# Patient Record
Sex: Male | Born: 1970
Health system: Southern US, Community
[De-identification: ages and names within clinical notes are randomized; demographics above are authoritative.]

## PROBLEM LIST (undated history)

## (undated) DIAGNOSIS — E785 Hyperlipidemia, unspecified: Secondary | ICD-10-CM

## (undated) DIAGNOSIS — I10 Essential (primary) hypertension: Secondary | ICD-10-CM

## (undated) DIAGNOSIS — J40 Bronchitis, not specified as acute or chronic: Secondary | ICD-10-CM

## (undated) DIAGNOSIS — G4730 Sleep apnea, unspecified: Secondary | ICD-10-CM

## (undated) DIAGNOSIS — Z95810 Presence of automatic (implantable) cardiac defibrillator: Secondary | ICD-10-CM

## (undated) DIAGNOSIS — F329 Major depressive disorder, single episode, unspecified: Secondary | ICD-10-CM

## (undated) DIAGNOSIS — U071 COVID-19: Secondary | ICD-10-CM

## (undated) DIAGNOSIS — I509 Heart failure, unspecified: Secondary | ICD-10-CM

## (undated) DIAGNOSIS — H9193 Unspecified hearing loss, bilateral: Secondary | ICD-10-CM

## (undated) DIAGNOSIS — E119 Type 2 diabetes mellitus without complications: Secondary | ICD-10-CM

## (undated) DIAGNOSIS — N289 Disorder of kidney and ureter, unspecified: Secondary | ICD-10-CM

## (undated) DIAGNOSIS — N186 End stage renal disease: Secondary | ICD-10-CM

## (undated) DIAGNOSIS — K859 Acute pancreatitis without necrosis or infection, unspecified: Secondary | ICD-10-CM

## (undated) DIAGNOSIS — I428 Other cardiomyopathies: Secondary | ICD-10-CM

## (undated) DIAGNOSIS — F32A Depression, unspecified: Secondary | ICD-10-CM

## (undated) HISTORY — PX: CARDIAC CATHETERIZATION: SHX172

## (undated) HISTORY — PX: BACK SURGERY: SHX140

## (undated) HISTORY — DX: Other cardiomyopathies: I42.8

## (undated) HISTORY — DX: Sleep apnea, unspecified: G47.30

## (undated) HISTORY — PX: WRIST SURGERY: SHX841

## (undated) HISTORY — DX: Presence of automatic (implantable) cardiac defibrillator: Z95.810

## (undated) HISTORY — DX: Unspecified hearing loss, bilateral: H91.93

## (undated) NOTE — *Deleted (*Deleted)
***In Progress*** PCP: Cammie Sickle Cardiology: Dr. Aundra Dubin  HPI:  10 y.o. with deafness, HTN, DM, and chronic systolic CHF presents for followup of CHF.  Patient had no known cardiac problems until 10/16.  At that time, he developed exertional dyspnea and was admitted to a hospital in [place], [place] with acute systolic CHF.  Echo 10/16 showed EF 15-20%.  Cardiac cath showed no significant coronary disease.  He was diuresed and started on cardiac meds. Subsequently, he moved to [facility].  Now working in a [employer].  Repeat echo in 5/18 showed persistently low EF, 25-30%.  He had [place] placed.   He was admitted briefly with CHF exacerbation in 8/20.   Echo in 9/20 showed EF 30-35% with diffuse hypokinesis.   Ultrasound in 4/21 showed right leg DVT and CT showed a PE.  No obvious trigger (no long trip, no surgery, no prolonged immobility).  He was started on apixaban.   He quit taking his meds in 8/21 and was drinking heavily due to depression, later restarted meds.  In 10/21, he was admitted with DKA and atrial fibrillation/RVR (1st known episode).  He converted to NSR spontaneously.  [name] was stopped due to AKI.  He had melena/hematochezia and EGD showed gastritis/esophagitis.  Apixaban was stopped.  He went home but then was re-admitted later in 10/21 with bilateral PEs.   Echo in 10/21 showed EF 45%, mild global hypokinesis, moderate LVH, mildly decreased RV systolic function.   Recently presented to HF Clinic on 02/20/20 with APP Clinic. Was seen the previous week by Dr. Aundra Dubin on 02/13/20 and was found to be fluid overloaded. Weight was up 4 lbs and he was symptomatic w/ exertional dyspnea and orthopnea. [name] was restarted at 24-26 mg BID and furosemide was increased to 40 mg BID. He presented back to clinic for follow up. Sign language interpreter was used.  He reported some improvement in symptoms, able to walk on flat ground w/o dyspnea but still struggled  up inclines. LEE had resolved. Weight was down 7 lbs and His ReDs clip reading was WNL at 32%. BP was well controlled at 114/78. He was tolerating Entresto ok. He had occasional mild positional dizziness when he stands up but quickly resolves. No syncope/ near syncope.   Today he returns to HF clinic for pharmacist medication titration. At last visit with APP, Furosemide was decreased to 40 mg every AM and 20 mg every PM due to Scr bump on labs. Repeat labs were obtained 2 weeks later and Scr continued to rise, suggesting dehydration, so furosemide was decreased to 40 mg daily and Entresto was discontinued.     HF Medications: Carvedilol 25 mg BID Digoxin 0.125 mg daily Hydralazine 37.5 mg TID Isosorbide mononitrate 30 mg daily Furosemide 40 mg daily  Has the patient been experiencing any side effects to the medications prescribed?  Yes - has been dizzy and fatigued since restarting HF medications. Also complains of nausea.   Does the patient have any problems obtaining medications due to transportation or finances?   No - has [employer]  Understanding of regimen: fair Understanding of indications: fair Potential of compliance: fair Patient understands to avoid NSAIDs. Patient understands to avoid decongestants.    Pertinent Lab Values: . Serum creatinine 1.63, BUN 18, Potassium 4.5, Sodium 133  Vital Signs: . Weight: 217.4 lbs (last clinic weight: 229.8 lbs) . Blood pressure: 104/78  . Heart rate: 84   Assessment: 1. Chronic systolic CHF: Nonischemic cardiomyopathy, etiology uncertain.  Consider prior ETOH or HTN.  He has a [place].  cMRI in 1/18 with EF 27%, RV normal, no evidence of infiltrative disease, myocarditis, or MI.  Echo in 9/20 showed EF 30-35%.  Echo in 10/21 showed EF up to 45%.  - NYHA II, euvolemic on exam - Continue Entresto 24-26 mg bid. Will hold off on titration w/ soft BP and positional dizziness - Continue furosemide 40 mg daily.  -  Continue carvedilol 25 mg BID      - He is off spironolactone for now with prior hyperkalemia.  May consider rechallenge in future with use of Lokelma or Veltassa.  - Continue hydralazine 37.5 mg TID and Imdur 30 daily. - avoid SGLT2i for now given Hgb A1c >10 (11.0 on 02/01/20) and recent admission for DKA  2. Type II diabetes: management per PCP.  On insulin. Would benefit from SGLT2 inhibitor in the future if his Hgb A1c drops below 10. Would avoid for now given increased risk for GU infections and recent admission for DKA 3. HTN: controlled on current regimen.  - No changes made today  4. Deafness: Sign language interpreter present at visit.  5. CKD: Stage 3.  Last SCr 2.68.  6. PE/DVT: 4/21.  Unclear cause, no prolonged trip, prolonged immobility, or history of malignancy. No FH clotting. Recurrent PE in 10/21 while Eliquis on hold with bleeding from esophagitis/gastritis.  Do no think this was an Eliquis failure.  - Continue Eliquis, think he will need long-term with no clear trigger for VTE.  7. Hyperlipidemia: LDL very high when last checked, not clear that he had been taking statin.  Goal LDL < 70 with diabetes.  - continue atorvastatin 80 mg daily (paramedicine now helping w/ med compliance)  - repeat FLP in 6 weeks, if not at goal add Zetia +/- [id number]  8. Atrial fibrillation: First noted in setting of DKA in 10/21.  He is now on amiodarone and is in NSR today.  - Continue amiodarone  200 mg daily.  - Not ideal to continue amiodarone long-term, will likely stop in the future if no further AF and arrange for ablation if it recurs.  - Continue Eliquis.  9. ETOH abuse: Encouraged him to quit.  ETOH may play a role in both cardiomyopathy and atrial fibrillation.   10. Noncompliance: Significant problems with noncompliance around medication use.   - paramedicine now following, appreciate their assistance    Plan: 1) Medication changes: Based on clinical presentation, vital signs and  recent labs will ***  2) Follow-up: ***   Audry Riles, PharmD, BCPS, BCCP, CPP Heart Failure Clinic Pharmacist [phone]

---

## 2011-05-12 DIAGNOSIS — F419 Anxiety disorder, unspecified: Secondary | ICD-10-CM | POA: Insufficient documentation

## 2011-05-12 DIAGNOSIS — E785 Hyperlipidemia, unspecified: Secondary | ICD-10-CM | POA: Insufficient documentation

## 2011-05-12 DIAGNOSIS — E781 Pure hyperglyceridemia: Secondary | ICD-10-CM | POA: Insufficient documentation

## 2011-05-12 DIAGNOSIS — G4733 Obstructive sleep apnea (adult) (pediatric): Secondary | ICD-10-CM | POA: Insufficient documentation

## 2011-10-25 DIAGNOSIS — Z8619 Personal history of other infectious and parasitic diseases: Secondary | ICD-10-CM | POA: Insufficient documentation

## 2011-12-19 DIAGNOSIS — E559 Vitamin D deficiency, unspecified: Secondary | ICD-10-CM | POA: Insufficient documentation

## 2015-01-21 DIAGNOSIS — J40 Bronchitis, not specified as acute or chronic: Secondary | ICD-10-CM | POA: Insufficient documentation

## 2015-01-21 DIAGNOSIS — E785 Hyperlipidemia, unspecified: Secondary | ICD-10-CM | POA: Insufficient documentation

## 2015-12-13 ENCOUNTER — Telehealth: Payer: Self-pay | Admitting: Cardiology

## 2015-12-13 ENCOUNTER — Emergency Department (HOSPITAL_COMMUNITY)
Admission: EM | Admit: 2015-12-13 | Discharge: 2015-12-13 | Disposition: A | Discharge status: Home / Self Care | Payer: Medicaid - Out of State | Attending: Emergency Medicine | Admitting: Emergency Medicine

## 2015-12-13 ENCOUNTER — Emergency Department (HOSPITAL_COMMUNITY): Payer: Medicaid - Out of State

## 2015-12-13 ENCOUNTER — Encounter (HOSPITAL_COMMUNITY): Payer: Self-pay | Admitting: Emergency Medicine

## 2015-12-13 DIAGNOSIS — I509 Heart failure, unspecified: Secondary | ICD-10-CM | POA: Insufficient documentation

## 2015-12-13 DIAGNOSIS — M7989 Other specified soft tissue disorders: Secondary | ICD-10-CM | POA: Diagnosis present

## 2015-12-13 DIAGNOSIS — Z79899 Other long term (current) drug therapy: Secondary | ICD-10-CM | POA: Diagnosis not present

## 2015-12-13 DIAGNOSIS — Z7982 Long term (current) use of aspirin: Secondary | ICD-10-CM | POA: Insufficient documentation

## 2015-12-13 DIAGNOSIS — E1165 Type 2 diabetes mellitus with hyperglycemia: Secondary | ICD-10-CM | POA: Insufficient documentation

## 2015-12-13 DIAGNOSIS — R739 Hyperglycemia, unspecified: Secondary | ICD-10-CM

## 2015-12-13 DIAGNOSIS — R600 Localized edema: Secondary | ICD-10-CM | POA: Diagnosis not present

## 2015-12-13 DIAGNOSIS — I110 Hypertensive heart disease with heart failure: Secondary | ICD-10-CM | POA: Diagnosis not present

## 2015-12-13 HISTORY — DX: Type 2 diabetes mellitus without complications: E11.9

## 2015-12-13 HISTORY — DX: Essential (primary) hypertension: I10

## 2015-12-13 HISTORY — DX: Heart failure, unspecified: I50.9

## 2015-12-13 HISTORY — DX: Hyperlipidemia, unspecified: E78.5

## 2015-12-13 HISTORY — DX: Bronchitis, not specified as acute or chronic: J40

## 2015-12-13 LAB — CBC WITH DIFFERENTIAL/PLATELET
BASOS ABS: 0 10*3/uL (ref 0.0–0.1)
Basophils Relative: 0 %
Eosinophils Absolute: 0.1 10*3/uL (ref 0.0–0.7)
Eosinophils Relative: 2 %
HEMATOCRIT: 34.8 % — AB (ref 39.0–52.0)
HEMOGLOBIN: 11.3 g/dL — AB (ref 13.0–17.0)
LYMPHS PCT: 30 %
Lymphs Abs: 1.4 10*3/uL (ref 0.7–4.0)
MCH: 28.3 pg (ref 26.0–34.0)
MCHC: 32.5 g/dL (ref 30.0–36.0)
MCV: 87 fL (ref 78.0–100.0)
Monocytes Absolute: 0.4 10*3/uL (ref 0.1–1.0)
Monocytes Relative: 8 %
NEUTROS ABS: 2.8 10*3/uL (ref 1.7–7.7)
Neutrophils Relative %: 60 %
Platelets: 219 10*3/uL (ref 150–400)
RBC: 4 MIL/uL — ABNORMAL LOW (ref 4.22–5.81)
RDW: 14.2 % (ref 11.5–15.5)
WBC: 4.6 10*3/uL (ref 4.0–10.5)

## 2015-12-13 LAB — COMPREHENSIVE METABOLIC PANEL
ALK PHOS: 75 U/L (ref 38–126)
ALT: 56 U/L (ref 17–63)
AST: 40 U/L (ref 15–41)
Albumin: 3.7 g/dL (ref 3.5–5.0)
Anion gap: 9 (ref 5–15)
BILIRUBIN TOTAL: 0.6 mg/dL (ref 0.3–1.2)
BUN: 12 mg/dL (ref 6–20)
CALCIUM: 8.6 mg/dL — AB (ref 8.9–10.3)
CO2: 24 mmol/L (ref 22–32)
CREATININE: 0.92 mg/dL (ref 0.61–1.24)
Chloride: 108 mmol/L (ref 101–111)
GFR calc Af Amer: >60 mL/min (ref >60)
Glucose, Bld: 329 mg/dL — ABNORMAL HIGH (ref 65–99)
POTASSIUM: 4.5 mmol/L (ref 3.5–5.1)
Sodium: 141 mmol/L (ref 135–145)
TOTAL PROTEIN: 6.6 g/dL (ref 6.5–8.1)

## 2015-12-13 LAB — I-STAT TROPONIN, ED: TROPONIN I, POC: 0.03 ng/mL (ref 0.00–0.08)

## 2015-12-13 LAB — BRAIN NATRIURETIC PEPTIDE: B Natriuretic Peptide: 476.8 pg/mL — ABNORMAL HIGH (ref 0.0–100.0)

## 2015-12-13 MED ORDER — POTASSIUM CHLORIDE ER 10 MEQ PO TBCR: 10.0000 meq | EXTENDED_RELEASE_TABLET | Freq: Every day | ORAL | 1 refills | Status: DC

## 2015-12-13 MED ORDER — METFORMIN HCL 500 MG PO TABS: 500.0000 mg | ORAL_TABLET | Freq: Two times a day (BID) | ORAL | 1 refills | Status: DC

## 2015-12-13 MED ORDER — FUROSEMIDE 20 MG PO TABS: 20.0000 mg | ORAL_TABLET | Freq: Every day | ORAL | 1 refills | Status: DC

## 2015-12-13 MED ORDER — FUROSEMIDE 10 MG/ML IJ SOLN
60.0000 mg | Freq: Once | INTRAMUSCULAR | Status: AC
  Administered 2015-12-13: 60 mg via INTRAVENOUS
  Filled 2015-12-13: qty 8

## 2015-12-13 MED ORDER — LISINOPRIL 2.5 MG PO TABS: 2.5000 mg | ORAL_TABLET | Freq: Every day | ORAL | 1 refills | Status: DC

## 2015-12-13 MED ORDER — CARVEDILOL 3.125 MG PO TABS: 3.1250 mg | ORAL_TABLET | Freq: Two times a day (BID) | ORAL | 1 refills | Status: DC

## 2015-12-13 NOTE — ED Notes (Signed)
Pt was walked through hall with pulse oximeter on, read 100% sats whole time.

## 2015-12-13 NOTE — ED Provider Notes (Signed)
[facility] DEPT Provider Note   CSN: [id number] Arrival date & time: 12/13/15  1459     History   Chief Complaint Chief Complaint  Patient presents with  . Leg Swelling    HPI [facility] is a 45 y.o. male.  HPI [facility] is a 45 y.o. male with history of congestive heart failure diabetes, hypertension, presents to emergency department complaining of shortness of breath and leg swelling. Patient states that he recently moved to [place] from [place], approximately 3 months ago, since then he has been out of his medications. He does not have a primary care doctor in this area at this time. He states he hasn't noticed any swelling to his legs until this afternoon when he got back from church. He has noticed some shortness of breath and difficulty laying down flat for the last several weeks. He denies any chest pain. No fever or chills. No pain in his legs. No injuries. Has not tried any treatment prior to coming in.  Past Medical History:  Diagnosis Date  . Bronchitis   . CHF (congestive heart failure) ([place])   . Diabetes mellitus without complication ([place])   . Dyslipidemia   . Hypertension     There are no active problems to display for this patient.   No past surgical history on file.     Home Medications    Prior to Admission medications   Not on File    Family History No family history on file.  Social History Social History  Substance Use Topics  . Smoking status: Not on file  . Smokeless tobacco: Not on file  . Alcohol use Not on file     Allergies   Review of patient's allergies indicates no known allergies.   Review of Systems Review of Systems  Constitutional: Negative for chills and fever.  Respiratory: Positive for shortness of breath. Negative for cough and chest tightness.   Cardiovascular: Positive for leg swelling. Negative for chest pain and palpitations.  Gastrointestinal: Negative for abdominal distention, abdominal pain, diarrhea,  nausea and vomiting.  Genitourinary: Negative for dysuria, frequency, hematuria and urgency.  Musculoskeletal: Negative for arthralgias, myalgias, neck pain and neck stiffness.  Skin: Negative for rash.  Allergic/Immunologic: Negative for immunocompromised state.  Neurological: Negative for dizziness, weakness, light-headedness, numbness and headaches.  All other systems reviewed and are negative.    Physical Exam Updated Vital Signs BP 136/100 (BP Location: Left Arm)   Pulse 113   Temp 99.1 F (37.3 C) (Oral)   Resp 18   SpO2 96%   Physical Exam  Constitutional: He appears well-developed and well-nourished. No distress.  HENT:  Head: Normocephalic and atraumatic.  Eyes: Conjunctivae are normal.  Neck: Neck supple.  Cardiovascular: Normal rate, regular rhythm and normal heart sounds.   Pulmonary/Chest: Effort normal. No respiratory distress. He has no wheezes. He has no rales.  Abdominal: Bowel sounds are normal. There is no tenderness. There is no rebound.  Musculoskeletal:  2+ pitting edema up to the knees bilaterally. DP pulses intact. No erythema or warmth to the touch.   Neurological: He is alert.  Skin: Skin is warm and dry.  Nursing note and vitals reviewed.    ED Treatments / Results  Labs (all labs ordered are listed, but only abnormal results are displayed) Labs Reviewed  CBC WITH DIFFERENTIAL/PLATELET - Abnormal; Notable for the following:       Result Value   RBC 4.00 (*)    Hemoglobin 11.3 (*)  HCT 34.8 (*)    All other components within normal limits  COMPREHENSIVE METABOLIC PANEL - Abnormal; Notable for the following:    Glucose, Bld 329 (*)    Calcium 8.6 (*)    All other components within normal limits  BRAIN NATRIURETIC PEPTIDE - Abnormal; Notable for the following:    B Natriuretic Peptide 476.8 (*)    All other components within normal limits  I-STAT TROPOININ, ED    EKG  EKG Interpretation None       Radiology Dg Chest 2  View  Result Date: 12/13/2015 CLINICAL DATA:  Shortness of breath. Lower extremity swelling worsening today. Congestive heart failure. EXAM: CHEST  2 VIEW COMPARISON:  None. FINDINGS: Low lung volumes demonstrated. Mild cardiomegaly noted. No evidence of pulmonary infiltrate or edema. No evidence of pleural effusion. IMPRESSION: Low lung volumes and mild cardiomegaly.  No active lung disease. Electronically Signed   By: Earle Gell M.D.   On: 12/13/2015 16:49    Procedures Procedures (including critical care time)  Medications Ordered in ED Medications  furosemide (LASIX) injection 60 mg (60 mg Intravenous Given 12/13/15 1737)     Initial Impression / Assessment and Plan / ED Course  I have reviewed the triage vital signs and the nursing notes.  Pertinent labs & imaging results that were available during my care of the patient were reviewed by me and considered in my medical decision making (see chart for details).  Clinical Course  Comment By Time  Pt with most likely CHF exacerbation. Off of his meds for about 3 days. Will check labs, BNP, CXR. Will order lasix.  Jeannett Senior, PA-C 09/03 1525  Went to reassess patient, he is just now receiving his Lasix. Will reassess in several minutes. Jeannett Senior, PA-C 09/03 1746  Patient's blood work showed elevated BNP of 476, glucose 329, otherwise no significant abnormalities. He is urinating frequently after the Lasix. He is in no respiratory distress. He was ambulated in the hallway, oxygen remained 100% on room air. I will call cardiology and see if he can get into CHF clinic. We'll restart his medications. Jeannett Senior, PA-C 09/03 1847   Spoke with Dr. Radford Pax with cardiology, they will arrange follow up. I will refill pt's metforin, lasix, carvedilol, lisinopril. Have pt follow up with primary care doctor for hyperglycemia and htn. Follow up with cardiology regarding fluid overload. Return precautions discussed.   Sign language  interpreter used during this visit.   Final Clinical Impressions(s) / ED Diagnoses   Final diagnoses:  Bilateral lower extremity edema  Acute on chronic congestive heart failure, unspecified congestive heart failure type ([place])  Hyperglycemia    New Prescriptions Discharge Medication List as of 12/13/2015  7:45 PM    START taking these medications   Details  !! carvedilol (COREG) 3.125 MG tablet Take 1 tablet (3.125 mg total) by mouth 2 (two) times daily with a meal., Starting Sun 12/13/2015, Print    !! furosemide (LASIX) 20 MG tablet Take 1 tablet (20 mg total) by mouth daily., Starting Sun 12/13/2015, Print    !! lisinopril (PRINIVIL,ZESTRIL) 2.5 MG tablet Take 1 tablet (2.5 mg total) by mouth daily., Starting Sun 12/13/2015, Print    metFORMIN (GLUCOPHAGE) 500 MG tablet Take 1 tablet (500 mg total) by mouth 2 (two) times daily with a meal., Starting Sun 12/13/2015, Print    potassium chloride (K-DUR) 10 MEQ tablet Take 1 tablet (10 mEq total) by mouth daily., Starting Sun 12/13/2015, Print     !! -  Potential duplicate medications found. Please discuss with provider.       Jeannett Senior, PA-C 12/13/15 2024    Julianne Rice, MD 12/13/15 [phone]

## 2015-12-13 NOTE — Telephone Encounter (Signed)
Patient seen in ER with volume overload.  Patient has just moved from [place] to [facility] and has not gotten his meds filled in 3 months.  ER MD gave him IV diuretics and discharged on prior home meds for CHF and PO Lasix.  Patient instructed to call if weight and swelling do not decrease or if he gains more than 3 lbs in 1 day or 5 lbs in 1 week.  Please get patient into CHF clinic ASAP

## 2015-12-13 NOTE — Discharge Instructions (Signed)
Restart all your medications. Weigh yourself daily. If continue to gain weight, return to ER. You will be contacted on Tuesday by cardiology for follow uppointment for your heart failure. Please try to get a primary care doctor to manage your high blood sugar.

## 2015-12-13 NOTE — ED Triage Notes (Addendum)
Pt states that he has had leg swelling today and has not been taking his medication for his CHF. Pt also reports SOB. Alert and oriented. Pt is deaf.

## 2015-12-15 NOTE — Telephone Encounter (Signed)
Forwarded to CHF Clinic for scheduling.

## 2015-12-17 ENCOUNTER — Encounter (HOSPITAL_COMMUNITY): Payer: Self-pay

## 2015-12-17 ENCOUNTER — Ambulatory Visit (HOSPITAL_COMMUNITY)
Admission: RE | Admit: 2015-12-17 | Discharge: 2015-12-17 | Disposition: A | Discharge status: Home / Self Care | Payer: Medicaid - Out of State | Source: Ambulatory Visit | Attending: Cardiology | Admitting: Cardiology

## 2015-12-17 ENCOUNTER — Encounter (HOSPITAL_COMMUNITY): Payer: Self-pay | Admitting: *Deleted

## 2015-12-17 VITALS — BP 110/74 | HR 91 | Wt 217.0 lb

## 2015-12-17 DIAGNOSIS — I110 Hypertensive heart disease with heart failure: Secondary | ICD-10-CM | POA: Diagnosis not present

## 2015-12-17 DIAGNOSIS — E785 Hyperlipidemia, unspecified: Secondary | ICD-10-CM | POA: Insufficient documentation

## 2015-12-17 DIAGNOSIS — E119 Type 2 diabetes mellitus without complications: Secondary | ICD-10-CM | POA: Insufficient documentation

## 2015-12-17 DIAGNOSIS — Z7984 Long term (current) use of oral hypoglycemic drugs: Secondary | ICD-10-CM | POA: Insufficient documentation

## 2015-12-17 DIAGNOSIS — I5022 Chronic systolic (congestive) heart failure: Secondary | ICD-10-CM | POA: Diagnosis present

## 2015-12-17 DIAGNOSIS — H9190 Unspecified hearing loss, unspecified ear: Secondary | ICD-10-CM | POA: Insufficient documentation

## 2015-12-17 DIAGNOSIS — Z7982 Long term (current) use of aspirin: Secondary | ICD-10-CM | POA: Diagnosis not present

## 2015-12-17 MED ORDER — SPIRONOLACTONE 25 MG PO TABS: 12.5000 mg | ORAL_TABLET | Freq: Every day | ORAL | 3 refills | Status: DC

## 2015-12-17 MED ORDER — FUROSEMIDE 40 MG PO TABS: 40.0000 mg | ORAL_TABLET | Freq: Every day | ORAL | 3 refills | Status: DC

## 2015-12-17 NOTE — Progress Notes (Signed)
CSW met with patient in the clinic with sign interpreter as patient is deaf. Patient reports he recently moved from [place] with his family. Patient reports he has been receiving SSD and will become Medicare eligible in November 2017.  Patient currently has [place] and is hopeful to change to [facility] as he plans to remain here. Patient is working a part time job in [employer] at [employer]. Patient also requesting information on PCP options. CSW provided information on Medicaid, Social Services and referred patient to Internal Medicine Clinic for PCP. CSW also discussed Medicare and encouraged patient to contact Medicare and begin the process to sign up for benefits. Patient acknowledged understanding via interpreter. CSW provided card for future need and will be available as needed. Raquel Sarna, LCSW [phone]

## 2015-12-17 NOTE — Patient Instructions (Signed)
Increase Furosemide (Lasix) to 40 mg daily, we have sent you in a new prescription for 40 mg tablets  Start Spironolactone 12.5 mg (1/2 tab) daily  Labs in 1 week  Your physician has requested that you have a cardiac MRI. Cardiac MRI uses a computer to create images of your heart as its beating, producing both still and moving pictures of your heart and major blood vessels. For further information please visit [email or web address] Please follow the instruction sheet given to you today for more information.  Once this is approved by insurance we will contact you schedule  Your physician recommends that you schedule a follow-up appointment in: 2 weeks

## 2015-12-17 NOTE — Progress Notes (Signed)
PCP: None Cardiology: Dr. Aundra Dubin  45 yo with deafness, HTN, DM, and chronic systolic CHF presents to establish cardiology care.  Patient had no known cardiac problems until 10/16.  At that time, he developed exertional dyspnea and was admitted to a hospital in [place], [place] with acute systolic CHF.  Echo 10/16 showed EF 15-20%.  Cardiac cath showed no significant coronary disease.  He was diuresed and started on cardiac meds.   Subsequently, he moved to [facility].  Now working in a [employer].  He ran out of his meds and developed increased dyspnea.  Therefore, went to ER earlier this month.  Meds were refilled. Currently, he reports dyspnea walking 5 minutes on flat ground or walking stairs or a hill.  He wakes up from sleep short of breath but denies orthopnea.  No chest pain.  No lightheadedess or palpitations.    Of note, needs sign language interpreter.   ECG: NSR, LAE, poor RWP, narrow QRS  Labs (9/17): BNP 477, K 4.5, creatinine 0.92  PMH: 1. HTN 2. Diabetes 3. Hyperlipidemia 4. Deafness since age 34: Needs sign language interpreter.  5. Chronic systolic CHF: Nonischemic cardiomyopathy.  Diagnosis 10/16 in [place], [place] (admitted with acute systolic CHF).  - Echo (10/16): EF 15-20%, mild LVH, mild MR, normal RV size and systolic function.  - LHC (10/16): No significant CAD.  6. Depression.  SH: From [place] originally.  Lives in [place] until 2017, then moved to [place].  Nonsmoker.  Used to drink 3-4 ETOH beverages/day until 10/16, now stopped. Works in [employer].   FH: No known cardiac disease.   ROS: All systems reviewed and negative except as per HPI.   Current Outpatient Prescriptions  Medication Sig Dispense Refill  . aspirin 81 MG chewable tablet Chew 81 mg by mouth daily.    Marland Kitchen atorvastatin (LIPITOR) 80 MG tablet Take 80 mg by mouth daily.    . carvedilol (COREG) 3.125 MG tablet Take 3.125 mg by mouth 2 (two) times daily with a meal.    . citalopram (CELEXA) 40 MG  tablet Take 40 mg by mouth daily.    . furosemide (LASIX) 40 MG tablet Take 1 tablet (40 mg total) by mouth daily. 30 tablet 3  . lisinopril (PRINIVIL,ZESTRIL) 2.5 MG tablet Take 1 tablet (2.5 mg total) by mouth daily. 30 tablet 1  . metFORMIN (GLUCOPHAGE) 500 MG tablet Take 1 tablet (500 mg total) by mouth 2 (two) times daily with a meal. 60 tablet 1  . potassium chloride (K-DUR) 10 MEQ tablet Take 1 tablet (10 mEq total) by mouth daily. 30 tablet 1  . traZODone (DESYREL) 100 MG tablet Take 100-200 mg by mouth at bedtime as needed for sleep.    Marland Kitchen spironolactone (ALDACTONE) 25 MG tablet Take 0.5 tablets (12.5 mg total) by mouth daily. 15 tablet 3   No current facility-administered medications for this encounter.    BP 110/74   Pulse 91   Wt 217 lb (98.4 kg)   SpO2 93%  General: NAD Neck: JVP 9-10 cm, no thyromegaly or thyroid nodule.  Lungs: Clear to auscultation bilaterally with normal respiratory effort. CV: Nondisplaced PMI.  Heart regular S1/S2, no S3/S4, no murmur.  1+ edema to knees bilaterally.  No carotid bruit.  Normal pedal pulses.  Abdomen: Soft, nontender, no hepatosplenomegaly, no distention.  Skin: Intact without lesions or rashes.  Neurologic: Alert and oriented x 3.  Psych: Normal affect. Extremities: No clubbing or cyanosis.  HEENT: Normal.   Assessment/Plan: 1.  Chronic systolic CHF: Nonischemic cardiomyopathy, etiology uncertain.  Consider prior ETOH use versus viral myocarditis.  NYHA class III symptoms.  On exam, he is volume overloaded.  - I will arrange cardiac MRI to reassess EF and to look for infiltrative disease.   - Continue Coreg and lisinopril at current doses.  - Increase Lasix to 40 mg daily and add spironolactone 12.5 daily.  BMET in 1 week, followup in 2 wks.  - Will need titration of cardiac meds as tolerated by BP, then will need repeat echo and consideration for ICD if EF remains low.  - Will need CPX when volume status optimized.  - Need to check  HIV, SPEP.  2. Type II diabetes: I will have our social worker help him arrange a PCP.  3. HTN: BP is not elevated.  4. Deafness: He will need a sign language interpreter scheduled for his followup visits.   Loralie Champagne 12/18/2015

## 2015-12-25 ENCOUNTER — Ambulatory Visit (HOSPITAL_COMMUNITY)
Admission: RE | Admit: 2015-12-25 | Discharge: 2015-12-25 | Disposition: A | Discharge status: Home / Self Care | Payer: Medicaid - Out of State | Source: Ambulatory Visit | Attending: Cardiology | Admitting: Cardiology

## 2015-12-25 ENCOUNTER — Encounter (HOSPITAL_COMMUNITY): Payer: Self-pay | Admitting: Vascular Surgery

## 2015-12-25 ENCOUNTER — Telehealth (HOSPITAL_COMMUNITY): Payer: Self-pay | Admitting: Infectious Diseases

## 2015-12-25 DIAGNOSIS — I5022 Chronic systolic (congestive) heart failure: Secondary | ICD-10-CM | POA: Diagnosis not present

## 2015-12-25 LAB — BRAIN NATRIURETIC PEPTIDE: B Natriuretic Peptide: 1009.5 pg/mL — ABNORMAL HIGH (ref 0.0–100.0)

## 2015-12-25 LAB — BASIC METABOLIC PANEL
ANION GAP: 6 (ref 5–15)
BUN: 15 mg/dL (ref 6–20)
CALCIUM: 9 mg/dL (ref 8.9–10.3)
CO2: 26 mmol/L (ref 22–32)
Chloride: 103 mmol/L (ref 101–111)
Creatinine, Ser: 1.1 mg/dL (ref 0.61–1.24)
GLUCOSE: 301 mg/dL — AB (ref 65–99)
POTASSIUM: 4.2 mmol/L (ref 3.5–5.1)
Sodium: 135 mmol/L (ref 135–145)

## 2015-12-25 NOTE — Telephone Encounter (Signed)
Provided patient with instructions based on his labs. Verified he is taking his medications as prescribed (12.5 mg Arlyce Harman and 40mg  Lasix QD). He reports he is feeling 'ok but not great. Can do a little more walking than he has in the past but is dragging some.' He asked what the plan is for after the next 4 days - advised him that he has an appointment next week on 12/31/15 and as long as he does not feel worse this will help to decrease the stress on his heart from fluid and make him feel better. Dr. Aundra Dubin will reassess him next week and see how increasing his diuretics will help. Verbalized understanding of the instructions.

## 2015-12-31 ENCOUNTER — Encounter (HOSPITAL_COMMUNITY): Payer: Self-pay | Admitting: *Deleted

## 2015-12-31 ENCOUNTER — Ambulatory Visit (HOSPITAL_COMMUNITY)
Admission: RE | Admit: 2015-12-31 | Discharge: 2015-12-31 | Disposition: A | Discharge status: Home / Self Care | Payer: Medicaid - Out of State | Source: Ambulatory Visit | Attending: Cardiology | Admitting: Cardiology

## 2015-12-31 VITALS — BP 98/70 | HR 88 | Wt 199.5 lb

## 2015-12-31 DIAGNOSIS — H9190 Unspecified hearing loss, unspecified ear: Secondary | ICD-10-CM | POA: Insufficient documentation

## 2015-12-31 DIAGNOSIS — I5022 Chronic systolic (congestive) heart failure: Secondary | ICD-10-CM | POA: Insufficient documentation

## 2015-12-31 DIAGNOSIS — I110 Hypertensive heart disease with heart failure: Secondary | ICD-10-CM | POA: Insufficient documentation

## 2015-12-31 DIAGNOSIS — E119 Type 2 diabetes mellitus without complications: Secondary | ICD-10-CM | POA: Insufficient documentation

## 2015-12-31 DIAGNOSIS — Z7982 Long term (current) use of aspirin: Secondary | ICD-10-CM | POA: Insufficient documentation

## 2015-12-31 DIAGNOSIS — Z7984 Long term (current) use of oral hypoglycemic drugs: Secondary | ICD-10-CM | POA: Diagnosis not present

## 2015-12-31 DIAGNOSIS — E785 Hyperlipidemia, unspecified: Secondary | ICD-10-CM | POA: Insufficient documentation

## 2015-12-31 DIAGNOSIS — I429 Cardiomyopathy, unspecified: Secondary | ICD-10-CM | POA: Diagnosis not present

## 2015-12-31 LAB — BASIC METABOLIC PANEL
Anion gap: 10 (ref 5–15)
BUN: 17 mg/dL (ref 6–20)
CHLORIDE: 103 mmol/L (ref 101–111)
CO2: 24 mmol/L (ref 22–32)
CREATININE: 1.13 mg/dL (ref 0.61–1.24)
Calcium: 9.1 mg/dL (ref 8.9–10.3)
GFR calc Af Amer: >60 mL/min (ref >60)
GFR calc non Af Amer: >60 mL/min (ref >60)
Glucose, Bld: 246 mg/dL — ABNORMAL HIGH (ref 65–99)
POTASSIUM: 4.5 mmol/L (ref 3.5–5.1)
SODIUM: 137 mmol/L (ref 135–145)

## 2015-12-31 LAB — BRAIN NATRIURETIC PEPTIDE: B NATRIURETIC PEPTIDE 5: 1088.3 pg/mL — AB (ref 0.0–100.0)

## 2015-12-31 MED ORDER — SPIRONOLACTONE 25 MG PO TABS: 25.0000 mg | ORAL_TABLET | Freq: Every day | ORAL | 3 refills | Status: DC

## 2015-12-31 NOTE — Patient Instructions (Signed)
Increase Spironolactone to 25 mg (1 tab) daily  Labs today  Labs in 10 days  Your physician recommends that you schedule a follow-up appointment in: 3-4 weeks

## 2015-12-31 NOTE — Progress Notes (Signed)
PCP: None Cardiology: Dr. Aundra Dubin  45 yo with deafness, HTN, DM, and chronic systolic CHF presents to establish cardiology care.  Patient had no known cardiac problems until 10/16.  At that time, he developed exertional dyspnea and was admitted to a hospital in [place], [place] with acute systolic CHF.  Echo 10/16 showed EF 15-20%.  Cardiac cath showed no significant coronary disease.  He was diuresed and started on cardiac meds. Subsequently, he moved to [facility].  Now working in a [employer].    At last visit, he was volume overloaded and had run out of his meds.  I restarted his meds and increased diuretics.  He is doing much better.  Weight is down 18 lbs.  He can walk for 15 minutes without dyspnea.  He continues to work in [employer], no dyspnea doing his job.  No lightheadedness, orthopnea, PND, or chest pain. BP is soft today at 98/70.    Of note, needs sign language interpreter.   ECG: NSR, LAE, poor RWP, narrow QRS  Labs (9/17): BNP 477, K 4.5 => 4.2, creatinine 0.92 => 1.1, BNP 1009  PMH: 1. HTN 2. Diabetes 3. Hyperlipidemia 4. Deafness since age 75: Needs sign language interpreter.  5. Chronic systolic CHF: Nonischemic cardiomyopathy.  Diagnosis 10/16 in [place], [place] (admitted with acute systolic CHF).  - Echo (10/16): EF 15-20%, mild LVH, mild MR, normal RV size and systolic function.  - LHC (10/16): No significant CAD.  6. Depression.  SH: From [place] originally.  Lives in [place] until 2017, then moved to [place].  Nonsmoker.  Used to drink 3-4 ETOH beverages/day until 10/16, now stopped. Works in [employer].   FH: No known cardiac disease.   ROS: All systems reviewed and negative except as per HPI.   Current Outpatient Prescriptions  Medication Sig Dispense Refill  . aspirin 81 MG chewable tablet Chew 81 mg by mouth daily.    Marland Kitchen atorvastatin (LIPITOR) 80 MG tablet Take 80 mg by mouth daily.    . carvedilol (COREG) 3.125 MG tablet Take 3.125 mg by mouth 2 (two) times  daily with a meal.    . citalopram (CELEXA) 40 MG tablet Take 40 mg by mouth daily.    . furosemide (LASIX) 40 MG tablet Take 40 mg in AM and 20 mg in PM    . lisinopril (PRINIVIL,ZESTRIL) 2.5 MG tablet Take 1 tablet (2.5 mg total) by mouth daily. 30 tablet 1  . metFORMIN (GLUCOPHAGE) 500 MG tablet Take 1 tablet (500 mg total) by mouth 2 (two) times daily with a meal. 60 tablet 1  . potassium chloride (K-DUR) 10 MEQ tablet Take 1 tablet (10 mEq total) by mouth daily. 30 tablet 1  . spironolactone (ALDACTONE) 25 MG tablet Take 1 tablet (25 mg total) by mouth daily. 30 tablet 3  . traZODone (DESYREL) 100 MG tablet Take 100-200 mg by mouth at bedtime as needed for sleep.     No current facility-administered medications for this encounter.    BP 98/70   Pulse 88   Wt 199 lb 8 oz (90.5 kg)   SpO2 100%  General: NAD Neck: JVP 7 cm, no thyromegaly or thyroid nodule.  Lungs: Clear to auscultation bilaterally with normal respiratory effort. CV: Nondisplaced PMI.  Heart regular S1/S2, no S3/S4, no murmur.  No edema.  No carotid bruit.  Normal pedal pulses.  Abdomen: Soft, nontender, no hepatosplenomegaly, no distention.  Skin: Intact without lesions or rashes.  Neurologic: Alert and oriented x 3.  Psych: Normal affect. Extremities: No clubbing or cyanosis.  HEENT: Normal.   Assessment/Plan: 1. Chronic systolic CHF: Nonischemic cardiomyopathy, etiology uncertain.  Consider prior ETOH use versus viral myocarditis.  NYHA class II symptoms. Weight is down and volume looks much better on exam.  - Awaiting cardiac MRI to reassess EF and to look for infiltrative disease.   - Continue Coreg and lisinopril at current doses, no room to titrate.   - Continue Lasix 40 qam/20 qpm.  - Increase spironolactone to 25 mg daily.  BMET 10 days.  - Will need titration of cardiac meds as tolerated by BP, then will need repeat echo and consideration for ICD if EF remains low.  - CPX after next appointment.   - Need  to check HIV, SPEP, draw today.  2. Type II diabetes: Needs PCP, have enlisted Education officer, museum to help.   3. HTN: BP is not elevated.  4. Deafness: He will need a sign language interpreter scheduled for his followup visits.   Followup in 3-4 weeks.   Loralie Champagne 12/31/2015

## 2016-01-01 LAB — PROTEIN ELECTROPHORESIS, SERUM
A/G RATIO SPE: 1.4 (ref 0.7–1.7)
Albumin ELP: 3.7 g/dL (ref 2.9–4.4)
Alpha-1-Globulin: 0.1 g/dL (ref 0.0–0.4)
Alpha-2-Globulin: 0.6 g/dL (ref 0.4–1.0)
Beta Globulin: 1 g/dL (ref 0.7–1.3)
GLOBULIN, TOTAL: 2.7 g/dL (ref 2.2–3.9)
Gamma Globulin: 0.9 g/dL (ref 0.4–1.8)
Total Protein ELP: 6.4 g/dL (ref 6.0–8.5)

## 2016-01-01 LAB — HIV ANTIBODY (ROUTINE TESTING W REFLEX): HIV SCREEN 4TH GENERATION: NONREACTIVE

## 2016-01-06 ENCOUNTER — Telehealth (HOSPITAL_COMMUNITY): Payer: Self-pay | Admitting: *Deleted

## 2016-01-06 NOTE — Telephone Encounter (Signed)
CMRI not covered by out of state medicaid.

## 2016-01-13 ENCOUNTER — Encounter (HOSPITAL_COMMUNITY): Payer: Self-pay | Admitting: Emergency Medicine

## 2016-01-13 ENCOUNTER — Emergency Department (HOSPITAL_COMMUNITY)
Admission: EM | Admit: 2016-01-13 | Discharge: 2016-01-14 | Disposition: A | Discharge status: Home / Self Care | Payer: Medicaid - Out of State | Attending: Emergency Medicine | Admitting: Emergency Medicine

## 2016-01-13 DIAGNOSIS — R112 Nausea with vomiting, unspecified: Secondary | ICD-10-CM

## 2016-01-13 DIAGNOSIS — R197 Diarrhea, unspecified: Secondary | ICD-10-CM

## 2016-01-13 DIAGNOSIS — K529 Noninfective gastroenteritis and colitis, unspecified: Secondary | ICD-10-CM | POA: Insufficient documentation

## 2016-01-13 DIAGNOSIS — E119 Type 2 diabetes mellitus without complications: Secondary | ICD-10-CM | POA: Insufficient documentation

## 2016-01-13 DIAGNOSIS — Z7984 Long term (current) use of oral hypoglycemic drugs: Secondary | ICD-10-CM | POA: Insufficient documentation

## 2016-01-13 DIAGNOSIS — I509 Heart failure, unspecified: Secondary | ICD-10-CM | POA: Insufficient documentation

## 2016-01-13 DIAGNOSIS — Z7982 Long term (current) use of aspirin: Secondary | ICD-10-CM | POA: Insufficient documentation

## 2016-01-13 DIAGNOSIS — I110 Hypertensive heart disease with heart failure: Secondary | ICD-10-CM | POA: Insufficient documentation

## 2016-01-13 DIAGNOSIS — Z79899 Other long term (current) drug therapy: Secondary | ICD-10-CM | POA: Insufficient documentation

## 2016-01-13 HISTORY — DX: Acute pancreatitis without necrosis or infection, unspecified: K85.90

## 2016-01-13 LAB — COMPREHENSIVE METABOLIC PANEL
ALBUMIN: 4.3 g/dL (ref 3.5–5.0)
ALK PHOS: 52 U/L (ref 38–126)
ALT: 24 U/L (ref 17–63)
AST: 23 U/L (ref 15–41)
Anion gap: 8 (ref 5–15)
BILIRUBIN TOTAL: 1.4 mg/dL — AB (ref 0.3–1.2)
BUN: 13 mg/dL (ref 6–20)
CALCIUM: 9.3 mg/dL (ref 8.9–10.3)
CO2: 27 mmol/L (ref 22–32)
CREATININE: 0.88 mg/dL (ref 0.61–1.24)
Chloride: 101 mmol/L (ref 101–111)
GFR calc Af Amer: >60 mL/min (ref >60)
GLUCOSE: 206 mg/dL — AB (ref 65–99)
Potassium: 4.6 mmol/L (ref 3.5–5.1)
Sodium: 136 mmol/L (ref 135–145)
TOTAL PROTEIN: 7.6 g/dL (ref 6.5–8.1)

## 2016-01-13 LAB — LIPASE, BLOOD: Lipase: 22 U/L (ref 11–51)

## 2016-01-13 LAB — CBC
HCT: 36.6 % — ABNORMAL LOW (ref 39.0–52.0)
Hemoglobin: 12.1 g/dL — ABNORMAL LOW (ref 13.0–17.0)
MCH: 27.5 pg (ref 26.0–34.0)
MCHC: 33.1 g/dL (ref 30.0–36.0)
MCV: 83.2 fL (ref 78.0–100.0)
PLATELETS: 210 10*3/uL (ref 150–400)
RBC: 4.4 MIL/uL (ref 4.22–5.81)
RDW: 13.8 % (ref 11.5–15.5)
WBC: 6.8 10*3/uL (ref 4.0–10.5)

## 2016-01-13 LAB — CBG MONITORING, ED: Glucose-Capillary: 214 mg/dL — ABNORMAL HIGH (ref 65–99)

## 2016-01-13 NOTE — ED Triage Notes (Signed)
Pt interviewed by video interrupter  Pt is deaf  Pt is c/o generalized abd pain with nausea and vomiting that started yesterday  Pt states he is unable to hold down any food or water  Pt states his emesis is yellow in color  Pt states he has had pancreatitis in the past and had to be hospitalized for 2 weeks

## 2016-01-14 ENCOUNTER — Emergency Department (HOSPITAL_COMMUNITY): Payer: Medicaid - Out of State

## 2016-01-14 ENCOUNTER — Encounter (HOSPITAL_COMMUNITY): Payer: Self-pay

## 2016-01-14 LAB — URINE MICROSCOPIC-ADD ON: RBC / HPF: NONE SEEN RBC/hpf (ref 0–5)

## 2016-01-14 LAB — URINALYSIS, ROUTINE W REFLEX MICROSCOPIC
GLUCOSE, UA: NEGATIVE mg/dL
HGB URINE DIPSTICK: NEGATIVE
Ketones, ur: NEGATIVE mg/dL
Leukocytes, UA: NEGATIVE
Nitrite: NEGATIVE
Specific Gravity, Urine: 1.031 — ABNORMAL HIGH (ref 1.005–1.030)
pH: 5.5 (ref 5.0–8.0)

## 2016-01-14 MED ORDER — SODIUM CHLORIDE 0.9 % IV BOLUS (SEPSIS)
1000.0000 mL | Freq: Once | INTRAVENOUS | Status: AC
  Administered 2016-01-14: 1000 mL via INTRAVENOUS

## 2016-01-14 MED ORDER — METRONIDAZOLE 500 MG PO TABS: 500.0000 mg | ORAL_TABLET | Freq: Three times a day (TID) | ORAL | 0 refills | Status: DC

## 2016-01-14 MED ORDER — IOPAMIDOL (ISOVUE-300) INJECTION 61%
100.0000 mL | Freq: Once | INTRAVENOUS | Status: AC | PRN
  Administered 2016-01-14: 100 mL via INTRAVENOUS

## 2016-01-14 MED ORDER — CIPROFLOXACIN HCL 500 MG PO TABS: 500.0000 mg | ORAL_TABLET | Freq: Two times a day (BID) | ORAL | 0 refills | Status: DC

## 2016-01-14 MED ORDER — ONDANSETRON HCL 4 MG/2ML IJ SOLN
4.0000 mg | Freq: Once | INTRAMUSCULAR | Status: AC
  Administered 2016-01-14: 4 mg via INTRAVENOUS
  Filled 2016-01-14: qty 2

## 2016-01-14 MED ORDER — PROMETHAZINE HCL 25 MG PO TABS: 25.0000 mg | ORAL_TABLET | Freq: Four times a day (QID) | ORAL | 0 refills | Status: DC | PRN

## 2016-01-14 NOTE — ED Provider Notes (Signed)
[facility] DEPT Provider Note   CSN: [id number] Arrival date & time: 01/13/16  2212  By signing my name below, I, Troy Rush, attest that this documentation has been prepared under the direction and in the presence of Troy Greek, MD. Electronically Signed: Reola Rush, ED Scribe. 01/14/16. 12:17 AM.  History   Chief Complaint Chief Complaint  Patient presents with  . Abdominal Pain   The history is provided by the patient. The history is limited by a language barrier Chief Operating Officer Language). A language interpreter was used.   HPI Comments: Troy Rush is a 45 y.o. male with a PMHx of DM, CHF, and Pancreatitis, who presents to the Emergency Department complaining of diffuse abdominal pain onset ~1 day ago. Pt reports associated nausea and vomiting secondary to his abdominal pain. Pt has a hx of Pancreatitis in the past with prior admission, and he states that his pain today is similar. He additionally notes that his emesis is mostly yellow in color. Pt is unable to tolerate PO intake well because he notes that it will exacerbate his nausea and vomiting. No other associated symptoms or complaints.   Past Medical History:  Diagnosis Date  . Bronchitis   . CHF (congestive heart failure) ([place])   . Diabetes mellitus without complication ([place])   . Dyslipidemia   . Hypertension   . Pancreatitis    There are no active problems to display for this patient.  Past Surgical History:  Procedure Laterality Date  . BACK SURGERY    . CARDIAC CATHETERIZATION    . WRIST SURGERY      Home Medications    Prior to Admission medications   Medication Sig Start Date End Date Taking? Authorizing Provider  aspirin 81 MG chewable tablet Chew 81 mg by mouth daily.   Yes Historical Provider, MD  atorvastatin (LIPITOR) 80 MG tablet Take 80 mg by mouth daily.   Yes Historical Provider, MD  carvedilol (COREG) 3.125 MG tablet Take 3.125 mg by mouth 2 (two) times daily with  a meal.   Yes Historical Provider, MD  citalopram (CELEXA) 40 MG tablet Take 40 mg by mouth daily.   Yes Historical Provider, MD  furosemide (LASIX) 40 MG tablet Take 20-40 mg by mouth 2 (two) times daily. 40MG  in the Am and 20MG  in the PM   Yes Historical Provider, MD  lisinopril (PRINIVIL,ZESTRIL) 2.5 MG tablet Take 1 tablet (2.5 mg total) by mouth daily. 12/13/15  Yes Tatyana Kirichenko, PA-C  metFORMIN (GLUCOPHAGE) 500 MG tablet Take 1 tablet (500 mg total) by mouth 2 (two) times daily with a meal. 12/13/15  Yes Tatyana Kirichenko, PA-C  potassium chloride (K-DUR) 10 MEQ tablet Take 1 tablet (10 mEq total) by mouth daily. 12/13/15  Yes Tatyana Kirichenko, PA-C  spironolactone (ALDACTONE) 25 MG tablet Take 1 tablet (25 mg total) by mouth daily. 12/31/15 03/30/16 Yes Larey Dresser, MD  ciprofloxacin (CIPRO) 500 MG tablet Take 1 tablet (500 mg total) by mouth 2 (two) times daily. 01/14/16   Troy Greek, MD  metroNIDAZOLE (FLAGYL) 500 MG tablet Take 1 tablet (500 mg total) by mouth 3 (three) times daily. 01/14/16   Troy Greek, MD  promethazine (PHENERGAN) 25 MG tablet Take 1 tablet (25 mg total) by mouth every 6 (six) hours as needed for nausea or vomiting. 01/14/16   Troy Greek, MD   Family History Family History  Problem Relation Age of Onset  . Hypertension Mother    Social  History Social History  Substance Use Topics  . Smoking status: Never Smoker  . Smokeless tobacco: Never Used  . Alcohol use Yes     Comment: rare   Allergies   Review of patient's allergies indicates no known allergies.  Review of Systems Review of Systems  Constitutional: Negative for fever.  Gastrointestinal: Positive for abdominal pain, nausea and vomiting.  All other systems reviewed and are negative.  Physical Exam Updated Vital Signs BP 131/90 (BP Location: Right Arm)   Pulse 96   Temp 98.4 F (36.9 C) (Oral)   Resp 20   Ht 6' (1.829 m)   Wt 212 lb (96.2 kg)   SpO2 97%    BMI 28.75 kg/m   Physical Exam  Constitutional: He is oriented to person, place, and time. He appears well-developed and well-nourished. No distress.  HENT:  Head: Normocephalic and atraumatic.  Right Ear: Hearing normal.  Left Ear: Hearing normal.  Nose: Nose normal.  Mouth/Throat: Oropharynx is clear and moist and mucous membranes are normal.  Eyes: Conjunctivae and EOM are normal. Pupils are equal, round, and reactive to light.  Neck: Normal range of motion. Neck supple.  Cardiovascular: Regular rhythm, S1 normal and S2 normal.  Exam reveals no gallop and no friction rub.   No murmur heard. Pulmonary/Chest: Effort normal and breath sounds normal. No respiratory distress. He exhibits no tenderness.  Abdominal: Soft. Normal appearance and bowel sounds are normal. There is no hepatosplenomegaly. There is tenderness. There is no rebound, no guarding, no tenderness at McBurney's point and negative Murphy's sign. No hernia.  Mild diffuse abdominal tenderness.   Musculoskeletal: Normal range of motion.  Neurological: He is alert and oriented to person, place, and time. He has normal strength. No cranial nerve deficit or sensory deficit. Coordination normal. GCS eye subscore is 4. GCS verbal subscore is 5. GCS motor subscore is 6.  Skin: Skin is warm, dry and intact. No rash noted. No cyanosis.  Psychiatric: He has a normal mood and affect. His speech is normal and behavior is normal. Thought content normal.  Nursing note and vitals reviewed.  ED Treatments / Results  DIAGNOSTIC STUDIES: Oxygen Saturation is 100% on RA, normal by my interpretation.   COORDINATION OF CARE: 12:16 AM-Discussed next steps with pt. Pt verbalized understanding and is agreeable with the plan.   Labs (all labs ordered are listed, but only abnormal results are displayed) Labs Reviewed  COMPREHENSIVE METABOLIC PANEL - Abnormal; Notable for the following:       Result Value   Glucose, Bld 206 (*)    Total  Bilirubin 1.4 (*)    All other components within normal limits  CBC - Abnormal; Notable for the following:    Hemoglobin 12.1 (*)    HCT 36.6 (*)    All other components within normal limits  URINALYSIS, ROUTINE W REFLEX MICROSCOPIC (NOT AT [facility]) - Abnormal; Notable for the following:    Color, Urine AMBER (*)    Specific Gravity, Urine 1.031 (*)    Bilirubin Urine SMALL (*)    Protein, ur >300 (*)    All other components within normal limits  URINE MICROSCOPIC-ADD ON - Abnormal; Notable for the following:    Squamous Epithelial / LPF 0-5 (*)    Bacteria, UA RARE (*)    Casts HYALINE CASTS (*)    All other components within normal limits  CBG MONITORING, ED - Abnormal; Notable for the following:    Glucose-Capillary 214 (*)  All other components within normal limits  LIPASE, BLOOD   Radiology Ct Abdomen Pelvis W Contrast  Result Date: 01/14/2016 CLINICAL DATA:  Diffuse abdominal pain. Nausea and vomiting. History of pancreatitis. History of hypertension and diabetes. EXAM: CT ABDOMEN AND PELVIS WITH CONTRAST TECHNIQUE: Multidetector CT imaging of the abdomen and pelvis was performed using the standard protocol following bolus administration of intravenous contrast. CONTRAST:  159mL ISOVUE-300 IOPAMIDOL (ISOVUE-300) INJECTION 61% COMPARISON:  None. FINDINGS: Lower chest: Atelectasis in the lung bases.  Cardiac enlargement. Hepatobiliary: Mild diffuse fatty infiltration of the liver. No focal liver lesions. Gallbladder and bile ducts are unremarkable. Pancreas: Unremarkable. No pancreatic ductal dilatation or surrounding inflammatory changes. Spleen: Normal in size without focal abnormality. Adrenals/Urinary Tract: Adrenal glands are unremarkable. Kidneys are normal, without renal calculi, focal lesion, or hydronephrosis. Bladder is unremarkable. Stomach/Bowel: Stomach and small bowel are decompressed. Scattered stool throughout the colon without distention. Wall thickening and edema  involving the cecum and transverse colon with mild pericolonic infiltration. Appearance is consistent with colitis in may represent infectious or inflammatory etiologies. Appendix is normal. Vascular/Lymphatic: No significant vascular findings are present. No enlarged abdominal or pelvic lymph nodes. Reproductive: Prostate is unremarkable. Other: No abdominal wall hernia or abnormality. No abdominopelvic ascites. Musculoskeletal: No acute or significant osseous findings. IMPRESSION: Wall thickening and edema with pericolonic edema and fluid around the ascending colon and transverse colon consistent with colitis. Consider infectious or inflammatory etiologies or pseudomembranous colitis. No evidence of bowel obstruction. Fatty infiltration of the liver. Electronically Signed   By: Lucienne Capers M.D.   On: 01/14/2016 04:18    Procedures Procedures   Medications Ordered in ED Medications  sodium chloride 0.9 % bolus 1,000 mL (1,000 mLs Intravenous New Bag/Given 01/14/16 0037)  ondansetron (ZOFRAN) injection 4 mg (4 mg Intravenous Given 01/14/16 0041)  iopamidol (ISOVUE-300) 61 % injection 100 mL (100 mLs Intravenous Contrast Given 01/14/16 0333)    Initial Impression / Assessment and Plan / ED Course  I have reviewed the triage vital signs and the nursing notes.  Pertinent labs & imaging results that were available during my care of the patient were reviewed by me and considered in my medical decision making (see chart for details).  Clinical Course   Patient presents to the emergency department for evaluation of nausea, vomiting and diarrhea. Patient reports that symptoms began yesterday. He initially only reported nausea and vomiting, but further questioning reveals that he did have some diarrhea yesterday. He has not been on any recent antibiotics. Lab work was entirely normal. He does report a previous history of pancreatitis. Lipase was normal. CT scan performed to further evaluate. He does  have evidence of colitis. He does not have any C. difficile risk factors. Will treat as infectious colitis, follow-up with GI.  Final Clinical Impressions(s) / ED Diagnoses   Final diagnoses:  Nausea vomiting and diarrhea  Colitis    New Prescriptions New Prescriptions   CIPROFLOXACIN (CIPRO) 500 MG TABLET    Take 1 tablet (500 mg total) by mouth 2 (two) times daily.   METRONIDAZOLE (FLAGYL) 500 MG TABLET    Take 1 tablet (500 mg total) by mouth 3 (three) times daily.   PROMETHAZINE (PHENERGAN) 25 MG TABLET    Take 1 tablet (25 mg total) by mouth every 6 (six) hours as needed for nausea or vomiting.   I personally performed the services described in this documentation, which was scribed in my presence. The recorded information has been reviewed and is accurate.  Troy Greek, MD 01/14/16 [id number]

## 2016-01-14 NOTE — ED Notes (Signed)
Pt is deaf and required WAL[place].  Translation and complete. Called pt's employer [employer] ( ms. Thora Lance [phone]  Talked to carrie and she said ok with him coming back Monday )  Notified employer that pt will not return to work until Monday.  Pt has [facility] vascular app Friday oct 6 ( pt informed of this with interrupter) . Also oct 12   Pt told to call [facility] for appt   tomorrow    Called taxi for pt to go home to [place] dr Letta Kocher

## 2016-01-15 ENCOUNTER — Encounter (HOSPITAL_COMMUNITY): Payer: Self-pay | Admitting: Vascular Surgery

## 2016-01-15 ENCOUNTER — Ambulatory Visit (HOSPITAL_COMMUNITY)
Admission: RE | Admit: 2016-01-15 | Discharge: 2016-01-15 | Disposition: A | Discharge status: Home / Self Care | Payer: Self-pay | Source: Ambulatory Visit | Attending: Cardiology | Admitting: Cardiology

## 2016-01-15 DIAGNOSIS — I5022 Chronic systolic (congestive) heart failure: Secondary | ICD-10-CM | POA: Insufficient documentation

## 2016-01-15 LAB — BASIC METABOLIC PANEL
Anion gap: 13 (ref 5–15)
BUN: 11 mg/dL (ref 6–20)
CALCIUM: 9.6 mg/dL (ref 8.9–10.3)
CO2: 25 mmol/L (ref 22–32)
CREATININE: 1.21 mg/dL (ref 0.61–1.24)
Chloride: 97 mmol/L — ABNORMAL LOW (ref 101–111)
GFR calc Af Amer: >60 mL/min (ref >60)
GLUCOSE: 162 mg/dL — AB (ref 65–99)
Potassium: 4.5 mmol/L (ref 3.5–5.1)
Sodium: 135 mmol/L (ref 135–145)

## 2016-01-21 ENCOUNTER — Encounter (HOSPITAL_COMMUNITY): Payer: Self-pay

## 2016-01-21 ENCOUNTER — Ambulatory Visit (HOSPITAL_COMMUNITY)
Admission: RE | Admit: 2016-01-21 | Discharge: 2016-01-21 | Disposition: A | Discharge status: Home / Self Care | Payer: Medicaid - Out of State | Source: Ambulatory Visit | Attending: Cardiology | Admitting: Cardiology

## 2016-01-21 VITALS — BP 118/90 | HR 87 | Wt 202.0 lb

## 2016-01-21 DIAGNOSIS — E119 Type 2 diabetes mellitus without complications: Secondary | ICD-10-CM | POA: Diagnosis not present

## 2016-01-21 DIAGNOSIS — Z7984 Long term (current) use of oral hypoglycemic drugs: Secondary | ICD-10-CM | POA: Insufficient documentation

## 2016-01-21 DIAGNOSIS — Z7982 Long term (current) use of aspirin: Secondary | ICD-10-CM | POA: Diagnosis not present

## 2016-01-21 DIAGNOSIS — E785 Hyperlipidemia, unspecified: Secondary | ICD-10-CM | POA: Diagnosis not present

## 2016-01-21 DIAGNOSIS — F329 Major depressive disorder, single episode, unspecified: Secondary | ICD-10-CM | POA: Diagnosis not present

## 2016-01-21 DIAGNOSIS — H9193 Unspecified hearing loss, bilateral: Secondary | ICD-10-CM

## 2016-01-21 DIAGNOSIS — H9190 Unspecified hearing loss, unspecified ear: Secondary | ICD-10-CM | POA: Diagnosis not present

## 2016-01-21 DIAGNOSIS — I158 Other secondary hypertension: Secondary | ICD-10-CM

## 2016-01-21 DIAGNOSIS — I429 Cardiomyopathy, unspecified: Secondary | ICD-10-CM | POA: Insufficient documentation

## 2016-01-21 DIAGNOSIS — I5022 Chronic systolic (congestive) heart failure: Secondary | ICD-10-CM | POA: Diagnosis not present

## 2016-01-21 DIAGNOSIS — I428 Other cardiomyopathies: Secondary | ICD-10-CM

## 2016-01-21 DIAGNOSIS — I110 Hypertensive heart disease with heart failure: Secondary | ICD-10-CM | POA: Insufficient documentation

## 2016-01-21 MED ORDER — LOSARTAN POTASSIUM 25 MG PO TABS: 12.5000 mg | ORAL_TABLET | Freq: Every day | ORAL | 3 refills | Status: DC

## 2016-01-21 NOTE — Patient Instructions (Signed)
STOP Lisinopril.  START Losartan 12.5 mg (1/2 tablet) once daily.  Follow up 2 weeks with Amy Clegg NP-C.  Do the following things EVERYDAY: 1) Weigh yourself in the morning before breakfast. Write it down and keep it in a log. 2) Take your medicines as prescribed 3) Eat low salt foods-Limit salt (sodium) to 2000 mg per day.  4) Stay as active as you can everyday 5) Limit all fluids for the day to less than 2 liters

## 2016-01-21 NOTE — Progress Notes (Signed)
PCP: None Cardiology: Dr. Aundra Dubin  45 yo with deafness, HTN, DM, and chronic systolic CHF presents to establish cardiology care.  Patient had no known cardiac problems until 10/16.  At that time, he developed exertional dyspnea and was admitted to a hospital in [place], [place] with acute systolic CHF.  Echo 10/16 showed EF 15-20%.  Cardiac cath showed no significant coronary disease.  He was diuresed and started on cardiac meds. Subsequently, he moved to [place].  Now working in a [employer].    He returns for HF follow up.  Last visit spiro was increased. Over all feeling ok. Mild dyspnea with steps. Denies PND/Orthopnea. Sleeps on 2 pillows. Does not have a scale. Rides the bus to appointments. Works in a [employer]. Works 5 days a week about 6 hours a day. Trying to get insurance from work. Taking all medications.   Of note, needs sign language interpreter.   Labs (9/17): BNP 477, K 4.5 => 4.2, creatinine 0.92 => 1.1, BNP 1009 Labs (12/2015) HIV NR, SPEP negative Labs 01/15/16: K 4.5 Creatinine 1.21   PMH: 1. HTN 2. Diabetes 3. Hyperlipidemia 4. Deafness since age 48: Needs sign language interpreter.  5. Chronic systolic CHF: Nonischemic cardiomyopathy.  Diagnosis 10/16 in [place], [place] (admitted with acute systolic CHF).  - Echo (10/16): EF 15-20%, mild LVH, mild MR, normal RV size and systolic function.  - LHC (10/16): No significant CAD.  6. Depression.  SH: From [place] originally.  Lives in [place] until 2017, then moved to [place].  Nonsmoker.  Used to drink 3-4 ETOH beverages/day until 10/16, now stopped. Works in [employer].   FH: No known cardiac disease.   ROS: All systems reviewed and negative except as per HPI.   Current Outpatient Prescriptions  Medication Sig Dispense Refill  . aspirin 81 MG chewable tablet Chew 81 mg by mouth daily.    Marland Kitchen atorvastatin (LIPITOR) 80 MG tablet Take 80 mg by mouth daily.    . carvedilol (COREG) 3.125 MG tablet Take 3.125 mg by mouth 2 (two)  times daily with a meal.    . ciprofloxacin (CIPRO) 500 MG tablet Take 1 tablet (500 mg total) by mouth 2 (two) times daily. 20 tablet 0  . citalopram (CELEXA) 40 MG tablet Take 40 mg by mouth daily.    . furosemide (LASIX) 40 MG tablet Take 20-40 mg by mouth 2 (two) times daily. 40MG  in the Am and 20MG  in the PM    . lisinopril (PRINIVIL,ZESTRIL) 2.5 MG tablet Take 1 tablet (2.5 mg total) by mouth daily. 30 tablet 1  . metFORMIN (GLUCOPHAGE) 500 MG tablet Take 1 tablet (500 mg total) by mouth 2 (two) times daily with a meal. 60 tablet 1  . metroNIDAZOLE (FLAGYL) 500 MG tablet Take 1 tablet (500 mg total) by mouth 3 (three) times daily. 30 tablet 0  . potassium chloride (K-DUR) 10 MEQ tablet Take 1 tablet (10 mEq total) by mouth daily. 30 tablet 1  . spironolactone (ALDACTONE) 25 MG tablet Take 1 tablet (25 mg total) by mouth daily. 30 tablet 3  . promethazine (PHENERGAN) 25 MG tablet Take 1 tablet (25 mg total) by mouth every 6 (six) hours as needed for nausea or vomiting. (Patient not taking: Reported on 01/21/2016) 30 tablet 0   No current facility-administered medications for this encounter.    BP 118/90 (BP Location: Right Arm, Patient Position: Sitting, Cuff Size: Normal)   Pulse 87   Wt 202 lb (91.6 kg)   SpO2  95%   BMI 27.40 kg/m  General: NAD. Deaf interpreter present  Neck: JVP flat, no thyromegaly or thyroid nodule.  Lungs: Clear to auscultation bilaterally with normal respiratory effort. CV: Nondisplaced PMI.  Heart regular S1/S2, no S3/S4, no murmur.  No edema.  No carotid bruit.  Normal pedal pulses.  Abdomen: Soft, nontender, no hepatosplenomegaly, no distention.  Skin: Intact without lesions or rashes.  Neurologic: Alert and oriented x 3.  Psych: Normal affect. Extremities: No clubbing or cyanosis.  HEENT: Normal.   Assessment/Plan: 1. Chronic systolic CHF: Nonischemic cardiomyopathy, etiology uncertain.  Consider prior ETOH use versus viral myocarditis.  SPEP HIV  negative.  NYHA class II symptoms. Volume status stable. Continue lasix 40 mg/20mg .  Continue low dose carvedilol.  Stop lisinopril due to dry cough. Start losartan 12.5 mg daily. Anticipate starting entresto soon.  Hope to set up CPX and CMRI next visit. He is trying to get insurance.  - Today he was provided a scale and weight chart. He was instructed to weigh and record daily.   2. Type II diabetes: Needs PCP, have enlisted Education officer, museum to help.   3. HTN: Stable.   4. Deafness: He will need a sign language interpreter scheduled for his followup visits.   Follow up in 2 weeks. Anticipate switching to entresto at future visit. Needs CPX and CMRI once he has insurance.   Amy Clegg NP-C  01/21/2016

## 2016-01-21 NOTE — Progress Notes (Signed)
Advanced Heart Failure Medication Review by a Pharmacist  Does the patient  feel that his/her medications are working for him/her?  yes  Has the patient been experiencing any side effects to the medications prescribed?  no  Does the patient measure his/her own blood pressure or blood glucose at home?  yes   Does the patient have any problems obtaining medications due to transportation or finances?   No - no Rx insurance now but will get through his work soon (open enrollment now) and will get Medicare A/B on December 1st  Understanding of regimen: good Understanding of indications: good Potential of compliance: good Patient understands to avoid NSAIDs. Patient understands to avoid decongestants.  Issues to address at subsequent visits: None   Pharmacist comments:  Mr. Heft is a pleasant deaf AAM presenting without a medication list but with good understanding of his regimen including dosages. He reports great compliance with his regimen and did not have any specific medication-related questions or concerns for me at this time.   Ruta Hinds. Velva Harman, PharmD, BCPS, CPP Clinical Pharmacist Pager: [phone] Phone: [phone] 01/21/2016 11:05 AM      Time with patient: 10 minutes Preparation and documentation time: 2 minutes Total time: 12 minutes

## 2016-02-01 ENCOUNTER — Emergency Department (HOSPITAL_COMMUNITY)
Admission: EM | Admit: 2016-02-01 | Discharge: 2016-02-02 | Disposition: A | Discharge status: Home / Self Care | Payer: Medicaid - Out of State | Attending: Emergency Medicine | Admitting: Emergency Medicine

## 2016-02-01 ENCOUNTER — Encounter (HOSPITAL_COMMUNITY): Payer: Self-pay

## 2016-02-01 DIAGNOSIS — Z7982 Long term (current) use of aspirin: Secondary | ICD-10-CM | POA: Diagnosis not present

## 2016-02-01 DIAGNOSIS — Z7984 Long term (current) use of oral hypoglycemic drugs: Secondary | ICD-10-CM | POA: Diagnosis not present

## 2016-02-01 DIAGNOSIS — R1084 Generalized abdominal pain: Secondary | ICD-10-CM | POA: Diagnosis present

## 2016-02-01 DIAGNOSIS — K529 Noninfective gastroenteritis and colitis, unspecified: Secondary | ICD-10-CM | POA: Insufficient documentation

## 2016-02-01 DIAGNOSIS — I110 Hypertensive heart disease with heart failure: Secondary | ICD-10-CM | POA: Diagnosis not present

## 2016-02-01 DIAGNOSIS — E119 Type 2 diabetes mellitus without complications: Secondary | ICD-10-CM | POA: Insufficient documentation

## 2016-02-01 DIAGNOSIS — R195 Other fecal abnormalities: Secondary | ICD-10-CM | POA: Diagnosis not present

## 2016-02-01 DIAGNOSIS — I509 Heart failure, unspecified: Secondary | ICD-10-CM | POA: Diagnosis not present

## 2016-02-01 NOTE — ED Provider Notes (Signed)
[facility] DEPT Provider Note   CSN: [id number] Arrival date & time: 02/01/16  2225  By signing my name below, I, Gwenlyn Fudge, attest that this documentation has been prepared under the direction and in the presence of Shary Decamp, PA-C. Electronically Signed: Gwenlyn Fudge, ED Scribe. 02/01/16. 12:11 AM.  History   Chief Complaint No chief complaint on file.  The history is provided by the patient. A language interpreter was used.   HPI Comments: Troy Rush is a 45 y.o. male with PMHx of CHF, DM, HTN, Pancreatitis and Dyslipidemia who presents to the Emergency Department complaining of gradual onset, constant, 9/10, generalized abdominal pain onset 4 days PTA. Pt was seen in ED on 01/13/2016 with same symptoms and states he started to feel better after taking prescribed antibiotics. After completing the full course the symptoms returned and he states the pain has worsened. He reports associated nausea, vomiting, and fever. Pt has had more episodes of vomiting than diarrhea. No CP/SOB.    On 01/13/2016: Pt received CT, diagnosed with Colitis, referred to Gastroenterologist, and was given antibiotics. Pt states he was able to set up an appointment, but not able to schedule one in the near future. ASL interpreter was used.  Past Medical History:  Diagnosis Date  . Bronchitis   . CHF (congestive heart failure) ([place])   . Diabetes mellitus without complication ([place])   . Dyslipidemia   . Hypertension   . Pancreatitis     There are no active problems to display for this patient.   Past Surgical History:  Procedure Laterality Date  . BACK SURGERY    . CARDIAC CATHETERIZATION    . WRIST SURGERY       Home Medications    Prior to Admission medications   Medication Sig Start Date End Date Taking? Authorizing Provider  aspirin 81 MG chewable tablet Chew 81 mg by mouth daily.    Historical Provider, MD  atorvastatin (LIPITOR) 80 MG tablet Take 80 mg by mouth daily.    Historical  Provider, MD  carvedilol (COREG) 3.125 MG tablet Take 3.125 mg by mouth 2 (two) times daily with a meal.    Historical Provider, MD  ciprofloxacin (CIPRO) 500 MG tablet Take 1 tablet (500 mg total) by mouth 2 (two) times daily. 01/14/16   [name], MD  citalopram (CELEXA) 40 MG tablet Take 40 mg by mouth daily.    Historical Provider, MD  furosemide (LASIX) 40 MG tablet Take 20-40 mg by mouth 2 (two) times daily. 40MG  in the Am and 20MG  in the PM    Historical Provider, MD  losartan (COZAAR) 25 MG tablet Take 0.5 tablets (12.5 mg total) by mouth daily. 01/21/16   Amy D Ninfa Meeker, NP  metFORMIN (GLUCOPHAGE) 500 MG tablet Take 1 tablet (500 mg total) by mouth 2 (two) times daily with a meal. 12/13/15   Tatyana Kirichenko, PA-C  metroNIDAZOLE (FLAGYL) 500 MG tablet Take 1 tablet (500 mg total) by mouth 3 (three) times daily. 01/14/16   [name], MD  potassium chloride (K-DUR) 10 MEQ tablet Take 1 tablet (10 mEq total) by mouth daily. 12/13/15   Tatyana Kirichenko, PA-C  promethazine (PHENERGAN) 25 MG tablet Take 1 tablet (25 mg total) by mouth every 6 (six) hours as needed for nausea or vomiting. Patient not taking: Reported on 01/21/2016 01/14/16   [name], MD  spironolactone (ALDACTONE) 25 MG tablet Take 1 tablet (25 mg total) by mouth daily. 12/31/15 03/30/16  Elby Showers  Aundra Dubin, MD    Family History Family History  Problem Relation Age of Onset  . Hypertension Mother     Social History Social History  Substance Use Topics  . Smoking status: Never Smoker  . Smokeless tobacco: Never Used  . Alcohol use Yes     Comment: rare     Allergies   Lisinopril   Review of Systems Review of Systems A complete 10 system review of systems was obtained and all systems are negative except as noted in the HPI and PMH.    Physical Exam Updated Vital Signs BP (!) 146/104 (BP Location: Left Arm)   Pulse 104   Temp 98.2 F (36.8 C) (Oral)   Resp 18   SpO2 100%    Physical Exam  Constitutional: He is oriented to person, place, and time. Vital signs are normal. He appears well-developed and well-nourished. He is active. No distress.  HENT:  Head: Normocephalic and atraumatic.  Right Ear: Hearing normal.  Left Ear: Hearing normal.  Eyes: Conjunctivae and EOM are normal. Pupils are equal, round, and reactive to light.  Neck: Normal range of motion. Neck supple.  Cardiovascular: Normal rate, regular rhythm, normal heart sounds and intact distal pulses.   Pulmonary/Chest: Effort normal and breath sounds normal. No respiratory distress. He has no wheezes. He has no rales.  Abdominal: Soft. Normal appearance and bowel sounds are normal. There is no hepatosplenomegaly. There is generalized tenderness. There is no rigidity, no rebound, no guarding, no CVA tenderness, no tenderness at McBurney's point and negative Murphy's sign.  Generalized tenderness to palpation. Abdomen soft  Genitourinary:  Genitourinary Comments: Chaperone was present. Patient with no pain around the rectal area. There are no external fissures noted. No induration of the skin or swelling. No external hemorrhoids seen. Patient able to tolerate examination. I was able to feel the first 2-3cm of the rectum digitally without gross abnormality. There is no gross blood.  NO signs of perirectal abscess.  Neurological: He is alert and oriented to person, place, and time.  Skin: Skin is warm and dry.  Psychiatric: He has a normal mood and affect. His speech is normal and behavior is normal. Thought content normal.  Nursing note and vitals reviewed.  ED Treatments / Results  DIAGNOSTIC STUDIES: Oxygen Saturation is 100% on RA, normal by my interpretation.    COORDINATION OF CARE: 12:09 AM Discussed treatment plan with pt at bedside which includes lab work and Zofran and pt agreed to plan.  Labs (all labs ordered are listed, but only abnormal results are displayed) Labs Reviewed   COMPREHENSIVE METABOLIC PANEL - Abnormal; Notable for the following:       Result Value   Sodium 133 (*)    Potassium 5.2 (*)    Chloride 96 (*)    Glucose, Bld 375 (*)    All other components within normal limits  CBC - Abnormal; Notable for the following:    Hemoglobin 12.5 (*)    All other components within normal limits  POC OCCULT BLOOD, ED - Abnormal; Notable for the following:    Fecal Occult Bld POSITIVE (*)    All other components within normal limits  GASTROINTESTINAL PANEL BY PCR, STOOL (REPLACES STOOL CULTURE)  C DIFFICILE QUICK SCREEN W PCR REFLEX  LIPASE, BLOOD  URINALYSIS, ROUTINE W REFLEX MICROSCOPIC (NOT AT [facility])   EKG  EKG Interpretation None      Radiology No results found.  Procedures Procedures (including critical care time)  Medications Ordered  in ED Medications - No data to display   Initial Impression / Assessment and Plan / ED Course  I have reviewed the triage vital signs and the nursing notes.  Pertinent labs & imaging results that were available during my care of the patient were reviewed by me and considered in my medical decision making (see chart for details).  Clinical Course   Final Clinical Impressions(s) / ED Diagnoses   I have reviewed and evaluated the relevant laboratory valuesI have reviewed and evaluated the relevant imaging studies. I have reviewed the relevant previous healthcare records.I obtained HPI from historian. Patient discussed with supervising physician  ED Course:  Assessment: Patient is a 44yM presents with abdominal pain x 4 days. Recent visit on 01-13-16 for same. Dx Colitis and given ABX. Noted improvement with ABX, but returned after course finished. On exam, nontoxic, nonseptic appearing, in no apparent distress. Patient's pain and other symptoms adequately managed in emergency department.  Fluid bolus given.  Labs and vitals reviewed. CBC unremarkable. CMP unremarkable. Lipase negative. Noted black stool  recently with positive stool card in ED. No melanotic stool or gross blood on exam. Hgb 12.5. Patient does not meet the SIRS or Sepsis criteria.  On repeat exam patient does not have a surgical abdomen and there are no peritoneal signs.  No indication of appendicitis, bowel obstruction, bowel perforation, cholecystitis, diverticulitis. Discussed with supervising physician. Will obtained stool studies. Continue with scheduled GI appointment. Given Cipro/Flagyl in ED as well as Rx to take home. Likely reoccurrence of colitis. Counseled on need for Colonoscopy as well. Patient discharged home with symptomatic treatment and given strict instructions for follow-up with their primary care physician.  I have also discussed reasons to return immediately to the ER.  Patient expresses understanding and agrees with plan.  3:08 AM- Symptoms greatly improved in ED. Pt unable to provide stool sample in ED. I urged patient to try as we finish fluids and ABX.   5:05 AM- Unable to provide stool sample.     Disposition/Plan:  DC Home Additional Verbal discharge instructions given and discussed with patient.  Pt Instructed to f/u with PCP in the next week for evaluation and treatment of symptoms. Return precautions given Pt acknowledges and agrees with plan  Supervising Physician Merryl Hacker, MD  I personally performed the services described in this documentation, which was scribed in my presence. The recorded information has been reviewed and is accurate.   Final diagnoses:  Colitis  Occult blood positive stool    New Prescriptions New Prescriptions   No medications on file     Shary Decamp, PA-C 02/02/16 0505    Merryl Hacker, MD 02/03/16 [phone]

## 2016-02-01 NOTE — ED Triage Notes (Signed)
Pt has been throwing up since this past Friday and is very nausea and is having abdominal pain/ discomfort 9/10, Pt has also new incidence of  itching in both legs. Pt denies any pain or burning during urination. Pt denies blood in stool has noticed a little black color in stool, and has had diarrhea since Saturday.  Pt denies taking any medication for relief. Pt states that the vomits appears yellow in color. Pt appears anxious and diaphoretic.

## 2016-02-02 LAB — CBC
HEMATOCRIT: 39.5 % (ref 39.0–52.0)
Hemoglobin: 12.5 g/dL — ABNORMAL LOW (ref 13.0–17.0)
MCH: 27.2 pg (ref 26.0–34.0)
MCHC: 31.6 g/dL (ref 30.0–36.0)
MCV: 85.9 fL (ref 78.0–100.0)
PLATELETS: 276 10*3/uL (ref 150–400)
RBC: 4.6 MIL/uL (ref 4.22–5.81)
RDW: 14 % (ref 11.5–15.5)
WBC: 6 10*3/uL (ref 4.0–10.5)

## 2016-02-02 LAB — COMPREHENSIVE METABOLIC PANEL
ALBUMIN: 4.5 g/dL (ref 3.5–5.0)
ALT: 29 U/L (ref 17–63)
AST: 22 U/L (ref 15–41)
Alkaline Phosphatase: 43 U/L (ref 38–126)
Anion gap: 12 (ref 5–15)
BILIRUBIN TOTAL: 1.2 mg/dL (ref 0.3–1.2)
BUN: 20 mg/dL (ref 6–20)
CO2: 25 mmol/L (ref 22–32)
CREATININE: 1.1 mg/dL (ref 0.61–1.24)
Calcium: 9.4 mg/dL (ref 8.9–10.3)
Chloride: 96 mmol/L — ABNORMAL LOW (ref 101–111)
GFR calc Af Amer: >60 mL/min (ref >60)
GLUCOSE: 375 mg/dL — AB (ref 65–99)
POTASSIUM: 5.2 mmol/L — AB (ref 3.5–5.1)
Sodium: 133 mmol/L — ABNORMAL LOW (ref 135–145)
TOTAL PROTEIN: 7.6 g/dL (ref 6.5–8.1)

## 2016-02-02 LAB — LIPASE, BLOOD: Lipase: 30 U/L (ref 11–51)

## 2016-02-02 LAB — POC OCCULT BLOOD, ED: Fecal Occult Bld: POSITIVE — AB

## 2016-02-02 MED ORDER — SODIUM CHLORIDE 0.9 % IV BOLUS (SEPSIS)
1000.0000 mL | Freq: Once | INTRAVENOUS | Status: AC
  Administered 2016-02-02: 1000 mL via INTRAVENOUS

## 2016-02-02 MED ORDER — ONDANSETRON HCL 4 MG/2ML IJ SOLN
4.0000 mg | Freq: Once | INTRAMUSCULAR | Status: AC
  Administered 2016-02-02: 4 mg via INTRAVENOUS
  Filled 2016-02-02: qty 2

## 2016-02-02 MED ORDER — MORPHINE SULFATE (PF) 4 MG/ML IV SOLN
4.0000 mg | Freq: Once | INTRAVENOUS | Status: AC
  Administered 2016-02-02: 4 mg via INTRAVENOUS
  Filled 2016-02-02: qty 1

## 2016-02-02 MED ORDER — CIPROFLOXACIN IN D5W 400 MG/200ML IV SOLN
400.0000 mg | Freq: Once | INTRAVENOUS | Status: AC
  Administered 2016-02-02: 400 mg via INTRAVENOUS
  Filled 2016-02-02: qty 200

## 2016-02-02 MED ORDER — PROMETHAZINE HCL 25 MG PO TABS: 25.0000 mg | ORAL_TABLET | Freq: Four times a day (QID) | ORAL | 0 refills | Status: DC | PRN

## 2016-02-02 MED ORDER — METRONIDAZOLE 500 MG PO TABS: 500.0000 mg | ORAL_TABLET | Freq: Three times a day (TID) | ORAL | 0 refills | Status: DC

## 2016-02-02 MED ORDER — CIPROFLOXACIN HCL 500 MG PO TABS: 500.0000 mg | ORAL_TABLET | Freq: Two times a day (BID) | ORAL | 0 refills | Status: DC

## 2016-02-02 MED ORDER — METRONIDAZOLE 500 MG PO TABS
500.0000 mg | ORAL_TABLET | Freq: Once | ORAL | Status: AC
  Administered 2016-02-02: 500 mg via ORAL
  Filled 2016-02-02: qty 1

## 2016-02-02 NOTE — ED Notes (Signed)
Pt acknowledged that he cannot urinate.

## 2016-02-02 NOTE — ED Notes (Signed)
Pt unable to provide stool sample.

## 2016-02-02 NOTE — Discharge Instructions (Signed)
Please read and follow all provided instructions.  Your diagnoses today include:  1. Colitis   2. Occult blood positive stool     Tests performed today include: Vital signs. See below for your results today.   Medications prescribed:  Take as prescribed   Home care instructions:  Follow any educational materials contained in this packet.  Follow-up instructions: Please follow-up with your gastroenterologist for further evaluation of symptoms and treatment. You will also need a Colonoscopy  Return instructions:  Please return to the Emergency Department if you do not get better, if you get worse, or new symptoms OR  - Fever (temperature greater than 101.68F)  - Bleeding that does not stop with holding pressure to the area    -Severe pain (please note that you may be more sore the day after your accident)  - Chest Pain  - Difficulty breathing  - Severe nausea or vomiting  - Inability to tolerate food and liquids  - Passing out  - Skin becoming red around your wounds  - Change in mental status (confusion or lethargy)  - New numbness or weakness    Please return if you have any other emergent concerns.  Additional Information:  Your vital signs today were: BP (!) 146/104 (BP Location: Left Arm)    Pulse 104    Temp 98.2 F (36.8 C) (Oral)    Resp 18    SpO2 100%  If your blood pressure (BP) was elevated above 135/85 this visit, please have this repeated by your doctor within one month. ---------------

## 2016-02-04 ENCOUNTER — Encounter (HOSPITAL_COMMUNITY): Payer: Self-pay

## 2016-02-04 ENCOUNTER — Ambulatory Visit (HOSPITAL_COMMUNITY)
Admission: RE | Admit: 2016-02-04 | Discharge: 2016-02-04 | Disposition: A | Discharge status: Home / Self Care | Payer: Medicaid - Out of State | Source: Ambulatory Visit | Attending: Internal Medicine | Admitting: Internal Medicine

## 2016-02-04 VITALS — BP 124/86 | HR 95 | Wt 201.0 lb

## 2016-02-04 DIAGNOSIS — K529 Noninfective gastroenteritis and colitis, unspecified: Secondary | ICD-10-CM | POA: Diagnosis not present

## 2016-02-04 DIAGNOSIS — I5022 Chronic systolic (congestive) heart failure: Secondary | ICD-10-CM | POA: Insufficient documentation

## 2016-02-04 DIAGNOSIS — I110 Hypertensive heart disease with heart failure: Secondary | ICD-10-CM | POA: Diagnosis not present

## 2016-02-04 DIAGNOSIS — Z7982 Long term (current) use of aspirin: Secondary | ICD-10-CM | POA: Diagnosis not present

## 2016-02-04 DIAGNOSIS — Z7984 Long term (current) use of oral hypoglycemic drugs: Secondary | ICD-10-CM | POA: Insufficient documentation

## 2016-02-04 DIAGNOSIS — E785 Hyperlipidemia, unspecified: Secondary | ICD-10-CM | POA: Diagnosis not present

## 2016-02-04 DIAGNOSIS — H9193 Unspecified hearing loss, bilateral: Secondary | ICD-10-CM

## 2016-02-04 DIAGNOSIS — H9190 Unspecified hearing loss, unspecified ear: Secondary | ICD-10-CM | POA: Insufficient documentation

## 2016-02-04 DIAGNOSIS — E119 Type 2 diabetes mellitus without complications: Secondary | ICD-10-CM | POA: Diagnosis not present

## 2016-02-04 DIAGNOSIS — F329 Major depressive disorder, single episode, unspecified: Secondary | ICD-10-CM | POA: Diagnosis not present

## 2016-02-04 DIAGNOSIS — I428 Other cardiomyopathies: Secondary | ICD-10-CM

## 2016-02-04 MED ORDER — FUROSEMIDE 40 MG PO TABS: 40.0000 mg | ORAL_TABLET | Freq: Every day | ORAL | 6 refills | Status: DC

## 2016-02-04 NOTE — Patient Instructions (Signed)
HOLD Lasix for TWO DAYS. Resume Lasix after two day hold at a reduced dose of 40 mg ONCE daily.  STOP Potassium.  Follow up 2 weeks with Dr. Aundra Dubin.  Do the following things EVERYDAY: 1) Weigh yourself in the morning before breakfast. Write it down and keep it in a log. 2) Take your medicines as prescribed 3) Eat low salt foods-Limit salt (sodium) to 2000 mg per day.  4) Stay as active as you can everyday 5) Limit all fluids for the day to less than 2 liters

## 2016-02-04 NOTE — Progress Notes (Signed)
Letter for court completed

## 2016-02-04 NOTE — Progress Notes (Signed)
Return to work note completed

## 2016-02-04 NOTE — Progress Notes (Signed)
PCP: None Cardiology: Dr. Aundra Dubin  45 yo with deafness, HTN, DM, and chronic systolic CHF presents to establish cardiology care.  Patient had no known cardiac problems until 10/16.  At that time, he developed exertional dyspnea and was admitted to a hospital in [place], [place] with acute systolic CHF.  Echo 10/16 showed EF 15-20%.  Cardiac cath showed no significant coronary disease.  He was diuresed and started on cardiac meds. Subsequently, he moved to [facility].  Now working in a [employer].    He returns for HF follow up.  Last visit lisinopril was stopped due to cough and losartan was started. He was evaluated in the ED for abdominal pain and diagnosed with colitis. Started on cipro and flagyl. Complaining of ongoing N/V/D. Poor appetite. Weight at home trending down from 197 to 185 pounds. Complaining of weakness. SOB with steps. He will  get insurance October 30.  Rides the bus to appointments. Works in a [employer]. Works 5 days a week about 6 hours a day.  Taking all medications.   Of note, needs sign language interpreter.   Labs (9/17): BNP 477, K 4.5 => 4.2, creatinine 0.92 => 1.1, BNP 1009 Labs (12/2015) HIV NR, SPEP negative Labs 01/15/16: K 4.5 Creatinine 1.21  Labs 02/01/2016: K 5.2 Creatinine 1/10   PMH: 1. HTN 2. Diabetes 3. Hyperlipidemia 4. Deafness since age 60: Needs sign language interpreter.  5. Chronic systolic CHF: Nonischemic cardiomyopathy.  Diagnosis 10/16 in [place], [place] (admitted with acute systolic CHF).  - Echo (10/16): EF 15-20%, mild LVH, mild MR, normal RV size and systolic function.  - LHC (10/16): No significant CAD.  6. Depression.  SH: From [place] originally.  Lives in [place] until 2017, then moved to [place].  Nonsmoker.  Used to drink 3-4 ETOH beverages/day until 10/16, now stopped. Works in [employer].   FH: No known cardiac disease.   ROS: All systems reviewed and negative except as per HPI.   Current Outpatient Prescriptions  Medication Sig  Dispense Refill  . aspirin 81 MG chewable tablet Chew 81 mg by mouth daily.    Marland Kitchen atorvastatin (LIPITOR) 80 MG tablet Take 80 mg by mouth daily.    . carvedilol (COREG) 3.125 MG tablet Take 3.125 mg by mouth 2 (two) times daily with a meal.    . ciprofloxacin (CIPRO) 500 MG tablet Take 1 tablet (500 mg total) by mouth 2 (two) times daily. 20 tablet 0  . citalopram (CELEXA) 40 MG tablet Take 40 mg by mouth daily.    . furosemide (LASIX) 40 MG tablet Take 20-40 mg by mouth 2 (two) times daily. 40MG  in the Am and 20MG  in the PM    . losartan (COZAAR) 25 MG tablet Take 0.5 tablets (12.5 mg total) by mouth daily. 15 tablet 3  . metFORMIN (GLUCOPHAGE) 500 MG tablet Take 1 tablet (500 mg total) by mouth 2 (two) times daily with a meal. 60 tablet 1  . metroNIDAZOLE (FLAGYL) 500 MG tablet Take 1 tablet (500 mg total) by mouth 3 (three) times daily. 30 tablet 0  . potassium chloride (K-DUR) 10 MEQ tablet Take 1 tablet (10 mEq total) by mouth daily. 30 tablet 1  . promethazine (PHENERGAN) 25 MG tablet Take 1 tablet (25 mg total) by mouth every 6 (six) hours as needed for nausea or vomiting. 30 tablet 0  . spironolactone (ALDACTONE) 25 MG tablet Take 1 tablet (25 mg total) by mouth daily. 30 tablet 3   No current facility-administered medications  for this encounter.    BP 124/86 (BP Location: Left Arm, Patient Position: Sitting, Cuff Size: Normal)   Pulse 95   Wt 201 lb (91.2 kg)   SpO2 100%   BMI 27.26 kg/m    Sitting 128/82 Standing 126/80  General: NAD. Deaf interpreter present  Neck: JVP flat, no thyromegaly or thyroid nodule.  Lungs: Clear to auscultation bilaterally with normal respiratory effort. CV: Nondisplaced PMI.  Heart regular S1/S2, no S3/S4, no murmur.  No edema.  No carotid bruit.  Normal pedal pulses.  Abdomen: Soft, nontender, no hepatosplenomegaly, no distention.  Skin: Intact without lesions or rashes.  Neurologic: Alert and oriented x 3.  Psych: Normal affect. Extremities:  No clubbing or cyanosis.  HEENT: Normal.   Assessment/Plan: 1. Chronic systolic CHF: Nonischemic cardiomyopathy, etiology uncertain.  Consider prior ETOH use versus viral myocarditis.  SPEP HIV negative.  NYHA class II symptoms. He is not orthostatic. Volume status low in the setting of N/V/D. Hold lasix for 2 days then start lasix 40 mg daily. Stop potassium.  Continue low dose carvedilol.  Continue  losartan 12.5 mg daily. Hold off on entresto for now with N/V/D  Hope to set up CPX and CMRI next visit. He is trying to get insurance--> October 30.  -2. Type II diabetes: Needs PCP, have enlisted Education officer, museum to help.   3. HTN: Stable.   4. Deafness: Sign language interpreter at all appointments. . 5. Colitis- 2nd episode earlier this week. Currently on cipro and flagyl . He is scheduling follow up with GI today.   Follow up in 2 weeks. Anticipate switching to entresto at future visit. Needs CPX and CMRI once he has insurance. Provided with work note today.   Troy Melnyk NP-C  02/04/2016

## 2016-02-19 ENCOUNTER — Inpatient Hospital Stay (HOSPITAL_COMMUNITY): Admission: RE | Admit: 2016-02-19 | Payer: Medicaid - Out of State | Source: Ambulatory Visit

## 2016-02-19 ENCOUNTER — Telehealth (HOSPITAL_COMMUNITY): Payer: Self-pay | Admitting: Vascular Surgery

## 2016-02-19 NOTE — Telephone Encounter (Signed)
Returned pt call to reschedule appt 

## 2016-03-01 ENCOUNTER — Encounter (HOSPITAL_COMMUNITY): Payer: Self-pay | Admitting: *Deleted

## 2016-03-01 ENCOUNTER — Ambulatory Visit (HOSPITAL_COMMUNITY)
Admission: RE | Admit: 2016-03-01 | Discharge: 2016-03-01 | Disposition: A | Discharge status: Home / Self Care | Payer: 59 | Source: Ambulatory Visit | Attending: Cardiology | Admitting: Cardiology

## 2016-03-01 VITALS — BP 110/80 | HR 98 | Wt 194.5 lb

## 2016-03-01 DIAGNOSIS — I5022 Chronic systolic (congestive) heart failure: Secondary | ICD-10-CM | POA: Diagnosis not present

## 2016-03-01 DIAGNOSIS — I429 Cardiomyopathy, unspecified: Secondary | ICD-10-CM | POA: Insufficient documentation

## 2016-03-01 DIAGNOSIS — Z7984 Long term (current) use of oral hypoglycemic drugs: Secondary | ICD-10-CM | POA: Insufficient documentation

## 2016-03-01 DIAGNOSIS — R5383 Other fatigue: Secondary | ICD-10-CM | POA: Diagnosis not present

## 2016-03-01 DIAGNOSIS — R000 Tachycardia, unspecified: Secondary | ICD-10-CM | POA: Diagnosis not present

## 2016-03-01 DIAGNOSIS — F329 Major depressive disorder, single episode, unspecified: Secondary | ICD-10-CM | POA: Insufficient documentation

## 2016-03-01 DIAGNOSIS — Z7982 Long term (current) use of aspirin: Secondary | ICD-10-CM | POA: Insufficient documentation

## 2016-03-01 DIAGNOSIS — I110 Hypertensive heart disease with heart failure: Secondary | ICD-10-CM | POA: Insufficient documentation

## 2016-03-01 DIAGNOSIS — K529 Noninfective gastroenteritis and colitis, unspecified: Secondary | ICD-10-CM | POA: Diagnosis not present

## 2016-03-01 DIAGNOSIS — H9190 Unspecified hearing loss, unspecified ear: Secondary | ICD-10-CM | POA: Diagnosis not present

## 2016-03-01 DIAGNOSIS — E785 Hyperlipidemia, unspecified: Secondary | ICD-10-CM | POA: Insufficient documentation

## 2016-03-01 DIAGNOSIS — E119 Type 2 diabetes mellitus without complications: Secondary | ICD-10-CM | POA: Insufficient documentation

## 2016-03-01 LAB — BASIC METABOLIC PANEL
Anion gap: 11 (ref 5–15)
BUN: 17 mg/dL (ref 6–20)
CALCIUM: 9.6 mg/dL (ref 8.9–10.3)
CO2: 25 mmol/L (ref 22–32)
CREATININE: 1.01 mg/dL (ref 0.61–1.24)
Chloride: 93 mmol/L — ABNORMAL LOW (ref 101–111)
GFR calc Af Amer: >60 mL/min (ref >60)
GLUCOSE: 447 mg/dL — AB (ref 65–99)
Potassium: 5.4 mmol/L — ABNORMAL HIGH (ref 3.5–5.1)
Sodium: 129 mmol/L — ABNORMAL LOW (ref 135–145)

## 2016-03-01 LAB — BRAIN NATRIURETIC PEPTIDE: B Natriuretic Peptide: 523.1 pg/mL — ABNORMAL HIGH (ref 0.0–100.0)

## 2016-03-01 MED ORDER — DIGOXIN 125 MCG PO TABS: 0.1250 mg | ORAL_TABLET | Freq: Every day | ORAL | 3 refills | Status: DC

## 2016-03-01 MED ORDER — IVABRADINE HCL 5 MG PO TABS: 5.0000 mg | ORAL_TABLET | Freq: Two times a day (BID) | ORAL | 3 refills | Status: DC

## 2016-03-01 NOTE — Progress Notes (Signed)
Medication Samples have been provided to the patient.  Drug name: Corlanor       Strength: 5 mg        Qty: 1  LOT: [id number]  Exp.Date: 08/2016  Dosing instructions: 1 tab Twice daily   The patient has been instructed regarding the correct time, dose, and frequency of taking this medication, including desired effects and most common side effects.   Troy Rush 3:36 PM 03/01/2016

## 2016-03-01 NOTE — Patient Instructions (Signed)
Start Corlanor 5 mg Twice daily   Start Digoxin 0.125 mg daily  Labs today  Your physician has recommended that you have a cardiopulmonary stress test (CPX). CPX testing is a non-invasive measurement of heart and lung function. It replaces a traditional treadmill stress test. This type of test provides a tremendous amount of information that relates not only to your present condition but also for future outcomes. This test combines measurements of you ventilation, respiratory gas exchange in the lungs, electrocardiogram (EKG), blood pressure and physical response before, during, and following an exercise protocol.  Your physician has requested that you have a cardiac MRI. Cardiac MRI uses a computer to create images of your heart as its beating, producing both still and moving pictures of your heart and major blood vessels. For further information please visit [email or web address] Please follow the instruction sheet given to you today for more information.  Your physician recommends that you schedule a follow-up appointment in: 3 weeks

## 2016-03-02 NOTE — Progress Notes (Signed)
PCP: None Cardiology: Dr. Aundra Dubin  45 yo with deafness, HTN, DM, and chronic systolic CHF presents to establish cardiology care.  Patient had no known cardiac problems until 10/16.  At that time, he developed exertional dyspnea and was admitted to a hospital in [place], [place] with acute systolic CHF.  Echo 10/16 showed EF 15-20%.  Cardiac cath showed no significant coronary disease.  He was diuresed and started on cardiac meds. Subsequently, he moved to [facility].  Now working in a [employer].    He continues to have episodes where he feels very weak and fatigued.  He gets tired and short of breath walking up hills.  No orthopnea/PND.  No lightheadedness/dizzines.  Wears out easily.  Still able to function on his job in [employer].  Weight is down 7 lbs.     Of note, needs sign language interpreter.   ECG: NSR, LAE, poor RWP, narrow QRS  Labs (9/17): BNP 477, K 4.5 => 4.2, creatinine 0.92 => 1.1, BNP 1009, HIV negative, SPEP negative.  Labs (10/17): K 5.2, creatinine 1.10, hgb 12.5  PMH: 1. HTN 2. Diabetes 3. Hyperlipidemia 4. Deafness since age 1: Needs sign language interpreter.  5. Chronic systolic CHF: Nonischemic cardiomyopathy.  Diagnosis 10/16 in [place], [place] (admitted with acute systolic CHF).  SPEP and HIV negative.  - Echo (10/16): EF 15-20%, mild LVH, mild MR, normal RV size and systolic function.  - LHC (10/16): No significant CAD.  6. Depression. 7. Colitis episodes.   SH: From [place] originally.  Lives in [place] until 2017, then moved to [place].  Nonsmoker.  Used to drink 3-4 ETOH beverages/day until 10/16, now stopped. Works in [employer].   FH: No known cardiac disease.   ROS: All systems reviewed and negative except as per HPI.   Current Outpatient Prescriptions  Medication Sig Dispense Refill  . aspirin 81 MG chewable tablet Chew 81 mg by mouth daily.    Marland Kitchen atorvastatin (LIPITOR) 80 MG tablet Take 80 mg by mouth daily.    . carvedilol (COREG) 3.125 MG tablet  Take 3.125 mg by mouth 2 (two) times daily with a meal.    . citalopram (CELEXA) 40 MG tablet Take 40 mg by mouth daily.    . furosemide (LASIX) 40 MG tablet Take 1 tablet (40 mg total) by mouth daily. 30 tablet 6  . losartan (COZAAR) 25 MG tablet Take 0.5 tablets (12.5 mg total) by mouth daily. 15 tablet 3  . metFORMIN (GLUCOPHAGE) 500 MG tablet Take 1 tablet (500 mg total) by mouth 2 (two) times daily with a meal. 60 tablet 1  . promethazine (PHENERGAN) 25 MG tablet Take 1 tablet (25 mg total) by mouth every 6 (six) hours as needed for nausea or vomiting. 30 tablet 0  . spironolactone (ALDACTONE) 25 MG tablet Take 1 tablet (25 mg total) by mouth daily. 30 tablet 3  . digoxin (LANOXIN) 0.125 MG tablet Take 1 tablet (0.125 mg total) by mouth daily. 30 tablet 3  . ivabradine (CORLANOR) 5 MG TABS tablet Take 1 tablet (5 mg total) by mouth 2 (two) times daily with a meal. 60 tablet 3   No current facility-administered medications for this encounter.    BP 110/80 (BP Location: Right Arm, Patient Position: Sitting, Cuff Size: Normal)   Pulse 98   Wt 194 lb 8 oz (88.2 kg)   SpO2 99%   BMI 26.38 kg/m  General: NAD Neck: JVP 7 cm, no thyromegaly or thyroid nodule.  Lungs: Clear  to auscultation bilaterally with normal respiratory effort. CV: Nondisplaced PMI.  Heart mildly tachy regular S1/S2, no S3/S4, no murmur.  No edema.  No carotid bruit.  Normal pedal pulses.  Abdomen: Soft, nontender, no hepatosplenomegaly, no distention.  Skin: Intact without lesions or rashes.  Neurologic: Alert and oriented x 3.  Psych: Normal affect. Extremities: No clubbing or cyanosis.  HEENT: Normal.   Assessment/Plan: 1. Chronic systolic CHF: Nonischemic cardiomyopathy, etiology uncertain.  Consider prior ETOH use versus viral myocarditis.  NYHA class II-III symptoms. Weight is down and volume looks OK, but he is still fatigued/tired.  ?Component of low output.   - Awaiting cardiac MRI to reassess EF and to look  for infiltrative disease.   - Continue Coreg and losartan at current doses.    - Continue Lasix 40 mg daily, spironolactone 25 mg daily.  BMET/BNP today. - Given profound fatigue, I will not increase Coreg.  With mild tachycardia, I will start Corlanor 5 mg bid as this may help him symptomatically.  - Given concern for low output, will start digoxin 0.125 daily.  - I will arrange for CPX.  - He may need RHC, will see at next appointment how he is doing.  2. Type II diabetes: Needs PCP, have enlisted Education officer, [employer] to help.   3. HTN: BP is not elevated.  4. Deafness: He will need a sign language interpreter scheduled for his followup visits.   Followup in 3 weeks.   Loralie Champagne 03/02/2016

## 2016-03-09 ENCOUNTER — Telehealth (HOSPITAL_COMMUNITY): Payer: Self-pay | Admitting: *Deleted

## 2016-03-09 ENCOUNTER — Encounter (HOSPITAL_COMMUNITY): Payer: Self-pay | Admitting: *Deleted

## 2016-03-09 NOTE — Telephone Encounter (Signed)
Pre cert pending for CMRI.  Information put in Dr.McLeans folder to submit peer to peer. (see below)   Patient ID: [id number]  Time: Time: 03/09/2016 11:35 AM   Patient Name: Troy Rush, Troy Rush   Patient DOB: 31-Jan-1971   Physician Name: Tillie Rung [facility]  Physician Address: [address] [place], [place] [address]  Physician Tax XU:[id number]   CPT Code: [phone]  Based on the clinical information provided, this request has not demonstrated consistency with evidence-based clinical guidelines. Your Notification number request has been assigned Case Number [id number]. A Physician-to-Physician discussion is required to complete the Notification Process. The ordering physician must call [phone] and select option #3 to engage in a Physician-to-Physician discussion. Please ensure the physician has the case number provided above available when making this call. If a Physician-to-Physician discussion is not conducted within 3 business days, this notification number request will be deemed expired and you must reinitiate the Notification Process. To view the current status of this request, please visit [email or web address] and select Radiology Notification and Status under the Notifications menu or call [employer] at [phone].

## 2016-03-09 NOTE — Telephone Encounter (Signed)
Notes Recorded by Harvie Junior, CMA on 03/09/2016 at 2:19 PM EST Unable to reach patient letter mailed. ------  Notes Recorded by Harvie Junior, [facility] on 03/02/2016 at 4:00 PM EST Unable to reach patient. Will try to reach patient at another time. ------  Notes Recorded by Kerry Dory, CMA on 03/02/2016 at 11:29 AM EST Unable to reach patient. [phone] (H)  Will attempt to contact patient at another time ------  Notes Recorded by Larey Dresser, MD on 03/01/2016 at 5:54 PM EST 1. Blood glucose very high. Needs to be addressed by PCP.  2. K is high. Decrease spironolactone to 12.5 mg daily, low K diet, no K supplementation. 3. Decrease po fluid intake with low Na, (< 2 L/day).  4. Repeat BMET on Monday.    Ref Range & Units 8d ago (03/01/16) 108mo ago (02/01/16) 55mo ago (01/15/16)   Sodium 135 - 145 mmol/L 129   133   135    Potassium 3.5 - 5.1 mmol/L 5.4   5.2   4.5    Chloride 101 - 111 mmol/L 93   96   97     CO2 22 - 32 mmol/L 25  25  25     Glucose, Bld 65 - 99 mg/dL 447   375   162     BUN 6 - 20 mg/dL 17  20  11     Creatinine, Ser 0.61 - 1.24 mg/dL 1.01  1.10  1.21    Calcium 8.9 - 10.3 mg/dL 9.6  9.4  9.6    GFR calc non Af Amer >60 mL/min >60  >60  >60    GFR calc Af Amer >60 mL/min >60  >60CM  >60CM

## 2016-03-09 NOTE — Telephone Encounter (Signed)
-----   Message from Larey Dresser, MD sent at 03/01/2016  5:54 PM EST ----- 1. Blood glucose very high.  Needs to be addressed by PCP.  2. K is high.  Decrease spironolactone to 12.5 mg daily, low K diet, no K supplementation. 3. Decrease po fluid intake with low Na, (< 2 L/day).  4. Repeat BMET on Monday.

## 2016-03-17 ENCOUNTER — Telehealth (HOSPITAL_COMMUNITY): Payer: Self-pay | Admitting: Pharmacist

## 2016-03-17 NOTE — Telephone Encounter (Signed)
Corlanor PA approved by [facility] through 03/14/17.   Ruta Hinds. Velva Harman, PharmD, BCPS, CPP Clinical Pharmacist Pager: [phone] Phone: [phone] 03/17/2016 3:50 PM

## 2016-03-18 DIAGNOSIS — R933 Abnormal findings on diagnostic imaging of other parts of digestive tract: Secondary | ICD-10-CM | POA: Diagnosis not present

## 2016-03-21 ENCOUNTER — Encounter: Payer: Self-pay | Admitting: Family Medicine

## 2016-03-21 ENCOUNTER — Ambulatory Visit (INDEPENDENT_AMBULATORY_CARE_PROVIDER_SITE_OTHER): Payer: 59 | Admitting: Family Medicine

## 2016-03-21 VITALS — BP 136/89 | HR 79 | Temp 97.9°F | Resp 12 | Ht 69.0 in | Wt 200.0 lb

## 2016-03-21 DIAGNOSIS — F32A Depression, unspecified: Secondary | ICD-10-CM

## 2016-03-21 DIAGNOSIS — F329 Major depressive disorder, single episode, unspecified: Secondary | ICD-10-CM | POA: Diagnosis not present

## 2016-03-21 DIAGNOSIS — Z23 Encounter for immunization: Secondary | ICD-10-CM | POA: Diagnosis not present

## 2016-03-21 DIAGNOSIS — E118 Type 2 diabetes mellitus with unspecified complications: Secondary | ICD-10-CM | POA: Diagnosis not present

## 2016-03-21 DIAGNOSIS — E119 Type 2 diabetes mellitus without complications: Secondary | ICD-10-CM

## 2016-03-21 DIAGNOSIS — F330 Major depressive disorder, recurrent, mild: Secondary | ICD-10-CM

## 2016-03-21 DIAGNOSIS — F339 Major depressive disorder, recurrent, unspecified: Secondary | ICD-10-CM | POA: Insufficient documentation

## 2016-03-21 LAB — CBC WITH DIFFERENTIAL/PLATELET
BASOS ABS: 0 {cells}/uL (ref 0–200)
Basophils Relative: 0 %
Eosinophils Absolute: 40 cells/uL (ref 15–500)
Eosinophils Relative: 1 %
HEMATOCRIT: 41.6 % (ref 38.5–50.0)
HEMOGLOBIN: 12.5 g/dL — AB (ref 13.2–17.1)
LYMPHS ABS: 1640 {cells}/uL (ref 850–3900)
Lymphocytes Relative: 41 %
MCH: 26.7 pg — AB (ref 27.0–33.0)
MCHC: 30 g/dL — AB (ref 32.0–36.0)
MCV: 88.9 fL (ref 80.0–100.0)
MONO ABS: 320 {cells}/uL (ref 200–950)
MPV: 10.7 fL (ref 7.5–12.5)
Monocytes Relative: 8 %
NEUTROS ABS: 2000 {cells}/uL (ref 1500–7800)
NEUTROS PCT: 50 %
Platelets: 199 10*3/uL (ref 140–400)
RBC: 4.68 MIL/uL (ref 4.20–5.80)
RDW: 14.7 % (ref 11.0–15.0)
WBC: 4 10*3/uL (ref 3.8–10.8)

## 2016-03-21 MED ORDER — BLOOD GLUCOSE MONITOR KIT: PACK | 0 refills | Status: DC

## 2016-03-21 MED ORDER — CITALOPRAM HYDROBROMIDE 40 MG PO TABS: 40.0000 mg | ORAL_TABLET | Freq: Every day | ORAL | 1 refills | Status: DC

## 2016-03-21 MED ORDER — ATORVASTATIN CALCIUM 80 MG PO TABS: 80.0000 mg | ORAL_TABLET | Freq: Every day | ORAL | 1 refills | Status: DC

## 2016-03-21 NOTE — Progress Notes (Signed)
Troy Rush, is a 45 y.o. male  VQQ:[id number]  EPP:[id number]  DOB - 04/20/1970  CC:  Chief Complaint  Patient presents with  . new patient/get established    needs refill on 3 meds        HPI: Troy Rush is a 45 y.o. male here to establish care. He has recently moved here from [place]. He has a history of diabetes, hypertension and CHF. He does have a cardiologist here in town. He does need refills on citalopram for depression and atorvostatin for cholesterol. Cardiology is managing is other meds, except for metformin, which he does not need refilled at this time. He reports feeling generally well. He does have a history of pancreatitis and GI bleed and is followed by GI. He also needs a new glucometer and supplies.   Allergies  Allergen Reactions  . Lisinopril Cough   Past Medical History:  Diagnosis Date  . Bronchitis   . CHF (congestive heart failure) ([place])   . Diabetes mellitus without complication ([place])   . Dyslipidemia   . Hypertension   . Pancreatitis    Current Outpatient Prescriptions on File Prior to Visit  Medication Sig Dispense Refill  . aspirin 81 MG chewable tablet Chew 81 mg by mouth daily.    . carvedilol (COREG) 3.125 MG tablet Take 3.125 mg by mouth 2 (two) times daily with a meal.    . digoxin (LANOXIN) 0.125 MG tablet Take 1 tablet (0.125 mg total) by mouth daily. 30 tablet 3  . furosemide (LASIX) 40 MG tablet Take 1 tablet (40 mg total) by mouth daily. 30 tablet 6  . ivabradine (CORLANOR) 5 MG TABS tablet Take 1 tablet (5 mg total) by mouth 2 (two) times daily with a meal. 60 tablet 3  . losartan (COZAAR) 25 MG tablet Take 0.5 tablets (12.5 mg total) by mouth daily. 15 tablet 3  . metFORMIN (GLUCOPHAGE) 500 MG tablet Take 1 tablet (500 mg total) by mouth 2 (two) times daily with a meal. 60 tablet 1  . promethazine (PHENERGAN) 25 MG tablet Take 1 tablet (25 mg total) by mouth every 6 (six) hours as needed for nausea or vomiting. 30 tablet 0  .  spironolactone (ALDACTONE) 25 MG tablet Take 1 tablet (25 mg total) by mouth daily. 30 tablet 3   No current facility-administered medications on file prior to visit.    Family History  Problem Relation Age of Onset  . Hypertension Mother    Social History   Social History  . Marital status: Married    Spouse name: N/A  . Number of children: N/A  . Years of education: N/A   Occupational History  . Not on file.   Social History Main Topics  . Smoking status: Never Smoker  . Smokeless tobacco: Never Used  . Alcohol use Yes     Comment: rare  . Drug use: No  . Sexual activity: Not on file   Other Topics Concern  . Not on file   Social History Narrative  . No narrative on file    Review of Systems: Constitutional: + fatigue Skin: Negative HENT: Negative  + deafness Eyes: Negative + decreased visual acuity Neck: Negative Respiratory: Negative Cardiovascular: Negative Gastrointestinal: Negative Genitourinary: Negative  Musculoskeletal: Negative   Neurological: Negative for Hematological: Negative  Psychiatric/Behavioral: Negative    Objective:   Vitals:   03/21/16 1408  BP: 136/89  Pulse: 79  Resp: 12  Temp: 97.9 F (36.6 C)    Physical  Exam: Constitutional: Patient appears well-developed and well-nourished. No distress. HENT: Normocephalic, atraumatic, External right and left ear normal. Oropharynx is clear and moist.  Eyes: Conjunctivae and EOM are normal. PERRLA, no scleral icterus. Neck: Normal ROM. Neck supple. No lymphadenopathy, No thyromegaly. CVS: RRR, S1/S2 +, no murmurs, no gallops, no rubs Pulmonary: Effort and breath sounds normal, no stridor, rhonchi, wheezes, rales.  Abdominal: Soft. Normoactive BS,, no distension, tenderness, rebound or guarding.  Musculoskeletal: Normal range of motion. No edema and no tenderness.  Neuro: Alert.Normal muscle tone coordination. Non-focal Skin: Skin is warm and dry. No rash noted. Not diaphoretic. No  erythema. No pallor. Psychiatric: Normal mood and affect. Behavior, judgment, thought content normal.  Lab Results  Component Value Date   WBC 6.0 02/01/2016   HGB 12.5 (L) 02/01/2016   HCT 39.5 02/01/2016   MCV 85.9 02/01/2016   PLT 276 02/01/2016   Lab Results  Component Value Date   CREATININE 1.01 03/01/2016   BUN 17 03/01/2016   NA 129 (L) 03/01/2016   K 5.4 (H) 03/01/2016   CL 93 (L) 03/01/2016   CO2 25 03/01/2016    No results found for: HGBA1C Lipid Panel  No results found for: CHOL, TRIG, HDL, CHOLHDL, VLDL, LDLCALC      Assessment and plan:   1. Type 2 diabetes mellitus without complication, unspecified long term insulin use status (HCC)  - POCT glycosylated hemoglobin (Hb A1C) - CBC with Differential - COMPLETE METABOLIC PANEL WITH GFR - Lipid panel - Microalbumin, urine   No Follow-up on file.  The patient was given clear instructions to go to ER or return to medical center if symptoms don't improve, worsen or new problems develop. The patient verbalized understanding.    Micheline Chapman FNP  03/21/2016, 3:02 PM

## 2016-03-21 NOTE — Patient Instructions (Signed)
Continue current medication Work on diet and exercise

## 2016-03-22 ENCOUNTER — Ambulatory Visit (INDEPENDENT_AMBULATORY_CARE_PROVIDER_SITE_OTHER): Payer: 59

## 2016-03-22 ENCOUNTER — Ambulatory Visit (HOSPITAL_COMMUNITY)
Admission: RE | Admit: 2016-03-22 | Discharge: 2016-03-22 | Disposition: A | Discharge status: Home / Self Care | Payer: 59 | Source: Ambulatory Visit | Attending: Family Medicine | Admitting: Family Medicine

## 2016-03-22 ENCOUNTER — Other Ambulatory Visit: Payer: Self-pay | Admitting: Family Medicine

## 2016-03-22 ENCOUNTER — Telehealth: Payer: Self-pay

## 2016-03-22 ENCOUNTER — Telehealth: Payer: Self-pay | Admitting: Family Medicine

## 2016-03-22 ENCOUNTER — Ambulatory Visit (HOSPITAL_COMMUNITY): Payer: Medicare Other | Attending: Cardiology

## 2016-03-22 DIAGNOSIS — Z794 Long term (current) use of insulin: Secondary | ICD-10-CM | POA: Insufficient documentation

## 2016-03-22 DIAGNOSIS — E118 Type 2 diabetes mellitus with unspecified complications: Secondary | ICD-10-CM

## 2016-03-22 DIAGNOSIS — R739 Hyperglycemia, unspecified: Secondary | ICD-10-CM

## 2016-03-22 DIAGNOSIS — E1165 Type 2 diabetes mellitus with hyperglycemia: Secondary | ICD-10-CM | POA: Diagnosis not present

## 2016-03-22 LAB — MICROALBUMIN, URINE: Microalb, Ur: 4.5 mg/dL

## 2016-03-22 LAB — COMPLETE METABOLIC PANEL WITH GFR
ALBUMIN: 4.2 g/dL (ref 3.6–5.1)
ALK PHOS: 58 U/L (ref 40–115)
ALT: 17 U/L (ref 9–46)
AST: 14 U/L (ref 10–40)
BILIRUBIN TOTAL: 0.7 mg/dL (ref 0.2–1.2)
BUN: 20 mg/dL (ref 7–25)
CALCIUM: 9.3 mg/dL (ref 8.6–10.3)
CO2: 25 mmol/L (ref 20–31)
Chloride: 98 mmol/L (ref 98–110)
Creat: 1.03 mg/dL (ref 0.60–1.35)
GFR, Est African American: >89 mL/min (ref >=60)
GFR, Est Non African American: 86 mL/min (ref >=60)
GLUCOSE: 629 mg/dL — AB (ref 65–99)
POTASSIUM: 4.6 mmol/L (ref 3.5–5.3)
SODIUM: 133 mmol/L — AB (ref 135–146)
TOTAL PROTEIN: 7.4 g/dL (ref 6.1–8.1)

## 2016-03-22 LAB — LIPID PANEL
CHOL/HDL RATIO: 4.2 ratio (ref <5.0)
CHOLESTEROL: 233 mg/dL — AB (ref <200)
HDL: 55 mg/dL (ref >40)
LDL Cholesterol: 110 mg/dL — ABNORMAL HIGH (ref <100)
Triglycerides: 339 mg/dL — ABNORMAL HIGH (ref <150)
VLDL: 68 mg/dL — ABNORMAL HIGH (ref <30)

## 2016-03-22 LAB — GLUCOSE, CAPILLARY
GLUCOSE-CAPILLARY: 477 mg/dL — AB (ref 65–99)
GLUCOSE-CAPILLARY: 516 mg/dL — AB (ref 65–99)
GLUCOSE-CAPILLARY: 104 mg/dL — AB (ref 65–99)

## 2016-03-22 MED ORDER — SODIUM CHLORIDE 0.9 % IV BOLUS (SEPSIS)
500.0000 mL | Freq: Once | INTRAVENOUS | Status: AC
  Administered 2016-03-22: 500 mL via INTRAVENOUS

## 2016-03-22 MED ORDER — INSULIN LISPRO PROT & LISPRO (50-50 MIX) 100 UNIT/ML ~~LOC~~ SUSP
10.0000 [IU] | Freq: Once | SUBCUTANEOUS | Status: AC
  Administered 2016-03-22: 10 [IU] via SUBCUTANEOUS

## 2016-03-22 NOTE — Telephone Encounter (Signed)
Called to speak with patient regarding critical glucose level of 629 from yesterday. No answer, sign language interpreter left message for patient to call back as soon as possible. When patient calls back we need to get him to come into office for a glucose check today. Will try to call later.

## 2016-03-22 NOTE — Telephone Encounter (Signed)
[name], a 45 year old male was evaluated in the office on 03/21/2016. He currently has a critical hemoglobin of 629. Our staff made an attempt to contact patient to come to office or report to the emergency department without success.    Dorena Dew, FNP

## 2016-03-22 NOTE — Progress Notes (Addendum)
Diagnosis: Hyperglycemia  Provider: Lorel Monaco, NP  Procedure: Pt is deaf and the video interpretor was used.  Pt  received 500ccs ns via piv. Pt had cbg done at the end of the 500ccs and NP was notified of result.  Pt tolerated well.  Post procedure: IV was discontinued.  Pt alert, oriented and ambulatory at discharge. D/C instructions given to patient with verbal understanding.

## 2016-03-22 NOTE — Progress Notes (Signed)
[name] was transitioned to the day infusion center for a fluid bolus of normal saline due to hyperglycemia. Will follow up in clinic on 03/28/2016.    Dorena Dew, FNP

## 2016-03-23 ENCOUNTER — Other Ambulatory Visit (HOSPITAL_COMMUNITY): Payer: Self-pay | Admitting: *Deleted

## 2016-03-23 DIAGNOSIS — I5022 Chronic systolic (congestive) heart failure: Secondary | ICD-10-CM | POA: Diagnosis not present

## 2016-03-24 LAB — HEMOGLOBIN A1C

## 2016-03-28 ENCOUNTER — Ambulatory Visit (HOSPITAL_COMMUNITY)
Admission: RE | Admit: 2016-03-28 | Discharge: 2016-03-28 | Disposition: A | Discharge status: Home / Self Care | Payer: 59 | Source: Ambulatory Visit | Attending: Cardiology | Admitting: Cardiology

## 2016-03-28 ENCOUNTER — Encounter (HOSPITAL_COMMUNITY): Payer: 59

## 2016-03-28 ENCOUNTER — Encounter (HOSPITAL_COMMUNITY): Payer: Self-pay

## 2016-03-28 ENCOUNTER — Ambulatory Visit (INDEPENDENT_AMBULATORY_CARE_PROVIDER_SITE_OTHER): Payer: 59 | Admitting: Family Medicine

## 2016-03-28 VITALS — BP 126/78 | HR 94 | Wt 200.6 lb

## 2016-03-28 DIAGNOSIS — Z7982 Long term (current) use of aspirin: Secondary | ICD-10-CM | POA: Diagnosis not present

## 2016-03-28 DIAGNOSIS — E785 Hyperlipidemia, unspecified: Secondary | ICD-10-CM | POA: Insufficient documentation

## 2016-03-28 DIAGNOSIS — I5022 Chronic systolic (congestive) heart failure: Secondary | ICD-10-CM | POA: Diagnosis not present

## 2016-03-28 DIAGNOSIS — I110 Hypertensive heart disease with heart failure: Secondary | ICD-10-CM | POA: Diagnosis not present

## 2016-03-28 DIAGNOSIS — Z7984 Long term (current) use of oral hypoglycemic drugs: Secondary | ICD-10-CM | POA: Insufficient documentation

## 2016-03-28 DIAGNOSIS — E119 Type 2 diabetes mellitus without complications: Secondary | ICD-10-CM

## 2016-03-28 DIAGNOSIS — H9190 Unspecified hearing loss, unspecified ear: Secondary | ICD-10-CM | POA: Diagnosis not present

## 2016-03-28 DIAGNOSIS — I429 Cardiomyopathy, unspecified: Secondary | ICD-10-CM | POA: Diagnosis not present

## 2016-03-28 DIAGNOSIS — F329 Major depressive disorder, single episode, unspecified: Secondary | ICD-10-CM | POA: Diagnosis not present

## 2016-03-28 DIAGNOSIS — I428 Other cardiomyopathies: Secondary | ICD-10-CM

## 2016-03-28 DIAGNOSIS — I158 Other secondary hypertension: Secondary | ICD-10-CM

## 2016-03-28 DIAGNOSIS — E118 Type 2 diabetes mellitus with unspecified complications: Secondary | ICD-10-CM | POA: Diagnosis not present

## 2016-03-28 LAB — BASIC METABOLIC PANEL
ANION GAP: 13 (ref 5–15)
BUN: 10 mg/dL (ref 6–20)
CALCIUM: 9.7 mg/dL (ref 8.9–10.3)
CO2: 26 mmol/L (ref 22–32)
Chloride: 92 mmol/L — ABNORMAL LOW (ref 101–111)
Creatinine, Ser: 1.07 mg/dL (ref 0.61–1.24)
Glucose, Bld: 419 mg/dL — ABNORMAL HIGH (ref 65–99)
POTASSIUM: 5.3 mmol/L — AB (ref 3.5–5.1)
SODIUM: 131 mmol/L — AB (ref 135–145)

## 2016-03-28 MED ORDER — CARVEDILOL 3.125 MG PO TABS: 3.1250 mg | ORAL_TABLET | Freq: Two times a day (BID) | ORAL | 3 refills | Status: DC

## 2016-03-28 MED ORDER — METFORMIN HCL 1000 MG PO TABS: 1000.0000 mg | ORAL_TABLET | Freq: Two times a day (BID) | ORAL | 3 refills | Status: DC

## 2016-03-28 MED ORDER — SPIRONOLACTONE 25 MG PO TABS: 25.0000 mg | ORAL_TABLET | Freq: Every day | ORAL | 3 refills | Status: DC

## 2016-03-28 MED ORDER — DIGOXIN 125 MCG PO TABS: 0.1250 mg | ORAL_TABLET | Freq: Every day | ORAL | 3 refills | Status: DC

## 2016-03-28 MED ORDER — LOSARTAN POTASSIUM 25 MG PO TABS: 12.5000 mg | ORAL_TABLET | Freq: Every day | ORAL | 3 refills | Status: DC

## 2016-03-28 MED ORDER — IVABRADINE HCL 5 MG PO TABS
5.0000 mg | ORAL_TABLET | Freq: Two times a day (BID) | ORAL | 3 refills | Status: DC
Start: 2016-03-28 — End: 2016-06-27

## 2016-03-28 MED ORDER — SACUBITRIL-VALSARTAN 24-26 MG PO TABS: 1.0000 | ORAL_TABLET | Freq: Two times a day (BID) | ORAL | 3 refills | Status: DC

## 2016-03-28 MED ORDER — FUROSEMIDE 40 MG PO TABS: 40.0000 mg | ORAL_TABLET | Freq: Every day | ORAL | 6 refills | Status: DC

## 2016-03-28 MED ORDER — ATORVASTATIN CALCIUM 80 MG PO TABS: 80.0000 mg | ORAL_TABLET | Freq: Every day | ORAL | 3 refills | Status: DC

## 2016-03-28 NOTE — Progress Notes (Signed)
Advised patient per Sharon Seller, NP to increase Metformin 500mg  2 tablets BID and to return in 3 weeks for bloodwork. Via stratus interpreting [id number].

## 2016-03-28 NOTE — Progress Notes (Signed)
PCP: None Cardiology: Dr. Aundra Dubin  45 yo with deafness, HTN, DM, and chronic systolic CHF presents to establish cardiology care.  Patient had no known cardiac problems until 10/16.  At that time, he developed exertional dyspnea and was admitted to a hospital in [place], [place] with acute systolic CHF.  Echo 10/16 showed EF 15-20%.  Cardiac cath showed no significant coronary disease.  He was diuresed and started on cardiac meds. Subsequently, he moved to [facility].  Now working in a [employer].    Today he returns for HF follow up with interrpreter present. Last visit corlanor was added. Overall feeling ok. Does admit to cough at night. Denies PND. Mild dyspnea with steps. Taking all medications.   Of note, needs sign language interpreter.   ECG: NSR, LAE, poor RWP, narrow QRS  Labs (9/17): BNP 477, K 4.5 => 4.2, creatinine 0.92 => 1.1, BNP 1009, HIV negative, SPEP negative.  Labs (10/17): K 5.2, creatinine 1.10, hgb 12.5  PMH: 1. HTN 2. Diabetes 3. Hyperlipidemia 4. Deafness since age 56: Needs sign language interpreter.  5. Chronic systolic CHF: Nonischemic cardiomyopathy.  Diagnosis 10/16 in [place], [place] (admitted with acute systolic CHF).  SPEP and HIV negative.  - Echo (10/16): EF 15-20%, mild LVH, mild MR, normal RV size and systolic function.  - LHC (10/16): No significant CAD.  6. Depression. 7. Colitis episodes.   SH: From [place] originally.  Lives in [place] until 2017, then moved to [place].  Nonsmoker.  Used to drink 3-4 ETOH beverages/day until 10/16, now stopped. Works in [employer].   FH: No known cardiac disease.   ROS: All systems reviewed and negative except as per HPI.   Current Outpatient Prescriptions  Medication Sig Dispense Refill  . aspirin 81 MG chewable tablet Chew 81 mg by mouth daily.    Marland Kitchen atorvastatin (LIPITOR) 80 MG tablet Take 1 tablet (80 mg total) by mouth daily. 30 tablet 3  . blood glucose meter kit and supplies KIT Dispense based on patient and  insurance preference. Use up to four times daily as directed. (FOR ICD-9 250.00, 250.01). 1 each 0  . carvedilol (COREG) 3.125 MG tablet Take 1 tablet (3.125 mg total) by mouth 2 (two) times daily with a meal. 60 tablet 3  . citalopram (CELEXA) 40 MG tablet Take 1 tablet (40 mg total) by mouth daily. 90 tablet 1  . digoxin (LANOXIN) 0.125 MG tablet Take 1 tablet (0.125 mg total) by mouth daily. 30 tablet 3  . furosemide (LASIX) 40 MG tablet Take 1 tablet (40 mg total) by mouth daily. 30 tablet 6  . ivabradine (CORLANOR) 5 MG TABS tablet Take 1 tablet (5 mg total) by mouth 2 (two) times daily with a meal. 60 tablet 3  . losartan (COZAAR) 25 MG tablet Take 0.5 tablets (12.5 mg total) by mouth daily. 15 tablet 3  . metFORMIN (GLUCOPHAGE) 500 MG tablet Take 1 tablet (500 mg total) by mouth 2 (two) times daily with a meal. 60 tablet 1  . promethazine (PHENERGAN) 25 MG tablet Take 1 tablet (25 mg total) by mouth every 6 (six) hours as needed for nausea or vomiting. 30 tablet 0  . spironolactone (ALDACTONE) 25 MG tablet Take 1 tablet (25 mg total) by mouth daily. 30 tablet 3   No current facility-administered medications for this encounter.    BP 126/78   Pulse 94   Wt 200 lb 9.6 oz (91 kg)   SpO2 100%   BMI 29.62 kg/m  General: NAD Neck: JVP 7 cm, no thyromegaly or thyroid nodule.  Lungs: Clear to auscultation bilaterally with normal respiratory effort. CV: Nondisplaced PMI.  Heart mildly tachy regular S1/S2, no S3/S4, no murmur.  No edema.  No carotid bruit.  Normal pedal pulses.  Abdomen: Soft, nontender, no hepatosplenomegaly, no distention.  Skin: Intact without lesions or rashes.  Neurologic: Alert and oriented x 3.  Psych: Normal affect. Extremities: No clubbing or cyanosis.  HEENT: Normal.   Assessment/Plan: 1. Chronic systolic CHF: Nonischemic cardiomyopathy, etiology uncertain.  Consider prior ETOH use versus viral myocarditis.  NYHA class II-III symptoms.    - Awaiting cardiac MRI  to reassess EF and to look for infiltrative disease.  We are trying to set up.  - Volume status mildly elevated. Continue Coreg - Stop losartan. Start entresto 24-26 mg twice a day.     - Continue Lasix 40 mg daily, spironolactone 25 mg daily.   - Given concern for low output, will continue digoxin 0.125 daily.  - CPX- mild HF limitation .   - He may need RHC, will see at next appointment how he is doing.  2. Type II diabetes: Needs PCP, have enlisted Education officer, museum to help.   3. HTN: BP is not elevated.  4. Deafness: He will need a sign language interpreter scheduled for his followup visits.   Follow up in 2 weeks. BMET  Next visit.   Yolinda Duerr NP-C  03/28/2016

## 2016-03-28 NOTE — Patient Instructions (Signed)
STOP Losartan.  In 36 hours START Entresto 24/26mg  1 tablet twice daily.  Routine lab work today. Will notify you of abnormal results  Follow up in 2 weeks.

## 2016-03-29 ENCOUNTER — Telehealth: Payer: Self-pay | Admitting: *Deleted

## 2016-03-29 NOTE — Telephone Encounter (Signed)
Patient verified DOB Patient is aware of provider requesting the patient to check his blood sugar twice a day. Patient is advised to check once in the morning while fasting (no food or drink consumption) and another check 2 hours after consuming a meal. Patient instructed to log information for 1 month and to bring the log to the 04/29/15 appointment. Patient states he does have an Accu-chek meter in home.  Patient advised to call the office if any refills on supplies are needed. Patient expressed his understanding and had no further questions at this time.

## 2016-04-12 ENCOUNTER — Other Ambulatory Visit (HOSPITAL_COMMUNITY): Payer: Self-pay

## 2016-04-12 ENCOUNTER — Ambulatory Visit (HOSPITAL_COMMUNITY)
Admission: RE | Admit: 2016-04-12 | Discharge: 2016-04-12 | Disposition: A | Discharge status: Home / Self Care | Payer: BLUE CROSS/BLUE SHIELD | Source: Ambulatory Visit | Attending: Cardiology | Admitting: Cardiology

## 2016-04-12 VITALS — BP 130/82 | HR 71 | Wt 196.2 lb

## 2016-04-12 DIAGNOSIS — K529 Noninfective gastroenteritis and colitis, unspecified: Secondary | ICD-10-CM | POA: Insufficient documentation

## 2016-04-12 DIAGNOSIS — Z7984 Long term (current) use of oral hypoglycemic drugs: Secondary | ICD-10-CM | POA: Diagnosis not present

## 2016-04-12 DIAGNOSIS — Z7982 Long term (current) use of aspirin: Secondary | ICD-10-CM | POA: Insufficient documentation

## 2016-04-12 DIAGNOSIS — I5022 Chronic systolic (congestive) heart failure: Secondary | ICD-10-CM

## 2016-04-12 DIAGNOSIS — I428 Other cardiomyopathies: Secondary | ICD-10-CM

## 2016-04-12 DIAGNOSIS — I429 Cardiomyopathy, unspecified: Secondary | ICD-10-CM | POA: Diagnosis not present

## 2016-04-12 DIAGNOSIS — F329 Major depressive disorder, single episode, unspecified: Secondary | ICD-10-CM | POA: Insufficient documentation

## 2016-04-12 DIAGNOSIS — H9190 Unspecified hearing loss, unspecified ear: Secondary | ICD-10-CM | POA: Insufficient documentation

## 2016-04-12 DIAGNOSIS — I110 Hypertensive heart disease with heart failure: Secondary | ICD-10-CM | POA: Diagnosis not present

## 2016-04-12 DIAGNOSIS — E785 Hyperlipidemia, unspecified: Secondary | ICD-10-CM | POA: Diagnosis not present

## 2016-04-12 DIAGNOSIS — H9193 Unspecified hearing loss, bilateral: Secondary | ICD-10-CM

## 2016-04-12 DIAGNOSIS — E875 Hyperkalemia: Secondary | ICD-10-CM | POA: Insufficient documentation

## 2016-04-12 DIAGNOSIS — E119 Type 2 diabetes mellitus without complications: Secondary | ICD-10-CM | POA: Diagnosis not present

## 2016-04-12 DIAGNOSIS — I158 Other secondary hypertension: Secondary | ICD-10-CM

## 2016-04-12 LAB — BASIC METABOLIC PANEL
Anion gap: 7 (ref 5–15)
BUN: 17 mg/dL (ref 6–20)
CALCIUM: 9.6 mg/dL (ref 8.9–10.3)
CO2: 27 mmol/L (ref 22–32)
CREATININE: 1.1 mg/dL (ref 0.61–1.24)
Chloride: 99 mmol/L — ABNORMAL LOW (ref 101–111)
GFR calc Af Amer: >60 mL/min (ref >60)
GLUCOSE: 336 mg/dL — AB (ref 65–99)
Potassium: 5.6 mmol/L — ABNORMAL HIGH (ref 3.5–5.1)
SODIUM: 133 mmol/L — AB (ref 135–145)

## 2016-04-12 MED ORDER — CARVEDILOL 6.25 MG PO TABS: 6.2500 mg | ORAL_TABLET | Freq: Two times a day (BID) | ORAL | 3 refills | Status: DC

## 2016-04-12 NOTE — Patient Instructions (Addendum)
Routine lab work today. Will notify you of abnormal results, otherwise no news is good news!  INCREASE Carvedilol (Coreg) to 6.25 mg twice daily. Can "double up" on your current 3.125 mg tablets (Take 2 tabs twice daily). New Rx for 6.25 mg tablet has been sent to your pharmacy (Take 1 tab twice daily).  Will schedule cardiac MRI.  Follow up 4 weeks with Troy Clegg NP-C.  Do the following things EVERYDAY: 1) Weigh yourself in the morning before breakfast. Write it down and keep it in a log. 2) Take your medicines as prescribed 3) Eat low salt foods-Limit salt (sodium) to 2000 mg per day.  4) Stay as active as you can everyday 5) Limit all fluids for the day to less than 2 liters

## 2016-04-12 NOTE — Progress Notes (Signed)
PCP: None Cardiology: Dr. Aundra Dubin  46 yo with deafness, HTN, DM, and chronic systolic CHF presents to establish cardiology care.  Patient had no known cardiac problems until 10/16.  At that time, he developed exertional dyspnea and was admitted to a hospital in [place], [place] with acute systolic CHF.  Echo 10/16 showed EF 15-20%.  Cardiac cath showed no significant coronary disease.  He was diuresed and started on cardiac meds. Subsequently, he moved to [facility].  Now working in a [employer].    Today he returns for HF follow up with interrpreter present. Last visit potassium was elevated so he was started on veltassa. He just started 4 days ago. Denies SOB/PND/Orthopnea. Weight at home 185-187 pounds. Taking all medications.   Of note, needs sign language interpreter.   ECG: NSR, LAE, poor RWP, narrow QRS  Labs (9/17): BNP 477, K 4.5 => 4.2, creatinine 0.92 => 1.1, BNP 1009, HIV negative, SPEP negative.  Labs (10/17): K 5.2, creatinine 1.10, hgb 12.5 Labs (03/28/2016): K 5.3 Creatinine 1.07   PMH: 1. HTN 2. Diabetes 3. Hyperlipidemia 4. Deafness since age 45: Needs sign language interpreter.  5. Chronic systolic CHF: Nonischemic cardiomyopathy.  Diagnosis 10/16 in [place], [place] (admitted with acute systolic CHF).  SPEP and HIV negative.  - Echo (10/16): EF 15-20%, mild LVH, mild MR, normal RV size and systolic function.  - LHC (10/16): No significant CAD.  6. Depression. 7. Colitis episodes.   SH: From [place] originally.  Lives in [place] until 2017, then moved to [place].  Nonsmoker.  Used to drink 3-4 ETOH beverages/day until 10/16, now stopped. Works in [employer].   FH: No known cardiac disease.   ROS: All systems reviewed and negative except as per HPI.   Current Outpatient Prescriptions  Medication Sig Dispense Refill  . aspirin 81 MG chewable tablet Chew 81 mg by mouth daily.    Marland Kitchen atorvastatin (LIPITOR) 80 MG tablet Take 1 tablet (80 mg total) by mouth daily. 30 tablet 3  .  blood glucose meter kit and supplies KIT Dispense based on patient and insurance preference. Use up to four times daily as directed. (FOR ICD-9 250.00, 250.01). 1 each 0  . carvedilol (COREG) 3.125 MG tablet Take 1 tablet (3.125 mg total) by mouth 2 (two) times daily with a meal. 60 tablet 3  . citalopram (CELEXA) 40 MG tablet Take 1 tablet (40 mg total) by mouth daily. 90 tablet 1  . digoxin (LANOXIN) 0.125 MG tablet Take 1 tablet (0.125 mg total) by mouth daily. 30 tablet 3  . furosemide (LASIX) 40 MG tablet Take 1 tablet (40 mg total) by mouth daily. 30 tablet 6  . ivabradine (CORLANOR) 5 MG TABS tablet Take 1 tablet (5 mg total) by mouth 2 (two) times daily with a meal. 60 tablet 3  . metFORMIN (GLUCOPHAGE) 1000 MG tablet Take 1 tablet (1,000 mg total) by mouth 2 (two) times daily with a meal. 180 tablet 3  . Patiromer Sorbitex Calcium (VELTASSA) 16.8 g PACK Take by mouth.    . promethazine (PHENERGAN) 25 MG tablet Take 1 tablet (25 mg total) by mouth every 6 (six) hours as needed for nausea or vomiting. 30 tablet 0  . sacubitril-valsartan (ENTRESTO) 24-26 MG Take 1 tablet by mouth 2 (two) times daily. 60 tablet 3  . spironolactone (ALDACTONE) 25 MG tablet Take 1 tablet (25 mg total) by mouth daily. 30 tablet 3   No current facility-administered medications for this encounter.    BP  130/82 (BP Location: Left Arm, Patient Position: Sitting, Cuff Size: Normal)   Pulse 71   Wt 196 lb 3.2 oz (89 kg)   SpO2 99%   BMI 28.97 kg/m  General: NAD. interpreter present  Neck: JVP flat no thyromegaly or thyroid nodule.  Lungs: Clear to auscultation bilaterally with normal respiratory effort. CV: Nondisplaced PMI.  Heart regular S1/S2, no S3/S4, no murmur.  No edema.  No carotid bruit.  Normal pedal pulses.  Abdomen: Soft, nontender, no hepatosplenomegaly, no distention.  Skin: Intact without lesions or rashes.  Neurologic: Alert and oriented x 3.  Psych: Normal affect. Extremities: No clubbing or  cyanosis.  HEENT: Normal.   Assessment/Plan: 1. Chronic systolic CHF: Nonischemic cardiomyopathy, etiology uncertain.  Consider prior ETOH use versus viral myocarditis.  NYHA class II symptoms.    - Awaiting cardiac MRI to reassess EF and to look for infiltrative disease.  We are trying to set up today   - Volume status stable.   - Increase coreg to 6.25 mg twice a day.  - Continue entresto 24-26 mg twice a day.     - Continue Lasix 40 mg daily, spironolactone 25 mg daily.  Check BMET today - Given concern for low output, will continue digoxin 0.125 daily.  - CPX- mild HF limitation .   - He may need RHC, will see at next appointment how he is doing.  2. Type II diabetes: Needs PCP, have enlisted Education officer, museum to help.   3. HTN: BP is not elevated.  4. Deafness: He will need a sign language interpreter scheduled for his followup visits 5. Hyperkalemia- Started on veltassa 16.8. Repeat BMET next week. May need to increase.     CMRI  Set up.   Follow up in 4 weeks with Dr Aundra Dubin.    Jearlene Bridwell NP-C  04/12/2016

## 2016-04-18 ENCOUNTER — Ambulatory Visit (HOSPITAL_COMMUNITY)
Admission: RE | Admit: 2016-04-18 | Discharge: 2016-04-18 | Disposition: A | Discharge status: Home / Self Care | Payer: BLUE CROSS/BLUE SHIELD | Source: Ambulatory Visit | Attending: Family Medicine | Admitting: Family Medicine

## 2016-04-18 ENCOUNTER — Encounter: Payer: Self-pay | Admitting: Family Medicine

## 2016-04-18 ENCOUNTER — Ambulatory Visit (INDEPENDENT_AMBULATORY_CARE_PROVIDER_SITE_OTHER): Payer: BLUE CROSS/BLUE SHIELD | Admitting: Family Medicine

## 2016-04-18 ENCOUNTER — Other Ambulatory Visit: Payer: 59

## 2016-04-18 VITALS — BP 137/91 | HR 88 | Temp 98.3°F | Resp 18 | Ht 69.0 in | Wt 198.0 lb

## 2016-04-18 DIAGNOSIS — R739 Hyperglycemia, unspecified: Secondary | ICD-10-CM | POA: Insufficient documentation

## 2016-04-18 DIAGNOSIS — E118 Type 2 diabetes mellitus with unspecified complications: Secondary | ICD-10-CM

## 2016-04-18 DIAGNOSIS — I10 Essential (primary) hypertension: Secondary | ICD-10-CM | POA: Diagnosis not present

## 2016-04-18 DIAGNOSIS — H9193 Unspecified hearing loss, bilateral: Secondary | ICD-10-CM | POA: Insufficient documentation

## 2016-04-18 LAB — POCT GLYCOSYLATED HEMOGLOBIN (HGB A1C): Hemoglobin A1C: 14.8

## 2016-04-18 LAB — GLUCOSE, CAPILLARY
Glucose-Capillary: 360 mg/dL — ABNORMAL HIGH (ref 65–99)
Glucose-Capillary: 236 mg/dL — ABNORMAL HIGH (ref 65–99)

## 2016-04-18 MED ORDER — "INSULIN SYRINGE 29G X 1/2"" 0.5 ML MISC": 1.0000 | Freq: Three times a day (TID) | 2 refills | Status: DC

## 2016-04-18 MED ORDER — SITAGLIPTIN PHOSPHATE 50 MG PO TABS: 50.0000 mg | ORAL_TABLET | Freq: Every day | ORAL | 2 refills | Status: DC

## 2016-04-18 MED ORDER — INSULIN GLARGINE 100 UNIT/ML SOLOSTAR PEN: 20.0000 [IU] | PEN_INJECTOR | Freq: Every day | SUBCUTANEOUS | 11 refills | Status: DC

## 2016-04-18 MED ORDER — SODIUM CHLORIDE 0.9 % IV SOLN
INTRAVENOUS | Status: DC
  Administered 2016-04-18: 15:00:00 via INTRAVENOUS

## 2016-04-18 MED ORDER — SODIUM CHLORIDE 0.9 % IV BOLUS (SEPSIS): 500.0000 mL | Freq: Once | INTRAVENOUS | Status: AC

## 2016-04-18 NOTE — Discharge Instructions (Signed)
Pt received 500cc normal saline, blood drawn and cbg rechecked.

## 2016-04-18 NOTE — Progress Notes (Signed)
Subjective:    Patient ID: Troy Rush, male    DOB: March 26, 1971, 46 y.o.   MRN: [id number]  Diabetes  He presents for his follow-up diabetic visit. He has type 2 diabetes mellitus. No MedicAlert identification noted. His disease course has been worsening. Pertinent negatives for hypoglycemia include no confusion, dizziness, headaches, hunger, mood changes, nervousness/anxiousness, pallor, seizures, sleepiness or speech difficulty. Pertinent negatives for diabetes include no blurred vision, no chest pain, no fatigue, no foot paresthesias, no polydipsia, no polyphagia, no visual change, no weakness and no weight loss. Pertinent negatives for hypoglycemia complications include no hospitalization. Diabetic complications include peripheral neuropathy. Risk factors for coronary artery disease include diabetes mellitus and hypertension. Current diabetic treatment includes diet and oral agent (monotherapy). He is compliant with treatment most of the time. His weight is stable. He is following a high fat/cholesterol diet. When asked about meal planning, he reported none. He has not had a previous visit with a dietitian. He rarely participates in exercise. An ACE inhibitor/angiotensin II receptor blocker is not being taken. He does not see a podiatrist.Eye exam is not current.   Past Medical History:  Diagnosis Date  . Bronchitis   . CHF (congestive heart failure) ([place])   . Diabetes mellitus without complication ([place])   . Dyslipidemia   . Hypertension   . Pancreatitis    Social History   Social History  . Marital status: Married    Spouse name: N/A  . Number of children: N/A  . Years of education: N/A   Occupational History  . Not on file.   Social History Main Topics  . Smoking status: Never Smoker  . Smokeless tobacco: Never Used  . Alcohol use Yes     Comment: rare  . Drug use: No  . Sexual activity: Not on file   Other Topics Concern  . Not on file   Social History Narrative  . No  narrative on file   Immunization History  Administered Date(s) Administered  . Influenza,inj,Quad PF,36+ Mos 03/21/2016   Review of Systems  Constitutional: Negative.  Negative for fatigue and weight loss.  HENT: Negative.   Eyes: Negative.  Negative for blurred vision.  Respiratory: Negative.   Cardiovascular: Negative.  Negative for chest pain.  Gastrointestinal: Negative.   Endocrine: Negative for polydipsia and polyphagia.  Musculoskeletal: Negative.   Skin: Negative.  Negative for pallor.  Neurological: Positive for numbness. Negative for dizziness, seizures, speech difficulty, weakness and headaches.  Hematological: Negative.   Psychiatric/Behavioral: Negative.  Negative for confusion. The patient is not nervous/anxious.        Objective:   Physical Exam  Constitutional: He is oriented to person, place, and time. He appears well-developed and well-nourished.  HENT:  Head: Normocephalic and atraumatic.  Right Ear: External ear normal.  Left Ear: External ear normal.  Mouth/Throat: Oropharynx is clear and moist.  Eyes: Conjunctivae and EOM are normal. Pupils are equal, round, and reactive to light.  Neck: Normal range of motion. Neck supple.  Pulmonary/Chest: Effort normal and breath sounds normal.  Abdominal: Soft. Bowel sounds are normal.  Neurological: He is alert and oriented to person, place, and time. He has normal reflexes.  Skin: Skin is warm and dry.  Psychiatric: He has a normal mood and affect. His behavior is normal. Judgment and thought content normal.       BP (!) 137/91 (BP Location: Left Arm, Patient Position: Sitting, Cuff Size: Large)   Pulse 88   Temp 98.3 F (36.8  C) (Oral)   Resp 18   Ht 5\' 9"  (1.753 m)   Wt 198 lb (89.8 kg)   SpO2 100%   BMI 29.24 kg/m  Assessment & Plan:   1. Type 2 diabetes mellitus with complication, unspecified long term insulin use status ([place]) Counseled on diet and medication regimen at length. Hemoglobin a1C is  markedly elevated. He does not check blood sugars consistently and does not follow a carbohydrate modified diet. Added Januvia to medication regimen and Lantus 20 units HS. Patient is to follow up in office in 2 weeks. He was instructed to check blood sugars prior to meals and at bedtime. Reviewed BMP, GFR is > 60, will continue Metformin.  - HgB A1c - Insulin Glargine (LANTUS SOLOSTAR) 100 UNIT/ML Solostar Pen; Inject 20 Units into the skin daily at 10 pm.  Dispense: 15 mL; Refill: 11 - INSULIN SYRINGE .5CC/29G (B-D INS SYR ULTRAFINE .5CC/29G) 29G X 1/2" 0.5 ML MISC; 1 each by Does not apply route 3 (three) times daily before meals.  Dispense: 200 each; Refill: 2 - sitaGLIPtin (JANUVIA) 50 MG tablet; Take 1 tablet (50 mg total) by mouth daily.  Dispense: 30 tablet; Refill: 2 - Ambulatory referral to Ophthalmology - Ambulatory referral to diabetic education - insulin regular (NOVOLIN R,HUMULIN R) 250 units/2.49mL (100 units/mL) injection 10 Units; Inject 0.1 mLs (10 Units total) into the skin once.  2. Hyperglycemia Patient' blood sugar is 360. He will transition to the day infusion center for normal saline bolus and   3. Essential hypertension Blood pressure is at <140/90, which is at goal. Will defer to cardiology for further treatment and evaluation.    RTC: 2 weeks for uncontrolled DMII  Oneika Simonian M, FNP

## 2016-04-18 NOTE — Progress Notes (Signed)
Diagnosis: Hyperglycemia  Provider: Lorel Monaco, NP  Procedure: Pt is deaf and an interpretor was used.  Pt  received 500ccs ns via piv. Pt had cbg done at the end of the 500ccs and NP was notified of result. Pt also had blood drawn for bmp.  Pt tolerated well.  Post procedure: IV was discontinued.  Pt alert, oriented and ambulatory at discharge. D/C instructions given to patient with verbal understanding.

## 2016-04-18 NOTE — Patient Instructions (Addendum)
1. Will start Lantus 20 units before bed. 2. Check blood sugars prior to meals. Bring meter to follow up appointment 3. Will add Janumet to medication regimen 50 mg daily with breakfast 4. Will check kidney functioning in order to continue Metformin 5. Will transition to infusion clinic for fluid bolus 6. Received Novolin R 10 units 7. Return in 2 weeks for fasting blood sugar   Diabetes Mellitus and Exercise Exercising regularly is important for your overall health, especially when you have diabetes (diabetes mellitus). Exercising is not only about losing weight. It has many health benefits, such as increasing muscle strength and bone density and reducing body fat and stress. This leads to improved fitness, flexibility, and endurance, all of which result in better overall health. Exercise has additional benefits for people with diabetes, including:  Reducing appetite.  Helping to lower and control blood glucose.  Lowering blood pressure.  Helping to control amounts of fatty substances (lipids) in the blood, such as cholesterol and triglycerides.  Helping the body to respond better to insulin (improving insulin sensitivity).  Reducing how much insulin the body needs.  Decreasing the risk for heart disease by:  Lowering cholesterol and triglyceride levels.  Increasing the levels of good cholesterol.  Lowering blood glucose levels. What is my activity plan? Your health care provider or certified diabetes educator can help you make a plan for the type and frequency of exercise (activity plan) that works for you. Make sure that you:  Do at least 150 minutes of moderate-intensity or vigorous-intensity exercise each week. This could be brisk walking, biking, or water aerobics.  Do stretching and strength exercises, such as yoga or weightlifting, at least 2 times a week.  Spread out your activity over at least 3 days of the week.  Get some form of physical activity every day.  Do  not go more than 2 days in a row without some kind of physical activity.  Avoid being inactive for more than 90 minutes at a time. Take frequent breaks to walk or stretch.  Choose a type of exercise or activity that you enjoy, and set realistic goals.  Start slowly, and gradually increase the intensity of your exercise over time. What do I need to know about managing my diabetes?  Check your blood glucose before and after exercising.  If your blood glucose is higher than 240 mg/dL (13.3 mmol/L) before you exercise, check your urine for ketones. If you have ketones in your urine, do not exercise until your blood glucose returns to normal.  Know the symptoms of low blood glucose (hypoglycemia) and how to treat it. Your risk for hypoglycemia increases during and after exercise. Common symptoms of hypoglycemia can include:  Hunger.  Anxiety.  Sweating and feeling clammy.  Confusion.  Dizziness or feeling light-headed.  Increased heart rate or palpitations.  Blurry vision.  Tingling or numbness around the mouth, lips, or tongue.  Tremors or shakes.  Irritability.  Keep a rapid-acting carbohydrate snack available before, during, and after exercise to help prevent or treat hypoglycemia.  Avoid injecting insulin into areas of the body that are going to be exercised. For example, avoid injecting insulin into:  The arms, when playing tennis.  The legs, when jogging.  Keep records of your exercise habits. Doing this can help you and your health care provider adjust your diabetes management plan as needed. Write down:  Food that you eat before and after you exercise.  Blood glucose levels before and after you exercise.  The type and amount of exercise you have done.  When your insulin is expected to peak, if you use insulin. Avoid exercising at times when your insulin is peaking.  When you start a new exercise or activity, work with your health care provider to make sure the  activity is safe for you, and to adjust your insulin, medicines, or food intake as needed.  Drink plenty of water while you exercise to prevent dehydration or heat stroke. Drink enough fluid to keep your urine clear or pale yellow. This information is not intended to replace advice given to you by your health care provider. Make sure you discuss any questions you have with your health care provider. Document Released: 06/18/2003 Document Revised: 10/16/2015 Document Reviewed: 09/07/2015 Elsevier Interactive Patient Education  2017 [place]. Diabetes Mellitus and Food It is important for you to manage your blood sugar (glucose) level. Your blood glucose level can be greatly affected by what you eat. Eating healthier foods in the appropriate amounts throughout the day at about the same time each day will help you control your blood glucose level. It can also help slow or prevent worsening of your diabetes mellitus. Healthy eating may even help you improve the level of your blood pressure and reach or maintain a healthy weight. General recommendations for healthful eating and cooking habits include:  Eating meals and snacks regularly. Avoid going long periods of time without eating to lose weight.  Eating a diet that consists mainly of plant-based foods, such as fruits, vegetables, nuts, legumes, and whole grains.  Using low-heat cooking methods, such as baking, instead of high-heat cooking methods, such as deep frying. Work with your dietitian to make sure you understand how to use the Nutrition Facts information on food labels. How can food affect me? Carbohydrates  Carbohydrates affect your blood glucose level more than any other type of food. Your dietitian will help you determine how many carbohydrates to eat at each meal and teach you how to count carbohydrates. Counting carbohydrates is important to keep your blood glucose at a healthy level, especially if you are using insulin or taking  certain medicines for diabetes mellitus. Alcohol  Alcohol can cause sudden decreases in blood glucose (hypoglycemia), especially if you use insulin or take certain medicines for diabetes mellitus. Hypoglycemia can be a life-threatening condition. Symptoms of hypoglycemia (sleepiness, dizziness, and disorientation) are similar to symptoms of having too much alcohol. If your health care provider has given you approval to drink alcohol, do so in moderation and use the following guidelines:  Women should not have more than one drink per day, and men should not have more than two drinks per day. One drink is equal to:  12 oz of beer.  5 oz of wine.  1 oz of hard liquor.  Do not drink on an empty stomach.  Keep yourself hydrated. Have water, diet soda, or unsweetened iced tea.  Regular soda, juice, and other mixers might contain a lot of carbohydrates and should be counted. What foods are not recommended? As you make food choices, it is important to remember that all foods are not the same. Some foods have fewer nutrients per serving than other foods, even though they might have the same number of calories or carbohydrates. It is difficult to get your body what it needs when you eat foods with fewer nutrients. Examples of foods that you should avoid that are high in calories and carbohydrates but low in nutrients include:  Trans fats (  most processed foods list trans fats on the Nutrition Facts label).  Regular soda.  Juice.  Candy.  Sweets, such as cake, pie, doughnuts, and cookies.  Fried foods. What foods can I eat? Eat nutrient-rich foods, which will nourish your body and keep you healthy. The food you should eat also will depend on several factors, including:  The calories you need.  The medicines you take.  Your weight.  Your blood glucose level.  Your blood pressure level.  Your cholesterol level. You should eat a variety of foods, including:  Protein.  Lean cuts of  meat.  Proteins low in saturated fats, such as fish, egg whites, and beans. Avoid processed meats.  Fruits and vegetables.  Fruits and vegetables that may help control blood glucose levels, such as apples, mangoes, and yams.  Dairy products.  Choose fat-free or low-fat dairy products, such as milk, yogurt, and cheese.  Grains, bread, pasta, and rice.  Choose whole grain products, such as multigrain bread, whole oats, and brown rice. These foods may help control blood pressure.  Fats.  Foods containing healthful fats, such as nuts, avocado, olive oil, canola oil, and fish. Does everyone with diabetes mellitus have the same meal plan? Because every person with diabetes mellitus is different, there is not one meal plan that works for everyone. It is very important that you meet with a dietitian who will help you create a meal plan that is just right for you. This information is not intended to replace advice given to you by your health care provider. Make sure you discuss any questions you have with your health care provider. Document Released: 12/23/2004 Document Revised: 09/03/2015 Document Reviewed: 02/22/2013 Elsevier Interactive Patient Education  2017 [employer].

## 2016-04-19 LAB — POCT URINALYSIS DIP (DEVICE)
BILIRUBIN URINE: NEGATIVE
GLUCOSE, UA: 500 mg/dL — AB
Ketones, ur: NEGATIVE mg/dL
LEUKOCYTES UA: NEGATIVE
NITRITE: NEGATIVE
Protein, ur: 30 mg/dL — AB
Specific Gravity, Urine: 1.01 (ref 1.005–1.030)
UROBILINOGEN UA: 0.2 mg/dL (ref 0.0–1.0)
pH: 5 (ref 5.0–8.0)

## 2016-04-20 ENCOUNTER — Ambulatory Visit (HOSPITAL_COMMUNITY)
Admission: RE | Admit: 2016-04-20 | Discharge: 2016-04-20 | Disposition: A | Discharge status: Home / Self Care | Payer: BLUE CROSS/BLUE SHIELD | Source: Ambulatory Visit | Attending: Cardiology | Admitting: Cardiology

## 2016-04-20 DIAGNOSIS — I340 Nonrheumatic mitral (valve) insufficiency: Secondary | ICD-10-CM | POA: Diagnosis not present

## 2016-04-20 DIAGNOSIS — I429 Cardiomyopathy, unspecified: Secondary | ICD-10-CM | POA: Diagnosis not present

## 2016-04-20 DIAGNOSIS — I5022 Chronic systolic (congestive) heart failure: Secondary | ICD-10-CM | POA: Diagnosis not present

## 2016-04-20 MED ORDER — GADOBENATE DIMEGLUMINE 529 MG/ML IV SOLN
20.0000 mL | Freq: Once | INTRAVENOUS | Status: AC | PRN
  Administered 2016-04-20: 20 mL via INTRAVENOUS

## 2016-05-05 ENCOUNTER — Other Ambulatory Visit: Payer: Self-pay | Admitting: Family Medicine

## 2016-05-05 ENCOUNTER — Encounter: Payer: Self-pay | Admitting: Family Medicine

## 2016-05-05 ENCOUNTER — Ambulatory Visit (INDEPENDENT_AMBULATORY_CARE_PROVIDER_SITE_OTHER): Payer: BLUE CROSS/BLUE SHIELD | Admitting: Family Medicine

## 2016-05-05 VITALS — BP 132/81 | Temp 97.9°F | Resp 14 | Ht 69.0 in | Wt 206.0 lb

## 2016-05-05 DIAGNOSIS — E118 Type 2 diabetes mellitus with unspecified complications: Secondary | ICD-10-CM

## 2016-05-05 DIAGNOSIS — H9193 Unspecified hearing loss, bilateral: Secondary | ICD-10-CM | POA: Diagnosis not present

## 2016-05-05 LAB — GLUCOSE, CAPILLARY: Glucose-Capillary: 130 mg/dL — ABNORMAL HIGH (ref 65–99)

## 2016-05-05 NOTE — Patient Instructions (Addendum)
Diabetes Mellitus and Food It is important for you to manage your blood sugar (glucose) level. Your blood glucose level can be greatly affected by what you eat. Eating healthier foods in the appropriate amounts throughout the day at about the same time each day will help you control your blood glucose level. It can also help slow or prevent worsening of your diabetes mellitus. Healthy eating may even help you improve the level of your blood pressure and reach or maintain a healthy weight. General recommendations for healthful eating and cooking habits include:  Eating meals and snacks regularly. Avoid going long periods of time without eating to lose weight.  Eating a diet that consists mainly of plant-based foods, such as fruits, vegetables, nuts, legumes, and whole grains.  Using low-heat cooking methods, such as baking, instead of high-heat cooking methods, such as deep frying.  Work with your dietitian to make sure you understand how to use the Nutrition Facts information on food labels. How can food affect me? Carbohydrates Carbohydrates affect your blood glucose level more than any other type of food. Your dietitian will help you determine how many carbohydrates to eat at each meal and teach you how to count carbohydrates. Counting carbohydrates is important to keep your blood glucose at a healthy level, especially if you are using insulin or taking certain medicines for diabetes mellitus. Alcohol Alcohol can cause sudden decreases in blood glucose (hypoglycemia), especially if you use insulin or take certain medicines for diabetes mellitus. Hypoglycemia can be a life-threatening condition. Symptoms of hypoglycemia (sleepiness, dizziness, and disorientation) are similar to symptoms of having too much alcohol. If your health care provider has given you approval to drink alcohol, do so in moderation and use the following guidelines:  Women should not have more than one drink per day, and men  should not have more than two drinks per day. One drink is equal to: ? 12 oz of beer. ? 5 oz of wine. ? 1 oz of hard liquor.  Do not drink on an empty stomach.  Keep yourself hydrated. Have water, diet soda, or unsweetened iced tea.  Regular soda, juice, and other mixers might contain a lot of carbohydrates and should be counted.  What foods are not recommended? As you make food choices, it is important to remember that all foods are not the same. Some foods have fewer nutrients per serving than other foods, even though they might have the same number of calories or carbohydrates. It is difficult to get your body what it needs when you eat foods with fewer nutrients. Examples of foods that you should avoid that are high in calories and carbohydrates but low in nutrients include:  Trans fats (most processed foods list trans fats on the Nutrition Facts label).  Regular soda.  Juice.  Candy.  Sweets, such as cake, pie, doughnuts, and cookies.  Fried foods.  What foods can I eat? Eat nutrient-rich foods, which will nourish your body and keep you healthy. The food you should eat also will depend on several factors, including:  The calories you need.  The medicines you take.  Your weight.  Your blood glucose level.  Your blood pressure level.  Your cholesterol level.  You should eat a variety of foods, including:  Protein. ? Lean cuts of meat. ? Proteins low in saturated fats, such as fish, egg whites, and beans. Avoid processed meats.  Fruits and vegetables. ? Fruits and vegetables that may help control blood glucose levels, such as apples,   mangoes, and yams.  Dairy products. ? Choose fat-free or low-fat dairy products, such as milk, yogurt, and cheese.  Grains, bread, pasta, and rice. ? Choose whole grain products, such as multigrain bread, whole oats, and brown rice. These foods may help control blood pressure.  Fats. ? Foods containing healthful fats, such as  nuts, avocado, olive oil, canola oil, and fish.  Does everyone with diabetes mellitus have the same meal plan? Because every person with diabetes mellitus is different, there is not one meal plan that works for everyone. It is very important that you meet with a dietitian who will help you create a meal plan that is just right for you. This information is not intended to replace advice given to you by your health care provider. Make sure you discuss any questions you have with your health care provider. Document Released: 12/23/2004 Document Revised: 09/03/2015 Document Reviewed: 02/22/2013 Elsevier Interactive Patient Education  2017 [employer]  

## 2016-05-05 NOTE — Progress Notes (Signed)
Subjective:    Patient ID: Troy Rush, male    DOB: 04/27/1970, 46 y.o.   MRN: [id number]  Diabetes  He presents for his follow-up diabetic visit. He has type 2 diabetes mellitus. No MedicAlert identification noted. His disease course has been worsening. Pertinent negatives for hypoglycemia include no confusion, dizziness, headaches, hunger, mood changes, nervousness/anxiousness, pallor, seizures, sleepiness, speech difficulty, sweats or tremors. Pertinent negatives for diabetes include no blurred vision, no chest pain, no fatigue, no foot paresthesias, no foot ulcerations, no polydipsia, no polyphagia, no polyuria, no visual change, no weakness and no weight loss. Pertinent negatives for hypoglycemia complications include no hospitalization. Pertinent negatives for diabetic complications include no CVA or heart disease. Risk factors for coronary artery disease include diabetes mellitus and hypertension. Current diabetic treatment includes diet and oral agent (monotherapy). He is compliant with treatment most of the time. His weight is stable. He is following a high fat/cholesterol diet. When asked about meal planning, he reported none. He has not had a previous visit with a dietitian. He rarely participates in exercise. An ACE inhibitor/angiotensin II receptor blocker is not being taken. He does not see a podiatrist.Eye exam is not current.   Past Medical History:  Diagnosis Date  . Bronchitis   . CHF (congestive heart failure) ([place])   . Diabetes mellitus without complication ([place])   . Dyslipidemia   . Hypertension   . Pancreatitis    Social History   Social History  . Marital status: Legally Separated    Spouse name: N/A  . Number of children: N/A  . Years of education: N/A   Occupational History  . Not on file.   Social History Main Topics  . Smoking status: Never Smoker  . Smokeless tobacco: Never Used  . Alcohol use Yes     Comment: rare  . Drug use: No  . Sexual activity:  Not on file   Other Topics Concern  . Not on file   Social History Narrative  . No narrative on file   Immunization History  Administered Date(s) Administered  . Influenza,inj,Quad PF,36+ Mos 03/21/2016   Review of Systems  Constitutional: Negative.  Negative for fatigue and weight loss.  HENT: Negative.   Eyes: Negative.  Negative for blurred vision.  Respiratory: Negative.   Cardiovascular: Negative.  Negative for chest pain.  Gastrointestinal: Negative.   Endocrine: Negative for polydipsia, polyphagia and polyuria.  Musculoskeletal: Negative.   Skin: Negative.  Negative for pallor.  Neurological: Positive for numbness. Negative for dizziness, tremors, seizures, speech difficulty, weakness and headaches.  Hematological: Negative.   Psychiatric/Behavioral: Negative.  Negative for confusion. The patient is not nervous/anxious.        Objective:   Physical Exam  Constitutional: He is oriented to person, place, and time. He appears well-developed and well-nourished.  HENT:  Head: Normocephalic and atraumatic.  Right Ear: External ear normal.  Left Ear: External ear normal.  Mouth/Throat: Oropharynx is clear and moist.  Eyes: Conjunctivae and EOM are normal. Pupils are equal, round, and reactive to light.  Neck: Normal range of motion. Neck supple.  Pulmonary/Chest: Effort normal and breath sounds normal.  Abdominal: Soft. Bowel sounds are normal.  Neurological: He is alert and oriented to person, place, and time. He has normal reflexes.  Skin: Skin is warm and dry.  Psychiatric: He has a normal mood and affect. His behavior is normal. Judgment and thought content normal.      BP 132/81 (BP Location: Right Arm, Patient Position:  Sitting, Cuff Size: Large)   Temp 97.9 F (36.6 C) (Oral)   Resp 14   Ht 5\' 9"  (1.753 m)   Wt 206 lb (93.4 kg)   SpO2 100%   BMI 30.42 kg/m  Assessment & Plan:   1. Type 2 diabetes mellitus with complication, unspecified long term insulin  use status ([place]) Counseled on diet and medication regimen at length. Will check hemoglobin a1C in 2 months. Blood sugar is 130 post prandially. He has been checking blood sugar continuously. Added Januvia to medication regimen and Lantus 20 units HS.  Reviewed BMP, GFR is > 60, will continue Metformin.  Continue to check blood sugars prior to meals and at bed time. Bring glucometer to follow up appointment. Reviewed CBG.  -CBG  2. Hearing impaired person, bilateral Patient speaks sign language, was accompanied by interpreter  RTC: 1 month for uncontrolled DMII  Hollis,Lachina M, FNP     The patient was given clear instructions to go to ER or return to medical center if symptoms do not improve, worsen or new problems develop. The patient verbalized understanding. Will notify patient with laboratory results.

## 2016-05-09 ENCOUNTER — Encounter (HOSPITAL_COMMUNITY): Payer: Self-pay

## 2016-05-09 ENCOUNTER — Telehealth (HOSPITAL_COMMUNITY): Payer: Self-pay | Admitting: Pharmacist

## 2016-05-09 ENCOUNTER — Ambulatory Visit (HOSPITAL_COMMUNITY)
Admission: RE | Admit: 2016-05-09 | Discharge: 2016-05-09 | Disposition: A | Discharge status: Home / Self Care | Payer: BLUE CROSS/BLUE SHIELD | Source: Ambulatory Visit | Attending: Internal Medicine | Admitting: Internal Medicine

## 2016-05-09 VITALS — BP 170/102 | HR 87 | Wt 213.2 lb

## 2016-05-09 DIAGNOSIS — E875 Hyperkalemia: Secondary | ICD-10-CM | POA: Diagnosis not present

## 2016-05-09 DIAGNOSIS — I429 Cardiomyopathy, unspecified: Secondary | ICD-10-CM | POA: Insufficient documentation

## 2016-05-09 DIAGNOSIS — I110 Hypertensive heart disease with heart failure: Secondary | ICD-10-CM | POA: Insufficient documentation

## 2016-05-09 DIAGNOSIS — E119 Type 2 diabetes mellitus without complications: Secondary | ICD-10-CM | POA: Diagnosis not present

## 2016-05-09 DIAGNOSIS — F329 Major depressive disorder, single episode, unspecified: Secondary | ICD-10-CM | POA: Diagnosis not present

## 2016-05-09 DIAGNOSIS — Z794 Long term (current) use of insulin: Secondary | ICD-10-CM | POA: Diagnosis not present

## 2016-05-09 DIAGNOSIS — H9190 Unspecified hearing loss, unspecified ear: Secondary | ICD-10-CM | POA: Insufficient documentation

## 2016-05-09 DIAGNOSIS — H9193 Unspecified hearing loss, bilateral: Secondary | ICD-10-CM

## 2016-05-09 DIAGNOSIS — I428 Other cardiomyopathies: Secondary | ICD-10-CM | POA: Diagnosis not present

## 2016-05-09 DIAGNOSIS — E785 Hyperlipidemia, unspecified: Secondary | ICD-10-CM | POA: Diagnosis not present

## 2016-05-09 DIAGNOSIS — Z7982 Long term (current) use of aspirin: Secondary | ICD-10-CM | POA: Insufficient documentation

## 2016-05-09 DIAGNOSIS — I509 Heart failure, unspecified: Secondary | ICD-10-CM

## 2016-05-09 DIAGNOSIS — K529 Noninfective gastroenteritis and colitis, unspecified: Secondary | ICD-10-CM | POA: Insufficient documentation

## 2016-05-09 DIAGNOSIS — I5022 Chronic systolic (congestive) heart failure: Secondary | ICD-10-CM | POA: Insufficient documentation

## 2016-05-09 DIAGNOSIS — I10 Essential (primary) hypertension: Secondary | ICD-10-CM

## 2016-05-09 LAB — BASIC METABOLIC PANEL
Anion gap: 8 (ref 5–15)
BUN: 11 mg/dL (ref 6–20)
CALCIUM: 9.1 mg/dL (ref 8.9–10.3)
CO2: 27 mmol/L (ref 22–32)
CREATININE: 0.84 mg/dL (ref 0.61–1.24)
Chloride: 104 mmol/L (ref 101–111)
GFR calc Af Amer: >60 mL/min (ref >60)
GFR calc non Af Amer: >60 mL/min (ref >60)
Glucose, Bld: 169 mg/dL — ABNORMAL HIGH (ref 65–99)
Potassium: 4.7 mmol/L (ref 3.5–5.1)
SODIUM: 139 mmol/L (ref 135–145)

## 2016-05-09 MED ORDER — HYDRALAZINE HCL 25 MG PO TABS: 12.5000 mg | ORAL_TABLET | Freq: Three times a day (TID) | ORAL | 6 refills | Status: DC

## 2016-05-09 MED ORDER — ISOSORBIDE MONONITRATE ER 30 MG PO TB24: 30.0000 mg | ORAL_TABLET | Freq: Every day | ORAL | 6 refills | Status: DC

## 2016-05-09 NOTE — Progress Notes (Signed)
PCP: None Cardiology: Dr. Aundra Dubin  46 yo with deafness, HTN, DM, and chronic systolic CHF presents to establish cardiology care.  Patient had no known cardiac problems until 10/16.  At that time, he developed exertional dyspnea and was admitted to a hospital in [place], [place] with acute systolic CHF.  Echo 10/16 showed EF 15-20%.  Cardiac cath showed no significant coronary disease.  He was diuresed and started on cardiac meds. Subsequently, he moved to [facility].  Now working in a [employer].    Today he returns for HF follow up. Complaining of fatigue. Mild dyspnea with exertion. Denies PND/Orhtopnea. Appetite fair. No chest pain. Weight at home 190-198 pounds. Did not take meds this morning because of veltassa dose. Having a hard time working due to fatigue. Not drinking alcohol.   Of note, needs sign language interpreter.   ECG: NSR, LAE, poor RWP, narrow QRS  Labs (9/17): BNP 477, K 4.5 => 4.2, creatinine 0.92 => 1.1, BNP 1009, HIV negative, SPEP negative.  Labs (10/17): K 5.2, creatinine 1.10, hgb 12.5 Labs (03/28/2016): K 5.3 Creatinine 1.07   PMH: 1. HTN 2. Diabetes 3. Hyperlipidemia 4. Deafness since age 14: Needs sign language interpreter.  5. Chronic systolic CHF: Nonischemic cardiomyopathy.  Diagnosis 10/16 in [place], [place] (admitted with acute systolic CHF).  SPEP and HIV negative.  - Echo (10/16): EF 15-20%, mild LVH, mild MR, normal RV size and systolic function.  - LHC (10/16): No significant CAD.  6. Depression. 7. Colitis episodes.  8. 04/2016 CMRI- EF 27%. RV normal. No evidence of infiltrative disease, myocarditis, or MI.    SH: From [place] originally.  Lives in [place] until 2017, then moved to [place].  Nonsmoker.  Used to drink 3-4 ETOH beverages/day until 10/16, now stopped. Works in [employer].   FH: No known cardiac disease.   ROS: All systems reviewed and negative except as per HPI.   Current Outpatient Prescriptions  Medication Sig Dispense Refill  .  aspirin 81 MG chewable tablet Chew 81 mg by mouth daily.    Marland Kitchen atorvastatin (LIPITOR) 80 MG tablet Take 1 tablet (80 mg total) by mouth daily. 30 tablet 3  . blood glucose meter kit and supplies KIT Dispense based on patient and insurance preference. Use up to four times daily as directed. (FOR ICD-9 250.00, 250.01). 1 each 0  . carvedilol (COREG) 6.25 MG tablet Take 1 tablet (6.25 mg total) by mouth 2 (two) times daily with a meal. 60 tablet 3  . citalopram (CELEXA) 40 MG tablet Take 1 tablet (40 mg total) by mouth daily. 90 tablet 1  . digoxin (LANOXIN) 0.125 MG tablet Take 1 tablet (0.125 mg total) by mouth daily. 30 tablet 3  . furosemide (LASIX) 40 MG tablet Take 1 tablet (40 mg total) by mouth daily. 30 tablet 6  . Insulin Glargine (LANTUS SOLOSTAR) 100 UNIT/ML Solostar Pen Inject 20 Units into the skin daily at 10 pm. 15 mL 11  . INSULIN SYRINGE .5CC/29G (B-D INS SYR ULTRAFINE .5CC/29G) 29G X 1/2" 0.5 ML MISC 1 each by Does not apply route 3 (three) times daily before meals. 200 each 2  . ivabradine (CORLANOR) 5 MG TABS tablet Take 1 tablet (5 mg total) by mouth 2 (two) times daily with a meal. 60 tablet 3  . metFORMIN (GLUCOPHAGE) 1000 MG tablet Take 1 tablet (1,000 mg total) by mouth 2 (two) times daily with a meal. 180 tablet 3  . Patiromer Sorbitex Calcium (VELTASSA) 16.8 g PACK Take  by mouth.    . promethazine (PHENERGAN) 25 MG tablet Take 1 tablet (25 mg total) by mouth every 6 (six) hours as needed for nausea or vomiting. 30 tablet 0  . sacubitril-valsartan (ENTRESTO) 24-26 MG Take 1 tablet by mouth 2 (two) times daily. 60 tablet 3  . sitaGLIPtin (JANUVIA) 50 MG tablet Take 1 tablet (50 mg total) by mouth daily. 30 tablet 2  . spironolactone (ALDACTONE) 25 MG tablet Take 1 tablet (25 mg total) by mouth daily. 30 tablet 3   No current facility-administered medications for this encounter.    BP (!) 170/102 (BP Location: Right Arm, Patient Position: Sitting, Cuff Size: Normal)   Pulse  87   Wt 213 lb 3.2 oz (96.7 kg)   SpO2 100%   BMI 31.48 kg/m  General: Appears fatigued. Ambulated slowly in the clinic.  Neck: JVP ~10-11. No thyromegaly or thyroid nodule.  Lungs: CTAB. CV: Nondisplaced PMI.  Heart rregular S1/S2, No S3/S4.   No carotid bruit.  Normal pedal pulses.  Abdomen: soft, nontender, no hepatosplenomegaly, no distention.  Skin: Intact without lesions or rashes.  Neurologic: Alert and oriented x 3.  Psych: Normal affect. Extremities: No clubbing or cyanosis. Warm R and LLE 1+ edema HEENT: Normal.   Assessment/Plan: 1. Chronic systolic CHF: Nonischemic cardiomyopathy, etiology uncertain.  Consider prior ETOH or HTN.   CMRI- EF 27%. RV normal. No evidence of infiltrative disease, myocarditis, or MI. Today I discussed the CMRI, He understands that we will try to optimize HF meds then repeat ECHO. May need ICD if EF remains < 35%.  - NYHA IIIb. Volume status elevated. Increase lasix to 40 mg twice a day for 2 days then lasix back to lasix 40 mg daily.   - Continue coreg to 6.25 mg twice a day.  - Continue entresto 24-26 mg twice a day.     - Continue spironolactone 25 mg daily.   -Add hydralazine 12.5 mg three times a day and imdur 30 mg daily.  - Continue digoxin 0.125 daily.  - CPX- mild HF limitation .   - Check BMET today.  2. Type II diabetes: Needs PCP, have enlisted Education officer, [employer] to help.   3. HTN: BP elevated but he has not had BP meds. As above add hydralazine/imdur.   4. Deafness: He will need a sign language interpreter scheduled for his followup visits. Present today for the follow up.  5. Hyperkalemia- Continue veltassa 16.8. Repeat BMET today.      Today he was provided with letter for work and court appearance. Follow up in 2 weeks to reassess volume status.     Shalaina Guardiola NP-C  05/09/2016

## 2016-05-09 NOTE — Patient Instructions (Addendum)
INCREASE lasix to 40 mg TWICE daily for TWO days. Then, resume back to normal dose of 40 mg ONCE daily.  START Hydralazine 12.5 mg (1/2 tablet) three times daily (Once every 8 hours).  START Imdur 30 mg tablet once daily.  Routine lab work today. Will notify you of abnormal results, otherwise no news is good news!  Follow up 2 weeks with Amy Clegg NP-C.  Do the following things EVERYDAY: 1) Weigh yourself in the morning before breakfast. Write it down and keep it in a log. 2) Take your medicines as prescribed 3) Eat low salt foods-Limit salt (sodium) to 2000 mg per day.  4) Stay as active as you can everyday 5) Limit all fluids for the day to less than 2 liters

## 2016-05-09 NOTE — Telephone Encounter (Signed)
Entresto 24-26 mg BID PA approved by [employer] through 05/11/17.   Ruta Hinds. Velva Harman, PharmD, BCPS, CPP Clinical Pharmacist Pager: [phone] Phone: [phone] 05/09/2016 2:27 PM

## 2016-05-17 ENCOUNTER — Encounter: Payer: Self-pay | Admitting: Dietician

## 2016-05-17 ENCOUNTER — Encounter: Payer: BLUE CROSS/BLUE SHIELD | Attending: Family Medicine | Admitting: Dietician

## 2016-05-17 DIAGNOSIS — E119 Type 2 diabetes mellitus without complications: Secondary | ICD-10-CM | POA: Insufficient documentation

## 2016-05-17 DIAGNOSIS — E118 Type 2 diabetes mellitus with unspecified complications: Secondary | ICD-10-CM

## 2016-05-17 DIAGNOSIS — I110 Hypertensive heart disease with heart failure: Secondary | ICD-10-CM | POA: Insufficient documentation

## 2016-05-17 DIAGNOSIS — I509 Heart failure, unspecified: Secondary | ICD-10-CM | POA: Insufficient documentation

## 2016-05-17 DIAGNOSIS — Z713 Dietary counseling and surveillance: Secondary | ICD-10-CM | POA: Diagnosis not present

## 2016-05-17 NOTE — Patient Instructions (Addendum)
Avoid adding salt to your food. Avoid processed meats and pickles. Continue to avoid any beverage with sugar.  Begin to check your blood sugar 4 times daily as your doctor wishes. Keep an active lifestyle.  Aim for 30-60 minutes most days. Choose foods that are baked or grilled rather than fried most often. Eat more non starchy vegetables.

## 2016-05-17 NOTE — Progress Notes (Signed)
Diabetes Self-Management Education  Visit Type: First/Initial  Appt. Start Time: 1045 Appt. End Time: 3818  05/17/2016  [name], identified by name and date of birth, is a 46 y.o. male with a diagnosis of Diabetes: Type 2. Other hx includes CHF and HTN.  He typically does not like to check his blood sugar because he does not like to see the numbers.  His A1C has bee >14 for the past 3 months.  He states that at one time his A1C was 5% and he was having a lot of low blood sugar readings so the meal time insulin was discontinued.  He moved from [place] about 6 months ago and did not have insurance initially so was off his medications for a time.  He is currently taking 20 units of Lantus q HS, Metformin, and Januvia.  Patient lives with a roommate.  He does his own shopping and cooking but eats at work on his work days.  He is a Training and development officer at [employer].  He walks to the bus stop on work days and this is his main exercise.  ASSESSMENT  Weight 211 lb (95.7 kg).  He has reported that he has gained weight recently. Body mass index is 31.16 kg/m.      Diabetes Self-Management Education - 05/17/16 1107      Visit Information   Visit Type First/Initial     Initial Visit   Diabetes Type Type 2   Are you currently following a meal plan? No   Are you taking your medications as prescribed? Yes   Date Diagnosed 2008     Health Coping   How would you rate your overall health? Poor     Psychosocial Assessment   Patient Belief/Attitude about Diabetes Denial   Self-care barriers Other (comment)  deaf   Self-management support Doctor's office   Other persons present Patient;Interpreter  Interpretor from  [facility], [name]   Patient Concerns Glycemic Control;Monitoring  Interpretor   Special Needs Other (comment)  Interpretor   Preferred Learning Style Visual   Learning Readiness Ready   How often do you need to have someone help you when you read instructions, pamphlets, or other  written materials from your doctor or pharmacy? 3 - Sometimes   What is the last grade level you completed in school? 12th grade     Pre-Education Assessment   Patient understands the diabetes disease and treatment process. Needs Review   Patient understands incorporating nutritional management into lifestyle. Needs Review   Patient undertands incorporating physical activity into lifestyle. Needs Review   Patient understands using medications safely. Needs Review   Patient understands monitoring blood glucose, interpreting and using results Needs Review   Patient understands prevention, detection, and treatment of acute complications. Needs Review   Patient understands prevention, detection, and treatment of chronic complications. Needs Review   Patient understands how to develop strategies to address psychosocial issues. Needs Review   Patient understands how to develop strategies to promote health/change behavior. Needs Review     Complications   Last HgB A1C per patient/outside source 14.8 %   How often do you check your blood sugar? 3-4 times / week  maximum.  Patient does not like to check as he does not like the numbers.   Number of hypoglycemic episodes per month 0   Number of hyperglycemic episodes per week 0   Have you had a dilated eye exam in the past 12 months? Yes   Have you had  a dental exam in the past 12 months? No   Are you checking your feet? Yes   How many days per week are you checking your feet? 7     Dietary Intake   Breakfast skips   Snack (morning) none   Lunch hamburger or salad or something else from the [employer] cafeteria  1   Snack (afternoon) none   Dinner something from the [employer] caf[employer]ua- beans, fish   Snack (evening) none   Beverage(s) water, diet coke, unsweetened hot tea with splenda     Exercise   Exercise Type Light (walking / raking leaves)   How many days per week to you exercise? 5   How many minutes per day do you exercise? 45   Total minutes  per week of exercise 225     Patient Education   Previous Diabetes Education Yes (please comment)  10 years ago when diagnosed   Disease state  Definition of diabetes, type 1 and 2, and the diagnosis of diabetes;Explored patient's options for treatment of their diabetes   Nutrition management  Role of diet in the treatment of diabetes and the relationship between the three main macronutrients and blood glucose level;Food label reading, portion sizes and measuring food.;Meal options for control of blood glucose level and chronic complications.   Physical activity and exercise  Role of exercise on diabetes management, blood pressure control and cardiac health.;Helped patient identify appropriate exercises in relation to his/her diabetes, diabetes complications and other health issue.   Medications Reviewed patients medication for diabetes, action, purpose, timing of dose and side effects.   Monitoring Identified appropriate SMBG and/or A1C goals.;Purpose and frequency of SMBG.;Yearly dilated eye exam;Daily foot exams   Chronic complications Relationship between chronic complications and blood glucose control;Retinopathy and reason for yearly dilated eye exams   Psychosocial adjustment Worked with patient to identify barriers to care and solutions;Role of stress on diabetes;Identified and addressed patients feelings and concerns about diabetes   Personal strategies to promote health Lifestyle issues that need to be addressed for better diabetes care     Individualized Goals (developed by patient)   Nutrition General guidelines for healthy choices and portions discussed   Physical Activity Exercise 5-7 days per week;45 minutes per day   Monitoring  test my blood glucose as discussed   Reducing Risk Other (comment)  avoid added salt, reduce sodium intake   Health Coping discuss diabetes with (comment)  RD/MD     Post-Education Assessment   Patient understands the diabetes disease and treatment  process. Demonstrates understanding / competency   Patient understands incorporating nutritional management into lifestyle. Needs Review   Patient undertands incorporating physical activity into lifestyle. Demonstrates understanding / competency   Patient understands using medications safely. Demonstrates understanding / competency   Patient understands monitoring blood glucose, interpreting and using results Needs Review   Patient understands prevention, detection, and treatment of acute complications. Needs Review   Patient understands prevention, detection, and treatment of chronic complications. Needs Review   Patient understands how to develop strategies to address psychosocial issues. Needs Review   Patient understands how to develop strategies to promote health/change behavior. Needs Review     Outcomes   Expected Outcomes Demonstrated interest in learning. Expect positive outcomes   Future DMSE 3-4 months   Program Status Completed      Individualized Plan for Diabetes Self-Management Training:   Learning Objective:  Patient will have a greater understanding of diabetes self-management. Patient education plan is to attend  individual and/or group sessions per assessed needs and concerns.   Plan:   Patient Instructions  Avoid adding salt to your food. Avoid processed meats and pickles. Continue to avoid any beverage with sugar.  Begin to check your blood sugar 4 times daily as your doctor wishes. Keep an active lifestyle.  Aim for 30-60 minutes most days. Choose foods that are baked or grilled rather than fried most often. Eat more non starchy vegetables.    Expected Outcomes:  Demonstrated interest in learning. Expect positive outcomes  Education material provided: Living Well with Diabetes, Food label handouts, A1C conversion sheet, Meal plan card and My Plate  If problems or questions, patient to contact team via:  Phone and Email  Future DSME appointment: 3-4  months

## 2016-05-30 ENCOUNTER — Encounter (HOSPITAL_COMMUNITY): Payer: Self-pay

## 2016-05-30 ENCOUNTER — Ambulatory Visit (HOSPITAL_COMMUNITY)
Admission: RE | Admit: 2016-05-30 | Discharge: 2016-05-30 | Disposition: A | Discharge status: Home / Self Care | Payer: BLUE CROSS/BLUE SHIELD | Source: Ambulatory Visit | Attending: Cardiology | Admitting: Cardiology

## 2016-05-30 VITALS — BP 107/82 | HR 82 | Wt 207.0 lb

## 2016-05-30 DIAGNOSIS — E785 Hyperlipidemia, unspecified: Secondary | ICD-10-CM | POA: Diagnosis not present

## 2016-05-30 DIAGNOSIS — F329 Major depressive disorder, single episode, unspecified: Secondary | ICD-10-CM | POA: Insufficient documentation

## 2016-05-30 DIAGNOSIS — E875 Hyperkalemia: Secondary | ICD-10-CM | POA: Diagnosis not present

## 2016-05-30 DIAGNOSIS — H9193 Unspecified hearing loss, bilateral: Secondary | ICD-10-CM

## 2016-05-30 DIAGNOSIS — I5022 Chronic systolic (congestive) heart failure: Secondary | ICD-10-CM | POA: Insufficient documentation

## 2016-05-30 DIAGNOSIS — I110 Hypertensive heart disease with heart failure: Secondary | ICD-10-CM | POA: Diagnosis not present

## 2016-05-30 DIAGNOSIS — K529 Noninfective gastroenteritis and colitis, unspecified: Secondary | ICD-10-CM | POA: Insufficient documentation

## 2016-05-30 DIAGNOSIS — H9190 Unspecified hearing loss, unspecified ear: Secondary | ICD-10-CM | POA: Diagnosis not present

## 2016-05-30 DIAGNOSIS — I10 Essential (primary) hypertension: Secondary | ICD-10-CM | POA: Insufficient documentation

## 2016-05-30 DIAGNOSIS — Z7982 Long term (current) use of aspirin: Secondary | ICD-10-CM | POA: Insufficient documentation

## 2016-05-30 DIAGNOSIS — E118 Type 2 diabetes mellitus with unspecified complications: Secondary | ICD-10-CM

## 2016-05-30 DIAGNOSIS — E119 Type 2 diabetes mellitus without complications: Secondary | ICD-10-CM | POA: Diagnosis not present

## 2016-05-30 DIAGNOSIS — Z794 Long term (current) use of insulin: Secondary | ICD-10-CM | POA: Insufficient documentation

## 2016-05-30 DIAGNOSIS — I429 Cardiomyopathy, unspecified: Secondary | ICD-10-CM | POA: Diagnosis not present

## 2016-05-30 LAB — BASIC METABOLIC PANEL
Anion gap: 10 (ref 5–15)
BUN: 23 mg/dL — AB (ref 6–20)
CO2: 28 mmol/L (ref 22–32)
Calcium: 9.8 mg/dL (ref 8.9–10.3)
Chloride: 102 mmol/L (ref 101–111)
Creatinine, Ser: 1.34 mg/dL — ABNORMAL HIGH (ref 0.61–1.24)
Glucose, Bld: 67 mg/dL (ref 65–99)
POTASSIUM: 5 mmol/L (ref 3.5–5.1)
SODIUM: 140 mmol/L (ref 135–145)

## 2016-05-30 LAB — BRAIN NATRIURETIC PEPTIDE: B NATRIURETIC PEPTIDE 5: 46.8 pg/mL (ref 0.0–100.0)

## 2016-05-30 MED ORDER — HYDRALAZINE HCL 25 MG PO TABS: 25.0000 mg | ORAL_TABLET | Freq: Three times a day (TID) | ORAL | 3 refills | Status: DC

## 2016-05-30 NOTE — Patient Instructions (Signed)
Routine lab work today. Will notify you of abnormal results, otherwise no news is good news!  INCREASE Hydralazine to 25 mg (1 whole tablet) three times daily (once every 8 hours). New prescription for 90 day supply with 3 refills sent to your pharmacy.  Follow up 3-4 weeks with Heart Failure Pharmacist Ileene Patrick.  Follow up 3 months with echocardiogram and appointment with Dr. Aundra Dubin.  Do the following things EVERYDAY: 1) Weigh yourself in the morning before breakfast. Write it down and keep it in a log. 2) Take your medicines as prescribed 3) Eat low salt foods-Limit salt (sodium) to 2000 mg per day.  4) Stay as active as you can everyday 5) Limit all fluids for the day to less than 2 liters

## 2016-05-30 NOTE — Progress Notes (Signed)
Advanced Heart Failure Clinic Note   PCP: None Cardiology: Dr. Aundra Dubin  46 yo with deafness, HTN, DM, and chronic systolic CHF presents to establish cardiology care.  Patient had no known cardiac problems until 10/16.  At that time, he developed exertional dyspnea and was admitted to a hospital in [place], [place] with acute systolic CHF.  Echo 10/16 showed EF 15-20%.  Cardiac cath showed no significant coronary disease.  He was diuresed and started on cardiac meds. Subsequently, he moved to [facility].  Now working in a [employer].    He returns for regular follow up. Sign language interpreter present. At last visit started on hydralazine 12.5 mg TID and given 2 days of extra lasix. Weight down 6 lbs from that visit. Breathing has been OK. Can walk about 30 minutes to the bus stop daily without much SOB. No SOB with ADLs such as bathing, changing clothes, or getting around the house.  Does occasionally have very mild orthopnea with lying flat. Sleeps on 2 pillows.  Denies CP, lightheadedness or dizziness. Weight at home ~198 200 lbs.  Took all of his medication this am. Continues to work in the [employer] Denies ETOH use. Does not smoke. Still has fatigue, but having more good days.   Of note, needs sign language interpreter.   ECG: NSR, LAE, poor RWP, narrow QRS  Labs (9/17): BNP 477, K 4.5 => 4.2, creatinine 0.92 => 1.1, BNP 1009, HIV negative, SPEP negative.  Labs (10/17): K 5.2, creatinine 1.10, hgb 12.5 Labs (03/28/2016): K 5.3 Creatinine 1.07   PMH: 1. HTN 2. Diabetes 3. Hyperlipidemia 4. Deafness since age 66: Needs sign language interpreter.  5. Chronic systolic CHF: Nonischemic cardiomyopathy.  Diagnosis 10/16 in [place], [place] (admitted with acute systolic CHF).  SPEP and HIV negative.  - Echo (10/16): EF 15-20%, mild LVH, mild MR, normal RV size and systolic function.  - LHC (10/16): No significant CAD.  6. Depression. 7. Colitis episodes.  8. 04/2016 CMRI- EF 27%. RV normal.  No evidence of infiltrative disease, myocarditis, or MI.    SH: From [place] originally.  Lives in [place] until 2017, then moved to [place].  Nonsmoker.  Used to drink 3-4 ETOH beverages/day until 10/16, now stopped. Works in [employer].   FH: No known cardiac disease.   ROS: All systems reviewed and negative except as per HPI.   Current Outpatient Prescriptions  Medication Sig Dispense Refill  . aspirin 81 MG chewable tablet Chew 81 mg by mouth daily.    Marland Kitchen atorvastatin (LIPITOR) 80 MG tablet Take 1 tablet (80 mg total) by mouth daily. 30 tablet 3  . blood glucose meter kit and supplies KIT Dispense based on patient and insurance preference. Use up to four times daily as directed. (FOR ICD-9 250.00, 250.01). 1 each 0  . carvedilol (COREG) 6.25 MG tablet Take 1 tablet (6.25 mg total) by mouth 2 (two) times daily with a meal. 60 tablet 3  . citalopram (CELEXA) 40 MG tablet Take 1 tablet (40 mg total) by mouth daily. 90 tablet 1  . digoxin (LANOXIN) 0.125 MG tablet Take 1 tablet (0.125 mg total) by mouth daily. 30 tablet 3  . furosemide (LASIX) 40 MG tablet Take 1 tablet (40 mg total) by mouth daily. 30 tablet 6  . hydrALAZINE (APRESOLINE) 25 MG tablet Take 0.5 tablets (12.5 mg total) by mouth 3 (three) times daily. 45 tablet 6  . Insulin Glargine (LANTUS SOLOSTAR) 100 UNIT/ML Solostar Pen Inject 20 Units into the  skin daily at 10 pm. 15 mL 11  . INSULIN SYRINGE .5CC/29G (B-D INS SYR ULTRAFINE .5CC/29G) 29G X 1/2" 0.5 ML MISC 1 each by Does not apply route 3 (three) times daily before meals. 200 each 2  . isosorbide mononitrate (IMDUR) 30 MG 24 hr tablet Take 1 tablet (30 mg total) by mouth daily. 30 tablet 6  . ivabradine (CORLANOR) 5 MG TABS tablet Take 1 tablet (5 mg total) by mouth 2 (two) times daily with a meal. 60 tablet 3  . metFORMIN (GLUCOPHAGE) 1000 MG tablet Take 1 tablet (1,000 mg total) by mouth 2 (two) times daily with a meal. 180 tablet 3  . Patiromer Sorbitex Calcium (VELTASSA)  16.8 g PACK Take by mouth.    . promethazine (PHENERGAN) 25 MG tablet Take 1 tablet (25 mg total) by mouth every 6 (six) hours as needed for nausea or vomiting. 30 tablet 0  . sacubitril-valsartan (ENTRESTO) 24-26 MG Take 1 tablet by mouth 2 (two) times daily. 60 tablet 3  . sitaGLIPtin (JANUVIA) 50 MG tablet Take 1 tablet (50 mg total) by mouth daily. 30 tablet 2  . spironolactone (ALDACTONE) 25 MG tablet Take 1 tablet (25 mg total) by mouth daily. 30 tablet 3   No current facility-administered medications for this encounter.    BP 107/82 (BP Location: Left Arm, Patient Position: Sitting, Cuff Size: Normal)   Pulse 82   Wt 207 lb (93.9 kg)   SpO2 100%   BMI 30.57 kg/m    Wt Readings from Last 3 Encounters:  05/30/16 207 lb (93.9 kg)  05/17/16 211 lb (95.7 kg)  05/09/16 213 lb 3.2 oz (96.7 kg)    General: Well appearing. NAD.   Neck: JVP 7-8 cm. No thyromegaly or thyroid nodule.  Lungs: Clear, normal effort CV: Nondisplaced PMI.  Heart rregular S1/S2, No S3/S4.   No carotid bruit.  Normal pedal pulses.  Abdomen: soft, NT, ND, no HSM. No bruits or masses. +BS  Skin: Intact without lesions or rashes.  Neurologic: Alert and oriented x 3.  Psych: Normal affect. Extremities: No clubbing or cyanosis. Trace ankle edema at most.  HEENT: Normal.   Assessment/Plan: 1. Chronic systolic CHF: Nonischemic cardiomyopathy, etiology uncertain.  Consider prior ETOH or HTN.   CMRI- EF 27%. RV normal. No evidence of infiltrative disease, myocarditis, or MI.  -Previously discussed CMRI, He understands that we will try to optimize HF meds then repeat ECHO. May need ICD if EF remains < 35%.  - NYHA III.  - Volume status improved with extra lasix and watching intake.  - Continue lasix 40 mg daily with extra 40 mg as needed for weight gain. BMET today.  - Continue coreg 6.25 mg BID  - Continue entresto 24-26 mg BID     - Continue spironolactone 25 mg daily.   - Increase hydralazine to 25 mg TID.  -  Continue imdur 30 mg daily.  - Continue digoxin 0.125 daily.  - CPX- mild HF limitation .   2. Type II diabetes: Needs PCP, have enlisted Education officer, [employer] to help.   3. HTN:  - Improved with med adjustment.  4. Deafness:  - Sign language interpreter present at visit.  5. Hyperkalemia - Continue veltassa 16.8. BMET today.   Improved from previous visit with med adjustment and increased compliance.  Meds and labs as above. Follow up 3-4 weeks with Pharm D for med titration.   Follow up 2 months from then with Dr. Aundra Dubin with Echo.  If  EF remains <35%, will need ICD.  Pt aware.   Shirley Friar, PA-C  05/30/2016  Total time spent > 25 minutes. Over half that spent discussing the above.

## 2016-06-06 ENCOUNTER — Telehealth: Payer: Self-pay

## 2016-06-06 NOTE — Telephone Encounter (Signed)
Patient needs a note that states any restrictions he would have if hired for a Job. Please advise if this can be done. It needs to be faxed to his Vocational Rehab worker. "Beth" at fax number [phone].Thansk!

## 2016-06-07 ENCOUNTER — Encounter (HOSPITAL_COMMUNITY): Payer: Self-pay

## 2016-06-07 ENCOUNTER — Encounter: Payer: Self-pay | Admitting: Family Medicine

## 2016-06-07 NOTE — Progress Notes (Signed)
Medical record request received from patient's vocational rehab through [employer] employer (works in [employer]). All records sent with letter from [facility] NP-C stating okay to resume work without restrictions send to provided fax # [phone] Copy of request scanned into patient's electronic medical record.  Renee Pain, RN

## 2016-06-20 ENCOUNTER — Ambulatory Visit: Payer: 59 | Admitting: Family Medicine

## 2016-06-20 ENCOUNTER — Other Ambulatory Visit (INDEPENDENT_AMBULATORY_CARE_PROVIDER_SITE_OTHER): Payer: BLUE CROSS/BLUE SHIELD

## 2016-06-20 ENCOUNTER — Other Ambulatory Visit: Payer: Self-pay | Admitting: Family Medicine

## 2016-06-20 DIAGNOSIS — E118 Type 2 diabetes mellitus with unspecified complications: Secondary | ICD-10-CM

## 2016-06-20 DIAGNOSIS — I5022 Chronic systolic (congestive) heart failure: Secondary | ICD-10-CM

## 2016-06-20 LAB — COMPLETE METABOLIC PANEL WITH GFR
ALT: 16 U/L (ref 9–46)
AST: 16 U/L (ref 10–40)
Albumin: 4.1 g/dL (ref 3.6–5.1)
Alkaline Phosphatase: 53 U/L (ref 40–115)
BILIRUBIN TOTAL: 0.4 mg/dL (ref 0.2–1.2)
BUN: 16 mg/dL (ref 7–25)
CO2: 27 mmol/L (ref 20–31)
Calcium: 9 mg/dL (ref 8.6–10.3)
Chloride: 98 mmol/L (ref 98–110)
Creat: 0.97 mg/dL (ref 0.60–1.35)
GFR, Est African American: >89 mL/min (ref >=60)
GLUCOSE: 324 mg/dL — AB (ref 65–99)
POTASSIUM: 4.1 mmol/L (ref 3.5–5.3)
Sodium: 134 mmol/L — ABNORMAL LOW (ref 135–146)
TOTAL PROTEIN: 7.6 g/dL (ref 6.1–8.1)

## 2016-06-21 ENCOUNTER — Telehealth: Payer: Self-pay

## 2016-06-21 LAB — HEMOGLOBIN A1C
Hgb A1c MFr Bld: 7.4 % — ABNORMAL HIGH (ref <5.7)
Mean Plasma Glucose: 166 mg/dL

## 2016-06-21 NOTE — Telephone Encounter (Signed)
-----   Message from Dorena Dew, [place] sent at 06/21/2016  8:40 AM EDT ----- Regarding: lab results Please inform Troy Rush that hemoglobin a1C has improved to 7.4, will continue current medication regimen.   Thanks ----- Message ----- From: Interface, Lab In [facility] Sent: 06/20/2016   8:00 PM To: Dorena Dew, FNP

## 2016-06-21 NOTE — Telephone Encounter (Signed)
Called and spoke with patient through interpreter services. Advised of Hgba1c now at 7.4 and this is a improvement from his previous levels. Advised to continue current medications and keep next scheduled appointment. Patient agreed. Thanks!

## 2016-06-27 ENCOUNTER — Ambulatory Visit (HOSPITAL_COMMUNITY)
Admission: RE | Admit: 2016-06-27 | Discharge: 2016-06-27 | Disposition: A | Discharge status: Home / Self Care | Payer: BLUE CROSS/BLUE SHIELD | Source: Ambulatory Visit | Attending: Cardiology | Admitting: Cardiology

## 2016-06-27 VITALS — BP 124/90 | HR 94 | Wt 213.0 lb

## 2016-06-27 DIAGNOSIS — E875 Hyperkalemia: Secondary | ICD-10-CM | POA: Insufficient documentation

## 2016-06-27 DIAGNOSIS — I110 Hypertensive heart disease with heart failure: Secondary | ICD-10-CM | POA: Insufficient documentation

## 2016-06-27 DIAGNOSIS — H9190 Unspecified hearing loss, unspecified ear: Secondary | ICD-10-CM | POA: Insufficient documentation

## 2016-06-27 DIAGNOSIS — E119 Type 2 diabetes mellitus without complications: Secondary | ICD-10-CM | POA: Diagnosis not present

## 2016-06-27 DIAGNOSIS — I5020 Unspecified systolic (congestive) heart failure: Secondary | ICD-10-CM | POA: Insufficient documentation

## 2016-06-27 DIAGNOSIS — I5022 Chronic systolic (congestive) heart failure: Secondary | ICD-10-CM | POA: Insufficient documentation

## 2016-06-27 LAB — DIGOXIN LEVEL

## 2016-06-27 LAB — MAGNESIUM: Magnesium: 1.8 mg/dL (ref 1.7–2.4)

## 2016-06-27 MED ORDER — DIGOXIN 125 MCG PO TABS: 0.1250 mg | ORAL_TABLET | Freq: Every day | ORAL | 3 refills | Status: DC

## 2016-06-27 MED ORDER — CARVEDILOL 12.5 MG PO TABS: 12.5000 mg | ORAL_TABLET | Freq: Two times a day (BID) | ORAL | 3 refills | Status: DC

## 2016-06-27 MED ORDER — SACUBITRIL-VALSARTAN 24-26 MG PO TABS: 1.0000 | ORAL_TABLET | Freq: Two times a day (BID) | ORAL | 3 refills | Status: DC

## 2016-06-27 MED ORDER — ATORVASTATIN CALCIUM 80 MG PO TABS: 80.0000 mg | ORAL_TABLET | Freq: Every day | ORAL | 3 refills | Status: DC

## 2016-06-27 MED ORDER — SPIRONOLACTONE 25 MG PO TABS: 25.0000 mg | ORAL_TABLET | Freq: Every day | ORAL | 3 refills | Status: DC

## 2016-06-27 MED ORDER — FUROSEMIDE 40 MG PO TABS
40.0000 mg | ORAL_TABLET | Freq: Every day | ORAL | 5 refills | Status: DC
Start: 2016-06-27 — End: 2016-09-19

## 2016-06-27 MED ORDER — IVABRADINE HCL 5 MG PO TABS: 5.0000 mg | ORAL_TABLET | Freq: Two times a day (BID) | ORAL | 3 refills | Status: DC

## 2016-06-27 MED ORDER — ISOSORBIDE MONONITRATE ER 30 MG PO TB24: 30.0000 mg | ORAL_TABLET | Freq: Every day | ORAL | 3 refills | Status: DC

## 2016-06-27 NOTE — Patient Instructions (Addendum)
It was great to see you today!   Please take an EXTRA furosemide 40 mg tablet today and tomorrow then resume 40 mg ONCE DAILY.   Please INCREASE your carvedilol to 12.5 mg TWICE DAILY. You may take 2 of your carvedilol 6.25 mg tablets twice daily until you run out.   Labs today. We will call you with any abnormalities.   Follow up in 1 month with NP/PA after your trip.   Please keep your appointment with Dr. Aundra Dubin on 08/29/16.

## 2016-06-27 NOTE — Progress Notes (Signed)
HF MD: [facility]  HPI:  Troy Rush is a 46 yo AA male with deafness, HTN, DM, and chronic systolic CHF.  Patient had no known cardiac problems until 10/16.  At that time, he developed exertional dyspnea and was admitted to a hospital in [place], [place] with acute systolic CHF.  Echo 10/16 showed EF 15-20%.  Cardiac cath showed no significant coronary disease.  He was diuresed and started on cardiac meds. Subsequently, he moved to [facility].  Now working in a [employer].    He returns for pharmacist-led HF medication titration. Sign language interpreter present. At last HF clinic visit on 05/30/16, his hydralazine was increased to 25 mg TID. Weight up 6 lbs since last visit. He is more SOB and complaining of coughing at night. No fevers. Can walk about 30 minutes to the bus stop daily without much SOB. No SOB with ADLs such as bathing, changing clothes, or getting around the house. Took all of his medication this am but admits to missing at least 1 day of his medications/week. Continues to work in the [employer] Denies ETOH use. Does not smoke. He is going to [place] next Monday x 1 month.   . Shortness of breath/dyspnea on exertion? yes  . Orthopnea/PND? Yes - sleeps on 2 pillows (stable) . Edema? no . Lightheadedness/dizziness? no . Daily weights at home? No  . Blood pressure/heart rate monitoring at home? Yes - automatic wrist cuff but doesn't use often . Following low-sodium/fluid-restricted diet? Yes - no added salt   HF Medications: Carvedilol 6.25 mg PO BID Digoxin 0.125 mg PO daily Furosemide 40 mg PO daily  Hydralazine 25 mg PO TID Isosorbide mononitrate 30 mg PO daily Corlanor 5 mg PO BID Veltassa 16.8 gm PO daily  Entresto 24-26 mg PO BID Spironolactone 25 mg PO daily      Has the patient been experiencing any side effects to the medications prescribed?  no  Does the patient have any problems obtaining medications due to transportation or finances?   No - [employer] commercial    Understanding of regimen: fair Understanding of indications: fair Potential of compliance: fair Patient understands to avoid NSAIDs. Patient understands to avoid decongestants.    Pertinent Lab Values: . 06/27/16: Magnesium 1.8, Digoxin <0.2  . 06/20/16: Serum creatinine 0.97, BUN 16, Potassium 4.1, Sodium 134 . 05/30/16: BNP 46   Vital Signs: . Weight: 213 lb (dry weight: 207 lb) . Blood pressure: 124/90 mmHg  . Heart rate: 94 bpm        ReDS Vest - 06/27/16 1500      ReDS Vest   MR  No   Estimated volume prior to reading Med   Fitting Posture Standing   Height Marker Tall   Ruler Value 9   Center Strip Shifted   ReDS Value 35       Assessment: 1. Chronicsystolic CHF (EF 10% CMRI), due to NICM (etiology uncertain). NYHA class IIIsymptoms.  - Volume status elevated slightly based on weight gain and SOB, vest value borderline high  - Take additional 40 mg of furosemide x 2 days then continue 40 mg daily with additional 40 mg if weight gain > 3 lb in 1 day or > 5 lb in 1 week   - Increase carvedilol to 12.5 mg BID  - Continue digoxin 0.125 mg daily, hydralazine 25 mg TID, isosorbide mononitrate 30 mg daily, Corlanor 5 mg BID, Veltassa 16.8 gm daily, Entresto 24-26 mg BID and spironolactone 25 mg daily   -  Consider increase Entresto at next visit (did not increase today 2/2 to not being able to get BMET f/u since he is going on a 1 mo vacation next week)  - Basic disease state pathophysiology, medication indication, mechanism and side effects reviewed at length with patient and he verbalized understanding 2. Type II diabetes  - Now has PCP 3. HTN  - Improved with med adjustment.  5. Hyperkalemia  - Continue veltassa 16.8. BMET today 4. Deafness  - Sign language interpreter present at visit.   Plan: 1) Medication changes: Based on clinical presentation, vital signs and recent labs will double furosemide x 2 days and increase carvedilol to 12.5 mg BID 2) Labs:  Digoxin and Mag level today  3) Follow-up: NP/PA on 08/03/16    Layne Lebon K. Velva Harman, PharmD, BCPS, CPP Clinical Pharmacist Pager: [phone] Phone: [phone] 06/27/2016 1:05 PM

## 2016-06-29 ENCOUNTER — Ambulatory Visit (INDEPENDENT_AMBULATORY_CARE_PROVIDER_SITE_OTHER): Payer: BLUE CROSS/BLUE SHIELD | Admitting: Family Medicine

## 2016-06-29 ENCOUNTER — Encounter: Payer: Self-pay | Admitting: Family Medicine

## 2016-06-29 VITALS — BP 133/84 | HR 106 | Temp 98.3°F | Resp 14 | Ht 69.0 in | Wt 213.0 lb

## 2016-06-29 DIAGNOSIS — Z794 Long term (current) use of insulin: Secondary | ICD-10-CM | POA: Diagnosis not present

## 2016-06-29 DIAGNOSIS — Z23 Encounter for immunization: Secondary | ICD-10-CM | POA: Diagnosis not present

## 2016-06-29 DIAGNOSIS — E118 Type 2 diabetes mellitus with unspecified complications: Secondary | ICD-10-CM

## 2016-06-29 DIAGNOSIS — H9193 Unspecified hearing loss, bilateral: Secondary | ICD-10-CM

## 2016-06-29 LAB — POCT URINALYSIS DIP (DEVICE)
Bilirubin Urine: NEGATIVE
Glucose, UA: NEGATIVE mg/dL
Hgb urine dipstick: NEGATIVE
Ketones, ur: NEGATIVE mg/dL
Leukocytes, UA: NEGATIVE
Nitrite: NEGATIVE
Protein, ur: 100 mg/dL — AB
Specific Gravity, Urine: 1.015 (ref 1.005–1.030)
Urobilinogen, UA: 0.2 mg/dL (ref 0.0–1.0)
pH: 5 (ref 5.0–8.0)

## 2016-06-29 LAB — GLUCOSE, CAPILLARY: Glucose-Capillary: 142 mg/dL — ABNORMAL HIGH (ref 65–99)

## 2016-06-29 NOTE — Patient Instructions (Addendum)
Diabetes Mellitus and Exercise Exercising regularly is important for your overall health, especially when you have diabetes (diabetes mellitus). Exercising is not only about losing weight. It has many health benefits, such as increasing muscle strength and bone density and reducing body fat and stress. This leads to improved fitness, flexibility, and endurance, all of which result in better overall health. Exercise has additional benefits for people with diabetes, including:  Reducing appetite.  Helping to lower and control blood glucose.  Lowering blood pressure.  Helping to control amounts of fatty substances (lipids) in the blood, such as cholesterol and triglycerides.  Helping the body to respond better to insulin (improving insulin sensitivity).  Reducing how much insulin the body needs.  Decreasing the risk for heart disease by:  Lowering cholesterol and triglyceride levels.  Increasing the levels of good cholesterol.  Lowering blood glucose levels. What is my activity plan? Your health care provider or certified diabetes educator can help you make a plan for the type and frequency of exercise (activity plan) that works for you. Make sure that you:  Do at least 150 minutes of moderate-intensity or vigorous-intensity exercise each week. This could be brisk walking, biking, or water aerobics.  Do stretching and strength exercises, such as yoga or weightlifting, at least 2 times a week.  Spread out your activity over at least 3 days of the week.  Get some form of physical activity every day.  Do not go more than 2 days in a row without some kind of physical activity.  Avoid being inactive for more than 90 minutes at a time. Take frequent breaks to walk or stretch.  Choose a type of exercise or activity that you enjoy, and set realistic goals.  Start slowly, and gradually increase the intensity of your exercise over time. What do I need to know about managing my  diabetes?  Check your blood glucose before and after exercising.  If your blood glucose is higher than 240 mg/dL (13.3 mmol/L) before you exercise, check your urine for ketones. If you have ketones in your urine, do not exercise until your blood glucose returns to normal.  Know the symptoms of low blood glucose (hypoglycemia) and how to treat it. Your risk for hypoglycemia increases during and after exercise. Common symptoms of hypoglycemia can include:  Hunger.  Anxiety.  Sweating and feeling clammy.  Confusion.  Dizziness or feeling light-headed.  Increased heart rate or palpitations.  Blurry vision.  Tingling or numbness around the mouth, lips, or tongue.  Tremors or shakes.  Irritability.  Keep a rapid-acting carbohydrate snack available before, during, and after exercise to help prevent or treat hypoglycemia.  Avoid injecting insulin into areas of the body that are going to be exercised. For example, avoid injecting insulin into:  The arms, when playing tennis.  The legs, when jogging.  Keep records of your exercise habits. Doing this can help you and your health care provider adjust your diabetes management plan as needed. Write down:  Food that you eat before and after you exercise.  Blood glucose levels before and after you exercise.  The type and amount of exercise you have done.  When your insulin is expected to peak, if you use insulin. Avoid exercising at times when your insulin is peaking.  When you start a new exercise or activity, work with your health care provider to make sure the activity is safe for you, and to adjust your insulin, medicines, or food intake as needed.  Drink plenty   of water while you exercise to prevent dehydration or heat stroke. Drink enough fluid to keep your urine clear or pale yellow. This information is not intended to replace advice given to you by your health care provider. Make sure you discuss any questions you have with  your health care provider. Document Released: 06/18/2003 Document Revised: 10/16/2015 Document Reviewed: 09/07/2015 Elsevier Interactive Patient Education  2017 [place]. Diabetes Mellitus and Food It is important for you to manage your blood sugar (glucose) level. Your blood glucose level can be greatly affected by what you eat. Eating healthier foods in the appropriate amounts throughout the day at about the same time each day will help you control your blood glucose level. It can also help slow or prevent worsening of your diabetes mellitus. Healthy eating may even help you improve the level of your blood pressure and reach or maintain a healthy weight. General recommendations for healthful eating and cooking habits include:  Eating meals and snacks regularly. Avoid going long periods of time without eating to lose weight.  Eating a diet that consists mainly of plant-based foods, such as fruits, vegetables, nuts, legumes, and whole grains.  Using low-heat cooking methods, such as baking, instead of high-heat cooking methods, such as deep frying. Work with your dietitian to make sure you understand how to use the Nutrition Facts information on food labels. How can food affect me? Carbohydrates  Carbohydrates affect your blood glucose level more than any other type of food. Your dietitian will help you determine how many carbohydrates to eat at each meal and teach you how to count carbohydrates. Counting carbohydrates is important to keep your blood glucose at a healthy level, especially if you are using insulin or taking certain medicines for diabetes mellitus. Alcohol  Alcohol can cause sudden decreases in blood glucose (hypoglycemia), especially if you use insulin or take certain medicines for diabetes mellitus. Hypoglycemia can be a life-threatening condition. Symptoms of hypoglycemia (sleepiness, dizziness, and disorientation) are similar to symptoms of having too much alcohol. If your  health care provider has given you approval to drink alcohol, do so in moderation and use the following guidelines:  Women should not have more than one drink per day, and men should not have more than two drinks per day. One drink is equal to:  12 oz of beer.  5 oz of wine.  1 oz of hard liquor.  Do not drink on an empty stomach.  Keep yourself hydrated. Have water, diet soda, or unsweetened iced tea.  Regular soda, juice, and other mixers might contain a lot of carbohydrates and should be counted. What foods are not recommended? As you make food choices, it is important to remember that all foods are not the same. Some foods have fewer nutrients per serving than other foods, even though they might have the same number of calories or carbohydrates. It is difficult to get your body what it needs when you eat foods with fewer nutrients. Examples of foods that you should avoid that are high in calories and carbohydrates but low in nutrients include:  Trans fats (most processed foods list trans fats on the Nutrition Facts label).  Regular soda.  Juice.  Candy.  Sweets, such as cake, pie, doughnuts, and cookies.  Fried foods. What foods can I eat? Eat nutrient-rich foods, which will nourish your body and keep you healthy. The food you should eat also will depend on several factors, including:  The calories you need.  The medicines you  take.  Your weight.  Your blood glucose level.  Your blood pressure level.  Your cholesterol level. You should eat a variety of foods, including:  Protein.  Lean cuts of meat.  Proteins low in saturated fats, such as fish, egg whites, and beans. Avoid processed meats.  Fruits and vegetables.  Fruits and vegetables that may help control blood glucose levels, such as apples, mangoes, and yams.  Dairy products.  Choose fat-free or low-fat dairy products, such as milk, yogurt, and cheese.  Grains, bread, pasta, and rice.  Choose  whole grain products, such as multigrain bread, whole oats, and brown rice. These foods may help control blood pressure.  Fats.  Foods containing healthful fats, such as nuts, avocado, olive oil, canola oil, and fish. Does everyone with diabetes mellitus have the same meal plan? Because every person with diabetes mellitus is different, there is not one meal plan that works for everyone. It is very important that you meet with a dietitian who will help you create a meal plan that is just right for you. This information is not intended to replace advice given to you by your health care provider. Make sure you discuss any questions you have with your health care provider. Document Released: 12/23/2004 Document Revised: 09/03/2015 Document Reviewed: 02/22/2013 Elsevier Interactive Patient Education  2017 [place].  Diabetes Mellitus and Skin Care Diabetes (diabetes mellitus) can lead to health problems over time, including skin problems. People with diabetes have a higher risk for many types of skin complications. This is because having poorly controlled blood sugar (glucose) levels can:  Damage nerves and blood vessels. This can result in decreased feeling in your legs and feet, which means you may not notice minor skin injuries that could lead to serious problems.  Reduce blood flow (circulation), which makes wounds heal more slowly and increases your risk of infection.  Cause areas of skin to become thick or discolored. What are some common skin conditions that affect people with diabetes? Diabetes often causes dry skin. It can also cause the skin on the feet to get thinner, break more easily, and heal more slowly. There are certain skin conditions that commonly affect people who have diabetes, such as:  Bacterial skin infections, such as styes, boils, infected hair follicles, and infections of the skin around the nails.  Fungal skin infections. These are most common in areas where skin  rubs together, such as in the armpits or under the breasts.  Open sores, especially on the feet.  Tissue death (gangrene). This can happen on your feet if a serious infection does not heal properly. Gangrene can cause the need for a foot or leg to be surgically removed (amputated). Diabetes can also cause the skin to change. You may develop:  Dark, velvety markings on the skin that usually appear on the face, neck, armpits, inner thighs, and groin (acanthosis nigricans). This typically affects people of African-American and American-Indian descent.  Red, raised, scar-like tissue that may itch, feel painful, or develop into a wound (necrobiosis lipoidica).  Blisters on feet, toes, hands, or fingers.  Thickened, wax-like areas of skin that usually occur on the hands, forehead, or toes (digital sclerosis).  Brown or red ring-shaped or half-ring-shaped patches of skin on the ears or fingers (disseminated granuloma).  Pea-shaped yellow bumps that may be itchy and surrounded by a red ring (eruptive xanthomatosis). This usually affects the arms, feet, buttocks, and the top of the hands.  Round, discolored patches of tan skin that  do not hurt or itch (diabetic dermopathy). These may look like age spots. What do I need to know about itchy skin? It is common for people with diabetes to have itchy skin caused by dryness. Frequent high blood glucose levels can cause itchiness, and poor circulation and certain skin infections can make dry, itchy skin worse. If you have itchy skin that is red or covered in a rash, this could be a sign of an allergic reaction to a medicine. If you have a rash or if your skin is very itchy, contact your health care provider. You may need help to manage your diabetes better, or you may need treatment for an infection. How can I prevent skin breakdown? When you have diabetes and you get a badly infected ulcer or sore that does not heal, your skin can break down, especially if  you have poor circulation or are on bed rest. To prevent skin breakdown:  Keep your skin clean and dry. Wash your skin often. Do not use hot water.  Do not use any products that contain nicotine or tobacco, such as cigarettes and e-cigarettes. Smoking affects the body's ability to heal. If you need help quitting, ask your health care provider.  Check your skin every day for cuts, bruises, redness, blisters, or sores, especially on your feet. Tell your health care provider about any cuts, wounds, or sores you have, especially if they are healing slowly.  If you are on bed rest, try to change positions often. What else do I need to know about taking care of my skin?   To relieve dry skin and itching:  Limit baths and showers to 5-10 minutes.  Bathe with lukewarm water instead of hot water.  Use mild soap and gentle skin cleansers. Do not use soap that is perfumed or harsh or dries your skin.  Put on lotion as soon as you finish bathing.  Make sure that your health care provider performs a visual foot exam at every medical visit.  Schedule a foot exam with your health care provider once every year. This exam includes an inspection of the structure and skin of your feet.  If you get a skin injury, such as a cut, blister, or sore, check the area every day for signs of infection. Check for:  More redness, swelling, or pain.  More fluid or blood.  Warmth.  Pus or a bad smell. Contact a health care provider if:  You develop a cut or sore, especially on your feet.  You develop signs of infection after a skin injury.  Your blood glucose level is higher than 240 mg/dL (13.3 mmol/L) for 2 days in a row.  You have itchy skin that develops redness or a rash.  You have discolored areas of skin.  You have areas where your skin is changing, such as thickening or appearing shiny. This information is not intended to replace advice given to you by your health care provider. Make sure you  discuss any questions you have with your health care provider. Document Released: 09/08/2015 Document Revised: 10/16/2015 Document Reviewed: 09/08/2015 Elsevier Interactive Patient Education  2017 [employer].

## 2016-06-29 NOTE — Progress Notes (Signed)
Subjective:    Patient ID: Troy Rush, male    DOB: October 07, 1970, 46 y.o.   MRN: [id number] Troy Rush, a 46 year old hearing impaired male presents accompanied by an interpreter for a follow up of diabetes. Troy Rush had a hemoglobin a1C 9 days ago, which had decreased to 7.4. Previously, hemoglobin a1C was greater than 14.  Diabetes  He presents for his follow-up diabetic visit. He has type 2 diabetes mellitus. No MedicAlert identification noted. His disease course has been worsening. Pertinent negatives for hypoglycemia include no confusion, dizziness, headaches, hunger, mood changes, nervousness/anxiousness, pallor, seizures, sleepiness, speech difficulty, sweats or tremors. Pertinent negatives for diabetes include no blurred vision, no chest pain, no fatigue, no foot paresthesias, no foot ulcerations, no polydipsia, no polyphagia, no polyuria, no visual change, no weakness and no weight loss. Pertinent negatives for hypoglycemia complications include no hospitalization. Pertinent negatives for diabetic complications include no CVA or heart disease. Risk factors for coronary artery disease include diabetes mellitus and hypertension. Current diabetic treatment includes diet and oral agent (monotherapy). He is compliant with treatment most of the time. His weight is stable. He is following a high fat/cholesterol diet. When asked about meal planning, he reported none. He has not had a previous visit with a dietitian. He rarely participates in exercise. An ACE inhibitor/angiotensin II receptor blocker is not being taken. He does not see a podiatrist.Eye exam is not current.   Past Medical History:  Diagnosis Date  . Bronchitis   . CHF (congestive heart failure) ([place])   . Diabetes mellitus without complication ([place])   . Dyslipidemia   . Hypertension   . Pancreatitis    Social History   Social History  . Marital status: Legally Separated    Spouse name: N/A  . Number of children: N/A  . Years  of education: N/A   Occupational History  . Not on file.   Social History Main Topics  . Smoking status: Never Smoker  . Smokeless tobacco: Never Used  . Alcohol use Yes     Comment: rare  . Drug use: No  . Sexual activity: Not on file   Other Topics Concern  . Not on file   Social History Narrative  . No narrative on file   Immunization History  Administered Date(s) Administered  . Influenza,inj,Quad PF,36+ Mos 03/21/2016  . Pneumococcal Polysaccharide-23 06/29/2016  . Tdap 06/29/2016   Review of Systems  Constitutional: Negative.  Negative for fatigue and weight loss.  HENT: Negative.   Eyes: Negative.  Negative for blurred vision.  Respiratory: Negative.   Cardiovascular: Negative.  Negative for chest pain.  Gastrointestinal: Negative.   Endocrine: Negative for polydipsia, polyphagia and polyuria.  Musculoskeletal: Negative.   Skin: Negative.  Negative for pallor.  Neurological: Positive for numbness. Negative for dizziness, tremors, seizures, speech difficulty, weakness and headaches.  Hematological: Negative.   Psychiatric/Behavioral: Negative.  Negative for confusion. The patient is not nervous/anxious.        Objective:   Physical Exam  Constitutional: He is oriented to person, place, and time. He appears well-developed and well-nourished.  HENT:  Head: Normocephalic and atraumatic.  Right Ear: External ear normal.  Left Ear: External ear normal.  Mouth/Throat: Oropharynx is clear and moist.  Eyes: Conjunctivae and EOM are normal. Pupils are equal, round, and reactive to light.  Neck: Normal range of motion. Neck supple.  Pulmonary/Chest: Effort normal and breath sounds normal.  Abdominal: Soft. Bowel sounds are normal.  Neurological: He  is alert and oriented to person, place, and time. He has normal reflexes.  Skin: Skin is warm and dry.  Psychiatric: He has a normal mood and affect. His behavior is normal. Judgment and thought content normal.      BP  133/84 (BP Location: Left Arm, Patient Position: Sitting, Cuff Size: Normal)   Pulse (!) 106   Temp 98.3 F (36.8 C) (Oral)   Resp 14   Ht 5\' 9"  (1.753 m)   Wt 213 lb (96.6 kg)   SpO2 100%   BMI 31.45 kg/m  Assessment & Plan:   1. Type 2 diabetes mellitus with complication, unspecified long term insulin use status (HCC) Reviewed hemoglobin a1C, has decreased from greater than 14 to 7.4. Patient has been consistent with taking medications and diet. Counseled on diet and medication regimen at length. Will follow up in office in 3 months. Blood sugar is 149 post prandially. He has been checking blood sugar continuously..  Reviewed BMP, GFR is > 60, will continue Metformin.    Continue to check blood sugars prior to meals and at bed time. Bring glucometer to follow up appointment. Reviewed CBG.  - Glucose (CBG) Diabetic Foot Exam - Simple   Simple Foot Form Diabetic Foot exam was performed with the following findings:  Yes 06/29/2016 12:29 PM  Visual Inspection No deformities, no ulcerations, no other skin breakdown bilaterally:  Yes Sensation Testing Intact to touch and monofilament testing bilaterally:  Yes Pulse Check Posterior Tibialis and Dorsalis pulse intact bilaterally:  Yes Comments    2. Bilateral deafness Patient speaks sign language, was accompanied by interpreter  3. Need for Tdap vaccination  - Tdap vaccine greater than or equal to 7yo IM  4. Immunization due - Pneumococcal polysaccharide vaccine 23-valent greater than or equal to 2yo subcutaneous/IM   RTC: Follow up in clinic in 3 months for DMII   Troy Pounds  MSN, FNP-C [facility] [place], [place] [address] [phone]  The patient was given clear instructions to go to ER or return to medical center if symptoms do not improve, worsen or new problems develop. The patient verbalized understanding. Will notify patient with laboratory  results.

## 2016-07-05 ENCOUNTER — Telehealth (HOSPITAL_COMMUNITY): Payer: Self-pay

## 2016-07-05 NOTE — Telephone Encounter (Signed)
Corlanor PA approved by [facility] (ID: [id number]).   Belia Heman, PharmD PGY1 Pharmacy Resident [phone] (Pager) 07/05/2016 10:14 AM

## 2016-08-03 ENCOUNTER — Inpatient Hospital Stay (HOSPITAL_COMMUNITY): Admission: RE | Admit: 2016-08-03 | Payer: BLUE CROSS/BLUE SHIELD | Source: Ambulatory Visit

## 2016-08-15 ENCOUNTER — Ambulatory Visit: Payer: BLUE CROSS/BLUE SHIELD | Admitting: Dietician

## 2016-08-25 ENCOUNTER — Ambulatory Visit: Payer: Medicare Other | Admitting: Dietician

## 2016-08-29 ENCOUNTER — Ambulatory Visit (HOSPITAL_BASED_OUTPATIENT_CLINIC_OR_DEPARTMENT_OTHER)
Admission: RE | Admit: 2016-08-29 | Discharge: 2016-08-29 | Disposition: A | Discharge status: Home / Self Care | Payer: Medicare Other | Source: Ambulatory Visit | Attending: Cardiology | Admitting: Cardiology

## 2016-08-29 ENCOUNTER — Encounter (HOSPITAL_COMMUNITY): Payer: Self-pay

## 2016-08-29 ENCOUNTER — Ambulatory Visit (HOSPITAL_COMMUNITY)
Admission: RE | Admit: 2016-08-29 | Discharge: 2016-08-29 | Disposition: A | Discharge status: Home / Self Care | Payer: Medicare Other | Source: Ambulatory Visit | Attending: Family Medicine | Admitting: Family Medicine

## 2016-08-29 ENCOUNTER — Telehealth (HOSPITAL_COMMUNITY): Payer: Self-pay | Admitting: Vascular Surgery

## 2016-08-29 VITALS — BP 152/104 | HR 95 | Ht 69.0 in | Wt 211.8 lb

## 2016-08-29 DIAGNOSIS — H9190 Unspecified hearing loss, unspecified ear: Secondary | ICD-10-CM | POA: Insufficient documentation

## 2016-08-29 DIAGNOSIS — E119 Type 2 diabetes mellitus without complications: Secondary | ICD-10-CM | POA: Diagnosis not present

## 2016-08-29 DIAGNOSIS — E785 Hyperlipidemia, unspecified: Secondary | ICD-10-CM | POA: Insufficient documentation

## 2016-08-29 DIAGNOSIS — I110 Hypertensive heart disease with heart failure: Secondary | ICD-10-CM | POA: Insufficient documentation

## 2016-08-29 DIAGNOSIS — I5022 Chronic systolic (congestive) heart failure: Secondary | ICD-10-CM | POA: Insufficient documentation

## 2016-08-29 DIAGNOSIS — F329 Major depressive disorder, single episode, unspecified: Secondary | ICD-10-CM | POA: Insufficient documentation

## 2016-08-29 DIAGNOSIS — I340 Nonrheumatic mitral (valve) insufficiency: Secondary | ICD-10-CM | POA: Insufficient documentation

## 2016-08-29 DIAGNOSIS — E875 Hyperkalemia: Secondary | ICD-10-CM | POA: Diagnosis not present

## 2016-08-29 LAB — BASIC METABOLIC PANEL
Anion gap: 9 (ref 5–15)
BUN: 12 mg/dL (ref 6–20)
CALCIUM: 9.4 mg/dL (ref 8.9–10.3)
CO2: 27 mmol/L (ref 22–32)
CREATININE: 0.87 mg/dL (ref 0.61–1.24)
Chloride: 98 mmol/L — ABNORMAL LOW (ref 101–111)
GFR calc non Af Amer: >60 mL/min (ref >60)
Glucose, Bld: 243 mg/dL — ABNORMAL HIGH (ref 65–99)
Potassium: 4 mmol/L (ref 3.5–5.1)
Sodium: 134 mmol/L — ABNORMAL LOW (ref 135–145)

## 2016-08-29 LAB — DIGOXIN LEVEL: Digoxin Level: <0.2 ng/mL — ABNORMAL LOW (ref 0.8–2.0)

## 2016-08-29 MED ORDER — SACUBITRIL-VALSARTAN 49-51 MG PO TABS: 1.0000 | ORAL_TABLET | Freq: Two times a day (BID) | ORAL | 3 refills | Status: DC

## 2016-08-29 NOTE — Patient Instructions (Signed)
INCREASE Entresto to 49/51mg  twice daily.  Repeat labs in 10 days (BMET)   You have been referred to EP for ICD.   ------------------------------------------------   ------------------------------------------------  Follow up with Dr.McLean in 6 weeks

## 2016-08-29 NOTE — Telephone Encounter (Signed)
Sent Lorenda Hatchet message to call pt to schedule w/ EP

## 2016-08-30 NOTE — Progress Notes (Signed)
Advanced Heart Failure Clinic Note   PCP: Cammie Sickle Cardiology: Dr. Aundra Dubin  46 yo with deafness, HTN, DM, and chronic systolic CHF presents for cardiology followup.  Patient had no known cardiac problems until 10/16.  At that time, he developed exertional dyspnea and was admitted to a hospital in [place], [place] with acute systolic CHF.  Echo 10/16 showed EF 15-20%.  Cardiac cath showed no significant coronary disease.  He was diuresed and started on cardiac meds. Subsequently, he moved to [facility].  Now working in a [employer].    He returns for regular follow up. Sign language interpreter present. BP is high today.  He says he took all his meds but sounds a bit unsure about this.  No exertional dyspnea, able to do his job without problems.  No cough, no orthopnea/PN, no lightheadedness. Weight is up 4 lbs.    Echo was done today and reviewed, EF remains low in the 25-30% range.   Labs (9/17): BNP 477, K 4.5 => 4.2, creatinine 0.92 => 1.1, BNP 1009, HIV negative, SPEP negative.  Labs (10/17): K 5.2, creatinine 1.10, hgb 12.5 Labs (03/28/2016): K 5.3 Creatinine 1.07  Labs (3/18): digoxin < 0.2, K 4.1, creatinine 0.97  PMH: 1. HTN 2. Diabetes 3. Hyperlipidemia 4. Deafness since age 2: Needs sign language interpreter.  5. Chronic systolic CHF: Nonischemic cardiomyopathy.  Diagnosis 10/16 in [place], [place] (admitted with acute systolic CHF).  SPEP and HIV negative.  - Echo (10/16): EF 15-20%, mild LVH, mild MR, normal RV size and systolic function.  - LHC (10/16): No significant CAD.  - CPX (12/17): Mild functional limitation.  - 04/2016 CMRI: EF 27%. RV normal. No evidence of infiltrative disease, myocarditis, or MI.  - Echo (5/18): EF 25-30%, mild LV dilation, mild LVH, normal RV size with mildly decreased 6. Depression. 7. Colitis episodes.   SH: From [place] originally.  Lives in [place] until 2017, then moved to [place].  Nonsmoker.  Used to drink 3-4 ETOH beverages/day until  10/16, now stopped. Works in [employer].   FH: No known cardiac disease.   ROS: All systems reviewed and negative except as per HPI.   Current Outpatient Prescriptions  Medication Sig Dispense Refill  . aspirin 81 MG chewable tablet Chew 81 mg by mouth daily.    Marland Kitchen atorvastatin (LIPITOR) 80 MG tablet Take 1 tablet (80 mg total) by mouth daily. 90 tablet 3  . blood glucose meter kit and supplies KIT Dispense based on patient and insurance preference. Use up to four times daily as directed. (FOR ICD-9 250.00, 250.01). 1 each 0  . carvedilol (COREG) 12.5 MG tablet Take 1 tablet (12.5 mg total) by mouth 2 (two) times daily. 180 tablet 3  . citalopram (CELEXA) 40 MG tablet Take 1 tablet (40 mg total) by mouth daily. 90 tablet 1  . digoxin (LANOXIN) 0.125 MG tablet Take 1 tablet (0.125 mg total) by mouth daily. 90 tablet 3  . furosemide (LASIX) 40 MG tablet Take 1 tablet (40 mg total) by mouth daily. Take an additional 40 mg (1 tablet) for weight gain >3 lb in 1 day or >5 lb in 1 week. 180 tablet 5  . Insulin Glargine (LANTUS SOLOSTAR) 100 UNIT/ML Solostar Pen Inject 20 Units into the skin daily at 10 pm. 15 mL 11  . INSULIN SYRINGE .5CC/29G (B-D INS SYR ULTRAFINE .5CC/29G) 29G X 1/2" 0.5 ML MISC 1 each by Does not apply route 3 (three) times daily before meals. 200 each 2  .  isosorbide mononitrate (IMDUR) 30 MG 24 hr tablet Take 1 tablet (30 mg total) by mouth daily. 90 tablet 3  . ivabradine (CORLANOR) 5 MG TABS tablet Take 1 tablet (5 mg total) by mouth 2 (two) times daily with a meal. 180 tablet 3  . metFORMIN (GLUCOPHAGE) 1000 MG tablet Take 1 tablet (1,000 mg total) by mouth 2 (two) times daily with a meal. 180 tablet 3  . Patiromer Sorbitex Calcium (VELTASSA) 16.8 g PACK Take 16.8 g by mouth daily.     . promethazine (PHENERGAN) 25 MG tablet Take 1 tablet (25 mg total) by mouth every 6 (six) hours as needed for nausea or vomiting. 30 tablet 0  . sitaGLIPtin (JANUVIA) 50 MG tablet Take 1 tablet  (50 mg total) by mouth daily. 30 tablet 2  . spironolactone (ALDACTONE) 25 MG tablet Take 1 tablet (25 mg total) by mouth daily. 90 tablet 3  . hydrALAZINE (APRESOLINE) 25 MG tablet Take 1 tablet (25 mg total) by mouth 3 (three) times daily. 270 tablet 3  . sacubitril-valsartan (ENTRESTO) 49-51 MG Take 1 tablet by mouth 2 (two) times daily. 60 tablet 3   No current facility-administered medications for this encounter.    BP (!) 152/104   Pulse 95   Ht '5\' 9"'  (1.753 m)   Wt 211 lb 12 oz (96 kg)   SpO2 100%   BMI 31.27 kg/m    Wt Readings from Last 3 Encounters:  08/29/16 211 lb 12 oz (96 kg)  06/29/16 213 lb (96.6 kg)  06/27/16 213 lb (96.6 kg)    General: NAD  Neck: JVP 7 cm. No thyromegaly or thyroid nodule.  Lungs: Clear, normal effort CV: Nondisplaced PMI.  Heart regular S1/S2, No S3/S4.   No carotid bruit.  Normal pedal pulses.  Abdomen: soft, NT, ND, no HSM. No bruits or masses. +BS  Skin: Intact without lesions or rashes.  Neurologic: Alert and oriented x 3.  Psych: Normal affect. Extremities: No clubbing or cyanosis. No edema.  HEENT: Normal.   Assessment/Plan: 1. Chronic systolic CHF: Nonischemic cardiomyopathy, etiology uncertain.  Consider prior ETOH or HTN.   CMRI in 1/18 with EF 27%, RV normal, no evidence of infiltrative disease, myocarditis, or MI.  Echo was done today and reviewed, EF remains 25-30%.  He is not volume overloaded on exam, NYHA class II.  - Continue lasix 40 mg daily with extra 40 mg as needed for weight gain. BMET today.  - Continue coreg 12.5 mg BID - Increase Entresto to 49/51 bid, BMET in 10 days.     - Continue spironolactone 25 mg daily.   - Continue hydralazine/nitrates and Corlanor at current doses. - Continue digoxin 0.125 daily, check digoxin level today.  - Refer to EP for ICD.  Not CRT candidate with narrow QRS.  Though he has a nonischemic cardiomyopathy, he is young with persistently low EF and I think he could benefit from ICD.  Would  like Systems analyst.  2. Type II diabetes: No has PCP.  3. HTN: BP high, increasing Entresto as below.   4. Deafness: Sign language interpreter present at visit.  5. Hyperkalemia: Resolved.  Continue veltassa 16.8. BMET today.   Followup in 6 wks.  Will have pharmacist review meds with him.    Loralie Champagne, MD  08/30/2016

## 2016-09-01 ENCOUNTER — Encounter: Payer: Self-pay | Admitting: Internal Medicine

## 2016-09-02 ENCOUNTER — Institutional Professional Consult (permissible substitution): Payer: Medicare Other | Admitting: Internal Medicine

## 2016-09-07 ENCOUNTER — Encounter (HOSPITAL_COMMUNITY): Payer: Self-pay | Admitting: *Deleted

## 2016-09-08 ENCOUNTER — Ambulatory Visit (HOSPITAL_COMMUNITY)
Admission: RE | Admit: 2016-09-08 | Discharge: 2016-09-08 | Disposition: A | Discharge status: Home / Self Care | Payer: Medicare Other | Source: Ambulatory Visit | Attending: Internal Medicine | Admitting: Internal Medicine

## 2016-09-08 DIAGNOSIS — I5022 Chronic systolic (congestive) heart failure: Secondary | ICD-10-CM | POA: Diagnosis not present

## 2016-09-08 LAB — BASIC METABOLIC PANEL
ANION GAP: 8 (ref 5–15)
BUN: 36 mg/dL — AB (ref 6–20)
CALCIUM: 9.4 mg/dL (ref 8.9–10.3)
CO2: 26 mmol/L (ref 22–32)
CREATININE: 2.01 mg/dL — AB (ref 0.61–1.24)
Chloride: 98 mmol/L — ABNORMAL LOW (ref 101–111)
GFR calc Af Amer: 44 mL/min — ABNORMAL LOW (ref >60)
GFR, EST NON AFRICAN AMERICAN: 38 mL/min — AB (ref >60)
GLUCOSE: 163 mg/dL — AB (ref 65–99)
Potassium: 4.5 mmol/L (ref 3.5–5.1)
Sodium: 132 mmol/L — ABNORMAL LOW (ref 135–145)

## 2016-09-09 ENCOUNTER — Encounter: Payer: Medicare Other | Attending: Family Medicine | Admitting: Dietician

## 2016-09-09 DIAGNOSIS — Z794 Long term (current) use of insulin: Secondary | ICD-10-CM

## 2016-09-09 DIAGNOSIS — E118 Type 2 diabetes mellitus with unspecified complications: Secondary | ICD-10-CM | POA: Diagnosis not present

## 2016-09-09 DIAGNOSIS — Z6831 Body mass index (BMI) 31.0-31.9, adult: Secondary | ICD-10-CM | POA: Insufficient documentation

## 2016-09-09 DIAGNOSIS — Z713 Dietary counseling and surveillance: Secondary | ICD-10-CM | POA: Insufficient documentation

## 2016-09-09 NOTE — Progress Notes (Signed)
Diabetes Self-Management Education  Visit Type:  Follow-up  Appt. Start Time: 1430 Appt. End Time: 1500  09/09/2016  [name], identified by name and date of birth, is a 46 y.o. male with a diagnosis of Diabetes:   Type 2. Other hx includes CHF and HTN.  He typically does not like to check his blood sugar because he does not like to see the numbers.  His A1C has bee >14 for the past 3 months.  He states that at one time his A1C was 5% and he was having a lot of low blood sugar readings so the meal time insulin was discontinued.  He moved from [place] about 6 months ago and did not have insurance initially so was off his medications for a time.  He is currently taking 20 units of Lantus q HS, Metformin, and Januvia.  Patient lives with a roommate.  He does his own shopping and cooking but eats at work on his work days.  He is a Training and development officer at [employer].  He walks to the bus stop on work days and this is his main exercise.  Patient is here today with his interpretor Janetta Hora from [place].  His A1C has decreased significantly from 14.8% to 7.4% in March.  Since the last time I saw him he spent 1 month in [place] family.  He is currently not working until school resumes.  He states that he has enough money for food. He reports increased stress regarding child custody and his wife placed a restraining order.  He has not been checking his blood sugar due to losing it in [place].  Provided patient with a new meter.  Accu-check Guide 12/27/16 exp date, lot [id number].  ASSESSMENT  Weight 210 lb (95.3 kg). Body mass index is 31.01 kg/m.       Diabetes Self-Management Education - 09/09/16 1645      Psychosocial Assessment   Self-care barriers Other (comment)  deaf, requires sign language interpretor   Self-management support Doctor's office   Patient Concerns Glycemic Control   Special Needs Simplified materials   Preferred Learning Style No preference  indicated   Learning Readiness Ready     Pre-Education Assessment   Patient understands the diabetes disease and treatment process. Demonstrates understanding / competency   Patient understands incorporating nutritional management into lifestyle. Needs Review   Patient undertands incorporating physical activity into lifestyle. Needs Review   Patient understands using medications safely. Needs Review   Patient understands monitoring blood glucose, interpreting and using results Needs Review   Patient understands prevention, detection, and treatment of acute complications. Needs Review   Patient understands prevention, detection, and treatment of chronic complications. Needs Review   Patient understands how to develop strategies to address psychosocial issues. Needs Review   Patient understands how to develop strategies to promote health/change behavior. Needs Review     Complications   Last HgB A1C per patient/outside source 7.4 %  06/20/16 decreased from 14.8% 04/18/16   How often do you check your blood sugar? 0 times/day (not testing)     Dietary Intake   Breakfast sometimes sleeps and therefore skips OR cereal or oatmeal (regular)   Lunch skips if he ate breakfast OR fried food if he is working in [employer]) water, regular gingerale when blood sugar feels low, unsweetened hot tea, diet cok     Exercise   Exercise Type Light (walking / raking leaves)     Patient  Education   Previous Diabetes Education Yes (please comment)  05/2016   Nutrition management  Meal options for control of blood glucose level and chronic complications.;Information on hints to eating out and maintain blood glucose control.   Physical activity and exercise  Role of exercise on diabetes management, blood pressure control and cardiac health.   Monitoring Purpose and frequency of SMBG.   Acute complications Taught treatment of hypoglycemia - the 15 rule.   Psychosocial adjustment Worked with  patient to identify barriers to care and solutions;Role of stress on diabetes   Personal strategies to promote health Lifestyle issues that need to be addressed for better diabetes care     Individualized Goals (developed by patient)   Nutrition General guidelines for healthy choices and portions discussed   Physical Activity Exercise 5-7 days per week;30 minutes per day   Medications take my medication as prescribed   Monitoring  test my blood glucose as discussed   Problem Solving regularly scheduled low fat low sugar meals   Reducing Risk examine blood glucose patterns;do foot checks daily   Health Coping discuss diabetes with (comment)  MD/RD     Patient Self-Evaluation of Goals - Patient rates self as meeting previously set goals (% of time)   Nutrition 50 - 75 %   Physical Activity 25 - 50%   Medications >75%   Monitoring 25 - 50%     Post-Education Assessment   Patient understands the diabetes disease and treatment process. Demonstrates understanding / competency   Patient understands incorporating nutritional management into lifestyle. Needs Review   Patient undertands incorporating physical activity into lifestyle. Needs Review   Patient understands using medications safely. Demonstrates understanding / competency   Patient understands monitoring blood glucose, interpreting and using results Needs Review   Patient understands prevention, detection, and treatment of acute complications. Demonstrates understanding / competency   Patient understands prevention, detection, and treatment of chronic complications. Demonstrates understanding / competency   Patient understands how to develop strategies to address psychosocial issues. Needs Review   Patient understands how to develop strategies to promote health/change behavior. Needs Review     Outcomes   Program Status Not Completed     Subsequent Visit   Since your last visit have you continued or begun to take your medications  as prescribed? Yes   Since your last visit have you experienced any weight changes? Loss   Weight Loss (lbs) 3   Since your last visit, are you checking your blood glucose at least once a day? No  lost meter in [place]      Learning Objective:  Patient will have a greater understanding of diabetes self-management. Patient education plan is to attend individual and/or group sessions per assessed needs and concerns.   Plan:   Patient Instructions   Patient Instructions  Avoid adding salt to your food. Avoid processed meats and pickles. Continue to avoid any beverage with sugar.  Begin to check your blood sugar 4 times daily as your doctor wishes. Keep an active lifestyle.  Aim for 30-60 minutes most days. Choose foods that are baked or grilled rather than fried most often. Eat more non starchy vegetables.      Expected Outcomes:  Demonstrated interest in learning. Expect positive outcomes  Education material provided: A1C conversion sheet  If problems or questions, patient to contact team via:  Phone  Future DSME appointment: - 2 months

## 2016-09-09 NOTE — Patient Instructions (Signed)
Patient Instructions  Avoid adding salt to your food. Avoid processed meats and pickles. Continue to avoid any beverage with sugar.  Begin to check your blood sugar 4 times daily as your doctor wishes. Keep an active lifestyle.  Aim for 30-60 minutes most days. Choose foods that are baked or grilled rather than fried most often. Eat more non starchy vegetables.

## 2016-09-15 ENCOUNTER — Ambulatory Visit (INDEPENDENT_AMBULATORY_CARE_PROVIDER_SITE_OTHER): Payer: Medicare Other | Admitting: Internal Medicine

## 2016-09-15 ENCOUNTER — Encounter: Payer: Self-pay | Admitting: Internal Medicine

## 2016-09-15 VITALS — BP 162/110 | HR 97 | Ht 69.0 in | Wt 222.0 lb

## 2016-09-15 DIAGNOSIS — I428 Other cardiomyopathies: Secondary | ICD-10-CM | POA: Insufficient documentation

## 2016-09-15 DIAGNOSIS — Z01812 Encounter for preprocedural laboratory examination: Secondary | ICD-10-CM

## 2016-09-15 DIAGNOSIS — I5022 Chronic systolic (congestive) heart failure: Secondary | ICD-10-CM | POA: Diagnosis not present

## 2016-09-15 HISTORY — DX: Other cardiomyopathies: I42.8

## 2016-09-15 MED ORDER — HYDRALAZINE HCL 50 MG PO TABS: 50.0000 mg | ORAL_TABLET | Freq: Three times a day (TID) | ORAL | 2 refills | Status: DC

## 2016-09-15 NOTE — Progress Notes (Signed)
ELECTROPHYSIOLOGY CONSULT NOTE  Patient ID: Troy Rush, MRN: [id number], DOB/AGE: Jul 02, 1970 46 y.o. Admit date: (Not on file) Date of Consult: 09/15/2016  Primary Physician: Dorena Dew, [place] Primary Cardiologist: Camie Patience is being seen today for the evaluation of ICD implantation at the request of  Dr Zada Finders.   HPI Troy Rush is a 46 y.o. male  Referred for consideration of an ICD.  His a history of nonischemic cardiomyopathy. Echocardiogram 10/16 demonstrated EF 15-20%. Catheterization no significant coronary disease. DATE TEST    10/16 Echo   EF 15-20 %   10/16 Cath  no obstructive CAD    5/18 Echo EF 20-25%    He is marked limitations in activity with DOE < 1 flt of stairs   No PND or orthopnea.  + edema  No CP or palpitations or syncope  He is deaf but this is acquired  Date Cr K Mg  08/29/16  0.87 4.0     09/08/16  2.01 4.5     The notes were reviewed and an effort was made to contact the pt, but not returned   He has sleep disordered breathing with day time somnolence  Past Medical History:  Diagnosis Date  . Bronchitis   . CHF (congestive heart failure) ([place])   . Diabetes mellitus without complication ([place])   . Dyslipidemia   . Hypertension   . NICM (nonischemic cardiomyopathy) ([place]) 09/15/2016  . Pancreatitis       Surgical History:  Past Surgical History:  Procedure Laterality Date  . BACK SURGERY    . CARDIAC CATHETERIZATION    . WRIST SURGERY       Home Meds: Prior to Admission medications   Medication Sig Start Date End Date Taking? Authorizing Provider  aspirin 81 MG chewable tablet Chew 81 mg by mouth daily.   Yes [provider]  atorvastatin (LIPITOR) 80 MG tablet Take 1 tablet (80 mg total) by mouth daily. 06/27/16  Yes Larey Dresser, MD  blood glucose meter kit and supplies KIT Dispense based on patient and insurance preference. Use up to four times daily as directed. (FOR ICD-9 250.00, 250.01). 03/21/16  Yes  Micheline Chapman, NP  carvedilol (COREG) 12.5 MG tablet Take 1 tablet (12.5 mg total) by mouth 2 (two) times daily. 06/27/16 09/25/16 Yes Larey Dresser, MD  citalopram (CELEXA) 40 MG tablet Take 1 tablet (40 mg total) by mouth daily. 03/21/16  Yes Micheline Chapman, NP  digoxin (LANOXIN) 0.125 MG tablet Take 1 tablet (0.125 mg total) by mouth daily. 06/27/16  Yes Larey Dresser, MD  furosemide (LASIX) 40 MG tablet Take 1 tablet (40 mg total) by mouth daily. Take an additional 40 mg (1 tablet) for weight gain >3 lb in 1 day or >5 lb in 1 week. 06/27/16  Yes Larey Dresser, MD  Insulin Glargine (LANTUS SOLOSTAR) 100 UNIT/ML Solostar Pen Inject 20 Units into the skin daily at 10 pm. 04/18/16  Yes Dorena Dew, FNP  INSULIN SYRINGE .5CC/29G (B-D INS SYR ULTRAFINE .5CC/29G) 29G X 1/2" 0.5 ML MISC 1 each by Does not apply route 3 (three) times daily before meals. 04/18/16  Yes Dorena Dew, FNP  isosorbide mononitrate (IMDUR) 30 MG 24 hr tablet Take 1 tablet (30 mg total) by mouth daily. 06/27/16 09/25/16 Yes Larey Dresser, MD  ivabradine (CORLANOR) 5 MG TABS tablet Take 1 tablet (5 mg total) by mouth 2 (two) times daily  with a meal. 06/27/16  Yes Larey Dresser, MD  metFORMIN (GLUCOPHAGE) 1000 MG tablet Take 1 tablet (1,000 mg total) by mouth 2 (two) times daily with a meal. 03/28/16  Yes Micheline Chapman, NP  Patiromer Sorbitex Calcium (VELTASSA) 16.8 g PACK Take 16.8 g by mouth daily.    Yes [provider]  promethazine (PHENERGAN) 25 MG tablet Take 1 tablet (25 mg total) by mouth every 6 (six) hours as needed for nausea or vomiting. 02/02/16  Yes Shary Decamp, PA-C  sacubitril-valsartan (ENTRESTO) 49-51 MG Take 1 tablet by mouth 2 (two) times daily. 08/29/16  Yes Larey Dresser, MD  sitaGLIPtin (JANUVIA) 50 MG tablet Take 1 tablet (50 mg total) by mouth daily. 04/18/16  Yes Dorena Dew, FNP  spironolactone (ALDACTONE) 25 MG tablet Take 1 tablet (25 mg total) by mouth daily.  06/27/16 10/25/16 Yes Larey Dresser, MD  hydrALAZINE (APRESOLINE) 25 MG tablet Take 1 tablet (25 mg total) by mouth 3 (three) times daily. 05/30/16 08/28/16  Larey Dresser, MD    Allergies:  Allergies  Allergen Reactions  . Lisinopril Cough    Social History   Social History  . Marital status: Legally Separated    Spouse name: N/A  . Number of children: N/A  . Years of education: N/A   Occupational History  . Not on file.   Social History Main Topics  . Smoking status: Never Smoker  . Smokeless tobacco: Never Used  . Alcohol use Yes     Comment: rare  . Drug use: No  . Sexual activity: Not on file   Other Topics Concern  . Not on file   Social History Narrative  . No narrative on file     Family History  Problem Relation Age of Onset  . Hypertension Mother      ROS:  Please see the history of present illness.     All other systems reviewed and negative.    Physical Exam  Blood pressure (!) 162/110, pulse 97, height _0  (1.753 m), weight 222 lb (100.7 kg), SpO2 98 %. General: Well developed, well nourished male in no acute distress. Head: Normocephalic, atraumatic, sclera non-icteric, no xanthomas, nares are without discharge. EENT: normal  Lymph Nodes:  none Neck: Negative for carotid bruits. JVD not elevated. Back:without scoliosis kyphosis Lungs: Clear bilaterally to auscultation without wheezes, rales, or rhonchi. Breathing is unlabored. Heart: RRR with S1 S2. No murmur . No rubs, or gallops appreciated. Abdomen: Soft, non-tender, non-distended with normoactive bowel sounds. No hepatomegaly. No rebound/guarding. No obvious abdominal masses. Msk:  Strength and tone appear normal for age. Extremities: No clubbing or cyanosis. 1+  edema.  Distal pedal pulses are 2+ and equal bilaterally. Skin: Warm and Dry Neuro: Alert and oriented X 3. CN III-XII intact Grossly normal sensory and motor function . Psych:  Responds to questions appropriately with a normal  affect.      Labs: Cardiac Enzymes No results for input(s): CKTOTAL, CKMB, TROPONINI in the last 72 hours. CBC Lab Results  Component Value Date   WBC 4.0 03/21/2016   HGB 12.5 (L) 03/21/2016   HCT 41.6 03/21/2016   MCV 88.9 03/21/2016   PLT 199 03/21/2016   PROTIME: No results for input(s): LABPROT, INR in the last 72 hours. Chemistry No results for input(s): NA, K, CL, CO2, BUN, CREATININE, CALCIUM, PROT, BILITOT, ALKPHOS, ALT, AST, GLUCOSE in the last 168 hours.  Invalid input(s): LABALBU Lipids Lab Results  Component Value  Date   CHOL 233 (H) 03/21/2016   HDL 55 03/21/2016   LDLCALC 110 (H) 03/21/2016   TRIG 339 (H) 03/21/2016   BNP No results found for: PROBNP Thyroid Function Tests: No results for input(s): TSH, T4TOTAL, T3FREE, THYROIDAB in the last 72 hours.  Invalid input(s): FREET3 Miscellaneous No results found for: DDIMER  Radiology/Studies:  No results found.  EKG:  Sinus @ 84 20/09/41   Assessment and Plan:  Nonischemic cardiomyopathy  Congestive heart failure-chronic-systolic IIb-IIIa  Hypertension-poorly controlled  Sleep disordered breathing question sleep apnea  Deafness-noncongenital  Hyperlipidemia  Renal failure acute chronic    The patient has persistent left ventricular dysfunction and ongoing congestive heart failure. It is appropriate that we considered for ICD for primary prevention particularly in light of the [place] trial  we have reviewed risks and benefits and he is agreeable to proceeding.  He is well medicated on guideline directed therapy, however, his blood pressure remains high. We will increase his hydralazine from 25-every 8--50 every 12. We will also increase his Entresto from 47/53--97/103.  I am struck by the rapid change his creatinine earlier this month. We will need to repeat this prior to instituting the second change.  His poorly controlled blood pressure may be aggravated by what sounds like sleep apnea.  Is not clear but he may have had a sleep study years ago. It should be repeated.   Given his deafness, and the need to secure his arms for ICD implantation, I have told him that undertaking general anesthesia was able to avoid the inability to communicate             Virl Axe

## 2016-09-15 NOTE — Patient Instructions (Addendum)
Medication Instructions:  Your physician has recommended you make the following change in your medication:  INCREASE Hydralazine to 50 mg three times daily  Labwork: TODAY: BMET  Pre-procedure BMET / CBC labs on  10/05/16  at the [facility] office  Testing/Procedures: Your physician has recommended that you have a defibrillator inserted. An implantable cardioverter defibrillator (ICD) is a small device that is placed in your chest or, in rare cases, your abdomen. This device uses electrical pulses or shocks to help control life-threatening, irregular heartbeats that could lead the heart to suddenly stop beating (sudden cardiac arrest). Leads are attached to the ICD that goes into your heart. This is done in the hospital and usually requires an overnight stay. Please see the instruction sheet given to you today for more information.     Follow-Up: Follow-up for a wound check in the [facility] 10-14 days from date of procedure and with Dr. Caryl Comes 91 days from date of implant..   Any Other Special Instructions Will Be Listed Below ---  Please wash with the CHG Soap the night before and morning of procedure (follow instruction page "Preparing For Surgery").   Report [facility] on 10/14/16 at 10:30 AM  Nothing to eat or drink after midnight the night before procedure  Do not take any medication the morning prior to your procedrue  Plan 1 night stay    If you need a refill on your cardiac medications before your next appointment, please call your pharmacy.

## 2016-09-16 ENCOUNTER — Encounter (HOSPITAL_COMMUNITY): Payer: Self-pay | Admitting: *Deleted

## 2016-09-16 ENCOUNTER — Encounter: Payer: Self-pay | Admitting: *Deleted

## 2016-09-16 ENCOUNTER — Other Ambulatory Visit: Payer: Self-pay

## 2016-09-16 LAB — BASIC METABOLIC PANEL
BUN / CREAT RATIO: 17 (ref 9–20)
BUN: 17 mg/dL (ref 6–24)
CALCIUM: 9.7 mg/dL (ref 8.7–10.2)
CHLORIDE: 95 mmol/L — AB (ref 96–106)
CO2: 25 mmol/L (ref 18–29)
Creatinine, Ser: 0.99 mg/dL (ref 0.76–1.27)
GFR, EST AFRICAN AMERICAN: 104 mL/min/{1.73_m2} (ref >59)
GFR, EST NON AFRICAN AMERICAN: 90 mL/min/{1.73_m2} (ref >59)
Glucose: 372 mg/dL — ABNORMAL HIGH (ref 65–99)
Potassium: 4.8 mmol/L (ref 3.5–5.2)
Sodium: 135 mmol/L (ref 134–144)

## 2016-09-16 MED ORDER — SITAGLIPTIN PHOSPHATE 50 MG PO TABS: 50.0000 mg | ORAL_TABLET | Freq: Every day | ORAL | 2 refills | Status: DC

## 2016-09-16 NOTE — Telephone Encounter (Signed)
Refill for januvia sent into pharmacy. Thanks!  

## 2016-09-19 ENCOUNTER — Other Ambulatory Visit (HOSPITAL_COMMUNITY): Payer: Self-pay | Admitting: *Deleted

## 2016-09-19 MED ORDER — SACUBITRIL-VALSARTAN 24-26 MG PO TABS: 1.0000 | ORAL_TABLET | Freq: Two times a day (BID) | ORAL | 3 refills | Status: DC

## 2016-09-19 MED ORDER — FUROSEMIDE 20 MG PO TABS: 20.0000 mg | ORAL_TABLET | Freq: Every day | ORAL | 3 refills | Status: DC

## 2016-09-28 ENCOUNTER — Encounter: Payer: Self-pay | Admitting: Family Medicine

## 2016-09-30 ENCOUNTER — Ambulatory Visit: Payer: BLUE CROSS/BLUE SHIELD | Admitting: Family Medicine

## 2016-10-05 ENCOUNTER — Other Ambulatory Visit: Payer: Medicare Other | Admitting: *Deleted

## 2016-10-05 DIAGNOSIS — I5022 Chronic systolic (congestive) heart failure: Secondary | ICD-10-CM | POA: Diagnosis not present

## 2016-10-05 DIAGNOSIS — Z01812 Encounter for preprocedural laboratory examination: Secondary | ICD-10-CM

## 2016-10-05 DIAGNOSIS — I428 Other cardiomyopathies: Secondary | ICD-10-CM | POA: Diagnosis not present

## 2016-10-06 ENCOUNTER — Encounter: Payer: Self-pay | Admitting: Internal Medicine

## 2016-10-06 ENCOUNTER — Encounter (HOSPITAL_COMMUNITY): Payer: Self-pay | Admitting: Cardiology

## 2016-10-06 LAB — CBC
HEMOGLOBIN: 11.6 g/dL — AB (ref 13.0–17.7)
Hematocrit: 36.7 % — ABNORMAL LOW (ref 37.5–51.0)
MCH: 28.2 pg (ref 26.6–33.0)
MCHC: 31.6 g/dL (ref 31.5–35.7)
MCV: 89 fL (ref 79–97)
Platelets: 227 10*3/uL (ref 150–379)
RBC: 4.12 x10E6/uL — ABNORMAL LOW (ref 4.14–5.80)
RDW: 14.5 % (ref 12.3–15.4)
WBC: 3.6 10*3/uL (ref 3.4–10.8)

## 2016-10-06 LAB — BASIC METABOLIC PANEL
BUN/Creatinine Ratio: 14 (ref 9–20)
BUN: 16 mg/dL (ref 6–24)
CALCIUM: 9.7 mg/dL (ref 8.7–10.2)
CO2: 24 mmol/L (ref 20–29)
Chloride: 101 mmol/L (ref 96–106)
Creatinine, Ser: 1.15 mg/dL (ref 0.76–1.27)
GFR, EST AFRICAN AMERICAN: 87 mL/min/{1.73_m2} (ref >59)
GFR, EST NON AFRICAN AMERICAN: 75 mL/min/{1.73_m2} (ref >59)
Glucose: 100 mg/dL — ABNORMAL HIGH (ref 65–99)
Potassium: 4.1 mmol/L (ref 3.5–5.2)
Sodium: 140 mmol/L (ref 134–144)

## 2016-10-07 ENCOUNTER — Ambulatory Visit (INDEPENDENT_AMBULATORY_CARE_PROVIDER_SITE_OTHER): Payer: Medicare Other | Admitting: Family Medicine

## 2016-10-07 ENCOUNTER — Encounter: Payer: Self-pay | Admitting: Family Medicine

## 2016-10-07 VITALS — BP 152/92 | HR 88 | Temp 97.9°F | Resp 16 | Ht 69.0 in | Wt 224.0 lb

## 2016-10-07 DIAGNOSIS — E118 Type 2 diabetes mellitus with unspecified complications: Secondary | ICD-10-CM

## 2016-10-07 DIAGNOSIS — H9193 Unspecified hearing loss, bilateral: Secondary | ICD-10-CM

## 2016-10-07 DIAGNOSIS — Z794 Long term (current) use of insulin: Secondary | ICD-10-CM

## 2016-10-07 LAB — GLUCOSE, CAPILLARY: GLUCOSE-CAPILLARY: 87 mg/dL (ref 65–99)

## 2016-10-07 LAB — POCT GLYCOSYLATED HEMOGLOBIN (HGB A1C): Hemoglobin A1C: 9

## 2016-10-07 MED ORDER — GLUCOSE BLOOD VI STRP: ORAL_STRIP | 12 refills | Status: DC

## 2016-10-07 MED ORDER — ACCU-CHEK AVIVA PLUS W/DEVICE KIT: 1.0000 | PACK | Freq: Three times a day (TID) | 0 refills | Status: DC

## 2016-10-07 NOTE — Progress Notes (Signed)
Subjective:    Patient ID: Troy Rush, male    DOB: 1970-08-16, 46 y.o.   MRN: [id number] Troy Rush, a 46 year old hearing impaired male presents accompanied by an interpreter for a follow up of diabetes. Troy Rush just recently returned from visiting [place]. He says that he was gone for 1 month and did not take medications with him.  Hemoglobin a1C was 7.4 and has increased to 9.0.  Diabetes  He presents for his follow-up diabetic visit. He has type 2 diabetes mellitus. His disease course has been worsening. Pertinent negatives for hypoglycemia include no confusion, dizziness, headaches, hunger, mood changes, nervousness/anxiousness, pallor, seizures, sleepiness, speech difficulty, sweats or tremors. Associated symptoms include polydipsia. Pertinent negatives for diabetes include no blurred vision, no chest pain, no fatigue, no foot paresthesias, no foot ulcerations, no polyphagia, no polyuria, no visual change, no weakness and no weight loss. Pertinent negatives for hypoglycemia complications include no hospitalization. Pertinent negatives for diabetic complications include no CVA or heart disease. Risk factors for coronary artery disease include diabetes mellitus and hypertension. Current diabetic treatment includes diet and oral agent (monotherapy). He is compliant with treatment most of the time. His weight is stable. He is following a high fat/cholesterol diet. When asked about meal planning, he reported none. He has not had a previous visit with a dietitian. He rarely participates in exercise. An ACE inhibitor/angiotensin II receptor blocker is not being taken. He does not see a podiatrist.Eye exam is not current.   Past Medical History:  Diagnosis Date  . Bronchitis   . CHF (congestive heart failure) ([place])   . Diabetes mellitus without complication ([place])   . Dyslipidemia   . Hypertension   . NICM (nonischemic cardiomyopathy) ([place]) 09/15/2016  . Pancreatitis    Social History   Social  History  . Marital status: Legally Separated    Spouse name: N/A  . Number of children: N/A  . Years of education: N/A   Occupational History  . Not on file.   Social History Main Topics  . Smoking status: Never Smoker  . Smokeless tobacco: Never Used  . Alcohol use Yes     Comment: rare  . Drug use: No  . Sexual activity: Not on file   Other Topics Concern  . Not on file   Social History Narrative  . No narrative on file   Immunization History  Administered Date(s) Administered  . Influenza,inj,Quad PF,36+ Mos 03/21/2016  . Pneumococcal Polysaccharide-23 06/29/2016  . Tdap 06/29/2016   Review of Systems  Constitutional: Negative.  Negative for fatigue and weight loss.  HENT: Negative.   Eyes: Negative.  Negative for blurred vision.  Respiratory: Negative.   Cardiovascular: Negative.  Negative for chest pain.  Gastrointestinal: Negative.   Endocrine: Positive for polydipsia. Negative for polyphagia and polyuria.  Musculoskeletal: Negative.   Skin: Negative.  Negative for pallor.  Neurological: Positive for numbness. Negative for dizziness, tremors, seizures, speech difficulty, weakness and headaches.  Hematological: Negative.   Psychiatric/Behavioral: Negative.  Negative for confusion. The patient is not nervous/anxious.        Objective:   Physical Exam  Constitutional: He is oriented to person, place, and time. He appears well-developed and well-nourished.  HENT:  Head: Normocephalic and atraumatic.  Right Ear: External ear normal.  Left Ear: External ear normal.  Mouth/Throat: Oropharynx is clear and moist.  Eyes: Conjunctivae and EOM are normal. Pupils are equal, round, and reactive to light.  Neck: Normal range of motion.  Neck supple.  Pulmonary/Chest: Effort normal and breath sounds normal.  Abdominal: Soft. Bowel sounds are normal.  Neurological: He is alert and oriented to person, place, and time. He has normal reflexes.  Skin: Skin is warm and dry.   Psychiatric: He has a normal mood and affect. His behavior is normal. Judgment and thought content normal.      BP (!) 156/94 (BP Location: Left Arm, Patient Position: Sitting, Cuff Size: Large) Comment: manual  Pulse 88   Temp 97.9 F (36.6 C) (Oral)   Resp 16   Ht '5\' 9"'  (1.753 m)   Wt 224 lb (101.6 kg)   SpO2 100%   BMI 33.08 kg/m  Assessment & Plan:   1. Type 2 diabetes mellitus with complication, unspecified long term insulin use status (HCC) Reviewed hemoglobin a1C, has increased from 7.4 to 9.0. There have been some probable  compliance issues here. I have discussed with him the great importance of following the treatment plan exactly as directed in order to achieve a good medical outcome. Counseled on diet and medication regimen at length. Will follow up in office in 1 month.  Continue to check blood sugars prior to meals and at bed time. Bring glucometer to follow up appointment. Reviewed CBG.  - HgB A1c - Blood Glucose Monitoring Suppl (ACCU-CHEK AVIVA PLUS) w/Device KIT; 1 each by Does not apply route 4 (four) times daily -  before meals and at bedtime.  Dispense: 1 kit; Refill: 0 - glucose blood (ACCU-CHEK AVIVA PLUS) test strip; Check blood three times per day and at bedtime  Dispense: 100 each; Refill: 12  2. Deaf Utilized interpreter to assist with communication  RTC: 1 month for DMII   Aidden Markovic Al Decant  MSN, FNP-C [facility] [address] [place], [place] [address] [phone]

## 2016-10-07 NOTE — Patient Instructions (Addendum)
1. Uncontrolled diabetes You hemoglobin a1C has increased to 9.4 from 7.4 three months ago. We will continue Lantus 20 units at bedtime.  Januvia 50 mg with breakfast Metformin 1000 mg with breakfast and dinner  Check blood sugars 3 times per day and with meals  Recommend a lowfat, low carbohydrate diet divided over 5-6 small meals, increase water intake to 6-8 glasses, and 150 minutes per week of cardiovascular exercise.       Diabetes Mellitus and Food It is important for you to manage your blood sugar (glucose) level. Your blood glucose level can be greatly affected by what you eat. Eating healthier foods in the appropriate amounts throughout the day at about the same time each day will help you control your blood glucose level. It can also help slow or prevent worsening of your diabetes mellitus. Healthy eating may even help you improve the level of your blood pressure and reach or maintain a healthy weight. General recommendations for healthful eating and cooking habits include:  Eating meals and snacks regularly. Avoid going long periods of time without eating to lose weight.  Eating a diet that consists mainly of plant-based foods, such as fruits, vegetables, nuts, legumes, and whole grains.  Using low-heat cooking methods, such as baking, instead of high-heat cooking methods, such as deep frying.  Work with your dietitian to make sure you understand how to use the Nutrition Facts information on food labels. How can food affect me? Carbohydrates Carbohydrates affect your blood glucose level more than any other type of food. Your dietitian will help you determine how many carbohydrates to eat at each meal and teach you how to count carbohydrates. Counting carbohydrates is important to keep your blood glucose at a healthy level, especially if you are using insulin or taking certain medicines for diabetes mellitus. Alcohol Alcohol can cause sudden decreases in blood glucose  (hypoglycemia), especially if you use insulin or take certain medicines for diabetes mellitus. Hypoglycemia can be a life-threatening condition. Symptoms of hypoglycemia (sleepiness, dizziness, and disorientation) are similar to symptoms of having too much alcohol. If your health care provider has given you approval to drink alcohol, do so in moderation and use the following guidelines:  Women should not have more than one drink per day, and men should not have more than two drinks per day. One drink is equal to: ? 12 oz of beer. ? 5 oz of wine. ? 1 oz of hard liquor.  Do not drink on an empty stomach.  Keep yourself hydrated. Have water, diet soda, or unsweetened iced tea.  Regular soda, juice, and other mixers might contain a lot of carbohydrates and should be counted.  What foods are not recommended? As you make food choices, it is important to remember that all foods are not the same. Some foods have fewer nutrients per serving than other foods, even though they might have the same number of calories or carbohydrates. It is difficult to get your body what it needs when you eat foods with fewer nutrients. Examples of foods that you should avoid that are high in calories and carbohydrates but low in nutrients include:  Trans fats (most processed foods list trans fats on the Nutrition Facts label).  Regular soda.  Juice.  Candy.  Sweets, such as cake, pie, doughnuts, and cookies.  Fried foods.  What foods can I eat? Eat nutrient-rich foods, which will nourish your body and keep you healthy. The food you should eat also will depend on several  factors, including:  The calories you need.  The medicines you take.  Your weight.  Your blood glucose level.  Your blood pressure level.  Your cholesterol level.  You should eat a variety of foods, including:  Protein. ? Lean cuts of meat. ? Proteins low in saturated fats, such as fish, egg whites, and beans. Avoid processed  meats.  Fruits and vegetables. ? Fruits and vegetables that may help control blood glucose levels, such as apples, mangoes, and yams.  Dairy products. ? Choose fat-free or low-fat dairy products, such as milk, yogurt, and cheese.  Grains, bread, pasta, and rice. ? Choose whole grain products, such as multigrain bread, whole oats, and brown rice. These foods may help control blood pressure.  Fats. ? Foods containing healthful fats, such as nuts, avocado, olive oil, canola oil, and fish.  Does everyone with diabetes mellitus have the same meal plan? Because every person with diabetes mellitus is different, there is not one meal plan that works for everyone. It is very important that you meet with a dietitian who will help you create a meal plan that is just right for you. This information is not intended to replace advice given to you by your health care provider. Make sure you discuss any questions you have with your health care provider. Document Released: 12/23/2004 Document Revised: 09/03/2015 Document Reviewed: 02/22/2013 Elsevier Interactive Patient Education  2017 [employer].

## 2016-10-10 LAB — POCT URINALYSIS DIP (DEVICE)
Bilirubin Urine: NEGATIVE
Glucose, UA: NEGATIVE mg/dL
HGB URINE DIPSTICK: NEGATIVE
Ketones, ur: NEGATIVE mg/dL
Leukocytes, UA: NEGATIVE
NITRITE: NEGATIVE
PH: 6.5 (ref 5.0–8.0)
Protein, ur: 100 mg/dL — AB
Specific Gravity, Urine: 1.02 (ref 1.005–1.030)
Urobilinogen, UA: 0.2 mg/dL (ref 0.0–1.0)

## 2016-10-14 ENCOUNTER — Ambulatory Visit: Payer: Medicare Other | Admitting: Family Medicine

## 2016-10-14 ENCOUNTER — Encounter (HOSPITAL_COMMUNITY): Payer: Medicare Other | Admitting: Cardiology

## 2016-10-14 ENCOUNTER — Encounter (HOSPITAL_COMMUNITY): Payer: Self-pay | Admitting: Certified Registered Nurse Anesthetist

## 2016-10-14 ENCOUNTER — Ambulatory Visit (HOSPITAL_COMMUNITY): Payer: Medicare Other | Admitting: Certified Registered Nurse Anesthetist

## 2016-10-14 ENCOUNTER — Encounter (HOSPITAL_COMMUNITY)
Admission: RE | Disposition: A | Discharge status: Home / Self Care | Payer: Self-pay | Source: Ambulatory Visit | Attending: Internal Medicine

## 2016-10-14 ENCOUNTER — Ambulatory Visit (HOSPITAL_COMMUNITY)
Admission: RE | Admit: 2016-10-14 | Discharge: 2016-10-15 | Disposition: A | Discharge status: Home / Self Care | Payer: Medicare Other | Source: Ambulatory Visit | Attending: Internal Medicine | Admitting: Internal Medicine

## 2016-10-14 DIAGNOSIS — I429 Cardiomyopathy, unspecified: Secondary | ICD-10-CM | POA: Diagnosis not present

## 2016-10-14 DIAGNOSIS — Z006 Encounter for examination for normal comparison and control in clinical research program: Secondary | ICD-10-CM | POA: Insufficient documentation

## 2016-10-14 DIAGNOSIS — E785 Hyperlipidemia, unspecified: Secondary | ICD-10-CM | POA: Diagnosis not present

## 2016-10-14 DIAGNOSIS — I5022 Chronic systolic (congestive) heart failure: Secondary | ICD-10-CM | POA: Diagnosis not present

## 2016-10-14 DIAGNOSIS — Z959 Presence of cardiac and vascular implant and graft, unspecified: Secondary | ICD-10-CM

## 2016-10-14 DIAGNOSIS — N182 Chronic kidney disease, stage 2 (mild): Secondary | ICD-10-CM | POA: Insufficient documentation

## 2016-10-14 DIAGNOSIS — I255 Ischemic cardiomyopathy: Secondary | ICD-10-CM | POA: Diagnosis present

## 2016-10-14 DIAGNOSIS — H9190 Unspecified hearing loss, unspecified ear: Secondary | ICD-10-CM | POA: Diagnosis not present

## 2016-10-14 DIAGNOSIS — I130 Hypertensive heart and chronic kidney disease with heart failure and stage 1 through stage 4 chronic kidney disease, or unspecified chronic kidney disease: Secondary | ICD-10-CM | POA: Diagnosis not present

## 2016-10-14 DIAGNOSIS — I428 Other cardiomyopathies: Secondary | ICD-10-CM

## 2016-10-14 DIAGNOSIS — N179 Acute kidney failure, unspecified: Secondary | ICD-10-CM | POA: Diagnosis not present

## 2016-10-14 DIAGNOSIS — Z7982 Long term (current) use of aspirin: Secondary | ICD-10-CM | POA: Insufficient documentation

## 2016-10-14 DIAGNOSIS — Z794 Long term (current) use of insulin: Secondary | ICD-10-CM | POA: Diagnosis not present

## 2016-10-14 DIAGNOSIS — Z95810 Presence of automatic (implantable) cardiac defibrillator: Secondary | ICD-10-CM

## 2016-10-14 HISTORY — DX: Major depressive disorder, single episode, unspecified: F32.9

## 2016-10-14 HISTORY — DX: Presence of automatic (implantable) cardiac defibrillator: Z95.810

## 2016-10-14 HISTORY — PX: ICD IMPLANT: EP1208

## 2016-10-14 HISTORY — DX: Depression, unspecified: F32.A

## 2016-10-14 LAB — GLUCOSE, CAPILLARY
GLUCOSE-CAPILLARY: 110 mg/dL — AB (ref 65–99)
GLUCOSE-CAPILLARY: 102 mg/dL — AB (ref 65–99)
GLUCOSE-CAPILLARY: 159 mg/dL — AB (ref 65–99)
GLUCOSE-CAPILLARY: 179 mg/dL — AB (ref 65–99)

## 2016-10-14 LAB — SURGICAL PCR SCREEN
MRSA, PCR: NEGATIVE
STAPHYLOCOCCUS AUREUS: NEGATIVE

## 2016-10-14 SURGERY — ICD IMPLANT
Anesthesia: Monitor Anesthesia Care

## 2016-10-14 MED ORDER — LINAGLIPTIN 5 MG PO TABS
5.0000 mg | ORAL_TABLET | Freq: Every day | ORAL | Status: DC
  Administered 2016-10-15: 5 mg via ORAL
  Filled 2016-10-14 (×2): qty 1

## 2016-10-14 MED ORDER — ACCU-CHEK AVIVA PLUS W/DEVICE KIT: 1.0000 | PACK | Freq: Three times a day (TID) | Status: DC

## 2016-10-14 MED ORDER — SODIUM CHLORIDE 0.9 % IR SOLN
80.0000 mg | Status: AC
  Administered 2016-10-14: 80 mg

## 2016-10-14 MED ORDER — MUPIROCIN 2 % EX OINT
TOPICAL_OINTMENT | CUTANEOUS | Status: AC
  Administered 2016-10-14: 1 via NASAL
  Filled 2016-10-14: qty 22

## 2016-10-14 MED ORDER — FENTANYL CITRATE (PF) 100 MCG/2ML IJ SOLN: 25.0000 ug | INTRAMUSCULAR | Status: DC | PRN

## 2016-10-14 MED ORDER — ONDANSETRON HCL 4 MG/2ML IJ SOLN
INTRAMUSCULAR | Status: DC | PRN
Start: 2016-10-14 — End: 2016-10-14
  Administered 2016-10-14: 4 mg via INTRAVENOUS

## 2016-10-14 MED ORDER — SPIRONOLACTONE 25 MG PO TABS
25.0000 mg | ORAL_TABLET | Freq: Every day | ORAL | Status: DC
  Administered 2016-10-14 – 2016-10-15 (×2): 25 mg via ORAL
  Filled 2016-10-14 (×2): qty 1

## 2016-10-14 MED ORDER — "INSULIN SYRINGE 29G X 1/2"" 0.5 ML MISC": 1.0000 | Freq: Three times a day (TID) | Status: DC

## 2016-10-14 MED ORDER — ONDANSETRON HCL 4 MG/2ML IJ SOLN: 4.0000 mg | Freq: Four times a day (QID) | INTRAMUSCULAR | Status: DC | PRN

## 2016-10-14 MED ORDER — MIDAZOLAM HCL 5 MG/5ML IJ SOLN
INTRAMUSCULAR | Status: DC | PRN
  Administered 2016-10-14: 2 mg via INTRAVENOUS

## 2016-10-14 MED ORDER — YOU HAVE A PACEMAKER BOOK
Freq: Once | Status: AC
  Administered 2016-10-14: 21:00:00
  Filled 2016-10-14: qty 1

## 2016-10-14 MED ORDER — IOPAMIDOL (ISOVUE-370) INJECTION 76%
INTRAVENOUS | Status: AC
  Filled 2016-10-14: qty 50

## 2016-10-14 MED ORDER — HYDRALAZINE HCL 50 MG PO TABS
50.0000 mg | ORAL_TABLET | Freq: Three times a day (TID) | ORAL | Status: DC
  Administered 2016-10-14 – 2016-10-15 (×4): 50 mg via ORAL
  Filled 2016-10-14 (×6): qty 1

## 2016-10-14 MED ORDER — ATORVASTATIN CALCIUM 80 MG PO TABS
80.0000 mg | ORAL_TABLET | Freq: Every day | ORAL | Status: DC
  Administered 2016-10-14: 80 mg via ORAL
  Filled 2016-10-14: qty 1

## 2016-10-14 MED ORDER — SODIUM CHLORIDE 0.9 % IR SOLN
Status: AC
  Filled 2016-10-14: qty 2

## 2016-10-14 MED ORDER — CEFAZOLIN SODIUM-DEXTROSE 1-4 GM/50ML-% IV SOLN
1.0000 g | Freq: Four times a day (QID) | INTRAVENOUS | Status: AC
  Administered 2016-10-14 – 2016-10-15 (×3): 1 g via INTRAVENOUS
  Filled 2016-10-14 (×3): qty 50

## 2016-10-14 MED ORDER — CEFAZOLIN SODIUM-DEXTROSE 2-4 GM/100ML-% IV SOLN
INTRAVENOUS | Status: AC
  Filled 2016-10-14: qty 100

## 2016-10-14 MED ORDER — FENTANYL CITRATE (PF) 100 MCG/2ML IJ SOLN
INTRAMUSCULAR | Status: DC | PRN
  Administered 2016-10-14 (×4): 25 ug via INTRAVENOUS

## 2016-10-14 MED ORDER — MUPIROCIN 2 % EX OINT
TOPICAL_OINTMENT | Freq: Once | CUTANEOUS | Status: AC
  Administered 2016-10-14: 1 via NASAL

## 2016-10-14 MED ORDER — METFORMIN HCL 500 MG PO TABS
1000.0000 mg | ORAL_TABLET | Freq: Two times a day (BID) | ORAL | Status: DC
  Administered 2016-10-14 – 2016-10-15 (×2): 1000 mg via ORAL
  Filled 2016-10-14 (×2): qty 2

## 2016-10-14 MED ORDER — OXYCODONE HCL 5 MG PO TABS: 5.0000 mg | ORAL_TABLET | Freq: Once | ORAL | Status: DC | PRN

## 2016-10-14 MED ORDER — SACUBITRIL-VALSARTAN 24-26 MG PO TABS
1.0000 | ORAL_TABLET | Freq: Two times a day (BID) | ORAL | Status: DC
  Administered 2016-10-14 – 2016-10-15 (×2): 1 via ORAL
  Filled 2016-10-14 (×2): qty 1

## 2016-10-14 MED ORDER — ISOSORBIDE MONONITRATE ER 30 MG PO TB24
30.0000 mg | ORAL_TABLET | Freq: Every day | ORAL | Status: DC
  Administered 2016-10-14 – 2016-10-15 (×2): 30 mg via ORAL
  Filled 2016-10-14 (×2): qty 1

## 2016-10-14 MED ORDER — SODIUM CHLORIDE 0.9 % IV SOLN: INTRAVENOUS | Status: AC

## 2016-10-14 MED ORDER — ACETAMINOPHEN 325 MG PO TABS
325.0000 mg | ORAL_TABLET | ORAL | Status: DC | PRN
  Administered 2016-10-14: 650 mg via ORAL
  Filled 2016-10-14: qty 2

## 2016-10-14 MED ORDER — OXYCODONE HCL 5 MG/5ML PO SOLN: 5.0000 mg | Freq: Once | ORAL | Status: DC | PRN

## 2016-10-14 MED ORDER — CHLORHEXIDINE GLUCONATE 4 % EX LIQD: 60.0000 mL | Freq: Once | CUTANEOUS | Status: DC

## 2016-10-14 MED ORDER — FUROSEMIDE 20 MG PO TABS
20.0000 mg | ORAL_TABLET | Freq: Every day | ORAL | Status: DC
Start: 2016-10-15 — End: 2016-10-15
  Administered 2016-10-15: 08:00:00 20 mg via ORAL
  Filled 2016-10-14: qty 1

## 2016-10-14 MED ORDER — LIDOCAINE HCL (PF) 1 % IJ SOLN
INTRAMUSCULAR | Status: AC
  Filled 2016-10-14: qty 60

## 2016-10-14 MED ORDER — CEFAZOLIN SODIUM-DEXTROSE 2-4 GM/100ML-% IV SOLN
2.0000 g | INTRAVENOUS | Status: AC
  Administered 2016-10-14: 2 g via INTRAVENOUS

## 2016-10-14 MED ORDER — SODIUM CHLORIDE 0.9 % IV SOLN
INTRAVENOUS | Status: DC
  Administered 2016-10-14: 10:00:00 via INTRAVENOUS

## 2016-10-14 MED ORDER — CITALOPRAM HYDROBROMIDE 20 MG PO TABS
40.0000 mg | ORAL_TABLET | Freq: Every day | ORAL | Status: DC
Start: 2016-10-14 — End: 2016-10-14

## 2016-10-14 MED ORDER — GLYCOPYRROLATE 0.2 MG/ML IV SOSY
PREFILLED_SYRINGE | INTRAVENOUS | Status: DC | PRN
  Administered 2016-10-14 (×2): .2 mg via INTRAVENOUS

## 2016-10-14 MED ORDER — ONDANSETRON HCL 4 MG/2ML IJ SOLN
4.0000 mg | Freq: Four times a day (QID) | INTRAMUSCULAR | Status: DC | PRN
  Administered 2016-10-14: 23:00:00 4 mg via INTRAVENOUS
  Filled 2016-10-14 (×2): qty 2

## 2016-10-14 MED ORDER — IVABRADINE HCL 5 MG PO TABS
5.0000 mg | ORAL_TABLET | Freq: Two times a day (BID) | ORAL | Status: DC
  Administered 2016-10-14 – 2016-10-15 (×2): 5 mg via ORAL
  Filled 2016-10-14 (×2): qty 1

## 2016-10-14 MED ORDER — INSULIN GLARGINE 100 UNIT/ML ~~LOC~~ SOLN
20.0000 [IU] | Freq: Every day | SUBCUTANEOUS | Status: DC
  Administered 2016-10-14: 21:00:00 20 [IU] via SUBCUTANEOUS
  Filled 2016-10-14: qty 0.2

## 2016-10-14 MED ORDER — DIGOXIN 125 MCG PO TABS
0.1250 mg | ORAL_TABLET | Freq: Every day | ORAL | Status: DC
  Administered 2016-10-14 – 2016-10-15 (×2): 0.125 mg via ORAL
  Filled 2016-10-14 (×2): qty 1

## 2016-10-14 MED ORDER — PROPOFOL 500 MG/50ML IV EMUL
INTRAVENOUS | Status: DC | PRN
  Administered 2016-10-14: 100 ug/kg/min via INTRAVENOUS

## 2016-10-14 MED ORDER — CARVEDILOL 12.5 MG PO TABS
12.5000 mg | ORAL_TABLET | Freq: Two times a day (BID) | ORAL | Status: DC
  Administered 2016-10-14 – 2016-10-15 (×3): 12.5 mg via ORAL
  Filled 2016-10-14 (×5): qty 1

## 2016-10-14 MED ORDER — ASPIRIN 81 MG PO CHEW
81.0000 mg | CHEWABLE_TABLET | Freq: Every day | ORAL | Status: DC
  Administered 2016-10-14 – 2016-10-15 (×2): 81 mg via ORAL
  Filled 2016-10-14 (×2): qty 1

## 2016-10-14 MED ORDER — HEPARIN (PORCINE) IN NACL 2-0.9 UNIT/ML-% IJ SOLN
INTRAMUSCULAR | Status: AC | PRN
  Administered 2016-10-14: 500 mL

## 2016-10-14 MED ORDER — LIDOCAINE HCL (PF) 1 % IJ SOLN
INTRAMUSCULAR | Status: DC | PRN
  Administered 2016-10-14: 35 mL via INTRADERMAL

## 2016-10-14 MED ORDER — HYDROCODONE-ACETAMINOPHEN 5-325 MG PO TABS
1.0000 | ORAL_TABLET | ORAL | Status: DC | PRN
  Administered 2016-10-14 (×2): 2 via ORAL
  Filled 2016-10-14 (×2): qty 2

## 2016-10-14 SURGICAL SUPPLY — 7 items
CABLE SURGICAL S-101-97-12 (CABLE) ×2 IMPLANT
HEMOSTAT SURGICEL 2X4 FIBR (HEMOSTASIS) ×2 IMPLANT
ICD VIGILANT VR D232 (Pacemaker) ×2 IMPLANT
LEAD RELIANCE G DF4 0293 (Lead) ×2 IMPLANT
PAD DEFIB LIFELINK (PAD) ×2 IMPLANT
SHEATH CLASSIC 9.5F (SHEATH) ×2 IMPLANT
TRAY PACEMAKER INSERTION (PACKS) ×2 IMPLANT

## 2016-10-14 NOTE — Discharge Instructions (Signed)
° ° °  Supplemental Discharge Instructions for  °Pacemaker/Defibrillator Patients ° °Activity °No heavy lifting or vigorous activity with your left/right arm for 6 to 8 weeks.  Do not raise your left/right arm above your head for one week.  Gradually raise your affected arm as drawn below. ° °        ° °__          10/18/16                    10/19/16                    10/20/16                     10/21/16 ° °NO DRIVING for   1 week  ; you may begin driving on  10/21/16   . ° °WOUND CARE °- Keep the wound area clean and dry.  °- No bandage is needed on the site.  DO  NOT apply any creams, oils, or ointments to the wound area. °- If you notice any drainage or discharge from the wound, any swelling or bruising at the site, or you develop a fever > 101? F after you are discharged home, call the office at once. ° °Special Instructions °- You are still able to use cellular telephones; use the ear opposite the side where you have your pacemaker/defibrillator.  Avoid carrying your cellular phone near your device. °- When traveling through airports, show security personnel your identification card to avoid being screened in the metal detectors.  Ask the security personnel to use the hand wand. °- Avoid arc welding equipment, MRI testing (magnetic resonance imaging), TENS units (transcutaneous nerve stimulators).  Call the office for questions about other devices. °- Avoid electrical appliances that are in poor condition or are not properly grounded. °- Microwave ovens are safe to be near or to operate. ° °Additional information for defibrillator patients should your device go off: °- If your device goes off ONCE and you feel fine afterward, notify the device clinic nurses. °- If your device goes off ONCE and you do not feel well afterward, call 911. °- If your device goes off TWICE, call 911. °- If your device goes off THREE times in one day, call 911. ° °DO NOT DRIVE YOURSELF OR A FAMILY MEMBER °WITH A DEFIBRILLATOR TO THE  HOSPITAL--CALL 911. ° °

## 2016-10-14 NOTE — Discharge Summary (Signed)
ELECTROPHYSIOLOGY PROCEDURE DISCHARGE SUMMARY    Patient ID: Troy Rush,  MRN: [id number], DOB/AGE: 1970/08/15 46 y.o.  Admit date: 10/14/2016 Discharge date: 10/15/2016  Primary Care Physician: Dorena Dew, FNP Primary Cardiologist: Aundra Dubin Electrophysiologist: Caryl Comes  Primary Discharge Diagnosis:  NICM s/p ICD implant this admission  Secondary Discharge Diagnosis:  1.  Deafness 2.  HTN 3.  Diabetes 4.  Depression  Allergies  Allergen Reactions  . Lisinopril Cough     Procedures This Admission:  1.  Implantation of a BSX single chamber ICD on 10/14/16 by Dr Caryl Comes.  See op note for full details. There were no immediate post procedure complications. 2.  CXR on 10/15/16 demonstrated no pneumothorax status post device implantation.   Brief HPI: Troy Rush is a 46 y.o. male was referred to electrophysiology in the outpatient setting for consideration of ICD implantation.  Past medical history includes NICM, CHF, deafness.  The patient has persistent LV dysfunction despite guideline directed therapy.  Risks, benefits, and alternatives to ICD implantation were reviewed with the patient who wished to proceed.   Hospital Course:  The patient was admitted and underwent implantation of a BSX single chamber ICD with details as outlined above. He was monitored on telemetry overnight which demonstrated sinus rhythm.  Left chest was without hematoma or ecchymosis.  The device was interrogated and found to be functioning normally.  CXR was obtained and demonstrated no pneumothorax status post device implantation.  Wound care, arm mobility, and restrictions were reviewed with the patient.  The patient was examined and considered stable for discharge to home.   The patient's discharge medications include an ARB (Entresto) and beta blocker (Coreg).   Physical Exam: Vitals:   10/14/16 1349 10/14/16 1408 10/14/16 1909 10/15/16 0214  BP:  (!) 173/95 137/82 (!) 142/91  Pulse:  95 91 91    Resp:  _0 Temp: 97.7 F (36.5 C) 98.1 F (36.7 C) 98.2 F (36.8 C) (!) 97.4 F (36.3 C)  TempSrc:  Oral Oral Oral  SpO2:  100% 100% 100%  Weight:  231 lb 7.7 oz (105 kg)  237 lb 7 oz (107.7 kg)  Height:  _1  (1.753 m)      GEN- The patient is well appearing, alert and oriented x 3 today.   HEENT: normocephalic, atraumatic; sclera clear, conjunctiva pink; hearing intact; oropharynx clear; neck supple  Lungs- Clear to ausculation bilaterally, normal work of breathing.  No wheezes, rales, rhonchi. eft chest was without hematoma or ecchymosis. Heart- Regular rate and rhythm, no murmurs, rubs or gallops  GI- soft, non-tender, non-distended, bowel sounds present  Extremities- no clubbing, cyanosis, or edema  MS- no significant deformity or atrophy Skin- warm and dry, no rash or lesion, left chest without hematoma/ecchymosis Psych- euthymic mood, full affect Neuro- strength and sensation are intact   Labs:   Lab Results  Component Value Date   WBC 3.6 10/05/2016   HGB 11.6 (L) 10/05/2016   HCT 36.7 (L) 10/05/2016   MCV 89 10/05/2016   PLT 227 10/05/2016   No results for input(s): NA, K, CL, CO2, BUN, CREATININE, CALCIUM, PROT, BILITOT, ALKPHOS, ALT, AST, GLUCOSE in the last 168 hours.  Invalid input(s): LABALBU  Discharge Medications:  Allergies as of 10/15/2016      Reactions   Lisinopril Cough      Medication List    TAKE these medications   ACCU-CHEK AVIVA PLUS w/Device Kit 1 each by Does not apply route 4 (  four) times daily -  before meals and at bedtime.   aspirin 81 MG chewable tablet Chew 81 mg by mouth daily.   atorvastatin 80 MG tablet Commonly known as:  LIPITOR Take 1 tablet (80 mg total) by mouth daily.   blood glucose meter kit and supplies Kit Dispense based on patient and insurance preference. Use up to four times daily as directed. (FOR ICD-9 250.00, 250.01).   carvedilol 12.5 MG tablet Commonly known as:  COREG Take 1 tablet (12.5 mg  total) by mouth 2 (two) times daily.   citalopram 40 MG tablet Commonly known as:  CELEXA Take 1 tablet (40 mg total) by mouth daily.   digoxin 0.125 MG tablet Commonly known as:  LANOXIN Take 1 tablet (0.125 mg total) by mouth daily.   furosemide 20 MG tablet Commonly known as:  LASIX Take 1 tablet (20 mg total) by mouth daily.   glucose blood test strip Commonly known as:  ACCU-CHEK AVIVA PLUS Check blood three times per day and at bedtime   hydrALAZINE 50 MG tablet Commonly known as:  APRESOLINE Take 1 tablet (50 mg total) by mouth 3 (three) times daily.   Insulin Glargine 100 UNIT/ML Solostar Pen Commonly known as:  LANTUS SOLOSTAR Inject 20 Units into the skin daily at 10 pm. What changed:  how much to take   INSULIN SYRINGE .5CC/29G 29G X 1/2" 0.5 ML Misc Commonly known as:  B-D INS SYR ULTRAFINE .5CC/29G 1 each by Does not apply route 3 (three) times daily before meals.   isosorbide mononitrate 30 MG 24 hr tablet Commonly known as:  IMDUR Take 1 tablet (30 mg total) by mouth daily.   ivabradine 5 MG Tabs tablet Commonly known as:  CORLANOR Take 1 tablet (5 mg total) by mouth 2 (two) times daily with a meal.   metFORMIN 1000 MG tablet Commonly known as:  GLUCOPHAGE Take 1 tablet (1,000 mg total) by mouth 2 (two) times daily with a meal.   promethazine 25 MG tablet Commonly known as:  PHENERGAN Take 1 tablet (25 mg total) by mouth every 6 (six) hours as needed for nausea or vomiting.   sacubitril-valsartan 24-26 MG Commonly known as:  ENTRESTO Take 1 tablet by mouth 2 (two) times daily.   sitaGLIPtin 50 MG tablet Commonly known as:  JANUVIA Take 1 tablet (50 mg total) by mouth daily.   spironolactone 25 MG tablet Commonly known as:  ALDACTONE Take 1 tablet (25 mg total) by mouth daily.       Disposition:  Discharge Instructions    Call MD for:  persistant dizziness or light-headedness    Complete by:  As directed    Call MD for:  redness,  tenderness, or signs of infection (pain, swelling, redness, odor or green/yellow discharge around incision site)    Complete by:  As directed    Call MD for:  temperature >100.4    Complete by:  As directed    Diet - low sodium heart healthy    Complete by:  As directed    Increase activity slowly    Complete by:  As directed      Follow-up Information    [place] [facility] Follow up on 10/24/2016.   Specialty:  Cardiology Why:  at [facility] information: [address], [address] [place]       Deboraha Sprang, MD Follow up on 01/24/2017.   Specialty:  Cardiology Why:  at [facility] information: [place] [place] [address] [phone]           Duration of Discharge Encounter: Greater than 30 minutes including physician time.  Jarrett Soho, [facility] 10/15/2016 7:59 AM  I have seen and examined this patient with Leanor Kail.  Agree with above, note added to reflect my findings.  On exam, RRR, no murmurs, lungs clear. Had single chamber ICD implanted for nonischemic cardiomyopathy. CXR and interrogation without issues. Plan for discharge today with follow up in device clinic in 10 days.    Miamarie Moll M. Sugey Trevathan MD 10/15/2016 8:07 AM

## 2016-10-14 NOTE — Progress Notes (Signed)
Paged Dorene Ar- on call cardiology. Patient c/o pain at incision site and left shoulder. 7/10. Interpreter utilized.

## 2016-10-14 NOTE — Interval H&P Note (Signed)
ICD Criteria  Current LVEF:20%. Within 12 months prior to implant: Yes   Heart failure history: Yes, Class II  Cardiomyopathy history: Yes, Non-Ischemic Cardiomyopathy.  Atrial Fibrillation/Atrial Flutter: No.  Ventricular tachycardia history: No.  Cardiac arrest history: No.  History of syndromes with risk of sudden death: No.  Previous ICD: No.  Current ICD indication: Primary  PPM indication: No.   Class I or II Bradycardia indication present: No  Beta Blocker therapy for 3 or more months: Yes, prescribed.   Ace Inhibitor/ARB therapy for 3 or more months: Yes, prescribed.   History and Physical Interval Note:  10/14/2016 12:37 PM  Troy Rush  has presented today for surgery, with the diagnosis of chf cardiomyopathy  The various methods of treatment have been discussed with the patient and family. After consideration of risks, benefits and other options for treatment, the patient has consented to  Procedure(s): ICD Implant (N/A) as a surgical intervention .  The patient's history has been reviewed, patient examined, no change in status, stable for surgery.  I have reviewed the patient's chart and labs.  Questions were answered to the patient's satisfaction.     Troy Rush

## 2016-10-14 NOTE — Transfer of Care (Signed)
Immediate Anesthesia Transfer of Care Note  Patient: Troy Rush  Procedure(s) Performed: Procedure(s): ICD Implant (N/A)  Patient Location: PACU and Cath Lab  Anesthesia Type:MAC  Level of Consciousness: awake, drowsy and patient cooperative  Airway & Oxygen Therapy: Patient Spontanous Breathing and Patient connected to nasal cannula oxygen  Post-op Assessment: Report given to RN and Post -op Vital signs reviewed and stable  Post vital signs: Reviewed and stable  Last Vitals:  Vitals:   10/14/16 0804  BP: (!) 192/103  Pulse: 86  Resp: 18  Temp: (!) 36.3 C    Last Pain:  Vitals:   10/14/16 0804  TempSrc: Oral         Complications: No apparent anesthesia complications

## 2016-10-14 NOTE — Progress Notes (Signed)
Pt is deaf and does not have family or  anyone with him.  Offered pt the virtual interpreter however pt refuses and states he wants a live interpreter.  Pt is refusing care until he has interpreter here. Pts procedure time had been moved up and interpreter was unaware and states they can not be here until after 0900. Cath lab ie Brian,RTR made aware.

## 2016-10-14 NOTE — H&P (View-Only) (Signed)
ELECTROPHYSIOLOGY CONSULT NOTE  Patient ID: Troy Rush, MRN: [id number], DOB/AGE: Jul 02, 1970 46 y.o. Admit date: (Not on file) Date of Consult: 09/15/2016  Primary Physician: Dorena Dew, [place] Primary Cardiologist: Camie Patience is being seen today for the evaluation of ICD implantation at the request of  Dr Zada Finders.   HPI Troy Rush is a 46 y.o. male  Referred for consideration of an ICD.  His a history of nonischemic cardiomyopathy. Echocardiogram 10/16 demonstrated EF 15-20%. Catheterization no significant coronary disease. DATE TEST    10/16 Echo   EF 15-20 %   10/16 Cath  no obstructive CAD    5/18 Echo EF 20-25%    He is marked limitations in activity with DOE < 1 flt of stairs   No PND or orthopnea.  + edema  No CP or palpitations or syncope  He is deaf but this is acquired  Date Cr K Mg  08/29/16  0.87 4.0     09/08/16  2.01 4.5     The notes were reviewed and an effort was made to contact the pt, but not returned   He has sleep disordered breathing with day time somnolence  Past Medical History:  Diagnosis Date  . Bronchitis   . CHF (congestive heart failure) ([place])   . Diabetes mellitus without complication ([place])   . Dyslipidemia   . Hypertension   . NICM (nonischemic cardiomyopathy) ([place]) 09/15/2016  . Pancreatitis       Surgical History:  Past Surgical History:  Procedure Laterality Date  . BACK SURGERY    . CARDIAC CATHETERIZATION    . WRIST SURGERY       Home Meds: Prior to Admission medications   Medication Sig Start Date End Date Taking? Authorizing Provider  aspirin 81 MG chewable tablet Chew 81 mg by mouth daily.   Yes [provider]  atorvastatin (LIPITOR) 80 MG tablet Take 1 tablet (80 mg total) by mouth daily. 06/27/16  Yes Larey Dresser, MD  blood glucose meter kit and supplies KIT Dispense based on patient and insurance preference. Use up to four times daily as directed. (FOR ICD-9 250.00, 250.01). 03/21/16  Yes  Micheline Chapman, NP  carvedilol (COREG) 12.5 MG tablet Take 1 tablet (12.5 mg total) by mouth 2 (two) times daily. 06/27/16 09/25/16 Yes Larey Dresser, MD  citalopram (CELEXA) 40 MG tablet Take 1 tablet (40 mg total) by mouth daily. 03/21/16  Yes Micheline Chapman, NP  digoxin (LANOXIN) 0.125 MG tablet Take 1 tablet (0.125 mg total) by mouth daily. 06/27/16  Yes Larey Dresser, MD  furosemide (LASIX) 40 MG tablet Take 1 tablet (40 mg total) by mouth daily. Take an additional 40 mg (1 tablet) for weight gain >3 lb in 1 day or >5 lb in 1 week. 06/27/16  Yes Larey Dresser, MD  Insulin Glargine (LANTUS SOLOSTAR) 100 UNIT/ML Solostar Pen Inject 20 Units into the skin daily at 10 pm. 04/18/16  Yes Dorena Dew, FNP  INSULIN SYRINGE .5CC/29G (B-D INS SYR ULTRAFINE .5CC/29G) 29G X 1/2" 0.5 ML MISC 1 each by Does not apply route 3 (three) times daily before meals. 04/18/16  Yes Dorena Dew, FNP  isosorbide mononitrate (IMDUR) 30 MG 24 hr tablet Take 1 tablet (30 mg total) by mouth daily. 06/27/16 09/25/16 Yes Larey Dresser, MD  ivabradine (CORLANOR) 5 MG TABS tablet Take 1 tablet (5 mg total) by mouth 2 (two) times daily  with a meal. 06/27/16  Yes McLean, Dalton S, MD  metFORMIN (GLUCOPHAGE) 1000 MG tablet Take 1 tablet (1,000 mg total) by mouth 2 (two) times daily with a meal. 03/28/16  Yes Bernhardt, Linda C, NP  Patiromer Sorbitex Calcium (VELTASSA) 16.8 g PACK Take 16.8 g by mouth daily.    Yes [provider]  promethazine (PHENERGAN) 25 MG tablet Take 1 tablet (25 mg total) by mouth every 6 (six) hours as needed for nausea or vomiting. 02/02/16  Yes Mohr, Tyler, PA-C  sacubitril-valsartan (ENTRESTO) 49-51 MG Take 1 tablet by mouth 2 (two) times daily. 08/29/16  Yes McLean, Dalton S, MD  sitaGLIPtin (JANUVIA) 50 MG tablet Take 1 tablet (50 mg total) by mouth daily. 04/18/16  Yes Hollis, Lachina M, FNP  spironolactone (ALDACTONE) 25 MG tablet Take 1 tablet (25 mg total) by mouth daily.  06/27/16 10/25/16 Yes McLean, Dalton S, MD  hydrALAZINE (APRESOLINE) 25 MG tablet Take 1 tablet (25 mg total) by mouth 3 (three) times daily. 05/30/16 08/28/16  McLean, Dalton S, MD    Allergies:  Allergies  Allergen Reactions  . Lisinopril Cough    Social History   Social History  . Marital status: Legally Separated    Spouse name: N/A  . Number of children: N/A  . Years of education: N/A   Occupational History  . Not on file.   Social History Main Topics  . Smoking status: Never Smoker  . Smokeless tobacco: Never Used  . Alcohol use Yes     Comment: rare  . Drug use: No  . Sexual activity: Not on file   Other Topics Concern  . Not on file   Social History Narrative  . No narrative on file     Family History  Problem Relation Age of Onset  . Hypertension Mother      ROS:  Please see the history of present illness.     All other systems reviewed and negative.    Physical Exam  Blood pressure (!) 162/110, pulse 97, height 5' 9" (1.753 m), weight 222 lb (100.7 kg), SpO2 98 %. General: Well developed, well nourished male in no acute distress. Head: Normocephalic, atraumatic, sclera non-icteric, no xanthomas, nares are without discharge. EENT: normal  Lymph Nodes:  none Neck: Negative for carotid bruits. JVD not elevated. Back:without scoliosis kyphosis Lungs: Clear bilaterally to auscultation without wheezes, rales, or rhonchi. Breathing is unlabored. Heart: RRR with S1 S2. No murmur . No rubs, or gallops appreciated. Abdomen: Soft, non-tender, non-distended with normoactive bowel sounds. No hepatomegaly. No rebound/guarding. No obvious abdominal masses. Msk:  Strength and tone appear normal for age. Extremities: No clubbing or cyanosis. 1+  edema.  Distal pedal pulses are 2+ and equal bilaterally. Skin: Warm and Dry Neuro: Alert and oriented X 3. CN III-XII intact Grossly normal sensory and motor function . Psych:  Responds to questions appropriately with a normal  affect.      Labs: Cardiac Enzymes No results for input(s): CKTOTAL, CKMB, TROPONINI in the last 72 hours. CBC Lab Results  Component Value Date   WBC 4.0 03/21/2016   HGB 12.5 (L) 03/21/2016   HCT 41.6 03/21/2016   MCV 88.9 03/21/2016   PLT 199 03/21/2016   PROTIME: No results for input(s): LABPROT, INR in the last 72 hours. Chemistry No results for input(s): NA, K, CL, CO2, BUN, CREATININE, CALCIUM, PROT, BILITOT, ALKPHOS, ALT, AST, GLUCOSE in the last 168 hours.  Invalid input(s): LABALBU Lipids Lab Results  Component Value   Date   CHOL 233 (H) 03/21/2016   HDL 55 03/21/2016   LDLCALC 110 (H) 03/21/2016   TRIG 339 (H) 03/21/2016   BNP No results found for: PROBNP Thyroid Function Tests: No results for input(s): TSH, T4TOTAL, T3FREE, THYROIDAB in the last 72 hours.  Invalid input(s): FREET3 Miscellaneous No results found for: DDIMER  Radiology/Studies:  No results found.  EKG:  Sinus @ 84 20/09/41   Assessment and Plan:  Nonischemic cardiomyopathy  Congestive heart failure-chronic-systolic IIb-IIIa  Hypertension-poorly controlled  Sleep disordered breathing question sleep apnea  Deafness-noncongenital  Hyperlipidemia  Renal failure acute chronic    The patient has persistent left ventricular dysfunction and ongoing congestive heart failure. It is appropriate that we considered for ICD for primary prevention particularly in light of the [place] trial  we have reviewed risks and benefits and he is agreeable to proceeding.  He is well medicated on guideline directed therapy, however, his blood pressure remains high. We will increase his hydralazine from 25-every 8--50 every 12. We will also increase his Entresto from 47/53--97/103.  I am struck by the rapid change his creatinine earlier this month. We will need to repeat this prior to instituting the second change.  His poorly controlled blood pressure may be aggravated by what sounds like sleep apnea.  Is not clear but he may have had a sleep study years ago. It should be repeated.   Given his deafness, and the need to secure his arms for ICD implantation, I have told him that undertaking general anesthesia was able to avoid the inability to communicate             Virl Axe

## 2016-10-14 NOTE — Anesthesia Preprocedure Evaluation (Signed)
Anesthesia Evaluation  Patient identified by MRN, date of birth, ID band Patient awake    Reviewed: Allergy & Precautions, H&P , NPO status , Patient's Chart, lab work & pertinent test results  Airway Mallampati: II   Neck ROM: full    Dental   Pulmonary neg pulmonary ROS,    breath sounds clear to auscultation       Cardiovascular hypertension, +CHF   Rhythm:regular Rate:Normal  EF 25-30%.    Neuro/Psych PSYCHIATRIC DISORDERS Depression    GI/Hepatic   Endo/Other  diabetes, Type 2  Renal/GU      Musculoskeletal   Abdominal   Peds  Hematology   Anesthesia Other Findings   Reproductive/Obstetrics                             Anesthesia Physical Anesthesia Plan  ASA: III  Anesthesia Plan: MAC   Post-op Pain Management:    Induction: Intravenous  PONV Risk Score and Plan: 1 and Ondansetron and Dexamethasone  Airway Management Planned: Simple Face Mask  Additional Equipment:   Intra-op Plan:   Post-operative Plan:   Informed Consent: I have reviewed the patients History and Physical, chart, labs and discussed the procedure including the risks, benefits and alternatives for the proposed anesthesia with the patient or authorized representative who has indicated his/her understanding and acceptance.     Plan Discussed with: CRNA, Anesthesiologist and Surgeon  Anesthesia Plan Comments:         Anesthesia Quick Evaluation

## 2016-10-14 NOTE — Interval H&P Note (Signed)
History and Physical Interval Note:  10/14/2016 11:57 AM  Troy Rush  has presented today for surgery, with the diagnosis of chf cardiomyopathy  The various methods of treatment have been discussed with the patient and family. After consideration of risks, benefits and other options for treatment, the patient has consented to  Procedure(s): ICD Implant (N/A) as a surgical intervention .  The patient's history has been reviewed, patient examined, no change in status, stable for surgery.  I have reviewed the patient's chart and labs.  Questions were answered to the patient's satisfaction.     Virl Axe

## 2016-10-15 ENCOUNTER — Ambulatory Visit (HOSPITAL_COMMUNITY): Payer: Medicare Other

## 2016-10-15 DIAGNOSIS — Z7982 Long term (current) use of aspirin: Secondary | ICD-10-CM | POA: Diagnosis not present

## 2016-10-15 DIAGNOSIS — N182 Chronic kidney disease, stage 2 (mild): Secondary | ICD-10-CM | POA: Diagnosis not present

## 2016-10-15 DIAGNOSIS — I428 Other cardiomyopathies: Secondary | ICD-10-CM | POA: Diagnosis not present

## 2016-10-15 DIAGNOSIS — N179 Acute kidney failure, unspecified: Secondary | ICD-10-CM | POA: Diagnosis not present

## 2016-10-15 DIAGNOSIS — I5022 Chronic systolic (congestive) heart failure: Secondary | ICD-10-CM | POA: Diagnosis not present

## 2016-10-15 DIAGNOSIS — Z794 Long term (current) use of insulin: Secondary | ICD-10-CM | POA: Diagnosis not present

## 2016-10-15 DIAGNOSIS — H9190 Unspecified hearing loss, unspecified ear: Secondary | ICD-10-CM | POA: Diagnosis not present

## 2016-10-15 DIAGNOSIS — E785 Hyperlipidemia, unspecified: Secondary | ICD-10-CM | POA: Diagnosis not present

## 2016-10-15 DIAGNOSIS — I130 Hypertensive heart and chronic kidney disease with heart failure and stage 1 through stage 4 chronic kidney disease, or unspecified chronic kidney disease: Secondary | ICD-10-CM | POA: Diagnosis not present

## 2016-10-15 DIAGNOSIS — Z006 Encounter for examination for normal comparison and control in clinical research program: Secondary | ICD-10-CM | POA: Diagnosis not present

## 2016-10-15 LAB — GLUCOSE, CAPILLARY: Glucose-Capillary: 168 mg/dL — ABNORMAL HIGH (ref 65–99)

## 2016-10-15 MED ORDER — ONDANSETRON HCL 4 MG PO TABS
8.0000 mg | ORAL_TABLET | Freq: Four times a day (QID) | ORAL | Status: DC | PRN
  Administered 2016-10-15: 07:00:00 8 mg via ORAL
  Filled 2016-10-15: qty 2

## 2016-10-15 MED ORDER — CARVEDILOL 12.5 MG PO TABS: 12.5000 mg | ORAL_TABLET | Freq: Two times a day (BID) | ORAL | 3 refills | Status: DC

## 2016-10-15 MED ORDER — ISOSORBIDE MONONITRATE ER 30 MG PO TB24: 30.0000 mg | ORAL_TABLET | Freq: Every day | ORAL | 3 refills | Status: DC

## 2016-10-15 NOTE — Anesthesia Postprocedure Evaluation (Signed)
Anesthesia Post Note  Patient: Troy Rush  Procedure(Rush) Performed: Procedure(Rush) (LRB): ICD Implant (N/A)     Patient location during evaluation: PACU Anesthesia Type: MAC Level of consciousness: awake and alert Pain management: pain level controlled Vital Signs Assessment: post-procedure vital signs reviewed and stable Respiratory status: spontaneous breathing, nonlabored ventilation, respiratory function stable and patient connected to nasal cannula oxygen Cardiovascular status: stable and blood pressure returned to baseline Anesthetic complications: no    Last Vitals:  Vitals:   10/15/16 0214 10/15/16 0750  BP: (!) 142/91 (!) 167/99  Pulse: 91 69  Resp: 15 12  Temp: (!) 36.3 C 36.5 C    Last Pain:  Vitals:   10/15/16 0750  TempSrc: Oral  PainSc:    Pain Goal:                 Troy Rush

## 2016-10-15 NOTE — Progress Notes (Signed)
Went over all of discharge instructions with patient using the sign interpretor.  All questions answered.

## 2016-10-17 ENCOUNTER — Other Ambulatory Visit: Payer: Self-pay

## 2016-10-17 DIAGNOSIS — Z794 Long term (current) use of insulin: Principal | ICD-10-CM

## 2016-10-17 DIAGNOSIS — E118 Type 2 diabetes mellitus with unspecified complications: Secondary | ICD-10-CM

## 2016-10-17 MED ORDER — ACCU-CHEK SOFTCLIX LANCET DEV MISC: 0 refills | Status: DC

## 2016-10-17 MED ORDER — GLUCOSE BLOOD VI STRP: ORAL_STRIP | 12 refills | Status: DC

## 2016-10-17 MED ORDER — ACCU-CHEK AVIVA PLUS W/DEVICE KIT: 1.0000 | PACK | Freq: Three times a day (TID) | 0 refills | Status: DC

## 2016-10-17 NOTE — Telephone Encounter (Signed)
Received a refill request for meter, test strips, and lancets. This has been filled.

## 2016-10-24 ENCOUNTER — Encounter: Payer: Self-pay | Admitting: *Deleted

## 2016-10-24 ENCOUNTER — Ambulatory Visit (INDEPENDENT_AMBULATORY_CARE_PROVIDER_SITE_OTHER): Payer: Medicare Other | Admitting: *Deleted

## 2016-10-24 DIAGNOSIS — I5022 Chronic systolic (congestive) heart failure: Secondary | ICD-10-CM | POA: Diagnosis not present

## 2016-10-24 DIAGNOSIS — I428 Other cardiomyopathies: Secondary | ICD-10-CM

## 2016-10-24 LAB — CUP PACEART INCLINIC DEVICE CHECK
Brady Statistic RV Percent Paced: 0 %
HighPow Impedance: 52 Ohm
Implantable Lead Implant Date: 20180706
Implantable Lead Location: 753860
Implantable Lead Model: 293
Implantable Lead Serial Number: 432676
Lead Channel Impedance Value: 468 Ohm
Lead Channel Pacing Threshold Amplitude: 0.7 V
Lead Channel Pacing Threshold Pulse Width: 0.4 ms
Lead Channel Setting Sensing Sensitivity: 0.5 mV
MDC IDC MSMT LEADCHNL RV SENSING INTR AMPL: 23.2 mV
MDC IDC PG IMPLANT DT: 20180706
MDC IDC PG SERIAL: 236570
MDC IDC SESS DTM: 20180716040000
MDC IDC SET LEADCHNL RV PACING AMPLITUDE: 3.5 V
MDC IDC SET LEADCHNL RV PACING PULSEWIDTH: 0.4 ms

## 2016-10-24 NOTE — Progress Notes (Signed)
Wound check appointment. Dermabond removed. Wound without redness or edema. Medial incision edges unapproximated on left chest incision. 4 steri-strips applied, patient will return to device clinic next week for wound re-check. Normal device function. Thresholds, sensing, and impedances consistent with implant measurements. Device programmed at 3.5V for extra safety margin until 3 month visit. Histogram distribution appropriate for patient and level of activity. No ventricular arrhythmias noted. Sleep incline calibrated today for [employer]. Patient educated about wound care, arm mobility, lifting restrictions, shock plan. ROV with SK 01/24/17.

## 2016-11-01 ENCOUNTER — Ambulatory Visit: Payer: Medicare Other

## 2016-11-02 ENCOUNTER — Encounter: Payer: Self-pay | Admitting: Physician Assistant

## 2016-11-14 ENCOUNTER — Encounter (HOSPITAL_COMMUNITY): Payer: Medicare Other | Admitting: Cardiology

## 2016-11-19 ENCOUNTER — Encounter: Payer: Self-pay | Admitting: Internal Medicine

## 2016-11-19 ENCOUNTER — Encounter (HOSPITAL_COMMUNITY): Payer: Self-pay | Admitting: Cardiology

## 2016-11-22 ENCOUNTER — Telehealth: Payer: Self-pay | Admitting: Internal Medicine

## 2016-11-22 ENCOUNTER — Encounter (HOSPITAL_COMMUNITY): Payer: Self-pay | Admitting: *Deleted

## 2016-11-22 NOTE — Telephone Encounter (Signed)
New message   Pt calling heather back

## 2016-11-22 NOTE — Telephone Encounter (Signed)
No answer- will respond to patient through his MyChart from 11/19/16.

## 2016-12-01 ENCOUNTER — Encounter: Payer: Self-pay | Admitting: *Deleted

## 2016-12-01 ENCOUNTER — Ambulatory Visit (INDEPENDENT_AMBULATORY_CARE_PROVIDER_SITE_OTHER): Payer: Self-pay | Admitting: *Deleted

## 2016-12-01 DIAGNOSIS — I428 Other cardiomyopathies: Secondary | ICD-10-CM

## 2016-12-01 NOTE — Progress Notes (Signed)
Wound re-check appointment. Steri-strips removed. Wound without redness, edema, or drainage. Incision edges approximated, wound well healed. No fever or chills. Patient education done. ROV with SK 01/24/17.

## 2016-12-06 ENCOUNTER — Encounter: Payer: Self-pay | Admitting: Family Medicine

## 2016-12-07 ENCOUNTER — Ambulatory Visit: Payer: Medicare Other | Admitting: Family Medicine

## 2016-12-09 ENCOUNTER — Encounter (HOSPITAL_COMMUNITY): Payer: Self-pay | Admitting: Emergency Medicine

## 2016-12-09 ENCOUNTER — Emergency Department (HOSPITAL_COMMUNITY)
Admission: EM | Admit: 2016-12-09 | Discharge: 2016-12-09 | Disposition: A | Discharge status: Home / Self Care | Payer: Medicare Other | Attending: Emergency Medicine | Admitting: Emergency Medicine

## 2016-12-09 DIAGNOSIS — I5022 Chronic systolic (congestive) heart failure: Secondary | ICD-10-CM | POA: Insufficient documentation

## 2016-12-09 DIAGNOSIS — I110 Hypertensive heart disease with heart failure: Secondary | ICD-10-CM | POA: Diagnosis not present

## 2016-12-09 DIAGNOSIS — Z7982 Long term (current) use of aspirin: Secondary | ICD-10-CM | POA: Insufficient documentation

## 2016-12-09 DIAGNOSIS — Z79899 Other long term (current) drug therapy: Secondary | ICD-10-CM | POA: Insufficient documentation

## 2016-12-09 DIAGNOSIS — E119 Type 2 diabetes mellitus without complications: Secondary | ICD-10-CM | POA: Diagnosis not present

## 2016-12-09 DIAGNOSIS — R112 Nausea with vomiting, unspecified: Secondary | ICD-10-CM | POA: Insufficient documentation

## 2016-12-09 DIAGNOSIS — Z794 Long term (current) use of insulin: Secondary | ICD-10-CM | POA: Insufficient documentation

## 2016-12-09 DIAGNOSIS — I10 Essential (primary) hypertension: Secondary | ICD-10-CM

## 2016-12-09 LAB — COMPREHENSIVE METABOLIC PANEL
ALBUMIN: 4 g/dL (ref 3.5–5.0)
ALT: 34 U/L (ref 17–63)
ANION GAP: 8 (ref 5–15)
AST: 45 U/L — AB (ref 15–41)
Alkaline Phosphatase: 49 U/L (ref 38–126)
BUN: 9 mg/dL (ref 6–20)
CALCIUM: 8.9 mg/dL (ref 8.9–10.3)
CHLORIDE: 105 mmol/L (ref 101–111)
CO2: 28 mmol/L (ref 22–32)
Creatinine, Ser: 1.06 mg/dL (ref 0.61–1.24)
GFR calc Af Amer: >60 mL/min (ref >60)
Glucose, Bld: 236 mg/dL — ABNORMAL HIGH (ref 65–99)
POTASSIUM: 4.6 mmol/L (ref 3.5–5.1)
Sodium: 141 mmol/L (ref 135–145)
TOTAL PROTEIN: 8.1 g/dL (ref 6.5–8.1)
Total Bilirubin: 0.9 mg/dL (ref 0.3–1.2)

## 2016-12-09 LAB — CBC
HCT: 36.2 % — ABNORMAL LOW (ref 39.0–52.0)
Hemoglobin: 11.3 g/dL — ABNORMAL LOW (ref 13.0–17.0)
MCH: 27.2 pg (ref 26.0–34.0)
MCHC: 31.2 g/dL (ref 30.0–36.0)
MCV: 87.2 fL (ref 78.0–100.0)
Platelets: 219 10*3/uL (ref 150–400)
RBC: 4.15 MIL/uL — ABNORMAL LOW (ref 4.22–5.81)
RDW: 14.1 % (ref 11.5–15.5)
WBC: 4.2 10*3/uL (ref 4.0–10.5)

## 2016-12-09 LAB — CBG MONITORING, ED: GLUCOSE-CAPILLARY: 224 mg/dL — AB (ref 65–99)

## 2016-12-09 LAB — LIPASE, BLOOD: LIPASE: 36 U/L (ref 11–51)

## 2016-12-09 MED ORDER — HYDRALAZINE HCL 50 MG PO TABS
50.0000 mg | ORAL_TABLET | Freq: Once | ORAL | Status: AC
  Administered 2016-12-09: 50 mg via ORAL
  Filled 2016-12-09: qty 1

## 2016-12-09 MED ORDER — SACUBITRIL-VALSARTAN 24-26 MG PO TABS
1.0000 | ORAL_TABLET | Freq: Once | ORAL | Status: AC
  Administered 2016-12-09: 1 via ORAL
  Filled 2016-12-09: qty 1

## 2016-12-09 MED ORDER — SODIUM CHLORIDE 0.9 % IV BOLUS (SEPSIS)
1000.0000 mL | Freq: Once | INTRAVENOUS | Status: AC
  Administered 2016-12-09: 1000 mL via INTRAVENOUS

## 2016-12-09 MED ORDER — ONDANSETRON HCL 4 MG/2ML IJ SOLN
4.0000 mg | Freq: Once | INTRAMUSCULAR | Status: AC
  Administered 2016-12-09: 4 mg via INTRAVENOUS
  Filled 2016-12-09: qty 2

## 2016-12-09 NOTE — Discharge Instructions (Signed)
It was our pleasure to provide your ER care today - we hope that you feel better.  Rest. Drink plenty of fluids.  Your blood pressure is high tonight - take your medications as prescribed, limit salt intake, and follow up with primary care doctor in the next 3-4 days.  Return to ER if worse, new symptoms, persistent vomiting, new or severe abdominal pain, other concern.

## 2016-12-09 NOTE — ED Provider Notes (Addendum)
[facility] DEPT Provider Note   CSN: [id number] Arrival date & time: 12/09/16  1809     History   Chief Complaint Chief Complaint  Patient presents with  . Nausea  . Emesis    HPI Troy Rush is a 46 y.o. male.  Patient c/o nausea and vomiting today, several episodes, sl yellowish, no bloody or bilious emesis noted.  Patient indicates stomach upset, but no constant/focal abd pain. No diarrhea. Had normal bm. Denies known ill contacts or bad food ingestion, works in a school [employer].   No cp or sob. No cough or uri c/o. No severe headaches. No fever.    The history is provided by the patient. A language interpreter was used.  Abdominal Pain   Associated symptoms include vomiting. Pertinent negatives include fever and headaches.    Past Medical History:  Diagnosis Date  . AICD (automatic cardioverter/defibrillator) present 10/14/2016  . Bronchitis   . CHF (congestive heart failure) ([place])   . Depression   . Diabetes mellitus without complication ([place])   . Dyslipidemia   . Hypertension   . NICM (nonischemic cardiomyopathy) ([place]) 09/15/2016  . Pancreatitis     Patient Active Problem List   Diagnosis Date Noted  . Ischemic cardiomyopathy 10/14/2016  . NICM (nonischemic cardiomyopathy) ([place]) 09/15/2016  . Chronic systolic CHF (congestive heart failure) ([place]) 05/30/2016  . HTN (hypertension) 05/30/2016  . Deaf 05/30/2016  . Hyperkalemia 05/30/2016  . Diabetes ([place]) 03/21/2016  . Major depressive disorder, recurrent episode ([place]) 03/21/2016    Past Surgical History:  Procedure Laterality Date  . BACK SURGERY    . CARDIAC CATHETERIZATION    . ICD IMPLANT  10/14/2016  . ICD IMPLANT N/A 10/14/2016   Procedure: ICD Implant;  Surgeon: Deboraha Sprang, MD;  Location: [facility];  Service: Cardiovascular;  Laterality: N/A;  . WRIST SURGERY         Home Medications    Prior to Admission medications   Medication Sig Start Date End Date Taking? Authorizing  Provider  aspirin 81 MG chewable tablet Chew 81 mg by mouth daily.    [provider]  atorvastatin (LIPITOR) 80 MG tablet Take 1 tablet (80 mg total) by mouth daily. 06/27/16   Larey Dresser, MD  blood glucose meter kit and supplies KIT Dispense based on patient and insurance preference. Use up to four times daily as directed. (FOR ICD-9 250.00, 250.01). 03/21/16   Micheline Chapman, NP  Blood Glucose Monitoring Suppl (ACCU-CHEK AVIVA PLUS) w/Device KIT 1 each by Does not apply route 4 (four) times daily -  before meals and at bedtime. 10/17/16   Dorena Dew, FNP  carvedilol (COREG) 12.5 MG tablet Take 1 tablet (12.5 mg total) by mouth 2 (two) times daily. 10/15/16 10/10/17  Leanor Kail, PA  citalopram (CELEXA) 40 MG tablet Take 1 tablet (40 mg total) by mouth daily. 03/21/16   Micheline Chapman, NP  digoxin (LANOXIN) 0.125 MG tablet Take 1 tablet (0.125 mg total) by mouth daily. 06/27/16   Larey Dresser, MD  furosemide (LASIX) 20 MG tablet Take 1 tablet (20 mg total) by mouth daily. 09/19/16   Larey Dresser, MD  glucose blood (ACCU-CHEK AVIVA PLUS) test strip Check blood three times per day and at bedtime 10/17/16   Dorena Dew, FNP  hydrALAZINE (APRESOLINE) 50 MG tablet Take 1 tablet (50 mg total) by mouth 3 (three) times daily. 09/15/16 12/14/16  Deboraha Sprang, MD  Insulin Glargine (LANTUS  SOLOSTAR) 100 UNIT/ML Solostar Pen Inject 20 Units into the skin daily at 10 pm. Patient taking differently: Inject 30 Units into the skin daily at 10 pm.  04/18/16   Dorena Dew, FNP  INSULIN SYRINGE .5CC/29G (B-D INS SYR ULTRAFINE .5CC/29G) 29G X 1/2" 0.5 ML MISC 1 each by Does not apply route 3 (three) times daily before meals. 04/18/16   Dorena Dew, FNP  isosorbide mononitrate (IMDUR) 30 MG 24 hr tablet Take 1 tablet (30 mg total) by mouth daily. 10/15/16 10/10/17  Leanor Kail, PA  ivabradine (CORLANOR) 5 MG TABS tablet Take 1 tablet (5 mg total) by mouth 2 (two) times  daily with a meal. 06/27/16   Larey Dresser, MD  Lancet Devices [facility]) lancets Use as instructed 10/17/16   Dorena Dew, FNP  metFORMIN (GLUCOPHAGE) 1000 MG tablet Take 1 tablet (1,000 mg total) by mouth 2 (two) times daily with a meal. 03/28/16   Micheline Chapman, NP  promethazine (PHENERGAN) 25 MG tablet Take 1 tablet (25 mg total) by mouth every 6 (six) hours as needed for nausea or vomiting. 02/02/16   Shary Decamp, PA-C  sacubitril-valsartan (ENTRESTO) 24-26 MG Take 1 tablet by mouth 2 (two) times daily. 09/19/16   Larey Dresser, MD  sitaGLIPtin (JANUVIA) 50 MG tablet Take 1 tablet (50 mg total) by mouth daily. 09/16/16   Dorena Dew, FNP  spironolactone (ALDACTONE) 25 MG tablet Take 1 tablet (25 mg total) by mouth daily. 06/27/16 10/25/16  Larey Dresser, MD    Family History Family History  Problem Relation Age of Onset  . Hypertension Mother     Social History Social History  Substance Use Topics  . Smoking status: Never Smoker  . Smokeless tobacco: Never Used  . Alcohol use Yes     Comment: rare     Allergies   Lisinopril   Review of Systems Review of Systems  Constitutional: Negative for fever.  HENT: Negative for sore throat.   Eyes: Negative for redness.  Respiratory: Negative for shortness of breath.   Cardiovascular: Negative for chest pain.  Gastrointestinal: Positive for vomiting. Negative for abdominal pain.  Genitourinary: Negative for flank pain.  Musculoskeletal: Negative for back pain.  Skin: Negative for rash.  Neurological: Negative for headaches.  Hematological: Does not bruise/bleed easily.  Psychiatric/Behavioral: Negative for confusion.     Physical Exam Updated Vital Signs BP (!) 174/117 (BP Location: Left Arm)   Pulse 85   Temp 99.1 F (37.3 C) (Oral)   Resp 16   SpO2 100%   Physical Exam  Constitutional: He appears well-developed and well-nourished. No distress.  HENT:  Mouth/Throat: Oropharynx is clear  and moist.  Eyes: Pupils are equal, round, and reactive to light. Conjunctivae are normal.  Neck: Neck supple. No tracheal deviation present.  Cardiovascular: Normal rate, regular rhythm, normal heart sounds and intact distal pulses.  Exam reveals no gallop and no friction rub.   No murmur heard. Pulmonary/Chest: Effort normal and breath sounds normal. No accessory muscle usage. No respiratory distress.  Abdominal: Soft. Bowel sounds are normal. He exhibits no distension and no mass. There is no tenderness. There is no rebound and no guarding. No hernia.  Genitourinary:  Genitourinary Comments: No cva tenderness  Musculoskeletal: He exhibits no edema.  Neurological: He is alert.  Deaf, communicates via sign language interpreter. Moves bil ext purposefully. Steady gait.   Skin: Skin is warm and dry. He is not diaphoretic.  Psychiatric: He  has a normal mood and affect.  Nursing note and vitals reviewed.    ED Treatments / Results  Labs (all labs ordered are listed, but only abnormal results are displayed) Results for orders placed or performed during the hospital encounter of 12/09/16  Lipase, blood  Result Value Ref Range   Lipase 36 11 - 51 U/L  Comprehensive metabolic panel  Result Value Ref Range   Sodium 141 135 - 145 mmol/L   Potassium 4.6 3.5 - 5.1 mmol/L   Chloride 105 101 - 111 mmol/L   CO2 28 22 - 32 mmol/L   Glucose, Bld 236 (H) 65 - 99 mg/dL   BUN 9 6 - 20 mg/dL   Creatinine, Ser 1.06 0.61 - 1.24 mg/dL   Calcium 8.9 8.9 - 10.3 mg/dL   Total Protein 8.1 6.5 - 8.1 g/dL   Albumin 4.0 3.5 - 5.0 g/dL   AST 45 (H) 15 - 41 U/L   ALT 34 17 - 63 U/L   Alkaline Phosphatase 49 38 - 126 U/L   Total Bilirubin 0.9 0.3 - 1.2 mg/dL   GFR calc non Af Amer >60 >60 mL/min   GFR calc Af Amer >60 >60 mL/min   Anion gap 8 5 - 15  CBC  Result Value Ref Range   WBC 4.2 4.0 - 10.5 K/uL   RBC 4.15 (L) 4.22 - 5.81 MIL/uL   Hemoglobin 11.3 (L) 13.0 - 17.0 g/dL   HCT 36.2 (L) 39.0 -  52.0 %   MCV 87.2 78.0 - 100.0 fL   MCH 27.2 26.0 - 34.0 pg   MCHC 31.2 30.0 - 36.0 g/dL   RDW 14.1 11.5 - 15.5 %   Platelets 219 150 - 400 K/uL  CBG monitoring, ED  Result Value Ref Range   Glucose-Capillary 224 (H) 65 - 99 mg/dL    EKG  EKG Interpretation None       Radiology No results found.  Procedures Procedures (including critical care time)  Medications Ordered in ED Medications  sodium chloride 0.9 % bolus 1,000 mL (not administered)  ondansetron (ZOFRAN) injection 4 mg (not administered)     Initial Impression / Assessment and Plan / ED Course  I have reviewed the triage vital signs and the nursing notes.  Pertinent labs & imaging results that were available during my care of the patient were reviewed by me and considered in my medical decision making (see chart for details).  Iv ns bolus. Labs.  zofran iv.  Reviewed nursing notes and prior charts for additional history.   Trial po fluids.  abd soft nt.   w nv, pt unable to take htn med this evening. Dose of his home meds given as bp elevated.   Tolerates po.   abd soft nt.  Pt currently appears stable for d/c.     Final Clinical Impressions(s) / ED Diagnoses   Final diagnoses:  None    New Prescriptions New Prescriptions   No medications on file      Lajean Saver, MD 12/09/16 2141

## 2016-12-09 NOTE — ED Notes (Signed)
Pt was given ginger ale for po challenge.

## 2016-12-09 NOTE — ED Notes (Signed)
Pt stated he is unable to pee but will notify me when he is able to.

## 2016-12-09 NOTE — ED Triage Notes (Signed)
Pt c/o generalized abdominal pain, nausea, yellow emesis x 2 days. No hematemesis, cough, diarrhea.

## 2016-12-13 ENCOUNTER — Ambulatory Visit (INDEPENDENT_AMBULATORY_CARE_PROVIDER_SITE_OTHER): Payer: Medicare Other | Admitting: Family Medicine

## 2016-12-13 ENCOUNTER — Encounter: Payer: Self-pay | Admitting: Family Medicine

## 2016-12-13 VITALS — BP 164/92 | HR 90 | Temp 98.2°F | Resp 14 | Ht 69.0 in | Wt 235.0 lb

## 2016-12-13 DIAGNOSIS — E118 Type 2 diabetes mellitus with unspecified complications: Secondary | ICD-10-CM | POA: Diagnosis not present

## 2016-12-13 DIAGNOSIS — Z23 Encounter for immunization: Secondary | ICD-10-CM

## 2016-12-13 DIAGNOSIS — Z794 Long term (current) use of insulin: Secondary | ICD-10-CM | POA: Diagnosis not present

## 2016-12-13 DIAGNOSIS — I10 Essential (primary) hypertension: Secondary | ICD-10-CM

## 2016-12-13 DIAGNOSIS — F32A Depression, unspecified: Secondary | ICD-10-CM

## 2016-12-13 DIAGNOSIS — R600 Localized edema: Secondary | ICD-10-CM

## 2016-12-13 DIAGNOSIS — F329 Major depressive disorder, single episode, unspecified: Secondary | ICD-10-CM | POA: Diagnosis not present

## 2016-12-13 LAB — POCT URINALYSIS DIP (DEVICE)
Bilirubin Urine: NEGATIVE
GLUCOSE, UA: 500 mg/dL — AB
Hgb urine dipstick: NEGATIVE
Ketones, ur: NEGATIVE mg/dL
LEUKOCYTES UA: NEGATIVE
Nitrite: NEGATIVE
PROTEIN: 100 mg/dL — AB
SPECIFIC GRAVITY, URINE: 1.02 (ref 1.005–1.030)
UROBILINOGEN UA: 0.2 mg/dL (ref 0.0–1.0)
pH: 7.5 (ref 5.0–8.0)

## 2016-12-13 LAB — CBC
HCT: 38.2 % — ABNORMAL LOW (ref 38.5–50.0)
HEMOGLOBIN: 12.2 g/dL — AB (ref 13.2–17.1)
MCH: 27.9 pg (ref 27.0–33.0)
MCHC: 31.9 g/dL — ABNORMAL LOW (ref 32.0–36.0)
MCV: 87.4 fL (ref 80.0–100.0)
MPV: 10.5 fL (ref 7.5–12.5)
PLATELETS: 224 10*3/uL (ref 140–400)
RBC: 4.37 MIL/uL (ref 4.20–5.80)
RDW: 14.7 % (ref 11.0–15.0)
WBC: 4 10*3/uL (ref 3.8–10.8)

## 2016-12-13 LAB — GLUCOSE, CAPILLARY
GLUCOSE-CAPILLARY: 266 mg/dL — AB (ref 65–99)
GLUCOSE-CAPILLARY: 195 mg/dL — AB (ref 65–99)

## 2016-12-13 LAB — POCT GLYCOSYLATED HEMOGLOBIN (HGB A1C): Hemoglobin A1C: 9.1

## 2016-12-13 MED ORDER — NON FORMULARY
10.0000 [IU] | Freq: Once | Status: AC
  Administered 2016-12-13: 10 [IU] via SUBCUTANEOUS

## 2016-12-13 MED ORDER — ACCU-CHEK AVIVA PLUS W/DEVICE KIT: 1.0000 | PACK | Freq: Three times a day (TID) | 0 refills | Status: DC

## 2016-12-13 MED ORDER — CITALOPRAM HYDROBROMIDE 40 MG PO TABS: 40.0000 mg | ORAL_TABLET | Freq: Every day | ORAL | 1 refills | Status: DC

## 2016-12-13 MED ORDER — CLONIDINE HCL 0.1 MG PO TABS
0.2000 mg | ORAL_TABLET | Freq: Once | ORAL | Status: AC
  Administered 2016-12-13: 0.2 mg via ORAL

## 2016-12-13 NOTE — Progress Notes (Signed)
Patient ID: Troy Rush, male    DOB: 10-27-1970, 46 y.o.   MRN: [id number]  PCP: Dorena Dew, FNP  Chief Complaint  Patient presents with  . Diabetes  . Hypertension    Subjective:  HPI Troy Rush is a 46 y.o. male with uncontrolled insulin dependent diabetes, hypertension, and chronic heart failure, MDD presents for evaluation for 3 month follow-up on chronic disease management. Troy Rush is hearing impaired and is accompanied today by an interpreter.  Troy Rush reports inconsistent diabetes management. He is not  monitoring his blood sugar at home. Reports that he is under a great deal of stress as he is going through a divorce and custody dispute and therefore he attributes these factors to his poor adherence. Last hemoglobin A1c 9., 10/07/2016. Troy Rush requests another prescription for a glucometer as he reports a problem with insurance dispensing the glucometer prescribed to him previously. Troy Rush also suffers from heart failure and report recent lower extremitey swelling. He is noncompliant with low sodium diet. He is chronically prescribed furosemide for fluid retention and report adherence. He denies any known recent weight gain, chest pain, worsening of shortness of breath in comparison to baseline.  Social History   Social History  . Marital status: Legally Separated    Spouse name: N/A  . Number of children: N/A  . Years of education: N/A   Occupational History  . Not on file.   Social History Main Topics  . Smoking status: Never Smoker  . Smokeless tobacco: Never Used  . Alcohol use Yes     Comment: rare  . Drug use: No  . Sexual activity: Not on file   Other Topics Concern  . Not on file   Social History Narrative  . No narrative on file    Family History  Problem Relation Age of Onset  . Hypertension Mother    Review of Systems See HPI  Patient Active Problem List   Diagnosis Date Noted  . Ischemic cardiomyopathy 10/14/2016  . NICM (nonischemic cardiomyopathy)  ([facility]) 09/15/2016  . Chronic systolic CHF (congestive heart failure) ([facility]) 05/30/2016  . HTN (hypertension) 05/30/2016  . Deaf 05/30/2016  . Hyperkalemia 05/30/2016  . Diabetes ([facility]) 03/21/2016  . Major depressive disorder, recurrent episode ([facility]) 03/21/2016    Allergies  Allergen Reactions  . Lisinopril Cough    Prior to Admission medications   Medication Sig Start Date End Date Taking? Authorizing Provider  aspirin 81 MG chewable tablet Chew 81 mg by mouth daily.   Yes [provider]  atorvastatin (LIPITOR) 80 MG tablet Take 1 tablet (80 mg total) by mouth daily. 06/27/16  Yes Larey Dresser, MD  blood glucose meter kit and supplies KIT Dispense based on patient and insurance preference. Use up to four times daily as directed. (FOR ICD-9 250.00, 250.01). 03/21/16  Yes Micheline Chapman, NP  Blood Glucose Monitoring Suppl (ACCU-CHEK AVIVA PLUS) w/Device KIT 1 each by Does not apply route 4 (four) times daily -  before meals and at bedtime. 10/17/16  Yes Dorena Dew, FNP  carvedilol (COREG) 12.5 MG tablet Take 1 tablet (12.5 mg total) by mouth 2 (two) times daily. 10/15/16 10/10/17 Yes Bhagat, Bhavinkumar, PA  citalopram (CELEXA) 40 MG tablet Take 1 tablet (40 mg total) by mouth daily. 03/21/16  Yes Micheline Chapman, NP  digoxin (LANOXIN) 0.125 MG tablet Take 1 tablet (0.125 mg total) by mouth daily. 06/27/16  Yes Larey Dresser, MD  furosemide (LASIX) 20 MG tablet Take 1  tablet (20 mg total) by mouth daily. 09/19/16  Yes Larey Dresser, MD  glucose blood (ACCU-CHEK AVIVA PLUS) test strip Check blood three times per day and at bedtime 10/17/16  Yes Dorena Dew, FNP  hydrALAZINE (APRESOLINE) 50 MG tablet Take 1 tablet (50 mg total) by mouth 3 (three) times daily. 09/15/16 12/14/16 Yes Deboraha Sprang, MD  Insulin Glargine (LANTUS SOLOSTAR) 100 UNIT/ML Solostar Pen Inject 20 Units into the skin daily at 10 pm. Patient taking differently: Inject 30 Units into the skin daily at  10 pm.  04/18/16  Yes Dorena Dew, FNP  INSULIN SYRINGE .5CC/29G (B-D INS SYR ULTRAFINE .5CC/29G) 29G X 1/2" 0.5 ML MISC 1 each by Does not apply route 3 (three) times daily before meals. 04/18/16  Yes Dorena Dew, FNP  isosorbide mononitrate (IMDUR) 30 MG 24 hr tablet Take 1 tablet (30 mg total) by mouth daily. 10/15/16 10/10/17 Yes Bhagat, Bhavinkumar, PA  ivabradine (CORLANOR) 5 MG TABS tablet Take 1 tablet (5 mg total) by mouth 2 (two) times daily with a meal. 06/27/16  Yes Larey Dresser, MD  Lancet Devices [facility]) lancets Use as instructed 10/17/16  Yes Dorena Dew, FNP  metFORMIN (GLUCOPHAGE) 1000 MG tablet Take 1 tablet (1,000 mg total) by mouth 2 (two) times daily with a meal. 03/28/16  Yes Micheline Chapman, NP  sacubitril-valsartan (ENTRESTO) 24-26 MG Take 1 tablet by mouth 2 (two) times daily. 09/19/16  Yes Larey Dresser, MD  sitaGLIPtin (JANUVIA) 50 MG tablet Take 1 tablet (50 mg total) by mouth daily. 09/16/16  Yes Dorena Dew, FNP  promethazine (PHENERGAN) 25 MG tablet Take 1 tablet (25 mg total) by mouth every 6 (six) hours as needed for nausea or vomiting. Patient not taking: Reported on 12/13/2016 02/02/16   Shary Decamp, PA-C  spironolactone (ALDACTONE) 25 MG tablet Take 1 tablet (25 mg total) by mouth daily. 06/27/16 10/25/16  Larey Dresser, MD    Past Medical, Surgical Family and Social History reviewed and updated.    Objective:   Today's Vitals   12/13/16 1040 12/13/16 1103 12/13/16 1127  BP: (!) 180/97 (!) 177/88 (!) 174/98  Pulse: 90    Resp: 14    Temp: 98.2 F (36.8 C)    TempSrc: Oral    SpO2: 100%    Weight: 235 lb (106.6 kg)    Height: '5\' 9"'  (1.753 m)      Wt Readings from Last 3 Encounters:  12/13/16 235 lb (106.6 kg)  10/15/16 237 lb 7 oz (107.7 kg)  10/07/16 224 lb (101.6 kg)   Physical Exam  Constitutional: He is oriented to person, place, and time. He appears well-developed and well-nourished.  HENT:  Head:  Normocephalic and atraumatic.  Eyes: Pupils are equal, round, and reactive to light. Conjunctivae and EOM are normal.  Neck: Normal range of motion. Neck supple.  Cardiovascular: Normal rate, regular rhythm, normal heart sounds and intact distal pulses.   Pulmonary/Chest: Effort normal and breath sounds normal.  Musculoskeletal: Normal range of motion.  Neurological: He is alert and oriented to person, place, and time.  Skin: Skin is warm and dry.  Psychiatric: He has a normal mood and affect. His behavior is normal. Judgment and thought content normal.   Assessment & Plan:  1. Type 2 diabetes mellitus with complication, with long-term current use of insulin (HCC) - HgB A1c 9.1 unchanged from previously.  -Administered insulin (humalog) 10 Units; Inject 10 Units into the  skin once. - New glucometer prescription issued to patient today  -Increased lantus 30 units at bedtime, daily   2. Essential hypertension, accelerated and uncontrolled today. Uncertain if patient is consistently taking medication daily. - cloNIDine (CATAPRES) tablet 0.2 mg; Take 2 tablets (0.2 mg total) by mouth once administered once today  -take prescribed antihypertensive medications as prescribed. Do not skip doses  -keep follow-up with cardiology -improve efforts to adhere to a low sodium diet    3. Depression, unspecified depression type - Resume ecitalopram (CELEXA) 40 MG tablet; Take 1 tablet (40 mg total) by mouth daily.    4. Bilateral leg edema - Brain natriuretic peptide, if elevated increase lasix dose to diurese patient   5. Need for immunization against influenza - Flu Vaccine QUAD 36+ mos IM  RTC: 3 months Hypertension and Diabetes follow-up   Carroll Sage. Kenton Kingfisher, MSN, FNP-C [facility]  [address] C[address] [place], [place] [address] [phone]

## 2016-12-13 NOTE — Patient Instructions (Addendum)
Increase Lantus 30 units at bedtime. I have re-prescribed your Celexa for depression, take 1 tablet daily. Increase activity as tolerated by walking in efforts to loose weight. Discontinue sodium to reduce fluid retention.   Diabetes Mellitus and Food It is important for you to manage your blood sugar (glucose) level. Your blood glucose level can be greatly affected by what you eat. Eating healthier foods in the appropriate amounts throughout the day at about the same time each day will help you control your blood glucose level. It can also help slow or prevent worsening of your diabetes mellitus. Healthy eating may even help you improve the level of your blood pressure and reach or maintain a healthy weight. General recommendations for healthful eating and cooking habits include:  Eating meals and snacks regularly. Avoid going long periods of time without eating to lose weight.  Eating a diet that consists mainly of plant-based foods, such as fruits, vegetables, nuts, legumes, and whole grains.  Using low-heat cooking methods, such as baking, instead of high-heat cooking methods, such as deep frying.  Work with your dietitian to make sure you understand how to use the Nutrition Facts information on food labels. How can food affect me? Carbohydrates Carbohydrates affect your blood glucose level more than any other type of food. Your dietitian will help you determine how many carbohydrates to eat at each meal and teach you how to count carbohydrates. Counting carbohydrates is important to keep your blood glucose at a healthy level, especially if you are using insulin or taking certain medicines for diabetes mellitus. Alcohol Alcohol can cause sudden decreases in blood glucose (hypoglycemia), especially if you use insulin or take certain medicines for diabetes mellitus. Hypoglycemia can be a life-threatening condition. Symptoms of hypoglycemia (sleepiness, dizziness, and disorientation) are similar  to symptoms of having too much alcohol. If your health care provider has given you approval to drink alcohol, do so in moderation and use the following guidelines:  Women should not have more than one drink per day, and men should not have more than two drinks per day. One drink is equal to: ? 12 oz of beer. ? 5 oz of wine. ? 1 oz of hard liquor.  Do not drink on an empty stomach.  Keep yourself hydrated. Have water, diet soda, or unsweetened iced tea.  Regular soda, juice, and other mixers might contain a lot of carbohydrates and should be counted.  What foods are not recommended? As you make food choices, it is important to remember that all foods are not the same. Some foods have fewer nutrients per serving than other foods, even though they might have the same number of calories or carbohydrates. It is difficult to get your body what it needs when you eat foods with fewer nutrients. Examples of foods that you should avoid that are high in calories and carbohydrates but low in nutrients include:  Trans fats (most processed foods list trans fats on the Nutrition Facts label).  Regular soda.  Juice.  Candy.  Sweets, such as cake, pie, doughnuts, and cookies.  Fried foods.  What foods can I eat? Eat nutrient-rich foods, which will nourish your body and keep you healthy. The food you should eat also will depend on several factors, including:  The calories you need.  The medicines you take.  Your weight.  Your blood glucose level.  Your blood pressure level.  Your cholesterol level.  You should eat a variety of foods, including:  Protein. ? Lean cuts  of meat. ? Proteins low in saturated fats, such as fish, egg whites, and beans. Avoid processed meats.  Fruits and vegetables. ? Fruits and vegetables that may help control blood glucose levels, such as apples, mangoes, and yams.  Dairy products. ? Choose fat-free or low-fat dairy products, such as milk, yogurt, and  cheese.  Grains, bread, pasta, and rice. ? Choose whole grain products, such as multigrain bread, whole oats, and brown rice. These foods may help control blood pressure.  Fats. ? Foods containing healthful fats, such as nuts, avocado, olive oil, canola oil, and fish.  Does everyone with diabetes mellitus have the same meal plan? Because every person with diabetes mellitus is different, there is not one meal plan that works for everyone. It is very important that you meet with a dietitian who will help you create a meal plan that is just right for you. This information is not intended to replace advice given to you by your health care provider. Make sure you discuss any questions you have with your health care provider. Document Released: 12/23/2004 Document Revised: 09/03/2015 Document Reviewed: 02/22/2013 Elsevier Interactive Patient Education  2017 [employer].

## 2016-12-14 LAB — BASIC METABOLIC PANEL
BUN: 9 mg/dL (ref 7–25)
CHLORIDE: 99 mmol/L (ref 98–110)
CO2: 24 mmol/L (ref 20–32)
CREATININE: 0.99 mg/dL (ref 0.60–1.35)
Calcium: 9.3 mg/dL (ref 8.6–10.3)
Glucose, Bld: 201 mg/dL — ABNORMAL HIGH (ref 65–99)
Potassium: 4 mmol/L (ref 3.5–5.3)
SODIUM: 136 mmol/L (ref 135–146)

## 2016-12-14 LAB — BRAIN NATRIURETIC PEPTIDE: BRAIN NATRIURETIC PEPTIDE: 139.4 pg/mL — AB (ref <100)

## 2016-12-17 ENCOUNTER — Encounter: Payer: Self-pay | Admitting: Family Medicine

## 2016-12-18 MED ORDER — INSULIN GLARGINE 100 UNIT/ML SOLOSTAR PEN: 30.0000 [IU] | PEN_INJECTOR | Freq: Every day | SUBCUTANEOUS | 3 refills | Status: DC

## 2016-12-19 ENCOUNTER — Ambulatory Visit: Payer: Medicare Other | Admitting: Dietician

## 2016-12-19 ENCOUNTER — Encounter (HOSPITAL_COMMUNITY): Payer: Self-pay | Admitting: Cardiology

## 2016-12-19 ENCOUNTER — Ambulatory Visit (HOSPITAL_COMMUNITY)
Admission: RE | Admit: 2016-12-19 | Discharge: 2016-12-19 | Disposition: A | Discharge status: Home / Self Care | Payer: Medicare Other | Source: Ambulatory Visit | Attending: Cardiology | Admitting: Cardiology

## 2016-12-19 VITALS — BP 118/82 | HR 73 | Wt 230.0 lb

## 2016-12-19 DIAGNOSIS — I5022 Chronic systolic (congestive) heart failure: Secondary | ICD-10-CM | POA: Insufficient documentation

## 2016-12-19 DIAGNOSIS — Z7982 Long term (current) use of aspirin: Secondary | ICD-10-CM | POA: Diagnosis not present

## 2016-12-19 DIAGNOSIS — F329 Major depressive disorder, single episode, unspecified: Secondary | ICD-10-CM | POA: Diagnosis not present

## 2016-12-19 DIAGNOSIS — Z95810 Presence of automatic (implantable) cardiac defibrillator: Secondary | ICD-10-CM | POA: Diagnosis not present

## 2016-12-19 DIAGNOSIS — I10 Essential (primary) hypertension: Secondary | ICD-10-CM | POA: Diagnosis not present

## 2016-12-19 DIAGNOSIS — Z79899 Other long term (current) drug therapy: Secondary | ICD-10-CM | POA: Insufficient documentation

## 2016-12-19 DIAGNOSIS — H9190 Unspecified hearing loss, unspecified ear: Secondary | ICD-10-CM | POA: Diagnosis not present

## 2016-12-19 DIAGNOSIS — I110 Hypertensive heart disease with heart failure: Secondary | ICD-10-CM | POA: Diagnosis not present

## 2016-12-19 DIAGNOSIS — Z794 Long term (current) use of insulin: Secondary | ICD-10-CM | POA: Diagnosis not present

## 2016-12-19 DIAGNOSIS — E785 Hyperlipidemia, unspecified: Secondary | ICD-10-CM | POA: Diagnosis not present

## 2016-12-19 DIAGNOSIS — E119 Type 2 diabetes mellitus without complications: Secondary | ICD-10-CM | POA: Diagnosis not present

## 2016-12-19 LAB — BASIC METABOLIC PANEL
ANION GAP: 8 (ref 5–15)
BUN: 21 mg/dL — AB (ref 6–20)
CALCIUM: 8.9 mg/dL (ref 8.9–10.3)
CO2: 24 mmol/L (ref 22–32)
Chloride: 102 mmol/L (ref 101–111)
Creatinine, Ser: 1.4 mg/dL — ABNORMAL HIGH (ref 0.61–1.24)
GFR calc Af Amer: >60 mL/min (ref >60)
GFR, EST NON AFRICAN AMERICAN: 58 mL/min — AB (ref >60)
GLUCOSE: 130 mg/dL — AB (ref 65–99)
Potassium: 4.9 mmol/L (ref 3.5–5.1)
SODIUM: 134 mmol/L — AB (ref 135–145)

## 2016-12-19 LAB — DIGOXIN LEVEL

## 2016-12-19 MED ORDER — SACUBITRIL-VALSARTAN 49-51 MG PO TABS: 1.0000 | ORAL_TABLET | Freq: Two times a day (BID) | ORAL | 3 refills | Status: DC

## 2016-12-19 NOTE — Patient Instructions (Signed)
Increase Entresto to 49/51 mg Twice daily   Labs today  Labs in 10-14 days  We recommend you discuss with your Primary Care MD to change your Januvia to [place] physician recommends that you schedule a follow-up appointment in: 6 weeks

## 2016-12-20 NOTE — Progress Notes (Signed)
Advanced Heart Failure Clinic Note   PCP: Cammie Sickle Cardiology: Dr. Aundra Dubin  46 yo with deafness, HTN, DM, and chronic systolic CHF presents for cardiology followup.  Patient had no known cardiac problems until 10/16.  At that time, he developed exertional dyspnea and was admitted to a hospital in [place], [place] with acute systolic CHF.  Echo 10/16 showed EF 15-20%.  Cardiac cath showed no significant coronary disease.  He was diuresed and started on cardiac meds. Subsequently, he moved to [place]  Now working in a [employer].  Repeat echo in 5/18 showed persistently low EF, 25-30%.  He had [place] placed.   He returns for regular followup. Sign language interpreter present.  BP is controlled today.  He says he is taking all his meds except Veltassa. He is not sure why he is off this.  He is taking Lasix 20 mg daily currently.  His weight is up 19 lbs.  He says that he has been eating more and not exercising as much. He is short of breath walking up a hill or walking about 1 block.  No orthopnea/PND.  No chest pain.  No lightheadedness.   Labs (9/17): BNP 477, K 4.5 => 4.2, creatinine 0.92 => 1.1, BNP 1009, HIV negative, SPEP negative.  Labs (10/17): K 5.2, creatinine 1.10, hgb 12.5 Labs (03/28/2016): K 5.3 Creatinine 1.07  Labs (3/18): digoxin < 0.2, K 4.1, creatinine 0.97 Labs (9/18): hgb 12.2, K 4, creatinine 0.99, BNP 139  PMH: 1. HTN 2. Diabetes 3. Hyperlipidemia 4. Deafness since age 22: Needs sign language interpreter.  5. Chronic systolic CHF: Nonischemic cardiomyopathy.  Diagnosis 10/16 in [place], [place] (admitted with acute systolic CHF).  SPEP and HIV negative.  [place].  - Echo (10/16): EF 15-20%, mild LVH, mild MR, normal RV size and systolic function.  - LHC (10/16): No significant CAD.  - CPX (12/17): Mild functional limitation.  - 04/2016 CMRI: EF 27%. RV normal. No evidence of infiltrative disease, myocarditis, or MI.  - Echo (5/18): EF  25-30%, mild LV dilation, mild LVH, normal RV size with mildly decreased 6. Depression. 7. Colitis episodes.   SH: From [place] originally.  Lives in [place] until 2017, then moved to [place].  Nonsmoker.  Used to drink 3-4 ETOH beverages/day until 10/16, now stopped. Works in [employer].   FH: No known cardiac disease.   ROS: All systems reviewed and negative except as per HPI.   Current Outpatient Prescriptions  Medication Sig Dispense Refill  . aspirin 81 MG chewable tablet Chew 81 mg by mouth daily.    Marland Kitchen atorvastatin (LIPITOR) 80 MG tablet Take 1 tablet (80 mg total) by mouth daily. 90 tablet 3  . blood glucose meter kit and supplies KIT Dispense based on patient and insurance preference. Use up to four times daily as directed. (FOR ICD-9 250.00, 250.01). 1 each 0  . Blood Glucose Monitoring Suppl (ACCU-CHEK AVIVA PLUS) w/Device KIT 1 each by Does not apply route 4 (four) times daily -  before meals and at bedtime. E.11.8 1 kit 0  . carvedilol (COREG) 12.5 MG tablet Take 1 tablet (12.5 mg total) by mouth 2 (two) times daily. 180 tablet 3  . citalopram (CELEXA) 40 MG tablet Take 1 tablet (40 mg total) by mouth daily. 90 tablet 1  . digoxin (LANOXIN) 0.125 MG tablet Take 1 tablet (0.125 mg total) by mouth daily. 90 tablet 3  . furosemide (LASIX) 20 MG tablet Take 1 tablet (20 mg  total) by mouth daily. 30 tablet 3  . glucose blood (ACCU-CHEK AVIVA PLUS) test strip Check blood three times per day and at bedtime 100 each 12  . hydrALAZINE (APRESOLINE) 50 MG tablet Take 1 tablet (50 mg total) by mouth 3 (three) times daily. 270 tablet 2  . Insulin Glargine (LANTUS SOLOSTAR) 100 UNIT/ML Solostar Pen Inject 30 Units into the skin daily at 10 pm. 15 pen 3  . INSULIN SYRINGE .5CC/29G (B-D INS SYR ULTRAFINE .5CC/29G) 29G X 1/2" 0.5 ML MISC 1 each by Does not apply route 3 (three) times daily before meals. 200 each 2  . isosorbide mononitrate (IMDUR) 30 MG 24 hr tablet Take 1 tablet (30 mg total) by  mouth daily. 90 tablet 3  . ivabradine (CORLANOR) 5 MG TABS tablet Take 1 tablet (5 mg total) by mouth 2 (two) times daily with a meal. 180 tablet 3  . Lancet Devices (ACCU-CHEK SOFTCLIX) lancets Use as instructed 1 each 0  . metFORMIN (GLUCOPHAGE) 1000 MG tablet Take 1 tablet (1,000 mg total) by mouth 2 (two) times daily with a meal. 180 tablet 3  . sitaGLIPtin (JANUVIA) 50 MG tablet Take 1 tablet (50 mg total) by mouth daily. 30 tablet 2  . spironolactone (ALDACTONE) 25 MG tablet Take 1 tablet (25 mg total) by mouth daily. 90 tablet 3  . promethazine (PHENERGAN) 25 MG tablet Take 1 tablet (25 mg total) by mouth every 6 (six) hours as needed for nausea or vomiting. (Patient not taking: Reported on 12/19/2016) 30 tablet 0  . sacubitril-valsartan (ENTRESTO) 49-51 MG Take 1 tablet by mouth 2 (two) times daily. 60 tablet 3   No current facility-administered medications for this encounter.    BP 118/82   Pulse 73   Wt 230 lb (104.3 kg)   SpO2 98%   BMI 33.97 kg/m    Wt Readings from Last 3 Encounters:  12/19/16 230 lb (104.3 kg)  12/13/16 235 lb (106.6 kg)  10/15/16 237 lb 7 oz (107.7 kg)    General: NAD Neck: No JVD, no thyromegaly or thyroid nodule.  Lungs: Clear to auscultation bilaterally with normal respiratory effort. CV: Nondisplaced PMI.  Heart regular S1/S2, no S3/S4, no murmur.  No peripheral edema.  No carotid bruit.  Normal pedal pulses.  Abdomen: Soft, nontender, no hepatosplenomegaly, no distention.  Skin: Intact without lesions or rashes.  Neurologic: Alert and oriented x 3.  Psych: Normal affect. Extremities: No clubbing or cyanosis.  HEENT: Normal.   Assessment/Plan: 1. Chronic systolic CHF: Nonischemic cardiomyopathy, etiology uncertain. Consider prior ETOH or HTN.  He has a [place].  CMRI in 1/18 with EF 27%, RV normal, no evidence of infiltrative disease, myocarditis, or MI.  Echo was done in 5/18, EF remained 25-30%.  He is not volume overloaded on  exam despite weight gain (may be due to caloric intake). NYHA class II.  - Continue lasix 20 mg daily. BMET today. - Continue Coreg 12.5 mg BID - Increase Entresto to 49/51 bid, BMET in 10 days.      - Continue spironolactone 25 mg daily.   - Continue hydralazine/nitrates and Corlanor at current doses. - Continue digoxin 0.125 daily, check digoxin level today.  2. Type II diabetes: Would consider stopping sitagliptin given increased risk in patients with CHF.  Empaglifozin would likely be a better choice. .  3. HTN: BP appears controlled.  4. Deafness: Sign language interpreter present at visit.  5. Hyperkalemia: Resolved.  He is now off Veltassa.  Will assess K today.   Followup in 6 wks.   Loralie Champagne, MD  12/20/2016

## 2016-12-23 ENCOUNTER — Telehealth (HOSPITAL_COMMUNITY): Payer: Self-pay

## 2016-12-23 NOTE — Telephone Encounter (Signed)
Notes recorded by Shirley Muscat, RN on 12/23/2016 at 9:56 AM EDT Pt aware of results and states he is taking his Digoxin ------  Notes recorded by Harvie Junior, CMA on 12/20/2016 at 2:58 PM EDT Pt scheduled for bmet on 9/20. Unable to reach pt will try again tomorrow ------  Notes recorded by Larey Dresser, MD on 12/20/2016 at 7:54 AM EDT Make sure he is taking digoxin. Creatinine higher, make sure he gets repeat BMET in 7-10 days.

## 2016-12-26 ENCOUNTER — Ambulatory Visit: Payer: Medicare Other | Admitting: Dietician

## 2016-12-29 ENCOUNTER — Ambulatory Visit (HOSPITAL_COMMUNITY)
Admission: RE | Admit: 2016-12-29 | Discharge: 2016-12-29 | Disposition: A | Discharge status: Home / Self Care | Payer: Medicare Other | Source: Ambulatory Visit | Attending: Internal Medicine | Admitting: Internal Medicine

## 2016-12-29 DIAGNOSIS — I5022 Chronic systolic (congestive) heart failure: Secondary | ICD-10-CM | POA: Diagnosis not present

## 2016-12-29 LAB — BASIC METABOLIC PANEL
Anion gap: 8 (ref 5–15)
BUN: 22 mg/dL — ABNORMAL HIGH (ref 6–20)
CHLORIDE: 102 mmol/L (ref 101–111)
CO2: 24 mmol/L (ref 22–32)
Calcium: 9.3 mg/dL (ref 8.9–10.3)
Creatinine, Ser: 1.28 mg/dL — ABNORMAL HIGH (ref 0.61–1.24)
GFR calc Af Amer: >60 mL/min (ref >60)
GFR calc non Af Amer: >60 mL/min (ref >60)
GLUCOSE: 75 mg/dL (ref 65–99)
POTASSIUM: 4.6 mmol/L (ref 3.5–5.1)
Sodium: 134 mmol/L — ABNORMAL LOW (ref 135–145)

## 2017-01-09 ENCOUNTER — Encounter: Payer: Self-pay | Admitting: Dietician

## 2017-01-09 ENCOUNTER — Encounter: Payer: Medicare Other | Attending: Family Medicine | Admitting: Dietician

## 2017-01-09 DIAGNOSIS — Z713 Dietary counseling and surveillance: Secondary | ICD-10-CM | POA: Diagnosis not present

## 2017-01-09 DIAGNOSIS — E118 Type 2 diabetes mellitus with unspecified complications: Secondary | ICD-10-CM | POA: Diagnosis not present

## 2017-01-09 DIAGNOSIS — Z794 Long term (current) use of insulin: Secondary | ICD-10-CM

## 2017-01-09 NOTE — Patient Instructions (Addendum)
Send a message to your primary MD regarding your Januvia.  Continue to walk every day.  Even when school is out. Choose baked rather than fried most often. Eat more vegetables. Continue to drink water and other beverages without sugar.   Continue to avoid added salt and processed meat.

## 2017-01-09 NOTE — Progress Notes (Signed)
Diabetes Self-Management Education  Visit Type: Follow-up  Appt. Start Time: 1045 Appt. End Time: 2297  01/09/2017  [name], identified by name and date of birth, is a 46 y.o. male with a diagnosis of Diabetes:  Type 2 Diabetes.  Other hx includes HTN and CHF.  He states that he had a pacemaker placed in July.  His A1C has increased since his last visit with me 4 months ago.  He was inactive and gained 20 lbs.  Patient is deaf.  [name], interpretor from [facility] is here today along with an intern [name].  Medications include Lantus 30 units q HS and Metformin.  He was taking Januvia but just ran out.  He states that he was just told to stop this medication by his cardiologist secondary to a drug drug reaction.  Other medications include Spirolactone and Lasix.  Patient lives with a roommate.  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 He walks 40 minutes to and from the bus stop.  He was not walking when he was off work.  He does his own cooking at home and does not eat out except at the [place] at work.  His shifts are from 2 pm-9:30 pm or occasionally 11-5 and Saturday's 10am-9:30pm.  He reports increased stress this summer dealing with going to court (issues with his wife).    ASSESSMENT  Weight 230 lb (104.3 kg). Body mass index is 33.97 kg/m.      Diabetes Self-Management Education - 01/09/17 1106      Visit Information   Visit Type Follow-up     Complications   How often do you check your blood sugar? 3-4 times / week   Fasting Blood glucose range (mg/dL) 130-179;180-200;>200   Have you had a dilated eye exam in the past 12 months? Yes   Have you had a dental exam in the past 12 months? No   Are you checking your feet? Yes   How many days per week are you checking your feet? 7     Dietary Intake   Breakfast Oatmeal OR cornflakes and milk OR occasional eggs, and toast OR skips if rushed  9-10   Snack (morning) none   Lunch something  from the cafeteria (hamburger OR hot foods) "whatever I am in the mood for"  1   Snack (afternoon) none   Dinner something from [employer] (evening) none   Beverage(s) water, diet coke, unsweetened tea,      Exercise   Exercise Type Light (walking / raking leaves)   How many days per week to you exercise? 5   How many minutes per day do you exercise? 80   Total minutes per week of exercise 400     Patient Education   Previous Diabetes Education Yes (please comment)  3 months ago     Subsequent Visit   Since your last visit have you continued or begun to take your medications as prescribed? Yes   Since your last visit have you had your blood pressure checked? Yes   Is your most recent blood pressure lower, unchanged, or higher since your last visit? Higher   Since your last visit have you experienced any weight changes? Gain   Weight Gain (lbs) 20   Since your last visit, are you checking your blood glucose at least once a day? No      Individualized Plan for Diabetes Self-Management Training:  Learning Objective:  Patient will have a greater understanding of diabetes self-management. Patient education plan is to attend individual and/or group sessions per assessed needs and concerns.   Plan:   Patient Instructions  Send a message to your primary MD regarding your Januvia.  Continue to walk every day.  Even when school is out. Choose baked rather than fried most often. Eat more vegetables. Continue to drink water and other beverages without sugar.   Continue to avoid added salt and processed meat.   Expected Outcomes:   patient verbalized an interest in learning  Some continued changed expected.  Education material provided: Meal plan card and My Plate  If problems or questions, patient to contact team via:  My Chart  Future DSME appointment:   3 months

## 2017-01-18 ENCOUNTER — Emergency Department (HOSPITAL_COMMUNITY)
Admission: EM | Admit: 2017-01-18 | Discharge: 2017-01-18 | Disposition: A | Discharge status: Home / Self Care | Payer: No Typology Code available for payment source | Attending: Emergency Medicine | Admitting: Emergency Medicine

## 2017-01-18 ENCOUNTER — Encounter (HOSPITAL_COMMUNITY): Payer: Self-pay | Admitting: Emergency Medicine

## 2017-01-18 ENCOUNTER — Emergency Department (HOSPITAL_COMMUNITY): Payer: No Typology Code available for payment source

## 2017-01-18 DIAGNOSIS — Z7984 Long term (current) use of oral hypoglycemic drugs: Secondary | ICD-10-CM | POA: Diagnosis not present

## 2017-01-18 DIAGNOSIS — S61409A Unspecified open wound of unspecified hand, initial encounter: Secondary | ICD-10-CM | POA: Diagnosis not present

## 2017-01-18 DIAGNOSIS — I110 Hypertensive heart disease with heart failure: Secondary | ICD-10-CM | POA: Insufficient documentation

## 2017-01-18 DIAGNOSIS — Y9389 Activity, other specified: Secondary | ICD-10-CM | POA: Insufficient documentation

## 2017-01-18 DIAGNOSIS — S8992XA Unspecified injury of left lower leg, initial encounter: Secondary | ICD-10-CM | POA: Diagnosis not present

## 2017-01-18 DIAGNOSIS — Y999 Unspecified external cause status: Secondary | ICD-10-CM | POA: Diagnosis not present

## 2017-01-18 DIAGNOSIS — E119 Type 2 diabetes mellitus without complications: Secondary | ICD-10-CM | POA: Insufficient documentation

## 2017-01-18 DIAGNOSIS — T148XXA Other injury of unspecified body region, initial encounter: Secondary | ICD-10-CM | POA: Diagnosis not present

## 2017-01-18 DIAGNOSIS — M25521 Pain in right elbow: Secondary | ICD-10-CM | POA: Insufficient documentation

## 2017-01-18 DIAGNOSIS — I509 Heart failure, unspecified: Secondary | ICD-10-CM | POA: Diagnosis not present

## 2017-01-18 DIAGNOSIS — Z23 Encounter for immunization: Secondary | ICD-10-CM | POA: Diagnosis not present

## 2017-01-18 DIAGNOSIS — Y929 Unspecified place or not applicable: Secondary | ICD-10-CM | POA: Diagnosis not present

## 2017-01-18 DIAGNOSIS — Z79899 Other long term (current) drug therapy: Secondary | ICD-10-CM | POA: Diagnosis not present

## 2017-01-18 DIAGNOSIS — S80812A Abrasion, left lower leg, initial encounter: Secondary | ICD-10-CM | POA: Diagnosis not present

## 2017-01-18 DIAGNOSIS — M79662 Pain in left lower leg: Secondary | ICD-10-CM | POA: Diagnosis not present

## 2017-01-18 DIAGNOSIS — M25562 Pain in left knee: Secondary | ICD-10-CM | POA: Diagnosis present

## 2017-01-18 DIAGNOSIS — S50312A Abrasion of left elbow, initial encounter: Secondary | ICD-10-CM | POA: Diagnosis not present

## 2017-01-18 MED ORDER — TETANUS-DIPHTH-ACELL PERTUSSIS 5-2.5-18.5 LF-MCG/0.5 IM SUSP
0.5000 mL | Freq: Once | INTRAMUSCULAR | Status: AC
  Administered 2017-01-18: 0.5 mL via INTRAMUSCULAR
  Filled 2017-01-18: qty 0.5

## 2017-01-18 MED ORDER — IBUPROFEN 400 MG PO TABS: 400.0000 mg | ORAL_TABLET | Freq: Four times a day (QID) | ORAL | 0 refills | Status: DC | PRN

## 2017-01-18 MED ORDER — IBUPROFEN 400 MG PO TABS
400.0000 mg | ORAL_TABLET | Freq: Once | ORAL | Status: AC
  Administered 2017-01-18: 400 mg via ORAL
  Filled 2017-01-18: qty 1

## 2017-01-18 NOTE — ED Notes (Signed)
Pt stable, ambulatory, and reviewed d/s instructions with the help of an interpreter.
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
## 2017-01-24 ENCOUNTER — Encounter: Payer: Self-pay | Admitting: Internal Medicine

## 2017-01-24 ENCOUNTER — Ambulatory Visit (INDEPENDENT_AMBULATORY_CARE_PROVIDER_SITE_OTHER): Payer: Medicare Other | Admitting: Internal Medicine

## 2017-01-24 VITALS — BP 158/102 | HR 98 | Ht 71.0 in | Wt 232.0 lb

## 2017-01-24 DIAGNOSIS — I428 Other cardiomyopathies: Secondary | ICD-10-CM

## 2017-01-24 DIAGNOSIS — I5022 Chronic systolic (congestive) heart failure: Secondary | ICD-10-CM | POA: Diagnosis not present

## 2017-01-24 NOTE — Patient Instructions (Signed)
Medication Instructions:  Your physician recommends that you continue on your current medications as directed. Please refer to the Current Medication list given to you today.  -- If you need a refill on your cardiac medications before your next appointment, please call your pharmacy. --  Labwork: None ordered  Testing/Procedures: None ordered  Follow-Up: Your physician wants you to follow-up in: 9 months with Troy Standard, NP.  You will receive a reminder letter in the mail two months in advance. If you don't receive a letter, please call our office to schedule the follow-up appointment.  Remote monitoring is used to monitor your  ICD from home. This monitoring reduces the number of office visits required to check your device to one time per year. It allows [place] to keep an eye on the functioning of your device to ensure it is working properly. You are scheduled for a device check from home on 04/25/2017. You may send your transmission at any time that day. If you have a wireless device, the transmission will be sent automatically. After your physician reviews your transmission, you will receive a postcard with your next transmission date.    Thank you for choosing [facility]!!   Frederik Schmidt, RN [phone]  Any Other Special Instructions Will Be Listed Below (If Applicable).

## 2017-01-24 NOTE — Progress Notes (Signed)
Patient Care Team: Dorena Dew, FNP as PCP - General (Family Medicine) Conrad [place], NP as Nurse Practitioner (Cardiology) Larey Dresser, MD as Consulting Physician (Cardiology)   HPI  Troy Rush is a 46 y.o. male Deaf male with nonischemic cardiomyopathy poorly controlled hypertension who underwent ICD implantation for primary prevention   Denies chest pain or shortness of breath  Device healed without difficulty Records and Results Reviewed   Past Medical History:  Diagnosis Date  . AICD (automatic cardioverter/defibrillator) present 10/14/2016  . Bronchitis   . CHF (congestive heart failure) ([place])   . Depression   . Diabetes mellitus without complication ([place])   . Dyslipidemia   . Hypertension   . NICM (nonischemic cardiomyopathy) ([place]) 09/15/2016  . Pancreatitis     Past Surgical History:  Procedure Laterality Date  . BACK SURGERY    . CARDIAC CATHETERIZATION    . ICD IMPLANT  10/14/2016  . ICD IMPLANT N/A 10/14/2016   Procedure: ICD Implant;  Surgeon: Deboraha Sprang, MD;  Location: [facility];  Service: Cardiovascular;  Laterality: N/A;  . WRIST SURGERY      Current Outpatient Prescriptions  Medication Sig Dispense Refill  . aspirin 81 MG chewable tablet Chew 81 mg by mouth daily.    Marland Kitchen atorvastatin (LIPITOR) 80 MG tablet Take 1 tablet (80 mg total) by mouth daily. 90 tablet 3  . blood glucose meter kit and supplies KIT Dispense based on patient and insurance preference. Use up to four times daily as directed. (FOR ICD-9 250.00, 250.01). 1 each 0  . Blood Glucose Monitoring Suppl (ACCU-CHEK AVIVA PLUS) w/Device KIT 1 each by Does not apply route 4 (four) times daily -  before meals and at bedtime. E.11.8 1 kit 0  . carvedilol (COREG) 12.5 MG tablet Take 1 tablet (12.5 mg total) by mouth 2 (two) times daily. 180 tablet 3  . citalopram (CELEXA) 40 MG tablet Take 1 tablet (40 mg total) by mouth daily. 90 tablet 1  . digoxin (LANOXIN) 0.125 MG  tablet Take 1 tablet (0.125 mg total) by mouth daily. 90 tablet 3  . furosemide (LASIX) 20 MG tablet Take 1 tablet (20 mg total) by mouth daily. 30 tablet 3  . glucose blood (ACCU-CHEK AVIVA PLUS) test strip Check blood three times per day and at bedtime 100 each 12  . hydrALAZINE (APRESOLINE) 50 MG tablet Take 1 tablet (50 mg total) by mouth 3 (three) times daily. 270 tablet 2  . ibuprofen (ADVIL,MOTRIN) 400 MG tablet Take 1 tablet (400 mg total) by mouth every 6 (six) hours as needed. 30 tablet 0  . Insulin Glargine (LANTUS SOLOSTAR) 100 UNIT/ML Solostar Pen Inject 30 Units into the skin daily at 10 pm. 15 pen 3  . INSULIN SYRINGE .5CC/29G (B-D INS SYR ULTRAFINE .5CC/29G) 29G X 1/2" 0.5 ML MISC 1 each by Does not apply route 3 (three) times daily before meals. 200 each 2  . isosorbide mononitrate (IMDUR) 30 MG 24 hr tablet Take 1 tablet (30 mg total) by mouth daily. 90 tablet 3  . ivabradine (CORLANOR) 5 MG TABS tablet Take 1 tablet (5 mg total) by mouth 2 (two) times daily with a meal. 180 tablet 3  . Lancet Devices (ACCU-CHEK SOFTCLIX) lancets Use as instructed 1 each 0  . metFORMIN (GLUCOPHAGE) 1000 MG tablet Take 1 tablet (1,000 mg total) by mouth 2 (two) times daily with a meal. 180 tablet 3  . promethazine (PHENERGAN) 25  MG tablet Take 1 tablet (25 mg total) by mouth every 6 (six) hours as needed for nausea or vomiting. 30 tablet 0  . sacubitril-valsartan (ENTRESTO) 49-51 MG Take 1 tablet by mouth 2 (two) times daily. 60 tablet 3  . sitaGLIPtin (JANUVIA) 50 MG tablet Take 1 tablet (50 mg total) by mouth daily. 30 tablet 2  . spironolactone (ALDACTONE) 25 MG tablet Take 1 tablet (25 mg total) by mouth daily. 90 tablet 3   No current facility-administered medications for this visit.     Allergies  Allergen Reactions  . Lisinopril Cough      Review of Systems negative except from HPI and PMH  Physical Exam BP (!) 158/102   Pulse 98   Ht _0  (1.803 m)   Wt 232 lb (105.2 kg)    SpO2 99%   BMI 32.36 kg/m  Well developed and well nourished in no acute distress HENT normal E scleral and icterus clear Neck Supple JVP flat; carotids brisk and full Clear to ausculation Device pocket well healed; without hematoma or erythema.  There is no tethering \ *Regular rate and rhythm, no murmurs gallops or rub Soft with active bowel sounds No clubbing cyanosis Trace Edema Alert and oriented, grossly normal motor and sensory function Skin Warm and Dry    Assessment and  Plan  Nonischemic cardiomyopathy  Congestive heart failure-chronic-systolic IIb-IIIa  Hypertension-poorly controlled  Sleep disordered breathing question sleep apnea  Deafness-noncongenital  Hyperlipidemia  Renal failure acute chronic  BP still poorly controlled recommend getting home BP measurement s and following up with PCP  Euvolemic continue current meds     Current medicines are reviewed at length with the patient today .  The patient does not have concerns regarding meds

## 2017-01-25 LAB — CUP PACEART INCLINIC DEVICE CHECK
HighPow Impedance: 60 Ohm
Implantable Lead Location: 753860
Implantable Lead Model: 293
Implantable Pulse Generator Implant Date: 20180706
Lead Channel Pacing Threshold Pulse Width: 0.4 ms
Lead Channel Setting Pacing Pulse Width: 0.4 ms
Lead Channel Setting Sensing Sensitivity: 0.5 mV
MDC IDC LEAD IMPLANT DT: 20180706
MDC IDC LEAD SERIAL: 432676
MDC IDC MSMT LEADCHNL RV IMPEDANCE VALUE: 472 Ohm
MDC IDC MSMT LEADCHNL RV PACING THRESHOLD AMPLITUDE: 1.1 V
MDC IDC MSMT LEADCHNL RV SENSING INTR AMPL: 24.5 mV
MDC IDC PG SERIAL: 236570
MDC IDC SESS DTM: 20181016040000
MDC IDC SET LEADCHNL RV PACING AMPLITUDE: 2.5 V
MDC IDC STAT BRADY RV PERCENT PACED: 0 %

## 2017-01-27 ENCOUNTER — Encounter: Payer: Self-pay | Admitting: Family Medicine

## 2017-01-30 ENCOUNTER — Encounter (HOSPITAL_COMMUNITY): Payer: Medicare Other

## 2017-02-06 ENCOUNTER — Ambulatory Visit (HOSPITAL_COMMUNITY)
Admission: RE | Admit: 2017-02-06 | Discharge: 2017-02-06 | Disposition: A | Discharge status: Home / Self Care | Payer: Medicare Other | Source: Ambulatory Visit | Attending: Cardiology | Admitting: Cardiology

## 2017-02-06 VITALS — BP 122/88 | HR 98 | Wt 231.8 lb

## 2017-02-06 DIAGNOSIS — Z79899 Other long term (current) drug therapy: Secondary | ICD-10-CM | POA: Insufficient documentation

## 2017-02-06 DIAGNOSIS — E119 Type 2 diabetes mellitus without complications: Secondary | ICD-10-CM | POA: Diagnosis not present

## 2017-02-06 DIAGNOSIS — I5022 Chronic systolic (congestive) heart failure: Secondary | ICD-10-CM

## 2017-02-06 DIAGNOSIS — Z794 Long term (current) use of insulin: Secondary | ICD-10-CM | POA: Diagnosis not present

## 2017-02-06 DIAGNOSIS — H9190 Unspecified hearing loss, unspecified ear: Secondary | ICD-10-CM | POA: Insufficient documentation

## 2017-02-06 DIAGNOSIS — E785 Hyperlipidemia, unspecified: Secondary | ICD-10-CM | POA: Insufficient documentation

## 2017-02-06 DIAGNOSIS — Z7982 Long term (current) use of aspirin: Secondary | ICD-10-CM | POA: Diagnosis not present

## 2017-02-06 DIAGNOSIS — I10 Essential (primary) hypertension: Secondary | ICD-10-CM | POA: Diagnosis not present

## 2017-02-06 DIAGNOSIS — I428 Other cardiomyopathies: Secondary | ICD-10-CM

## 2017-02-06 DIAGNOSIS — F329 Major depressive disorder, single episode, unspecified: Secondary | ICD-10-CM | POA: Insufficient documentation

## 2017-02-06 DIAGNOSIS — I110 Hypertensive heart disease with heart failure: Secondary | ICD-10-CM | POA: Insufficient documentation

## 2017-02-06 DIAGNOSIS — K529 Noninfective gastroenteritis and colitis, unspecified: Secondary | ICD-10-CM | POA: Diagnosis not present

## 2017-02-06 DIAGNOSIS — E875 Hyperkalemia: Secondary | ICD-10-CM

## 2017-02-06 DIAGNOSIS — E118 Type 2 diabetes mellitus with unspecified complications: Secondary | ICD-10-CM

## 2017-02-06 LAB — BASIC METABOLIC PANEL
ANION GAP: 9 (ref 5–15)
BUN: 29 mg/dL — ABNORMAL HIGH (ref 6–20)
CHLORIDE: 99 mmol/L — AB (ref 101–111)
CO2: 25 mmol/L (ref 22–32)
Calcium: 9.4 mg/dL (ref 8.9–10.3)
Creatinine, Ser: 1.12 mg/dL (ref 0.61–1.24)
GFR calc Af Amer: >60 mL/min (ref >60)
GLUCOSE: 162 mg/dL — AB (ref 65–99)
POTASSIUM: 4.7 mmol/L (ref 3.5–5.1)
Sodium: 133 mmol/L — ABNORMAL LOW (ref 135–145)

## 2017-02-06 MED ORDER — CARVEDILOL 12.5 MG PO TABS: 18.7500 mg | ORAL_TABLET | Freq: Two times a day (BID) | ORAL | 3 refills | Status: DC
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
## 2017-02-06 NOTE — Patient Instructions (Signed)
INCREASE Coreg to 18.75 mg, one and one half tab twice a day  Labs today We will only contact you if something comes back abnormal or we need to make some changes. Otherwise no news is good news!  Your physician recommends that you schedule a follow-up appointment in: 3 months with Dr Aundra Dubin  Do the following things EVERYDAY: 1) Weigh yourself in the morning before breakfast. Write it down and keep it in a log. 2) Take your medicines as prescribed 3) Eat low salt foods-Limit salt (sodium) to 2000 mg per day.  4) Stay as active as you can everyday 5) Limit all fluids for the day to less than 2 liters

## 2017-02-13 ENCOUNTER — Ambulatory Visit: Payer: Medicare Other | Admitting: Family Medicine

## 2017-03-07 ENCOUNTER — Telehealth (HOSPITAL_COMMUNITY): Payer: Self-pay | Admitting: Vascular Surgery

## 2017-03-07 NOTE — Telephone Encounter (Signed)
Left pt message to make 3 month f/u appt w/ [facility] in Jan

## 2017-04-14 ENCOUNTER — Telehealth: Payer: Self-pay

## 2017-04-14 NOTE — Telephone Encounter (Signed)
[place],  Please see the message below regarding referral. I don't see that you've recently seen patient in office. Please review for Diabetes Education referral extension upon your return.  Carroll Sage. Kenton Kingfisher, MSN, FNP-C [facility]  [address] C[address] [place], [place] [address] [phone]

## 2017-04-14 NOTE — Telephone Encounter (Signed)
Nutrition and Diabetes needs a updated referral put in on patient so they can schedule follow up with patient as he referral expires on 04/18/2017.

## 2017-04-17 ENCOUNTER — Telehealth: Payer: Self-pay

## 2017-04-17 ENCOUNTER — Other Ambulatory Visit: Payer: Self-pay | Admitting: Family Medicine

## 2017-04-17 ENCOUNTER — Ambulatory Visit: Payer: Medicare Other | Admitting: Dietician

## 2017-04-17 NOTE — Telephone Encounter (Signed)
Called, no answer.

## 2017-04-18 NOTE — Telephone Encounter (Signed)
Troy Rush has tried to contact patient for appointment with no success. VM has been left for patient

## 2017-04-19 ENCOUNTER — Telehealth (HOSPITAL_COMMUNITY): Payer: Self-pay | Admitting: Vascular Surgery

## 2017-04-19 NOTE — Telephone Encounter (Signed)
Left pt message to make f/u appt w/ [facility] 

## 2017-04-25 ENCOUNTER — Telehealth: Payer: Self-pay | Admitting: Cardiology

## 2017-04-25 ENCOUNTER — Ambulatory Visit (INDEPENDENT_AMBULATORY_CARE_PROVIDER_SITE_OTHER): Payer: Medicare Other | Admitting: *Deleted

## 2017-04-25 DIAGNOSIS — I428 Other cardiomyopathies: Secondary | ICD-10-CM | POA: Diagnosis not present

## 2017-04-25 NOTE — Telephone Encounter (Signed)
LMOVM reminding pt to send remote transmission.   

## 2017-04-26 NOTE — Progress Notes (Signed)
Remote ICD transmission.   

## 2017-04-28 ENCOUNTER — Encounter: Payer: Self-pay | Admitting: Cardiology

## 2017-05-01 LAB — CUP PACEART REMOTE DEVICE CHECK
Date Time Interrogation Session: 20190121152731
Implantable Lead Implant Date: 20180706
Implantable Lead Model: 293
Implantable Lead Serial Number: 432676
MDC IDC LEAD LOCATION: 753860
MDC IDC PG IMPLANT DT: 20180706
MDC IDC PG SERIAL: 236570

## 2017-05-09 ENCOUNTER — Ambulatory Visit (INDEPENDENT_AMBULATORY_CARE_PROVIDER_SITE_OTHER): Payer: Medicare Other | Admitting: Family Medicine

## 2017-05-09 ENCOUNTER — Encounter: Payer: Self-pay | Admitting: Family Medicine

## 2017-05-09 VITALS — BP 134/86 | HR 80 | Temp 97.9°F | Ht 71.0 in | Wt 234.0 lb

## 2017-05-09 DIAGNOSIS — H547 Unspecified visual loss: Secondary | ICD-10-CM | POA: Diagnosis not present

## 2017-05-09 DIAGNOSIS — I10 Essential (primary) hypertension: Secondary | ICD-10-CM

## 2017-05-09 DIAGNOSIS — R350 Frequency of micturition: Secondary | ICD-10-CM | POA: Diagnosis not present

## 2017-05-09 DIAGNOSIS — Z794 Long term (current) use of insulin: Secondary | ICD-10-CM

## 2017-05-09 DIAGNOSIS — E118 Type 2 diabetes mellitus with unspecified complications: Secondary | ICD-10-CM | POA: Diagnosis not present

## 2017-05-09 LAB — POCT URINALYSIS DIP (DEVICE)
BILIRUBIN URINE: NEGATIVE
GLUCOSE, UA: 100 mg/dL — AB
HGB URINE DIPSTICK: NEGATIVE
KETONES UR: NEGATIVE mg/dL
LEUKOCYTES UA: NEGATIVE
Nitrite: NEGATIVE
Protein, ur: NEGATIVE mg/dL
SPECIFIC GRAVITY, URINE: 1.01 (ref 1.005–1.030)
Urobilinogen, UA: 0.2 mg/dL (ref 0.0–1.0)
pH: 5 (ref 5.0–8.0)

## 2017-05-09 LAB — POCT GLYCOSYLATED HEMOGLOBIN (HGB A1C): Hemoglobin A1C: 10.8

## 2017-05-09 MED ORDER — DAPAGLIFLOZIN PROPANEDIOL 5 MG PO TABS: 5.0000 mg | ORAL_TABLET | Freq: Every day | ORAL | 1 refills | Status: DC

## 2017-05-09 MED ORDER — SITAGLIPTIN PHOSPHATE 50 MG PO TABS: 50.0000 mg | ORAL_TABLET | Freq: Every day | ORAL | 2 refills | Status: DC

## 2017-05-09 MED ORDER — ACCU-CHEK AVIVA PLUS W/DEVICE KIT: 1.0000 | PACK | Freq: Three times a day (TID) | 0 refills | Status: DC

## 2017-05-09 MED ORDER — INSULIN GLARGINE 100 UNIT/ML SOLOSTAR PEN: 35.0000 [IU] | PEN_INJECTOR | Freq: Every day | SUBCUTANEOUS | 3 refills | Status: DC

## 2017-05-09 MED ORDER — GLUCOSE BLOOD VI STRP: ORAL_STRIP | 12 refills | Status: DC

## 2017-05-09 NOTE — Progress Notes (Signed)
Patient ID: Troy Rush, male    DOB: 06/08/1970, 47 y.o.   MRN: [id number]  PCP: Troy Dew, FNP  Chief Complaint  Patient presents with  . Follow-up    3 month diabetes and blood pressure    Subjective:  HPI Present today with Sign Language Interpreter: Troy Rush is a 47 y.o. male with uncontrolled type 2 diabetes, uncontrolled hypertension, and CHF presents for 3 month follow-up of chronic conditions. Troy Rush doesn't routinely monitor blood sugar at home. A prescription for a glucometer was provided during his last office visit.  was provided to  as provided during last office visit, however reports never obtaining a meter.  He lives a mainly sedentary lifestyle and does not engage in dietary restrictions.  Last A1C 9.1. He also suffers from hypertension and CHF.  He is followed by the heart failure clinic at [facility]. Admits to intermittent medication nonadherence. Reports associated worsening visual acuity and urinary frequency although denies symptoms of hypoglycemia, pins and needle pain,  polydipsia , SOB, chest pain, or weakness. Reports no routine weight monitoring at home. Current Body mass index is 32.64 kg/m. he requests today, to have his PSA level checked. He received a letter from his insurance recommending this health prevention screening. Social History   Socioeconomic History  . Marital status: Legally Separated    Spouse name: Not on file  . Number of children: Not on file  . Years of education: Not on file  . Highest education level: Not on file  Social Needs  . Financial resource strain: Not on file  . Food insecurity - worry: Not on file  . Food insecurity - inability: Not on file  . Transportation needs - medical: Not on file  . Transportation needs - non-medical: Not on file  Occupational History  . Not on file  Tobacco Use  . Smoking status: Never Smoker  . Smokeless tobacco: Never Used  Substance and Sexual Activity  . Alcohol use: Yes   Comment: rare  . Drug use: No  . Sexual activity: Not on file  Other Topics Concern  . Not on file  Social History Narrative  . Not on file   Family History  Problem Relation Age of Onset  . Hypertension Mother    Review of Systems  Constitutional: Negative.   Eyes: Positive for visual disturbance.  Respiratory: Negative.   Cardiovascular: Negative.   Endocrine: Negative for polydipsia, polyphagia and polyuria.  Musculoskeletal: Negative.   Skin: Negative.   Neurological: Negative.   Psychiatric/Behavioral: Negative.    Patient Active Problem List   Diagnosis Date Noted  . Ischemic cardiomyopathy 10/14/2016  . NICM (nonischemic cardiomyopathy) ([place]) 09/15/2016  . Chronic systolic CHF (congestive heart failure) ([place]) 05/30/2016  . HTN (hypertension) 05/30/2016  . Deaf 05/30/2016  . Hyperkalemia 05/30/2016  . Diabetes ([place]) 03/21/2016  . Major depressive disorder, recurrent episode ([place]) 03/21/2016    Allergies  Allergen Reactions  . Lisinopril Cough    Prior to Admission medications   Medication Sig Start Date End Date Taking? Authorizing Provider  aspirin 81 MG chewable tablet Chew 81 mg by mouth daily.   Yes [provider]  atorvastatin (LIPITOR) 80 MG tablet Take 1 tablet (80 mg total) by mouth daily. 06/27/16  Yes Larey Dresser, MD  blood glucose meter kit and supplies KIT Dispense based on patient and insurance preference. Use up to four times daily as directed. (FOR ICD-9 250.00, 250.01). 03/21/16  Yes Micheline Chapman, NP  Blood Glucose Monitoring Suppl (ACCU-CHEK AVIVA PLUS) w/Device KIT 1 each by Does not apply route 4 (four) times daily -  before meals and at bedtime. E.11.8 12/13/16  Yes Scot Jun, FNP  carvedilol (COREG) 12.5 MG tablet Take 1.5 tablets (18.75 mg total) by mouth 2 (two) times daily. 02/06/17 02/01/18 Yes Arbutus Leas, NP  citalopram (CELEXA) 40 MG tablet Take 1 tablet (40 mg total) by mouth daily. 12/13/16  Yes Scot Jun, FNP  digoxin (LANOXIN) 0.125 MG tablet Take 1 tablet (0.125 mg total) by mouth daily. 06/27/16  Yes Larey Dresser, MD  furosemide (LASIX) 20 MG tablet Take 1 tablet (20 mg total) by mouth daily. 09/19/16  Yes Larey Dresser, MD  glucose blood (ACCU-CHEK AVIVA PLUS) test strip Check blood three times per day and at bedtime 10/17/16  Yes Troy Dew, FNP  hydrALAZINE (APRESOLINE) 50 MG tablet Take 1 tablet (50 mg total) by mouth 3 (three) times daily. 09/15/16 12/19/17 Yes Deboraha Sprang, MD  ibuprofen (ADVIL,MOTRIN) 400 MG tablet Take 1 tablet (400 mg total) by mouth every 6 (six) hours as needed. 01/18/17  Yes Mesner, Corene Cornea, MD  Insulin Glargine (LANTUS SOLOSTAR) 100 UNIT/ML Solostar Pen Inject 30 Units into the skin daily at 10 pm. 12/18/16  Yes Scot Jun, FNP  INSULIN SYRINGE .5CC/29G (B-D INS SYR ULTRAFINE .5CC/29G) 29G X 1/2" 0.5 ML MISC 1 each by Does not apply route 3 (three) times daily before meals. 04/18/16  Yes Troy Dew, FNP  isosorbide mononitrate (IMDUR) 30 MG 24 hr tablet Take 1 tablet (30 mg total) by mouth daily. 10/15/16 10/10/17 Yes Bhagat, Bhavinkumar, PA  ivabradine (CORLANOR) 5 MG TABS tablet Take 1 tablet (5 mg total) by mouth 2 (two) times daily with a meal. 06/27/16  Yes Larey Dresser, MD  Lancet Devices [facility]) lancets Use as instructed 10/17/16  Yes Troy Dew, FNP  metFORMIN (GLUCOPHAGE) 1000 MG tablet Take 1 tablet (1,000 mg total) by mouth 2 (two) times daily with a meal. 03/28/16  Yes Micheline Chapman, NP  promethazine (PHENERGAN) 25 MG tablet Take 1 tablet (25 mg total) by mouth every 6 (six) hours as needed for nausea or vomiting. 02/02/16  Yes Shary Decamp, PA-C  sacubitril-valsartan (ENTRESTO) 49-51 MG Take 1 tablet by mouth 2 (two) times daily. 12/19/16  Yes Larey Dresser, MD  spironolactone (ALDACTONE) 25 MG tablet Take 1 tablet (25 mg total) by mouth daily. 06/27/16 12/19/17 Yes Larey Dresser, MD  sitaGLIPtin  (JANUVIA) 50 MG tablet Take 1 tablet (50 mg total) by mouth daily. 09/16/16   Troy Dew, FNP    Past Medical, Surgical Family and Social History reviewed and updated.    Objective:   Today's Vitals   05/09/17 1120  BP: 134/86  Pulse: 80  Temp: 97.9 F (36.6 C)  TempSrc: Oral  SpO2: 97%  Weight: 234 lb (106.1 kg)  Height: _0  (1.803 m)    Wt Readings from Last 3 Encounters:  05/09/17 234 lb (106.1 kg)  02/06/17 231 lb 12.8 oz (105.1 kg)  01/24/17 232 lb (105.2 kg)   Physical Exam  Constitutional: He is oriented to person, place, and time. He appears well-developed and well-nourished.  HENT:  Head: Normocephalic and atraumatic.  Patient is deaf  Eyes: Conjunctivae are normal. Pupils are equal, round, and reactive to light.  Neck: Normal range of motion. Neck supple.  Cardiovascular: Normal rate, regular rhythm, normal heart sounds  and intact distal pulses.  Pulmonary/Chest: Effort normal and breath sounds normal.  Abdominal: Soft. Bowel sounds are normal. He exhibits no distension.  Musculoskeletal: Normal range of motion. He exhibits no edema.  Lymphadenopathy:    He has no cervical adenopathy.  Neurological: He is alert and oriented to person, place, and time.  Skin: Skin is warm and dry.  Psychiatric: He has a normal mood and affect. His behavior is normal. Judgment and thought content normal.     Assessment & Plan:  1. Type 2 diabetes mellitus with complication, with long-term current use of insulin ([place]), uncontrolled today. A1C 10.8. Increasing Lantus from 20 units once daily to 35 units once daily at bedtime. Discontinue Januvia (due to heart failure side effects). Start Dapagliflozin  5 mg once daily (advised medication may require a PA). New glucometer prescription provided today.  Patient to begin to monitor and log blood sugars in efforts to improve glycemic control.  Encourage exercise as tolerated.Reduce intake of starchy bread type foods.  Increase  intake of vegetables and limit moderation of intake of fruits.  Discontinue sweetened sugary beverages.  Place with water.  Goal A1c at next follow-up is less than 8. 2. Essential hypertension, stable, controlled. BP is very stable today compared to prior readings. No changes in current medication regimen. We have discussed target BP range and blood pressure goal. I have advised patient to check BP regularly and to call us back or report to clinic if the numbers are consistently higher than 140/90. We discussed the importance of compliance with medical therapy and DASH diet recommended, consequences of uncontrolled hypertension discussed.   3. Urinary frequency, checking a PSA level, however frequency is more than likely secondary to uncontrolled DM.   4. Diminished vision, and is overdue for diabetic eye exam.  Placed on referral to [facility]. - Ambulatory referral to Ophthalmology    Meds ordered this encounter  Medications  . Blood Glucose Monitoring Suppl (ACCU-CHEK AVIVA PLUS) w/Device KIT    Sig: 1 each by Does not apply route 4 (four) times daily -  before meals and at bedtime. E.11.8    Dispense:  1 kit    Refill:  0    Order Specific Question:   Supervising Provider    Answer:   Tresa Garter [id number]  . DISCONTD: sitaGLIPtin (JANUVIA) 50 MG tablet    Sig: Take 1 tablet (50 mg total) by mouth daily.    Dispense:  30 tablet    Refill:  2    Order Specific Question:   Supervising Provider    Answer:   Tresa Garter [id number]  . glucose blood (ACCU-CHEK AVIVA PLUS) test strip    Sig: Check blood three times per day and at bedtime    Dispense:  200 each    Refill:  12    Order Specific Question:   Supervising Provider    Answer:   Tresa Garter [[id number]]  . Insulin Glargine (LANTUS SOLOSTAR) 100 UNIT/ML Solostar Pen    Sig: Inject 35 Units into the skin daily at 10 pm.    Dispense:  15 pen    Refill:  3    Placed script on file refill at request pt.  Dose change updated    Order Specific Question:   Supervising Provider    Answer:   Tresa Garter [id number]  . dapagliflozin propanediol (FARXIGA) 5 MG TABS tablet    Sig: Take 5 mg by mouth daily.  Dispense:  90 tablet    Refill:  1    Order Specific Question:   Supervising Provider    Answer:   Tresa Garter [id number]   Orders Placed This Encounter  Procedures  . PSA  . Comprehensive metabolic panel  . CBC with Differential  . Ambulatory referral to Ophthalmology  . POCT urinalysis dip (device)  . POCT glycosylated hemoglobin (Hb A1C)   RTC: 3 months for chronic conditions management    Carroll Sage. Kenton Kingfisher, MSN, FNP-C [facility]  [address] [place], [place] [address] [phone]

## 2017-05-09 NOTE — Patient Instructions (Addendum)
I have referred you to [facility] ophthalmology for evaluation of worsening vision.  They will contact you to schedule appointment. I have provided you with a prescription to obtain a glucometer.  I would like for you to check your blood sugar every morning prior to eating or drinking and keep a log of your fasting blood sugars.  You may check your blood sugar more often as needed. Discontinue Januvia.  I am placing an order [place] medication will require a prior authorization by insurance once approved we will notify you via phone.  I will see back in office in 3 months. You A1C has increases 10.8. I am increasing Lantus 35 units.    Diabetes Mellitus and Nutrition When you have diabetes (diabetes mellitus), it is very important to have healthy eating habits because your blood sugar (glucose) levels are greatly affected by what you eat and drink. Eating healthy foods in the appropriate amounts, at about the same times every day, can help you:  Control your blood glucose.  Lower your risk of heart disease.  Improve your blood pressure.  Reach or maintain a healthy weight.  Every person with diabetes is different, and each person has different needs for a meal plan. Your health care provider may recommend that you work with a diet and nutrition specialist (dietitian) to make a meal plan that is best for you. Your meal plan may vary depending on factors such as:  The calories you need.  The medicines you take.  Your weight.  Your blood glucose, blood pressure, and cholesterol levels.  Your activity level.  Other health conditions you have, such as heart or kidney disease.  How do carbohydrates affect me? Carbohydrates affect your blood glucose level more than any other type of food. Eating carbohydrates naturally increases the amount of glucose in your blood. Carbohydrate counting is a method for keeping track of how many carbohydrates you eat. Counting carbohydrates is important to keep  your blood glucose at a healthy level, especially if you use insulin or take certain oral diabetes medicines. It is important to know how many carbohydrates you can safely have in each meal. This is different for every person. Your dietitian can help you calculate how many carbohydrates you should have at each meal and for snack. Foods that contain carbohydrates include:  Bread, cereal, rice, pasta, and crackers.  Potatoes and corn.  Peas, beans, and lentils.  Milk and yogurt.  Fruit and juice.  Desserts, such as cakes, cookies, ice cream, and candy.  How does alcohol affect me? Alcohol can cause a sudden decrease in blood glucose (hypoglycemia), especially if you use insulin or take certain oral diabetes medicines. Hypoglycemia can be a life-threatening condition. Symptoms of hypoglycemia (sleepiness, dizziness, and confusion) are similar to symptoms of having too much alcohol. If your health care provider says that alcohol is safe for you, follow these guidelines:  Limit alcohol intake to no more than 1 drink per day for nonpregnant women and 2 drinks per day for men. One drink equals 12 oz of beer, 5 oz of wine, or 1 oz of hard liquor.  Do not drink on an empty stomach.  Keep yourself hydrated with water, diet soda, or unsweetened iced tea.  Keep in mind that regular soda, juice, and other mixers may contain a lot of sugar and must be counted as carbohydrates.  What are tips for following this plan? Reading food labels  Start by checking the serving size on the label. The amount  of calories, carbohydrates, fats, and other nutrients listed on the label are based on one serving of the food. Many foods contain more than one serving per package.  Check the total grams (g) of carbohydrates in one serving. You can calculate the number of servings of carbohydrates in one serving by dividing the total carbohydrates by 15. For example, if a food has 30 g of total carbohydrates, it would  be equal to 2 servings of carbohydrates.  Check the number of grams (g) of saturated and trans fats in one serving. Choose foods that have low or no amount of these fats.  Check the number of milligrams (mg) of sodium in one serving. Most people should limit total sodium intake to less than 2,300 mg per day.  Always check the nutrition information of foods labeled as "low-fat" or "nonfat". These foods may be higher in added sugar or refined carbohydrates and should be avoided.  Talk to your dietitian to identify your daily goals for nutrients listed on the label. Shopping  Avoid buying canned, premade, or processed foods. These foods tend to be high in fat, sodium, and added sugar.  Shop around the outside edge of the grocery store. This includes fresh fruits and vegetables, bulk grains, fresh meats, and fresh dairy. Cooking  Use low-heat cooking methods, such as baking, instead of high-heat cooking methods like deep frying.  Cook using healthy oils, such as olive, canola, or sunflower oil.  Avoid cooking with butter, cream, or high-fat meats. Meal planning  Eat meals and snacks regularly, preferably at the same times every day. Avoid going long periods of time without eating.  Eat foods high in fiber, such as fresh fruits, vegetables, beans, and whole grains. Talk to your dietitian about how many servings of carbohydrates you can eat at each meal.  Eat 4-6 ounces of lean protein each day, such as lean meat, chicken, fish, eggs, or tofu. 1 ounce is equal to 1 ounce of meat, chicken, or fish, 1 egg, or 1/4 cup of tofu.  Eat some foods each day that contain healthy fats, such as avocado, nuts, seeds, and fish. Lifestyle   Check your blood glucose regularly.  Exercise at least 30 minutes 5 or more days each week, or as told by your health care provider.  Take medicines as told by your health care provider.  Do not use any products that contain nicotine or tobacco, such as  cigarettes and e-cigarettes. If you need help quitting, ask your health care provider.  Work with a Social worker or diabetes educator to identify strategies to manage stress and any emotional and social challenges. What are some questions to ask my health care provider?  Do I need to meet with a diabetes educator?  Do I need to meet with a dietitian?  What number can I call if I have questions?  When are the best times to check my blood glucose? Where to find more information:  American Diabetes Association: diabetes.org/food-and-fitness/food  Academy of Nutrition and Dietetics: PokerClues.dk  [employer] of Diabetes and Digestive and Kidney Diseases (NIH): ContactWire.be Summary  A healthy meal plan will help you control your blood glucose and maintain a healthy lifestyle.  Working with a diet and nutrition specialist (dietitian) can help you make a meal plan that is best for you.  Keep in mind that carbohydrates and alcohol have immediate effects on your blood glucose levels. It is important to count carbohydrates and to use alcohol carefully. This information is not intended  to replace advice given to you by your health care provider. Make sure you discuss any questions you have with your health care provider. Document Released: 12/23/2004 Document Revised: 05/02/2016 Document Reviewed: 05/02/2016 Elsevier Interactive Patient Education  [employer].

## 2017-05-10 LAB — CBC WITH DIFFERENTIAL/PLATELET
BASOS: 0 %
Basophils Absolute: 0 10*3/uL (ref 0.0–0.2)
EOS (ABSOLUTE): 0.1 10*3/uL (ref 0.0–0.4)
EOS: 1 %
HEMATOCRIT: 36 % — AB (ref 37.5–51.0)
Hemoglobin: 11.2 g/dL — ABNORMAL LOW (ref 13.0–17.7)
IMMATURE GRANS (ABS): 0 10*3/uL (ref 0.0–0.1)
IMMATURE GRANULOCYTES: 1 %
LYMPHS: 46 %
Lymphocytes Absolute: 1.8 10*3/uL (ref 0.7–3.1)
MCH: 27.4 pg (ref 26.6–33.0)
MCHC: 31.1 g/dL — ABNORMAL LOW (ref 31.5–35.7)
MCV: 88 fL (ref 79–97)
MONOCYTES: 6 %
MONOS ABS: 0.2 10*3/uL (ref 0.1–0.9)
NEUTROS PCT: 46 %
Neutrophils Absolute: 1.8 10*3/uL (ref 1.4–7.0)
Platelets: 269 10*3/uL (ref 150–379)
RBC: 4.09 x10E6/uL — AB (ref 4.14–5.80)
RDW: 14.2 % (ref 12.3–15.4)
WBC: 3.9 10*3/uL (ref 3.4–10.8)

## 2017-05-10 LAB — PSA: Prostate Specific Ag, Serum: 0.3 ng/mL (ref 0.0–4.0)

## 2017-05-10 LAB — COMPREHENSIVE METABOLIC PANEL
ALK PHOS: 57 IU/L (ref 39–117)
ALT: 22 IU/L (ref 0–44)
AST: 22 IU/L (ref 0–40)
Albumin/Globulin Ratio: 1.3 (ref 1.2–2.2)
Albumin: 4.3 g/dL (ref 3.5–5.5)
BUN/Creatinine Ratio: 10 (ref 9–20)
BUN: 13 mg/dL (ref 6–24)
Bilirubin Total: 0.4 mg/dL (ref 0.0–1.2)
CALCIUM: 9.7 mg/dL (ref 8.7–10.2)
CO2: 24 mmol/L (ref 20–29)
CREATININE: 1.26 mg/dL (ref 0.76–1.27)
Chloride: 97 mmol/L (ref 96–106)
GFR calc Af Amer: 77 mL/min/{1.73_m2} (ref >59)
GFR, EST NON AFRICAN AMERICAN: 67 mL/min/{1.73_m2} (ref >59)
GLUCOSE: 230 mg/dL — AB (ref 65–99)
Globulin, Total: 3.4 g/dL (ref 1.5–4.5)
Potassium: 4.8 mmol/L (ref 3.5–5.2)
Sodium: 136 mmol/L (ref 134–144)
Total Protein: 7.7 g/dL (ref 6.0–8.5)

## 2017-05-11 ENCOUNTER — Encounter: Payer: Self-pay | Admitting: Family Medicine

## 2017-05-11 NOTE — Progress Notes (Signed)
Mail lab results letter.

## 2017-05-18 ENCOUNTER — Encounter: Payer: Self-pay | Admitting: Family Medicine

## 2017-05-18 ENCOUNTER — Encounter (HOSPITAL_COMMUNITY): Payer: Self-pay | Admitting: *Deleted

## 2017-05-18 ENCOUNTER — Telehealth: Payer: Self-pay | Admitting: Family Medicine

## 2017-05-18 NOTE — Telephone Encounter (Signed)
Please contact pharmacy,[facility] to ensure patient picked-up Troy Rush (diabetes medication). I do not see that we received a PA on the medication and I wasn't sure if medication would be covered.  Carroll Sage. Kenton Kingfisher, MSN, FNP-C [facility]  [address] [place], [place] [address] [phone]

## 2017-05-18 NOTE — Telephone Encounter (Signed)
Patient has not pick up script and it doesn't require a authorization.

## 2017-05-18 NOTE — Progress Notes (Signed)
Received record request from [place] on 05/17/17.  Requested records faxed today to [phone].  Original request will be scanned to patient's electronic medical record.

## 2017-05-22 ENCOUNTER — Telehealth: Payer: Self-pay

## 2017-05-23 DIAGNOSIS — H40013 Open angle with borderline findings, low risk, bilateral: Secondary | ICD-10-CM | POA: Diagnosis not present

## 2017-05-23 DIAGNOSIS — E119 Type 2 diabetes mellitus without complications: Secondary | ICD-10-CM | POA: Diagnosis not present

## 2017-05-23 DIAGNOSIS — H2513 Age-related nuclear cataract, bilateral: Secondary | ICD-10-CM | POA: Diagnosis not present

## 2017-05-23 DIAGNOSIS — H25013 Cortical age-related cataract, bilateral: Secondary | ICD-10-CM | POA: Diagnosis not present

## 2017-06-05 ENCOUNTER — Other Ambulatory Visit: Payer: Self-pay

## 2017-06-05 ENCOUNTER — Other Ambulatory Visit (HOSPITAL_COMMUNITY): Payer: Self-pay | Admitting: *Deleted

## 2017-06-05 DIAGNOSIS — E119 Type 2 diabetes mellitus without complications: Secondary | ICD-10-CM

## 2017-06-05 MED ORDER — ATORVASTATIN CALCIUM 80 MG PO TABS: 80.0000 mg | ORAL_TABLET | Freq: Every day | ORAL | 3 refills | Status: DC

## 2017-06-05 MED ORDER — FUROSEMIDE 20 MG PO TABS: 20.0000 mg | ORAL_TABLET | Freq: Every day | ORAL | 3 refills | Status: DC

## 2017-06-14 ENCOUNTER — Encounter: Payer: Self-pay | Admitting: Family Medicine

## 2017-06-19 ENCOUNTER — Encounter (HOSPITAL_COMMUNITY): Payer: Self-pay | Admitting: Cardiology

## 2017-06-19 NOTE — Telephone Encounter (Signed)
Will have Dr.McLean discuss with patient during tomorrows scheduled office visit.

## 2017-06-20 ENCOUNTER — Other Ambulatory Visit: Payer: Self-pay

## 2017-06-20 ENCOUNTER — Encounter (HOSPITAL_COMMUNITY): Payer: Self-pay | Admitting: Cardiology

## 2017-06-20 ENCOUNTER — Telehealth (HOSPITAL_COMMUNITY): Payer: Self-pay | Admitting: Pharmacist

## 2017-06-20 ENCOUNTER — Ambulatory Visit (HOSPITAL_COMMUNITY)
Admission: RE | Admit: 2017-06-20 | Discharge: 2017-06-20 | Disposition: A | Discharge status: Home / Self Care | Payer: Medicare Other | Source: Ambulatory Visit | Attending: Cardiology | Admitting: Cardiology

## 2017-06-20 VITALS — BP 152/84 | HR 77 | Wt 232.5 lb

## 2017-06-20 DIAGNOSIS — Z7982 Long term (current) use of aspirin: Secondary | ICD-10-CM | POA: Insufficient documentation

## 2017-06-20 DIAGNOSIS — H9190 Unspecified hearing loss, unspecified ear: Secondary | ICD-10-CM | POA: Diagnosis not present

## 2017-06-20 DIAGNOSIS — I5022 Chronic systolic (congestive) heart failure: Secondary | ICD-10-CM | POA: Diagnosis not present

## 2017-06-20 DIAGNOSIS — Z95810 Presence of automatic (implantable) cardiac defibrillator: Secondary | ICD-10-CM | POA: Diagnosis not present

## 2017-06-20 DIAGNOSIS — Z794 Long term (current) use of insulin: Secondary | ICD-10-CM | POA: Insufficient documentation

## 2017-06-20 DIAGNOSIS — I10 Essential (primary) hypertension: Secondary | ICD-10-CM | POA: Diagnosis not present

## 2017-06-20 DIAGNOSIS — E785 Hyperlipidemia, unspecified: Secondary | ICD-10-CM | POA: Diagnosis not present

## 2017-06-20 DIAGNOSIS — E119 Type 2 diabetes mellitus without complications: Secondary | ICD-10-CM | POA: Insufficient documentation

## 2017-06-20 DIAGNOSIS — I110 Hypertensive heart disease with heart failure: Secondary | ICD-10-CM | POA: Diagnosis not present

## 2017-06-20 DIAGNOSIS — Z79899 Other long term (current) drug therapy: Secondary | ICD-10-CM | POA: Diagnosis not present

## 2017-06-20 LAB — BASIC METABOLIC PANEL
ANION GAP: 13 (ref 5–15)
BUN: 17 mg/dL (ref 6–20)
CALCIUM: 9.6 mg/dL (ref 8.9–10.3)
CO2: 24 mmol/L (ref 22–32)
Chloride: 99 mmol/L — ABNORMAL LOW (ref 101–111)
Creatinine, Ser: 1.22 mg/dL (ref 0.61–1.24)
GFR calc Af Amer: >60 mL/min (ref >60)
GLUCOSE: 172 mg/dL — AB (ref 65–99)
Potassium: 5.1 mmol/L (ref 3.5–5.1)
SODIUM: 136 mmol/L (ref 135–145)

## 2017-06-20 LAB — DIGOXIN LEVEL

## 2017-06-20 MED ORDER — SACUBITRIL-VALSARTAN 97-103 MG PO TABS: 1.0000 | ORAL_TABLET | Freq: Two times a day (BID) | ORAL | 3 refills | Status: DC

## 2017-06-20 NOTE — Telephone Encounter (Signed)
Entresto 97-103 mg BID PA approved by [employer] Part D through 06/20/19.   Ruta Hinds. Velva Harman, PharmD, BCPS, CPP Clinical Pharmacist Phone: [phone] 06/20/2017 10:47 AM

## 2017-06-20 NOTE — Patient Instructions (Signed)
INCREASE Entresto to 97/103mg  twice daily.  Routine lab work today. Will notify you of abnormal results  Repeat labs (bmet)i n10 days.  Follow up and Echo with Dr.McLean in May.

## 2017-06-21 NOTE — Progress Notes (Signed)
Advanced Heart Failure Clinic Note   PCP: Cammie Sickle Cardiology: Dr. Aundra Dubin  47 y.o. with deafness, HTN, DM, and chronic systolic CHF presents for followup of CHF.  Patient had no known cardiac problems until 10/16.  At that time, he developed exertional dyspnea and was admitted to a hospital in [place], [place] with acute systolic CHF.  Echo 10/16 showed EF 15-20%.  Cardiac cath showed no significant coronary disease.  He was diuresed and started on cardiac meds. Subsequently, he moved to [facility].  Now working in a [employer].  Repeat echo in 5/18 showed persistently low EF, 25-30%.  He had [place] placed.   He returns for regular followup. Sign language interpreter present.  He is taking all his meds.  Says he is able to walk 2 miles without dyspnea. Continues to work in the [employer]. No orthopnea/PND. No chest pain.  Weight is stable. BP is elevated today.   [employer] Heartlogic HF index = 0.   Labs (9/17): BNP 477, K 4.5 => 4.2, creatinine 0.92 => 1.1, BNP 1009, HIV negative, SPEP negative.  Labs (10/17): K 5.2, creatinine 1.10, hgb 12.5 Labs (03/28/2016): K 5.3 Creatinine 1.07  Labs (3/18): digoxin < 0.2, K 4.1, creatinine 0.97 Labs (9/18): hgb 12.2, K 4, creatinine 0.99, BNP 139 Labs (1/19): hgb 11.2, K 4.8, creatinine 1.26  PMH: 1. HTN 2. Diabetes 3. Hyperlipidemia 4. Deafness since age 5: Needs sign language interpreter.  5. Chronic systolic CHF: Nonischemic cardiomyopathy.  Diagnosis 10/16 in [place], [place] (admitted with acute systolic CHF).  SPEP and HIV negative.  [place].  - Echo (10/16): EF 15-20%, mild LVH, mild MR, normal RV size and systolic function.  - LHC (10/16): No significant CAD.  - CPX (12/17): Mild functional limitation.  - 04/2016 CMRI: EF 27%. RV normal. No evidence of infiltrative disease, myocarditis, or MI.  - Echo (5/18): EF 25-30%, mild LV dilation, mild LVH, normal RV size with mildly decreased 6.  Depression. 7. Colitis episodes.   SH: From [place] originally.  Lives in [place] until 2017, then moved to [place].  Nonsmoker.  Used to drink 3-4 ETOH beverages/day until 10/16, now stopped. Works in [employer].   FH: No known cardiac disease.   ROS: All systems reviewed and negative except as per HPI.   Current Outpatient Medications  Medication Sig Dispense Refill  . aspirin 81 MG chewable tablet Chew 81 mg by mouth daily.    Marland Kitchen atorvastatin (LIPITOR) 80 MG tablet Take 1 tablet (80 mg total) by mouth daily. 90 tablet 3  . blood glucose meter kit and supplies KIT Dispense based on patient and insurance preference. Use up to four times daily as directed. (FOR ICD-9 250.00, 250.01). 1 each 0  . Blood Glucose Monitoring Suppl (ACCU-CHEK AVIVA PLUS) w/Device KIT 1 each by Does not apply route 4 (four) times daily -  before meals and at bedtime. E.11.8 1 kit 0  . carvedilol (COREG) 12.5 MG tablet Take 1.5 tablets (18.75 mg total) by mouth 2 (two) times daily. 240 tablet 3  . citalopram (CELEXA) 40 MG tablet Take 1 tablet (40 mg total) by mouth daily. 90 tablet 1  . dapagliflozin propanediol (FARXIGA) 5 MG TABS tablet Take 5 mg by mouth daily. 90 tablet 1  . digoxin (LANOXIN) 0.125 MG tablet Take 1 tablet (0.125 mg total) by mouth daily. 90 tablet 3  . furosemide (LASIX) 20 MG tablet Take 1 tablet (20 mg total) by mouth daily. [place]  tablet 3  . glucose blood (ACCU-CHEK AVIVA PLUS) test strip Check blood three times per day and at bedtime 200 each 12  . hydrALAZINE (APRESOLINE) 50 MG tablet Take 1 tablet (50 mg total) by mouth 3 (three) times daily. 270 tablet 2  . ibuprofen (ADVIL,MOTRIN) 400 MG tablet Take 1 tablet (400 mg total) by mouth every 6 (six) hours as needed. 30 tablet 0  . Insulin Glargine (LANTUS SOLOSTAR) 100 UNIT/ML Solostar Pen Inject 35 Units into the skin daily at 10 pm. 15 pen 3  . INSULIN SYRINGE .5CC/29G (B-D INS SYR ULTRAFINE .5CC/29G) 29G X 1/2" 0.5 ML MISC 1 each by Does not  apply route 3 (three) times daily before meals. 200 each 2  . isosorbide mononitrate (IMDUR) 30 MG 24 hr tablet Take 1 tablet (30 mg total) by mouth daily. 90 tablet 3  . ivabradine (CORLANOR) 5 MG TABS tablet Take 1 tablet (5 mg total) by mouth 2 (two) times daily with a meal. 180 tablet 3  . Lancet Devices (ACCU-CHEK SOFTCLIX) lancets Use as instructed 1 each 0  . metFORMIN (GLUCOPHAGE) 1000 MG tablet Take 1 tablet (1,000 mg total) by mouth 2 (two) times daily with a meal. 180 tablet 3  . promethazine (PHENERGAN) 25 MG tablet Take 1 tablet (25 mg total) by mouth every 6 (six) hours as needed for nausea or vomiting. 30 tablet 0  . spironolactone (ALDACTONE) 25 MG tablet Take 1 tablet (25 mg total) by mouth daily. 90 tablet 3  . sacubitril-valsartan (ENTRESTO) 97-103 MG Take 1 tablet by mouth 2 (two) times daily. 60 tablet 3   No current facility-administered medications for this encounter.    BP (!) 152/84   Pulse 77   Wt 232 lb 8 oz (105.5 kg)   SpO2 100%   BMI 32.43 kg/m    Wt Readings from Last 3 Encounters:  06/20/17 232 lb 8 oz (105.5 kg)  05/09/17 234 lb (106.1 kg)  02/06/17 231 lb 12.8 oz (105.1 kg)    General: NAD Neck: No JVD, no thyromegaly or thyroid nodule.  Lungs: Clear to auscultation bilaterally with normal respiratory effort. CV: Nondisplaced PMI.  Heart regular S1/S2, no S3/S4, no murmur.  Trace ankle edema.  No carotid bruit.  Normal pedal pulses.  Abdomen: Soft, nontender, no hepatosplenomegaly, no distention.  Skin: Intact without lesions or rashes.  Neurologic: Alert and oriented x 3.  Psych: Normal affect. Extremities: No clubbing or cyanosis.  HEENT: Normal.   Assessment/Plan: 1. Chronic systolic CHF: Nonischemic cardiomyopathy, etiology uncertain. Consider prior ETOH or HTN.  He has a [place].  CMRI in 1/18 with EF 27%, RV normal, no evidence of infiltrative disease, myocarditis, or MI.  Echo was done in 5/18, EF remained 25-30%.  He is not  volume overloaded on exam or by Heartlogic assessment. NYHA class II.  - Continue lasix 20 mg daily. BMET today. - Continue Coreg 12.5 mg BID - Increase Entresto to 97/103 bid, BMET in 10 days.      - Continue spironolactone 25 mg daily.   - Continue hydralazine/nitrates and Corlanor at current doses. - Continue digoxin 0.125 daily, check digoxin level today.  - I will get a repeat echo in 5/19.  2. Type II diabetes: He is on dapagliflozin.  3. HTN: BP elevated, increasing Entresto.   4. Deafness: Sign language interpreter present at visit.  5. Hyperkalemia: Resolved.  He is now off [place].  Will assess K today.   Followup in 5/19  with echo.   Loralie Champagne, MD  06/21/2017

## 2017-06-30 ENCOUNTER — Other Ambulatory Visit (HOSPITAL_COMMUNITY): Payer: Medicare Other

## 2017-06-30 NOTE — Telephone Encounter (Signed)
Message not needed. °

## 2017-07-12 ENCOUNTER — Other Ambulatory Visit (HOSPITAL_COMMUNITY): Payer: Self-pay | Admitting: *Deleted

## 2017-07-12 MED ORDER — DIGOXIN 125 MCG PO TABS: 0.1250 mg | ORAL_TABLET | Freq: Every day | ORAL | 3 refills | Status: DC

## 2017-07-18 ENCOUNTER — Other Ambulatory Visit: Payer: Self-pay | Admitting: Cardiology

## 2017-07-21 ENCOUNTER — Encounter: Payer: Self-pay | Admitting: Family Medicine

## 2017-07-21 ENCOUNTER — Other Ambulatory Visit: Payer: Self-pay

## 2017-07-21 MED ORDER — METFORMIN HCL 1000 MG PO TABS: 1000.0000 mg | ORAL_TABLET | Freq: Two times a day (BID) | ORAL | 0 refills | Status: DC

## 2017-07-21 NOTE — Telephone Encounter (Signed)
Metformin Refilled

## 2017-07-25 ENCOUNTER — Ambulatory Visit (INDEPENDENT_AMBULATORY_CARE_PROVIDER_SITE_OTHER): Payer: Medicare Other | Admitting: *Deleted

## 2017-07-25 DIAGNOSIS — I428 Other cardiomyopathies: Secondary | ICD-10-CM | POA: Diagnosis not present

## 2017-07-25 NOTE — Progress Notes (Signed)
Remote ICD transmission.   

## 2017-07-27 ENCOUNTER — Encounter: Payer: Self-pay | Admitting: Cardiology

## 2017-08-03 ENCOUNTER — Other Ambulatory Visit: Payer: Self-pay

## 2017-08-03 ENCOUNTER — Emergency Department (HOSPITAL_COMMUNITY)
Admission: EM | Admit: 2017-08-03 | Discharge: 2017-08-03 | Disposition: A | Discharge status: Home / Self Care | Payer: Medicare Other | Attending: Emergency Medicine | Admitting: Emergency Medicine

## 2017-08-03 ENCOUNTER — Encounter (HOSPITAL_COMMUNITY): Payer: Self-pay | Admitting: Emergency Medicine

## 2017-08-03 DIAGNOSIS — I110 Hypertensive heart disease with heart failure: Secondary | ICD-10-CM | POA: Insufficient documentation

## 2017-08-03 DIAGNOSIS — L0100 Impetigo, unspecified: Secondary | ICD-10-CM | POA: Diagnosis not present

## 2017-08-03 DIAGNOSIS — I5022 Chronic systolic (congestive) heart failure: Secondary | ICD-10-CM | POA: Insufficient documentation

## 2017-08-03 DIAGNOSIS — E119 Type 2 diabetes mellitus without complications: Secondary | ICD-10-CM | POA: Diagnosis not present

## 2017-08-03 DIAGNOSIS — L03211 Cellulitis of face: Secondary | ICD-10-CM | POA: Diagnosis not present

## 2017-08-03 DIAGNOSIS — M545 Low back pain, unspecified: Secondary | ICD-10-CM

## 2017-08-03 DIAGNOSIS — R600 Localized edema: Secondary | ICD-10-CM | POA: Diagnosis present

## 2017-08-03 DIAGNOSIS — Z794 Long term (current) use of insulin: Secondary | ICD-10-CM | POA: Insufficient documentation

## 2017-08-03 DIAGNOSIS — Z7982 Long term (current) use of aspirin: Secondary | ICD-10-CM | POA: Insufficient documentation

## 2017-08-03 MED ORDER — CEPHALEXIN 500 MG PO CAPS: 500.0000 mg | ORAL_CAPSULE | Freq: Four times a day (QID) | ORAL | 0 refills | Status: DC

## 2017-08-03 MED ORDER — METHOCARBAMOL 500 MG PO TABS: 500.0000 mg | ORAL_TABLET | Freq: Two times a day (BID) | ORAL | 0 refills | Status: DC

## 2017-08-03 MED ORDER — MUPIROCIN CALCIUM 2 % EX CREA: 1.0000 "application " | TOPICAL_CREAM | Freq: Two times a day (BID) | CUTANEOUS | 0 refills | Status: DC

## 2017-08-03 MED ORDER — IBUPROFEN 400 MG PO TABS: 400.0000 mg | ORAL_TABLET | Freq: Four times a day (QID) | ORAL | 0 refills | Status: DC | PRN
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
## 2017-08-03 NOTE — Discharge Instructions (Signed)
Medications: Mupirocin, Keflex, Robaxin, ibuprofen  Treatment: Apply mupirocin ointment as prescribed for 1 week.  Take Keflex until completed.  Take Robaxin twice daily as needed for muscle pain or spasms.  Do not drive or operate machinery while taking this medication.  Take ibuprofen every 4-6 hours as needed for your pain.   use ice and heat alternating 20 minutes on, 20 minutes off.  Attempt the exercises and stretches a few times daily as tolerated.  Follow-up: Please follow-up with your doctor as scheduled on Monday for recheck.  Please return to the emergency department if you develop any new or worsening symptoms.

## 2017-08-07 ENCOUNTER — Ambulatory Visit (INDEPENDENT_AMBULATORY_CARE_PROVIDER_SITE_OTHER): Payer: Medicare Other | Admitting: Family Medicine

## 2017-08-07 ENCOUNTER — Encounter: Payer: Self-pay | Admitting: Family Medicine

## 2017-08-07 VITALS — BP 114/63 | HR 77 | Temp 97.7°F | Resp 16 | Ht 71.0 in | Wt 234.0 lb

## 2017-08-07 DIAGNOSIS — H9193 Unspecified hearing loss, bilateral: Secondary | ICD-10-CM | POA: Diagnosis not present

## 2017-08-07 DIAGNOSIS — I10 Essential (primary) hypertension: Secondary | ICD-10-CM

## 2017-08-07 DIAGNOSIS — E785 Hyperlipidemia, unspecified: Secondary | ICD-10-CM | POA: Diagnosis not present

## 2017-08-07 DIAGNOSIS — Z23 Encounter for immunization: Secondary | ICD-10-CM | POA: Diagnosis not present

## 2017-08-07 DIAGNOSIS — E119 Type 2 diabetes mellitus without complications: Secondary | ICD-10-CM

## 2017-08-07 LAB — POCT URINALYSIS DIPSTICK
Glucose, UA: 500
LEUKOCYTES UA: NEGATIVE
NITRITE UA: NEGATIVE
PH UA: 5.5 (ref 5.0–8.0)
PROTEIN UA: 30
RBC UA: NEGATIVE
SPEC GRAV UA: 1.02 (ref 1.010–1.025)
UROBILINOGEN UA: 0.2 U/dL

## 2017-08-07 LAB — CUP PACEART REMOTE DEVICE CHECK
Battery Remaining Longevity: 180 mo
HighPow Impedance: 68 Ohm
Implantable Lead Implant Date: 20180706
Implantable Lead Location: 753860
Implantable Lead Serial Number: 432676
Implantable Pulse Generator Implant Date: 20180706
Lead Channel Impedance Value: 457 Ohm
Lead Channel Setting Sensing Sensitivity: 0.5 mV
MDC IDC MSMT LEADCHNL RV SENSING INTR AMPL: 20.7 mV
MDC IDC PG SERIAL: 236570
MDC IDC SESS DTM: 20190429162046
MDC IDC SET LEADCHNL RV PACING AMPLITUDE: 2.5 V
MDC IDC SET LEADCHNL RV PACING PULSEWIDTH: 0.4 ms
MDC IDC STAT BRADY RV PERCENT PACED: 0 %

## 2017-08-07 LAB — GLUCOSE, POCT (MANUAL RESULT ENTRY): POC GLUCOSE: 112 mg/dL — AB (ref 70–99)

## 2017-08-07 LAB — POCT GLYCOSYLATED HEMOGLOBIN (HGB A1C): Hemoglobin A1C: 9

## 2017-08-07 MED ORDER — METFORMIN HCL 1000 MG PO TABS: 1000.0000 mg | ORAL_TABLET | Freq: Two times a day (BID) | ORAL | 3 refills | Status: DC

## 2017-08-07 MED ORDER — DAPAGLIFLOZIN PROPANEDIOL 5 MG PO TABS: 5.0000 mg | ORAL_TABLET | Freq: Every day | ORAL | 1 refills | Status: DC

## 2017-08-07 NOTE — Progress Notes (Signed)
Subjective:    Patient ID: Troy Rush, male    DOB: [birth year], 47 y.o.   MRN: [id number] [name], a 47 year old hearing impaired male that  presents accompanied by an interpreter for a follow up of type 2 diabetes mellitus.  [name] is preparing for a visit to [place] in 2 weeks. He says that he will be there for 3 months. He is requesting 90 day prescriptions for all chronic medications. Patient's most recent hemoglobin a1C was 9.1. He says that he misses medications sometimes and it's typically due to work schedule. He also does not exercise routinely. .  Diabetes  He presents for his follow-up diabetic visit. He has type 2 diabetes mellitus. His disease course has been worsening. Pertinent negatives for hypoglycemia include no confusion, dizziness, headaches, hunger, mood changes, nervousness/anxiousness, pallor, seizures, sleepiness, speech difficulty, sweats or tremors. Pertinent negatives for diabetes include no blurred vision, no chest pain, no fatigue, no foot paresthesias, no foot ulcerations, no polydipsia, no polyphagia, no polyuria, no visual change, no weakness and no weight loss. Pertinent negatives for hypoglycemia complications include no hospitalization. Pertinent negatives for diabetic complications include no CVA or heart disease. Risk factors for coronary artery disease include diabetes mellitus and hypertension. Current diabetic treatment includes diet and oral agent (monotherapy). He is compliant with treatment most of the time. His weight is stable. He is following a high fat/cholesterol diet. When asked about meal planning, he reported none. He has not had a previous visit with a dietitian. He rarely participates in exercise. An ACE inhibitor/angiotensin II receptor blocker is not being taken. He does not see a podiatrist.Eye exam is not current.  Hypertension  Pertinent negatives include no anxiety, blurred vision, chest pain, headaches, malaise/fatigue, neck pain,  orthopnea, palpitations, peripheral edema, PND, shortness of breath or sweats. Past treatments include calcium channel blockers and central alpha agonists. Compliance problems include exercise and medication cost.  There is no history of CVA.   Past Medical History:  Diagnosis Date  . AICD (automatic cardioverter/defibrillator) present 10/14/2016  . Bronchitis   . CHF (congestive heart failure) ([place])   . Depression   . Diabetes mellitus without complication ([place])   . Dyslipidemia   . Hypertension   . NICM (nonischemic cardiomyopathy) ([place]) 09/15/2016  . Pancreatitis    Social History   Socioeconomic History  . Marital status: Legally Separated    Spouse name: Not on file  . Number of children: Not on file  . Years of education: Not on file  . Highest education level: Not on file  Occupational History  . Not on file  Social Needs  . Financial resource strain: Not on file  . Food insecurity:    Worry: Not on file    Inability: Not on file  . Transportation needs:    Medical: Not on file    Non-medical: Not on file  Tobacco Use  . Smoking status: Never Smoker  . Smokeless tobacco: Never Used  Substance and Sexual Activity  . Alcohol use: Yes    Comment: rare  . Drug use: No  . Sexual activity: Not on file  Lifestyle  . Physical activity:    Days per week: Not on file    Minutes per session: Not on file  . Stress: Not on file  Relationships  . Social connections:    Talks on phone: Not on file    Gets together: Not on file    Attends religious service: Not on  file    Active member of club or organization: Not on file    Attends meetings of clubs or organizations: Not on file    Relationship status: Not on file  . Intimate partner violence:    Fear of current or ex partner: Not on file    Emotionally abused: Not on file    Physically abused: Not on file    Forced sexual activity: Not on file  Other Topics Concern  . Not on file  Social History Narrative  . Not on  file   Immunization History  Administered Date(s) Administered  . Influenza, Seasonal, Injecte, Preservative Fre 05/14/2012, 04/15/2013  . Influenza,inj,Quad PF,6+ Mos 03/21/2016, 12/13/2016  . Influenza,inj,quad, With Preservative 01/24/2015  . Pneumococcal Polysaccharide-23 10/12/2011, 06/29/2016  . Tdap 09/11/2006, 06/29/2016, 01/18/2017   Review of Systems  Constitutional: Negative.  Negative for fatigue, malaise/fatigue and weight loss.  HENT: Negative.   Eyes: Negative.  Negative for blurred vision.  Respiratory: Negative.  Negative for shortness of breath.   Cardiovascular: Negative.  Negative for chest pain, palpitations, orthopnea and PND.  Gastrointestinal: Negative.   Endocrine: Negative for polydipsia, polyphagia and polyuria.  Musculoskeletal: Negative.  Negative for neck pain.  Skin: Negative.  Negative for pallor.  Neurological: Positive for numbness. Negative for dizziness, tremors, seizures, speech difficulty, weakness and headaches.  Hematological: Negative.   Psychiatric/Behavioral: Negative.  Negative for confusion. The patient is not nervous/anxious.        Objective:   Physical Exam  Constitutional: He is oriented to person, place, and time. He appears well-developed and well-nourished.  HENT:  Head: Normocephalic and atraumatic.  Right Ear: External ear normal.  Left Ear: External ear normal.  Mouth/Throat: Oropharynx is clear and moist.  Eyes: Pupils are equal, round, and reactive to light. Conjunctivae and EOM are normal.  Neck: Normal range of motion. Neck supple.  Pulmonary/Chest: Effort normal and breath sounds normal.  Abdominal: Soft. Bowel sounds are normal.  Neurological: He is alert and oriented to person, place, and time. He has normal reflexes.  Skin: Skin is warm and dry.  Psychiatric: He has a normal mood and affect. His behavior is normal. Judgment and thought content normal.      BP 114/63 (BP Location: Left Arm, Patient Position:  Sitting, Cuff Size: Large)   Pulse 77   Temp 97.7 F (36.5 C) (Oral)   Resp 16   Ht 5\' 11"  (1.803 m)   Wt 234 lb (106.1 kg)   SpO2 100%   BMI 32.64 kg/m  Assessment & Plan:  1. Type 2 diabetes mellitus without complication, without long-term current use of insulin (HCC) Hemoglobin A1c is 9.9 which is above goal.  Goal is less than seven.  Discussed the importance of taking medications consistently in order to achieve positive outcomes Recommend a carbohydrate modify diet divided over small meals throughout the day.  Also, increase water intake to 3-4 bottles per day.  Recommend increasing daily activity level - HgB A1c - Glucose (CBG) - dapagliflozin propanediol (FARXIGA) 5 MG TABS tablet; Take 5 mg by mouth daily.  Dispense: 90 tablet; Refill: 1 - metFORMIN (GLUCOPHAGE) 1000 MG tablet; Take 1 tablet (1,000 mg total) by mouth 2 (two) times daily with a meal.  Dispense: 180 tablet; Refill: 3 - Microalbumin/Creatinine Ratio, Urine  2. Essential hypertension Blood pressure is at goal on current medication regimen, no changes warranted on today.We have discussed target BP range and blood pressure goal. I have advised patient to check BP regularly  and to call us back or report to clinic if the numbers are consistently higher than 140/90. We discussed the importance of compliance with medical therapy and DASH diet recommended, consequences of uncontrolled hypertension discussed.  - continue current BP medications - HgB A1c - Glucose (CBG) - Urinalysis Dipstick - Basic Metabolic Panel - Microalbumin/Creatinine Ratio, Urine  3. Hyperlipidemia LDL goal <100 The 10-year ASCVD risk score Mikey Bussing DC Jr., et al., 2013) is: 12.5%   Values used to calculate the score:     Age: 89 years     Sex: Male     Is Non-Hispanic African American: Yes     Diabetic: Yes     Tobacco smoker: No     Systolic Blood Pressure: 391 mmHg     Is BP treated: Yes     HDL Cholesterol: 36 mg/dL     Total Cholesterol:  171 mg/dL  - Lipid Panel  4. Deaf, bilateral   Patient primarily uses sign language to communicate.     RTC: 3 months for chronic conditions   Donia Pounds  MSN, FNP-C [facility] [address] [place], [place] [address] [phone]

## 2017-08-07 NOTE — Patient Instructions (Addendum)
Your hemoglobin a1C is 9, No medication changes warranted on today.  Will continue Lantus at 35 units at bedtime.  Diabetes and Foot Care Diabetes may cause you to have problems because of poor blood supply (circulation) to your feet and legs. This may cause the skin on your feet to become thinner, break easier, and heal more slowly. Your skin may become dry, and the skin may peel and crack. You may also have nerve damage in your legs and feet causing decreased feeling in them. You may not notice minor injuries to your feet that could lead to infections or more serious problems. Taking care of your feet is one of the most important things you can do for yourself. Follow these instructions at home:  Wear shoes at all times, even in the house. Do not go barefoot. Bare feet are easily injured.  Check your feet daily for blisters, cuts, and redness. If you cannot see the bottom of your feet, use a mirror or ask someone for help.  Wash your feet with warm water (do not use hot water) and mild soap. Then pat your feet and the areas between your toes until they are completely dry. Do not soak your feet as this can dry your skin.  Apply a moisturizing lotion or petroleum jelly (that does not contain alcohol and is unscented) to the skin on your feet and to dry, brittle toenails. Do not apply lotion between your toes.  Trim your toenails straight across. Do not dig under them or around the cuticle. File the edges of your nails with an emery board or nail file.  Do not cut corns or calluses or try to remove them with medicine.  Wear clean socks or stockings every day. Make sure they are not too tight. Do not wear knee-high stockings since they may decrease blood flow to your legs.  Wear shoes that fit properly and have enough cushioning. To break in new shoes, wear them for just a few hours a day. This prevents you from injuring your feet. Always look in your shoes before you put them on to be sure there  are no objects inside.  Do not cross your legs. This may decrease the blood flow to your feet.  If you find a minor scrape, cut, or break in the skin on your feet, keep it and the skin around it clean and dry. These areas may be cleansed with mild soap and water. Do not cleanse the area with peroxide, alcohol, or iodine.  When you remove an adhesive bandage, be sure not to damage the skin around it.  If you have a wound, look at it several times a day to make sure it is healing.  Do not use heating pads or hot water bottles. They may burn your skin. If you have lost feeling in your feet or legs, you may not know it is happening until it is too late.  Make sure your health care provider performs a complete foot exam at least annually or more often if you have foot problems. Report any cuts, sores, or bruises to your health care provider immediately. Contact a health care provider if:  You have an injury that is not healing.  You have cuts or breaks in the skin.  You have an ingrown nail.  You notice redness on your legs or feet.  You feel burning or tingling in your legs or feet.  You have pain or cramps in your legs and feet.  Your legs or feet are numb.  Your feet always feel cold. Get help right away if:  There is increasing redness, swelling, or pain in or around a wound.  There is a red line that goes up your leg.  Pus is coming from a wound.  You develop a fever or as directed by your health care provider.  You notice a bad smell coming from an ulcer or wound. This information is not intended to replace advice given to you by your health care provider. Make sure you discuss any questions you have with your health care provider. Document Released: 03/25/2000 Document Revised: 09/03/2015 Document Reviewed: 09/04/2012 Elsevier Interactive Patient Education  2017 [employer].  Diabetes Mellitus and Exercise Exercising regularly is important for your overall health,  especially when you have diabetes (diabetes mellitus). Exercising is not only about losing weight. It has many health benefits, such as increasing muscle strength and bone density and reducing body fat and stress. This leads to improved fitness, flexibility, and endurance, all of which result in better overall health. Exercise has additional benefits for people with diabetes, including:  Reducing appetite.  Helping to lower and control blood glucose.  Lowering blood pressure.  Helping to control amounts of fatty substances (lipids) in the blood, such as cholesterol and triglycerides.  Helping the body to respond better to insulin (improving insulin sensitivity).  Reducing how much insulin the body needs.  Decreasing the risk for heart disease by: ? Lowering cholesterol and triglyceride levels. ? Increasing the levels of good cholesterol. ? Lowering blood glucose levels.  What is my activity plan? Your health care provider or certified diabetes educator can help you make a plan for the type and frequency of exercise (activity plan) that works for you. Make sure that you:  Do at least 150 minutes of moderate-intensity or vigorous-intensity exercise each week. This could be brisk walking, biking, or water aerobics. ? Do stretching and strength exercises, such as yoga or weightlifting, at least 2 times a week. ? Spread out your activity over at least 3 days of the week.  Get some form of physical activity every day. ? Do not go more than 2 days in a row without some kind of physical activity. ? Avoid being inactive for more than 90 minutes at a time. Take frequent breaks to walk or stretch.  Choose a type of exercise or activity that you enjoy, and set realistic goals.  Start slowly, and gradually increase the intensity of your exercise over time.  What do I need to know about managing my diabetes?  Check your blood glucose before and after exercising. ? If your blood glucose is  higher than 240 mg/dL (13.3 mmol/L) before you exercise, check your urine for ketones. If you have ketones in your urine, do not exercise until your blood glucose returns to normal.  Know the symptoms of low blood glucose (hypoglycemia) and how to treat it. Your risk for hypoglycemia increases during and after exercise. Common symptoms of hypoglycemia can include: ? Hunger. ? Anxiety. ? Sweating and feeling clammy. ? Confusion. ? Dizziness or feeling light-headed. ? Increased heart rate or palpitations. ? Blurry vision. ? Tingling or numbness around the mouth, lips, or tongue. ? Tremors or shakes. ? Irritability.  Keep a rapid-acting carbohydrate snack available before, during, and after exercise to help prevent or treat hypoglycemia.  Avoid injecting insulin into areas of the body that are going to be exercised. For example, avoid injecting insulin into: ? The  arms, when playing tennis. ? The legs, when jogging.  Keep records of your exercise habits. Doing this can help you and your health care provider adjust your diabetes management plan as needed. Write down: ? Food that you eat before and after you exercise. ? Blood glucose levels before and after you exercise. ? The type and amount of exercise you have done. ? When your insulin is expected to peak, if you use insulin. Avoid exercising at times when your insulin is peaking.  When you start a new exercise or activity, work with your health care provider to make sure the activity is safe for you, and to adjust your insulin, medicines, or food intake as needed.  Drink plenty of water while you exercise to prevent dehydration or heat stroke. Drink enough fluid to keep your urine clear or pale yellow. This information is not intended to replace advice given to you by your health care provider. Make sure you discuss any questions you have with your health care provider. Document Released: 06/18/2003 Document Revised: 10/16/2015 Document  Reviewed: 09/07/2015 Elsevier Interactive Patient Education  2018 [employer].

## 2017-08-08 ENCOUNTER — Telehealth: Payer: Self-pay

## 2017-08-08 LAB — BASIC METABOLIC PANEL
BUN / CREAT RATIO: 12 (ref 9–20)
BUN: 18 mg/dL (ref 6–24)
CHLORIDE: 96 mmol/L (ref 96–106)
CO2: 24 mmol/L (ref 20–29)
Calcium: 9.3 mg/dL (ref 8.7–10.2)
Creatinine, Ser: 1.56 mg/dL — ABNORMAL HIGH (ref 0.76–1.27)
GFR calc Af Amer: 60 mL/min/{1.73_m2} (ref >59)
GFR calc non Af Amer: 52 mL/min/{1.73_m2} — ABNORMAL LOW (ref >59)
GLUCOSE: 91 mg/dL (ref 65–99)
POTASSIUM: 4.5 mmol/L (ref 3.5–5.2)
Sodium: 134 mmol/L (ref 134–144)

## 2017-08-08 LAB — MICROALBUMIN / CREATININE URINE RATIO
CREATININE, UR: 231.5 mg/dL
Microalb/Creat Ratio: 29.8 mg/g creat (ref 0.0–30.0)
Microalbumin, Urine: 68.9 ug/mL

## 2017-08-08 LAB — LIPID PANEL
CHOL/HDL RATIO: 4.8 ratio (ref 0.0–5.0)
Cholesterol, Total: 171 mg/dL (ref 100–199)
HDL: 36 mg/dL — AB (ref >39)
LDL CALC: 94 mg/dL (ref 0–99)
Triglycerides: 206 mg/dL — ABNORMAL HIGH (ref 0–149)
VLDL CHOLESTEROL CAL: 41 mg/dL — AB (ref 5–40)

## 2017-08-08 NOTE — Telephone Encounter (Signed)
-----   Message from Dorena Dew,  sent at 08/08/2017  6:17 AM EDT ----- Regarding: lab results Please inform patient that creatine level is moderately increased, which is consistent with chronic kidney disease. It is important that he take anti diabetic and hypertension medications as prescribed in order to achieve positive outcomes.   Donia Pounds  MSN, FNP-C Patient [facility] [address] [place], [place] [address] [phone]

## 2017-08-08 NOTE — Telephone Encounter (Signed)
Called, no answer and no voicemail picked up, will try later. Thanks!

## 2017-08-09 NOTE — Telephone Encounter (Signed)
Called and left a message that creatine level is moderately increased. Advised that it is important that he take anti diabetic and htn medications as prescribed to achieve positive outcomes. Asked if any questions to call back to our office and left call back number. Thanks!

## 2017-08-15 ENCOUNTER — Ambulatory Visit (HOSPITAL_COMMUNITY): Admission: RE | Admit: 2017-08-15 | Payer: Medicare Other | Source: Ambulatory Visit

## 2017-08-15 ENCOUNTER — Encounter (HOSPITAL_COMMUNITY): Payer: Medicare Other | Admitting: Cardiology

## 2017-09-19 ENCOUNTER — Encounter (HOSPITAL_COMMUNITY): Payer: Medicare Other | Admitting: Cardiology

## 2017-10-24 ENCOUNTER — Telehealth: Payer: Self-pay

## 2017-10-24 ENCOUNTER — Encounter: Payer: Medicare Other | Admitting: *Deleted

## 2017-10-24 NOTE — Telephone Encounter (Signed)
LMOVM reminding pt to send remote transmission.   

## 2017-10-25 ENCOUNTER — Encounter: Payer: Self-pay | Admitting: Cardiology

## 2017-11-17 ENCOUNTER — Ambulatory Visit (INDEPENDENT_AMBULATORY_CARE_PROVIDER_SITE_OTHER): Payer: Medicare Other | Admitting: *Deleted

## 2017-11-17 DIAGNOSIS — I428 Other cardiomyopathies: Secondary | ICD-10-CM

## 2017-11-20 NOTE — Progress Notes (Signed)
Remote ICD transmission.   

## 2017-11-22 ENCOUNTER — Ambulatory Visit: Payer: Medicare Other | Admitting: Family Medicine

## 2017-12-06 ENCOUNTER — Ambulatory Visit (INDEPENDENT_AMBULATORY_CARE_PROVIDER_SITE_OTHER): Payer: Medicare Other | Admitting: Family Medicine

## 2017-12-06 ENCOUNTER — Encounter: Payer: Self-pay | Admitting: Family Medicine

## 2017-12-06 VITALS — BP 158/102 | HR 89 | Temp 98.9°F | Resp 16 | Ht 71.0 in | Wt 217.0 lb

## 2017-12-06 DIAGNOSIS — I10 Essential (primary) hypertension: Secondary | ICD-10-CM

## 2017-12-06 DIAGNOSIS — Z23 Encounter for immunization: Secondary | ICD-10-CM

## 2017-12-06 DIAGNOSIS — E118 Type 2 diabetes mellitus with unspecified complications: Secondary | ICD-10-CM | POA: Diagnosis not present

## 2017-12-06 DIAGNOSIS — E119 Type 2 diabetes mellitus without complications: Secondary | ICD-10-CM | POA: Diagnosis not present

## 2017-12-06 DIAGNOSIS — Z794 Long term (current) use of insulin: Secondary | ICD-10-CM

## 2017-12-06 LAB — POCT GLYCOSYLATED HEMOGLOBIN (HGB A1C): Hemoglobin A1C: 14.8 % — AB (ref 4.0–5.6)

## 2017-12-06 LAB — CUP PACEART REMOTE DEVICE CHECK
Implantable Pulse Generator Implant Date: 20180706
MDC IDC LEAD IMPLANT DT: 20180706
MDC IDC LEAD LOCATION: 753860
MDC IDC LEAD SERIAL: 432676
MDC IDC PG SERIAL: 236570
MDC IDC SESS DTM: 20190828213848

## 2017-12-06 LAB — POCT URINALYSIS DIPSTICK
Bilirubin, UA: NEGATIVE
Glucose, UA: POSITIVE — AB
Ketones, UA: NEGATIVE
Leukocytes, UA: NEGATIVE
Nitrite, UA: NEGATIVE
Protein, UA: POSITIVE — AB
Spec Grav, UA: >=1.03 — AB (ref 1.010–1.025)
Urobilinogen, UA: 0.2 E.U./dL
pH, UA: 5.5 (ref 5.0–8.0)

## 2017-12-06 LAB — POCT CBG (FASTING - GLUCOSE)-MANUAL ENTRY: Glucose Fasting, POC: 294 mg/dL — AB (ref 70–99)

## 2017-12-06 MED ORDER — ACCU-CHEK AVIVA PLUS W/DEVICE KIT: 1.0000 | PACK | Freq: Three times a day (TID) | 0 refills | Status: DC

## 2017-12-06 MED ORDER — ATORVASTATIN CALCIUM 80 MG PO TABS: 80.0000 mg | ORAL_TABLET | Freq: Every day | ORAL | 3 refills | Status: DC

## 2017-12-06 MED ORDER — DAPAGLIFLOZIN PROPANEDIOL 5 MG PO TABS: 5.0000 mg | ORAL_TABLET | Freq: Every day | ORAL | 1 refills | Status: DC

## 2017-12-06 MED ORDER — METFORMIN HCL 1000 MG PO TABS: 1000.0000 mg | ORAL_TABLET | Freq: Two times a day (BID) | ORAL | 3 refills | Status: DC

## 2017-12-06 MED ORDER — INSULIN GLARGINE 100 UNIT/ML SOLOSTAR PEN: 40.0000 [IU] | PEN_INJECTOR | Freq: Every day | SUBCUTANEOUS | 3 refills | Status: DC

## 2017-12-06 NOTE — Progress Notes (Signed)
Patient [facility]  Chronic Disease Follow Up Provider: Lanae Boast, FNP  SUBJECTIVE:  Patient presents for follow up for the following  chronic conditions.  has a past medical history of AICD (automatic cardioverter/defibrillator) present (10/14/2016), Bronchitis, CHF (congestive heart failure) ([place]), Depression, Diabetes mellitus without complication ([place]), Dyslipidemia, Hypertension, NICM (nonischemic cardiomyopathy) ([place]) (09/15/2016), and Pancreatitis. Patient states that he has been out of his blood pressure medication x 2-3 weeks. He also states that he has been inconsistent with taking insulin and DM2 medications whil he waas in [place] x 3 months.    Hypertension  Blood pressure is well controlled at home. Patient denies chest pain, dyspnea, lower extremity edema, palpitations and syncope. Medication compliance: No  Diabetes medication compliance: noncompliant much of the time, diabetic diet compliance: noncompliant much of the time, home glucose monitoring: is not performed  Hyperlipidemia Compliance with treatment has been poor.  Patient denies muscle pain associated with his medications.  his is not exercising and is not adherent to a low-salt, ADA, heart healthy or carbohydrate modified diet.   Review of Systems  Constitutional: Negative.   HENT: Negative.   Eyes: Negative.   Respiratory: Negative.   Cardiovascular: Negative.   Gastrointestinal: Negative.   Genitourinary: Negative.   Musculoskeletal: Negative.   Skin: Negative.   Neurological: Negative.   Psychiatric/Behavioral: Negative.     OBJECTIVE:  BP (!) 158/102 (BP Location: Left Arm, Patient Position: Sitting, Cuff Size: Large) Comment: manually  Pulse 89   Temp 98.9 F (37.2 C) (Oral)   Resp 16   Ht 5\' 11"  (1.803 m)   Wt 217 lb (98.4 kg)   SpO2 99%   BMI 30.27 kg/m   Physical Exam  Constitutional: He is oriented to person, place, and time and  well-developed, well-nourished, and in no distress. No distress.  HENT:  Head: Normocephalic and atraumatic.  Eyes: Pupils are equal, round, and reactive to light. Conjunctivae and EOM are normal.  Neck: Normal range of motion. Neck supple.  Cardiovascular: Normal rate, regular rhythm and intact distal pulses. Exam reveals no gallop and no friction rub.  No murmur heard. Pulmonary/Chest: Effort normal and breath sounds normal. No respiratory distress. He has no wheezes.  Abdominal: Soft. Bowel sounds are normal. There is no tenderness.  Musculoskeletal: Normal range of motion. He exhibits no edema or tenderness.  Lymphadenopathy:    He has no cervical adenopathy.  Neurological: He is alert and oriented to person, place, and time. Gait normal.  Skin: Skin is warm and dry.  Psychiatric: Mood, memory, affect and judgment normal.  Nursing note and vitals reviewed.    ASSESSMENT/PLAN:  1. Essential hypertension Followed by cardiology.  - Urinalysis Dipstick  2. Type 2 diabetes mellitus with complication, with long-term current use of insulin (HCC) Increase Lantus to 40 units QHS.  - HgB A1c- over 14.  - Glucose (CBG), Fasting Patient reports last eye exam 05/2017.   3. Flu vaccine need Administered.  - Flu Vaccine QUAD 6+ mos PF IM (Fluarix Quad PF)     Return to care as scheduled and prn. Patient verbalized understanding and agreed with plan of care.   Ms. Doug Sou. Nathaneil Canary, FNP-BC Patient [facility] [place], [place] [address] [phone]    The 2018 Physical Activity Guidelines recommend the equivalent of 150 minutes per week of moderate to vigorous aerobic activity each week, with muscle-strengthening activities on two days during the  week    PRESCRIPTION FOR EXERCISE  Frequency Four to five days per week  Intensity Moderate- During moderate intensity exercise, a person is too winded to sing but is not so winded they  cannot talk  Time 30 minutes or a total of 150 minutes per week.   Type Brisk walking PLUS One set each of: 0 Body weight squats  10 0 Plank hold for 30 seconds   Basic bodyweight exercise guide: Squat and plank  (A) Body weight squat: 0 The squat develops strength in the legs and torso. Stand with your feet about shoulder's width apart and slightly externally rotated. Maintain your weight on your heels or midfoot throughout the exercise. Keep your lower back flat; do not allow it to round or to extend excessively. Your knees should remain aligned with your ankles and legs throughout the motion (knees caving inward towards one another is a common problem). Descend until your hips come just below the level of your knees. If strength or mobility limitations prevent you from reaching this depth, begin with partial squats and gradually increase the depth over time. Once you can perform 15 squats with proper technique and full depth, make the exercise more challenging by holding a weight. (B) Standard plank: 0 The basic plank develops torso (core) strength. Maintain a straight torso, not allowing your lower back to sag or your hips to elevate, throughout the exercise. Gradually increase the time you can hold the position.

## 2017-12-06 NOTE — Patient Instructions (Signed)

## 2017-12-07 LAB — COMPREHENSIVE METABOLIC PANEL
ALT: 13 IU/L (ref 0–44)
AST: 13 IU/L (ref 0–40)
Albumin/Globulin Ratio: 1.1 — ABNORMAL LOW (ref 1.2–2.2)
Albumin: 4.2 g/dL (ref 3.5–5.5)
Alkaline Phosphatase: 71 IU/L (ref 39–117)
BUN/Creatinine Ratio: 13 (ref 9–20)
BUN: 13 mg/dL (ref 6–24)
Bilirubin Total: 0.2 mg/dL (ref 0.0–1.2)
CO2: 26 mmol/L (ref 20–29)
Calcium: 9.7 mg/dL (ref 8.7–10.2)
Chloride: 95 mmol/L — ABNORMAL LOW (ref 96–106)
Creatinine, Ser: 1.02 mg/dL (ref 0.76–1.27)
GFR calc Af Amer: 100 mL/min/{1.73_m2} (ref >59)
GFR calc non Af Amer: 87 mL/min/{1.73_m2} (ref >59)
Globulin, Total: 4 g/dL (ref 1.5–4.5)
Glucose: 280 mg/dL — ABNORMAL HIGH (ref 65–99)
Potassium: 4.2 mmol/L (ref 3.5–5.2)
Sodium: 136 mmol/L (ref 134–144)
Total Protein: 8.2 g/dL (ref 6.0–8.5)

## 2017-12-07 LAB — CBC WITH DIFFERENTIAL/PLATELET
Basophils Absolute: 0 10*3/uL (ref 0.0–0.2)
Basos: 1 %
EOS (ABSOLUTE): 0 10*3/uL (ref 0.0–0.4)
Eos: 1 %
Hematocrit: 41.3 % (ref 37.5–51.0)
Hemoglobin: 12.9 g/dL — ABNORMAL LOW (ref 13.0–17.7)
Immature Grans (Abs): 0 10*3/uL (ref 0.0–0.1)
Immature Granulocytes: 0 %
Lymphocytes Absolute: 2.1 10*3/uL (ref 0.7–3.1)
Lymphs: 56 %
MCH: 26.5 pg — ABNORMAL LOW (ref 26.6–33.0)
MCHC: 31.2 g/dL — ABNORMAL LOW (ref 31.5–35.7)
MCV: 85 fL (ref 79–97)
Monocytes Absolute: 0.3 10*3/uL (ref 0.1–0.9)
Monocytes: 8 %
Neutrophils Absolute: 1.3 10*3/uL — ABNORMAL LOW (ref 1.4–7.0)
Neutrophils: 34 %
Platelets: 183 10*3/uL (ref 150–450)
RBC: 4.86 x10E6/uL (ref 4.14–5.80)
RDW: 13.6 % (ref 12.3–15.4)
WBC: 3.8 10*3/uL (ref 3.4–10.8)

## 2017-12-07 LAB — MICROALBUMIN, URINE: Microalbumin, Urine: 1147.7 ug/mL

## 2017-12-26 ENCOUNTER — Other Ambulatory Visit: Payer: Self-pay

## 2017-12-26 MED ORDER — ACCU-CHEK SOFTCLIX LANCETS MISC: 12 refills | Status: DC

## 2018-01-19 ENCOUNTER — Telehealth: Payer: Self-pay | Admitting: *Deleted

## 2018-01-19 NOTE — Telephone Encounter (Signed)
LMTCB/sss  Abnormal [employer]. Called for sx's.

## 2018-01-22 NOTE — Telephone Encounter (Signed)
[name] returning call via sign language interpreter regarding [employer] alert on Friday 01/19/18. Patient denies ShOB and swelling. He does not check his weight daily, so I advised him to check his weight each morning after using the restroom/before breakfast and to call if he gains 5lbs within a week. He does take spironolactone and lasix- he reports that he has missed doses in the past but most often he takes them as prescribed. He verbalizes understanding and is appreciative.

## 2018-02-19 ENCOUNTER — Encounter: Payer: Medicare Other | Admitting: *Deleted

## 2018-02-19 ENCOUNTER — Telehealth: Payer: Self-pay | Admitting: Cardiology

## 2018-02-19 NOTE — Telephone Encounter (Signed)
LMOVM reminding pt to send remote transmission.   

## 2018-02-21 ENCOUNTER — Encounter: Payer: Self-pay | Admitting: Cardiology

## 2018-03-05 ENCOUNTER — Ambulatory Visit (INDEPENDENT_AMBULATORY_CARE_PROVIDER_SITE_OTHER): Payer: Medicare Other

## 2018-03-05 DIAGNOSIS — I5022 Chronic systolic (congestive) heart failure: Secondary | ICD-10-CM

## 2018-03-05 DIAGNOSIS — I428 Other cardiomyopathies: Secondary | ICD-10-CM

## 2018-03-05 NOTE — Progress Notes (Signed)
Remote ICD transmission.   

## 2018-03-07 ENCOUNTER — Ambulatory Visit: Payer: Medicare Other | Admitting: Family Medicine

## 2018-04-27 LAB — CUP PACEART REMOTE DEVICE CHECK
Date Time Interrogation Session: 20200117093413
Implantable Lead Location: 753860
MDC IDC LEAD IMPLANT DT: 20180706
MDC IDC LEAD SERIAL: 432676
MDC IDC PG IMPLANT DT: 20180706
Pulse Gen Serial Number: 236570

## 2018-06-04 ENCOUNTER — Ambulatory Visit (INDEPENDENT_AMBULATORY_CARE_PROVIDER_SITE_OTHER): Payer: Medicare Other | Admitting: *Deleted

## 2018-06-04 DIAGNOSIS — I428 Other cardiomyopathies: Secondary | ICD-10-CM

## 2018-06-04 DIAGNOSIS — I5022 Chronic systolic (congestive) heart failure: Secondary | ICD-10-CM

## 2018-06-05 LAB — CUP PACEART REMOTE DEVICE CHECK
Battery Remaining Longevity: 180 mo
Brady Statistic RV Percent Paced: 0 %
HIGH POWER IMPEDANCE MEASURED VALUE: 67 Ohm
Implantable Lead Implant Date: 20180706
Implantable Lead Location: 753860
Implantable Lead Serial Number: 432676
Implantable Pulse Generator Implant Date: 20180706
Lead Channel Setting Pacing Pulse Width: 0.4 ms
Lead Channel Setting Sensing Sensitivity: 0.5 mV
MDC IDC MSMT BATTERY REMAINING PERCENTAGE: 100 %
MDC IDC MSMT LEADCHNL RV IMPEDANCE VALUE: 505 Ohm
MDC IDC PG SERIAL: 236570
MDC IDC SESS DTM: 20200224054200
MDC IDC SET LEADCHNL RV PACING AMPLITUDE: 2.5 V

## 2018-06-12 NOTE — Progress Notes (Signed)
Remote ICD transmission.   

## 2018-06-21 ENCOUNTER — Other Ambulatory Visit (HOSPITAL_COMMUNITY): Payer: Self-pay

## 2018-06-21 ENCOUNTER — Other Ambulatory Visit (HOSPITAL_COMMUNITY): Payer: Self-pay | Admitting: Cardiology

## 2018-06-21 MED ORDER — FUROSEMIDE 20 MG PO TABS: 20.0000 mg | ORAL_TABLET | Freq: Every day | ORAL | 0 refills | Status: DC

## 2018-06-21 MED ORDER — CARVEDILOL 12.5 MG PO TABS: 18.7500 mg | ORAL_TABLET | Freq: Two times a day (BID) | ORAL | 0 refills | Status: DC

## 2018-06-21 MED ORDER — IVABRADINE HCL 5 MG PO TABS: 5.0000 mg | ORAL_TABLET | Freq: Two times a day (BID) | ORAL | 0 refills | Status: DC

## 2018-06-21 MED ORDER — SACUBITRIL-VALSARTAN 97-103 MG PO TABS: 1.0000 | ORAL_TABLET | Freq: Two times a day (BID) | ORAL | 1 refills | Status: DC

## 2018-06-21 MED ORDER — SPIRONOLACTONE 25 MG PO TABS: 25.0000 mg | ORAL_TABLET | Freq: Every day | ORAL | 0 refills | Status: DC

## 2018-06-21 MED ORDER — DIGOXIN 125 MCG PO TABS: 0.1250 mg | ORAL_TABLET | Freq: Every day | ORAL | 0 refills | Status: DC

## 2018-06-25 ENCOUNTER — Other Ambulatory Visit (HOSPITAL_COMMUNITY): Payer: Self-pay | Admitting: *Deleted

## 2018-07-13 ENCOUNTER — Other Ambulatory Visit: Payer: Self-pay

## 2018-07-13 ENCOUNTER — Encounter: Payer: Self-pay | Admitting: Family Medicine

## 2018-07-13 ENCOUNTER — Ambulatory Visit (INDEPENDENT_AMBULATORY_CARE_PROVIDER_SITE_OTHER): Payer: Medicare Other | Admitting: Family Medicine

## 2018-07-13 DIAGNOSIS — E118 Type 2 diabetes mellitus with unspecified complications: Secondary | ICD-10-CM

## 2018-07-13 DIAGNOSIS — I10 Essential (primary) hypertension: Secondary | ICD-10-CM | POA: Diagnosis not present

## 2018-07-13 DIAGNOSIS — Z794 Long term (current) use of insulin: Secondary | ICD-10-CM | POA: Diagnosis not present

## 2018-07-13 DIAGNOSIS — H9193 Unspecified hearing loss, bilateral: Secondary | ICD-10-CM

## 2018-07-13 DIAGNOSIS — I5022 Chronic systolic (congestive) heart failure: Secondary | ICD-10-CM | POA: Diagnosis not present

## 2018-07-13 MED ORDER — METFORMIN HCL 1000 MG PO TABS: 1000.0000 mg | ORAL_TABLET | Freq: Two times a day (BID) | ORAL | 3 refills | Status: DC

## 2018-07-13 MED ORDER — INSULIN GLARGINE 100 UNIT/ML SOLOSTAR PEN: 40.0000 [IU] | PEN_INJECTOR | Freq: Every day | SUBCUTANEOUS | 3 refills | Status: DC

## 2018-07-13 MED ORDER — ACCU-CHEK AVIVA PLUS W/DEVICE KIT: 1.0000 | PACK | Freq: Three times a day (TID) | 0 refills | Status: DC

## 2018-07-13 MED ORDER — ATORVASTATIN CALCIUM 80 MG PO TABS: 80.0000 mg | ORAL_TABLET | Freq: Every day | ORAL | 3 refills | Status: DC

## 2018-07-13 MED ORDER — CANAGLIFLOZIN 300 MG PO TABS: 300.0000 mg | ORAL_TABLET | Freq: Every day | ORAL | 3 refills | Status: DC

## 2018-07-13 NOTE — Progress Notes (Signed)
  Patient [facility]  Virtual Visit via Telephone Note  I connected with Troy Rush on 07/13/18 at 11:00 AM EDT by telephone and verified that I am speaking with the correct person using two identifiers.   I discussed the limitations, risks, security and privacy concerns of performing an evaluation and management service by telephone and the availability of in person appointments. I also discussed with the patient that there may be a patient responsible charge related to this service. The patient expressed understanding and agreed to proceed.   History of Present Illness: Patient returned from [place] in February and has been in quarantine since then. He states that he left his glucometer in there and has not been able to test his CBGs. He also has not had any medication since December 2019. He states that he has a few metformin tablets left. Neuropathy in fingers for the past month. He reports that he has been monitoring his BP's at home. He denies chest pain SOB dizziness or leg swelling. He states that he is feeling well other than having neuropathy.     Observations/Objective: Sign language interpreter services being utilized throughout this encounter. He reports feeling well.   Assessment and Plan: 1. Type 2 diabetes mellitus with complication, with long-term current use of insulin (HCC) Refilled medications. Patient to come in next week for labs.  - atorvastatin (LIPITOR) 80 MG tablet; Take 1 tablet (80 mg total) by mouth daily.  Dispense: 90 tablet; Refill: 3 - Blood Glucose Monitoring Suppl (ACCU-CHEK AVIVA PLUS) w/Device KIT; 1 each by Does not apply route 4 (four) times daily -  before meals and at bedtime. E.11.8  Dispense: 1 kit; Refill: 0 - canagliflozin (INVOKANA) 300 MG TABS tablet; Take 1 tablet (300 mg total) by mouth daily before breakfast.  Dispense: 90 tablet; Refill: 3 - Insulin Glargine (LANTUS SOLOSTAR) 100 UNIT/ML Solostar Pen;  Inject 40 Units into the skin daily at 10 pm.  Dispense: 15 pen; Refill: 3 - metFORMIN (GLUCOPHAGE) 1000 MG tablet; Take 1 tablet (1,000 mg total) by mouth 2 (two) times daily with a meal.  Dispense: 180 tablet; Refill: 3 - HgB A1c; Future - Comprehensive metabolic panel; Future - Lipid Panel; Future - CBC with Differential; Future  2. Essential hypertension Patient is followed by cardiology.   3. Bilateral deafness Interpreter services utilized throughout the encounter.    4. Chronic systolic CHF (congestive heart failure) ([place]) No medication changes warranted at the present time.  Patient is being followed by a specialist for this condition. Patient advised to continue with follow up appointments. PCP will continue to monitor progress.   Follow Up Instructions:    I discussed the assessment and treatment plan with the patient. The patient was provided an opportunity to ask questions and all were answered. The patient agreed with the plan and demonstrated an understanding of the instructions.   The patient was advised to call back or seek an in-person evaluation if the symptoms worsen or if the condition fails to improve as anticipated.  I provided 25 minutes of non-face-to-face time during this encounter.  Ms. Andr L. Nathaneil Canary, FNP-BC [facility] [address] [place], [place] [address] [phone]

## 2018-07-19 ENCOUNTER — Other Ambulatory Visit (INDEPENDENT_AMBULATORY_CARE_PROVIDER_SITE_OTHER): Payer: Medicare Other

## 2018-07-19 ENCOUNTER — Other Ambulatory Visit: Payer: Self-pay

## 2018-07-19 DIAGNOSIS — Z794 Long term (current) use of insulin: Secondary | ICD-10-CM | POA: Diagnosis not present

## 2018-07-19 DIAGNOSIS — E118 Type 2 diabetes mellitus with unspecified complications: Secondary | ICD-10-CM | POA: Diagnosis not present

## 2018-07-19 LAB — POCT GLYCOSYLATED HEMOGLOBIN (HGB A1C): Hemoglobin A1C: 14.5 % — AB (ref 4.0–5.6)

## 2018-07-20 LAB — COMPREHENSIVE METABOLIC PANEL
ALT: 19 IU/L (ref 0–44)
AST: 19 IU/L (ref 0–40)
Albumin/Globulin Ratio: 1.1 — ABNORMAL LOW (ref 1.2–2.2)
Albumin: 3.9 g/dL — ABNORMAL LOW (ref 4.0–5.0)
Alkaline Phosphatase: 64 IU/L (ref 39–117)
BUN/Creatinine Ratio: 26 — ABNORMAL HIGH (ref 9–20)
BUN: 41 mg/dL — ABNORMAL HIGH (ref 6–24)
Bilirubin Total: 1.8 mg/dL — ABNORMAL HIGH (ref 0.0–1.2)
CO2: 19 mmol/L — ABNORMAL LOW (ref 20–29)
Calcium: 9.3 mg/dL (ref 8.7–10.2)
Chloride: 90 mmol/L — ABNORMAL LOW (ref 96–106)
Creatinine, Ser: 1.58 mg/dL — ABNORMAL HIGH (ref 0.76–1.27)
GFR calc Af Amer: 59 mL/min/{1.73_m2} — ABNORMAL LOW (ref >59)
GFR calc non Af Amer: 51 mL/min/{1.73_m2} — ABNORMAL LOW (ref >59)
Globulin, Total: 3.4 g/dL (ref 1.5–4.5)
Glucose: 613 mg/dL (ref 65–99)
Potassium: 5.3 mmol/L — ABNORMAL HIGH (ref 3.5–5.2)
Sodium: 126 mmol/L — ABNORMAL LOW (ref 134–144)
Total Protein: 7.3 g/dL (ref 6.0–8.5)

## 2018-07-20 LAB — CBC WITH DIFFERENTIAL/PLATELET
Basophils Absolute: 0 10*3/uL (ref 0.0–0.2)
Basos: 1 %
EOS (ABSOLUTE): 0 10*3/uL (ref 0.0–0.4)
Eos: 1 %
Hematocrit: 39.3 % (ref 37.5–51.0)
Hemoglobin: 12.7 g/dL — ABNORMAL LOW (ref 13.0–17.7)
Immature Grans (Abs): 0 10*3/uL (ref 0.0–0.1)
Immature Granulocytes: 1 %
Lymphocytes Absolute: 1.4 10*3/uL (ref 0.7–3.1)
Lymphs: 33 %
MCH: 29.1 pg (ref 26.6–33.0)
MCHC: 32.3 g/dL (ref 31.5–35.7)
MCV: 90 fL (ref 79–97)
Monocytes Absolute: 0.5 10*3/uL (ref 0.1–0.9)
Monocytes: 11 %
Neutrophils Absolute: 2.4 10*3/uL (ref 1.4–7.0)
Neutrophils: 53 %
Platelets: 246 10*3/uL (ref 150–450)
RBC: 4.37 x10E6/uL (ref 4.14–5.80)
RDW: 12.1 % (ref 11.6–15.4)
WBC: 4.3 10*3/uL (ref 3.4–10.8)

## 2018-07-20 LAB — LIPID PANEL
Chol/HDL Ratio: 10.6 ratio — ABNORMAL HIGH (ref 0.0–5.0)
Cholesterol, Total: 222 mg/dL — ABNORMAL HIGH (ref 100–199)
HDL: 21 mg/dL — ABNORMAL LOW (ref >39)
Triglycerides: 1604 mg/dL (ref 0–149)

## 2018-07-20 NOTE — Progress Notes (Unsigned)
Received a call from [employer] that patient had critical glucose of 613  Patient has been without his medications for several months.

## 2018-08-17 ENCOUNTER — Encounter (HOSPITAL_COMMUNITY): Payer: Medicare Other | Admitting: Cardiology

## 2018-08-17 ENCOUNTER — Other Ambulatory Visit (HOSPITAL_COMMUNITY): Payer: Self-pay | Admitting: *Deleted

## 2018-08-17 MED ORDER — ISOSORBIDE MONONITRATE ER 30 MG PO TB24: 30.0000 mg | ORAL_TABLET | Freq: Every day | ORAL | 3 refills | Status: DC

## 2018-08-17 MED ORDER — FUROSEMIDE 20 MG PO TABS: 20.0000 mg | ORAL_TABLET | Freq: Every day | ORAL | 3 refills | Status: DC

## 2018-08-17 MED ORDER — DIGOXIN 125 MCG PO TABS: 0.1250 mg | ORAL_TABLET | Freq: Every day | ORAL | 3 refills | Status: DC

## 2018-08-17 MED ORDER — SPIRONOLACTONE 25 MG PO TABS: 25.0000 mg | ORAL_TABLET | Freq: Every day | ORAL | 3 refills | Status: DC

## 2018-08-17 MED ORDER — HYDRALAZINE HCL 50 MG PO TABS: 50.0000 mg | ORAL_TABLET | Freq: Three times a day (TID) | ORAL | 2 refills | Status: DC

## 2018-08-17 MED ORDER — CARVEDILOL 12.5 MG PO TABS: 18.7500 mg | ORAL_TABLET | Freq: Two times a day (BID) | ORAL | 3 refills | Status: DC

## 2018-08-17 MED ORDER — SACUBITRIL-VALSARTAN 97-103 MG PO TABS: 1.0000 | ORAL_TABLET | Freq: Two times a day (BID) | ORAL | 3 refills | Status: DC

## 2018-08-17 MED ORDER — IVABRADINE HCL 5 MG PO TABS: 5.0000 mg | ORAL_TABLET | Freq: Two times a day (BID) | ORAL | 0 refills | Status: DC

## 2018-08-27 ENCOUNTER — Ambulatory Visit (HOSPITAL_COMMUNITY)
Admission: RE | Admit: 2018-08-27 | Discharge: 2018-08-27 | Disposition: A | Discharge status: Home / Self Care | Payer: Medicare Other | Source: Ambulatory Visit | Attending: Cardiology | Admitting: Cardiology

## 2018-08-27 ENCOUNTER — Encounter (HOSPITAL_COMMUNITY): Payer: Self-pay

## 2018-08-27 ENCOUNTER — Other Ambulatory Visit: Payer: Self-pay

## 2018-08-27 DIAGNOSIS — I5022 Chronic systolic (congestive) heart failure: Secondary | ICD-10-CM | POA: Diagnosis not present

## 2018-08-27 DIAGNOSIS — E875 Hyperkalemia: Secondary | ICD-10-CM

## 2018-08-27 DIAGNOSIS — I428 Other cardiomyopathies: Secondary | ICD-10-CM

## 2018-08-27 DIAGNOSIS — I10 Essential (primary) hypertension: Secondary | ICD-10-CM

## 2018-08-27 DIAGNOSIS — E118 Type 2 diabetes mellitus with unspecified complications: Secondary | ICD-10-CM

## 2018-08-27 MED ORDER — DIGOXIN 125 MCG PO TABS: 0.1250 mg | ORAL_TABLET | Freq: Every day | ORAL | 3 refills | Status: DC

## 2018-08-27 MED ORDER — FUROSEMIDE 20 MG PO TABS: 20.0000 mg | ORAL_TABLET | Freq: Every day | ORAL | 3 refills | Status: DC

## 2018-08-27 MED ORDER — IVABRADINE HCL 5 MG PO TABS: 5.0000 mg | ORAL_TABLET | Freq: Two times a day (BID) | ORAL | 0 refills | Status: DC

## 2018-08-27 NOTE — Addendum Note (Signed)
Encounter addended by: Marlise Eves, RN on: 08/27/2018 3:42 PM  Actions taken: Clinical Note Signed

## 2018-08-27 NOTE — Patient Instructions (Addendum)
Please RESTART Furosemide, Ivabridine, and Digoxin.  Please come in the office to have blood work drawn. This is scheduled for May 26th at 10:45.  Please follow up with the Advanced Practice Provider in 2 weeks. This is scheduled for June 3rd at 2:00pm. This will be a telehealth visit like today.

## 2018-08-27 NOTE — Progress Notes (Addendum)
Heart Failure TeleHealth Note  Due to national recommendations of social distancing due to [place] 19, telehealth visit is felt to be most appropriate for this patient at this time.  I discussed the limitations, risks, security and privacy concerns of performing an evaluation and management service by telephone and the availability of in person appointments. I also discussed with the patient that there may be a patient responsible charge related to this service. The patient expressed understanding and agreed to proceed.   ID:  Troy Rush, DOB Jan 04, 1971, MRN [id number]  Location: Home  Provider location: [place] Type of Visit: Established patient   PCP:  Lanae Boast, [place]  Cardiologist:  No primary care provider on file. Primary HF: Dr Aundra Dubin  Chief Complaint: HF follow up   History of Present Illness: Troy Rush is a 48 y.o. male with a history of deafness, HTN, DM, and chronic systolic CHF s/p [employer] ICD.  Patient presents via audio conferencing for a telehealth visit today. Telephone interpretor used. Last seen in HF clinic 06/2017. At that time [name] was increased. Overall doing okay. He traveled to [place] and came back in February. He has been off medications since December/January and said he was unable to get an appointment. He called in May and refills were sent in, but he did not know that the pharmacy had filled them, so he has not picked up. He had some BLE edema last week, but has improved back to normal. He is SOB on exertion. +PND. +orthopnea, sleeping in recliner. Has some dizziness when he first wakes up and stands. Appetite and energy level okay. Weight 218-222 lbs. Back on diabetes medications as of April. Drinking lots of water. Limits salt intake. He does not have a way to check his BP.  HeartLogic: Score of 5, thoracic impedence low, but trending up, average HR 86, sleep incline 43 degree, active 0.4 hours/day.   Labs  07/19/18: creatinine 1.58, K 5.3, A1C 14.5, glucose 613  Pt denies symptoms of cough, fevers, chills, or new SOB worrisome for COVID 19.    - LHC (10/16): No significant CAD.  - CPX (12/17): Mild functional limitation.  - 04/2016 CMRI: EF 27%. RV normal. No evidence of infiltrative disease, myocarditis, or MI.  - Echo (5/18): EF 25-30%, mild LV dilation, mild LVH, normal RV size with mildly decreased  SH: From [place] originally.  Lives in [place] until 2017, then moved to [place].  Nonsmoker.  Used to drink 3-4 ETOH beverages/day until 10/16, now stopped. Works in [employer].   FH: No known cardiac disease.   Past Medical History:  Diagnosis Date  . AICD (automatic cardioverter/defibrillator) present 10/14/2016  . Bronchitis   . CHF (congestive heart failure) ([place])   . Depression   . Diabetes mellitus without complication ([place])   . Dyslipidemia   . Hypertension   . NICM (nonischemic cardiomyopathy) ([place]) 09/15/2016  . Pancreatitis    Past Surgical History:  Procedure Laterality Date  . BACK SURGERY    . CARDIAC CATHETERIZATION    . ICD IMPLANT  10/14/2016  . ICD IMPLANT N/A 10/14/2016   Procedure: ICD Implant;  Surgeon: Deboraha Sprang, MD;  Location: [facility];  Service: Cardiovascular;  Laterality: N/A;  . WRIST SURGERY       Current Outpatient Medications  Medication Sig Dispense Refill  . atorvastatin (LIPITOR) 80 MG tablet Take 1 tablet (80 mg total) by mouth daily. 90 tablet  3  . ACCU-CHEK SOFTCLIX LANCETS lancets Use as instructed 100 each 12  . aspirin 81 MG chewable tablet Chew 81 mg by mouth daily.    . blood glucose meter kit and supplies KIT Dispense based on patient and insurance preference. Use up to four times daily as directed. (FOR ICD-9 250.00, 250.01). 1 each 0  . Blood Glucose Monitoring Suppl (ACCU-CHEK AVIVA PLUS) w/Device KIT 1 each by Does not apply route 4 (four) times daily -  before meals and at bedtime. E.11.8 1 kit 0  . canagliflozin  (INVOKANA) 300 MG TABS tablet Take 1 tablet (300 mg total) by mouth daily before breakfast. 90 tablet 3  . carvedilol (COREG) 12.5 MG tablet Take 1.5 tablets (18.75 mg total) by mouth 2 (two) times daily. (Patient not taking: Reported on 08/27/2018) 240 tablet 3  . citalopram (CELEXA) 40 MG tablet Take 1 tablet (40 mg total) by mouth daily. 90 tablet 1  . digoxin (LANOXIN) 0.125 MG tablet Take 1 tablet (0.125 mg total) by mouth daily. 90 tablet 3  . furosemide (LASIX) 20 MG tablet Take 1 tablet (20 mg total) by mouth daily. (Patient not taking: Reported on 08/27/2018) 90 tablet 3  . glucose blood (ACCU-CHEK AVIVA PLUS) test strip Check blood three times per day and at bedtime 200 each 12  . hydrALAZINE (APRESOLINE) 50 MG tablet Take 1 tablet (50 mg total) by mouth 3 (three) times daily. (Patient not taking: Reported on 08/27/2018) 270 tablet 2  . Insulin Glargine (LANTUS SOLOSTAR) 100 UNIT/ML Solostar Pen Inject 40 Units into the skin daily at 10 pm. 15 pen 3  . isosorbide mononitrate (IMDUR) 30 MG 24 hr tablet Take 1 tablet (30 mg total) by mouth daily. (Patient not taking: Reported on 08/27/2018) 90 tablet 3  . ivabradine (CORLANOR) 5 MG TABS tablet Take 1 tablet (5 mg total) by mouth 2 (two) times daily with a meal. (Patient not taking: Reported on 08/27/2018) 180 tablet 0  . Lancet Devices (ACCU-CHEK SOFTCLIX) lancets Use as instructed 1 each 0  . metFORMIN (GLUCOPHAGE) 1000 MG tablet Take 1 tablet (1,000 mg total) by mouth 2 (two) times daily with a meal. 180 tablet 3  . promethazine (PHENERGAN) 25 MG tablet Take 1 tablet (25 mg total) by mouth every 6 (six) hours as needed for nausea or vomiting. 30 tablet 0  . sacubitril-valsartan (ENTRESTO) 97-103 MG Take 1 tablet by mouth 2 (two) times daily. (Patient not taking: Reported on 08/27/2018) 60 tablet 3  . spironolactone (ALDACTONE) 25 MG tablet Take 1 tablet (25 mg total) by mouth daily. (Patient not taking: Reported on 08/27/2018) 90 tablet 3   No  current facility-administered medications for this encounter.     Allergies:   Lisinopril   Social History:  The patient  reports that he has never smoked. He has never used smokeless tobacco. He reports current alcohol use. He reports that he does not use drugs.   Family History:  The patient's family history includes Hypertension in his mother.   ROS:  Please see the history of present illness.   All other systems are personally reviewed and negative.    Exam:  (Video/Tele Health Call; Exam is subjective and or/visual.) General:  Speaks in full sentences. No resp difficulty. Lungs: Normal respiratory effort with conversation.  Abdomen: No distension per patient report Extremities: Pt denies edema. Neuro: Alert & oriented x 3.   Recent Labs: 07/19/2018: ALT 19; BUN 41; Creatinine, Ser 1.58; Hemoglobin 12.7; Platelets 246;  Potassium 5.3; Sodium 126  Personally reviewed   Wt Readings from Last 3 Encounters:  12/06/17 98.4 kg (217 lb)  08/07/17 106.1 kg (234 lb)  08/03/17 99.8 kg (220 lb)      ASSESSMENT AND PLAN:  1. Chronic systolic CHF: Nonischemic cardiomyopathy, etiology uncertain. Consider prior ETOH or HTN.  He has a [place].  CMRI in 1/18 with EF 27%, RV normal, no evidence of infiltrative disease, myocarditis, or MI.  Echo was done in 5/18, EF remained 25-30%.  - Volume sounds elevated. Okay on HeartLogic. Worse NYHA class III in setting of being off medications x 6 months - Restart lasix 20 mg daily.  - Restart Corlanor 5 mg BID - Restart digoxin 0.125 daily - Hold off on restarting spironolactone and Entresto with last K 5.3 - I would like to know what his BP is given dizziness at home prior to resuming coreg and hydralazine/imdur. Will have him come in for BP check with labs this week.  - Repeat echo after he is back on medications.  - Discussed importance of taking all medications. He needs to call us if he needs refills much sooner. Provided HF clinic  phone number.  2. Type II diabetes: He is on dapagliflozin.  - A1C 14.5. Uncontrolled. He restarted diabetes medications last month.  3. HTN: Needs BP check. See above.  4. Deafness: Phone interpretor in use.  5. Hyperkalemia: K 5.3 on labs 07/19/18. Recheck.    COVID screen The patient does not have any symptoms that suggest any further testing/ screening at this time.  Social distancing reinforced today.  Patient Risk: After full review of this patients clinical status, I feel that they are at moderate risk for cardiac decompensation at this time.  Orders/Follow up: Check BMET and BP in clinic this week. If unable to come here, will arrange for [facility] RN to do a home visit. Restart lasix, corlanor, and digoxin as above. Follow up 1-2 weeks.   Today, I have spent 22 minutes with the patient with telehealth technology discussing heart failure.    Signed, Georgiana Shore, NP  08/27/2018 2:41 PM   [facility] [address] [facility] and [place] [address] [phone] (office) [phone] (fax)

## 2018-08-27 NOTE — Addendum Note (Signed)
Encounter addended by: Georgiana Shore, NP on: 08/27/2018 3:08 PM  Actions taken: Clinical Note Signed

## 2018-08-27 NOTE — Progress Notes (Signed)
Called and spoke with pt via telephone interpreter. Pt agreeable to up coming appointments. Denies further needs. Pt stated that he doesn't need an interpreter for the lab appointment.

## 2018-08-27 NOTE — Addendum Note (Signed)
Encounter addended by: Marlise Eves, RN on: 08/27/2018 3:40 PM  Actions taken: Diagnosis association updated, Pharmacy for encounter modified, Order list changed, Clinical Note Signed

## 2018-09-04 ENCOUNTER — Ambulatory Visit (INDEPENDENT_AMBULATORY_CARE_PROVIDER_SITE_OTHER): Payer: Medicare Other | Admitting: *Deleted

## 2018-09-04 ENCOUNTER — Ambulatory Visit (HOSPITAL_COMMUNITY)
Admission: RE | Admit: 2018-09-04 | Discharge: 2018-09-04 | Disposition: A | Discharge status: Home / Self Care | Payer: Medicare Other | Source: Ambulatory Visit | Attending: Cardiology | Admitting: Cardiology

## 2018-09-04 ENCOUNTER — Other Ambulatory Visit: Payer: Self-pay

## 2018-09-04 DIAGNOSIS — I428 Other cardiomyopathies: Secondary | ICD-10-CM

## 2018-09-04 DIAGNOSIS — I5022 Chronic systolic (congestive) heart failure: Secondary | ICD-10-CM

## 2018-09-04 LAB — BASIC METABOLIC PANEL
Anion gap: 10 (ref 5–15)
BUN: 13 mg/dL (ref 6–20)
CO2: 24 mmol/L (ref 22–32)
Calcium: 9.8 mg/dL (ref 8.9–10.3)
Chloride: 102 mmol/L (ref 98–111)
Creatinine, Ser: 1.02 mg/dL (ref 0.61–1.24)
GFR calc Af Amer: >60 mL/min (ref >60)
GFR calc non Af Amer: >60 mL/min (ref >60)
Glucose, Bld: 193 mg/dL — ABNORMAL HIGH (ref 70–99)
Potassium: 5.1 mmol/L (ref 3.5–5.1)
Sodium: 136 mmol/L (ref 135–145)

## 2018-09-04 NOTE — Progress Notes (Signed)
Patient seen in office for lab work and blood pressure check after restarting all of his medications ( see below ) on 08/27/2018.  Blood pressure today 152/92 patient took medications about 30 min ago.   Restart lasix 20 mg daily.  - Restart Corlanor 5 mg BID - Restart digoxin 0.125 daily - Hold off on restarting spironolactone and Entresto with last K 5.3 - I would like to know what his BP is given dizziness at home prior to resuming coreg and hydralazine/imdur. Will have him come in for BP check with labs this week  Routed to [place]

## 2018-09-04 NOTE — Addendum Note (Signed)
Encounter addended by: Harvie Junior, CMA on: 09/04/2018 10:21 AM  Actions taken: Vitals modified, Clinical Note Signed

## 2018-09-05 LAB — CUP PACEART REMOTE DEVICE CHECK
Date Time Interrogation Session: 20200527115646
Implantable Lead Implant Date: 20180706
Implantable Lead Location: 753860
Implantable Lead Model: 293
Implantable Lead Serial Number: 432676
Implantable Pulse Generator Implant Date: 20180706
Pulse Gen Serial Number: 236570

## 2018-09-12 ENCOUNTER — Ambulatory Visit (HOSPITAL_COMMUNITY)
Admission: RE | Admit: 2018-09-12 | Discharge: 2018-09-12 | Disposition: A | Discharge status: Home / Self Care | Payer: Medicare Other | Source: Ambulatory Visit | Attending: Adult Health | Admitting: Adult Health

## 2018-09-12 ENCOUNTER — Encounter (HOSPITAL_COMMUNITY): Payer: Self-pay | Admitting: *Deleted

## 2018-09-12 ENCOUNTER — Other Ambulatory Visit: Payer: Self-pay

## 2018-09-12 ENCOUNTER — Telehealth (HOSPITAL_COMMUNITY): Payer: Self-pay | Admitting: *Deleted

## 2018-09-12 NOTE — Telephone Encounter (Signed)
Amy could not reach pt for telehealth visit and ask that we schedule an in office visit with her in the next 2-4 weeks. Called pt could not reach him so I sent a mychart message. (please see below)  Harvie Junior, CMA sent to Mychart, Generic        Good afternoon,  We tried contacting you this afternoon on the interpreter line for a telehealth visit but we were unable to reach you. Amy would like for you to have an in office visit in the next 2-4 weeks. Please contact our office at [phone] and select the option for scheduling to set up your appointment.   Thank you,  Genelle Bal

## 2018-09-13 NOTE — Progress Notes (Signed)
Remote ICD transmission.   

## 2018-09-24 ENCOUNTER — Other Ambulatory Visit: Payer: Self-pay | Admitting: Cardiology

## 2018-10-08 ENCOUNTER — Other Ambulatory Visit: Payer: Self-pay

## 2018-10-08 ENCOUNTER — Ambulatory Visit (HOSPITAL_COMMUNITY)
Admission: RE | Admit: 2018-10-08 | Discharge: 2018-10-08 | Disposition: A | Discharge status: Home / Self Care | Payer: Medicare Other | Source: Ambulatory Visit | Attending: Cardiology | Admitting: Cardiology

## 2018-10-08 ENCOUNTER — Encounter (HOSPITAL_COMMUNITY): Payer: Self-pay

## 2018-10-08 VITALS — BP 162/104 | HR 97 | Wt 236.2 lb

## 2018-10-08 DIAGNOSIS — H9190 Unspecified hearing loss, unspecified ear: Secondary | ICD-10-CM | POA: Insufficient documentation

## 2018-10-08 DIAGNOSIS — I428 Other cardiomyopathies: Secondary | ICD-10-CM | POA: Insufficient documentation

## 2018-10-08 DIAGNOSIS — I110 Hypertensive heart disease with heart failure: Secondary | ICD-10-CM | POA: Insufficient documentation

## 2018-10-08 DIAGNOSIS — I5022 Chronic systolic (congestive) heart failure: Secondary | ICD-10-CM | POA: Insufficient documentation

## 2018-10-08 DIAGNOSIS — I10 Essential (primary) hypertension: Secondary | ICD-10-CM

## 2018-10-08 DIAGNOSIS — Z79899 Other long term (current) drug therapy: Secondary | ICD-10-CM | POA: Insufficient documentation

## 2018-10-08 DIAGNOSIS — Z794 Long term (current) use of insulin: Secondary | ICD-10-CM | POA: Diagnosis not present

## 2018-10-08 DIAGNOSIS — Z7982 Long term (current) use of aspirin: Secondary | ICD-10-CM | POA: Insufficient documentation

## 2018-10-08 DIAGNOSIS — E785 Hyperlipidemia, unspecified: Secondary | ICD-10-CM | POA: Diagnosis not present

## 2018-10-08 DIAGNOSIS — E119 Type 2 diabetes mellitus without complications: Secondary | ICD-10-CM | POA: Insufficient documentation

## 2018-10-08 DIAGNOSIS — Z95810 Presence of automatic (implantable) cardiac defibrillator: Secondary | ICD-10-CM | POA: Insufficient documentation

## 2018-10-08 DIAGNOSIS — E875 Hyperkalemia: Secondary | ICD-10-CM

## 2018-10-08 DIAGNOSIS — F329 Major depressive disorder, single episode, unspecified: Secondary | ICD-10-CM | POA: Insufficient documentation

## 2018-10-08 LAB — BASIC METABOLIC PANEL
Anion gap: 10 (ref 5–15)
BUN: 11 mg/dL (ref 6–20)
CO2: 24 mmol/L (ref 22–32)
Calcium: 9.5 mg/dL (ref 8.9–10.3)
Chloride: 103 mmol/L (ref 98–111)
Creatinine, Ser: 1 mg/dL (ref 0.61–1.24)
GFR calc Af Amer: >60 mL/min (ref >60)
GFR calc non Af Amer: >60 mL/min (ref >60)
Glucose, Bld: 96 mg/dL (ref 70–99)
Potassium: 4.8 mmol/L (ref 3.5–5.1)
Sodium: 137 mmol/L (ref 135–145)

## 2018-10-08 MED ORDER — ISOSORBIDE MONONITRATE ER 30 MG PO TB24: 30.0000 mg | ORAL_TABLET | Freq: Every day | ORAL | 3 refills | Status: DC

## 2018-10-08 MED ORDER — HYDRALAZINE HCL 25 MG PO TABS: 25.0000 mg | ORAL_TABLET | Freq: Three times a day (TID) | ORAL | 3 refills | Status: DC

## 2018-10-08 MED ORDER — FUROSEMIDE 20 MG PO TABS: 40.0000 mg | ORAL_TABLET | Freq: Every day | ORAL | 3 refills | Status: DC

## 2018-10-08 NOTE — Progress Notes (Signed)
Advanced Heart Failure Clinic Note   PCP: Cammie Sickle Cardiology: Dr. Aundra Dubin  48 y.o. with deafness, HTN, DM, and chronic systolic CHF presents for followup of CHF.  Patient had no known cardiac problems until 10/16.  At that time, he developed exertional dyspnea and was admitted to a hospital in [place], [place] with acute systolic CHF.  Echo 10/16 showed EF 15-20%.  Cardiac cath showed no significant coronary disease.  He was diuresed and started on cardiac meds. Subsequently, he moved to [facility].  Now working in a [employer].  Repeat echo in 5/18 showed persistently low EF, 25-30%.  He had Sims placed.   Today he returns for HF follow up. Overall feeling fair. Says he was out of his medications at least 6 months when he was out of the country. In May he was started back on lasix , digoxin, and ivabradine.Complaining of cough and SOB with exertion.  Denies PND/Orthopnea.Drinking lots of fluids. Appetite ok. No fever or chills. Taking all medications.  [employer] Heartlogic HF index = 15   Labs (9/17): BNP 477, K 4.5 => 4.2, creatinine 0.92 => 1.1, BNP 1009, HIV negative, SPEP negative.  Labs (10/17): K 5.2, creatinine 1.10, hgb 12.5 Labs (03/28/2016): K 5.3 Creatinine 1.07  Labs (3/18): digoxin < 0.2, K 4.1, creatinine 0.97 Labs (9/18): hgb 12.2, K 4, creatinine 0.99, BNP 139 Labs (1/19): hgb 11.2, K 4.8, creatinine 1.26 Alabs (09/04/18): K 5.1 Creatinine 1  PMH: 1. HTN 2. Diabetes 3. Hyperlipidemia 4. Deafness since age 10: Needs sign language interpreter.  5. Chronic systolic CHF: Nonischemic cardiomyopathy.  Diagnosis 10/16 in [place], [place] (admitted with acute systolic CHF).  SPEP and HIV negative.  [place].  - Echo (10/16): EF 15-20%, mild LVH, mild MR, normal RV size and systolic function.  - LHC (10/16): No significant CAD.  - CPX (12/17): Mild functional limitation.  - 04/2016 CMRI: EF 27%. RV normal. No evidence of infiltrative disease,  myocarditis, or MI.  - Echo (5/18): EF 25-30%, mild LV dilation, mild LVH, normal RV size with mildly decreased 6. Depression. 7. Colitis episodes.   SH: From [place] originally.  Lives in [place] until 2017, then moved to [place].  Nonsmoker.  Used to drink 3-4 ETOH beverages/day until 10/16, now stopped. Works in [employer].   FH: No known cardiac disease.   ROS: All systems reviewed and negative except as per HPI.   Current Outpatient Medications  Medication Sig Dispense Refill  . ACCU-CHEK SOFTCLIX LANCETS lancets Use as instructed 100 each 12  . aspirin 81 MG chewable tablet Chew 81 mg by mouth daily.    Marland Kitchen atorvastatin (LIPITOR) 80 MG tablet Take 1 tablet (80 mg total) by mouth daily. 90 tablet 3  . blood glucose meter kit and supplies KIT Dispense based on patient and insurance preference. Use up to four times daily as directed. (FOR ICD-9 250.00, 250.01). 1 each 0  . Blood Glucose Monitoring Suppl (ACCU-CHEK AVIVA PLUS) w/Device KIT 1 each by Does not apply route 4 (four) times daily -  before meals and at bedtime. E.11.8 1 kit 0  . canagliflozin (INVOKANA) 300 MG TABS tablet Take 1 tablet (300 mg total) by mouth daily before breakfast. 90 tablet 3  . citalopram (CELEXA) 40 MG tablet Take 1 tablet (40 mg total) by mouth daily. 90 tablet 1  . digoxin (LANOXIN) 0.125 MG tablet Take 1 tablet (0.125 mg total) by mouth daily. 90 tablet 3  . furosemide (LASIX)  20 MG tablet Take 2 tablets (40 mg total) by mouth daily. 90 tablet 3  . Insulin Glargine (LANTUS SOLOSTAR) 100 UNIT/ML Solostar Pen Inject 40 Units into the skin daily at 10 pm. 15 pen 3  . ivabradine (CORLANOR) 5 MG TABS tablet Take 1 tablet (5 mg total) by mouth 2 (two) times daily with a meal. 180 tablet 0  . Lancet Devices (ACCU-CHEK SOFTCLIX) lancets Use as instructed 1 each 0  . metFORMIN (GLUCOPHAGE) 1000 MG tablet Take 1 tablet (1,000 mg total) by mouth 2 (two) times daily with a meal. 180 tablet 3  . carvedilol (COREG) 12.5  MG tablet Take 1.5 tablets (18.75 mg total) by mouth 2 (two) times daily. (Patient not taking: Reported on 08/27/2018) 240 tablet 3  . glucose blood (ACCU-CHEK AVIVA PLUS) test strip Check blood three times per day and at bedtime (Patient not taking: Reported on 10/08/2018) 200 each 12  . hydrALAZINE (APRESOLINE) 25 MG tablet Take 1 tablet (25 mg total) by mouth 3 (three) times daily. 270 tablet 3  . isosorbide mononitrate (IMDUR) 30 MG 24 hr tablet Take 1 tablet (30 mg total) by mouth daily. 90 tablet 3  . promethazine (PHENERGAN) 25 MG tablet Take 1 tablet (25 mg total) by mouth every 6 (six) hours as needed for nausea or vomiting. (Patient not taking: Reported on 10/08/2018) 30 tablet 0   No current facility-administered medications for this encounter.    BP (!) 162/104   Pulse 97   Wt 107.1 kg (236 lb 3.2 oz)   SpO2 99%   BMI 32.94 kg/m    Wt Readings from Last 3 Encounters:  10/08/18 107.1 kg (236 lb 3.2 oz)  12/06/17 98.4 kg (217 lb)  08/07/17 106.1 kg (234 lb)  Deaf Interpreter present.  General:  Well appearing. No resp difficulty HEENT: normal Neck: supple. JVP 10-11. Carotids 2+ bilat; no bruits. No lymphadenopathy or thryomegaly appreciated. Cor: PMI nondisplaced. Regular rate & rhythm. No rubs, or murmurs. +S3 Lungs: clear Abdomen: soft, nontender, distended. No hepatosplenomegaly. No bruits or masses. Good bowel sounds. Extremities: no cyanosis, clubbing, rash, R and LLE trace edema Neuro: alert & orientedx3, cranial nerves grossly intact. moves all 4 extremities w/o difficulty. Affect pleasant  Assessment/Plan: 1. Chronic systolic CHF: Nonischemic cardiomyopathy, etiology uncertain. Consider prior ETOH or HTN.  He has a [place].  CMRI in 1/18 with EF 27%, RV normal, no evidence of infiltrative disease, myocarditis, or MI.  Echo was done in 5/18, EF remained 25-30%.   -NYHA IIIb. Volume status elevated. Increase lasix to 40 mg daily.  -Continue digoxin for now.   - Hold off on coreg.  - Hold off on spiro and entresto with recent hyperkalemia.  - Restart 25 mg hydralazine three times a day + imdur 30 mg daily.  -Continue  Corlanor at current doses. 2. Type II diabetes: Per PCP  3. HTN: Elevated. Adjusting medications at noted above.  4. Deafness: Sign language interpreter present at visit.  5. Hyperkalemia: Runs >5. Check BMET today.   Follow up in 3 weeks for additional medications titration. Hopefully we can start coreg.  Follow up in 3 months with Dr Aundra Dubin. Discussed the importance of compliance and low salt diet and to limit fluid intake to < 2 liters per day.   Darrick Grinder, NP  10/08/2018

## 2018-10-08 NOTE — Patient Instructions (Signed)
Lab work done today. We will notify you of any abnormal lab work. No news is good news!  INCREASE Furosemide 40mg  daily  START Hydralazine 25mg  ( 1 tab) three times daily.  RESTART Imdur 30mg  daily  Please follow up with the [facility] in 3 weeks and again in 3 months with an echocardiogram.  Your physician has requested that you have an echocardiogram. Echocardiography is a painless test that uses sound waves to create images of your heart. It provides your doctor with information about the size and shape of your heart and how well your heart's chambers and valves are working. This procedure takes approximately one hour. There are no restrictions for this procedure. This will be done at your 3 month appointment.  At the [facility], you and your health needs are our priority. As part of our continuing mission to provide you with exceptional heart care, we have created designated Provider Care Teams. These Care Teams include your primary Cardiologist (physician) and Advanced Practice Providers (APPs- Physician Assistants and Nurse Practitioners) who all work together to provide you with the care you need, when you need it.   You may see any of the following providers on your designated Care Team at your next follow up: Marland Kitchen Dr Glori Bickers . Dr Loralie Champagne . Darrick Grinder, NP

## 2018-10-11 ENCOUNTER — Other Ambulatory Visit: Payer: Self-pay | Admitting: Family Medicine

## 2018-10-18 DIAGNOSIS — S81811D Laceration without foreign body, right lower leg, subsequent encounter: Secondary | ICD-10-CM | POA: Diagnosis not present

## 2018-10-31 ENCOUNTER — Encounter (HOSPITAL_COMMUNITY): Payer: Medicare Other

## 2018-11-05 ENCOUNTER — Encounter (HOSPITAL_COMMUNITY): Payer: Self-pay

## 2018-11-09 ENCOUNTER — Other Ambulatory Visit: Payer: Self-pay

## 2018-11-09 ENCOUNTER — Encounter (HOSPITAL_COMMUNITY): Payer: Self-pay

## 2018-11-09 ENCOUNTER — Ambulatory Visit (HOSPITAL_COMMUNITY)
Admission: RE | Admit: 2018-11-09 | Discharge: 2018-11-09 | Disposition: A | Discharge status: Home / Self Care | Payer: Medicare Other | Source: Ambulatory Visit | Attending: Internal Medicine | Admitting: Internal Medicine

## 2018-11-09 VITALS — BP 144/88 | HR 88 | Wt 237.4 lb

## 2018-11-09 DIAGNOSIS — Z794 Long term (current) use of insulin: Secondary | ICD-10-CM | POA: Insufficient documentation

## 2018-11-09 DIAGNOSIS — E119 Type 2 diabetes mellitus without complications: Secondary | ICD-10-CM | POA: Insufficient documentation

## 2018-11-09 DIAGNOSIS — I10 Essential (primary) hypertension: Secondary | ICD-10-CM | POA: Diagnosis not present

## 2018-11-09 DIAGNOSIS — I110 Hypertensive heart disease with heart failure: Secondary | ICD-10-CM | POA: Insufficient documentation

## 2018-11-09 DIAGNOSIS — Z79899 Other long term (current) drug therapy: Secondary | ICD-10-CM | POA: Insufficient documentation

## 2018-11-09 DIAGNOSIS — E785 Hyperlipidemia, unspecified: Secondary | ICD-10-CM | POA: Diagnosis not present

## 2018-11-09 DIAGNOSIS — E875 Hyperkalemia: Secondary | ICD-10-CM | POA: Diagnosis not present

## 2018-11-09 DIAGNOSIS — F329 Major depressive disorder, single episode, unspecified: Secondary | ICD-10-CM | POA: Insufficient documentation

## 2018-11-09 DIAGNOSIS — Z7982 Long term (current) use of aspirin: Secondary | ICD-10-CM | POA: Diagnosis not present

## 2018-11-09 DIAGNOSIS — I428 Other cardiomyopathies: Secondary | ICD-10-CM | POA: Insufficient documentation

## 2018-11-09 DIAGNOSIS — I5022 Chronic systolic (congestive) heart failure: Secondary | ICD-10-CM | POA: Diagnosis not present

## 2018-11-09 DIAGNOSIS — H9190 Unspecified hearing loss, unspecified ear: Secondary | ICD-10-CM | POA: Diagnosis not present

## 2018-11-09 DIAGNOSIS — Z95810 Presence of automatic (implantable) cardiac defibrillator: Secondary | ICD-10-CM | POA: Diagnosis not present

## 2018-11-09 MED ORDER — FUROSEMIDE 80 MG PO TABS: 80.0000 mg | ORAL_TABLET | Freq: Every day | ORAL | 3 refills | Status: DC

## 2018-11-09 MED ORDER — HYDRALAZINE HCL 25 MG PO TABS: 37.5000 mg | ORAL_TABLET | Freq: Three times a day (TID) | ORAL | 3 refills | Status: DC

## 2018-11-09 NOTE — Patient Instructions (Signed)
INCREASE Furosemide 80g daily  INCREASE Hyrdalazine 37.5mg  (1.5 tab) three times daily  Please follow up with the [facility] next week.  At the [facility], you and your health needs are our priority. As part of our continuing mission to provide you with exceptional heart care, we have created designated Provider Care Teams. These Care Teams include your primary Cardiologist (physician) and Advanced Practice Providers (APPs- Physician Assistants and Nurse Practitioners) who all work together to provide you with the care you need, when you need it.   You may see any of the following providers on your designated Care Team at your next follow up: Marland Kitchen Dr Glori Bickers . Dr Loralie Champagne . Darrick Grinder, NP   Please be sure to bring in all your medications bottles to every appointment.

## 2018-11-09 NOTE — Progress Notes (Signed)
Advanced Heart Failure Clinic Note   PCP: Cammie Sickle Cardiology: Dr. Aundra Dubin  48 y.o. with deafness, HTN, DM, and chronic systolic CHF presents for followup of CHF.  Patient had no known cardiac problems until 10/16.  At that time, he developed exertional dyspnea and was admitted to a hospital in [place], [place] with acute systolic CHF.  Echo 10/16 showed EF 15-20%.  Cardiac cath showed no significant coronary disease.  He was diuresed and started on cardiac meds. Subsequently, he moved to [facility].  Now working in a [employer].  Repeat echo in 5/18 showed persistently low EF, 25-30%.  He had [place] placed.   Today he returns for HF follow up. Last visit he had been off HF medications for 6 months. Hydralazine was restarted at 25 mg three times a day. Overall feeling fair. + Orthopnea. SOB with exertion. Denies PND. Appetite ok. No fever or chills. Weight at home 230 pounds. Taking all medications. Plans to start working at [employer] next month.   Labs (9/17): BNP 477, K 4.5 => 4.2, creatinine 0.92 => 1.1, BNP 1009, HIV negative, SPEP negative.  Labs (10/17): K 5.2, creatinine 1.10, hgb 12.5 Labs (03/28/2016): K 5.3 Creatinine 1.07  Labs (3/18): digoxin < 0.2, K 4.1, creatinine 0.97 Labs (9/18): hgb 12.2, K 4, creatinine 0.99, BNP 139 Labs (1/19): hgb 11.2, K 4.8, creatinine 1.26 Alabs (09/04/18): K 5.1 Creatinine 1  PMH: 1. HTN 2. Diabetes 3. Hyperlipidemia 4. Deafness since age 39: Needs sign language interpreter.  5. Chronic systolic CHF: Nonischemic cardiomyopathy.  Diagnosis 10/16 in [place], [place] (admitted with acute systolic CHF).  SPEP and HIV negative.  [place].  - Echo (10/16): EF 15-20%, mild LVH, mild MR, normal RV size and systolic function.  - LHC (10/16): No significant CAD.  - CPX (12/17): Mild functional limitation.  - 04/2016 CMRI: EF 27%. RV normal. No evidence of infiltrative disease, myocarditis, or MI.  - Echo (5/18): EF 25-30%, mild  LV dilation, mild LVH, normal RV size with mildly decreased 6. Depression. 7. Colitis episodes.   SH: From [place] originally.  Lives in [place] until 2017, then moved to [place].  Nonsmoker.  Used to drink 3-4 ETOH beverages/day until 10/16, now stopped. Works in [employer].   FH: No known cardiac disease.   ROS: All systems reviewed and negative except as per HPI.   Current Outpatient Medications  Medication Sig Dispense Refill  . Accu-Chek Softclix Lancets lancets USE FOUR TIMES DAILY AS DIRECTED 400 each 3  . aspirin 81 MG chewable tablet Chew 81 mg by mouth daily.    Marland Kitchen atorvastatin (LIPITOR) 80 MG tablet Take 1 tablet (80 mg total) by mouth daily. 90 tablet 3  . blood glucose meter kit and supplies KIT Dispense based on patient and insurance preference. Use up to four times daily as directed. (FOR ICD-9 250.00, 250.01). 1 each 0  . Blood Glucose Monitoring Suppl (ACCU-CHEK AVIVA PLUS) w/Device KIT 1 each by Does not apply route 4 (four) times daily -  before meals and at bedtime. E.11.8 1 kit 0  . canagliflozin (INVOKANA) 300 MG TABS tablet Take 1 tablet (300 mg total) by mouth daily before breakfast. 90 tablet 3  . carvedilol (COREG) 12.5 MG tablet Take 1.5 tablets (18.75 mg total) by mouth 2 (two) times daily. 240 tablet 3  . citalopram (CELEXA) 40 MG tablet Take 1 tablet (40 mg total) by mouth daily. 90 tablet 1  . digoxin (LANOXIN) 0.125 MG  tablet Take 1 tablet (0.125 mg total) by mouth daily. 90 tablet 3  . furosemide (LASIX) 20 MG tablet Take 2 tablets (40 mg total) by mouth daily. 90 tablet 3  . glucose blood (ACCU-CHEK AVIVA PLUS) test strip Check blood three times per day and at bedtime 200 each 12  . hydrALAZINE (APRESOLINE) 25 MG tablet Take 1 tablet (25 mg total) by mouth 3 (three) times daily. 270 tablet 3  . Insulin Glargine (LANTUS SOLOSTAR) 100 UNIT/ML Solostar Pen Inject 40 Units into the skin daily at 10 pm. 15 pen 3  . isosorbide mononitrate (IMDUR) 30 MG 24 hr tablet  Take 1 tablet (30 mg total) by mouth daily. 90 tablet 3  . ivabradine (CORLANOR) 5 MG TABS tablet Take 1 tablet (5 mg total) by mouth 2 (two) times daily with a meal. 180 tablet 0  . Lancet Devices (ACCU-CHEK SOFTCLIX) lancets Use as instructed 1 each 0  . metFORMIN (GLUCOPHAGE) 1000 MG tablet Take 1 tablet (1,000 mg total) by mouth 2 (two) times daily with a meal. 180 tablet 3  . promethazine (PHENERGAN) 25 MG tablet Take 1 tablet (25 mg total) by mouth every 6 (six) hours as needed for nausea or vomiting. (Patient not taking: Reported on 10/08/2018) 30 tablet 0   No current facility-administered medications for this encounter.    BP (!) 144/88   Pulse 88   Wt 107.7 kg (237 lb 6.4 oz)   SpO2 99%   BMI 33.11 kg/m    Wt Readings from Last 3 Encounters:  11/09/18 107.7 kg (237 lb 6.4 oz)  10/08/18 107.1 kg (236 lb 3.2 oz)  12/06/17 98.4 kg (217 lb)  Deaf Interpreter present.   General:  Well appearing. No resp difficulty HEENT: normal Neck: supple. JVP 11-12 . Carotids 2+ bilat; no bruits. No lymphadenopathy or thryomegaly appreciated. Cor: PMI nondisplaced. Regular rate & rhythm. No rubs, gallops or murmurs. Lungs: clear Abdomen: soft, nontender, distended. No hepatosplenomegaly. No bruits or masses. Good bowel sounds. Extremities: no cyanosis, clubbing, rash, edema Neuro: alert & orientedx3, cranial nerves grossly intact. moves all 4 extremities w/o difficulty. Affect pleasant  Assessment/Plan: 1. Chronic systolic CHF: Nonischemic cardiomyopathy, etiology uncertain. Consider prior ETOH or HTN.  He has a [place].  CMRI in 1/18 with EF 27%, RV normal, no evidence of infiltrative disease, myocarditis, or MI.  Echo was done in 5/18, EF remained 25-30%.   - NYHA IIIb. Volume status elevated. Increase lasix to 80 mg daily. Check BMET next week. -Continue digoxin for now. Check dig level next week.  - Continue coreg 18.75 mg twice a day.   - Hold off on spiro and entresto  with recent hyperkalemia.  - Increase hydralazine to 37.5 mg three times a day - Continue imdur 30 mg daily.  -Continue  Corlanor at current doses. 2. Type II diabetes: Per PCP  3. HTN: See above.  4. Deafness: Sign language interpreter present at visit.  5. Hyperkalemia:  Follow up next week to reassess volume status and check BMET and dig level.  Greater than 50% of the (total minutes 25) visit spent in counseling/coordination of care regarding the above.      Darrick Grinder, NP  11/09/2018

## 2018-11-13 DIAGNOSIS — H2513 Age-related nuclear cataract, bilateral: Secondary | ICD-10-CM | POA: Diagnosis not present

## 2018-11-13 DIAGNOSIS — H25013 Cortical age-related cataract, bilateral: Secondary | ICD-10-CM | POA: Diagnosis not present

## 2018-11-13 DIAGNOSIS — E113293 Type 2 diabetes mellitus with mild nonproliferative diabetic retinopathy without macular edema, bilateral: Secondary | ICD-10-CM | POA: Diagnosis not present

## 2018-11-13 DIAGNOSIS — H40013 Open angle with borderline findings, low risk, bilateral: Secondary | ICD-10-CM | POA: Diagnosis not present

## 2018-11-14 ENCOUNTER — Other Ambulatory Visit: Payer: Self-pay

## 2018-11-14 ENCOUNTER — Observation Stay (HOSPITAL_COMMUNITY): Payer: Medicare Other

## 2018-11-14 ENCOUNTER — Emergency Department (HOSPITAL_COMMUNITY): Payer: Medicare Other

## 2018-11-14 ENCOUNTER — Encounter (HOSPITAL_COMMUNITY): Payer: Medicare Other

## 2018-11-14 ENCOUNTER — Observation Stay (HOSPITAL_COMMUNITY)
Admission: EM | Admit: 2018-11-14 | Discharge: 2018-11-15 | Disposition: A | Discharge status: Home / Self Care | Payer: Medicare Other | Attending: Internal Medicine | Admitting: Internal Medicine

## 2018-11-14 DIAGNOSIS — R109 Unspecified abdominal pain: Secondary | ICD-10-CM

## 2018-11-14 DIAGNOSIS — Z794 Long term (current) use of insulin: Secondary | ICD-10-CM | POA: Diagnosis not present

## 2018-11-14 DIAGNOSIS — I110 Hypertensive heart disease with heart failure: Secondary | ICD-10-CM | POA: Diagnosis not present

## 2018-11-14 DIAGNOSIS — H9190 Unspecified hearing loss, unspecified ear: Secondary | ICD-10-CM | POA: Diagnosis not present

## 2018-11-14 DIAGNOSIS — R9431 Abnormal electrocardiogram [ECG] [EKG]: Secondary | ICD-10-CM | POA: Insufficient documentation

## 2018-11-14 DIAGNOSIS — N62 Hypertrophy of breast: Secondary | ICD-10-CM | POA: Insufficient documentation

## 2018-11-14 DIAGNOSIS — R0602 Shortness of breath: Secondary | ICD-10-CM | POA: Diagnosis not present

## 2018-11-14 DIAGNOSIS — I509 Heart failure, unspecified: Secondary | ICD-10-CM | POA: Diagnosis not present

## 2018-11-14 DIAGNOSIS — Z95810 Presence of automatic (implantable) cardiac defibrillator: Secondary | ICD-10-CM | POA: Diagnosis not present

## 2018-11-14 DIAGNOSIS — E785 Hyperlipidemia, unspecified: Secondary | ICD-10-CM | POA: Diagnosis not present

## 2018-11-14 DIAGNOSIS — R740 Nonspecific elevation of levels of transaminase and lactic acid dehydrogenase [LDH]: Secondary | ICD-10-CM | POA: Diagnosis not present

## 2018-11-14 DIAGNOSIS — I5023 Acute on chronic systolic (congestive) heart failure: Secondary | ICD-10-CM | POA: Insufficient documentation

## 2018-11-14 DIAGNOSIS — I10 Essential (primary) hypertension: Secondary | ICD-10-CM | POA: Diagnosis present

## 2018-11-14 DIAGNOSIS — R112 Nausea with vomiting, unspecified: Secondary | ICD-10-CM | POA: Diagnosis not present

## 2018-11-14 DIAGNOSIS — I517 Cardiomegaly: Secondary | ICD-10-CM | POA: Diagnosis not present

## 2018-11-14 DIAGNOSIS — Z7982 Long term (current) use of aspirin: Secondary | ICD-10-CM | POA: Diagnosis not present

## 2018-11-14 DIAGNOSIS — R1013 Epigastric pain: Secondary | ICD-10-CM | POA: Insufficient documentation

## 2018-11-14 DIAGNOSIS — E1169 Type 2 diabetes mellitus with other specified complication: Secondary | ICD-10-CM

## 2018-11-14 DIAGNOSIS — Z20828 Contact with and (suspected) exposure to other viral communicable diseases: Secondary | ICD-10-CM | POA: Diagnosis not present

## 2018-11-14 DIAGNOSIS — E1165 Type 2 diabetes mellitus with hyperglycemia: Secondary | ICD-10-CM | POA: Diagnosis not present

## 2018-11-14 DIAGNOSIS — I428 Other cardiomyopathies: Secondary | ICD-10-CM | POA: Diagnosis not present

## 2018-11-14 DIAGNOSIS — R110 Nausea: Secondary | ICD-10-CM | POA: Diagnosis not present

## 2018-11-14 DIAGNOSIS — E119 Type 2 diabetes mellitus without complications: Secondary | ICD-10-CM | POA: Diagnosis not present

## 2018-11-14 DIAGNOSIS — F329 Major depressive disorder, single episode, unspecified: Secondary | ICD-10-CM | POA: Insufficient documentation

## 2018-11-14 DIAGNOSIS — E875 Hyperkalemia: Secondary | ICD-10-CM | POA: Diagnosis not present

## 2018-11-14 DIAGNOSIS — R071 Chest pain on breathing: Secondary | ICD-10-CM | POA: Diagnosis not present

## 2018-11-14 DIAGNOSIS — R1111 Vomiting without nausea: Secondary | ICD-10-CM | POA: Diagnosis not present

## 2018-11-14 DIAGNOSIS — Z8249 Family history of ischemic heart disease and other diseases of the circulatory system: Secondary | ICD-10-CM | POA: Diagnosis not present

## 2018-11-14 DIAGNOSIS — Z79899 Other long term (current) drug therapy: Secondary | ICD-10-CM | POA: Diagnosis not present

## 2018-11-14 LAB — COMPREHENSIVE METABOLIC PANEL
ALT: 46 U/L — ABNORMAL HIGH (ref 0–44)
AST: 60 U/L — ABNORMAL HIGH (ref 15–41)
Albumin: 3.6 g/dL (ref 3.5–5.0)
Alkaline Phosphatase: 54 U/L (ref 38–126)
Anion gap: 8 (ref 5–15)
BUN: 22 mg/dL — ABNORMAL HIGH (ref 6–20)
CO2: 24 mmol/L (ref 22–32)
Calcium: 8.7 mg/dL — ABNORMAL LOW (ref 8.9–10.3)
Chloride: 103 mmol/L (ref 98–111)
Creatinine, Ser: 1.3 mg/dL — ABNORMAL HIGH (ref 0.61–1.24)
GFR calc Af Amer: >60 mL/min (ref >60)
GFR calc non Af Amer: >60 mL/min (ref >60)
Glucose, Bld: 272 mg/dL — ABNORMAL HIGH (ref 70–99)
Potassium: 6 mmol/L — ABNORMAL HIGH (ref 3.5–5.1)
Sodium: 135 mmol/L (ref 135–145)
Total Bilirubin: 1.5 mg/dL — ABNORMAL HIGH (ref 0.3–1.2)
Total Protein: 7.2 g/dL (ref 6.5–8.1)

## 2018-11-14 LAB — CBC
HCT: 34.3 % — ABNORMAL LOW (ref 39.0–52.0)
Hemoglobin: 10.2 g/dL — ABNORMAL LOW (ref 13.0–17.0)
MCH: 26.8 pg (ref 26.0–34.0)
MCHC: 29.7 g/dL — ABNORMAL LOW (ref 30.0–36.0)
MCV: 90 fL (ref 80.0–100.0)
Platelets: 200 10*3/uL (ref 150–400)
RBC: 3.81 MIL/uL — ABNORMAL LOW (ref 4.22–5.81)
RDW: 13.5 % (ref 11.5–15.5)
WBC: 7.5 10*3/uL (ref 4.0–10.5)
nRBC: 1.2 % — ABNORMAL HIGH (ref 0.0–0.2)

## 2018-11-14 LAB — URINALYSIS, ROUTINE W REFLEX MICROSCOPIC
Bacteria, UA: NONE SEEN
Bilirubin Urine: NEGATIVE
Glucose, UA: 50 mg/dL — AB
Hgb urine dipstick: NEGATIVE
Ketones, ur: NEGATIVE mg/dL
Leukocytes,Ua: NEGATIVE
Nitrite: NEGATIVE
Protein, ur: >=300 mg/dL — AB
Specific Gravity, Urine: 1.014 (ref 1.005–1.030)
pH: 5 (ref 5.0–8.0)

## 2018-11-14 LAB — LIPASE, BLOOD: Lipase: 40 U/L (ref 11–51)

## 2018-11-14 LAB — CBG MONITORING, ED
Glucose-Capillary: 234 mg/dL — ABNORMAL HIGH (ref 70–99)
Glucose-Capillary: 162 mg/dL — ABNORMAL HIGH (ref 70–99)

## 2018-11-14 LAB — DIGOXIN LEVEL: Digoxin Level: <0.2 ng/mL — ABNORMAL LOW (ref 0.8–2.0)

## 2018-11-14 LAB — TROPONIN I (HIGH SENSITIVITY)
Troponin I (High Sensitivity): 12 ng/L (ref <18)
Troponin I (High Sensitivity): 14 ng/L (ref <18)

## 2018-11-14 LAB — CREATININE, SERUM
Creatinine, Ser: 1.46 mg/dL — ABNORMAL HIGH (ref 0.61–1.24)
GFR calc Af Amer: >60 mL/min (ref >60)
GFR calc non Af Amer: 56 mL/min — ABNORMAL LOW (ref >60)

## 2018-11-14 LAB — GLUCOSE, CAPILLARY: Glucose-Capillary: 119 mg/dL — ABNORMAL HIGH (ref 70–99)

## 2018-11-14 LAB — BRAIN NATRIURETIC PEPTIDE: B Natriuretic Peptide: 532.4 pg/mL — ABNORMAL HIGH (ref 0.0–100.0)

## 2018-11-14 LAB — SARS CORONAVIRUS 2 BY RT PCR (HOSPITAL ORDER, PERFORMED IN ~~LOC~~ HOSPITAL LAB): SARS Coronavirus 2: NEGATIVE

## 2018-11-14 LAB — POTASSIUM: Potassium: 4.2 mmol/L (ref 3.5–5.1)

## 2018-11-14 MED ORDER — CITALOPRAM HYDROBROMIDE 20 MG PO TABS
40.0000 mg | ORAL_TABLET | Freq: Every day | ORAL | Status: DC
  Administered 2018-11-15: 40 mg via ORAL
  Filled 2018-11-14: qty 2

## 2018-11-14 MED ORDER — ATORVASTATIN CALCIUM 80 MG PO TABS: 80.0000 mg | ORAL_TABLET | Freq: Every day | ORAL | Status: DC

## 2018-11-14 MED ORDER — ISOSORBIDE MONONITRATE ER 30 MG PO TB24
30.0000 mg | ORAL_TABLET | Freq: Every day | ORAL | Status: DC
  Administered 2018-11-15: 30 mg via ORAL
  Filled 2018-11-14: qty 1

## 2018-11-14 MED ORDER — FENTANYL CITRATE (PF) 100 MCG/2ML IJ SOLN
50.0000 ug | Freq: Once | INTRAMUSCULAR | Status: AC
  Administered 2018-11-14: 13:00:00 50 ug via INTRAVENOUS
  Filled 2018-11-14: qty 2

## 2018-11-14 MED ORDER — CARVEDILOL 6.25 MG PO TABS
18.7500 mg | ORAL_TABLET | Freq: Two times a day (BID) | ORAL | Status: DC
  Administered 2018-11-14 – 2018-11-15 (×2): 18.75 mg via ORAL
  Filled 2018-11-14 (×2): qty 1

## 2018-11-14 MED ORDER — PROCHLORPERAZINE EDISYLATE 10 MG/2ML IJ SOLN
10.0000 mg | Freq: Once | INTRAMUSCULAR | Status: AC
  Administered 2018-11-14: 13:00:00 10 mg via INTRAVENOUS
  Filled 2018-11-14: qty 2

## 2018-11-14 MED ORDER — HYDRALAZINE HCL 25 MG PO TABS
37.5000 mg | ORAL_TABLET | Freq: Three times a day (TID) | ORAL | Status: DC
  Administered 2018-11-14 – 2018-11-15 (×2): 37.5 mg via ORAL
  Filled 2018-11-14 (×2): qty 2

## 2018-11-14 MED ORDER — INSULIN GLARGINE 100 UNIT/ML SOLOSTAR PEN: 25.0000 [IU] | PEN_INJECTOR | Freq: Every day | SUBCUTANEOUS | Status: DC

## 2018-11-14 MED ORDER — CITALOPRAM HYDROBROMIDE 10 MG PO TABS
40.0000 mg | ORAL_TABLET | Freq: Once | ORAL | Status: AC
  Administered 2018-11-14: 20:00:00 40 mg via ORAL
  Filled 2018-11-14: qty 4

## 2018-11-14 MED ORDER — ATORVASTATIN CALCIUM 80 MG PO TABS
80.0000 mg | ORAL_TABLET | Freq: Once | ORAL | Status: AC
  Administered 2018-11-14: 20:00:00 80 mg via ORAL
  Filled 2018-11-14: qty 1

## 2018-11-14 MED ORDER — ISOSORBIDE MONONITRATE ER 30 MG PO TB24: 30.0000 mg | ORAL_TABLET | Freq: Every day | ORAL | Status: DC

## 2018-11-14 MED ORDER — ASPIRIN 81 MG PO CHEW
81.0000 mg | CHEWABLE_TABLET | Freq: Once | ORAL | Status: AC
  Administered 2018-11-14: 20:00:00 81 mg via ORAL
  Filled 2018-11-14: qty 1

## 2018-11-14 MED ORDER — FUROSEMIDE 10 MG/ML IJ SOLN
60.0000 mg | Freq: Two times a day (BID) | INTRAMUSCULAR | Status: DC
  Administered 2018-11-14 – 2018-11-15 (×2): 60 mg via INTRAVENOUS
  Filled 2018-11-14 (×2): qty 6

## 2018-11-14 MED ORDER — SODIUM ZIRCONIUM CYCLOSILICATE 10 G PO PACK
10.0000 g | PACK | Freq: Once | ORAL | Status: AC
  Administered 2018-11-14: 16:00:00 10 g via ORAL
  Filled 2018-11-14: qty 1

## 2018-11-14 MED ORDER — DIGOXIN 125 MCG PO TABS: 0.1250 mg | ORAL_TABLET | Freq: Every day | ORAL | Status: DC

## 2018-11-14 MED ORDER — INSULIN ASPART 100 UNIT/ML ~~LOC~~ SOLN: 0.0000 [IU] | Freq: Every day | SUBCUTANEOUS | Status: DC

## 2018-11-14 MED ORDER — CITALOPRAM HYDROBROMIDE 10 MG PO TABS: 40.0000 mg | ORAL_TABLET | Freq: Every day | ORAL | Status: DC

## 2018-11-14 MED ORDER — ISOSORBIDE MONONITRATE ER 30 MG PO TB24
30.0000 mg | ORAL_TABLET | Freq: Once | ORAL | Status: AC
  Administered 2018-11-14: 20:00:00 30 mg via ORAL
  Filled 2018-11-14: qty 1

## 2018-11-14 MED ORDER — SODIUM POLYSTYRENE SULFONATE 15 GM/60ML PO SUSP
30.0000 g | Freq: Once | ORAL | Status: AC
  Administered 2018-11-14: 30 g via ORAL
  Filled 2018-11-14: qty 120

## 2018-11-14 MED ORDER — DIGOXIN 125 MCG PO TABS
0.1250 mg | ORAL_TABLET | Freq: Every day | ORAL | Status: DC
  Administered 2018-11-15: 0.125 mg via ORAL
  Filled 2018-11-14: qty 1

## 2018-11-14 MED ORDER — FUROSEMIDE 10 MG/ML IJ SOLN
60.0000 mg | Freq: Once | INTRAMUSCULAR | Status: AC
  Administered 2018-11-14: 16:00:00 60 mg via INTRAVENOUS
  Filled 2018-11-14: qty 6

## 2018-11-14 MED ORDER — ASPIRIN 81 MG PO CHEW: 81.0000 mg | CHEWABLE_TABLET | Freq: Every day | ORAL | Status: DC

## 2018-11-14 MED ORDER — IVABRADINE HCL 5 MG PO TABS
5.0000 mg | ORAL_TABLET | Freq: Two times a day (BID) | ORAL | Status: DC
  Administered 2018-11-15: 5 mg via ORAL
  Filled 2018-11-14 (×2): qty 1

## 2018-11-14 MED ORDER — INSULIN ASPART 100 UNIT/ML ~~LOC~~ SOLN
0.0000 [IU] | Freq: Three times a day (TID) | SUBCUTANEOUS | Status: DC
  Administered 2018-11-14 – 2018-11-15 (×2): 5 [IU] via SUBCUTANEOUS

## 2018-11-14 MED ORDER — INSULIN GLARGINE 100 UNIT/ML ~~LOC~~ SOLN
25.0000 [IU] | Freq: Every day | SUBCUTANEOUS | Status: DC
  Administered 2018-11-14: 25 [IU] via SUBCUTANEOUS
  Filled 2018-11-14 (×2): qty 0.25

## 2018-11-14 MED ORDER — SODIUM CHLORIDE 0.9% FLUSH
3.0000 mL | Freq: Once | INTRAVENOUS | Status: AC
  Administered 2018-11-14: 14:00:00 3 mL via INTRAVENOUS

## 2018-11-14 MED ORDER — HEPARIN SODIUM (PORCINE) 5000 UNIT/ML IJ SOLN
5000.0000 [IU] | Freq: Three times a day (TID) | INTRAMUSCULAR | Status: DC
  Administered 2018-11-14 – 2018-11-15 (×2): 5000 [IU] via SUBCUTANEOUS
  Filled 2018-11-14 (×2): qty 1

## 2018-11-14 MED ORDER — ASPIRIN 81 MG PO CHEW
81.0000 mg | CHEWABLE_TABLET | Freq: Every day | ORAL | Status: DC
  Administered 2018-11-15: 81 mg via ORAL
  Filled 2018-11-14: qty 1

## 2018-11-14 MED ORDER — DIGOXIN 125 MCG PO TABS
0.1250 mg | ORAL_TABLET | Freq: Once | ORAL | Status: AC
  Administered 2018-11-14: 20:00:00 0.125 mg via ORAL
  Filled 2018-11-14: qty 1

## 2018-11-14 NOTE — ED Triage Notes (Signed)
Onset this morning abd pain, N/V.   EMS gave Zofran 4mg , nausea decreased.  CBG 256 BP 136/58 HR 84 Pt is deaf, can write or sign.

## 2018-11-14 NOTE — ED Notes (Signed)
Admit doctor at bedside

## 2018-11-14 NOTE — ED Provider Notes (Addendum)
[facility] EMERGENCY DEPARTMENT Provider Note   CSN: [id number] Arrival date & time: 11/14/18  1210    History   Chief Complaint Chief Complaint  Patient presents with  . Abdominal Pain  . Emesis  . Shortness of Breath    HPI Troy Rush is a 48 y.o. male.     The history is provided by the patient.  Abdominal Pain Pain location:  Generalized Pain quality: aching   Pain radiates to:  Does not radiate Pain severity:  Mild Onset quality:  Gradual Duration:  2 days Timing:  Constant Chronicity:  New Context: not eating   Relieved by:  Nothing Worsened by:  Nothing Associated symptoms: nausea, shortness of breath and vomiting   Associated symptoms: no chest pain, no chills, no constipation, no cough, no diarrhea, no dysuria, no fever, no hematuria and no sore throat   Risk factors comment:  Hx of DM, CHF, pancreatitis   Past Medical History:  Diagnosis Date  . AICD (automatic cardioverter/defibrillator) present 10/14/2016  . Bronchitis   . CHF (congestive heart failure) ([place])   . Depression   . Diabetes mellitus without complication ([place])   . Dyslipidemia   . Hypertension   . NICM (nonischemic cardiomyopathy) ([place]) 09/15/2016  . Pancreatitis     Patient Active Problem List   Diagnosis Date Noted  . Ischemic cardiomyopathy 10/14/2016  . NICM (nonischemic cardiomyopathy) ([place]) 09/15/2016  . Chronic systolic CHF (congestive heart failure) ([place]) 05/30/2016  . HTN (hypertension) 05/30/2016  . Deaf 05/30/2016  . Hyperkalemia 05/30/2016  . Diabetes ([place]) 03/21/2016  . Major depressive disorder, recurrent episode ([place]) 03/21/2016    Past Surgical History:  Procedure Laterality Date  . BACK SURGERY    . CARDIAC CATHETERIZATION    . ICD IMPLANT  10/14/2016  . ICD IMPLANT N/A 10/14/2016   Procedure: ICD Implant;  Surgeon: Deboraha Sprang, MD;  Location: [facility];  Service: Cardiovascular;  Laterality: N/A;  . WRIST SURGERY           Home Medications    Prior to Admission medications   Medication Sig Start Date End Date Taking? Authorizing Provider  Accu-Chek Softclix Lancets lancets USE FOUR TIMES DAILY AS DIRECTED 10/11/18   Lanae Boast, FNP  aspirin 81 MG chewable tablet Chew 81 mg by mouth daily.    [provider]  atorvastatin (LIPITOR) 80 MG tablet Take 1 tablet (80 mg total) by mouth daily. 07/13/18   Lanae Boast, FNP  blood glucose meter kit and supplies KIT Dispense based on patient and insurance preference. Use up to four times daily as directed. (FOR ICD-9 250.00, 250.01). 03/21/16   Micheline Chapman, NP  Blood Glucose Monitoring Suppl (ACCU-CHEK AVIVA PLUS) w/Device KIT 1 each by Does not apply route 4 (four) times daily -  before meals and at bedtime. E.11.8 07/13/18   Lanae Boast, FNP  canagliflozin [facility]) 300 MG TABS tablet Take 1 tablet (300 mg total) by mouth daily before breakfast. 07/13/18   Lanae Boast, FNP  carvedilol (COREG) 12.5 MG tablet Take 1.5 tablets (18.75 mg total) by mouth 2 (two) times daily. 08/17/18 08/12/19  Larey Dresser, MD  citalopram (CELEXA) 40 MG tablet Take 1 tablet (40 mg total) by mouth daily. 12/13/16   Scot Jun, FNP  digoxin (LANOXIN) 0.125 MG tablet Take 1 tablet (0.125 mg total) by mouth daily. 08/27/18   Georgiana Shore, NP  furosemide (LASIX) 80 MG tablet Take 1 tablet (80  mg total) by mouth daily. 11/09/18   Clegg, Amy D, NP  glucose blood (ACCU-CHEK AVIVA PLUS) test strip Check blood three times per day and at bedtime 05/09/17   Scot Jun, FNP  hydrALAZINE (APRESOLINE) 25 MG tablet Take 1.5 tablets (37.5 mg total) by mouth 3 (three) times daily. 11/09/18 02/07/19  Darrick Grinder D, NP  Insulin Glargine (LANTUS SOLOSTAR) 100 UNIT/ML Solostar Pen Inject 40 Units into the skin daily at 10 pm. 07/13/18   Lanae Boast, FNP  isosorbide mononitrate (IMDUR) 30 MG 24 hr tablet Take 1 tablet (30 mg total) by mouth daily. 10/08/18 10/03/19  Darrick Grinder D, NP   ivabradine (CORLANOR) 5 MG TABS tablet Take 1 tablet (5 mg total) by mouth 2 (two) times daily with a meal. 08/27/18   Georgiana Shore, NP  Lancet Devices [facility]) lancets Use as instructed 10/17/16   Dorena Dew, FNP  metFORMIN (GLUCOPHAGE) 1000 MG tablet Take 1 tablet (1,000 mg total) by mouth 2 (two) times daily with a meal. 07/13/18   Lanae Boast, FNP  promethazine (PHENERGAN) 25 MG tablet Take 1 tablet (25 mg total) by mouth every 6 (six) hours as needed for nausea or vomiting. Patient not taking: Reported on 10/08/2018 02/02/16   Shary Decamp, PA-C    Family History Family History  Problem Relation Age of Onset  . Hypertension Mother     Social History Social History   Tobacco Use  . Smoking status: Never Smoker  . Smokeless tobacco: Never Used  Substance Use Topics  . Alcohol use: Yes    Comment: rare  . Drug use: No     Allergies   Lisinopril   Review of Systems Review of Systems  Constitutional: Negative for chills and fever.  HENT: Negative for ear pain and sore throat.   Eyes: Negative for pain and visual disturbance.  Respiratory: Positive for shortness of breath. Negative for cough.   Cardiovascular: Positive for leg swelling. Negative for chest pain and palpitations.  Gastrointestinal: Positive for abdominal pain, nausea and vomiting. Negative for constipation and diarrhea.  Genitourinary: Negative for dysuria and hematuria.  Musculoskeletal: Negative for arthralgias and back pain.  Skin: Negative for color change and rash.  Neurological: Negative for seizures and syncope.  All other systems reviewed and are negative.    Physical Exam Updated Vital Signs  ED Triage Vitals [11/14/18 1211]  Enc Vitals Group     BP (!) 142/92     Pulse Rate 85     Resp 17     Temp (!) 97.3 F (36.3 C)     Temp Source Oral     SpO2 94 %     Weight      Height      Head Circumference      Peak Flow      Pain Score      Pain Loc      Pain Edu?       Excl. in [place]?     Physical Exam Vitals signs and nursing note reviewed.  Constitutional:      General: He is not in acute distress.    Appearance: He is well-developed. He is ill-appearing.  HENT:     Head: Normocephalic and atraumatic.  Eyes:     Extraocular Movements: Extraocular movements intact.     Conjunctiva/sclera: Conjunctivae normal.     Pupils: Pupils are equal, round, and reactive to light.  Neck:     Musculoskeletal: Neck supple.  Cardiovascular:     Rate and Rhythm: Normal rate and regular rhythm.     Heart sounds: Normal heart sounds. No murmur.  Pulmonary:     Effort: Pulmonary effort is normal. No respiratory distress.     Breath sounds: Rales present.  Abdominal:     General: There is no distension.     Palpations: Abdomen is soft.     Tenderness: There is abdominal tenderness in the epigastric area. There is no right CVA tenderness, left CVA tenderness, guarding or rebound. Negative signs include Murphy's sign and Rovsing's sign.  Skin:    General: Skin is warm and dry.     Capillary Refill: Capillary refill takes less than 2 seconds.     Comments: 1+ pitting edema b/l  Neurological:     General: No focal deficit present.     Mental Status: He is alert.      ED Treatments / Results  Labs (all labs ordered are listed, but only abnormal results are displayed) Labs Reviewed  COMPREHENSIVE METABOLIC PANEL - Abnormal; Notable for the following components:      Result Value   Potassium 6.0 (*)    Glucose, Bld 272 (*)    BUN 22 (*)    Creatinine, Ser 1.30 (*)    Calcium 8.7 (*)    AST 60 (*)    ALT 46 (*)    Total Bilirubin 1.5 (*)    All other components within normal limits  CBC - Abnormal; Notable for the following components:   RBC 3.81 (*)    Hemoglobin 10.2 (*)    HCT 34.3 (*)    MCHC 29.7 (*)    nRBC 1.2 (*)    All other components within normal limits  BRAIN NATRIURETIC PEPTIDE - Abnormal; Notable for the following components:   B  Natriuretic Peptide 532.4 (*)    All other components within normal limits  DIGOXIN LEVEL - Abnormal; Notable for the following components:   Digoxin Level <0.2 (*)    All other components within normal limits  SARS CORONAVIRUS 2 (HOSPITAL ORDER, [place] [facility])  LIPASE, BLOOD  URINALYSIS, ROUTINE W REFLEX MICROSCOPIC  TROPONIN I (HIGH SENSITIVITY)  TROPONIN I (HIGH SENSITIVITY)    EKG EKG would not pull over into MUSE.  EKG shows sinus rhythm.  No ischemic changes.  QTC mildly prolonged at 508.  Radiology Dg Chest Portable 1 View  Result Date: 11/14/2018 CLINICAL DATA:  Shortness of air chest pain EXAM: PORTABLE CHEST 1 VIEW COMPARISON:  October 15, 2016 FINDINGS: Single cardiac pacemaker in stable position. Enlarged cardiac silhouette.  Mediastinal contours appear intact. There is no evidence of focal airspace consolidation, pleural effusion or pneumothorax. Probable mild pulmonary vascular congestion. Osseous structures are without acute abnormality. Soft tissues are grossly normal. IMPRESSION: Markedly enlarged cardiac silhouette. This may represent cardiomyopathy or pericardial effusion. Probable mild pulmonary vascular congestion. Electronically Signed   By: Fidela Salisbury M.D.   On: 11/14/2018 14:09    Procedures .Critical Care Performed by: Lennice Sites, DO Authorized by: Lennice Sites, DO   Critical care provider statement:    Critical care time (minutes):  35   Critical care was necessary to treat or prevent imminent or life-threatening deterioration of the following conditions:  Metabolic crisis   Critical care was time spent personally by me on the following activities:  Blood draw for specimens, development of treatment plan with patient or surrogate, discussions with primary provider, obtaining history from patient or  surrogate, ordering and review of laboratory studies, ordering and performing treatments and interventions, ordering and review of  radiographic studies, pulse oximetry, re-evaluation of patient's condition and review of old charts   I assumed direction of critical care for this patient from another provider in my specialty: no     (including critical care time)  Medications Ordered in ED Medications  sodium zirconium cyclosilicate (LOKELMA) packet 10 g (has no administration in time range)  furosemide (LASIX) injection 60 mg (has no administration in time range)  sodium chloride flush (NS) 0.9 % injection 3 mL (3 mLs Intravenous Given 11/14/18 1337)  prochlorperazine (COMPAZINE) injection 10 mg (10 mg Intravenous Given 11/14/18 1324)  fentaNYL (SUBLIMAZE) injection 50 mcg (50 mcg Intravenous Given 11/14/18 1327)     Initial Impression / Assessment and Plan / ED Course  I have reviewed the triage vital signs and the nursing notes.  Pertinent labs & imaging results that were available during my care of the patient were reviewed by me and considered in my medical decision making (see chart for details).        Troy Rush is a 48 year old male with history of deafness, heart failure, diabetes who presents the ED with shortness of breath, abdominal pain, nausea, vomiting for the last 2 days.  Patient with some epigastric abdominal pain and shortness of breath.  Denies any chest pain.  States has been compliant with his medications.  Some minimal leg swelling on exam.  Some tenderness in the epigastric region on exam.  Overall breath sounds are clear but difficult to listen to due to habitus.  Maybe some rales in the bases.  Patient appears uncomfortable.  Will evaluate for volume overload, DKA, pancreatitis with lab work.  Denies any infectious symptoms.  Patient with pulmonary congestion on chest x-ray.  BNP is elevated.  Troponin however is normal.  Patient with potassium of 6 but will treat with Lasix and Lokelma is EKG is reassuring.  Patient overall appears volume overloaded.  Blood sugar is elevated.  Creatinine is up  trending.  Overall we will give IV Lasix for volume overload.  Believe he warrants admission for heart failure exacerbation now that he has some metabolic derangements.  Patient did follow-up with his heart failure clinic several days ago and they made adjustments to his medications but it appears that he is still not back at his baseline.  Lipase however is normal.  Doubt pancreatitis or other acute intra-abdominal process.  Will admit to medicine for further care.  This chart was dictated using voice recognition software.  Despite best efforts to proofread,  errors can occur which can change the documentation meaning.    Final Clinical Impressions(s) / ED Diagnoses   Final diagnoses:  Acute on chronic congestive heart failure, unspecified heart failure type [facility])  Hyperkalemia    ED Discharge Orders    None       Lennice Sites, DO 11/14/18 [place], [place], DO 11/20/18 2002

## 2018-11-14 NOTE — H&P (Addendum)
TRH H&P   Patient Demographics:    Troy Rush, is a 48 y.o. male  MRN: [id number]   DOB - [birth year]  Admit Date - 11/14/2018  Outpatient Primary MD for the patient is Lanae Boast, [place]  Referring MD/NP/PA: Dr Ronnald Nian  Outpatient Specialists: [facility]  Patient coming from: Home  Chief Complaint  Patient presents with  . Abdominal Pain  . Emesis  . Shortness of Breath      HPI:    Troy Rush  is a 48 y.o. male, with deafness, history of hypertension, diabetes mellitus, chronic systolic CHF, EF 25 to 54%, has AICD, patient follows with CHF clinic, apparently was not taking his meds for a while, was recently resumed on his meds by CHF clinic eluding digoxin, Coreg Dralzine, Imdur and Cholanor, on Entresto secondary to recurrent hyperkalemia. -Patient presents to ED secondary to complaints of abdominal pain, emesis and shortness of breath, reports abdominal pain for last 24 hours, mainly epigastric, reports some nausea, denies any coffee-ground emesis, no diarrhea or constipation, reports dyspnea over the last 2 days, mainly orthopnea, denies any chest pain, fever, chills, denies any headache. - in ED patient was found to have equally on imaging, with some vascular congestion, elevated BNP at 532, calcium of 6, and creatinine of 1.3.    Patient is deaf, tele-video interpreter/sign language was used   Review of systems:    In addition to the HPI above, No Fever-chills, No Headache, No changes with Vision or hearing, No problems swallowing food or Liquids, No Chest pain, does report some dyspnea Planes of abdominal pain, nausea and vomiting,  bowel movements are regular, No Blood in stool or Urine, No dysuria, No new skin rashes or bruises, No new joints pains-aches,  No new weakness, tingling, numbness in any extremity, No recent weight gain or loss, No polyuria,  polydypsia or polyphagia, No significant Mental Stressors.  A full 10 point Review of Systems was done, except as stated above, all other Review of Systems were negative.   With Past History of the following :    Past Medical History:  Diagnosis Date  . AICD (automatic cardioverter/defibrillator) present 10/14/2016  . Bronchitis   . CHF (congestive heart failure) ([place])   . Depression   . Diabetes mellitus without complication ([place])   . Dyslipidemia   . Hypertension   . NICM (nonischemic cardiomyopathy) ([place]) 09/15/2016  . Pancreatitis       Past Surgical History:  Procedure Laterality Date  . BACK SURGERY    . CARDIAC CATHETERIZATION    . ICD IMPLANT  10/14/2016  . ICD IMPLANT N/A 10/14/2016   Procedure: ICD Implant;  Surgeon: Deboraha Sprang, MD;  Location: [facility];  Service: Cardiovascular;  Laterality: N/A;  . WRIST SURGERY        Social History:     Social History   Tobacco  Use  . Smoking status: Never Smoker  . Smokeless tobacco: Never Used  Substance Use Topics  . Alcohol use: Yes    Comment: rare       Family History :     Family History  Problem Relation Age of Onset  . Hypertension Mother      Home Medications:   Prior to Admission medications   Medication Sig Start Date End Date Taking? Authorizing Provider  Accu-Chek Softclix Lancets lancets USE FOUR TIMES DAILY AS DIRECTED 10/11/18   Lanae Boast, FNP  aspirin 81 MG chewable tablet Chew 81 mg by mouth daily.    [provider]  atorvastatin (LIPITOR) 80 MG tablet Take 1 tablet (80 mg total) by mouth daily. 07/13/18   Lanae Boast, FNP  blood glucose meter kit and supplies KIT Dispense based on patient and insurance preference. Use up to four times daily as directed. (FOR ICD-9 250.00, 250.01). 03/21/16   Micheline Chapman, NP  Blood Glucose Monitoring Suppl (ACCU-CHEK AVIVA PLUS) w/Device KIT 1 each by Does not apply route 4 (four) times daily -  before meals and at bedtime.  E.11.8 07/13/18   Lanae Boast, FNP  canagliflozin [facility]) 300 MG TABS tablet Take 1 tablet (300 mg total) by mouth daily before breakfast. 07/13/18   Lanae Boast, FNP  carvedilol (COREG) 12.5 MG tablet Take 1.5 tablets (18.75 mg total) by mouth 2 (two) times daily. 08/17/18 08/12/19  Larey Dresser, MD  citalopram (CELEXA) 40 MG tablet Take 1 tablet (40 mg total) by mouth daily. 12/13/16   Scot Jun, FNP  digoxin (LANOXIN) 0.125 MG tablet Take 1 tablet (0.125 mg total) by mouth daily. 08/27/18   Georgiana Shore, NP  furosemide (LASIX) 80 MG tablet Take 1 tablet (80 mg total) by mouth daily. 11/09/18   Clegg, Amy D, NP  glucose blood (ACCU-CHEK AVIVA PLUS) test strip Check blood three times per day and at bedtime 05/09/17   Scot Jun, FNP  hydrALAZINE (APRESOLINE) 25 MG tablet Take 1.5 tablets (37.5 mg total) by mouth 3 (three) times daily. 11/09/18 02/07/19  Darrick Grinder D, NP  Insulin Glargine (LANTUS SOLOSTAR) 100 UNIT/ML Solostar Pen Inject 40 Units into the skin daily at 10 pm. 07/13/18   Lanae Boast, FNP  isosorbide mononitrate (IMDUR) 30 MG 24 hr tablet Take 1 tablet (30 mg total) by mouth daily. 10/08/18 10/03/19  Darrick Grinder D, NP  ivabradine (CORLANOR) 5 MG TABS tablet Take 1 tablet (5 mg total) by mouth 2 (two) times daily with a meal. 08/27/18   Georgiana Shore, NP  Lancet Devices [facility]) lancets Use as instructed 10/17/16   Dorena Dew, FNP  metFORMIN (GLUCOPHAGE) 1000 MG tablet Take 1 tablet (1,000 mg total) by mouth 2 (two) times daily with a meal. 07/13/18   Lanae Boast, FNP  promethazine (PHENERGAN) 25 MG tablet Take 1 tablet (25 mg total) by mouth every 6 (six) hours as needed for nausea or vomiting. Patient not taking: Reported on 10/08/2018 02/02/16   Shary Decamp, PA-C     Allergies:     Allergies  Allergen Reactions  . Lisinopril Cough     Physical Exam:   Vitals  Blood pressure (!) 143/98, pulse 95, temperature (!) 97.3 F (36.3 C),  temperature source Oral, resp. rate 16, SpO2 95 %.   1. General developed male, laying in bed, in no apparent distress  2. Normal affect and insight, Not Suicidal or Homicidal, Awake Alert, Oriented X  3.  3. No F.N deficits, ALL C.Nerves Intact, Strength 5/5 all 4 extremities, Sensation intact all 4 extremities, Plantars down going.  4. Ears and Eyes appear Normal, Conjunctivae clear, PERRLA. Moist Oral Mucosa.  5. Supple Neck, +JVD, No cervical lymphadenopathy appriciated, No Carotid Bruits.  6. Symmetrical Chest wall movement, Minister entry at the bases with some crackles  7. RRR, No Gallops, Rubs or Murmurs, No Parasternal Heave.  Has pitting edema  8. Positive Bowel Sounds, Abdomen Soft, No tenderness, No organomegaly appriciated,No rebound -guarding or rigidity.  9.  No Cyanosis, Normal Skin Turgor, No Skin Rash or Bruise.  10. Good muscle tone,  joints appear normal , no effusions, Normal ROM.  11. No Palpable Lymph Nodes in Neck or Axillae     Data Review:    CBC Recent Labs  Lab 11/14/18 1234  WBC 7.5  HGB 10.2*  HCT 34.3*  PLT 200  MCV 90.0  MCH 26.8  MCHC 29.7*  RDW 13.5   ------------------------------------------------------------------------------------------------------------------  Chemistries  Recent Labs  Lab 11/14/18 1234  NA 135  K 6.0*  CL 103  CO2 24  GLUCOSE 272*  BUN 22*  CREATININE 1.30*  CALCIUM 8.7*  AST 60*  ALT 46*  ALKPHOS 54  BILITOT 1.5*   ------------------------------------------------------------------------------------------------------------------ CrCl cannot be calculated (Unknown ideal weight.). ------------------------------------------------------------------------------------------------------------------ No results for input(s): TSH, T4TOTAL, T3FREE, THYROIDAB in the last 72 hours.  Invalid input(s): FREET3  Coagulation profile No results for input(s): INR, PROTIME in the last 168 hours.  ------------------------------------------------------------------------------------------------------------------- No results for input(s): DDIMER in the last 72 hours. -------------------------------------------------------------------------------------------------------------------  Cardiac Enzymes No results for input(s): CKMB, TROPONINI, MYOGLOBIN in the last 168 hours.  Invalid input(s): CK ------------------------------------------------------------------------------------------------------------------    Component Value Date/Time   BNP 532.4 (H) 11/14/2018 1336   BNP 139.4 (H) 12/13/2016 1202     ---------------------------------------------------------------------------------------------------------------  Urinalysis    Component Value Date/Time   COLORURINE AMBER (A) 01/13/2016 2340   APPEARANCEUR CLEAR 01/13/2016 2340   LABSPEC 1.010 05/09/2017 1130   PHURINE 5.0 05/09/2017 1130   GLUCOSEU 100 (A) 05/09/2017 1130   HGBUR NEGATIVE 05/09/2017 1130   BILIRUBINUR neg 12/06/2017 1352   KETONESUR NEGATIVE 05/09/2017 1130   PROTEINUR Positive (A) 12/06/2017 1352   PROTEINUR NEGATIVE 05/09/2017 1130   UROBILINOGEN 0.2 12/06/2017 1352   UROBILINOGEN 0.2 05/09/2017 1130   NITRITE neg 12/06/2017 1352   NITRITE NEGATIVE 05/09/2017 1130   LEUKOCYTESUR Negative 12/06/2017 1352    ----------------------------------------------------------------------------------------------------------------   Imaging Results:    Dg Chest Portable 1 View  Result Date: 11/14/2018 CLINICAL DATA:  Shortness of air chest pain EXAM: PORTABLE CHEST 1 VIEW COMPARISON:  October 15, 2016 FINDINGS: Single cardiac pacemaker in stable position. Enlarged cardiac silhouette.  Mediastinal contours appear intact. There is no evidence of focal airspace consolidation, pleural effusion or pneumothorax. Probable mild pulmonary vascular congestion. Osseous structures are without acute abnormality. Soft tissues are  grossly normal. IMPRESSION: Markedly enlarged cardiac silhouette. This may represent cardiomyopathy or pericardial effusion. Probable mild pulmonary vascular congestion. Electronically Signed   By: Fidela Salisbury M.D.   On: 11/14/2018 14:09    My personal review of EKG: Rhythm NSR, Rate  85 /min, QTc 508 , no Acute ST changes   Assessment & Plan:    Active Problems:   HTN (hypertension)   Hyperkalemia   NICM (nonischemic cardiomyopathy) (H[facility])   Abdominal pain  Nausea/vomiting/abdominal pain -No clear etiology could be identified, will obtain CT abdomen and pelvis without contrast for further evaluation. -Lipase  within normal limit  Acute on chronic systolic CHF/ nonischemic cardiomyopathy EF 25 to 30% -Patient appears to be with slight volume overload, will continue with IV Lasix 60 mg twice daily. -Continue with home medication including digoxin, Coreg, hydralazine, Imdur and Colranor. -No Aldactone or Entresto given in the past given hyperkalemia,  Hyperkalemia -Patient received Lokelma, will give Kayexalate, repeat potassium this evening, monitor on telemetry.  Transaminitis -Mildly elevated, most likely hepatic congestion from CHF, follow CT abdomen pelvis, will repeat level in a.m.Marland Kitchen  Prolonged QTC -508, mild, will correct hyperkalemia repeat EKG in a.m., avoid prolonging agents.  Diabetes mellitus type 2 -We will resume Lantus at a lower dose, will add insulin sliding scale during hospital stay  Hypertension -Continue with home medication  Deafness -Tele video sign language interpreter was used   DVT Prophylaxis Heparin -  Lovenox - SCDs  AM Labs Ordered, also please review Full Orders  Family Communication: Admission, patients condition and plan of care including tests being ordered have been discussed with the patient  who indicate understanding and agree with the plan and Code Status.  Code Status Full  Likely DC to Home  Condition GUARDED   Consults  called:  None  Admission status:  Observation  Time spent in minutes : 60 minutes   Phillips Climes M.D on 11/14/2018 at 3:57 PM  Between 7am to 7pm - Pager - [phone]. After 7pm go to [email or web address] - password [email or web address]  [facility] - Office  [phone]

## 2018-11-14 NOTE — ED Notes (Signed)
Report given to Raven, RN

## 2018-11-14 NOTE — ED Notes (Signed)
ED TO INPATIENT HANDOFF REPORT  ED Nurse Name and Phone #:  Vevelyn Royals Name/Age/Gender Troy [place] 48 y.o. male Room/Bed: 037C/037C  Code Status   Code Status: Full Code  Home/SNF/Other Home Patient oriented to: self, place, time and situation Is this baseline? Yes   Triage Complete: Triage complete  Chief Complaint abd pain  Triage Note Onset this morning abd pain, N/V.   EMS gave Zofran 4mg , nausea decreased.  CBG 256 BP 136/58 HR 84 Pt is deaf, can write or sign.      Allergies Allergies  Allergen Reactions  . Lisinopril Cough    Level of Care/Admitting Diagnosis ED Disposition    ED Disposition Condition [facility] Area: [place] [100100]  Level of Care: Telemetry Medical [104]  I expect the patient will be discharged within 24 hours: No (not a candidate for 5C-Observation unit)  Covid Evaluation: Asymptomatic Screening Protocol (No Symptoms)  Diagnosis: Abdominal pain [660630]  Admitting Physician: Manfred Shirts  Attending Physician: Waldron Labs, DAWOOD S [[id number]  PT Class (Do Not Modify): Observation [104]  PT Acc Code (Do Not Modify): Observation [10022]       B Medical/Surgery History Past Medical History:  Diagnosis Date  . AICD (automatic cardioverter/defibrillator) present 10/14/2016  . Bronchitis   . CHF (congestive heart failure) ([place])   . Depression   . Diabetes mellitus without complication ([place])   . Dyslipidemia   . Hypertension   . NICM (nonischemic cardiomyopathy) ([place]) 09/15/2016  . Pancreatitis    Past Surgical History:  Procedure Laterality Date  . BACK SURGERY    . CARDIAC CATHETERIZATION    . ICD IMPLANT  10/14/2016  . ICD IMPLANT N/A 10/14/2016   Procedure: ICD Implant;  Surgeon: Deboraha Sprang, MD;  Location: [facility];  Service: Cardiovascular;  Laterality: N/A;  . WRIST SURGERY       A IV Location/Drains/Wounds Patient Lines/Drains/Airways Status    Active Line/Drains/Airways    Name:   Placement date:   Placement time:   Site:   Days:   Peripheral IV 11/14/18 Right Antecubital   11/14/18    1234    Antecubital   less than 1   Incision (Closed) 10/14/16 Chest Left;Upper   10/14/16    1406     761          Intake/Output Last 24 hours No intake or output data in the 24 hours ending 11/14/18 1944  Labs/Imaging Results for orders placed or performed during the hospital encounter of 11/14/18 (from the past 48 hour(s))  Lipase, blood     Status: None   Collection Time: 11/14/18 12:34 PM  Result Value Ref Range   Lipase 40 11 - 51 U/L    Comment: Performed at [facility], [place] [address] [place], [place] [address]  Comprehensive metabolic panel     Status: Abnormal   Collection Time: 11/14/18 12:34 PM  Result Value Ref Range   Sodium 135 135 - 145 mmol/L   Potassium 6.0 (H) 3.5 - 5.1 mmol/L   Chloride 103 98 - 111 mmol/L   CO2 24 22 - 32 mmol/L   Glucose, Bld 272 (H) 70 - 99 mg/dL   BUN 22 (H) 6 - 20 mg/dL   Creatinine, Ser 1.30 (H) 0.61 - 1.24 mg/dL   Calcium 8.7 (L) 8.9 - 10.3 mg/dL   Total Protein 7.2 6.5 - 8.1 g/dL   Albumin 3.6  3.5 - 5.0 g/dL   AST 60 (H) 15 - 41 U/L   ALT 46 (H) 0 - 44 U/L   Alkaline Phosphatase 54 38 - 126 U/L   Total Bilirubin 1.5 (H) 0.3 - 1.2 mg/dL   GFR calc non Af Amer >60 >60 mL/min   GFR calc Af Amer >60 >60 mL/min   Anion gap 8 5 - 15    Comment: Performed at [facility] [address] [place], [place] [address]  CBC     Status: Abnormal   Collection Time: 11/14/18 12:34 PM  Result Value Ref Range   WBC 7.5 4.0 - 10.5 K/uL   RBC 3.81 (L) 4.22 - 5.81 MIL/uL   Hemoglobin 10.2 (L) 13.0 - 17.0 g/dL   HCT 34.3 (L) 39.0 - 52.0 %   MCV 90.0 80.0 - 100.0 fL   MCH 26.8 26.0 - 34.0 pg   MCHC 29.7 (L) 30.0 - 36.0 g/dL   RDW 13.5 11.5 - 15.5 %   Platelets 200 150 - 400 K/uL   nRBC 1.2 (H) 0.0 - 0.2 %    Comment: Performed at [facility], [employer] [address] [place],  [place] [address]  Brain natriuretic peptide     Status: Abnormal   Collection Time: 11/14/18  1:36 PM  Result Value Ref Range   B Natriuretic Peptide 532.4 (H) 0.0 - 100.0 pg/mL    Comment: Performed at [facility] [address] [place], [place] [address]  Troponin I (High Sensitivity)     Status: None   Collection Time: 11/14/18  1:36 PM  Result Value Ref Range   Troponin I (High Sensitivity) 12 <18 ng/L    Comment: (NOTE) Elevated high sensitivity troponin I (hsTnI) values and significant  changes across serial measurements may suggest ACS but many other  chronic and acute conditions are known to elevate hsTnI results.  Refer to the "Links" section for chest pain algorithms and additional  guidance. Performed at [facility], [place] [address] [place], [place] [address]   Digoxin level     Status: Abnormal   Collection Time: 11/14/18  1:36 PM  Result Value Ref Range   Digoxin Level <0.2 (L) 0.8 - 2.0 ng/mL    Comment: RESULTS CONFIRMED BY MANUAL DILUTION Performed at [facility], [place] [address] [place], [place] [address]   SARS Coronavirus 2 [facility] order, Performed in [facility] hospital lab) Nasopharyngeal Nasopharyngeal Swab     Status: None   Collection Time: 11/14/18  3:37 PM   Specimen: Nasopharyngeal Swab  Result Value Ref Range   SARS Coronavirus 2 NEGATIVE NEGATIVE    Comment: (NOTE) If result is NEGATIVE SARS-CoV-2 target nucleic acids are NOT DETECTED. The SARS-CoV-2 RNA is generally detectable in upper and lower  respiratory specimens during the acute phase of infection. The lowest  concentration of SARS-CoV-2 viral copies this assay can detect is 250  copies / mL. A negative result does not preclude SARS-CoV-2 infection  and should not be used as the sole basis for treatment or other  patient management decisions.  A negative result may occur with  improper specimen collection / handling, submission of specimen other  than nasopharyngeal swab,  presence of viral mutation(s) within the  areas targeted by this assay, and inadequate number of viral copies  (<250 copies / mL). A negative result must be combined with clinical  observations, patient history, and epidemiological information. If result is POSITIVE  SARS-CoV-2 target nucleic acids are DETECTED. The SARS-CoV-2 RNA is generally detectable in upper and lower  respiratory specimens dur ing the acute phase of infection.  Positive  results are indicative of active infection with SARS-CoV-2.  Clinical  correlation with patient history and other diagnostic information is  necessary to determine patient infection status.  Positive results do  not rule out bacterial infection or co-infection with other viruses. If result is PRESUMPTIVE POSTIVE SARS-CoV-2 nucleic acids MAY BE PRESENT.   A presumptive positive result was obtained on the submitted specimen  and confirmed on repeat testing.  While 2019 novel coronavirus  (SARS-CoV-2) nucleic acids may be present in the submitted sample  additional confirmatory testing may be necessary for epidemiological  and / or clinical management purposes  to differentiate between  SARS-CoV-2 and other Sarbecovirus currently known to infect humans.  If clinically indicated additional testing with an alternate test  methodology [phone]) is advised. The SARS-CoV-2 RNA is generally  detectable in upper and lower respiratory sp ecimens during the acute  phase of infection. The expected result is Negative. Fact Sheet for Patients:  StrictlyIdeas.no Fact Sheet for Healthcare Providers: BankingDealers.co.za This test is not yet approved or cleared by the [place] FDA and has been authorized for detection and/or diagnosis of SARS-CoV-2 by FDA under an Emergency Use Authorization (EUA).  This EUA will remain in effect (meaning this test can be used) for the duration of the COVID-19 declaration under  Section 564(b)(1) of the Act, 21 U.S.C. section 360bbb-3(b)(1), unless the authorization is terminated or revoked sooner. Performed at [facility], [place] [address] [place], [place] [address]   Troponin I (High Sensitivity)     Status: None   Collection Time: 11/14/18  3:46 PM  Result Value Ref Range   Troponin I (High Sensitivity) 14 <18 ng/L    Comment: (NOTE) Elevated high sensitivity troponin I (hsTnI) values and significant  changes across serial measurements may suggest ACS but many other  chronic and acute conditions are known to elevate hsTnI results.  Refer to the "Links" section for chest pain algorithms and additional  guidance. Performed at [facility], [place] [address], [place], [place] [address]   CBG monitoring, ED     Status: Abnormal   Collection Time: 11/14/18  4:55 PM  Result Value Ref Range   Glucose-Capillary 234 (H) 70 - 99 mg/dL  Urinalysis, Routine w reflex microscopic     Status: Abnormal   Collection Time: 11/14/18  5:38 PM  Result Value Ref Range   Color, Urine YELLOW YELLOW   APPearance CLEAR CLEAR   Specific Gravity, Urine 1.014 1.005 - 1.030   pH 5.0 5.0 - 8.0   Glucose, UA 50 (A) NEGATIVE mg/dL   Hgb urine dipstick NEGATIVE NEGATIVE   Bilirubin Urine NEGATIVE NEGATIVE   Ketones, ur NEGATIVE NEGATIVE mg/dL   Protein, ur >=300 (A) NEGATIVE mg/dL   Nitrite NEGATIVE NEGATIVE   Leukocytes,Ua NEGATIVE NEGATIVE   RBC / HPF 0-5 0 - 5 RBC/hpf   WBC, UA 0-5 0 - 5 WBC/hpf   Bacteria, UA NONE SEEN NONE SEEN   Hyaline Casts, UA PRESENT     Comment: Performed at [facility], [address] [place], [place] [address]  CBG monitoring, ED     Status: Abnormal   Collection Time: 11/14/18  7:07 PM  Result Value Ref Range   Glucose-Capillary 162 (H) 70 - 99 mg/dL   Ct Abdomen Pelvis Wo Contrast  Result Date: 11/14/2018 CLINICAL DATA:  Abdominal pain nausea and vomiting shortness of breath EXAM: CT CHEST, ABDOMEN AND PELVIS WITHOUT CONTRAST  TECHNIQUE: Multidetector CT imaging of the chest, abdomen and pelvis was performed following the standard protocol without IV contrast. COMPARISON:  Chest x-ray 11/14/2018 FINDINGS: CT CHEST FINDINGS Cardiovascular: Limited evaluation without intravenous contrast. Nonaneurysmal aorta. Left-sided cardiac pacing device with lead in the right ventricle. Cardiomegaly. No significant pericardial effusion. Mediastinum/Nodes: No enlarged mediastinal, hilar, or axillary lymph nodes. Thyroid gland, trachea, and esophagus demonstrate no significant findings. Lungs/Pleura: Lungs are clear. No pleural effusion or pneumothorax. Musculoskeletal: Small amount of gynecomastia. No acute or suspicious osseous abnormality CT ABDOMEN PELVIS FINDINGS Hepatobiliary: No focal hepatic abnormality. Possible subtle increased density in the gallbladder. No biliary dilatation Pancreas: Unremarkable. No pancreatic ductal dilatation or surrounding inflammatory changes. Spleen: Normal in size without focal abnormality. Adrenals/Urinary Tract: Adrenal glands are unremarkable. Kidneys are normal, without renal calculi, focal lesion, or hydronephrosis. Bladder is unremarkable. Stomach/Bowel: Stomach is within normal limits. Appendix appears normal. No evidence of bowel wall thickening, distention, or inflammatory changes. Small amount of radiopaque material within the small bowel and colon. Vascular/Lymphatic: Mild aortic atherosclerosis. No enlarged abdominal or pelvic lymph nodes. Reproductive: Prostate is unremarkable. Other: Negative for free air or free fluid. Small fat in the umbilical region. Musculoskeletal: No acute or significant osseous findings. IMPRESSION: 1. No CT evidence for acute intrathoracic, intra-abdominal or intrapelvic abnormality 2. Cardiomegaly 3. Possible gallbladder sludge or stones Electronically Signed   By: Donavan Foil M.D.   On: 11/14/2018 18:21   Ct Chest Wo Contrast  Result Date: 11/14/2018 CLINICAL DATA:   Abdominal pain nausea and vomiting shortness of breath EXAM: CT CHEST, ABDOMEN AND PELVIS WITHOUT CONTRAST TECHNIQUE: Multidetector CT imaging of the chest, abdomen and pelvis was performed following the standard protocol without IV contrast. COMPARISON:  Chest x-ray 11/14/2018 FINDINGS: CT CHEST FINDINGS Cardiovascular: Limited evaluation without intravenous contrast. Nonaneurysmal aorta. Left-sided cardiac pacing device with lead in the right ventricle. Cardiomegaly. No significant pericardial effusion. Mediastinum/Nodes: No enlarged mediastinal, hilar, or axillary lymph nodes. Thyroid gland, trachea, and esophagus demonstrate no significant findings. Lungs/Pleura: Lungs are clear. No pleural effusion or pneumothorax. Musculoskeletal: Small amount of gynecomastia. No acute or suspicious osseous abnormality CT ABDOMEN PELVIS FINDINGS Hepatobiliary: No focal hepatic abnormality. Possible subtle increased density in the gallbladder. No biliary dilatation Pancreas: Unremarkable. No pancreatic ductal dilatation or surrounding inflammatory changes. Spleen: Normal in size without focal abnormality. Adrenals/Urinary Tract: Adrenal glands are unremarkable. Kidneys are normal, without renal calculi, focal lesion, or hydronephrosis. Bladder is unremarkable. Stomach/Bowel: Stomach is within normal limits. Appendix appears normal. No evidence of bowel wall thickening, distention, or inflammatory changes. Small amount of radiopaque material within the small bowel and colon. Vascular/Lymphatic: Mild aortic atherosclerosis. No enlarged abdominal or pelvic lymph nodes. Reproductive: Prostate is unremarkable. Other: Negative for free air or free fluid. Small fat in the umbilical region. Musculoskeletal: No acute or significant osseous findings. IMPRESSION: 1. No CT evidence for acute intrathoracic, intra-abdominal or intrapelvic abnormality 2. Cardiomegaly 3. Possible gallbladder sludge or stones Electronically Signed   By: Donavan Foil M.D.   On: 11/14/2018 18:21   Dg Chest Portable 1 View  Result Date: 11/14/2018 CLINICAL DATA:  Shortness of air chest pain EXAM: PORTABLE CHEST 1 VIEW COMPARISON:  October 15, 2016 FINDINGS: Single cardiac pacemaker in stable position. Enlarged cardiac silhouette.  Mediastinal contours appear intact. There is no evidence of focal airspace consolidation, pleural effusion or pneumothorax. Probable mild  pulmonary vascular congestion. Osseous structures are without acute abnormality. Soft tissues are grossly normal. IMPRESSION: Markedly enlarged cardiac silhouette. This may represent cardiomyopathy or pericardial effusion. Probable mild pulmonary vascular congestion. Electronically Signed   By: Fidela Salisbury M.D.   On: 11/14/2018 14:09    Pending Labs Unresulted Labs (From admission, onward)    Start     Ordered   11/15/18 0500  HIV antibody (Routine Testing)  Tomorrow morning,   R     11/14/18 1835   11/15/18 0500  Comprehensive metabolic panel  Tomorrow morning,   R     11/14/18 1835   11/15/18 0500  CBC  Tomorrow morning,   R     11/14/18 1835   11/14/18 2030  Potassium  Once,   STAT     11/14/18 1644   11/14/18 1836  Creatinine, serum  (heparin)  Once,   STAT    Comments: Baseline for heparin therapy IF NOT ALREADY DRAWN.    11/14/18 1835          Vitals/Pain Today's Vitals   11/14/18 1730 11/14/18 1800 11/14/18 1830 11/14/18 1915  BP: (!) 158/103 (!) 159/104 (!) 147/95 (!) 143/89  Pulse:    97  Resp:      Temp:      TempSrc:      SpO2:        Isolation Precautions No active isolations  Medications Medications  carvedilol (COREG) tablet 18.75 mg (has no administration in time range)  hydrALAZINE (APRESOLINE) tablet 37.5 mg (has no administration in time range)  ivabradine (CORLANOR) tablet 5 mg (has no administration in time range)  Insulin Glargine (LANTUS) Solostar Pen 25 Units (has no administration in time range)  heparin injection 5,000 Units (has no  administration in time range)  furosemide (LASIX) injection 60 mg (has no administration in time range)  insulin aspart (novoLOG) injection 0-5 Units (0 Units Subcutaneous Hold 11/14/18 2200)  insulin aspart (novoLOG) injection 0-15 Units (5 Units Subcutaneous Given 11/14/18 0500)  aspirin chewable tablet 81 mg (has no administration in time range)  aspirin chewable tablet 81 mg (has no administration in time range)  atorvastatin (LIPITOR) tablet 80 mg (has no administration in time range)  atorvastatin (LIPITOR) tablet 80 mg (has no administration in time range)  citalopram (CELEXA) tablet 40 mg (has no administration in time range)  digoxin (LANOXIN) tablet 0.125 mg (has no administration in time range)  isosorbide mononitrate (IMDUR) 24 hr tablet 30 mg (has no administration in time range)  citalopram (CELEXA) tablet 40 mg (has no administration in time range)  isosorbide mononitrate (IMDUR) 24 hr tablet 30 mg (has no administration in time range)  digoxin (LANOXIN) tablet 0.125 mg (has no administration in time range)  sodium chloride flush (NS) 0.9 % injection 3 mL (3 mLs Intravenous Given 11/14/18 1337)  prochlorperazine (COMPAZINE) injection 10 mg (10 mg Intravenous Given 11/14/18 1324)  fentaNYL (SUBLIMAZE) injection 50 mcg (50 mcg Intravenous Given 11/14/18 1327)  sodium zirconium cyclosilicate (LOKELMA) packet 10 g (10 g Oral Given 11/14/18 1541)  furosemide (LASIX) injection 60 mg (60 mg Intravenous Given 11/14/18 1540)  sodium polystyrene (KAYEXALATE) 15 GM/60ML suspension 30 g (30 g Oral Given 11/14/18 1701)    Mobility walks Low fall risk   Focused Assessments Abdominal assessment  R Recommendations: See Admitting Provider Note  Report given to:   Additional Notes: Pt is deaf

## 2018-11-14 NOTE — ED Notes (Signed)
Spoke with patient using the iPad sign language.

## 2018-11-15 ENCOUNTER — Ambulatory Visit: Payer: Medicare Other | Admitting: Family Medicine

## 2018-11-15 DIAGNOSIS — Z20828 Contact with and (suspected) exposure to other viral communicable diseases: Secondary | ICD-10-CM | POA: Diagnosis not present

## 2018-11-15 DIAGNOSIS — E875 Hyperkalemia: Secondary | ICD-10-CM | POA: Diagnosis not present

## 2018-11-15 DIAGNOSIS — I10 Essential (primary) hypertension: Secondary | ICD-10-CM | POA: Diagnosis not present

## 2018-11-15 DIAGNOSIS — Z95810 Presence of automatic (implantable) cardiac defibrillator: Secondary | ICD-10-CM | POA: Diagnosis not present

## 2018-11-15 DIAGNOSIS — R1013 Epigastric pain: Secondary | ICD-10-CM | POA: Diagnosis not present

## 2018-11-15 DIAGNOSIS — R740 Nonspecific elevation of levels of transaminase and lactic acid dehydrogenase [LDH]: Secondary | ICD-10-CM | POA: Diagnosis not present

## 2018-11-15 DIAGNOSIS — I5023 Acute on chronic systolic (congestive) heart failure: Secondary | ICD-10-CM

## 2018-11-15 DIAGNOSIS — Z79899 Other long term (current) drug therapy: Secondary | ICD-10-CM | POA: Diagnosis not present

## 2018-11-15 DIAGNOSIS — H9190 Unspecified hearing loss, unspecified ear: Secondary | ICD-10-CM | POA: Diagnosis not present

## 2018-11-15 DIAGNOSIS — I428 Other cardiomyopathies: Secondary | ICD-10-CM

## 2018-11-15 DIAGNOSIS — I110 Hypertensive heart disease with heart failure: Secondary | ICD-10-CM | POA: Diagnosis not present

## 2018-11-15 DIAGNOSIS — R9431 Abnormal electrocardiogram [ECG] [EKG]: Secondary | ICD-10-CM | POA: Diagnosis not present

## 2018-11-15 DIAGNOSIS — E119 Type 2 diabetes mellitus without complications: Secondary | ICD-10-CM | POA: Diagnosis not present

## 2018-11-15 LAB — GLUCOSE, CAPILLARY
Glucose-Capillary: 85 mg/dL (ref 70–99)
Glucose-Capillary: 241 mg/dL — ABNORMAL HIGH (ref 70–99)

## 2018-11-15 LAB — COMPREHENSIVE METABOLIC PANEL
ALT: 36 U/L (ref 0–44)
AST: 33 U/L (ref 15–41)
Albumin: 3.4 g/dL — ABNORMAL LOW (ref 3.5–5.0)
Alkaline Phosphatase: 53 U/L (ref 38–126)
Anion gap: 16 — ABNORMAL HIGH (ref 5–15)
BUN: 26 mg/dL — ABNORMAL HIGH (ref 6–20)
CO2: 26 mmol/L (ref 22–32)
Calcium: 8.6 mg/dL — ABNORMAL LOW (ref 8.9–10.3)
Chloride: 100 mmol/L (ref 98–111)
Creatinine, Ser: 1.69 mg/dL — ABNORMAL HIGH (ref 0.61–1.24)
GFR calc Af Amer: 54 mL/min — ABNORMAL LOW (ref >60)
GFR calc non Af Amer: 47 mL/min — ABNORMAL LOW (ref >60)
Glucose, Bld: 87 mg/dL (ref 70–99)
Potassium: 3.7 mmol/L (ref 3.5–5.1)
Sodium: 142 mmol/L (ref 135–145)
Total Bilirubin: 0.9 mg/dL (ref 0.3–1.2)
Total Protein: 6.8 g/dL (ref 6.5–8.1)

## 2018-11-15 LAB — CBC
HCT: 32.2 % — ABNORMAL LOW (ref 39.0–52.0)
Hemoglobin: 9.7 g/dL — ABNORMAL LOW (ref 13.0–17.0)
MCH: 27.1 pg (ref 26.0–34.0)
MCHC: 30.1 g/dL (ref 30.0–36.0)
MCV: 89.9 fL (ref 80.0–100.0)
Platelets: 241 10*3/uL (ref 150–400)
RBC: 3.58 MIL/uL — ABNORMAL LOW (ref 4.22–5.81)
RDW: 14.1 % (ref 11.5–15.5)
WBC: 6.5 10*3/uL (ref 4.0–10.5)
nRBC: 0.3 % — ABNORMAL HIGH (ref 0.0–0.2)

## 2018-11-15 LAB — HIV ANTIBODY (ROUTINE TESTING W REFLEX): HIV Screen 4th Generation wRfx: NONREACTIVE

## 2018-11-15 MED ORDER — CITALOPRAM HYDROBROMIDE 40 MG PO TABS: 40.0000 mg | ORAL_TABLET | Freq: Every day | ORAL | 0 refills | Status: DC

## 2018-11-15 NOTE — Progress Notes (Signed)
Pt ate most of his lunch. Pt states that did not feel abdomen discomfort, no nausea or vomit after eating.  Used the [employer] sign language to communicate with pt.

## 2018-11-15 NOTE — Discharge Summary (Signed)
Physician Discharge Summary  Troy Rush IDP:[id number] DOB: May 28, 1970 DOA: 11/14/2018  PCP: Lanae Boast, FNP  Admit date: 11/14/2018 Discharge date: 11/15/2018  Recommendations for Outpatient Follow-up:  1. Follow up with PCP in 7-10 days  Follow-up Information    Lanae Boast, [place]. Schedule an appointment as soon as possible for a visit on 11/22/2018.   Specialty: Family Medicine Why: For Hospital Follow-up @ 2:30pm Contact information: [place] [address] [phone]            Discharge Diagnoses: Principal diagnosis is #1 1. Acute on chronic systolic heart failure and non-ischemic cardiomyopathy EF 25-35% 2. Hyperkalemia 3. Transaminitis 4. Prolonged QTc 5. DM II 6.  Volume overload 7. Hypertension 8. Deafness  Discharge Condition: Fair Disposition: Home  Diet recommendation: Heart healthy diet.  Filed Weights   11/14/18 2106 11/15/18 0655  Weight: 108.5 kg 107.9 kg    History of present illness:  Troy Rush  is a 48 y.o. male, with deafness, history of hypertension, diabetes mellitus, chronic systolic CHF, EF 25 to 09%, has AICD, patient follows with CHF clinic, apparently was not taking his meds for a while, was recently resumed on his meds by CHF clinic eluding digoxin, Coreg Dralzine, Imdur and Ch[place]or, on [place] secondary to recurrent hyperkalemia.  Patient presents to ED secondary to complaints of abdominal pain, emesis and shortness of breath, reports abdominal pain for last 24 hours, mainly epigastric, reports some nausea, denies any coffee-ground emesis, no diarrhea or constipation, reports dyspnea over the last 2 days, mainly orthopnea, denies any chest pain, fever, chills, denies any headache.  In ED patient was found to have equally on imaging, with some vascular congestion, elevated BNP at 532, calcium of 6, and creatinine of 1.3.  Patient is deaf, tele-video interpreter/sign language was used  Hospital Course:    The patient was admitted to a telemetry bed. He was diuresed. His LFT's returned to normal. He denied any abdominal pain, shortness of breath or cough. He will be discharged to home in fair condition.  Today's assessment: S: The patient is awake, alert, and oriented x 3. No acute distress. O: Vitals:  Vitals:   11/15/18 1025 11/15/18 1123  BP: (!) 141/103 (!) 135/91  Pulse: 92 83  Resp:  20  Temp:  98.4 F (36.9 C)  SpO2:  94%    Constitutional:   The patient is awake, alert, and oriented x 3. No acute distress. Respiratory:   CTA bilaterally, no w/r/r.   Respiratory effort normal. No retractions or accessory muscle use Cardiovascular:   RRR, no m/r/g  No LE extremity edema    Normal pedal pulses Abdomen:   Abdomen appears normal; no tenderness or masses  No hernias  No HSM Musculoskeletal:   No cyanosis, clubbing, or edema Skin:   No rashes, lesions, ulcers  palpation of skin: no induration or nodules Neurologic:   CN 2-12 intact  Sensation all 4 extremities intact Psychiatric:   judgement and insight appear normal  Mental status o Mood, affect appropriate o Orientation to person, place, time   Discharge Instructions  Discharge Instructions    (HEART FAILURE PATIENTS) Call MD:  Anytime you have any of the following symptoms: 1) 3 pound weight gain in 24 hours or 5 pounds in 1 week 2) shortness of breath, with or without a dry hacking cough 3) swelling in the hands, feet or stomach 4) if you have to sleep on extra pillows at night in order  to breathe.   Complete by: As directed    Call MD for:  difficulty breathing, headache or visual disturbances   Complete by: As directed    Diet - low sodium heart healthy   Complete by: As directed    Diet Carb Modified   Complete by: As directed    Discharge instructions   Complete by: As directed    Call doctor for weight gain of 3 lbs or more in one day or 5 lbs or more in one week.   Heart Failure  patients record your daily weight using the same scale at the same time of day   Complete by: As directed    Increase activity slowly   Complete by: As directed      Allergies as of 11/15/2018      Reactions   Lisinopril Cough      Medication List    TAKE these medications   Accu-Chek Aviva Plus w/Device Kit 1 each by Does not apply route 4 (four) times daily -  before meals and at bedtime. E.11.8   accu-chek softclix lancets Use as instructed   Accu-Chek Softclix Lancets lancets USE FOUR TIMES DAILY AS DIRECTED   aspirin 81 MG chewable tablet Chew 81 mg by mouth daily.   atorvastatin 80 MG tablet Commonly known as: LIPITOR Take 1 tablet (80 mg total) by mouth daily.   blood glucose meter kit and supplies Kit Dispense based on patient and insurance preference. Use up to four times daily as directed. (FOR ICD-9 250.00, 250.01).   canagliflozin 300 MG Tabs tablet Commonly known as: Invokana Take 1 tablet (300 mg total) by mouth daily before breakfast.   carvedilol 12.5 MG tablet Commonly known as: COREG Take 1.5 tablets (18.75 mg total) by mouth 2 (two) times daily.   citalopram 40 MG tablet Commonly known as: CELEXA Take 1 tablet (40 mg total) by mouth daily. Start taking on: November 16, 2018   digoxin 0.125 MG tablet Commonly known as: LANOXIN Take 1 tablet (0.125 mg total) by mouth daily.   furosemide 80 MG tablet Commonly known as: LASIX Take 1 tablet (80 mg total) by mouth daily.   glucose blood test strip Commonly known as: Accu-Chek Aviva Plus Check blood three times per day and at bedtime   hydrALAZINE 25 MG tablet Commonly known as: APRESOLINE Take 1.5 tablets (37.5 mg total) by mouth 3 (three) times daily.   Insulin Glargine 100 UNIT/ML Solostar Pen Commonly known as: Lantus SoloStar Inject 40 Units into the skin daily at 10 pm.   isosorbide mononitrate 30 MG 24 hr tablet Commonly known as: IMDUR Take 1 tablet (30 mg total) by mouth daily.     ivabradine 5 MG Tabs tablet Commonly known as: Corlanor Take 1 tablet (5 mg total) by mouth 2 (two) times daily with a meal.   metFORMIN 1000 MG tablet Commonly known as: GLUCOPHAGE Take 1 tablet (1,000 mg total) by mouth 2 (two) times daily with a meal.   promethazine 25 MG tablet Commonly known as: PHENERGAN Take 1 tablet (25 mg total) by mouth every 6 (six) hours as needed for nausea or vomiting.      Allergies  Allergen Reactions   Lisinopril Cough    The results of significant diagnostics from this hospitalization (including imaging, microbiology, ancillary and laboratory) are listed below for reference.    Significant Diagnostic Studies: Ct Abdomen Pelvis Wo Contrast  Result Date: 11/14/2018 CLINICAL DATA:  Abdominal pain nausea and vomiting shortness  of breath EXAM: CT CHEST, ABDOMEN AND PELVIS WITHOUT CONTRAST TECHNIQUE: Multidetector CT imaging of the chest, abdomen and pelvis was performed following the standard protocol without IV contrast. COMPARISON:  Chest x-ray 11/14/2018 FINDINGS: CT CHEST FINDINGS Cardiovascular: Limited evaluation without intravenous contrast. Nonaneurysmal aorta. Left-sided cardiac pacing device with lead in the right ventricle. Cardiomegaly. No significant pericardial effusion. Mediastinum/Nodes: No enlarged mediastinal, hilar, or axillary lymph nodes. Thyroid gland, trachea, and esophagus demonstrate no significant findings. Lungs/Pleura: Lungs are clear. No pleural effusion or pneumothorax. Musculoskeletal: Small amount of gynecomastia. No acute or suspicious osseous abnormality CT ABDOMEN PELVIS FINDINGS Hepatobiliary: No focal hepatic abnormality. Possible subtle increased density in the gallbladder. No biliary dilatation Pancreas: Unremarkable. No pancreatic ductal dilatation or surrounding inflammatory changes. Spleen: Normal in size without focal abnormality. Adrenals/Urinary Tract: Adrenal glands are unremarkable. Kidneys are normal, without  renal calculi, focal lesion, or hydronephrosis. Bladder is unremarkable. Stomach/Bowel: Stomach is within normal limits. Appendix appears normal. No evidence of bowel wall thickening, distention, or inflammatory changes. Small amount of radiopaque material within the small bowel and colon. Vascular/Lymphatic: Mild aortic atherosclerosis. No enlarged abdominal or pelvic lymph nodes. Reproductive: Prostate is unremarkable. Other: Negative for free air or free fluid. Small fat in the umbilical region. Musculoskeletal: No acute or significant osseous findings. IMPRESSION: 1. No CT evidence for acute intrathoracic, intra-abdominal or intrapelvic abnormality 2. Cardiomegaly 3. Possible gallbladder sludge or stones Electronically Signed   By: Donavan Foil M.D.   On: 11/14/2018 18:21   Ct Chest Wo Contrast  Result Date: 11/14/2018 CLINICAL DATA:  Abdominal pain nausea and vomiting shortness of breath EXAM: CT CHEST, ABDOMEN AND PELVIS WITHOUT CONTRAST TECHNIQUE: Multidetector CT imaging of the chest, abdomen and pelvis was performed following the standard protocol without IV contrast. COMPARISON:  Chest x-ray 11/14/2018 FINDINGS: CT CHEST FINDINGS Cardiovascular: Limited evaluation without intravenous contrast. Nonaneurysmal aorta. Left-sided cardiac pacing device with lead in the right ventricle. Cardiomegaly. No significant pericardial effusion. Mediastinum/Nodes: No enlarged mediastinal, hilar, or axillary lymph nodes. Thyroid gland, trachea, and esophagus demonstrate no significant findings. Lungs/Pleura: Lungs are clear. No pleural effusion or pneumothorax. Musculoskeletal: Small amount of gynecomastia. No acute or suspicious osseous abnormality CT ABDOMEN PELVIS FINDINGS Hepatobiliary: No focal hepatic abnormality. Possible subtle increased density in the gallbladder. No biliary dilatation Pancreas: Unremarkable. No pancreatic ductal dilatation or surrounding inflammatory changes. Spleen: Normal in size without  focal abnormality. Adrenals/Urinary Tract: Adrenal glands are unremarkable. Kidneys are normal, without renal calculi, focal lesion, or hydronephrosis. Bladder is unremarkable. Stomach/Bowel: Stomach is within normal limits. Appendix appears normal. No evidence of bowel wall thickening, distention, or inflammatory changes. Small amount of radiopaque material within the small bowel and colon. Vascular/Lymphatic: Mild aortic atherosclerosis. No enlarged abdominal or pelvic lymph nodes. Reproductive: Prostate is unremarkable. Other: Negative for free air or free fluid. Small fat in the umbilical region. Musculoskeletal: No acute or significant osseous findings. IMPRESSION: 1. No CT evidence for acute intrathoracic, intra-abdominal or intrapelvic abnormality 2. Cardiomegaly 3. Possible gallbladder sludge or stones Electronically Signed   By: Donavan Foil M.D.   On: 11/14/2018 18:21   Dg Chest Portable 1 View  Result Date: 11/14/2018 CLINICAL DATA:  Shortness of air chest pain EXAM: PORTABLE CHEST 1 VIEW COMPARISON:  October 15, 2016 FINDINGS: Single cardiac pacemaker in stable position. Enlarged cardiac silhouette.  Mediastinal contours appear intact. There is no evidence of focal airspace consolidation, pleural effusion or pneumothorax. Probable mild pulmonary vascular congestion. Osseous structures are without acute abnormality. Soft tissues are grossly  normal. IMPRESSION: Markedly enlarged cardiac silhouette. This may represent cardiomyopathy or pericardial effusion. Probable mild pulmonary vascular congestion. Electronically Signed   By: Fidela Salisbury M.D.   On: 11/14/2018 14:09    Microbiology: Recent Results (from the past 240 hour(s))  SARS Coronavirus 2 [facility] order, Performed in [facility] hospital lab) Nasopharyngeal Nasopharyngeal Swab     Status: None   Collection Time: 11/14/18  3:37 PM   Specimen: Nasopharyngeal Swab  Result Value Ref Range Status   SARS Coronavirus 2 NEGATIVE NEGATIVE  Final    Comment: (NOTE) If result is NEGATIVE SARS-CoV-2 target nucleic acids are NOT DETECTED. The SARS-CoV-2 RNA is generally detectable in upper and lower  respiratory specimens during the acute phase of infection. The lowest  concentration of SARS-CoV-2 viral copies this assay can detect is 250  copies / mL. A negative result does not preclude SARS-CoV-2 infection  and should not be used as the sole basis for treatment or other  patient management decisions.  A negative result may occur with  improper specimen collection / handling, submission of specimen other  than nasopharyngeal swab, presence of viral mutation(s) within the  areas targeted by this assay, and inadequate number of viral copies  (<250 copies / mL). A negative result must be combined with clinical  observations, patient history, and epidemiological information. If result is POSITIVE SARS-CoV-2 target nucleic acids are DETECTED. The SARS-CoV-2 RNA is generally detectable in upper and lower  respiratory specimens dur ing the acute phase of infection.  Positive  results are indicative of active infection with SARS-CoV-2.  Clinical  correlation with patient history and other diagnostic information is  necessary to determine patient infection status.  Positive results do  not rule out bacterial infection or co-infection with other viruses. If result is PRESUMPTIVE POSTIVE SARS-CoV-2 nucleic acids MAY BE PRESENT.   A presumptive positive result was obtained on the submitted specimen  and confirmed on repeat testing.  While 2019 novel coronavirus  (SARS-CoV-2) nucleic acids may be present in the submitted sample  additional confirmatory testing may be necessary for epidemiological  and / or clinical management purposes  to differentiate between  SARS-CoV-2 and other Sarbecovirus currently known to infect humans.  If clinically indicated additional testing with an alternate test  methodology [phone]) is advised. The  SARS-CoV-2 RNA is generally  detectable in upper and lower respiratory sp ecimens during the acute  phase of infection. The expected result is Negative. Fact Sheet for Patients:  StrictlyIdeas.no Fact Sheet for Healthcare Providers: BankingDealers.co.za This test is not yet approved or cleared by the [place] FDA and has been authorized for detection and/or diagnosis of SARS-CoV-2 by FDA under an Emergency Use Authorization (EUA).  This EUA will remain in effect (meaning this test can be used) for the duration of the COVID-19 declaration under Section 564(b)(1) of the Act, 21 U.S.C. section 360bbb-3(b)(1), unless the authorization is terminated or revoked sooner. Performed at [facility], [place] [address], [place], [place] [address]      Labs: Basic Metabolic Panel: Recent Labs  Lab 11/14/18 1234 11/14/18 1943 11/14/18 2155 11/15/18 0423  NA 135  --   --  142  K 6.0*  --  4.2 3.7  CL 103  --   --  100  CO2 24  --   --  26  GLUCOSE 272*  --   --  87  BUN 22*  --   --  26*  CREATININE 1.30* 1.46*  --  1.69*  CALCIUM 8.7*  --   --  8.6*   Liver Function Tests: Recent Labs  Lab 11/14/18 1234 11/15/18 0423  AST 60* 33  ALT 46* 36  ALKPHOS 54 53  BILITOT 1.5* 0.9  PROT 7.2 6.8  ALBUMIN 3.6 3.4*   Recent Labs  Lab 11/14/18 1234  LIPASE 40   No results for input(s): AMMONIA in the last 168 hours. CBC: Recent Labs  Lab 11/14/18 1234 11/15/18 0423  WBC 7.5 6.5  HGB 10.2* 9.7*  HCT 34.3* 32.2*  MCV 90.0 89.9  PLT 200 241   Cardiac Enzymes: No results for input(s): CKTOTAL, CKMB, CKMBINDEX, TROPONINI in the last 168 hours. BNP: BNP (last 3 results) Recent Labs    11/14/18 1336  BNP 532.4*    ProBNP (last 3 results) No results for input(s): PROBNP in the last 8760 hours.  CBG: Recent Labs  Lab 11/14/18 1655 11/14/18 1907 11/14/18 2154 11/15/18 0626 11/15/18 1311  GLUCAP 234* 162* 119* 85  241*    Active Problems:   HTN (hypertension)   Hyperkalemia   NICM (nonischemic cardiomyopathy) (HCC)   Abdominal pain   Time coordinating discharge: 38 minutes.  Signed:        Ivyana Locey, DO [facility]  11/15/2018, 6:01 PM

## 2018-11-15 NOTE — Progress Notes (Signed)
Discharge and medication education with teach back. Questions and concerns were answered. Peripheral iv removed, clean dry and intact.  Used the [employer] sign language "frog" in pt's room to communicate with pt.  Called taxi and have pt a taxi voucher. Pt's belongings with pt: clothes, shoes, cell phone and wallet.

## 2018-11-22 ENCOUNTER — Ambulatory Visit: Payer: Medicare Other | Admitting: Family Medicine

## 2018-12-05 ENCOUNTER — Encounter: Payer: Medicare Other | Admitting: *Deleted

## 2018-12-09 ENCOUNTER — Encounter: Payer: Self-pay | Admitting: Surgery

## 2018-12-19 ENCOUNTER — Encounter (HOSPITAL_COMMUNITY): Payer: Self-pay | Admitting: *Deleted

## 2018-12-19 ENCOUNTER — Encounter (HOSPITAL_COMMUNITY): Payer: Self-pay

## 2018-12-24 ENCOUNTER — Ambulatory Visit (INDEPENDENT_AMBULATORY_CARE_PROVIDER_SITE_OTHER): Payer: Medicare Other | Admitting: *Deleted

## 2018-12-24 DIAGNOSIS — I428 Other cardiomyopathies: Secondary | ICD-10-CM

## 2018-12-24 DIAGNOSIS — I5022 Chronic systolic (congestive) heart failure: Secondary | ICD-10-CM

## 2018-12-25 LAB — CUP PACEART REMOTE DEVICE CHECK
Date Time Interrogation Session: 20200915084208
Implantable Lead Implant Date: 20180706
Implantable Lead Location: 753860
Implantable Lead Model: 293
Implantable Lead Serial Number: 432676
Implantable Pulse Generator Implant Date: 20180706
Pulse Gen Serial Number: 236570

## 2019-01-04 ENCOUNTER — Other Ambulatory Visit: Payer: Self-pay

## 2019-01-04 ENCOUNTER — Encounter (HOSPITAL_COMMUNITY): Payer: Self-pay | Admitting: Cardiology

## 2019-01-04 ENCOUNTER — Ambulatory Visit (HOSPITAL_COMMUNITY)
Admission: RE | Admit: 2019-01-04 | Discharge: 2019-01-04 | Disposition: A | Discharge status: Home / Self Care | Payer: Medicare Other | Source: Ambulatory Visit | Attending: Adult Health | Admitting: Adult Health

## 2019-01-04 ENCOUNTER — Ambulatory Visit (HOSPITAL_BASED_OUTPATIENT_CLINIC_OR_DEPARTMENT_OTHER)
Admission: RE | Admit: 2019-01-04 | Discharge: 2019-01-04 | Disposition: A | Discharge status: Home / Self Care | Payer: Medicare Other | Source: Ambulatory Visit | Attending: Cardiology | Admitting: Cardiology

## 2019-01-04 VITALS — BP 152/80 | HR 84 | Wt 236.6 lb

## 2019-01-04 DIAGNOSIS — E1122 Type 2 diabetes mellitus with diabetic chronic kidney disease: Secondary | ICD-10-CM | POA: Diagnosis not present

## 2019-01-04 DIAGNOSIS — I5022 Chronic systolic (congestive) heart failure: Secondary | ICD-10-CM | POA: Diagnosis not present

## 2019-01-04 DIAGNOSIS — F329 Major depressive disorder, single episode, unspecified: Secondary | ICD-10-CM | POA: Insufficient documentation

## 2019-01-04 DIAGNOSIS — I77819 Aortic ectasia, unspecified site: Secondary | ICD-10-CM | POA: Diagnosis not present

## 2019-01-04 DIAGNOSIS — I340 Nonrheumatic mitral (valve) insufficiency: Secondary | ICD-10-CM | POA: Insufficient documentation

## 2019-01-04 DIAGNOSIS — Z95810 Presence of automatic (implantable) cardiac defibrillator: Secondary | ICD-10-CM | POA: Diagnosis not present

## 2019-01-04 DIAGNOSIS — I428 Other cardiomyopathies: Secondary | ICD-10-CM

## 2019-01-04 DIAGNOSIS — I130 Hypertensive heart and chronic kidney disease with heart failure and stage 1 through stage 4 chronic kidney disease, or unspecified chronic kidney disease: Secondary | ICD-10-CM | POA: Insufficient documentation

## 2019-01-04 DIAGNOSIS — N183 Chronic kidney disease, stage 3 (moderate): Secondary | ICD-10-CM | POA: Diagnosis not present

## 2019-01-04 DIAGNOSIS — E785 Hyperlipidemia, unspecified: Secondary | ICD-10-CM | POA: Insufficient documentation

## 2019-01-04 DIAGNOSIS — Z7982 Long term (current) use of aspirin: Secondary | ICD-10-CM | POA: Diagnosis not present

## 2019-01-04 DIAGNOSIS — K529 Noninfective gastroenteritis and colitis, unspecified: Secondary | ICD-10-CM | POA: Insufficient documentation

## 2019-01-04 DIAGNOSIS — H9190 Unspecified hearing loss, unspecified ear: Secondary | ICD-10-CM | POA: Insufficient documentation

## 2019-01-04 DIAGNOSIS — Z794 Long term (current) use of insulin: Secondary | ICD-10-CM | POA: Insufficient documentation

## 2019-01-04 LAB — BASIC METABOLIC PANEL
Anion gap: 7 (ref 5–15)
BUN: 12 mg/dL (ref 6–20)
CO2: 25 mmol/L (ref 22–32)
Calcium: 8.9 mg/dL (ref 8.9–10.3)
Chloride: 108 mmol/L (ref 98–111)
Creatinine, Ser: 0.95 mg/dL (ref 0.61–1.24)
GFR calc Af Amer: >60 mL/min (ref >60)
GFR calc non Af Amer: >60 mL/min (ref >60)
Glucose, Bld: 186 mg/dL — ABNORMAL HIGH (ref 70–99)
Potassium: 4.1 mmol/L (ref 3.5–5.1)
Sodium: 140 mmol/L (ref 135–145)

## 2019-01-04 LAB — DIGOXIN LEVEL: Digoxin Level: <0.2 ng/mL — ABNORMAL LOW (ref 0.8–2.0)

## 2019-01-04 MED ORDER — FUROSEMIDE 80 MG PO TABS: 80.0000 mg | ORAL_TABLET | Freq: Every day | ORAL | 3 refills | Status: DC

## 2019-01-04 MED ORDER — SACUBITRIL-VALSARTAN 24-26 MG PO TABS: 1.0000 | ORAL_TABLET | Freq: Two times a day (BID) | ORAL | 6 refills | Status: DC

## 2019-01-04 NOTE — Progress Notes (Signed)
  Echocardiogram 2D Echocardiogram has been performed.  Troy Rush M 01/04/2019, 1:40 PM

## 2019-01-04 NOTE — Patient Instructions (Addendum)
A refill for lasix have been sent to your pharmacy.  START Entresto 24/26mg  (1 tab) twice a day. Please let us know if you have any challenges with filling this prescription.   Labs today and repeat in 10 days  We will only contact you if something comes back abnormal or we need to make some changes. Otherwise no news is good news!  Your physician recommends that you schedule a follow-up appointment in: 3 weeks with Pharmacy and 6 weeks with Dr Aundra Dubin  At the [facility], you and your health needs are our priority. As part of our continuing mission to provide you with exceptional heart care, we have created designated Provider Care Teams. These Care Teams include your primary Cardiologist (physician) and Advanced Practice Providers (APPs- Physician Assistants and Nurse Practitioners) who all work together to provide you with the care you need, when you need it.   You may see any of the following providers on your designated Care Team at your next follow up: Marland Kitchen Dr Glori Bickers . Dr Loralie Champagne . Darrick Grinder, NP   Please be sure to bring in all your medications bottles to every appointment.

## 2019-01-06 NOTE — Progress Notes (Signed)
Advanced Heart Failure Clinic Note   PCP: Cammie Sickle Cardiology: Dr. Aundra Dubin  48 y.o. with deafness, HTN, DM, and chronic systolic CHF presents for followup of CHF.  Patient had no known cardiac problems until 10/16.  At that time, he developed exertional dyspnea and was admitted to a hospital in [place], [place] with acute systolic CHF.  Echo 10/16 showed EF 15-20%.  Cardiac cath showed no significant coronary disease.  He was diuresed and started on cardiac meds. Subsequently, he moved to [facility].  Now working in a [employer].  Repeat echo in 5/18 showed persistently low EF, 25-30%.  He had [place] placed.   Echo was done today and reviewed, EF 30-35% with diffuse hypokinesis.   He was admitted briefly with CHF exacerbation in 8/20.   Sign language interpreter present today.  He is taking all his meds.  Off spironolactone due to hyperkalemia.  He is now working in Water engineer at [employer].  No significant exertional dyspnea.  No chest pain.  Occasional orthopnea but not every night.    [employer] Heartlogic HF index = 0.   Labs (9/17): BNP 477, K 4.5 => 4.2, creatinine 0.92 => 1.1, BNP 1009, HIV negative, SPEP negative.  Labs (10/17): K 5.2, creatinine 1.10, hgb 12.5 Labs (03/28/2016): K 5.3 Creatinine 1.07  Labs (3/18): digoxin < 0.2, K 4.1, creatinine 0.97 Labs (9/18): hgb 12.2, K 4, creatinine 0.99, BNP 139 Labs (1/19): hgb 11.2, K 4.8, creatinine 1.26 Labs (8/20): K 3.7, creatinine 1.69  PMH: 1. HTN 2. Diabetes 3. Hyperlipidemia 4. Deafness since age 46: Needs sign language interpreter.  5. Chronic systolic CHF: Nonischemic cardiomyopathy.  Diagnosis 10/16 in [place], [place] (admitted with acute systolic CHF).  SPEP and HIV negative.  [place].  - Echo (10/16): EF 15-20%, mild LVH, mild MR, normal RV size and systolic function.  - LHC (10/16): No significant CAD.  - CPX (12/17): Mild functional limitation.  - 04/2016 CMRI: EF 27%.  RV normal. No evidence of infiltrative disease, myocarditis, or MI.  - Echo (5/18): EF 25-30%, mild LV dilation, mild LVH, normal RV size with mildly decreased - Echo (9/20): EF 30-35%, mild LVH, diffuse hypokinesis, normal RV size and systolic function.  6. Depression. 7. Colitis episodes.  8. CKD stage 3.   SH: From [place] originally.  Lives in [place] until 2017, then moved to [place].  Nonsmoker.  Used to drink 3-4 ETOH beverages/day until 10/16, now stopped. Works in Water engineer at [employer].    FH: No known cardiac disease.   ROS: All systems reviewed and negative except as per HPI.   Current Outpatient Medications  Medication Sig Dispense Refill  . aspirin 81 MG chewable tablet Chew 81 mg by mouth daily.    Marland Kitchen atorvastatin (LIPITOR) 80 MG tablet Take 1 tablet (80 mg total) by mouth daily. 90 tablet 3  . blood glucose meter kit and supplies KIT Dispense based on patient and insurance preference. Use up to four times daily as directed. (FOR ICD-9 250.00, 250.01). 1 each 0  . Blood Glucose Monitoring Suppl (ACCU-CHEK AVIVA PLUS) w/Device KIT 1 each by Does not apply route 4 (four) times daily -  before meals and at bedtime. E.11.8 1 kit 0  . canagliflozin (INVOKANA) 300 MG TABS tablet Take 1 tablet (300 mg total) by mouth daily before breakfast. 90 tablet 3  . carvedilol (COREG) 12.5 MG tablet Take 1.5 tablets (18.75 mg total) by mouth 2 (two) times daily.  240 tablet 3  . citalopram (CELEXA) 40 MG tablet Take 1 tablet (40 mg total) by mouth daily. 30 tablet 0  . digoxin (LANOXIN) 0.125 MG tablet Take 1 tablet (0.125 mg total) by mouth daily. 90 tablet 3  . furosemide (LASIX) 80 MG tablet Take 1 tablet (80 mg total) by mouth daily. 90 tablet 3  . hydrALAZINE (APRESOLINE) 25 MG tablet Take 1.5 tablets (37.5 mg total) by mouth 3 (three) times daily. 270 tablet 3  . isosorbide mononitrate (IMDUR) 30 MG 24 hr tablet Take 1 tablet (30 mg total) by mouth daily. 90 tablet 3  .  metFORMIN (GLUCOPHAGE) 1000 MG tablet Take 1 tablet (1,000 mg total) by mouth 2 (two) times daily with a meal. 180 tablet 3  . Accu-Chek Softclix Lancets lancets USE FOUR TIMES DAILY AS DIRECTED (Patient not taking: Reported on 01/04/2019) 400 each 3  . glucose blood (ACCU-CHEK AVIVA PLUS) test strip Check blood three times per day and at bedtime (Patient not taking: Reported on 01/04/2019) 200 each 12  . Insulin Glargine (LANTUS SOLOSTAR) 100 UNIT/ML Solostar Pen Inject 40 Units into the skin daily at 10 pm. (Patient not taking: Reported on 01/04/2019) 15 pen 3  . Lancet Devices (ACCU-CHEK SOFTCLIX) lancets Use as instructed (Patient not taking: Reported on 01/04/2019) 1 each 0  . promethazine (PHENERGAN) 25 MG tablet Take 1 tablet (25 mg total) by mouth every 6 (six) hours as needed for nausea or vomiting. (Patient not taking: Reported on 10/08/2018) 30 tablet 0  . sacubitril-valsartan (ENTRESTO) 24-26 MG Take 1 tablet by mouth 2 (two) times daily. 60 tablet 6   No current facility-administered medications for this encounter.    BP (!) 152/80   Pulse 84   Wt 107.3 kg (236 lb 9.6 oz)   SpO2 98%   BMI 34.94 kg/m    Wt Readings from Last 3 Encounters:  01/04/19 107.3 kg (236 lb 9.6 oz)  11/15/18 107.9 kg (237 lb 12.8 oz)  11/09/18 107.7 kg (237 lb 6.4 oz)    General: NAD Neck: JVP 8 cm, no thyromegaly or thyroid nodule.  Lungs: Clear to auscultation bilaterally with normal respiratory effort. CV: Nondisplaced PMI.  Heart regular S1/S2, no S3/S4, no murmur.  1+ ankle edema.  No carotid bruit.  Normal pedal pulses.  Abdomen: Soft, nontender, no hepatosplenomegaly, no distention.  Skin: Intact without lesions or rashes.  Neurologic: Alert and oriented x 3.  Psych: Normal affect. Extremities: No clubbing or cyanosis.  HEENT: Normal.   Assessment/Plan: 1. Chronic systolic CHF: Nonischemic cardiomyopathy, etiology uncertain. Consider prior ETOH or HTN.  He has a [place].  CMRI in  1/18 with EF 27%, RV normal, no evidence of infiltrative disease, myocarditis, or MI.  Echo was today and reviewed, EF 30-35%.  Mild volume overload on exam though Heartlogic score is not suggestive of volume overload. NYHA class II.  - Continue lasix 80 mg daily. BMET today. - I will start him on Entresto 24/26 bid, as I suspect he has mild volume overload, this should give him a bit of additional diuresis.  BMET 10 days.  - Continue Coreg 18.75 mg BID      - He is off spironolactone for now with prior hyperkalemia.  Will rechallenge in future with use of Lokelma or Veltasssa.    - Continue hydralazine 37.5 mg tid and Imdur 30 daily. - Continue digoxin 0.125 daily, check digoxin level today.  2. Type II diabetes: He is on canigliflozin.  3. HTN: BP elevated, adding Entresto.   4. Deafness: Sign language interpreter present at visit.  5. CKD: Stage 3.  Follow creatinine closely with addition of Entresto.   Followup with HF pharmacist in 3 wks for med titration and see me in 6 wks.   Loralie Champagne, MD  01/06/2019

## 2019-01-07 NOTE — Progress Notes (Signed)
Remote ICD transmission.   

## 2019-01-15 ENCOUNTER — Other Ambulatory Visit (HOSPITAL_COMMUNITY): Payer: Medicare Other

## 2019-01-16 ENCOUNTER — Other Ambulatory Visit: Payer: Self-pay

## 2019-01-16 ENCOUNTER — Emergency Department (HOSPITAL_COMMUNITY): Payer: Medicare Other

## 2019-01-16 ENCOUNTER — Emergency Department (HOSPITAL_COMMUNITY)
Admission: EM | Admit: 2019-01-16 | Discharge: 2019-01-16 | Disposition: A | Discharge status: Home / Self Care | Payer: Medicare Other | Attending: Emergency Medicine | Admitting: Emergency Medicine

## 2019-01-16 DIAGNOSIS — I509 Heart failure, unspecified: Secondary | ICD-10-CM

## 2019-01-16 DIAGNOSIS — Z20828 Contact with and (suspected) exposure to other viral communicable diseases: Secondary | ICD-10-CM | POA: Diagnosis not present

## 2019-01-16 DIAGNOSIS — Z7982 Long term (current) use of aspirin: Secondary | ICD-10-CM | POA: Insufficient documentation

## 2019-01-16 DIAGNOSIS — R0602 Shortness of breath: Secondary | ICD-10-CM | POA: Diagnosis not present

## 2019-01-16 DIAGNOSIS — Z794 Long term (current) use of insulin: Secondary | ICD-10-CM | POA: Diagnosis not present

## 2019-01-16 DIAGNOSIS — E119 Type 2 diabetes mellitus without complications: Secondary | ICD-10-CM | POA: Insufficient documentation

## 2019-01-16 DIAGNOSIS — I110 Hypertensive heart disease with heart failure: Secondary | ICD-10-CM | POA: Diagnosis not present

## 2019-01-16 DIAGNOSIS — Z95810 Presence of automatic (implantable) cardiac defibrillator: Secondary | ICD-10-CM | POA: Insufficient documentation

## 2019-01-16 DIAGNOSIS — Z79899 Other long term (current) drug therapy: Secondary | ICD-10-CM | POA: Insufficient documentation

## 2019-01-16 DIAGNOSIS — I5023 Acute on chronic systolic (congestive) heart failure: Secondary | ICD-10-CM | POA: Diagnosis not present

## 2019-01-16 LAB — CBC WITH DIFFERENTIAL/PLATELET
Abs Immature Granulocytes: 0.03 10*3/uL (ref 0.00–0.07)
Basophils Absolute: 0 10*3/uL (ref 0.0–0.1)
Basophils Relative: 0 %
Eosinophils Absolute: 0 10*3/uL (ref 0.0–0.5)
Eosinophils Relative: 1 %
HCT: 41.7 % (ref 39.0–52.0)
Hemoglobin: 12.9 g/dL — ABNORMAL LOW (ref 13.0–17.0)
Immature Granulocytes: 0 %
Lymphocytes Relative: 10 %
Lymphs Abs: 0.8 10*3/uL (ref 0.7–4.0)
MCH: 27.2 pg (ref 26.0–34.0)
MCHC: 30.9 g/dL (ref 30.0–36.0)
MCV: 87.8 fL (ref 80.0–100.0)
Monocytes Absolute: 0.6 10*3/uL (ref 0.1–1.0)
Monocytes Relative: 8 %
Neutro Abs: 6 10*3/uL (ref 1.7–7.7)
Neutrophils Relative %: 81 %
Platelets: 220 10*3/uL (ref 150–400)
RBC: 4.75 MIL/uL (ref 4.22–5.81)
RDW: 13.2 % (ref 11.5–15.5)
WBC: 7.5 10*3/uL (ref 4.0–10.5)
nRBC: 0 % (ref 0.0–0.2)

## 2019-01-16 LAB — BASIC METABOLIC PANEL
Anion gap: 8 (ref 5–15)
BUN: 32 mg/dL — ABNORMAL HIGH (ref 6–20)
CO2: 22 mmol/L (ref 22–32)
Calcium: 8.1 mg/dL — ABNORMAL LOW (ref 8.9–10.3)
Chloride: 102 mmol/L (ref 98–111)
Creatinine, Ser: 1.44 mg/dL — ABNORMAL HIGH (ref 0.61–1.24)
GFR calc Af Amer: >60 mL/min (ref >60)
GFR calc non Af Amer: 57 mL/min — ABNORMAL LOW (ref >60)
Glucose, Bld: 314 mg/dL — ABNORMAL HIGH (ref 70–99)
Potassium: 5.7 mmol/L — ABNORMAL HIGH (ref 3.5–5.1)
Sodium: 132 mmol/L — ABNORMAL LOW (ref 135–145)

## 2019-01-16 LAB — DIGOXIN LEVEL: Digoxin Level: <0.2 ng/mL — ABNORMAL LOW (ref 0.8–2.0)

## 2019-01-16 LAB — BRAIN NATRIURETIC PEPTIDE: B Natriuretic Peptide: 200 pg/mL — ABNORMAL HIGH (ref 0.0–100.0)

## 2019-01-16 MED ORDER — FUROSEMIDE 10 MG/ML IJ SOLN
80.0000 mg | Freq: Once | INTRAMUSCULAR | Status: AC
  Administered 2019-01-16: 14:00:00 80 mg via INTRAVENOUS
  Filled 2019-01-16: qty 8

## 2019-01-16 MED ORDER — FUROSEMIDE 80 MG PO TABS: 80.0000 mg | ORAL_TABLET | Freq: Every day | ORAL | 0 refills | Status: DC

## 2019-01-16 NOTE — Discharge Instructions (Addendum)
Please make sure you increase your Lasix.  Take 80 mg in the morning and take 40 mg at nighttime.  Do this until you see cardiology next week.  Make sure you follow-up with Dr. Aundra Dubin next week.

## 2019-01-16 NOTE — ED Notes (Signed)
Got patient on the monitor patient is resting with call bell in reach  ?

## 2019-01-16 NOTE — ED Provider Notes (Addendum)
[facility] EMERGENCY DEPARTMENT Provider Note   CSN: [id number] Arrival date & time: 01/16/19  0841     History   Chief Complaint Chief Complaint  Patient presents with  . Shortness of Breath    HPI Troy Rush is a 48 y.o. male.     Patient is a 48 year old male with past medical history of CHF with AICD in place, diabetes, depression, dyslipidemia, deafness, hypertension presenting to the emergency department for shortness of breath.  Patient reports that the shortness of breath began this morning.  Reports that he has dyspnea on exertion.  Also reports that he had some difficulty sleeping last night due to feeling short of breath while laying down.  Reports that he noticed that he had more swelling in his legs this morning with tightness in trying to put on his shoes.  He has had a little bit of a dry cough.  Reports somewhat of a runny nose and sneezing.  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  Denies any chest pain, fever, chills, nausea, vomiting, dysuria, syncope.  He reports that he is taking all of his medications as prescribed and took them all this morning.     Past Medical History:  Diagnosis Date  . AICD (automatic cardioverter/defibrillator) present 10/14/2016  . Bronchitis   . CHF (congestive heart failure) ([place])   . Depression   . Diabetes mellitus without complication ([place])   . Dyslipidemia   . Hypertension   . NICM (nonischemic cardiomyopathy) ([place]) 09/15/2016  . Pancreatitis     Patient Active Problem List   Diagnosis Date Noted  . Abdominal pain 11/14/2018  . Ischemic cardiomyopathy 10/14/2016  . NICM (nonischemic cardiomyopathy) ([place]) 09/15/2016  . Chronic systolic CHF (congestive heart failure) ([place]) 05/30/2016  . HTN (hypertension) 05/30/2016  . Deaf 05/30/2016  . Hyperkalemia 05/30/2016  . Diabetes ([place]) 03/21/2016  . Major depressive disorder, recurrent episode ([place]) 03/21/2016    Past Surgical  History:  Procedure Laterality Date  . BACK SURGERY    . CARDIAC CATHETERIZATION    . ICD IMPLANT  10/14/2016  . ICD IMPLANT N/A 10/14/2016   Procedure: ICD Implant;  Surgeon: Deboraha Sprang, MD;  Location: [facility];  Service: Cardiovascular;  Laterality: N/A;  . WRIST SURGERY          Home Medications    Prior to Admission medications   Medication Sig Start Date End Date Taking? Authorizing Provider  Accu-Chek Softclix Lancets lancets USE FOUR TIMES DAILY AS DIRECTED 10/11/18   Lanae Boast, FNP  aspirin 81 MG chewable tablet Chew 81 mg by mouth daily.    [provider]  atorvastatin (LIPITOR) 80 MG tablet Take 1 tablet (80 mg total) by mouth daily. 07/13/18   Lanae Boast, FNP  blood glucose meter kit and supplies KIT Dispense based on patient and insurance preference. Use up to four times daily as directed. (FOR ICD-9 250.00, 250.01). 03/21/16   Micheline Chapman, NP  Blood Glucose Monitoring Suppl (ACCU-CHEK AVIVA PLUS) w/Device KIT 1 each by Does not apply route 4 (four) times daily -  before meals and at bedtime. E.11.8 07/13/18   Lanae Boast, FNP  canagliflozin [facility]) 300 MG TABS tablet Take 1 tablet (300 mg total) by mouth daily before breakfast. 07/13/18   Lanae Boast, FNP  carvedilol (COREG) 12.5 MG tablet Take 1.5 tablets (18.75 mg total) by mouth 2 (two) times daily. 08/17/18 08/12/19  Larey Dresser, MD  citalopram (CELEXA) 40 MG tablet Take 1 tablet (40 mg total) by mouth daily. 11/16/18   Swayze, Ava, DO  digoxin (LANOXIN) 0.125 MG tablet Take 1 tablet (0.125 mg total) by mouth daily. 08/27/18   Georgiana Shore, NP  furosemide (LASIX) 80 MG tablet TAKE 1 TABLET(80 MG) BY MOUTH TWICE DAILY 01/28/19   Larey Dresser, MD  gabapentin (NEURONTIN) 300 MG capsule Take 1 capsule (300 mg total) by mouth 2 (two) times daily. 02/14/19   Montine Circle, PA-C  glucose blood (ACCU-CHEK AVIVA PLUS) test strip Check blood three times per day and at bedtime 05/09/17    Scot Jun, FNP  hydrALAZINE (APRESOLINE) 25 MG tablet Take 1.5 tablets (37.5 mg total) by mouth 3 (three) times daily. 11/09/18 02/07/19  Darrick Grinder D, NP  Insulin Glargine (LANTUS SOLOSTAR) 100 UNIT/ML Solostar Pen Inject 40 Units into the skin daily at 10 pm. 07/13/18   Lanae Boast, FNP  isosorbide mononitrate (IMDUR) 30 MG 24 hr tablet Take 1 tablet (30 mg total) by mouth daily. 10/08/18 10/03/19  Darrick Grinder D, NP  Lancet Devices [facility]) lancets Use as instructed 10/17/16   Dorena Dew, FNP  metFORMIN (GLUCOPHAGE) 1000 MG tablet Take 1 tablet (1,000 mg total) by mouth 2 (two) times daily with a meal. 07/13/18   Lanae Boast, FNP  promethazine (PHENERGAN) 25 MG tablet Take 1 tablet (25 mg total) by mouth every 6 (six) hours as needed for nausea or vomiting. Patient not taking: Reported on 10/08/2018 02/02/16   Shary Decamp, PA-C  sacubitril-valsartan (ENTRESTO) 49-51 MG Take 1 tablet by mouth 2 (two) times daily. 01/24/19   Larey Dresser, MD    Family History Family History  Problem Relation Age of Onset  . Hypertension Mother     Social History Social History   Tobacco Use  . Smoking status: Never Smoker  . Smokeless tobacco: Never Used  Substance Use Topics  . Alcohol use: Yes    Comment: rare  . Drug use: No     Allergies   Lisinopril   Review of Systems Review of Systems  Constitutional: Negative for chills and fever.  HENT: Negative for congestion and sore throat.   Respiratory: Positive for shortness of breath. Negative for apnea, cough, choking, chest tightness, wheezing and stridor.   Cardiovascular: Positive for leg swelling. Negative for chest pain and palpitations.  Gastrointestinal: Negative for abdominal pain, diarrhea, nausea and vomiting.  Genitourinary: Negative for dysuria.  Musculoskeletal: Negative for back pain.  Skin: Negative for rash.  Neurological: Negative for dizziness, syncope and light-headedness.     Physical Exam  Updated Vital Signs BP (!) 145/94 (BP Location: Right Arm)   Pulse 81   Temp 98.5 F (36.9 C) (Oral)   Resp 20   SpO2 99%   Physical Exam Vitals signs and nursing note reviewed.  Constitutional:      Appearance: Normal appearance.  HENT:     Head: Normocephalic.  Eyes:     Conjunctiva/sclera: Conjunctivae normal.     Pupils: Pupils are equal, round, and reactive to light.  Cardiovascular:     Rate and Rhythm: Normal rate.  Pulmonary:     Effort: Pulmonary effort is normal. No tachypnea or respiratory distress.     Breath sounds: Normal breath sounds. No decreased breath sounds, wheezing, rhonchi or rales.  Chest:     Chest wall: No mass.  Musculoskeletal:     Right lower leg: Edema present.     Left  lower leg: Edema (1+ pitting edema to the midshin.  Bilaterally) present.  Skin:    General: Skin is warm and dry.  Neurological:     General: No focal deficit present.     Mental Status: He is alert.  Psychiatric:        Mood and Affect: Mood normal.      ED Treatments / Results  Labs (all labs ordered are listed, but only abnormal results are displayed) Labs Reviewed  CBC WITH DIFFERENTIAL/PLATELET - Abnormal; Notable for the following components:      Result Value   Hemoglobin 12.9 (*)    All other components within normal limits  BASIC METABOLIC PANEL - Abnormal; Notable for the following components:   Sodium 132 (*)    Potassium 5.7 (*)    Glucose, Bld 314 (*)    BUN 32 (*)    Creatinine, Ser 1.44 (*)    Calcium 8.1 (*)    GFR calc non Af Amer 57 (*)    All other components within normal limits  BRAIN NATRIURETIC PEPTIDE - Abnormal; Notable for the following components:   B Natriuretic Peptide 200.0 (*)    All other components within normal limits  DIGOXIN LEVEL - Abnormal; Notable for the following components:   Digoxin Level <0.2 (*)    All other components within normal limits  NOVEL CORONAVIRUS, NAA (HOSP ORDER, SEND-OUT TO REF LAB; TAT 18-24 HRS)     EKG EKG Interpretation  Date/Time:  Wednesday January 16 2019 08:49:18 EDT Ventricular Rate:  79 PR Interval:  198 QRS Duration: 88 QT Interval:  428 QTC Calculation: 490 R Axis:   71 Text Interpretation:  Normal sinus rhythm Low voltage QRS Cannot rule out Anterior infarct , age undetermined Abnormal ECG Confirmed by Madalyn Rob [phone]) on 01/17/2019 9:25:30 AM   Radiology No results found.  Procedures Procedures (including critical care time)  Medications Ordered in ED Medications  furosemide (LASIX) injection 80 mg (80 mg Intravenous Given 01/16/19 1351)     Initial Impression / Assessment and Plan / ED Course  I have reviewed the triage vital signs and the nursing notes.  Pertinent labs & imaging results that were available during my care of the patient were reviewed by me and considered in my medical decision making (see chart for details).  Clinical Course as of Feb 27 1157  Wed Jan 16, 2019  1216 Patient with hx CHF, HTN presenting to the ER for SOB since this morning associated with orthopnea and dyspnea on exertion. No CP. Likely CHF exacerbation given history. Mild elevation in BNP to 200 and mild pulmonary vascular congestion on the chest xray. Labs significant for potassium of 5.7 but this sample was hemolyzed. No concerning EKG findings.    [KM]  1232 I spoke with Dr. Aundra Dubin regarding this patient. He advised to give patient 9m IV lasix now and add 412mof home PO lasix until they follow up next week.    [KM]    Clinical Course User Index [KM] McAlveria ApleyPA-C        Counseled pt on good return precautions and encouraged both PCP and ED follow-up as needed.  Prior to discharge, I also discussed incidental imaging findings with patient in detail and advised appropriate, recommended follow-up in detail.  Clinical Impression: 1. Acute on chronic congestive heart failure, unspecified [facility] failure [facility]    Disposition: Discharge   This  note was prepared with assistance of Dragon voice recognition  software. Occasional wrong-word or sound-a-like substitutions may have occurred due to the inherent limitations of voice recognition software.   Final Clinical Impressions(s) / ED Diagnoses   Final diagnoses:  Acute on chronic congestive heart failure, unspecified heart failure type [facility])    ED Discharge Orders         Ordered    furosemide (LASIX) 80 MG tablet  Daily,   Status:  Discontinued     01/16/19 1248        CRITICAL CARE Performed by: Alveria Apley   Total critical care time: 35  minutes  Critical care time was exclusive of separately billable procedures and treating other patients.  Critical care was necessary to treat or prevent imminent or life-threatening deterioration.  Critical care was time spent personally by me on the following activities: development of treatment plan with patient and/or surrogate as well as nursing, discussions with consultants, evaluation of patient's response to treatment, examination of patient, obtaining history from patient or surrogate, ordering and performing treatments and interventions, ordering and review of laboratory studies, ordering and review of radiographic studies, pulse oximetry and re-evaluation of patient's condition.    Alveria Apley, PA-C 01/16/19 1410    Lucrezia Starch, MD 01/17/19 0925    Alveria Apley, PA-C 02/28/19 1159    Lucrezia Starch, MD 02/28/19 [phone]

## 2019-01-16 NOTE — ED Triage Notes (Signed)
Patient complains of shortness of breath x 1-2 and reports cough and mild congestion. Patient is deaf and translator phone being retrieved by [employer] supervisor. Denies CP, denies fever

## 2019-01-17 LAB — NOVEL CORONAVIRUS, NAA (HOSP ORDER, SEND-OUT TO REF LAB; TAT 18-24 HRS): SARS-CoV-2, NAA: NOT DETECTED

## 2019-01-19 ENCOUNTER — Other Ambulatory Visit: Payer: Self-pay | Admitting: Cardiology

## 2019-01-23 ENCOUNTER — Encounter (HOSPITAL_COMMUNITY): Payer: Medicare Other

## 2019-01-24 ENCOUNTER — Other Ambulatory Visit: Payer: Self-pay

## 2019-01-24 ENCOUNTER — Ambulatory Visit (HOSPITAL_COMMUNITY)
Admission: RE | Admit: 2019-01-24 | Discharge: 2019-01-24 | Disposition: A | Discharge status: Home / Self Care | Payer: Medicare Other | Source: Ambulatory Visit | Attending: Internal Medicine | Admitting: Internal Medicine

## 2019-01-24 VITALS — BP 150/92 | HR 72 | Wt 231.4 lb

## 2019-01-24 DIAGNOSIS — I428 Other cardiomyopathies: Secondary | ICD-10-CM | POA: Diagnosis not present

## 2019-01-24 DIAGNOSIS — N183 Chronic kidney disease, stage 3 unspecified: Secondary | ICD-10-CM | POA: Diagnosis not present

## 2019-01-24 DIAGNOSIS — I5022 Chronic systolic (congestive) heart failure: Secondary | ICD-10-CM | POA: Insufficient documentation

## 2019-01-24 DIAGNOSIS — H9190 Unspecified hearing loss, unspecified ear: Secondary | ICD-10-CM | POA: Insufficient documentation

## 2019-01-24 DIAGNOSIS — Z79899 Other long term (current) drug therapy: Secondary | ICD-10-CM | POA: Diagnosis not present

## 2019-01-24 DIAGNOSIS — I110 Hypertensive heart disease with heart failure: Secondary | ICD-10-CM | POA: Insufficient documentation

## 2019-01-24 DIAGNOSIS — Z794 Long term (current) use of insulin: Secondary | ICD-10-CM | POA: Diagnosis not present

## 2019-01-24 DIAGNOSIS — I130 Hypertensive heart and chronic kidney disease with heart failure and stage 1 through stage 4 chronic kidney disease, or unspecified chronic kidney disease: Secondary | ICD-10-CM | POA: Insufficient documentation

## 2019-01-24 DIAGNOSIS — E1122 Type 2 diabetes mellitus with diabetic chronic kidney disease: Secondary | ICD-10-CM | POA: Diagnosis not present

## 2019-01-24 DIAGNOSIS — E119 Type 2 diabetes mellitus without complications: Secondary | ICD-10-CM | POA: Diagnosis not present

## 2019-01-24 MED ORDER — SACUBITRIL-VALSARTAN 49-51 MG PO TABS: 1.0000 | ORAL_TABLET | Freq: Two times a day (BID) | ORAL | 6 refills | Status: DC

## 2019-01-24 MED ORDER — FUROSEMIDE 80 MG PO TABS: 80.0000 mg | ORAL_TABLET | Freq: Two times a day (BID) | ORAL | 0 refills | Status: DC

## 2019-01-24 NOTE — Patient Instructions (Signed)
It was a pleasure seeing you today!  MEDICATIONS: -We are changing your medications today -Increase furosemide to 80 mg twice daily -Increase Entresto to 49/51 mg (1 tab) twice daily -Call if you have questions about your medications.  LABS: -We will call you if your labs need attention.  NEXT APPOINTMENT: Return to clinic in 3 weeks with Dr. Aundra Dubin.  In general, to take care of your heart failure: -Limit your fluid intake to 2 Liters (half-gallon) per day.   -Limit your salt intake to ideally 2-3 grams (2000-3000 mg) per day. -Weigh yourself daily and record, and bring that "weight diary" to your next appointment.  (Weight gain of 2-3 pounds in 1 day typically means fluid weight.) -The medications for your heart are to help your heart and help you live longer.   -Please contact us before stopping any of your heart medications.  Call the clinic at [phone] with questions or to reschedule future appointments.

## 2019-01-26 ENCOUNTER — Other Ambulatory Visit (HOSPITAL_COMMUNITY): Payer: Self-pay | Admitting: Cardiology

## 2019-01-27 NOTE — Progress Notes (Signed)
PCP: Cammie Sickle Cardiology: Dr. Aundra Dubin  HPI:  48 y.o. with deafness, HTN, DM, and chronic systolic CHF presents for followup of CHF.  Patient had no known cardiac problems until 01/2015.  At that time, he developed exertional dyspnea and was admitted to a hospital in [place], [place] with acute systolic CHF.  Echo 10/16 showed EF 15-20%.  Cardiac cath showed no significant coronary disease.  He was diuresed and started on cardiac meds. Subsequently, he moved to [facility].  Now working in a [employer].  Repeat echo in 5/18 showed persistently low EF, 25-30%.  He had [place] placed.   He was admitted briefly with CHF exacerbation in 8/20.   Echo was done 01/04/19 and reviewed, EF 30-35% with diffuse hypokinesis.   ED visit 01/16/2019 for volume overload.  Sign language interpreter present today.  He is taking all his meds.  Off spironolactone due to hyperkalemia.  He is now working in Water engineer at [employer].  No significant exertional dyspnea.  No chest pain.  Occasional orthopnea but not every night.  Recent presentation to HF Clinic on 01/04/2019. At that visit, Entresto 24/26 mg BID was initiated. He was slightly volume overloaded at that visit, with JVP 8 cm and 1+ ankle edema, although Heartlogic score was not suggestive of volume overload. He then presented to the ED on 01/16/19 complaining of SOB/DOE x1-2 days, cough, orthopnea and ankle swelling. He was diuresed with Furosemide 80 mg IV x1 and his furosemide 80 mg daily was increased to furosemide 80 mg every morning and 40 mg every evening until follow-up.   Today he presents to pharmacy clinic for follow up and medication titration. Sign language interpreter present today. He reports feeling well since his ED visit on 10/07. No dizziness, lightheadedness, chest pain or palpitations. His breathing is much better. He stays active, walking 1/2 hour to and from work every day and he stays on his feet for his job  (works at [employer] in [employer]). He does not weigh daily at home, but believes his weight has decreased since starting the increased dose of furosemide. He has trace edema in his ankles. He sleeps on 3 pillows, which is stable and does not report PND. Does think he sleeps much since furosemide increased. ReDS reading 41% today. His appetite is good, he avoids salty foods. We discussed limiting his fluid intake to 2 L/day.      . Shortness of breath/dyspnea on exertion? no  . Orthopnea/PND? Yes - 3 pillows, no PND . Edema? Trace pitting edema in ankles . Lightheadedness/dizziness? no . Daily weights at home? no . Blood pressure/heart rate monitoring at home? no . Following low-sodium/fluid-restricted diet? Avoids salty foods. Does not fluid restrict. We discussed that although water is "good for you" that limiting fluid intake is important to help improve fluid status.  HF Medications: Carvedilol 18.75 mg BID Entresto 24/26 mg BID Hydralazine 37.5 mg TID Isosorbide mononitrate 30 mg daily Canagliflozin 300 mg daily Digoxin 0.125 mg daily Furosemide 80 mg QAM, 40 mg QPM (increased from 80 mg daily on 01/16/19)  Has the patient been experiencing any side effects to the medications prescribed?  no  Does the patient have any problems obtaining medications due to transportation or finances?   No - Has Medicare   Understanding of regimen: good Understanding of indications: good Potential of compliance: excellent Patient understands to avoid NSAIDs. Patient understands to avoid decongestants.    Pertinent Lab Values: -01/24/19 Labs hemolyzed, results  unavailable -01/04/19: Serum creatinine 0.95, BUN 12, Potassium 4.1, Sodium 140, BNP 200.0 (01/16/19), Digoxin 0.2 ng/mL   Vital Signs: . Weight: 231.4 lbs (last clinic weight: 236.6 lbs) . Blood pressure: 150/92  . Heart rate: 72   Assessment: 1. Chronic systolic CHF: Nonischemic cardiomyopathy, etiology uncertain.  Consider prior ETOH or HTN.  He has a [place].  CMRI in 1/18 with EF 27%, RV normal, no evidence of infiltrative disease, myocarditis, or MI.  Echo 01/04/2019 showed EF 30-35%.   -Volume elevated on exam, ReDS 41%. However, volume status is improving on increased furosemide dose.  - NYHA class II.  - Vitals: BP elevated at 150/92, HR 72 - Labs drawn today were hemolyzed, results not available. - After discussion with Dr. Aundra Dubin, will increase furosemide to 80 mg twice daily given elevated ReDS reading.   - Continue Coreg 18.75 mg BID    - Increase Entresto to 49/51 mg BID.  BMET at next visit - He is off spironolactone for now with prior hyperkalemia. Notably many samples were hemolyzed so difficult to assess extent of hyperkalemia. Will rechallenge in future with use of Lokelma. Pharmacy benefits investigation: Lokelma $3.90, Veltassa is non-preferred.    - Continue hydralazine 37.5 mg tid and Imdur 30 daily. - Continue digoxin 0.125 daily. 01/16/2019 Digoxin level WNL at 0.2 ng/mL. -Continue canagliflozin 300 mg daily. Canagliflozin does not have as much robust data for use in heart failure as dapagliflozin and empagliflozin. However, he has concomitant T2DM so reasonable to continue with current therapy rather than change agents.  2. Type II diabetes:  -Continue metformin 1000 mg BID, canagliflozin 300 mg daily and Lantus 40 units daily.  3. HTN: BP elevated today -Increase Entresto and Continue carvedilol, hydralazine and Imdur as above.  4. Deafness: Sign language interpreter present at visit.  5. CKD: Stage 3.  Follow creatinine closely with addition of Entresto. Labs today were hemolyzed and unavailable for assessment.   Plan: 1) Medication changes: Based on clinical presentation, vital signs and recent labs will Increase furosemide to 80 mg twice daily and increase Entresto to 49/51 mg BID. 2) Labs: hemolyzed (results not reported) 3) Follow-up: HF Clinic visit with Dr.  Aundra Dubin on 02/12/2019  Audry Riles, PharmD, BCPS, CPP Heart Failure Clinic Pharmacist [phone]  Patient discussed with Dr. Lynelle Smoke.  Agree with assessment and plan.  Will follow up labs at next visit.   Bonnita Nasuti Pharm.D. CPP, BCPS Clinical Pharmacist [phone] 01/27/2019 3:05 PM

## 2019-01-29 ENCOUNTER — Telehealth (HOSPITAL_COMMUNITY): Payer: Self-pay | Admitting: Pharmacist

## 2019-01-29 NOTE — Telephone Encounter (Signed)
Spoke with Troy Rush about coming back to the clinic to repeat labs as the last labs were hemolyzed and unusable. He will come in on 10/20/20for a repeat BMET.   Audry Riles, PharmD, BCPS, CPP Heart Failure Clinic Pharmacist [phone]

## 2019-01-30 ENCOUNTER — Other Ambulatory Visit: Payer: Self-pay

## 2019-01-30 ENCOUNTER — Ambulatory Visit (HOSPITAL_COMMUNITY)
Admission: RE | Admit: 2019-01-30 | Discharge: 2019-01-30 | Disposition: A | Discharge status: Home / Self Care | Payer: Medicare Other | Source: Ambulatory Visit | Attending: Internal Medicine | Admitting: Internal Medicine

## 2019-01-30 DIAGNOSIS — I5022 Chronic systolic (congestive) heart failure: Secondary | ICD-10-CM | POA: Diagnosis not present

## 2019-01-30 LAB — BASIC METABOLIC PANEL
Anion gap: 11 (ref 5–15)
BUN: 30 mg/dL — ABNORMAL HIGH (ref 6–20)
CO2: 26 mmol/L (ref 22–32)
Calcium: 9.9 mg/dL (ref 8.9–10.3)
Chloride: 103 mmol/L (ref 98–111)
Creatinine, Ser: 1.56 mg/dL — ABNORMAL HIGH (ref 0.61–1.24)
GFR calc Af Amer: 60 mL/min — ABNORMAL LOW (ref >60)
GFR calc non Af Amer: 51 mL/min — ABNORMAL LOW (ref >60)
Glucose, Bld: 112 mg/dL — ABNORMAL HIGH (ref 70–99)
Potassium: 4.8 mmol/L (ref 3.5–5.1)
Sodium: 140 mmol/L (ref 135–145)

## 2019-02-12 ENCOUNTER — Encounter (HOSPITAL_COMMUNITY): Payer: Medicare Other | Admitting: Cardiology

## 2019-02-14 ENCOUNTER — Encounter (HOSPITAL_COMMUNITY): Payer: Self-pay

## 2019-02-14 ENCOUNTER — Emergency Department (HOSPITAL_COMMUNITY)
Admission: EM | Admit: 2019-02-14 | Discharge: 2019-02-14 | Disposition: A | Discharge status: Home / Self Care | Payer: Medicare Other | Attending: Emergency Medicine | Admitting: Emergency Medicine

## 2019-02-14 DIAGNOSIS — E1165 Type 2 diabetes mellitus with hyperglycemia: Secondary | ICD-10-CM | POA: Diagnosis not present

## 2019-02-14 DIAGNOSIS — I509 Heart failure, unspecified: Secondary | ICD-10-CM | POA: Diagnosis not present

## 2019-02-14 DIAGNOSIS — Z794 Long term (current) use of insulin: Secondary | ICD-10-CM | POA: Diagnosis not present

## 2019-02-14 DIAGNOSIS — Z7982 Long term (current) use of aspirin: Secondary | ICD-10-CM | POA: Diagnosis not present

## 2019-02-14 DIAGNOSIS — M79676 Pain in unspecified toe(s): Secondary | ICD-10-CM | POA: Diagnosis not present

## 2019-02-14 DIAGNOSIS — Z79899 Other long term (current) drug therapy: Secondary | ICD-10-CM | POA: Insufficient documentation

## 2019-02-14 DIAGNOSIS — R52 Pain, unspecified: Secondary | ICD-10-CM | POA: Diagnosis not present

## 2019-02-14 DIAGNOSIS — I110 Hypertensive heart disease with heart failure: Secondary | ICD-10-CM | POA: Insufficient documentation

## 2019-02-14 DIAGNOSIS — E0842 Diabetes mellitus due to underlying condition with diabetic polyneuropathy: Secondary | ICD-10-CM | POA: Diagnosis not present

## 2019-02-14 DIAGNOSIS — K869 Disease of pancreas, unspecified: Secondary | ICD-10-CM | POA: Diagnosis not present

## 2019-02-14 DIAGNOSIS — I10 Essential (primary) hypertension: Secondary | ICD-10-CM | POA: Diagnosis not present

## 2019-02-14 MED ORDER — GABAPENTIN 300 MG PO CAPS: 300.0000 mg | ORAL_CAPSULE | Freq: Two times a day (BID) | ORAL | 0 refills | Status: DC

## 2019-02-14 MED ORDER — ONDANSETRON 8 MG PO TBDP
8.0000 mg | ORAL_TABLET | Freq: Once | ORAL | Status: AC
  Administered 2019-02-14: 05:00:00 8 mg via ORAL
  Filled 2019-02-14: qty 1

## 2019-02-14 MED ORDER — MECLIZINE HCL 25 MG PO TABS
25.0000 mg | ORAL_TABLET | Freq: Once | ORAL | Status: AC
  Administered 2019-02-14: 25 mg via ORAL
  Filled 2019-02-14: qty 1

## 2019-02-14 NOTE — ED Notes (Signed)
Reviewed discharge instructions and prescription with pt. Pt verbalizes understanding.

## 2019-02-14 NOTE — ED Provider Notes (Signed)
[facility] DEPT Provider Note   CSN: [id number] Arrival date & time: 02/14/19  3888     History   Chief Complaint Chief Complaint  Patient presents with  . Toe Pain    HPI Troy Rush is a 48 y.o. male.     Patient presents to the emergency department with a chief complaint of bilateral toe numbness.  He states that this has been ongoing for the past day or so.  He has diabetes, but has been poorly controlled due to noncompliance.  He denies having taken anything for his symptoms.  Denies any fevers or chills.  The history is provided by the patient. History limited by: Patient is deaf, history obtained via writing. No language interpreter was used.    Past Medical History:  Diagnosis Date  . AICD (automatic cardioverter/defibrillator) present 10/14/2016  . Bronchitis   . CHF (congestive heart failure) ([place])   . Depression   . Diabetes mellitus without complication ([place])   . Dyslipidemia   . Hypertension   . NICM (nonischemic cardiomyopathy) ([place]) 09/15/2016  . Pancreatitis     Patient Active Problem List   Diagnosis Date Noted  . Abdominal pain 11/14/2018  . Ischemic cardiomyopathy 10/14/2016  . NICM (nonischemic cardiomyopathy) ([place]) 09/15/2016  . Chronic systolic CHF (congestive heart failure) ([place]) 05/30/2016  . HTN (hypertension) 05/30/2016  . Deaf 05/30/2016  . Hyperkalemia 05/30/2016  . Diabetes ([place]) 03/21/2016  . Major depressive disorder, recurrent episode ([place]) 03/21/2016    Past Surgical History:  Procedure Laterality Date  . BACK SURGERY    . CARDIAC CATHETERIZATION    . ICD IMPLANT  10/14/2016  . ICD IMPLANT N/A 10/14/2016   Procedure: ICD Implant;  Surgeon: Deboraha Sprang, MD;  Location: [facility];  Service: Cardiovascular;  Laterality: N/A;  . WRIST SURGERY          Home Medications    Prior to Admission medications   Medication Sig Start Date End Date Taking? Authorizing Provider  Accu-Chek  Softclix Lancets lancets USE FOUR TIMES DAILY AS DIRECTED 10/11/18   Lanae Boast, FNP  aspirin 81 MG chewable tablet Chew 81 mg by mouth daily.    [provider]  atorvastatin (LIPITOR) 80 MG tablet Take 1 tablet (80 mg total) by mouth daily. 07/13/18   Lanae Boast, FNP  blood glucose meter kit and supplies KIT Dispense based on patient and insurance preference. Use up to four times daily as directed. (FOR ICD-9 250.00, 250.01). 03/21/16   Micheline Chapman, NP  Blood Glucose Monitoring Suppl (ACCU-CHEK AVIVA PLUS) w/Device KIT 1 each by Does not apply route 4 (four) times daily -  before meals and at bedtime. E.11.8 07/13/18   Lanae Boast, FNP  canagliflozin [facility]) 300 MG TABS tablet Take 1 tablet (300 mg total) by mouth daily before breakfast. 07/13/18   Lanae Boast, FNP  carvedilol (COREG) 12.5 MG tablet Take 1.5 tablets (18.75 mg total) by mouth 2 (two) times daily. 08/17/18 08/12/19  Larey Dresser, MD  citalopram (CELEXA) 40 MG tablet Take 1 tablet (40 mg total) by mouth daily. 11/16/18   Swayze, Ava, DO  digoxin (LANOXIN) 0.125 MG tablet Take 1 tablet (0.125 mg total) by mouth daily. 08/27/18   Georgiana Shore, NP  furosemide (LASIX) 80 MG tablet TAKE 1 TABLET(80 MG) BY MOUTH TWICE DAILY 01/28/19   Larey Dresser, MD  gabapentin (NEURONTIN) 300 MG capsule Take 1 capsule (300 mg total) by mouth 2 (  two) times daily. 02/14/19   Montine Circle, PA-C  glucose blood (ACCU-CHEK AVIVA PLUS) test strip Check blood three times per day and at bedtime 05/09/17   Scot Jun, FNP  hydrALAZINE (APRESOLINE) 25 MG tablet Take 1.5 tablets (37.5 mg total) by mouth 3 (three) times daily. 11/09/18 02/07/19  Darrick Grinder D, NP  Insulin Glargine (LANTUS SOLOSTAR) 100 UNIT/ML Solostar Pen Inject 40 Units into the skin daily at 10 pm. 07/13/18   Lanae Boast, FNP  isosorbide mononitrate (IMDUR) 30 MG 24 hr tablet Take 1 tablet (30 mg total) by mouth daily. 10/08/18 10/03/19  Darrick Grinder D, NP  Lancet  Devices [facility]) lancets Use as instructed 10/17/16   Dorena Dew, FNP  metFORMIN (GLUCOPHAGE) 1000 MG tablet Take 1 tablet (1,000 mg total) by mouth 2 (two) times daily with a meal. 07/13/18   Lanae Boast, FNP  promethazine (PHENERGAN) 25 MG tablet Take 1 tablet (25 mg total) by mouth every 6 (six) hours as needed for nausea or vomiting. Patient not taking: Reported on 10/08/2018 02/02/16   Shary Decamp, PA-C  sacubitril-valsartan (ENTRESTO) 49-51 MG Take 1 tablet by mouth 2 (two) times daily. 01/24/19   Larey Dresser, MD    Family History Family History  Problem Relation Age of Onset  . Hypertension Mother     Social History Social History   Tobacco Use  . Smoking status: Never Smoker  . Smokeless tobacco: Never Used  Substance Use Topics  . Alcohol use: Yes    Comment: rare  . Drug use: No     Allergies   Lisinopril   Review of Systems Review of Systems  All other systems reviewed and are negative.    Physical Exam Updated Vital Signs BP (!) 173/113 (BP Location: Left Arm)   Pulse 89   Temp 98.1 F (36.7 C) (Oral)   Resp 16   SpO2 99%   Physical Exam Vitals signs and nursing note reviewed.  Constitutional:      General: He is not in acute distress.    Appearance: He is well-developed. He is not ill-appearing.  HENT:     Head: Normocephalic and atraumatic.  Eyes:     Conjunctiva/sclera: Conjunctivae normal.  Neck:     Musculoskeletal: Neck supple.  Cardiovascular:     Rate and Rhythm: Normal rate.     Comments: Intact distal pulses Pulmonary:     Effort: Pulmonary effort is normal. No respiratory distress.  Abdominal:     General: There is no distension.  Musculoskeletal:     Comments: Moves all extremities  Skin:    General: Skin is warm and dry.     Comments: No evidence of rash or infection  Neurological:     Mental Status: He is alert and oriented to person, place, and time.  Psychiatric:        Mood and Affect: Mood  normal.        Behavior: Behavior normal.      ED Treatments / Results  Labs (all labs ordered are listed, but only abnormal results are displayed) Labs Reviewed - No data to display  EKG None  Radiology No results found.  Procedures Procedures (including critical care time)  Medications Ordered in ED Medications - No data to display   Initial Impression / Assessment and Plan / ED Course  I have reviewed the triage vital signs and the nursing notes.  Pertinent labs & imaging results that were available during my care of  the patient were reviewed by me and considered in my medical decision making (see chart for details).        Patient with symptoms consistent with diabetic neuropathy.  Recommend tighter glucose control.  Will try short course of gabapentin.  DC to home with PCP follow-up.  Final Clinical Impressions(s) / ED Diagnoses   Final diagnoses:  Diabetic polyneuropathy associated with diabetes mellitus due to underlying condition [facility])    ED Discharge Orders         Ordered    gabapentin (NEURONTIN) 300 MG capsule  2 times daily     02/14/19 0421           Montine Circle, PA-C 02/14/19 0425    Palumbo, April, MD 02/14/19 [phone]

## 2019-02-14 NOTE — ED Triage Notes (Signed)
Pt BIB [facility] from. Pt c/o of bilateral large toe pain that began yesterday.

## 2019-02-14 NOTE — ED Notes (Signed)
Pt vomiting in room. ED PA notified.

## 2019-02-16 DIAGNOSIS — I509 Heart failure, unspecified: Secondary | ICD-10-CM | POA: Diagnosis not present

## 2019-02-16 DIAGNOSIS — I110 Hypertensive heart disease with heart failure: Secondary | ICD-10-CM | POA: Diagnosis not present

## 2019-02-16 DIAGNOSIS — Z20828 Contact with and (suspected) exposure to other viral communicable diseases: Secondary | ICD-10-CM | POA: Diagnosis not present

## 2019-03-19 ENCOUNTER — Encounter (HOSPITAL_COMMUNITY): Payer: Self-pay

## 2019-03-19 ENCOUNTER — Other Ambulatory Visit: Payer: Self-pay

## 2019-03-19 ENCOUNTER — Ambulatory Visit (HOSPITAL_COMMUNITY)
Admission: EM | Admit: 2019-03-19 | Discharge: 2019-03-19 | Disposition: A | Discharge status: Home / Self Care | Payer: Medicare Other | Attending: Family Medicine | Admitting: Family Medicine

## 2019-03-19 DIAGNOSIS — J029 Acute pharyngitis, unspecified: Secondary | ICD-10-CM | POA: Insufficient documentation

## 2019-03-19 DIAGNOSIS — Z20828 Contact with and (suspected) exposure to other viral communicable diseases: Secondary | ICD-10-CM | POA: Diagnosis not present

## 2019-03-19 DIAGNOSIS — I110 Hypertensive heart disease with heart failure: Secondary | ICD-10-CM | POA: Insufficient documentation

## 2019-03-19 DIAGNOSIS — I255 Ischemic cardiomyopathy: Secondary | ICD-10-CM | POA: Diagnosis not present

## 2019-03-19 DIAGNOSIS — Z7982 Long term (current) use of aspirin: Secondary | ICD-10-CM | POA: Insufficient documentation

## 2019-03-19 DIAGNOSIS — Z7901 Long term (current) use of anticoagulants: Secondary | ICD-10-CM | POA: Insufficient documentation

## 2019-03-19 DIAGNOSIS — Z95810 Presence of automatic (implantable) cardiac defibrillator: Secondary | ICD-10-CM | POA: Diagnosis not present

## 2019-03-19 DIAGNOSIS — E785 Hyperlipidemia, unspecified: Secondary | ICD-10-CM | POA: Insufficient documentation

## 2019-03-19 DIAGNOSIS — E1165 Type 2 diabetes mellitus with hyperglycemia: Secondary | ICD-10-CM | POA: Diagnosis not present

## 2019-03-19 DIAGNOSIS — I5022 Chronic systolic (congestive) heart failure: Secondary | ICD-10-CM | POA: Insufficient documentation

## 2019-03-19 DIAGNOSIS — R1110 Vomiting, unspecified: Secondary | ICD-10-CM | POA: Diagnosis not present

## 2019-03-19 DIAGNOSIS — Z79899 Other long term (current) drug therapy: Secondary | ICD-10-CM | POA: Diagnosis not present

## 2019-03-19 DIAGNOSIS — Z794 Long term (current) use of insulin: Secondary | ICD-10-CM | POA: Insufficient documentation

## 2019-03-19 DIAGNOSIS — Z8719 Personal history of other diseases of the digestive system: Secondary | ICD-10-CM | POA: Insufficient documentation

## 2019-03-19 LAB — POC SARS CORONAVIRUS 2 AG -  ED: SARS Coronavirus 2 Ag: NEGATIVE

## 2019-03-19 LAB — POC SARS CORONAVIRUS 2 AG: SARS Coronavirus 2 Ag: NEGATIVE

## 2019-03-19 MED ORDER — ONDANSETRON 4 MG PO TBDP
8.0000 mg | ORAL_TABLET | Freq: Once | ORAL | Status: AC
  Administered 2019-03-19: 8 mg via ORAL

## 2019-03-19 MED ORDER — ONDANSETRON 4 MG PO TBDP
ORAL_TABLET | ORAL | Status: AC
  Filled 2019-03-19: qty 2

## 2019-03-19 MED ORDER — ONDANSETRON 8 MG PO TBDP: 8.0000 mg | ORAL_TABLET | Freq: Three times a day (TID) | ORAL | 0 refills | Status: DC | PRN

## 2019-03-19 NOTE — ED Provider Notes (Signed)
[facility]    CSN: [id number] Arrival date & time: 03/19/19  1220      History   Chief Complaint Chief Complaint  Patient presents with  . Emesis    HPI Troy Rush is a 48 y.o. male.   HPI Started yesterday with nausea and vomiting No diarrhea or abdominal pain No body aches or headache He has sore throat and a little runny nose No known exposure COVID Works at hospital History of pancreatitis in the past Was in the hospital 2 weeks This does not feel anything like No known exposure to illness No foods that were suspicious Underlying poorly controlled DM, HTN, Heart disease, CHF Seen with ASL interpreter  Past Medical History:  Diagnosis Date  . AICD (automatic cardioverter/defibrillator) present 10/14/2016  . Bronchitis   . CHF (congestive heart failure) ([place])   . Depression   . Diabetes mellitus without complication ([place])   . Dyslipidemia   . Hypertension   . NICM (nonischemic cardiomyopathy) ([place]) 09/15/2016  . Pancreatitis     Patient Active Problem List   Diagnosis Date Noted  . Abdominal pain 11/14/2018  . Ischemic cardiomyopathy 10/14/2016  . NICM (nonischemic cardiomyopathy) ([place]) 09/15/2016  . Chronic systolic CHF (congestive heart failure) ([place]) 05/30/2016  . HTN (hypertension) 05/30/2016  . Deaf 05/30/2016  . Hyperkalemia 05/30/2016  . Diabetes ([place]) 03/21/2016  . Major depressive disorder, recurrent episode ([place]) 03/21/2016    Past Surgical History:  Procedure Laterality Date  . BACK SURGERY    . CARDIAC CATHETERIZATION    . ICD IMPLANT  10/14/2016  . ICD IMPLANT N/A 10/14/2016   Procedure: ICD Implant;  Surgeon: Deboraha Sprang, MD;  Location: [facility];  Service: Cardiovascular;  Laterality: N/A;  . WRIST SURGERY         Home Medications    Prior to Admission medications   Medication Sig Start Date End Date Taking? Authorizing Provider  Accu-Chek Softclix Lancets lancets USE FOUR TIMES DAILY AS DIRECTED  10/11/18   Lanae Boast, FNP  aspirin 81 MG chewable tablet Chew 81 mg by mouth daily.    [provider]  atorvastatin (LIPITOR) 80 MG tablet Take 1 tablet (80 mg total) by mouth daily. 07/13/18   Lanae Boast, FNP  blood glucose meter kit and supplies KIT Dispense based on patient and insurance preference. Use up to four times daily as directed. (FOR ICD-9 250.00, 250.01). 03/21/16   Micheline Chapman, NP  Blood Glucose Monitoring Suppl (ACCU-CHEK AVIVA PLUS) w/Device KIT 1 each by Does not apply route 4 (four) times daily -  before meals and at bedtime. E.11.8 07/13/18   Lanae Boast, FNP  canagliflozin [facility]) 300 MG TABS tablet Take 1 tablet (300 mg total) by mouth daily before breakfast. 07/13/18   Lanae Boast, FNP  carvedilol (COREG) 12.5 MG tablet Take 1.5 tablets (18.75 mg total) by mouth 2 (two) times daily. 08/17/18 08/12/19  Larey Dresser, MD  citalopram (CELEXA) 40 MG tablet Take 1 tablet (40 mg total) by mouth daily. 11/16/18   Swayze, Ava, DO  digoxin (LANOXIN) 0.125 MG tablet Take 1 tablet (0.125 mg total) by mouth daily. 08/27/18   Georgiana Shore, NP  furosemide (LASIX) 80 MG tablet TAKE 1 TABLET(80 MG) BY MOUTH TWICE DAILY 01/28/19   Larey Dresser, MD  gabapentin (NEURONTIN) 300 MG capsule Take 1 capsule (300 mg total) by mouth 2 (two) times daily. 02/14/19   Montine Circle, PA-C  glucose blood (ACCU-CHEK  AVIVA PLUS) test strip Check blood three times per day and at bedtime 05/09/17   Scot Jun, FNP  hydrALAZINE (APRESOLINE) 25 MG tablet Take 1.5 tablets (37.5 mg total) by mouth 3 (three) times daily. 11/09/18 02/07/19  Darrick Grinder D, NP  Insulin Glargine (LANTUS SOLOSTAR) 100 UNIT/ML Solostar Pen Inject 40 Units into the skin daily at 10 pm. 07/13/18   Lanae Boast, FNP  isosorbide mononitrate (IMDUR) 30 MG 24 hr tablet Take 1 tablet (30 mg total) by mouth daily. 10/08/18 10/03/19  Darrick Grinder D, NP  Lancet Devices [facility]) lancets Use as instructed  10/17/16   Dorena Dew, FNP  metFORMIN (GLUCOPHAGE) 1000 MG tablet Take 1 tablet (1,000 mg total) by mouth 2 (two) times daily with a meal. 07/13/18   Lanae Boast, FNP  ondansetron (ZOFRAN ODT) 8 MG disintegrating tablet Take 1 tablet (8 mg total) by mouth every 8 (eight) hours as needed for nausea or vomiting. 03/19/19   Raylene Everts, MD  promethazine (PHENERGAN) 25 MG tablet Take 1 tablet (25 mg total) by mouth every 6 (six) hours as needed for nausea or vomiting. Patient not taking: Reported on 10/08/2018 02/02/16   Shary Decamp, PA-C  sacubitril-valsartan (ENTRESTO) 49-51 MG Take 1 tablet by mouth 2 (two) times daily. 01/24/19   Larey Dresser, MD    Family History Family History  Problem Relation Age of Onset  . Hypertension Mother   . Healthy Father   . Stroke Father     Social History Social History   Tobacco Use  . Smoking status: Never Smoker  . Smokeless tobacco: Never Used  Substance Use Topics  . Alcohol use: Yes    Comment: rare  . Drug use: No     Allergies   Lisinopril   Review of Systems Review of Systems  Constitutional: Negative for chills and fever.  HENT: Positive for rhinorrhea and sore throat. Negative for ear pain.   Eyes: Negative for pain and visual disturbance.  Respiratory: Negative for cough and shortness of breath.   Cardiovascular: Negative for chest pain and palpitations.  Gastrointestinal: Positive for nausea and vomiting. Negative for abdominal pain.  Genitourinary: Negative for dysuria and hematuria.  Musculoskeletal: Negative for arthralgias, back pain and myalgias.  Skin: Negative for color change and rash.  Neurological: Negative for seizures, syncope and headaches.  All other systems reviewed and are negative.    Physical Exam Triage Vital Signs ED Triage Vitals  Enc Vitals Group     BP 03/19/19 1420 (!) 173/105     Pulse Rate 03/19/19 1420 92     Resp 03/19/19 1420 18     Temp 03/19/19 1420 99 F (37.2 C)      Temp src --      SpO2 03/19/19 1420 97 %     Weight --      Height --      Head Circumference --      Peak Flow --      Pain Score 03/19/19 1416 3     Pain Loc --      Pain Edu? --      Excl. in [place]? --    No data found.  Updated Vital Signs BP (!) 173/105   Pulse 92   Temp 99 F (37.2 C)   Resp 18   SpO2 97%        Physical Exam Constitutional:      General: He is not in acute distress.  Appearance: He is well-developed.     Comments: pleasant  HENT:     Head: Normocephalic and atraumatic.     Right Ear: Tympanic membrane, ear canal and external ear normal.     Left Ear: Tympanic membrane, ear canal and external ear normal.     Nose: Nose normal.     Mouth/Throat:     Mouth: Mucous membranes are moist.     Pharynx: Oropharynx is clear. No posterior oropharyngeal erythema.  Eyes:     Conjunctiva/sclera: Conjunctivae normal.     Pupils: Pupils are equal, round, and reactive to light.  Neck:     Musculoskeletal: Normal range of motion.  Cardiovascular:     Rate and Rhythm: Normal rate and regular rhythm.     Heart sounds: Normal heart sounds.  Pulmonary:     Effort: Pulmonary effort is normal. No respiratory distress.     Breath sounds: Normal breath sounds.  Abdominal:     General: Abdomen is flat. There is no distension.     Palpations: Abdomen is soft.     Tenderness: There is no abdominal tenderness.  Musculoskeletal: Normal range of motion.  Skin:    General: Skin is warm and dry.  Neurological:     Mental Status: He is alert.  Psychiatric:        Mood and Affect: Mood normal.        Behavior: Behavior normal.      UC Treatments / Results  Labs (all labs ordered are listed, but only abnormal results are displayed) Labs Reviewed  POC SARS CORONAVIRUS 2 AG -  ED  POC SARS CORONAVIRUS 2 AG   Rapid COVID negative Confirmation sent EKG   Radiology No results found.  Procedures Procedures (including critical care time)  Medications  Ordered in UC Medications  ondansetron (ZOFRAN-ODT) disintegrating tablet 8 mg (8 mg Oral Given 03/19/19 1428)  ondansetron (ZOFRAN-ODT) 4 MG disintegrating tablet (has no administration in time range)    Initial Impression / Assessment and Plan / UC Course  I have reviewed the triage vital signs and the nursing notes.  Pertinent labs & imaging results that were available during my care of the patient were reviewed by me and considered in my medical decision making (see chart for details).     Discussed with patient Health at Work notified Final Clinical Impressions(s) / UC Diagnoses   Final diagnoses:  Intractable vomiting, presence of nausea not specified, unspecified vomiting type     Discharge Instructions     Drink clear liquids/water Advance to bland diet as tolerated Quarantine at home until the coronavirus test is back Contact health at work    ED Prescriptions    Medication [place]. Provider   ondansetron (ZOFRAN ODT) 8 MG disintegrating tablet Take 1 tablet (8 mg total) by mouth every 8 (eight) hours as needed for nausea or vomiting. 20 tablet Raylene Everts, MD     PDMP not reviewed this encounter.   Raylene Everts, MD 03/19/19 [phone]

## 2019-03-19 NOTE — ED Triage Notes (Signed)
Pt presents with complaints of emesis and sore throat that started yesterday   Pt reports generalized abdominal pain and hot flashes throughout the day. The patient denies any fevers at home. Reports having to leave work early today for feeling bad.   Pt works in the hospital setting.

## 2019-03-19 NOTE — Discharge Instructions (Signed)
Drink clear liquids/water Advance to bland diet as tolerated Quarantine at home until the coronavirus test is back Contact health at work

## 2019-03-19 NOTE — ED Notes (Signed)
Pt needs asl Optometrist.

## 2019-03-21 LAB — NOVEL CORONAVIRUS, NAA (HOSP ORDER, SEND-OUT TO REF LAB; TAT 18-24 HRS): SARS-CoV-2, NAA: NOT DETECTED

## 2019-03-25 ENCOUNTER — Ambulatory Visit (INDEPENDENT_AMBULATORY_CARE_PROVIDER_SITE_OTHER): Payer: Medicare Other | Admitting: *Deleted

## 2019-03-25 DIAGNOSIS — I5022 Chronic systolic (congestive) heart failure: Secondary | ICD-10-CM

## 2019-03-25 LAB — CUP PACEART REMOTE DEVICE CHECK
Date Time Interrogation Session: 20201214151913
Implantable Lead Implant Date: 20180706
Implantable Lead Location: 753860
Implantable Lead Model: 293
Implantable Lead Serial Number: 432676
Implantable Pulse Generator Implant Date: 20180706
Pulse Gen Serial Number: 236570

## 2019-04-27 NOTE — Progress Notes (Signed)
ICD remote 

## 2019-06-24 ENCOUNTER — Ambulatory Visit (INDEPENDENT_AMBULATORY_CARE_PROVIDER_SITE_OTHER): Payer: Medicare Other | Admitting: *Deleted

## 2019-06-24 DIAGNOSIS — I5022 Chronic systolic (congestive) heart failure: Secondary | ICD-10-CM | POA: Diagnosis not present

## 2019-06-24 LAB — CUP PACEART REMOTE DEVICE CHECK
Battery Remaining Longevity: 168 mo
Battery Remaining Longevity: 168 mo
Battery Remaining Percentage: 100 %
Battery Remaining Percentage: 100 %
Brady Statistic RV Percent Paced: 0 %
Brady Statistic RV Percent Paced: 0 %
Date Time Interrogation Session: 20210315004100
Date Time Interrogation Session: 20210315004100
HighPow Impedance: 59 Ohm
HighPow Impedance: 59 Ohm
Implantable Lead Implant Date: 20180706
Implantable Lead Implant Date: 20180706
Implantable Lead Location: 753860
Implantable Lead Location: 753860
Implantable Lead Model: 293
Implantable Lead Model: 293
Implantable Lead Serial Number: 432676
Implantable Lead Serial Number: 432676
Implantable Pulse Generator Implant Date: 20180706
Implantable Pulse Generator Implant Date: 20180706
Lead Channel Impedance Value: 437 Ohm
Lead Channel Impedance Value: 437 Ohm
Lead Channel Setting Pacing Amplitude: 2.5 V
Lead Channel Setting Pacing Amplitude: 2.5 V
Lead Channel Setting Pacing Pulse Width: 0.4 ms
Lead Channel Setting Pacing Pulse Width: 0.4 ms
Lead Channel Setting Sensing Sensitivity: 0.5 mV
Lead Channel Setting Sensing Sensitivity: 0.5 mV
Pulse Gen Serial Number: 236570
Pulse Gen Serial Number: 236570

## 2019-06-24 NOTE — Progress Notes (Signed)
ICD Remote  

## 2019-07-12 ENCOUNTER — Emergency Department (HOSPITAL_COMMUNITY)
Admission: EM | Admit: 2019-07-12 | Discharge: 2019-07-12 | Disposition: A | Discharge status: Home / Self Care | Payer: Medicare Other | Attending: Emergency Medicine | Admitting: Emergency Medicine

## 2019-07-12 ENCOUNTER — Emergency Department (HOSPITAL_BASED_OUTPATIENT_CLINIC_OR_DEPARTMENT_OTHER)
Admit: 2019-07-12 | Discharge: 2019-07-12 | Disposition: A | Discharge status: Home / Self Care | Payer: Medicare Other | Attending: Emergency Medicine | Admitting: Emergency Medicine

## 2019-07-12 ENCOUNTER — Other Ambulatory Visit: Payer: Self-pay

## 2019-07-12 ENCOUNTER — Emergency Department (HOSPITAL_COMMUNITY): Payer: Medicare Other

## 2019-07-12 ENCOUNTER — Encounter (HOSPITAL_COMMUNITY): Payer: Self-pay | Admitting: Emergency Medicine

## 2019-07-12 DIAGNOSIS — I110 Hypertensive heart disease with heart failure: Secondary | ICD-10-CM | POA: Insufficient documentation

## 2019-07-12 DIAGNOSIS — I82409 Acute embolism and thrombosis of unspecified deep veins of unspecified lower extremity: Secondary | ICD-10-CM

## 2019-07-12 DIAGNOSIS — Z79899 Other long term (current) drug therapy: Secondary | ICD-10-CM | POA: Diagnosis not present

## 2019-07-12 DIAGNOSIS — N179 Acute kidney failure, unspecified: Secondary | ICD-10-CM | POA: Diagnosis not present

## 2019-07-12 DIAGNOSIS — I5022 Chronic systolic (congestive) heart failure: Secondary | ICD-10-CM | POA: Diagnosis not present

## 2019-07-12 DIAGNOSIS — Z95810 Presence of automatic (implantable) cardiac defibrillator: Secondary | ICD-10-CM | POA: Insufficient documentation

## 2019-07-12 DIAGNOSIS — I2699 Other pulmonary embolism without acute cor pulmonale: Secondary | ICD-10-CM | POA: Insufficient documentation

## 2019-07-12 DIAGNOSIS — I82461 Acute embolism and thrombosis of right calf muscular vein: Secondary | ICD-10-CM | POA: Insufficient documentation

## 2019-07-12 DIAGNOSIS — R600 Localized edema: Secondary | ICD-10-CM | POA: Diagnosis present

## 2019-07-12 DIAGNOSIS — Z794 Long term (current) use of insulin: Secondary | ICD-10-CM | POA: Insufficient documentation

## 2019-07-12 DIAGNOSIS — E119 Type 2 diabetes mellitus without complications: Secondary | ICD-10-CM | POA: Diagnosis not present

## 2019-07-12 DIAGNOSIS — M7989 Other specified soft tissue disorders: Secondary | ICD-10-CM

## 2019-07-12 DIAGNOSIS — R0602 Shortness of breath: Secondary | ICD-10-CM | POA: Insufficient documentation

## 2019-07-12 HISTORY — DX: Acute embolism and thrombosis of unspecified deep veins of unspecified lower extremity: I82.409

## 2019-07-12 LAB — CBC WITH DIFFERENTIAL/PLATELET
Abs Immature Granulocytes: 0.01 10*3/uL (ref 0.00–0.07)
Basophils Absolute: 0 10*3/uL (ref 0.0–0.1)
Basophils Relative: 0 %
Eosinophils Absolute: 0.1 10*3/uL (ref 0.0–0.5)
Eosinophils Relative: 2 %
HCT: 39.8 % (ref 39.0–52.0)
Hemoglobin: 12 g/dL — ABNORMAL LOW (ref 13.0–17.0)
Immature Granulocytes: 0 %
Lymphocytes Relative: 33 %
Lymphs Abs: 1.2 10*3/uL (ref 0.7–4.0)
MCH: 28.2 pg (ref 26.0–34.0)
MCHC: 30.2 g/dL (ref 30.0–36.0)
MCV: 93.6 fL (ref 80.0–100.0)
Monocytes Absolute: 0.4 10*3/uL (ref 0.1–1.0)
Monocytes Relative: 12 %
Neutro Abs: 1.9 10*3/uL (ref 1.7–7.7)
Neutrophils Relative %: 53 %
Platelets: 176 10*3/uL (ref 150–400)
RBC: 4.25 MIL/uL (ref 4.22–5.81)
RDW: 13.5 % (ref 11.5–15.5)
WBC: 3.5 10*3/uL — ABNORMAL LOW (ref 4.0–10.5)
nRBC: 0 % (ref 0.0–0.2)

## 2019-07-12 LAB — COMPREHENSIVE METABOLIC PANEL
ALT: 31 U/L (ref 0–44)
AST: 54 U/L — ABNORMAL HIGH (ref 15–41)
Albumin: 3.6 g/dL (ref 3.5–5.0)
Alkaline Phosphatase: 54 U/L (ref 38–126)
Anion gap: 12 (ref 5–15)
BUN: 23 mg/dL — ABNORMAL HIGH (ref 6–20)
CO2: 29 mmol/L (ref 22–32)
Calcium: 8.7 mg/dL — ABNORMAL LOW (ref 8.9–10.3)
Chloride: 100 mmol/L (ref 98–111)
Creatinine, Ser: 1.85 mg/dL — ABNORMAL HIGH (ref 0.61–1.24)
GFR calc Af Amer: 48 mL/min — ABNORMAL LOW (ref >60)
GFR calc non Af Amer: 42 mL/min — ABNORMAL LOW (ref >60)
Glucose, Bld: 146 mg/dL — ABNORMAL HIGH (ref 70–99)
Potassium: 5.1 mmol/L (ref 3.5–5.1)
Sodium: 141 mmol/L (ref 135–145)
Total Bilirubin: 1.1 mg/dL (ref 0.3–1.2)
Total Protein: 7.2 g/dL (ref 6.5–8.1)

## 2019-07-12 LAB — BRAIN NATRIURETIC PEPTIDE: B Natriuretic Peptide: 247.7 pg/mL — ABNORMAL HIGH (ref 0.0–100.0)

## 2019-07-12 MED ORDER — FUROSEMIDE 10 MG/ML IJ SOLN
60.0000 mg | Freq: Once | INTRAMUSCULAR | Status: AC
  Administered 2019-07-12: 14:00:00 60 mg via INTRAVENOUS
  Filled 2019-07-12: qty 6

## 2019-07-12 MED ORDER — APIXABAN (ELIQUIS) VTE STARTER PACK (10MG AND 5MG): ORAL_TABLET | ORAL | 0 refills | Status: DC

## 2019-07-12 MED ORDER — IOHEXOL 350 MG/ML SOLN
100.0000 mL | Freq: Once | INTRAVENOUS | Status: AC | PRN
  Administered 2019-07-12: 100 mL via INTRAVENOUS

## 2019-07-12 MED ORDER — APIXABAN 5 MG PO TABS
10.0000 mg | ORAL_TABLET | Freq: Once | ORAL | Status: AC
  Administered 2019-07-12: 10 mg via ORAL
  Filled 2019-07-12: qty 2

## 2019-07-12 NOTE — Progress Notes (Signed)
Right lower extremity venous duplex has been completed. Preliminary results can be found in CV Proc through chart review.  Results were given to Dr. Ralene Bathe.  07/12/19 4:53 PM Troy Rush RVT

## 2019-07-12 NOTE — Social Work (Signed)
EDCSW met with Pt at bedside. With the assistance of the green box interpreter CSW was able to ascertain that Pt does have a PCP here in [place]. Pt states that he will be following up with PCP after he is discharged.

## 2019-07-12 NOTE — ED Provider Notes (Signed)
Patient care assumed at 1630.  Pt here for evaluation of lower extremity edema.  He has mild CHF, diuresing after one dose of lasix in ED.  RLE vascular US pending to r/o DVT.    Vascular US is positive for acute DVT of right gastrocnemius.  Given patient's associated SOB will obtain CTA to r/o associated PE.    CTA demonstrates small sub segmental pulmonary embolism. Patient is low risk for complications from PE based off of PESI score. Discussed with patient findings of studies. Will start eloquence due to his renal insufficiency. Discussed with patient home care for DVT, PE as well as bleeding precautions. Importance of outpatient follow-up also discussed.   Quintella Reichert, MD 07/12/19 [phone]

## 2019-07-12 NOTE — ED Triage Notes (Signed)
Leg pain and foot pain and numbness x 2 days   Back of calf hurts  Pt is deaf does do sign language

## 2019-07-12 NOTE — Discharge Instructions (Signed)
Information on my medicine - ELIQUIS (apixaban)  Why was Eliquis prescribed for you? Eliquis was prescribed to treat blood clots that may have been found in the veins of your legs (deep vein thrombosis) or in your lungs (pulmonary embolism) and to reduce the risk of them occurring again.  What do You need to know about Eliquis ? The starting dose is 10 mg (two 5 mg tablets) taken TWICE daily for the FIRST SEVEN (7) DAYS, then on 07/20/2019  the dose is reduced to ONE 5 mg tablet taken TWICE daily.  Eliquis may be taken with or without food.   Try to take the dose about the same time in the morning and in the evening. If you have difficulty swallowing the tablet whole please discuss with your pharmacist how to take the medication safely.  Take Eliquis exactly as prescribed and DO NOT stop taking Eliquis without talking to the doctor who prescribed the medication.  Stopping may increase your risk of developing a new blood clot.  Refill your prescription before you run out.  After discharge, you should have regular check-up appointments with your healthcare provider that is prescribing your Eliquis.    What do you do if you miss a dose? If a dose of ELIQUIS is not taken at the scheduled time, take it as soon as possible on the same day and twice-daily administration should be resumed. The dose should not be doubled to make up for a missed dose.  Important Safety Information A possible side effect of Eliquis is bleeding. You should call your healthcare provider right away if you experience any of the following: ? Bleeding from an injury or your nose that does not stop. ? Unusual colored urine (red or dark brown) or unusual colored stools (red or black). ? Unusual bruising for unknown reasons. ? A serious fall or if you hit your head (even if there is no bleeding).  Some medicines may interact with Eliquis and might increase your risk of bleeding or clotting while on Eliquis. To help  avoid this, consult your healthcare provider or pharmacist prior to using any new prescription or non-prescription medications, including herbals, vitamins, non-steroidal anti-inflammatory drugs (NSAIDs) and supplements.  This website has more information on Eliquis (apixaban): http://www.eliquis.com/eliquis/home

## 2019-07-12 NOTE — ED Provider Notes (Addendum)
[facility] EMERGENCY DEPARTMENT Provider Note   CSN: [id number] Arrival date & time: 07/12/19  1106     History Chief Complaint  Patient presents with  . Leg Pain    Troy Rush is a 49 y.o. male.  Patient is a 49 year old male who has a history of CHF.  This is nonischemic and he had an echo in 2016 that showed an EF of 15 to 20%.  He presents with leg swelling and tightness.  He has a history of this in the past.  He says when he gets fluid overloaded his legs get tight and swollen which is similar to what is going on now.  He says is been going on for about 2 to 3 days.  He says both of them have been swollen.  He has a little bit of shortness of breath.  No associated chest pain.  He has been taking his diuretics without missing any doses.  No recent changes in medication.  No fevers.  History was obtained using sign language interpreter.        Past Medical History:  Diagnosis Date  . AICD (automatic cardioverter/defibrillator) present 10/14/2016  . Bronchitis   . CHF (congestive heart failure) ([place])   . Depression   . Diabetes mellitus without complication ([place])   . Dyslipidemia   . Hypertension   . NICM (nonischemic cardiomyopathy) ([place]) 09/15/2016  . Pancreatitis     Patient Active Problem List   Diagnosis Date Noted  . Abdominal pain 11/14/2018  . Ischemic cardiomyopathy 10/14/2016  . NICM (nonischemic cardiomyopathy) ([place]) 09/15/2016  . Chronic systolic CHF (congestive heart failure) ([place]) 05/30/2016  . HTN (hypertension) 05/30/2016  . Deaf 05/30/2016  . Hyperkalemia 05/30/2016  . Diabetes ([place]) 03/21/2016  . Major depressive disorder, recurrent episode ([place]) 03/21/2016    Past Surgical History:  Procedure Laterality Date  . BACK SURGERY    . CARDIAC CATHETERIZATION    . ICD IMPLANT  10/14/2016  . ICD IMPLANT N/A 10/14/2016   Procedure: ICD Implant;  Surgeon: Deboraha Sprang, MD;  Location: [facility];  Service: Cardiovascular;   Laterality: N/A;  . WRIST SURGERY         Family History  Problem Relation Age of Onset  . Hypertension Mother   . Healthy Father   . Stroke Father     Social History   Tobacco Use  . Smoking status: Never Smoker  . Smokeless tobacco: Never Used  Substance Use Topics  . Alcohol use: Yes    Comment: rare  . Drug use: No    Home Medications Prior to Admission medications   Medication Sig Start Date End Date Taking? Authorizing Provider  aspirin 81 MG chewable tablet Chew 81 mg by mouth daily.   Yes [provider]  atorvastatin (LIPITOR) 80 MG tablet Take 1 tablet (80 mg total) by mouth daily. 07/13/18  Yes Lanae Boast, FNP  canagliflozin [facility]) 300 MG TABS tablet Take 1 tablet (300 mg total) by mouth daily before breakfast. 07/13/18  Yes Lanae Boast, FNP  carvedilol (COREG) 12.5 MG tablet Take 1.5 tablets (18.75 mg total) by mouth 2 (two) times daily. 08/17/18 08/12/19 Yes Larey Dresser, MD  citalopram (CELEXA) 40 MG tablet Take 1 tablet (40 mg total) by mouth daily. 11/16/18  Yes Swayze, Ava, DO  furosemide (LASIX) 80 MG tablet TAKE 1 TABLET(80 MG) BY MOUTH TWICE DAILY Patient taking differently: Take 80 mg by mouth 2 (two) times daily.  01/28/19  Yes Larey Dresser, MD  gabapentin (NEURONTIN) 300 MG capsule Take 1 capsule (300 mg total) by mouth 2 (two) times daily. 02/14/19  Yes Montine Circle, PA-C  hydrALAZINE (APRESOLINE) 25 MG tablet Take 1.5 tablets (37.5 mg total) by mouth 3 (three) times daily. 11/09/18 07/12/19 Yes Clegg, Amy D, NP  Insulin Glargine (LANTUS SOLOSTAR) 100 UNIT/ML Solostar Pen Inject 40 Units into the skin daily at 10 pm. Patient taking differently: Inject 35 Units into the skin daily at 10 pm.  07/13/18  Yes Lanae Boast, FNP  isosorbide mononitrate (IMDUR) 30 MG 24 hr tablet Take 1 tablet (30 mg total) by mouth daily. 10/08/18 10/03/19 Yes Clegg, Amy D, NP  metFORMIN (GLUCOPHAGE) 1000 MG tablet Take 1 tablet (1,000 mg total) by mouth 2 (two)  times daily with a meal. 07/13/18  Yes Lanae Boast, FNP  sacubitril-valsartan (ENTRESTO) 24-26 MG Take 1 tablet by mouth 2 (two) times daily.   Yes [provider]  Accu-Chek Softclix Lancets lancets USE FOUR TIMES DAILY AS DIRECTED 10/11/18   Lanae Boast, FNP  blood glucose meter kit and supplies KIT Dispense based on patient and insurance preference. Use up to four times daily as directed. (FOR ICD-9 250.00, 250.01). 03/21/16   Micheline Chapman, NP  Blood Glucose Monitoring Suppl (ACCU-CHEK AVIVA PLUS) w/Device KIT 1 each by Does not apply route 4 (four) times daily -  before meals and at bedtime. E.11.8 07/13/18   Lanae Boast, FNP  digoxin (LANOXIN) 0.125 MG tablet Take 1 tablet (0.125 mg total) by mouth daily. Patient not taking: Reported on 07/12/2019 08/27/18   Georgiana Shore, NP  glucose blood (ACCU-CHEK AVIVA PLUS) test strip Check blood three times per day and at bedtime 05/09/17   Scot Jun, FNP  Lancet Devices [facility]) lancets Use as instructed 10/17/16   Dorena Dew, FNP  ondansetron (ZOFRAN ODT) 8 MG disintegrating tablet Take 1 tablet (8 mg total) by mouth every 8 (eight) hours as needed for nausea or vomiting. Patient not taking: Reported on 07/12/2019 03/19/19   Raylene Everts, MD  promethazine (PHENERGAN) 25 MG tablet Take 1 tablet (25 mg total) by mouth every 6 (six) hours as needed for nausea or vomiting. Patient not taking: Reported on 10/08/2018 02/02/16   Shary Decamp, PA-C  sacubitril-valsartan (ENTRESTO) 49-51 MG Take 1 tablet by mouth 2 (two) times daily. Patient not taking: Reported on 07/12/2019 01/24/19   Larey Dresser, MD    Allergies    Lisinopril  Review of Systems   Review of Systems  Constitutional: Negative for chills, diaphoresis, fatigue and fever.  HENT: Negative for congestion, rhinorrhea and sneezing.   Eyes: Negative.   Respiratory: Positive for shortness of breath. Negative for cough and chest tightness.    Cardiovascular: Positive for leg swelling. Negative for chest pain.  Gastrointestinal: Negative for abdominal pain, blood in stool, diarrhea, nausea and vomiting.  Genitourinary: Negative for difficulty urinating, flank pain, frequency and hematuria.  Musculoskeletal: Negative for arthralgias and back pain.  Skin: Negative for rash.  Neurological: Negative for dizziness, speech difficulty, weakness, numbness and headaches.    Physical Exam Updated Vital Signs BP (!) 183/111 (BP Location: Right Arm)   Pulse 81   Temp 98.3 F (36.8 C) (Oral)   Resp 18   SpO2 100%   Physical Exam Constitutional:      Appearance: He is well-developed.  HENT:     Head: Normocephalic and atraumatic.  Eyes:  Pupils: Pupils are equal, round, and reactive to light.  Cardiovascular:     Rate and Rhythm: Normal rate and regular rhythm.     Heart sounds: Normal heart sounds.  Pulmonary:     Effort: Pulmonary effort is normal. No respiratory distress.     Breath sounds: Normal breath sounds. No wheezing or rales.  Chest:     Chest wall: No tenderness.  Abdominal:     General: Bowel sounds are normal.     Palpations: Abdomen is soft.     Tenderness: There is no abdominal tenderness. There is no guarding or rebound.  Musculoskeletal:        General: Normal range of motion.     Cervical back: Normal range of motion and neck supple.     Comments: 2+ pitting edema to the bilateral lower extremities, pedal pulses are intact, there is no suggestions of cellulitis.  Lymphadenopathy:     Cervical: No cervical adenopathy.  Skin:    General: Skin is warm and dry.     Findings: No rash.  Neurological:     Mental Status: He is alert and oriented to person, place, and time.     ED Results / Procedures / Treatments   Labs (all labs ordered are listed, but only abnormal results are displayed) Labs Reviewed  COMPREHENSIVE METABOLIC PANEL - Abnormal; Notable for the following components:      Result  Value   Glucose, Bld 146 (*)    BUN 23 (*)    Creatinine, Ser 1.85 (*)    Calcium 8.7 (*)    AST 54 (*)    GFR calc non Af Amer 42 (*)    GFR calc Af Amer 48 (*)    All other components within normal limits  CBC WITH DIFFERENTIAL/PLATELET - Abnormal; Notable for the following components:   WBC 3.5 (*)    Hemoglobin 12.0 (*)    All other components within normal limits  BRAIN NATRIURETIC PEPTIDE - Abnormal; Notable for the following components:   B Natriuretic Peptide 247.7 (*)    All other components within normal limits    EKG None  Radiology DG Chest 2 View  Result Date: 07/12/2019 CLINICAL DATA:  Shortness of breath.  History of cardiac arrhythmia EXAM: CHEST - 2 VIEW COMPARISON:  January 16, 2019 FINDINGS: The lungs are clear. Heart is mildly enlarged, stable, with pacemaker lead tip attached to the right ventricle. No pneumothorax. No adenopathy. There is degenerative change in the thoracic spine. IMPRESSION: Stable cardiac prominence. Lungs clear. Pacemaker lead attached to right ventricle. Electronically Signed   By: Lowella Grip III M.D.   On: 07/12/2019 13:00    Procedures Procedures (including critical care time)  Medications Ordered in ED Medications  furosemide (LASIX) injection 60 mg (60 mg Intravenous Given 07/12/19 1330)    ED Course  I have reviewed the triage vital signs and the nursing notes.  Pertinent labs & imaging results that were available during my care of the patient were reviewed by me and considered in my medical decision making (see chart for details).    MDM Rules/Calculators/A&P                      Patient is a 49 year old who presents with leg swelling and some mild shortness of breath.  The symptoms seem consistent with a mild CHF exacerbation.  His BNP is mildly elevated.  His other labs are nonconcerning.  He has no evidence of pulmonary  edema on chest x-ray.  He was given Lasix in the ED and he feels better although his right leg pain  seems to be persistent and he says it feels tight and slightly different than his typical CHF exacerbation.  I will order a Doppler ultrasound to rule out DVT.  If this is normal, he can be discharged to follow-up with his cardiologist.  Dr. Ralene Bathe to take over care.  Final Clinical Impression(s) / ED Diagnoses Final diagnoses:  Leg edema    Rx / DC Orders ED Discharge Orders    None       Malvin Johns, MD 07/12/19 1532    Malvin Johns, MD 07/12/19 1627

## 2019-07-12 NOTE — ED Notes (Signed)
Using ASL interpreter pt expressed understanding of d/c instructions, medications, follow up care and s/s requiring return to ED. Pt had no additional questions at this time. Transported to exit via wheelchair.

## 2019-07-12 NOTE — ED Notes (Signed)
Pt returned from xray

## 2019-07-13 ENCOUNTER — Encounter: Payer: Self-pay | Admitting: Family Medicine

## 2019-07-24 ENCOUNTER — Telehealth: Payer: Self-pay | Admitting: Family Medicine

## 2019-07-24 NOTE — Telephone Encounter (Signed)
Pt was called message was left to call the office to reschedule appointment for 07/30/19

## 2019-07-30 ENCOUNTER — Ambulatory Visit: Payer: Medicare Other | Admitting: Family Medicine

## 2019-08-10 DIAGNOSIS — G4700 Insomnia, unspecified: Secondary | ICD-10-CM

## 2019-08-10 DIAGNOSIS — R519 Headache, unspecified: Secondary | ICD-10-CM

## 2019-08-10 DIAGNOSIS — E559 Vitamin D deficiency, unspecified: Secondary | ICD-10-CM

## 2019-08-10 HISTORY — DX: Vitamin D deficiency, unspecified: E55.9

## 2019-08-10 HISTORY — DX: Insomnia, unspecified: G47.00

## 2019-08-10 HISTORY — DX: Headache, unspecified: R51.9

## 2019-08-12 ENCOUNTER — Other Ambulatory Visit (HOSPITAL_COMMUNITY): Payer: Self-pay

## 2019-08-12 MED ORDER — FUROSEMIDE 80 MG PO TABS: ORAL_TABLET | ORAL | 0 refills | Status: DC

## 2019-08-13 ENCOUNTER — Other Ambulatory Visit: Payer: Self-pay

## 2019-08-13 ENCOUNTER — Encounter: Payer: Self-pay | Admitting: Family Medicine

## 2019-08-13 ENCOUNTER — Ambulatory Visit (INDEPENDENT_AMBULATORY_CARE_PROVIDER_SITE_OTHER): Payer: Medicare Other | Admitting: Family Medicine

## 2019-08-13 VITALS — BP 160/93 | HR 70 | Temp 98.3°F | Ht 69.0 in | Wt 231.0 lb

## 2019-08-13 DIAGNOSIS — Z09 Encounter for follow-up examination after completed treatment for conditions other than malignant neoplasm: Secondary | ICD-10-CM | POA: Diagnosis not present

## 2019-08-13 DIAGNOSIS — G4700 Insomnia, unspecified: Secondary | ICD-10-CM | POA: Diagnosis not present

## 2019-08-13 DIAGNOSIS — R7303 Prediabetes: Secondary | ICD-10-CM | POA: Diagnosis not present

## 2019-08-13 DIAGNOSIS — H9193 Unspecified hearing loss, bilateral: Secondary | ICD-10-CM | POA: Diagnosis not present

## 2019-08-13 DIAGNOSIS — E559 Vitamin D deficiency, unspecified: Secondary | ICD-10-CM | POA: Diagnosis not present

## 2019-08-13 DIAGNOSIS — E119 Type 2 diabetes mellitus without complications: Secondary | ICD-10-CM

## 2019-08-13 DIAGNOSIS — Z0000 Encounter for general adult medical examination without abnormal findings: Secondary | ICD-10-CM | POA: Diagnosis not present

## 2019-08-13 DIAGNOSIS — E538 Deficiency of other specified B group vitamins: Secondary | ICD-10-CM | POA: Diagnosis not present

## 2019-08-13 DIAGNOSIS — Z8639 Personal history of other endocrine, nutritional and metabolic disease: Secondary | ICD-10-CM

## 2019-08-13 DIAGNOSIS — I10 Essential (primary) hypertension: Secondary | ICD-10-CM | POA: Diagnosis not present

## 2019-08-13 DIAGNOSIS — E118 Type 2 diabetes mellitus with unspecified complications: Secondary | ICD-10-CM

## 2019-08-13 DIAGNOSIS — Z794 Long term (current) use of insulin: Secondary | ICD-10-CM

## 2019-08-13 LAB — POCT GLYCOSYLATED HEMOGLOBIN (HGB A1C): Hemoglobin A1C: 9 % — AB (ref 4.0–5.6)

## 2019-08-13 LAB — POCT URINALYSIS DIPSTICK
Bilirubin, UA: NEGATIVE
Glucose, UA: POSITIVE — AB
Ketones, UA: NEGATIVE
Leukocytes, UA: NEGATIVE
Nitrite, UA: NEGATIVE
Protein, UA: POSITIVE — AB
Spec Grav, UA: >=1.03 — AB (ref 1.010–1.025)
Urobilinogen, UA: 0.2 E.U./dL
pH, UA: 5.5 (ref 5.0–8.0)

## 2019-08-13 MED ORDER — TRAZODONE HCL 100 MG PO TABS: 100.0000 mg | ORAL_TABLET | Freq: Every day | ORAL | 3 refills | Status: DC

## 2019-08-13 NOTE — Progress Notes (Signed)
Patient [facility]   Re-Establish Care--Hospital Follow Up  Subjective:  Patient ID: Troy Rush, male    DOB: 1970-07-30  Age: 49 y.o. MRN: [id number]  CC:  Chief Complaint  Patient presents with  . New Patient (Initial Visit)    07/12/2019 Swelling in both legs    HPI MEREL SANTOLI is a 49 year old male who presents for Hospital Up and to Re-establish care.   Past Medical History:  Diagnosis Date  . AICD (automatic cardioverter/defibrillator) present 10/14/2016  . Bronchitis   . CHF (congestive heart failure) ([place])   . Depression   . Diabetes mellitus without complication ([place])   . DVT, lower extremity ([place]) 07/12/2019  . Dyslipidemia   . Hypertension   . NICM (nonischemic cardiomyopathy) ([place]) 09/15/2016  . Pancreatitis    Current Status: Since his last office visit, he is doing well with no complaints. He is deaf and is accompanied by Sign Sport and exercise psychologist. He denies visual changes, chest pain, cough, shortness of breath, heart palpitations, and falls. He has occasional headaches and dizziness with position changes. Denies severe headaches, confusion, seizures, double vision, and blurred vision, nausea and vomiting. He denies fatigue, frequent urination, blurred vision, excessive hunger, excessive thirst, weight gain, weight loss, and poor wound healing. He continues to check his feet regularly. He denies fevers, chills, recent infections, weight loss, and night sweats. Denies GI problems such as diarrhea, and constipation. He has no reports of blood in stools, dysuria and hematuria. No depression or anxiety reported today. He denies suicidal ideations, homicidal ideations, or auditory hallucinations. He is taking all medications as prescribed. He denies pain today.   Past Surgical History:  Procedure Laterality Date  . BACK SURGERY    . CARDIAC CATHETERIZATION    . ICD IMPLANT  10/14/2016  . ICD IMPLANT N/A 10/14/2016   Procedure:  ICD Implant;  Surgeon: Deboraha Sprang, MD;  Location: [facility];  Service: Cardiovascular;  Laterality: N/A;  . WRIST SURGERY      Family History  Problem Relation Age of Onset  . Hypertension Mother   . Healthy Father   . Stroke Father     Social History   Socioeconomic History  . Marital status: Legally Separated    Spouse name: Not on file  . Number of children: Not on file  . Years of education: Not on file  . Highest education level: Not on file  Occupational History  . Not on file  Tobacco Use  . Smoking status: Never Smoker  . Smokeless tobacco: Never Used  Substance and Sexual Activity  . Alcohol use: Yes    Comment: rare  . Drug use: No  . Sexual activity: Yes  Other Topics Concern  . Not on file  Social History Narrative  . Not on file   Social Determinants of Health   Financial Resource Strain:   . Difficulty of Paying Living Expenses:   Food Insecurity:   . Worried About Charity fundraiser in the Last Year:   . Arboriculturist in the Last Year:   Transportation Needs:   . Film/video editor (Medical):   Marland Kitchen Lack of Transportation (Non-Medical):   Physical Activity:   . Days of Exercise per Week:   . Minutes of Exercise per Session:   Stress:   . Feeling of Stress :   Social Connections:   . Frequency of Communication with Friends and Family:   .  Frequency of Social Gatherings with Friends and Family:   . Attends Religious Services:   . Active Member of Clubs or Organizations:   . Attends Archivist Meetings:   Marland Kitchen Marital Status:   Intimate Partner Violence:   . Fear of Current or Ex-Partner:   . Emotionally Abused:   Marland Kitchen Physically Abused:   . Sexually Abused:     Outpatient Medications Prior to Visit  Medication Sig Dispense Refill  . Apixaban Starter Pack (ELIQUIS STARTER PACK) 5 MG TBPK Take as directed on package: start with two-63m tablets twice daily for 7 days. On day 8, switch to one-561mtablet twice daily. 1  each 0  . aspirin 81 MG chewable tablet Chew 81 mg by mouth daily.    . Marland Kitchentorvastatin (LIPITOR) 80 MG tablet Take 1 tablet (80 mg total) by mouth daily. 90 tablet 3  . furosemide (LASIX) 80 MG tablet TAKE 1 TABLET(80 MG) BY MOUTH TWICE DAILY 180 tablet 0  . hydrALAZINE (APRESOLINE) 25 MG tablet Take 1.5 tablets (37.5 mg total) by mouth 3 (three) times daily. 270 tablet 3  . Insulin Glargine (LANTUS SOLOSTAR) 100 UNIT/ML Solostar Pen Inject 40 Units into the skin daily at 10 pm. (Patient taking differently: Inject 35 Units into the skin daily at 10 pm. ) 15 pen 3  . metFORMIN (GLUCOPHAGE) 1000 MG tablet Take 1 tablet (1,000 mg total) by mouth 2 (two) times daily with a meal. 180 tablet 3  . gabapentin (NEURONTIN) 300 MG capsule Take 1 capsule (300 mg total) by mouth 2 (two) times daily. 30 capsule 0  . canagliflozin (INVOKANA) 300 MG TABS tablet Take 1 tablet (300 mg total) by mouth daily before breakfast. (Patient not taking: Reported on 08/13/2019) 90 tablet 3  . carvedilol (COREG) 12.5 MG tablet Take 1.5 tablets (18.75 mg total) by mouth 2 (two) times daily. (Patient not taking: Reported on 08/13/2019) 240 tablet 3  . digoxin (LANOXIN) 0.125 MG tablet Take 1 tablet (0.125 mg total) by mouth daily. (Patient not taking: Reported on 07/12/2019) 90 tablet 3  . sacubitril-valsartan (ENTRESTO) 24-26 MG Take 1 tablet by mouth 2 (two) times daily.    . Accu-Chek Softclix Lancets lancets USE FOUR TIMES DAILY AS DIRECTED (Patient not taking: Reported on 08/13/2019) 400 each 3  . blood glucose meter kit and supplies KIT Dispense based on patient and insurance preference. Use up to four times daily as directed. (FOR ICD-9 250.00, 250.01). (Patient not taking: Reported on 08/13/2019) 1 each 0  . Blood Glucose Monitoring Suppl (ACCU-CHEK AVIVA PLUS) w/Device KIT 1 each by Does not apply route 4 (four) times daily -  before meals and at bedtime. E.11.8 (Patient not taking: Reported on 08/13/2019) 1 kit 0  . citalopram (CELEXA)  40 MG tablet Take 1 tablet (40 mg total) by mouth daily. (Patient not taking: Reported on 08/13/2019) 30 tablet 0  . glucose blood (ACCU-CHEK AVIVA PLUS) test strip Check blood three times per day and at bedtime (Patient not taking: Reported on 08/13/2019) 200 each 12  . isosorbide mononitrate (IMDUR) 30 MG 24 hr tablet Take 1 tablet (30 mg total) by mouth daily. (Patient not taking: Reported on 08/13/2019) 90 tablet 3  . Lancet Devices (ACCU-CHEK SOFTCLIX) lancets Use as instructed (Patient not taking: Reported on 08/13/2019) 1 each 0  . ondansetron (ZOFRAN ODT) 8 MG disintegrating tablet Take 1 tablet (8 mg total) by mouth every 8 (eight) hours as needed for nausea or vomiting. (Patient not taking: Reported  on 07/12/2019) 20 tablet 0  . promethazine (PHENERGAN) 25 MG tablet Take 1 tablet (25 mg total) by mouth every 6 (six) hours as needed for nausea or vomiting. (Patient not taking: Reported on 10/08/2018) 30 tablet 0  . sacubitril-valsartan (ENTRESTO) 49-51 MG Take 1 tablet by mouth 2 (two) times daily. (Patient not taking: Reported on 07/12/2019) 60 tablet 6   No facility-administered medications prior to visit.    Allergies  Allergen Reactions  . Lisinopril Cough    ROS Review of Systems  Constitutional: Positive for fatigue.  HENT: Positive for hearing loss (bilateral ).   Eyes: Negative.   Respiratory: Negative.   Cardiovascular: Negative.   Gastrointestinal: Negative.   Genitourinary: Negative.   Musculoskeletal: Negative.   Skin: Negative.   Allergic/Immunologic: Negative.   Neurological: Positive for dizziness (occasional ) and headaches (occasional ).  Hematological: Negative.   Psychiatric/Behavioral: Negative.       Objective:    Physical Exam  Constitutional: He is oriented to person, place, and time. He appears well-developed and well-nourished.  Eyes: Conjunctivae are normal.  Cardiovascular: Normal rate, regular rhythm, normal heart sounds and intact distal pulses.    Pulmonary/Chest: Effort normal and breath sounds normal.  Abdominal: Soft. Bowel sounds are normal.  Musculoskeletal:        General: Normal range of motion.     Cervical back: Normal range of motion and neck supple.  Neurological: He is alert and oriented to person, place, and time. He has normal reflexes.  Skin: Skin is warm and dry.  Psychiatric: He has a normal mood and affect. His behavior is normal. Judgment and thought content normal.  Nursing note and vitals reviewed.   BP (!) 160/93 Comment: no meds today  Pulse 70   Temp 98.3 F (36.8 C)   Ht 5' 9" (1.753 m)   Wt 231 lb (104.8 kg)   SpO2 100%   BMI 34.11 kg/m  Wt Readings from Last 3 Encounters:  08/13/19 231 lb (104.8 kg)  01/24/19 231 lb 6.4 oz (105 kg)  01/04/19 236 lb 9.6 oz (107.3 kg)     Health Maintenance Due  Topic Date Due  . COVID-19 Vaccine (1) Never done  . OPHTHALMOLOGY EXAM  05/30/2017  . FOOT EXAM  08/08/2018  . URINE MICROALBUMIN  12/07/2018  . COLONOSCOPY  Never done    There are no preventive care reminders to display for this patient.  Lab Results  Component Value Date   TSH 1.380 08/13/2019   Lab Results  Component Value Date   WBC 3.5 (L) 07/12/2019   HGB 12.0 (L) 07/12/2019   HCT 39.8 07/12/2019   MCV 93.6 07/12/2019   PLT 176 07/12/2019   Lab Results  Component Value Date   NA 141 07/12/2019   K 5.1 07/12/2019   CO2 29 07/12/2019   GLUCOSE 146 (H) 07/12/2019   BUN 23 (H) 07/12/2019   CREATININE 1.85 (H) 07/12/2019   BILITOT 1.1 07/12/2019   ALKPHOS 54 07/12/2019   AST 54 (H) 07/12/2019   ALT 31 07/12/2019   PROT 7.2 07/12/2019   ALBUMIN 3.6 07/12/2019   CALCIUM 8.7 (L) 07/12/2019   ANIONGAP 12 07/12/2019   Lab Results  Component Value Date   CHOL 327 (H) 08/13/2019   Lab Results  Component Value Date   HDL 44 08/13/2019   Lab Results  Component Value Date   LDLCALC 192 (H) 08/13/2019   Lab Results  Component Value Date   TRIG 442 (H)  08/13/2019   Lab  Results  Component Value Date   CHOLHDL 7.4 (H) 08/13/2019   Lab Results  Component Value Date   HGBA1C 9.0 (A) 08/13/2019      Assessment & Plan:   1. Bilateral deafness  2. Essential hypertension The current medical regimen is effective; blood pressure is stable today; continue present plan and medications as prescribed. He will continue to take medications as prescribed, to decrease high sodium intake, excessive alcohol intake, increase potassium intake, smoking cessation, and increase physical activity of at least 30 minutes of cardio activity daily. He will continue to follow Heart Healthy or DASH diet.  3. Type 2 diabetes mellitus with complication, with long-term current use of insulin ([place]) He will continue medication as prescribed, to decrease foods/beverages high in sugars and carbs and follow Heart Healthy or DASH diet. Increase physical activity to at least 30 minutes cardio exercise daily.  4. Hemoglobin A1C between 7% and 9% indicating borderline diabetic control Improved at 9.0 today. Monitor.   5. History of hyperglycemia - POCT glycosylated hemoglobin (Hb A1C)  6. Insomnia, unspecified type We will initiate Trazodone today.  - traZODone (DESYREL) 100 MG tablet; Take 1 tablet (100 mg total) by mouth at bedtime.  Dispense: 30 tablet; Refill: 3  7. Vitamin B12 deficiency - Vitamin B12  8. Vitamin D deficiency - Vitamin D, 25-hydroxy  9. Healthcare maintenance - POCT urinalysis dipstick - Lipid Panel - TSH  10. Follow up He will follow up in 3 months.   Meds ordered this encounter  Medications  . traZODone (DESYREL) 100 MG tablet    Sig: Take 1 tablet (100 mg total) by mouth at bedtime.    Dispense:  30 tablet    Refill:  3    Orders Placed This Encounter  Procedures  . Lipid Panel  . TSH  . Vitamin B12  . Vitamin D, 25-hydroxy  . POCT urinalysis dipstick  . POCT glycosylated hemoglobin (Hb A1C)    Referral Orders  No referral(s) requested  today    Kathe Becton,  MSN, FNP-BC [facility] [address] [place], [place] [address] [phone] fax  Problem List Items Addressed This Visit      Cardiovascular and Mediastinum   HTN (hypertension) (Chronic)     Nervous and Auditory   Deaf - Primary (Chronic)    Other Visit Diagnoses    Type 2 diabetes mellitus with complication, with long-term current use of insulin ([facility])       Hemoglobin A1C between 7% and 9% indicating borderline diabetic control       History of hyperglycemia       Relevant Orders   POCT glycosylated hemoglobin (Hb A1C) (Completed)   Insomnia, unspecified type       Relevant Medications   traZODone (DESYREL) 100 MG tablet   Vitamin B12 deficiency       Relevant Orders   Vitamin B12 (Completed)   Vitamin D deficiency       Relevant Orders   Vitamin D, 25-hydroxy (Completed)   Healthcare maintenance       Relevant Orders   POCT urinalysis dipstick (Completed)   Lipid Panel (Completed)   TSH (Completed)   Follow up          Meds ordered this encounter  Medications  . traZODone (DESYREL) 100 MG tablet    Sig: Take 1 tablet (100 mg total) by mouth at bedtime.  Dispense:  30 tablet    Refill:  3    Follow-up: Return in about 3 months (around 11/13/2019).    [name], FNP

## 2019-08-13 NOTE — Patient Instructions (Signed)
Insomnia Insomnia is a sleep disorder that makes it difficult to fall asleep or stay asleep. Insomnia can cause fatigue, low energy, difficulty concentrating, mood swings, and poor performance at work or school. There are three different ways to classify insomnia:  Difficulty falling asleep.  Difficulty staying asleep.  Waking up too early in the morning. Any type of insomnia can be long-term (chronic) or short-term (acute). Both are common. Short-term insomnia usually lasts for three months or less. Chronic insomnia occurs at least three times a week for longer than three months. What are the causes? Insomnia may be caused by another condition, situation, or substance, such as:  Anxiety.  Certain medicines.  Gastroesophageal reflux disease (GERD) or other gastrointestinal conditions.  Asthma or other breathing conditions.  Restless legs syndrome, sleep apnea, or other sleep disorders.  Chronic pain.  Menopause.  Stroke.  Abuse of alcohol, tobacco, or illegal drugs.  Mental health conditions, such as depression.  Caffeine.  Neurological disorders, such as Alzheimer's disease.  An overactive thyroid (hyperthyroidism). Sometimes, the cause of insomnia may not be known. What increases the risk? Risk factors for insomnia include:  Gender. Women are affected more often than men.  Age. Insomnia is more common as you get older.  Stress.  Lack of exercise.  Irregular work schedule or working night shifts.  Traveling between different time zones.  Certain medical and mental health conditions. What are the signs or symptoms? If you have insomnia, the main symptom is having trouble falling asleep or having trouble staying asleep. This may lead to other symptoms, such as:  Feeling fatigued or having low energy.  Feeling nervous about going to sleep.  Not feeling rested in the morning.  Having trouble concentrating.  Feeling irritable, anxious, or depressed. How  is this diagnosed? This condition may be diagnosed based on:  Your symptoms and medical history. Your health care provider may ask about: ? Your sleep habits. ? Any medical conditions you have. ? Your mental health.  A physical exam. How is this treated? Treatment for insomnia depends on the cause. Treatment may focus on treating an underlying condition that is causing insomnia. Treatment may also include:  Medicines to help you sleep.  Counseling or therapy.  Lifestyle adjustments to help you sleep better. Follow these instructions at home: Eating and drinking   Limit or avoid alcohol, caffeinated beverages, and cigarettes, especially close to bedtime. These can disrupt your sleep.  Do not eat a large meal or eat spicy foods right before bedtime. This can lead to digestive discomfort that can make it hard for you to sleep. Sleep habits   Keep a sleep diary to help you and your health care provider figure out what could be causing your insomnia. Write down: ? When you sleep. ? When you wake up during the night. ? How well you sleep. ? How rested you feel the next day. ? Any side effects of medicines you are taking. ? What you eat and drink.  Make your bedroom a dark, comfortable place where it is easy to fall asleep. ? Put up shades or blackout curtains to block light from outside. ? Use a white noise machine to block noise. ? Keep the temperature cool.  Limit screen use before bedtime. This includes: ? Watching TV. ? Using your smartphone, tablet, or computer.  Stick to a routine that includes going to bed and waking up at the same times every day and night. This can help you fall asleep faster. Consider   making a quiet activity, such as reading, part of your nighttime routine.  Try to avoid taking naps during the day so that you sleep better at night.  Get out of bed if you are still awake after 15 minutes of trying to sleep. Keep the lights down, but try reading or  doing a quiet activity. When you feel sleepy, go back to bed. General instructions  Take over-the-counter and prescription medicines only as told by your health care provider.  Exercise regularly, as told by your health care provider. Avoid exercise starting several hours before bedtime.  Use relaxation techniques to manage stress. Ask your health care provider to suggest some techniques that may work well for you. These may include: ? Breathing exercises. ? Routines to release muscle tension. ? Visualizing peaceful scenes.  Make sure that you drive carefully. Avoid driving if you feel very sleepy.  Keep all follow-up visits as told by your health care provider. This is important. Contact a health care provider if:  You are tired throughout the day.  You have trouble in your daily routine due to sleepiness.  You continue to have sleep problems, or your sleep problems get worse. Get help right away if:  You have serious thoughts about hurting yourself or someone else. If you ever feel like you may hurt yourself or others, or have thoughts about taking your own life, get help right away. You can go to your nearest emergency department or call:  Your local emergency services (911 in the [place]  A suicide crisis helpline, such as the [place] at (838) 736-4024. This is open 24 hours a day. Summary  Insomnia is a sleep disorder that makes it difficult to fall asleep or stay asleep.  Insomnia can be long-term (chronic) or short-term (acute).  Treatment for insomnia depends on the cause. Treatment may focus on treating an underlying condition that is causing insomnia.  Keep a sleep diary to help you and your health care provider figure out what could be causing your insomnia. This information is not intended to replace advice given to you by your health care provider. Make sure you discuss any questions you have with your health care provider. Document  Revised: 03/10/2017 Document Reviewed: 01/05/2017 Else[employer] Patient Education  [place]. Trazodone tablets What is this medicine? TRAZODONE (TRAZ oh done) is used to treat depression. This medicine may be used for other purposes; ask your health care provider or pharmacist if you have questions. COMMON BRAND NAME(S): Desyrel What should I tell my health care provider before I take this medicine? They need to know if you have any of these conditions:  attempted suicide or thinking about it  bipolar disorder  bleeding problems  glaucoma  heart disease, or previous heart attack  irregular heart beat  kidney or liver disease  low levels of sodium in the blood  an unusual or allergic reaction to trazodone, other medicines, foods, dyes or preservatives  pregnant or trying to get pregnant  breast-feeding How should I use this medicine? Take this medicine by mouth with a glass of water. Follow the directions on the prescription label. Take this medicine shortly after a meal or a light snack. Take your medicine at regular intervals. Do not take your medicine more often than directed. Do not stop taking this medicine suddenly except upon the advice of your doctor. Stopping this medicine too quickly may cause serious side effects or your condition may worsen. A special MedGuide will be given  to you by the pharmacist with each prescription and refill. Be sure to read this information carefully each time. Talk to your pediatrician regarding the use of this medicine in children. Special care may be needed. Overdosage: If you think you have taken too much of this medicine contact a poison control center or emergency room at once. NOTE: This medicine is only for you. Do not share this medicine with others. What if I miss a dose? If you miss a dose, take it as soon as you can. If it is almost time for your next dose, take only that dose. Do not take double or extra doses. What may  interact with this medicine? Do not take this medicine with any of the following medications:  certain medicines for fungal infections like fluconazole, itraconazole, ketoconazole, posaconazole, voriconazole  cisapride  dronedarone  linezolid  MAOIs like Carbex, Eldepryl, Marplan, Nardil, and Parnate  mesoridazine  methylene blue (injected into a vein)  pimozide  saquinavir  thioridazine This medicine may also interact with the following medications:  alcohol  antiviral medicines for HIV or AIDS  aspirin and aspirin-like medicines  barbiturates like phenobarbital  certain medicines for blood pressure, heart disease, irregular heart beat  certain medicines for depression, anxiety, or psychotic disturbances  certain medicines for migraine headache like almotriptan, eletriptan, frovatriptan, naratriptan, rizatriptan, sumatriptan, zolmitriptan  certain medicines for seizures like carbamazepine and phenytoin  certain medicines for sleep  certain medicines that treat or prevent blood clots like dalteparin, enoxaparin, warfarin  digoxin  fentanyl  lithium  NSAIDS, medicines for pain and inflammation, like ibuprofen or naproxen  other medicines that prolong the QT interval (cause an abnormal heart rhythm) like dofetilide  rasagiline  supplements like St. John's wort, kava kava, valerian  tramadol  tryptophan This list may not describe all possible interactions. Give your health care provider a list of all the medicines, herbs, non-prescription drugs, or dietary supplements you use. Also tell them if you smoke, drink alcohol, or use illegal drugs. Some items may interact with your medicine. What should I watch for while using this medicine? Tell your doctor if your symptoms do not get better or if they get worse. Visit your doctor or health care professional for regular checks on your progress. Because it may take several weeks to see the full effects of this  medicine, it is important to continue your treatment as prescribed by your doctor. Patients and their families should watch out for new or worsening thoughts of suicide or depression. Also watch out for sudden changes in feelings such as feeling anxious, agitated, panicky, irritable, hostile, aggressive, impulsive, severely restless, overly excited and hyperactive, or not being able to sleep. If this happens, especially at the beginning of treatment or after a change in dose, call your health care professional. Dennis Bast may get drowsy or dizzy. Do not drive, use machinery, or do anything that needs mental alertness until you know how this medicine affects you. Do not stand or sit up quickly, especially if you are an older patient. This reduces the risk of dizzy or fainting spells. Alcohol may interfere with the effect of this medicine. Avoid alcoholic drinks. This medicine may cause dry eyes and blurred vision. If you wear contact lenses you may feel some discomfort. Lubricating drops may help. See your eye doctor if the problem does not go away or is severe. Your mouth may get dry. Chewing sugarless gum, sucking hard candy and drinking plenty of water may help. Contact your doctor  if the problem does not go away or is severe. What side effects may I notice from receiving this medicine? Side effects that you should report to your doctor or health care professional as soon as possible:  allergic reactions like skin rash, itching or hives, swelling of the face, lips, or tongue  elevated mood, decreased need for sleep, racing thoughts, impulsive behavior  confusion  fast, irregular heartbeat  feeling faint or lightheaded, falls  feeling agitated, angry, or irritable  loss of balance or coordination  painful or prolonged erections  restlessness, pacing, inability to keep still  suicidal thoughts or other mood changes  tremors  trouble sleeping  seizures  unusual bleeding or bruising Side  effects that usually do not require medical attention (report to your doctor or health care professional if they continue or are bothersome):  change in sex drive or performance  change in appetite or weight  constipation  headache  muscle aches or pains  nausea This list may not describe all possible side effects. Call your doctor for medical advice about side effects. You may report side effects to FDA at 1-800-FDA-1088. Where should I keep my medicine? Keep out of the reach of children. Store at room temperature between 15 and 30 degrees C (59 to 86 degrees F). Protect from light. Keep container tightly closed. Throw away any unused medicine after the expiration date. NOTE: This sheet is a summary. It may not cover all possible information. If you have questions about this medicine, talk to your doctor, pharmacist, or health care provider.  2020 Elsevier/Gold Standard (2018-03-20 11:46:46)

## 2019-08-14 ENCOUNTER — Other Ambulatory Visit: Payer: Self-pay | Admitting: Family Medicine

## 2019-08-14 DIAGNOSIS — E118 Type 2 diabetes mellitus with unspecified complications: Secondary | ICD-10-CM

## 2019-08-14 LAB — TSH: TSH: 1.38 u[IU]/mL (ref 0.450–4.500)

## 2019-08-14 LAB — VITAMIN D 25 HYDROXY (VIT D DEFICIENCY, FRACTURES): Vit D, 25-Hydroxy: 5.8 ng/mL — ABNORMAL LOW (ref 30.0–100.0)

## 2019-08-14 LAB — LIPID PANEL
Chol/HDL Ratio: 7.4 ratio — ABNORMAL HIGH (ref 0.0–5.0)
Cholesterol, Total: 327 mg/dL — ABNORMAL HIGH (ref 100–199)
HDL: 44 mg/dL (ref >39)
LDL Chol Calc (NIH): 192 mg/dL — ABNORMAL HIGH (ref 0–99)
Triglycerides: 442 mg/dL — ABNORMAL HIGH (ref 0–149)
VLDL Cholesterol Cal: 91 mg/dL — ABNORMAL HIGH (ref 5–40)

## 2019-08-14 LAB — VITAMIN B12: Vitamin B-12: 458 pg/mL (ref 232–1245)

## 2019-08-15 ENCOUNTER — Encounter: Payer: Self-pay | Admitting: Family Medicine

## 2019-08-15 DIAGNOSIS — G4700 Insomnia, unspecified: Secondary | ICD-10-CM | POA: Insufficient documentation

## 2019-08-15 DIAGNOSIS — Z794 Long term (current) use of insulin: Secondary | ICD-10-CM | POA: Insufficient documentation

## 2019-08-15 DIAGNOSIS — E118 Type 2 diabetes mellitus with unspecified complications: Secondary | ICD-10-CM | POA: Insufficient documentation

## 2019-08-15 DIAGNOSIS — R7303 Prediabetes: Secondary | ICD-10-CM | POA: Insufficient documentation

## 2019-08-15 DIAGNOSIS — H9193 Unspecified hearing loss, bilateral: Secondary | ICD-10-CM | POA: Insufficient documentation

## 2019-08-15 DIAGNOSIS — E119 Type 2 diabetes mellitus without complications: Secondary | ICD-10-CM | POA: Insufficient documentation

## 2019-08-18 ENCOUNTER — Encounter (HOSPITAL_COMMUNITY): Payer: Self-pay | Admitting: Emergency Medicine

## 2019-08-18 ENCOUNTER — Emergency Department (HOSPITAL_COMMUNITY)
Admission: EM | Admit: 2019-08-18 | Discharge: 2019-08-19 | Disposition: A | Discharge status: Home / Self Care | Payer: Medicare Other | Attending: Emergency Medicine | Admitting: Emergency Medicine

## 2019-08-18 ENCOUNTER — Emergency Department (HOSPITAL_COMMUNITY): Payer: Medicare Other

## 2019-08-18 DIAGNOSIS — R072 Precordial pain: Secondary | ICD-10-CM | POA: Insufficient documentation

## 2019-08-18 DIAGNOSIS — Z794 Long term (current) use of insulin: Secondary | ICD-10-CM | POA: Diagnosis not present

## 2019-08-18 DIAGNOSIS — R05 Cough: Secondary | ICD-10-CM | POA: Insufficient documentation

## 2019-08-18 DIAGNOSIS — R739 Hyperglycemia, unspecified: Secondary | ICD-10-CM

## 2019-08-18 DIAGNOSIS — R42 Dizziness and giddiness: Secondary | ICD-10-CM | POA: Diagnosis not present

## 2019-08-18 DIAGNOSIS — Z95810 Presence of automatic (implantable) cardiac defibrillator: Secondary | ICD-10-CM | POA: Diagnosis not present

## 2019-08-18 DIAGNOSIS — R079 Chest pain, unspecified: Secondary | ICD-10-CM | POA: Diagnosis not present

## 2019-08-18 DIAGNOSIS — R1084 Generalized abdominal pain: Secondary | ICD-10-CM | POA: Diagnosis not present

## 2019-08-18 DIAGNOSIS — R112 Nausea with vomiting, unspecified: Secondary | ICD-10-CM | POA: Insufficient documentation

## 2019-08-18 DIAGNOSIS — Z79899 Other long term (current) drug therapy: Secondary | ICD-10-CM | POA: Diagnosis not present

## 2019-08-18 DIAGNOSIS — I110 Hypertensive heart disease with heart failure: Secondary | ICD-10-CM | POA: Diagnosis not present

## 2019-08-18 DIAGNOSIS — Z7982 Long term (current) use of aspirin: Secondary | ICD-10-CM | POA: Insufficient documentation

## 2019-08-18 DIAGNOSIS — R1110 Vomiting, unspecified: Secondary | ICD-10-CM | POA: Diagnosis not present

## 2019-08-18 DIAGNOSIS — E1165 Type 2 diabetes mellitus with hyperglycemia: Secondary | ICD-10-CM | POA: Diagnosis not present

## 2019-08-18 DIAGNOSIS — R0689 Other abnormalities of breathing: Secondary | ICD-10-CM | POA: Diagnosis not present

## 2019-08-18 DIAGNOSIS — Z9114 Patient's other noncompliance with medication regimen: Secondary | ICD-10-CM | POA: Diagnosis not present

## 2019-08-18 DIAGNOSIS — R0789 Other chest pain: Secondary | ICD-10-CM | POA: Diagnosis not present

## 2019-08-18 DIAGNOSIS — I5022 Chronic systolic (congestive) heart failure: Secondary | ICD-10-CM | POA: Insufficient documentation

## 2019-08-18 DIAGNOSIS — R519 Headache, unspecified: Secondary | ICD-10-CM | POA: Diagnosis not present

## 2019-08-18 LAB — CBC
HCT: 40.3 % (ref 39.0–52.0)
Hemoglobin: 12.8 g/dL — ABNORMAL LOW (ref 13.0–17.0)
MCH: 28.8 pg (ref 26.0–34.0)
MCHC: 31.8 g/dL (ref 30.0–36.0)
MCV: 90.8 fL (ref 80.0–100.0)
Platelets: 223 10*3/uL (ref 150–400)
RBC: 4.44 MIL/uL (ref 4.22–5.81)
RDW: 12.8 % (ref 11.5–15.5)
WBC: 3.5 10*3/uL — ABNORMAL LOW (ref 4.0–10.5)
nRBC: 0 % (ref 0.0–0.2)

## 2019-08-18 LAB — CBG MONITORING, ED: Glucose-Capillary: 318 mg/dL — ABNORMAL HIGH (ref 70–99)

## 2019-08-18 MED ORDER — FENTANYL CITRATE (PF) 100 MCG/2ML IJ SOLN
100.0000 ug | Freq: Once | INTRAMUSCULAR | Status: AC
  Administered 2019-08-19: 100 ug via INTRAVENOUS
  Filled 2019-08-18: qty 2

## 2019-08-18 MED ORDER — SODIUM CHLORIDE 0.9% FLUSH
3.0000 mL | Freq: Once | INTRAVENOUS | Status: AC
  Administered 2019-08-19: 3 mL via INTRAVENOUS

## 2019-08-18 MED ORDER — ONDANSETRON HCL 4 MG/2ML IJ SOLN
4.0000 mg | Freq: Once | INTRAMUSCULAR | Status: AC
  Administered 2019-08-19: 4 mg via INTRAVENOUS
  Filled 2019-08-18: qty 2

## 2019-08-18 NOTE — ED Triage Notes (Signed)
As per [facility] pt has been N/V, dizzy, chest pain. Pt BS 385.[id number] as well. Pt had not been taking his meds as per family/. Pt is hearing impaired

## 2019-08-18 NOTE — ED Provider Notes (Signed)
[facility] DEPT Provider Note   CSN: [id number] Arrival date & time: 08/18/19  2249     History Chief Complaint  Patient presents with  . Nausea    Troy Rush is a 49 y.o. male.  The history is provided by the patient. A language interpreter was used (ASL Margarita Grizzle [phone] ).  Emesis Severity:  Moderate Number of daily episodes:  4 Emesis appearance: yellow. Progression:  Worsening Chronicity:  New Associated symptoms: abdominal pain, cough and headaches   Associated symptoms: no diarrhea and no fever   Associated symptoms comment:  Chest pain Patient with history of nonischemic cardiomyopathy, depression, diabetes, DVT, ICD Symptoms started today Patient reports he woke up without up to 4 episodes of yellow emesis.  He also reports chest squeezing.  He also reports diffuse abdominal pain which is worsening.  No diarrhea.  No fevers.  He reports headache.  Patient was brought via EMS and is reported he was drinking alcohol.  Patient has not been taking his medications   Patient denies any recent ICD shocks Past Medical History:  Diagnosis Date  . AICD (automatic cardioverter/defibrillator) present 10/14/2016  . Bilateral deafness   . Bronchitis   . CHF (congestive heart failure) ([place])   . Depression   . Diabetes mellitus without complication ([place])   . DVT, lower extremity ([place]) 07/12/2019  . Dyslipidemia   . Hypertension   . Insomnia 08/2019  . NICM (nonischemic cardiomyopathy) ([place]) 09/15/2016  . Pancreatitis     Patient Active Problem List   Diagnosis Date Noted  . Bilateral deafness 08/15/2019  . Type 2 diabetes mellitus with complication, with long-term current use of insulin ([place]) 08/15/2019  . Hemoglobin A1C between 7% and 9% indicating borderline diabetic control 08/15/2019  . Insomnia 08/15/2019  . Abdominal pain 11/14/2018  . Ischemic cardiomyopathy 10/14/2016  . NICM (nonischemic cardiomyopathy) ([place]) 09/15/2016  . Chronic  systolic CHF (congestive heart failure) ([place]) 05/30/2016  . HTN (hypertension) 05/30/2016  . Deaf 05/30/2016  . Hyperkalemia 05/30/2016  . Diabetes ([place]) 03/21/2016  . Major depressive disorder, recurrent episode ([place]) 03/21/2016    Past Surgical History:  Procedure Laterality Date  . BACK SURGERY    . CARDIAC CATHETERIZATION    . ICD IMPLANT  10/14/2016  . ICD IMPLANT N/A 10/14/2016   Procedure: ICD Implant;  Surgeon: Deboraha Sprang, MD;  Location: [facility];  Service: Cardiovascular;  Laterality: N/A;  . WRIST SURGERY         Family History  Problem Relation Age of Onset  . Hypertension Mother   . Healthy Father   . Stroke Father     Social History   Tobacco Use  . Smoking status: Never Smoker  . Smokeless tobacco: Never Used  Substance Use Topics  . Alcohol use: Yes    Comment: rare  . Drug use: No    Home Medications Prior to Admission medications   Medication Sig Start Date End Date Taking? Authorizing Provider  Apixaban Starter Pack (ELIQUIS STARTER PACK) 5 MG TBPK Take as directed on package: start with two-5mg  tablets twice daily for 7 days. On day 8, switch to one-5mg  tablet twice daily. 07/12/19   Quintella Reichert, MD  aspirin 81 MG chewable tablet Chew 81 mg by mouth daily.    [provider]  atorvastatin (LIPITOR) 80 MG tablet Take 1 tablet (80 mg total) by mouth daily. 07/13/18   Lanae Boast, FNP  canagliflozin (INVOKANA) 300 MG TABS tablet  Take 1 tablet (300 mg total) by mouth daily before breakfast. Patient not taking: Reported on 08/13/2019 07/13/18   Lanae Boast, FNP  carvedilol (COREG) 12.5 MG tablet Take 1.5 tablets (18.75 mg total) by mouth 2 (two) times daily. Patient not taking: Reported on 08/13/2019 08/17/18 08/12/19  Larey Dresser, MD  digoxin (LANOXIN) 0.125 MG tablet Take 1 tablet (0.125 mg total) by mouth daily. Patient not taking: Reported on 07/12/2019 08/27/18   Georgiana Shore, NP  furosemide (LASIX) 80 MG tablet TAKE 1  TABLET(80 MG) BY MOUTH TWICE DAILY 08/12/19   Larey Dresser, MD  hydrALAZINE (APRESOLINE) 25 MG tablet Take 1.5 tablets (37.5 mg total) by mouth 3 (three) times daily. 11/09/18 08/13/19  Clegg, Amy D, NP  Insulin Glargine (LANTUS SOLOSTAR) 100 UNIT/ML Solostar Pen Inject 40 Units into the skin daily at 10 pm. Patient taking differently: Inject 35 Units into the skin daily at 10 pm.  07/13/18   Lanae Boast, FNP  metFORMIN (GLUCOPHAGE) 1000 MG tablet Take 1 tablet (1,000 mg total) by mouth 2 (two) times daily with a meal. 07/13/18   Lanae Boast, FNP  sacubitril-valsartan (ENTRESTO) 24-26 MG Take 1 tablet by mouth 2 (two) times daily.    [provider]  traZODone (DESYREL) 100 MG tablet Take 1 tablet (100 mg total) by mouth at bedtime. 08/13/19   [name], FNP    Allergies    Lisinopril  Review of Systems   Review of Systems  Constitutional: Negative for fever.  Respiratory: Positive for cough and shortness of breath.   Cardiovascular: Positive for chest pain.  Gastrointestinal: Positive for abdominal pain and vomiting. Negative for diarrhea.  Neurological: Positive for headaches.  All other systems reviewed and are negative.   Physical Exam Updated Vital Signs BP (!) 197/109 (BP Location: Left Arm)   Pulse 84   Temp 97.9 F (36.6 C) (Oral)   Resp (!) 22   SpO2 99%   Physical Exam CONSTITUTIONAL: Well developed/well nourished, no acute distress HEAD: Normocephalic/atraumatic EYES: EOMI/PERRL, no icterus ENMT: Mucous membranes moist NECK: supple no meningeal signs SPINE/BACK:entire spine nontender CV: S1/S2 noted, no murmurs/rubs/gallops noted LUNGS: Lungs are clear to auscultation bilaterally, no apparent distress ABDOMEN: soft, mild diffuse tenderness, no rebound or guarding, bowel sounds noted throughout abdomen GU:no cva tenderness NEURO: Pt is awake/alert/appropriate, moves all extremitiesx4.  No facial droop.  EXTREMITIES: pulses normal/equal, full  ROM SKIN: warm, color normal PSYCH: no abnormalities of mood noted, alert and oriented to situation  ED Results / Procedures / Treatments   Labs (all labs ordered are listed, but only abnormal results are displayed) Labs Reviewed  BASIC METABOLIC PANEL - Abnormal; Notable for the following components:      Result Value   Glucose, Bld 357 (*)    BUN 27 (*)    Creatinine, Ser 1.42 (*)    GFR calc non Af Amer 57 (*)    All other components within normal limits  CBC - Abnormal; Notable for the following components:   WBC 3.5 (*)    Hemoglobin 12.8 (*)    All other components within normal limits  DIGOXIN LEVEL - Abnormal; Notable for the following components:   Digoxin Level <0.2 (*)    All other components within normal limits  HEPATIC FUNCTION PANEL - Abnormal; Notable for the following components:   Bilirubin, Direct 0.3 (*)    All other components within normal limits  CBG MONITORING, ED - Abnormal; Notable for the following components:  Glucose-Capillary 318 (*)    All other components within normal limits  TROPONIN I (HIGH SENSITIVITY) - Abnormal; Notable for the following components:   Troponin I (High Sensitivity) 25 (*)    All other components within normal limits  TROPONIN I (HIGH SENSITIVITY) - Abnormal; Notable for the following components:   Troponin I (High Sensitivity) 23 (*)    All other components within normal limits  ETHANOL  LIPASE, BLOOD    EKG EKG Interpretation  Date/Time:  Sunday Aug 18 2019 23:04:33 EDT Ventricular Rate:  85 PR Interval:    QRS Duration: 96 QT Interval:  410 QTC Calculation: 488 R Axis:   93 Text Interpretation: Sinus rhythm Consider right atrial enlargement Borderline right axis deviation Consider anterior infarct No significant change since last tracing Confirmed by Ripley Fraise [id number]) on 08/18/2019 11:31:29 PM   Radiology CT ABDOMEN PELVIS W CONTRAST  Result Date: 08/19/2019 CLINICAL DATA:  Nausea and vomiting,  dizziness, chest pain, hyperglycemia EXAM: CT ABDOMEN AND PELVIS WITH CONTRAST TECHNIQUE: Multidetector CT imaging of the abdomen and pelvis was performed using the standard protocol following bolus administration of intravenous contrast. CONTRAST:  171mL OMNIPAQUE IOHEXOL 300 MG/ML  SOLN COMPARISON:  11/14/2018 FINDINGS: Lower chest: No acute pleural or parenchymal lung disease. Stable cardiomegaly. Hepatobiliary: No focal liver abnormality is seen. No gallstones, gallbladder wall thickening, or biliary dilatation. Pancreas: Unremarkable. No pancreatic ductal dilatation or surrounding inflammatory changes. Spleen: Normal in size without focal abnormality. Adrenals/Urinary Tract: Adrenal glands are unremarkable. Kidneys are normal, without renal calculi, focal lesion, or hydronephrosis. Bladder is unremarkable. Stomach/Bowel: No bowel obstruction or ileus. Normal appendix right lower quadrant. No bowel wall thickening or inflammatory change. Vascular/Lymphatic: Aortic atherosclerosis. No enlarged abdominal or pelvic lymph nodes. Reproductive: Prostate is unremarkable. Other: There is a small umbilical hernia unchanged. No free fluid or free gas. Musculoskeletal: No acute or destructive bony lesions. Reconstructed images demonstrate no additional findings. IMPRESSION: 1. No acute intra-abdominal or intrapelvic process. 2. Stable cardiomegaly. 3. Aortic Atherosclerosis (ICD10-I70.0). Electronically Signed   By: Randa Ngo M.D.   On: 08/19/2019 01:50   DG Chest Portable 1 View  Result Date: 08/18/2019 CLINICAL DATA:  Chest pain, nausea and vomiting, dizziness EXAM: PORTABLE CHEST 1 VIEW COMPARISON:  07/12/2019 FINDINGS: Single frontal view of the chest demonstrates single lead pacer unchanged. Cardiac silhouette is enlarged. Mild central vascular congestion is chronic. No airspace disease, effusion, or pneumothorax. IMPRESSION: 1. Chronic central vascular congestion.  No acute process. 2. Stable enlarged cardiac  silhouette. Electronically Signed   By: Randa Ngo M.D.   On: 08/18/2019 23:48    Procedures Procedures   Medications Ordered in ED Medications  sodium chloride flush (NS) 0.9 % injection 3 mL ( Intravenous Not Given 08/19/19 0217)  fentaNYL (SUBLIMAZE) injection 100 mcg (100 mcg Intravenous Given 08/19/19 0019)  ondansetron (ZOFRAN) injection 4 mg (4 mg Intravenous Given 08/19/19 0019)  iohexol (OMNIPAQUE) 300 MG/ML solution 100 mL (100 mLs Intravenous Contrast Given 08/19/19 0129)  HYDROcodone-acetaminophen (NORCO/VICODIN) 5-325 MG per tablet 1 tablet (1 tablet Oral Given 08/19/19 0330)    ED Course  I have reviewed the triage vital signs and the nursing notes.  Pertinent labs & imaging results that were available during my care of the patient were reviewed by me and considered in my medical decision making (see chart for details).    MDM Rules/Calculators/A&P  This patient presents to the ED for concern of vomiting/abdominal pain, this involves an extensive number of treatment options, and is a complaint that carries with it a high risk of complications and morbidity.  The differential diagnosis includes ACS, pancreatitis, cholecystitis  12:53 AM Patient reports his chest pain is resolved.  Reports diffuse abdominal pain.  Mild diffuse tenderness.  We will proceed with CT imaging.  Patient reports he did not take his medications today due to vomiting.  He has otherwise been compliant. Overall patient does appear to be improved  Lab Tests:   I Ordered, reviewed, and interpreted labs, which included cbc/troponin/bmp/lft/lipas  Medicines ordered:   I ordered medication fentanyl/zofran  For nausea and pain   Imaging Studies ordered:   I ordered imaging studies which included CXR/CT abd/pelvis  I independently visualized and interpreted imaging which showed no acute findings on CXR, CT abdomen pelvis-no acute findings  Additional history  obtained:   Additional history obtained from family  Previous records obtained and reviewed      Reevaluation:  After the interventions stated above, I reevaluated the patient and found patient is improving and sleeping.  3:35 AM Patient presented for nonbloody emesis at home as well as abdominal pain.  He also mention chest pain that resolved.  He continued to have abdominal pain while in the ER and a CT abdomen pelvis was ordered.  No acute findings were found.  I have low suspicion for acute coronary syndrome at this time.  Patient has a known history of nonischemic cardiomyopathy. On reassessment patient is sleeping and in no acute distress.  Patient was found to have hyperglycemia without anion gap.  This is likely due to him having vomiting and not taking his home medicines. Overall he appears improved. I utilized the ASL interpreter and answered all questions.  Patient does tell me he has had these episodes before of unclear etiology.  Though it was mentioned in the triage notes, he does not mention alcohol use.  Denies NSAID abuse He will be referred to gastroenterology Daughter is at bedside and he allowed her to be engaged in the conversation. Overall I feel he is appropriate for outpatient management Final Clinical Impression(s) / ED Diagnoses Final diagnoses:  Non-intractable vomiting with nausea, unspecified vomiting type  Precordial pain  Generalized abdominal pain  Hyperglycemia    Rx / DC Orders ED Discharge Orders    None       Ripley Fraise, MD 08/19/19 [phone]

## 2019-08-19 ENCOUNTER — Encounter: Payer: Self-pay | Admitting: Family Medicine

## 2019-08-19 ENCOUNTER — Emergency Department (HOSPITAL_COMMUNITY): Payer: Medicare Other

## 2019-08-19 ENCOUNTER — Encounter (HOSPITAL_COMMUNITY): Payer: Self-pay

## 2019-08-19 ENCOUNTER — Other Ambulatory Visit: Payer: Self-pay | Admitting: Family Medicine

## 2019-08-19 DIAGNOSIS — R112 Nausea with vomiting, unspecified: Secondary | ICD-10-CM | POA: Diagnosis not present

## 2019-08-19 DIAGNOSIS — E559 Vitamin D deficiency, unspecified: Secondary | ICD-10-CM

## 2019-08-19 DIAGNOSIS — R519 Headache, unspecified: Secondary | ICD-10-CM

## 2019-08-19 LAB — BASIC METABOLIC PANEL
Anion gap: 10 (ref 5–15)
BUN: 27 mg/dL — ABNORMAL HIGH (ref 6–20)
CO2: 27 mmol/L (ref 22–32)
Calcium: 9.4 mg/dL (ref 8.9–10.3)
Chloride: 104 mmol/L (ref 98–111)
Creatinine, Ser: 1.42 mg/dL — ABNORMAL HIGH (ref 0.61–1.24)
GFR calc Af Amer: >60 mL/min (ref >60)
GFR calc non Af Amer: 57 mL/min — ABNORMAL LOW (ref >60)
Glucose, Bld: 357 mg/dL — ABNORMAL HIGH (ref 70–99)
Potassium: 5 mmol/L (ref 3.5–5.1)
Sodium: 141 mmol/L (ref 135–145)

## 2019-08-19 LAB — HEPATIC FUNCTION PANEL
ALT: 26 U/L (ref 0–44)
AST: 34 U/L (ref 15–41)
Albumin: 3.7 g/dL (ref 3.5–5.0)
Alkaline Phosphatase: 50 U/L (ref 38–126)
Bilirubin, Direct: 0.3 mg/dL — ABNORMAL HIGH (ref 0.0–0.2)
Indirect Bilirubin: 0.5 mg/dL (ref 0.3–0.9)
Total Bilirubin: 0.8 mg/dL (ref 0.3–1.2)
Total Protein: 7.6 g/dL (ref 6.5–8.1)

## 2019-08-19 LAB — LIPASE, BLOOD: Lipase: 50 U/L (ref 11–51)

## 2019-08-19 LAB — TROPONIN I (HIGH SENSITIVITY)
Troponin I (High Sensitivity): 25 ng/L — ABNORMAL HIGH (ref <18)
Troponin I (High Sensitivity): 23 ng/L — ABNORMAL HIGH (ref <18)

## 2019-08-19 LAB — ETHANOL: Alcohol, Ethyl (B): <10 mg/dL (ref <10)

## 2019-08-19 LAB — DIGOXIN LEVEL: Digoxin Level: <0.2 ng/mL — ABNORMAL LOW (ref 0.8–2.0)

## 2019-08-19 MED ORDER — SODIUM CHLORIDE (PF) 0.9 % IJ SOLN
INTRAMUSCULAR | Status: AC
  Filled 2019-08-19: qty 50

## 2019-08-19 MED ORDER — IOHEXOL 300 MG/ML  SOLN
100.0000 mL | Freq: Once | INTRAMUSCULAR | Status: AC | PRN
  Administered 2019-08-19: 100 mL via INTRAVENOUS

## 2019-08-19 MED ORDER — VITAMIN D (ERGOCALCIFEROL) 1.25 MG (50000 UNIT) PO CAPS: 50000.0000 [IU] | ORAL_CAPSULE | ORAL | 6 refills | Status: DC

## 2019-08-19 MED ORDER — HYDROCODONE-ACETAMINOPHEN 5-325 MG PO TABS
1.0000 | ORAL_TABLET | Freq: Once | ORAL | Status: AC
  Administered 2019-08-19: 1 via ORAL
  Filled 2019-08-19: qty 1

## 2019-08-19 MED ORDER — TOPIRAMATE 50 MG PO TABS: ORAL_TABLET | ORAL | 2 refills | Status: DC

## 2019-08-19 MED ORDER — FENTANYL CITRATE (PF) 100 MCG/2ML IJ SOLN: 50.0000 ug | Freq: Once | INTRAMUSCULAR | Status: DC

## 2019-08-19 NOTE — ED Notes (Signed)
Pt ambulatory to the restroom without assistance.  

## 2019-08-19 NOTE — Discharge Instructions (Addendum)
Your blood sugar was very high, be sure to take your diabetic medications.  Abdominal (belly) pain can be caused by many things. any cases can be observed and treated at home after initial evaluation in the emergency department. Even though you are being discharged home, abdominal pain can be unpredictable. Therefore, you need a repeated exam if your pain does not resolve, returns, or worsens. Most patients with abdominal pain don't have to be admitted to the hospital or have surgery, but serious problems like appendicitis and gallbladder attacks can start out as nonspecific pain. Many abdominal conditions cannot be diagnosed in one visit, so follow-up evaluations are very important. SEEK IMMEDIATE MEDICAL ATTENTION IF: The pain does not go away or becomes severe, particularly over the next 8-12 hours.  A temperature above 100.95F develops.  Repeated vomiting occurs (multiple episodes).  The pain becomes localized to portions of the abdomen. The right side could possibly be appendicitis. In an adult, the left lower portion of the abdomen could be colitis or diverticulitis.  Blood is being passed in stools or vomit (bright red or black tarry stools).  Return also if you develop chest pain, difficulty breathing, dizziness or fainting, or become confused, poorly responsive, or inconsolable.

## 2019-09-06 ENCOUNTER — Other Ambulatory Visit: Payer: Self-pay

## 2019-09-06 ENCOUNTER — Ambulatory Visit (HOSPITAL_COMMUNITY)
Admission: RE | Admit: 2019-09-06 | Discharge: 2019-09-06 | Disposition: A | Discharge status: Home / Self Care | Payer: Medicare Other | Source: Ambulatory Visit | Attending: Cardiology | Admitting: Cardiology

## 2019-09-06 ENCOUNTER — Encounter (HOSPITAL_COMMUNITY): Payer: Self-pay | Admitting: Cardiology

## 2019-09-06 VITALS — BP 160/100 | HR 81 | Ht 69.0 in | Wt 229.8 lb

## 2019-09-06 DIAGNOSIS — I428 Other cardiomyopathies: Secondary | ICD-10-CM | POA: Diagnosis not present

## 2019-09-06 DIAGNOSIS — I5022 Chronic systolic (congestive) heart failure: Secondary | ICD-10-CM | POA: Diagnosis not present

## 2019-09-06 DIAGNOSIS — Z7901 Long term (current) use of anticoagulants: Secondary | ICD-10-CM | POA: Insufficient documentation

## 2019-09-06 DIAGNOSIS — Z79899 Other long term (current) drug therapy: Secondary | ICD-10-CM | POA: Insufficient documentation

## 2019-09-06 DIAGNOSIS — H9190 Unspecified hearing loss, unspecified ear: Secondary | ICD-10-CM | POA: Insufficient documentation

## 2019-09-06 DIAGNOSIS — Z86718 Personal history of other venous thrombosis and embolism: Secondary | ICD-10-CM | POA: Insufficient documentation

## 2019-09-06 DIAGNOSIS — F329 Major depressive disorder, single episode, unspecified: Secondary | ICD-10-CM | POA: Insufficient documentation

## 2019-09-06 DIAGNOSIS — E785 Hyperlipidemia, unspecified: Secondary | ICD-10-CM | POA: Insufficient documentation

## 2019-09-06 DIAGNOSIS — Z794 Long term (current) use of insulin: Secondary | ICD-10-CM | POA: Diagnosis not present

## 2019-09-06 DIAGNOSIS — E1122 Type 2 diabetes mellitus with diabetic chronic kidney disease: Secondary | ICD-10-CM | POA: Insufficient documentation

## 2019-09-06 DIAGNOSIS — Z95810 Presence of automatic (implantable) cardiac defibrillator: Secondary | ICD-10-CM | POA: Diagnosis not present

## 2019-09-06 DIAGNOSIS — N183 Chronic kidney disease, stage 3 unspecified: Secondary | ICD-10-CM | POA: Insufficient documentation

## 2019-09-06 DIAGNOSIS — I130 Hypertensive heart and chronic kidney disease with heart failure and stage 1 through stage 4 chronic kidney disease, or unspecified chronic kidney disease: Secondary | ICD-10-CM | POA: Diagnosis not present

## 2019-09-06 LAB — BASIC METABOLIC PANEL
Anion gap: 8 (ref 5–15)
BUN: 24 mg/dL — ABNORMAL HIGH (ref 6–20)
CO2: 24 mmol/L (ref 22–32)
Calcium: 8.9 mg/dL (ref 8.9–10.3)
Chloride: 102 mmol/L (ref 98–111)
Creatinine, Ser: 1.46 mg/dL — ABNORMAL HIGH (ref 0.61–1.24)
GFR calc Af Amer: >60 mL/min (ref >60)
GFR calc non Af Amer: 55 mL/min — ABNORMAL LOW (ref >60)
Glucose, Bld: 180 mg/dL — ABNORMAL HIGH (ref 70–99)
Potassium: 5.1 mmol/L (ref 3.5–5.1)
Sodium: 134 mmol/L — ABNORMAL LOW (ref 135–145)

## 2019-09-06 LAB — LDL CHOLESTEROL, DIRECT: Direct LDL: 111.1 mg/dL — ABNORMAL HIGH (ref 0–99)

## 2019-09-06 LAB — LIPID PANEL
Cholesterol: 315 mg/dL — ABNORMAL HIGH (ref 0–200)
HDL: 43 mg/dL (ref >40)
LDL Cholesterol: UNDETERMINED mg/dL (ref 0–99)
Total CHOL/HDL Ratio: 7.3 RATIO
Triglycerides: 542 mg/dL — ABNORMAL HIGH (ref <150)
VLDL: UNDETERMINED mg/dL (ref 0–40)

## 2019-09-06 LAB — DIGOXIN LEVEL: Digoxin Level: <0.2 ng/mL — ABNORMAL LOW (ref 0.8–2.0)

## 2019-09-06 MED ORDER — ISOSORBIDE MONONITRATE ER 30 MG PO TB24
30.0000 mg | ORAL_TABLET | Freq: Every day | ORAL | 3 refills | Status: DC
Start: 2019-09-06 — End: 2020-03-18

## 2019-09-06 MED ORDER — ENTRESTO 24-26 MG PO TABS: 1.0000 | ORAL_TABLET | Freq: Two times a day (BID) | ORAL | 5 refills | Status: DC

## 2019-09-06 MED ORDER — APIXABAN 5 MG PO TABS: 5.0000 mg | ORAL_TABLET | Freq: Two times a day (BID) | ORAL | 4 refills | Status: DC

## 2019-09-06 NOTE — Patient Instructions (Signed)
START Eliquis 5mg  (1 tab) twice a day  STOP Aspirin  START Imdur (Isosorbide) 30mg  (1 tab) daily  START Entresto 24/26mg  (1 tab) twice a day  Labs today and repeat in 10 days We will only contact you if something comes back abnormal or we need to make some changes. Otherwise no news is good news!  Your physician recommends that you schedule a follow-up appointment in: 3 weeks with the pharmacist and 5 weeks with the Nurse Practitioner  Please call office at [phone] option [phone] if you have any questions or concerns.   At the [facility], you and your health needs are our priority. As part of our continuing mission to provide you with exceptional heart care, we have created designated Provider Care Teams. These Care Teams include your primary Cardiologist (physician) and Advanced Practice Providers (APPs- Physician Assistants and Nurse Practitioners) who all work together to provide you with the care you need, when you need it.   You may see any of the following providers on your designated Care Team at your next follow up: Marland Kitchen Dr Glori Bickers . Dr Loralie Champagne . Darrick Grinder, NP . Lyda Jester, PA . Audry Riles, PharmD   Please be sure to bring in all your medications bottles to every appointment.

## 2019-09-08 NOTE — Progress Notes (Signed)
Advanced Heart Failure Clinic Note   PCP: Cammie Sickle Cardiology: Dr. Aundra Dubin  49 y.o. with deafness, HTN, DM, and chronic systolic CHF presents for followup of CHF.  Patient had no known cardiac problems until 10/16.  At that time, he developed exertional dyspnea and was admitted to a hospital in [place], [place] with acute systolic CHF.  Echo 10/16 showed EF 15-20%.  Cardiac cath showed no significant coronary disease.  He was diuresed and started on cardiac meds. Subsequently, he moved to [facility].  Now working in a [employer].  Repeat echo in 5/18 showed persistently low EF, 25-30%.  He had [place] placed.   He was admitted briefly with CHF exacerbation in 8/20.   Echo in 9/20 showed EF 30-35% with diffuse hypokinesis.   Ultrasound in 4/21 showed right leg DVT and CT showed a PE.  No obvious trigger (no long trip, no surgery, no prolonged immobility).  He was started on apixaban but only took the 2 week starter pack and has not had any since then.   Sign language interpreter present today.  He is out of [place].  I am not sure how well he is taking his other meds.  LDL was very high earlier this month but he says that he is taking his statin.  Digoxin level has been < 0.2 but he says that he has been taking digoxin.  No chest pain.  He is not short of breath walking on flat ground or up 1 flight of stairs.  No orthopnea/PND.    ECG (5/21, personally reviewed): NSR, right axis deviation  [employer] Heartlogic HF index = 0.   Labs (9/17): BNP 477, K 4.5 => 4.2, creatinine 0.92 => 1.1, BNP 1009, HIV negative, SPEP negative.  Labs (10/17): K 5.2, creatinine 1.10, hgb 12.5 Labs (03/28/2016): K 5.3 Creatinine 1.07  Labs (3/18): digoxin < 0.2, K 4.1, creatinine 0.97 Labs (9/18): hgb 12.2, K 4, creatinine 0.99, BNP 139 Labs (1/19): hgb 11.2, K 4.8, creatinine 1.26 Labs (8/20): K 3.7, creatinine 1.69 Labs (5/21): K 5, creatinine 1.4, LDL 192  PMH: 1. HTN 2.  Diabetes 3. Hyperlipidemia 4. Deafness since age 43: Needs sign language interpreter.  5. Chronic systolic CHF: Nonischemic cardiomyopathy.  Diagnosis 10/16 in [place], [place] (admitted with acute systolic CHF).  SPEP and HIV negative.  [place].  - Echo (10/16): EF 15-20%, mild LVH, mild MR, normal RV size and systolic function.  - LHC (10/16): No significant CAD.  - CPX (12/17): Mild functional limitation.  - 04/2016 CMRI: EF 27%. RV normal. No evidence of infiltrative disease, myocarditis, or MI.  - Echo (5/18): EF 25-30%, mild LV dilation, mild LVH, normal RV size with mildly decreased - Echo (9/20): EF 30-35%, mild LVH, diffuse hypokinesis, normal RV size and systolic function.  6. Depression. 7. Colitis episodes.  8. CKD stage 3.  9. DVT/PE in 4/21: No obvious trigger.   SH: From [place] originally.  Lives in [place] until 2017, then moved to [place].  Nonsmoker.  Used to drink 3-4 ETOH beverages/day until 10/16, now stopped. Works in Water engineer at [employer].    FH: No known cardiac disease.   ROS: All systems reviewed and negative except as per HPI.   Current Outpatient Medications  Medication Sig Dispense Refill  . atorvastatin (LIPITOR) 80 MG tablet Take 1 tablet (80 mg total) by mouth daily. 90 tablet 3  . canagliflozin (INVOKANA) 300 MG TABS tablet Take 1 tablet (300 mg total)  by mouth daily before breakfast. 90 tablet 3  . carvedilol (COREG) 12.5 MG tablet Take 1.5 tablets (18.75 mg total) by mouth 2 (two) times daily. 240 tablet 3  . digoxin (LANOXIN) 0.125 MG tablet Take 1 tablet (0.125 mg total) by mouth daily. 90 tablet 3  . furosemide (LASIX) 80 MG tablet TAKE 1 TABLET(80 MG) BY MOUTH TWICE DAILY 180 tablet 0  . hydrALAZINE (APRESOLINE) 25 MG tablet Take 1.5 tablets (37.5 mg total) by mouth 3 (three) times daily. 270 tablet 3  . Insulin Glargine (LANTUS SOLOSTAR) 100 UNIT/ML Solostar Pen Inject 40 Units into the skin daily at 10 pm. (Patient taking  differently: Inject 35 Units into the skin daily at 10 pm. ) 15 pen 3  . metFORMIN (GLUCOPHAGE) 1000 MG tablet Take 1 tablet (1,000 mg total) by mouth 2 (two) times daily with a meal. 180 tablet 3  . sacubitril-valsartan (ENTRESTO) 24-26 MG Take 1 tablet by mouth 2 (two) times daily. 60 tablet 5  . topiramate (TOPAMAX) 50 MG tablet Take 1 tablet, 2 times a day as needed for headache. 60 tablet 2  . traZODone (DESYREL) 100 MG tablet Take 1 tablet (100 mg total) by mouth at bedtime. 30 tablet 3  . Vitamin D, Ergocalciferol, (DRISDOL) 1.25 MG (50000 UNIT) CAPS capsule Take 1 capsule (50,000 Units total) by mouth every 7 (seven) days. 5 capsule 6  . apixaban (ELIQUIS) 5 MG TABS tablet Take 1 tablet (5 mg total) by mouth 2 (two) times daily. 60 tablet 4  . isosorbide mononitrate (IMDUR) 30 MG 24 hr tablet Take 1 tablet (30 mg total) by mouth daily. 90 tablet 3   No current facility-administered medications for this encounter.   BP (!) 160/100   Pulse 81   Ht 5\' 9"  (1.753 m)   Wt 104.2 kg (229 lb 12.8 oz)   SpO2 99%   BMI 33.94 kg/m    Wt Readings from Last 3 Encounters:  09/06/19 104.2 kg (229 lb 12.8 oz)  08/13/19 104.8 kg (231 lb)  01/24/19 105 kg (231 lb 6.4 oz)    General: NAD Neck: No JVD, no thyromegaly or thyroid nodule.  Lungs: Clear to auscultation bilaterally with normal respiratory effort. CV: Nondisplaced PMI.  Heart regular S1/S2, no S3/S4, no murmur.  No peripheral edema.  No carotid bruit.  Normal pedal pulses.  Abdomen: Soft, nontender, no hepatosplenomegaly, no distention.  Skin: Intact without lesions or rashes.  Neurologic: Alert and oriented x 3.  Psych: Normal affect. Extremities: No clubbing or cyanosis.  HEENT: Normal.   Assessment/Plan: 1. Chronic systolic CHF: Nonischemic cardiomyopathy, etiology uncertain. Consider prior ETOH or HTN.  He has a [place].  CMRI in 1/18 with EF 27%, RV normal, no evidence of infiltrative disease, myocarditis, or MI.   Echo in 9/20 showed EF 30-35%.  He is not volume overloaded by exam or Heartlogic.  NYHA class II.  I am concerned that he has not been compliant with his meds.  He is out of Entresto and Imdur, and I am not sure that he has been taking his statin or Crestor.  - Continue lasix 80 mg bid, BMET today. - Restart Entresto 24/26 bid, BMET 10 days. - Continue Coreg 18.75 mg BID      - He is off spironolactone for now with prior hyperkalemia.  Will rechallenge in future with use of Lokelma or Veltasssa.    - Continue hydralazine 37.5 mg tid and restart Imdur 30 daily. - Continue digoxin  0.125 daily, check digoxin level today (?taking). 2. Type II diabetes: He is on canigliflozin.  3. HTN: BP elevated, adding back Entresto.   4. Deafness: Sign language interpreter present at visit.  5. CKD: Stage 3.  Follow creatinine closely with addition of Entresto, BMET today and again in 10 days.  6. PE/DVT: 4/21.  Unclear cause, no prolonged trip, prolonged immobility, or history of malignancy. No FH clotting. He only took Eliquis for 2 weeks (stopped when he ran out of the starter pack).  - Restart apixaban, think he will need long-term with no clear trigger for VTE.  - He can stop ASA when apixaban is started.  7. Hyperlipidemia: LDL very high when last checked, not clear that he is taking statin.  Goal LDL < 70 with diabetes.  - Check lipids again today, if LDL is high will need to switch to Crestor versus add Repatha.   Followup with HF pharmacist in 3 wks for med titration and see NP/PA in 6 wks.  Ideally would have paramedicine follow him given concerns about compliance, but sign language need is limiting.   Loralie Champagne, MD  09/08/2019

## 2019-09-11 ENCOUNTER — Encounter (HOSPITAL_COMMUNITY): Payer: Self-pay

## 2019-09-11 ENCOUNTER — Telehealth (HOSPITAL_COMMUNITY): Payer: Self-pay

## 2019-09-11 DIAGNOSIS — E7800 Pure hypercholesterolemia, unspecified: Secondary | ICD-10-CM

## 2019-09-11 DIAGNOSIS — I5022 Chronic systolic (congestive) heart failure: Secondary | ICD-10-CM

## 2019-09-11 MED ORDER — ICOSAPENT ETHYL 1 G PO CAPS
2.0000 g | ORAL_CAPSULE | Freq: Two times a day (BID) | ORAL | 5 refills | Status: DC
Start: 2019-09-11 — End: 2020-07-06

## 2019-09-11 NOTE — Telephone Encounter (Signed)
As patient did not return call to office, message below sent to patient via mychart. Awaiting response  Based on recent lab results, your cholesterol panel is abnormal. Dr Aundra Dubin would like to start you on a new medication called Vascepa 2gm twice a day.  He also recommended you going to the lipid clinic where they will help to manage cholesterol better.  I will order this new medication and send referral for lipid clinic. They will call you to schedule an appointment. Please respond to confirm you received this message and understand instructions.

## 2019-09-11 NOTE — Telephone Encounter (Signed)
-----   Message from Larey Dresser, MD sent at 09/08/2019 11:21 PM EDT ----- LDL also too high.  Would refer to lipid clinic given markedly high TGs, also need to add Repatha to his statin with LDL > 70.

## 2019-09-11 NOTE — Telephone Encounter (Signed)
-----   Message from Larey Dresser, MD sent at 09/06/2019  2:14 PM EDT ----- Very high triglycerides.  Start Vascepa 2 g bid.  May need to get Lauren or Kathlee Nations to help with this.

## 2019-09-11 NOTE — Telephone Encounter (Signed)
-----   Message from Charlann Boxer, CPhT sent at 09/10/2019 10:31 AM EDT ----- Marykay Lex there, insurance wants brand name Vascepa, co-pay is $1.30 for 30 days Should have no issues with affordability.

## 2019-09-12 ENCOUNTER — Encounter (HOSPITAL_COMMUNITY): Payer: Self-pay

## 2019-09-12 NOTE — Telephone Encounter (Signed)
-----   Message from Larey Dresser, MD sent at 09/06/2019  2:14 PM EDT ----- Very high triglycerides.  Start Vascepa 2 g bid.  May need to get Lauren or Kathlee Nations to help with this.

## 2019-09-16 ENCOUNTER — Telehealth (HOSPITAL_COMMUNITY): Payer: Self-pay | Admitting: Vascular Surgery

## 2019-09-16 ENCOUNTER — Telehealth (HOSPITAL_COMMUNITY): Payer: Self-pay | Admitting: *Deleted

## 2019-09-16 ENCOUNTER — Ambulatory Visit (HOSPITAL_COMMUNITY)
Admission: RE | Admit: 2019-09-16 | Discharge: 2019-09-16 | Disposition: A | Discharge status: Home / Self Care | Payer: Medicare Other | Source: Ambulatory Visit | Attending: Internal Medicine | Admitting: Internal Medicine

## 2019-09-16 ENCOUNTER — Other Ambulatory Visit: Payer: Self-pay

## 2019-09-16 DIAGNOSIS — I5022 Chronic systolic (congestive) heart failure: Secondary | ICD-10-CM | POA: Diagnosis not present

## 2019-09-16 LAB — BASIC METABOLIC PANEL
Anion gap: 14 (ref 5–15)
BUN: 18 mg/dL (ref 6–20)
CO2: 27 mmol/L (ref 22–32)
Calcium: 9 mg/dL (ref 8.9–10.3)
Chloride: 92 mmol/L — ABNORMAL LOW (ref 98–111)
Creatinine, Ser: 1.63 mg/dL — ABNORMAL HIGH (ref 0.61–1.24)
GFR calc Af Amer: 56 mL/min — ABNORMAL LOW (ref >60)
GFR calc non Af Amer: 48 mL/min — ABNORMAL LOW (ref >60)
Glucose, Bld: 213 mg/dL — ABNORMAL HIGH (ref 70–99)
Potassium: 4.5 mmol/L (ref 3.5–5.1)
Sodium: 133 mmol/L — ABNORMAL LOW (ref 135–145)

## 2019-09-16 NOTE — Telephone Encounter (Signed)
Pt in for lab work today, I communicated with him via writing, he states he did not see the mychart message about lab results. Info printed out, new med info highlighted, pt verbalized understanding with thumbs up.

## 2019-09-16 NOTE — Telephone Encounter (Signed)
Left pt message to move 7/12 appt

## 2019-09-23 ENCOUNTER — Ambulatory Visit (INDEPENDENT_AMBULATORY_CARE_PROVIDER_SITE_OTHER): Payer: Medicare Other | Admitting: *Deleted

## 2019-09-23 DIAGNOSIS — I428 Other cardiomyopathies: Secondary | ICD-10-CM | POA: Diagnosis not present

## 2019-09-23 NOTE — Progress Notes (Unsigned)
Patient ID: Troy Rush                 DOB: 1970/10/03                    MRN: [id number]     HPI: WILLEM KLINGENSMITH is a 49 y.o. male patient referred to lipid clinic by ***. PMH is significant for   Current Medications:  Intolerances:  Risk Factors:  LDL goal:   Diet:   Exercise:   Family History:   Social History:   Labs:  Past Medical History:  Diagnosis Date  . AICD (automatic cardioverter/defibrillator) present 10/14/2016  . Bilateral deafness   . Bronchitis   . CHF (congestive heart failure) ([place])   . Depression   . Diabetes mellitus without complication ([place])   . DVT, lower extremity ([place]) 07/12/2019  . Dyslipidemia   . Headache 08/2019  . Hypertension   . Insomnia 08/2019  . NICM (nonischemic cardiomyopathy) ([place]) 09/15/2016  . Pancreatitis   . Vitamin D deficiency 08/2019    Current Outpatient Medications on File Prior to Visit  Medication Sig Dispense Refill  . apixaban (ELIQUIS) 5 MG TABS tablet Take 1 tablet (5 mg total) by mouth 2 (two) times daily. 60 tablet 4  . atorvastatin (LIPITOR) 80 MG tablet Take 1 tablet (80 mg total) by mouth daily. 90 tablet 3  . canagliflozin (INVOKANA) 300 MG TABS tablet Take 1 tablet (300 mg total) by mouth daily before breakfast. 90 tablet 3  . carvedilol (COREG) 12.5 MG tablet Take 1.5 tablets (18.75 mg total) by mouth 2 (two) times daily. 240 tablet 3  . digoxin (LANOXIN) 0.125 MG tablet Take 1 tablet (0.125 mg total) by mouth daily. 90 tablet 3  . furosemide (LASIX) 80 MG tablet TAKE 1 TABLET(80 MG) BY MOUTH TWICE DAILY 180 tablet 0  . hydrALAZINE (APRESOLINE) 25 MG tablet Take 1.5 tablets (37.5 mg total) by mouth 3 (three) times daily. 270 tablet 3  . icosapent Ethyl (VASCEPA) 1 g capsule Take 2 capsules (2 g total) by mouth 2 (two) times daily. 120 capsule 5  . Insulin Glargine (LANTUS SOLOSTAR) 100 UNIT/ML Solostar Pen Inject 40 Units into the skin daily at 10 pm. (Patient taking differently: Inject 35 Units into the  skin daily at 10 pm. ) 15 pen 3  . isosorbide mononitrate (IMDUR) 30 MG 24 hr tablet Take 1 tablet (30 mg total) by mouth daily. 90 tablet 3  . metFORMIN (GLUCOPHAGE) 1000 MG tablet Take 1 tablet (1,000 mg total) by mouth 2 (two) times daily with a meal. 180 tablet 3  . sacubitril-valsartan (ENTRESTO) 24-26 MG Take 1 tablet by mouth 2 (two) times daily. 60 tablet 5  . topiramate (TOPAMAX) 50 MG tablet Take 1 tablet, 2 times a day as needed for headache. 60 tablet 2  . traZODone (DESYREL) 100 MG tablet Take 1 tablet (100 mg total) by mouth at bedtime. 30 tablet 3  . Vitamin D, Ergocalciferol, (DRISDOL) 1.25 MG (50000 UNIT) CAPS capsule Take 1 capsule (50,000 Units total) by mouth every 7 (seven) days. 5 capsule 6   No current facility-administered medications on file prior to visit.    Allergies  Allergen Reactions  . Lisinopril Cough    Assessment/Plan:  1. Hyperlipidemia -

## 2019-09-24 ENCOUNTER — Ambulatory Visit: Payer: Medicare Other

## 2019-09-24 LAB — CUP PACEART REMOTE DEVICE CHECK
Battery Remaining Longevity: 162 mo
Battery Remaining Percentage: 100 %
Brady Statistic RV Percent Paced: 0 %
Date Time Interrogation Session: 20210614004100
HighPow Impedance: 61 Ohm
Implantable Lead Implant Date: 20180706
Implantable Lead Location: 753860
Implantable Lead Model: 293
Implantable Lead Serial Number: 432676
Implantable Pulse Generator Implant Date: 20180706
Lead Channel Impedance Value: 416 Ohm
Lead Channel Setting Pacing Amplitude: 2.5 V
Lead Channel Setting Pacing Pulse Width: 0.4 ms
Lead Channel Setting Sensing Sensitivity: 0.5 mV
Pulse Gen Serial Number: 236570

## 2019-09-25 NOTE — Progress Notes (Signed)
Remote ICD transmission.   

## 2019-09-25 NOTE — Progress Notes (Signed)
PCP: Cammie Sickle Cardiology: Dr. Aundra Dubin  HPI:  49 y.o. with deafness, HTN, DM, and chronic systolic CHF presents for followup of CHF.  Patient had no known cardiac problems until 10/16.  At that time, he developed exertional dyspnea and was admitted to a hospital in [place], [place] with acute systolic CHF.  Echo 10/16 showed EF 15-20%.  Cardiac cath showed no significant coronary disease.  He was diuresed and started on cardiac meds. Subsequently, he moved to [facility].  Now working in a [employer].  Repeat echo in 5/18 showed persistently low EF, 25-30%.  He had [place] placed.   He was admitted briefly with CHF exacerbation in 8/20.   Echo in 9/20 showed EF 30-35% with diffuse hypokinesis.   Ultrasound in 4/21 showed right leg DVT and CT showed a PE.  No obvious trigger (no long trip, no surgery, no prolonged immobility).  He was started on apixaban but only took the 2 week starter pack and had not had any since then.   Recently presented to HF Clinic for follow up with Dr. Aundra Dubin on 08/17/19. Sign language interpreter was present.  He was out of Entresto.  It was unclear how well he was taking his other meds.  LDL was very high earlier this month but he stated that he was taking his statin.  Digoxin level had been < 0.2 ng/mL but he stated that he had been taking digoxin.  No chest pain.  He was not short of breath walking on flat ground or up 1 flight of stairs.  No orthopnea/PND.     Today he returns to HF clinic for pharmacist medication titration. At last visit with MD, [name], isosorbide mononitrate (Imdur) and apixaban were restarted and Vascepa was initiated. Here with sign-language interpreter today. Overall he is feeling poorly today. Says he has felt bad since approximately 2-3 days after his HF medications were restarted. He says he is extremely dizzy and has been falling. Has also been nauseous which occurs about an hour after taking his medications and he has  vomited a few times. Says he feels weak and fatigued. No fever and has been fully vaccinated. No chest pains or palpitations. No SOB/DOE. He does not weigh himself daily at home. Weight is down 13 lbs since last clinic visit. No LEE, PND or orthopnea. Appetite is poor given recent nausea. He is very concerned about the amount of medications and wishes to take less.    HF Medications: Carvedilol 18.75 mg BID Entresto 24/26 mg BID Digoxin 0.125 mg daily Hydralazine 37.5 mg TID Isosorbide mononitrate 30 mg daily Canagliflozin 300 mg daily Furosemide 80 mg twice daily  Has the patient been experiencing any side effects to the medications prescribed?  Yes - has been dizzy and fatigued since restarting HF medications. Also complains of nausea.   Does the patient have any problems obtaining medications due to transportation or finances?   No - has [employer]  Understanding of regimen: fair Understanding of indications: fair Potential of compliance: fair Patient understands to avoid NSAIDs. Patient understands to avoid decongestants.    Pertinent Lab Values: . Serum creatinine 1.63, BUN 18, Potassium 4.5, Sodium 133  Vital Signs: . Weight: 217.4 lbs (last clinic weight: 229.8 lbs) . Blood pressure: 104/78  . Heart rate: 84   Assessment: 1. Chronic systolic CHF: Nonischemic cardiomyopathy, etiology uncertain. Consider prior ETOH or HTN.  He has a [place].  CMRI in 1/18 with EF 27%, RV normal, no  evidence of infiltrative disease, myocarditis, or MI.  Echo in 9/20 showed EF 30-35%.  He is not volume overloaded by exam or [facility].   - NYHA class II. Dry on exam.  - Vitals: BP 104/78, HR 84.  - Complains of dizziness/falls, fatigue and nausea that he attributes to recent resumption of medications. We discussed finding a balance of medications that will work to make his heart stronger but not make him feel poorly. Suspect any increases in medications will need to be done  slowly. Previous noncompliance likely has led to him being on doses that are too large.  - Hold furosemide tonight and tomorrow, then decrease furosemide to 80 mg every AM and 40 mg every PM. Changed furosemide tablets to 40 mg strength per patient request as he does not have a pill cutter.  - Continue carvedilol 18.75 mg BID      - Continue Entresto 24/26 mg BID. - He is off spironolactone for now with prior hyperkalemia.  Will rechallenge in future with use of Lokelma or Veltasssa.    - Stop hydralazine for now. Would rechallenge if BP improves after decrease in furosemide.  - Continue Imdur 30 daily. - Continue digoxin 0.125 mg daily 2. Type II diabetes: He is on canigliflozin.  3. HTN: BP elevated, adding back Entresto.   4. Deafness: Sign language interpreter present at visit.  5. CKD: Stage 3.  Scr 1.63 on last BMET 6. PE/DVT: 4/21.  Unclear cause, no prolonged trip, prolonged immobility, or history of malignancy. No FH clotting. He only took Eliquis for 2 weeks (stopped when he ran out of the starter pack).  - Continue apixaban, think he will need long-term with no clear trigger for VTE.  - Off ASA while on apixaban  7. Hyperlipidemia: -Lipid panel 09/06/19. TC 315, direct LDL 111.1, TG 542 -Goal LDL < 70 with diabetes.  - Continue atorvastatin 80 mg and Vascepa.  - Followed by Lipid Clinic   Plan: 1) Medication changes: Based on clinical presentation, vital signs and recent labs will hold furosemide tonight and tomorrow, then decrease furosemide to 80 mg every AM and 40 mg every PM. Stop hydralazine.  2) Follow-up: 4 weeks with [facility]   Audry Riles, PharmD, BCPS, BCCP, CPP Heart Failure Clinic Pharmacist [phone]

## 2019-09-30 ENCOUNTER — Other Ambulatory Visit (HOSPITAL_COMMUNITY): Payer: Self-pay | Admitting: *Deleted

## 2019-10-01 ENCOUNTER — Ambulatory Visit (INDEPENDENT_AMBULATORY_CARE_PROVIDER_SITE_OTHER): Payer: Medicare Other | Admitting: Pharmacist

## 2019-10-01 ENCOUNTER — Other Ambulatory Visit: Payer: Self-pay | Admitting: Family Medicine

## 2019-10-01 ENCOUNTER — Encounter: Payer: Self-pay | Admitting: Pharmacist

## 2019-10-01 ENCOUNTER — Other Ambulatory Visit: Payer: Self-pay

## 2019-10-01 VITALS — BP 124/82

## 2019-10-01 DIAGNOSIS — E7800 Pure hypercholesterolemia, unspecified: Secondary | ICD-10-CM

## 2019-10-01 DIAGNOSIS — E118 Type 2 diabetes mellitus with unspecified complications: Secondary | ICD-10-CM

## 2019-10-01 NOTE — Progress Notes (Signed)
Patient ID: Troy Rush                 DOB: 1970/05/24                    MRN: [id number]     HPI: Troy Rush is a 49 y.o. male patient referred to lipid clinic by Dr Aundra Dubin. Patient is deaf and presents with a sign language interpreter.  PMH is significant for CHF, DM, hx of DVT, and HLD.  Presented to cardiology and saw Dr. Aundra Dubin on 09/06/19.  Has follow up scheduled in HF clinic in 1 week.  Lipid panel run at previous visit.  Trigs > 542, LDL unable to calculate under Friedewald equation, total cholesterol 315. Direct LDL 111.1.  There were questions about medication compliance.  Vascepa was prescribed but patient did not pick up.  Has medicaid and medicare.    Reports he drinks 2-3 beers approximately a couple times a week.  Denies tobacco use.  Former [employer], now unemployed.  Reports his diet consists of beans, goat, and African food.  Makes most meals at home, but they are generally fried.  Rarely eats out.  Reports he does not eat much fruits or vegetables.  Current Medications: atorvastatin 80 mg Intolerances: lisinopril (cough) Risk Factors: DM (uncontrolled), HTN, CHF, diet, Triglycerides LDL goal: <55  Family History:  unknown  Past Medical History:  Diagnosis Date  . AICD (automatic cardioverter/defibrillator) present 10/14/2016  . Bilateral deafness   . Bronchitis   . CHF (congestive heart failure) ([place])   . Depression   . Diabetes mellitus without complication ([place])   . DVT, lower extremity ([place]) 07/12/2019  . Dyslipidemia   . Headache 08/2019  . Hypertension   . Insomnia 08/2019  . NICM (nonischemic cardiomyopathy) ([place]) 09/15/2016  . Pancreatitis   . Vitamin D deficiency 08/2019    Current Outpatient Medications on File Prior to Visit  Medication Sig Dispense Refill  . apixaban (ELIQUIS) 5 MG TABS tablet Take 1 tablet (5 mg total) by mouth 2 (two) times daily. 60 tablet 4  . atorvastatin (LIPITOR) 80 MG tablet Take 1 tablet (80 mg total) by mouth daily.  90 tablet 3  . canagliflozin (INVOKANA) 300 MG TABS tablet Take 1 tablet (300 mg total) by mouth daily before breakfast. 90 tablet 3  . carvedilol (COREG) 12.5 MG tablet Take 1.5 tablets (18.75 mg total) by mouth 2 (two) times daily. 240 tablet 3  . digoxin (LANOXIN) 0.125 MG tablet Take 1 tablet (0.125 mg total) by mouth daily. 90 tablet 3  . furosemide (LASIX) 80 MG tablet TAKE 1 TABLET(80 MG) BY MOUTH TWICE DAILY 180 tablet 0  . hydrALAZINE (APRESOLINE) 25 MG tablet Take 1.5 tablets (37.5 mg total) by mouth 3 (three) times daily. 270 tablet 3  . icosapent Ethyl (VASCEPA) 1 g capsule Take 2 capsules (2 g total) by mouth 2 (two) times daily. 120 capsule 5  . Insulin Glargine (LANTUS SOLOSTAR) 100 UNIT/ML Solostar Pen Inject 40 Units into the skin daily at 10 pm. (Patient taking differently: Inject 35 Units into the skin daily at 10 pm. ) 15 pen 3  . isosorbide mononitrate (IMDUR) 30 MG 24 hr tablet Take 1 tablet (30 mg total) by mouth daily. 90 tablet 3  . metFORMIN (GLUCOPHAGE) 1000 MG tablet Take 1 tablet (1,000 mg total) by mouth 2 (two) times daily with a meal. 180 tablet 3  . sacubitril-valsartan (ENTRESTO) 24-26 MG Take 1 tablet  by mouth 2 (two) times daily. 60 tablet 5  . topiramate (TOPAMAX) 50 MG tablet Take 1 tablet, 2 times a day as needed for headache. 60 tablet 2  . traZODone (DESYREL) 100 MG tablet Take 1 tablet (100 mg total) by mouth at bedtime. 30 tablet 3  . Vitamin D, Ergocalciferol, (DRISDOL) 1.25 MG (50000 UNIT) CAPS capsule Take 1 capsule (50,000 Units total) by mouth every 7 (seven) days. 5 capsule 6   No current facility-administered medications on file prior to visit.    Allergies  Allergen Reactions  . Lisinopril Cough    Assessment/Plan:  1. Hyperlipidemia - LDL > 55 which is above goal.  Patient at high risk for CV events and requires education regarding lifestyle, medication compliance, disease states, and complication prevention.  Patient reported compliance  with all medications, but could not firmly explain when he was taking them.  Also did not know he was on any medications that could affect blood pressure.    Explained to patient the pathophysiology behind cholesterol synthesis and possible complications of uncontrolled cholesterol and triglycerides.  Used models to show patient how plaque formed and how blood flow can be hindered.  Used models to show the difference between blood serum with very low triglycerides versus very high triglycerides.  Explained that complications of elevated cholesterol/triglycerides can lead to events such as MI and stroke.  Patient acknowledged understanding.  Counseled patient he should work on diet changes and increase consumption of fresh vegetables and fruits as well as whole grains and lean proteins.  Gave multiple diet handouts showing which foods are more likely to increase cholesterol levels and which are not.  Recommended patient decrease the amount of fried and processed foods he is eating.    Patient also limited by insurance coverage.  Needs >50% reduction in LDL levels and significant decrease in triglycerides.  Unclear if patient did not pick up Vascepa due to lack of coverage or not.  Recommended he start Vascepa at 2 grams BID and if medicaid will not cover, to start with OTC fish oil until we can process PA.  Next step would be PCSK9i but insurance coverage will be challenging.  Scheduled patient for lipid panel in 2 months and follow up one week later.  Continue atorvastatin 80 mg once daily.  Patient voiced understanding.  Karren Cobble, PharmD, BCACP, [place] [address], [place], [place] [address] Phone: [phone]; Fax: [phone] 10/01/2019 12:28 PM

## 2019-10-01 NOTE — Patient Instructions (Signed)
It was nice meeting you today.  Remember to start making diet changes.  Begin to eat more fresh fruits and vegetables, whole grains, and lean proteins such as fish and chicken.  Continue to take your atorvastatin 80 mg once daily.  Pick up your Vascepa from the pharmacy and take 2 capsules by mouth twice a day  Please call with any questions  Karren Cobble, PharmD, [place], [place] [address], [place], [place] [address] Phone: [phone]; Fax: [phone] 10/01/2019 11:08 AM

## 2019-10-07 ENCOUNTER — Ambulatory Visit (HOSPITAL_COMMUNITY)
Admission: RE | Admit: 2019-10-07 | Discharge: 2019-10-07 | Disposition: A | Discharge status: Home / Self Care | Payer: Medicare Other | Source: Ambulatory Visit | Attending: Internal Medicine | Admitting: Internal Medicine

## 2019-10-07 ENCOUNTER — Other Ambulatory Visit: Payer: Self-pay

## 2019-10-07 VITALS — BP 104/78 | HR 84 | Wt 217.4 lb

## 2019-10-07 DIAGNOSIS — Z7901 Long term (current) use of anticoagulants: Secondary | ICD-10-CM | POA: Diagnosis not present

## 2019-10-07 DIAGNOSIS — Z7984 Long term (current) use of oral hypoglycemic drugs: Secondary | ICD-10-CM | POA: Diagnosis not present

## 2019-10-07 DIAGNOSIS — I428 Other cardiomyopathies: Secondary | ICD-10-CM | POA: Diagnosis not present

## 2019-10-07 DIAGNOSIS — Z86718 Personal history of other venous thrombosis and embolism: Secondary | ICD-10-CM | POA: Insufficient documentation

## 2019-10-07 DIAGNOSIS — I130 Hypertensive heart and chronic kidney disease with heart failure and stage 1 through stage 4 chronic kidney disease, or unspecified chronic kidney disease: Secondary | ICD-10-CM | POA: Insufficient documentation

## 2019-10-07 DIAGNOSIS — Z79899 Other long term (current) drug therapy: Secondary | ICD-10-CM | POA: Diagnosis not present

## 2019-10-07 DIAGNOSIS — H9190 Unspecified hearing loss, unspecified ear: Secondary | ICD-10-CM | POA: Diagnosis not present

## 2019-10-07 DIAGNOSIS — Z86711 Personal history of pulmonary embolism: Secondary | ICD-10-CM | POA: Diagnosis not present

## 2019-10-07 DIAGNOSIS — N183 Chronic kidney disease, stage 3 unspecified: Secondary | ICD-10-CM | POA: Insufficient documentation

## 2019-10-07 DIAGNOSIS — I110 Hypertensive heart disease with heart failure: Secondary | ICD-10-CM | POA: Diagnosis present

## 2019-10-07 DIAGNOSIS — I5022 Chronic systolic (congestive) heart failure: Secondary | ICD-10-CM | POA: Insufficient documentation

## 2019-10-07 DIAGNOSIS — Z95810 Presence of automatic (implantable) cardiac defibrillator: Secondary | ICD-10-CM | POA: Insufficient documentation

## 2019-10-07 DIAGNOSIS — E1122 Type 2 diabetes mellitus with diabetic chronic kidney disease: Secondary | ICD-10-CM | POA: Diagnosis not present

## 2019-10-07 DIAGNOSIS — E785 Hyperlipidemia, unspecified: Secondary | ICD-10-CM | POA: Insufficient documentation

## 2019-10-07 MED ORDER — FUROSEMIDE 40 MG PO TABS: ORAL_TABLET | ORAL | 3 refills | Status: DC

## 2019-10-07 NOTE — Patient Instructions (Addendum)
It was a pleasure seeing you today!  MEDICATIONS: -We are changing your medications today -Hold furosemide tonight and tomorrow, then decrease furosemide to 80 mg (2 tabs) every morning and 40 mg (1 tab) every evening. -Stop taking hydralazine. -Call if you have questions about your medications.  LABS: -We will call you if your labs need attention.  NEXT APPOINTMENT: Return to clinic in 4 weeks with [facility].  In general, to take care of your heart failure:d -Limit your fluid intake to 2 Liters (half-gallon) per day.   -Limit your salt intake to ideally 2-3 grams (2000-3000 mg) per day. -Weigh yourself daily and record, and bring that "weight diary" to your next appointment.  (Weight gain of 2-3 pounds in 1 day typically means fluid weight.) -The medications for your heart are to help your heart and help you live longer.   -Please contact us before stopping any of your heart medications.  Call the clinic at [phone] with questions or to reschedule future appointments.

## 2019-10-21 ENCOUNTER — Encounter (HOSPITAL_COMMUNITY): Payer: Medicare Other

## 2019-10-31 ENCOUNTER — Other Ambulatory Visit: Payer: Self-pay | Admitting: Family Medicine

## 2019-10-31 DIAGNOSIS — R519 Headache, unspecified: Secondary | ICD-10-CM

## 2019-11-04 ENCOUNTER — Encounter (HOSPITAL_COMMUNITY): Payer: Medicare Other

## 2019-11-12 ENCOUNTER — Ambulatory Visit: Payer: Medicare Other | Admitting: Family Medicine

## 2019-12-03 ENCOUNTER — Other Ambulatory Visit: Payer: Medicare Other

## 2019-12-09 ENCOUNTER — Other Ambulatory Visit: Payer: Self-pay

## 2019-12-09 ENCOUNTER — Other Ambulatory Visit: Payer: Medicare Other | Admitting: *Deleted

## 2019-12-09 DIAGNOSIS — E7800 Pure hypercholesterolemia, unspecified: Secondary | ICD-10-CM | POA: Diagnosis not present

## 2019-12-09 LAB — LIPID PANEL
Chol/HDL Ratio: 15 ratio — ABNORMAL HIGH (ref 0.0–5.0)
Cholesterol, Total: 480 mg/dL — ABNORMAL HIGH (ref 100–199)
HDL: 32 mg/dL — ABNORMAL LOW (ref >39)
Triglycerides: 2601 mg/dL (ref 0–149)

## 2019-12-10 ENCOUNTER — Ambulatory Visit: Payer: Medicare Other

## 2019-12-11 ENCOUNTER — Telehealth: Payer: Self-pay | Admitting: Pharmacist

## 2019-12-11 DIAGNOSIS — E781 Pure hyperglyceridemia: Secondary | ICD-10-CM

## 2019-12-11 MED ORDER — FENOFIBRATE 54 MG PO TABS: 54.0000 mg | ORAL_TABLET | Freq: Every day | ORAL | 2 refills | Status: DC

## 2019-12-11 NOTE — Telephone Encounter (Signed)
Patient with very high triglyceride levels.  Sent Mychart message that we will start fenofibrate once daily

## 2019-12-12 ENCOUNTER — Ambulatory Visit: Payer: Medicare Other

## 2019-12-17 ENCOUNTER — Ambulatory Visit (INDEPENDENT_AMBULATORY_CARE_PROVIDER_SITE_OTHER): Payer: Medicare Other | Admitting: Pharmacist

## 2019-12-17 ENCOUNTER — Other Ambulatory Visit: Payer: Self-pay

## 2019-12-17 VITALS — BP 190/112 | Wt 228.2 lb

## 2019-12-17 DIAGNOSIS — E781 Pure hyperglyceridemia: Secondary | ICD-10-CM

## 2019-12-17 NOTE — Patient Instructions (Addendum)
It was good seeing you today!  Your blood pressure and cholesterol are very elevated.  Please restart all your medications  Continue to focus on eating fruits and vegetables and use healthy unsaturated fats  Please call with any questions   Karren Cobble, PharmD, BCACP, [place] [address] [place], [place] [address] Phone: [phone]; Fax: [phone] 12/17/2019 9:38 AM

## 2019-12-17 NOTE — Progress Notes (Signed)
Patient ID: KHY PITRE                 DOB: June 09, 1970                    MRN: [id number]    HPI:  Troy Rush is a 49 y.o. male patient referred to lipid clinic by Dr Aundra Dubin. Patient is deaf and presents with a sign language interpreter.  PMH is significant for CHF, DM, hx of DVT, and HLD. Also follows up in CHF clinic.  Patient presents today for 3 month follow up after having lipid panel completed.  Previously has been educated on cholesterol synthesis, causes of LDL increases and possible complications. However lipid levels still extremely elevated.  TC 480, Trigs 2601, HDL 32.  Upon questioning patient reports he has stopped taking his meds about 2 weeks ago due to depression.  Reports his wife left him and he has been drinking a lot of alcohol (multiple large glasses of liquor).  Has adjusted diet however.  Reports he is not using salt and is avoiding meats.  Has been eating more fruits and vegetables.  Due to elevated triglycerides, patient was prescribed fenofibrate in addition to Vascepa but he has not picked up from his pharmacy.  Current Medications: Atorvastatin 80, fenofibrate 54 mg, Vascepa 2 grams BID Intolerances: N/A Risk Factors: DM, CHF LDL goal: <55    Past Medical History:  Diagnosis Date  . AICD (automatic cardioverter/defibrillator) present 10/14/2016  . Bilateral deafness   . Bronchitis   . CHF (congestive heart failure) ([place])   . Depression   . Diabetes mellitus without complication ([place])   . DVT, lower extremity ([place]) 07/12/2019  . Dyslipidemia   . Headache 08/2019  . Hypertension   . Insomnia 08/2019  . NICM (nonischemic cardiomyopathy) ([place]) 09/15/2016  . Pancreatitis   . Vitamin D deficiency 08/2019    Current Outpatient Medications on File Prior to Visit  Medication Sig Dispense Refill  . apixaban (ELIQUIS) 5 MG TABS tablet Take 1 tablet (5 mg total) by mouth 2 (two) times daily. 60 tablet 4  . atorvastatin (LIPITOR) 80 MG tablet TAKE 1  TABLET(80 MG) BY MOUTH DAILY 90 tablet 3  . carvedilol (COREG) 12.5 MG tablet Take 1.5 tablets (18.75 mg total) by mouth 2 (two) times daily. 240 tablet 3  . citalopram (CELEXA) 40 MG tablet Take 40 mg by mouth daily.    . digoxin (LANOXIN) 0.125 MG tablet Take 1 tablet (0.125 mg total) by mouth daily. 90 tablet 3  . fenofibrate 54 MG tablet Take 1 tablet (54 mg total) by mouth daily. 30 tablet 2  . furosemide (LASIX) 40 MG tablet Take 80 mg (2 tablet) every morning and 40 mg (1 tablet) every evening 270 tablet 3  . icosapent Ethyl (VASCEPA) 1 g capsule Take 2 capsules (2 g total) by mouth 2 (two) times daily. 120 capsule 5  . Insulin Glargine (LANTUS SOLOSTAR) 100 UNIT/ML Solostar Pen Inject 40 Units into the skin daily at 10 pm. (Patient taking differently: Inject 35 Units into the skin daily at 10 pm. ) 15 pen 3  . INVOKANA 300 MG TABS tablet TAKE 1 TABLET(300 MG) BY MOUTH DAILY BEFORE BREAKFAST 90 tablet 3  . isosorbide mononitrate (IMDUR) 30 MG 24 hr tablet Take 1 tablet (30 mg total) by mouth daily. 90 tablet 3  . metFORMIN (GLUCOPHAGE) 1000 MG tablet TAKE 1 TABLET(1000 MG) BY MOUTH TWICE DAILY WITH A MEAL 180  tablet 3  . sacubitril-valsartan (ENTRESTO) 24-26 MG Take 1 tablet by mouth 2 (two) times daily. 60 tablet 5  . topiramate (TOPAMAX) 50 MG tablet TAKE 1 TABLET BY MOUTH TWICE DAILY AS NEEDED FOR HEADACHE 60 tablet 2  . traZODone (DESYREL) 100 MG tablet Take 1 tablet (100 mg total) by mouth at bedtime. 30 tablet 3  . Vitamin D, Ergocalciferol, (DRISDOL) 1.25 MG (50000 UNIT) CAPS capsule Take 1 capsule (50,000 Units total) by mouth every 7 (seven) days. 5 capsule 6   No current facility-administered medications on file prior to visit.    Allergies  Allergen Reactions  . Lisinopril Cough    Assessment/Plan:  1. Hyperlipidemia - Patient triglycerides extremely elevated likely due to medication non compliance and alcohol consumption.  Believes he has a history of pancreatitis from  [place].  Blood pressure also elevated today likely due to medication noncompliance.  Consulted with Dr Meda Coffee who did not think BP needed to be treated at this time in office and he should take his medications when he gets home.  Recommended patient immediately restart medications to reduce risks of complications.  Instructed patient to pick up fenofibrate from pharmacy and continue diet changes.  Recommended patient decrease his alcohol intake and reach out to PCP for pysch/therapy referral since patient expresses depression and is causing him to drink heavily and not take his medications.  Karren Cobble, PharmD, BCACP, [place] [address], [place], [place] [address] Phone: [phone]; Fax: [phone] 12/17/2019 10:05 AM

## 2019-12-23 ENCOUNTER — Ambulatory Visit (INDEPENDENT_AMBULATORY_CARE_PROVIDER_SITE_OTHER): Payer: Medicare Other | Admitting: *Deleted

## 2019-12-23 DIAGNOSIS — I428 Other cardiomyopathies: Secondary | ICD-10-CM

## 2019-12-25 LAB — CUP PACEART REMOTE DEVICE CHECK
Battery Remaining Longevity: 156 mo
Battery Remaining Percentage: 100 %
Brady Statistic RV Percent Paced: 0 %
Date Time Interrogation Session: 20210913004200
HighPow Impedance: 63 Ohm
Implantable Lead Implant Date: 20180706
Implantable Lead Location: 753860
Implantable Lead Model: 293
Implantable Lead Serial Number: 432676
Implantable Pulse Generator Implant Date: 20180706
Lead Channel Impedance Value: 423 Ohm
Lead Channel Setting Pacing Amplitude: 2.5 V
Lead Channel Setting Pacing Pulse Width: 0.4 ms
Lead Channel Setting Sensing Sensitivity: 0.5 mV
Pulse Gen Serial Number: 236570

## 2019-12-25 NOTE — Progress Notes (Signed)
Remote ICD transmission.   

## 2019-12-31 ENCOUNTER — Ambulatory Visit: Payer: Medicare Other | Admitting: Family Medicine

## 2020-01-07 ENCOUNTER — Ambulatory Visit: Payer: Medicare Other | Admitting: Family Medicine

## 2020-01-20 DIAGNOSIS — H40013 Open angle with borderline findings, low risk, bilateral: Secondary | ICD-10-CM | POA: Diagnosis not present

## 2020-01-20 DIAGNOSIS — H40053 Ocular hypertension, bilateral: Secondary | ICD-10-CM | POA: Diagnosis not present

## 2020-01-20 DIAGNOSIS — H524 Presbyopia: Secondary | ICD-10-CM | POA: Diagnosis not present

## 2020-01-20 DIAGNOSIS — E113293 Type 2 diabetes mellitus with mild nonproliferative diabetic retinopathy without macular edema, bilateral: Secondary | ICD-10-CM | POA: Diagnosis not present

## 2020-01-31 ENCOUNTER — Encounter (HOSPITAL_COMMUNITY): Payer: Self-pay

## 2020-01-31 ENCOUNTER — Other Ambulatory Visit: Payer: Self-pay

## 2020-01-31 ENCOUNTER — Inpatient Hospital Stay (HOSPITAL_COMMUNITY)
Admission: EM | Admit: 2020-01-31 | Discharge: 2020-02-04 | DRG: 308 | Disposition: A | Discharge status: Home / Self Care | Payer: Medicare Other | Attending: Internal Medicine | Admitting: Internal Medicine

## 2020-01-31 ENCOUNTER — Emergency Department (HOSPITAL_COMMUNITY): Payer: Medicare Other

## 2020-01-31 DIAGNOSIS — E559 Vitamin D deficiency, unspecified: Secondary | ICD-10-CM | POA: Diagnosis present

## 2020-01-31 DIAGNOSIS — Z823 Family history of stroke: Secondary | ICD-10-CM

## 2020-01-31 DIAGNOSIS — Z7901 Long term (current) use of anticoagulants: Secondary | ICD-10-CM

## 2020-01-31 DIAGNOSIS — K2971 Gastritis, unspecified, with bleeding: Secondary | ICD-10-CM | POA: Diagnosis not present

## 2020-01-31 DIAGNOSIS — I4729 Other ventricular tachycardia: Secondary | ICD-10-CM

## 2020-01-31 DIAGNOSIS — K5900 Constipation, unspecified: Secondary | ICD-10-CM | POA: Diagnosis present

## 2020-01-31 DIAGNOSIS — E1110 Type 2 diabetes mellitus with ketoacidosis without coma: Secondary | ICD-10-CM | POA: Diagnosis not present

## 2020-01-31 DIAGNOSIS — N189 Chronic kidney disease, unspecified: Secondary | ICD-10-CM

## 2020-01-31 DIAGNOSIS — D6859 Other primary thrombophilia: Secondary | ICD-10-CM | POA: Diagnosis present

## 2020-01-31 DIAGNOSIS — H9190 Unspecified hearing loss, unspecified ear: Secondary | ICD-10-CM

## 2020-01-31 DIAGNOSIS — G4700 Insomnia, unspecified: Secondary | ICD-10-CM | POA: Diagnosis present

## 2020-01-31 DIAGNOSIS — D500 Iron deficiency anemia secondary to blood loss (chronic): Secondary | ICD-10-CM | POA: Diagnosis present

## 2020-01-31 DIAGNOSIS — R52 Pain, unspecified: Secondary | ICD-10-CM | POA: Diagnosis not present

## 2020-01-31 DIAGNOSIS — E785 Hyperlipidemia, unspecified: Secondary | ICD-10-CM | POA: Diagnosis present

## 2020-01-31 DIAGNOSIS — N179 Acute kidney failure, unspecified: Secondary | ICD-10-CM

## 2020-01-31 DIAGNOSIS — I130 Hypertensive heart and chronic kidney disease with heart failure and stage 1 through stage 4 chronic kidney disease, or unspecified chronic kidney disease: Secondary | ICD-10-CM | POA: Diagnosis present

## 2020-01-31 DIAGNOSIS — I428 Other cardiomyopathies: Secondary | ICD-10-CM

## 2020-01-31 DIAGNOSIS — R58 Hemorrhage, not elsewhere classified: Secondary | ICD-10-CM | POA: Diagnosis not present

## 2020-01-31 DIAGNOSIS — Z8719 Personal history of other diseases of the digestive system: Secondary | ICD-10-CM

## 2020-01-31 DIAGNOSIS — I472 Ventricular tachycardia, unspecified: Secondary | ICD-10-CM

## 2020-01-31 DIAGNOSIS — Z950 Presence of cardiac pacemaker: Secondary | ICD-10-CM | POA: Diagnosis not present

## 2020-01-31 DIAGNOSIS — E669 Obesity, unspecified: Secondary | ICD-10-CM | POA: Diagnosis present

## 2020-01-31 DIAGNOSIS — Z95810 Presence of automatic (implantable) cardiac defibrillator: Secondary | ICD-10-CM

## 2020-01-31 DIAGNOSIS — F32A Depression, unspecified: Secondary | ICD-10-CM | POA: Diagnosis present

## 2020-01-31 DIAGNOSIS — I480 Paroxysmal atrial fibrillation: Principal | ICD-10-CM | POA: Diagnosis present

## 2020-01-31 DIAGNOSIS — E875 Hyperkalemia: Secondary | ICD-10-CM | POA: Diagnosis present

## 2020-01-31 DIAGNOSIS — I4891 Unspecified atrial fibrillation: Secondary | ICD-10-CM | POA: Diagnosis not present

## 2020-01-31 DIAGNOSIS — Z8249 Family history of ischemic heart disease and other diseases of the circulatory system: Secondary | ICD-10-CM

## 2020-01-31 DIAGNOSIS — R1110 Vomiting, unspecified: Secondary | ICD-10-CM | POA: Diagnosis not present

## 2020-01-31 DIAGNOSIS — Z6831 Body mass index (BMI) 31.0-31.9, adult: Secondary | ICD-10-CM

## 2020-01-31 DIAGNOSIS — K922 Gastrointestinal hemorrhage, unspecified: Secondary | ICD-10-CM | POA: Diagnosis present

## 2020-01-31 DIAGNOSIS — Z20822 Contact with and (suspected) exposure to covid-19: Secondary | ICD-10-CM | POA: Diagnosis not present

## 2020-01-31 DIAGNOSIS — I700 Atherosclerosis of aorta: Secondary | ICD-10-CM | POA: Diagnosis not present

## 2020-01-31 DIAGNOSIS — I878 Other specified disorders of veins: Secondary | ICD-10-CM | POA: Diagnosis not present

## 2020-01-31 DIAGNOSIS — D649 Anemia, unspecified: Secondary | ICD-10-CM | POA: Diagnosis present

## 2020-01-31 DIAGNOSIS — K2091 Esophagitis, unspecified with bleeding: Secondary | ICD-10-CM | POA: Diagnosis present

## 2020-01-31 DIAGNOSIS — H9193 Unspecified hearing loss, bilateral: Secondary | ICD-10-CM | POA: Diagnosis present

## 2020-01-31 DIAGNOSIS — E876 Hypokalemia: Secondary | ICD-10-CM | POA: Diagnosis present

## 2020-01-31 DIAGNOSIS — Z86711 Personal history of pulmonary embolism: Secondary | ICD-10-CM

## 2020-01-31 DIAGNOSIS — Z86718 Personal history of other venous thrombosis and embolism: Secondary | ICD-10-CM

## 2020-01-31 DIAGNOSIS — R17 Unspecified jaundice: Secondary | ICD-10-CM | POA: Diagnosis present

## 2020-01-31 DIAGNOSIS — E1165 Type 2 diabetes mellitus with hyperglycemia: Secondary | ICD-10-CM | POA: Diagnosis not present

## 2020-01-31 DIAGNOSIS — Z7984 Long term (current) use of oral hypoglycemic drugs: Secondary | ICD-10-CM

## 2020-01-31 DIAGNOSIS — I5042 Chronic combined systolic (congestive) and diastolic (congestive) heart failure: Secondary | ICD-10-CM

## 2020-01-31 DIAGNOSIS — E1122 Type 2 diabetes mellitus with diabetic chronic kidney disease: Secondary | ICD-10-CM | POA: Diagnosis present

## 2020-01-31 DIAGNOSIS — R109 Unspecified abdominal pain: Secondary | ICD-10-CM | POA: Diagnosis not present

## 2020-01-31 DIAGNOSIS — I248 Other forms of acute ischemic heart disease: Secondary | ICD-10-CM | POA: Diagnosis present

## 2020-01-31 DIAGNOSIS — I959 Hypotension, unspecified: Secondary | ICD-10-CM | POA: Diagnosis present

## 2020-01-31 DIAGNOSIS — R000 Tachycardia, unspecified: Secondary | ICD-10-CM | POA: Diagnosis not present

## 2020-01-31 DIAGNOSIS — N1832 Chronic kidney disease, stage 3b: Secondary | ICD-10-CM | POA: Diagnosis present

## 2020-01-31 DIAGNOSIS — R1084 Generalized abdominal pain: Secondary | ICD-10-CM | POA: Diagnosis not present

## 2020-01-31 DIAGNOSIS — Z79899 Other long term (current) drug therapy: Secondary | ICD-10-CM

## 2020-01-31 DIAGNOSIS — Z794 Long term (current) use of insulin: Secondary | ICD-10-CM

## 2020-01-31 DIAGNOSIS — E860 Dehydration: Secondary | ICD-10-CM | POA: Diagnosis present

## 2020-01-31 LAB — CBC WITH DIFFERENTIAL/PLATELET
Abs Immature Granulocytes: 0.02 10*3/uL (ref 0.00–0.07)
Basophils Absolute: 0 10*3/uL (ref 0.0–0.1)
Basophils Relative: 1 %
Eosinophils Absolute: 0 10*3/uL (ref 0.0–0.5)
Eosinophils Relative: 0 %
HCT: 41.8 % (ref 39.0–52.0)
Hemoglobin: 13.2 g/dL (ref 13.0–17.0)
Immature Granulocytes: 0 %
Lymphocytes Relative: 15 %
Lymphs Abs: 0.8 10*3/uL (ref 0.7–4.0)
MCH: 29.6 pg (ref 26.0–34.0)
MCHC: 31.6 g/dL (ref 30.0–36.0)
MCV: 93.7 fL (ref 80.0–100.0)
Monocytes Absolute: 0.3 10*3/uL (ref 0.1–1.0)
Monocytes Relative: 6 %
Neutro Abs: 3.8 10*3/uL (ref 1.7–7.7)
Neutrophils Relative %: 78 %
Platelets: 270 10*3/uL (ref 150–400)
RBC: 4.46 MIL/uL (ref 4.22–5.81)
RDW: 12.6 % (ref 11.5–15.5)
WBC: 4.9 10*3/uL (ref 4.0–10.5)
nRBC: 0 % (ref 0.0–0.2)

## 2020-01-31 LAB — COMPREHENSIVE METABOLIC PANEL
ALT: 24 U/L (ref 0–44)
AST: 39 U/L (ref 15–41)
Albumin: 3.5 g/dL (ref 3.5–5.0)
Alkaline Phosphatase: 59 U/L (ref 38–126)
Anion gap: 23 — ABNORMAL HIGH (ref 5–15)
BUN: 25 mg/dL — ABNORMAL HIGH (ref 6–20)
CO2: 21 mmol/L — ABNORMAL LOW (ref 22–32)
Calcium: 9 mg/dL (ref 8.9–10.3)
Chloride: 94 mmol/L — ABNORMAL LOW (ref 98–111)
Creatinine, Ser: 1.86 mg/dL — ABNORMAL HIGH (ref 0.61–1.24)
GFR, Estimated: 44 mL/min — ABNORMAL LOW (ref >60)
Glucose, Bld: 381 mg/dL — ABNORMAL HIGH (ref 70–99)
Potassium: 5.6 mmol/L — ABNORMAL HIGH (ref 3.5–5.1)
Sodium: 138 mmol/L (ref 135–145)
Total Bilirubin: 2.1 mg/dL — ABNORMAL HIGH (ref 0.3–1.2)
Total Protein: 7.5 g/dL (ref 6.5–8.1)

## 2020-01-31 LAB — TYPE AND SCREEN
ABO/RH(D): A POS
Antibody Screen: NEGATIVE

## 2020-01-31 LAB — LIPASE, BLOOD: Lipase: 73 U/L — ABNORMAL HIGH (ref 11–51)

## 2020-01-31 MED ORDER — PANTOPRAZOLE SODIUM 40 MG IV SOLR
40.0000 mg | Freq: Once | INTRAVENOUS | Status: AC
  Administered 2020-01-31: 40 mg via INTRAVENOUS
  Filled 2020-01-31: qty 40

## 2020-01-31 MED ORDER — SODIUM CHLORIDE 0.9 % IV SOLN: INTRAVENOUS | Status: DC

## 2020-01-31 MED ORDER — ONDANSETRON HCL 4 MG/2ML IJ SOLN
4.0000 mg | Freq: Once | INTRAMUSCULAR | Status: AC
  Administered 2020-01-31: 4 mg via INTRAVENOUS
  Filled 2020-01-31: qty 2

## 2020-01-31 NOTE — ED Provider Notes (Signed)
[facility] DEPT Provider Note   CSN: [id number] Arrival date & time: 01/31/20  2136     History Chief Complaint  Patient presents with  . Abdominal Pain    pt c/o abd pain and vomiting up blood x 1 day     Troy Rush is a 49 y.o. male.  Patient is a 49 year old male with a history of diabetes, hypertension, CHF, pancreatitis who presents with vomiting and abdominal pain.  He reports that he started having some vomiting today.  Its been ongoing all through the day.  The later episodes of vomiting have had small black specks which he was thinking might be blood.  He is also having abdominal pain that started after the vomiting.  Its across his mid and upper abdomen.  He has no diarrhea.  His last bowel movement was 2 days ago.  No fevers.  No urinary symptoms no past abdominal surgeries.  History was obtained through ASL video interpreter        Past Medical History:  Diagnosis Date  . AICD (automatic cardioverter/defibrillator) present 10/14/2016  . Bilateral deafness   . Bronchitis   . CHF (congestive heart failure) ([place])   . Depression   . Diabetes mellitus without complication ([place])   . DVT, lower extremity ([place]) 07/12/2019  . Dyslipidemia   . Headache 08/2019  . Hypertension   . Insomnia 08/2019  . NICM (nonischemic cardiomyopathy) ([place]) 09/15/2016  . Pancreatitis   . Vitamin D deficiency 08/2019    Patient Active Problem List   Diagnosis Date Noted  . Atrial fibrillation with RVR ([place]) 02/01/2020  . NSVT (nonsustained ventricular tachycardia) ([place]) 02/01/2020  . Acute GI bleeding 02/01/2020  . DKA (diabetic ketoacidosis) ([place]) 02/01/2020  . Bilateral deafness 08/15/2019  . Type 2 diabetes mellitus with complication, with long-term current use of insulin ([place]) 08/15/2019  . Hemoglobin A1C between 7% and 9% indicating borderline diabetic control 08/15/2019  . Insomnia 08/15/2019  . Abdominal pain 11/14/2018  . Ischemic  cardiomyopathy 10/14/2016  . NICM (nonischemic cardiomyopathy) ([place]) 09/15/2016  . Chronic systolic CHF (congestive heart failure) ([place]) 05/30/2016  . HTN (hypertension) 05/30/2016  . Deaf 05/30/2016  . Hyperkalemia 05/30/2016  . Diabetes ([place]) 03/21/2016  . Major depressive disorder, recurrent episode ([place]) 03/21/2016    Past Surgical History:  Procedure Laterality Date  . BACK SURGERY    . CARDIAC CATHETERIZATION    . ICD IMPLANT  10/14/2016  . ICD IMPLANT N/A 10/14/2016   Procedure: ICD Implant;  Surgeon: Deboraha Sprang, MD;  Location: [facility];  Service: Cardiovascular;  Laterality: N/A;  . WRIST SURGERY         Family History  Problem Relation Age of Onset  . Hypertension Mother   . Healthy Father   . Stroke Father     Social History   Tobacco Use  . Smoking status: Never Smoker  . Smokeless tobacco: Never Used  Vaping Use  . Vaping Use: Never used  Substance Use Topics  . Alcohol use: Yes    Alcohol/week: 1.0 - 2.0 standard drink    Types: 1 - 2 Glasses of wine per week    Comment: rare  . Drug use: No    Home Medications Prior to Admission medications   Medication Sig Start Date End Date Taking? Authorizing Provider  apixaban (ELIQUIS) 5 MG TABS tablet Take 1 tablet (5 mg total) by mouth 2 (two) times daily. 09/06/19  Yes Larey Dresser, MD  atorvastatin (LIPITOR) 80 MG tablet TAKE 1 TABLET(80 MG) BY MOUTH DAILY 10/01/19  Yes Dorena Dew, FNP  carvedilol (COREG) 12.5 MG tablet Take 1.5 tablets (18.75 mg total) by mouth 2 (two) times daily. 08/17/18  Yes Larey Dresser, MD  citalopram (CELEXA) 40 MG tablet Take 40 mg by mouth daily.   Yes [provider]  digoxin (LANOXIN) 0.125 MG tablet Take 1 tablet (0.125 mg total) by mouth daily. 08/27/18  Yes Georgiana Shore, NP  fenofibrate 54 MG tablet Take 1 tablet (54 mg total) by mouth daily. 12/11/19 03/10/20 Yes Larey Dresser, MD  furosemide (LASIX) 40 MG tablet Take 80 mg (2 tablet)  every morning and 40 mg (1 tablet) every evening 10/07/19  Yes Larey Dresser, MD  icosapent Ethyl (VASCEPA) 1 g capsule Take 2 capsules (2 g total) by mouth 2 (two) times daily. 09/11/19  Yes Larey Dresser, MD  Insulin Glargine (LANTUS SOLOSTAR) 100 UNIT/ML Solostar Pen Inject 40 Units into the skin daily at 10 pm. Patient taking differently: Inject 35 Units into the skin daily at 10 pm.  07/13/18  Yes Lanae Boast, FNP  INVOKANA 300 MG TABS tablet TAKE 1 TABLET(300 MG) BY MOUTH DAILY BEFORE BREAKFAST 10/02/19  Yes Vevelyn Francois, NP  isosorbide mononitrate (IMDUR) 30 MG 24 hr tablet Take 1 tablet (30 mg total) by mouth daily. 09/06/19 02/01/20 Yes Larey Dresser, MD  metFORMIN (GLUCOPHAGE) 1000 MG tablet TAKE 1 TABLET(1000 MG) BY MOUTH TWICE DAILY WITH A MEAL 10/01/19  Yes Dorena Dew, FNP  sacubitril-valsartan (ENTRESTO) 24-26 MG Take 1 tablet by mouth 2 (two) times daily. 09/06/19  Yes Larey Dresser, MD  topiramate (TOPAMAX) 50 MG tablet TAKE 1 TABLET BY MOUTH TWICE DAILY AS NEEDED FOR HEADACHE 10/31/19  Yes [name], FNP  traZODone (DESYREL) 100 MG tablet Take 1 tablet (100 mg total) by mouth at bedtime. 08/13/19  Yes [name], FNP  Vitamin D, Ergocalciferol, (DRISDOL) 1.25 MG (50000 UNIT) CAPS capsule Take 1 capsule (50,000 Units total) by mouth every 7 (seven) days. 08/19/19  Yes [name], FNP    Allergies    Lisinopril  Review of Systems   Review of Systems  Constitutional: Negative for chills, diaphoresis, fatigue and fever.  HENT: Negative for congestion, rhinorrhea and sneezing.   Eyes: Negative.   Respiratory: Negative for cough, chest tightness and shortness of breath.   Cardiovascular: Negative for chest pain and leg swelling.  Gastrointestinal: Positive for abdominal pain, nausea and vomiting. Negative for blood in stool and diarrhea.  Genitourinary: Negative for difficulty urinating, flank pain, frequency and hematuria.  Musculoskeletal:  Negative for arthralgias and back pain.  Skin: Negative for rash.  Neurological: Negative for dizziness, speech difficulty, weakness, numbness and headaches.    Physical Exam Updated Vital Signs BP 102/71 (BP Location: Left Arm)   Pulse 70   Temp 98.9 F (37.2 C) (Oral)   Resp 18   Ht 5\' 10"  (1.778 m)   Wt 99.8 kg   SpO2 98%   BMI 31.57 kg/m   Physical Exam Constitutional:      Appearance: He is well-developed.  HENT:     Head: Normocephalic and atraumatic.  Eyes:     Pupils: Pupils are equal, round, and reactive to light.  Cardiovascular:     Rate and Rhythm: Normal rate and regular rhythm.     Heart sounds: Normal heart sounds.  Pulmonary:     Effort: Pulmonary  effort is normal. No respiratory distress.     Breath sounds: Normal breath sounds. No wheezing or rales.  Chest:     Chest wall: No tenderness.  Abdominal:     General: Bowel sounds are normal.     Palpations: Abdomen is soft.     Tenderness: There is generalized abdominal tenderness. There is no guarding or rebound.  Musculoskeletal:        General: Normal range of motion.     Cervical back: Normal range of motion and neck supple.  Lymphadenopathy:     Cervical: No cervical adenopathy.  Skin:    General: Skin is warm and dry.     Findings: No rash.  Neurological:     Mental Status: He is alert and oriented to person, place, and time.     ED Results / Procedures / Treatments   Labs (all labs ordered are listed, but only abnormal results are displayed) Labs Reviewed  COMPREHENSIVE METABOLIC PANEL - Abnormal; Notable for the following components:      Result Value   Potassium 5.6 (*)    Chloride 94 (*)    CO2 21 (*)    Glucose, Bld 381 (*)    BUN 25 (*)    Creatinine, Ser 1.86 (*)    Total Bilirubin 2.1 (*)    GFR, Estimated 44 (*)    Anion gap 23 (*)    All other components within normal limits  LIPASE, BLOOD - Abnormal; Notable for the following components:   Lipase 73 (*)    All other  components within normal limits  DIGOXIN LEVEL - Abnormal; Notable for the following components:   Digoxin Level <0.2 (*)    All other components within normal limits  URINALYSIS, ROUTINE W REFLEX MICROSCOPIC - Abnormal; Notable for the following components:   Glucose, UA >=500 (*)    Hgb urine dipstick MODERATE (*)    Ketones, ur 20 (*)    Protein, ur >=300 (*)    All other components within normal limits  BLOOD GAS, VENOUS - Abnormal; Notable for the following components:   pCO2, Ven 38.4 (*)    pO2, Ven 51.2 (*)    Acid-base deficit 2.7 (*)    All other components within normal limits  CBG MONITORING, ED - Abnormal; Notable for the following components:   Glucose-Capillary 385 (*)    All other components within normal limits  CBG MONITORING, ED - Abnormal; Notable for the following components:   Glucose-Capillary 304 (*)    All other components within normal limits  CBG MONITORING, ED - Abnormal; Notable for the following components:   Glucose-Capillary 190 (*)    All other components within normal limits  CBG MONITORING, ED - Abnormal; Notable for the following components:   Glucose-Capillary 201 (*)    All other components within normal limits  RESPIRATORY PANEL BY RT PCR (FLU A&B, COVID)  CBC WITH DIFFERENTIAL/PLATELET  MAGNESIUM  HIV ANTIBODY (ROUTINE TESTING W REFLEX)  BASIC METABOLIC PANEL  BASIC METABOLIC PANEL  BASIC METABOLIC PANEL  BASIC METABOLIC PANEL  BASIC METABOLIC PANEL  BETA-HYDROXYBUTYRIC ACID  BETA-HYDROXYBUTYRIC ACID  BETA-HYDROXYBUTYRIC ACID  HEMOGLOBIN A1C  TSH  LACTIC ACID, PLASMA  LACTIC ACID, PLASMA  CBC  CBC  CBC  CBC  TYPE AND SCREEN  ABO/RH  TROPONIN I (HIGH SENSITIVITY)    EKG EKG Interpretation  Date/Time:  Saturday February 01 2020 00:01:18 EDT Ventricular Rate:  91 PR Interval:    QRS Duration: 85 QT Interval:  417 QTC Calculation: 514 R Axis:   22 Text Interpretation: Sinus rhythm Prolonged PR interval Probable left  atrial enlargement Probable anterior infarct, old Prolonged QT interval Confirmed by Malvin Johns [phone]) on 02/01/2020 1:15:30 AM   Radiology CT Abdomen Pelvis Wo Contrast  Result Date: 02/01/2020 CLINICAL DATA:  Abdominal pain and vomiting EXAM: CT ABDOMEN AND PELVIS WITHOUT CONTRAST TECHNIQUE: Multidetector CT imaging of the abdomen and pelvis was performed following the standard protocol without IV contrast. COMPARISON:  Aug 19, 2019 FINDINGS: Lower chest: There is mild cardiomegaly present. No hiatal hernia. The visualized portions of the lungs are clear. Hepatobiliary: Although limited due to the lack of intravenous contrast, normal in appearance without gross focal abnormality. No evidence of calcified gallstones or biliary ductal dilatation. Pancreas:  Unremarkable.  No surrounding inflammatory changes. Spleen: Normal in size. Although limited due to the lack of intravenous contrast, normal in appearance. Adrenals/Urinary Tract: Both adrenal glands appear normal. The kidneys and collecting system appear normal without evidence of urinary tract calculus or hydronephrosis. Bladder is unremarkable. Stomach/Bowel: The stomach, small bowel, and colon are normal in appearance. No inflammatory changes or obstructive findings. A moderate amount of colonic stool is present. Appendix is normal. Vascular/Lymphatic: There are no enlarged abdominal or pelvic lymph nodes. Scattered mild aortic atherosclerotic calcifications are seen without aneurysmal dilatation. Reproductive: The prostate is unremarkable. Other: No evidence of abdominal wall mass or hernia. Musculoskeletal: No acute or significant osseous findings. IMPRESSION: No acute intra-abdominal or pelvic pathology to explain the patient's symptoms. Moderate amount of colonic stool Aortic Atherosclerosis (ICD10-I70.0). Electronically Signed   By: Prudencio Pair M.D.   On: 02/01/2020 02:37   DG ABD ACUTE 2+V W 1V CHEST  Result Date: 01/31/2020 CLINICAL  DATA:  Abdominal pain and vomiting starting today. EXAM: DG ABDOMEN ACUTE WITH 1 VIEW CHEST COMPARISON:  Chest 08/18/2019.  CT abdomen and pelvis 08/19/2019 FINDINGS: Cardiac pacemaker. Shallow inspiration. Heart size and pulmonary vascularity are normal for technique. Lungs are clear. No pleural effusions. No pneumothorax. Scattered gas and stool throughout the colon. No small or large bowel distention. No free intra-abdominal air. No abnormal air-fluid levels. No radiopaque stones. Calcified phleboliths in the pelvis. Visualized bones and soft tissue contours are normal. IMPRESSION: No evidence of active pulmonary disease. Nonobstructive bowel gas pattern. Electronically Signed   By: Lucienne Capers M.D.   On: 01/31/2020 23:52    Procedures Procedures (including critical care time)  Medications Ordered in ED Medications  0.9 %  sodium chloride infusion ( Intravenous Stopped 01/31/20 2333)  amiodarone (NEXTERONE) 1.8 mg/mL load via infusion 150 mg (150 mg Intravenous Bolus from Bag 02/01/20 0312)    Followed by  amiodarone (NEXTERONE PREMIX) 360-4.14 MG/200ML-% (1.8 mg/mL) IV infusion (60 mg/hr Intravenous New Bag/Given 02/01/20 0328)    Followed by  amiodarone (NEXTERONE PREMIX) 360-4.14 MG/200ML-% (1.8 mg/mL) IV infusion (has no administration in time range)  insulin regular, human (MYXREDLIN) 100 units/ 100 mL infusion (has no administration in time range)  lactated ringers infusion (has no administration in time range)  dextrose 5 % in lactated ringers infusion (has no administration in time range)  dextrose 50 % solution 0-50 mL (has no administration in time range)  famotidine (PEPCID) IVPB 20 mg premix (has no administration in time range)  ondansetron (ZOFRAN) injection 4 mg (4 mg Intravenous Given 01/31/20 2354)  pantoprazole (PROTONIX) injection 40 mg (40 mg Intravenous Given 01/31/20 2356)  fentaNYL (SUBLIMAZE) injection 50 mcg (50 mcg Intravenous Given 02/01/20 0031)  lactated  ringers bolus 500 mL (0 mLs Intravenous Stopped 02/01/20 0300)  morphine 4 MG/ML injection 4 mg (4 mg Intravenous Given 02/01/20 0201)    ED Course  I have reviewed the triage vital signs and the nursing notes.  Pertinent labs & imaging results that were available during my care of the patient were reviewed by me and considered in my medical decision making (see chart for details).    MDM Rules/Calculators/A&P                          Patient is a 49 year old male who presents with abdominal pain associated with nausea and vomiting.  His pain is mostly periumbilical on and upper abdomen.  He has suggestions of DKA on his lab work with the anion gap, elevated blood sugar and slightly low CO2.  He has normal pH.  His creatinine is elevated and his potassium is mildly elevated.  He was given IV fluids.  He does not have fever or other suggestions of infection.  Given his abdominal pain, I did acute abdominal series to assess for obstruction.  There is no evidence of obstruction or other abnormality.  However his pain seemed to get worse while he was in the ED.  Performed a CT of the abdomen pelvis.  This showed no acute abnormality.  While he was being evaluated, he went into atrial fibrillation with RVR.  Vagal maneuvers were attempted but were unsuccessful.  He then started having some short runs of V. tach with 8-10 beats.  He had several episodes of this while he was being monitored.  He was started on amiodarone.  His blood pressure stayed stable.  He did not have indication for emergent cardioversion at this time.  I spoke with the cardiology fellow who recommends continuing amiodarone drip.  He did not recommend any further intervention unless the heart rate goes back up into the 170s.  Initially started in the 170s and following amiodarone improved to the 140s to 150s.  I spoke with Dr. Hal Hope with the hospitalist service to admit the patient for further treatment.  CRITICAL CARE Performed  by: Malvin Johns Total critical care time: 70 minutes Critical care time was exclusive of separately billable procedures and treating other patients. Critical care was necessary to treat or prevent imminent or life-threatening deterioration. Critical care was time spent personally by me on the following activities: development of treatment plan with patient and/or surrogate as well as nursing, discussions with consultants, evaluation of patient's response to treatment, examination of patient, obtaining history from patient or surrogate, ordering and performing treatments and interventions, ordering and review of laboratory studies, ordering and review of radiographic studies, pulse oximetry and re-evaluation of patient's condition.  Final Clinical Impression(s) / ED Diagnoses Final diagnoses:  Diabetic ketoacidosis without coma associated with type 2 diabetes mellitus ([place])  Atrial fibrillation with rapid ventricular response ([place])  Ventricular tachycardia [facility])    Rx / DC Orders ED Discharge Orders    None       Malvin Johns, MD 02/01/20 [phone]

## 2020-01-31 NOTE — ED Notes (Signed)
Pt c/o abd pain and vomiting that started today. Pt states pain all over. Pt is activily vomiting. Vomit is a yellowish colo

## 2020-02-01 ENCOUNTER — Emergency Department (HOSPITAL_COMMUNITY): Payer: Medicare Other

## 2020-02-01 ENCOUNTER — Inpatient Hospital Stay (HOSPITAL_COMMUNITY): Payer: Medicare Other

## 2020-02-01 DIAGNOSIS — K5900 Constipation, unspecified: Secondary | ICD-10-CM | POA: Diagnosis present

## 2020-02-01 DIAGNOSIS — K921 Melena: Secondary | ICD-10-CM | POA: Diagnosis not present

## 2020-02-01 DIAGNOSIS — K920 Hematemesis: Secondary | ICD-10-CM | POA: Diagnosis not present

## 2020-02-01 DIAGNOSIS — I4891 Unspecified atrial fibrillation: Secondary | ICD-10-CM

## 2020-02-01 DIAGNOSIS — R1013 Epigastric pain: Secondary | ICD-10-CM | POA: Diagnosis not present

## 2020-02-01 DIAGNOSIS — I5042 Chronic combined systolic (congestive) and diastolic (congestive) heart failure: Secondary | ICD-10-CM | POA: Diagnosis not present

## 2020-02-01 DIAGNOSIS — E876 Hypokalemia: Secondary | ICD-10-CM | POA: Diagnosis present

## 2020-02-01 DIAGNOSIS — I428 Other cardiomyopathies: Secondary | ICD-10-CM | POA: Diagnosis present

## 2020-02-01 DIAGNOSIS — D649 Anemia, unspecified: Secondary | ICD-10-CM | POA: Diagnosis present

## 2020-02-01 DIAGNOSIS — K209 Esophagitis, unspecified without bleeding: Secondary | ICD-10-CM | POA: Diagnosis not present

## 2020-02-01 DIAGNOSIS — R1110 Vomiting, unspecified: Secondary | ICD-10-CM | POA: Diagnosis not present

## 2020-02-01 DIAGNOSIS — R109 Unspecified abdominal pain: Secondary | ICD-10-CM | POA: Diagnosis not present

## 2020-02-01 DIAGNOSIS — K922 Gastrointestinal hemorrhage, unspecified: Secondary | ICD-10-CM

## 2020-02-01 DIAGNOSIS — R17 Unspecified jaundice: Secondary | ICD-10-CM | POA: Diagnosis not present

## 2020-02-01 DIAGNOSIS — E1310 Other specified diabetes mellitus with ketoacidosis without coma: Secondary | ICD-10-CM

## 2020-02-01 DIAGNOSIS — E875 Hyperkalemia: Secondary | ICD-10-CM | POA: Diagnosis present

## 2020-02-01 DIAGNOSIS — E1110 Type 2 diabetes mellitus with ketoacidosis without coma: Secondary | ICD-10-CM | POA: Diagnosis present

## 2020-02-01 DIAGNOSIS — K2970 Gastritis, unspecified, without bleeding: Secondary | ICD-10-CM | POA: Diagnosis not present

## 2020-02-01 DIAGNOSIS — N1831 Chronic kidney disease, stage 3a: Secondary | ICD-10-CM | POA: Diagnosis not present

## 2020-02-01 DIAGNOSIS — E1122 Type 2 diabetes mellitus with diabetic chronic kidney disease: Secondary | ICD-10-CM | POA: Diagnosis present

## 2020-02-01 DIAGNOSIS — I4729 Other ventricular tachycardia: Secondary | ICD-10-CM

## 2020-02-01 DIAGNOSIS — E669 Obesity, unspecified: Secondary | ICD-10-CM | POA: Diagnosis present

## 2020-02-01 DIAGNOSIS — I472 Ventricular tachycardia: Secondary | ICD-10-CM

## 2020-02-01 DIAGNOSIS — K2971 Gastritis, unspecified, with bleeding: Secondary | ICD-10-CM | POA: Diagnosis not present

## 2020-02-01 DIAGNOSIS — G4700 Insomnia, unspecified: Secondary | ICD-10-CM | POA: Diagnosis present

## 2020-02-01 DIAGNOSIS — I959 Hypotension, unspecified: Secondary | ICD-10-CM | POA: Diagnosis present

## 2020-02-01 DIAGNOSIS — H9193 Unspecified hearing loss, bilateral: Secondary | ICD-10-CM | POA: Diagnosis present

## 2020-02-01 DIAGNOSIS — N179 Acute kidney failure, unspecified: Secondary | ICD-10-CM | POA: Diagnosis not present

## 2020-02-01 DIAGNOSIS — K2091 Esophagitis, unspecified with bleeding: Secondary | ICD-10-CM | POA: Diagnosis not present

## 2020-02-01 DIAGNOSIS — I248 Other forms of acute ischemic heart disease: Secondary | ICD-10-CM | POA: Diagnosis not present

## 2020-02-01 DIAGNOSIS — N189 Chronic kidney disease, unspecified: Secondary | ICD-10-CM | POA: Diagnosis not present

## 2020-02-01 DIAGNOSIS — I480 Paroxysmal atrial fibrillation: Secondary | ICD-10-CM | POA: Diagnosis not present

## 2020-02-01 DIAGNOSIS — D6859 Other primary thrombophilia: Secondary | ICD-10-CM | POA: Diagnosis not present

## 2020-02-01 DIAGNOSIS — I700 Atherosclerosis of aorta: Secondary | ICD-10-CM | POA: Diagnosis not present

## 2020-02-01 DIAGNOSIS — Z20822 Contact with and (suspected) exposure to covid-19: Secondary | ICD-10-CM | POA: Diagnosis not present

## 2020-02-01 DIAGNOSIS — N1832 Chronic kidney disease, stage 3b: Secondary | ICD-10-CM | POA: Diagnosis present

## 2020-02-01 DIAGNOSIS — E860 Dehydration: Secondary | ICD-10-CM | POA: Diagnosis present

## 2020-02-01 DIAGNOSIS — I130 Hypertensive heart and chronic kidney disease with heart failure and stage 1 through stage 4 chronic kidney disease, or unspecified chronic kidney disease: Secondary | ICD-10-CM | POA: Diagnosis not present

## 2020-02-01 DIAGNOSIS — D62 Acute posthemorrhagic anemia: Secondary | ICD-10-CM | POA: Diagnosis not present

## 2020-02-01 LAB — CBC
HCT: 38.9 % — ABNORMAL LOW (ref 39.0–52.0)
HCT: 38 % — ABNORMAL LOW (ref 39.0–52.0)
HCT: 38.1 % — ABNORMAL LOW (ref 39.0–52.0)
HCT: 38.7 % — ABNORMAL LOW (ref 39.0–52.0)
Hemoglobin: 12.4 g/dL — ABNORMAL LOW (ref 13.0–17.0)
Hemoglobin: 11.9 g/dL — ABNORMAL LOW (ref 13.0–17.0)
Hemoglobin: 11.9 g/dL — ABNORMAL LOW (ref 13.0–17.0)
Hemoglobin: 12 g/dL — ABNORMAL LOW (ref 13.0–17.0)
MCH: 29.7 pg (ref 26.0–34.0)
MCH: 29.6 pg (ref 26.0–34.0)
MCH: 29.7 pg (ref 26.0–34.0)
MCH: 29.6 pg (ref 26.0–34.0)
MCHC: 31.9 g/dL (ref 30.0–36.0)
MCHC: 31.3 g/dL (ref 30.0–36.0)
MCHC: 31.2 g/dL (ref 30.0–36.0)
MCHC: 31 g/dL (ref 30.0–36.0)
MCV: 93.3 fL (ref 80.0–100.0)
MCV: 94.5 fL (ref 80.0–100.0)
MCV: 95 fL (ref 80.0–100.0)
MCV: 95.3 fL (ref 80.0–100.0)
Platelets: 283 10*3/uL (ref 150–400)
Platelets: 239 10*3/uL (ref 150–400)
Platelets: 256 10*3/uL (ref 150–400)
Platelets: 215 10*3/uL (ref 150–400)
RBC: 4.17 MIL/uL — ABNORMAL LOW (ref 4.22–5.81)
RBC: 4.02 MIL/uL — ABNORMAL LOW (ref 4.22–5.81)
RBC: 4.01 MIL/uL — ABNORMAL LOW (ref 4.22–5.81)
RBC: 4.06 MIL/uL — ABNORMAL LOW (ref 4.22–5.81)
RDW: 12.9 % (ref 11.5–15.5)
RDW: 12.9 % (ref 11.5–15.5)
RDW: 13.1 % (ref 11.5–15.5)
RDW: 12.8 % (ref 11.5–15.5)
WBC: 7 10*3/uL (ref 4.0–10.5)
WBC: 6.3 10*3/uL (ref 4.0–10.5)
WBC: 9 10*3/uL (ref 4.0–10.5)
WBC: 8.7 10*3/uL (ref 4.0–10.5)
nRBC: 0.3 % — ABNORMAL HIGH (ref 0.0–0.2)
nRBC: 0 % (ref 0.0–0.2)
nRBC: 0 % (ref 0.0–0.2)
nRBC: 0 % (ref 0.0–0.2)

## 2020-02-01 LAB — BASIC METABOLIC PANEL
Anion gap: 21 — ABNORMAL HIGH (ref 5–15)
Anion gap: 17 — ABNORMAL HIGH (ref 5–15)
Anion gap: 14 (ref 5–15)
Anion gap: 12 (ref 5–15)
Anion gap: 15 (ref 5–15)
Anion gap: 13 (ref 5–15)
BUN: 31 mg/dL — ABNORMAL HIGH (ref 6–20)
BUN: 31 mg/dL — ABNORMAL HIGH (ref 6–20)
BUN: 33 mg/dL — ABNORMAL HIGH (ref 6–20)
BUN: 37 mg/dL — ABNORMAL HIGH (ref 6–20)
BUN: 34 mg/dL — ABNORMAL HIGH (ref 6–20)
BUN: 36 mg/dL — ABNORMAL HIGH (ref 6–20)
CO2: 22 mmol/L (ref 22–32)
CO2: 23 mmol/L (ref 22–32)
CO2: 24 mmol/L (ref 22–32)
CO2: 28 mmol/L (ref 22–32)
CO2: 25 mmol/L (ref 22–32)
CO2: 26 mmol/L (ref 22–32)
Calcium: 9.2 mg/dL (ref 8.9–10.3)
Calcium: 8.9 mg/dL (ref 8.9–10.3)
Calcium: 8.6 mg/dL — ABNORMAL LOW (ref 8.9–10.3)
Calcium: 9 mg/dL (ref 8.9–10.3)
Calcium: 9 mg/dL (ref 8.9–10.3)
Calcium: 8.9 mg/dL (ref 8.9–10.3)
Chloride: 98 mmol/L (ref 98–111)
Chloride: 99 mmol/L (ref 98–111)
Chloride: 98 mmol/L (ref 98–111)
Chloride: 98 mmol/L (ref 98–111)
Chloride: 97 mmol/L — ABNORMAL LOW (ref 98–111)
Chloride: 96 mmol/L — ABNORMAL LOW (ref 98–111)
Creatinine, Ser: 2.31 mg/dL — ABNORMAL HIGH (ref 0.61–1.24)
Creatinine, Ser: 2.59 mg/dL — ABNORMAL HIGH (ref 0.61–1.24)
Creatinine, Ser: 3.15 mg/dL — ABNORMAL HIGH (ref 0.61–1.24)
Creatinine, Ser: 2.97 mg/dL — ABNORMAL HIGH (ref 0.61–1.24)
Creatinine, Ser: 2.84 mg/dL — ABNORMAL HIGH (ref 0.61–1.24)
Creatinine, Ser: 3.01 mg/dL — ABNORMAL HIGH (ref 0.61–1.24)
GFR, Estimated: 34 mL/min — ABNORMAL LOW (ref >60)
GFR, Estimated: 29 mL/min — ABNORMAL LOW (ref >60)
GFR, Estimated: 23 mL/min — ABNORMAL LOW (ref >60)
GFR, Estimated: 25 mL/min — ABNORMAL LOW (ref >60)
GFR, Estimated: 26 mL/min — ABNORMAL LOW (ref >60)
GFR, Estimated: 24 mL/min — ABNORMAL LOW (ref >60)
Glucose, Bld: 240 mg/dL — ABNORMAL HIGH (ref 70–99)
Glucose, Bld: 222 mg/dL — ABNORMAL HIGH (ref 70–99)
Glucose, Bld: 200 mg/dL — ABNORMAL HIGH (ref 70–99)
Glucose, Bld: 185 mg/dL — ABNORMAL HIGH (ref 70–99)
Glucose, Bld: 167 mg/dL — ABNORMAL HIGH (ref 70–99)
Glucose, Bld: 238 mg/dL — ABNORMAL HIGH (ref 70–99)
Potassium: 4 mmol/L (ref 3.5–5.1)
Potassium: 3.8 mmol/L (ref 3.5–5.1)
Potassium: 6.1 mmol/L — ABNORMAL HIGH (ref 3.5–5.1)
Potassium: 3.7 mmol/L (ref 3.5–5.1)
Potassium: 3.9 mmol/L (ref 3.5–5.1)
Potassium: 3.5 mmol/L (ref 3.5–5.1)
Sodium: 141 mmol/L (ref 135–145)
Sodium: 139 mmol/L (ref 135–145)
Sodium: 136 mmol/L (ref 135–145)
Sodium: 138 mmol/L (ref 135–145)
Sodium: 137 mmol/L (ref 135–145)
Sodium: 135 mmol/L (ref 135–145)

## 2020-02-01 LAB — CBG MONITORING, ED
Glucose-Capillary: 115 mg/dL — ABNORMAL HIGH (ref 70–99)
Glucose-Capillary: 132 mg/dL — ABNORMAL HIGH (ref 70–99)
Glucose-Capillary: 137 mg/dL — ABNORMAL HIGH (ref 70–99)
Glucose-Capillary: 138 mg/dL — ABNORMAL HIGH (ref 70–99)
Glucose-Capillary: 154 mg/dL — ABNORMAL HIGH (ref 70–99)
Glucose-Capillary: 161 mg/dL — ABNORMAL HIGH (ref 70–99)
Glucose-Capillary: 187 mg/dL — ABNORMAL HIGH (ref 70–99)
Glucose-Capillary: 190 mg/dL — ABNORMAL HIGH (ref 70–99)
Glucose-Capillary: 199 mg/dL — ABNORMAL HIGH (ref 70–99)
Glucose-Capillary: 201 mg/dL — ABNORMAL HIGH (ref 70–99)
Glucose-Capillary: 217 mg/dL — ABNORMAL HIGH (ref 70–99)
Glucose-Capillary: 222 mg/dL — ABNORMAL HIGH (ref 70–99)
Glucose-Capillary: 261 mg/dL — ABNORMAL HIGH (ref 70–99)
Glucose-Capillary: 280 mg/dL — ABNORMAL HIGH (ref 70–99)
Glucose-Capillary: 304 mg/dL — ABNORMAL HIGH (ref 70–99)
Glucose-Capillary: 385 mg/dL — ABNORMAL HIGH (ref 70–99)

## 2020-02-01 LAB — URINALYSIS, ROUTINE W REFLEX MICROSCOPIC
Bacteria, UA: NONE SEEN
Bilirubin Urine: NEGATIVE
Glucose, UA: >=500 mg/dL — AB
Ketones, ur: 20 mg/dL — AB
Leukocytes,Ua: NEGATIVE
Nitrite: NEGATIVE
Protein, ur: >=300 mg/dL — AB
Specific Gravity, Urine: 1.03 (ref 1.005–1.030)
pH: 5 (ref 5.0–8.0)

## 2020-02-01 LAB — RESPIRATORY PANEL BY RT PCR (FLU A&B, COVID)
Influenza A by PCR: NEGATIVE
Influenza B by PCR: NEGATIVE
SARS Coronavirus 2 by RT PCR: NEGATIVE

## 2020-02-01 LAB — BLOOD GAS, VENOUS
Acid-base deficit: 2.7 mmol/L — ABNORMAL HIGH (ref 0.0–2.0)
Bicarbonate: 21.7 mmol/L (ref 20.0–28.0)
O2 Saturation: 80.4 %
Patient temperature: 98.6
pCO2, Ven: 38.4 mmHg — ABNORMAL LOW (ref 44.0–60.0)
pH, Ven: 7.37 (ref 7.250–7.430)
pO2, Ven: 51.2 mmHg — ABNORMAL HIGH (ref 32.0–45.0)

## 2020-02-01 LAB — MAGNESIUM: Magnesium: 1.8 mg/dL (ref 1.7–2.4)

## 2020-02-01 LAB — BETA-HYDROXYBUTYRIC ACID
Beta-Hydroxybutyric Acid: 4.82 mmol/L — ABNORMAL HIGH (ref 0.05–0.27)
Beta-Hydroxybutyric Acid: 1.87 mmol/L — ABNORMAL HIGH (ref 0.05–0.27)
Beta-Hydroxybutyric Acid: 0.73 mmol/L — ABNORMAL HIGH (ref 0.05–0.27)

## 2020-02-01 LAB — ABO/RH: ABO/RH(D): A POS

## 2020-02-01 LAB — TROPONIN I (HIGH SENSITIVITY)
Troponin I (High Sensitivity): 79 ng/L — ABNORMAL HIGH (ref <18)
Troponin I (High Sensitivity): 393 ng/L (ref <18)

## 2020-02-01 LAB — MRSA PCR SCREENING: MRSA by PCR: NEGATIVE

## 2020-02-01 LAB — LACTIC ACID, PLASMA: Lactic Acid, Venous: 2.4 mmol/L (ref 0.5–1.9)

## 2020-02-01 LAB — GLUCOSE, CAPILLARY
Glucose-Capillary: 226 mg/dL — ABNORMAL HIGH (ref 70–99)
Glucose-Capillary: 190 mg/dL — ABNORMAL HIGH (ref 70–99)
Glucose-Capillary: 159 mg/dL — ABNORMAL HIGH (ref 70–99)

## 2020-02-01 LAB — TSH: TSH: 3.073 u[IU]/mL (ref 0.350–4.500)

## 2020-02-01 LAB — HIV ANTIBODY (ROUTINE TESTING W REFLEX): HIV Screen 4th Generation wRfx: NONREACTIVE

## 2020-02-01 LAB — DIGOXIN LEVEL: Digoxin Level: <0.2 ng/mL — ABNORMAL LOW (ref 1.0–2.0)

## 2020-02-01 MED ORDER — PROCHLORPERAZINE EDISYLATE 10 MG/2ML IJ SOLN
10.0000 mg | Freq: Four times a day (QID) | INTRAMUSCULAR | Status: DC | PRN
  Administered 2020-02-01 – 2020-02-02 (×3): 10 mg via INTRAVENOUS
  Filled 2020-02-01 (×3): qty 2

## 2020-02-01 MED ORDER — LACTATED RINGERS IV SOLN: INTRAVENOUS | Status: DC

## 2020-02-01 MED ORDER — HYDRALAZINE HCL 20 MG/ML IJ SOLN
10.0000 mg | Freq: Four times a day (QID) | INTRAMUSCULAR | Status: DC | PRN
  Administered 2020-02-01 – 2020-02-03 (×5): 10 mg via INTRAVENOUS
  Filled 2020-02-01 (×5): qty 1

## 2020-02-01 MED ORDER — ORAL CARE MOUTH RINSE
15.0000 mL | Freq: Two times a day (BID) | OROMUCOSAL | Status: DC
  Administered 2020-02-01 – 2020-02-04 (×6): 15 mL via OROMUCOSAL

## 2020-02-01 MED ORDER — AMIODARONE HCL IN DEXTROSE 360-4.14 MG/200ML-% IV SOLN
30.0000 mg/h | INTRAVENOUS | Status: DC
  Administered 2020-02-01 – 2020-02-03 (×5): 30 mg/h via INTRAVENOUS
  Filled 2020-02-01 (×5): qty 200

## 2020-02-01 MED ORDER — AMIODARONE LOAD VIA INFUSION
150.0000 mg | Freq: Once | INTRAVENOUS | Status: AC
  Administered 2020-02-01 (×2): 150 mg via INTRAVENOUS
  Filled 2020-02-01: qty 83.34

## 2020-02-01 MED ORDER — FAMOTIDINE IN NACL 20-0.9 MG/50ML-% IV SOLN
20.0000 mg | Freq: Two times a day (BID) | INTRAVENOUS | Status: DC
  Administered 2020-02-01 – 2020-02-04 (×7): 20 mg via INTRAVENOUS
  Filled 2020-02-01 (×7): qty 50

## 2020-02-01 MED ORDER — METOPROLOL TARTRATE 5 MG/5ML IV SOLN
2.5000 mg | Freq: Once | INTRAVENOUS | Status: AC
  Administered 2020-02-01: 2.5 mg via INTRAVENOUS
  Filled 2020-02-01: qty 5

## 2020-02-01 MED ORDER — CALCIUM GLUCONATE-NACL 1-0.675 GM/50ML-% IV SOLN
1.0000 g | Freq: Once | INTRAVENOUS | Status: AC
  Administered 2020-02-01: 1000 mg via INTRAVENOUS
  Filled 2020-02-01: qty 50

## 2020-02-01 MED ORDER — MORPHINE SULFATE (PF) 2 MG/ML IV SOLN
2.0000 mg | INTRAVENOUS | Status: DC | PRN
  Administered 2020-02-01 – 2020-02-02 (×3): 2 mg via INTRAVENOUS
  Filled 2020-02-01 (×4): qty 1

## 2020-02-01 MED ORDER — PANTOPRAZOLE SODIUM 40 MG IV SOLR
40.0000 mg | Freq: Two times a day (BID) | INTRAVENOUS | Status: DC
  Administered 2020-02-01 – 2020-02-03 (×5): 40 mg via INTRAVENOUS
  Filled 2020-02-01 (×5): qty 40

## 2020-02-01 MED ORDER — DEXTROSE 50 % IV SOLN: 0.0000 mL | INTRAVENOUS | Status: DC | PRN

## 2020-02-01 MED ORDER — LACTATED RINGERS IV BOLUS
500.0000 mL | INTRAVENOUS | Status: AC
  Administered 2020-02-01: 500 mL via INTRAVENOUS

## 2020-02-01 MED ORDER — MORPHINE SULFATE (PF) 4 MG/ML IV SOLN
4.0000 mg | Freq: Once | INTRAVENOUS | Status: AC
  Administered 2020-02-01: 4 mg via INTRAVENOUS
  Filled 2020-02-01: qty 1

## 2020-02-01 MED ORDER — CHLORHEXIDINE GLUCONATE CLOTH 2 % EX PADS
6.0000 | MEDICATED_PAD | Freq: Every day | CUTANEOUS | Status: DC
  Administered 2020-02-01 – 2020-02-03 (×2): 6 via TOPICAL

## 2020-02-01 MED ORDER — INSULIN REGULAR(HUMAN) IN NACL 100-0.9 UT/100ML-% IV SOLN
INTRAVENOUS | Status: DC
  Administered 2020-02-01: 13 [IU]/h via INTRAVENOUS
  Filled 2020-02-01: qty 100

## 2020-02-01 MED ORDER — LACTATED RINGERS IV BOLUS: 1000.0000 mL | INTRAVENOUS | Status: DC

## 2020-02-01 MED ORDER — SODIUM CHLORIDE 0.9 % IV BOLUS
500.0000 mL | Freq: Once | INTRAVENOUS | Status: AC
  Administered 2020-02-01: 500 mL via INTRAVENOUS

## 2020-02-01 MED ORDER — METOPROLOL TARTRATE 12.5 MG HALF TABLET
12.5000 mg | ORAL_TABLET | Freq: Two times a day (BID) | ORAL | Status: AC
  Administered 2020-02-01 – 2020-02-02 (×3): 12.5 mg via ORAL
  Filled 2020-02-01 (×3): qty 1

## 2020-02-01 MED ORDER — DEXTROSE IN LACTATED RINGERS 5 % IV SOLN: INTRAVENOUS | Status: DC

## 2020-02-01 MED ORDER — FENTANYL CITRATE (PF) 100 MCG/2ML IJ SOLN
50.0000 ug | Freq: Once | INTRAMUSCULAR | Status: AC
  Administered 2020-02-01: 50 ug via INTRAVENOUS
  Filled 2020-02-01: qty 2

## 2020-02-01 MED ORDER — INSULIN REGULAR(HUMAN) IN NACL 100-0.9 UT/100ML-% IV SOLN
INTRAVENOUS | Status: DC
  Administered 2020-02-01: 7 [IU]/h via INTRAVENOUS
  Administered 2020-02-01: 9.5 [IU]/h via INTRAVENOUS
  Filled 2020-02-01: qty 100

## 2020-02-01 MED ORDER — AMIODARONE HCL IN DEXTROSE 360-4.14 MG/200ML-% IV SOLN
60.0000 mg/h | INTRAVENOUS | Status: AC
  Administered 2020-02-01: 60 mg/h via INTRAVENOUS
  Filled 2020-02-01: qty 200

## 2020-02-01 MED ORDER — ONDANSETRON HCL 4 MG/2ML IJ SOLN
4.0000 mg | Freq: Four times a day (QID) | INTRAMUSCULAR | Status: DC | PRN
  Administered 2020-02-02: 4 mg via INTRAVENOUS
  Filled 2020-02-01: qty 2

## 2020-02-01 MED ORDER — DEXTROSE IN LACTATED RINGERS 5 % IV SOLN
INTRAVENOUS | Status: DC
  Filled 2020-02-01: qty 1000

## 2020-02-01 NOTE — ED Notes (Addendum)
Cardiologist paged to notify that patient converted to sinus rhythm at this time.

## 2020-02-01 NOTE — Plan of Care (Signed)
Will revisit patient in AM.   Making NPO after midnight for possible EGD tomorrow.

## 2020-02-01 NOTE — ED Notes (Signed)
Date and time results received: 02/01/20 9:16 AM   Test: Troponin  Critical Value: 393  Name of Provider Notified: Alfredia Ferguson, DO   Orders Received? Or Actions Taken?: Orders Received - See Orders for details

## 2020-02-01 NOTE — ED Notes (Signed)
Per MD hold off on transitioning patient off IV insulin

## 2020-02-01 NOTE — Progress Notes (Signed)
Care started prior to midnight in the emergency room and patient was admitted early this morning after midnight by Dr. Gean Birchwood and I am currently in agreement with this assessment and plan.  Additional changes to the plan of care been made accordingly.  Obese African-American male who is hearing impaired with a past medical history significant for not limited to nonischemic cardiomyopathy status post ICD placement, history of PE and DVT, history of diabetes mellitus type 2, deafness, history of pancreatitis, chronic combined systolic and diastolic CHF as well as other comorbidities who presented to the ED for chief complaint of nausea vomiting and hematemesis with epigastric discomfort last 24 hours.  He has had multiple episodes of frank hematemesis that has lasted for last few days and he is also noted some black stools.  The patient is on Eliquis for a history of PE and upon presentation to the ED he is found to be in atrial fibrillation with RVR with brief episodes of nonsustained ventricular tachycardia.  He was started on amiodarone drip given his history of heart failure as diltiazem and carvedilol cannot really be used.  He is found to be in DKA as well and had an elevated anion gap of 23.  CT of the abdomen pelvis was done and was unremarkable.  He was started on insulin drip for DKA protocol and amiodarone drip and Pepcid given that Protonix was not able to be used given the arrhythmia.  Currently he is being admitted and treated for the following but not limited to:  A. fib with RVR and nonsustained V. tach, new onset -Presently on amiodarone drip.  Will cycle cardiac markers and check thyroid function test.   -Patient is n.p.o. since patient is having intractable nausea vomiting.   -Cardiology has been notified.  -Not on anticoagulation and will hold his Eliquis since patient is having bleeding. -Patient's CHA2DS2-VASc score is elevated at least 3 -Cardiology consulted and recommending  IV amiodarone and holding carvedilol and digoxin for now and no diltiazem given his reduced EF next-cardiology will be adding low-dose oral metoprolol in the short-term 12.5 mg tartrate twice daily and recommending uptitrating as necessary and holding if blood pressure drops below 90 systolic -Cardiology recommending continuing to hold apixaban  Chronic systolic and diastolic CHF Nonischemic cardiomyopathy Elevated troponin -Cardiology recommending holding his heart failure medications at this time given his AKI and borderline blood pressures and recommending continue hydration given his ketosis and elevated creatinine -He does appear dry and cardiology recommending holding Lasix, Imdur and Entresto at this time -Elevated troponin is worsening and in the setting of demand ischemia from his A. fib with RVR; troponin has gone from 79 is now 393 -Patient is not complaining of any chest pain -Patient has low normal blood pressures  Acute GI bleed with hematemesis for which patient has been placed on Pepcid.   -Avoiding Protonix due to nonsustained V. tach.  Gastroenterologist has been notified through secure chat.  -Type and screen and follow CBCs -GI feels that his cardiac situation is most pressing matter at this time and would not pursue endoscopy today unless he has life-threatening bleeding; Dr. Paulita Fujita recommends holding anticoagulation, repleting his volume and ideally medically optimized and patient performing EGD once he is more stable next 1 to 2 days. -Gastroenterology recommending PPI and he did get IV pantoprazole 40 mg x 1 -Currently on IV famotidine 20 mg q. 12 but will discuss with cardiology about switching back to Protonix and they are ok with resuming  IV PPI  Abdominal pain with nausea vomiting and hematemesis -Gastroenterology consulted.  Lipase mildly elevated CT abdomen pelvis is unremarkable.   -Could be from peptic ulcer disease.  Follow lipase and LFTs.   -Presently  n.p.o. -Currently on IV famotidine and received IV PPI once -Continue supportive care and antiemetics at home have started IV Zofran and IV Compazine -Continue with with LR at 75 MLS per hour and then changed to D5 in LR once CBGs are less than 250 consistently  Diabetic ketoacidosis  -could be precipitated from nausea vomiting.  Presently on IV insulin drip  -follow metabolic panel closely. -CBGs ranging from 132-222 -Continue with insulin drip and fluids as above and changed from LR to D5 in LR once his blood sugars are less than 250 ne -CO2 is improved and last CO2 was 23, anion gap was 17, chloride level of 99; on admission his anion gap was 23 -Currently will not transition as patient is n.p.o. was having nausea or vomiting -Patient's beta hydroxybutyrate on admission was 4.82  Acute renal failure on chronic kidney disease stage IIIb with hyperkalemia probably precipitated from vomiting and dehydration.   -BUNs/creatinine is worsening and went from 25/1.86 and trended up to 31/2.31 and is now 31/2.59 -Hold his nephrotoxic medications and avoid contrast dyes and hypotension -Hyperkalemia has improved and potassium is now 3.8 -Renally adjust medications and continue to monitor renal function carefully and continue with supportive care and IV fluid hydration as above  History of PE  -holding of anticoagulation due to acute GI bleed.  History of deafness.   I used the interpreter [phone] test communicate and he states that he is not nauseous or vomiting now feels a little bit better but is very sleepy.  Normocytic Anemia -In the setting of his GI bleeding and vomiting hematemesis -Patient's hemoglobin/hematocrit went from 13.2/41.8 on admission is trended all the way down to 11.9 yesterday 0.07-likely also has partially dilutional drop given his fluid resuscitation  We will continue to monitor the patient's clinical response to intervention and follow-up on cardiology and  gastroenterology recommendations

## 2020-02-01 NOTE — ED Notes (Signed)
Pt brought back from CT.

## 2020-02-01 NOTE — ED Notes (Signed)
Report given to [employer]

## 2020-02-01 NOTE — ED Notes (Signed)
Pt is having runs of v tach MD made aware

## 2020-02-01 NOTE — ED Notes (Signed)
Pt. Is in bed resting, call light is  Within reach. Pt is improving

## 2020-02-01 NOTE — H&P (View-Only) (Signed)
[facility] Consultation Note  Referring Provider: [facility] Primary Care Physician:  [name], FNP  Reason for Consultation:  GI bleeding  HPI: Troy Rush is a 49 y.o. male with multiple medical problems as outlined below presenting with hematemesis.  On Eliquis.  Sign language interpreter used, but history limited as patient continues to fall asleep during my interview.  No further GI bleeding (no hematemesis) since in ED reportedly.  Denies prior episode of GI bleeding.   Past Medical History:  Diagnosis Date  . AICD (automatic cardioverter/defibrillator) present 10/14/2016  . Bilateral deafness   . Bronchitis   . CHF (congestive heart failure) ([place])   . Depression   . Diabetes mellitus without complication ([place])   . DVT, lower extremity ([place]) 07/12/2019  . Dyslipidemia   . Headache 08/2019  . Hypertension   . Insomnia 08/2019  . NICM (nonischemic cardiomyopathy) ([place]) 09/15/2016  . Pancreatitis   . Vitamin D deficiency 08/2019    Past Surgical History:  Procedure Laterality Date  . BACK SURGERY    . CARDIAC CATHETERIZATION    . ICD IMPLANT  10/14/2016  . ICD IMPLANT N/A 10/14/2016   Procedure: ICD Implant;  Surgeon: Deboraha Sprang, MD;  Location: [facility];  Service: Cardiovascular;  Laterality: N/A;  . WRIST SURGERY      Prior to Admission medications   Medication Sig Start Date End Date Taking? Authorizing Provider  apixaban (ELIQUIS) 5 MG TABS tablet Take 1 tablet (5 mg total) by mouth 2 (two) times daily. 09/06/19  Yes Larey Dresser, MD  atorvastatin (LIPITOR) 80 MG tablet TAKE 1 TABLET(80 MG) BY MOUTH DAILY 10/01/19  Yes Dorena Dew, FNP  carvedilol (COREG) 12.5 MG tablet Take 1.5 tablets (18.75 mg total) by mouth 2 (two) times daily. 08/17/18  Yes Larey Dresser, MD  citalopram (CELEXA) 40 MG tablet Take 40 mg by mouth daily.   Yes [provider]  digoxin (LANOXIN) 0.125 MG tablet Take 1 tablet (0.125 mg  total) by mouth daily. 08/27/18  Yes Georgiana Shore, NP  fenofibrate 54 MG tablet Take 1 tablet (54 mg total) by mouth daily. 12/11/19 03/10/20 Yes Larey Dresser, MD  furosemide (LASIX) 40 MG tablet Take 80 mg (2 tablet) every morning and 40 mg (1 tablet) every evening 10/07/19  Yes Larey Dresser, MD  icosapent Ethyl (VASCEPA) 1 g capsule Take 2 capsules (2 g total) by mouth 2 (two) times daily. 09/11/19  Yes Larey Dresser, MD  Insulin Glargine (LANTUS SOLOSTAR) 100 UNIT/ML Solostar Pen Inject 40 Units into the skin daily at 10 pm. Patient taking differently: Inject 35 Units into the skin daily at 10 pm.  07/13/18  Yes Lanae Boast, FNP  INVOKANA 300 MG TABS tablet TAKE 1 TABLET(300 MG) BY MOUTH DAILY BEFORE BREAKFAST 10/02/19  Yes Vevelyn Francois, NP  isosorbide mononitrate (IMDUR) 30 MG 24 hr tablet Take 1 tablet (30 mg total) by mouth daily. 09/06/19 02/01/20 Yes Larey Dresser, MD  metFORMIN (GLUCOPHAGE) 1000 MG tablet TAKE 1 TABLET(1000 MG) BY MOUTH TWICE DAILY WITH A MEAL 10/01/19  Yes Dorena Dew, FNP  sacubitril-valsartan (ENTRESTO) 24-26 MG Take 1 tablet by mouth 2 (two) times daily. 09/06/19  Yes Larey Dresser, MD  topiramate (TOPAMAX) 50 MG tablet TAKE 1 TABLET BY MOUTH TWICE DAILY AS NEEDED FOR HEADACHE 10/31/19  Yes [name], FNP  traZODone (DESYREL) 100 MG tablet Take 1 tablet (100 mg total)  by mouth at bedtime. 08/13/19  Yes [name], FNP  Vitamin D, Ergocalciferol, (DRISDOL) 1.25 MG (50000 UNIT) CAPS capsule Take 1 capsule (50,000 Units total) by mouth every 7 (seven) days. 08/19/19  Yes [name], FNP    Current Facility-Administered Medications  Medication Dose Route Frequency Provider Last Rate Last Admin  . 0.9 %  sodium chloride infusion   Intravenous Continuous Rise Patience, MD   Stopped at 01/31/20 2333  . amiodarone (NEXTERONE PREMIX) 360-4.14 MG/200ML-% (1.8 mg/mL) IV infusion  60 mg/hr Intravenous Continuous Rise Patience, MD  33.3 mL/hr at 02/01/20 0328 60 mg/hr at 02/01/20 0328   Followed by  . amiodarone (NEXTERONE PREMIX) 360-4.14 MG/200ML-% (1.8 mg/mL) IV infusion  30 mg/hr Intravenous Continuous Rise Patience, MD      . dextrose 5 % in lactated ringers infusion   Intravenous Continuous Rise Patience, MD 75 mL/hr at 02/01/20 0615 New Bag at 02/01/20 0615  . dextrose 50 % solution 0-50 mL  0-50 mL Intravenous PRN Rise Patience, MD      . famotidine (PEPCID) IVPB 20 mg premix  20 mg Intravenous Q12H Rise Patience, MD   Stopped at 02/01/20 [phone]  . insulin regular, human (MYXREDLIN) 100 units/ 100 mL infusion   Intravenous Continuous Rise Patience, MD 7 mL/hr at 02/01/20 0834 7 Units/hr at 02/01/20 0834  . lactated ringers infusion   Intravenous Continuous Rise Patience, MD   Held at 02/01/20 [phone]  . ondansetron (ZOFRAN) injection 4 mg  4 mg Intravenous Q6H PRN Sheikh, Georgina Quint Latif, DO      . prochlorperazine (COMPAZINE) injection 10 mg  10 mg Intravenous Q6H PRN Raiford Noble Latif, DO   10 mg at 02/01/20 0745   Current Outpatient Medications  Medication Sig Dispense Refill  . apixaban (ELIQUIS) 5 MG TABS tablet Take 1 tablet (5 mg total) by mouth 2 (two) times daily. 60 tablet 4  . atorvastatin (LIPITOR) 80 MG tablet TAKE 1 TABLET(80 MG) BY MOUTH DAILY 90 tablet 3  . carvedilol (COREG) 12.5 MG tablet Take 1.5 tablets (18.75 mg total) by mouth 2 (two) times daily. 240 tablet 3  . citalopram (CELEXA) 40 MG tablet Take 40 mg by mouth daily.    . digoxin (LANOXIN) 0.125 MG tablet Take 1 tablet (0.125 mg total) by mouth daily. 90 tablet 3  . fenofibrate 54 MG tablet Take 1 tablet (54 mg total) by mouth daily. 30 tablet 2  . furosemide (LASIX) 40 MG tablet Take 80 mg (2 tablet) every morning and 40 mg (1 tablet) every evening 270 tablet 3  . icosapent Ethyl (VASCEPA) 1 g capsule Take 2 capsules (2 g total) by mouth 2 (two) times daily. 120 capsule 5  . Insulin Glargine (LANTUS  SOLOSTAR) 100 UNIT/ML Solostar Pen Inject 40 Units into the skin daily at 10 pm. (Patient taking differently: Inject 35 Units into the skin daily at 10 pm. ) 15 pen 3  . INVOKANA 300 MG TABS tablet TAKE 1 TABLET(300 MG) BY MOUTH DAILY BEFORE BREAKFAST 90 tablet 3  . isosorbide mononitrate (IMDUR) 30 MG 24 hr tablet Take 1 tablet (30 mg total) by mouth daily. 90 tablet 3  . metFORMIN (GLUCOPHAGE) 1000 MG tablet TAKE 1 TABLET(1000 MG) BY MOUTH TWICE DAILY WITH A MEAL 180 tablet 3  . sacubitril-valsartan (ENTRESTO) 24-26 MG Take 1 tablet by mouth 2 (two) times daily. 60 tablet 5  . topiramate (TOPAMAX) 50 MG tablet TAKE 1  TABLET BY MOUTH TWICE DAILY AS NEEDED FOR HEADACHE 60 tablet 2  . traZODone (DESYREL) 100 MG tablet Take 1 tablet (100 mg total) by mouth at bedtime. 30 tablet 3  . Vitamin D, Ergocalciferol, (DRISDOL) 1.25 MG (50000 UNIT) CAPS capsule Take 1 capsule (50,000 Units total) by mouth every 7 (seven) days. 5 capsule 6    Allergies as of 01/31/2020 - Review Complete 01/31/2020  Allergen Reaction Noted  . Lisinopril Cough 01/21/2016    Family History  Problem Relation Age of Onset  . Hypertension Mother   . Healthy Father   . Stroke Father     Social History   Socioeconomic History  . Marital status: Legally Separated    Spouse name: Not on file  . Number of children: Not on file  . Years of education: Not on file  . Highest education level: Not on file  Occupational History  . Not on file  Tobacco Use  . Smoking status: Never Smoker  . Smokeless tobacco: Never Used  Vaping Use  . Vaping Use: Never used  Substance and Sexual Activity  . Alcohol use: Yes    Alcohol/week: 1.0 - 2.0 standard drink    Types: 1 - 2 Glasses of wine per week    Comment: rare  . Drug use: No  . Sexual activity: Yes  Other Topics Concern  . Not on file  Social History Narrative  . Not on file   Social Determinants of Health   Financial Resource Strain:   . Difficulty of Paying  Living Expenses: Not on file  Food Insecurity:   . Worried About Charity fundraiser in the Last Year: Not on file  . Ran Out of Food in the Last Year: Not on file  Transportation Needs:   . Lack of Transportation (Medical): Not on file  . Lack of Transportation (Non-Medical): Not on file  Physical Activity:   . Days of Exercise per Week: Not on file  . Minutes of Exercise per Session: Not on file  Stress:   . Feeling of Stress : Not on file  Social Connections:   . Frequency of Communication with Friends and Family: Not on file  . Frequency of Social Gatherings with Friends and Family: Not on file  . Attends Religious Services: Not on file  . Active Member of Clubs or Organizations: Not on file  . Attends Archivist Meetings: Not on file  . Marital Status: Not on file  Intimate Partner Violence:   . Fear of Current or Ex-Partner: Not on file  . Emotionally Abused: Not on file  . Physically Abused: Not on file  . Sexually Abused: Not on file    Review of Systems: As per HPI, all others negative  Physical Exam: Vital signs in last 24 hours: Temp:  [98.3 F (36.8 C)-98.9 F (37.2 C)] 98.9 F (37.2 C) (10/23 0701) Pulse Rate:  [41-165] 155 (10/23 0840) Resp:  [13-30] 15 (10/23 0840) BP: (83-217)/(63-173) 138/121 (10/23 0840) SpO2:  [92 %-100 %] 96 % (10/23 0840) Weight:  [99.8 kg] 99.8 kg (10/22 2158)   General:   Alert,  Somnolent, falls asleep quickly Head:  Normocephalic and atraumatic. Eyes:  Sclera clear, no icterus.   Some bilateral orbital edema Ears:  Normal auditory acuity. Nose:  No deformity, discharge,  or lesions. Mouth:  No deformity or lesions.  Oropharynx pink & moist. Neck:  Supple; no masses or thyromegaly. Heart:  Tachycardic, irregular Abdomen:  Soft, nontender  and nondistended. No masses, hepatosplenomegaly or hernias noted. Normal bowel sounds, without guarding, and without rebound.     Msk:  Symmetrical without gross deformities. Normal  posture. Pulses:  Normal pulses noted. Extremities:  Without clubbing or edema. Neurologic:  Alert, but quickly somnolent without stimulation Skin:  Intact without significant lesions or rashes. Cervical Nodes:  No significant cervical adenopathy Psych:  Alert and cooperative. Normal mood and affect.   Lab Results: Recent Labs    01/31/20 2217 02/01/20 0520 02/01/20 0800  WBC 4.9 7.0 6.3  HGB 13.2 12.4* 11.9*  HCT 41.8 38.9* 38.0*  PLT 270 283 239   BMET Recent Labs    01/31/20 2317 02/01/20 0520  NA 138 141  K 5.6* 4.0  CL 94* 98  CO2 21* 22  GLUCOSE 381* 240*  BUN 25* 31*  CREATININE 1.86* 2.31*  CALCIUM 9.0 9.2   LFT Recent Labs    01/31/20 2317  PROT 7.5  ALBUMIN 3.5  AST 39  ALT 24  ALKPHOS 59  BILITOT 2.1*   PT/INR No results for input(s): LABPROT, INR in the last 72 hours.  Studies/Results: CT Abdomen Pelvis Wo Contrast  Result Date: 02/01/2020 CLINICAL DATA:  Abdominal pain and vomiting EXAM: CT ABDOMEN AND PELVIS WITHOUT CONTRAST TECHNIQUE: Multidetector CT imaging of the abdomen and pelvis was performed following the standard protocol without IV contrast. COMPARISON:  Aug 19, 2019 FINDINGS: Lower chest: There is mild cardiomegaly present. No hiatal hernia. The visualized portions of the lungs are clear. Hepatobiliary: Although limited due to the lack of intravenous contrast, normal in appearance without gross focal abnormality. No evidence of calcified gallstones or biliary ductal dilatation. Pancreas:  Unremarkable.  No surrounding inflammatory changes. Spleen: Normal in size. Although limited due to the lack of intravenous contrast, normal in appearance. Adrenals/Urinary Tract: Both adrenal glands appear normal. The kidneys and collecting system appear normal without evidence of urinary tract calculus or hydronephrosis. Bladder is unremarkable. Stomach/Bowel: The stomach, small bowel, and colon are normal in appearance. No inflammatory changes or  obstructive findings. A moderate amount of colonic stool is present. Appendix is normal. Vascular/Lymphatic: There are no enlarged abdominal or pelvic lymph nodes. Scattered mild aortic atherosclerotic calcifications are seen without aneurysmal dilatation. Reproductive: The prostate is unremarkable. Other: No evidence of abdominal wall mass or hernia. Musculoskeletal: No acute or significant osseous findings. IMPRESSION: No acute intra-abdominal or pelvic pathology to explain the patient's symptoms. Moderate amount of colonic stool Aortic Atherosclerosis (ICD10-I70.0). Electronically Signed   By: Prudencio Pair M.D.   On: 02/01/2020 02:37   DG ABD ACUTE 2+V W 1V CHEST  Result Date: 01/31/2020 CLINICAL DATA:  Abdominal pain and vomiting starting today. EXAM: DG ABDOMEN ACUTE WITH 1 VIEW CHEST COMPARISON:  Chest 08/18/2019.  CT abdomen and pelvis 08/19/2019 FINDINGS: Cardiac pacemaker. Shallow inspiration. Heart size and pulmonary vascularity are normal for technique. Lungs are clear. No pleural effusions. No pneumothorax. Scattered gas and stool throughout the colon. No small or large bowel distention. No free intra-abdominal air. No abnormal air-fluid levels. No radiopaque stones. Calcified phleboliths in the pelvis. Visualized bones and soft tissue contours are normal. IMPRESSION: No evidence of active pulmonary disease. Nonobstructive bowel gas pattern. Electronically Signed   By: Lucienne Capers M.D.   On: 01/31/2020 23:52    Impression:  1.  Hematemesis.  None since admission. 2.  Afib with RVR, some non-sustained VT. 3.  Chronic anticoagulation, Eliquis, history PE. 4.  NICM s/p AICD. 5.  Hearing impaired.  Plan:  1.  Patient can provide little history for me, he is somnolent and keeps falling asleep. 2.  Cardiac situation is most pressing matter; cardiology team is in process of evaluating patient. 3.  I would not pursue endoscopy today unless he has life-threatening bleeding, given his  improvement of bleeding, current cardiac instability and recent intake of Eliquis.  And should patient require endoscopy today, he would have to be intubated and procedure done in ICU setting. 4.  Hold anticoagulation. 5.  Volume replete. 6.  PPI. 7.  Would ideally medically optimize patient and then perform EGD once more stable in the next 1-2 days. 8.  Case discussed with Dr. Harrell Gave (cardiology). 9.  [facility] will follow.   LOS: 0 days   Derriana Oser M  02/01/2020, 8:55 AM  Cell [phone] If no answer or after 5 PM call [phone]

## 2020-02-01 NOTE — ED Provider Notes (Signed)
8:15 AM-cardiac monitor alarming with heart rate 165 bpm.  EKG ordered, indicates A. fib with RVR, at 819 a.m.  Blood pressure 481 systolic, at 8:59 AM.  Dose of Lopressor ordered.  Per nursing patient's heart rate has been 150-160, since she arrived around 7 AM.  Currently on amiodarone drip.  Clinical Course as of Jan 31 854  Sat Feb 01, 2020  0834 At this time blood pressure 093 systolic, heart rate 112-162, irregular.  EKG indicates atrial fibrillation with RVR.  Patient on amiodarone drip, due to be lowered at 9 AM.  Will give a dose of Lopressor at this time, and I have asked nursing to contact his providers for additional management decisions.   [EW]  0841 Heart rate 125-130, after low-dose Lopressor.  Currently cardiology service in the room evaluating the patient.   [EW]    Clinical Course User Index [EW] Daleen Bo, MD     Daleen Bo, MD 02/01/20 [phone]

## 2020-02-01 NOTE — ED Notes (Signed)
MD at bedside due to pt. HR in the 160's

## 2020-02-01 NOTE — ED Notes (Signed)
Bladder scanner showed 108 ml urine

## 2020-02-01 NOTE — H&P (Signed)
History and Physical    Troy Rush PXT:[id number] DOB: 01/16/1971 DOA: 01/31/2020  PCP: [name], FNP  Patient coming from: Home.  Sign language interpreter used.  Chief Complaint: Abdominal pain nausea vomiting hematemesis.  HPI: Troy Rush is a 49 y.o. male with history of nonischemic cardiomyopathy status post ICD placement, history of PE/DVT, diabetes mellitus type 2, deafness previous history of pancreatitis presents to the ER with complaints of nausea vomiting with hematemesis with epigastric discomfort for the last 24 hours.  Patient states he has been a multiple episodes of frank hematemesis.  Last 2 days he has been having constipation prior to that he noticed some black stools.  Patient is on Eliquis for previous history of PE.  Denies using any NSAIDs.  After coming to the ER patient has developed some chest pressure.  ED Course: In the ER patient patient went to A. fib with RVR then he also went into brief episodes of nonsustained V. tach.  Cardiologist on-call was consulted patient was placed on amiodarone drip.  Labs are significant for blood glucose of 381 anion gap of 23 creatinine 1.8 bicarb of 21 potassium 5.6 lipase of 73 AST ALT was normal.  CT abdomen pelvis was unremarkable.  Patient was started on IV insulin for DKA protocol and amiodarone drip and Pepcid.  Review of Systems: As per HPI, rest all negative.   Past Medical History:  Diagnosis Date  . AICD (automatic cardioverter/defibrillator) present 10/14/2016  . Bilateral deafness   . Bronchitis   . CHF (congestive heart failure) ([place])   . Depression   . Diabetes mellitus without complication ([place])   . DVT, lower extremity ([place]) 07/12/2019  . Dyslipidemia   . Headache 08/2019  . Hypertension   . Insomnia 08/2019  . NICM (nonischemic cardiomyopathy) ([place]) 09/15/2016  . Pancreatitis   . Vitamin D deficiency 08/2019    Past Surgical History:  Procedure Laterality Date  . BACK SURGERY    .  CARDIAC CATHETERIZATION    . ICD IMPLANT  10/14/2016  . ICD IMPLANT N/A 10/14/2016   Procedure: ICD Implant;  Surgeon: Deboraha Sprang, MD;  Location: [facility];  Service: Cardiovascular;  Laterality: N/A;  . WRIST SURGERY       reports that he has never smoked. He has never used smokeless tobacco. He reports current alcohol use of about 1.0 - 2.0 standard drink of alcohol per week. He reports that he does not use drugs.  Allergies  Allergen Reactions  . Lisinopril Cough    Family History  Problem Relation Age of Onset  . Hypertension Mother   . Healthy Father   . Stroke Father     Prior to Admission medications   Medication Sig Start Date End Date Taking? Authorizing Provider  apixaban (ELIQUIS) 5 MG TABS tablet Take 1 tablet (5 mg total) by mouth 2 (two) times daily. 09/06/19  Yes Larey Dresser, MD  atorvastatin (LIPITOR) 80 MG tablet TAKE 1 TABLET(80 MG) BY MOUTH DAILY 10/01/19  Yes Dorena Dew, FNP  carvedilol (COREG) 12.5 MG tablet Take 1.5 tablets (18.75 mg total) by mouth 2 (two) times daily. 08/17/18  Yes Larey Dresser, MD  citalopram (CELEXA) 40 MG tablet Take 40 mg by mouth daily.   Yes [provider]  digoxin (LANOXIN) 0.125 MG tablet Take 1 tablet (0.125 mg total) by mouth daily. 08/27/18  Yes Georgiana Shore, NP  fenofibrate 54 MG tablet Take 1 tablet (54  mg total) by mouth daily. 12/11/19 03/10/20 Yes Larey Dresser, MD  furosemide (LASIX) 40 MG tablet Take 80 mg (2 tablet) every morning and 40 mg (1 tablet) every evening 10/07/19  Yes Larey Dresser, MD  icosapent Ethyl (VASCEPA) 1 g capsule Take 2 capsules (2 g total) by mouth 2 (two) times daily. 09/11/19  Yes Larey Dresser, MD  Insulin Glargine (LANTUS SOLOSTAR) 100 UNIT/ML Solostar Pen Inject 40 Units into the skin daily at 10 pm. Patient taking differently: Inject 35 Units into the skin daily at 10 pm.  07/13/18  Yes Lanae Boast, FNP  INVOKANA 300 MG TABS tablet TAKE 1 TABLET(300 MG) BY  MOUTH DAILY BEFORE BREAKFAST 10/02/19  Yes Vevelyn Francois, NP  isosorbide mononitrate (IMDUR) 30 MG 24 hr tablet Take 1 tablet (30 mg total) by mouth daily. 09/06/19 02/01/20 Yes Larey Dresser, MD  metFORMIN (GLUCOPHAGE) 1000 MG tablet TAKE 1 TABLET(1000 MG) BY MOUTH TWICE DAILY WITH A MEAL 10/01/19  Yes Dorena Dew, FNP  sacubitril-valsartan (ENTRESTO) 24-26 MG Take 1 tablet by mouth 2 (two) times daily. 09/06/19  Yes Larey Dresser, MD  topiramate (TOPAMAX) 50 MG tablet TAKE 1 TABLET BY MOUTH TWICE DAILY AS NEEDED FOR HEADACHE 10/31/19  Yes [name], FNP  traZODone (DESYREL) 100 MG tablet Take 1 tablet (100 mg total) by mouth at bedtime. 08/13/19  Yes [name], FNP  Vitamin D, Ergocalciferol, (DRISDOL) 1.25 MG (50000 UNIT) CAPS capsule Take 1 capsule (50,000 Units total) by mouth every 7 (seven) days. 08/19/19  Yes [name], FNP    Physical Exam: Constitutional: Moderately built and nourished. Vitals:   02/01/20 0345 02/01/20 0400 02/01/20 0415 02/01/20 0453  BP:  99/79  102/71  Pulse: 70 (!) 142 98 70  Resp: 19 20 17 18   Temp:    98.9 F (37.2 C)  TempSrc:    Oral  SpO2: 96% 93% 99% 98%  Weight:      Height:       Eyes: Anicteric no pallor. ENMT: No discharge from the ears eyes nose or mouth. Neck: No mass felt.  No neck rigidity. Respiratory: No rhonchi or crepitations. Cardiovascular: S1-S2 heard. Abdomen: Epigastric tenderness no guarding or rigidity. Musculoskeletal: No edema. Skin: No rash. Neurologic: Alert awake oriented to time place and person.  Moves all extremities. Psychiatric: Appears normal.  Normal affect.   Labs on Admission: I have personally reviewed following labs and imaging studies  CBC: Recent Labs  Lab 01/31/20 2217  WBC 4.9  NEUTROABS 3.8  HGB 13.2  HCT 41.8  MCV 93.7  PLT 660   Basic Metabolic Panel: Recent Labs  Lab 01/31/20 2317  NA 138  K 5.6*  CL 94*  CO2 21*  GLUCOSE 381*  BUN 25*  CREATININE  1.86*  CALCIUM 9.0  MG 1.8   GFR: Estimated Creatinine Clearance: 56.3 mL/min (A) (by C-G formula based on SCr of 1.86 mg/dL (H)). Liver Function Tests: Recent Labs  Lab 01/31/20 2317  AST 39  ALT 24  ALKPHOS 59  BILITOT 2.1*  PROT 7.5  ALBUMIN 3.5   Recent Labs  Lab 01/31/20 2317  LIPASE 73*   No results for input(s): AMMONIA in the last 168 hours. Coagulation Profile: No results for input(s): INR, PROTIME in the last 168 hours. Cardiac Enzymes: No results for input(s): CKTOTAL, CKMB, CKMBINDEX, TROPONINI in the last 168 hours. BNP (last 3 results) No results for input(s): PROBNP in the last 8760  hours. HbA1C: No results for input(s): HGBA1C in the last 72 hours. CBG: Recent Labs  Lab 02/01/20 0047 02/01/20 0206 02/01/20 0319 02/01/20 0452  GLUCAP 385* 304* 190* 201*   Lipid Profile: No results for input(s): CHOL, HDL, LDLCALC, TRIG, CHOLHDL, LDLDIRECT in the last 72 hours. Thyroid Function Tests: No results for input(s): TSH, T4TOTAL, FREET4, T3FREE, THYROIDAB in the last 72 hours. Anemia Panel: No results for input(s): VITAMINB12, FOLATE, FERRITIN, TIBC, IRON, RETICCTPCT in the last 72 hours. Urine analysis:    Component Value Date/Time   COLORURINE YELLOW 02/01/2020 [name] 02/01/2020 0137   LABSPEC 1.030 02/01/2020 0137   PHURINE 5.0 02/01/2020 0137   GLUCOSEU >=500 (A) 02/01/2020 0137   HGBUR MODERATE (A) 02/01/2020 0137   BILIRUBINUR NEGATIVE 02/01/2020 0137   BILIRUBINUR Negative 08/13/2019 1103   KETONESUR 20 (A) 02/01/2020 0137   PROTEINUR >=300 (A) 02/01/2020 0137   UROBILINOGEN 0.2 08/13/2019 1103   UROBILINOGEN 0.2 05/09/2017 1130   NITRITE NEGATIVE 02/01/2020 0137   LEUKOCYTESUR NEGATIVE 02/01/2020 0137   Sepsis Labs: @LABRCNTIP (procalcitonin:4,lacticidven:4) ) Recent Results (from the past 240 hour(s))  Respiratory Panel by RT PCR (Flu A&B, Covid) - Nasopharyngeal Swab     Status: None   Collection Time: 02/01/20   1:38 AM   Specimen: Nasopharyngeal Swab  Result Value Ref Range Status   SARS Coronavirus 2 by RT PCR NEGATIVE NEGATIVE Final    Comment: (NOTE) SARS-CoV-2 target nucleic acids are NOT DETECTED.  The SARS-CoV-2 RNA is generally detectable in upper respiratoy specimens during the acute phase of infection. The lowest concentration of SARS-CoV-2 viral copies this assay can detect is 131 copies/mL. A negative result does not preclude SARS-Cov-2 infection and should not be used as the sole basis for treatment or other patient management decisions. A negative result may occur with  improper specimen collection/handling, submission of specimen other than nasopharyngeal swab, presence of viral mutation(s) within the areas targeted by this assay, and inadequate number of viral copies (<131 copies/mL). A negative result must be combined with clinical observations, patient history, and epidemiological information. The expected result is Negative.  Fact Sheet for Patients:  PinkCheek.be  Fact Sheet for Healthcare Providers:  GravelBags.it  This test is no t yet approved or cleared by the [place] FDA and  has been authorized for detection and/or diagnosis of SARS-CoV-2 by FDA under an Emergency Use Authorization (EUA). This EUA will remain  in effect (meaning this test can be used) for the duration of the COVID-19 declaration under Section 564(b)(1) of the Act, 21 U.S.C. section 360bbb-3(b)(1), unless the authorization is terminated or revoked sooner.     Influenza A by PCR NEGATIVE NEGATIVE Final   Influenza B by PCR NEGATIVE NEGATIVE Final    Comment: (NOTE) The Xpert Xpress SARS-CoV-2/FLU/RSV assay is intended as an aid in  the diagnosis of influenza from Nasopharyngeal swab specimens and  should not be used as a sole basis for treatment. Nasal washings and  aspirates are unacceptable for Xpert Xpress SARS-CoV-2/FLU/RSV    testing.  Fact Sheet for Patients: PinkCheek.be  Fact Sheet for Healthcare Providers: GravelBags.it  This test is not yet approved or cleared by the [place] FDA and  has been authorized for detection and/or diagnosis of SARS-CoV-2 by  FDA under an Emergency Use Authorization (EUA). This EUA will remain  in effect (meaning this test can be used) for the duration of the  Covid-19 declaration under Section 564(b)(1) of the Act, 21  U.S.C.  section 360bbb-3(b)(1), unless the authorization is  terminated or revoked. Performed at [facility], [place] [address], [place],  [address]      Radiological Exams on Admission: CT Abdomen Pelvis Wo Contrast  Result Date: 02/01/2020 CLINICAL DATA:  Abdominal pain and vomiting EXAM: CT ABDOMEN AND PELVIS WITHOUT CONTRAST TECHNIQUE: Multidetector CT imaging of the abdomen and pelvis was performed following the standard protocol without IV contrast. COMPARISON:  Aug 19, 2019 FINDINGS: Lower chest: There is mild cardiomegaly present. No hiatal hernia. The visualized portions of the lungs are clear. Hepatobiliary: Although limited due to the lack of intravenous contrast, normal in appearance without gross focal abnormality. No evidence of calcified gallstones or biliary ductal dilatation. Pancreas:  Unremarkable.  No surrounding inflammatory changes. Spleen: Normal in size. Although limited due to the lack of intravenous contrast, normal in appearance. Adrenals/Urinary Tract: Both adrenal glands appear normal. The kidneys and collecting system appear normal without evidence of urinary tract calculus or hydronephrosis. Bladder is unremarkable. Stomach/Bowel: The stomach, small bowel, and colon are normal in appearance. No inflammatory changes or obstructive findings. A moderate amount of colonic stool is present. Appendix is normal. Vascular/Lymphatic: There are no enlarged  abdominal or pelvic lymph nodes. Scattered mild aortic atherosclerotic calcifications are seen without aneurysmal dilatation. Reproductive: The prostate is unremarkable. Other: No evidence of abdominal wall mass or hernia. Musculoskeletal: No acute or significant osseous findings. IMPRESSION: No acute intra-abdominal or pelvic pathology to explain the patient's symptoms. Moderate amount of colonic stool Aortic Atherosclerosis (ICD10-I70.0). Electronically Signed   By: Prudencio Pair M.D.   On: 02/01/2020 02:37   DG ABD ACUTE 2+V W 1V CHEST  Result Date: 01/31/2020 CLINICAL DATA:  Abdominal pain and vomiting starting today. EXAM: DG ABDOMEN ACUTE WITH 1 VIEW CHEST COMPARISON:  Chest 08/18/2019.  CT abdomen and pelvis 08/19/2019 FINDINGS: Cardiac pacemaker. Shallow inspiration. Heart size and pulmonary vascularity are normal for technique. Lungs are clear. No pleural effusions. No pneumothorax. Scattered gas and stool throughout the colon. No small or large bowel distention. No free intra-abdominal air. No abnormal air-fluid levels. No radiopaque stones. Calcified phleboliths in the pelvis. Visualized bones and soft tissue contours are normal. IMPRESSION: No evidence of active pulmonary disease. Nonobstructive bowel gas pattern. Electronically Signed   By: Lucienne Capers M.D.   On: 01/31/2020 23:52    EKG: Independently reviewed.  Sinus tachycardia.  Assessment/Plan Principal Problem:   Atrial fibrillation with RVR ([facility]) Active Problems:   Deaf   NICM (nonischemic cardiomyopathy) ([place])   NSVT (nonsustained ventricular tachycardia) ([facility])   Acute GI bleeding   DKA (diabetic ketoacidosis) ([place])    1. A. fib with RVR and nonsustained V. tach presently on amiodarone drip.  Will cycle cardiac markers and check thyroid function test.  Patient is n.p.o. since patient is having intractable nausea vomiting.  Cardiology has been notified.  Not on anticoagulation since patient is having bleeding. 2. Acute GI  bleed with hematemesis for which patient has been placed on Pepcid.  Avoiding Protonix due to nonsustained V. tach.  Gastroenterologist has been notified through secure chat.  Follow CBC.  Type and screen. 3. Abdominal pain consulted.  Lipase mildly elevated CT abdomen pelvis is unremarkable.  Could be from peptic ulcer disease.  Follow lipase and LFTs.  Presently n.p.o. 4. History of nonischemic cardiomyopathy presently receiving fluids holding Entresto and diuretics due to low normal blood pressure readings.  Patient is on amiodarone. 5. Diabetic ketoacidosis could be precipitated from nausea vomiting.  Presently on IV insulin drip follow metabolic panel closely. 6. Acute renal failure on chronic kidney disease stage III with hyperkalemia probably precipitated from vomiting and dehydration.  I think hyperkalemia will get corrected with IV insulin.  Follow metabolic panel closely. 7. History of PE holding of anticoagulation due to acute GI bleed. 8. History of deafness.  Since patient is acutely sick with A. fib with RVR acute GI bleed DKA acute renal failure will need close monitoring for any further worsening in inpatient status.   DVT prophylaxis: SCDs.  Avoiding anticoagulation due due to acute GI bleed. Code Status: Full code. Family Communication: Discussed with patient. Disposition Plan: Home. Consults called: Cardiology.  GI. Admission status: Inpatient.   Rise Patience MD [facility] Pager [phone].  If 7PM-7AM, please contact night-coverage [email or web address] Password [email or web address]  02/01/2020, 5:07 AM

## 2020-02-01 NOTE — ED Notes (Signed)
Patient placed on hospital bed.

## 2020-02-01 NOTE — ED Notes (Signed)
Patient restless in bed and pulled out both the left hand peripheral IV and the right wrist peripheral. Nurse currently placing two new ones.

## 2020-02-01 NOTE — Plan of Care (Signed)
  Problem: Education: Goal: Ability to describe self-care measures that may prevent or decrease complications (Diabetes Survival Skills Education) will improve Outcome: Progressing Goal: Individualized Educational Video(s) Outcome: Progressing   Problem: Cardiac: Goal: Ability to maintain an adequate cardiac output will improve Outcome: Progressing   Problem: Health Behavior/Discharge Planning: Goal: Ability to identify and utilize available resources and services will improve Outcome: Progressing Goal: Ability to manage health-related needs will improve Outcome: Progressing   Problem: Fluid Volume: Goal: Ability to achieve a balanced intake and output will improve Outcome: Progressing   Problem: Metabolic: Goal: Ability to maintain appropriate glucose levels will improve Outcome: Progressing   Problem: Nutritional: Goal: Maintenance of adequate nutrition will improve Outcome: Progressing Goal: Maintenance of adequate weight for body size and type will improve Outcome: Progressing   Problem: Urinary Elimination: Goal: Ability to achieve and maintain adequate renal perfusion and functioning will improve Outcome: Progressing   Problem: Education: Goal: Knowledge of General Education information will improve Description: Including pain rating scale, medication(s)/side effects and non-pharmacologic comfort measures Outcome: Progressing   Problem: Health Behavior/Discharge Planning: Goal: Ability to manage health-related needs will improve Outcome: Progressing   Problem: Clinical Measurements: Goal: Ability to maintain clinical measurements within normal limits will improve Outcome: Progressing Goal: Will remain free from infection Outcome: Progressing Goal: Diagnostic test results will improve Outcome: Progressing Goal: Respiratory complications will improve Outcome: Progressing Goal: Cardiovascular complication will be avoided Outcome: Progressing   Problem:  Activity: Goal: Risk for activity intolerance will decrease Outcome: Progressing   Problem: Nutrition: Goal: Adequate nutrition will be maintained Outcome: Progressing   Problem: Coping: Goal: Level of anxiety will decrease Outcome: Progressing   Problem: Elimination: Goal: Will not experience complications related to bowel motility Outcome: Progressing Goal: Will not experience complications related to urinary retention Outcome: Progressing   Problem: Pain Managment: Goal: General experience of comfort will improve Outcome: Progressing   Problem: Safety: Goal: Ability to remain free from injury will improve Outcome: Progressing   Problem: Skin Integrity: Goal: Risk for impaired skin integrity will decrease Outcome: Progressing   Problem: Education: Goal: Knowledge of disease or condition will improve Outcome: Progressing Goal: Understanding of medication regimen will improve Outcome: Progressing Goal: Individualized Educational Video(s) Outcome: Progressing   Problem: Cardiac: Goal: Ability to achieve and maintain adequate cardiopulmonary perfusion will improve Outcome: Progressing   Problem: Health Behavior/Discharge Planning: Goal: Ability to safely manage health-related needs after discharge will improve Outcome: Progressing

## 2020-02-01 NOTE — ED Notes (Addendum)
Ultrasound at bedside

## 2020-02-01 NOTE — Consult Note (Signed)
[facility] Consultation Note  Referring Provider: [facility] Primary Care Physician:  [name], FNP  Reason for Consultation:  GI bleeding  HPI: Troy Rush is a 49 y.o. male with multiple medical problems as outlined below presenting with hematemesis.  On Eliquis.  Sign language interpreter used, but history limited as patient continues to fall asleep during my interview.  No further GI bleeding (no hematemesis) since in ED reportedly.  Denies prior episode of GI bleeding.   Past Medical History:  Diagnosis Date  . AICD (automatic cardioverter/defibrillator) present 10/14/2016  . Bilateral deafness   . Bronchitis   . CHF (congestive heart failure) ([place])   . Depression   . Diabetes mellitus without complication ([place])   . DVT, lower extremity ([place]) 07/12/2019  . Dyslipidemia   . Headache 08/2019  . Hypertension   . Insomnia 08/2019  . NICM (nonischemic cardiomyopathy) ([place]) 09/15/2016  . Pancreatitis   . Vitamin D deficiency 08/2019    Past Surgical History:  Procedure Laterality Date  . BACK SURGERY    . CARDIAC CATHETERIZATION    . ICD IMPLANT  10/14/2016  . ICD IMPLANT N/A 10/14/2016   Procedure: ICD Implant;  Surgeon: Deboraha Sprang, MD;  Location: [facility];  Service: Cardiovascular;  Laterality: N/A;  . WRIST SURGERY      Prior to Admission medications   Medication Sig Start Date End Date Taking? Authorizing Provider  apixaban (ELIQUIS) 5 MG TABS tablet Take 1 tablet (5 mg total) by mouth 2 (two) times daily. 09/06/19  Yes Larey Dresser, MD  atorvastatin (LIPITOR) 80 MG tablet TAKE 1 TABLET(80 MG) BY MOUTH DAILY 10/01/19  Yes Dorena Dew, FNP  carvedilol (COREG) 12.5 MG tablet Take 1.5 tablets (18.75 mg total) by mouth 2 (two) times daily. 08/17/18  Yes Larey Dresser, MD  citalopram (CELEXA) 40 MG tablet Take 40 mg by mouth daily.   Yes [provider]  digoxin (LANOXIN) 0.125 MG tablet Take 1 tablet (0.125 mg  total) by mouth daily. 08/27/18  Yes Georgiana Shore, NP  fenofibrate 54 MG tablet Take 1 tablet (54 mg total) by mouth daily. 12/11/19 03/10/20 Yes Larey Dresser, MD  furosemide (LASIX) 40 MG tablet Take 80 mg (2 tablet) every morning and 40 mg (1 tablet) every evening 10/07/19  Yes Larey Dresser, MD  icosapent Ethyl (VASCEPA) 1 g capsule Take 2 capsules (2 g total) by mouth 2 (two) times daily. 09/11/19  Yes Larey Dresser, MD  Insulin Glargine (LANTUS SOLOSTAR) 100 UNIT/ML Solostar Pen Inject 40 Units into the skin daily at 10 pm. Patient taking differently: Inject 35 Units into the skin daily at 10 pm.  07/13/18  Yes Lanae Boast, FNP  INVOKANA 300 MG TABS tablet TAKE 1 TABLET(300 MG) BY MOUTH DAILY BEFORE BREAKFAST 10/02/19  Yes Vevelyn Francois, NP  isosorbide mononitrate (IMDUR) 30 MG 24 hr tablet Take 1 tablet (30 mg total) by mouth daily. 09/06/19 02/01/20 Yes Larey Dresser, MD  metFORMIN (GLUCOPHAGE) 1000 MG tablet TAKE 1 TABLET(1000 MG) BY MOUTH TWICE DAILY WITH A MEAL 10/01/19  Yes Dorena Dew, FNP  sacubitril-valsartan (ENTRESTO) 24-26 MG Take 1 tablet by mouth 2 (two) times daily. 09/06/19  Yes Larey Dresser, MD  topiramate (TOPAMAX) 50 MG tablet TAKE 1 TABLET BY MOUTH TWICE DAILY AS NEEDED FOR HEADACHE 10/31/19  Yes [name], FNP  traZODone (DESYREL) 100 MG tablet Take 1 tablet (100 mg total)  by mouth at bedtime. 08/13/19  Yes [name], FNP  Vitamin D, Ergocalciferol, (DRISDOL) 1.25 MG (50000 UNIT) CAPS capsule Take 1 capsule (50,000 Units total) by mouth every 7 (seven) days. 08/19/19  Yes [name], FNP    Current Facility-Administered Medications  Medication Dose Route Frequency Provider Last Rate Last Admin  . 0.9 %  sodium chloride infusion   Intravenous Continuous Rise Patience, MD   Stopped at 01/31/20 2333  . amiodarone (NEXTERONE PREMIX) 360-4.14 MG/200ML-% (1.8 mg/mL) IV infusion  60 mg/hr Intravenous Continuous Rise Patience, MD  33.3 mL/hr at 02/01/20 0328 60 mg/hr at 02/01/20 0328   Followed by  . amiodarone (NEXTERONE PREMIX) 360-4.14 MG/200ML-% (1.8 mg/mL) IV infusion  30 mg/hr Intravenous Continuous Rise Patience, MD      . dextrose 5 % in lactated ringers infusion   Intravenous Continuous Rise Patience, MD 75 mL/hr at 02/01/20 0615 New Bag at 02/01/20 0615  . dextrose 50 % solution 0-50 mL  0-50 mL Intravenous PRN Rise Patience, MD      . famotidine (PEPCID) IVPB 20 mg premix  20 mg Intravenous Q12H Rise Patience, MD   Stopped at 02/01/20 [phone]  . insulin regular, human (MYXREDLIN) 100 units/ 100 mL infusion   Intravenous Continuous Rise Patience, MD 7 mL/hr at 02/01/20 0834 7 Units/hr at 02/01/20 0834  . lactated ringers infusion   Intravenous Continuous Rise Patience, MD   Held at 02/01/20 [phone]  . ondansetron (ZOFRAN) injection 4 mg  4 mg Intravenous Q6H PRN Sheikh, Georgina Quint Latif, DO      . prochlorperazine (COMPAZINE) injection 10 mg  10 mg Intravenous Q6H PRN Raiford Noble Latif, DO   10 mg at 02/01/20 0745   Current Outpatient Medications  Medication Sig Dispense Refill  . apixaban (ELIQUIS) 5 MG TABS tablet Take 1 tablet (5 mg total) by mouth 2 (two) times daily. 60 tablet 4  . atorvastatin (LIPITOR) 80 MG tablet TAKE 1 TABLET(80 MG) BY MOUTH DAILY 90 tablet 3  . carvedilol (COREG) 12.5 MG tablet Take 1.5 tablets (18.75 mg total) by mouth 2 (two) times daily. 240 tablet 3  . citalopram (CELEXA) 40 MG tablet Take 40 mg by mouth daily.    . digoxin (LANOXIN) 0.125 MG tablet Take 1 tablet (0.125 mg total) by mouth daily. 90 tablet 3  . fenofibrate 54 MG tablet Take 1 tablet (54 mg total) by mouth daily. 30 tablet 2  . furosemide (LASIX) 40 MG tablet Take 80 mg (2 tablet) every morning and 40 mg (1 tablet) every evening 270 tablet 3  . icosapent Ethyl (VASCEPA) 1 g capsule Take 2 capsules (2 g total) by mouth 2 (two) times daily. 120 capsule 5  . Insulin Glargine (LANTUS  SOLOSTAR) 100 UNIT/ML Solostar Pen Inject 40 Units into the skin daily at 10 pm. (Patient taking differently: Inject 35 Units into the skin daily at 10 pm. ) 15 pen 3  . INVOKANA 300 MG TABS tablet TAKE 1 TABLET(300 MG) BY MOUTH DAILY BEFORE BREAKFAST 90 tablet 3  . isosorbide mononitrate (IMDUR) 30 MG 24 hr tablet Take 1 tablet (30 mg total) by mouth daily. 90 tablet 3  . metFORMIN (GLUCOPHAGE) 1000 MG tablet TAKE 1 TABLET(1000 MG) BY MOUTH TWICE DAILY WITH A MEAL 180 tablet 3  . sacubitril-valsartan (ENTRESTO) 24-26 MG Take 1 tablet by mouth 2 (two) times daily. 60 tablet 5  . topiramate (TOPAMAX) 50 MG tablet TAKE 1  TABLET BY MOUTH TWICE DAILY AS NEEDED FOR HEADACHE 60 tablet 2  . traZODone (DESYREL) 100 MG tablet Take 1 tablet (100 mg total) by mouth at bedtime. 30 tablet 3  . Vitamin D, Ergocalciferol, (DRISDOL) 1.25 MG (50000 UNIT) CAPS capsule Take 1 capsule (50,000 Units total) by mouth every 7 (seven) days. 5 capsule 6    Allergies as of 01/31/2020 - Review Complete 01/31/2020  Allergen Reaction Noted  . Lisinopril Cough 01/21/2016    Family History  Problem Relation Age of Onset  . Hypertension Mother   . Healthy Father   . Stroke Father     Social History   Socioeconomic History  . Marital status: Legally Separated    Spouse name: Not on file  . Number of children: Not on file  . Years of education: Not on file  . Highest education level: Not on file  Occupational History  . Not on file  Tobacco Use  . Smoking status: Never Smoker  . Smokeless tobacco: Never Used  Vaping Use  . Vaping Use: Never used  Substance and Sexual Activity  . Alcohol use: Yes    Alcohol/week: 1.0 - 2.0 standard drink    Types: 1 - 2 Glasses of wine per week    Comment: rare  . Drug use: No  . Sexual activity: Yes  Other Topics Concern  . Not on file  Social History Narrative  . Not on file   Social Determinants of Health   Financial Resource Strain:   . Difficulty of Paying  Living Expenses: Not on file  Food Insecurity:   . Worried About Charity fundraiser in the Last Year: Not on file  . Ran Out of Food in the Last Year: Not on file  Transportation Needs:   . Lack of Transportation (Medical): Not on file  . Lack of Transportation (Non-Medical): Not on file  Physical Activity:   . Days of Exercise per Week: Not on file  . Minutes of Exercise per Session: Not on file  Stress:   . Feeling of Stress : Not on file  Social Connections:   . Frequency of Communication with Friends and Family: Not on file  . Frequency of Social Gatherings with Friends and Family: Not on file  . Attends Religious Services: Not on file  . Active Member of Clubs or Organizations: Not on file  . Attends Archivist Meetings: Not on file  . Marital Status: Not on file  Intimate Partner Violence:   . Fear of Current or Ex-Partner: Not on file  . Emotionally Abused: Not on file  . Physically Abused: Not on file  . Sexually Abused: Not on file    Review of Systems: As per HPI, all others negative  Physical Exam: Vital signs in last 24 hours: Temp:  [98.3 F (36.8 C)-98.9 F (37.2 C)] 98.9 F (37.2 C) (10/23 0701) Pulse Rate:  [41-165] 155 (10/23 0840) Resp:  [13-30] 15 (10/23 0840) BP: (83-217)/(63-173) 138/121 (10/23 0840) SpO2:  [92 %-100 %] 96 % (10/23 0840) Weight:  [99.8 kg] 99.8 kg (10/22 2158)   General:   Alert,  Somnolent, falls asleep quickly Head:  Normocephalic and atraumatic. Eyes:  Sclera clear, no icterus.   Some bilateral orbital edema Ears:  Normal auditory acuity. Nose:  No deformity, discharge,  or lesions. Mouth:  No deformity or lesions.  Oropharynx pink & moist. Neck:  Supple; no masses or thyromegaly. Heart:  Tachycardic, irregular Abdomen:  Soft, nontender  and nondistended. No masses, hepatosplenomegaly or hernias noted. Normal bowel sounds, without guarding, and without rebound.     Msk:  Symmetrical without gross deformities. Normal  posture. Pulses:  Normal pulses noted. Extremities:  Without clubbing or edema. Neurologic:  Alert, but quickly somnolent without stimulation Skin:  Intact without significant lesions or rashes. Cervical Nodes:  No significant cervical adenopathy Psych:  Alert and cooperative. Normal mood and affect.   Lab Results: Recent Labs    01/31/20 2217 02/01/20 0520 02/01/20 0800  WBC 4.9 7.0 6.3  HGB 13.2 12.4* 11.9*  HCT 41.8 38.9* 38.0*  PLT 270 283 239   BMET Recent Labs    01/31/20 2317 02/01/20 0520  NA 138 141  K 5.6* 4.0  CL 94* 98  CO2 21* 22  GLUCOSE 381* 240*  BUN 25* 31*  CREATININE 1.86* 2.31*  CALCIUM 9.0 9.2   LFT Recent Labs    01/31/20 2317  PROT 7.5  ALBUMIN 3.5  AST 39  ALT 24  ALKPHOS 59  BILITOT 2.1*   PT/INR No results for input(s): LABPROT, INR in the last 72 hours.  Studies/Results: CT Abdomen Pelvis Wo Contrast  Result Date: 02/01/2020 CLINICAL DATA:  Abdominal pain and vomiting EXAM: CT ABDOMEN AND PELVIS WITHOUT CONTRAST TECHNIQUE: Multidetector CT imaging of the abdomen and pelvis was performed following the standard protocol without IV contrast. COMPARISON:  Aug 19, 2019 FINDINGS: Lower chest: There is mild cardiomegaly present. No hiatal hernia. The visualized portions of the lungs are clear. Hepatobiliary: Although limited due to the lack of intravenous contrast, normal in appearance without gross focal abnormality. No evidence of calcified gallstones or biliary ductal dilatation. Pancreas:  Unremarkable.  No surrounding inflammatory changes. Spleen: Normal in size. Although limited due to the lack of intravenous contrast, normal in appearance. Adrenals/Urinary Tract: Both adrenal glands appear normal. The kidneys and collecting system appear normal without evidence of urinary tract calculus or hydronephrosis. Bladder is unremarkable. Stomach/Bowel: The stomach, small bowel, and colon are normal in appearance. No inflammatory changes or  obstructive findings. A moderate amount of colonic stool is present. Appendix is normal. Vascular/Lymphatic: There are no enlarged abdominal or pelvic lymph nodes. Scattered mild aortic atherosclerotic calcifications are seen without aneurysmal dilatation. Reproductive: The prostate is unremarkable. Other: No evidence of abdominal wall mass or hernia. Musculoskeletal: No acute or significant osseous findings. IMPRESSION: No acute intra-abdominal or pelvic pathology to explain the patient's symptoms. Moderate amount of colonic stool Aortic Atherosclerosis (ICD10-I70.0). Electronically Signed   By: Prudencio Pair M.D.   On: 02/01/2020 02:37   DG ABD ACUTE 2+V W 1V CHEST  Result Date: 01/31/2020 CLINICAL DATA:  Abdominal pain and vomiting starting today. EXAM: DG ABDOMEN ACUTE WITH 1 VIEW CHEST COMPARISON:  Chest 08/18/2019.  CT abdomen and pelvis 08/19/2019 FINDINGS: Cardiac pacemaker. Shallow inspiration. Heart size and pulmonary vascularity are normal for technique. Lungs are clear. No pleural effusions. No pneumothorax. Scattered gas and stool throughout the colon. No small or large bowel distention. No free intra-abdominal air. No abnormal air-fluid levels. No radiopaque stones. Calcified phleboliths in the pelvis. Visualized bones and soft tissue contours are normal. IMPRESSION: No evidence of active pulmonary disease. Nonobstructive bowel gas pattern. Electronically Signed   By: Lucienne Capers M.D.   On: 01/31/2020 23:52    Impression:  1.  Hematemesis.  None since admission. 2.  Afib with RVR, some non-sustained VT. 3.  Chronic anticoagulation, Eliquis, history PE. 4.  NICM s/p AICD. 5.  Hearing impaired.  Plan:  1.  Patient can provide little history for me, he is somnolent and keeps falling asleep. 2.  Cardiac situation is most pressing matter; cardiology team is in process of evaluating patient. 3.  I would not pursue endoscopy today unless he has life-threatening bleeding, given his  improvement of bleeding, current cardiac instability and recent intake of Eliquis.  And should patient require endoscopy today, he would have to be intubated and procedure done in ICU setting. 4.  Hold anticoagulation. 5.  Volume replete. 6.  PPI. 7.  Would ideally medically optimize patient and then perform EGD once more stable in the next 1-2 days. 8.  Case discussed with Dr. Harrell Gave (cardiology). 9.  [facility] will follow.   LOS: 0 days   Rusty Villella M  02/01/2020, 8:55 AM  Cell [phone] If no answer or after 5 PM call [phone]

## 2020-02-01 NOTE — ED Notes (Signed)
Per Dr. Harrell Gave keep amiodarone drip infusion at this time.

## 2020-02-01 NOTE — ED Notes (Signed)
MD at bedside, instructed to stop the fluids and to stop insulin gtt

## 2020-02-01 NOTE — Consult Note (Signed)
Cardiology Consultation:   Patient ID: Troy Rush MRN: [id number]; DOB: Aug 17, 1970  Admit date: 01/31/2020 Date of Consult: 02/01/2020  Primary Care Provider: [name], [facility] Cardiologist: Dr. [facility] Electrophysiologist:  Dr. Caryl Comes   Patient Profile:   Troy Rush is a 49 y.o. male with a hx of deafness, hypertension, type II diabetes, chronic systolic and diastolic heart failure, nonischemic cardiomyopathy s/p boston scientific ICD who is being seen today for the evaluation of atrial fibrillation with rapid ventricular response at the request of Dr. Hal Hope and Dr. Tamera Punt.  History of Present Illness:   Mr. Brimage presented to the ER 01/31/20 with abdominal pain and emesis for one day. Labs were concerning for given elevated sugar, elevated lactate and anion gap, but pH normal. While in the ER, he went into atrial fibrillation with RVR. Cardiology consulted for further management. He was started on IV amiodarone in the interim.   My review of labs in ER: TSH nl, CBC within his normal range (slightly anemic), white count within normal range (though slightly above his recent). Lactate 2.4. hsTnI 79. BHB very elevated at 4.82. Cr elevated at 1.86 -> 2.31, BUN 25 -> 31, K 5.6 -> 4.0, glucose 381 -> 240, anion gap 23 -> 21. UA showed >500 glucose, moderate Hgb, 20 ketones, >300 protein. VBG with pH 7.37. Digoxin level <0.2. Lipase elevated slightly at 73  Imaging of abdomen did not show any acute processes. ECG at 0001 this AM showed NSR at 91 bpm. ECG at 8:19 AM showed atrial fibrillation with RVR and nonspecific ST changes at 170 bpm.  History attempted with ASL interpreter via video, Hinton Dyer, [phone]. Interview is difficult as patient kept falling asleep, could only minimally answer questions. We discussed his atrial fibrillation, and he asked what might have caused this. He notes that he has not taken his medication in about 2 days. No chest pain, no  shortness of breath. Otherwise drifts to sleep and does not answer questions.  Past Medical History:  Diagnosis Date  . AICD (automatic cardioverter/defibrillator) present 10/14/2016  . Bilateral deafness   . Bronchitis   . CHF (congestive heart failure) ([place])   . Depression   . Diabetes mellitus without complication ([place])   . DVT, lower extremity ([place]) 07/12/2019  . Dyslipidemia   . Headache 08/2019  . Hypertension   . Insomnia 08/2019  . NICM (nonischemic cardiomyopathy) ([place]) 09/15/2016  . Pancreatitis   . Vitamin D deficiency 08/2019    Past Surgical History:  Procedure Laterality Date  . BACK SURGERY    . CARDIAC CATHETERIZATION    . ICD IMPLANT  10/14/2016  . ICD IMPLANT N/A 10/14/2016   Procedure: ICD Implant;  Surgeon: Deboraha Sprang, MD;  Location: [facility];  Service: Cardiovascular;  Laterality: N/A;  . WRIST SURGERY       Home Medications:  Prior to Admission medications   Medication Sig Start Date End Date Taking? Authorizing Provider  apixaban (ELIQUIS) 5 MG TABS tablet Take 1 tablet (5 mg total) by mouth 2 (two) times daily. 09/06/19  Yes Larey Dresser, MD  atorvastatin (LIPITOR) 80 MG tablet TAKE 1 TABLET(80 MG) BY MOUTH DAILY 10/01/19  Yes Dorena Dew, FNP  carvedilol (COREG) 12.5 MG tablet Take 1.5 tablets (18.75 mg total) by mouth 2 (two) times daily. 08/17/18  Yes Larey Dresser, MD  citalopram (CELEXA) 40 MG tablet Take 40 mg by mouth daily.   Yes [provider]  digoxin (LANOXIN) 0.125 MG tablet Take 1 tablet (0.125 mg total) by mouth daily. 08/27/18  Yes Georgiana Shore, NP  fenofibrate 54 MG tablet Take 1 tablet (54 mg total) by mouth daily. 12/11/19 03/10/20 Yes Larey Dresser, MD  furosemide (LASIX) 40 MG tablet Take 80 mg (2 tablet) every morning and 40 mg (1 tablet) every evening 10/07/19  Yes Larey Dresser, MD  icosapent Ethyl (VASCEPA) 1 g capsule Take 2 capsules (2 g total) by mouth 2 (two) times daily. 09/11/19  Yes Larey Dresser, MD  Insulin Glargine (LANTUS SOLOSTAR) 100 UNIT/ML Solostar Pen Inject 40 Units into the skin daily at 10 pm. Patient taking differently: Inject 35 Units into the skin daily at 10 pm.  07/13/18  Yes Lanae Boast, FNP  INVOKANA 300 MG TABS tablet TAKE 1 TABLET(300 MG) BY MOUTH DAILY BEFORE BREAKFAST 10/02/19  Yes Vevelyn Francois, NP  isosorbide mononitrate (IMDUR) 30 MG 24 hr tablet Take 1 tablet (30 mg total) by mouth daily. 09/06/19 02/01/20 Yes Larey Dresser, MD  metFORMIN (GLUCOPHAGE) 1000 MG tablet TAKE 1 TABLET(1000 MG) BY MOUTH TWICE DAILY WITH A MEAL 10/01/19  Yes Dorena Dew, FNP  sacubitril-valsartan (ENTRESTO) 24-26 MG Take 1 tablet by mouth 2 (two) times daily. 09/06/19  Yes Larey Dresser, MD  topiramate (TOPAMAX) 50 MG tablet TAKE 1 TABLET BY MOUTH TWICE DAILY AS NEEDED FOR HEADACHE 10/31/19  Yes [name], FNP  traZODone (DESYREL) 100 MG tablet Take 1 tablet (100 mg total) by mouth at bedtime. 08/13/19  Yes [name], FNP  Vitamin D, Ergocalciferol, (DRISDOL) 1.25 MG (50000 UNIT) CAPS capsule Take 1 capsule (50,000 Units total) by mouth every 7 (seven) days. 08/19/19  Yes [name], FNP    Inpatient Medications: Scheduled Meds: . metoprolol tartrate  12.5 mg Oral BID   Continuous Infusions: . sodium chloride Stopped (01/31/20 2333)  . amiodarone 30 mg/hr (02/01/20 0901)  . dextrose 5% lactated ringers 75 mL/hr at 02/01/20 0615  . famotidine (PEPCID) IV Stopped (02/01/20 9450)  . insulin 7 Units/hr (02/01/20 0834)  . lactated ringers Stopped (02/01/20 0729)   PRN Meds: dextrose, ondansetron (ZOFRAN) IV, prochlorperazine  Allergies:    Allergies  Allergen Reactions  . Lisinopril Cough    Social History:   Social History   Socioeconomic History  . Marital status: Legally Separated    Spouse name: Not on file  . Number of children: Not on file  . Years of education: Not on file  . Highest education level: Not on file    Occupational History  . Not on file  Tobacco Use  . Smoking status: Never Smoker  . Smokeless tobacco: Never Used  Vaping Use  . Vaping Use: Never used  Substance and Sexual Activity  . Alcohol use: Yes    Alcohol/week: 1.0 - 2.0 standard drink    Types: 1 - 2 Glasses of wine per week    Comment: rare  . Drug use: No  . Sexual activity: Yes  Other Topics Concern  . Not on file  Social History Narrative  . Not on file   Social Determinants of Health   Financial Resource Strain:   . Difficulty of Paying Living Expenses: Not on file  Food Insecurity:   . Worried About Charity fundraiser in the Last Year: Not on file  . Ran Out of Food in the Last Year: Not on file  Transportation Needs:   .  Lack of Transportation (Medical): Not on file  . Lack of Transportation (Non-Medical): Not on file  Physical Activity:   . Days of Exercise per Week: Not on file  . Minutes of Exercise per Session: Not on file  Stress:   . Feeling of Stress : Not on file  Social Connections:   . Frequency of Communication with Friends and Family: Not on file  . Frequency of Social Gatherings with Friends and Family: Not on file  . Attends Religious Services: Not on file  . Active Member of Clubs or Organizations: Not on file  . Attends Archivist Meetings: Not on file  . Marital Status: Not on file  Intimate Partner Violence:   . Fear of Current or Ex-Partner: Not on file  . Emotionally Abused: Not on file  . Physically Abused: Not on file  . Sexually Abused: Not on file    Family History:    Family History  Problem Relation Age of Onset  . Hypertension Mother   . Healthy Father   . Stroke Father      ROS:  Please see the history of present illness.  Unable to otherwise assess review of systems as he keeps drifting back to sleep.  Physical Exam/Data:   Vitals:   02/01/20 0835 02/01/20 0840 02/01/20 0850 02/01/20 0900  BP: (!) 140/129 (!) 138/121 101/78 114/77  Pulse: (!)  160 (!) 155 80 (!) 59  Resp: 14 15 18 15   Temp:      TempSrc:      SpO2: 98% 96% 98% 99%  Weight:      Height:        Intake/Output Summary (Last 24 hours) at 02/01/2020 0913 Last data filed at 02/01/2020 0300 Gross per 24 hour  Intake 500.23 ml  Output --  Net 500.23 ml   Last 3 Weights 01/31/2020 12/17/2019 10/07/2019  Weight (lbs) 220 lb 228 lb 3.2 oz 217 lb 6.4 oz  Weight (kg) 99.791 kg 103.511 kg 98.612 kg     Body mass index is 31.57 kg/m.  General:  Lethargic, minimally interactive and only for brief periods, but in no acute distress HEENT: normal Neck: no JVD Vascular: Radial pulses 2+ bilaterally Cardiac:  Irregularly irregular and tachycardix S2 and S2, no murmur appreciated Lungs:  clear to auscultation bilaterally, no wheezing, rhonchi or rales, Takes shallow breaths unless instructed to breath deeply Abd: mildly tender to palpation, not firm. Ext: no edema Musculoskeletal:  Moves all 4 limbs independently Skin: warm and dry  Neuro:  Deaf, sign language interpreter used Psych:  lethargic  EKG:  The EKG was personally reviewed and demonstrates:  See above, initial ECG NSR at 91 and then afib RVR at 170 bpm. Telemetry:  Telemetry was personally reviewed and demonstrates:  Sinus until 2 AM, went into rapid afib with RVR. Peak rates in the 170s, has slowly trended down. Brief, intermittent NSVT  Relevant CV Studies: Echo 01/04/2019 1. Left ventricular ejection fraction, by visual estimation, is 30 to  35%. The left ventricle has moderately decreased function. Mildly  increased left ventricular size. There is mildly increased left  ventricular hypertrophy. Diffuse hypokinesis.  2. Left ventricular diastolic Doppler parameters are consistent with  pseudonormalization pattern of LV diastolic filling.  3. The mitral valve is normal in structure. Mild mitral valve  regurgitation. No evidence of mitral stenosis.  4. The tricuspid valve is normal in structure.  Tricuspid valve  regurgitation is trivial.  5. The aortic valve is tricuspid  Aortic valve regurgitation is trivial by  color flow Doppler. Structurally normal aortic valve, with no evidence of  sclerosis or stenosis.  6. There is mild dilatation of the ascending aorta measuring 40 mm.  7. Right atrial size was normal.  8. Left atrial size was mild-moderately dilated.  9. Global right ventricle has normal systolic function.The right  ventricular size is normal. No increase in right ventricular wall  thickness.  10. The inferior vena cava is normal in size with greater than 50%  respiratory variability, suggesting right atrial pressure of 3 mmHg.  11. The tricuspid regurgitant velocity is 3.61 m/s, and with an assumed  right atrial pressure of 3 mmHg, the estimated right ventricular systolic  pressure is moderately elevated at 55.1 mmHg.   Laboratory Data:  High Sensitivity Troponin:   Recent Labs  Lab 02/01/20 0520  TROPONINIHS 79*     Chemistry Recent Labs  Lab 01/31/20 2317 02/01/20 0520 02/01/20 0800  NA 138 141 139  K 5.6* 4.0 3.8  CL 94* 98 99  CO2 21* 22 23  GLUCOSE 381* 240* 222*  BUN 25* 31* 31*  CREATININE 1.86* 2.31* 2.59*  CALCIUM 9.0 9.2 8.9  GFRNONAA 44* 34* 29*  ANIONGAP 23* 21* 17*    Recent Labs  Lab 01/31/20 2317  PROT 7.5  ALBUMIN 3.5  AST 39  ALT 24  ALKPHOS 59  BILITOT 2.1*   Hematology Recent Labs  Lab 01/31/20 2217 02/01/20 0520 02/01/20 0800  WBC 4.9 7.0 6.3  RBC 4.46 4.17* 4.02*  HGB 13.2 12.4* 11.9*  HCT 41.8 38.9* 38.0*  MCV 93.7 93.3 94.5  MCH 29.6 29.7 29.6  MCHC 31.6 31.9 31.3  RDW 12.6 12.9 12.9  PLT 270 283 239   BNPNo results for input(s): BNP, PROBNP in the last 168 hours.  DDimer No results for input(s): DDIMER in the last 168 hours.   Radiology/Studies:  CT Abdomen Pelvis Wo Contrast  Result Date: 02/01/2020 CLINICAL DATA:  Abdominal pain and vomiting EXAM: CT ABDOMEN AND PELVIS WITHOUT CONTRAST  TECHNIQUE: Multidetector CT imaging of the abdomen and pelvis was performed following the standard protocol without IV contrast. COMPARISON:  Aug 19, 2019 FINDINGS: Lower chest: There is mild cardiomegaly present. No hiatal hernia. The visualized portions of the lungs are clear. Hepatobiliary: Although limited due to the lack of intravenous contrast, normal in appearance without gross focal abnormality. No evidence of calcified gallstones or biliary ductal dilatation. Pancreas:  Unremarkable.  No surrounding inflammatory changes. Spleen: Normal in size. Although limited due to the lack of intravenous contrast, normal in appearance. Adrenals/Urinary Tract: Both adrenal glands appear normal. The kidneys and collecting system appear normal without evidence of urinary tract calculus or hydronephrosis. Bladder is unremarkable. Stomach/Bowel: The stomach, small bowel, and colon are normal in appearance. No inflammatory changes or obstructive findings. A moderate amount of colonic stool is present. Appendix is normal. Vascular/Lymphatic: There are no enlarged abdominal or pelvic lymph nodes. Scattered mild aortic atherosclerotic calcifications are seen without aneurysmal dilatation. Reproductive: The prostate is unremarkable. Other: No evidence of abdominal wall mass or hernia. Musculoskeletal: No acute or significant osseous findings. IMPRESSION: No acute intra-abdominal or pelvic pathology to explain the patient's symptoms. Moderate amount of colonic stool Aortic Atherosclerosis (ICD10-I70.0). Electronically Signed   By: Prudencio Pair M.D.   On: 02/01/2020 02:37   DG ABD ACUTE 2+V W 1V CHEST  Result Date: 01/31/2020 CLINICAL DATA:  Abdominal pain and vomiting starting today. EXAM: DG ABDOMEN ACUTE WITH 1  VIEW CHEST COMPARISON:  Chest 08/18/2019.  CT abdomen and pelvis 08/19/2019 FINDINGS: Cardiac pacemaker. Shallow inspiration. Heart size and pulmonary vascularity are normal for technique. Lungs are clear. No pleural  effusions. No pneumothorax. Scattered gas and stool throughout the colon. No small or large bowel distention. No free intra-abdominal air. No abnormal air-fluid levels. No radiopaque stones. Calcified phleboliths in the pelvis. Visualized bones and soft tissue contours are normal. IMPRESSION: No evidence of active pulmonary disease. Nonobstructive bowel gas pattern. Electronically Signed   By: Lucienne Capers M.D.   On: 01/31/2020 23:52     Assessment and Plan:   Atrial fibrillation with RVR: -I cannot see that this has been documented previously. Likely in the setting of ketotic diabetic episode (without acidosis) -CHA2DS2-VASc Score = 3 [CHF History: 1, HTN History: 1, Diabetes History: 1, Stroke History: 0, Vascular Disease History: 0, Age Score: 0, Gender Score: 0].  Therefore, the patient's annual risk of stroke is 3.2 %.    -per Dr. Claris Gladden prior recommendations, he was prescribed apixaban indefinitely for prior unprovoked DVT/PE -he has not taken medications in 2 days. Given concern for possible GI bleed, would hold apixaban in the short term. Once clear, would restart apixaban 5 mg BID  -given reduced EF, we have limited options for atrial fibrillation rhythm control. With prior issues with medication adherence, he is not a good candidate for dofetilide. I would continue IV amiodarone, and once his sugars and labs have normalized, if rates controlled, can then continue loading orally -he is prescribed both digoxin 0.125 mg daily and carvedilol 18.75 mg BID. With his low digoxin level, unclear how frequently he has been taking these medications.  -I was concerned that he has had intermittent low blood pressures, but this appears to be spurious. He is maintaining systolic blood pressures >794 right now. No recent emesis per nursing. Given this, will add low dose oral metoprolol in the short term (12.5 mg tartrate BID) and we can uptitrate as able. IV metoprolol will help in the short term but is  not long lasting. Would hold metoprolol if pressures drop <80 systolic.  -hold carvedilol, digoxin for now -no diltiazem given reduced EF  Chronic systolic and diastolic heart failure Nonischemic cardiomyopathy Chronic kidney disease, previously stage 3a, with acute kidney injury this admission -would hold his heart failure medications at this time given AKI and borderline blood pressures -given his ketosis and elevated Cr, would continue hydration. His weight today is 99.8 kg, and at his prior visit with Dr. Aundra Dubin it was 104.2 kg -hold lasix, imdur, entresto at this time  Hyperlipidemia: -LFTs normal, but given acute illness and nausea/vomiting, would hold fenofibrate, vascepa, and atorvastatin  Type II diabetes: -unclear what triggered this episode -is on glargine insulin, invokana, metformin prior to admission -defer titration to primary team   He is at high risk for decompensation, given multiple acute issues, rapid heart rate, borderline blood pressures. He will need close monitoring. Complex medical decision making given multiple high risk conditions.  For questions or updates, please contact [place] Please consult [email or web address] for contact info under    Signed, Buford Dresser, MD  02/01/2020 9:13 AM

## 2020-02-01 NOTE — Progress Notes (Signed)
Inpatient Diabetes Program Recommendations  AACE/ADA: New Consensus Statement on Inpatient Glycemic Control (2015)  Target Ranges:  Prepandial:   less than 140 mg/dL      Peak postprandial:   less than 180 mg/dL (1-2 hours)      Critically ill patients:  140 - 180 mg/dL   Results for [name], [name] (MRN [id number]) as of 02/01/2020 15:18  Ref. Range 01/31/2020 23:17  Sodium Latest Ref Range: 135 - 145 mmol/L 138  Potassium Latest Ref Range: 3.5 - 5.1 mmol/L 5.6 (H)  Chloride Latest Ref Range: 98 - 111 mmol/L 94 (L)  CO2 Latest Ref Range: 22 - 32 mmol/L 21 (L)  Glucose Latest Ref Range: 70 - 99 mg/dL 381 (H)  BUN Latest Ref Range: 6 - 20 mg/dL 25 (H)  Creatinine Latest Ref Range: 0.61 - 1.24 mg/dL 1.86 (H)  Calcium Latest Ref Range: 8.9 - 10.3 mg/dL 9.0  Anion gap Latest Ref Range: 5 - 15  23 (H)   Results for [name], [name] (MRN [id number]) as of 02/01/2020 15:18  Ref. Range 02/01/2020 09:31 02/01/2020 10:33 02/01/2020 11:33 02/01/2020 12:31 02/01/2020 13:44  Glucose-Capillary Latest Ref Range: 70 - 99 mg/dL 199 (H) 132 (H) 115 (H) 137 (H) 187 (H)    Admit with: A. fib with RVR and nonsustained V. tach, new onset /Chronic systolic and diastolic CHF/ Acute GI bleed with hematemesis/ Diabetic ketoacidosis   History: DM, CHF  Home DM Meds: Lantus 30 units QHS          Invokana 300 mg Daily       Metformin 1000 mg BID  Current Orders: IV Insulin Drip     MD- Note 3pm BMET pending.  Note that CO2 has resolved but Anion Gap still slightly elevated.  This could be due to the fact that pt is taking an SGLT-2 inhibitor med at home.  We have seen some patients take more time for their acidosis to resolve when these SGLT-2 meds are on board from home.  Would leave on the IV Insulin Drip until Anion Gap 12 or less  When pt is ready to transition to SQ Insulin, please make sure pt receives at least 80% of his home dose of Lantus at least 1 hour prior to d/c of the IV Insulin Drip (Lantus 24  units Daily)  May need to stop the Invokana at time of d/c home and have pt follow up with his PCP prior to restarting the Invokana at home     --Will follow patient during hospitalization--  Wyn Quaker RN, MSN, CDE Diabetes Coordinator Inpatient Glycemic Control Team Team Pager: [phone] (8a-5p)

## 2020-02-02 ENCOUNTER — Encounter (HOSPITAL_COMMUNITY)
Admission: EM | Disposition: A | Discharge status: Home / Self Care | Payer: Self-pay | Source: Home / Self Care | Attending: Internal Medicine

## 2020-02-02 ENCOUNTER — Inpatient Hospital Stay (HOSPITAL_COMMUNITY): Payer: Medicare Other | Admitting: Certified Registered"

## 2020-02-02 DIAGNOSIS — N1831 Chronic kidney disease, stage 3a: Secondary | ICD-10-CM | POA: Diagnosis not present

## 2020-02-02 DIAGNOSIS — I248 Other forms of acute ischemic heart disease: Secondary | ICD-10-CM

## 2020-02-02 DIAGNOSIS — E1110 Type 2 diabetes mellitus with ketoacidosis without coma: Secondary | ICD-10-CM | POA: Diagnosis not present

## 2020-02-02 DIAGNOSIS — I480 Paroxysmal atrial fibrillation: Secondary | ICD-10-CM | POA: Diagnosis not present

## 2020-02-02 DIAGNOSIS — I428 Other cardiomyopathies: Secondary | ICD-10-CM | POA: Diagnosis not present

## 2020-02-02 DIAGNOSIS — N179 Acute kidney failure, unspecified: Secondary | ICD-10-CM | POA: Diagnosis not present

## 2020-02-02 HISTORY — PX: ESOPHAGOGASTRODUODENOSCOPY (EGD) WITH PROPOFOL: SHX5813

## 2020-02-02 LAB — CBC WITH DIFFERENTIAL/PLATELET
Abs Immature Granulocytes: 0.03 10*3/uL (ref 0.00–0.07)
Basophils Absolute: 0 10*3/uL (ref 0.0–0.1)
Basophils Relative: 0 %
Eosinophils Absolute: 0 10*3/uL (ref 0.0–0.5)
Eosinophils Relative: 0 %
HCT: 37.7 % — ABNORMAL LOW (ref 39.0–52.0)
Hemoglobin: 11.8 g/dL — ABNORMAL LOW (ref 13.0–17.0)
Immature Granulocytes: 0 %
Lymphocytes Relative: 16 %
Lymphs Abs: 1.2 10*3/uL (ref 0.7–4.0)
MCH: 29.8 pg (ref 26.0–34.0)
MCHC: 31.3 g/dL (ref 30.0–36.0)
MCV: 95.2 fL (ref 80.0–100.0)
Monocytes Absolute: 0.9 10*3/uL (ref 0.1–1.0)
Monocytes Relative: 11 %
Neutro Abs: 5.8 10*3/uL (ref 1.7–7.7)
Neutrophils Relative %: 73 %
Platelets: 190 10*3/uL (ref 150–400)
RBC: 3.96 MIL/uL — ABNORMAL LOW (ref 4.22–5.81)
RDW: 13.1 % (ref 11.5–15.5)
WBC: 8 10*3/uL (ref 4.0–10.5)
nRBC: 0 % (ref 0.0–0.2)

## 2020-02-02 LAB — TROPONIN I (HIGH SENSITIVITY): Troponin I (High Sensitivity): 459 ng/L (ref <18)

## 2020-02-02 LAB — GLUCOSE, CAPILLARY
Glucose-Capillary: 129 mg/dL — ABNORMAL HIGH (ref 70–99)
Glucose-Capillary: 135 mg/dL — ABNORMAL HIGH (ref 70–99)
Glucose-Capillary: 136 mg/dL — ABNORMAL HIGH (ref 70–99)
Glucose-Capillary: 143 mg/dL — ABNORMAL HIGH (ref 70–99)
Glucose-Capillary: 158 mg/dL — ABNORMAL HIGH (ref 70–99)
Glucose-Capillary: 164 mg/dL — ABNORMAL HIGH (ref 70–99)
Glucose-Capillary: 176 mg/dL — ABNORMAL HIGH (ref 70–99)
Glucose-Capillary: 257 mg/dL — ABNORMAL HIGH (ref 70–99)
Glucose-Capillary: 308 mg/dL — ABNORMAL HIGH (ref 70–99)
Glucose-Capillary: 353 mg/dL — ABNORMAL HIGH (ref 70–99)

## 2020-02-02 LAB — COMPREHENSIVE METABOLIC PANEL
ALT: 19 U/L (ref 0–44)
AST: 25 U/L (ref 15–41)
Albumin: 3.1 g/dL — ABNORMAL LOW (ref 3.5–5.0)
Alkaline Phosphatase: 46 U/L (ref 38–126)
Anion gap: 15 (ref 5–15)
BUN: 35 mg/dL — ABNORMAL HIGH (ref 6–20)
CO2: 23 mmol/L (ref 22–32)
Calcium: 8.6 mg/dL — ABNORMAL LOW (ref 8.9–10.3)
Chloride: 97 mmol/L — ABNORMAL LOW (ref 98–111)
Creatinine, Ser: 2.83 mg/dL — ABNORMAL HIGH (ref 0.61–1.24)
GFR, Estimated: 26 mL/min — ABNORMAL LOW (ref >60)
Glucose, Bld: 176 mg/dL — ABNORMAL HIGH (ref 70–99)
Potassium: 3.4 mmol/L — ABNORMAL LOW (ref 3.5–5.1)
Sodium: 135 mmol/L (ref 135–145)
Total Bilirubin: 1 mg/dL (ref 0.3–1.2)
Total Protein: 6.4 g/dL — ABNORMAL LOW (ref 6.5–8.1)

## 2020-02-02 LAB — OSMOLALITY, URINE: Osmolality, Ur: 489 mOsm/kg (ref 300–900)

## 2020-02-02 LAB — SODIUM, URINE, RANDOM: Sodium, Ur: 23 mmol/L

## 2020-02-02 LAB — MAGNESIUM: Magnesium: 1.9 mg/dL (ref 1.7–2.4)

## 2020-02-02 LAB — CREATININE, URINE, RANDOM: Creatinine, Urine: 274.91 mg/dL

## 2020-02-02 LAB — BETA-HYDROXYBUTYRIC ACID: Beta-Hydroxybutyric Acid: 0.73 mmol/L — ABNORMAL HIGH (ref 0.05–0.27)

## 2020-02-02 LAB — PHOSPHORUS: Phosphorus: 4 mg/dL (ref 2.5–4.6)

## 2020-02-02 SURGERY — CANCELLED PROCEDURE
Anesthesia: Monitor Anesthesia Care | Laterality: Left

## 2020-02-02 SURGERY — ESOPHAGOGASTRODUODENOSCOPY (EGD) WITH PROPOFOL
Anesthesia: Monitor Anesthesia Care

## 2020-02-02 MED ORDER — PROPOFOL 10 MG/ML IV BOLUS
INTRAVENOUS | Status: DC | PRN
  Administered 2020-02-02: 70 mg via INTRAVENOUS
  Administered 2020-02-02: 100 ug/kg/min via INTRAVENOUS

## 2020-02-02 MED ORDER — INSULIN GLARGINE 100 UNIT/ML ~~LOC~~ SOLN
24.0000 [IU] | SUBCUTANEOUS | Status: DC
  Administered 2020-02-02 – 2020-02-03 (×3): 24 [IU] via SUBCUTANEOUS
  Filled 2020-02-02 (×3): qty 0.24

## 2020-02-02 MED ORDER — INSULIN ASPART 100 UNIT/ML ~~LOC~~ SOLN
0.0000 [IU] | SUBCUTANEOUS | Status: DC
  Administered 2020-02-02: 3 [IU] via SUBCUTANEOUS
  Administered 2020-02-02: 2 [IU] via SUBCUTANEOUS
  Administered 2020-02-02: 8 [IU] via SUBCUTANEOUS
  Administered 2020-02-02: 11 [IU] via SUBCUTANEOUS
  Administered 2020-02-03: 3 [IU] via SUBCUTANEOUS
  Administered 2020-02-03: 11 [IU] via SUBCUTANEOUS
  Administered 2020-02-03: 3 [IU] via SUBCUTANEOUS
  Administered 2020-02-03: 2 [IU] via SUBCUTANEOUS
  Administered 2020-02-03: 15 [IU] via SUBCUTANEOUS
  Administered 2020-02-03 – 2020-02-04 (×3): 3 [IU] via SUBCUTANEOUS
  Administered 2020-02-04: 2 [IU] via SUBCUTANEOUS

## 2020-02-02 MED ORDER — PROMETHAZINE HCL 25 MG/ML IJ SOLN
12.5000 mg | Freq: Once | INTRAMUSCULAR | Status: AC
  Administered 2020-02-02: 12.5 mg via INTRAVENOUS
  Filled 2020-02-02: qty 1

## 2020-02-02 MED ORDER — PROPOFOL 500 MG/50ML IV EMUL
INTRAVENOUS | Status: AC
  Filled 2020-02-02: qty 50

## 2020-02-02 MED ORDER — CARVEDILOL 12.5 MG PO TABS
12.5000 mg | ORAL_TABLET | Freq: Two times a day (BID) | ORAL | Status: DC
  Administered 2020-02-02 – 2020-02-04 (×4): 12.5 mg via ORAL
  Filled 2020-02-02 (×4): qty 1

## 2020-02-02 MED ORDER — LACTATED RINGERS IV SOLN: INTRAVENOUS | Status: DC | PRN

## 2020-02-02 SURGICAL SUPPLY — 14 items

## 2020-02-02 SURGICAL SUPPLY — 15 items

## 2020-02-02 NOTE — Transfer of Care (Signed)
Immediate Anesthesia Transfer of Care Note  Patient: Troy Rush  Procedure(s) Performed: Procedure(s): ESOPHAGOGASTRODUODENOSCOPY (EGD) WITH PROPOFOL (N/A)  Patient Location: PACU and Endoscopy Unit  Anesthesia Type:MAC  Level of Consciousness: awake, alert  and oriented  Airway & Oxygen Therapy: Patient Spontanous Breathing and Patient connected to nasal cannula oxygen  Post-op Assessment: Report given to RN and Post -op Vital signs reviewed and stable  Post vital signs: Reviewed and stable  Last Vitals:  Vitals:   02/02/20 1025 02/02/20 1228  BP: (!) 151/92 (!) 164/97  Pulse: 80   Resp: 10 13  Temp:    SpO2: 93% 57%    Complications: No apparent anesthesia complications

## 2020-02-02 NOTE — Progress Notes (Signed)
Progress Note  Patient Name: Troy Rush Date of Encounter: 02/02/2020  [facility] Cardiologist: Dr. Aundra Dubin  Subjective   Sleeping comfortably this AM. Awakens easily. Communicated with writing/responding on paper today. Denies chest pain or shortness of breath. No new complaints. Per nursing, emesis only with bile overnight.  Inpatient Medications    Scheduled Meds: . Chlorhexidine Gluconate Cloth  6 each Topical Daily  . insulin aspart  0-15 Units Subcutaneous Q4H  . insulin glargine  24 Units Subcutaneous Q24H  . mouth rinse  15 mL Mouth Rinse BID  . metoprolol tartrate  12.5 mg Oral BID  . pantoprazole (PROTONIX) IV  40 mg Intravenous Q12H  . promethazine  12.5 mg Intravenous Once   Continuous Infusions: . sodium chloride Stopped (01/31/20 2331)  . amiodarone 30 mg/hr (02/02/20 0600)  . dextrose 5% lactated ringers 75 mL/hr at 02/02/20 0600  . famotidine (PEPCID) IV Stopped (02/01/20 2223)   PRN Meds: dextrose, hydrALAZINE, morphine injection, ondansetron (ZOFRAN) IV, prochlorperazine   Vital Signs    Vitals:   02/02/20 0600 02/02/20 0700 02/02/20 0800 02/02/20 0832  BP: (!) 143/84 (!) 169/88 122/90   Pulse: 88 87 91   Resp: 13 13 (!) 23   Temp:    97.9 F (36.6 C)  TempSrc:    Oral  SpO2: 96% 99% 98%   Weight:      Height:        Intake/Output Summary (Last 24 hours) at 02/02/2020 0916 Last data filed at 02/02/2020 0600 Gross per 24 hour  Intake 3427.98 ml  Output 350 ml  Net 3077.98 ml   Last 3 Weights 01/31/2020 12/17/2019 10/07/2019  Weight (lbs) 220 lb 228 lb 3.2 oz 217 lb 6.4 oz  Weight (kg) 99.791 kg 103.511 kg 98.612 kg      Telemetry    Converted to sinus rhythm at approximately 2 PM yesterday. Remains in sinus rhythm - Personally Reviewed  ECG    No new since yesterday - Personally Reviewed  Physical Exam   GEN: No acute distress.   Neck: No JVD Cardiac: RRR, no murmurs, rubs, or gallops.  Respiratory: Clear to auscultation  bilaterally. GI: Soft, nontender, non-distended  MS: No edema; No deformity. Neuro:  Nonfocal  Psych: Normal affect   Labs    High Sensitivity Troponin:   Recent Labs  Lab 02/01/20 0520 02/01/20 0705 02/02/20 0716  TROPONINIHS 79* 393* 459*      Chemistry Recent Labs  Lab 01/31/20 2317 02/01/20 0520 02/01/20 1705 02/01/20 2101 02/02/20 0239  NA 138   < > 137 135 135  K 5.6*   < > 3.9 3.5 3.4*  CL 94*   < > 97* 96* 97*  CO2 21*   < > 25 26 23   GLUCOSE 381*   < > 167* 238* 176*  BUN 25*   < > 34* 36* 35*  CREATININE 1.86*   < > 2.84* 3.01* 2.83*  CALCIUM 9.0   < > 9.0 8.9 8.6*  PROT 7.5  --   --   --  6.4*  ALBUMIN 3.5  --   --   --  3.1*  AST 39  --   --   --  25  ALT 24  --   --   --  19  ALKPHOS 59  --   --   --  46  BILITOT 2.1*  --   --   --  1.0  GFRNONAA 44*   < >  26* 24* 26*  ANIONGAP 23*   < > 15 13 15    < > = values in this interval not displayed.     Hematology Recent Labs  Lab 02/01/20 1305 02/01/20 1705 02/02/20 0239  WBC 9.0 8.7 8.0  RBC 4.01* 4.06* 3.96*  HGB 11.9* 12.0* 11.8*  HCT 38.1* 38.7* 37.7*  MCV 95.0 95.3 95.2  MCH 29.7 29.6 29.8  MCHC 31.2 31.0 31.3  RDW 13.1 12.8 13.1  PLT 256 215 190    BNPNo results for input(s): BNP, PROBNP in the last 168 hours.   DDimer No results for input(s): DDIMER in the last 168 hours.   Radiology    CT Abdomen Pelvis Wo Contrast  Result Date: 02/01/2020 CLINICAL DATA:  Abdominal pain and vomiting EXAM: CT ABDOMEN AND PELVIS WITHOUT CONTRAST TECHNIQUE: Multidetector CT imaging of the abdomen and pelvis was performed following the standard protocol without IV contrast. COMPARISON:  Aug 19, 2019 FINDINGS: Lower chest: There is mild cardiomegaly present. No hiatal hernia. The visualized portions of the lungs are clear. Hepatobiliary: Although limited due to the lack of intravenous contrast, normal in appearance without gross focal abnormality. No evidence of calcified gallstones or biliary ductal  dilatation. Pancreas:  Unremarkable.  No surrounding inflammatory changes. Spleen: Normal in size. Although limited due to the lack of intravenous contrast, normal in appearance. Adrenals/Urinary Tract: Both adrenal glands appear normal. The kidneys and collecting system appear normal without evidence of urinary tract calculus or hydronephrosis. Bladder is unremarkable. Stomach/Bowel: The stomach, small bowel, and colon are normal in appearance. No inflammatory changes or obstructive findings. A moderate amount of colonic stool is present. Appendix is normal. Vascular/Lymphatic: There are no enlarged abdominal or pelvic lymph nodes. Scattered mild aortic atherosclerotic calcifications are seen without aneurysmal dilatation. Reproductive: The prostate is unremarkable. Other: No evidence of abdominal wall mass or hernia. Musculoskeletal: No acute or significant osseous findings. IMPRESSION: No acute intra-abdominal or pelvic pathology to explain the patient's symptoms. Moderate amount of colonic stool Aortic Atherosclerosis (ICD10-I70.0). Electronically Signed   By: Prudencio Pair M.D.   On: 02/01/2020 02:37   US RENAL  Result Date: 02/01/2020 CLINICAL DATA:  48 year old male with acute on chronic kidney injury. EXAM: RENAL / URINARY TRACT ULTRASOUND COMPLETE COMPARISON:  Prior CTs FINDINGS: Right Kidney: Renal measurements: 11.6 x 5.3 x 6.0 cm = volume: 491 mL. Echogenicity within normal limits. No mass or hydronephrosis visualized. Left Kidney: Renal measurements: 12.4 x 8.5 x 8.8 cm = volume: 791 mL. Echogenicity within normal limits. No mass or hydronephrosis visualized. Bladder: Appears normal for degree of bladder distention. Other: None. IMPRESSION: Unremarkable renal ultrasound. Electronically Signed   By: Margarette Canada M.D.   On: 02/01/2020 16:30   DG ABD ACUTE 2+V W 1V CHEST  Result Date: 01/31/2020 CLINICAL DATA:  Abdominal pain and vomiting starting today. EXAM: DG ABDOMEN ACUTE WITH 1 VIEW CHEST  COMPARISON:  Chest 08/18/2019.  CT abdomen and pelvis 08/19/2019 FINDINGS: Cardiac pacemaker. Shallow inspiration. Heart size and pulmonary vascularity are normal for technique. Lungs are clear. No pleural effusions. No pneumothorax. Scattered gas and stool throughout the colon. No small or large bowel distention. No free intra-abdominal air. No abnormal air-fluid levels. No radiopaque stones. Calcified phleboliths in the pelvis. Visualized bones and soft tissue contours are normal. IMPRESSION: No evidence of active pulmonary disease. Nonobstructive bowel gas pattern. Electronically Signed   By: Lucienne Capers M.D.   On: 01/31/2020 23:52    Cardiac Studies   Echo 01/04/2019  1. Left ventricular ejection fraction, by visual estimation, is 30 to  35%. The left ventricle has moderately decreased function. Mildly  increased left ventricular size. There is mildly increased left  ventricular hypertrophy. Diffuse hypokinesis.  2. Left ventricular diastolic Doppler parameters are consistent with  pseudonormalization pattern of LV diastolic filling.  3. The mitral valve is normal in structure. Mild mitral valve  regurgitation. No evidence of mitral stenosis.  4. The tricuspid valve is normal in structure. Tricuspid valve  regurgitation is trivial.  5. The aortic valve is tricuspid Aortic valve regurgitation is trivial by  color flow Doppler. Structurally normal aortic valve, with no evidence of  sclerosis or stenosis.  6. There is mild dilatation of the ascending aorta measuring 40 mm.  7. Right atrial size was normal.  8. Left atrial size was mild-moderately dilated.  9. Global right ventricle has normal systolic function.The right  ventricular size is normal. No increase in right ventricular wall  thickness.  10. The inferior vena cava is normal in size with greater than 50%  respiratory variability, suggesting right atrial pressure of 3 mmHg.  11. The tricuspid regurgitant velocity is  3.61 m/s, and with an assumed  right atrial pressure of 3 mmHg, the estimated right ventricular systolic  pressure is moderately elevated at 55.1 mmHg.  Patient Profile     49 y.o. male with a hx of deafness, hypertension, type II diabetes, chronic systolic and diastolic heart failure, nonischemic cardiomyopathy s/p [employer] scientific ICD who is being seen for the evaluation of atrial fibrillation with rapid ventricular response at the request of Dr. Hal Hope and Dr. Tamera Punt.  Assessment & Plan    Atrial fibrillation with RVR: -I cannot see that this has been documented previously. Likely in the setting of ketotic diabetic episode (without acidosis) -CHA2DS2-VASc Score = 3 [CHF History: 1, HTN History: 1, Diabetes History: 1, Stroke History: 0, Vascular Disease History: 0, Age Score: 0, Gender Score: 0].  Therefore, the patient's annual risk of stroke is 3.2 %.    -per Dr. Claris Gladden prior recommendations, he was prescribed apixaban indefinitely for prior unprovoked DVT/PE. Would restart anticoagulation when cleared from GI standpoint. -planned EGD today. Anticoagulation recommendations will be clearer post procedure. Hgb has been stable, but with reported hematemesis need to be cautious -he spontaneously converted around 2 PM 10/23. Once tolerating oral meds, would continue amiodarone orally -he is prescribed both digoxin 0.125 mg daily and carvedilol 18.75 mg BID. With his low digoxin level, unclear how frequently he has been taking these medications.  -using low dose metoprolol currently. Will change to carvedilol this evening when he is able to eat. Will start at 12.5 mg BID, can uptitrate as needed. His blood pressures are much improved and have been high recently. -can use hydralazine/imdur as well if needed -hold digoxin for now given AKI -no diltiazem given reduced EF -TSH normal  Chronic systolic and diastolic heart failure Nonischemic cardiomyopathy Chronic kidney disease, previously  stage 3a, with acute kidney injury this admission -blood pressures improved with hydration -Cr peaked at 3.01, now downtrending to 2.83 -weight on admission 99.8 kg, and at his prior visit with Dr. Aundra Dubin it was 104.2 kg -no output charted in the ER, no weight yet today -would use strict I/O, daily standing weights -hold lasix, entresto at this time  Elevated troponin: -From 79 -> 393 -> 459 -in the setting of acute illness and atrial fibrillation with RVR, suspect demand ischemia -denies chest pain  Hyperlipidemia: -LFTs normal, but  given acute illness and nausea/vomiting, would hold fenofibrate, vascepa, and atorvastatin. Ideally restart tomorrow.  Type II diabetes: -unclear what triggered this episode -is on glargine insulin, invokana, metformin prior to admission -defer titration to primary team   Complex situation, with multiple comorbidities. Balancing renal function, possible GI bleed with diabetic ketosis and history of heart failure.  For questions or updates, please contact [place] Please consult [email or web address] for contact info under   Signed, Buford Dresser, MD  02/02/2020, 9:16 AM

## 2020-02-02 NOTE — Progress Notes (Addendum)
0100: [facility] notified that last 3 glucose checks were below 300. Transition orders were received. 24 units lantus given at 0100, insulin drip off at 0300, and 3 units SSI given for glucose of 154. Glucose checks now q4 hours.

## 2020-02-02 NOTE — Anesthesia Preprocedure Evaluation (Addendum)
Anesthesia Evaluation  Patient identified by MRN, date of birth, ID band Patient awake    Reviewed: Allergy & Precautions, H&P , NPO status , Patient's Chart, lab work & pertinent test results  Airway Mallampati: II  TM Distance: >3 FB Neck ROM: Full    Dental  (+) Dental Advisory Given, Teeth Intact   Pulmonary neg pulmonary ROS,    Pulmonary exam normal breath sounds clear to auscultation       Cardiovascular hypertension, +CHF  Normal cardiovascular exam+ Cardiac Defibrillator  Rhythm:Regular Rate:Normal  Echo 12/2018 1. Left ventricular ejection fraction, by visual estimation, is 30 to 35%. The left ventricle has moderately decreased function. Mildly increased left ventricular size. There is mildly increased left ventricular hypertrophy. Diffuse hypokinesis.  2. Left ventricular diastolic Doppler parameters are consistent with  pseudonormalization pattern of LV diastolic filling.  3. The mitral valve is normal in structure. Mild mitral valve  regurgitation. No evidence of mitral stenosis.  4. The tricuspid valve is normal in structure. Tricuspid valve regurgitation is trivial.  5. The aortic valve is tricuspid Aortic valve regurgitation is trivial by color flow Doppler. Structurally normal aortic valve, with no evidence of sclerosis or stenosis.  6. There is mild dilatation of the ascending aorta measuring 40 mm.  7. Right atrial size was normal.  8. Left atrial size was mild-moderately dilated.  9. Global right ventricle has normal systolic function.The right ventricular size is normal. No increase in right ventricular wall thickness.  10. The inferior vena cava is normal in size with greater than 50% respiratory variability, suggesting right atrial pressure of 3 mmHg.  11. The tricuspid regurgitant velocity is 3.61 m/s, and with an assumed right atrial pressure of 3 mmHg, the estimated right ventricular systolic pressure  is moderately elevated at 55.1 mmHg.   AICD interrogation 12/2019 13 year longevity, 0% paced   Neuro/Psych  Headaches, PSYCHIATRIC DISORDERS Depression    GI/Hepatic   Endo/Other  diabetes, Type 2  Renal/GU      Musculoskeletal   Abdominal   Peds  Hematology   Anesthesia Other Findings   Reproductive/Obstetrics                           Anesthesia Physical  Anesthesia Plan  ASA: IV  Anesthesia Plan: MAC   Post-op Pain Management:    Induction: Intravenous  PONV Risk Score and Plan: 1 and Ondansetron, Propofol infusion, Treatment may vary due to age or medical condition and Midazolam  Airway Management Planned: Simple Face Mask  Additional Equipment: None  Intra-op Plan:   Post-operative Plan:   Informed Consent: I have reviewed the patients History and Physical, chart, labs and discussed the procedure including the risks, benefits and alternatives for the proposed anesthesia with the patient or authorized representative who has indicated his/her understanding and acceptance.     Dental advisory given  Plan Discussed with: CRNA  Anesthesia Plan Comments:        Anesthesia Quick Evaluation

## 2020-02-02 NOTE — Op Note (Signed)
[facility] Patient Name: Troy Rush Procedure Date: 02/02/2020 MRN: [id number] Attending MD: Arta Silence , MD Date of Birth: [birth year] CSN: [id number] Age: 49 Admit Type: Inpatient Procedure:                Upper GI endoscopy Indications:              Epigastric abdominal pain, Hematemesis, Melena Providers:                Arta Silence, MD, Benetta Spar RN, RN, Laverda Sorenson, Technician, Eliberto Ivory, CRNA Referring MD:             PCCM Medicines:                Monitored Anesthesia Care Complications:            No immediate complications. Estimated Blood Loss:     Estimated blood loss: none. Procedure:                Pre-Anesthesia Assessment:                           - Prior to the procedure, a History and Physical                            was performed, and patient medications and                            allergies were reviewed. The patient's tolerance of                            previous anesthesia was also reviewed. The risks                            and benefits of the procedure and the sedation                            options and risks were discussed with the patient.                            All questions were answered, and informed consent                            was obtained. Prior Anticoagulants: The patient has                            taken Eliquis (apixaban), last dose was 2 days                            prior to procedure. ASA Grade Assessment: IV - A                            patient with severe systemic disease that is a  constant threat to life. After reviewing the risks                            and benefits, the patient was deemed in                            satisfactory condition to undergo the procedure.                           After obtaining informed consent, the endoscope was                            passed under direct vision. Throughout the                             procedure, the patient's blood pressure, pulse, and                            oxygen saturations were monitored continuously. The                            [id number]-H190 ([id number]) Olympus gastroscope was                            introduced through the mouth, and advanced to the                            second part of duodenum. The upper GI endoscopy was                            accomplished without difficulty. The patient                            tolerated the procedure well. Scope In: Scope Out: Findings:      LA Grade B (one or more mucosal breaks greater than 5 mm, not extending       between the tops of two mucosal folds) esophagitis was found.      The exam of the esophagus was otherwise normal.      Patchy moderate inflammation was found in the cardia, in the gastric       fundus and in the gastric body.      The exam of the stomach was otherwise normal.      The duodenal bulb, first portion of the duodenum and second portion of       the duodenum were normal.      No old or fresh blood was seen to the extent of our examination. Impression:               - LA Grade B esophagitis.                           - Gastritis.                           - Normal duodenal bulb, first portion of the  duodenum and second portion of the duodenum.                           - Bleeding likely from esophagitis and gastritis,                            but no endoscopic findings to suggest high risk for                            rebleeding. Moderate Sedation:      None Recommendation:           - Return patient to ICU for ongoing care.                           - Soft diet today.                           - Continue present medications.                           - IV PPI BID x 24 hours, then transition to                            pantoprazole 40 mg po bid x 6 weeks, then                            pantoprazole 40 mg po qd thereafter  indefinitely.                           - Hold Eliquis for another couple days, OK to                            resume Tuesday 10/26.                           - [facility] will sign-off; we can arrange outpatient                            follow-up with [place]; please call with any questions;                            thank you for the consultation. Procedure Code(s):        --- Professional ---                           [phone], Esophagogastroduodenoscopy, flexible,                            transoral; diagnostic, including collection of                            specimen(s) by brushing or washing, when performed                            (separate  procedure) Diagnosis Code(s):        --- Professional ---                           K20.90, Esophagitis, unspecified without bleeding                           K29.70, Gastritis, unspecified, without bleeding                           R10.13, Epigastric pain                           K92.0, Hematemesis                           K92.1, Melena (includes Hematochezia) CPT copyright 2019 American Medical Association. All rights reserved. The codes documented in this report are preliminary and upon coder review may  be revised to meet current compliance requirements. Arta Silence, MD 02/02/2020 1:28:45 PM This report has been signed electronically. Number of Addenda: 0

## 2020-02-02 NOTE — Interval H&P Note (Signed)
History and Physical Interval Note:  02/02/2020 12:41 PM  Troy Rush  has presented today for surgery, with the diagnosis of GI bleeding.  The various methods of treatment have been discussed with the patient and family. After consideration of risks, benefits and other options for treatment, the patient has consented to  Procedure(s): ESOPHAGOGASTRODUODENOSCOPY (EGD) WITH PROPOFOL (N/A) as a surgical intervention.  The patient's history has been reviewed, patient examined, no change in status, stable for surgery.  I have reviewed the patient's chart and labs.  Questions were answered to the patient's satisfaction.     Landry Dyke

## 2020-02-02 NOTE — Progress Notes (Signed)
CRITICAL VALUE ALERT  Critical Value:  Troponin 459  Date & Time Notied:  02/02/20 0830  Provider Notified: Dr. Harrell Gave  Orders Received/Actions taken: No further interventions at this time

## 2020-02-02 NOTE — Progress Notes (Signed)
PROGRESS NOTE    Troy Rush  VZC:[id number] DOB: 06-Apr-1971 DOA: 01/31/2020 PCP: [name], FNP   Brief Narrative:  The patient is a 49 year old Obese African-American male who is hearing impaired with a past medical history significant for not limited to nonischemic cardiomyopathy status post ICD placement, history of PE and DVT, history of diabetes mellitus type 2, deafness, history of pancreatitis, chronic combined systolic and diastolic CHF as well as other comorbidities who presented to the ED for chief complaint of nausea vomiting and hematemesis with epigastric discomfort last 24 hours.  He has had multiple episodes of frank hematemesis that has lasted for last few days and he is also noted some black stools.  The patient is on Eliquis for a history of PE and upon presentation to the ED he is found to be in atrial fibrillation with RVR with brief episodes of nonsustained ventricular tachycardia.  He was started on amiodarone drip given his history of heart failure as diltiazem and carvedilol cannot really be used.  He is found to be in DKA as well and had an elevated anion gap of 23.  CT of the abdomen pelvis was done and was unremarkable.  He was started on insulin drip for DKA protocol and amiodarone drip and Pepcid given that Protonix was not able to be used given the arrhythmia  **Interim History Patient's A. fib with RVR spontaneously converted to normal Sinus Rhythm started and remained on amiodarone drip.  DKA improved and he was transitioned off of insulin drip to long-acting and is now eating.  EGD done today and showed no evidence of active bleeding and bleeding likely from esophagitis and gastritis.  GI recommending holding Eliquis until 02/04/2020  Patient is improving but did have some nausea and slight vomiting this morning but no blood in it.   Assessment & Plan:   Principal Problem:   Atrial fibrillation with RVR (HCC) Active Problems:   Deaf   NICM  (nonischemic cardiomyopathy) (HCC)   NSVT (nonsustained ventricular tachycardia) (HCC)   Acute GI bleeding   DKA (diabetic ketoacidosis) (HCC)  A. fib with RVR and nonsustained V. tach, new onset, with conversion to normal sinus rhythm -Presently on amiodarone drip. Will cycle cardiac markers and check thyroid function test.  -Patient is n.p.o. since patient is having intractable nausea vomiting.  -Cardiology has been notified.  -Not on anticoagulation and will hold his Eliquis since patient is having bleeding. -Patient's CHA2DS2-VASc score is elevated at least 3 -Cardiology consulted and recommending IV amiodarone and holding carvedilol and digoxin for now and no diltiazem given his reduced EF next-cardiology will be adding low-dose oral metoprolol in the short-term 12.5 mg tartrate twice daily and recommending uptitrating as necessary and holding if blood pressure drops below 90 systolic -Cardiology recommending continuing to hold apixaban and GI recommending resuming it on Tuesday 1026 -Patient spontaneously converted yesterday and cardiology recommending continue IV amiodarone for now transitioning to oral meds once he is tolerating p.o. -Cardiology recommending to hold digoxin for his AKI and holding diltiazem given his reduced EF -Continuing metoprolol 12.5 mg twice daily and uptitrating as needed -Follow-up on further cardiology recommendations  Chronic systolic and diastolic CHF Nonischemic cardiomyopathy Elevated troponin -Cardiology recommending holding his heart failure medications at this time given his AKI and borderline blood pressures and recommending continue hydration given his ketosis and elevated creatinine -He does appear dry and cardiology recommending holding Lasix, Imdur and Entresto at this time -Elevated troponin is worsening and  in the setting of demand ischemia from his A. fib with RVR; troponin has gone from 79 is now 393 -> 459 -Patient is not complaining of  any chest pain -Patient's BP is now improving  -Continue strict I's and O's and daily weights but this has not been documented next- -cardiology recommending to continue to hold medicines as above  Acute GI bleed with hematemesis for which patient has been placed on Pepcid.  -Avoiding Protonix due to nonsustained V. tach. Gastroenterologist has been notified through secure chat.  -Type and screen and follow CBCs -GI feels that his cardiac situation is most pressing matter at this time and would not pursue endoscopy today unless he has life-threatening bleeding; Dr. Paulita Fujita recommends holding anticoagulation, repleting his volume and ideally medically optimized and patient performing EGD once he is more stable next 1 to 2 days. -Gastroenterology recommending PPI and he did get IV pantoprazole 40 mg x 1 -Currently on IV famotidine 20 mg q. 12 but will discuss with cardiology about switching back to Protonix and they are ok with resuming IV PPI  Abdominal pain with nausea vomiting and hematemesis, improving -Gastroenterology consulted. Lipase mildly elevated CT abdomen pelvis is unremarkable.  -Could be from peptic ulcer disease. Follow lipase and LFTs.  -Presently n.p.o. -Currently on IV famotidine and received IV PPI once; IV PPI every 12 was added and will continue for another 24 hours per GI recommendations -Continue supportive care and antiemetics at home have started IV Zofran and IV Compazine -IV fluids have now stopped -EGD done today and showed LA grade B esophagitis, gastritis as well as a normal duodenal bulb in the fourth portion of the duodenum and second portion of the duodenum.  Bleeding was likely from esophagitis and gastritis there is no endoscopic findings just high risk for rebleeding -GI recommending continue IV PPI twice daily for another 24 hours and then transitioning to pantoprazole 40 mg p.o. twice daily for 6 weeks and then pantoprazole 40 mill daily daily  thereafter indefinitely -GI recommending holding Eliquis in a few days and okay to resume on Tuesday 1026  Diabetic ketoacidosis  in the setting of SLGT-2 Inhibitor usage, improved and resolved -could be precipitated from nausea vomiting. Presently on IV insulin drip  and was transitioned last night to 24 units of Lantus -follow metabolic panel closely. -CBGs ranging from 129-190 -His D5 hours can be stopped -CO2 is improved and last CO2 was 23, anion gap was 15, chloride level of 97; on admission his anion gap was 23 -He is now on moderate NovoLog sliding scale every 4 hours -Patient's beta hydroxybutyrate on admission was 4.82 and trended down to 0.73 now -Patient is now on a soft carb modified diet  Acute renal failure on chronic kidney disease stage IIIb with hyperkalemia probably precipitated from vomiting and dehydration.  -BUNs/creatinine is worsening and went from 25/1.86 and trended up to 31/2.31 and is now 31/2.59 -> 36/3.01 -> 35/2.83 -Hold his nephrotoxic medications and avoid contrast dyes and hypotension -Hyperkalemia has improved and now is actually hypokalemic and is 3.4 -Renally adjust medications and continue to monitor renal function carefully and continue with supportive care and IVF Hydration is now going to stop that he is eating  -Continue monitor and trend and repeat CMP in a.m.  History of PE  -holding of anticoagulation due to acute GI bleed. GI recommending holding until 02/04/20  Acquired Thrombophilia -In the setting of his paroxysmal atrial fibrillation -Patient's CHA2DS2-VASc is 3 -Resume anticoagulation on 02/04/2020  per GI recommendation  History of deafness.   I used the interpreter [id number] yesterday to communicate but I used writing today and patient responded without the use of an interpreter  Normocytic Anemia -In the setting of his GI bleeding and vomiting hematemesis -Patient's hemoglobin/hematocrit went from 13.2/41.8 on admission is  trended all the way down to 11.9/30.1 and likely partially in the setting of dilutional drop -Patient's hemoglobin/hematocrit is now 11.8/37.7 and has no further active bleeding and no more hematemesis -IV fluids do not stop -Continue to monitor for further signs and symptoms of bleeding; currently no overt bleeding noted now -Repeat CBC in a.m.   DVT prophylaxis: SCDs; resume Eliquis on 02/04/2020 given GI recommendations Code Status: FULL CODE  Family Communication: No family present at bedside  Disposition Plan: Pending further clinical improvement and clearance by cardiology and gastroenterology; likely can be transferred out of the stepdown unit now that he is off of the insulin drip and because his heart rate is converted to normal sinus rhythm  Status is: Inpatient  Remains inpatient appropriate because:Unsafe d/c plan, IV treatments appropriate due to intensity of illness or inability to take PO and Inpatient level of care appropriate due to severity of illness   Dispo: The patient is from: Home              Anticipated d/c is to: Home              Anticipated d/c date is: 2 days              Patient currently is not medically stable to d/c.  Consultants:   Gastroenterology  Cardiology    Procedures:  EGD Findings:      LA Grade B (one or more mucosal breaks greater than 5 mm, not extending       between the tops of two mucosal folds) esophagitis was found.      The exam of the esophagus was otherwise normal.      Patchy moderate inflammation was found in the cardia, in the gastric       fundus and in the gastric body.      The exam of the stomach was otherwise normal.      The duodenal bulb, first portion of the duodenum and second portion of       the duodenum were normal.      No old or fresh blood was seen to the extent of our examination. Impression:               - LA Grade B esophagitis.                           - Gastritis.                           -  Normal duodenal bulb, first portion of the                            duodenum and second portion of the duodenum.                           - Bleeding likely from esophagitis and gastritis,  but no endoscopic findings to suggest high risk for                            rebleeding. Moderate Sedation:      None Recommendation:           - Return patient to ICU for ongoing care.                           - Soft diet today.                           - Continue present medications.                           - IV PPI BID x 24 hours, then transition to                            pantoprazole 40 mg po bid x 6 weeks, then                            pantoprazole 40 mg po qd thereafter indefinitely.                           - Hold Eliquis for another couple days, OK to                            resume Tuesday 10/26.                           - [facility] will sign-off; we can arrange outpatient                            follow-up with [place]; please call with any questions;                            thank you for the consultation.  Antimicrobials:  Anti-infectives (From admission, onward)   None       Subjective: Seen and examined at bedside this morning he was little bit sleepy but was nauseous or vomiting earlier but no blood in it.  Denies any abdominal pain.  States he feels all right.  Awaiting to get an EGD.  No other concerns or complaints at this time.  Heart rate is improved and he feels better than yesterday.  Objective: Vitals:   02/02/20 1320 02/02/20 1330 02/02/20 1400 02/02/20 1500  BP: 139/81 (!) 120/94 (!) 170/97 (!) 173/92  Pulse:  83 86 94  Resp: 17 13 (!) 8 16  Temp:      TempSrc:      SpO2: 99% 98% 100% 99%  Weight:      Height:        Intake/Output Summary (Last 24 hours) at 02/02/2020 1527 Last data filed at 02/02/2020 1500 Gross per 24 hour  Intake 4073.12 ml  Output 350 ml  Net 3723.12 ml   Filed Weights   01/31/20 2158  Weight:  99.8 kg   Examination: Physical Exam:  Constitutional: WN/WD obese African-American male who is deaf but  appears calm and sleepy Eyes: Lids and conjunctivae normal, sclerae anicteric  ENMT: External Ears, Nose appear normal. Grossly normal hearing. Mucous membranes are moist. Posterior pharynx clear of any exudate or lesions. Normal dentition.  Neck: Appears normal, supple, no cervical masses, normal ROM, no appreciable thyromegaly; no JVD Respiratory: Diminished to auscultation bilaterally, no wheezing, rales, rhonchi or crackles. Normal respiratory effort and patient is not tachypenic. No accessory muscle use.  Unlabored breathing Cardiovascular: RRR, no murmurs / rubs / gallops. S1 and S2 auscultated. No extremity edema. Abdomen: Soft, mildly tender, distended secondary to body habitus. Bowel sounds positive.  GU: Deferred. Musculoskeletal: No clubbing / cyanosis of digits/nails. No joint deformity upper and lower extremities.  Skin: No rashes, lesions, ulcers on limited skin evaluation. No induration; Warm and dry.  Neurologic: CN 2-12 grossly intact with no focal deficits. Romberg and cerebellar reflexes not assessed.  Psychiatric: Normal judgment and insight. Alert and oriented x 3. Normal mood and appropriate affect.   Data Reviewed: I have personally reviewed following labs and imaging studies  CBC: Recent Labs  Lab 01/31/20 2217 01/31/20 2217 02/01/20 0520 02/01/20 0800 02/01/20 1305 02/01/20 1705 02/02/20 0239  WBC 4.9   < > 7.0 6.3 9.0 8.7 8.0  NEUTROABS 3.8  --   --   --   --   --  5.8  HGB 13.2   < > 12.4* 11.9* 11.9* 12.0* 11.8*  HCT 41.8   < > 38.9* 38.0* 38.1* 38.7* 37.7*  MCV 93.7   < > 93.3 94.5 95.0 95.3 95.2  PLT 270   < > 283 239 256 215 190   < > = values in this interval not displayed.   Basic Metabolic Panel: Recent Labs  Lab 01/31/20 2317 02/01/20 0520 02/01/20 1305 02/01/20 1459 02/01/20 1705 02/01/20 2101 02/02/20 0239  NA 138   < > 136 138  137 135 135  K 5.6*   < > 6.1* 3.7 3.9 3.5 3.4*  CL 94*   < > 98 98 97* 96* 97*  CO2 21*   < > 24 28 25 26 23   GLUCOSE 381*   < > 200* 185* 167* 238* 176*  BUN 25*   < > 33* 37* 34* 36* 35*  CREATININE 1.86*   < > 3.15* 2.97* 2.84* 3.01* 2.83*  CALCIUM 9.0   < > 8.6* 9.0 9.0 8.9 8.6*  MG 1.8  --   --   --   --   --  1.9  PHOS  --   --   --   --   --   --  4.0   < > = values in this interval not displayed.   GFR: Estimated Creatinine Clearance: 37 mL/min (A) (by C-G formula based on SCr of 2.83 mg/dL (H)). Liver Function Tests: Recent Labs  Lab 01/31/20 2317 02/02/20 0239  AST 39 25  ALT 24 19  ALKPHOS 59 46  BILITOT 2.1* 1.0  PROT 7.5 6.4*  ALBUMIN 3.5 3.1*   Recent Labs  Lab 01/31/20 2317  LIPASE 73*   No results for input(s): AMMONIA in the last 168 hours. Coagulation Profile: No results for input(s): INR, PROTIME in the last 168 hours. Cardiac Enzymes: No results for input(s): CKTOTAL, CKMB, CKMBINDEX, TROPONINI in the last 168 hours. BNP (last 3 results) No results for input(s): PROBNP in the last 8760 hours. HbA1C: No results for input(s): HGBA1C in the last 72 hours. CBG: Recent Labs  Lab 02/02/20 0100 02/02/20 0201  02/02/20 0308 02/02/20 0755 02/02/20 1207  GLUCAP 136* 143* 158* 129* 176*   Lipid Profile: No results for input(s): CHOL, HDL, LDLCALC, TRIG, CHOLHDL, LDLDIRECT in the last 72 hours. Thyroid Function Tests: Recent Labs    02/01/20 0553  TSH 3.073   Anemia Panel: No results for input(s): VITAMINB12, FOLATE, FERRITIN, TIBC, IRON, RETICCTPCT in the last 72 hours. Sepsis Labs: Recent Labs  Lab 02/01/20 0520  LATICACIDVEN 2.4*    Recent Results (from the past 240 hour(s))  Respiratory Panel by RT PCR (Flu A&B, Covid) - Nasopharyngeal Swab     Status: None   Collection Time: 02/01/20  1:38 AM   Specimen: Nasopharyngeal Swab  Result Value Ref Range Status   SARS Coronavirus 2 by RT PCR NEGATIVE NEGATIVE Final    Comment:  (NOTE) SARS-CoV-2 target nucleic acids are NOT DETECTED.  The SARS-CoV-2 RNA is generally detectable in upper respiratoy specimens during the acute phase of infection. The lowest concentration of SARS-CoV-2 viral copies this assay can detect is 131 copies/mL. A negative result does not preclude SARS-Cov-2 infection and should not be used as the sole basis for treatment or other patient management decisions. A negative result may occur with  improper specimen collection/handling, submission of specimen other than nasopharyngeal swab, presence of viral mutation(s) within the areas targeted by this assay, and inadequate number of viral copies (<131 copies/mL). A negative result must be combined with clinical observations, patient history, and epidemiological information. The expected result is Negative.  Fact Sheet for Patients:  PinkCheek.be  Fact Sheet for Healthcare Providers:  GravelBags.it  This test is no t yet approved or cleared by the [place] FDA and  has been authorized for detection and/or diagnosis of SARS-CoV-2 by FDA under an Emergency Use Authorization (EUA). This EUA will remain  in effect (meaning this test can be used) for the duration of the COVID-19 declaration under Section 564(b)(1) of the Act, 21 U.S.C. section 360bbb-3(b)(1), unless the authorization is terminated or revoked sooner.     Influenza A by PCR NEGATIVE NEGATIVE Final   Influenza B by PCR NEGATIVE NEGATIVE Final    Comment: (NOTE) The Xpert Xpress SARS-CoV-2/FLU/RSV assay is intended as an aid in  the diagnosis of influenza from Nasopharyngeal swab specimens and  should not be used as a sole basis for treatment. Nasal washings and  aspirates are unacceptable for Xpert Xpress SARS-CoV-2/FLU/RSV  testing.  Fact Sheet for Patients: PinkCheek.be  Fact Sheet for Healthcare  Providers: GravelBags.it  This test is not yet approved or cleared by the [place] FDA and  has been authorized for detection and/or diagnosis of SARS-CoV-2 by  FDA under an Emergency Use Authorization (EUA). This EUA will remain  in effect (meaning this test can be used) for the duration of the  Covid-19 declaration under Section 564(b)(1) of the Act, 21  U.S.C. section 360bbb-3(b)(1), unless the authorization is  terminated or revoked. Performed at [facility], [place] [address] [place], [place] [address]   MRSA PCR Screening     Status: None   Collection Time: 02/01/20  8:20 PM   Specimen: Nasopharyngeal  Result Value Ref Range Status   MRSA by PCR NEGATIVE NEGATIVE Final    Comment:        The GeneXpert MRSA Assay (FDA approved for NASAL specimens only), is one component of a comprehensive MRSA colonization surveillance program. It is not intended to diagnose MRSA infection nor to guide or monitor treatment for MRSA infections. Performed  at [facility], [place] [address] [place], [place] [address]     RN Pressure Injury Documentation:     Estimated body mass index is 31.57 kg/m as calculated from the following:   Height as of this encounter: 5\' 10"  (1.778 m).   Weight as of this encounter: 99.8 kg.  Malnutrition Type:   Malnutrition Characteristics:    Nutrition Interventions:   Radiology Studies: CT Abdomen Pelvis Wo Contrast  Result Date: 02/01/2020 CLINICAL DATA:  Abdominal pain and vomiting EXAM: CT ABDOMEN AND PELVIS WITHOUT CONTRAST TECHNIQUE: Multidetector CT imaging of the abdomen and pelvis was performed following the standard protocol without IV contrast. COMPARISON:  Aug 19, 2019 FINDINGS: Lower chest: There is mild cardiomegaly present. No hiatal hernia. The visualized portions of the lungs are clear. Hepatobiliary: Although limited due to the lack of intravenous contrast, normal in  appearance without gross focal abnormality. No evidence of calcified gallstones or biliary ductal dilatation. Pancreas:  Unremarkable.  No surrounding inflammatory changes. Spleen: Normal in size. Although limited due to the lack of intravenous contrast, normal in appearance. Adrenals/Urinary Tract: Both adrenal glands appear normal. The kidneys and collecting system appear normal without evidence of urinary tract calculus or hydronephrosis. Bladder is unremarkable. Stomach/Bowel: The stomach, small bowel, and colon are normal in appearance. No inflammatory changes or obstructive findings. A moderate amount of colonic stool is present. Appendix is normal. Vascular/Lymphatic: There are no enlarged abdominal or pelvic lymph nodes. Scattered mild aortic atherosclerotic calcifications are seen without aneurysmal dilatation. Reproductive: The prostate is unremarkable. Other: No evidence of abdominal wall mass or hernia. Musculoskeletal: No acute or significant osseous findings. IMPRESSION: No acute intra-abdominal or pelvic pathology to explain the patient's symptoms. Moderate amount of colonic stool Aortic Atherosclerosis (ICD10-I70.0). Electronically Signed   By: Prudencio Pair M.D.   On: 02/01/2020 02:37   US RENAL  Result Date: 02/01/2020 CLINICAL DATA:  49 year old male with acute on chronic kidney injury. EXAM: RENAL / URINARY TRACT ULTRASOUND COMPLETE COMPARISON:  Prior CTs FINDINGS: Right Kidney: Renal measurements: 11.6 x 5.3 x 6.0 cm = volume: 491 mL. Echogenicity within normal limits. No mass or hydronephrosis visualized. Left Kidney: Renal measurements: 12.4 x 8.5 x 8.8 cm = volume: 791 mL. Echogenicity within normal limits. No mass or hydronephrosis visualized. Bladder: Appears normal for degree of bladder distention. Other: None. IMPRESSION: Unremarkable renal ultrasound. Electronically Signed   By: Margarette Canada M.D.   On: 02/01/2020 16:30   DG ABD ACUTE 2+V W 1V CHEST  Result Date:  01/31/2020 CLINICAL DATA:  Abdominal pain and vomiting starting today. EXAM: DG ABDOMEN ACUTE WITH 1 VIEW CHEST COMPARISON:  Chest 08/18/2019.  CT abdomen and pelvis 08/19/2019 FINDINGS: Cardiac pacemaker. Shallow inspiration. Heart size and pulmonary vascularity are normal for technique. Lungs are clear. No pleural effusions. No pneumothorax. Scattered gas and stool throughout the colon. No small or large bowel distention. No free intra-abdominal air. No abnormal air-fluid levels. No radiopaque stones. Calcified phleboliths in the pelvis. Visualized bones and soft tissue contours are normal. IMPRESSION: No evidence of active pulmonary disease. Nonobstructive bowel gas pattern. Electronically Signed   By: Lucienne Capers M.D.   On: 01/31/2020 23:52   Scheduled Meds: . carvedilol  12.5 mg Oral BID WC  . Chlorhexidine Gluconate Cloth  6 each Topical Daily  . insulin aspart  0-15 Units Subcutaneous Q4H  . insulin glargine  24 Units Subcutaneous Q24H  . mouth rinse  15 mL Mouth Rinse BID  . pantoprazole (PROTONIX)  IV  40 mg Intravenous Q12H   Continuous Infusions: . sodium chloride Stopped (01/31/20 2331)  . amiodarone 30 mg/hr (02/02/20 1027)  . famotidine (PEPCID) IV Stopped (02/02/20 1014)    LOS: 1 day   Kerney Elbe, DO [facility] PAGER is on AMION  If 7PM-7AM, please contact night-coverage [email or web address]

## 2020-02-03 ENCOUNTER — Encounter (HOSPITAL_COMMUNITY): Payer: Self-pay | Admitting: Gastroenterology

## 2020-02-03 DIAGNOSIS — I4891 Unspecified atrial fibrillation: Secondary | ICD-10-CM | POA: Diagnosis not present

## 2020-02-03 DIAGNOSIS — E1110 Type 2 diabetes mellitus with ketoacidosis without coma: Secondary | ICD-10-CM | POA: Diagnosis not present

## 2020-02-03 DIAGNOSIS — N179 Acute kidney failure, unspecified: Secondary | ICD-10-CM

## 2020-02-03 DIAGNOSIS — I5042 Chronic combined systolic (congestive) and diastolic (congestive) heart failure: Secondary | ICD-10-CM | POA: Diagnosis not present

## 2020-02-03 DIAGNOSIS — I428 Other cardiomyopathies: Secondary | ICD-10-CM | POA: Diagnosis not present

## 2020-02-03 DIAGNOSIS — K922 Gastrointestinal hemorrhage, unspecified: Secondary | ICD-10-CM | POA: Diagnosis not present

## 2020-02-03 DIAGNOSIS — H9193 Unspecified hearing loss, bilateral: Secondary | ICD-10-CM | POA: Diagnosis not present

## 2020-02-03 DIAGNOSIS — N189 Chronic kidney disease, unspecified: Secondary | ICD-10-CM

## 2020-02-03 LAB — PHOSPHORUS: Phosphorus: 5 mg/dL — ABNORMAL HIGH (ref 2.5–4.6)

## 2020-02-03 LAB — CBC WITH DIFFERENTIAL/PLATELET
Abs Immature Granulocytes: 0.02 10*3/uL (ref 0.00–0.07)
Basophils Absolute: 0 10*3/uL (ref 0.0–0.1)
Basophils Relative: 0 %
Eosinophils Absolute: 0.1 10*3/uL (ref 0.0–0.5)
Eosinophils Relative: 1 %
HCT: 37.2 % — ABNORMAL LOW (ref 39.0–52.0)
Hemoglobin: 11.4 g/dL — ABNORMAL LOW (ref 13.0–17.0)
Immature Granulocytes: 0 %
Lymphocytes Relative: 16 %
Lymphs Abs: 1.1 10*3/uL (ref 0.7–4.0)
MCH: 29.9 pg (ref 26.0–34.0)
MCHC: 30.6 g/dL (ref 30.0–36.0)
MCV: 97.6 fL (ref 80.0–100.0)
Monocytes Absolute: 0.8 10*3/uL (ref 0.1–1.0)
Monocytes Relative: 12 %
Neutro Abs: 4.8 10*3/uL (ref 1.7–7.7)
Neutrophils Relative %: 71 %
Platelets: 191 10*3/uL (ref 150–400)
RBC: 3.81 MIL/uL — ABNORMAL LOW (ref 4.22–5.81)
RDW: 13.2 % (ref 11.5–15.5)
WBC: 6.8 10*3/uL (ref 4.0–10.5)
nRBC: 0 % (ref 0.0–0.2)

## 2020-02-03 LAB — GLUCOSE, CAPILLARY
Glucose-Capillary: 121 mg/dL — ABNORMAL HIGH (ref 70–99)
Glucose-Capillary: 137 mg/dL — ABNORMAL HIGH (ref 70–99)
Glucose-Capillary: 174 mg/dL — ABNORMAL HIGH (ref 70–99)
Glucose-Capillary: 183 mg/dL — ABNORMAL HIGH (ref 70–99)
Glucose-Capillary: 323 mg/dL — ABNORMAL HIGH (ref 70–99)
Glucose-Capillary: 357 mg/dL — ABNORMAL HIGH (ref 70–99)

## 2020-02-03 LAB — COMPREHENSIVE METABOLIC PANEL
ALT: 17 U/L (ref 0–44)
AST: 27 U/L (ref 15–41)
Albumin: 3.2 g/dL — ABNORMAL LOW (ref 3.5–5.0)
Alkaline Phosphatase: 47 U/L (ref 38–126)
Anion gap: 13 (ref 5–15)
BUN: 36 mg/dL — ABNORMAL HIGH (ref 6–20)
CO2: 28 mmol/L (ref 22–32)
Calcium: 9.1 mg/dL (ref 8.9–10.3)
Chloride: 99 mmol/L (ref 98–111)
Creatinine, Ser: 2.85 mg/dL — ABNORMAL HIGH (ref 0.61–1.24)
GFR, Estimated: 26 mL/min — ABNORMAL LOW (ref >60)
Glucose, Bld: 111 mg/dL — ABNORMAL HIGH (ref 70–99)
Potassium: 3.7 mmol/L (ref 3.5–5.1)
Sodium: 140 mmol/L (ref 135–145)
Total Bilirubin: 1.2 mg/dL (ref 0.3–1.2)
Total Protein: 6.4 g/dL — ABNORMAL LOW (ref 6.5–8.1)

## 2020-02-03 LAB — BASIC METABOLIC PANEL
Anion gap: 11 (ref 5–15)
BUN: 41 mg/dL — ABNORMAL HIGH (ref 6–20)
CO2: 25 mmol/L (ref 22–32)
Calcium: 8.5 mg/dL — ABNORMAL LOW (ref 8.9–10.3)
Chloride: 96 mmol/L — ABNORMAL LOW (ref 98–111)
Creatinine, Ser: 2.65 mg/dL — ABNORMAL HIGH (ref 0.61–1.24)
GFR, Estimated: 28 mL/min — ABNORMAL LOW (ref >60)
Glucose, Bld: 363 mg/dL — ABNORMAL HIGH (ref 70–99)
Potassium: 4.2 mmol/L (ref 3.5–5.1)
Sodium: 132 mmol/L — ABNORMAL LOW (ref 135–145)

## 2020-02-03 LAB — HEMOGLOBIN A1C
Hgb A1c MFr Bld: 11 % — ABNORMAL HIGH (ref 4.8–5.6)
Mean Plasma Glucose: 269 mg/dL

## 2020-02-03 LAB — MAGNESIUM: Magnesium: 1.8 mg/dL (ref 1.7–2.4)

## 2020-02-03 MED ORDER — SODIUM CHLORIDE 0.9 % IV SOLN: INTRAVENOUS | Status: DC

## 2020-02-03 MED ORDER — ISOSORBIDE MONONITRATE ER 60 MG PO TB24
30.0000 mg | ORAL_TABLET | Freq: Every day | ORAL | Status: DC
  Administered 2020-02-03 – 2020-02-04 (×2): 30 mg via ORAL
  Filled 2020-02-03 (×2): qty 1

## 2020-02-03 MED ORDER — HYDRALAZINE HCL 25 MG PO TABS
25.0000 mg | ORAL_TABLET | Freq: Four times a day (QID) | ORAL | Status: DC
  Administered 2020-02-03 – 2020-02-04 (×4): 25 mg via ORAL
  Filled 2020-02-03 (×4): qty 1

## 2020-02-03 MED ORDER — AMIODARONE HCL 200 MG PO TABS
200.0000 mg | ORAL_TABLET | Freq: Two times a day (BID) | ORAL | Status: DC
  Administered 2020-02-03 – 2020-02-04 (×3): 200 mg via ORAL
  Filled 2020-02-03 (×3): qty 1

## 2020-02-03 MED ORDER — PANTOPRAZOLE SODIUM 40 MG PO TBEC
40.0000 mg | DELAYED_RELEASE_TABLET | Freq: Two times a day (BID) | ORAL | Status: DC
  Administered 2020-02-03 – 2020-02-04 (×2): 40 mg via ORAL
  Filled 2020-02-03 (×2): qty 1

## 2020-02-03 MED ORDER — POTASSIUM CHLORIDE CRYS ER 20 MEQ PO TBCR
40.0000 meq | EXTENDED_RELEASE_TABLET | Freq: Once | ORAL | Status: AC
  Administered 2020-02-03: 40 meq via ORAL
  Filled 2020-02-03: qty 2

## 2020-02-03 MED ORDER — MAGNESIUM SULFATE IN D5W 1-5 GM/100ML-% IV SOLN
1.0000 g | Freq: Once | INTRAVENOUS | Status: AC
  Administered 2020-02-03: 1 g via INTRAVENOUS
  Filled 2020-02-03: qty 100

## 2020-02-03 NOTE — Progress Notes (Signed)
Progress Note  Patient Name: Troy Rush Date of Encounter: 02/03/2020  [facility] Cardiologist: Dr. Aundra Dubin   Subjective   Communicated via writing pad. No chest pain or dyspnea.   Inpatient Medications    Scheduled Meds: . carvedilol  12.5 mg Oral BID WC  . Chlorhexidine Gluconate Cloth  6 each Topical Daily  . insulin aspart  0-15 Units Subcutaneous Q4H  . insulin glargine  24 Units Subcutaneous Q24H  . mouth rinse  15 mL Mouth Rinse BID  . pantoprazole (PROTONIX) IV  40 mg Intravenous Q12H   Continuous Infusions: . sodium chloride Stopped (01/31/20 2331)  . sodium chloride 65 mL/hr at 02/03/20 0907  . amiodarone 30 mg/hr (02/03/20 0700)  . famotidine (PEPCID) IV Stopped (02/02/20 2225)  . magnesium sulfate bolus IVPB     PRN Meds: dextrose, hydrALAZINE, morphine injection, prochlorperazine   Vital Signs    Vitals:   02/03/20 0500 02/03/20 0600 02/03/20 0745 02/03/20 0758  BP: (!) 157/85 (!) 157/101  (!) 176/91  Pulse: 80 73  77  Resp: 13 (!) 5  17  Temp: 98.1 F (36.7 C)  98.9 F (37.2 C)   TempSrc: Oral  Oral   SpO2: 97% 97%  99%  Weight:      Height:        Intake/Output Summary (Last 24 hours) at 02/03/2020 0935 Last data filed at 02/03/2020 0700 Gross per 24 hour  Intake 895.52 ml  Output --  Net 895.52 ml   Last 3 Weights 01/31/2020 12/17/2019 10/07/2019  Weight (lbs) 220 lb 228 lb 3.2 oz 217 lb 6.4 oz  Weight (kg) 99.791 kg 103.511 kg 98.612 kg      Telemetry    Sinus rhythm at controlled rate  - Personally Reviewed  ECG   N/A  Physical Exam   GEN: No acute distress.   Neck: No JVD Cardiac: RRR, no murmurs, rubs, or gallops.  Respiratory: Clear to auscultation bilaterally. GI: Soft, nontender, non-distended  MS: No edema; No deformity. Neuro:  Nonfocal  Psych: Normal affect   Labs    High Sensitivity Troponin:   Recent Labs  Lab 02/01/20 0520 02/01/20 0705 02/02/20 0716  TROPONINIHS 79* 393* 459*       Chemistry Recent Labs  Lab 01/31/20 2317 02/01/20 0520 02/01/20 2101 02/02/20 0239 02/03/20 0305  NA 138   < > 135 135 140  K 5.6*   < > 3.5 3.4* 3.7  CL 94*   < > 96* 97* 99  CO2 21*   < > 26 23 28   GLUCOSE 381*   < > 238* 176* 111*  BUN 25*   < > 36* 35* 36*  CREATININE 1.86*   < > 3.01* 2.83* 2.85*  CALCIUM 9.0   < > 8.9 8.6* 9.1  PROT 7.5  --   --  6.4* 6.4*  ALBUMIN 3.5  --   --  3.1* 3.2*  AST 39  --   --  25 27  ALT 24  --   --  19 17  ALKPHOS 59  --   --  46 47  BILITOT 2.1*  --   --  1.0 1.2  GFRNONAA 44*   < > 24* 26* 26*  ANIONGAP 23*   < > 13 15 13    < > = values in this interval not displayed.     Hematology Recent Labs  Lab 02/01/20 1705 02/02/20 0239 02/03/20 0305  WBC 8.7 8.0 6.8  RBC  4.06* 3.96* 3.81*  HGB 12.0* 11.8* 11.4*  HCT 38.7* 37.7* 37.2*  MCV 95.3 95.2 97.6  MCH 29.6 29.8 29.9  MCHC 31.0 31.3 30.6  RDW 12.8 13.1 13.2  PLT 215 190 191    Radiology    US RENAL  Result Date: 02/01/2020 CLINICAL DATA:  49 year old male with acute on chronic kidney injury. EXAM: RENAL / URINARY TRACT ULTRASOUND COMPLETE COMPARISON:  Prior CTs FINDINGS: Right Kidney: Renal measurements: 11.6 x 5.3 x 6.0 cm = volume: 491 mL. Echogenicity within normal limits. No mass or hydronephrosis visualized. Left Kidney: Renal measurements: 12.4 x 8.5 x 8.8 cm = volume: 791 mL. Echogenicity within normal limits. No mass or hydronephrosis visualized. Bladder: Appears normal for degree of bladder distention. Other: None. IMPRESSION: Unremarkable renal ultrasound. Electronically Signed   By: Margarette Canada M.D.   On: 02/01/2020 16:30    Cardiac Studies   Echo  01/04/2019 1. Left ventricular ejection fraction, by visual estimation, is 30 to  35%. The left ventricle has moderately decreased function. Mildly  increased left ventricular size. There is mildly increased left  ventricular hypertrophy. Diffuse hypokinesis.  2. Left ventricular diastolic Doppler parameters are  consistent with  pseudonormalization pattern of LV diastolic filling.  3. The mitral valve is normal in structure. Mild mitral valve  regurgitation. No evidence of mitral stenosis.  4. The tricuspid valve is normal in structure. Tricuspid valve  regurgitation is trivial.  5. The aortic valve is tricuspid Aortic valve regurgitation is trivial by  color flow Doppler. Structurally normal aortic valve, with no evidence of  sclerosis or stenosis.  6. There is mild dilatation of the ascending aorta measuring 40 mm.  7. Right atrial size was normal.  8. Left atrial size was mild-moderately dilated.  9. Global right ventricle has normal systolic function.The right  ventricular size is normal. No increase in right ventricular wall  thickness.  10. The inferior vena cava is normal in size with greater than 50%  respiratory variability, suggesting right atrial pressure of 3 mmHg.  11. The tricuspid regurgitant velocity is 3.61 m/s, and with an assumed  right atrial pressure of 3 mmHg, the estimated right ventricular systolic  pressure is moderately elevated at 55.1 mmHg.   Patient Profile     49 y.o. male  with a hx of deafness, hypertension, type II diabetes, chronic systolic and diastolic heart failure, nonischemic cardiomyopathy s/p [employer] scientific ICDwho is being seen for the evaluation ofatrial fibrillation with rapid ventricular response in setting of upper GI bleed and DKA.   Assessment & Plan    1.  New onset atrial fibrillation with rapid ventricular rate -No prior documented atrial fibrillation. Likely in the setting of ketotic diabetic episode (without acidosis) - Previously on Eliquis indefinitively for unprovoked DVT/PE per Dr. Hyman Hopes recommendation. -Patient spontaneously converted to sinus rhythm and now maintaining. -Currently holding anticoagulation secondary to GI issue.  Per EGD note, okay to restart anticoagulation 02/04/2020. -Digoxin held secondary to AKI (see  below). -Continue Coreg 12.5 mg twice daily -Change IV amiodarone to p.o.  2. Chronic diastolic CHF/NICM - Lasix, Imdur and Entresto held this admission  - Per note he was hypotensive initially, now BP running high - Net I  & O positive 4713 - No daily weight - Appears euvolemic - Most recent BP 176/91. Start Imdur and hydralazine.  - Continue to hold Entresto due to AKI - Lasix resumption per MD - Digoxin level was low this admission.  ? Compliant. Continue to  hold.   3. Acute on CKD III - Baseline Scr 1.4-1.6 - Scr peaked at 3.15 on 10/23>>>>>stabel today at 2.85 - Avoid nephrotoxic agent  4. DM - Per primary team - Consider resuming Invokana  5. Gi bleed/Anemia - Per primary  The patient may need medication assistant. Case manager to help.    For questions or updates, please contact [place] Please consult [email or web address] for contact info under        SignedLeanor Kail, PA  02/03/2020, 9:35 AM

## 2020-02-03 NOTE — TOC Initial Note (Signed)
Transition of Care [facility]) - Initial/Assessment Note    Patient Details  Name: Troy Rush MRN: [id number] Date of Birth: 12-08-1970  [facility]) CM/SW Contact:    Leeroy Cha, RN Phone Number: 02/03/2020, 9:50 AM  Clinical Narrative:                 presented today for surgery, with the diagnosis of GI bleeding.  The various methods of treatment have been discussed with the patient and family. After consideration of risks, benefits and other options for treatment, the patient has consented to  Procedure(s): ESOPHAGOGASTRODUODENOSCOPY (EGD) WITH PROPOFOL (N/A) as a surgical intervention.  Findings:      LA Grade B (one or more mucosal breaks greater than 5 mm, not extending       between the tops of two mucosal folds) esophagitis was found.      The exam of the esophagus was otherwise normal.      Patchy moderate inflammation was found in the cardia, in the gastric       fundus and in the gastric body.      The exam of the stomach was otherwise normal.      The duodenal bulb, first portion of the duodenum and second portion of       the duodenum were normal.      No old or fresh blood was seen to the extent of our examination. Impression:               - LA Grade B esophagitis.                           - Gastritis.                           - Normal duodenal bulb, first portion of the                            duodenum and second portion of the duodenum.                           - Bleeding likely from esophagitis and gastritis,                            but no endoscopic findings to suggest high risk for                            rebleeding. Following for progression and toc needs/lives alone but does have a daughter in the area. Expected Discharge Plan: Home/Self Care Barriers to Discharge: Barriers Unresolved (comment) (atrial fibrillation and workup)   Patient Goals and CMS Choice Patient states their goals for this hospitalization and ongoing recovery  are:: to return to my home CMS Medicare.gov Compare Post Acute Care list provided to:: Patient Choice offered to / list presented to : Patient  Expected Discharge Plan and Services Expected Discharge Plan: Home/Self Care   Discharge Planning Services: CM Consult   Living arrangements for the past 2 months: Apartment                                      Prior Living  Arrangements/Services Living arrangements for the past 2 months: Apartment Lives with:: Self Patient language and need for interpreter reviewed:: Yes Do you feel safe going back to the place where you live?: Yes      Need for Family Participation in Patient Care: Yes (Comment) Care giver support system in place?: Yes (comment)   Criminal Activity/Legal Involvement Pertinent to Current Situation/Hospitalization: No - Comment as needed  Activities of Daily Living Home Assistive Devices/Equipment: None ADL Screening (condition at time of admission) Patient's cognitive ability adequate to safely complete daily activities?: Yes Is the patient deaf or have difficulty hearing?: Yes Does the patient have difficulty seeing, even when wearing glasses/contacts?: No Does the patient have difficulty concentrating, remembering, or making decisions?: Yes Patient able to express need for assistance with ADLs?: Yes Does the patient have difficulty dressing or bathing?: No Independently performs ADLs?: Yes (appropriate for developmental age) Does the patient have difficulty walking or climbing stairs?: No Weakness of Legs: None Weakness of Arms/Hands: None  Permission Sought/Granted                  Emotional Assessment Appearance:: Appears stated age Attitude/Demeanor/Rapport: Engaged Affect (typically observed): Calm Orientation: : Oriented to Place, Oriented to Self, Oriented to  Time, Oriented to Situation Alcohol / Substance Use: Not Applicable Psych Involvement: No (comment)  Admission diagnosis:  Atrial  fibrillation ([place]) [I48.91] Ventricular tachycardia ([place]) [I47.2] Atrial fibrillation with rapid ventricular response ([place]) [I48.91] Atrial fibrillation with RVR ([place]) [I48.91] Diabetic ketoacidosis without coma associated with type 2 diabetes mellitus ([place]) [E11.10] Acute kidney injury superimposed on CKD ([place]) [N17.9, N18.9] Patient Active Problem List   Diagnosis Date Noted   Atrial fibrillation with RVR ([place]) 02/01/2020   NSVT (nonsustained ventricular tachycardia) ([place]) 02/01/2020   Acute GI bleeding 02/01/2020   DKA (diabetic ketoacidosis) ([place]) 02/01/2020   Bilateral deafness 08/15/2019   Type 2 diabetes mellitus with complication, with long-term current use of insulin ([place]) 08/15/2019   Hemoglobin A1C between 7% and 9% indicating borderline diabetic control 08/15/2019   Insomnia 08/15/2019   Abdominal pain 11/14/2018   Ischemic cardiomyopathy 10/14/2016   NICM (nonischemic cardiomyopathy) ([place]) 30/94/0768   Chronic systolic CHF (congestive heart failure) ([place]) 05/30/2016   HTN (hypertension) 05/30/2016   Deaf 05/30/2016   Hyperkalemia 05/30/2016   Diabetes ([place]) 03/21/2016   Major depressive disorder, recurrent episode ([place]) 03/21/2016   PCP:  [name], FNP Pharmacy:   [facility] [place], [place] - [address] AT [facility] [address] [place] [address] Phone: [phone] Fax: [phone]     Social Determinants of Health (SDOH) Interventions    Readmission Risk Interventions No flowsheet data found.

## 2020-02-03 NOTE — Progress Notes (Signed)
Informed [facility], Jeannette Corpus NPP about patient's IV infiltration which was running Amiodarone at the time. IV removed and patient states only mild discomfort.

## 2020-02-03 NOTE — Progress Notes (Addendum)
PROGRESS NOTE    Troy Rush  CHY:[id number] DOB: 03/27/1971 DOA: 01/31/2020 PCP: [name], FNP   Brief Narrative:  The patient is a 49 year old Obese African-American male who is hearing impaired with a past medical history significant for not limited to nonischemic cardiomyopathy status post ICD placement, history of PE and DVT, history of diabetes mellitus type 2, deafness, history of pancreatitis, chronic combined systolic and diastolic CHF as well as other comorbidities who presented to the ED for chief complaint of nausea vomiting and hematemesis with epigastric discomfort last 24 hours.  He has had multiple episodes of frank hematemesis that has lasted for last few days and he is also noted some black stools.  The patient is on Eliquis for a history of PE and upon presentation to the ED he is found to be in atrial fibrillation with RVR with brief episodes of nonsustained ventricular tachycardia.  He was started on amiodarone drip given his history of heart failure as diltiazem and carvedilol cannot really be used.  He is found to be in DKA as well and had an elevated anion gap of 23.  CT of the abdomen pelvis was done and was unremarkable.  He was started on insulin drip for DKA protocol and amiodarone drip and Pepcid given that Protonix was not able to be used given the arrhythmia  **Interim History Patient's A. fib with RVR spontaneously converted to normal Sinus Rhythm started and remained on amiodarone drip.  DKA improved and he was transitioned off of insulin drip to long-acting and is now eating.  EGD done today and showed no evidence of active bleeding and bleeding likely from esophagitis and gastritis.  GI recommending holding Eliquis until 02/04/2020  Patient is improving but did have some nausea and slight vomiting this morning but no blood in it.  02/03/20: Patient's heart rate remained stable and cardiology is going to plan on changing him to p.o. amiodarone today.  His  Coreg was initiated last night.  GI recommending continuing IV PPI twice daily for 24 hours and then changing to p.o. today.  Because his renal function still remains elevated I discussed the case with [name] of Nephrology who recommends starting gentle IV fluid hydration at 65 MLS per hour for now and obtain a bladder scan and repeating a BMET this afternoon.  Patient is stable to transfer from the stepdown unit to progressive care.  Anticipating discharging home in the next 24 to 48 hours  Assessment & Plan:   Principal Problem:   Atrial fibrillation with RVR ([place]) Active Problems:   Deaf   NICM (nonischemic cardiomyopathy) (HCC)   NSVT (nonsustained ventricular tachycardia) (HCC)   Acute GI bleeding   DKA (diabetic ketoacidosis) (HCC)  A. fib with RVR and nonsustained V. tach, new onset, with conversion to normal sinus rhythm -Presently on amiodarone drip and cardiology transitioning off of this to p.o. today. Will cycle cardiac markers and check thyroid function test and troponins were slightly elevated in the setting of demand ischemia TSH was normal.  -Patient was n.p.o. as he was having intractable nausea vomiting but this is improved -Cardiology has been notified and consulted  -Not on anticoagulation and will hold his Eliquis since patient is having bleeding. -Patient's CHA2DS2-VASc score is elevated at least 3 -Cardiology consulted and recommending IV amiodarone and were holding carvedilol and digoxin for now and no diltiazem given his reduced EF  -cardiology will be adding low-dose oral metoprolol in the short-term 12.5 mg  tartrate twice daily and recommending uptitrating as necessary and holding if blood pressure drops below 90 systolic but they switched back to carvedilol yesterday evening -Cardiology recommending continuing to hold apixaban and GI recommending resuming it on Tuesday 10/26 -Patient spontaneously converted to normal sinus rhythm day before yesterday and  cardiology recommending continue IV amiodarone and will be transitioning to oral medication today and will be on amiodarone 200 mg p.o. twice daily -Cardiology recommending to hold digoxin for his AKI and holding diltiazem given his reduced EF -Metoprolol 12.5 mg twice daily changed to carvedilol 12.5 mg p.o. twice daily by Cardiology -Cardiology feels that he may not been compliant with digoxin given that his digoxin level was low on admission commending continue to hold as above -Follow-up on further cardiology recommendations and appreciate their recommendations  Chronic systolic and diastolic CHF Nonischemic cardiomyopathy Elevated troponin -Cardiology recommending holding his heart failure medications at this time given his AKI and borderline blood pressures and recommending continue hydration given his ketosis and elevated creatinine -He does appear dry and cardiology recommending holding Lasix, Imdur and Entresto at this time -Elevated troponin is worsening and in the setting of demand ischemia from his A. fib with RVR; troponin has gone from 79 is now 393 -> 459 -Patient is not complaining of any chest pain -Patient's BP is now improving  -Continue strict I's and O's and daily weights but this has not been documented  -Patient appears euvolemic and cardiology recommends to control Entresto due to AKI -Cardiology will be resuming his Imdur and hydralazine today and will also continue to hold Lasix given that we will start gentle IV fluid hydration from his AKI  Acute GI bleed with hematemesis for which patient has been placed on Pepcid.  -Was avoiding Protonix due to nonsustained V. tach but was then placed on Protonix twice daily given cardiac clearance -Type and screen and follow CBCs -He is medically optimized and underwent EGD yesterday -Gastroenterology recommending PPI and he did get IV pantoprazole 40 mg x 1 and was changed to 40 every 12 -Currently on IV famotidine 20 mg q.  12 but will discuss with cardiology about switching back to Protonix and they are ok with resuming IV PPI and he received 40 every 12 and will continue it for a total 24 hours and should be changed to p.o. a day -We will need to be on twice daily PPI p.o. 40 mg for 6 weeks and then 40 mg daily indefinitely  Abdominal pain with nausea vomiting and hematemesis, improving -Gastroenterology consulted. Lipase mildly elevated CT abdomen pelvis is unremarkable.  -Could be from peptic ulcer disease. Follow lipase and LFTs.  -Presently n.p.o. -Currently on IV famotidine and received IV PPI once; IV PPI every 12 was added and will continue for another 24 hours per GI recommendations -Continue supportive care and antiemetics at home have started IV Zofran and IV Compazine -IV fluids have now stopped -EGD done today and showed LA grade B esophagitis, gastritis as well as a normal duodenal bulb in the fourth portion of the duodenum and second portion of the duodenum.  Bleeding was likely from esophagitis and gastritis there is no endoscopic findings just high risk for rebleeding -GI recommending continue IV PPI twice daily for another 24 hours and then transitioning to pantoprazole 40 mg p.o. twice daily for 6 weeks and then pantoprazole 40 mill daily daily thereafter indefinitely -GI recommending holding Eliquis in a few days and okay to resume on Tuesday 10/26  Diabetic  ketoacidosis  in the setting of SLGT-2 Inhibitor usage, improved and resolved -could be precipitated from nausea vomiting. Presently on IV insulin drip  and was transitioned the night before last to 24 units of Lantus -follow metabolic panel closely. -CBGs ranging from 121-353 -His D5 hours can be stopped and will resume NS at 65 mL/hr  -CO2 is improved and last CO2 was 28, anion gap was 13, chloride level of 99; on admission his anion gap was 23  -He is now on moderate NovoLog sliding scale every 4 hours -Patient's beta  hydroxybutyrate on admission was 4.82 and trended down to 0.73 now -Patient is now on a soft carb modified diet  Acute renal failure on chronic kidney disease stage IIIb with hyperkalemia probably precipitated from vomiting and dehydration.  Hyperphosphatemia  -BUNs/creatinine is worsening and went from 25/1.86 and trended up to 31/2.31 and is now 31/2.59 -> 36/3.01 -> 35/2.83 -> 36/2.85 -Hold his nephrotoxic medications and avoid contrast dyes and hypotension -Hyperkalemia has improved and now is actually hypokalemic and is 3.4 and after repletion is 3.7 -Renally adjust medications and continue to monitor renal function carefully and continue with supportive care  -Baseline is around 1.4-1.7 -Had stopped IV fluid hydration yesterday given that he was eating I spoke with nephrology given his lack of improvement in his renal function and Dr. Burnett Sheng recommends resuming gentle IV fluid at 65 MLS per hour and then checking a bladder scan and repeating a BMP later this afternoon -Continue monitor and trend and repeat CMP in a.m.  History of PE  -holding of anticoagulation due to acute GI bleed. GI recommending holding until 02/04/20  Acquired Thrombophilia -In the setting of his paroxysmal atrial fibrillation -Patient's CHA2DS2-VASc is 3 -Resume anticoagulation on 02/04/2020 per GI recommendation  History of deafness.   I used the interpreter [id number] #[id number] on the day of admission patient to communicate but I used writing today and patient responded using a writing pad without the use of an interpreter  Normocytic Anemia -In the setting of his GI bleeding and vomiting hematemesis -Patient's hemoglobin/hematocrit went from 13.2/41.8 on admission is trended all the way down to 11.9/30.1 and likely partially in the setting of dilutional drop -Patient's hemoglobin/hematocrit is now slightly lower at 11.4/37.2 and has no further active bleeding and no more hematemesis -IV fluids have stopped  yesterday but will resume gently today for his AKI -Continue to monitor for further signs and symptoms of bleeding; currently no overt bleeding noted now -Repeat CBC in a.m.  Obesity -Complicates overall prognosis and care -Estimated body mass index is 31.57 kg/m as calculated from the following:   Height as of this encounter: 5\' 10"  (1.778 m).   Weight as of this encounter: 99.8 kg. -Weight Loss and Dietary Counseling given    DVT prophylaxis: SCDs; resume Eliquis on 02/04/2020 given GI recommendations Code Status: FULL CODE  Family Communication: No family present at bedside  Disposition Plan: Pending further clinical improvement and clearance by cardiology and gastroenterology; he has been deemed stable to be transferred out of the stepdown unit and will go to progressive care; will need renal function to improve little bit more prior to discharge and will be continued on IV Protonix today until later this evening and then transition to p.o.  Status is: Inpatient  Remains inpatient appropriate because:Unsafe d/c plan, IV treatments appropriate due to intensity of illness or inability to take PO and Inpatient level of care appropriate due to severity of illness   Dispo:  The patient is from: Home              Anticipated d/c is to: Home              Anticipated d/c date is: 1 day              Patient currently is not medically stable to d/c.  Consultants:   Gastroenterology  Cardiology    Procedures:  EGD Findings:      LA Grade B (one or more mucosal breaks greater than 5 mm, not extending       between the tops of two mucosal folds) esophagitis was found.      The exam of the esophagus was otherwise normal.      Patchy moderate inflammation was found in the cardia, in the gastric       fundus and in the gastric body.      The exam of the stomach was otherwise normal.      The duodenal bulb, first portion of the duodenum and second portion of       the duodenum were  normal.      No old or fresh blood was seen to the extent of our examination. Impression:               - LA Grade B esophagitis.                           - Gastritis.                           - Normal duodenal bulb, first portion of the                            duodenum and second portion of the duodenum.                           - Bleeding likely from esophagitis and gastritis,                            but no endoscopic findings to suggest high risk for                            rebleeding. Moderate Sedation:      None Recommendation:           - Return patient to ICU for ongoing care.                           - Soft diet today.                           - Continue present medications.                           - IV PPI BID x 24 hours, then transition to                            pantoprazole 40 mg po bid x 6 weeks, then  pantoprazole 40 mg po qd thereafter indefinitely.                           - Hold Eliquis for another couple days, OK to                            resume Tuesday 10/26.                           - [facility] will sign-off; we can arrange outpatient                            follow-up with [place]; please call with any questions;                            thank you for the consultation.  Antimicrobials:  Anti-infectives (From admission, onward)   None       Subjective: Seen and examined at bedside this morning he is much improved and was not nauseous or vomiting.  Denies abdominal pain.  I communicated to him using a writing pad and he felt well.  Understands that he is stable to be transferred out of the stepdown unit.  He denies any chest pain, lightheadedness or dizziness.  Anticipating discharging home in the next 24 to 48 hours and he understands agrees with the plan of care.  Objective: Vitals:   02/03/20 0758 02/03/20 0900 02/03/20 1015 02/03/20 1125  BP: (!) 176/91  (!) 182/83 135/72  Pulse: 77 82 79 78  Resp: 17 15 19  19   Temp:      TempSrc:      SpO2: 99% 99% 99% 98%  Weight:      Height:        Intake/Output Summary (Last 24 hours) at 02/03/2020 1136 Last data filed at 02/03/2020 1038 Gross per 24 hour  Intake 1039.11 ml  Output --  Net 1039.11 ml   Filed Weights   01/31/20 2158  Weight: 99.8 kg   Examination: Physical Exam:  Constitutional: WN/WD obese deaf African-American male currently in no acute distress and appears calm and comfortable. Eyes: Lids and conjunctivae normal, sclerae anicteric  ENMT: External Ears, Nose appear normal. Grossly normal hearing. Neck: Appears normal, supple, no cervical masses, normal ROM, no appreciable thyromegaly; no JVD Respiratory: Diminished to auscultation bilaterally, no wheezing, rales, rhonchi or crackles. Normal respiratory effort and patient is not tachypenic. No accessory muscle use.  Unlabored breathing Cardiovascular: RRR, no murmurs / rubs / gallops. S1 and S2 auscultated.  Minimal lower extremity edema Abdomen: Soft, non-tender, distended secondary body habitus.  Bowel sounds positive.  GU: Deferred. Musculoskeletal: No clubbing / cyanosis of digits/nails. No joint deformity upper and lower extremities.   Skin: No rashes, lesions, ulcers on limited skin evaluation. No induration; Warm and dry.  Neurologic: CN 2-12 grossly intact with no focal deficits but he is deaf bilaterally. Romberg and sign cerebellar reflexes not assessed.  Psychiatric: Normal judgment and insight. Alert and oriented x 3. Normal mood and appropriate affect.   Data Reviewed: I have personally reviewed following labs and imaging studies  CBC: Recent Labs  Lab 01/31/20 2217 02/01/20 0520 02/01/20 0800 02/01/20 1305 02/01/20 1705 02/02/20 0239 02/03/20 0305  WBC 4.9   < > 6.3 9.0 8.7 8.0 6.8  NEUTROABS 3.8  --   --   --   --  5.8 4.8  HGB 13.2   < > 11.9* 11.9* 12.0* 11.8* 11.4*  HCT 41.8   < > 38.0* 38.1* 38.7* 37.7* 37.2*  MCV 93.7   < > 94.5 95.0 95.3 95.2  97.6  PLT 270   < > 239 256 215 190 191   < > = values in this interval not displayed.   Basic Metabolic Panel: Recent Labs  Lab 01/31/20 2317 02/01/20 0520 02/01/20 1459 02/01/20 1705 02/01/20 2101 02/02/20 0239 02/03/20 0305  NA 138   < > 138 137 135 135 140  K 5.6*   < > 3.7 3.9 3.5 3.4* 3.7  CL 94*   < > 98 97* 96* 97* 99  CO2 21*   < > 28 25 26 23 28   GLUCOSE 381*   < > 185* 167* 238* 176* 111*  BUN 25*   < > 37* 34* 36* 35* 36*  CREATININE 1.86*   < > 2.97* 2.84* 3.01* 2.83* 2.85*  CALCIUM 9.0   < > 9.0 9.0 8.9 8.6* 9.1  MG 1.8  --   --   --   --  1.9 1.8  PHOS  --   --   --   --   --  4.0 5.0*   < > = values in this interval not displayed.   GFR: Estimated Creatinine Clearance: 36.7 mL/min (A) (by C-G formula based on SCr of 2.85 mg/dL (H)). Liver Function Tests: Recent Labs  Lab 01/31/20 2317 02/02/20 0239 02/03/20 0305  AST 39 25 27  ALT 24 19 17   ALKPHOS 59 46 47  BILITOT 2.1* 1.0 1.2  PROT 7.5 6.4* 6.4*  ALBUMIN 3.5 3.1* 3.2*   Recent Labs  Lab 01/31/20 2317  LIPASE 73*   No results for input(s): AMMONIA in the last 168 hours. Coagulation Profile: No results for input(s): INR, PROTIME in the last 168 hours. Cardiac Enzymes: No results for input(s): CKTOTAL, CKMB, CKMBINDEX, TROPONINI in the last 168 hours. BNP (last 3 results) No results for input(s): PROBNP in the last 8760 hours. HbA1C: No results for input(s): HGBA1C in the last 72 hours. CBG: Recent Labs  Lab 02/02/20 1655 02/02/20 2121 02/02/20 2352 02/03/20 0521 02/03/20 0810  GLUCAP 308* 257* 164* 137* 121*   Lipid Profile: No results for input(s): CHOL, HDL, LDLCALC, TRIG, CHOLHDL, LDLDIRECT in the last 72 hours. Thyroid Function Tests: Recent Labs    02/01/20 0553  TSH 3.073   Anemia Panel: No results for input(s): VITAMINB12, FOLATE, FERRITIN, TIBC, IRON, RETICCTPCT in the last 72 hours. Sepsis Labs: Recent Labs  Lab 02/01/20 0520  LATICACIDVEN 2.4*    Recent  Results (from the past 240 hour(s))  Respiratory Panel by RT PCR (Flu A&B, Covid) - Nasopharyngeal Swab     Status: None   Collection Time: 02/01/20  1:38 AM   Specimen: Nasopharyngeal Swab  Result Value Ref Range Status   SARS Coronavirus 2 by RT PCR NEGATIVE NEGATIVE Final    Comment: (NOTE) SARS-CoV-2 target nucleic acids are NOT DETECTED.  The SARS-CoV-2 RNA is generally detectable in upper respiratoy specimens during the acute phase of infection. The lowest concentration of SARS-CoV-2 viral copies this assay can detect is 131 copies/mL. A negative result does not preclude SARS-Cov-2 infection and should not be used as the sole basis for treatment or other patient management decisions. A negative result may occur with  improper specimen collection/handling,  submission of specimen other than nasopharyngeal swab, presence of viral mutation(s) within the areas targeted by this assay, and inadequate number of viral copies (<131 copies/mL). A negative result must be combined with clinical observations, patient history, and epidemiological information. The expected result is Negative.  Fact Sheet for Patients:  PinkCheek.be  Fact Sheet for Healthcare Providers:  GravelBags.it  This test is no t yet approved or cleared by the [place] FDA and  has been authorized for detection and/or diagnosis of SARS-CoV-2 by FDA under an Emergency Use Authorization (EUA). This EUA will remain  in effect (meaning this test can be used) for the duration of the COVID-19 declaration under Section 564(b)(1) of the Act, 21 U.S.C. section 360bbb-3(b)(1), unless the authorization is terminated or revoked sooner.     Influenza A by PCR NEGATIVE NEGATIVE Final   Influenza B by PCR NEGATIVE NEGATIVE Final    Comment: (NOTE) The Xpert Xpress SARS-CoV-2/FLU/RSV assay is intended as an aid in  the diagnosis of influenza from Nasopharyngeal swab  specimens and  should not be used as a sole basis for treatment. Nasal washings and  aspirates are unacceptable for Xpert Xpress SARS-CoV-2/FLU/RSV  testing.  Fact Sheet for Patients: PinkCheek.be  Fact Sheet for Healthcare Providers: GravelBags.it  This test is not yet approved or cleared by the [place] FDA and  has been authorized for detection and/or diagnosis of SARS-CoV-2 by  FDA under an Emergency Use Authorization (EUA). This EUA will remain  in effect (meaning this test can be used) for the duration of the  Covid-19 declaration under Section 564(b)(1) of the Act, 21  U.S.C. section 360bbb-3(b)(1), unless the authorization is  terminated or revoked. Performed at [facility], [place] [address], [place], [place] [address]   MRSA PCR Screening     Status: None   Collection Time: 02/01/20  8:20 PM   Specimen: Nasopharyngeal  Result Value Ref Range Status   MRSA by PCR NEGATIVE NEGATIVE Final    Comment:        The GeneXpert MRSA Assay (FDA approved for NASAL specimens only), is one component of a comprehensive MRSA colonization surveillance program. It is not intended to diagnose MRSA infection nor to guide or monitor treatment for MRSA infections. Performed at [facility], [place] [address] [place], [place] [address]     RN Pressure Injury Documentation:     Estimated body mass index is 31.57 kg/m as calculated from the following:   Height as of this encounter: 5\' 10"  (1.778 m).   Weight as of this encounter: 99.8 kg.  Malnutrition Type:   Malnutrition Characteristics:    Nutrition Interventions:   Radiology Studies: US RENAL  Result Date: 02/01/2020 CLINICAL DATA:  49 year old male with acute on chronic kidney injury. EXAM: RENAL / URINARY TRACT ULTRASOUND COMPLETE COMPARISON:  Prior CTs FINDINGS: Right Kidney: Renal measurements: 11.6 x 5.3 x 6.0 cm =  volume: 491 mL. Echogenicity within normal limits. No mass or hydronephrosis visualized. Left Kidney: Renal measurements: 12.4 x 8.5 x 8.8 cm = volume: 791 mL. Echogenicity within normal limits. No mass or hydronephrosis visualized. Bladder: Appears normal for degree of bladder distention. Other: None. IMPRESSION: Unremarkable renal ultrasound. Electronically Signed   By: Margarette Canada M.D.   On: 02/01/2020 16:30   Scheduled Meds: . amiodarone  200 mg Oral BID  . carvedilol  12.5 mg Oral BID WC  . Chlorhexidine Gluconate Cloth  6 each Topical Daily  . hydrALAZINE  25  mg Oral Q6H  . insulin aspart  0-15 Units Subcutaneous Q4H  . insulin glargine  24 Units Subcutaneous Q24H  . isosorbide mononitrate  30 mg Oral Daily  . mouth rinse  15 mL Mouth Rinse BID  . pantoprazole (PROTONIX) IV  40 mg Intravenous Q12H   Continuous Infusions: . sodium chloride Stopped (01/31/20 2331)  . sodium chloride Stopped (02/03/20 1037)  . famotidine (PEPCID) IV Stopped (02/03/20 1017)  . magnesium sulfate bolus IVPB 100 mL/hr at 02/03/20 1038    LOS: 2 days   Kerney Elbe, DO [facility] PAGER is on AMION  If 7PM-7AM, please contact night-coverage [email or web address]

## 2020-02-04 DIAGNOSIS — I4891 Unspecified atrial fibrillation: Secondary | ICD-10-CM | POA: Diagnosis not present

## 2020-02-04 DIAGNOSIS — K922 Gastrointestinal hemorrhage, unspecified: Secondary | ICD-10-CM | POA: Diagnosis not present

## 2020-02-04 DIAGNOSIS — E1110 Type 2 diabetes mellitus with ketoacidosis without coma: Secondary | ICD-10-CM | POA: Diagnosis not present

## 2020-02-04 DIAGNOSIS — I5042 Chronic combined systolic (congestive) and diastolic (congestive) heart failure: Secondary | ICD-10-CM | POA: Diagnosis not present

## 2020-02-04 DIAGNOSIS — I428 Other cardiomyopathies: Secondary | ICD-10-CM | POA: Diagnosis not present

## 2020-02-04 DIAGNOSIS — N179 Acute kidney failure, unspecified: Secondary | ICD-10-CM | POA: Diagnosis not present

## 2020-02-04 LAB — COMPREHENSIVE METABOLIC PANEL
ALT: 16 U/L (ref 0–44)
AST: 25 U/L (ref 15–41)
Albumin: 3.1 g/dL — ABNORMAL LOW (ref 3.5–5.0)
Alkaline Phosphatase: 48 U/L (ref 38–126)
Anion gap: 10 (ref 5–15)
BUN: 32 mg/dL — ABNORMAL HIGH (ref 6–20)
CO2: 25 mmol/L (ref 22–32)
Calcium: 8.9 mg/dL (ref 8.9–10.3)
Chloride: 101 mmol/L (ref 98–111)
Creatinine, Ser: 2.24 mg/dL — ABNORMAL HIGH (ref 0.61–1.24)
GFR, Estimated: 35 mL/min — ABNORMAL LOW (ref >60)
Glucose, Bld: 150 mg/dL — ABNORMAL HIGH (ref 70–99)
Potassium: 3.9 mmol/L (ref 3.5–5.1)
Sodium: 136 mmol/L (ref 135–145)
Total Bilirubin: 1.3 mg/dL — ABNORMAL HIGH (ref 0.3–1.2)
Total Protein: 6.8 g/dL (ref 6.5–8.1)

## 2020-02-04 LAB — CBC WITH DIFFERENTIAL/PLATELET
Abs Immature Granulocytes: 0.02 10*3/uL (ref 0.00–0.07)
Basophils Absolute: 0 10*3/uL (ref 0.0–0.1)
Basophils Relative: 0 %
Eosinophils Absolute: 0.2 10*3/uL (ref 0.0–0.5)
Eosinophils Relative: 2 %
HCT: 33.4 % — ABNORMAL LOW (ref 39.0–52.0)
Hemoglobin: 10.6 g/dL — ABNORMAL LOW (ref 13.0–17.0)
Immature Granulocytes: 0 %
Lymphocytes Relative: 17 %
Lymphs Abs: 1.2 10*3/uL (ref 0.7–4.0)
MCH: 29.9 pg (ref 26.0–34.0)
MCHC: 31.7 g/dL (ref 30.0–36.0)
MCV: 94.1 fL (ref 80.0–100.0)
Monocytes Absolute: 1 10*3/uL (ref 0.1–1.0)
Monocytes Relative: 13 %
Neutro Abs: 5 10*3/uL (ref 1.7–7.7)
Neutrophils Relative %: 68 %
Platelets: 171 10*3/uL (ref 150–400)
RBC: 3.55 MIL/uL — ABNORMAL LOW (ref 4.22–5.81)
RDW: 12.8 % (ref 11.5–15.5)
WBC: 7.4 10*3/uL (ref 4.0–10.5)
nRBC: 0 % (ref 0.0–0.2)

## 2020-02-04 LAB — MAGNESIUM: Magnesium: 2 mg/dL (ref 1.7–2.4)

## 2020-02-04 LAB — GLUCOSE, CAPILLARY
Glucose-Capillary: 61 mg/dL — ABNORMAL LOW (ref 70–99)
Glucose-Capillary: 101 mg/dL — ABNORMAL HIGH (ref 70–99)
Glucose-Capillary: 151 mg/dL — ABNORMAL HIGH (ref 70–99)
Glucose-Capillary: 160 mg/dL — ABNORMAL HIGH (ref 70–99)

## 2020-02-04 LAB — PHOSPHORUS: Phosphorus: 3.9 mg/dL (ref 2.5–4.6)

## 2020-02-04 MED ORDER — CARVEDILOL 25 MG PO TABS: 25.0000 mg | ORAL_TABLET | Freq: Two times a day (BID) | ORAL | 0 refills | Status: DC

## 2020-02-04 MED ORDER — AMIODARONE HCL 200 MG PO TABS: 200.0000 mg | ORAL_TABLET | Freq: Two times a day (BID) | ORAL | 0 refills | Status: DC

## 2020-02-04 MED ORDER — CARVEDILOL 12.5 MG PO TABS: 25.0000 mg | ORAL_TABLET | Freq: Two times a day (BID) | ORAL | Status: DC

## 2020-02-04 MED ORDER — PANTOPRAZOLE SODIUM 40 MG PO TBEC
40.0000 mg | DELAYED_RELEASE_TABLET | Freq: Two times a day (BID) | ORAL | 0 refills | Status: DC
Start: 2020-02-04 — End: 2020-03-25

## 2020-02-04 MED ORDER — INSULIN GLARGINE 100 UNIT/ML ~~LOC~~ SOLN
24.0000 [IU] | SUBCUTANEOUS | 11 refills | Status: DC
Start: 2020-02-04 — End: 2020-10-08

## 2020-02-04 MED ORDER — ACETAMINOPHEN 325 MG PO TABS
650.0000 mg | ORAL_TABLET | Freq: Four times a day (QID) | ORAL | Status: DC | PRN
  Administered 2020-02-04: 650 mg via ORAL
  Filled 2020-02-04: qty 2

## 2020-02-04 MED ORDER — HYDRALAZINE HCL 25 MG PO TABS
37.5000 mg | ORAL_TABLET | Freq: Three times a day (TID) | ORAL | 0 refills | Status: DC
Start: 2020-02-04 — End: 2020-03-18

## 2020-02-04 MED ORDER — HYDRALAZINE HCL 25 MG PO TABS
37.5000 mg | ORAL_TABLET | Freq: Three times a day (TID) | ORAL | Status: DC
  Administered 2020-02-04: 37.5 mg via ORAL
  Filled 2020-02-04: qty 2

## 2020-02-04 MED ORDER — CARVEDILOL 12.5 MG PO TABS
12.5000 mg | ORAL_TABLET | Freq: Once | ORAL | Status: AC
  Administered 2020-02-04: 12.5 mg via ORAL
  Filled 2020-02-04: qty 1

## 2020-02-04 NOTE — Progress Notes (Signed)
After recheck - blood glucose level was 101 - 400 ml of orange juice

## 2020-02-04 NOTE — Progress Notes (Signed)
Progress Note  Patient Name: Troy Rush Date of Encounter: 02/04/2020  [facility] Cardiologist: Loralie Champagne, MD   Subjective   Feeling well. No chest pain, sob or palpitations. No blood drawn this morning.   Inpatient Medications    Scheduled Meds: . amiodarone  200 mg Oral BID  . carvedilol  12.5 mg Oral BID WC  . Chlorhexidine Gluconate Cloth  6 each Topical Daily  . hydrALAZINE  25 mg Oral Q6H  . insulin aspart  0-15 Units Subcutaneous Q4H  . insulin glargine  24 Units Subcutaneous Q24H  . isosorbide mononitrate  30 mg Oral Daily  . mouth rinse  15 mL Mouth Rinse BID  . pantoprazole  40 mg Oral BID   Continuous Infusions: . sodium chloride Stopped (01/31/20 2331)  . sodium chloride 65 mL/hr at 02/04/20 0348  . famotidine (PEPCID) IV Stopped (02/03/20 2235)   PRN Meds: dextrose, hydrALAZINE, morphine injection, prochlorperazine   Vital Signs    Vitals:   02/03/20 2349 02/04/20 0000 02/04/20 0400 02/04/20 0800  BP:  (!) 153/60    Pulse:  82    Resp:  20    Temp: 98.4 F (36.9 C)  98.3 F (36.8 C) 98.8 F (37.1 C)  TempSrc: Oral  Oral Oral  SpO2:  96%    Weight:      Height:        Intake/Output Summary (Last 24 hours) at 02/04/2020 0831 Last data filed at 02/04/2020 0700 Gross per 24 hour  Intake 2059.41 ml  Output 750 ml  Net 1309.41 ml   Last 3 Weights 01/31/2020 12/17/2019 10/07/2019  Weight (lbs) 220 lb 228 lb 3.2 oz 217 lb 6.4 oz  Weight (kg) 99.791 kg 103.511 kg 98.612 kg      Telemetry    Normal sinus rhythm  - Personally Reviewed  ECG    N/A  Physical Exam   GEN: No acute distress.   Neck: No JVD Cardiac: RRR, no murmurs, rubs, or gallops.  Respiratory: Clear to auscultation bilaterally. GI: Soft, nontender, non-distended  MS: No edema; No deformity. Neuro:  Nonfocal  Psych: Normal affect   Labs    High Sensitivity Troponin:   Recent Labs  Lab 02/01/20 0520 02/01/20 0705 02/02/20 0716  TROPONINIHS 79* 393* 459*        Chemistry Recent Labs  Lab 01/31/20 2317 02/01/20 0520 02/02/20 0239 02/03/20 0305 02/03/20 1639  NA 138   < > 135 140 132*  K 5.6*   < > 3.4* 3.7 4.2  CL 94*   < > 97* 99 96*  CO2 21*   < > 23 28 25   GLUCOSE 381*   < > 176* 111* 363*  BUN 25*   < > 35* 36* 41*  CREATININE 1.86*   < > 2.83* 2.85* 2.65*  CALCIUM 9.0   < > 8.6* 9.1 8.5*  PROT 7.5  --  6.4* 6.4*  --   ALBUMIN 3.5  --  3.1* 3.2*  --   AST 39  --  25 27  --   ALT 24  --  19 17  --   ALKPHOS 59  --  46 47  --   BILITOT 2.1*  --  1.0 1.2  --   GFRNONAA 44*   < > 26* 26* 28*  ANIONGAP 23*   < > 15 13 11    < > = values in this interval not displayed.     Hematology Recent Labs  Lab 02/01/20 1705 02/02/20 0239 02/03/20 0305  WBC 8.7 8.0 6.8  RBC 4.06* 3.96* 3.81*  HGB 12.0* 11.8* 11.4*  HCT 38.7* 37.7* 37.2*  MCV 95.3 95.2 97.6  MCH 29.6 29.8 29.9  MCHC 31.0 31.3 30.6  RDW 12.8 13.1 13.2  PLT 215 190 191   Radiology    No results found.  Cardiac Studies   None this admission   Patient Profile     49 y.o. male with a hx of deafness, hypertension, type II diabetes, chronic systolic and diastolic heart failure, nonischemic cardiomyopathy s/p [employer] scientific ICDwho is being seen for the evaluation ofatrial fibrillation with rapid ventricular response in setting of upper GI bleed and DKA.   Assessment & Plan    1.  New onset atrial fibrillation with rapid ventricular rate -No prior documented atrial fibrillation. Likely in the setting of ketotic diabetic episode (without acidosis) - Previously on Eliquis indefinitively for unprovoked DVT/PE per Dr. Hyman Hopes recommendation. -Patient spontaneously converted to sinus rhythm and now maintaining. -Currently holding anticoagulation secondary to GI issue.  Per EGD note, okay to restart anticoagulation 02/04/2020. Restart pending renal function and hemoglobin check today.  -Continue Coreg 12.5 mg twice daily -Changed IV amiodarone to p.o. 200mg   BID x 7 days then 200mg  daily. Given age will no recommended long term use. Will review length with MD.   2. Chronic diastolic CHF/NICM - Lasix, Imdur and Entresto held this admission due to AKI hypotension initially, now BP running high>>  Started Imdur and hydralazine yesterday. No BP check this morning. Adjust dose based on reading.  - Continue to hold Entresto due to AKI - Digoxin level was low this admission.  ? Compliant. Continue to hold.  - - Net I  & O positive 5.9L.  - Appears euvolemic - Likely PRN lasix at discharge.    3. Acute on CKD III - Baseline Scr 1.4-1.6 - Scr peaked at 3.15 on 10/23>>>>>Scr improved to 2.65 yesterday afternoon after gentle hydration. Appreciate nephrology recommendations. Pending BMP this morning.  - Avoid nephrotoxic agent  4. DM - Per primary team  5. Gi bleed/Anemia - Per primary   For questions or updates, please contact [place] Please consult [email or web address] for contact info under        SignedLeanor Kail, PA  02/04/2020, 8:31 AM

## 2020-02-04 NOTE — Progress Notes (Signed)
Inpatient Diabetes Program Recommendations  AACE/ADA: New Consensus Statement on Inpatient Glycemic Control (2015)  Target Ranges:  Prepandial:   less than 140 mg/dL      Peak postprandial:   less than 180 mg/dL (1-2 hours)      Critically ill patients:  140 - 180 mg/dL   Lab Results  Component Value Date   GLUCAP 151 (H) 02/04/2020   HGBA1C 11.0 (H) 02/01/2020    Review of Glycemic Control Results for Troy Rush, Troy Rush (MRN [id number]) as of 02/04/2020 10:02  Ref. Range 02/03/2020 19:37 02/03/2020 23:38 02/04/2020 03:17 02/04/2020 03:33 02/04/2020 07:37  Glucose-Capillary Latest Ref Range: 70 - 99 mg/dL 357 (H) Novolog 15 units 183 (H) Novolog 3 units 61 (L) 101 (H) 151 (H)   Diabetes history: DM2 Outpatient Diabetes medications: Lantus 30 units QHS                             Invokana 300 mg Daily                             Metformin 1000 mg BID Current orders for Inpatient glycemic control: Lantus 24 units + Novolog moderate correction q 4 hrs.  Inpatient Diabetes Program Recommendations:   -Decrease Novolog correction to tid + hs 0-5 units since patient is eating Secure chat sent to Dr. Alfredia Ferguson.  Thank you, Nani Gasser. Martin Belling, RN, MSN, CDE  Diabetes Coordinator Inpatient Glycemic Control Team Team Pager [phone] (8am-5pm) 02/04/2020 10:06 AM

## 2020-02-04 NOTE — Progress Notes (Signed)
Taking blood sugar - as ordered and patient results for the 4 am round was 61- gave orange juice orally - patient is asymptomatic as of this. Will recheck after PO intake

## 2020-02-04 NOTE — Discharge Summary (Signed)
Physician Discharge Summary  Troy Rush VEL:[id number] DOB: 1971-02-26 DOA: 01/31/2020  PCP: Troy Glatter, FNP  Admit date: 01/31/2020 Discharge date: 02/04/2020  Admitted From: Home Disposition: Home  Recommendations for Outpatient Follow-up:  1. Follow up with PCP in 1-2 weeks 2. Follow up with Cardiology Dr. Aundra Dubin and [facility]; Hold Entresto and Lasix until evaluated by Cardiology  3. Follow up with Gastroenterology within 2-4 weeks and Continue PPI BID x6 weeks and then Daily 4. Please obtain CMP/CBC, Mag, Phos in one week 5. Please follow up on the following pending results:  Home Health: No  Equipment/Devices: None  Discharge Condition: Stable  CODE STATUS: FULL CODE Diet recommendation: Heart Healthy Carb Modified Diet   Brief/Interim Summary: The patient is a 49 year old Obese African-American male who is hearing impaired with a past medical history significant for not limited to nonischemic cardiomyopathy status post ICD placement, history of PE and DVT, history of diabetes mellitus type 2, deafness, history of pancreatitis, chronic combined systolic and diastolic CHF as well as other comorbidities who presented to the ED for chief complaint of nausea vomiting and hematemesis with epigastric discomfort last 24 hours. He has had multiple episodes of frank hematemesis that has lasted for last few days and he is also noted some black stools.The patient is on Eliquis for a history of PE and upon presentation to the ED he is found to be in atrial fibrillation with RVR with brief episodes of nonsustained ventricular tachycardia. He was started on amiodarone drip given his history of heart failure as diltiazem and carvedilol cannot really be used. He is found to be in DKA as well and had an elevated anion gap of 23. CT of the abdomen pelvis was done and was unremarkable. He was started on insulin drip for DKA protocol and amiodarone drip and  Pepcid given that Protonix was not able to be used given the arrhythmia  **Interim History Patient's A. fib with RVR spontaneously converted to normal Sinus Rhythm started and remained on amiodarone drip.  DKA improved and he was transitioned off of insulin drip to long-acting and is now eating.  EGD done today and showed no evidence of active bleeding and bleeding likely from esophagitis and gastritis.  GI recommending holding Eliquis until 02/04/2020  Patient is improving but did have some nausea and slight vomiting this morning but no blood in it.  02/03/20: Patient's heart rate remained stable and cardiology is going to plan on changing him to p.o. amiodarone today.  His Coreg was initiated last night.  GI recommending continuing IV PPI twice daily for 24 hours and then changing to p.o. today.  Because his renal function still remains elevated I discussed the case with [name] of Nephrology who recommends starting gentle IV fluid hydration at 65 MLS per hour for now and obtain a bladder scan and repeating a BMET this afternoon.  Patient is stable to transfer from the stepdown unit to progressive care.  Anticipating discharging home in the next 24 to 48 hours  02/04/2020 his creatinine is improving and he felt well.  Denies any nausea or vomiting or abdominal pain.  Heart rate remained stable.  Cardiology made some further recommendations and recommended holding Entresto and Lasix at discharge.  He is stable to be discharged home at this time and follow-up with PCP, cardiology, gastroenterology and he understands agrees with the plan of care.  Is much improved since the time he came in  is out of DKA and blood sugars have been relatively been well controlled.   Discharge Diagnoses:  Principal Problem:   Atrial fibrillation with RVR ([place]) Active Problems:   Deaf   NICM (nonischemic cardiomyopathy) ([place])   NSVT (nonsustained ventricular tachycardia) ([facility])   Acute GI bleeding   DKA  (diabetic ketoacidosis) ([place])   Acute kidney injury superimposed on CKD ([place])   Chronic combined systolic and diastolic heart failure ([facility])  A. fib with RVR and nonsustained V. tach, new onset, with conversion to normal sinus rhythm -Presently on amiodarone drip and cardiology transitioning off of this to p.o. today. Will cycle cardiac markers and check thyroid function test and troponins were slightly elevated in the setting of demand ischemia TSH was normal. -Patient was n.p.o. as he was having intractable nausea vomiting but this is improved -Cardiology has been notified and consulted -Not on anticoagulationand will hold his Eliquissince patient is having bleeding. -Patient's CHA2DS2-VASc score is elevated at least 3 -Cardiology consulted and recommending IV amiodarone and were holding carvedilol and digoxin for now and no diltiazem given his reduced EF  -cardiology will be adding low-dose oral metoprolol in the short-term 12.5 mg tartrate twice daily and recommending uptitrating as necessary and holding if blood pressure drops below 90 systolic but they switched back to carvedilol yesterday evening -Cardiology recommending continuing to hold apixaban and GI recommending resuming it on Tuesday 10/26 -Patient spontaneously converted to normal sinus rhythm day before yesterday and cardiology recommending continue IV amiodarone and will be transitioning to oral medication today and will be on amiodarone 200 mg p.o. twice daily -Cardiology recommending to hold digoxin for his AKI and holding diltiazem given his reduced EF -Metoprolol 12.5 mg twice daily changed to carvedilol 12.5 mg p.o. twice daily by Cardiology and this was increased to 25 twice daily daily -Cardiology feels that he may not been compliant with digoxin given that his digoxin level was low on admission commending continue to hold as above -Follow-up on further cardiology recommendations and appreciate their  recommendations  Chronic systolic and diastolic CHF Nonischemic cardiomyopathy Elevated troponin -Cardiology recommending holding his heart failure medications at this time given his AKI and borderline blood pressures and recommending continue hydration given his ketosis and elevated creatinine -He does appear dry and cardiology recommending holding Lasix, Imdur and Entresto at this time -Elevated troponin is worsening and in the setting of demand ischemia from his A. fib with RVR;troponin has gone from 79 is now 393 -> 459 -Patient is not complaining of any chest pain -Patient's BP is now improving  -Continue strict I's and O's and daily weights but this has not been documented  -Patient appears euvolemic and cardiology recommends to control Entresto due to AKI -Cardiology will be resuming his Imdur and hydralazine today and will also continue to hold Lasix given that we will start gentle IV fluid hydration from his AKI -Cardiology has increased his carvedilol to 25 mg p.o. twice daily, hydralazine 37.5 3 times daily and recommending holding Entresto and Lasix but resumed his Imdur as well -Follow-up with cardiology Dr. Algernon Huxley within 1 to 2 weeks  Acute GI bleed with hematemesis for which patient has been placed on Pepcid. -Was avoiding Protonix due to nonsustained V. tach but was then placed on Protonix twice daily given cardiac clearance -Type and screen and follow CBCs -He is medically optimized and underwent EGD yesterday -Gastroenterology recommending PPI and he did get IV pantoprazole 40 mg x 1 and was changed to 40 every  12 -Currently on IV famotidine 20 mg q. 12 but will discuss with cardiology about switching back to Protonixand they are ok with resuming IV PPI and he received 40 every 12 and will continue it for a total 24 hours and should be changed to p.o. a day -We will need to be on twice daily PPI p.o. 40 mg for 6 weeks and then 40 mg daily indefinitely -Follow-up with  GI in outpatient setting  Abdominal painwith nausea vomiting and hematemesis, improved -Gastroenterologyconsulted. Lipase mildly elevated CT abdomen pelvis is unremarkable. -Could be from peptic ulcer disease. Follow lipase and LFTs. -Presently n.p.o. -Currently on IV famotidine and received IV PPI once; IV PPI every 12 was added and will continue for another 24 hours per GI recommendations -Continue supportive care and antiemetics at home have started IV Zofran and IV Compazine -IV fluids have now stopped -EGD done today and showed LA grade B esophagitis, gastritis as well as a normal duodenal bulb in the fourth portion of the duodenum and second portion of the duodenum.  Bleeding was likely from esophagitis and gastritis there is no endoscopic findings just high risk for rebleeding -GI recommending continue IV PPI twice daily for another 24 hours and then transitioning to pantoprazole 40 mg p.o. twice daily for 6 weeks and then pantoprazole 40 mill daily daily thereafter indefinitely and will need to follow-up with GI in outpatient setting -GI recommending holding Eliquis in a few days and okay to resume on Tuesday 10/26  Diabetic ketoacidosis in the setting of SLGT-2 Inhibitor usage, improved and resolved -could be precipitated from nausea vomiting. Presently on IV insulin drip and was transitioned the night before last to 24 units of Lantus and will continue home medications and hold his Metformin -follow metabolic panel closely. -CBGs have improved -His D5 hours can be stopped and will resume NS at 65 mL/hr  and will continue until discharged -CO2 is improved and last CO2 was 28, anion gap was 13, chloride level of 99; on admission his anion gap was 23  -CBGs ranged from 61-160 -He is now on moderate NovoLog sliding scale every 4 hours -Patient's beta hydroxybutyrate on admission was 4.82 and trended down to 0.73 now -Patient is now on a soft carb modified diet  Acute renal  failure on chronic kidney disease stage IIIbwith hyperkalemia probably precipitated from vomiting and dehydration.  Hyperphosphatemia  -BUNs/creatinine is worsening and went from 25/1.86 and trended up to 31/2.31 and is now 31/2.59 -> 36/3.01 -> 35/2.83 -> 36/2.85 -> 32/2.24 is improving -Hold his nephrotoxic medications and avoid contrast dyes and hypotension -Hyperkalemia has improved and now is actually hypokalemic and is 3.4 and after repletion is 3.7 -Renally adjust medications and continue to monitor renal function carefully and continue with supportive care  -Baseline is around 1.4-1.7 -Had stopped IV fluid hydration yesterday given that he was eating I spoke with nephrology given his lack of improvement in his renal function and [name] recommends resuming gentle IV fluid at 65 MLS per hour and then checking a bladder scan and repeating a BMP later this afternoon -Continue monitor and trend and repeat CMP in outpatient setting -I spoke with cardiology and the recommending holding Entresto and Lasix for now and having follow-up with CMP within a week.  He is stable to be discharged  Hyperbilirubinemia -Patient's T bili was elevated on admission 2.1 and trended down and normalized but slightly bumped and now 1.8-continue monitor trend in outpatient setting likely reactive  History of  PE -holding of anticoagulation due to acute GI bleed. GI recommending holding until 02/04/20 this will be done this evening  Acquired Thrombophilia -In the setting of his paroxysmal atrial fibrillation -Patient's CHA2DS2-VASc is 3 -Resume anticoagulation on 02/04/2020 per GI recommendation and this was done this evening  History of deafness. I used the interpreter [id number] #[id number] on the day of admission patient to communicate but I used writing today and patient responded using a writing pad without the use of an interpreter  NormocyticAnemia -In the setting of his GI bleeding and vomiting  hematemesis -Patient's hemoglobin/hematocrit went from 13.2/41.8 on admission is trended all the way down to 11.9/30.1 and likely partially in the setting of dilutional drop -Patient's hemoglobin/hematocrit is now slightly lower at 11.4/37.2 yesterday and today was 10.6/33.4 with no active bleeding or hematemesis anticoagulation is off. -IV fluids have stopped but resumed with AKI and now will discontinue at discharge. -Continue to monitor for further signs and symptoms of bleeding; currently no overt bleeding noted now -Repeat CBC in a.m.  Obesity -Complicates overall prognosis and care -Estimated body mass index is 31.57 kg/m as calculated from the following:   Height as of this encounter: 5\' 10"  (1.778 m).   Weight as of this encounter: 99.8 kg. -Weight Loss and Dietary Counseling given  -Follow-up with PCP in the outpatient setting  Discharge Instructions  Discharge Instructions    (HEART FAILURE PATIENTS) Call MD:  Anytime you have any of the following symptoms: 1) 3 pound weight gain in 24 hours or 5 pounds in 1 week 2) shortness of breath, with or without a dry hacking cough 3) swelling in the hands, feet or stomach 4) if you have to sleep on extra pillows at night in order to breathe.   Complete by: As directed    Call MD for:  difficulty breathing, headache or visual disturbances   Complete by: As directed    Call MD for:  extreme fatigue   Complete by: As directed    Call MD for:  hives   Complete by: As directed    Call MD for:  persistant dizziness or light-headedness   Complete by: As directed    Call MD for:  persistant nausea and vomiting   Complete by: As directed    Call MD for:  redness, tenderness, or signs of infection (pain, swelling, redness, odor or green/yellow discharge around incision site)   Complete by: As directed    Call MD for:  severe uncontrolled pain   Complete by: As directed    Diet - low sodium heart healthy   Complete by: As directed    Diet  Carb Modified   Complete by: As directed    Discharge instructions   Complete by: As directed    You were cared for by a hospitalist during your hospital stay. If you have any questions about your discharge medications or the care you received while you were in the hospital after you are discharged, you can call the unit and ask to speak with the hospitalist on call if the hospitalist that took care of you is not available. Once you are discharged, your primary care physician will handle any further medical issues. Please note that NO REFILLS for any discharge medications will be authorized once you are discharged, as it is imperative that you return to your primary care physician (or establish a relationship with a primary care physician if you do not have one) for your aftercare needs so that they  can reassess your need for medications and monitor your lab values.  Follow up with PCP, Cardiology, and Gastroenterology as an outpatient within 1-2 weeks. Take all medications as prescribed. If symptoms change or worsen please return to the ED for evaluation   Increase activity slowly   Complete by: As directed      Allergies as of 02/04/2020      Reactions   Lisinopril Cough      Medication List    STOP taking these medications   digoxin 0.125 MG tablet Commonly known as: LANOXIN   Entresto 24-26 MG Generic drug: sacubitril-valsartan   furosemide 40 MG tablet Commonly known as: LASIX   insulin glargine 100 UNIT/ML Solostar Pen Commonly known as: Lantus SoloStar Replaced by: insulin glargine 100 UNIT/ML injection   Invokana 300 MG Tabs tablet Generic drug: canagliflozin   metFORMIN 1000 MG tablet Commonly known as: GLUCOPHAGE     TAKE these medications   amiodarone 200 MG tablet Commonly known as: PACERONE Take 1 tablet (200 mg total) by mouth 2 (two) times daily for 7 days.   apixaban 5 MG Tabs tablet Commonly known as: Eliquis Take 1 tablet (5 mg total) by mouth 2 (two)  times daily.   atorvastatin 80 MG tablet Commonly known as: LIPITOR TAKE 1 TABLET(80 MG) BY MOUTH DAILY What changed: See the new instructions.   carvedilol 25 MG tablet Commonly known as: COREG Take 1 tablet (25 mg total) by mouth 2 (two) times daily with a meal. What changed:   medication strength  how much to take  when to take this   citalopram 40 MG tablet Commonly known as: CELEXA Take 40 mg by mouth daily as needed (depression).   fenofibrate 54 MG tablet Take 1 tablet (54 mg total) by mouth daily.   hydrALAZINE 25 MG tablet Commonly known as: APRESOLINE Take 1.5 tablets (37.5 mg total) by mouth every 8 (eight) hours.   icosapent Ethyl 1 g capsule Commonly known as: VASCEPA Take 2 capsules (2 g total) by mouth 2 (two) times daily.   insulin glargine 100 UNIT/ML injection Commonly known as: LANTUS Inject 0.24 mLs (24 Units total) into the skin daily. Replaces: insulin glargine 100 UNIT/ML Solostar Pen   isosorbide mononitrate 30 MG 24 hr tablet Commonly known as: IMDUR Take 1 tablet (30 mg total) by mouth daily.   latanoprost 0.005 % ophthalmic solution Commonly known as: XALATAN Place 1 drop into both eyes at bedtime.   pantoprazole 40 MG tablet Commonly known as: PROTONIX Take 1 tablet (40 mg total) by mouth 2 (two) times daily.   topiramate 50 MG tablet Commonly known as: TOPAMAX TAKE 1 TABLET BY MOUTH TWICE DAILY AS NEEDED FOR HEADACHE What changed:   how much to take  how to take this  when to take this  reasons to take this  additional instructions   traZODone 100 MG tablet Commonly known as: DESYREL Take 1 tablet (100 mg total) by mouth at bedtime.   Vitamin D (Ergocalciferol) 1.25 MG (50000 UNIT) Caps capsule Commonly known as: DRISDOL Take 1 capsule (50,000 Units total) by mouth every 7 (seven) days. What changed: when to take this       Allergies  Allergen Reactions  . Lisinopril Cough    Consultations:  Cardiology  Gastroenterology  Procedures/Studies: CT Abdomen Pelvis Wo Contrast  Result Date: 02/01/2020 CLINICAL DATA:  Abdominal pain and vomiting EXAM: CT ABDOMEN AND PELVIS WITHOUT CONTRAST TECHNIQUE: Multidetector CT imaging of the abdomen and pelvis  was performed following the standard protocol without IV contrast. COMPARISON:  Aug 19, 2019 FINDINGS: Lower chest: There is mild cardiomegaly present. No hiatal hernia. The visualized portions of the lungs are clear. Hepatobiliary: Although limited due to the lack of intravenous contrast, normal in appearance without gross focal abnormality. No evidence of calcified gallstones or biliary ductal dilatation. Pancreas:  Unremarkable.  No surrounding inflammatory changes. Spleen: Normal in size. Although limited due to the lack of intravenous contrast, normal in appearance. Adrenals/Urinary Tract: Both adrenal glands appear normal. The kidneys and collecting system appear normal without evidence of urinary tract calculus or hydronephrosis. Bladder is unremarkable. Stomach/Bowel: The stomach, small bowel, and colon are normal in appearance. No inflammatory changes or obstructive findings. A moderate amount of colonic stool is present. Appendix is normal. Vascular/Lymphatic: There are no enlarged abdominal or pelvic lymph nodes. Scattered mild aortic atherosclerotic calcifications are seen without aneurysmal dilatation. Reproductive: The prostate is unremarkable. Other: No evidence of abdominal wall mass or hernia. Musculoskeletal: No acute or significant osseous findings. IMPRESSION: No acute intra-abdominal or pelvic pathology to explain the patient's symptoms. Moderate amount of colonic stool Aortic Atherosclerosis (ICD10-I70.0). Electronically Signed   By: Prudencio Pair M.D.   On: 02/01/2020 02:37   US RENAL  Result Date: 02/01/2020 CLINICAL DATA:  49 year old male with acute on chronic kidney injury. EXAM: RENAL / URINARY TRACT  ULTRASOUND COMPLETE COMPARISON:  Prior CTs FINDINGS: Right Kidney: Renal measurements: 11.6 x 5.3 x 6.0 cm = volume: 491 mL. Echogenicity within normal limits. No mass or hydronephrosis visualized. Left Kidney: Renal measurements: 12.4 x 8.5 x 8.8 cm = volume: 791 mL. Echogenicity within normal limits. No mass or hydronephrosis visualized. Bladder: Appears normal for degree of bladder distention. Other: None. IMPRESSION: Unremarkable renal ultrasound. Electronically Signed   By: Margarette Canada M.D.   On: 02/01/2020 16:30   DG ABD ACUTE 2+V W 1V CHEST  Result Date: 01/31/2020 CLINICAL DATA:  Abdominal pain and vomiting starting today. EXAM: DG ABDOMEN ACUTE WITH 1 VIEW CHEST COMPARISON:  Chest 08/18/2019.  CT abdomen and pelvis 08/19/2019 FINDINGS: Cardiac pacemaker. Shallow inspiration. Heart size and pulmonary vascularity are normal for technique. Lungs are clear. No pleural effusions. No pneumothorax. Scattered gas and stool throughout the colon. No small or large bowel distention. No free intra-abdominal air. No abnormal air-fluid levels. No radiopaque stones. Calcified phleboliths in the pelvis. Visualized bones and soft tissue contours are normal. IMPRESSION: No evidence of active pulmonary disease. Nonobstructive bowel gas pattern. Electronically Signed   By: Lucienne Capers M.D.   On: 01/31/2020 23:52    EGD Findings: LA Grade B (one or more mucosal breaks greater than 5 mm, not extending  between the tops of two mucosal folds) esophagitis was found. The exam of the esophagus was otherwise normal. Patchy moderate inflammation was found in the cardia, in the gastric  fundus and in the gastric body. The exam of the stomach was otherwise normal. The duodenal bulb, first portion of the duodenum and second portion of  the duodenum were normal. No old or fresh blood was seen to the extent of our examination. Impression: - LA Grade B  esophagitis. - Gastritis. - Normal duodenal bulb, first portion of the  duodenum and second portion of the duodenum. - Bleeding likely from esophagitis and gastritis,  but no endoscopic findings to suggest high risk for  rebleeding. Moderate Sedation: None Recommendation: - Return patient to ICU for ongoing care. - Soft diet today. - Continue present medications. -  IV PPI BID x 24 hours, then transition to  pantoprazole 40 mg po bid x 6 weeks, then  pantoprazole 40 mg po qd thereafter indefinitely. - Hold Eliquis for another couple days, OK to  resume Tuesday 10/26. - [facility] will sign-off; we can arrange outpatient  follow-up with [place]; please call with any questions;  thank you for the consultation.  Subjective: Seen and examined at bedside and he communicated to me via writing he states that he is feeling good and had no complaints of abdominal pain.  No nausea or vomiting.  Was euvolemic and prior to discharge he had a slight headache which abated by Tylenol.  He felt well and was ready to go home and is no longer in DKA and heart rate was normal.  He will need to follow-up with PCP, cardiology, gastroenterology and he understands agrees with the plan of care and understands he needs to hold his Lasix as well as his Entresto and we have discontinued his Metformin given his renal function.  Discharge Exam: Vitals:   02/04/20 0845 02/04/20 1000  BP: (!) 181/80 (!) 157/101  Pulse: 80 74  Resp: 19 16  Temp:    SpO2: 100% 98%   Vitals:   02/04/20 0400 02/04/20  0800 02/04/20 0845 02/04/20 1000  BP:   (!) 181/80 (!) 157/101  Pulse:   80 74  Resp:   19 16  Temp: 98.3 F (36.8 C) 98.8 F (37.1 C)    TempSrc: Oral Oral    SpO2:   100% 98%  Weight:      Height:       General: Pt is alert, awake, not in acute distress; he is deaf and communicated via writing Cardiovascular: RRR, S1/S2 +, no rubs, no gallops Respiratory: Diminished bilaterally, no wheezing, no rhonchi; unlabored breathing and not wearing any supplemental oxygen via nasal cannula Abdominal: Soft, NT, distended secondary by habitus, bowel sounds + Extremities: Very minimal edema, no cyanosis  The results of significant diagnostics from this hospitalization (including imaging, microbiology, ancillary and laboratory) are listed below for reference.     Microbiology: Recent Results (from the past 240 hour(s))  Respiratory Panel by RT PCR (Flu A&B, Covid) - Nasopharyngeal Swab     Status: None   Collection Time: 02/01/20  1:38 AM   Specimen: Nasopharyngeal Swab  Result Value Ref Range Status   SARS Coronavirus 2 by RT PCR NEGATIVE NEGATIVE Final    Comment: (NOTE) SARS-CoV-2 target nucleic acids are NOT DETECTED.  The SARS-CoV-2 RNA is generally detectable in upper respiratoy specimens during the acute phase of infection. The lowest concentration of SARS-CoV-2 viral copies this assay can detect is 131 copies/mL. A negative result does not preclude SARS-Cov-2 infection and should not be used as the sole basis for treatment or other patient management decisions. A negative result may occur with  improper specimen collection/handling, submission of specimen other than nasopharyngeal swab, presence of viral mutation(s) within the areas targeted by this assay, and inadequate number of viral copies (<131 copies/mL). A negative result must be combined with clinical observations, patient history, and epidemiological information. The expected result is Negative.  Fact Sheet for  Patients:  PinkCheek.be  Fact Sheet for Healthcare Providers:  GravelBags.it  This test is no t yet approved or cleared by the [place] FDA and  has been authorized for detection and/or diagnosis of SARS-CoV-2 by FDA under an Emergency Use Authorization (EUA). This EUA will remain  in effect (meaning this test  can be used) for the duration of the COVID-19 declaration under Section 564(b)(1) of the Act, 21 U.S.C. section 360bbb-3(b)(1), unless the authorization is terminated or revoked sooner.     Influenza A by PCR NEGATIVE NEGATIVE Final   Influenza B by PCR NEGATIVE NEGATIVE Final    Comment: (NOTE) The Xpert Xpress SARS-CoV-2/FLU/RSV assay is intended as an aid in  the diagnosis of influenza from Nasopharyngeal swab specimens and  should not be used as a sole basis for treatment. Nasal washings and  aspirates are unacceptable for Xpert Xpress SARS-CoV-2/FLU/RSV  testing.  Fact Sheet for Patients: PinkCheek.be  Fact Sheet for Healthcare Providers: GravelBags.it  This test is not yet approved or cleared by the [place] FDA and  has been authorized for detection and/or diagnosis of SARS-CoV-2 by  FDA under an Emergency Use Authorization (EUA). This EUA will remain  in effect (meaning this test can be used) for the duration of the  Covid-19 declaration under Section 564(b)(1) of the Act, 21  U.S.C. section 360bbb-3(b)(1), unless the authorization is  terminated or revoked. Performed at [facility], [place] [address], [place], [place] [address]   MRSA PCR Screening     Status: None   Collection Time: 02/01/20  8:20 PM   Specimen: Nasopharyngeal  Result Value Ref Range Status   MRSA by PCR NEGATIVE NEGATIVE Final    Comment:        The GeneXpert MRSA Assay (FDA approved for NASAL specimens only), is one component of a comprehensive  MRSA colonization surveillance program. It is not intended to diagnose MRSA infection nor to guide or monitor treatment for MRSA infections. Performed at [facility], [place] [address], [place], [place] [address]      Labs: BNP (last 3 results) Recent Labs    07/12/19 1308  BNP 202.5*   Basic Metabolic Panel: Recent Labs  Lab 01/31/20 2317 02/01/20 0520 02/01/20 2101 02/02/20 0239 02/03/20 0305 02/03/20 1639 02/04/20 0822  NA 138   < > 135 135 140 132* 136  K 5.6*   < > 3.5 3.4* 3.7 4.2 3.9  CL 94*   < > 96* 97* 99 96* 101  CO2 21*   < > 26 23 28 25 25   GLUCOSE 381*   < > 238* 176* 111* 363* 150*  BUN 25*   < > 36* 35* 36* 41* 32*  CREATININE 1.86*   < > 3.01* 2.83* 2.85* 2.65* 2.24*  CALCIUM 9.0   < > 8.9 8.6* 9.1 8.5* 8.9  MG 1.8  --   --  1.9 1.8  --  2.0  PHOS  --   --   --  4.0 5.0*  --  3.9   < > = values in this interval not displayed.   Liver Function Tests: Recent Labs  Lab 01/31/20 2317 02/02/20 0239 02/03/20 0305 02/04/20 0822  AST 39 25 27 25   ALT 24 19 17 16   ALKPHOS 59 46 47 48  BILITOT 2.1* 1.0 1.2 1.3*  PROT 7.5 6.4* 6.4* 6.8  ALBUMIN 3.5 3.1* 3.2* 3.1*   Recent Labs  Lab 01/31/20 2317  LIPASE 73*   No results for input(s): AMMONIA in the last 168 hours. CBC: Recent Labs  Lab 01/31/20 2217 02/01/20 0520 02/01/20 1305 02/01/20 1705 02/02/20 0239 02/03/20 0305 02/04/20 0822  WBC 4.9   < > 9.0 8.7 8.0 6.8 7.4  NEUTROABS 3.8  --   --   --  5.8 4.8  5.0  HGB 13.2   < > 11.9* 12.0* 11.8* 11.4* 10.6*  HCT 41.8   < > 38.1* 38.7* 37.7* 37.2* 33.4*  MCV 93.7   < > 95.0 95.3 95.2 97.6 94.1  PLT 270   < > 256 215 190 191 171   < > = values in this interval not displayed.   Cardiac Enzymes: No results for input(s): CKTOTAL, CKMB, CKMBINDEX, TROPONINI in the last 168 hours. BNP: Invalid input(s): POCBNP CBG: Recent Labs  Lab 02/03/20 1937 02/03/20 2338 02/04/20 0317 02/04/20 0333 02/04/20 0737  GLUCAP 357* 183*  61* 101* 151*   D-Dimer No results for input(s): DDIMER in the last 72 hours. Hgb A1c No results for input(s): HGBA1C in the last 72 hours. Lipid Profile No results for input(s): CHOL, HDL, LDLCALC, TRIG, CHOLHDL, LDLDIRECT in the last 72 hours. Thyroid function studies No results for input(s): TSH, T4TOTAL, T3FREE, THYROIDAB in the last 72 hours.  Invalid input(s): FREET3 Anemia work up No results for input(s): VITAMINB12, FOLATE, FERRITIN, TIBC, IRON, RETICCTPCT in the last 72 hours. Urinalysis    Component Value Date/Time   COLORURINE YELLOW 02/01/2020 [place] 02/01/2020 0137   LABSPEC 1.030 02/01/2020 0137   PHURINE 5.0 02/01/2020 0137   GLUCOSEU >=500 (A) 02/01/2020 0137   HGBUR MODERATE (A) 02/01/2020 0137   BILIRUBINUR NEGATIVE 02/01/2020 0137   BILIRUBINUR Negative 08/13/2019 1103   KETONESUR 20 (A) 02/01/2020 0137   PROTEINUR >=300 (A) 02/01/2020 0137   UROBILINOGEN 0.2 08/13/2019 1103   UROBILINOGEN 0.2 05/09/2017 1130   NITRITE NEGATIVE 02/01/2020 0137   LEUKOCYTESUR NEGATIVE 02/01/2020 0137   Sepsis Labs Invalid input(s): PROCALCITONIN,  WBC,  LACTICIDVEN Microbiology Recent Results (from the past 240 hour(s))  Respiratory Panel by RT PCR (Flu A&B, Covid) - Nasopharyngeal Swab     Status: None   Collection Time: 02/01/20  1:38 AM   Specimen: Nasopharyngeal Swab  Result Value Ref Range Status   SARS Coronavirus 2 by RT PCR NEGATIVE NEGATIVE Final    Comment: (NOTE) SARS-CoV-2 target nucleic acids are NOT DETECTED.  The SARS-CoV-2 RNA is generally detectable in upper respiratoy specimens during the acute phase of infection. The lowest concentration of SARS-CoV-2 viral copies this assay can detect is 131 copies/mL. A negative result does not preclude SARS-Cov-2 infection and should not be used as the sole basis for treatment or other patient management decisions. A negative result may occur with  improper specimen collection/handling,  submission of specimen other than nasopharyngeal swab, presence of viral mutation(s) within the areas targeted by this assay, and inadequate number of viral copies (<131 copies/mL). A negative result must be combined with clinical observations, patient history, and epidemiological information. The expected result is Negative.  Fact Sheet for Patients:  PinkCheek.be  Fact Sheet for Healthcare Providers:  GravelBags.it  This test is no t yet approved or cleared by the [place] FDA and  has been authorized for detection and/or diagnosis of SARS-CoV-2 by FDA under an Emergency Use Authorization (EUA). This EUA will remain  in effect (meaning this test can be used) for the duration of the COVID-19 declaration under Section 564(b)(1) of the Act, 21 U.S.C. section 360bbb-3(b)(1), unless the authorization is terminated or revoked sooner.     Influenza A by PCR NEGATIVE NEGATIVE Final   Influenza B by PCR NEGATIVE NEGATIVE Final    Comment: (NOTE) The Xpert Xpress SARS-CoV-2/FLU/RSV assay is intended as an aid in  the diagnosis of influenza from Nasopharyngeal swab  specimens and  should not be used as a sole basis for treatment. Nasal washings and  aspirates are unacceptable for Xpert Xpress SARS-CoV-2/FLU/RSV  testing.  Fact Sheet for Patients: PinkCheek.be  Fact Sheet for Healthcare Providers: GravelBags.it  This test is not yet approved or cleared by the [place] FDA and  has been authorized for detection and/or diagnosis of SARS-CoV-2 by  FDA under an Emergency Use Authorization (EUA). This EUA will remain  in effect (meaning this test can be used) for the duration of the  Covid-19 declaration under Section 564(b)(1) of the Act, 21  U.S.C. section 360bbb-3(b)(1), unless the authorization is  terminated or revoked. Performed at [facility], [place] [address], [place], [place] [address]   MRSA PCR Screening     Status: None   Collection Time: 02/01/20  8:20 PM   Specimen: Nasopharyngeal  Result Value Ref Range Status   MRSA by PCR NEGATIVE NEGATIVE Final    Comment:        The GeneXpert MRSA Assay (FDA approved for NASAL specimens only), is one component of a comprehensive MRSA colonization surveillance program. It is not intended to diagnose MRSA infection nor to guide or monitor treatment for MRSA infections. Performed at [facility], [place] [address] [place], [place] [address]     Time coordinating discharge: Over 30 minutes  SIGNED:  Kerney Elbe, DO [facility] 02/04/2020, 12:01 PM Pager is on [place]  If 7PM-7AM, please contact night-coverage [email or web address]

## 2020-02-05 ENCOUNTER — Emergency Department (HOSPITAL_COMMUNITY): Payer: Medicare Other

## 2020-02-05 ENCOUNTER — Inpatient Hospital Stay (HOSPITAL_COMMUNITY)
Admission: EM | Admit: 2020-02-05 | Discharge: 2020-02-08 | DRG: 438 | Disposition: A | Discharge status: Home / Self Care | Payer: Medicare Other | Attending: Internal Medicine | Admitting: Internal Medicine

## 2020-02-05 ENCOUNTER — Encounter (HOSPITAL_COMMUNITY): Payer: Self-pay

## 2020-02-05 DIAGNOSIS — I428 Other cardiomyopathies: Secondary | ICD-10-CM | POA: Diagnosis present

## 2020-02-05 DIAGNOSIS — Z86718 Personal history of other venous thrombosis and embolism: Secondary | ICD-10-CM | POA: Diagnosis not present

## 2020-02-05 DIAGNOSIS — N1832 Chronic kidney disease, stage 3b: Secondary | ICD-10-CM | POA: Diagnosis present

## 2020-02-05 DIAGNOSIS — I2694 Multiple subsegmental pulmonary emboli without acute cor pulmonale: Principal | ICD-10-CM | POA: Diagnosis present

## 2020-02-05 DIAGNOSIS — Z8249 Family history of ischemic heart disease and other diseases of the circulatory system: Secondary | ICD-10-CM

## 2020-02-05 DIAGNOSIS — R079 Chest pain, unspecified: Secondary | ICD-10-CM | POA: Diagnosis not present

## 2020-02-05 DIAGNOSIS — F32A Depression, unspecified: Secondary | ICD-10-CM | POA: Diagnosis present

## 2020-02-05 DIAGNOSIS — I5042 Chronic combined systolic (congestive) and diastolic (congestive) heart failure: Secondary | ICD-10-CM | POA: Diagnosis present

## 2020-02-05 DIAGNOSIS — I2693 Single subsegmental pulmonary embolism without acute cor pulmonale: Secondary | ICD-10-CM | POA: Diagnosis not present

## 2020-02-05 DIAGNOSIS — E1110 Type 2 diabetes mellitus with ketoacidosis without coma: Secondary | ICD-10-CM | POA: Diagnosis present

## 2020-02-05 DIAGNOSIS — R140 Abdominal distension (gaseous): Secondary | ICD-10-CM | POA: Diagnosis not present

## 2020-02-05 DIAGNOSIS — I472 Ventricular tachycardia: Secondary | ICD-10-CM | POA: Diagnosis present

## 2020-02-05 DIAGNOSIS — Z95810 Presence of automatic (implantable) cardiac defibrillator: Secondary | ICD-10-CM | POA: Diagnosis not present

## 2020-02-05 DIAGNOSIS — I2699 Other pulmonary embolism without acute cor pulmonale: Secondary | ICD-10-CM | POA: Diagnosis not present

## 2020-02-05 DIAGNOSIS — G4700 Insomnia, unspecified: Secondary | ICD-10-CM | POA: Diagnosis present

## 2020-02-05 DIAGNOSIS — E785 Hyperlipidemia, unspecified: Secondary | ICD-10-CM | POA: Diagnosis present

## 2020-02-05 DIAGNOSIS — H9193 Unspecified hearing loss, bilateral: Secondary | ICD-10-CM | POA: Diagnosis present

## 2020-02-05 DIAGNOSIS — J9811 Atelectasis: Secondary | ICD-10-CM | POA: Diagnosis not present

## 2020-02-05 DIAGNOSIS — R109 Unspecified abdominal pain: Secondary | ICD-10-CM | POA: Diagnosis not present

## 2020-02-05 DIAGNOSIS — Z79899 Other long term (current) drug therapy: Secondary | ICD-10-CM

## 2020-02-05 DIAGNOSIS — I480 Paroxysmal atrial fibrillation: Secondary | ICD-10-CM | POA: Diagnosis present

## 2020-02-05 DIAGNOSIS — I10 Essential (primary) hypertension: Secondary | ICD-10-CM | POA: Diagnosis not present

## 2020-02-05 DIAGNOSIS — K861 Other chronic pancreatitis: Secondary | ICD-10-CM | POA: Diagnosis present

## 2020-02-05 DIAGNOSIS — Z888 Allergy status to other drugs, medicaments and biological substances status: Secondary | ICD-10-CM

## 2020-02-05 DIAGNOSIS — K5641 Fecal impaction: Secondary | ICD-10-CM | POA: Diagnosis not present

## 2020-02-05 DIAGNOSIS — K859 Acute pancreatitis without necrosis or infection, unspecified: Principal | ICD-10-CM | POA: Diagnosis present

## 2020-02-05 DIAGNOSIS — E1122 Type 2 diabetes mellitus with diabetic chronic kidney disease: Secondary | ICD-10-CM | POA: Diagnosis present

## 2020-02-05 DIAGNOSIS — I169 Hypertensive crisis, unspecified: Secondary | ICD-10-CM | POA: Diagnosis present

## 2020-02-05 DIAGNOSIS — E559 Vitamin D deficiency, unspecified: Secondary | ICD-10-CM | POA: Diagnosis present

## 2020-02-05 DIAGNOSIS — Z20822 Contact with and (suspected) exposure to covid-19: Secondary | ICD-10-CM | POA: Diagnosis present

## 2020-02-05 DIAGNOSIS — Z7901 Long term (current) use of anticoagulants: Secondary | ICD-10-CM | POA: Diagnosis not present

## 2020-02-05 DIAGNOSIS — I517 Cardiomegaly: Secondary | ICD-10-CM | POA: Diagnosis not present

## 2020-02-05 LAB — CBC WITH DIFFERENTIAL/PLATELET
Abs Immature Granulocytes: 0.05 10*3/uL (ref 0.00–0.07)
Basophils Absolute: 0 10*3/uL (ref 0.0–0.1)
Basophils Relative: 1 %
Eosinophils Absolute: 0.1 10*3/uL (ref 0.0–0.5)
Eosinophils Relative: 2 %
HCT: 30.6 % — ABNORMAL LOW (ref 39.0–52.0)
Hemoglobin: 9.5 g/dL — ABNORMAL LOW (ref 13.0–17.0)
Immature Granulocytes: 1 %
Lymphocytes Relative: 17 %
Lymphs Abs: 0.9 10*3/uL (ref 0.7–4.0)
MCH: 29.6 pg (ref 26.0–34.0)
MCHC: 31 g/dL (ref 30.0–36.0)
MCV: 95.3 fL (ref 80.0–100.0)
Monocytes Absolute: 0.7 10*3/uL (ref 0.1–1.0)
Monocytes Relative: 14 %
Neutro Abs: 3.4 10*3/uL (ref 1.7–7.7)
Neutrophils Relative %: 65 %
Platelets: 179 10*3/uL (ref 150–400)
RBC: 3.21 MIL/uL — ABNORMAL LOW (ref 4.22–5.81)
RDW: 12.3 % (ref 11.5–15.5)
WBC: 5.3 10*3/uL (ref 4.0–10.5)
nRBC: 0 % (ref 0.0–0.2)

## 2020-02-05 LAB — COMPREHENSIVE METABOLIC PANEL
ALT: 16 U/L (ref 0–44)
AST: 24 U/L (ref 15–41)
Albumin: 3.5 g/dL (ref 3.5–5.0)
Alkaline Phosphatase: 50 U/L (ref 38–126)
Anion gap: 15 (ref 5–15)
BUN: 24 mg/dL — ABNORMAL HIGH (ref 6–20)
CO2: 25 mmol/L (ref 22–32)
Calcium: 9.2 mg/dL (ref 8.9–10.3)
Chloride: 102 mmol/L (ref 98–111)
Creatinine, Ser: 1.97 mg/dL — ABNORMAL HIGH (ref 0.61–1.24)
GFR, Estimated: 41 mL/min — ABNORMAL LOW (ref >60)
Glucose, Bld: 298 mg/dL — ABNORMAL HIGH (ref 70–99)
Potassium: 4.7 mmol/L (ref 3.5–5.1)
Sodium: 142 mmol/L (ref 135–145)
Total Bilirubin: 1 mg/dL (ref 0.3–1.2)
Total Protein: 7.5 g/dL (ref 6.5–8.1)

## 2020-02-05 LAB — RESPIRATORY PANEL BY RT PCR (FLU A&B, COVID)
Influenza A by PCR: NEGATIVE
Influenza B by PCR: NEGATIVE
SARS Coronavirus 2 by RT PCR: NEGATIVE

## 2020-02-05 LAB — URINALYSIS, ROUTINE W REFLEX MICROSCOPIC
Bacteria, UA: NONE SEEN
Bilirubin Urine: NEGATIVE
Glucose, UA: >=500 mg/dL — AB
Ketones, ur: NEGATIVE mg/dL
Leukocytes,Ua: NEGATIVE
Nitrite: NEGATIVE
Protein, ur: 100 mg/dL — AB
Specific Gravity, Urine: 1.01 (ref 1.005–1.030)
pH: 7 (ref 5.0–8.0)

## 2020-02-05 LAB — LIPASE, BLOOD: Lipase: 279 U/L — ABNORMAL HIGH (ref 11–51)

## 2020-02-05 LAB — TROPONIN I (HIGH SENSITIVITY)
Troponin I (High Sensitivity): 65 ng/L — ABNORMAL HIGH (ref <18)
Troponin I (High Sensitivity): 66 ng/L — ABNORMAL HIGH (ref <18)

## 2020-02-05 LAB — CBG MONITORING, ED: Glucose-Capillary: 346 mg/dL — ABNORMAL HIGH (ref 70–99)

## 2020-02-05 LAB — POC OCCULT BLOOD, ED: Fecal Occult Bld: NEGATIVE

## 2020-02-05 MED ORDER — LACTATED RINGERS IV BOLUS
500.0000 mL | Freq: Once | INTRAVENOUS | Status: AC
  Administered 2020-02-05: 500 mL via INTRAVENOUS

## 2020-02-05 MED ORDER — PANTOPRAZOLE SODIUM 40 MG IV SOLR
40.0000 mg | Freq: Once | INTRAVENOUS | Status: AC
  Administered 2020-02-05: 40 mg via INTRAVENOUS
  Filled 2020-02-05: qty 40

## 2020-02-05 MED ORDER — MORPHINE SULFATE (PF) 4 MG/ML IV SOLN
4.0000 mg | Freq: Once | INTRAVENOUS | Status: AC
  Administered 2020-02-05: 4 mg via INTRAVENOUS
  Filled 2020-02-05: qty 1

## 2020-02-05 MED ORDER — IOHEXOL 350 MG/ML SOLN
75.0000 mL | Freq: Once | INTRAVENOUS | Status: AC | PRN
  Administered 2020-02-05: 75 mL via INTRAVENOUS

## 2020-02-05 MED ORDER — FENTANYL CITRATE (PF) 100 MCG/2ML IJ SOLN
50.0000 ug | Freq: Once | INTRAMUSCULAR | Status: AC
  Administered 2020-02-05: 50 ug via INTRAVENOUS
  Filled 2020-02-05: qty 2

## 2020-02-05 NOTE — ED Notes (Signed)
Unable to place IV. Charge RN unable to place IV. Notified IV team. Communicated to patient via wall e interpreter. Patient verbalized understanding via wall e interpreter.

## 2020-02-05 NOTE — ED Provider Notes (Signed)
22:30: Assumed care of patient from Nuala Alpha, PA-C at change of shift pending discussion with hospitalist service for admission.  Please see prior provider note for full H&P.  Briefly patient is a 49 yo male with a hx of NICM, CHF last EF 30-35%, DM, hypertension, dyslipidemia, pancreatitis, prior DVT, & depression w/ recent admission to the hospital 10/22-10/26 2021 who returned to the ED with complaints of abdominal pain and not feeling well.   Per chart review for additional history- recent admission:  Presented due to concern for GI bleed with reported hematemesis & melena at home, found to be in afib w/ RVR & brief episodes of non-sustained vtach- started on amiodarone, found to be in DKA- insulin drip, CT abdomen/pelvis unremarkable, Pepcid for GI bleed. Converted to NSR during admission, DKA resolved, EGD done that showed no evidence of active bleeding and bleeding likely from esophagitis/gastritis- GI recommended holding Eliquis until 02/04/20.   ED Course/Procedures    Results for orders placed or performed during the hospital encounter of 02/05/20  CBC with Differential  Result Value Ref Range   WBC 5.3 4.0 - 10.5 K/uL   RBC 3.21 (L) 4.22 - 5.81 MIL/uL   Hemoglobin 9.5 (L) 13.0 - 17.0 g/dL   HCT 30.6 (L) 39 - 52 %   MCV 95.3 80.0 - 100.0 fL   MCH 29.6 26.0 - 34.0 pg   MCHC 31.0 30.0 - 36.0 g/dL   RDW 12.3 11.5 - 15.5 %   Platelets 179 150 - 400 K/uL   nRBC 0.0 0.0 - 0.2 %   Neutrophils Relative % 65 %   Neutro Abs 3.4 1.7 - 7.7 K/uL   Lymphocytes Relative 17 %   Lymphs Abs 0.9 0.7 - 4.0 K/uL   Monocytes Relative 14 %   Monocytes Absolute 0.7 0.1 - 1.0 K/uL   Eosinophils Relative 2 %   Eosinophils Absolute 0.1 0.0 - 0.5 K/uL   Basophils Relative 1 %   Basophils Absolute 0.0 0.0 - 0.1 K/uL   Immature Granulocytes 1 %   Abs Immature Granulocytes 0.05 0.00 - 0.07 K/uL  Comprehensive metabolic panel  Result Value Ref Range   Sodium 142 135 - 145 mmol/L   Potassium  4.7 3.5 - 5.1 mmol/L   Chloride 102 98 - 111 mmol/L   CO2 25 22 - 32 mmol/L   Glucose, Bld 298 (H) 70 - 99 mg/dL   BUN 24 (H) 6 - 20 mg/dL   Creatinine, Ser 1.97 (H) 0.61 - 1.24 mg/dL   Calcium 9.2 8.9 - 10.3 mg/dL   Total Protein 7.5 6.5 - 8.1 g/dL   Albumin 3.5 3.5 - 5.0 g/dL   AST 24 15 - 41 U/L   ALT 16 0 - 44 U/L   Alkaline Phosphatase 50 38 - 126 U/L   Total Bilirubin 1.0 0.3 - 1.2 mg/dL   GFR, Estimated 41 (L) >60 mL/min   Anion gap 15 5 - 15  Lipase, blood  Result Value Ref Range   Lipase 279 (H) 11 - 51 U/L  Urinalysis, Routine w reflex microscopic Urine, Clean Catch  Result Value Ref Range   Color, Urine STRAW (A) YELLOW   APPearance CLEAR CLEAR   Specific Gravity, Urine 1.010 1.005 - 1.030   pH 7.0 5.0 - 8.0   Glucose, UA >=500 (A) NEGATIVE mg/dL   Hgb urine dipstick SMALL (A) NEGATIVE   Bilirubin Urine NEGATIVE NEGATIVE   Ketones, ur NEGATIVE NEGATIVE mg/dL   Protein, ur 100 (  A) NEGATIVE mg/dL   Nitrite NEGATIVE NEGATIVE   Leukocytes,Ua NEGATIVE NEGATIVE   RBC / HPF 0-5 0 - 5 RBC/hpf   WBC, UA 0-5 0 - 5 WBC/hpf   Bacteria, UA NONE SEEN NONE SEEN  CBG monitoring, ED  Result Value Ref Range   Glucose-Capillary 346 (H) 70 - 99 mg/dL  Troponin I (High Sensitivity)  Result Value Ref Range   Troponin I (High Sensitivity) 65 (H) <18 ng/L   CT Abdomen Pelvis Wo Contrast  Result Date: 02/01/2020 CLINICAL DATA:  Abdominal pain and vomiting EXAM: CT ABDOMEN AND PELVIS WITHOUT CONTRAST TECHNIQUE: Multidetector CT imaging of the abdomen and pelvis was performed following the standard protocol without IV contrast. COMPARISON:  Aug 19, 2019 FINDINGS: Lower chest: There is mild cardiomegaly present. No hiatal hernia. The visualized portions of the lungs are clear. Hepatobiliary: Although limited due to the lack of intravenous contrast, normal in appearance without gross focal abnormality. No evidence of calcified gallstones or biliary ductal dilatation. Pancreas:   Unremarkable.  No surrounding inflammatory changes. Spleen: Normal in size. Although limited due to the lack of intravenous contrast, normal in appearance. Adrenals/Urinary Tract: Both adrenal glands appear normal. The kidneys and collecting system appear normal without evidence of urinary tract calculus or hydronephrosis. Bladder is unremarkable. Stomach/Bowel: The stomach, small bowel, and colon are normal in appearance. No inflammatory changes or obstructive findings. A moderate amount of colonic stool is present. Appendix is normal. Vascular/Lymphatic: There are no enlarged abdominal or pelvic lymph nodes. Scattered mild aortic atherosclerotic calcifications are seen without aneurysmal dilatation. Reproductive: The prostate is unremarkable. Other: No evidence of abdominal wall mass or hernia. Musculoskeletal: No acute or significant osseous findings. IMPRESSION: No acute intra-abdominal or pelvic pathology to explain the patient's symptoms. Moderate amount of colonic stool Aortic Atherosclerosis (ICD10-I70.0). Electronically Signed   By: Prudencio Pair M.D.   On: 02/01/2020 02:37   CT Angio Chest PE W and/or Wo Contrast  Result Date: 02/05/2020 CLINICAL DATA:  Left leg pain and abdominal distension EXAM: CT ANGIOGRAPHY CHEST WITH CONTRAST TECHNIQUE: Multidetector CT imaging of the chest was performed using the standard protocol during bolus administration of intravenous contrast. Multiplanar CT image reconstructions and MIPs were obtained to evaluate the vascular anatomy. CONTRAST:  70mL OMNIPAQUE IOHEXOL 350 MG/ML SOLN COMPARISON:  None. FINDINGS: Cardiovascular: There is suboptimal opacification of the main pulmonary artery, however there is a small nonocclusive thrombus seen within the right main pulmonary artery with partially occlusive thrombus extending into the posterior right lower and anterior right upper lobe lobe subsegmental arterial branches. There is mild cardiomegaly. The no pericardial  effusion or thickening. No evidence right heart strain. There is normal three-vessel brachiocephalic anatomy without proximal stenosis. The thoracic aorta is normal in appearance. Mediastinum/Nodes: No hilar, mediastinal, or axillary adenopathy. Thyroid gland, trachea, and esophagus demonstrate no significant findings. Lungs/Pleura: The lungs are clear. No pleural effusion or pneumothorax. No airspace consolidation. Upper Abdomen: No acute abnormalities present in the visualized portions of the upper abdomen. Musculoskeletal: No chest wall abnormality. No acute or significant osseous findings. Review of the MIP images confirms the above findings. Abdomen/pelvis: Lower chest: The visualized heart size within normal limits. No pericardial fluid/thickening. No hiatal hernia. The visualized portions of the lungs are clear. Hepatobiliary: The liver is normal in density without focal abnormality.The main portal vein is patent. No evidence of calcified gallstones, gallbladder wall thickening or biliary dilatation. Pancreas: Unremarkable. No pancreatic ductal dilatation or surrounding inflammatory changes. Spleen: Normal in size without  focal abnormality. Adrenals/Urinary Tract: Both adrenal glands appear normal. The kidneys and collecting system appear normal without evidence of urinary tract calculus or hydronephrosis. Bladder is unremarkable. Stomach/Bowel: The stomach, small bowel, and colon are normal in appearance. No inflammatory changes, wall thickening, or obstructive findings. There is a moderate to large amount of colonic stool present.The appendix is normal. Vascular/Lymphatic: There are no enlarged mesenteric, retroperitoneal, or pelvic lymph nodes. No significant vascular findings are present. Reproductive: The prostate is unremarkable. Other: No evidence of abdominal wall mass or hernia. Musculoskeletal: No acute or significant osseous findings. IMPRESSION: 1. Suboptimal opacification of the main pulmonary  artery. However there is nonocclusive thrombus seen within the right main pulmonary artery and partially occlusive thrombus in the right upper lobe and posterior lower lobe subsegmental arteries. 2. Minimal amount of ground-glass atelectasis at both lung bases 3. Moderate amount of colonic stool without evidence of obstruction. Electronically Signed   By: Prudencio Pair M.D.   On: 02/05/2020 22:10   CT ABDOMEN PELVIS W CONTRAST  Result Date: 02/05/2020 CLINICAL DATA:  Left leg pain and abdominal distension EXAM: CT ANGIOGRAPHY CHEST WITH CONTRAST TECHNIQUE: Multidetector CT imaging of the chest was performed using the standard protocol during bolus administration of intravenous contrast. Multiplanar CT image reconstructions and MIPs were obtained to evaluate the vascular anatomy. CONTRAST:  66mL OMNIPAQUE IOHEXOL 350 MG/ML SOLN COMPARISON:  None. FINDINGS: Cardiovascular: There is suboptimal opacification of the main pulmonary artery, however there is a small nonocclusive thrombus seen within the right main pulmonary artery with partially occlusive thrombus extending into the posterior right lower and anterior right upper lobe lobe subsegmental arterial branches. There is mild cardiomegaly. The no pericardial effusion or thickening. No evidence right heart strain. There is normal three-vessel brachiocephalic anatomy without proximal stenosis. The thoracic aorta is normal in appearance. Mediastinum/Nodes: No hilar, mediastinal, or axillary adenopathy. Thyroid gland, trachea, and esophagus demonstrate no significant findings. Lungs/Pleura: The lungs are clear. No pleural effusion or pneumothorax. No airspace consolidation. Upper Abdomen: No acute abnormalities present in the visualized portions of the upper abdomen. Musculoskeletal: No chest wall abnormality. No acute or significant osseous findings. Review of the MIP images confirms the above findings. Abdomen/pelvis: Lower chest: The visualized heart size within  normal limits. No pericardial fluid/thickening. No hiatal hernia. The visualized portions of the lungs are clear. Hepatobiliary: The liver is normal in density without focal abnormality.The main portal vein is patent. No evidence of calcified gallstones, gallbladder wall thickening or biliary dilatation. Pancreas: Unremarkable. No pancreatic ductal dilatation or surrounding inflammatory changes. Spleen: Normal in size without focal abnormality. Adrenals/Urinary Tract: Both adrenal glands appear normal. The kidneys and collecting system appear normal without evidence of urinary tract calculus or hydronephrosis. Bladder is unremarkable. Stomach/Bowel: The stomach, small bowel, and colon are normal in appearance. No inflammatory changes, wall thickening, or obstructive findings. There is a moderate to large amount of colonic stool present.The appendix is normal. Vascular/Lymphatic: There are no enlarged mesenteric, retroperitoneal, or pelvic lymph nodes. No significant vascular findings are present. Reproductive: The prostate is unremarkable. Other: No evidence of abdominal wall mass or hernia. Musculoskeletal: No acute or significant osseous findings. IMPRESSION: 1. Suboptimal opacification of the main pulmonary artery. However there is nonocclusive thrombus seen within the right main pulmonary artery and partially occlusive thrombus in the right upper lobe and posterior lower lobe subsegmental arteries. 2. Minimal amount of ground-glass atelectasis at both lung bases 3. Moderate amount of colonic stool without evidence of obstruction. Electronically Signed  By: Prudencio Pair M.D.   On: 02/05/2020 22:10   US RENAL  Result Date: 02/01/2020 CLINICAL DATA:  49 year old male with acute on chronic kidney injury. EXAM: RENAL / URINARY TRACT ULTRASOUND COMPLETE COMPARISON:  Prior CTs FINDINGS: Right Kidney: Renal measurements: 11.6 x 5.3 x 6.0 cm = volume: 491 mL. Echogenicity within normal limits. No mass or  hydronephrosis visualized. Left Kidney: Renal measurements: 12.4 x 8.5 x 8.8 cm = volume: 791 mL. Echogenicity within normal limits. No mass or hydronephrosis visualized. Bladder: Appears normal for degree of bladder distention. Other: None. IMPRESSION: Unremarkable renal ultrasound. Electronically Signed   By: Margarette Canada M.D.   On: 02/01/2020 16:30   DG Abd Acute W/Chest  Result Date: 02/05/2020 CLINICAL DATA:  Chest pain, abdominal pain EXAM: DG ABDOMEN ACUTE WITH 1 VIEW CHEST COMPARISON:  01/31/2020 FINDINGS: No new consolidation or edema. No pleural effusion or pneumothorax. Stable cardiomediastinal contours with cardiomegaly. Left chest wall ICD is present. Bowel gas pattern is unremarkable. There is no free air. No radiopaque calculi identified. No acute osseous abnormality. IMPRESSION: No significant change since 01/31/2020.  No acute abnormality. Electronically Signed   By: Macy Mis M.D.   On: 02/05/2020 18:57   DG ABD ACUTE 2+V W 1V CHEST  Result Date: 01/31/2020 CLINICAL DATA:  Abdominal pain and vomiting starting today. EXAM: DG ABDOMEN ACUTE WITH 1 VIEW CHEST COMPARISON:  Chest 08/18/2019.  CT abdomen and pelvis 08/19/2019 FINDINGS: Cardiac pacemaker. Shallow inspiration. Heart size and pulmonary vascularity are normal for technique. Lungs are clear. No pleural effusions. No pneumothorax. Scattered gas and stool throughout the colon. No small or large bowel distention. No free intra-abdominal air. No abnormal air-fluid levels. No radiopaque stones. Calcified phleboliths in the pelvis. Visualized bones and soft tissue contours are normal. IMPRESSION: No evidence of active pulmonary disease. Nonobstructive bowel gas pattern. Electronically Signed   By: Lucienne Capers M.D.   On: 01/31/2020 23:52   EKG Interpretation  Date/Time:  Wednesday February 05 2020 20:42:25 EDT Ventricular Rate:  80 PR Interval:    QRS Duration: 97 QT Interval:  435 QTC Calculation: 502 R Axis:   20 Text  Interpretation: Sinus rhythm Left atrial enlargement Borderline T wave abnormalities Prolonged QT interval Confirmed by Madalyn Rob [phone]) on 02/05/2020 10:09:49 PM .Critical Care Performed by: Amaryllis Dyke, PA-C Authorized by: Amaryllis Dyke, PA-C    CRITICAL CARE Performed by: Kennith Maes   Total critical care time: 30 minutes  Pulmonary embolism with initiation of heparin.   Critical care time was exclusive of separately billable procedures and treating other patients.  Critical care was necessary to treat or prevent imminent or life-threatening deterioration.  Critical care was time spent personally by me on the following activities: development of treatment plan with patient and/or surrogate as well as nursing, discussions with consultants, evaluation of patient's response to treatment, examination of patient, obtaining history from patient or surrogate, ordering and performing treatments and interventions, ordering and review of laboratory studies, ordering and review of radiographic studies, pulse oximetry and re-evaluation of patient's condition.   MDM   During ED visit today found to be rather hypertensive- hx of similar, vitals otherwise reassuring.  Labs reviewed & interpreted by me:  CBC: Mild downtrending hgb. --> Fecal occult negative, no melena or gross blood on exam per prior provider.  CMP: Improved renal function.  Lipase: Significantly elevated compared to prior raising concern for pancreatitis.  Troponin: Elevated but improved from previous.  UA: Not consistent  w/ UTI.   EKG: Sinus, QTc prolongated some.   CT imaging reviewed & interpreted by me- agree with radiologist read: Suboptimal opacification of the main pulmonary artery. However there is nonocclusive thrombus seen within the right main pulmonary artery and partially occlusive thrombus in the right upper lobe and posterior lower lobe subsegmental arteries. Minimal amount of  ground-glass atelectasis at both lung bases Moderate amount of colonic stool without evidence of obstruction.  Patient with what appears to be new PE, also w/ elevated lipase concern for pancreatitis needing continued pain control. Will discuss w/ hospitalist service for admission & discuss anticoagulation for his PE, anticipate heparin, per chart review per GI okay to restart anticoagulation 02/04/20 S/p EGD.   23:25: CONSULT: Discussed with hospitalist Dr. Clearence Ped- accepts admission, we will start heparin for PE, appreciate consultation.       Leafy Kindle 02/05/20 [id number]    Lucrezia Starch, MD 02/06/20 [phone]

## 2020-02-05 NOTE — ED Notes (Signed)
Pt in CT.

## 2020-02-05 NOTE — Anesthesia Postprocedure Evaluation (Signed)
Anesthesia Post Note  Patient: Troy Rush  Procedure(s) Performed: ESOPHAGOGASTRODUODENOSCOPY (EGD) WITH PROPOFOL (N/A )     Patient location during evaluation: PACU Anesthesia Type: MAC Level of consciousness: awake and alert Pain management: pain level controlled Vital Signs Assessment: post-procedure vital signs reviewed and stable Respiratory status: spontaneous breathing Cardiovascular status: stable Anesthetic complications: no   No complications documented.  Last Vitals:  Vitals:   02/04/20 1000 02/04/20 1200  BP: (!) 157/101 (!) 184/100  Pulse: 74 70  Resp: 16 (!) 22  Temp:  37.1 C  SpO2: 98% 98%    Last Pain:  Vitals:   02/04/20 1200  TempSrc: Oral  PainSc:                  Nolon Nations

## 2020-02-05 NOTE — Progress Notes (Signed)
ANTICOAGULATION CONSULT NOTE - Initial Consult  Pharmacy Consult for Heparin Indication: pulmonary embolus  Allergies  Allergen Reactions   Lisinopril Cough    Patient Measurements: Height: 5\' 10"  (177.8 cm) Weight: 104.5 kg (230 lb 5 oz) IBW/kg (Calculated) : 73 HEPARIN DW (KG): 95.2   Vital Signs: Temp: 98.4 F (36.9 C) (10/27 1737) Temp Source: Oral (10/27 1737) BP: 211/104 (10/27 2300) Pulse Rate: 78 (10/27 2300)  Labs: Recent Labs    02/03/20 0305 02/03/20 0305 02/03/20 1639 02/04/20 0822 02/05/20 2035 02/05/20 2232  HGB 11.4*   < >  --  10.6* 9.5*  --   HCT 37.2*  --   --  33.4* 30.6*  --   PLT 191  --   --  171 179  --   CREATININE 2.85*   < > 2.65* 2.24* 1.97*  --   TROPONINIHS  --   --   --   --  65* 66*   < > = values in this interval not displayed.    Estimated Creatinine Clearance: 54.3 mL/min (A) (by C-G formula based on SCr of 1.97 mg/dL (H)).   Medical History: Past Medical History:  Diagnosis Date   AICD (automatic cardioverter/defibrillator) present 10/14/2016   Bilateral deafness    Bronchitis    CHF (congestive heart failure) ([place])    Depression    Diabetes mellitus without complication ([place])    DVT, lower extremity ([place]) 07/12/2019   Dyslipidemia    Headache 08/2019   Hypertension    Insomnia 08/2019   NICM (nonischemic cardiomyopathy) ([place]) 09/15/2016   Pancreatitis    Vitamin D deficiency 08/2019    Medications:  Eliquis 5mg  BID- last dose 01/29/20  Assessment: 49 yo M with hx VTE  and Afib who is chronically on Eliquis.  He was recently hospitalized 10/22-10/26 with GI Bleeding.  Eliquis was held due to bleeding with GI recommendations to resume on 10/26.  Patient has not yet resumed medication.   He returned to hospital today feeling worse with LLE & abdominal pain.  Chest CT + for new PE. Baseline labs- Hg 9.5- low and trending down.  Pltc WNL. No acute bleeding per PA exam notes.  Scr elevated but trending  down.    Goal of Therapy:  Heparin level 0.3-0.7 units/ml - **will target lower end of goal range due to recent bleeding** Monitor platelets by anticoagulation protocol: Yes   Plan:  Heparin 1500 units IV x1 then 1500 units/hr - conservative dosing in light of recent GI bleeding.   Check heparin level 6h after heparin infusion starts Daily heparin level & CBC while on heparin Baseline coags pending- anticipate will be normal as pt has not taken Eliquis in 1 week however will check given AKI.   Netta Cedars PharmD, BCPS 02/05/2020,11:42 PM

## 2020-02-05 NOTE — ED Provider Notes (Signed)
[facility] DEPT Provider Note   CSN: [id number] Arrival date & time: 02/05/20  1733     History Chief Complaint  Patient presents with  . Abdominal Pain  . Leg Pain   ASL video interpreter used throughout visit. HO Troy Rush is a 49 y.o. male history of AICD, CHF, diabetes, DVT, nonischemic cardiomyopathy, deafness, CKD, depression.  Patient presents today with primary concern of abdominal pain and distention began this morning, denies similar in the past.  Describes a sharp aching pain to his entire abdomen no clear aggravating or alleviating factors, occasionally will radiate up into his chest associated with full distended feeling and nausea.  Additionally describes bilateral leg pain and swelling which is mild aching constant nonradiating no aggravating or alleviating factors.  Patient reports that he was discharge from hospital yesterday has had difficulty obtaining his medications since yesterday.  He reports that he has not been on his Eliquis since hospitalization as they were concerned for a GI bleed.  Denies fever/chills, fall/injury, vomiting, diarrhea, cough, dysuria/hematuria or any additional concerns.  HPI     Past Medical History:  Diagnosis Date  . AICD (automatic cardioverter/defibrillator) present 10/14/2016  . Bilateral deafness   . Bronchitis   . CHF (congestive heart failure) ([place])   . Depression   . Diabetes mellitus without complication ([place])   . DVT, lower extremity ([place]) 07/12/2019  . Dyslipidemia   . Headache 08/2019  . Hypertension   . Insomnia 08/2019  . NICM (nonischemic cardiomyopathy) ([place]) 09/15/2016  . Pancreatitis   . Vitamin D deficiency 08/2019    Patient Active Problem List   Diagnosis Date Noted  . Acute kidney injury superimposed on CKD ([place])   . Chronic combined systolic and diastolic heart failure ([place])   . Atrial fibrillation with RVR ([place]) 02/01/2020  . NSVT (nonsustained ventricular  tachycardia) ([place]) 02/01/2020  . Acute GI bleeding 02/01/2020  . DKA (diabetic ketoacidosis) ([place]) 02/01/2020  . Bilateral deafness 08/15/2019  . Type 2 diabetes mellitus with complication, with long-term current use of insulin ([place]) 08/15/2019  . Hemoglobin A1C between 7% and 9% indicating borderline diabetic control 08/15/2019  . Insomnia 08/15/2019  . Abdominal pain 11/14/2018  . Ischemic cardiomyopathy 10/14/2016  . NICM (nonischemic cardiomyopathy) ([place]) 09/15/2016  . Chronic systolic CHF (congestive heart failure) ([place]) 05/30/2016  . HTN (hypertension) 05/30/2016  . Deaf 05/30/2016  . Hyperkalemia 05/30/2016  . Diabetes ([place]) 03/21/2016  . Major depressive disorder, recurrent episode ([place]) 03/21/2016    Past Surgical History:  Procedure Laterality Date  . BACK SURGERY    . CARDIAC CATHETERIZATION    . ESOPHAGOGASTRODUODENOSCOPY (EGD) WITH PROPOFOL N/A 02/02/2020   Procedure: ESOPHAGOGASTRODUODENOSCOPY (EGD) WITH PROPOFOL;  Surgeon: Arta Silence, MD;  Location: [facility];  Service: Endoscopy;  Laterality: N/A;  . ICD IMPLANT  10/14/2016  . ICD IMPLANT N/A 10/14/2016   Procedure: ICD Implant;  Surgeon: Deboraha Sprang, MD;  Location: [facility];  Service: Cardiovascular;  Laterality: N/A;  . WRIST SURGERY         Family History  Problem Relation Age of Onset  . Hypertension Mother   . Healthy Father   . Stroke Father     Social History   Tobacco Use  . Smoking status: Never Smoker  . Smokeless tobacco: Never Used  Vaping Use  . Vaping Use: Never used  Substance Use Topics  . Alcohol use: Yes    Alcohol/week: 1.0 - 2.0 standard drink  Types: 1 - 2 Glasses of wine per week    Comment: rare  . Drug use: No    Home Medications Prior to Admission medications   Medication Sig Start Date End Date Taking? Authorizing Provider  amiodarone (PACERONE) 200 MG tablet Take 1 tablet (200 mg total) by mouth 2 (two) times daily for 7 days. 02/04/20 02/11/20   Raiford Noble Latif, DO  apixaban (ELIQUIS) 5 MG TABS tablet Take 1 tablet (5 mg total) by mouth 2 (two) times daily. 09/06/19   Larey Dresser, MD  atorvastatin (LIPITOR) 80 MG tablet TAKE 1 TABLET(80 MG) BY MOUTH DAILY Patient taking differently: Take 80 mg by mouth at bedtime.  10/01/19   Dorena Dew, FNP  carvedilol (COREG) 25 MG tablet Take 1 tablet (25 mg total) by mouth 2 (two) times daily with a meal. 02/04/20   Sheikh, Omair Latif, DO  citalopram (CELEXA) 40 MG tablet Take 40 mg by mouth daily as needed (depression).     [provider]  fenofibrate 54 MG tablet Take 1 tablet (54 mg total) by mouth daily. 12/11/19 03/10/20  Larey Dresser, MD  hydrALAZINE (APRESOLINE) 25 MG tablet Take 1.5 tablets (37.5 mg total) by mouth every 8 (eight) hours. 02/04/20   Raiford Noble Latif, DO  icosapent Ethyl (VASCEPA) 1 g capsule Take 2 capsules (2 g total) by mouth 2 (two) times daily. 09/11/19   Larey Dresser, MD  insulin glargine (LANTUS) 100 UNIT/ML injection Inject 0.24 mLs (24 Units total) into the skin daily. 02/04/20   Raiford Noble Latif, DO  isosorbide mononitrate (IMDUR) 30 MG 24 hr tablet Take 1 tablet (30 mg total) by mouth daily. 09/06/19 02/01/20  Larey Dresser, MD  latanoprost (XALATAN) 0.005 % ophthalmic solution Place 1 drop into both eyes at bedtime. 01/20/20   [provider]  pantoprazole (PROTONIX) 40 MG tablet Take 1 tablet (40 mg total) by mouth 2 (two) times daily. 02/04/20   Sheikh, Omair Latif, DO  topiramate (TOPAMAX) 50 MG tablet TAKE 1 TABLET BY MOUTH TWICE DAILY AS NEEDED FOR HEADACHE Patient taking differently: Take 50 mg by mouth 2 (two) times daily as needed (headache).  10/31/19   [name], FNP  traZODone (DESYREL) 100 MG tablet Take 1 tablet (100 mg total) by mouth at bedtime. 08/13/19   [name], FNP  Vitamin D, Ergocalciferol, (DRISDOL) 1.25 MG (50000 UNIT) CAPS capsule Take 1 capsule (50,000 Units total) by mouth every 7  (seven) days. Patient taking differently: Take 50,000 Units by mouth every Monday.  08/19/19   [name], FNP    Allergies    Lisinopril  Review of Systems   Review of Systems Ten systems are reviewed and are negative for acute change except as noted in the HPI  Physical Exam Updated Vital Signs BP (!) 215/112 (BP Location: Left Arm)   Pulse 79   Temp 98.4 F (36.9 C) (Oral)   Resp 16   SpO2 99%   Physical Exam Constitutional:      General: He is not in acute distress.    Appearance: Normal appearance. He is well-developed. He is obese. He is not ill-appearing or diaphoretic.  HENT:     Head: Normocephalic and atraumatic.  Eyes:     General: Vision grossly intact. Gaze aligned appropriately.     Pupils: Pupils are equal, round, and reactive to light.  Neck:     Trachea: Trachea and phonation normal.  Pulmonary:  Effort: Pulmonary effort is normal. No respiratory distress.  Abdominal:     General: Abdomen is protuberant.     Palpations: Abdomen is soft.     Tenderness: There is generalized abdominal tenderness. There is no guarding or rebound.  Genitourinary:    Comments: Rectal examination chaperoned by Kiristin RN.  No external bleeding hemorrhoids.  Normal rectal tone.  No palpable internal hemorrhoids.  No gross blood on examination.  Light brown stool.  Video interpreter used to obtain consent for rectal exam. Musculoskeletal:        General: Normal range of motion.     Cervical back: Normal range of motion.  Skin:    General: Skin is warm and dry.  Neurological:     Mental Status: He is alert.     GCS: GCS eye subscore is 4. GCS verbal subscore is 5. GCS motor subscore is 6.     Comments: Speech is clear and goal oriented, follows commands Major Cranial nerves without deficit, no facial droop Moves extremities without ataxia, coordination intact  Psychiatric:        Behavior: Behavior normal.     ED Results / Procedures / Treatments   Labs (all  labs ordered are listed, but only abnormal results are displayed) Labs Reviewed  CBC WITH DIFFERENTIAL/PLATELET - Abnormal; Notable for the following components:      Result Value   RBC 3.21 (*)    Hemoglobin 9.5 (*)    HCT 30.6 (*)    All other components within normal limits  COMPREHENSIVE METABOLIC PANEL - Abnormal; Notable for the following components:   Glucose, Bld 298 (*)    BUN 24 (*)    Creatinine, Ser 1.97 (*)    GFR, Estimated 41 (*)    All other components within normal limits  LIPASE, BLOOD - Abnormal; Notable for the following components:   Lipase 279 (*)    All other components within normal limits  URINALYSIS, ROUTINE W REFLEX MICROSCOPIC - Abnormal; Notable for the following components:   Color, Urine STRAW (*)    Glucose, UA >=500 (*)    Hgb urine dipstick SMALL (*)    Protein, ur 100 (*)    All other components within normal limits  CBG MONITORING, ED - Abnormal; Notable for the following components:   Glucose-Capillary 346 (*)    All other components within normal limits  TROPONIN I (HIGH SENSITIVITY) - Abnormal; Notable for the following components:   Troponin I (High Sensitivity) 65 (*)    All other components within normal limits  RESPIRATORY PANEL BY RT PCR (FLU A&B, COVID)  POC OCCULT BLOOD, ED  TROPONIN I (HIGH SENSITIVITY)    EKG EKG Interpretation  Date/Time:  Wednesday February 05 2020 20:42:25 EDT Ventricular Rate:  80 PR Interval:    QRS Duration: 97 QT Interval:  435 QTC Calculation: 502 R Axis:   20 Text Interpretation: Sinus rhythm Left atrial enlargement Borderline T wave abnormalities Prolonged QT interval Confirmed by Madalyn Rob [phone]) on 02/05/2020 10:09:49 PM   Radiology CT Angio Chest PE W and/or Wo Contrast  Result Date: 02/05/2020 CLINICAL DATA:  Left leg pain and abdominal distension EXAM: CT ANGIOGRAPHY CHEST WITH CONTRAST TECHNIQUE: Multidetector CT imaging of the chest was performed using the standard protocol  during bolus administration of intravenous contrast. Multiplanar CT image reconstructions and MIPs were obtained to evaluate the vascular anatomy. CONTRAST:  78mL OMNIPAQUE IOHEXOL 350 MG/ML SOLN COMPARISON:  None. FINDINGS: Cardiovascular: There is suboptimal opacification of the  main pulmonary artery, however there is a small nonocclusive thrombus seen within the right main pulmonary artery with partially occlusive thrombus extending into the posterior right lower and anterior right upper lobe lobe subsegmental arterial branches. There is mild cardiomegaly. The no pericardial effusion or thickening. No evidence right heart strain. There is normal three-vessel brachiocephalic anatomy without proximal stenosis. The thoracic aorta is normal in appearance. Mediastinum/Nodes: No hilar, mediastinal, or axillary adenopathy. Thyroid gland, trachea, and esophagus demonstrate no significant findings. Lungs/Pleura: The lungs are clear. No pleural effusion or pneumothorax. No airspace consolidation. Upper Abdomen: No acute abnormalities present in the visualized portions of the upper abdomen. Musculoskeletal: No chest wall abnormality. No acute or significant osseous findings. Review of the MIP images confirms the above findings. Abdomen/pelvis: Lower chest: The visualized heart size within normal limits. No pericardial fluid/thickening. No hiatal hernia. The visualized portions of the lungs are clear. Hepatobiliary: The liver is normal in density without focal abnormality.The main portal vein is patent. No evidence of calcified gallstones, gallbladder wall thickening or biliary dilatation. Pancreas: Unremarkable. No pancreatic ductal dilatation or surrounding inflammatory changes. Spleen: Normal in size without focal abnormality. Adrenals/Urinary Tract: Both adrenal glands appear normal. The kidneys and collecting system appear normal without evidence of urinary tract calculus or hydronephrosis. Bladder is unremarkable.  Stomach/Bowel: The stomach, small bowel, and colon are normal in appearance. No inflammatory changes, wall thickening, or obstructive findings. There is a moderate to large amount of colonic stool present.The appendix is normal. Vascular/Lymphatic: There are no enlarged mesenteric, retroperitoneal, or pelvic lymph nodes. No significant vascular findings are present. Reproductive: The prostate is unremarkable. Other: No evidence of abdominal wall mass or hernia. Musculoskeletal: No acute or significant osseous findings. IMPRESSION: 1. Suboptimal opacification of the main pulmonary artery. However there is nonocclusive thrombus seen within the right main pulmonary artery and partially occlusive thrombus in the right upper lobe and posterior lower lobe subsegmental arteries. 2. Minimal amount of ground-glass atelectasis at both lung bases 3. Moderate amount of colonic stool without evidence of obstruction. Electronically Signed   By: Prudencio Pair M.D.   On: 02/05/2020 22:10   CT ABDOMEN PELVIS W CONTRAST  Result Date: 02/05/2020 CLINICAL DATA:  Left leg pain and abdominal distension EXAM: CT ANGIOGRAPHY CHEST WITH CONTRAST TECHNIQUE: Multidetector CT imaging of the chest was performed using the standard protocol during bolus administration of intravenous contrast. Multiplanar CT image reconstructions and MIPs were obtained to evaluate the vascular anatomy. CONTRAST:  32mL OMNIPAQUE IOHEXOL 350 MG/ML SOLN COMPARISON:  None. FINDINGS: Cardiovascular: There is suboptimal opacification of the main pulmonary artery, however there is a small nonocclusive thrombus seen within the right main pulmonary artery with partially occlusive thrombus extending into the posterior right lower and anterior right upper lobe lobe subsegmental arterial branches. There is mild cardiomegaly. The no pericardial effusion or thickening. No evidence right heart strain. There is normal three-vessel brachiocephalic anatomy without proximal  stenosis. The thoracic aorta is normal in appearance. Mediastinum/Nodes: No hilar, mediastinal, or axillary adenopathy. Thyroid gland, trachea, and esophagus demonstrate no significant findings. Lungs/Pleura: The lungs are clear. No pleural effusion or pneumothorax. No airspace consolidation. Upper Abdomen: No acute abnormalities present in the visualized portions of the upper abdomen. Musculoskeletal: No chest wall abnormality. No acute or significant osseous findings. Review of the MIP images confirms the above findings. Abdomen/pelvis: Lower chest: The visualized heart size within normal limits. No pericardial fluid/thickening. No hiatal hernia. The visualized portions of the lungs are clear. Hepatobiliary: The liver is  normal in density without focal abnormality.The main portal vein is patent. No evidence of calcified gallstones, gallbladder wall thickening or biliary dilatation. Pancreas: Unremarkable. No pancreatic ductal dilatation or surrounding inflammatory changes. Spleen: Normal in size without focal abnormality. Adrenals/Urinary Tract: Both adrenal glands appear normal. The kidneys and collecting system appear normal without evidence of urinary tract calculus or hydronephrosis. Bladder is unremarkable. Stomach/Bowel: The stomach, small bowel, and colon are normal in appearance. No inflammatory changes, wall thickening, or obstructive findings. There is a moderate to large amount of colonic stool present.The appendix is normal. Vascular/Lymphatic: There are no enlarged mesenteric, retroperitoneal, or pelvic lymph nodes. No significant vascular findings are present. Reproductive: The prostate is unremarkable. Other: No evidence of abdominal wall mass or hernia. Musculoskeletal: No acute or significant osseous findings. IMPRESSION: 1. Suboptimal opacification of the main pulmonary artery. However there is nonocclusive thrombus seen within the right main pulmonary artery and partially occlusive thrombus in  the right upper lobe and posterior lower lobe subsegmental arteries. 2. Minimal amount of ground-glass atelectasis at both lung bases 3. Moderate amount of colonic stool without evidence of obstruction. Electronically Signed   By: Prudencio Pair M.D.   On: 02/05/2020 22:10   DG Abd Acute W/Chest  Result Date: 02/05/2020 CLINICAL DATA:  Chest pain, abdominal pain EXAM: DG ABDOMEN ACUTE WITH 1 VIEW CHEST COMPARISON:  01/31/2020 FINDINGS: No new consolidation or edema. No pleural effusion or pneumothorax. Stable cardiomediastinal contours with cardiomegaly. Left chest wall ICD is present. Bowel gas pattern is unremarkable. There is no free air. No radiopaque calculi identified. No acute osseous abnormality. IMPRESSION: No significant change since 01/31/2020.  No acute abnormality. Electronically Signed   By: Macy Mis M.D.   On: 02/05/2020 18:57    Procedures Procedures (including critical care time)  Medications Ordered in ED Medications  morphine 4 MG/ML injection 4 mg (has no administration in time range)  fentaNYL (SUBLIMAZE) injection 50 mcg (50 mcg Intravenous Given 02/05/20 2046)  lactated ringers bolus 500 mL (0 mLs Intravenous Stopped 02/05/20 2223)  pantoprazole (PROTONIX) injection 40 mg (40 mg Intravenous Given 02/05/20 2048)  iohexol (OMNIPAQUE) 350 MG/ML injection 75 mL (75 mLs Intravenous Contrast Given 02/05/20 2152)    ED Course  I have reviewed the triage vital signs and the nursing notes.  Pertinent labs & imaging results that were available during my care of the patient were reviewed by me and considered in my medical decision making (see chart for details).    MDM Rules/Calculators/A&P                         Additional history obtained from: 1. Nursing notes from this visit. 2. Review of electronic medical records.  Reviewed discharge summary from yesterday.  Patient had been previously admitted for nausea vomiting hematemesis and black stools.  He had been found  to be in A. fib RVR with some episodes of nonsustained V. tach, was started on amiodarone drip.  He was also in DKA during previous ED visit.  CT abdomen pelvis was unremarkable.  He was treated with insulin drip Pepcid.  He had EGD done during that visit which showed esophagitis and gastritis without bleeding.  GI recommended holding Eliquis until 02/04/2020.  Of note patient reports he has not yet restarted his Eliquis. -------------- 50 year old male with history as above presented for abdominal pain, questionable distention, chest pain that began this morning.  He has not yet started any of his daily medication  since his discharge yesterday.  He has not been on Eliquis for the past few days.  Additionally he is complaining of bilateral leg pain and swelling.  On exam he is resting comfortably no acute distress, no fever, tachycardia, tachypnea or hypoxia on room air.  He is hypertensive but this appears baseline on review of historical data.  Suspect this some hypertension is due to his pain, will give pain medication, obtain abdominal labs in addition to troponin, chest x-ray, abdominal x-ray and CT scans.  Will obtain CT angio chest PE study given patient is not on Eliquis and is now having chest pain.  Additionally will obtain CT abdomen pelvis for evaluation of possible infection.  Given concern of previous seen gastritis will give Protonix and fluids. ------------------------ I ordered, reviewed and interpreted labs which include: CBG 346 Urinalysis without evidence of infection Hemoglobin with anemia at 9.5, no leukocytosis Initial high-sensitivity troponin 65, improved from 459 3 days ago Lipase elevated at 279, possible pancreatitis CMP shows improvement of creatinine to 1.97, no emergent lecture derangement, LFT elevations or gap  DG Abd/Chest:  IMPRESSION:  No significant change since 01/31/2020. No acute abnormality.   CT Angio Chest PE and ABD/Pelvis:  IMPRESSION:  1. Suboptimal  opacification of the main pulmonary artery. However  there is nonocclusive thrombus seen within the right main pulmonary  artery and partially occlusive thrombus in the right upper lobe and  posterior lower lobe subsegmental arteries.  2. Minimal amount of ground-glass atelectasis at both lung bases  3. Moderate amount of colonic stool without evidence of obstruction.   EKG: Sinus rhythm Left atrial enlargement Borderline T wave abnormalities Prolonged QT interval Confirmed by Madalyn Rob [phone]) on 02/05/2020 10:09:49 PM - Patient will need admission for pulmonary embolism as well as pancreatitis.  Patient remains stable at this time no tachycardia or hypoxia on room air.  Patient seen and evaluated by Dr. Roslynn Amble, will await hospitalist consultation prior to anticoagulation.  Rectal examination without gross blood, Hemoccult pending.  ---- Care handoff given to [facility], PA-C at shift change.  Plan of care is to consult with hospitalist for admission.  Note: Portions of this report may have been transcribed using voice recognition software. Every effort was made to ensure accuracy; however, inadvertent computerized transcription errors may still be present. Final Clinical Impression(s) / ED Diagnoses Final diagnoses:  Multiple subsegmental pulmonary emboli without acute cor pulmonale (HCC)  Acute pancreatitis, unspecified complication status, unspecified pancreatitis type    Rx / DC Orders ED Discharge Orders    None       Gari Crown 02/05/20 2239    Lucrezia Starch, MD 02/06/20 [phone]

## 2020-02-05 NOTE — ED Triage Notes (Signed)
Pt arrived via walk in, c/o left leg pain and abd distention. Was recently discharged for DKA and A fib rvr, states he felt better for a couple hours and then started to feel "sick" again.    All information obtained using translation services via [facility]

## 2020-02-06 ENCOUNTER — Other Ambulatory Visit: Payer: Self-pay

## 2020-02-06 ENCOUNTER — Inpatient Hospital Stay (HOSPITAL_COMMUNITY): Payer: Medicare Other

## 2020-02-06 DIAGNOSIS — I2693 Single subsegmental pulmonary embolism without acute cor pulmonale: Secondary | ICD-10-CM | POA: Diagnosis not present

## 2020-02-06 DIAGNOSIS — I2694 Multiple subsegmental pulmonary emboli without acute cor pulmonale: Secondary | ICD-10-CM

## 2020-02-06 DIAGNOSIS — I2699 Other pulmonary embolism without acute cor pulmonale: Secondary | ICD-10-CM | POA: Diagnosis not present

## 2020-02-06 DIAGNOSIS — K859 Acute pancreatitis without necrosis or infection, unspecified: Principal | ICD-10-CM

## 2020-02-06 LAB — COMPREHENSIVE METABOLIC PANEL
ALT: 15 U/L (ref 0–44)
AST: 18 U/L (ref 15–41)
Albumin: 3.1 g/dL — ABNORMAL LOW (ref 3.5–5.0)
Alkaline Phosphatase: 45 U/L (ref 38–126)
Anion gap: 10 (ref 5–15)
BUN: 19 mg/dL (ref 6–20)
CO2: 23 mmol/L (ref 22–32)
Calcium: 8.4 mg/dL — ABNORMAL LOW (ref 8.9–10.3)
Chloride: 104 mmol/L (ref 98–111)
Creatinine, Ser: 1.74 mg/dL — ABNORMAL HIGH (ref 0.61–1.24)
GFR, Estimated: 47 mL/min — ABNORMAL LOW (ref >60)
Glucose, Bld: 203 mg/dL — ABNORMAL HIGH (ref 70–99)
Potassium: 4.1 mmol/L (ref 3.5–5.1)
Sodium: 137 mmol/L (ref 135–145)
Total Bilirubin: 0.9 mg/dL (ref 0.3–1.2)
Total Protein: 6.4 g/dL — ABNORMAL LOW (ref 6.5–8.1)

## 2020-02-06 LAB — MAGNESIUM: Magnesium: 2.1 mg/dL (ref 1.7–2.4)

## 2020-02-06 LAB — CBC WITH DIFFERENTIAL/PLATELET
Abs Immature Granulocytes: 0.03 10*3/uL (ref 0.00–0.07)
Basophils Absolute: 0 10*3/uL (ref 0.0–0.1)
Basophils Relative: 1 %
Eosinophils Absolute: 0.1 10*3/uL (ref 0.0–0.5)
Eosinophils Relative: 2 %
HCT: 30.2 % — ABNORMAL LOW (ref 39.0–52.0)
Hemoglobin: 9.3 g/dL — ABNORMAL LOW (ref 13.0–17.0)
Immature Granulocytes: 1 %
Lymphocytes Relative: 20 %
Lymphs Abs: 1.1 10*3/uL (ref 0.7–4.0)
MCH: 29.5 pg (ref 26.0–34.0)
MCHC: 30.8 g/dL (ref 30.0–36.0)
MCV: 95.9 fL (ref 80.0–100.0)
Monocytes Absolute: 0.8 10*3/uL (ref 0.1–1.0)
Monocytes Relative: 15 %
Neutro Abs: 3.5 10*3/uL (ref 1.7–7.7)
Neutrophils Relative %: 61 %
Platelets: 160 10*3/uL (ref 150–400)
RBC: 3.15 MIL/uL — ABNORMAL LOW (ref 4.22–5.81)
RDW: 12.7 % (ref 11.5–15.5)
WBC: 5.6 10*3/uL (ref 4.0–10.5)
nRBC: 0 % (ref 0.0–0.2)

## 2020-02-06 LAB — ECHOCARDIOGRAM COMPLETE
Area-P 1/2: 2.91 cm2
Height: 70 in
S' Lateral: 4.1 cm
Weight: 3685 oz

## 2020-02-06 LAB — HEPARIN LEVEL (UNFRACTIONATED)
Heparin Unfractionated: <0.1 IU/mL — ABNORMAL LOW (ref 0.30–0.70)
Heparin Unfractionated: 0.46 IU/mL (ref 0.30–0.70)
Heparin Unfractionated: 0.46 IU/mL (ref 0.30–0.70)

## 2020-02-06 LAB — LIPID PANEL
Cholesterol: 274 mg/dL — ABNORMAL HIGH (ref 0–200)
HDL: 66 mg/dL (ref >40)
LDL Cholesterol: 180 mg/dL — ABNORMAL HIGH (ref 0–99)
Total CHOL/HDL Ratio: 4.2 RATIO
Triglycerides: 138 mg/dL (ref <150)
VLDL: 28 mg/dL (ref 0–40)

## 2020-02-06 LAB — PROTIME-INR
INR: 1 (ref 0.8–1.2)
Prothrombin Time: 12.8 seconds (ref 11.4–15.2)

## 2020-02-06 LAB — GLUCOSE, CAPILLARY
Glucose-Capillary: 186 mg/dL — ABNORMAL HIGH (ref 70–99)
Glucose-Capillary: 184 mg/dL — ABNORMAL HIGH (ref 70–99)
Glucose-Capillary: 137 mg/dL — ABNORMAL HIGH (ref 70–99)
Glucose-Capillary: 242 mg/dL — ABNORMAL HIGH (ref 70–99)
Glucose-Capillary: 165 mg/dL — ABNORMAL HIGH (ref 70–99)

## 2020-02-06 LAB — APTT: aPTT: 30 seconds (ref 24–36)

## 2020-02-06 MED ORDER — POLYETHYLENE GLYCOL 3350 17 G PO PACK
17.0000 g | PACK | Freq: Every day | ORAL | Status: DC
  Administered 2020-02-06: 17 g via ORAL
  Filled 2020-02-06 (×2): qty 1

## 2020-02-06 MED ORDER — ATORVASTATIN CALCIUM 40 MG PO TABS
80.0000 mg | ORAL_TABLET | Freq: Every day | ORAL | Status: DC
  Administered 2020-02-06 – 2020-02-07 (×2): 80 mg via ORAL
  Filled 2020-02-06 (×2): qty 2

## 2020-02-06 MED ORDER — MORPHINE SULFATE (PF) 4 MG/ML IV SOLN
4.0000 mg | INTRAVENOUS | Status: DC | PRN
  Administered 2020-02-06: 4 mg via INTRAVENOUS
  Filled 2020-02-06: qty 1

## 2020-02-06 MED ORDER — PANTOPRAZOLE SODIUM 40 MG PO TBEC
40.0000 mg | DELAYED_RELEASE_TABLET | Freq: Two times a day (BID) | ORAL | Status: DC
  Administered 2020-02-06 – 2020-02-08 (×5): 40 mg via ORAL
  Filled 2020-02-06 (×5): qty 1

## 2020-02-06 MED ORDER — SENNOSIDES-DOCUSATE SODIUM 8.6-50 MG PO TABS
2.0000 | ORAL_TABLET | Freq: Two times a day (BID) | ORAL | Status: DC
  Administered 2020-02-06 – 2020-02-08 (×4): 2 via ORAL
  Filled 2020-02-06 (×4): qty 2

## 2020-02-06 MED ORDER — CITALOPRAM HYDROBROMIDE 20 MG PO TABS: 40.0000 mg | ORAL_TABLET | Freq: Every day | ORAL | Status: DC | PRN

## 2020-02-06 MED ORDER — HEPARIN (PORCINE) 25000 UT/250ML-% IV SOLN
1500.0000 [IU]/h | INTRAVENOUS | Status: AC
  Administered 2020-02-06 (×2): 1500 [IU]/h via INTRAVENOUS
  Filled 2020-02-06 (×2): qty 250

## 2020-02-06 MED ORDER — HEPARIN BOLUS VIA INFUSION
1500.0000 [IU] | Freq: Once | INTRAVENOUS | Status: AC
  Administered 2020-02-06: 1500 [IU] via INTRAVENOUS
  Filled 2020-02-06: qty 1500

## 2020-02-06 MED ORDER — ACETAMINOPHEN 650 MG RE SUPP: 650.0000 mg | Freq: Four times a day (QID) | RECTAL | Status: DC | PRN

## 2020-02-06 MED ORDER — PROMETHAZINE HCL 25 MG PO TABS
12.5000 mg | ORAL_TABLET | Freq: Four times a day (QID) | ORAL | Status: DC | PRN
  Administered 2020-02-06: 12.5 mg via ORAL
  Filled 2020-02-06: qty 1

## 2020-02-06 MED ORDER — ACETAMINOPHEN 325 MG PO TABS: 650.0000 mg | ORAL_TABLET | Freq: Four times a day (QID) | ORAL | Status: DC | PRN

## 2020-02-06 MED ORDER — SODIUM CHLORIDE 0.9 % IV SOLN: INTRAVENOUS | Status: DC

## 2020-02-06 MED ORDER — INSULIN ASPART 100 UNIT/ML ~~LOC~~ SOLN
0.0000 [IU] | Freq: Every day | SUBCUTANEOUS | Status: DC
  Administered 2020-02-06 – 2020-02-07 (×2): 2 [IU] via SUBCUTANEOUS

## 2020-02-06 MED ORDER — HYDRALAZINE HCL 25 MG PO TABS
37.5000 mg | ORAL_TABLET | Freq: Three times a day (TID) | ORAL | Status: DC
  Administered 2020-02-06 – 2020-02-08 (×7): 37.5 mg via ORAL
  Filled 2020-02-06 (×7): qty 2

## 2020-02-06 MED ORDER — CARVEDILOL 25 MG PO TABS
25.0000 mg | ORAL_TABLET | Freq: Two times a day (BID) | ORAL | Status: DC
  Administered 2020-02-06 – 2020-02-08 (×5): 25 mg via ORAL
  Filled 2020-02-06 (×5): qty 1

## 2020-02-06 MED ORDER — TOPIRAMATE 25 MG PO TABS: 50.0000 mg | ORAL_TABLET | Freq: Two times a day (BID) | ORAL | Status: DC | PRN

## 2020-02-06 MED ORDER — FENOFIBRATE 54 MG PO TABS
54.0000 mg | ORAL_TABLET | Freq: Every day | ORAL | Status: DC
  Administered 2020-02-06 – 2020-02-07 (×2): 54 mg via ORAL
  Filled 2020-02-06 (×5): qty 1

## 2020-02-06 MED ORDER — INSULIN DETEMIR 100 UNIT/ML ~~LOC~~ SOLN
20.0000 [IU] | Freq: Every day | SUBCUTANEOUS | Status: DC
  Administered 2020-02-06 – 2020-02-07 (×2): 20 [IU] via SUBCUTANEOUS
  Filled 2020-02-06 (×2): qty 0.2

## 2020-02-06 MED ORDER — GLYCERIN (LAXATIVE) 2.1 G RE SUPP
1.0000 | Freq: Every day | RECTAL | Status: DC | PRN
  Filled 2020-02-06: qty 1

## 2020-02-06 MED ORDER — AMIODARONE HCL 200 MG PO TABS
200.0000 mg | ORAL_TABLET | Freq: Two times a day (BID) | ORAL | Status: DC
  Administered 2020-02-06 – 2020-02-08 (×5): 200 mg via ORAL
  Filled 2020-02-06 (×5): qty 1

## 2020-02-06 MED ORDER — HYDRALAZINE HCL 20 MG/ML IJ SOLN
10.0000 mg | Freq: Four times a day (QID) | INTRAMUSCULAR | Status: DC | PRN
  Administered 2020-02-06: 10 mg via INTRAVENOUS
  Filled 2020-02-06: qty 1

## 2020-02-06 MED ORDER — INSULIN ASPART 100 UNIT/ML ~~LOC~~ SOLN
0.0000 [IU] | Freq: Three times a day (TID) | SUBCUTANEOUS | Status: DC
  Administered 2020-02-06: 5 [IU] via SUBCUTANEOUS
  Administered 2020-02-06: 2 [IU] via SUBCUTANEOUS
  Administered 2020-02-06: 3 [IU] via SUBCUTANEOUS
  Administered 2020-02-07: 11 [IU] via SUBCUTANEOUS
  Administered 2020-02-08: 3 [IU] via SUBCUTANEOUS

## 2020-02-06 MED ORDER — TRAZODONE HCL 100 MG PO TABS
100.0000 mg | ORAL_TABLET | Freq: Every day | ORAL | Status: DC
  Administered 2020-02-06 – 2020-02-07 (×2): 100 mg via ORAL
  Filled 2020-02-06 (×2): qty 1

## 2020-02-06 MED ORDER — ICOSAPENT ETHYL 1 G PO CAPS
2.0000 g | ORAL_CAPSULE | Freq: Two times a day (BID) | ORAL | Status: DC
  Administered 2020-02-06 – 2020-02-07 (×4): 2 g via ORAL
  Filled 2020-02-06 (×7): qty 2

## 2020-02-06 NOTE — Progress Notes (Signed)
Patient seen and examined this morning, admitted overnight, H&P reviewed and agree with the assessment and plan.  49 year old male with deafness, vitamin D deficiency, recurrent pancreatitis, nonischemic cardiomyopathy with chronic systolic CHF, hypertension, hyperlipidemia, history of DVT/PEs, DM 2, AICD who came to the hospital on 10/27 with abdominal pain and distention, along with shortness of breath.  He was recently hospitalized and discharged on 10/26, hospital course was pertinent for black stools, hematemesis and at that time anticoagulation has been on hold, he was supposed to resume today following discharge, A. fib with RVR now on amiodarone and converted back to sinus rhythm.  Patient also reports currently that has not had a bowel movement since being discharged.  In the ED underwent a CT scan of the chest, abdomen and pelvis which showed PE as well as increased stool burden on abdominal CT.  His lipase was also found to be elevated however the pancreas was unremarkable on imaging.   Acute bilateral DVTs, acute PE-CT angiogram showed nonocclusive thrombus within the right main pulmonary artery and partially occlusive thrombus in the right upper lobe and posterior lower lobe subsegmental arteries.  DVT ultrasound on 10/28 shows bilateral DVTs.  Patient was on chronic Eliquis but he was off of that due to GI bleed, was supposed to resume.  Currently on heparin infusion, monitor closely, if there is no bleeding over the next 24 hours we will switch back to Eliquis  Acute on chronic pancreatitis-lipase is elevated however he does not have any significant CT findings of inflammatory changes within the pancreas.  Conservative management for now.  Allow clears today.  Hypertensive crisis-blood pressure in the 200s in the ED, still elevated this morning, resume home medications, continue hydralazine, carvedilol, will uptitrate as needed  Hyperglycemia in the setting of type 2 diabetes  mellitus-continue Lantus, sliding scale, last A1c was 11 about during prior hospital stay.  Chronic systolic and diastolic CHF, nonischemic cardiomyopathy, AICD in place-he has slight fluid overload with upper extremity swelling, discontinue IV fluids today.  He is on room air and does not have much lower extremity swelling.  A 2D echo is pending. -He was taken off Entresto and Lasix by cardiology during prior hospital stay  History of A. fib with RVR, NSVT-seen by cardiology during prior hospital stay, currently on amiodarone and remains in sinus.  Continue anticoagulation.  Recent acute GI bleed with hematemesis, during prior hospital stay-GI consulted, EGD showed LA grade B esophagitis, gastritis, normal duodenal bulb, with bleeding thought to be due to esophagitis and gastritis without endoscopic finding for high risk of rebleeding.  Chronic kidney disease stage IIIb -Baseline creatinine ranging up to 1.7 as an outpatient, currently at baseline at 1.7.  Monitor.  Abdominal pain-possibly combination of mild pancreatitis versus increased important.  Aggressive bowel regimen   Scheduled Meds: . amiodarone  200 mg Oral BID  . atorvastatin  80 mg Oral QHS  . carvedilol  25 mg Oral BID WC  . fenofibrate  54 mg Oral Daily  . hydrALAZINE  37.5 mg Oral Q8H  . icosapent Ethyl  2 g Oral BID  . insulin aspart  0-15 Units Subcutaneous TID WC  . insulin aspart  0-5 Units Subcutaneous QHS  . insulin detemir  20 Units Subcutaneous QHS  . pantoprazole  40 mg Oral BID  . polyethylene glycol  17 g Oral Daily  . senna-docusate  2 tablet Oral BID  . traZODone  100 mg Oral QHS   Continuous Infusions: . heparin 1,500  Units/hr (02/06/20 0600)   PRN Meds:.acetaminophen **OR** acetaminophen, citalopram, Glycerin (Adult), hydrALAZINE, morphine injection, promethazine, topiramate  Maddex Garlitz M. Cruzita Lederer, MD, PhD [facility]  Between 7 am - 7 pm you can contact me via [place] or [place].  I am not  available 7 pm - 7 am, please contact night coverage MD/APP via [place]

## 2020-02-06 NOTE — Plan of Care (Signed)
PT admitted with right sided P.E. x 2- oxygen prn - fluids - heparin at 15 to begin- condomn cath to reduce movement and medications for pain and nausea

## 2020-02-06 NOTE — Progress Notes (Signed)
Bilateral lower extremity venous duplex has been completed. Preliminary results can be found in CV Proc through chart review.  Results were given to the patient's nurse, Levada Dy.  02/06/20 9:30 AM Troy Rush RVT

## 2020-02-06 NOTE — TOC Initial Note (Signed)
Transition of Care [facility]) - Initial/Assessment Note    Patient Details  Name: Troy Rush MRN: [id number] Date of Birth: 08-25-1970  Transition of Care [facility]) CM/SW Contact:    Dessa Phi, RN Phone Number: 02/06/2020, 3:57 PM  Clinical Narrative:  Patient is deaf-used telephonic interpreter ASL service-patient's d/c plan home,has pharmacy,pcp,own transport,family support. No CM needs.                   Barriers to Discharge: Continued Medical Work up   Patient Goals and CMS Choice Patient states their goals for this hospitalization and ongoing recovery are:: go home CMS Medicare.gov Compare Post Acute Care list provided to:: Patient Choice offered to / list presented to : Patient  Expected Discharge Plan and Services     Discharge Planning Services: CM Consult Post Acute Care Choice: Resumption of Svcs/PTA Provider Living arrangements for the past 2 months: Apartment                                      Prior Living Arrangements/Services Living arrangements for the past 2 months: Apartment Lives with:: Roommate Patient language and need for interpreter reviewed:: Yes Do you feel safe going back to the place where you live?: Yes      Need for Family Participation in Patient Care: No (Comment) Care giver support system in place?: Yes (comment)   Criminal Activity/Legal Involvement Pertinent to Current Situation/Hospitalization: No - Comment as needed  Activities of Daily Living Home Assistive Devices/Equipment: None ADL Screening (condition at time of admission) Patient's cognitive ability adequate to safely complete daily activities?: Yes Is the patient deaf or have difficulty hearing?: Yes Does the patient have difficulty seeing, even when wearing glasses/contacts?: No Does the patient have difficulty concentrating, remembering, or making decisions?: No Patient able to express need for assistance with ADLs?: Yes Does the patient have difficulty dressing  or bathing?: No Independently performs ADLs?: Yes (appropriate for developmental age) Does the patient have difficulty walking or climbing stairs?: No Weakness of Legs: None Weakness of Arms/Hands: None  Permission Sought/Granted Permission sought to share information with : Case Manager Permission granted to share information with : Yes, Verbal Permission Granted  Share Information with NAME: Case manager           Emotional Assessment Appearance:: Appears stated age Attitude/Demeanor/Rapport: Gracious Affect (typically observed): Accepting Orientation: : Oriented to Self, Oriented to Place, Oriented to  Time, Oriented to Situation Alcohol / Substance Use: Not Applicable Psych Involvement: No (comment)  Admission diagnosis:  Pulmonary embolism (HCC) [I26.99] Acute pancreatitis, unspecified complication status, unspecified pancreatitis type [K85.90] Multiple subsegmental pulmonary emboli without acute cor pulmonale (HCC) [I26.94] Patient Active Problem List   Diagnosis Date Noted  . Pulmonary embolism ([place]) 02/05/2020  . Acute kidney injury superimposed on CKD ([place])   . Chronic combined systolic and diastolic heart failure ([place])   . Atrial fibrillation with RVR ([place]) 02/01/2020  . NSVT (nonsustained ventricular tachycardia) ([place]) 02/01/2020  . Acute GI bleeding 02/01/2020  . DKA (diabetic ketoacidosis) ([place]) 02/01/2020  . Bilateral deafness 08/15/2019  . Type 2 diabetes mellitus with complication, with long-term current use of insulin ([place]) 08/15/2019  . Hemoglobin A1C between 7% and 9% indicating borderline diabetic control 08/15/2019  . Insomnia 08/15/2019  . Abdominal pain 11/14/2018  . Ischemic cardiomyopathy 10/14/2016  . NICM (nonischemic cardiomyopathy) ([place]) 09/15/2016  . Chronic systolic CHF (congestive heart failure) ([place])  05/30/2016  . HTN (hypertension) 05/30/2016  . Deaf 05/30/2016  . Hyperkalemia 05/30/2016  . Diabetes ([place]) 03/21/2016  . Major depressive  disorder, recurrent episode ([place]) 03/21/2016   PCP:  [name], FNP Pharmacy:   [facility] [place], [address] [place] [address] Phone: [phone] Fax: [phone]     Social Determinants of Health (SDOH) Interventions    Readmission Risk Interventions Readmission Risk Prevention Plan 02/06/2020  PCP or Specialist Appt within 3-5 Days Complete  HRI or [place] Complete  Social Work Consult for [place] Planning/Counseling Complete  Palliative Care Screening Not Applicable  Medication Review Press photographer) Complete  Some recent data might be hidden

## 2020-02-06 NOTE — Progress Notes (Signed)
ED TO INPATIENT HANDOFF REPORT  ED Nurse Name and Phone #: Simeon Vera [phone]   Name/Age/Gender Troy Rush 49 y.o. male Room/Bed: WA07/WA07  Code Status   Code Status: Prior  Home/SNF/Other Home Patient oriented to: self, place, time and situation Is this baseline? Yes   Triage Complete: Triage complete  Chief Complaint Pulmonary embolism ([place]) [I26.99]  Triage Note Pt arrived via walk in, c/o left leg pain and abd distention. Was recently discharged for DKA and A fib rvr, states he felt better for a couple hours and then started to feel "sick" again.    All information obtained using translation services via [place]    Allergies Allergies  Allergen Reactions  . Lisinopril Cough    Level of Care/Admitting Diagnosis ED Disposition    ED Disposition Condition Comment   Admit  Hospital Area: [facility] [[id number]]  Level of Care: Telemetry [5]  Admit to tele based on following criteria: Other see comments  Comments: new PE  May [facility] patient to [facility] or [facility] if equivalent level of care is available:: Yes  Covid Evaluation: Confirmed COVID Negative  Diagnosis: Pulmonary embolism [facility]) [[id number]]  Admitting Physician: Rolla Plate [[id number]]  Attending Physician: Rolla Plate [[id number]]  Estimated length of stay: past midnight tomorrow  Certification:: I certify this patient will need inpatient services for at least 2 midnights       B Medical/Surgery History Past Medical History:  Diagnosis Date  . AICD (automatic cardioverter/defibrillator) present 10/14/2016  . Bilateral deafness   . Bronchitis   . CHF (congestive heart failure) ([place])   . Depression   . Diabetes mellitus without complication ([place])   . DVT, lower extremity ([place]) 07/12/2019  . Dyslipidemia   . Headache 08/2019  . Hypertension   . Insomnia 08/2019  . NICM (nonischemic cardiomyopathy) ([place]) 09/15/2016  . Pancreatitis   . Vitamin D deficiency  08/2019   Past Surgical History:  Procedure Laterality Date  . BACK SURGERY    . CARDIAC CATHETERIZATION    . ESOPHAGOGASTRODUODENOSCOPY (EGD) WITH PROPOFOL N/A 02/02/2020   Procedure: ESOPHAGOGASTRODUODENOSCOPY (EGD) WITH PROPOFOL;  Surgeon: Arta Silence, MD;  Location: [facility] ENDOSCOPY;  Service: Endoscopy;  Laterality: N/A;  . ICD IMPLANT  10/14/2016  . ICD IMPLANT N/A 10/14/2016   Procedure: ICD Implant;  Surgeon: Deboraha Sprang, MD;  Location: [place] CV LAB;  Service: Cardiovascular;  Laterality: N/A;  . WRIST SURGERY       A IV Location/Drains/Wounds Patient Lines/Drains/Airways Status    Active Line/Drains/Airways    Name Placement date Placement time Site Days   Peripheral IV 02/05/20 Right Hand 02/05/20  2030  Hand  1   Peripheral IV 02/05/20 Right Antecubital 02/05/20  2048  Antecubital  1   Incision (Closed) 10/14/16 Chest Left;Upper 10/14/16  1406   1210          Intake/Output Last 24 hours  Intake/Output Summary (Last 24 hours) at 02/06/2020 0239 Last data filed at 02/06/2020 0235 Gross per 24 hour  Intake 500 ml  Output 400 ml  Net 100 ml    Labs/Imaging Results for orders placed or performed during the hospital encounter of 02/05/20 (from the past 48 hour(s))  CBG monitoring, ED     Status: Abnormal   Collection Time: 02/05/20  6:04 PM  Result Value Ref Range   Glucose-Capillary 346 (H) 70 - 99 mg/dL    Comment: Glucose reference range applies only to samples taken after fasting for  at least 8 hours.  Urinalysis, Routine w reflex microscopic Urine, Clean Catch     Status: Abnormal   Collection Time: 02/05/20  7:28 PM  Result Value Ref Range   Color, Urine STRAW (A) YELLOW   APPearance CLEAR CLEAR   Specific Gravity, Urine 1.010 1.005 - 1.030   pH 7.0 5.0 - 8.0   Glucose, UA >=500 (A) NEGATIVE mg/dL   Hgb urine dipstick SMALL (A) NEGATIVE   Bilirubin Urine NEGATIVE NEGATIVE   Ketones, ur NEGATIVE NEGATIVE mg/dL   Protein, ur 100 (A) NEGATIVE  mg/dL   Nitrite NEGATIVE NEGATIVE   Leukocytes,Ua NEGATIVE NEGATIVE   RBC / HPF 0-5 0 - 5 RBC/hpf   WBC, UA 0-5 0 - 5 WBC/hpf   Bacteria, UA NONE SEEN NONE SEEN    Comment: Performed at [facility], [place] [address] [place], [place] [address]  CBC with Differential     Status: Abnormal   Collection Time: 02/05/20  8:35 PM  Result Value Ref Range   WBC 5.3 4.0 - 10.5 K/uL   RBC 3.21 (L) 4.22 - 5.81 MIL/uL   Hemoglobin 9.5 (L) 13.0 - 17.0 g/dL   HCT 30.6 (L) 39 - 52 %   MCV 95.3 80.0 - 100.0 fL   MCH 29.6 26.0 - 34.0 pg   MCHC 31.0 30.0 - 36.0 g/dL   RDW 12.3 11.5 - 15.5 %   Platelets 179 150 - 400 K/uL   nRBC 0.0 0.0 - 0.2 %   Neutrophils Relative % 65 %   Neutro Abs 3.4 1.7 - 7.7 K/uL   Lymphocytes Relative 17 %   Lymphs Abs 0.9 0.7 - 4.0 K/uL   Monocytes Relative 14 %   Monocytes Absolute 0.7 0.1 - 1.0 K/uL   Eosinophils Relative 2 %   Eosinophils Absolute 0.1 0.0 - 0.5 K/uL   Basophils Relative 1 %   Basophils Absolute 0.0 0.0 - 0.1 K/uL   Immature Granulocytes 1 %   Abs Immature Granulocytes 0.05 0.00 - 0.07 K/uL    Comment: Performed at [facility], [place] [address] [place], [place] [address]  Comprehensive metabolic panel     Status: Abnormal   Collection Time: 02/05/20  8:35 PM  Result Value Ref Range   Sodium 142 135 - 145 mmol/L   Potassium 4.7 3.5 - 5.1 mmol/L    Comment: DELTA CHECK NOTED NO VISIBLE HEMOLYSIS    Chloride 102 98 - 111 mmol/L   CO2 25 22 - 32 mmol/L   Glucose, Bld 298 (H) 70 - 99 mg/dL    Comment: Glucose reference range applies only to samples taken after fasting for at least 8 hours.   BUN 24 (H) 6 - 20 mg/dL   Creatinine, Ser 1.97 (H) 0.61 - 1.24 mg/dL   Calcium 9.2 8.9 - 10.3 mg/dL   Total Protein 7.5 6.5 - 8.1 g/dL   Albumin 3.5 3.5 - 5.0 g/dL   AST 24 15 - 41 U/L   ALT 16 0 - 44 U/L   Alkaline Phosphatase 50 38 - 126 U/L   Total Bilirubin 1.0 0.3 - 1.2 mg/dL   GFR, Estimated 41 (L) >60 mL/min     Comment: (NOTE) Calculated using the CKD-EPI Creatinine Equation (2021)    Anion gap 15 5 - 15    Comment: Performed at [facility], [place] [address], [place], [place] [address]  Lipase, blood     Status: Abnormal   Collection Time:  02/05/20  8:35 PM  Result Value Ref Range   Lipase 279 (H) 11 - 51 U/L    Comment: Performed at [facility], [place] [address], [place], [place] [address]  Troponin I (High Sensitivity)     Status: Abnormal   Collection Time: 02/05/20  8:35 PM  Result Value Ref Range   Troponin I (High Sensitivity) 65 (H) <18 ng/L    Comment: (NOTE) Elevated high sensitivity troponin I (hsTnI) values and significant  changes across serial measurements may suggest ACS but many other  chronic and acute conditions are known to elevate hsTnI results.  Refer to the "Links" section for chest pain algorithms and additional  guidance. Performed at [facility], [place] [address] [place], [place] [address]   Troponin I (High Sensitivity)     Status: Abnormal   Collection Time: 02/05/20 10:32 PM  Result Value Ref Range   Troponin I (High Sensitivity) 66 (H) <18 ng/L    Comment: (NOTE) Elevated high sensitivity troponin I (hsTnI) values and significant  changes across serial measurements may suggest ACS but many other  chronic and acute conditions are known to elevate hsTnI results.  Refer to the "Links" section for chest pain algorithms and additional  guidance. Performed at [facility], [place] [address], [place], [place] [address]   Respiratory Panel by RT PCR (Flu A&B, Covid) - Nasopharyngeal Swab     Status: None   Collection Time: 02/05/20 10:32 PM   Specimen: Nasopharyngeal Swab  Result Value Ref Range   SARS Coronavirus 2 by RT PCR NEGATIVE NEGATIVE    Comment: (NOTE) SARS-CoV-2 target nucleic acids are NOT DETECTED.  The SARS-CoV-2 RNA is generally detectable in upper respiratoy specimens  during the acute phase of infection. The lowest concentration of SARS-CoV-2 viral copies this assay can detect is 131 copies/mL. A negative result does not preclude SARS-Cov-2 infection and should not be used as the sole basis for treatment or other patient management decisions. A negative result may occur with  improper specimen collection/handling, submission of specimen other than nasopharyngeal swab, presence of viral mutation(s) within the areas targeted by this assay, and inadequate number of viral copies (<131 copies/mL). A negative result must be combined with clinical observations, patient history, and epidemiological information. The expected result is Negative.  Fact Sheet for Patients:  PinkCheek.be  Fact Sheet for Healthcare Providers:  GravelBags.it  This test is no t yet approved or cleared by the [place] FDA and  has been authorized for detection and/or diagnosis of SARS-CoV-2 by FDA under an Emergency Use Authorization (EUA). This EUA will remain  in effect (meaning this test can be used) for the duration of the COVID-19 declaration under Section 564(b)(1) of the Act, 21 U.S.C. section 360bbb-3(b)(1), unless the authorization is terminated or revoked sooner.     Influenza A by PCR NEGATIVE NEGATIVE   Influenza B by PCR NEGATIVE NEGATIVE    Comment: (NOTE) The Xpert Xpress SARS-CoV-2/FLU/RSV assay is intended as an aid in  the diagnosis of influenza from Nasopharyngeal swab specimens and  should not be used as a sole basis for treatment. Nasal washings and  aspirates are unacceptable for Xpert Xpress SARS-CoV-2/FLU/RSV  testing.  Fact Sheet for Patients: PinkCheek.be  Fact Sheet for Healthcare Providers: GravelBags.it  This test is not yet approved or cleared by the [place] FDA and  has been authorized for detection and/or diagnosis  of SARS-CoV-2 by  FDA under an Emergency Use Authorization (  EUA). This EUA will remain  in effect (meaning this test can be used) for the duration of the  Covid-19 declaration under Section 564(b)(1) of the Act, 21  U.S.C. section 360bbb-3(b)(1), unless the authorization is  terminated or revoked. Performed at [facility], [place] [address], [place], [place] [address]   POC occult blood, ED     Status: None   Collection Time: 02/05/20 10:57 PM  Result Value Ref Range   Fecal Occult Bld NEGATIVE NEGATIVE  Heparin level (unfractionated)     Status: Abnormal   Collection Time: 02/06/20 12:06 AM  Result Value Ref Range   Heparin Unfractionated <0.10 (L) 0.30 - 0.70 IU/mL    Comment: (NOTE) If heparin results are below expected values, and patient dosage has  been confirmed, suggest follow up testing of antithrombin III levels. Performed at [facility], [place] [address], [place], [place] [address]   APTT     Status: None   Collection Time: 02/06/20 12:06 AM  Result Value Ref Range   aPTT 30 24 - 36 seconds    Comment: Performed at [facility], [place] [address], [place], [place] [address]  Protime-INR     Status: None   Collection Time: 02/06/20 12:06 AM  Result Value Ref Range   Prothrombin Time 12.8 11.4 - 15.2 seconds   INR 1.0 0.8 - 1.2    Comment: (NOTE) INR goal varies based on device and disease states. Performed at [facility], [place] [address] [place], [place] [address]    CT Angio Chest PE W and/or Wo Contrast  Result Date: 02/05/2020 CLINICAL DATA:  Left leg pain and abdominal distension EXAM: CT ANGIOGRAPHY CHEST WITH CONTRAST TECHNIQUE: Multidetector CT imaging of the chest was performed using the standard protocol during bolus administration of intravenous contrast. Multiplanar CT image reconstructions and MIPs were obtained to evaluate the vascular anatomy. CONTRAST:  42mL OMNIPAQUE IOHEXOL 350  MG/ML SOLN COMPARISON:  None. FINDINGS: Cardiovascular: There is suboptimal opacification of the main pulmonary artery, however there is a small nonocclusive thrombus seen within the right main pulmonary artery with partially occlusive thrombus extending into the posterior right lower and anterior right upper lobe lobe subsegmental arterial branches. There is mild cardiomegaly. The no pericardial effusion or thickening. No evidence right heart strain. There is normal three-vessel brachiocephalic anatomy without proximal stenosis. The thoracic aorta is normal in appearance. Mediastinum/Nodes: No hilar, mediastinal, or axillary adenopathy. Thyroid gland, trachea, and esophagus demonstrate no significant findings. Lungs/Pleura: The lungs are clear. No pleural effusion or pneumothorax. No airspace consolidation. Upper Abdomen: No acute abnormalities present in the visualized portions of the upper abdomen. Musculoskeletal: No chest wall abnormality. No acute or significant osseous findings. Review of the MIP images confirms the above findings. Abdomen/pelvis: Lower chest: The visualized heart size within normal limits. No pericardial fluid/thickening. No hiatal hernia. The visualized portions of the lungs are clear. Hepatobiliary: The liver is normal in density without focal abnormality.The main portal vein is patent. No evidence of calcified gallstones, gallbladder wall thickening or biliary dilatation. Pancreas: Unremarkable. No pancreatic ductal dilatation or surrounding inflammatory changes. Spleen: Normal in size without focal abnormality. Adrenals/Urinary Tract: Both adrenal glands appear normal. The kidneys and collecting system appear normal without evidence of urinary tract calculus or hydronephrosis. Bladder is unremarkable. Stomach/Bowel: The stomach, small bowel, and colon are normal in appearance. No inflammatory changes, wall thickening, or obstructive findings. There is a moderate to large amount of  colonic stool  present.The appendix is normal. Vascular/Lymphatic: There are no enlarged mesenteric, retroperitoneal, or pelvic lymph nodes. No significant vascular findings are present. Reproductive: The prostate is unremarkable. Other: No evidence of abdominal wall mass or hernia. Musculoskeletal: No acute or significant osseous findings. IMPRESSION: 1. Suboptimal opacification of the main pulmonary artery. However there is nonocclusive thrombus seen within the right main pulmonary artery and partially occlusive thrombus in the right upper lobe and posterior lower lobe subsegmental arteries. 2. Minimal amount of ground-glass atelectasis at both lung bases 3. Moderate amount of colonic stool without evidence of obstruction. Electronically Signed   By: Prudencio Pair M.D.   On: 02/05/2020 22:10   CT ABDOMEN PELVIS W CONTRAST  Result Date: 02/05/2020 CLINICAL DATA:  Left leg pain and abdominal distension EXAM: CT ANGIOGRAPHY CHEST WITH CONTRAST TECHNIQUE: Multidetector CT imaging of the chest was performed using the standard protocol during bolus administration of intravenous contrast. Multiplanar CT image reconstructions and MIPs were obtained to evaluate the vascular anatomy. CONTRAST:  43mL OMNIPAQUE IOHEXOL 350 MG/ML SOLN COMPARISON:  None. FINDINGS: Cardiovascular: There is suboptimal opacification of the main pulmonary artery, however there is a small nonocclusive thrombus seen within the right main pulmonary artery with partially occlusive thrombus extending into the posterior right lower and anterior right upper lobe lobe subsegmental arterial branches. There is mild cardiomegaly. The no pericardial effusion or thickening. No evidence right heart strain. There is normal three-vessel brachiocephalic anatomy without proximal stenosis. The thoracic aorta is normal in appearance. Mediastinum/Nodes: No hilar, mediastinal, or axillary adenopathy. Thyroid gland, trachea, and esophagus demonstrate no significant  findings. Lungs/Pleura: The lungs are clear. No pleural effusion or pneumothorax. No airspace consolidation. Upper Abdomen: No acute abnormalities present in the visualized portions of the upper abdomen. Musculoskeletal: No chest wall abnormality. No acute or significant osseous findings. Review of the MIP images confirms the above findings. Abdomen/pelvis: Lower chest: The visualized heart size within normal limits. No pericardial fluid/thickening. No hiatal hernia. The visualized portions of the lungs are clear. Hepatobiliary: The liver is normal in density without focal abnormality.The main portal vein is patent. No evidence of calcified gallstones, gallbladder wall thickening or biliary dilatation. Pancreas: Unremarkable. No pancreatic ductal dilatation or surrounding inflammatory changes. Spleen: Normal in size without focal abnormality. Adrenals/Urinary Tract: Both adrenal glands appear normal. The kidneys and collecting system appear normal without evidence of urinary tract calculus or hydronephrosis. Bladder is unremarkable. Stomach/Bowel: The stomach, small bowel, and colon are normal in appearance. No inflammatory changes, wall thickening, or obstructive findings. There is a moderate to large amount of colonic stool present.The appendix is normal. Vascular/Lymphatic: There are no enlarged mesenteric, retroperitoneal, or pelvic lymph nodes. No significant vascular findings are present. Reproductive: The prostate is unremarkable. Other: No evidence of abdominal wall mass or hernia. Musculoskeletal: No acute or significant osseous findings. IMPRESSION: 1. Suboptimal opacification of the main pulmonary artery. However there is nonocclusive thrombus seen within the right main pulmonary artery and partially occlusive thrombus in the right upper lobe and posterior lower lobe subsegmental arteries. 2. Minimal amount of ground-glass atelectasis at both lung bases 3. Moderate amount of colonic stool without evidence  of obstruction. Electronically Signed   By: Prudencio Pair M.D.   On: 02/05/2020 22:10   DG Abd Acute W/Chest  Result Date: 02/05/2020 CLINICAL DATA:  Chest pain, abdominal pain EXAM: DG ABDOMEN ACUTE WITH 1 VIEW CHEST COMPARISON:  01/31/2020 FINDINGS: No new consolidation or edema. No pleural effusion or pneumothorax. Stable cardiomediastinal contours with cardiomegaly. Left chest  wall ICD is present. Bowel gas pattern is unremarkable. There is no free air. No radiopaque calculi identified. No acute osseous abnormality. IMPRESSION: No significant change since 01/31/2020.  No acute abnormality. Electronically Signed   By: Macy Mis M.D.   On: 02/05/2020 18:57    Pending Labs Unresulted Labs (From admission, onward)          Start     Ordered   02/06/20 0630  Heparin level (unfractionated)  Once-Timed,   STAT        02/06/20 0032   02/06/20 0500  CBC  Daily,   R      02/06/20 0004   Signed and Held  Comprehensive metabolic panel  Tomorrow morning,   R        Signed and Held   Signed and Held  Magnesium  Tomorrow morning,   R        Signed and Held   Signed and Held  CBC WITH DIFFERENTIAL  Tomorrow morning,   R        Signed and Held   Signed and Held  Lipid panel  Tomorrow morning,   R        Signed and Held          Vitals/Pain Today's Vitals   02/06/20 0100 02/06/20 0143 02/06/20 0200 02/06/20 0230  BP: (!) 207/101 (!) 201/100 (!) 199/99 (!) 194/100  Pulse: 75 76 76 83  Resp: 13 10 11 12   Temp:      TempSrc:      SpO2: 98% 100% 100% 91%  Weight:      Height:      PainSc:        Isolation Precautions No active isolations  Medications Medications  heparin bolus via infusion 1,500 Units (1,500 Units Intravenous Bolus from Bag 02/06/20 0019)    Followed by  heparin ADULT infusion 100 units/mL (25000 units/244mL sodium chloride 0.45%) (1,500 Units/hr Intravenous New Bag/Given 02/06/20 0019)  hydrALAZINE (APRESOLINE) injection 10 mg (10 mg Intravenous Given by Other  02/06/20 0124)  fentaNYL (SUBLIMAZE) injection 50 mcg (50 mcg Intravenous Given 02/05/20 2046)  lactated ringers bolus 500 mL (0 mLs Intravenous Stopped 02/05/20 2223)  pantoprazole (PROTONIX) injection 40 mg (40 mg Intravenous Given 02/05/20 2048)  iohexol (OMNIPAQUE) 350 MG/ML injection 75 mL (75 mLs Intravenous Contrast Given 02/05/20 2152)  morphine 4 MG/ML injection 4 mg (4 mg Intravenous Given 02/05/20 2259)    Mobility walks Low fall risk   Focused Assessments     Recommendations: See Admitting Provider Note  Report given to: Danae Chen [phone]  Additional Notes:

## 2020-02-06 NOTE — Progress Notes (Signed)
ANTICOAGULATION CONSULT NOTE - Follow Up Consult  Pharmacy Consult for Heparin Indication: pulmonary embolus  Allergies  Allergen Reactions  . Lisinopril Cough    Patient Measurements: Height: 5\' 10"  (177.8 cm) Weight: 104.5 kg (230 lb 5 oz) IBW/kg (Calculated) : 73 HEPARIN DW (KG): 95.2   Vital Signs: Temp: 98.6 F (37 C) (10/28 0325) Temp Source: Oral (10/28 0325) BP: 195/103 (10/28 0325) Pulse Rate: 85 (10/28 0325)  Labs: Recent Labs    02/04/20 2446 02/04/20 2863 02/05/20 2035 02/05/20 2232 02/06/20 0006 02/06/20 0451 02/06/20 0657  HGB 10.6*   < > 9.5*  --   --  9.3*  --   HCT 33.4*  --  30.6*  --   --  30.2*  --   PLT 171  --  179  --   --  160  --   APTT  --   --   --   --  30  --   --   LABPROT  --   --   --   --  12.8  --   --   INR  --   --   --   --  1.0  --   --   HEPARINUNFRC  --   --   --   --  <0.10*  --  0.46  CREATININE 2.24*  --  1.97*  --   --  1.74*  --   TROPONINIHS  --   --  65* 66*  --   --   --    < > = values in this interval not displayed.    Estimated Creatinine Clearance: 61.5 mL/min (A) (by C-G formula based on SCr of 1.74 mg/dL (H)).   Medications:  Eliquis 5mg  BID- last dose 01/29/20, Baseline heparin level confirms no eliquis on board.  Assessment: 49 yo M with hx VTE  and Afib who is chronically on Eliquis.  He was recently hospitalized 10/22-10/26 with GI Bleeding.  Eliquis was held due to bleeding with GI recommendations to resume on 10/26.  Patient has not yet resumed medication.  Baseline heparin level confirms no eliquis on board. He returned to hospital today feeling worse with LLE & abdominal pain.  Chest CT + for new PE.  02/06/2020   Heparin level therapeutic (0.46) on 1500 units/hr  CBC: Hgb low but stable overnight; Plts WNL  SCr: improved  No bleeding reported  Goal of Therapy:  Heparin level 0.3-0.7 units/ml - **will target lower end of goal range due to recent bleeding** Monitor platelets by  anticoagulation protocol: Yes   Plan:  Continue heparin infusion at 1500 units/hr Check heparin level in 8h to confirm therapeutic Daily heparin level & CBC while on heparin  Peggyann Juba, PharmD, BCPS Pharmacy: [phone] 02/06/2020,7:26 AM

## 2020-02-06 NOTE — Progress Notes (Signed)
  Echocardiogram 2D Echocardiogram has been performed.  Troy Rush 02/06/2020, 3:15 PM

## 2020-02-06 NOTE — H&P (Signed)
TRH H&P    Patient Demographics:    Troy Rush, is a 49 y.o. male  MRN: [id number]  DOB - [birth year]  Admit Date - 02/05/2020  Referring MD/NP/PA: Athena Masse  Outpatient Primary MD for the patient is [name], FNP  Patient coming from: Home  Chief complaint- abdominal pain and dyspnea    HPI:    Troy Rush  is a 49 y.o. male, with history of deafness, vitamin D deficiency, recurrent pancreatitis, nonischemic cardiomyopathy, CHF last echo showing ejection fraction 30 to 35%, hypertension, hyperlipidemia, DVT and PE, diabetes mellitus type 2, AICD placement, who presents to the emergency room with a chief complaint of abdominal pain and distention.  History was obtained through the use of a ACL interpreter on iPad.  The pain is in his lower abdomen and it is severe.  He describes it as a crampy pain that waxes and wanes.  He has not identified any provocative or relieving factors.  Reports the pain is most intense in his lower abdomen.  He has not had any nausea or vomiting, or diarrhea or constipation.  Patient reports that he has not had a bowel movement since he left the hospital on 02/04/2020.  Patient reports that this pain feels like pancreatitis that he has had in the past.  Patient drinks alcohol occasionally socially.  He does not know why he continues to have pancreatitis.  Patient also reports he has had shortness of breath.  Both the abdominal pain and the shortness of breath started this afternoon on 02/05/2020.  He reports that the shortness of breath is worse with exertion and better with rest.  He had some chest.  Associated with it earlier today.  The chest pain was squeezing, in the center of his chest.  There was no radiation.  The chest pain resolved spontaneously per patient but he thinks it may be laying in the bed helped.  Patient reports that he has not been coughing any more than normal.   When he does cough is productive of yellow sputum.  Of note patient recently was discharged from the hospital.  He was in the ICU after presenting for hematemesis and epigastric discomfort.  He had also noted some black stools at that time.  (Patient has not had any black stools since he left the hospital.)  Patient had been on Eliquis for history of PE and paroxysmal atrial fibrillation.  Patient was started on amiodarone drip for A. fib on his last admission and converted to sinus rhythm.   Patient was discharged on amiodarone. He was also in DKA that admission.  Cardiology saw the patient during his last admission and they recommended holding Entresto and Lasix at discharge.   In the ED Temperature 98.4, heart rate 76, respiratory rate 16, blood pressure 200/99, satting at 98% No leukocytosis with a white blood cell count of 5.3, hemoglobin 9.5 CHEM panel is mostly unremarkable with a BUN of 24 and a creatinine of 1.97 -creatinine at baseline Hyperglycemia with a blood glucose of 298 Lipase of 279 Troponins  stable 65 and 66, this is down from 3 days ago at 459 CT of chest abdomen pelvis shows partially occlusive thrombus, right upper lobe and posterior lower lobe subsegmental arteries, moderate colonic stool without evidence of obstruction EKG shows a heart rate of 80, sinus rhythm QTc 502, no acute changes Fentanyl, 500 LR bolus, morphine, Protonix given in the ED Heparin drip started    Review of systems:    In addition to the HPI above,  Review of Systems  Constitutional: Positive for malaise/fatigue. Negative for chills and fever.  HENT: Negative for congestion, sinus pain and sore throat.        Deaf   Eyes: Negative for blurred vision and double vision.  Respiratory: Positive for cough ( chronic cough), sputum production and shortness of breath. Negative for hemoptysis and wheezing.   Cardiovascular: Positive for chest pain. Negative for palpitations and leg swelling.    Gastrointestinal: Positive for abdominal pain. Negative for blood in stool, constipation, diarrhea, nausea and vomiting.  Genitourinary: Negative for dysuria, frequency and urgency.  Musculoskeletal: Negative for myalgias.  Skin: Negative for itching and rash.  Neurological: Negative for dizziness and weakness.  Psychiatric/Behavioral: Negative for substance abuse.     All other systems reviewed and are negative.    Past History of the following :    Past Medical History:  Diagnosis Date  . AICD (automatic cardioverter/defibrillator) present 10/14/2016  . Bilateral deafness   . Bronchitis   . CHF (congestive heart failure) ([place])   . Depression   . Diabetes mellitus without complication ([place])   . DVT, lower extremity ([place]) 07/12/2019  . Dyslipidemia   . Headache 08/2019  . Hypertension   . Insomnia 08/2019  . NICM (nonischemic cardiomyopathy) ([place]) 09/15/2016  . Pancreatitis   . Vitamin D deficiency 08/2019      Past Surgical History:  Procedure Laterality Date  . BACK SURGERY    . CARDIAC CATHETERIZATION    . ESOPHAGOGASTRODUODENOSCOPY (EGD) WITH PROPOFOL N/A 02/02/2020   Procedure: ESOPHAGOGASTRODUODENOSCOPY (EGD) WITH PROPOFOL;  Surgeon: Arta Silence, MD;  Location: [facility];  Service: Endoscopy;  Laterality: N/A;  . ICD IMPLANT  10/14/2016  . ICD IMPLANT N/A 10/14/2016   Procedure: ICD Implant;  Surgeon: Deboraha Sprang, MD;  Location: [facility];  Service: Cardiovascular;  Laterality: N/A;  . WRIST SURGERY        Social History:      Social History   Tobacco Use  . Smoking status: Never Smoker  . Smokeless tobacco: Never Used  Substance Use Topics  . Alcohol use: Yes    Alcohol/week: 1.0 - 2.0 standard drink    Types: 1 - 2 Glasses of wine per week    Comment: rare       Family History :     Family History  Problem Relation Age of Onset  . Hypertension Mother   . Healthy Father   . Stroke Father       Home Medications:    Prior to Admission medications   Medication Sig Start Date End Date Taking? Authorizing Provider  apixaban (ELIQUIS) 5 MG TABS tablet Take 1 tablet (5 mg total) by mouth 2 (two) times daily. 09/06/19  Yes Larey Dresser, MD  atorvastatin (LIPITOR) 80 MG tablet TAKE 1 TABLET(80 MG) BY MOUTH DAILY Patient taking differently: Take 80 mg by mouth at bedtime.  10/01/19  Yes Dorena Dew, FNP  citalopram (CELEXA) 40 MG tablet Take 40 mg by mouth daily  as needed (depression).    Yes [provider]  fenofibrate 54 MG tablet Take 1 tablet (54 mg total) by mouth daily. 12/11/19 03/10/20 Yes Larey Dresser, MD  icosapent Ethyl (VASCEPA) 1 g capsule Take 2 capsules (2 g total) by mouth 2 (two) times daily. 09/11/19  Yes Larey Dresser, MD  isosorbide mononitrate (IMDUR) 30 MG 24 hr tablet Take 1 tablet (30 mg total) by mouth daily. 09/06/19 02/05/20 Yes Larey Dresser, MD  latanoprost (XALATAN) 0.005 % ophthalmic solution Place 1 drop into both eyes at bedtime. 01/20/20  Yes [provider]  topiramate (TOPAMAX) 50 MG tablet TAKE 1 TABLET BY MOUTH TWICE DAILY AS NEEDED FOR HEADACHE Patient taking differently: Take 50 mg by mouth 2 (two) times daily as needed (headache).  10/31/19  Yes [name], FNP  traZODone (DESYREL) 100 MG tablet Take 1 tablet (100 mg total) by mouth at bedtime. 08/13/19  Yes [name], FNP  Vitamin D, Ergocalciferol, (DRISDOL) 1.25 MG (50000 UNIT) CAPS capsule Take 1 capsule (50,000 Units total) by mouth every 7 (seven) days. Patient taking differently: Take 50,000 Units by mouth every Monday.  08/19/19  Yes [name], FNP  amiodarone (PACERONE) 200 MG tablet Take 1 tablet (200 mg total) by mouth 2 (two) times daily for 7 days. 02/04/20 02/11/20  Raiford Noble Latif, DO  carvedilol (COREG) 25 MG tablet Take 1 tablet (25 mg total) by mouth 2 (two) times daily with a meal. 02/04/20   Sheikh, Georgina Quint Latif, DO  hydrALAZINE (APRESOLINE) 25 MG  tablet Take 1.5 tablets (37.5 mg total) by mouth every 8 (eight) hours. 02/04/20   Raiford Noble Latif, DO  insulin glargine (LANTUS) 100 UNIT/ML injection Inject 0.24 mLs (24 Units total) into the skin daily. 02/04/20   Sheikh, Omair Latif, DO  pantoprazole (PROTONIX) 40 MG tablet Take 1 tablet (40 mg total) by mouth 2 (two) times daily. 02/04/20   Kerney Elbe, DO     Allergies:     Allergies  Allergen Reactions  . Lisinopril Cough     Physical Exam:   Vitals  Blood pressure (!) 200/99, pulse 76, temperature 98.4 F (36.9 C), temperature source Oral, resp. rate 16, height 5\' 10"  (1.778 m), weight 104.5 kg, SpO2 98 %.  1.  General: Lying supine in bed in no acute distress  2. Psychiatric: Cooperative, mood and behavior normal for situation  3. Neurologic: Cranial nerves II through XII are grossly intact with no focal deficit on limited exam Moves all 4 extremities voluntarily  4. HEENMT:  Head is atraumatic, normocephalic, pupils reactive to light, neck is supple, mucous membranes are moist, trachea is midline  5. Respiratory : Lungs are clear to auscultation bilaterally with diminished breath sounds in the lower lung fields due to poor effort No wheezing, rhonchi, rales No cyanosis or clubbing  6. Cardiovascular : Heart rate is normal, rhythm is regular, no murmurs rubs or gallops  7. Gastrointestinal:  Abdomen is soft, tender in all 4 quadrants, but most tender midline lower abdomen, mildly distended, without masses palpable  8. Skin:  No acute lesions on limited skin exam  9.Musculoskeletal:  No acute deformity 2+ pitting edema    Data Review:    CBC Recent Labs  Lab 01/31/20 2217 02/01/20 0520 02/01/20 1705 02/02/20 0239 02/03/20 0305 02/04/20 0822 02/05/20 2035  WBC 4.9   < > 8.7 8.0 6.8 7.4 5.3  HGB 13.2   < > 12.0* 11.8* 11.4* 10.6*  9.5*  HCT 41.8   < > 38.7* 37.7* 37.2* 33.4* 30.6*  PLT 270   < > 215 190 191 171 179  MCV 93.7   < >  95.3 95.2 97.6 94.1 95.3  MCH 29.6   < > 29.6 29.8 29.9 29.9 29.6  MCHC 31.6   < > 31.0 31.3 30.6 31.7 31.0  RDW 12.6   < > 12.8 13.1 13.2 12.8 12.3  LYMPHSABS 0.8  --   --  1.2 1.1 1.2 0.9  MONOABS 0.3  --   --  0.9 0.8 1.0 0.7  EOSABS 0.0  --   --  0.0 0.1 0.2 0.1  BASOSABS 0.0  --   --  0.0 0.0 0.0 0.0   < > = values in this interval not displayed.   ------------------------------------------------------------------------------------------------------------------  Results for orders placed or performed during the hospital encounter of 02/05/20 (from the past 48 hour(s))  CBG monitoring, ED     Status: Abnormal   Collection Time: 02/05/20  6:04 PM  Result Value Ref Range   Glucose-Capillary 346 (H) 70 - 99 mg/dL    Comment: Glucose reference range applies only to samples taken after fasting for at least 8 hours.  Urinalysis, Routine w reflex microscopic Urine, Clean Catch     Status: Abnormal   Collection Time: 02/05/20  7:28 PM  Result Value Ref Range   Color, Urine STRAW (A) YELLOW   APPearance CLEAR CLEAR   Specific Gravity, Urine 1.010 1.005 - 1.030   pH 7.0 5.0 - 8.0   Glucose, UA >=500 (A) NEGATIVE mg/dL   Hgb urine dipstick SMALL (A) NEGATIVE   Bilirubin Urine NEGATIVE NEGATIVE   Ketones, ur NEGATIVE NEGATIVE mg/dL   Protein, ur 100 (A) NEGATIVE mg/dL   Nitrite NEGATIVE NEGATIVE   Leukocytes,Ua NEGATIVE NEGATIVE   RBC / HPF 0-5 0 - 5 RBC/hpf   WBC, UA 0-5 0 - 5 WBC/hpf   Bacteria, UA NONE SEEN NONE SEEN    Comment: Performed at [facility], [place] [address], [place], [place] [address]  CBC with Differential     Status: Abnormal   Collection Time: 02/05/20  8:35 PM  Result Value Ref Range   WBC 5.3 4.0 - 10.5 K/uL   RBC 3.21 (L) 4.22 - 5.81 MIL/uL   Hemoglobin 9.5 (L) 13.0 - 17.0 g/dL   HCT 30.6 (L) 39 - 52 %   MCV 95.3 80.0 - 100.0 fL   MCH 29.6 26.0 - 34.0 pg   MCHC 31.0 30.0 - 36.0 g/dL   RDW 12.3 11.5 - 15.5 %   Platelets 179 150 - 400 K/uL    nRBC 0.0 0.0 - 0.2 %   Neutrophils Relative % 65 %   Neutro Abs 3.4 1.7 - 7.7 K/uL   Lymphocytes Relative 17 %   Lymphs Abs 0.9 0.7 - 4.0 K/uL   Monocytes Relative 14 %   Monocytes Absolute 0.7 0.1 - 1.0 K/uL   Eosinophils Relative 2 %   Eosinophils Absolute 0.1 0.0 - 0.5 K/uL   Basophils Relative 1 %   Basophils Absolute 0.0 0.0 - 0.1 K/uL   Immature Granulocytes 1 %   Abs Immature Granulocytes 0.05 0.00 - 0.07 K/uL    Comment: Performed at [facility], [place] [address], [place], [place] [address]  Comprehensive metabolic panel     Status: Abnormal   Collection Time: 02/05/20  8:35 PM  Result Value Ref Range   Sodium 142 135 -  145 mmol/L   Potassium 4.7 3.5 - 5.1 mmol/L    Comment: DELTA CHECK NOTED NO VISIBLE HEMOLYSIS    Chloride 102 98 - 111 mmol/L   CO2 25 22 - 32 mmol/L   Glucose, Bld 298 (H) 70 - 99 mg/dL    Comment: Glucose reference range applies only to samples taken after fasting for at least 8 hours.   BUN 24 (H) 6 - 20 mg/dL   Creatinine, Ser 1.97 (H) 0.61 - 1.24 mg/dL   Calcium 9.2 8.9 - 10.3 mg/dL   Total Protein 7.5 6.5 - 8.1 g/dL   Albumin 3.5 3.5 - 5.0 g/dL   AST 24 15 - 41 U/L   ALT 16 0 - 44 U/L   Alkaline Phosphatase 50 38 - 126 U/L   Total Bilirubin 1.0 0.3 - 1.2 mg/dL   GFR, Estimated 41 (L) >60 mL/min    Comment: (NOTE) Calculated using the CKD-EPI Creatinine Equation (2021)    Anion gap 15 5 - 15    Comment: Performed at [facility], [place] [address], [place], [place] [address]  Lipase, blood     Status: Abnormal   Collection Time: 02/05/20  8:35 PM  Result Value Ref Range   Lipase 279 (H) 11 - 51 U/L    Comment: Performed at [facility], [place] [address], [place], [place] [address]  Troponin I (High Sensitivity)     Status: Abnormal   Collection Time: 02/05/20  8:35 PM  Result Value Ref Range   Troponin I (High Sensitivity) 65 (H) <18 ng/L    Comment: (NOTE) Elevated high sensitivity  troponin I (hsTnI) values and significant  changes across serial measurements may suggest ACS but many other  chronic and acute conditions are known to elevate hsTnI results.  Refer to the "Links" section for chest pain algorithms and additional  guidance. Performed at [facility], [place] [address] [place], [place] [address]   Troponin I (High Sensitivity)     Status: Abnormal   Collection Time: 02/05/20 10:32 PM  Result Value Ref Range   Troponin I (High Sensitivity) 66 (H) <18 ng/L    Comment: (NOTE) Elevated high sensitivity troponin I (hsTnI) values and significant  changes across serial measurements may suggest ACS but many other  chronic and acute conditions are known to elevate hsTnI results.  Refer to the "Links" section for chest pain algorithms and additional  guidance. Performed at [facility], [place] [address] [place], [place] [address]   Respiratory Panel by RT PCR (Flu A&B, Covid) - Nasopharyngeal Swab     Status: None   Collection Time: 02/05/20 10:32 PM   Specimen: Nasopharyngeal Swab  Result Value Ref Range   SARS Coronavirus 2 by RT PCR NEGATIVE NEGATIVE    Comment: (NOTE) SARS-CoV-2 target nucleic acids are NOT DETECTED.  The SARS-CoV-2 RNA is generally detectable in upper respiratoy specimens during the acute phase of infection. The lowest concentration of SARS-CoV-2 viral copies this assay can detect is 131 copies/mL. A negative result does not preclude SARS-Cov-2 infection and should not be used as the sole basis for treatment or other patient management decisions. A negative result may occur with  improper specimen collection/handling, submission of specimen other than nasopharyngeal swab, presence of viral mutation(s) within the areas targeted by this assay, and inadequate number of viral copies (<131 copies/mL). A negative result must be combined with clinical observations, patient history, and epidemiological  information. The expected result  is Negative.  Fact Sheet for Patients:  PinkCheek.be  Fact Sheet for Healthcare Providers:  GravelBags.it  This test is no t yet approved or cleared by the [place] FDA and  has been authorized for detection and/or diagnosis of SARS-CoV-2 by FDA under an Emergency Use Authorization (EUA). This EUA will remain  in effect (meaning this test can be used) for the duration of the COVID-19 declaration under Section 564(b)(1) of the Act, 21 U.S.C. section 360bbb-3(b)(1), unless the authorization is terminated or revoked sooner.     Influenza A by PCR NEGATIVE NEGATIVE   Influenza B by PCR NEGATIVE NEGATIVE    Comment: (NOTE) The Xpert Xpress SARS-CoV-2/FLU/RSV assay is intended as an aid in  the diagnosis of influenza from Nasopharyngeal swab specimens and  should not be used as a sole basis for treatment. Nasal washings and  aspirates are unacceptable for Xpert Xpress SARS-CoV-2/FLU/RSV  testing.  Fact Sheet for Patients: PinkCheek.be  Fact Sheet for Healthcare Providers: GravelBags.it  This test is not yet approved or cleared by the [place] FDA and  has been authorized for detection and/or diagnosis of SARS-CoV-2 by  FDA under an Emergency Use Authorization (EUA). This EUA will remain  in effect (meaning this test can be used) for the duration of the  Covid-19 declaration under Section 564(b)(1) of the Act, 21  U.S.C. section 360bbb-3(b)(1), unless the authorization is  terminated or revoked. Performed at [facility], [place] [address] [place], [place] [address]   POC occult blood, ED     Status: None   Collection Time: 02/05/20 10:57 PM  Result Value Ref Range   Fecal Occult Bld NEGATIVE NEGATIVE    Chemistries  Recent Labs  Lab 01/31/20 2317 02/01/20 0520 02/02/20 0239 02/03/20 0305  02/03/20 1639 02/04/20 0822 02/05/20 2035  NA 138   < > 135 140 132* 136 142  K 5.6*   < > 3.4* 3.7 4.2 3.9 4.7  CL 94*   < > 97* 99 96* 101 102  CO2 21*   < > 23 28 25 25 25   GLUCOSE 381*   < > 176* 111* 363* 150* 298*  BUN 25*   < > 35* 36* 41* 32* 24*  CREATININE 1.86*   < > 2.83* 2.85* 2.65* 2.24* 1.97*  CALCIUM 9.0   < > 8.6* 9.1 8.5* 8.9 9.2  MG 1.8  --  1.9 1.8  --  2.0  --   AST 39  --  25 27  --  25 24  ALT 24  --  19 17  --  16 16  ALKPHOS 59  --  46 47  --  48 50  BILITOT 2.1*  --  1.0 1.2  --  1.3* 1.0   < > = values in this interval not displayed.   ------------------------------------------------------------------------------------------------------------------  ------------------------------------------------------------------------------------------------------------------ GFR: Estimated Creatinine Clearance: 54.3 mL/min (A) (by C-G formula based on SCr of 1.97 mg/dL (H)). Liver Function Tests: Recent Labs  Lab 01/31/20 2317 02/02/20 0239 02/03/20 0305 02/04/20 0822 02/05/20 2035  AST 39 25 27 25 24   ALT 24 19 17 16 16   ALKPHOS 59 46 47 48 50  BILITOT 2.1* 1.0 1.2 1.3* 1.0  PROT 7.5 6.4* 6.4* 6.8 7.5  ALBUMIN 3.5 3.1* 3.2* 3.1* 3.5   Recent Labs  Lab 01/31/20 2317 02/05/20 2035  LIPASE 73* 279*   No results for input(s): AMMONIA in the last 168 hours. Coagulation Profile: No results for input(s): INR,  PROTIME in the last 168 hours. Cardiac Enzymes: No results for input(s): CKTOTAL, CKMB, CKMBINDEX, TROPONINI in the last 168 hours. BNP (last 3 results) No results for input(s): PROBNP in the last 8760 hours. HbA1C: No results for input(s): HGBA1C in the last 72 hours. CBG: Recent Labs  Lab 02/04/20 0317 02/04/20 0333 02/04/20 0737 02/04/20 1208 02/05/20 1804  GLUCAP 61* 101* 151* 160* 346*   Lipid Profile: No results for input(s): CHOL, HDL, LDLCALC, TRIG, CHOLHDL, LDLDIRECT in the last 72 hours. Thyroid Function Tests: No results for  input(s): TSH, T4TOTAL, FREET4, T3FREE, THYROIDAB in the last 72 hours. Anemia Panel: No results for input(s): VITAMINB12, FOLATE, FERRITIN, TIBC, IRON, RETICCTPCT in the last 72 hours.  --------------------------------------------------------------------------------------------------------------- Urine analysis:    Component Value Date/Time   COLORURINE STRAW (A) 02/05/2020 1928   APPEARANCEUR CLEAR 02/05/2020 1928   LABSPEC 1.010 02/05/2020 1928   PHURINE 7.0 02/05/2020 1928   GLUCOSEU >=500 (A) 02/05/2020 1928   HGBUR SMALL (A) 02/05/2020 1928   BILIRUBINUR NEGATIVE 02/05/2020 1928   BILIRUBINUR Negative 08/13/2019 1103   KETONESUR NEGATIVE 02/05/2020 1928   PROTEINUR 100 (A) 02/05/2020 1928   UROBILINOGEN 0.2 08/13/2019 1103   UROBILINOGEN 0.2 05/09/2017 1130   NITRITE NEGATIVE 02/05/2020 1928   LEUKOCYTESUR NEGATIVE 02/05/2020 1928      Imaging Results:    CT Angio Chest PE W and/or Wo Contrast  Result Date: 02/05/2020 CLINICAL DATA:  Left leg pain and abdominal distension EXAM: CT ANGIOGRAPHY CHEST WITH CONTRAST TECHNIQUE: Multidetector CT imaging of the chest was performed using the standard protocol during bolus administration of intravenous contrast. Multiplanar CT image reconstructions and MIPs were obtained to evaluate the vascular anatomy. CONTRAST:  68mL OMNIPAQUE IOHEXOL 350 MG/ML SOLN COMPARISON:  None. FINDINGS: Cardiovascular: There is suboptimal opacification of the main pulmonary artery, however there is a small nonocclusive thrombus seen within the right main pulmonary artery with partially occlusive thrombus extending into the posterior right lower and anterior right upper lobe lobe subsegmental arterial branches. There is mild cardiomegaly. The no pericardial effusion or thickening. No evidence right heart strain. There is normal three-vessel brachiocephalic anatomy without proximal stenosis. The thoracic aorta is normal in appearance. Mediastinum/Nodes: No hilar,  mediastinal, or axillary adenopathy. Thyroid gland, trachea, and esophagus demonstrate no significant findings. Lungs/Pleura: The lungs are clear. No pleural effusion or pneumothorax. No airspace consolidation. Upper Abdomen: No acute abnormalities present in the visualized portions of the upper abdomen. Musculoskeletal: No chest wall abnormality. No acute or significant osseous findings. Review of the MIP images confirms the above findings. Abdomen/pelvis: Lower chest: The visualized heart size within normal limits. No pericardial fluid/thickening. No hiatal hernia. The visualized portions of the lungs are clear. Hepatobiliary: The liver is normal in density without focal abnormality.The main portal vein is patent. No evidence of calcified gallstones, gallbladder wall thickening or biliary dilatation. Pancreas: Unremarkable. No pancreatic ductal dilatation or surrounding inflammatory changes. Spleen: Normal in size without focal abnormality. Adrenals/Urinary Tract: Both adrenal glands appear normal. The kidneys and collecting system appear normal without evidence of urinary tract calculus or hydronephrosis. Bladder is unremarkable. Stomach/Bowel: The stomach, small bowel, and colon are normal in appearance. No inflammatory changes, wall thickening, or obstructive findings. There is a moderate to large amount of colonic stool present.The appendix is normal. Vascular/Lymphatic: There are no enlarged mesenteric, retroperitoneal, or pelvic lymph nodes. No significant vascular findings are present. Reproductive: The prostate is unremarkable. Other: No evidence of abdominal wall mass or hernia. Musculoskeletal: No acute or significant  osseous findings. IMPRESSION: 1. Suboptimal opacification of the main pulmonary artery. However there is nonocclusive thrombus seen within the right main pulmonary artery and partially occlusive thrombus in the right upper lobe and posterior lower lobe subsegmental arteries. 2. Minimal  amount of ground-glass atelectasis at both lung bases 3. Moderate amount of colonic stool without evidence of obstruction. Electronically Signed   By: Prudencio Pair M.D.   On: 02/05/2020 22:10   CT ABDOMEN PELVIS W CONTRAST  Result Date: 02/05/2020 CLINICAL DATA:  Left leg pain and abdominal distension EXAM: CT ANGIOGRAPHY CHEST WITH CONTRAST TECHNIQUE: Multidetector CT imaging of the chest was performed using the standard protocol during bolus administration of intravenous contrast. Multiplanar CT image reconstructions and MIPs were obtained to evaluate the vascular anatomy. CONTRAST:  52mL OMNIPAQUE IOHEXOL 350 MG/ML SOLN COMPARISON:  None. FINDINGS: Cardiovascular: There is suboptimal opacification of the main pulmonary artery, however there is a small nonocclusive thrombus seen within the right main pulmonary artery with partially occlusive thrombus extending into the posterior right lower and anterior right upper lobe lobe subsegmental arterial branches. There is mild cardiomegaly. The no pericardial effusion or thickening. No evidence right heart strain. There is normal three-vessel brachiocephalic anatomy without proximal stenosis. The thoracic aorta is normal in appearance. Mediastinum/Nodes: No hilar, mediastinal, or axillary adenopathy. Thyroid gland, trachea, and esophagus demonstrate no significant findings. Lungs/Pleura: The lungs are clear. No pleural effusion or pneumothorax. No airspace consolidation. Upper Abdomen: No acute abnormalities present in the visualized portions of the upper abdomen. Musculoskeletal: No chest wall abnormality. No acute or significant osseous findings. Review of the MIP images confirms the above findings. Abdomen/pelvis: Lower chest: The visualized heart size within normal limits. No pericardial fluid/thickening. No hiatal hernia. The visualized portions of the lungs are clear. Hepatobiliary: The liver is normal in density without focal abnormality.The main portal vein  is patent. No evidence of calcified gallstones, gallbladder wall thickening or biliary dilatation. Pancreas: Unremarkable. No pancreatic ductal dilatation or surrounding inflammatory changes. Spleen: Normal in size without focal abnormality. Adrenals/Urinary Tract: Both adrenal glands appear normal. The kidneys and collecting system appear normal without evidence of urinary tract calculus or hydronephrosis. Bladder is unremarkable. Stomach/Bowel: The stomach, small bowel, and colon are normal in appearance. No inflammatory changes, wall thickening, or obstructive findings. There is a moderate to large amount of colonic stool present.The appendix is normal. Vascular/Lymphatic: There are no enlarged mesenteric, retroperitoneal, or pelvic lymph nodes. No significant vascular findings are present. Reproductive: The prostate is unremarkable. Other: No evidence of abdominal wall mass or hernia. Musculoskeletal: No acute or significant osseous findings. IMPRESSION: 1. Suboptimal opacification of the main pulmonary artery. However there is nonocclusive thrombus seen within the right main pulmonary artery and partially occlusive thrombus in the right upper lobe and posterior lower lobe subsegmental arteries. 2. Minimal amount of ground-glass atelectasis at both lung bases 3. Moderate amount of colonic stool without evidence of obstruction. Electronically Signed   By: Prudencio Pair M.D.   On: 02/05/2020 22:10   DG Abd Acute W/Chest  Result Date: 02/05/2020 CLINICAL DATA:  Chest pain, abdominal pain EXAM: DG ABDOMEN ACUTE WITH 1 VIEW CHEST COMPARISON:  01/31/2020 FINDINGS: No new consolidation or edema. No pleural effusion or pneumothorax. Stable cardiomediastinal contours with cardiomegaly. Left chest wall ICD is present. Bowel gas pattern is unremarkable. There is no free air. No radiopaque calculi identified. No acute osseous abnormality. IMPRESSION: No significant change since 01/31/2020.  No acute abnormality.  Electronically Signed   By: Malachi Carl  Patel M.D.   On: 02/05/2020 18:57    My personal review of EKG: Rhythm NSR, Rate 80 /min, QTc 502,no Acute ST changes   Assessment & Plan:    Active Problems:   Pulmonary embolism ([name])   1. Pulmonary embolism 1. CTA shows nonocclusive thrombus seen within the right main pulmonary artery and partially occlusive thrombus in the right upper lobe and posterior lower lobe subsegmental arteries 2. Patient with history of DVT and PE 3. Eliquis was held due to GI bleed, and was due to be resumed yesterday 4. Start heparin drip 5. Echo to evaluate for right heart strain 6. Venous ultrasound lower extremities to evaluate for clot burden 7. Monitor on telemetry 2. Pancreatitis recurrent 1. Abdominal pain and lipase 279 2. CT shows unremarkable pancreas 3. No gallstones or acute hepatobiliary abnormality on CT 4. Lipid panel in the a.m. 5. Drinks EtOH occasionally - but has not drank since discharge 6. N.p.o. except for sips with meds 7. Pain control with morphine 8. Continue to monitor 3. Hypertensive crisis 1. Blood pressures in the ED have been in the 200s over 100s 2. Resume home medications 3. Hydralazine IV as needed for high blood pressure 4. Patient is on a low-dose of hydralazine at home with room for potential titration if blood pressure does not improve with pain control 4. Hyperglycemia in the setting of diabetes mellitus type 2 1. 24 units of insulin at night at home 2. Continue 20 units of insulin at night here as patient will be n.p.o. 3. Sliding scale coverage 4. Last hemoglobin A1c was 11, 4 days ago 5. Counseled on diet and the importance of glycemic control 5. Prolonged QT 1. QTC is 502 2. Hold QT prolonging agents when possible 3. Monitor on telemetry 6.    DVT Prophylaxis-   Heparin- SCDs  AM Labs Ordered, also please review Full Orders  Family Communication: No family at bedside  Code Status:  Full  Admission status:  Inpatient :The appropriate admission status for this patient is INPATIENT. Inpatient status is judged to be reasonable and necessary in order to provide the required intensity of service to ensure the patient's safety. The patient's presenting symptoms, physical exam findings, and initial radiographic and laboratory data in the context of their chronic comorbidities is felt to place them at high risk for further clinical deterioration. Furthermore, it is not anticipated that the patient will be medically stable for discharge from the hospital within 2 midnights of admission. The following factors support the admission status of inpatient.     The patient's presenting symptoms include Abdominal pain The worrisome physical exam findings include Abdominal tenderness The initial radiographic and laboratory data are worrisome because of new PE, lipase 279 The chronic co-morbidities include DVT, pancreatitis, CHF, HLD, HTN, DMII       * I certify that at the point of admission it is my clinical judgment that the patient will require inpatient hospital care spanning beyond 2 midnights from the point of admission due to high intensity of service, high risk for further deterioration and high frequency of surveillance required.*  Time spent in minutes : [place]

## 2020-02-06 NOTE — Progress Notes (Signed)
ANTICOAGULATION CONSULT NOTE - Follow Up Consult  Pharmacy Consult for Heparin Indication: pulmonary embolus  Allergies  Allergen Reactions  . Lisinopril Cough    Patient Measurements: Height: 5\' 10"  (177.8 cm) Weight: 104.5 kg (230 lb 5 oz) IBW/kg (Calculated) : 73 HEPARIN DW (KG): 95.2   Vital Signs: Temp: 98.2 F (36.8 C) (10/28 1546) Temp Source: Oral (10/28 1546) BP: 173/104 (10/28 1546) Pulse Rate: 70 (10/28 1546)  Labs: Recent Labs    02/04/20 9470 02/04/20 9628 02/05/20 2035 02/05/20 2232 02/06/20 0006 02/06/20 0451 02/06/20 0657 02/06/20 1532  HGB 10.6*   < > 9.5*  --   --  9.3*  --   --   HCT 33.4*  --  30.6*  --   --  30.2*  --   --   PLT 171  --  179  --   --  160  --   --   APTT  --   --   --   --  30  --   --   --   LABPROT  --   --   --   --  12.8  --   --   --   INR  --   --   --   --  1.0  --   --   --   HEPARINUNFRC  --   --   --   --  <0.10*  --  0.46 0.46  CREATININE 2.24*  --  1.97*  --   --  1.74*  --   --   TROPONINIHS  --   --  65* 66*  --   --   --   --    < > = values in this interval not displayed.    Estimated Creatinine Clearance: 61.5 mL/min (A) (by C-G formula based on SCr of 1.74 mg/dL (H)).   Medications:  Eliquis 5mg  BID- last dose 01/29/20, Baseline heparin level confirms no eliquis on board.  Assessment: 49 yo M with hx VTE  and Afib who is chronically on Eliquis.  He was recently hospitalized 10/22-10/26 with GI Bleeding.  Eliquis was held due to bleeding with GI recommendations to resume on 10/26.  Patient has not yet resumed medication.  Baseline heparin level confirms no eliquis on board. He returned to hospital today feeling worse with LLE & abdominal pain.  Chest CT + for new PE.  02/06/2020   Heparin level therapeutic (0.46) on 1500 units/hr  CBC: Hgb low but stable overnight; Plts WNL  SCr: improved  No bleeding reported  2nd shift follow up:  Repeat Heparin level therapeutic (0.46) with heparin infusing  @ 1500 units/hr  No complications of therapy noted  Goal of Therapy:  Heparin level 0.3-0.7 units/ml - **will target lower end of goal range due to recent bleeding** Monitor platelets by anticoagulation protocol: Yes   Plan:  Continue heparin infusion at 1500 units/hr Daily heparin level & CBC while on heparin  Leone Haven, PharmD 02/06/2020,4:08 PM

## 2020-02-07 LAB — CBC
HCT: 29 % — ABNORMAL LOW (ref 39.0–52.0)
Hemoglobin: 9.2 g/dL — ABNORMAL LOW (ref 13.0–17.0)
MCH: 30 pg (ref 26.0–34.0)
MCHC: 31.7 g/dL (ref 30.0–36.0)
MCV: 94.5 fL (ref 80.0–100.0)
Platelets: 187 10*3/uL (ref 150–400)
RBC: 3.07 MIL/uL — ABNORMAL LOW (ref 4.22–5.81)
RDW: 12.7 % (ref 11.5–15.5)
WBC: 4.9 10*3/uL (ref 4.0–10.5)
nRBC: 0 % (ref 0.0–0.2)

## 2020-02-07 LAB — COMPREHENSIVE METABOLIC PANEL
ALT: 12 U/L (ref 0–44)
AST: 16 U/L (ref 15–41)
Albumin: 2.8 g/dL — ABNORMAL LOW (ref 3.5–5.0)
Alkaline Phosphatase: 38 U/L (ref 38–126)
Anion gap: 10 (ref 5–15)
BUN: 13 mg/dL (ref 6–20)
CO2: 24 mmol/L (ref 22–32)
Calcium: 8.6 mg/dL — ABNORMAL LOW (ref 8.9–10.3)
Chloride: 99 mmol/L (ref 98–111)
Creatinine, Ser: 1.73 mg/dL — ABNORMAL HIGH (ref 0.61–1.24)
GFR, Estimated: 47 mL/min — ABNORMAL LOW (ref >60)
Glucose, Bld: 71 mg/dL (ref 70–99)
Potassium: 3.8 mmol/L (ref 3.5–5.1)
Sodium: 133 mmol/L — ABNORMAL LOW (ref 135–145)
Total Bilirubin: 0.8 mg/dL (ref 0.3–1.2)
Total Protein: 6.1 g/dL — ABNORMAL LOW (ref 6.5–8.1)

## 2020-02-07 LAB — GLUCOSE, CAPILLARY
Glucose-Capillary: 76 mg/dL (ref 70–99)
Glucose-Capillary: 102 mg/dL — ABNORMAL HIGH (ref 70–99)
Glucose-Capillary: 91 mg/dL (ref 70–99)
Glucose-Capillary: 307 mg/dL — ABNORMAL HIGH (ref 70–99)
Glucose-Capillary: 316 mg/dL — ABNORMAL HIGH (ref 70–99)
Glucose-Capillary: 226 mg/dL — ABNORMAL HIGH (ref 70–99)

## 2020-02-07 LAB — HEPARIN LEVEL (UNFRACTIONATED): Heparin Unfractionated: 0.44 IU/mL (ref 0.30–0.70)

## 2020-02-07 MED ORDER — FUROSEMIDE 40 MG PO TABS
40.0000 mg | ORAL_TABLET | Freq: Every day | ORAL | Status: DC
  Administered 2020-02-07 – 2020-02-08 (×2): 40 mg via ORAL
  Filled 2020-02-07 (×2): qty 1

## 2020-02-07 MED ORDER — APIXABAN 5 MG PO TABS
10.0000 mg | ORAL_TABLET | Freq: Two times a day (BID) | ORAL | Status: DC
  Administered 2020-02-07 – 2020-02-08 (×3): 10 mg via ORAL
  Filled 2020-02-07 (×3): qty 2

## 2020-02-07 MED ORDER — APIXABAN 5 MG PO TABS: 5.0000 mg | ORAL_TABLET | Freq: Two times a day (BID) | ORAL | Status: DC

## 2020-02-07 NOTE — Progress Notes (Signed)
Pt did not receive CBG coverage of 307 do to eating before getting the CBG checked, MD notified face to face.

## 2020-02-07 NOTE — Plan of Care (Signed)
Pt is alert and oriented times 4. Pt ambulated in the hallway with issues. Pt heparin was discontinued and eliquis giving, today. No c/o pain or discomfort this shift. No s/s of sepsis this shift.  Problem: Education: Goal: Knowledge of General Education information will improve Description: Including pain rating scale, medication(s)/side effects and non-pharmacologic comfort measures Outcome: Progressing   Problem: Health Behavior/Discharge Planning: Goal: Ability to manage health-related needs will improve Outcome: Progressing   Problem: Clinical Measurements: Goal: Ability to maintain clinical measurements within normal limits will improve Outcome: Progressing Goal: Will remain free from infection Outcome: Progressing Goal: Diagnostic test results will improve Outcome: Progressing Goal: Respiratory complications will improve Outcome: Progressing Goal: Cardiovascular complication will be avoided Outcome: Progressing   Problem: Activity: Goal: Risk for activity intolerance will decrease Outcome: Progressing   Problem: Nutrition: Goal: Adequate nutrition will be maintained Outcome: Progressing   Problem: Coping: Goal: Level of anxiety will decrease Outcome: Progressing   Problem: Elimination: Goal: Will not experience complications related to bowel motility Outcome: Progressing Goal: Will not experience complications related to urinary retention Outcome: Progressing   Problem: Pain Managment: Goal: General experience of comfort will improve Outcome: Progressing   Problem: Safety: Goal: Ability to remain free from injury will improve Outcome: Progressing   Problem: Skin Integrity: Goal: Risk for impaired skin integrity will decrease Outcome: Progressing   Problem: Consults Goal: Venous Thromboembolism Patient Education Description: See Patient Education Module for education specifics. Outcome: Progressing Goal: Diagnosis - Venous Thromboembolism  (VTE) Description: Choose a selection Outcome: Progressing Goal: Pharmacy Consult for anticoagulation Outcome: Progressing Goal: Skin Care Protocol Initiated - if Braden Score 18 or less Description: If consults are not indicated, leave blank or document N/A Outcome: Progressing Goal: Nutrition Consult-if indicated Outcome: Progressing Goal: Diabetes Guidelines if Diabetic/Glucose > 140 Description: If diabetic or lab glucose is > 140 mg/dl - Initiate Diabetes/Hyperglycemia Guidelines & Document Interventions  Outcome: Progressing   Problem: Phase I Progression Outcomes Goal: Pain controlled with appropriate interventions Outcome: Progressing Goal: Dyspnea controlled at rest (PE) Outcome: Progressing Goal: Tolerating diet Outcome: Progressing Goal: Initial discharge plan identified Outcome: Progressing Goal: Voiding-avoid urinary catheter unless indicated Outcome: Progressing Goal: Hemodynamically stable Outcome: Progressing Goal: Other Phase I Outcomes/Goals Outcome: Progressing   Problem: Phase II Progression Outcomes Goal: Therapeutic drug levels for anticoagulation Outcome: Progressing Goal: 02 sats trending upward/stable (PE) Outcome: Progressing Goal: Discharge plan established Outcome: Progressing Goal: Tolerating diet Outcome: Progressing Goal: Other Phase II Outcomes/Goals Outcome: Progressing   Problem: Phase III Progression Outcomes Goal: 02 sats stabilized Outcome: Progressing Goal: Activity at appropriate level-compared to baseline Description: (UP IN CHAIR FOR HEMODIALYSIS) Outcome: Progressing Goal: Discharge plan remains appropriate-arrangements made Outcome: Progressing Goal: Other Phase III Outcomes/Goals Outcome: Progressing   Problem: Discharge Progression Outcomes Goal: Barriers To Progression Addressed/Resolved Outcome: Progressing Goal: Discharge plan in place and appropriate Outcome: Progressing Goal: Pain controlled with appropriate  interventions Outcome: Progressing Goal: Hemodynamically stable Outcome: Progressing Goal: Complications resolved/controlled Outcome: Progressing Goal: Tolerating diet Outcome: Progressing Goal: Activity appropriate for discharge plan Outcome: Progressing Goal: Other Discharge Outcomes/Goals Outcome: Progressing

## 2020-02-07 NOTE — Progress Notes (Addendum)
ANTICOAGULATION CONSULT NOTE - Follow Up Consult  Pharmacy Consult for Heparin Indication: pulmonary embolus  Allergies  Allergen Reactions   Lisinopril Cough    Patient Measurements: Height: 5\' 10"  (177.8 cm) Weight: 104.5 kg (230 lb 5 oz) IBW/kg (Calculated) : 73 HEPARIN DW (KG): 95.2   Vital Signs: Temp: 98.4 F (36.9 C) (10/29 0421) Temp Source: Oral (10/29 0421) BP: 171/88 (10/29 0641) Pulse Rate: 67 (10/29 0641)  Labs: Recent Labs    02/05/20 2035 02/05/20 2035 02/05/20 2232 02/06/20 0006 02/06/20 0006 02/06/20 0451 02/06/20 0657 02/06/20 1532 02/07/20 0412  HGB 9.5*   < >  --   --   --  9.3*  --   --  9.2*  HCT 30.6*  --   --   --   --  30.2*  --   --  29.0*  PLT 179  --   --   --   --  160  --   --  187  APTT  --   --   --  30  --   --   --   --   --   LABPROT  --   --   --  12.8  --   --   --   --   --   INR  --   --   --  1.0  --   --   --   --   --   HEPARINUNFRC  --   --   --  <0.10*   < >  --  0.46 0.46 0.44  CREATININE 1.97*  --   --   --   --  1.74*  --   --  1.73*  TROPONINIHS 65*  --  66*  --   --   --   --   --   --    < > = values in this interval not displayed.    Estimated Creatinine Clearance: 61.8 mL/min (A) (by C-G formula based on SCr of 1.73 mg/dL (H)).   Medications:  Eliquis 5mg  BID- last dose 01/29/20, Baseline heparin level confirms no eliquis on board.  Assessment: 49 yo M with hx VTE  and Afib who is chronically on Eliquis.  He was recently hospitalized 10/22-10/26 with GI Bleeding.  Eliquis was held due to bleeding with GI recommendations to resume on 10/26.  Patient has not yet resumed medication.  Baseline heparin level confirms no eliquis on board. He returned to hospital today feeling worse with LLE & abdominal pain.  Chest CT + for new PE.  Today, 02/07/2020   Heparin level, 0.44, remains therapeutic on 1500 units/hr  CBC: Hgb low but stable at 9.2; Plts WNL  SCr: improved  No bleeding or complications  reported  Goal of Therapy:  Heparin level 0.3-0.7 units/ml - **target lower end of goal range due to recent bleeding** Monitor platelets by anticoagulation protocol: Yes   Plan:  Continue heparin infusion at 1500 units/hr Daily heparin level & CBC while on heparin Follow up long-term anticoagulation plans.   Gretta Arab PharmD, BCPS Clinical Pharmacist [facility] main pharmacy [phone] 02/07/2020 7:31 AM    Addendum: MD requests transition from heparin to apixaban per pharmacy today.   Heparin is therapeutic, he is anemic but stable, no bleeding reported. Plan: Stop heparin Apixaban 10 mg PO BID x 7 days, then 5 mg PO BID Recommend close monitoring for recurrent GIB Pharmacy will follow up CBC peripherally.  Gretta Arab PharmD, BCPS Clinical  Pharmacist [facility] main pharmacy [phone] 02/07/2020 10:18 AM

## 2020-02-07 NOTE — Progress Notes (Signed)
PROGRESS NOTE  T[place] Rush GHW:[id number] DOB: [birth year] DOA: 02/05/2020 PCP: [name], FNP   LOS: 2 days   Brief Narrative / Interim history: 49 year old male with deafness, vitamin D deficiency, recurrent pancreatitis, nonischemic cardiomyopathy with chronic systolic CHF, hypertension, hyperlipidemia, history of DVT/PEs, DM 2, AICD who came to the hospital on 10/27 with abdominal pain and distention, along with shortness of breath.  He was recently hospitalized and discharged on 10/26, hospital course was pertinent for black stools, hematemesis and at that time anticoagulation has been on hold, he was supposed to resume today following discharge, A. fib with RVR now on amiodarone and converted back to sinus rhythm.  Patient also reports currently that has not had a bowel movement since being discharged.  In the ED underwent a CT scan of the chest, abdomen and pelvis which showed PE as well as increased stool burden on abdominal CT.  His lipase was also found to be elevated however the pancreas was unremarkable on imaging.  Subjective / 24h Interval events: Doing well this morning, less abdominal pain. Wants to eat more. Denies shortness of breath   Assessment & Plan: Principal Problem Acute bilateral DVTs, acute PE-CT angiogram showed nonocclusive thrombus within the right main pulmonary artery and partially occlusive thrombus in the right upper lobe and posterior lower lobe subsegmental arteries.  DVT ultrasound on 10/28 shows bilateral DVTs.  Patient was on chronic Eliquis but he was off of that due to GI bleed while hospitalized. He presented with recurrent thrombotic events, however I doubt this is Eliquis failure as he was off anticoagulation and hospitalized / immobile for a full week, therefore at high risk.  He was initially maintained on heparin and was positioned to Eliquis, seems to be tolerating well, hemoglobin stable.  If remains stable will go home tomorrow  Active  Problems Acute on chronic pancreatitis-lipase is elevated however he does not have any significant CT findings of inflammatory changes within the pancreas.  He is tolerating clear liquids, advance diet today and see if he tolerates  Hypertensive crisis-blood pressure in the 200s in the ED, gradually improving  Hyperglycemia in the setting of type 2 diabetes mellitus-continue Lantus, sliding scale, last A1c was 11 about during prior hospital stay.  Chronic systolic and diastolic CHF, nonischemic cardiomyopathy, AICD in place-he has slight fluid overload with upper extremity swelling, due to acute kidney injury during prior hospital stay he was taken off Entresto and Lasix, resume Lasix today and monitor renal function  History of A. fib with RVR, NSVT-seen by cardiology during prior hospital stay, currently on amiodarone and remains in sinus.  Continue anticoagulation.  Recent acute GI bleed with hematemesis, during prior hospital stay-GI consulted, EGD showed LA grade B esophagitis, gastritis, normal duodenal bulb, with bleeding thought to be due to esophagitis and gastritis without endoscopic finding for high risk of rebleeding.  Chronic kidney disease stage IIIb -Baseline creatinine ranging up to 1.7 as an outpatient, currently at baseline at 1.7.  Monitor.  Abdominal pain-possibly combination of mild pancreatitis versus increased important.  He had at least 4-5 bowel movement since yesterday with improvement in his abdominal pain  Scheduled Meds: . amiodarone  200 mg Oral BID  . apixaban  10 mg Oral BID   Followed by  . [START ON 02/14/2020] apixaban  5 mg Oral BID  . atorvastatin  80 mg Oral QHS  . carvedilol  25 mg Oral BID WC  . fenofibrate  54 mg Oral Daily  .  furosemide  40 mg Oral Daily  . hydrALAZINE  37.5 mg Oral Q8H  . icosapent Ethyl  2 g Oral BID  . insulin aspart  0-15 Units Subcutaneous TID WC  . insulin aspart  0-5 Units Subcutaneous QHS  . insulin detemir  20  Units Subcutaneous QHS  . pantoprazole  40 mg Oral BID  . polyethylene glycol  17 g Oral Daily  . senna-docusate  2 tablet Oral BID  . traZODone  100 mg Oral QHS   Continuous Infusions: PRN Meds:.acetaminophen **OR** acetaminophen, citalopram, Glycerin (Adult), hydrALAZINE, morphine injection, promethazine, topiramate  Diet Orders (From admission, onward)    Start     Ordered   02/07/20 0742  Diet regular Room service appropriate? Yes; Fluid consistency: Thin  Diet effective now       Question Answer Comment  Room service appropriate? Yes   Fluid consistency: Thin      02/07/20 0741          DVT prophylaxis:  apixaban (ELIQUIS) tablet 10 mg  apixaban (ELIQUIS) tablet 5 mg     Code Status: Full Code  Family Communication: no family at bedside   Status is: Inpatient  Remains inpatient appropriate because:Inpatient level of care appropriate due to severity of illness   Dispo: The patient is from: Home              Anticipated d/c is to: Home              Anticipated d/c date is: 1 day              Patient currently is not medically stable to d/c.  Consultants:  None   Procedures:  2D echo  Microbiology  None   Antimicrobials: None     Objective: Vitals:   02/06/20 2002 02/07/20 0421 02/07/20 0641 02/07/20 1352  BP: (!) 158/97 (!) 175/91 (!) 171/88 (!) 145/83  Pulse: 72 69 67 69  Resp: 18 20  17   Temp: 99 F (37.2 C) 98.4 F (36.9 C)  97.9 F (36.6 C)  TempSrc: Oral Oral  Oral  SpO2: 100% 98%  100%  Weight:      Height:        Intake/Output Summary (Last 24 hours) at 02/07/2020 1413 Last data filed at 02/07/2020 1013 Gross per 24 hour  Intake 1085.59 ml  Output --  Net 1085.59 ml   Filed Weights   02/05/20 2244  Weight: 104.5 kg    Examination:  Constitutional: NAD Eyes: no scleral icterus ENMT: Mucous membranes are moist.  Neck: normal, supple Respiratory: clear to auscultation bilaterally, no wheezing, no crackles. Normal  respiratory effort.  Cardiovascular: Regular rate and rhythm, no murmurs / rubs / gallops. LE and UE edema.  Abdomen: non distended, no tenderness. Bowel sounds positive.  Musculoskeletal: no clubbing / cyanosis.  Skin: no rashes Neurologic: CN 2-12 grossly intact. Strength 5/5 in all 4.  Psychiatric: Normal judgment and insight. Alert and oriented x 3. Normal mood.    Data Reviewed: I have independently reviewed following labs and imaging studies   CBC: Recent Labs  Lab 02/02/20 0239 02/02/20 0239 02/03/20 0305 02/04/20 0822 02/05/20 2035 02/06/20 0451 02/07/20 0412  WBC 8.0   < > 6.8 7.4 5.3 5.6 4.9  NEUTROABS 5.8  --  4.8 5.0 3.4 3.5  --   HGB 11.8*   < > 11.4* 10.6* 9.5* 9.3* 9.2*  HCT 37.7*   < > 37.2* 33.4* 30.6* 30.2* 29.0*  MCV 95.2   < >  97.6 94.1 95.3 95.9 94.5  PLT 190   < > 191 171 179 160 187   < > = values in this interval not displayed.   Basic Metabolic Panel: Recent Labs  Lab 01/31/20 2317 02/01/20 0520 02/02/20 0239 02/02/20 0239 02/03/20 0305 02/03/20 0305 02/03/20 1639 02/04/20 1610 02/05/20 2035 02/06/20 0451 02/07/20 0412  NA 138   < > 135   < > 140   < > 132* 136 142 137 133*  K 5.6*   < > 3.4*   < > 3.7   < > 4.2 3.9 4.7 4.1 3.8  CL 94*   < > 97*   < > 99   < > 96* 101 102 104 99  CO2 21*   < > 23   < > 28   < > 25 25 25 23 24   GLUCOSE 381*   < > 176*   < > 111*   < > 363* 150* 298* 203* 71  BUN 25*   < > 35*   < > 36*   < > 41* 32* 24* 19 13  CREATININE 1.86*   < > 2.83*   < > 2.85*   < > 2.65* 2.24* 1.97* 1.74* 1.73*  CALCIUM 9.0   < > 8.6*   < > 9.1   < > 8.5* 8.9 9.2 8.4* 8.6*  MG 1.8  --  1.9  --  1.8  --   --  2.0  --  2.1  --   PHOS  --   --  4.0  --  5.0*  --   --  3.9  --   --   --    < > = values in this interval not displayed.   Liver Function Tests: Recent Labs  Lab 02/03/20 0305 02/04/20 0822 02/05/20 2035 02/06/20 0451 02/07/20 0412  AST 27 25 24 18 16   ALT 17 16 16 15 12   ALKPHOS 47 48 50 45 38  BILITOT 1.2 1.3*  1.0 0.9 0.8  PROT 6.4* 6.8 7.5 6.4* 6.1*  ALBUMIN 3.2* 3.1* 3.5 3.1* 2.8*   Coagulation Profile: Recent Labs  Lab 02/06/20 0006  INR 1.0   HbA1C: No results for input(s): HGBA1C in the last 72 hours. CBG: Recent Labs  Lab 02/06/20 2241 02/07/20 0330 02/07/20 0548 02/07/20 0755 02/07/20 1349  GLUCAP 165* 76 102* 91 307*    Recent Results (from the past 240 hour(s))  Respiratory Panel by RT PCR (Flu A&B, Covid) - Nasopharyngeal Swab     Status: None   Collection Time: 02/01/20  1:38 AM   Specimen: Nasopharyngeal Swab  Result Value Ref Range Status   SARS Coronavirus 2 by RT PCR NEGATIVE NEGATIVE Final    Comment: (NOTE) SARS-CoV-2 target nucleic acids are NOT DETECTED.  The SARS-CoV-2 RNA is generally detectable in upper respiratoy specimens during the acute phase of infection. The lowest concentration of SARS-CoV-2 viral copies this assay can detect is 131 copies/mL. A negative result does not preclude SARS-Cov-2 infection and should not be used as the sole basis for treatment or other patient management decisions. A negative result may occur with  improper specimen collection/handling, submission of specimen other than nasopharyngeal swab, presence of viral mutation(s) within the areas targeted by this assay, and inadequate number of viral copies (<131 copies/mL). A negative result must be combined with clinical observations, patient history, and epidemiological information. The expected result is Negative.  Fact Sheet for Patients:  PinkCheek.be  Fact Sheet  for Healthcare Providers:  GravelBags.it  This test is no t yet approved or cleared by the [place] and  has been authorized for detection and/or diagnosis of SARS-CoV-2 by FDA under an Emergency Use Authorization (EUA). This EUA will remain  in effect (meaning this test can be used) for the duration of the COVID-19 declaration under Section  564(b)(1) of the Act, 21 U.S.C. section 360bbb-3(b)(1), unless the authorization is terminated or revoked sooner.     Influenza A by PCR NEGATIVE NEGATIVE Final   Influenza B by PCR NEGATIVE NEGATIVE Final    Comment: (NOTE) The Xpert Xpress SARS-CoV-2/FLU/RSV assay is intended as an aid in  the diagnosis of influenza from Nasopharyngeal swab specimens and  should not be used as a sole basis for treatment. Nasal washings and  aspirates are unacceptable for Xpert Xpress SARS-CoV-2/FLU/RSV  testing.  Fact Sheet for Patients: PinkCheek.be  Fact Sheet for Healthcare Providers: GravelBags.it  This test is not yet approved or cleared by the [place] FDA and  has been authorized for detection and/or diagnosis of SARS-CoV-2 by  FDA under an Emergency Use Authorization (EUA). This EUA will remain  in effect (meaning this test can be used) for the duration of the  Covid-19 declaration under Section 564(b)(1) of the Act, 21  U.S.C. section 360bbb-3(b)(1), unless the authorization is  terminated or revoked. Performed at [facility], [place] [address] [place], [place] [address]   MRSA PCR Screening     Status: None   Collection Time: 02/01/20  8:20 PM   Specimen: Nasopharyngeal  Result Value Ref Range Status   MRSA by PCR NEGATIVE NEGATIVE Final    Comment:        The GeneXpert MRSA Assay (FDA approved for NASAL specimens only), is one component of a comprehensive MRSA colonization surveillance program. It is not intended to diagnose MRSA infection nor to guide or monitor treatment for MRSA infections. Performed at [facility], [place] [address] [place], [place] [address]   Respiratory Panel by RT PCR (Flu A&B, Covid) - Nasopharyngeal Swab     Status: None   Collection Time: 02/05/20 10:32 PM   Specimen: Nasopharyngeal Swab  Result Value Ref Range Status   SARS Coronavirus 2 by RT  PCR NEGATIVE NEGATIVE Final    Comment: (NOTE) SARS-CoV-2 target nucleic acids are NOT DETECTED.  The SARS-CoV-2 RNA is generally detectable in upper respiratoy specimens during the acute phase of infection. The lowest concentration of SARS-CoV-2 viral copies this assay can detect is 131 copies/mL. A negative result does not preclude SARS-Cov-2 infection and should not be used as the sole basis for treatment or other patient management decisions. A negative result may occur with  improper specimen collection/handling, submission of specimen other than nasopharyngeal swab, presence of viral mutation(s) within the areas targeted by this assay, and inadequate number of viral copies (<131 copies/mL). A negative result must be combined with clinical observations, patient history, and epidemiological information. The expected result is Negative.  Fact Sheet for Patients:  PinkCheek.be  Fact Sheet for Healthcare Providers:  GravelBags.it  This test is no t yet approved or cleared by the [place] FDA and  has been authorized for detection and/or diagnosis of SARS-CoV-2 by FDA under an Emergency Use Authorization (EUA). This EUA will remain  in effect (meaning this test can be used) for the duration of the COVID-19 declaration under Section 564(b)(1) of the Act, 21 U.S.C. section 360bbb-3(b)(1), unless the authorization is  terminated or revoked sooner.     Influenza A by PCR NEGATIVE NEGATIVE Final   Influenza B by PCR NEGATIVE NEGATIVE Final    Comment: (NOTE) The Xpert Xpress SARS-CoV-2/FLU/RSV assay is intended as an aid in  the diagnosis of influenza from Nasopharyngeal swab specimens and  should not be used as a sole basis for treatment. Nasal washings and  aspirates are unacceptable for Xpert Xpress SARS-CoV-2/FLU/RSV  testing.  Fact Sheet for Patients: PinkCheek.be  Fact Sheet for  Healthcare Providers: GravelBags.it  This test is not yet approved or cleared by the [place] FDA and  has been authorized for detection and/or diagnosis of SARS-CoV-2 by  FDA under an Emergency Use Authorization (EUA). This EUA will remain  in effect (meaning this test can be used) for the duration of the  Covid-19 declaration under Section 564(b)(1) of the Act, 21  U.S.C. section 360bbb-3(b)(1), unless the authorization is  terminated or revoked. Performed at [facility], [place] [address], [place], [place] [address]      Radiology Studies: ECHOCARDIOGRAM COMPLETE  Result Date: 02/06/2020    ECHOCARDIOGRAM REPORT   Patient Name:   T[place] Rush Date of Exam: 02/06/2020 Medical Rec #:  [id number]    Height:       70.0 in Accession #:    [id number]   Weight:       230.3 lb Date of Birth:  19-May-1970   BSA:          2.216 m Patient Age:    43 years     BP:           179/91 mmHg Patient Gender: M            HR:           73 bpm. Exam Location:  Inpatient Procedure: 2D Echo Indications:    pulmonary embolus  History:        Patient has prior history of Echocardiogram examinations, most                 recent 01/04/2019. CHF; Risk Factors:Dyslipidemia and                 Hypertension. AICD. NICM.  Sonographer:    Jannett Celestine RDCS (AE) Referring Phys: [id number] ASIA B [place]  Sonographer Comments: see comments IMPRESSIONS  1. Left ventricular ejection fraction, by estimation, is 45%. The left ventricle has mildly decreased function. The left ventricle demonstrates global hypokinesis. There is moderate left ventricular hypertrophy. Left ventricular diastolic parameters are  consistent with Grade I diastolic dysfunction (impaired relaxation).  2. Right ventricular systolic function is mildly reduced. The right ventricular size is normal. Tricuspid regurgitation signal is inadequate for assessing PA pressure.  3. The mitral valve is normal in  structure. Trivial mitral valve regurgitation. No evidence of mitral stenosis.  4. The aortic valve is tricuspid. Aortic valve regurgitation is not visualized. No aortic stenosis is present. FINDINGS  Left Ventricle: Left ventricular ejection fraction, by estimation, is 45%. The left ventricle has mildly decreased function. The left ventricle demonstrates global hypokinesis. The left ventricular internal cavity size was normal in size. There is moderate left ventricular hypertrophy. Left ventricular diastolic parameters are consistent with Grade I diastolic dysfunction (impaired relaxation). Right Ventricle: The right ventricular size is normal. No increase in right ventricular wall thickness. Right ventricular systolic function is mildly reduced. Tricuspid regurgitation signal is inadequate for assessing PA pressure. Left Atrium: Left atrial size was normal in size.  Right Atrium: Right atrial size was normal in size. Pericardium: There is no evidence of pericardial effusion. Mitral Valve: The mitral valve is normal in structure. Trivial mitral valve regurgitation. No evidence of mitral valve stenosis. Tricuspid Valve: The tricuspid valve is normal in structure. Tricuspid valve regurgitation is not demonstrated. Aortic Valve: The aortic valve is tricuspid. Aortic valve regurgitation is not visualized. No aortic stenosis is present. Pulmonic Valve: The pulmonic valve was normal in structure. Pulmonic valve regurgitation is not visualized. Aorta: The aortic root is normal in size and structure. Venous: The inferior vena cava was not well visualized. IAS/Shunts: No atrial level shunt detected by color flow Doppler. Additional Comments: A pacer wire is visualized in the right ventricle.  LEFT VENTRICLE PLAX 2D LVIDd:         5.40 cm  Diastology LVIDs:         4.10 cm  LV e' medial:    7.07 cm/s LV PW:         1.20 cm  LV E/e' medial:  12.5 LV IVS:        1.10 cm  LV e' lateral:   6.53 cm/s LVOT diam:     2.40 cm  LV  E/e' lateral: 13.6 LV SV:         71 LV SV Index:   32 LVOT Area:     4.52 cm  LEFT ATRIUM         Index LA diam:    4.00 cm 1.80 cm/m  AORTIC VALVE LVOT Vmax:   79.60 cm/s LVOT Vmean:  56.500 cm/s LVOT VTI:    0.156 m  AORTA Ao Root diam: 3.40 cm MITRAL VALVE MV Area (PHT): 2.91 cm    SHUNTS MV Decel Time: 261 msec    Systemic VTI:  0.16 m MV E velocity: 88.70 cm/s  Systemic Diam: 2.40 cm MV A velocity: 93.40 cm/s MV E/A ratio:  0.95 Loralie Champagne MD Electronically signed by Loralie Champagne MD Signature Date/Time: 02/06/2020/5:53:08 PM    Final     Marzetta Board, MD, PhD [facility]  Between 7 am - 7 pm I am available, please contact me via Amion or Securechat  Between 7 pm - 7 am I am not available, please contact night coverage MD/APP via Amion

## 2020-02-07 NOTE — Progress Notes (Signed)
Patient asked for bed alarm to be turned off. Patient is a moderate fall risk and has been steady on when mobilizing around the room. Patient was getting frustrated with the bed alarm going off, so we turned he bed alarm off and made sure his call light is within reach.

## 2020-02-07 NOTE — Progress Notes (Signed)
RN used the interpreter (ipad, under the heading of 'CDI') to communicate with the patient. The patient seemed to enjoy communicating with the on-line interpreter.

## 2020-02-07 NOTE — Care Management Important Message (Signed)
Important Message  Patient Details IM Letter given to the Patient Name: [place] Rush MRN: [id number] Date of Birth: 03/23/1971   Medicare Important Message Given:  Yes     Kerin Salen 02/07/2020, 12:22 PM

## 2020-02-07 NOTE — Discharge Instructions (Signed)
Information on my medicine - ELIQUIS (apixaban) Why was Eliquis prescribed for you? Eliquis was prescribed to treat blood clots that may have been found in the veins of your legs (deep vein thrombosis) or in your lungs (pulmonary embolism) and to reduce the risk of them occurring again.  What do You need to know about Eliquis ? The starting dose is 10 mg (two 5 mg tablets) taken TWICE daily for the FIRST SEVEN (7) DAYS, then on  11/08/14/19  the dose is reduced to ONE 5 mg tablet taken TWICE daily.  Eliquis may be taken with or without food.   Try to take the dose about the same time in the morning and in the evening. If you have difficulty swallowing the tablet whole please discuss with your pharmacist how to take the medication safely.  Take Eliquis exactly as prescribed and DO NOT stop taking Eliquis without talking to the doctor who prescribed the medication.  Stopping may increase your risk of developing a new blood clot.  Refill your prescription before you run out.  After discharge, you should have regular check-up appointments with your healthcare provider that is prescribing your Eliquis.    What do you do if you miss a dose? If a dose of ELIQUIS is not taken at the scheduled time, take it as soon as possible on the same day and twice-daily administration should be resumed. The dose should not be doubled to make up for a missed dose.  Important Safety Information A possible side effect of Eliquis is bleeding. You should call your healthcare provider right away if you experience any of the following: ? Bleeding from an injury or your nose that does not stop. ? Unusual colored urine (red or dark brown) or unusual colored stools (red or black). ? Unusual bruising for unknown reasons. ? A serious fall or if you hit your head (even if there is no bleeding).  Some medicines may interact with Eliquis and might increase your risk of bleeding or clotting while on Eliquis. To help  avoid this, consult your healthcare provider or pharmacist prior to using any new prescription or non-prescription medications, including herbals, vitamins, non-steroidal anti-inflammatory drugs (NSAIDs) and supplements.  This website has more information on Eliquis (apixaban): http://www.eliquis.com/eliquis/home

## 2020-02-07 NOTE — Progress Notes (Signed)
Pt received an extra 2 units of Novolog insulin based on the result of CBG 242 on 10/28 at 1644 instead of 0 unit,  MD notified, Pt was given juice and cracker and rechecked BS 76.

## 2020-02-08 LAB — CBC
HCT: 27.1 % — ABNORMAL LOW (ref 39.0–52.0)
Hemoglobin: 8.5 g/dL — ABNORMAL LOW (ref 13.0–17.0)
MCH: 29.2 pg (ref 26.0–34.0)
MCHC: 31.4 g/dL (ref 30.0–36.0)
MCV: 93.1 fL (ref 80.0–100.0)
Platelets: 216 10*3/uL (ref 150–400)
RBC: 2.91 MIL/uL — ABNORMAL LOW (ref 4.22–5.81)
RDW: 12.2 % (ref 11.5–15.5)
WBC: 3.8 10*3/uL — ABNORMAL LOW (ref 4.0–10.5)
nRBC: 0 % (ref 0.0–0.2)

## 2020-02-08 LAB — BASIC METABOLIC PANEL
Anion gap: 10 (ref 5–15)
BUN: 17 mg/dL (ref 6–20)
CO2: 25 mmol/L (ref 22–32)
Calcium: 8.9 mg/dL (ref 8.9–10.3)
Chloride: 101 mmol/L (ref 98–111)
Creatinine, Ser: 1.77 mg/dL — ABNORMAL HIGH (ref 0.61–1.24)
GFR, Estimated: 46 mL/min — ABNORMAL LOW (ref >60)
Glucose, Bld: 204 mg/dL — ABNORMAL HIGH (ref 70–99)
Potassium: 4.1 mmol/L (ref 3.5–5.1)
Sodium: 136 mmol/L (ref 135–145)

## 2020-02-08 LAB — GLUCOSE, CAPILLARY: Glucose-Capillary: 156 mg/dL — ABNORMAL HIGH (ref 70–99)

## 2020-02-08 MED ORDER — FUROSEMIDE 40 MG PO TABS
40.0000 mg | ORAL_TABLET | Freq: Every day | ORAL | 1 refills | Status: DC
Start: 2020-02-08 — End: 2020-02-13

## 2020-02-08 MED ORDER — APIXABAN (ELIQUIS) VTE STARTER PACK (10MG AND 5MG): ORAL_TABLET | ORAL | 0 refills | Status: DC

## 2020-02-08 NOTE — Discharge Summary (Signed)
Physician Discharge Summary  Troy Rush EHM:[id number] DOB: 05/11/1970 DOA: 02/05/2020  PCP: [name], FNP  Admit date: 02/05/2020 Discharge date: 02/08/2020  Admitted From: home Disposition:  home  Recommendations for Outpatient Follow-up:  1. Follow up with Dr Aundra Dubin with cardiology as scheduled in 5 days 2. Please obtain BMP/CBC  Home Health: none Equipment/Devices: none  Discharge Condition: stable CODE STATUS: Full code Diet recommendation: low sodium  HPI: Per admitting MD, Troy Rush  is a 49 y.o. male, with history of deafness, vitamin D deficiency, recurrent pancreatitis, nonischemic cardiomyopathy, CHF last echo showing ejection fraction 30 to 35%, hypertension, hyperlipidemia, DVT and PE, diabetes mellitus type 2, AICD placement, who presents to the emergency room with a chief complaint of abdominal pain and distention.  History was obtained through the use of a ACL interpreter on iPad.  The pain is in his lower abdomen and it is severe.  He describes it as a crampy pain that waxes and wanes.  He has not identified any provocative or relieving factors.  Reports the pain is most intense in his lower abdomen.  He has not had any nausea or vomiting, or diarrhea or constipation.  Patient reports that he has not had a bowel movement since he left the hospital on 02/04/2020.  Patient reports that this pain feels like pancreatitis that he has had in the past.  Patient drinks alcohol occasionally socially.  He does not know why he continues to have pancreatitis. Patient also reports he has had shortness of breath.  Both the abdominal pain and the shortness of breath started this afternoon on 02/05/2020.  He reports that the shortness of breath is worse with exertion and better with rest.  He had some chest.  Associated with it earlier today.  The chest pain was squeezing, in the center of his chest.  There was no radiation.  The chest pain resolved spontaneously per patient  but he thinks it may be laying in the bed helped.  Patient reports that he has not been coughing any more than normal.  When he does cough is productive of yellow sputum. Of note patient recently was discharged from the hospital.  He was in the ICU after presenting for hematemesis and epigastric discomfort.  He had also noted some black stools at that time.  (Patient has not had any black stools since he left the hospital.)  Patient had been on Eliquis for history of PE and paroxysmal atrial fibrillation.  Patient was started on amiodarone drip for A. fib on his last admission and converted to sinus rhythm.   Patient was discharged on amiodarone. He was also in DKA that admission.  Cardiology saw the patient during his last admission and they recommended holding Entresto and Lasix at discharge.  Hospital Course / Discharge diagnoses: Principal Problem Acute bilateral DVTs, acute PE-CT angiogram showed nonocclusive thrombus within the right main pulmonary artery and partially occlusive thrombus in the right upper lobe and posterior lower lobe subsegmental arteries. DVT ultrasound on 10/28 shows bilateral DVTs. Patient was on chronic Eliquis but he was off of that due to GI bleed while hospitalized. He presented with recurrent thrombotic events, however I doubt this is Eliquis failure as he was off anticoagulation and hospitalized / immobile for a full week, therefore at high risk.  He was initially maintained on heparin and was transitioned to Eliquis, seems to be tolerating well.  No evidence of bleeding, no hematemesis, no blood in the stool.  Active  Problems Acute on chronic pancreatitis-lipase is elevated however he does not have any significant CT findings of inflammatory changes within the pancreas.   Tolerating a regular diet HTN-resume home medications DM2 with hyperglycemia-resume home medications Chronic systolic and diastolic CHF, nonischemic cardiomyopathy, AICD in place-he has slight fluid  overload with upper extremity swelling, due to acute kidney injury during prior hospital stay he was taken off Entresto and Lasix, Lasix resumed, fluid status improved, renal function is stable. Resume Lasix on discharge, he will see cardiology in 5 days, potentially can resume Entresto if renal function is stable next week and blood pressure allows. History of A. fib with RVR,NSVT-seen by cardiology during prior hospital stay, currently on amiodarone and remains in sinus. Continue anticoagulation. Recent acute GI bleed with hematemesis, during prior hospital stay-GI consulted, EGD showed LA grade B esophagitis, gastritis, normal duodenal bulb, with bleeding thought to be due to esophagitis and gastritis without endoscopic finding for high risk of rebleeding. Continue PPI. No bleeding currently Chronic kidney disease stage IIIb-Baseline creatinine ranging up to 1.7 as an outpatient, currently at baseline at 1.7. Monitor as an outpatient Abdominal pain-possibly combination of mild pancreatitis versus constipation. He had several bowel movements with aggressive bowel regimen, pain resolved, he is tolerating a regular diet.   Discharge Instructions   Allergies as of 02/08/2020      Reactions   Lisinopril Cough      Medication List    STOP taking these medications   apixaban 5 MG Tabs tablet Commonly known as: Eliquis Replaced by: Apixaban Starter Pack (10mg  and 5mg )     TAKE these medications   amiodarone 200 MG tablet Commonly known as: PACERONE Take 1 tablet (200 mg total) by mouth 2 (two) times daily for 7 days.   Apixaban Starter Pack (10mg  and 5mg ) Commonly known as: ELIQUIS STARTER PACK Take as directed on package: start with two-5mg  tablets twice daily for 7 days. On day 8, switch to one-5mg  tablet twice daily. Replaces: apixaban 5 MG Tabs tablet   atorvastatin 80 MG tablet Commonly known as: LIPITOR TAKE 1 TABLET(80 MG) BY MOUTH DAILY What changed: See the new  instructions.   carvedilol 25 MG tablet Commonly known as: COREG Take 1 tablet (25 mg total) by mouth 2 (two) times daily with a meal.   citalopram 40 MG tablet Commonly known as: CELEXA Take 40 mg by mouth daily as needed (depression).   fenofibrate 54 MG tablet Take 1 tablet (54 mg total) by mouth daily.   furosemide 40 MG tablet Commonly known as: LASIX Take 1 tablet (40 mg total) by mouth daily.   hydrALAZINE 25 MG tablet Commonly known as: APRESOLINE Take 1.5 tablets (37.5 mg total) by mouth every 8 (eight) hours.   icosapent Ethyl 1 g capsule Commonly known as: VASCEPA Take 2 capsules (2 g total) by mouth 2 (two) times daily.   insulin glargine 100 UNIT/ML injection Commonly known as: LANTUS Inject 0.24 mLs (24 Units total) into the skin daily.   isosorbide mononitrate 30 MG 24 hr tablet Commonly known as: IMDUR Take 1 tablet (30 mg total) by mouth daily.   latanoprost 0.005 % ophthalmic solution Commonly known as: XALATAN Place 1 drop into both eyes at bedtime.   pantoprazole 40 MG tablet Commonly known as: PROTONIX Take 1 tablet (40 mg total) by mouth 2 (two) times daily.   topiramate 50 MG tablet Commonly known as: TOPAMAX TAKE 1 TABLET BY MOUTH TWICE DAILY AS NEEDED FOR HEADACHE What changed:  how much to take  how to take this  when to take this  reasons to take this  additional instructions   traZODone 100 MG tablet Commonly known as: DESYREL Take 1 tablet (100 mg total) by mouth at bedtime.   Vitamin D (Ergocalciferol) 1.25 MG (50000 UNIT) Caps capsule Commonly known as: DRISDOL Take 1 capsule (50,000 Units total) by mouth every 7 (seven) days. What changed: when to take this       Follow-up Information    Primary care Physician. Schedule an appointment as soon as possible for a visit.        Larey Dresser, MD Follow up.   Specialty: Cardiology Why: as scheduled Contact information: [address]  [place] [address] [phone]               Consultations:  None   Procedures/Studies:  CT Abdomen Pelvis Wo Contrast  Result Date: 02/01/2020 CLINICAL DATA:  Abdominal pain and vomiting EXAM: CT ABDOMEN AND PELVIS WITHOUT CONTRAST TECHNIQUE: Multidetector CT imaging of the abdomen and pelvis was performed following the standard protocol without IV contrast. COMPARISON:  Aug 19, 2019 FINDINGS: Lower chest: There is mild cardiomegaly present. No hiatal hernia. The visualized portions of the lungs are clear. Hepatobiliary: Although limited due to the lack of intravenous contrast, normal in appearance without gross focal abnormality. No evidence of calcified gallstones or biliary ductal dilatation. Pancreas:  Unremarkable.  No surrounding inflammatory changes. Spleen: Normal in size. Although limited due to the lack of intravenous contrast, normal in appearance. Adrenals/Urinary Tract: Both adrenal glands appear normal. The kidneys and collecting system appear normal without evidence of urinary tract calculus or hydronephrosis. Bladder is unremarkable. Stomach/Bowel: The stomach, small bowel, and colon are normal in appearance. No inflammatory changes or obstructive findings. A moderate amount of colonic stool is present. Appendix is normal. Vascular/Lymphatic: There are no enlarged abdominal or pelvic lymph nodes. Scattered mild aortic atherosclerotic calcifications are seen without aneurysmal dilatation. Reproductive: The prostate is unremarkable. Other: No evidence of abdominal wall mass or hernia. Musculoskeletal: No acute or significant osseous findings. IMPRESSION: No acute intra-abdominal or pelvic pathology to explain the patient's symptoms. Moderate amount of colonic stool Aortic Atherosclerosis (ICD10-I70.0). Electronically Signed   By: Prudencio Pair M.D.   On: 02/01/2020 02:37   CT Angio Chest PE W and/or Wo Contrast  Result Date: 02/05/2020 CLINICAL DATA:  Left leg pain and  abdominal distension EXAM: CT ANGIOGRAPHY CHEST WITH CONTRAST TECHNIQUE: Multidetector CT imaging of the chest was performed using the standard protocol during bolus administration of intravenous contrast. Multiplanar CT image reconstructions and MIPs were obtained to evaluate the vascular anatomy. CONTRAST:  22mL OMNIPAQUE IOHEXOL 350 MG/ML SOLN COMPARISON:  None. FINDINGS: Cardiovascular: There is suboptimal opacification of the main pulmonary artery, however there is a small nonocclusive thrombus seen within the right main pulmonary artery with partially occlusive thrombus extending into the posterior right lower and anterior right upper lobe lobe subsegmental arterial branches. There is mild cardiomegaly. The no pericardial effusion or thickening. No evidence right heart strain. There is normal three-vessel brachiocephalic anatomy without proximal stenosis. The thoracic aorta is normal in appearance. Mediastinum/Nodes: No hilar, mediastinal, or axillary adenopathy. Thyroid gland, trachea, and esophagus demonstrate no significant findings. Lungs/Pleura: The lungs are clear. No pleural effusion or pneumothorax. No airspace consolidation. Upper Abdomen: No acute abnormalities present in the visualized portions of the upper abdomen. Musculoskeletal: No chest wall abnormality. No acute or significant osseous findings. Review of  the MIP images confirms the above findings. Abdomen/pelvis: Lower chest: The visualized heart size within normal limits. No pericardial fluid/thickening. No hiatal hernia. The visualized portions of the lungs are clear. Hepatobiliary: The liver is normal in density without focal abnormality.The main portal vein is patent. No evidence of calcified gallstones, gallbladder wall thickening or biliary dilatation. Pancreas: Unremarkable. No pancreatic ductal dilatation or surrounding inflammatory changes. Spleen: Normal in size without focal abnormality. Adrenals/Urinary Tract: Both adrenal glands  appear normal. The kidneys and collecting system appear normal without evidence of urinary tract calculus or hydronephrosis. Bladder is unremarkable. Stomach/Bowel: The stomach, small bowel, and colon are normal in appearance. No inflammatory changes, wall thickening, or obstructive findings. There is a moderate to large amount of colonic stool present.The appendix is normal. Vascular/Lymphatic: There are no enlarged mesenteric, retroperitoneal, or pelvic lymph nodes. No significant vascular findings are present. Reproductive: The prostate is unremarkable. Other: No evidence of abdominal wall mass or hernia. Musculoskeletal: No acute or significant osseous findings. IMPRESSION: 1. Suboptimal opacification of the main pulmonary artery. However there is nonocclusive thrombus seen within the right main pulmonary artery and partially occlusive thrombus in the right upper lobe and posterior lower lobe subsegmental arteries. 2. Minimal amount of ground-glass atelectasis at both lung bases 3. Moderate amount of colonic stool without evidence of obstruction. Electronically Signed   By: Prudencio Pair M.D.   On: 02/05/2020 22:10   CT ABDOMEN PELVIS W CONTRAST  Result Date: 02/05/2020 CLINICAL DATA:  Left leg pain and abdominal distension EXAM: CT ANGIOGRAPHY CHEST WITH CONTRAST TECHNIQUE: Multidetector CT imaging of the chest was performed using the standard protocol during bolus administration of intravenous contrast. Multiplanar CT image reconstructions and MIPs were obtained to evaluate the vascular anatomy. CONTRAST:  89mL OMNIPAQUE IOHEXOL 350 MG/ML SOLN COMPARISON:  None. FINDINGS: Cardiovascular: There is suboptimal opacification of the main pulmonary artery, however there is a small nonocclusive thrombus seen within the right main pulmonary artery with partially occlusive thrombus extending into the posterior right lower and anterior right upper lobe lobe subsegmental arterial branches. There is mild cardiomegaly.  The no pericardial effusion or thickening. No evidence right heart strain. There is normal three-vessel brachiocephalic anatomy without proximal stenosis. The thoracic aorta is normal in appearance. Mediastinum/Nodes: No hilar, mediastinal, or axillary adenopathy. Thyroid gland, trachea, and esophagus demonstrate no significant findings. Lungs/Pleura: The lungs are clear. No pleural effusion or pneumothorax. No airspace consolidation. Upper Abdomen: No acute abnormalities present in the visualized portions of the upper abdomen. Musculoskeletal: No chest wall abnormality. No acute or significant osseous findings. Review of the MIP images confirms the above findings. Abdomen/pelvis: Lower chest: The visualized heart size within normal limits. No pericardial fluid/thickening. No hiatal hernia. The visualized portions of the lungs are clear. Hepatobiliary: The liver is normal in density without focal abnormality.The main portal vein is patent. No evidence of calcified gallstones, gallbladder wall thickening or biliary dilatation. Pancreas: Unremarkable. No pancreatic ductal dilatation or surrounding inflammatory changes. Spleen: Normal in size without focal abnormality. Adrenals/Urinary Tract: Both adrenal glands appear normal. The kidneys and collecting system appear normal without evidence of urinary tract calculus or hydronephrosis. Bladder is unremarkable. Stomach/Bowel: The stomach, small bowel, and colon are normal in appearance. No inflammatory changes, wall thickening, or obstructive findings. There is a moderate to large amount of colonic stool present.The appendix is normal. Vascular/Lymphatic: There are no enlarged mesenteric, retroperitoneal, or pelvic lymph nodes. No significant vascular findings are present. Reproductive: The prostate is unremarkable. Other: No evidence of  abdominal wall mass or hernia. Musculoskeletal: No acute or significant osseous findings. IMPRESSION: 1. Suboptimal opacification of  the main pulmonary artery. However there is nonocclusive thrombus seen within the right main pulmonary artery and partially occlusive thrombus in the right upper lobe and posterior lower lobe subsegmental arteries. 2. Minimal amount of ground-glass atelectasis at both lung bases 3. Moderate amount of colonic stool without evidence of obstruction. Electronically Signed   By: Prudencio Pair M.D.   On: 02/05/2020 22:10   US RENAL  Result Date: 02/01/2020 CLINICAL DATA:  49 year old male with acute on chronic kidney injury. EXAM: RENAL / URINARY TRACT ULTRASOUND COMPLETE COMPARISON:  Prior CTs FINDINGS: Right Kidney: Renal measurements: 11.6 x 5.3 x 6.0 cm = volume: 491 mL. Echogenicity within normal limits. No mass or hydronephrosis visualized. Left Kidney: Renal measurements: 12.4 x 8.5 x 8.8 cm = volume: 791 mL. Echogenicity within normal limits. No mass or hydronephrosis visualized. Bladder: Appears normal for degree of bladder distention. Other: None. IMPRESSION: Unremarkable renal ultrasound. Electronically Signed   By: Margarette Canada M.D.   On: 02/01/2020 16:30   DG Abd Acute W/Chest  Result Date: 02/05/2020 CLINICAL DATA:  Chest pain, abdominal pain EXAM: DG ABDOMEN ACUTE WITH 1 VIEW CHEST COMPARISON:  01/31/2020 FINDINGS: No new consolidation or edema. No pleural effusion or pneumothorax. Stable cardiomediastinal contours with cardiomegaly. Left chest wall ICD is present. Bowel gas pattern is unremarkable. There is no free air. No radiopaque calculi identified. No acute osseous abnormality. IMPRESSION: No significant change since 01/31/2020.  No acute abnormality. Electronically Signed   By: Macy Mis M.D.   On: 02/05/2020 18:57   DG ABD ACUTE 2+V W 1V CHEST  Result Date: 01/31/2020 CLINICAL DATA:  Abdominal pain and vomiting starting today. EXAM: DG ABDOMEN ACUTE WITH 1 VIEW CHEST COMPARISON:  Chest 08/18/2019.  CT abdomen and pelvis 08/19/2019 FINDINGS: Cardiac pacemaker. Shallow inspiration.  Heart size and pulmonary vascularity are normal for technique. Lungs are clear. No pleural effusions. No pneumothorax. Scattered gas and stool throughout the colon. No small or large bowel distention. No free intra-abdominal air. No abnormal air-fluid levels. No radiopaque stones. Calcified phleboliths in the pelvis. Visualized bones and soft tissue contours are normal. IMPRESSION: No evidence of active pulmonary disease. Nonobstructive bowel gas pattern. Electronically Signed   By: Lucienne Capers M.D.   On: 01/31/2020 23:52   ECHOCARDIOGRAM COMPLETE  Result Date: 02/06/2020    ECHOCARDIOGRAM REPORT   Patient Name:   Troy Rush Date of Exam: 02/06/2020 Medical Rec #:  [id number]    Height:       70.0 in Accession #:    [id number]   Weight:       230.3 lb Date of Birth:  10-24-1970   BSA:          2.216 m Patient Age:    76 years     BP:           179/91 mmHg Patient Gender: M            HR:           73 bpm. Exam Location:  Inpatient Procedure: 2D Echo Indications:    pulmonary embolus  History:        Patient has prior history of Echocardiogram examinations, most                 recent 01/04/2019. CHF; Risk Factors:Dyslipidemia and  Hypertension. AICD. NICM.  Sonographer:    Jannett Celestine RDCS (AE) Referring Phys: [id number] [facility]  Sonographer Comments: see comments IMPRESSIONS  1. Left ventricular ejection fraction, by estimation, is 45%. The left ventricle has mildly decreased function. The left ventricle demonstrates global hypokinesis. There is moderate left ventricular hypertrophy. Left ventricular diastolic parameters are  consistent with Grade I diastolic dysfunction (impaired relaxation).  2. Right ventricular systolic function is mildly reduced. The right ventricular size is normal. Tricuspid regurgitation signal is inadequate for assessing PA pressure.  3. The mitral valve is normal in structure. Trivial mitral valve regurgitation. No evidence of mitral stenosis.  4. The  aortic valve is tricuspid. Aortic valve regurgitation is not visualized. No aortic stenosis is present. FINDINGS  Left Ventricle: Left ventricular ejection fraction, by estimation, is 45%. The left ventricle has mildly decreased function. The left ventricle demonstrates global hypokinesis. The left ventricular internal cavity size was normal in size. There is moderate left ventricular hypertrophy. Left ventricular diastolic parameters are consistent with Grade I diastolic dysfunction (impaired relaxation). Right Ventricle: The right ventricular size is normal. No increase in right ventricular wall thickness. Right ventricular systolic function is mildly reduced. Tricuspid regurgitation signal is inadequate for assessing PA pressure. Left Atrium: Left atrial size was normal in size. Right Atrium: Right atrial size was normal in size. Pericardium: There is no evidence of pericardial effusion. Mitral Valve: The mitral valve is normal in structure. Trivial mitral valve regurgitation. No evidence of mitral valve stenosis. Tricuspid Valve: The tricuspid valve is normal in structure. Tricuspid valve regurgitation is not demonstrated. Aortic Valve: The aortic valve is tricuspid. Aortic valve regurgitation is not visualized. No aortic stenosis is present. Pulmonic Valve: The pulmonic valve was normal in structure. Pulmonic valve regurgitation is not visualized. Aorta: The aortic root is normal in size and structure. Venous: The inferior vena cava was not well visualized. IAS/Shunts: No atrial level shunt detected by color flow Doppler. Additional Comments: A pacer wire is visualized in the right ventricle.  LEFT VENTRICLE PLAX 2D LVIDd:         5.40 cm  Diastology LVIDs:         4.10 cm  LV e' medial:    7.07 cm/s LV PW:         1.20 cm  LV E/e' medial:  12.5 LV IVS:        1.10 cm  LV e' lateral:   6.53 cm/s LVOT diam:     2.40 cm  LV E/e' lateral: 13.6 LV SV:         71 LV SV Index:   32 LVOT Area:     4.52 cm  LEFT  ATRIUM         Index LA diam:    4.00 cm 1.80 cm/m  AORTIC VALVE LVOT Vmax:   79.60 cm/s LVOT Vmean:  56.500 cm/s LVOT VTI:    0.156 m  AORTA Ao Root diam: 3.40 cm MITRAL VALVE MV Area (PHT): 2.91 cm    SHUNTS MV Decel Time: 261 msec    Systemic VTI:  0.16 m MV E velocity: 88.70 cm/s  Systemic Diam: 2.40 cm MV A velocity: 93.40 cm/s MV E/A ratio:  0.95 Loralie Champagne MD Electronically signed by Loralie Champagne MD Signature Date/Time: 02/06/2020/5:53:08 PM    Final    VAS [place] LOWER EXTREMITY VENOUS (DVT)  Result Date: 02/06/2020  Lower Venous DVT Study Indications: Pulmonary embolism.  Risk Factors: Confirmed PE. Anticoagulation: Heparin. Limitations: Poor  ultrasound/tissue interface. Comparison Study: No prior studies. Performing Technologist: Oliver Hum RVT  Examination Guidelines: A complete evaluation includes B-mode imaging, spectral Doppler, color Doppler, and power Doppler as needed of all accessible portions of each vessel. Bilateral testing is considered an integral part of a complete examination. Limited examinations for reoccurring indications may be performed as noted. The reflux portion of the exam is performed with the patient in reverse Trendelenburg.  +---------+---------------+---------+-----------+----------+--------------+ RIGHT    CompressibilityPhasicitySpontaneityPropertiesThrombus Aging +---------+---------------+---------+-----------+----------+--------------+ CFV      Full           Yes      Yes                                 +---------+---------------+---------+-----------+----------+--------------+ SFJ      Full                                                        +---------+---------------+---------+-----------+----------+--------------+ FV Prox  Full                                                        +---------+---------------+---------+-----------+----------+--------------+ FV Mid   Full                                                         +---------+---------------+---------+-----------+----------+--------------+ FV DistalFull                                                        +---------+---------------+---------+-----------+----------+--------------+ PFV      Full                                                        +---------+---------------+---------+-----------+----------+--------------+ POP      Full           Yes      Yes                                 +---------+---------------+---------+-----------+----------+--------------+ PTV      Partial                                      Acute          +---------+---------------+---------+-----------+----------+--------------+ PERO     None  Acute          +---------+---------------+---------+-----------+----------+--------------+   +---------+---------------+---------+-----------+----------+--------------+ LEFT     CompressibilityPhasicitySpontaneityPropertiesThrombus Aging +---------+---------------+---------+-----------+----------+--------------+ CFV      Full           Yes      Yes                                 +---------+---------------+---------+-----------+----------+--------------+ SFJ      Full                                                        +---------+---------------+---------+-----------+----------+--------------+ FV Prox  Full                                                        +---------+---------------+---------+-----------+----------+--------------+ FV Mid   Full                                                        +---------+---------------+---------+-----------+----------+--------------+ FV DistalFull                                                        +---------+---------------+---------+-----------+----------+--------------+ PFV      Full                                                         +---------+---------------+---------+-----------+----------+--------------+ POP      Full           Yes      Yes                                 +---------+---------------+---------+-----------+----------+--------------+ PTV      Partial                                      Acute          +---------+---------------+---------+-----------+----------+--------------+ PERO     Full                                                        +---------+---------------+---------+-----------+----------+--------------+     Summary: RIGHT: - Findings consistent with acute deep vein thrombosis involving the right posterior tibial veins, and right peroneal veins. - No cystic structure found in the popliteal fossa.  LEFT: - Findings consistent with acute deep  vein thrombosis involving the left posterior tibial veins. - No cystic structure found in the popliteal fossa.  *See table(s) above for measurements and observations. Electronically signed by Monica Martinez MD on 02/06/2020 at 3:02:24 PM.    Final       Subjective: - no chest pain, shortness of breath, no abdominal pain, nausea or vomiting.   Discharge Exam: BP (!) 175/86 (BP Location: Left Arm)   Pulse 68   Temp 98.7 F (37.1 C) (Oral)   Resp 16   Ht 5\' 10"  (1.778 m)   Wt 104.5 kg   SpO2 97%   BMI 33.05 kg/m   General: Pt is alert, awake, not in acute distress Cardiovascular: RRR, S1/S2 +, no rubs, no gallops Respiratory: CTA bilaterally, no wheezing, no rhonchi Abdominal: Soft, NT, ND, bowel sounds + Extremities: no edema, no cyanosis    The results of significant diagnostics from this hospitalization (including imaging, microbiology, ancillary and laboratory) are listed below for reference.     Microbiology: Recent Results (from the past 240 hour(s))  Respiratory Panel by RT PCR (Flu A&B, Covid) - Nasopharyngeal Swab     Status: None   Collection Time: 02/01/20  1:38 AM   Specimen: Nasopharyngeal Swab  Result Value  Ref Range Status   SARS Coronavirus 2 by RT PCR NEGATIVE NEGATIVE Final    Comment: (NOTE) SARS-CoV-2 target nucleic acids are NOT DETECTED.  The SARS-CoV-2 RNA is generally detectable in upper respiratoy specimens during the acute phase of infection. The lowest concentration of SARS-CoV-2 viral copies this assay can detect is 131 copies/mL. A negative result does not preclude SARS-Cov-2 infection and should not be used as the sole basis for treatment or other patient management decisions. A negative result may occur with  improper specimen collection/handling, submission of specimen other than nasopharyngeal swab, presence of viral mutation(s) within the areas targeted by this assay, and inadequate number of viral copies (<131 copies/mL). A negative result must be combined with clinical observations, patient history, and epidemiological information. The expected result is Negative.  Fact Sheet for Patients:  PinkCheek.be  Fact Sheet for Healthcare Providers:  GravelBags.it  This test is no t yet approved or cleared by the [place] FDA and  has been authorized for detection and/or diagnosis of SARS-CoV-2 by FDA under an Emergency Use Authorization (EUA). This EUA will remain  in effect (meaning this test can be used) for the duration of the COVID-19 declaration under Section 564(b)(1) of the Act, 21 U.S.C. section 360bbb-3(b)(1), unless the authorization is terminated or revoked sooner.     Influenza A by PCR NEGATIVE NEGATIVE Final   Influenza B by PCR NEGATIVE NEGATIVE Final    Comment: (NOTE) The Xpert Xpress SARS-CoV-2/FLU/RSV assay is intended as an aid in  the diagnosis of influenza from Nasopharyngeal swab specimens and  should not be used as a sole basis for treatment. Nasal washings and  aspirates are unacceptable for Xpert Xpress SARS-CoV-2/FLU/RSV  testing.  Fact Sheet for  Patients: PinkCheek.be  Fact Sheet for Healthcare Providers: GravelBags.it  This test is not yet approved or cleared by the [place] FDA and  has been authorized for detection and/or diagnosis of SARS-CoV-2 by  FDA under an Emergency Use Authorization (EUA). This EUA will remain  in effect (meaning this test can be used) for the duration of the  Covid-19 declaration under Section 564(b)(1) of the Act, 21  U.S.C. section 360bbb-3(b)(1), unless the authorization is  terminated or revoked. Performed at [facility], [place] [address] [place], [place] [address]   MRSA PCR Screening     Status: None   Collection Time: 02/01/20  8:20 PM   Specimen: Nasopharyngeal  Result Value Ref Range Status   MRSA by PCR NEGATIVE NEGATIVE Final    Comment:        The GeneXpert MRSA Assay (FDA approved for NASAL specimens only), is one component of a comprehensive MRSA colonization surveillance program. It is not intended to diagnose MRSA infection nor to guide or monitor treatment for MRSA infections. Performed at [facility], [place] [address] [place], [place] [address]   Respiratory Panel by RT PCR (Flu A&B, Covid) - Nasopharyngeal Swab     Status: None   Collection Time: 02/05/20 10:32 PM   Specimen: Nasopharyngeal Swab  Result Value Ref Range Status   SARS Coronavirus 2 by RT PCR NEGATIVE NEGATIVE Final    Comment: (NOTE) SARS-CoV-2 target nucleic acids are NOT DETECTED.  The SARS-CoV-2 RNA is generally detectable in upper respiratoy specimens during the acute phase of infection. The lowest concentration of SARS-CoV-2 viral copies this assay can detect is 131 copies/mL. A negative result does not preclude SARS-Cov-2 infection and should not be used as the sole basis for treatment or other patient management decisions. A negative result may occur with  improper specimen collection/handling,  submission of specimen other than nasopharyngeal swab, presence of viral mutation(s) within the areas targeted by this assay, and inadequate number of viral copies (<131 copies/mL). A negative result must be combined with clinical observations, patient history, and epidemiological information. The expected result is Negative.  Fact Sheet for Patients:  PinkCheek.be  Fact Sheet for Healthcare Providers:  GravelBags.it  This test is no t yet approved or cleared by the [place] FDA and  has been authorized for detection and/or diagnosis of SARS-CoV-2 by FDA under an Emergency Use Authorization (EUA). This EUA will remain  in effect (meaning this test can be used) for the duration of the COVID-19 declaration under Section 564(b)(1) of the Act, 21 U.S.C. section 360bbb-3(b)(1), unless the authorization is terminated or revoked sooner.     Influenza A by PCR NEGATIVE NEGATIVE Final   Influenza B by PCR NEGATIVE NEGATIVE Final    Comment: (NOTE) The Xpert Xpress SARS-CoV-2/FLU/RSV assay is intended as an aid in  the diagnosis of influenza from Nasopharyngeal swab specimens and  should not be used as a sole basis for treatment. Nasal washings and  aspirates are unacceptable for Xpert Xpress SARS-CoV-2/FLU/RSV  testing.  Fact Sheet for Patients: PinkCheek.be  Fact Sheet for Healthcare Providers: GravelBags.it  This test is not yet approved or cleared by the [place] FDA and  has been authorized for detection and/or diagnosis of SARS-CoV-2 by  FDA under an Emergency Use Authorization (EUA). This EUA will remain  in effect (meaning this test can be used) for the duration of the  Covid-19 declaration under Section 564(b)(1) of the Act, 21  U.S.C. section 360bbb-3(b)(1), unless the authorization is  terminated or revoked. Performed at [facility], [place] [address], [place],  [address]      Labs: Basic Metabolic Panel: Recent Labs  Lab 02/02/20 0239 02/02/20 0239 02/03/20 0305 02/03/20 1639 02/04/20 7628 02/05/20 2035 02/06/20 0451 02/07/20 0412 02/08/20 0519  NA 135   < > 140   < > 136 142 137 133* 136  K 3.4*   < > 3.7   < > 3.9 4.7 4.1 3.8  4.1  CL 97*   < > 99   < > 101 102 104 99 101  CO2 23   < > 28   < > 25 25 23 24 25   GLUCOSE 176*   < > 111*   < > 150* 298* 203* 71 204*  BUN 35*   < > 36*   < > 32* 24* 19 13 17   CREATININE 2.83*   < > 2.85*   < > 2.24* 1.97* 1.74* 1.73* 1.77*  CALCIUM 8.6*   < > 9.1   < > 8.9 9.2 8.4* 8.6* 8.9  MG 1.9  --  1.8  --  2.0  --  2.1  --   --   PHOS 4.0  --  5.0*  --  3.9  --   --   --   --    < > = values in this interval not displayed.   Liver Function Tests: Recent Labs  Lab 02/03/20 0305 02/04/20 0822 02/05/20 2035 02/06/20 0451 02/07/20 0412  AST 27 25 24 18 16   ALT 17 16 16 15 12   ALKPHOS 47 48 50 45 38  BILITOT 1.2 1.3* 1.0 0.9 0.8  PROT 6.4* 6.8 7.5 6.4* 6.1*  ALBUMIN 3.2* 3.1* 3.5 3.1* 2.8*   CBC: Recent Labs  Lab 02/02/20 0239 02/02/20 0239 02/03/20 0305 02/03/20 0305 02/04/20 0822 02/05/20 2035 02/06/20 0451 02/07/20 0412 02/08/20 0519  WBC 8.0   < > 6.8   < > 7.4 5.3 5.6 4.9 3.8*  NEUTROABS 5.8  --  4.8  --  5.0 3.4 3.5  --   --   HGB 11.8*   < > 11.4*   < > 10.6* 9.5* 9.3* 9.2* 8.5*  HCT 37.7*   < > 37.2*   < > 33.4* 30.6* 30.2* 29.0* 27.1*  MCV 95.2   < > 97.6   < > 94.1 95.3 95.9 94.5 93.1  PLT 190   < > 191   < > 171 179 160 187 216   < > = values in this interval not displayed.   CBG: Recent Labs  Lab 02/07/20 0755 02/07/20 1349 02/07/20 1611 02/07/20 2121 02/08/20 0751  GLUCAP 91 307* 316* 226* 156*   Hgb A1c No results for input(s): HGBA1C in the last 72 hours. Lipid Profile Recent Labs    02/06/20 0451  CHOL 274*  HDL 66  LDLCALC 180*  TRIG 138  CHOLHDL 4.2   Thyroid function studies No results for  input(s): TSH, T4TOTAL, T3FREE, THYROIDAB in the last 72 hours.  Invalid input(s): FREET3 Urinalysis    Component Value Date/Time   COLORURINE STRAW (A) 02/05/2020 1928   APPEARANCEUR CLEAR 02/05/2020 1928   LABSPEC 1.010 02/05/2020 1928   PHURINE 7.0 02/05/2020 1928   GLUCOSEU >=500 (A) 02/05/2020 1928   HGBUR SMALL (A) 02/05/2020 1928   BILIRUBINUR NEGATIVE 02/05/2020 1928   BILIRUBINUR Negative 08/13/2019 1103   KETONESUR NEGATIVE 02/05/2020 1928   PROTEINUR 100 (A) 02/05/2020 1928   UROBILINOGEN 0.2 08/13/2019 1103   UROBILINOGEN 0.2 05/09/2017 1130   NITRITE NEGATIVE 02/05/2020 1928   LEUKOCYTESUR NEGATIVE 02/05/2020 1928    FURTHER DISCHARGE INSTRUCTIONS:   Get Medicines reviewed and adjusted: Please take all your medications with you for your next visit with your Primary MD   Laboratory/radiological data: Please request your Primary MD to go over all hospital tests and procedure/radiological results at the follow up, please ask your Primary MD to get all Hospital records sent  to his/her office.   In some cases, they will be blood work, cultures and biopsy results pending at the time of your discharge. Please request that your primary care M.D. goes through all the records of your hospital data and follows up on these results.   Also Note the following: If you experience worsening of your admission symptoms, develop shortness of breath, life threatening emergency, suicidal or homicidal thoughts you must seek medical attention immediately by calling 911 or calling your MD immediately  if symptoms less severe.   You must read complete instructions/literature along with all the possible adverse reactions/side effects for all the Medicines you take and that have been prescribed to you. Take any new Medicines after you have completely understood and accpet all the possible adverse reactions/side effects.    Do not drive when taking Pain medications or sleeping medications  (Benzodaizepines)   Do not take more than prescribed Pain, Sleep and Anxiety Medications. It is not advisable to combine anxiety,sleep and pain medications without talking with your primary care practitioner   Special Instructions: If you have smoked or chewed Tobacco  in the last 2 yrs please stop smoking, stop any regular Alcohol  and or any Recreational drug use.   Wear Seat belts while driving.   Please note: You were cared for by a hospitalist during your hospital stay. Once you are discharged, your primary care physician will handle any further medical issues. Please note that NO REFILLS for any discharge medications will be authorized once you are discharged, as it is imperative that you return to your primary care physician (or establish a relationship with a primary care physician if you do not have one) for your post hospital discharge needs so that they can reassess your need for medications and monitor your lab values.  Time coordinating discharge: 35 minutes  SIGNED:  Marzetta Board, MD, PhD 02/08/2020, 8:19 AM

## 2020-02-08 NOTE — Plan of Care (Signed)
No s/s of sepsis, continue on room air and no c/o pain or discomfort. Problem: Education: Goal: Knowledge of General Education information will improve Description: Including pain rating scale, medication(s)/side effects and non-pharmacologic comfort measures Outcome: Progressing   Problem: Health Behavior/Discharge Planning: Goal: Ability to manage health-related needs will improve Outcome: Progressing   Problem: Clinical Measurements: Goal: Ability to maintain clinical measurements within normal limits will improve Outcome: Progressing Goal: Will remain free from infection Outcome: Progressing Goal: Diagnostic test results will improve Outcome: Progressing Goal: Respiratory complications will improve Outcome: Progressing Goal: Cardiovascular complication will be avoided Outcome: Progressing   Problem: Activity: Goal: Risk for activity intolerance will decrease Outcome: Progressing   Problem: Nutrition: Goal: Adequate nutrition will be maintained Outcome: Progressing   Problem: Coping: Goal: Level of anxiety will decrease Outcome: Progressing   Problem: Elimination: Goal: Will not experience complications related to bowel motility Outcome: Progressing Goal: Will not experience complications related to urinary retention Outcome: Progressing   Problem: Pain Managment: Goal: General experience of comfort will improve Outcome: Progressing   Problem: Safety: Goal: Ability to remain free from injury will improve Outcome: Progressing   Problem: Skin Integrity: Goal: Risk for impaired skin integrity will decrease Outcome: Progressing   Problem: Consults Goal: Venous Thromboembolism Patient Education Description: See Patient Education Module for education specifics. Outcome: Progressing Goal: Pharmacy Consult for anticoagulation Outcome: Progressing Goal: Skin Care Protocol Initiated - if Braden Score 18 or less Description: If consults are not indicated, leave  blank or document N/A Outcome: Progressing Goal: Nutrition Consult-if indicated Outcome: Progressing Goal: Diabetes Guidelines if Diabetic/Glucose > 140 Description: If diabetic or lab glucose is > 140 mg/dl - Initiate Diabetes/Hyperglycemia Guidelines & Document Interventions  Outcome: Progressing   Problem: Phase I Progression Outcomes Goal: Pain controlled with appropriate interventions Outcome: Progressing Goal: Dyspnea controlled at rest (PE) Outcome: Progressing Goal: Tolerating diet Outcome: Progressing Goal: Initial discharge plan identified Outcome: Progressing Goal: Voiding-avoid urinary catheter unless indicated Outcome: Progressing Goal: Hemodynamically stable Outcome: Progressing Goal: Other Phase I Outcomes/Goals Outcome: Progressing   Problem: Phase II Progression Outcomes Goal: Therapeutic drug levels for anticoagulation Outcome: Progressing Goal: 02 sats trending upward/stable (PE) Outcome: Progressing Goal: Discharge plan established Outcome: Progressing Goal: Tolerating diet Outcome: Progressing Goal: Other Phase II Outcomes/Goals Outcome: Progressing   Problem: Phase III Progression Outcomes Goal: 02 sats stabilized Outcome: Progressing Goal: Activity at appropriate level-compared to baseline Description: (UP IN CHAIR FOR HEMODIALYSIS) Outcome: Progressing Goal: Discharge plan remains appropriate-arrangements made Outcome: Progressing Goal: Other Phase III Outcomes/Goals Outcome: Progressing   Problem: Discharge Progression Outcomes Goal: Barriers To Progression Addressed/Resolved Outcome: Progressing Goal: Discharge plan in place and appropriate Outcome: Progressing Goal: Pain controlled with appropriate interventions Outcome: Progressing Goal: Hemodynamically stable Outcome: Progressing Goal: Complications resolved/controlled Outcome: Progressing Goal: Tolerating diet Outcome: Progressing Goal: Activity appropriate for discharge  plan Outcome: Progressing Goal: Other Discharge Outcomes/Goals Outcome: Progressing

## 2020-02-08 NOTE — Progress Notes (Signed)
Pt discharged at this time, PIV both removed, pt in stable condition.

## 2020-02-13 ENCOUNTER — Ambulatory Visit (HOSPITAL_COMMUNITY)
Admit: 2020-02-13 | Discharge: 2020-02-13 | Disposition: A | Discharge status: Home / Self Care | Payer: Medicare Other | Source: Ambulatory Visit | Attending: Cardiology | Admitting: Cardiology

## 2020-02-13 ENCOUNTER — Encounter (HOSPITAL_COMMUNITY): Payer: Self-pay | Admitting: Cardiology

## 2020-02-13 ENCOUNTER — Telehealth (HOSPITAL_COMMUNITY): Payer: Self-pay | Admitting: Licensed Clinical Social Worker

## 2020-02-13 ENCOUNTER — Other Ambulatory Visit: Payer: Self-pay

## 2020-02-13 VITALS — BP 152/66 | HR 82 | Wt 233.0 lb

## 2020-02-13 DIAGNOSIS — N183 Chronic kidney disease, stage 3 unspecified: Secondary | ICD-10-CM | POA: Diagnosis not present

## 2020-02-13 DIAGNOSIS — I480 Paroxysmal atrial fibrillation: Secondary | ICD-10-CM | POA: Insufficient documentation

## 2020-02-13 DIAGNOSIS — F1010 Alcohol abuse, uncomplicated: Secondary | ICD-10-CM | POA: Diagnosis not present

## 2020-02-13 DIAGNOSIS — E118 Type 2 diabetes mellitus with unspecified complications: Secondary | ICD-10-CM | POA: Diagnosis not present

## 2020-02-13 DIAGNOSIS — Z7901 Long term (current) use of anticoagulants: Secondary | ICD-10-CM | POA: Diagnosis not present

## 2020-02-13 DIAGNOSIS — Z9119 Patient's noncompliance with other medical treatment and regimen: Secondary | ICD-10-CM | POA: Insufficient documentation

## 2020-02-13 DIAGNOSIS — I130 Hypertensive heart and chronic kidney disease with heart failure and stage 1 through stage 4 chronic kidney disease, or unspecified chronic kidney disease: Secondary | ICD-10-CM | POA: Insufficient documentation

## 2020-02-13 DIAGNOSIS — H9190 Unspecified hearing loss, unspecified ear: Secondary | ICD-10-CM | POA: Diagnosis not present

## 2020-02-13 DIAGNOSIS — R0602 Shortness of breath: Secondary | ICD-10-CM | POA: Insufficient documentation

## 2020-02-13 DIAGNOSIS — I428 Other cardiomyopathies: Secondary | ICD-10-CM | POA: Insufficient documentation

## 2020-02-13 DIAGNOSIS — Z95810 Presence of automatic (implantable) cardiac defibrillator: Secondary | ICD-10-CM | POA: Insufficient documentation

## 2020-02-13 DIAGNOSIS — Z794 Long term (current) use of insulin: Secondary | ICD-10-CM | POA: Diagnosis not present

## 2020-02-13 DIAGNOSIS — Z9114 Patient's other noncompliance with medication regimen: Secondary | ICD-10-CM | POA: Diagnosis not present

## 2020-02-13 DIAGNOSIS — E785 Hyperlipidemia, unspecified: Secondary | ICD-10-CM | POA: Diagnosis not present

## 2020-02-13 DIAGNOSIS — I5022 Chronic systolic (congestive) heart failure: Secondary | ICD-10-CM | POA: Diagnosis not present

## 2020-02-13 DIAGNOSIS — Z79899 Other long term (current) drug therapy: Secondary | ICD-10-CM | POA: Diagnosis not present

## 2020-02-13 DIAGNOSIS — E1122 Type 2 diabetes mellitus with diabetic chronic kidney disease: Secondary | ICD-10-CM | POA: Diagnosis not present

## 2020-02-13 LAB — CBC
HCT: 32.2 % — ABNORMAL LOW (ref 39.0–52.0)
Hemoglobin: 9.6 g/dL — ABNORMAL LOW (ref 13.0–17.0)
MCH: 29.4 pg (ref 26.0–34.0)
MCHC: 29.8 g/dL — ABNORMAL LOW (ref 30.0–36.0)
MCV: 98.5 fL (ref 80.0–100.0)
Platelets: 350 10*3/uL (ref 150–400)
RBC: 3.27 MIL/uL — ABNORMAL LOW (ref 4.22–5.81)
RDW: 13 % (ref 11.5–15.5)
WBC: 5.9 10*3/uL (ref 4.0–10.5)
nRBC: 0.3 % — ABNORMAL HIGH (ref 0.0–0.2)

## 2020-02-13 LAB — COMPREHENSIVE METABOLIC PANEL
ALT: 22 U/L (ref 0–44)
AST: 30 U/L (ref 15–41)
Albumin: 3.6 g/dL (ref 3.5–5.0)
Alkaline Phosphatase: 47 U/L (ref 38–126)
Anion gap: 9 (ref 5–15)
BUN: 16 mg/dL (ref 6–20)
CO2: 24 mmol/L (ref 22–32)
Calcium: 9.2 mg/dL (ref 8.9–10.3)
Chloride: 103 mmol/L (ref 98–111)
Creatinine, Ser: 1.87 mg/dL — ABNORMAL HIGH (ref 0.61–1.24)
GFR, Estimated: 43 mL/min — ABNORMAL LOW (ref >60)
Glucose, Bld: 140 mg/dL — ABNORMAL HIGH (ref 70–99)
Potassium: 4 mmol/L (ref 3.5–5.1)
Sodium: 136 mmol/L (ref 135–145)
Total Bilirubin: 0.9 mg/dL (ref 0.3–1.2)
Total Protein: 7.7 g/dL (ref 6.5–8.1)

## 2020-02-13 LAB — TSH: TSH: 6.112 u[IU]/mL — ABNORMAL HIGH (ref 0.350–4.500)

## 2020-02-13 MED ORDER — FUROSEMIDE 40 MG PO TABS
40.0000 mg | ORAL_TABLET | Freq: Two times a day (BID) | ORAL | 11 refills | Status: DC
Start: 2020-02-13 — End: 2020-02-25

## 2020-02-13 MED ORDER — AMIODARONE HCL 200 MG PO TABS: 200.0000 mg | ORAL_TABLET | Freq: Every day | ORAL | 3 refills | Status: DC

## 2020-02-13 MED ORDER — ENTRESTO 24-26 MG PO TABS: 1.0000 | ORAL_TABLET | Freq: Two times a day (BID) | ORAL | 11 refills | Status: DC

## 2020-02-13 MED ORDER — ATORVASTATIN CALCIUM 80 MG PO TABS: 80.0000 mg | ORAL_TABLET | Freq: Every day | ORAL | 11 refills | Status: DC

## 2020-02-13 NOTE — Patient Instructions (Signed)
RESTART Atorvastatin 80 mg, one tab daily DECREASE Amiodarone to 200 mg, one tab daily RESTART Entresto 24/26 mg, one tab twice a day INCREASE Lasix 40 mg, one tab twice a day  Labs today We will only contact you if something comes back abnormal or we need to make some changes. Otherwise no news is good news!  Labs needed in one week  Your physician recommends that you schedule a follow-up appointment in: one week  in the [facility] Pract[facility] (PA/NP) Clinic    If you have any questions or concerns before your next appointment please send [place] a message through my chart or call our office at [phone].    TO LEAVE A MESSAGE FOR THE NURSE SELECT OPTION 2, PLEASE LEAVE A MESSAGE INCLUDING:  YOUR NAME  DATE OF BIRTH  CALL BACK NUMBER  REASON FOR CALL**this is important as we prioritize the call backs  YOU WILL RECEIVE A CALL BACK THE SAME DAY AS LONG AS YOU CALL BEFORE 4:00 PM

## 2020-02-13 NOTE — Telephone Encounter (Signed)
Pt seen in clinic today and expressed concerns with paying his electric bill- has been struggling to pay bills since he lost his job in June and his unemployment benefits stopped.  Pt forwarded CSW bill and CSW was able to assist with payment through patient care fund.  No further needs expressed at this time.  Jorge Ny, LCSW Clinical Social Worker [facility] Desk#: [phone] Cell#: [phone]

## 2020-02-13 NOTE — Progress Notes (Signed)
Paramedicine Initial Assessment:  Housing:  In what kind of housing do you live? House/apt/trailer/shelter? apt  Do you rent/pay a mortgage/own? rent  Do you live with anyone? Roommate who is never there  Are you currently worried about losing your housing? no  Within the past 12 months have you ever stayed outside, in a car, tent, a shelter, or temporarily with someone? no  Within the past 12 months have you been unable to get utilities when it was really needed? No  Has always had utilities but is struggling to pay for them following the loss of his job in June followed by the loss of his unemployment benefits.  Social:  What is your current marital status? unmarried  Do you have any children? daughter   Food:  Within the past 12 months were you ever worried that food would run out before you got money to buy more? no  Within the past 82months have you run out of food and didn't have money to buy more? No  Gets food stamps and this has been sufficient   Income:  What is your current source of income? Currently getting SSDI but states it is not very much and that he received more from his paycheck than he does from government benefits.  How hard is it for you to pay for the basics like food housing, medical care, and utilities? Somewhat hard- usually making partial payments to hold him over.  Do you have outstanding medical bills? no  Insurance:  Are you currently insured? Yes medicare and medicaid  Do you have prescription coverage? Through medicaid  Transportation:  Do you have transportation to your medical appointments? Yes- uses Medicaid transport  In the past 12 months has lack of transportation kept you from medical appts or from getting medications? no  In the past 12 months has lack of transportation kept you from meetings, work, or getting things you needed? no   Daily Health Needs: Do you have a working scale at home? yes  How do you manage your  medications at home? Puts them in a pillbox- feels as if he has a good grasp on his medications.  Do you ever take your medications differently than prescribed? no  Do you have issues affording your medications? no  If yes, has this ever prevented you from obtaining medications? n/a  Do you have any concerns with mobility at home? no  Do you use any assistive devices at home or have PCS at home? no  Do you have a PCP? Kathe Becton   Are there any additional barriers you see to getting the care you need? Not at this time  CSW will continue to follow through paramedicine program and assist as needed.  Jorge Ny, LCSW Clinical Social Worker [facility] Desk#: [phone] Cell#: [phone]

## 2020-02-14 ENCOUNTER — Telehealth (HOSPITAL_COMMUNITY): Payer: Self-pay

## 2020-02-14 DIAGNOSIS — Z79899 Other long term (current) drug therapy: Secondary | ICD-10-CM

## 2020-02-14 NOTE — Telephone Encounter (Signed)
Orders entered  Orders Placed This Encounter  Procedures  . T4, free    Standing Status:   Future    Standing Expiration Date:   02/13/2021    Order Specific Question:   Release to patient    Answer:   Immediate  . T3, free    Standing Status:   Future    Standing Expiration Date:   02/13/2021    Order Specific Question:   Release to patient    Answer:   Immediate

## 2020-02-14 NOTE — Telephone Encounter (Signed)
-----   Message from Larey Dresser, MD sent at 02/14/2020 10:44 AM EDT ----- Creatinine at baseline.  Send free T3 and free T4 with next labs.

## 2020-02-14 NOTE — Progress Notes (Signed)
Advanced Heart Failure Clinic Note   PCP: Cammie Sickle Cardiology: Dr. Aundra Dubin  49 y.o. with deafness, HTN, DM, and chronic systolic CHF presents for followup of CHF.  Patient had no known cardiac problems until 10/16.  At that time, he developed exertional dyspnea and was admitted to a hospital in [place], [place] with acute systolic CHF.  Echo 10/16 showed EF 15-20%.  Cardiac cath showed no significant coronary disease.  He was diuresed and started on cardiac meds. Subsequently, he moved to [facility].  Now working in a [employer].  Repeat echo in 5/18 showed persistently low EF, 25-30%.  He had [place] placed.   He was admitted briefly with CHF exacerbation in 8/20.   Echo in 9/20 showed EF 30-35% with diffuse hypokinesis.   Ultrasound in 4/21 showed right leg DVT and CT showed a PE.  No obvious trigger (no long trip, no surgery, no prolonged immobility).  He was started on apixaban.   He quit taking his meds in 8/21 and was drinking heavily due to depression, later restarted meds.  In 10/21, he was admitted with DKA and atrial fibrillation/RVR (1st known episode).  He converted to NSR spontaneously.  [name] was stopped due to AKI.  He had melena/hematochezia and EGD showed gastritis/esophagitis.  Apixaban was stopped.  He went home but then was re-admitted later in 10/21 with bilateral PEs.   Echo in 10/21 showed EF 45%, mild global hypokinesis, moderate LVH, mildly decreased RV systolic function.   Sign language interpreter used today.  Patient remains off Entresto and is using a lower dose of Lasix (40 mg daily) than in the past.  He is tired and short of breath after walking about 50 feet.  This is considerably worse than his baseline.  Weight is up about 4 lbs.  He has orthopnea.  No chest pain.  No lightheadedness.  BP is elevated today.   ECG (personally reviewed): NSR  Labs (9/17): BNP 477, K 4.5 => 4.2, creatinine 0.92 => 1.1, BNP 1009, HIV negative, SPEP negative.    Labs (10/17): K 5.2, creatinine 1.10, hgb 12.5 Labs (03/28/2016): K 5.3 Creatinine 1.07  Labs (3/18): digoxin < 0.2, K 4.1, creatinine 0.97 Labs (9/18): hgb 12.2, K 4, creatinine 0.99, BNP 139 Labs (1/19): hgb 11.2, K 4.8, creatinine 1.26 Labs (8/20): K 3.7, creatinine 1.69 Labs (5/21): K 5, creatinine 1.4, LDL 192 Labs (10/21): K 4.1, creatinine 1.77, LDL 180, TGs 138  PMH: 1. HTN 2. Diabetes 3. Hyperlipidemia 4. Deafness since age 21: Needs sign language interpreter.  5. Chronic systolic CHF: Nonischemic cardiomyopathy.  Diagnosis 10/16 in [place], [place] (admitted with acute systolic CHF).  SPEP and HIV negative.  [place].  - Echo (10/16): EF 15-20%, mild LVH, mild MR, normal RV size and systolic function.  - LHC (10/16): No significant CAD.  - CPX (12/17): Mild functional limitation.  - 04/2016 CMRI: EF 27%. RV normal. No evidence of infiltrative disease, myocarditis, or MI.  - Echo (5/18): EF 25-30%, mild LV dilation, mild LVH, normal RV size with mildly decreased - Echo (9/20): EF 30-35%, mild LVH, diffuse hypokinesis, normal RV size and systolic function.  - Echo (10/21): EF 45%, mild global hypokinesis, moderate LVH, mildly decreased RV systolic function.  6. Depression. 7. Colitis episodes.  8. CKD stage 3.  9. DVT/PE in 4/21: No obvious trigger.  - Recurrent PE in 10/21, had been off anticoagulation.  10. Atrial fibrillation: Paroxysmal.  11. Gastritis/esophagitis: 10/21.  12. ETOH abuse  SH: From [place] originally.  Lives in [place] until 2017, then moved to [place].  Nonsmoker.  Unemployed.  H/o ETOH abuse.    FH: No known cardiac disease.   ROS: All systems reviewed and negative except as per HPI.   Current Outpatient Medications  Medication Sig Dispense Refill  . amiodarone (PACERONE) 200 MG tablet Take 1 tablet (200 mg total) by mouth daily. 30 tablet 3  . APIXABAN (ELIQUIS) VTE STARTER PACK (10MG  AND 5MG ) Take as directed on package: start with  two-5mg  tablets twice daily for 7 days. On day 8, switch to one-5mg  tablet twice daily. 1 each 0  . atorvastatin (LIPITOR) 80 MG tablet Take 1 tablet (80 mg total) by mouth at bedtime. 30 tablet 11  . carvedilol (COREG) 25 MG tablet Take 1 tablet (25 mg total) by mouth 2 (two) times daily with a meal. 60 tablet 0  . citalopram (CELEXA) 40 MG tablet Take 40 mg by mouth daily as needed (depression).     . fenofibrate 54 MG tablet Take 1 tablet (54 mg total) by mouth daily. 30 tablet 2  . furosemide (LASIX) 40 MG tablet Take 1 tablet (40 mg total) by mouth 2 (two) times daily. 60 tablet 11  . hydrALAZINE (APRESOLINE) 25 MG tablet Take 1.5 tablets (37.5 mg total) by mouth every 8 (eight) hours. 90 tablet 0  . icosapent Ethyl (VASCEPA) 1 g capsule Take 2 capsules (2 g total) by mouth 2 (two) times daily. 120 capsule 5  . insulin glargine (LANTUS) 100 UNIT/ML injection Inject 0.24 mLs (24 Units total) into the skin daily. 10 mL 11  . latanoprost (XALATAN) 0.005 % ophthalmic solution Place 1 drop into both eyes at bedtime.    . pantoprazole (PROTONIX) 40 MG tablet Take 1 tablet (40 mg total) by mouth 2 (two) times daily. 60 tablet 0  . topiramate (TOPAMAX) 50 MG tablet TAKE 1 TABLET BY MOUTH TWICE DAILY AS NEEDED FOR HEADACHE (Patient taking differently: Take 50 mg by mouth 2 (two) times daily as needed (headache). ) 60 tablet 2  . traZODone (DESYREL) 100 MG tablet Take 1 tablet (100 mg total) by mouth at bedtime. 30 tablet 3  . Vitamin D, Ergocalciferol, (DRISDOL) 1.25 MG (50000 UNIT) CAPS capsule Take 1 capsule (50,000 Units total) by mouth every 7 (seven) days. (Patient taking differently: Take 50,000 Units by mouth every Monday. ) 5 capsule 6  . isosorbide mononitrate (IMDUR) 30 MG 24 hr tablet Take 1 tablet (30 mg total) by mouth daily. 90 tablet 3  . sacubitril-valsartan (ENTRESTO) 24-26 MG Take 1 tablet by mouth 2 (two) times daily. 60 tablet 11   No current facility-administered medications for  this encounter.   BP (!) 152/66   Pulse 82   Wt 105.7 kg (233 lb)   SpO2 98%   BMI 33.43 kg/m    Wt Readings from Last 3 Encounters:  02/13/20 105.7 kg (233 lb)  02/05/20 104.5 kg (230 lb 5 oz)  01/31/20 99.8 kg (220 lb)    General: NAD Neck: JVP 8-9 cm, no thyromegaly or thyroid nodule.  Lungs: Clear to auscultation bilaterally with normal respiratory effort. CV: Nondisplaced PMI.  Heart regular S1/S2, no S3/S4, no murmur.  1+ ankle edema.  No carotid bruit.  Normal pedal pulses.  Abdomen: Soft, nontender, no hepatosplenomegaly, no distention.  Skin: Intact without lesions or rashes.  Neurologic: Alert and oriented x 3.  Psych: Normal affect. Extremities: No clubbing or cyanosis.  HEENT: Normal.  Assessment/Plan: 1. Chronic systolic CHF: Nonischemic cardiomyopathy, etiology uncertain. Consider prior ETOH or HTN.  He has a [place].  CMRI in 1/18 with EF 27%, RV normal, no evidence of infiltrative disease, myocarditis, or MI.  Echo in 9/20 showed EF 30-35%.  Echo in 10/21 showed EF up to 45%. Despite higher EF on most recent echo, he is volume overloaded on exam with NYHA class III symptoms.  He is taking a lower dose of Lasix than in the past and is off Entresto.  - Creatinine has been lower recently, will have him restart Entresto 24/26 bid. BMET today and in 10 days.  - Increase Lasix to 40 mg bid.  - Continue Coreg 25 mg BID      - He is off spironolactone for now with prior hyperkalemia.  Will rechallenge in future with use of Lokelma or Veltasssa. - Continue hydralazine 37.5 mg tid and Imdur 30 daily. 2. Type II diabetes: Would benefit from SGLT2 inhibitor in the future.  3. HTN: BP elevated, adding back Entresto.   4. Deafness: Sign language interpreter present at visit.  5. CKD: Stage 3.  Follow creatinine closely with addition of Entresto, BMET today and again in 10 days.  6. PE/DVT: 4/21.  Unclear cause, no prolonged trip, prolonged immobility, or history  of malignancy. No FH clotting. Recurrent PE in 10/21 while Eliquis on hold with bleeding from esophagitis/gastritis.  Do no think this was an Eliquis failure.  - Continue apixaban, think he will need long-term with no clear trigger for VTE.  7. Hyperlipidemia: LDL very high when last checked, not clear that he had been taking statin.  Goal LDL < 70 with diabetes.  - Make sure he is taking atorvastatin 80 mg daily.  8. Atrial fibrillation: First noted in setting of DKA in 10/21.  He is now on amiodarone and is in NSR today.  - Decrease amiodarone to 200 mg daily.  Check LFTs, TSH today.  He will need a regular eye exam.  - Not ideal to continue amiodarone long-term, will likely stop in the future if no further AF and arrange for ablation if it recurs.  - Continue Eliquis.  9. ETOH abuse: Encouraged him to quit.  ETOH may play a role in both cardiomyopathy and atrial fibrillation.   10. Noncompliance: Significant problems with noncompliance around medication use.  Think depression/ETOH plays a role.  Will see if we can get paramedicine to follow him though deafness will be an issue.   Followup with APP in 1-2 weeks.    Loralie Champagne, MD  02/14/2020

## 2020-02-17 ENCOUNTER — Telehealth (HOSPITAL_COMMUNITY): Payer: Self-pay | Admitting: Licensed Clinical Social Worker

## 2020-02-17 ENCOUNTER — Other Ambulatory Visit (HOSPITAL_COMMUNITY): Payer: Self-pay

## 2020-02-17 NOTE — Telephone Encounter (Signed)
CSW received email from patient stating that he is having some swelling in his legs and wondering if he needs to go to the Emergency dept.  Pt is new to paramedicine list so CSW called assigned paramedic, [name], who will plan to go out this afternoon to assess swelling.  Will continue to follow and assist as needed  Jorge Ny, [facility] Desk#: [phone] Cell#: [phone]

## 2020-02-17 NOTE — Progress Notes (Signed)
Paramedicine Encounter    Patient ID: Troy Rush, male    DOB: 02-08-1971, 49 y.o.   MRN: [id number]   Patient Care Team: [name], FNP as PCP - General (Family Medicine) Larey Dresser, MD as PCP - Cardiology (Cardiology) Conrad [place], NP as Nurse Practitioner (Cardiology) Larey Dresser, MD as Consulting Physician (Cardiology)  Patient Active Problem List   Diagnosis Date Noted  . Pulmonary embolism ([place]) 02/05/2020  . Acute kidney injury superimposed on CKD ([place])   . Chronic combined systolic and diastolic heart failure ([place])   . Atrial fibrillation with RVR ([place]) 02/01/2020  . NSVT (nonsustained ventricular tachycardia) ([place]) 02/01/2020  . Acute GI bleeding 02/01/2020  . DKA (diabetic ketoacidosis) ([place]) 02/01/2020  . Bilateral deafness 08/15/2019  . Type 2 diabetes mellitus with complication, with long-term current use of insulin ([place]) 08/15/2019  . Hemoglobin A1C between 7% and 9% indicating borderline diabetic control 08/15/2019  . Insomnia 08/15/2019  . Abdominal pain 11/14/2018  . Ischemic cardiomyopathy 10/14/2016  . NICM (nonischemic cardiomyopathy) ([place]) 09/15/2016  . Chronic systolic CHF (congestive heart failure) ([place]) 05/30/2016  . HTN (hypertension) 05/30/2016  . Deaf 05/30/2016  . Hyperkalemia 05/30/2016  . Diabetes ([place]) 03/21/2016  . Major depressive disorder, recurrent episode ([place]) 03/21/2016    Current Outpatient Medications:  .  APIXABAN (ELIQUIS) VTE STARTER PACK (10MG  AND 5MG ), Take as directed on package: start with two-5mg  tablets twice daily for 7 days. On day 8, switch to one-5mg  tablet twice daily., Disp: 1 each, Rfl: 0 .  atorvastatin (LIPITOR) 80 MG tablet, Take 1 tablet (80 mg total) by mouth at bedtime., Disp: 30 tablet, Rfl: 11 .  carvedilol (COREG) 25 MG tablet, Take 1 tablet (25 mg total) by mouth 2 (two) times daily with a meal., Disp: 60 tablet, Rfl: 0 .  amiodarone (PACERONE) 200 MG tablet, Take 1 tablet (200 mg total) by  mouth daily., Disp: 30 tablet, Rfl: 3 .  citalopram (CELEXA) 40 MG tablet, Take 40 mg by mouth daily as needed (depression). , Disp: , Rfl:  .  fenofibrate 54 MG tablet, Take 1 tablet (54 mg total) by mouth daily., Disp: 30 tablet, Rfl: 2 .  furosemide (LASIX) 40 MG tablet, Take 1 tablet (40 mg total) by mouth 2 (two) times daily., Disp: 60 tablet, Rfl: 11 .  hydrALAZINE (APRESOLINE) 25 MG tablet, Take 1.5 tablets (37.5 mg total) by mouth every 8 (eight) hours., Disp: 90 tablet, Rfl: 0 .  icosapent Ethyl (VASCEPA) 1 g capsule, Take 2 capsules (2 g total) by mouth 2 (two) times daily., Disp: 120 capsule, Rfl: 5 .  insulin glargine (LANTUS) 100 UNIT/ML injection, Inject 0.24 mLs (24 Units total) into the skin daily., Disp: 10 mL, Rfl: 11 .  isosorbide mononitrate (IMDUR) 30 MG 24 hr tablet, Take 1 tablet (30 mg total) by mouth daily., Disp: 90 tablet, Rfl: 3 .  latanoprost (XALATAN) 0.005 % ophthalmic solution, Place 1 drop into both eyes at bedtime., Disp: , Rfl:  .  pantoprazole (PROTONIX) 40 MG tablet, Take 1 tablet (40 mg total) by mouth 2 (two) times daily., Disp: 60 tablet, Rfl: 0 .  sacubitril-valsartan (ENTRESTO) 24-26 MG, Take 1 tablet by mouth 2 (two) times daily., Disp: 60 tablet, Rfl: 11 .  topiramate (TOPAMAX) 50 MG tablet, TAKE 1 TABLET BY MOUTH TWICE DAILY AS NEEDED FOR HEADACHE (Patient taking differently: Take 50 mg by mouth 2 (two) times daily as needed (headache). ), Disp: 60 tablet, Rfl: 2 .  traZODone (DESYREL) 100 MG tablet, Take 1 tablet (100 mg total) by mouth at bedtime., Disp: 30 tablet, Rfl: 3 .  Vitamin D, Ergocalciferol, (DRISDOL) 1.25 MG (50000 UNIT) CAPS capsule, Take 1 capsule (50,000 Units total) by mouth every 7 (seven) days. (Patient taking differently: Take 50,000 Units by mouth every Monday. ), Disp: 5 capsule, Rfl: 6 Allergies  Allergen Reactions  . Lisinopril Cough      Social History   Socioeconomic History  . Marital status: Legally Separated    Spouse  name: Not on file  . Number of children: Not on file  . Years of education: Not on file  . Highest education level: Not on file  Occupational History  . Not on file  Tobacco Use  . Smoking status: Never Smoker  . Smokeless tobacco: Never Used  Vaping Use  . Vaping Use: Never used  Substance and Sexual Activity  . Alcohol use: Yes    Alcohol/week: 1.0 - 2.0 standard drink    Types: 1 - 2 Glasses of wine per week    Comment: rare  . Drug use: No  . Sexual activity: Yes  Other Topics Concern  . Not on file  Social History Narrative  . Not on file   Social Determinants of Health   Financial Resource Strain: Medium Risk  . Difficulty of Paying Living Expenses: Somewhat hard  Food Insecurity: No Food Insecurity  . Worried About Charity fundraiser in the Last Year: Never true  . Ran Out of Food in the Last Year: Never true  Transportation Needs: No Transportation Needs  . Lack of Transportation (Medical): No  . Lack of Transportation (Non-Medical): No  Physical Activity:   . Days of Exercise per Week: Not on file  . Minutes of Exercise per Session: Not on file  Stress:   . Feeling of Stress : Not on file  Social Connections:   . Frequency of Communication with Friends and Family: Not on file  . Frequency of Social Gatherings with Friends and Family: Not on file  . Attends Religious Services: Not on file  . Active Member of Clubs or Organizations: Not on file  . Attends Archivist Meetings: Not on file  . Marital Status: Not on file  Intimate Partner Violence:   . Fear of Current or Ex-Partner: Not on file  . Emotionally Abused: Not on file  . Physically Abused: Not on file  . Sexually Abused: Not on file    Physical Exam      Future Appointments  Date Time Provider [place]  02/18/2020  3:30 PM Oliver Barre, [place] [facility] None  02/20/2020 10:30 AM [facility] PA/NP [facility] None  02/24/2020  1:40 PM [name], FNP [facility] None  03/23/2020   8:35 AM [facility] DEVICE REMOTES [facility]  06/22/2020  8:35 AM [facility] DEVICE REMOTES [facility]  09/21/2020  8:35 AM [facility] DEVICE REMOTES [facility]  12/21/2020  8:35 AM [facility] DEVICE REMOTES [facility]  03/22/2021  8:35 AM [facility] DEVICE REMOTES [facility]  06/21/2021  8:35 AM [facility] DEVICE REMOTES [facility]  09/20/2021  8:35 AM [facility] DEVICE REMOTES [facility]  12/20/2021  8:35 AM [facility] DEVICE REMOTES [facility]    BP (!) 170/110   Pulse 88   Resp 20   Wt 231 lb (104.8 kg)   SpO2 98%   BMI 33.15 kg/m  CBG 99 Weight yesterday-? Last visit weight-233  First visit-pt is deaf-used word to type  out conversation.  He was asleep when I arrived.  He appears very drowsy.  He states he does drink alcohol some. Advised him to cut back.  His amio is out-the rx was written for 14 days and no refills.  I seen it was to be continued after his last clinic visit.   No c/p. He is dizzy. Med check and he is not taking meds accurately. Increased sob. Legs feel tight/swollen. He doesn't weigh daily. Not checking CBG.  Not sure when he filled his pill box up but it appears he has missed several doses since the meds are scattered.   Feels congested.  Out of amio-has through thursday--not taking eliquis BID.  Taking 1 hydralazine tabs BID.   Carvedilol was 12.5 BID.  So he is not taking many as listed.   Do not see the celexa.  His pill box is being filled as listed in epic per dr Aundra Dubin.  He has metformin bottle and invokana bottle but no longer is taking them.  We put XXX's on them so he knows not to take.   He took the mid day dose of meds during visit. I showed him the TID meds. I typed this out on computer so he read it and understood.  I called pharmacy about amio but I was on hold for 30 min total of both my phone calls to them and then they  hung up on me both times so I will just have to go there and see if I can p/u for him.    [name], [facility] Paramedic  02/17/20

## 2020-02-18 ENCOUNTER — Other Ambulatory Visit: Payer: Self-pay

## 2020-02-18 ENCOUNTER — Encounter (HOSPITAL_COMMUNITY): Payer: Self-pay | Admitting: Nurse Practitioner

## 2020-02-18 ENCOUNTER — Ambulatory Visit (HOSPITAL_COMMUNITY)
Admission: RE | Admit: 2020-02-18 | Discharge: 2020-02-18 | Disposition: A | Discharge status: Home / Self Care | Payer: Medicare Other | Source: Ambulatory Visit | Attending: Nurse Practitioner | Admitting: Nurse Practitioner

## 2020-02-18 VITALS — BP 155/99 | Ht 70.0 in | Wt 235.0 lb

## 2020-02-18 DIAGNOSIS — D6869 Other thrombophilia: Secondary | ICD-10-CM

## 2020-02-18 DIAGNOSIS — Z7901 Long term (current) use of anticoagulants: Secondary | ICD-10-CM | POA: Insufficient documentation

## 2020-02-18 DIAGNOSIS — I4891 Unspecified atrial fibrillation: Secondary | ICD-10-CM | POA: Diagnosis not present

## 2020-02-18 DIAGNOSIS — Z794 Long term (current) use of insulin: Secondary | ICD-10-CM | POA: Diagnosis not present

## 2020-02-18 DIAGNOSIS — Z79899 Other long term (current) drug therapy: Secondary | ICD-10-CM | POA: Diagnosis not present

## 2020-02-18 DIAGNOSIS — Z8249 Family history of ischemic heart disease and other diseases of the circulatory system: Secondary | ICD-10-CM | POA: Diagnosis not present

## 2020-02-18 DIAGNOSIS — Z888 Allergy status to other drugs, medicaments and biological substances status: Secondary | ICD-10-CM | POA: Insufficient documentation

## 2020-02-18 DIAGNOSIS — I5042 Chronic combined systolic (congestive) and diastolic (congestive) heart failure: Secondary | ICD-10-CM | POA: Insufficient documentation

## 2020-02-18 DIAGNOSIS — I110 Hypertensive heart disease with heart failure: Secondary | ICD-10-CM | POA: Diagnosis not present

## 2020-02-18 DIAGNOSIS — Z86711 Personal history of pulmonary embolism: Secondary | ICD-10-CM | POA: Diagnosis not present

## 2020-02-18 MED ORDER — AMIODARONE HCL 200 MG PO TABS
200.0000 mg | ORAL_TABLET | Freq: Every day | ORAL | 6 refills | Status: DC
Start: 2020-02-18 — End: 2020-03-18

## 2020-02-18 NOTE — Progress Notes (Addendum)
Primary Care Physician: [name], FNP Referring Physician: [facility] f/u [facility]- Dr. Precious Bard, NP    Troy Rush is a 49 y.o. male with a h/o deafness,vitamin D deficiency, recurrent pancreatitis, nonischemic cardiomyopathy,  CHF, last echo showing ejection fraction 30 to 35%, hypertension, hyperlipidemia, DVT and PE, diabetes mellitus type 2, AICD placement, who presented  to the emergency room 10/26, with a chief complaint of nausea,vomiting and hematemesis. He had been on eliquis for h/o PE and was found to have new onset afib with RVR. He was started on amiodarone drip from which he spontaneously converted, he was switched to po amiodarone. He was seen by Dr. Aundra Dubin 11/5 and was in SR at that time. Amiodarone was reduced to 200 mg daily.  EKG today shows SR He did place a call yesterday to  the SW stating that he was having some swelling of his LE's. Paramedic visit this am was arranged and he found he was taking his diuretic just once once a day and he was not taking his eliquis. He was not taking his entresto. He was only taking his carvedilol at 12.5  mg bid. She assisted him with filling his pill box correctly. He is now in the afib clinic to f/u amio  load with the help of  an interpretor. He is in SR and has not noted any afib. His biggest complaint is that of swollen LE's and with increase of his diuretic to BID , this should resolve the swelling.   Today, he denies symptoms of palpitations, chest pain, shortness of breath, orthopnea, PND,+ lower extremity edema,- dizziness, presyncope, syncope, or neurologic sequela. The patient is tolerating medications without difficulties and is otherwise without complaint today.   Past Medical History:  Diagnosis Date  . AICD (automatic cardioverter/defibrillator) present 10/14/2016  . Bilateral deafness   . Bronchitis   . CHF (congestive heart failure) ([place])   . Depression   . Diabetes mellitus without complication ([place])   . DVT,  lower extremity ([place]) 07/12/2019  . Dyslipidemia   . Headache 08/2019  . Hypertension   . Insomnia 08/2019  . NICM (nonischemic cardiomyopathy) ([place]) 09/15/2016  . Pancreatitis   . Vitamin D deficiency 08/2019   Past Surgical History:  Procedure Laterality Date  . BACK SURGERY    . CARDIAC CATHETERIZATION    . ESOPHAGOGASTRODUODENOSCOPY (EGD) WITH PROPOFOL N/A 02/02/2020   Procedure: ESOPHAGOGASTRODUODENOSCOPY (EGD) WITH PROPOFOL;  Surgeon: Arta Silence, MD;  Location: [facility];  Service: Endoscopy;  Laterality: N/A;  . ICD IMPLANT  10/14/2016  . ICD IMPLANT N/A 10/14/2016   Procedure: ICD Implant;  Surgeon: Deboraha Sprang, MD;  Location: [facility];  Service: Cardiovascular;  Laterality: N/A;  . WRIST SURGERY      Current Outpatient Medications  Medication Sig Dispense Refill  . amiodarone (PACERONE) 200 MG tablet Take 1 tablet (200 mg total) by mouth daily. 30 tablet 3  . APIXABAN (ELIQUIS) VTE STARTER PACK (10MG  AND 5MG ) Take as directed on package: start with two-5mg  tablets twice daily for 7 days. On day 8, switch to one-5mg  tablet twice daily. 1 each 0  . atorvastatin (LIPITOR) 80 MG tablet Take 1 tablet (80 mg total) by mouth at bedtime. 30 tablet 11  . carvedilol (COREG) 25 MG tablet Take 1 tablet (25 mg total) by mouth 2 (two) times daily with a meal. 60 tablet 0  . citalopram (CELEXA) 40 MG tablet Take 40 mg by mouth daily as needed (  depression).  (Patient not taking: Reported on 02/17/2020)    . fenofibrate 54 MG tablet Take 1 tablet (54 mg total) by mouth daily. 30 tablet 2  . furosemide (LASIX) 40 MG tablet Take 1 tablet (40 mg total) by mouth 2 (two) times daily. 60 tablet 11  . hydrALAZINE (APRESOLINE) 25 MG tablet Take 1.5 tablets (37.5 mg total) by mouth every 8 (eight) hours. 90 tablet 0  . icosapent Ethyl (VASCEPA) 1 g capsule Take 2 capsules (2 g total) by mouth 2 (two) times daily. 120 capsule 5  . insulin glargine (LANTUS) 100 UNIT/ML injection Inject  0.24 mLs (24 Units total) into the skin daily. 10 mL 11  . isosorbide mononitrate (IMDUR) 30 MG 24 hr tablet Take 1 tablet (30 mg total) by mouth daily. 90 tablet 3  . latanoprost (XALATAN) 0.005 % ophthalmic solution Place 1 drop into both eyes at bedtime.    . pantoprazole (PROTONIX) 40 MG tablet Take 1 tablet (40 mg total) by mouth 2 (two) times daily. 60 tablet 0  . sacubitril-valsartan (ENTRESTO) 24-26 MG Take 1 tablet by mouth 2 (two) times daily. 60 tablet 11  . topiramate (TOPAMAX) 50 MG tablet TAKE 1 TABLET BY MOUTH TWICE DAILY AS NEEDED FOR HEADACHE (Patient not taking: Reported on 02/17/2020) 60 tablet 2  . traZODone (DESYREL) 100 MG tablet Take 1 tablet (100 mg total) by mouth at bedtime. (Patient not taking: Reported on 02/17/2020) 30 tablet 3  . Vitamin D, Ergocalciferol, (DRISDOL) 1.25 MG (50000 UNIT) CAPS capsule Take 1 capsule (50,000 Units total) by mouth every 7 (seven) days. (Patient taking differently: Take 50,000 Units by mouth every Monday. ) 5 capsule 6   No current facility-administered medications for this encounter.    Allergies  Allergen Reactions  . Lisinopril Cough    Social History   Socioeconomic History  . Marital status: Legally Separated    Spouse name: Not on file  . Number of children: Not on file  . Years of education: Not on file  . Highest education level: Not on file  Occupational History  . Not on file  Tobacco Use  . Smoking status: Never Smoker  . Smokeless tobacco: Never Used  Vaping Use  . Vaping Use: Never used  Substance and Sexual Activity  . Alcohol use: Yes    Alcohol/week: 1.0 - 2.0 standard drink    Types: 1 - 2 Glasses of wine per week    Comment: rare  . Drug use: No  . Sexual activity: Yes  Other Topics Concern  . Not on file  Social History Narrative  . Not on file   Social Determinants of Health   Financial Resource Strain: Medium Risk  . Difficulty of Paying Living Expenses: Somewhat hard  Food Insecurity: No  Food Insecurity  . Worried About Charity fundraiser in the Last Year: Never true  . Ran Out of Food in the Last Year: Never true  Transportation Needs: No Transportation Needs  . Lack of Transportation (Medical): No  . Lack of Transportation (Non-Medical): No  Physical Activity:   . Days of Exercise per Week: Not on file  . Minutes of Exercise per Session: Not on file  Stress:   . Feeling of Stress : Not on file  Social Connections:   . Frequency of Communication with Friends and Family: Not on file  . Frequency of Social Gatherings with Friends and Family: Not on file  . Attends Religious Services: Not on file  .  Active Member of Clubs or Organizations: Not on file  . Attends Archivist Meetings: Not on file  . Marital Status: Not on file  Intimate Partner Violence:   . Fear of Current or Ex-Partner: Not on file  . Emotionally Abused: Not on file  . Physically Abused: Not on file  . Sexually Abused: Not on file    Family History  Problem Relation Age of Onset  . Hypertension Mother   . Healthy Father   . Stroke Father     ROS- All systems are reviewed and negative except as per the HPI above  Physical Exam: There were no vitals filed for this visit. Wt Readings from Last 3 Encounters:  02/17/20 104.8 kg  02/13/20 105.7 kg  02/05/20 104.5 kg    Labs: Lab Results  Component Value Date   NA 136 02/13/2020   K 4.0 02/13/2020   CL 103 02/13/2020   CO2 24 02/13/2020   GLUCOSE 140 (H) 02/13/2020   BUN 16 02/13/2020   CREATININE 1.87 (H) 02/13/2020   CALCIUM 9.2 02/13/2020   PHOS 3.9 02/04/2020   MG 2.1 02/06/2020   Lab Results  Component Value Date   INR 1.0 02/06/2020   Lab Results  Component Value Date   CHOL 274 (H) 02/06/2020   HDL 66 02/06/2020   LDLCALC 180 (H) 02/06/2020   TRIG 138 02/06/2020     GEN- The patient is well appearing, alert and oriented x 3 today.   Head- normocephalic, atraumatic Eyes-  Sclera clear, conjunctiva  pink Ears- hearing intact Oropharynx- clear Neck- supple, no JVP Lymph- no cervical lymphadenopathy Lungs- Clear to ausculation bilaterally, normal work of breathing Heart- Regular rate and rhythm, no murmurs, rubs or gallops, PMI not laterally displaced GI- soft, NT, ND, + BS Extremities- no clubbing, cyanosis, or edema MS- no significant deformity or atrophy Skin- no rash or lesion Psych- euthymic mood, full affect Neuro- strength and sensation are intact  EKG-NSR at 86 bpm, pr int 192 ms, qrs int 86 ms, qtc 485 ms     Assessment and Plan: 1. New onset afib with RVR  He was loaded on IV amiodarone and converted to SR and now is on 200 mg amiodarone daily, refills sent  in for pt  He is staying in SR He should be on carvedilol 25 mg bid , he has been only taken 12.5 mg bid but this has been corrected by paramedic  2. Chronic combined systolic and diastolic HF C/o LLE Had been taking lasix only one time a day  This was corrected by Paramedic  to take 2x daily He had not started entresto as well   3. H/o PE and CHA2DS2VASc score of at least 5 He has stopped his eliquis by mistake This is now back in his pill box as well   F/u in HF clinic 11/11 Afib clinic as needed   Butch Penny C. Sheniece Ruggles, [facility] [address] [place], [place] [address] [phone]

## 2020-02-20 ENCOUNTER — Ambulatory Visit (HOSPITAL_COMMUNITY)
Admission: RE | Admit: 2020-02-20 | Discharge: 2020-02-20 | Disposition: A | Discharge status: Home / Self Care | Payer: Medicare Other | Source: Ambulatory Visit | Attending: Cardiology | Admitting: Cardiology

## 2020-02-20 ENCOUNTER — Other Ambulatory Visit: Payer: Self-pay

## 2020-02-20 ENCOUNTER — Encounter (HOSPITAL_COMMUNITY): Payer: Self-pay

## 2020-02-20 ENCOUNTER — Other Ambulatory Visit (HOSPITAL_COMMUNITY): Payer: Self-pay

## 2020-02-20 VITALS — BP 114/78 | HR 74 | Wt 228.8 lb

## 2020-02-20 DIAGNOSIS — E785 Hyperlipidemia, unspecified: Secondary | ICD-10-CM | POA: Insufficient documentation

## 2020-02-20 DIAGNOSIS — E1122 Type 2 diabetes mellitus with diabetic chronic kidney disease: Secondary | ICD-10-CM | POA: Insufficient documentation

## 2020-02-20 DIAGNOSIS — Z7901 Long term (current) use of anticoagulants: Secondary | ICD-10-CM | POA: Insufficient documentation

## 2020-02-20 DIAGNOSIS — I480 Paroxysmal atrial fibrillation: Secondary | ICD-10-CM | POA: Diagnosis not present

## 2020-02-20 DIAGNOSIS — Z794 Long term (current) use of insulin: Secondary | ICD-10-CM | POA: Insufficient documentation

## 2020-02-20 DIAGNOSIS — F329 Major depressive disorder, single episode, unspecified: Secondary | ICD-10-CM | POA: Insufficient documentation

## 2020-02-20 DIAGNOSIS — Z95810 Presence of automatic (implantable) cardiac defibrillator: Secondary | ICD-10-CM | POA: Insufficient documentation

## 2020-02-20 DIAGNOSIS — Z86718 Personal history of other venous thrombosis and embolism: Secondary | ICD-10-CM | POA: Diagnosis not present

## 2020-02-20 DIAGNOSIS — I5022 Chronic systolic (congestive) heart failure: Secondary | ICD-10-CM | POA: Insufficient documentation

## 2020-02-20 DIAGNOSIS — I5042 Chronic combined systolic (congestive) and diastolic (congestive) heart failure: Secondary | ICD-10-CM

## 2020-02-20 DIAGNOSIS — Z9119 Patient's noncompliance with other medical treatment and regimen: Secondary | ICD-10-CM | POA: Insufficient documentation

## 2020-02-20 DIAGNOSIS — N183 Chronic kidney disease, stage 3 unspecified: Secondary | ICD-10-CM | POA: Diagnosis not present

## 2020-02-20 DIAGNOSIS — H9190 Unspecified hearing loss, unspecified ear: Secondary | ICD-10-CM | POA: Diagnosis not present

## 2020-02-20 DIAGNOSIS — Z9114 Patient's other noncompliance with medication regimen: Secondary | ICD-10-CM | POA: Diagnosis not present

## 2020-02-20 DIAGNOSIS — K2091 Esophagitis, unspecified with bleeding: Secondary | ICD-10-CM | POA: Diagnosis not present

## 2020-02-20 DIAGNOSIS — I130 Hypertensive heart and chronic kidney disease with heart failure and stage 1 through stage 4 chronic kidney disease, or unspecified chronic kidney disease: Secondary | ICD-10-CM | POA: Diagnosis not present

## 2020-02-20 DIAGNOSIS — Z8719 Personal history of other diseases of the digestive system: Secondary | ICD-10-CM | POA: Insufficient documentation

## 2020-02-20 DIAGNOSIS — Z79899 Other long term (current) drug therapy: Secondary | ICD-10-CM | POA: Insufficient documentation

## 2020-02-20 DIAGNOSIS — I428 Other cardiomyopathies: Secondary | ICD-10-CM | POA: Insufficient documentation

## 2020-02-20 LAB — BASIC METABOLIC PANEL
Anion gap: 11 (ref 5–15)
BUN: 30 mg/dL — ABNORMAL HIGH (ref 6–20)
CO2: 26 mmol/L (ref 22–32)
Calcium: 9.5 mg/dL (ref 8.9–10.3)
Chloride: 98 mmol/L (ref 98–111)
Creatinine, Ser: 2.12 mg/dL — ABNORMAL HIGH (ref 0.61–1.24)
GFR, Estimated: 37 mL/min — ABNORMAL LOW (ref >60)
Glucose, Bld: 192 mg/dL — ABNORMAL HIGH (ref 70–99)
Potassium: 4.3 mmol/L (ref 3.5–5.1)
Sodium: 135 mmol/L (ref 135–145)

## 2020-02-20 LAB — TSH: TSH: 6.463 u[IU]/mL — ABNORMAL HIGH (ref 0.350–4.500)

## 2020-02-20 LAB — T4, FREE: Free T4: 0.94 ng/dL (ref 0.61–1.12)

## 2020-02-20 MED ORDER — DAPAGLIFLOZIN PROPANEDIOL 10 MG PO TABS: 10.0000 mg | ORAL_TABLET | Freq: Every day | ORAL | 11 refills | Status: DC

## 2020-02-20 NOTE — Progress Notes (Signed)
ReDS Vest / Clip - 02/20/20 1053      ReDS Vest / Clip   Station Marker D    Ruler Value 35    ReDS Value Range Moderate volume overload    ReDS Actual Value 32    Anatomical Comments sitting

## 2020-02-20 NOTE — Progress Notes (Signed)
Advanced Heart Failure Clinic Note   PCP: Cammie Sickle Cardiology: Dr. Aundra Dubin  49 y.o. with deafness, HTN, DM, and chronic systolic CHF presents for followup of CHF.  Patient had no known cardiac problems until 10/16.  At that time, he developed exertional dyspnea and was admitted to a hospital in [place], [place] with acute systolic CHF.  Echo 10/16 showed EF 15-20%.  Cardiac cath showed no significant coronary disease.  He was diuresed and started on cardiac meds. Subsequently, he moved to [facility].  Now working in a [employer].  Repeat echo in 5/18 showed persistently low EF, 25-30%.  He had [place] placed.   He was admitted briefly with CHF exacerbation in 8/20.   Echo in 9/20 showed EF 30-35% with diffuse hypokinesis.   Ultrasound in 4/21 showed right leg DVT and CT showed a PE.  No obvious trigger (no long trip, no surgery, no prolonged immobility).  He was started on apixaban.   He quit taking his meds in 8/21 and was drinking heavily due to depression, later restarted meds.  In 10/21, he was admitted with DKA and atrial fibrillation/RVR (1st known episode).  He converted to NSR spontaneously.  [name] was stopped due to AKI.  He had melena/hematochezia and EGD showed gastritis/esophagitis.  Apixaban was stopped.  He went home but then was re-admitted later in 10/21 with bilateral PEs.   Echo in 10/21 showed EF 45%, mild global hypokinesis, moderate LVH, mildly decreased RV systolic function.   Seen last week by Dr. Aundra Dubin on 11/4 and was found to be fluid overloaded. Wt was up 4 lb and he was symptomatic w/ exertional dyspnea and orthopnea. Entresto was restarted at 24-26 mg bid and Lasix increased to 40 mg bid. He presents back to clinic today for f/u. Sign language interpreter used today.  He reports some improvement in symptoms, able to walk on flat ground w/o dyspnea but still struggles up inclines. LEE has resolved. Wt is down 7 lb and His ReDs clip reading is WNL  today at 32%. BP well controlled at 114/78. He is tolerating Entresto ok. He has occasional mild positional dizziness when he stands but quick resolves. No syncope/ near syncope.    ECG (personally reviewed): NSR 72 bpm, LVH   Labs (9/17): BNP 477, K 4.5 => 4.2, creatinine 0.92 => 1.1, BNP 1009, HIV negative, SPEP negative.  Labs (10/17): K 5.2, creatinine 1.10, hgb 12.5 Labs (03/28/2016): K 5.3 Creatinine 1.07  Labs (3/18): digoxin < 0.2, K 4.1, creatinine 0.97 Labs (9/18): hgb 12.2, K 4, creatinine 0.99, BNP 139 Labs (1/19): hgb 11.2, K 4.8, creatinine 1.26 Labs (8/20): K 3.7, creatinine 1.69 Labs (5/21): K 5, creatinine 1.4, LDL 192 Labs (10/21): K 4.1, creatinine 1.77, LDL 180, TGs 138  PMH: 1. HTN 2. Diabetes 3. Hyperlipidemia 4. Deafness since age 11: Needs sign language interpreter.  5. Chronic systolic CHF: Nonischemic cardiomyopathy.  Diagnosis 10/16 in [place], [place] (admitted with acute systolic CHF).  SPEP and HIV negative.  [place].  - Echo (10/16): EF 15-20%, mild LVH, mild MR, normal RV size and systolic function.  - LHC (10/16): No significant CAD.  - CPX (12/17): Mild functional limitation.  - 04/2016 CMRI: EF 27%. RV normal. No evidence of infiltrative disease, myocarditis, or MI.  - Echo (5/18): EF 25-30%, mild LV dilation, mild LVH, normal RV size with mildly decreased - Echo (9/20): EF 30-35%, mild LVH, diffuse hypokinesis, normal RV size and systolic function.  - Echo (  10/21): EF 45%, mild global hypokinesis, moderate LVH, mildly decreased RV systolic function.  6. Depression. 7. Colitis episodes.  8. CKD stage 3.  9. DVT/PE in 4/21: No obvious trigger.  - Recurrent PE in 10/21, had been off anticoagulation.  10. Atrial fibrillation: Paroxysmal.  11. Gastritis/esophagitis: 10/21.  12. ETOH abuse  SH: From [place] originally.  Lives in [place] until 2017, then moved to [place].  Nonsmoker.  Unemployed.  H/o ETOH abuse.    FH: No known cardiac  disease.   ROS: All systems reviewed and negative except as per HPI.   Current Outpatient Medications  Medication Sig Dispense Refill  . amiodarone (PACERONE) 200 MG tablet Take 1 tablet (200 mg total) by mouth daily. 30 tablet 6  . apixaban (ELIQUIS) 5 MG TABS tablet Take 5 mg by mouth 2 (two) times daily.    [place] atorvastatin (LIPITOR) 80 MG tablet Take 1 tablet (80 mg total) by mouth at bedtime. 30 tablet 11  . carvedilol (COREG) 25 MG tablet Take 1 tablet (25 mg total) by mouth 2 (two) times daily with a meal. 60 tablet 0  . citalopram (CELEXA) 40 MG tablet Take 40 mg by mouth daily as needed (depression).     . fenofibrate 54 MG tablet Take 1 tablet (54 mg total) by mouth daily. 30 tablet 2  . furosemide (LASIX) 40 MG tablet Take 1 tablet (40 mg total) by mouth 2 (two) times daily. 60 tablet 11  . hydrALAZINE (APRESOLINE) 25 MG tablet Take 1.5 tablets (37.5 mg total) by mouth every 8 (eight) hours. 90 tablet 0  . icosapent Ethyl (VASCEPA) 1 g capsule Take 2 capsules (2 g total) by mouth 2 (two) times daily. 120 capsule 5  . insulin glargine (LANTUS) 100 UNIT/ML injection Inject 0.24 mLs (24 Units total) into the skin daily. 10 mL 11  . isosorbide mononitrate (IMDUR) 30 MG 24 hr tablet Take 1 tablet (30 mg total) by mouth daily. 90 tablet 3  . latanoprost (XALATAN) 0.005 % ophthalmic solution Place 1 drop into both eyes at bedtime.    . pantoprazole (PROTONIX) 40 MG tablet Take 1 tablet (40 mg total) by mouth 2 (two) times daily. 60 tablet 0  . sacubitril-valsartan (ENTRESTO) 24-26 MG Take 1 tablet by mouth 2 (two) times daily. 60 tablet 11  . Vitamin D, Ergocalciferol, (DRISDOL) 1.25 MG (50000 UNIT) CAPS capsule Take 1 capsule (50,000 Units total) by mouth every 7 (seven) days. (Patient taking differently: Take 50,000 Units by mouth every Monday. ) 5 capsule 6   No current facility-administered medications for this encounter.   BP 114/78   Pulse 74   Wt 103.8 kg (228 lb 12.8 oz)   SpO2  98%   BMI 32.83 kg/m    Wt Readings from Last 3 Encounters:  02/20/20 103.8 kg (228 lb 12.8 oz)  02/18/20 106.6 kg (235 lb)  02/17/20 104.8 kg (231 lb)     PHYSICAL EXAM: ReDS Clip 32%  General:  Well appearing. No respiratory difficulty HEENT: deaf, otherwise normal Neck: supple. no JVD. Carotids 2+ bilat; no bruits. No lymphadenopathy or thyromegaly appreciated. Cor: PMI nondisplaced. Regular rate & rhythm. No rubs, gallops or murmurs. Lungs: clear Abdomen: soft, nontender, nondistended. No hepatosplenomegaly. No bruits or masses. Good bowel sounds. Extremities: no cyanosis, clubbing, rash, edema Neuro: alert & oriented x 3, cranial nerves grossly intact. moves all 4 extremities w/o difficulty. Affect pleasant.   Assessment/Plan: 1. Chronic systolic CHF: Nonischemic cardiomyopathy, etiology uncertain. Consider prior ETOH  or HTN.  He has a [place].  cMRI in 1/18 with EF 27%, RV normal, no evidence of infiltrative disease, myocarditis, or MI.  Echo in 9/20 showed EF 30-35%.  Echo in 10/21 showed EF up to 45%. Volume status improved after recent diuretic adjustments. Wt down 7 lb and ReDs clip normal at 32% - Continue Entresto 24-26 mg bid. Will hold off on titration w/ soft BP and positional dizziness - Continue Lasix to 40 mg bid.  - Continue Coreg 25 mg BID      - He is off spironolactone for now with prior hyperkalemia.  May consider rechallenge in future with use of Lokelma or Veltasssa.  - Continue hydralazine 37.5 mg tid and Imdur 30 daily. - avoid SGLT2i for now given Hgb A1c >10 (11.0 on 02/01/20) - Check BMP today  2. Type II diabetes: management per PCP.  On insulin. Would benefit from SGLT2 inhibitor in the future if his Hgb A1c drops below 10. Would avoid for now given increased risk for GU infections.   3. HTN: controlled on current regimen.  - No changes made today  - check BMP  4. Deafness: Sign language interpreter present at visit.  5. CKD: Stage 3.   Last SCr 1.8. Check BMP today  6. PE/DVT: 4/21.  Unclear cause, no prolonged trip, prolonged immobility, or history of malignancy. No FH clotting. Recurrent PE in 10/21 while Eliquis on hold with bleeding from esophagitis/gastritis.  Do no think this was an Eliquis failure.  - Continue apixaban, think he will need long-term with no clear trigger for VTE.  7. Hyperlipidemia: LDL very high when last checked, not clear that he had been taking statin.  Goal LDL < 70 with diabetes.  - continue atorvastatin 80 mg daily (paramedicine now helping w/ med compliance)  - repeat FLP in 6 weeks, if not at goal add Zetia +/- [id number]  8. Atrial fibrillation: First noted in setting of DKA in 10/21.  He is now on amiodarone and is in NSR today.  - Continue amiodarone  200 mg daily.  Recent TSH elevated, check F/u Free T3/T4 today. HFTs ok.  He will need a regular eye exam.  - Not ideal to continue amiodarone long-term, will likely stop in the future if no further AF and arrange for ablation if it recurs.  - Continue Eliquis.  9. ETOH abuse: Encouraged him to quit.  ETOH may play a role in both cardiomyopathy and atrial fibrillation.   10. Noncompliance: Significant problems with noncompliance around medication use.   - paramedicine now following, appreciate their assistance  F/u w/ pharmD for attempt at further med titration   Lyda Jester, PA-C  02/20/2020

## 2020-02-20 NOTE — Progress Notes (Signed)
Paramedicine Encounter   Patient ID: Troy Rush , male,   DOB: Nov 12, 1970,49 y.o.,  MRN: [id number]   Met patient in clinic today with provider.  B/p-114/78 p-74 sp02-98 Weight @ clinic-228 REDS vest-32%  Pt is here for f/u for increased swelling and sob.  He has missed 1 afternoon dose that has his lasix in there and has missed a couple of the nighttime doses of meds.  I placed in pill box on Monday the meds as listed in epic per clinic.  His weight is still up some but he feels better than he did yesterday.  Repeat labs today.  If labs are ok then will start farxiga Monday.  Repeat labs 10 days post [place], [place] Paramedic  02/20/20

## 2020-02-20 NOTE — Patient Instructions (Addendum)
START Farxiga 10 mg ,one tab daily  Labs today We will only contact you if something comes back abnormal or we need to make some changes. Otherwise no news is good news!  Labs needed in 10 days  Your physician recommends that you schedule a follow-up appointment in: 3-4 weeks with the pharmacy team  Your physician recommends that you schedule a follow-up appointment in: 8-10 weeks  in the Advanced Practitioners (PA/NP) Clinic    If you have any questions or concerns before your next appointment please send [place] a message through [employer] [facility] or call our office at [phone].    TO LEAVE A MESSAGE FOR THE NURSE SELECT OPTION 2, PLEASE LEAVE A MESSAGE INCLUDING: . YOUR NAME . DATE OF BIRTH . CALL BACK NUMBER . REASON FOR CALL**this is important as we prioritize the call backs  YOU WILL RECEIVE A CALL BACK THE SAME DAY AS LONG AS YOU CALL BEFORE 4:00 PM

## 2020-02-21 ENCOUNTER — Encounter (HOSPITAL_COMMUNITY): Payer: Self-pay

## 2020-02-21 ENCOUNTER — Other Ambulatory Visit (HOSPITAL_COMMUNITY): Payer: Self-pay

## 2020-02-21 LAB — T3, FREE: T3, Free: 3.3 pg/mL (ref 2.0–4.4)

## 2020-02-24 ENCOUNTER — Ambulatory Visit (INDEPENDENT_AMBULATORY_CARE_PROVIDER_SITE_OTHER): Payer: Medicare Other | Admitting: Family Medicine

## 2020-02-24 ENCOUNTER — Other Ambulatory Visit (HOSPITAL_COMMUNITY): Payer: Self-pay

## 2020-02-24 ENCOUNTER — Encounter: Payer: Self-pay | Admitting: Family Medicine

## 2020-02-24 ENCOUNTER — Other Ambulatory Visit: Payer: Self-pay

## 2020-02-24 VITALS — BP 116/66 | HR 71 | Temp 97.7°F | Resp 16 | Ht 69.0 in | Wt 233.4 lb

## 2020-02-24 DIAGNOSIS — I10 Essential (primary) hypertension: Secondary | ICD-10-CM

## 2020-02-24 DIAGNOSIS — Z9114 Patient's other noncompliance with medication regimen: Secondary | ICD-10-CM | POA: Diagnosis not present

## 2020-02-24 DIAGNOSIS — E118 Type 2 diabetes mellitus with unspecified complications: Secondary | ICD-10-CM | POA: Diagnosis not present

## 2020-02-24 DIAGNOSIS — H9193 Unspecified hearing loss, bilateral: Secondary | ICD-10-CM

## 2020-02-24 DIAGNOSIS — K859 Acute pancreatitis without necrosis or infection, unspecified: Secondary | ICD-10-CM

## 2020-02-24 DIAGNOSIS — Z91148 Patient's other noncompliance with medication regimen for other reason: Secondary | ICD-10-CM

## 2020-02-24 DIAGNOSIS — Z09 Encounter for follow-up examination after completed treatment for conditions other than malignant neoplasm: Secondary | ICD-10-CM | POA: Diagnosis not present

## 2020-02-24 DIAGNOSIS — Z0000 Encounter for general adult medical examination without abnormal findings: Secondary | ICD-10-CM

## 2020-02-24 DIAGNOSIS — I2694 Multiple subsegmental pulmonary emboli without acute cor pulmonale: Secondary | ICD-10-CM

## 2020-02-24 DIAGNOSIS — Z794 Long term (current) use of insulin: Secondary | ICD-10-CM

## 2020-02-24 LAB — POCT CBG (FASTING - GLUCOSE)-MANUAL ENTRY: Glucose Fasting, POC: 107 mg/dL — AB (ref 70–99)

## 2020-02-24 NOTE — Progress Notes (Signed)
Paramedicine Encounter    Patient ID: Troy Rush, male    DOB: 1970/06/13, 49 y.o.   MRN: [id number]   Patient Care Team: [name], FNP as PCP - General (Family Medicine) Larey Dresser, MD as PCP - Cardiology (Cardiology) Conrad Jacksboro, NP as Nurse Practitioner (Cardiology) Larey Dresser, MD as Consulting Physician (Cardiology)  Patient Active Problem List   Diagnosis Date Noted  . Pulmonary embolism ([place]) 02/05/2020  . Acute kidney injury superimposed on CKD ([place])   . Chronic combined systolic and diastolic heart failure ([place])   . Atrial fibrillation with RVR ([place]) 02/01/2020  . NSVT (nonsustained ventricular tachycardia) ([place]) 02/01/2020  . Acute GI bleeding 02/01/2020  . DKA (diabetic ketoacidosis) ([place]) 02/01/2020  . Bilateral deafness 08/15/2019  . Type 2 diabetes mellitus with complication, with long-term current use of insulin ([place]) 08/15/2019  . Hemoglobin A1C between 7% and 9% indicating borderline diabetic control 08/15/2019  . Insomnia 08/15/2019  . Abdominal pain 11/14/2018  . Ischemic cardiomyopathy 10/14/2016  . NICM (nonischemic cardiomyopathy) ([place]) 09/15/2016  . Chronic systolic CHF (congestive heart failure) ([place]) 05/30/2016  . HTN (hypertension) 05/30/2016  . Deaf 05/30/2016  . Hyperkalemia 05/30/2016  . Diabetes ([place]) 03/21/2016  . Major depressive disorder, recurrent episode ([place]) 03/21/2016    Current Outpatient Medications:  .  amiodarone (PACERONE) 200 MG tablet, Take 1 tablet (200 mg total) by mouth daily., Disp: 30 tablet, Rfl: 6 .  apixaban (ELIQUIS) 5 MG TABS tablet, Take 5 mg by mouth 2 (two) times daily., Disp: , Rfl:  .  atorvastatin (LIPITOR) 80 MG tablet, Take 1 tablet (80 mg total) by mouth at bedtime., Disp: 30 tablet, Rfl: 11 .  carvedilol (COREG) 25 MG tablet, Take 1 tablet (25 mg total) by mouth 2 (two) times daily with a meal., Disp: 60 tablet, Rfl: 0 .  citalopram (CELEXA) 40 MG tablet, Take 40 mg by mouth daily as needed  (depression). , Disp: , Rfl:  .  fenofibrate 54 MG tablet, Take 1 tablet (54 mg total) by mouth daily., Disp: 30 tablet, Rfl: 2 .  furosemide (LASIX) 40 MG tablet, Take 1 tablet (40 mg total) by mouth 2 (two) times daily., Disp: 60 tablet, Rfl: 11 .  hydrALAZINE (APRESOLINE) 25 MG tablet, Take 1.5 tablets (37.5 mg total) by mouth every 8 (eight) hours., Disp: 90 tablet, Rfl: 0 .  icosapent Ethyl (VASCEPA) 1 g capsule, Take 2 capsules (2 g total) by mouth 2 (two) times daily., Disp: 120 capsule, Rfl: 5 .  insulin glargine (LANTUS) 100 UNIT/ML injection, Inject 0.24 mLs (24 Units total) into the skin daily., Disp: 10 mL, Rfl: 11 .  isosorbide mononitrate (IMDUR) 30 MG 24 hr tablet, Take 1 tablet (30 mg total) by mouth daily., Disp: 90 tablet, Rfl: 3 .  latanoprost (XALATAN) 0.005 % ophthalmic solution, Place 1 drop into both eyes at bedtime., Disp: , Rfl:  .  pantoprazole (PROTONIX) 40 MG tablet, Take 1 tablet (40 mg total) by mouth 2 (two) times daily., Disp: 60 tablet, Rfl: 0 .  sacubitril-valsartan (ENTRESTO) 24-26 MG, Take 1 tablet by mouth 2 (two) times daily., Disp: 60 tablet, Rfl: 11 .  Vitamin D, Ergocalciferol, (DRISDOL) 1.25 MG (50000 UNIT) CAPS capsule, Take 1 capsule (50,000 Units total) by mouth every 7 (seven) days. (Patient taking differently: Take 50,000 Units by mouth every Monday. ), Disp: 5 capsule, Rfl: 6 Allergies  Allergen Reactions  . Lisinopril Cough      Social History  Socioeconomic History  . Marital status: Legally Separated    Spouse name: Not on file  . Number of children: Not on file  . Years of education: Not on file  . Highest education level: Not on file  Occupational History  . Not on file  Tobacco Use  . Smoking status: Never Smoker  . Smokeless tobacco: Never Used  Vaping Use  . Vaping Use: Never used  Substance and Sexual Activity  . Alcohol use: Yes    Alcohol/week: 1.0 - 2.0 standard drink    Types: 1 - 2 Glasses of wine per week    Comment:  rare  . Drug use: No  . Sexual activity: Yes  Other Topics Concern  . Not on file  Social History Narrative  . Not on file   Social Determinants of Health   Financial Resource Strain: Medium Risk  . Difficulty of Paying Living Expenses: Somewhat hard  Food Insecurity: No Food Insecurity  . Worried About Charity fundraiser in the Last Year: Never true  . Ran Out of Food in the Last Year: Never true  Transportation Needs: No Transportation Needs  . Lack of Transportation (Medical): No  . Lack of Transportation (Non-Medical): No  Physical Activity:   . Days of Exercise per Week: Not on file  . Minutes of Exercise per Session: Not on file  Stress:   . Feeling of Stress : Not on file  Social Connections:   . Frequency of Communication with Friends and Family: Not on file  . Frequency of Social Gatherings with Friends and Family: Not on file  . Attends Religious Services: Not on file  . Active Member of Clubs or Organizations: Not on file  . Attends Archivist Meetings: Not on file  . Marital Status: Not on file  Intimate Partner Violence:   . Fear of Current or Ex-Partner: Not on file  . Emotionally Abused: Not on file  . Physically Abused: Not on file  . Sexually Abused: Not on file    Physical Exam      Future Appointments  Date Time Provider [place]  02/26/2020 11:00 AM PCC-PROVIDER P[facility]-PCC None  03/02/2020 10:15 AM [facility] LAB [facility] None  03/11/2020 11:00 AM [name], FNP [facility]-SCC None  03/19/2020 10:00 AM [facility] PHARMACY [facility] None  03/23/2020  8:35 AM [facility] DEVICE REMOTES [facility]  04/16/2020  9:30 AM [facility] PA/NP [facility] None  06/22/2020  8:35 AM [facility] DEVICE REMOTES [facility]  09/21/2020  8:35 AM [facility] DEVICE REMOTES [facility]  12/21/2020  8:35 AM [facility] DEVICE REMOTES [facility]  03/22/2021  8:35 AM [facility] DEVICE REMOTES [facility]   06/21/2021  8:35 AM [facility] DEVICE REMOTES [facility]  09/20/2021  8:35 AM [facility] DEVICE REMOTES [facility]  12/20/2021  8:35 AM [facility] DEVICE REMOTES [facility]    There were no vitals taken for this visit.  Weight yesterday-? Last visit weight-233  Pt reports not feeling well today. He states he has felt nausea and vomited this morning. It just started today.  Poor appetite today.  He went to PCP earlier today.  His b/p much better today.  He is doing better with med compliance.  He missed 2 evening doses this week and that's it.  meds verified and pill box refilled.  He had just gotten home from doctor office when I arrived.     [name], [facility] Paramedic  02/24/20

## 2020-02-24 NOTE — Progress Notes (Signed)
Patient [facility] Follow Up  Subjective:  Patient ID: Troy Rush, male    DOB: April 04, 1971  Age: 49 y.o. MRN: [id number]  CC:  Chief Complaint  Patient presents with  . Follow-up    Pt states he was released from the hospital sometime in November. Pt states he did vomit this morning.    HPI Troy Rush is a 49 year old male who presents for Hospital Follow Up today.    Patient Active Problem List   Diagnosis Date Noted  . Pulmonary embolism ([place]) 02/05/2020  . Acute kidney injury superimposed on CKD ([place])   . Chronic combined systolic and diastolic heart failure ([place])   . Atrial fibrillation with RVR ([place]) 02/01/2020  . NSVT (nonsustained ventricular tachycardia) ([place]) 02/01/2020  . Acute GI bleeding 02/01/2020  . DKA (diabetic ketoacidosis) ([place]) 02/01/2020  . Bilateral deafness 08/15/2019  . Type 2 diabetes mellitus with complication, with long-term current use of insulin ([place]) 08/15/2019  . Hemoglobin A1C between 7% and 9% indicating borderline diabetic control 08/15/2019  . Insomnia 08/15/2019  . Abdominal pain 11/14/2018  . Ischemic cardiomyopathy 10/14/2016  . NICM (nonischemic cardiomyopathy) ([place]) 09/15/2016  . Chronic systolic CHF (congestive heart failure) ([place]) 05/30/2016  . HTN (hypertension) 05/30/2016  . Deaf 05/30/2016  . Hyperkalemia 05/30/2016  . Diabetes ([place]) 03/21/2016  . Major depressive disorder, recurrent episode ([place]) 03/21/2016    Current Status: Since he last office visit, he has had a Hospital Admission for Acute Pancreatitis and Multiple PEs from 02/05/2020-02/08/2020 and from 01/31/2020-02/04/2020 for Diabetic Ketoacidosis and A-fib. Today, he is not feeling well today with nausea and chills.  Denies sudden onset of dyspnea and coughing. Denies Productive frothy, pink-tinged sputum. Denies tachycardia, pallor, feelings of impending doom. Denies symptoms of DVT, history of A-fib, any  recent surgeries, long-bone fractures, and prolonged inactivity (sedentary, long distant traveling). He denies fevers, chills, fatigue, recent infections, weight loss, and night sweats. He has not had any headaches, visual changes, dizziness, and falls. No chest pain, heart palpitations, cough and shortness of breath reported. Denies GI problems such as vomiting, diarrhea, and constipation. He has no reports of blood in stools, dysuria and hematuria. No depression or anxiety reported today. He is taking all medications as prescribed. He denies pain today.   Past Medical History:  Diagnosis Date  . AICD (automatic cardioverter/defibrillator) present 10/14/2016  . Bilateral deafness   . Bronchitis   . CHF (congestive heart failure) ([place])   . Depression   . Diabetes mellitus without complication ([place])   . DVT, lower extremity ([place]) 07/12/2019  . Dyslipidemia   . Headache 08/2019  . Hypertension   . Insomnia 08/2019  . NICM (nonischemic cardiomyopathy) ([place]) 09/15/2016  . Pancreatitis   . Vitamin D deficiency 08/2019    Past Surgical History:  Procedure Laterality Date  . BACK SURGERY    . CARDIAC CATHETERIZATION    . ESOPHAGOGASTRODUODENOSCOPY (EGD) WITH PROPOFOL N/A 02/02/2020   Procedure: ESOPHAGOGASTRODUODENOSCOPY (EGD) WITH PROPOFOL;  Surgeon: Arta Silence, MD;  Location: [facility];  Service: Endoscopy;  Laterality: N/A;  . ICD IMPLANT  10/14/2016  . ICD IMPLANT N/A 10/14/2016   Procedure: ICD Implant;  Surgeon: Deboraha Sprang, MD;  Location: [facility];  Service: Cardiovascular;  Laterality: N/A;  . WRIST SURGERY      Family History  Problem Relation Age of Onset  . Hypertension Mother   . Healthy Father   .  Stroke Father     Social History   Socioeconomic History  . Marital status: Legally Separated    Spouse name: Not on file  . Number of children: Not on file  . Years of education: Not on file  . Highest education level: Not on file  Occupational History   . Not on file  Tobacco Use  . Smoking status: Never Smoker  . Smokeless tobacco: Never Used  Vaping Use  . Vaping Use: Never used  Substance and Sexual Activity  . Alcohol use: Yes    Alcohol/week: 1.0 - 2.0 standard drink    Types: 1 - 2 Glasses of wine per week    Comment: rare  . Drug use: No  . Sexual activity: Yes  Other Topics Concern  . Not on file  Social History Narrative  . Not on file   Social Determinants of Health   Financial Resource Strain: Medium Risk  . Difficulty of Paying Living Expenses: Somewhat hard  Food Insecurity: No Food Insecurity  . Worried About Charity fundraiser in the Last Year: Never true  . Ran Out of Food in the Last Year: Never true  Transportation Needs: No Transportation Needs  . Lack of Transportation (Medical): No  . Lack of Transportation (Non-Medical): No  Physical Activity:   . Days of Exercise per Week: Not on file  . Minutes of Exercise per Session: Not on file  Stress:   . Feeling of Stress : Not on file  Social Connections:   . Frequency of Communication with Friends and Family: Not on file  . Frequency of Social Gatherings with Friends and Family: Not on file  . Attends Religious Services: Not on file  . Active Member of Clubs or Organizations: Not on file  . Attends Archivist Meetings: Not on file  . Marital Status: Not on file  Intimate Partner Violence:   . Fear of Current or Ex-Partner: Not on file  . Emotionally Abused: Not on file  . Physically Abused: Not on file  . Sexually Abused: Not on file    Outpatient Medications Prior to Visit  Medication Sig Dispense Refill  . apixaban (ELIQUIS) 5 MG TABS tablet Take 5 mg by mouth 2 (two) times daily.    Marland Kitchen atorvastatin (LIPITOR) 80 MG tablet Take 1 tablet (80 mg total) by mouth at bedtime. 30 tablet 11  . carvedilol (COREG) 25 MG tablet Take 1 tablet (25 mg total) by mouth 2 (two) times daily with a meal. 60 tablet 0  . citalopram (CELEXA) 40 MG tablet  Take 40 mg by mouth daily as needed (depression).     . fenofibrate 54 MG tablet Take 1 tablet (54 mg total) by mouth daily. 30 tablet 2  . hydrALAZINE (APRESOLINE) 25 MG tablet Take 1.5 tablets (37.5 mg total) by mouth every 8 (eight) hours. 90 tablet 0  . icosapent Ethyl (VASCEPA) 1 g capsule Take 2 capsules (2 g total) by mouth 2 (two) times daily. 120 capsule 5  . insulin glargine (LANTUS) 100 UNIT/ML injection Inject 0.24 mLs (24 Units total) into the skin daily. 10 mL 11  . isosorbide mononitrate (IMDUR) 30 MG 24 hr tablet Take 1 tablet (30 mg total) by mouth daily. 90 tablet 3  . latanoprost (XALATAN) 0.005 % ophthalmic solution Place 1 drop into both eyes at bedtime.    . pantoprazole (PROTONIX) 40 MG tablet Take 1 tablet (40 mg total) by mouth 2 (two) times daily. 60 tablet  0  . sacubitril-valsartan (ENTRESTO) 24-26 MG Take 1 tablet by mouth 2 (two) times daily. 60 tablet 11  . Vitamin D, Ergocalciferol, (DRISDOL) 1.25 MG (50000 UNIT) CAPS capsule Take 1 capsule (50,000 Units total) by mouth every 7 (seven) days. (Patient taking differently: Take 50,000 Units by mouth every Monday. ) 5 capsule 6  . furosemide (LASIX) 40 MG tablet Take 1 tablet (40 mg total) by mouth 2 (two) times daily. 60 tablet 11  . amiodarone (PACERONE) 200 MG tablet Take 1 tablet (200 mg total) by mouth daily. 30 tablet 6   No facility-administered medications prior to visit.    Allergies  Allergen Reactions  . Lisinopril Cough    ROS Review of Systems  Constitutional: Negative.   HENT: Negative.   Eyes: Negative.   Respiratory: Positive for cough (occasional ) and shortness of breath (occasional ).   Cardiovascular: Negative.   Gastrointestinal: Positive for nausea (occasional ) and vomiting (occasional ).  Endocrine: Negative.   Genitourinary: Negative.   Musculoskeletal: Negative.   Skin: Negative.   Allergic/Immunologic: Negative.   Neurological: Negative.   Hematological: Negative.     Psychiatric/Behavioral: Negative.       Objective:    Physical Exam Vitals and nursing note reviewed.  Constitutional:      Appearance: Normal appearance.  HENT:     Head: Normocephalic and atraumatic.     Nose: Nose normal.     Mouth/Throat:     Mouth: Mucous membranes are moist.     Pharynx: Oropharynx is clear.  Cardiovascular:     Rate and Rhythm: Normal rate and regular rhythm.     Pulses: Normal pulses.     Heart sounds: Normal heart sounds.  Pulmonary:     Effort: Pulmonary effort is normal.     Breath sounds: Normal breath sounds.  Abdominal:     General: Bowel sounds are normal.     Palpations: Abdomen is soft.  Musculoskeletal:        General: Normal range of motion.     Cervical back: Normal range of motion and neck supple.  Skin:    General: Skin is warm and dry.  Neurological:     General: No focal deficit present.     Mental Status: He is alert and oriented to person, place, and time.  Psychiatric:        Mood and Affect: Mood normal.        Behavior: Behavior normal.        Thought Content: Thought content normal.        Judgment: Judgment normal.     BP 116/66 (BP Location: Left Arm, Patient Position: Sitting, Cuff Size: Large)   Pulse 71   Temp 97.7 F (36.5 C)   Resp 16   Ht 5\' 9"  (1.753 m)   Wt 233 lb 6.4 oz (105.9 kg)   SpO2 100%   BMI 34.47 kg/m  Wt Readings from Last 3 Encounters:  02/24/20 230 lb (104.3 kg)  02/24/20 233 lb 6.4 oz (105.9 kg)  02/20/20 228 lb 12.8 oz (103.8 kg)     Health Maintenance Due  Topic Date Due  . Hepatitis C Screening  Never done  . COVID-19 Vaccine (1) Never done  . OPHTHALMOLOGY EXAM  05/30/2017  . FOOT EXAM  08/08/2018  . COLONOSCOPY  Never done  . INFLUENZA VACCINE  11/10/2019    There are no preventive care reminders to display for this patient.  Lab Results  Component Value Date  TSH 6.463 (H) 02/20/2020   Lab Results  Component Value Date   WBC 5.9 02/13/2020   HGB 9.6 (L)  02/13/2020   HCT 32.2 (L) 02/13/2020   MCV 98.5 02/13/2020   PLT 350 02/13/2020   Lab Results  Component Value Date   NA 135 02/20/2020   K 4.3 02/20/2020   CO2 26 02/20/2020   GLUCOSE 192 (H) 02/20/2020   BUN 30 (H) 02/20/2020   CREATININE 2.12 (H) 02/20/2020   BILITOT 0.9 02/13/2020   ALKPHOS 47 02/13/2020   AST 30 02/13/2020   ALT 22 02/13/2020   PROT 7.7 02/13/2020   ALBUMIN 3.6 02/13/2020   CALCIUM 9.5 02/20/2020   ANIONGAP 11 02/20/2020   Lab Results  Component Value Date   CHOL 274 (H) 02/06/2020   Lab Results  Component Value Date   HDL 66 02/06/2020   Lab Results  Component Value Date   LDLCALC 180 (H) 02/06/2020   Lab Results  Component Value Date   TRIG 138 02/06/2020   Lab Results  Component Value Date   CHOLHDL 4.2 02/06/2020   Lab Results  Component Value Date   HGBA1C 11.0 (H) 02/01/2020      Assessment & Plan:   1. Hospital discharge follow-up  2. Acute pancreatitis, unspecified complication status, unspecified pancreatitis type Stable today. No signs or symptoms of recurrence noted or reported.  3. Multiple subsegmental pulmonary emboli without acute cor pulmonale (HCC) Stable. No symptoms of recurrence noted or reported.   4. Non compliance w medication regimen He will begin to take all medications as prescribed.   5. Type 2 diabetes mellitus with complication, with long-term current use of insulin ([place]) He will continue medication as prescribed, to decrease foods/beverages high in sugars and carbs and follow Heart Healthy or DASH diet. Increase physical activity to at least 30 minutes cardio exercise daily.  - Glucose (CBG), Fasting  6. Bilateral deafness Deaf interpreter used today.   7. Essential hypertension The current medical regimen is effective; he blood pressure is stable at 116/66 today; continue present plan and medications as prescribed. He will continue to take medications as prescribed, to decrease high sodium  intake, excessive alcohol intake, increase potassium intake, smoking cessation, and increase physical activity of at least 30 minutes of cardio activity daily. He will continue to follow Heart Healthy or DASH diet.  8. Healthcare maintenance  9. Follow up He will follow up in 2 weeks.    Orders Placed This Encounter  Procedures  . Glucose (CBG), Fasting    No orders of the defined types were placed in this encounter.   Referral Orders  No referral(s) requested today    Kathe Becton,  MSN, FNP-BC [facility] [address] [place], [place] [address] [phone] fax   Problem List Items Addressed This Visit      Cardiovascular and Mediastinum   Pulmonary embolism [facility])     Endocrine   Type 2 diabetes mellitus with complication, with long-term current use of insulin (HCC)   Relevant Orders   Glucose (CBG), Fasting (Completed)     Nervous and Auditory   Bilateral deafness    Other Visit Diagnoses    Hospital discharge follow-up    -  Primary   Acute pancreatitis, unspecified complication status, unspecified pancreatitis type       Non compliance w medication regimen       Essential hypertension  Healthcare maintenance       Follow up          No orders of the defined types were placed in this encounter.   Follow-up: Return in about 2 weeks (around 03/09/2020).    [name], FNP

## 2020-02-25 ENCOUNTER — Telehealth (HOSPITAL_COMMUNITY): Payer: Self-pay

## 2020-02-25 MED ORDER — FUROSEMIDE 40 MG PO TABS
ORAL_TABLET | ORAL | 11 refills | Status: DC
Start: 2020-02-25 — End: 2020-03-11

## 2020-02-25 NOTE — Telephone Encounter (Signed)
-----   Message from Consuelo Pandy, [place] sent at 02/21/2020 12:12 PM EST ----- Slight bump in SCr. Do NOT start Farxiga. Will hold off on this for now. Reduce PM dose of Lasix, change to 40 mg qam and 20 mg qpm. Repeat BMP in 1 week.

## 2020-02-25 NOTE — Telephone Encounter (Signed)
Malena Edman, RN  02/25/2020 2:48 PM EST Back to Top    Paramedicine aware. Med list updated, lab appt scheduled.   Malena Edman, RN  02/25/2020 1:41 PM EST     Left message with paramedicine to return call

## 2020-02-26 ENCOUNTER — Ambulatory Visit: Payer: Medicare Other

## 2020-02-28 ENCOUNTER — Other Ambulatory Visit (HOSPITAL_COMMUNITY): Payer: Self-pay | Admitting: *Deleted

## 2020-02-28 DIAGNOSIS — I5042 Chronic combined systolic (congestive) and diastolic (congestive) heart failure: Secondary | ICD-10-CM

## 2020-03-01 ENCOUNTER — Encounter: Payer: Self-pay | Admitting: Family Medicine

## 2020-03-02 ENCOUNTER — Other Ambulatory Visit: Payer: Self-pay

## 2020-03-02 ENCOUNTER — Ambulatory Visit (HOSPITAL_COMMUNITY)
Admission: RE | Admit: 2020-03-02 | Discharge: 2020-03-02 | Disposition: A | Discharge status: Home / Self Care | Payer: Medicare Other | Source: Ambulatory Visit | Attending: Internal Medicine | Admitting: Internal Medicine

## 2020-03-02 DIAGNOSIS — I5042 Chronic combined systolic (congestive) and diastolic (congestive) heart failure: Secondary | ICD-10-CM | POA: Diagnosis not present

## 2020-03-02 LAB — BASIC METABOLIC PANEL
Anion gap: 14 (ref 5–15)
BUN: 56 mg/dL — ABNORMAL HIGH (ref 6–20)
CO2: 28 mmol/L (ref 22–32)
Calcium: 10.2 mg/dL (ref 8.9–10.3)
Chloride: 99 mmol/L (ref 98–111)
Creatinine, Ser: 2.68 mg/dL — ABNORMAL HIGH (ref 0.61–1.24)
GFR, Estimated: 28 mL/min — ABNORMAL LOW (ref >60)
Glucose, Bld: 109 mg/dL — ABNORMAL HIGH (ref 70–99)
Potassium: 4.5 mmol/L (ref 3.5–5.1)
Sodium: 141 mmol/L (ref 135–145)

## 2020-03-03 ENCOUNTER — Other Ambulatory Visit (HOSPITAL_COMMUNITY): Payer: Self-pay

## 2020-03-03 NOTE — Progress Notes (Signed)
Paramedicine Encounter    Patient ID: Troy Rush, male    DOB: 1970/04/27, 49 y.o.   MRN: [id number]   Patient Care Team: [name], FNP as PCP - General (Family Medicine) Larey Dresser, MD as PCP - Cardiology (Cardiology) [place], NP as Nurse Practitioner (Cardiology) Larey Dresser, MD as Consulting Physician (Cardiology)  Patient Active Problem List   Diagnosis Date Noted  . Pulmonary embolism ([place]) 02/05/2020  . Acute kidney injury superimposed on CKD ([place])   . Chronic combined systolic and diastolic heart failure ([place])   . Atrial fibrillation with RVR ([place]) 02/01/2020  . NSVT (nonsustained ventricular tachycardia) ([place]) 02/01/2020  . Acute GI bleeding 02/01/2020  . DKA (diabetic ketoacidosis) ([place]) 02/01/2020  . Bilateral deafness 08/15/2019  . Type 2 diabetes mellitus with complication, with long-term current use of insulin ([place]) 08/15/2019  . Hemoglobin A1C between 7% and 9% indicating borderline diabetic control 08/15/2019  . Insomnia 08/15/2019  . Abdominal pain 11/14/2018  . Ischemic cardiomyopathy 10/14/2016  . NICM (nonischemic cardiomyopathy) ([place]) 09/15/2016  . Chronic systolic CHF (congestive heart failure) ([place]) 05/30/2016  . HTN (hypertension) 05/30/2016  . Deaf 05/30/2016  . Hyperkalemia 05/30/2016  . Diabetes ([place]) 03/21/2016  . Major depressive disorder, recurrent episode ([place]) 03/21/2016    Current Outpatient Medications:  .  amiodarone (PACERONE) 200 MG tablet, Take 1 tablet (200 mg total) by mouth daily., Disp: 30 tablet, Rfl: 6 .  apixaban (ELIQUIS) 5 MG TABS tablet, Take 5 mg by mouth 2 (two) times daily., Disp: , Rfl:  .  atorvastatin (LIPITOR) 80 MG tablet, Take 1 tablet (80 mg total) by mouth at bedtime., Disp: 30 tablet, Rfl: 11 .  carvedilol (COREG) 25 MG tablet, Take 1 tablet (25 mg total) by mouth 2 (two) times daily with a meal., Disp: 60 tablet, Rfl: 0 .  citalopram (CELEXA) 40 MG tablet, Take 40 mg by mouth daily as needed  (depression). , Disp: , Rfl:  .  fenofibrate 54 MG tablet, Take 1 tablet (54 mg total) by mouth daily., Disp: 30 tablet, Rfl: 2 .  furosemide (LASIX) 40 MG tablet, Take 1 tablet (40 mg total) by mouth in the morning AND 0.5 tablets (20 mg total) every evening. (Patient taking differently: 11/22--per lab results decrease to 40mg  daily), Disp: 60 tablet, Rfl: 11 .  hydrALAZINE (APRESOLINE) 25 MG tablet, Take 1.5 tablets (37.5 mg total) by mouth every 8 (eight) hours., Disp: 90 tablet, Rfl: 0 .  icosapent Ethyl (VASCEPA) 1 g capsule, Take 2 capsules (2 g total) by mouth 2 (two) times daily., Disp: 120 capsule, Rfl: 5 .  insulin glargine (LANTUS) 100 UNIT/ML injection, Inject 0.24 mLs (24 Units total) into the skin daily., Disp: 10 mL, Rfl: 11 .  isosorbide mononitrate (IMDUR) 30 MG 24 hr tablet, Take 1 tablet (30 mg total) by mouth daily., Disp: 90 tablet, Rfl: 3 .  latanoprost (XALATAN) 0.005 % ophthalmic solution, Place 1 drop into both eyes at bedtime., Disp: , Rfl:  .  pantoprazole (PROTONIX) 40 MG tablet, Take 1 tablet (40 mg total) by mouth 2 (two) times daily., Disp: 60 tablet, Rfl: 0 .  Vitamin D, Ergocalciferol, (DRISDOL) 1.25 MG (50000 UNIT) CAPS capsule, Take 1 capsule (50,000 Units total) by mouth every 7 (seven) days. (Patient taking differently: Take 50,000 Units by mouth every Monday. ), Disp: 5 capsule, Rfl: 6 .  sacubitril-valsartan (ENTRESTO) 24-26 MG, Take 1 tablet by mouth 2 (two) times daily. (Patient not taking: Reported  on 03/03/2020), Disp: 60 tablet, Rfl: 11 Allergies  Allergen Reactions  . Lisinopril Cough      Social History   Socioeconomic History  . Marital status: Legally Separated    Spouse name: Not on file  . Number of children: Not on file  . Years of education: Not on file  . Highest education level: Not on file  Occupational History  . Not on file  Tobacco Use  . Smoking status: Never Smoker  . Smokeless tobacco: Never Used  Vaping Use  . Vaping Use:  Never used  Substance and Sexual Activity  . Alcohol use: Yes    Alcohol/week: 1.0 - 2.0 standard drink    Types: 1 - 2 Glasses of wine per week    Comment: rare  . Drug use: No  . Sexual activity: Yes  Other Topics Concern  . Not on file  Social History Narrative  . Not on file   Social Determinants of Health   Financial Resource Strain: Medium Risk  . Difficulty of Paying Living Expenses: Somewhat hard  Food Insecurity: No Food Insecurity  . Worried About Charity fundraiser in the Last Year: Never true  . Ran Out of Food in the Last Year: Never true  Transportation Needs: No Transportation Needs  . Lack of Transportation (Medical): No  . Lack of Transportation (Non-Medical): No  Physical Activity:   . Days of Exercise per Week: Not on file  . Minutes of Exercise per Session: Not on file  Stress:   . Feeling of Stress : Not on file  Social Connections:   . Frequency of Communication with Friends and Family: Not on file  . Frequency of Social Gatherings with Friends and Family: Not on file  . Attends Religious Services: Not on file  . Active Member of Clubs or Organizations: Not on file  . Attends Archivist Meetings: Not on file  . Marital Status: Not on file  Intimate Partner Violence:   . Fear of Current or Ex-Partner: Not on file  . Emotionally Abused: Not on file  . Physically Abused: Not on file  . Sexually Abused: Not on file    Physical Exam      Future Appointments  Date Time Provider [place]  03/11/2020 11:00 AM [name], FNP [facility] None  03/17/2020 10:45 AM [place], PA-C [facility]  03/19/2020 10:00 AM [facility] PHARMACY [facility] None  03/23/2020  8:35 AM [facility] DEVICE REMOTES [facility]  04/16/2020  9:30 AM [facility] PA/NP [facility] None  06/22/2020  8:35 AM [facility] DEVICE REMOTES [facility]  09/21/2020  8:35 AM [facility] DEVICE REMOTES [facility]   12/21/2020  8:35 AM [facility] DEVICE REMOTES [facility]  03/22/2021  8:35 AM [facility] DEVICE REMOTES [facility]  06/21/2021  8:35 AM [facility] DEVICE REMOTES [facility]  09/20/2021  8:35 AM [facility] DEVICE REMOTES [facility]  12/20/2021  8:35 AM [facility] DEVICE REMOTES [facility]    BP (!) 160/82   Pulse 78   Resp 18   Wt 228 lb (103.4 kg)   SpO2 98%   BMI 33.67 kg/m   Weight yesterday-? Last visit weight-233  Pt reports he ok. Sometimes get dizzy.  No missed doses of his meds. He has not taken his AM dose yet due to awaiting lab result changes-- Stop entresto.  Hold lasix for 1 day and then resume to 40mg  daily and repeat labs in a week.  meds verified and pill box  refilled with med changes noted.  He is interested to changing pharmacy that delivers to him. Will give his info to [place].  His b/p is elevated but no meds this morning. He took them once I made those changes during our visit.  He needs vascepa filled. He has enough to get through Saturday.    [name], [facility] Paramedic  03/03/20

## 2020-03-11 ENCOUNTER — Encounter: Payer: Self-pay | Admitting: Family Medicine

## 2020-03-11 ENCOUNTER — Other Ambulatory Visit (HOSPITAL_COMMUNITY): Payer: Self-pay

## 2020-03-11 ENCOUNTER — Other Ambulatory Visit: Payer: Self-pay

## 2020-03-11 ENCOUNTER — Ambulatory Visit (INDEPENDENT_AMBULATORY_CARE_PROVIDER_SITE_OTHER): Payer: Medicare Other | Admitting: Family Medicine

## 2020-03-11 ENCOUNTER — Telehealth (HOSPITAL_COMMUNITY): Payer: Self-pay | Admitting: Cardiology

## 2020-03-11 ENCOUNTER — Telehealth (HOSPITAL_COMMUNITY): Payer: Self-pay | Admitting: Licensed Clinical Social Worker

## 2020-03-11 VITALS — BP 189/98 | HR 73 | Temp 98.7°F | Resp 17 | Ht 69.0 in | Wt 237.2 lb

## 2020-03-11 DIAGNOSIS — Z23 Encounter for immunization: Secondary | ICD-10-CM | POA: Diagnosis not present

## 2020-03-11 DIAGNOSIS — E118 Type 2 diabetes mellitus with unspecified complications: Secondary | ICD-10-CM | POA: Diagnosis not present

## 2020-03-11 DIAGNOSIS — R7303 Prediabetes: Secondary | ICD-10-CM

## 2020-03-11 DIAGNOSIS — H9193 Unspecified hearing loss, bilateral: Secondary | ICD-10-CM | POA: Diagnosis not present

## 2020-03-11 DIAGNOSIS — I10 Essential (primary) hypertension: Secondary | ICD-10-CM

## 2020-03-11 DIAGNOSIS — I160 Hypertensive urgency: Secondary | ICD-10-CM

## 2020-03-11 DIAGNOSIS — Z09 Encounter for follow-up examination after completed treatment for conditions other than malignant neoplasm: Secondary | ICD-10-CM

## 2020-03-11 DIAGNOSIS — Z0000 Encounter for general adult medical examination without abnormal findings: Secondary | ICD-10-CM

## 2020-03-11 DIAGNOSIS — Z794 Long term (current) use of insulin: Secondary | ICD-10-CM

## 2020-03-11 DIAGNOSIS — E119 Type 2 diabetes mellitus without complications: Secondary | ICD-10-CM

## 2020-03-11 LAB — POCT GLYCOSYLATED HEMOGLOBIN (HGB A1C)
HbA1c POC (<> result, manual entry): 7.8 % (ref 4.0–5.6)
HbA1c, POC (controlled diabetic range): 7.8 % — AB (ref 0.0–7.0)
HbA1c, POC (prediabetic range): 7.8 % — AB (ref 5.7–6.4)
Hemoglobin A1C: 7.9 % — AB (ref 4.0–5.6)

## 2020-03-11 MED ORDER — LANCETS MISC: 1.0000 | Freq: Three times a day (TID) | 3 refills | Status: DC | PRN

## 2020-03-11 MED ORDER — FUROSEMIDE 40 MG PO TABS
40.0000 mg | ORAL_TABLET | Freq: Every morning | ORAL | 11 refills | Status: DC
Start: 2020-03-11 — End: 2020-03-18

## 2020-03-11 NOTE — Progress Notes (Signed)
Patient [name]   Established Patient Office Visit  Subjective:  Patient ID: Troy Rush, male    DOB: 12/15/1970  Age: 49 y.o. MRN: [id number]  CC:  Chief Complaint  Patient presents with  . Follow-up    HPI Troy Rush is a 49 year old male who presents for Follow Up today.  Patient Active Problem List   Diagnosis Date Noted  . Pulmonary embolism ([place]) 02/05/2020  . Acute kidney injury superimposed on CKD ([place])   . Chronic combined systolic and diastolic heart failure ([place])   . Atrial fibrillation with RVR ([place]) 02/01/2020  . NSVT (nonsustained ventricular tachycardia) ([place]) 02/01/2020  . Acute GI bleeding 02/01/2020  . DKA (diabetic ketoacidosis) ([place]) 02/01/2020  . Bilateral deafness 08/15/2019  . Type 2 diabetes mellitus with complication, with long-term current use of insulin ([place]) 08/15/2019  . Hemoglobin A1C between 7% and 9% indicating borderline diabetic control 08/15/2019  . Insomnia 08/15/2019  . Abdominal pain 11/14/2018  . Ischemic cardiomyopathy 10/14/2016  . NICM (nonischemic cardiomyopathy) ([place]) 09/15/2016  . Chronic systolic CHF (congestive heart failure) ([place]) 05/30/2016  . HTN (hypertension) 05/30/2016  . Deaf 05/30/2016  . Hyperkalemia 05/30/2016  . Diabetes ([place]) 03/21/2016  . Major depressive disorder, recurrent episode ([place]) 03/21/2016   Current Status: Since his last office visit, he is doing well with no complaints. Blood pressure readings are elevated today. Patient states that he has not taken his hypertensive medications as of yet. He denies fatigue, frequent urination, blurred vision, excessive hunger, excessive thirst, weight gain, weight loss, and poor wound healing. He continues to check his feet regularly. Deaf interpreter here today. He denies fevers, chills, fatigue, recent infections, weight loss, and night sweats. He has not had any headaches, visual changes, dizziness, and falls. No chest  pain, heart palpitations, cough and shortness of breath reported. Denies GI problems such as nausea, vomiting, diarrhea, and constipation. He has no reports of blood in stools, dysuria and hematuria. No depression or anxiety reported today. He is taking all medications as prescribed. He denies pain today. He is accompanied by a Psychologist, counselling today.   Past Medical History:  Diagnosis Date  . AICD (automatic cardioverter/defibrillator) present 10/14/2016  . Bilateral deafness   . Bronchitis   . CHF (congestive heart failure) ([place])   . Depression   . Diabetes mellitus without complication ([place])   . DVT, lower extremity ([place]) 07/12/2019  . Dyslipidemia   . Headache 08/2019  . Hypertension   . Insomnia 08/2019  . NICM (nonischemic cardiomyopathy) ([place]) 09/15/2016  . Pancreatitis   . Vitamin D deficiency 08/2019    Past Surgical History:  Procedure Laterality Date  . BACK SURGERY    . CARDIAC CATHETERIZATION    . ESOPHAGOGASTRODUODENOSCOPY (EGD) WITH PROPOFOL N/A 02/02/2020   Procedure: ESOPHAGOGASTRODUODENOSCOPY (EGD) WITH PROPOFOL;  Surgeon: Arta Silence, MD;  Location: [facility];  Service: Endoscopy;  Laterality: N/A;  . ICD IMPLANT  10/14/2016  . ICD IMPLANT N/A 10/14/2016   Procedure: ICD Implant;  Surgeon: Deboraha Sprang, MD;  Location: [facility];  Service: Cardiovascular;  Laterality: N/A;  . WRIST SURGERY      Family History  Problem Relation Age of Onset  . Hypertension Mother   . Healthy Father   . Stroke Father     Social History   Socioeconomic History  . Marital status: Legally Separated    Spouse name: Not on file  . Number  of children: Not on file  . Years of education: Not on file  . Highest education level: Not on file  Occupational History  . Not on file  Tobacco Use  . Smoking status: Never Smoker  . Smokeless tobacco: Never Used  Vaping Use  . Vaping Use: Never used  Substance and Sexual Activity  . Alcohol use: Yes    Alcohol/week:  1.0 - 2.0 standard drink    Types: 1 - 2 Glasses of wine per week    Comment: rare  . Drug use: No  . Sexual activity: Yes  Other Topics Concern  . Not on file  Social History Narrative  . Not on file   Social Determinants of Health   Financial Resource Strain: Medium Risk  . Difficulty of Paying Living Expenses: Somewhat hard  Food Insecurity: No Food Insecurity  . Worried About Charity fundraiser in the Last Year: Never true  . Ran Out of Food in the Last Year: Never true  Transportation Needs: No Transportation Needs  . Lack of Transportation (Medical): No  . Lack of Transportation (Non-Medical): No  Physical Activity:   . Days of Exercise per Week: Not on file  . Minutes of Exercise per Session: Not on file  Stress:   . Feeling of Stress : Not on file  Social Connections:   . Frequency of Communication with Friends and Family: Not on file  . Frequency of Social Gatherings with Friends and Family: Not on file  . Attends Religious Services: Not on file  . Active Member of Clubs or Organizations: Not on file  . Attends Archivist Meetings: Not on file  . Marital Status: Not on file  Intimate Partner Violence:   . Fear of Current or Ex-Partner: Not on file  . Emotionally Abused: Not on file  . Physically Abused: Not on file  . Sexually Abused: Not on file    Outpatient Medications Prior to Visit  Medication Sig Dispense Refill  . amiodarone (PACERONE) 200 MG tablet Take 1 tablet (200 mg total) by mouth daily. 30 tablet 6  . apixaban (ELIQUIS) 5 MG TABS tablet Take 5 mg by mouth 2 (two) times daily.    Marland Kitchen atorvastatin (LIPITOR) 80 MG tablet Take 1 tablet (80 mg total) by mouth at bedtime. 30 tablet 11  . carvedilol (COREG) 25 MG tablet Take 1 tablet (25 mg total) by mouth 2 (two) times daily with a meal. 60 tablet 0  . citalopram (CELEXA) 40 MG tablet Take 40 mg by mouth daily as needed (depression).     . fenofibrate 54 MG tablet Take 1 tablet (54 mg total) by  mouth daily. 30 tablet 2  . furosemide (LASIX) 40 MG tablet Take 1 tablet (40 mg total) by mouth in the morning. 60 tablet 11  . hydrALAZINE (APRESOLINE) 25 MG tablet Take 1.5 tablets (37.5 mg total) by mouth every 8 (eight) hours. 90 tablet 0  . icosapent Ethyl (VASCEPA) 1 g capsule Take 2 capsules (2 g total) by mouth 2 (two) times daily. 120 capsule 5  . insulin glargine (LANTUS) 100 UNIT/ML injection Inject 0.24 mLs (24 Units total) into the skin daily. 10 mL 11  . isosorbide mononitrate (IMDUR) 30 MG 24 hr tablet Take 1 tablet (30 mg total) by mouth daily. 90 tablet 3  . latanoprost (XALATAN) 0.005 % ophthalmic solution Place 1 drop into both eyes at bedtime.    . pantoprazole (PROTONIX) 40 MG tablet Take 1 tablet (  40 mg total) by mouth 2 (two) times daily. 60 tablet 0  . Vitamin D, Ergocalciferol, (DRISDOL) 1.25 MG (50000 UNIT) CAPS capsule Take 1 capsule (50,000 Units total) by mouth every 7 (seven) days. (Patient taking differently: Take 50,000 Units by mouth every Monday. ) 5 capsule 6  . furosemide (LASIX) 40 MG tablet Take 1 tablet (40 mg total) by mouth in the morning AND 0.5 tablets (20 mg total) every evening. (Patient taking differently: 11/22--per lab results decrease to 40mg  daily) 60 tablet 11  . sacubitril-valsartan (ENTRESTO) 24-26 MG Take 1 tablet by mouth 2 (two) times daily. (Patient not taking: Reported on 03/11/2020) 60 tablet 11   No facility-administered medications prior to visit.    Allergies  Allergen Reactions  . Lisinopril Cough    ROS Review of Systems  Constitutional: Negative.   HENT: Negative.        Deaf  Eyes: Negative.   Respiratory: Negative.   Cardiovascular: Negative.   Gastrointestinal: Negative.   Endocrine: Negative.   Genitourinary: Negative.   Musculoskeletal: Negative.   Skin: Negative.   Allergic/Immunologic: Negative.   Neurological: Positive for dizziness (occasional ) and headaches (occasional ).  Hematological: Negative.     Psychiatric/Behavioral: Negative.       Objective:    Physical Exam Vitals and nursing note reviewed. Exam conducted with a chaperone present Market researcher).  Constitutional:      Appearance: Normal appearance.  HENT:     Head: Normocephalic and atraumatic.     Mouth/Throat:     Mouth: Mucous membranes are moist.     Pharynx: Oropharynx is clear.  Cardiovascular:     Rate and Rhythm: Normal rate and regular rhythm.     Pulses: Normal pulses.     Heart sounds: Normal heart sounds.  Pulmonary:     Effort: Pulmonary effort is normal.     Breath sounds: Normal breath sounds.  Abdominal:     General: Bowel sounds are normal.     Palpations: Abdomen is soft.  Musculoskeletal:        General: Normal range of motion.     Cervical back: Normal range of motion and neck supple.  Skin:    General: Skin is warm and dry.  Neurological:     General: No focal deficit present.     Mental Status: He is alert and oriented to person, place, and time.  Psychiatric:        Mood and Affect: Mood normal.        Behavior: Behavior normal.        Thought Content: Thought content normal.        Judgment: Judgment normal.    BP (!) 189/98 (BP Location: Right Arm, Patient Position: Sitting, Cuff Size: Large)   Pulse 73   Temp 98.7 F (37.1 C)   Resp 17   Ht 5\' 9"  (1.753 m)   Wt 237 lb 3.2 oz (107.6 kg)   SpO2 100%   BMI 35.03 kg/m  Wt Readings from Last 3 Encounters:  03/11/20 238 lb (108 kg)  03/11/20 237 lb 3.2 oz (107.6 kg)  03/03/20 228 lb (103.4 kg)     Health Maintenance Due  Topic Date Due  . Hepatitis C Screening  Never done  . COVID-19 Vaccine (1) Never done  . OPHTHALMOLOGY EXAM  05/30/2017  . FOOT EXAM  08/08/2018  . COLONOSCOPY  Never done    There are no preventive care reminders to display for this patient.  Lab  Results  Component Value Date   TSH 6.463 (H) 02/20/2020   Lab Results  Component Value Date   WBC 5.9 02/13/2020   HGB 9.6 (L) 02/13/2020    HCT 32.2 (L) 02/13/2020   MCV 98.5 02/13/2020   PLT 350 02/13/2020   Lab Results  Component Value Date   NA 141 03/02/2020   K 4.5 03/02/2020   CO2 28 03/02/2020   GLUCOSE 109 (H) 03/02/2020   BUN 56 (H) 03/02/2020   CREATININE 2.68 (H) 03/02/2020   BILITOT 0.9 02/13/2020   ALKPHOS 47 02/13/2020   AST 30 02/13/2020   ALT 22 02/13/2020   PROT 7.7 02/13/2020   ALBUMIN 3.6 02/13/2020   CALCIUM 10.2 03/02/2020   ANIONGAP 14 03/02/2020   Lab Results  Component Value Date   CHOL 274 (H) 02/06/2020   Lab Results  Component Value Date   HDL 66 02/06/2020   Lab Results  Component Value Date   LDLCALC 180 (H) 02/06/2020   Lab Results  Component Value Date   TRIG 138 02/06/2020   Lab Results  Component Value Date   CHOLHDL 4.2 02/06/2020   Lab Results  Component Value Date   HGBA1C 7.9 (A) 03/11/2020   HGBA1C 7.8 03/11/2020   HGBA1C 7.8 (A) 03/11/2020   HGBA1C 7.8 (A) 03/11/2020      Assessment & Plan:   1. Hypertensive urgency Blood pressures are elevated today. He will begin taking all hypertensive medications a prescribe. He denies severe headaches, confusion, seizures, double vision, and blurred vision, nausea and vomiting. He will report to ED if he experiences these symptoms. Patient verbalized understanding.    2. Essential hypertension He will continue to take medications as prescribed, to decrease high sodium intake, excessive alcohol intake, increase potassium intake, smoking cessation, and increase physical activity of at least 30 minutes of cardio activity daily. He will continue to follow Heart Healthy or DASH diet.  3. Type 2 diabetes mellitus with complication, with long-term current use of insulin ([place]) He will continue medication as prescribed, to decrease foods/beverages high in sugars and carbs and follow Heart Healthy or DASH diet. Increase physical activity to at least 30 minutes cardio exercise daily.  - POC HgB A1c - Lancets MISC; 1 each by  Does not apply route 3 (three) times daily as needed.  Dispense: 300 each; Refill: 3  4. Need for prophylactic vaccination and inoculation against influenza  5. Hemoglobin A1C between 7% and 9% indicating borderline diabetic control Improved. Hgb A1c decreased at 7.9 today, from 14.5 a year ago. Monitor.  - Lancets MISC; 1 each by Does not apply route 3 (three) times daily as needed.  Dispense: 300 each; Refill: 3  6. Bilateral deafness  7. Healthcare maintenance  8. Need for influenza vaccination - Flu Vaccine QUAD 6+ mos PF IM (Fluarix Quad PF)  9. Follow up He will follow up in 3 months.   Meds ordered this encounter  Medications  . Lancets MISC    Sig: 1 each by Does not apply route 3 (three) times daily as needed.    Dispense:  300 each    Refill:  3    **Patient Preference**    Orders Placed This Encounter  Procedures  . Flu Vaccine QUAD 6+ mos PF IM (Fluarix Quad PF)  . POC HgB A1c    Referral Orders  No referral(s) requested today    Kathe Becton, MSN, ANE, FNP-BC [facility] [place], [place] [address] [phone] fax   Problem List Items Addressed This Visit      Endocrine   Hemoglobin A1C between 7% and 9% indicating borderline diabetic control   Relevant Medications   Lancets MISC   Type 2 diabetes mellitus with complication, with long-term current use of insulin ([facility])   Relevant Medications   Lancets MISC   Other Relevant Orders   POC HgB A1c (Completed)     Nervous and Auditory   Bilateral deafness    Other Visit Diagnoses    Hypertensive urgency    -  Primary   Essential hypertension       Need for prophylactic vaccination and inoculation against influenza       Healthcare maintenance       Need for influenza vaccination       Relevant Orders   Flu Vaccine QUAD 6+ mos PF IM (Fluarix Quad PF) (Completed)   Follow up            Meds ordered this encounter  Medications  . Lancets MISC    Sig: 1 each by Does not apply route 3 (three) times daily as needed.    Dispense:  300 each    Refill:  3    **Patient Preference**    Follow-up: Return in about 3 months (around 06/09/2020).    [name], FNP

## 2020-03-11 NOTE — Telephone Encounter (Signed)
-----   Message from Consuelo Pandy, [place] sent at 03/02/2020  6:14 PM EST ----- SCr worse. Labs suggest dehydration. Stop Entresto. Hold Lasix x 1 day, then resume at lower dose, 40 mg once daily. Repeat BMP in 1 week.

## 2020-03-11 NOTE — Progress Notes (Signed)
Paramedicine Encounter    Patient ID: Troy Rush, male    DOB: Jun 07, 1970, 49 y.o.   MRN: [id number]   Patient Care Team: [name], FNP as PCP - General (Family Medicine) Larey Dresser, MD as PCP - Cardiology (Cardiology) Conrad Ironton, NP as Nurse Practitioner (Cardiology) Larey Dresser, MD as Consulting Physician (Cardiology)  Patient Active Problem List   Diagnosis Date Noted  . Pulmonary embolism ([place]) 02/05/2020  . Acute kidney injury superimposed on CKD ([place])   . Chronic combined systolic and diastolic heart failure ([place])   . Atrial fibrillation with RVR ([place]) 02/01/2020  . NSVT (nonsustained ventricular tachycardia) ([place]) 02/01/2020  . Acute GI bleeding 02/01/2020  . DKA (diabetic ketoacidosis) ([place]) 02/01/2020  . Bilateral deafness 08/15/2019  . Type 2 diabetes mellitus with complication, with long-term current use of insulin ([place]) 08/15/2019  . Hemoglobin A1C between 7% and 9% indicating borderline diabetic control 08/15/2019  . Insomnia 08/15/2019  . Abdominal pain 11/14/2018  . Ischemic cardiomyopathy 10/14/2016  . NICM (nonischemic cardiomyopathy) ([place]) 09/15/2016  . Chronic systolic CHF (congestive heart failure) ([place]) 05/30/2016  . HTN (hypertension) 05/30/2016  . Deaf 05/30/2016  . Hyperkalemia 05/30/2016  . Diabetes ([place]) 03/21/2016  . Major depressive disorder, recurrent episode ([place]) 03/21/2016    Current Outpatient Medications:  .  amiodarone (PACERONE) 200 MG tablet, Take 1 tablet (200 mg total) by mouth daily., Disp: 30 tablet, Rfl: 6 .  apixaban (ELIQUIS) 5 MG TABS tablet, Take 5 mg by mouth 2 (two) times daily., Disp: , Rfl:  .  atorvastatin (LIPITOR) 80 MG tablet, Take 1 tablet (80 mg total) by mouth at bedtime., Disp: 30 tablet, Rfl: 11 .  carvedilol (COREG) 25 MG tablet, Take 1 tablet (25 mg total) by mouth 2 (two) times daily with a meal., Disp: 60 tablet, Rfl: 0 .  furosemide (LASIX) 40 MG tablet, Take 1 tablet (40 mg total) by mouth  in the morning AND 0.5 tablets (20 mg total) every evening. (Patient taking differently: 11/22--per lab results decrease to 40mg  daily), Disp: 60 tablet, Rfl: 11 .  hydrALAZINE (APRESOLINE) 25 MG tablet, Take 1.5 tablets (37.5 mg total) by mouth every 8 (eight) hours., Disp: 90 tablet, Rfl: 0 .  icosapent Ethyl (VASCEPA) 1 g capsule, Take 2 capsules (2 g total) by mouth 2 (two) times daily., Disp: 120 capsule, Rfl: 5 .  insulin glargine (LANTUS) 100 UNIT/ML injection, Inject 0.24 mLs (24 Units total) into the skin daily., Disp: 10 mL, Rfl: 11 .  isosorbide mononitrate (IMDUR) 30 MG 24 hr tablet, Take 1 tablet (30 mg total) by mouth daily., Disp: 90 tablet, Rfl: 3 .  Lancets MISC, 1 each by Does not apply route 3 (three) times daily as needed., Disp: 300 each, Rfl: 3 .  latanoprost (XALATAN) 0.005 % ophthalmic solution, Place 1 drop into both eyes at bedtime., Disp: , Rfl:  .  pantoprazole (PROTONIX) 40 MG tablet, Take 1 tablet (40 mg total) by mouth 2 (two) times daily., Disp: 60 tablet, Rfl: 0 .  Vitamin D, Ergocalciferol, (DRISDOL) 1.25 MG (50000 UNIT) CAPS capsule, Take 1 capsule (50,000 Units total) by mouth every 7 (seven) days. (Patient taking differently: Take 50,000 Units by mouth every Monday. ), Disp: 5 capsule, Rfl: 6 .  citalopram (CELEXA) 40 MG tablet, Take 40 mg by mouth daily as needed (depression).  (Patient not taking: Reported on 03/11/2020), Disp: , Rfl:  .  fenofibrate 54 MG tablet, Take 1 tablet (54 mg  total) by mouth daily., Disp: 30 tablet, Rfl: 2 .  sacubitril-valsartan (ENTRESTO) 24-26 MG, Take 1 tablet by mouth 2 (two) times daily. (Patient not taking: Reported on 03/11/2020), Disp: 60 tablet, Rfl: 11 Allergies  Allergen Reactions  . Lisinopril Cough      Social History   Socioeconomic History  . Marital status: Legally Separated    Spouse name: Not on file  . Number of children: Not on file  . Years of education: Not on file  . Highest education level: Not on file   Occupational History  . Not on file  Tobacco Use  . Smoking status: Never Smoker  . Smokeless tobacco: Never Used  Vaping Use  . Vaping Use: Never used  Substance and Sexual Activity  . Alcohol use: Yes    Alcohol/week: 1.0 - 2.0 standard drink    Types: 1 - 2 Glasses of wine per week    Comment: rare  . Drug use: No  . Sexual activity: Yes  Other Topics Concern  . Not on file  Social History Narrative  . Not on file   Social Determinants of Health   Financial Resource Strain: Medium Risk  . Difficulty of Paying Living Expenses: Somewhat hard  Food Insecurity: No Food Insecurity  . Worried About Charity fundraiser in the Last Year: Never true  . Ran Out of Food in the Last Year: Never true  Transportation Needs: No Transportation Needs  . Lack of Transportation (Medical): No  . Lack of Transportation (Non-Medical): No  Physical Activity:   . Days of Exercise per Week: Not on file  . Minutes of Exercise per Session: Not on file  Stress:   . Feeling of Stress : Not on file  Social Connections:   . Frequency of Communication with Friends and Family: Not on file  . Frequency of Social Gatherings with Friends and Family: Not on file  . Attends Religious Services: Not on file  . Active Member of Clubs or Organizations: Not on file  . Attends Archivist Meetings: Not on file  . Marital Status: Not on file  Intimate Partner Violence:   . Fear of Current or Ex-Partner: Not on file  . Emotionally Abused: Not on file  . Physically Abused: Not on file  . Sexually Abused: Not on file    Physical Exam      Future Appointments  Date Time Provider [place]  03/17/2020 10:45 AM Baldwin [place], PA-C [facility]  03/19/2020 10:00 AM [facility] PHARMACY [facility] None  03/23/2020  8:35 AM [facility] DEVICE REMOTES [facility]  04/16/2020  9:30 AM [facility] PA/NP [facility] None  06/09/2020 10:20 AM [name], FNP [facility] None   06/22/2020  8:35 AM [facility] DEVICE REMOTES [facility]  09/21/2020  8:35 AM [facility] DEVICE REMOTES [facility]  12/21/2020  8:35 AM [facility] DEVICE REMOTES [facility]  03/22/2021  8:35 AM [facility] DEVICE REMOTES [facility]  06/21/2021  8:35 AM [facility] DEVICE REMOTES [facility]  09/20/2021  8:35 AM [facility] DEVICE REMOTES [facility]  12/20/2021  8:35 AM [facility] DEVICE REMOTES [facility]    BP (!) 178/108   Pulse 78   Resp 18   Wt 238 lb (108 kg)   SpO2 99%   BMI 35.15 kg/m   Weight yesterday-? Last visit weight-228  Pt reports he is doing well. He denies sob, was seen at PCP today. A1C improved. No med changes with her today. His b/p was elevated  today at PCP.  No missed doses of his meds.  Once these meds need refills we will move to [place]. He did get vascepa delivered to him, he liked that.  Weight is up from last time--he is wearing jeans/sweatshirt and in later in the day.  He has f/u labs sch for tomor.   meds verified and pill box refilled.  He denies sob, no dizziness, no h/a, no edema noted, he does not have distention to abd, doesn't feel like he is getting fluid on him.  His b/p is high but he is due for his mid dose of meds.   --needs carvedilol-make sure they are 25mg  tabs --needs pantoprazole   [name], [place] Paramedic  03/11/20

## 2020-03-11 NOTE — Telephone Encounter (Signed)
CSW received email from patient requesting help with electric bill which is due Dec 10th- no cut off notice at this time.  Pt has applied for help through DSS but was told it could take 45 days to process.  CSW discussed pt applying for [employer] program which just opened- pt agreeable and sent application to patient- he will complete and get back to CSW as soon as possible.  Will continue to follow and assist as needed  Jorge Ny, [facility] Desk#: [phone] Cell#: [phone]

## 2020-03-11 NOTE — Telephone Encounter (Signed)
Pt aware repeat labs 12/9

## 2020-03-12 ENCOUNTER — Other Ambulatory Visit (HOSPITAL_COMMUNITY): Payer: Medicare Other

## 2020-03-13 ENCOUNTER — Encounter: Payer: Self-pay | Admitting: Family Medicine

## 2020-03-15 NOTE — Progress Notes (Signed)
Cardiology Office Note Date:  03/17/2020  Patient ID:  Troy Rush, Troy Rush [birth year], MRN [id number] PCP:  [name], FNP  Cardiologist/AHF: Dr Aundra Dubin Electrophysiologist: Dr. Caryl Comes    Chief Complaint: over due device visit  History of Present Illness: Troy Rush is a 49 y.o. male with history of deafness since age 4, HTN, DM, HLD, NICM, chronic CHF (systolic), ICD, DVT, PE, AFib, depression, CKD (III), ETOH abuse.  She comes in today to be seen for Dr Caryl Comes, last seen by him 2018.  8/21 and was drinking heavily due to depression, later restarted meds.  In 10/21, he was admitted with DKA and atrial fibrillation/RVR (1st known episode).  He converted to NSR spontaneously.  Troy Rush was stopped due to AKI.  He had melena/hematochezia and EGD showed gastritis/esophagitis.  Apixaban was stopped.  He went home but then was re-admitted later in 10/21 with bilateral PEs  He was seen by Dr. Aundra Dubin on 11/4 and was found to be fluid overloaded. Wt was up 4 lb and he was symptomatic w/ exertional dyspnea and orthopnea. Entresto was restarted at 24-26 mg bid and Lasix increased to 40 mg bid.  He had HF clinic f/u 02/20/20 for f/u. He reported some improvement in symptoms, able to walk on flat ground w/o dyspnea but still struggles up inclines. LEE has resolved. Wt is down 7 lb and His ReDs clip reading is WNL today at 32%. BP well controlled at 114/78. He is tolerating Entresto ok. He has occasional mild positional dizziness when he stands but quick resolves. No syncope/ near syncope Discussed noncompliance as a problem, was being followed by paramedicine   TODAY Today's visit was facilitated by [place] signe language interpretor, Troy Rush  He continues to do well, reports his weight stable at home No CP, palpitations, denies SOB, minimal DOE No dizzy spells, near syncope or syncope. Paramedicine has been very good for him and appreciates their help a lot.  Device information BSCi single  chamber ICD implanted 10/14/2016 AAD hx Amiodarone started Oct 2021 for AFib  Past Medical History:  Diagnosis Date  . AICD (automatic cardioverter/defibrillator) present 10/14/2016  . Bilateral deafness   . Bronchitis   . CHF (congestive heart failure) ([place])   . Depression   . Diabetes mellitus without complication ([place])   . DVT, lower extremity ([place]) 07/12/2019  . Dyslipidemia   . Headache 08/2019  . Hypertension   . Insomnia 08/2019  . NICM (nonischemic cardiomyopathy) ([place]) 09/15/2016  . Pancreatitis   . Vitamin D deficiency 08/2019    Past Surgical History:  Procedure Laterality Date  . BACK SURGERY    . CARDIAC CATHETERIZATION    . ESOPHAGOGASTRODUODENOSCOPY (EGD) WITH PROPOFOL N/A 02/02/2020   Procedure: ESOPHAGOGASTRODUODENOSCOPY (EGD) WITH PROPOFOL;  Surgeon: Arta Silence, MD;  Location: [facility];  Service: Endoscopy;  Laterality: N/A;  . ICD IMPLANT  10/14/2016  . ICD IMPLANT N/A 10/14/2016   Procedure: ICD Implant;  Surgeon: Deboraha Sprang, MD;  Location: [facility];  Service: Cardiovascular;  Laterality: N/A;  . WRIST SURGERY      Current Outpatient Medications  Medication Sig Dispense Refill  . amiodarone (PACERONE) 200 MG tablet Take 1 tablet (200 mg total) by mouth daily. 30 tablet 6  . apixaban (ELIQUIS) 5 MG TABS tablet Take 5 mg by mouth 2 (two) times daily.    Marland Kitchen atorvastatin (LIPITOR) 80 MG tablet Take 1 tablet (80 mg total) by mouth at bedtime. 30 tablet 11  .  carvedilol (COREG) 25 MG tablet Take 1 tablet (25 mg total) by mouth 2 (two) times daily with a meal. 60 tablet 0  . citalopram (CELEXA) 40 MG tablet Take 40 mg by mouth daily as needed (depression).     . fenofibrate 54 MG tablet Take 1 tablet (54 mg total) by mouth daily. 30 tablet 2  . furosemide (LASIX) 40 MG tablet Take 1 tablet (40 mg total) by mouth in the morning. 60 tablet 11  . hydrALAZINE (APRESOLINE) 25 MG tablet Take 1.5 tablets (37.5 mg total) by mouth every 8 (eight) hours.  90 tablet 0  . icosapent Ethyl (VASCEPA) 1 g capsule Take 2 capsules (2 g total) by mouth 2 (two) times daily. 120 capsule 5  . insulin glargine (LANTUS) 100 UNIT/ML injection Inject 0.24 mLs (24 Units total) into the skin daily. 10 mL 11  . isosorbide mononitrate (IMDUR) 30 MG 24 hr tablet Take 1 tablet (30 mg total) by mouth daily. 90 tablet 3  . Lancets MISC 1 each by Does not apply route 3 (three) times daily as needed. 300 each 3  . latanoprost (XALATAN) 0.005 % ophthalmic solution Place 1 drop into both eyes at bedtime.    . pantoprazole (PROTONIX) 40 MG tablet Take 1 tablet (40 mg total) by mouth 2 (two) times daily. 60 tablet 0  . Vitamin D, Ergocalciferol, (DRISDOL) 1.25 MG (50000 UNIT) CAPS capsule Take 1 capsule (50,000 Units total) by mouth every 7 (seven) days. 5 capsule 6   No current facility-administered medications for this visit.    Allergies:   Lisinopril   Social History:  The patient  reports that he has never smoked. He has never used smokeless tobacco. He reports current alcohol use of about 1.0 - 2.0 standard drink of alcohol per week. He reports that he does not use drugs.   Family History:  The patient's family history includes Healthy in his father; Hypertension in his mother; Stroke in his father.  ROS:  Please see the history of present illness.    All other systems are reviewed and otherwise negative.   PHYSICAL EXAM:  VS:  BP 118/70   Pulse 74   Ht 5\' 9"  (1.753 m)   Wt 237 lb (107.5 kg)   SpO2 96%   BMI 35.00 kg/m  BMI: Body mass index is 35 kg/m. Well nourished, well developed, in no acute distress HEENT: normocephalic, atraumatic Neck: no JVD, carotid bruits or masses Cardiac:  RRR; no significant murmurs, no rubs, or gallops Lungs:  CTA b/l, no wheezing, rhonchi or rales Abd: soft, nontender MS: no deformity or atrophy Ext: trace if any edema Skin: warm and dry, no rash Neuro:  No gross deficits appreciated Psych: euthymic mood, full  affect  ICD site is stable, no tethering or discomfort   EKG:  Not done today   Device interrogation done today and reviewed by myself:  Battery and lead measurements are good Episode list notes 4 episodes labled NSVT 2 EGMs available for review same day Rhythm is irregular and most likely AF   Echo (10/16): EF 15-20%, mild LVH, mild MR, normal RV size and systolic function.  - LHC (10/16): No significant CAD.  - CPX (12/17): Mild functional limitation.  - 04/2016 CMRI: EF 27%. RV normal. No evidence of infiltrative disease, myocarditis, or MI.  - Echo (5/18): EF 25-30%, mild LV dilation, mild LVH, normal RV size with mildly decreased - Echo (9/20): EF 30-35%, mild LVH, diffuse hypokinesis, normal RV  size and systolic function.  - Echo (10/21): EF 45%, mild global hypokinesis, moderate LVH, mildly decreased RV systolic function.    Recent Labs: 07/12/2019: B Natriuretic Peptide 247.7 02/06/2020: Magnesium 2.1 02/13/2020: ALT 22; Hemoglobin 9.6; Platelets 350 02/20/2020: TSH 6.463 03/02/2020: BUN 56; Creatinine, Ser 2.68; Potassium 4.5; Sodium 141  09/06/2019: Direct LDL 111.1 02/06/2020: Cholesterol 274; HDL 66; LDL Cholesterol 180; Total CHOL/HDL Ratio 4.2; Triglycerides 138; VLDL 28   Estimated Creatinine Clearance: 39.4 mL/min (A) (by C-G formula based on SCr of 2.68 mg/dL (H)).   Wt Readings from Last 3 Encounters:  03/17/20 237 lb (107.5 kg)  03/11/20 238 lb (108 kg)  03/11/20 237 lb 3.2 oz (107.6 kg)     Other studies reviewed: Additional studies/records reviewed today include: summarized above  ASSESSMENT AND PLAN:  1. ICD     Intact function, no programming changes made  2. NICM     ?ETOH 3. Chronic CHF (systolic)     No symptoms or exam findings to suggest volume OL     Follows closely with HF team  Last labs noted rising Creat, his entresto stopped and lasix reduced, planned for f/u visit/labs 03/19/20 Will get his BMET today  4. HTN     No changes, looks  good  5. Paroxysmal AFib 6. H/o DVTs, recurrent PE     CHA2DS2Vasc is 5, on Eliquis, appropriately dosed     Denies any bleeding or signs of bleeding     On amiodarone     TSH has been a little high, will recheck today, LFTs looked OK   Disposition: F/u with remotes Q 34mo, and in clinic in 1 yea, sooner if needed  Current medicines are reviewed at length with the patient today.  The patient did not have any concerns regarding medicines.  Venetia Night, PA-C 03/17/2020 1:19 PM     [place] [address] [address] [id number] [phone] (office)  [phone] (fax)

## 2020-03-17 ENCOUNTER — Other Ambulatory Visit: Payer: Self-pay

## 2020-03-17 ENCOUNTER — Encounter: Payer: Self-pay | Admitting: Physician Assistant

## 2020-03-17 ENCOUNTER — Telehealth (HOSPITAL_COMMUNITY): Payer: Self-pay | Admitting: Licensed Clinical Social Worker

## 2020-03-17 ENCOUNTER — Ambulatory Visit (INDEPENDENT_AMBULATORY_CARE_PROVIDER_SITE_OTHER): Payer: Medicare Other | Admitting: Physician Assistant

## 2020-03-17 VITALS — BP 118/70 | HR 74 | Ht 69.0 in | Wt 237.0 lb

## 2020-03-17 DIAGNOSIS — Z95810 Presence of automatic (implantable) cardiac defibrillator: Secondary | ICD-10-CM | POA: Diagnosis not present

## 2020-03-17 DIAGNOSIS — I5022 Chronic systolic (congestive) heart failure: Secondary | ICD-10-CM | POA: Diagnosis not present

## 2020-03-17 DIAGNOSIS — I5042 Chronic combined systolic (congestive) and diastolic (congestive) heart failure: Secondary | ICD-10-CM | POA: Diagnosis not present

## 2020-03-17 DIAGNOSIS — I428 Other cardiomyopathies: Secondary | ICD-10-CM

## 2020-03-17 DIAGNOSIS — I480 Paroxysmal atrial fibrillation: Secondary | ICD-10-CM | POA: Diagnosis not present

## 2020-03-17 LAB — BASIC METABOLIC PANEL
BUN/Creatinine Ratio: 16 (ref 9–20)
BUN: 37 mg/dL — ABNORMAL HIGH (ref 6–24)
CO2: 26 mmol/L (ref 20–29)
Calcium: 9.5 mg/dL (ref 8.7–10.2)
Chloride: 97 mmol/L (ref 96–106)
Creatinine, Ser: 2.25 mg/dL — ABNORMAL HIGH (ref 0.76–1.27)
GFR calc Af Amer: 38 mL/min/{1.73_m2} — ABNORMAL LOW (ref >59)
GFR calc non Af Amer: 33 mL/min/{1.73_m2} — ABNORMAL LOW (ref >59)
Glucose: 104 mg/dL — ABNORMAL HIGH (ref 65–99)
Potassium: 5.1 mmol/L (ref 3.5–5.2)
Sodium: 137 mmol/L (ref 134–144)

## 2020-03-17 LAB — TSH: TSH: 9.12 u[IU]/mL — ABNORMAL HIGH (ref 0.450–4.500)

## 2020-03-17 NOTE — Telephone Encounter (Signed)
Pt brought in completed [place] program application for utility assistance.  CSW submitted through DSS online portal and received confirmation of submission- pt informed  Will continue to follow and assist as needed  Jorge Ny, [facility] Desk#: [phone] Cell#: [phone]

## 2020-03-17 NOTE — Patient Instructions (Addendum)
Medication Instructions:   Your physician recommends that you continue on your current medications as directed. Please refer to the Current Medication list given to you today.   Your physician recommends that you continue on your current medications as directed. Please refer to the Current Medication list given to you today.   *If you need a refill on your cardiac medications before your next appointment, please call your pharmacy*   Lab Work: TSH AND BMET  TODAY    If you have labs (blood work) drawn today and your tests are completely normal, you will receive your results only by: Marland Kitchen MyChart Message (if you have MyChart) OR . A paper copy in the mail If you have any lab test that is abnormal or we need to change your treatment, we will call you to review the results.   Testing/Procedures: NONE ORDERED  TODAY   Follow-Up: At [facility], you and your health needs are our priority.  As part of our continuing mission to provide you with exceptional heart care, we have created designated Provider Care Teams.  These Care Teams include your primary Cardiologist (physician) and Advanced Practice Providers (APPs -  Physician Assistants and Nurse Practitioners) who all work together to provide you with the care you need, when you need it.  We recommend signing up for the patient portal called "MyChart".  Sign up information is provided on this After Visit Summary.  MyChart is used to connect with patients for Virtual Visits (Telemedicine).  Patients are able to view lab/test results, encounter notes, upcoming appointments, etc.  Non-urgent messages can be sent to your provider as well.   To learn more about what you can do with MyChart, go to [email or web address].    Your next appointment:   1 year(s)  The format for your next appointment:   In Person  Provider:   You may see Dr. Caryl Comes  or one of the following Advanced Practice Providers on your designated Care Team:    Chanetta Marshall, NP  Tommye Standard, PA-C  Legrand Como "Oda [place]ts, [place]    Other Instructions

## 2020-03-18 ENCOUNTER — Other Ambulatory Visit (HOSPITAL_COMMUNITY): Payer: Self-pay

## 2020-03-18 ENCOUNTER — Other Ambulatory Visit (HOSPITAL_COMMUNITY): Payer: Self-pay | Admitting: *Deleted

## 2020-03-18 DIAGNOSIS — E118 Type 2 diabetes mellitus with unspecified complications: Secondary | ICD-10-CM

## 2020-03-18 MED ORDER — FUROSEMIDE 40 MG PO TABS
40.0000 mg | ORAL_TABLET | Freq: Every morning | ORAL | 11 refills | Status: DC
Start: 2020-03-18 — End: 2020-05-15

## 2020-03-18 MED ORDER — APIXABAN 5 MG PO TABS
5.0000 mg | ORAL_TABLET | Freq: Two times a day (BID) | ORAL | 3 refills | Status: DC
Start: 2020-03-18 — End: 2020-07-06

## 2020-03-18 MED ORDER — CARVEDILOL 25 MG PO TABS
25.0000 mg | ORAL_TABLET | Freq: Two times a day (BID) | ORAL | 0 refills | Status: DC
Start: 2020-03-18 — End: 2020-04-15

## 2020-03-18 MED ORDER — ATORVASTATIN CALCIUM 80 MG PO TABS: 80.0000 mg | ORAL_TABLET | Freq: Every day | ORAL | 11 refills | Status: DC

## 2020-03-18 MED ORDER — AMIODARONE HCL 200 MG PO TABS
200.0000 mg | ORAL_TABLET | Freq: Every day | ORAL | 6 refills | Status: DC
Start: 2020-03-18 — End: 2020-03-20

## 2020-03-18 MED ORDER — ISOSORBIDE MONONITRATE ER 30 MG PO TB24: 30.0000 mg | ORAL_TABLET | Freq: Every day | ORAL | 3 refills | Status: DC

## 2020-03-18 MED ORDER — HYDRALAZINE HCL 25 MG PO TABS
37.5000 mg | ORAL_TABLET | Freq: Three times a day (TID) | ORAL | 0 refills | Status: DC
Start: 2020-03-18 — End: 2020-04-08

## 2020-03-18 NOTE — Progress Notes (Signed)
Paramedicine Encounter    Patient ID: Troy Rush, male    DOB: 21-Aug-1970, 49 y.o.   MRN: [id number]   Patient Care Team: [name], FNP as PCP - General (Family Medicine) Larey Dresser, MD as PCP - Cardiology (Cardiology) Conrad [place], NP as Nurse Practitioner (Cardiology) Larey Dresser, MD as Consulting Physician (Cardiology)  Patient Active Problem List   Diagnosis Date Noted  . Pulmonary embolism ([place]) 02/05/2020  . Acute kidney injury superimposed on CKD ([place])   . Chronic combined systolic and diastolic heart failure ([place])   . Atrial fibrillation with RVR ([place]) 02/01/2020  . NSVT (nonsustained ventricular tachycardia) ([place]) 02/01/2020  . Acute GI bleeding 02/01/2020  . DKA (diabetic ketoacidosis) ([place]) 02/01/2020  . Bilateral deafness 08/15/2019  . Type 2 diabetes mellitus with complication, with long-term current use of insulin ([place]) 08/15/2019  . Hemoglobin A1C between 7% and 9% indicating borderline diabetic control 08/15/2019  . Insomnia 08/15/2019  . Abdominal pain 11/14/2018  . Ischemic cardiomyopathy 10/14/2016  . NICM (nonischemic cardiomyopathy) ([place]) 09/15/2016  . Chronic systolic CHF (congestive heart failure) ([place]) 05/30/2016  . HTN (hypertension) 05/30/2016  . Deaf 05/30/2016  . Hyperkalemia 05/30/2016  . Diabetes ([place]) 03/21/2016  . Major depressive disorder, recurrent episode ([place]) 03/21/2016    Current Outpatient Medications:  .  amiodarone (PACERONE) 200 MG tablet, Take 1 tablet (200 mg total) by mouth daily., Disp: 30 tablet, Rfl: 6 .  apixaban (ELIQUIS) 5 MG TABS tablet, Take 5 mg by mouth 2 (two) times daily., Disp: , Rfl:  .  atorvastatin (LIPITOR) 80 MG tablet, Take 1 tablet (80 mg total) by mouth at bedtime., Disp: 30 tablet, Rfl: 11 .  carvedilol (COREG) 25 MG tablet, Take 1 tablet (25 mg total) by mouth 2 (two) times daily with a meal., Disp: 60 tablet, Rfl: 0 .  furosemide (LASIX) 40 MG tablet, Take 1 tablet (40 mg total) by mouth  in the morning., Disp: 60 tablet, Rfl: 11 .  hydrALAZINE (APRESOLINE) 25 MG tablet, Take 1.5 tablets (37.5 mg total) by mouth every 8 (eight) hours., Disp: 90 tablet, Rfl: 0 .  icosapent Ethyl (VASCEPA) 1 g capsule, Take 2 capsules (2 g total) by mouth 2 (two) times daily., Disp: 120 capsule, Rfl: 5 .  insulin glargine (LANTUS) 100 UNIT/ML injection, Inject 0.24 mLs (24 Units total) into the skin daily., Disp: 10 mL, Rfl: 11 .  isosorbide mononitrate (IMDUR) 30 MG 24 hr tablet, Take 1 tablet (30 mg total) by mouth daily., Disp: 90 tablet, Rfl: 3 .  Lancets MISC, 1 each by Does not apply route 3 (three) times daily as needed., Disp: 300 each, Rfl: 3 .  latanoprost (XALATAN) 0.005 % ophthalmic solution, Place 1 drop into both eyes at bedtime., Disp: , Rfl:  .  pantoprazole (PROTONIX) 40 MG tablet, Take 1 tablet (40 mg total) by mouth 2 (two) times daily., Disp: 60 tablet, Rfl: 0 .  Vitamin D, Ergocalciferol, (DRISDOL) 1.25 MG (50000 UNIT) CAPS capsule, Take 1 capsule (50,000 Units total) by mouth every 7 (seven) days., Disp: 5 capsule, Rfl: 6 .  citalopram (CELEXA) 40 MG tablet, Take 40 mg by mouth daily as needed (depression).  (Patient not taking: Reported on 03/18/2020), Disp: , Rfl:  .  fenofibrate 54 MG tablet, Take 1 tablet (54 mg total) by mouth daily., Disp: 30 tablet, Rfl: 2 Allergies  Allergen Reactions  . Lisinopril Cough      Social History   Socioeconomic History  .  Marital status: Legally Separated    Spouse name: Not on file  . Number of children: Not on file  . Years of education: Not on file  . Highest education level: Not on file  Occupational History  . Not on file  Tobacco Use  . Smoking status: Never Smoker  . Smokeless tobacco: Never Used  Vaping Use  . Vaping Use: Never used  Substance and Sexual Activity  . Alcohol use: Yes    Alcohol/week: 1.0 - 2.0 standard drink    Types: 1 - 2 Glasses of wine per week    Comment: rare  . Drug use: No  . Sexual activity:  Yes  Other Topics Concern  . Not on file  Social History Narrative  . Not on file   Social Determinants of Health   Financial Resource Strain: Medium Risk  . Difficulty of Paying Living Expenses: Somewhat hard  Food Insecurity: No Food Insecurity  . Worried About Charity fundraiser in the Last Year: Never true  . Ran Out of Food in the Last Year: Never true  Transportation Needs: No Transportation Needs  . Lack of Transportation (Medical): No  . Lack of Transportation (Non-Medical): No  Physical Activity:   . Days of Exercise per Week: Not on file  . Minutes of Exercise per Session: Not on file  Stress:   . Feeling of Stress : Not on file  Social Connections:   . Frequency of Communication with Friends and Family: Not on file  . Frequency of Social Gatherings with Friends and Family: Not on file  . Attends Religious Services: Not on file  . Active Member of Clubs or Organizations: Not on file  . Attends Archivist Meetings: Not on file  . Marital Status: Not on file  Intimate Partner Violence:   . Fear of Current or Ex-Partner: Not on file  . Emotionally Abused: Not on file  . Physically Abused: Not on file  . Sexually Abused: Not on file    Physical Exam      Future Appointments  Date Time Provider [place]  03/19/2020 10:00 AM [facility] PHARMACY [facility] None  03/23/2020  8:35 AM [facility] DEVICE REMOTES [facility]  04/16/2020  9:30 AM [facility] PA/NP [facility] None  06/09/2020 10:20 AM [name], FNP [facility] None  06/22/2020  8:35 AM [facility] DEVICE REMOTES [facility]  09/21/2020  8:35 AM [facility] DEVICE REMOTES [facility]  12/21/2020  8:35 AM [facility] DEVICE REMOTES [facility]  03/22/2021  8:35 AM [facility] DEVICE REMOTES [facility]  06/21/2021  8:35 AM [facility] DEVICE REMOTES [facility]  09/20/2021  8:35 AM [facility] DEVICE REMOTES [facility]  12/20/2021  8:35 AM [facility] DEVICE REMOTES [facility]    BP 140/84   Pulse 78   Temp (!) 96.7 F (35.9 C)   Resp 20   Wt 240 lb (108.9 kg)   SpO2 97%   BMI 35.44 kg/m   Weight yesterday-237 @ PCP  Last visit weight-238  Pt reports he is doing ok.  He dropped off paperwork for the [employer] assistance.  Pt reports he is feeling ok-no short of breath. No dizziness. No c/p. Taking all meds.  His pantoprazole and carvedilol did not get [facility], so I called [facility] again to pull his profile over.  He needs carvedilol filled from Friday AM to rest of week. He only has it for Wednesday night and Thursday AM/PM.  He has  clinic appointment tomor at 57 for pharmacy visit.  He said he missed his pills this morning.  And he is out of the pantoprazole.   -clinic sent in meds to [place], [place] will p/u meds tomor needed and meet him at clinic and make those changes.   [name], [place] [facility] Paramedic  03/18/20

## 2020-03-19 ENCOUNTER — Inpatient Hospital Stay (HOSPITAL_COMMUNITY)
Admission: RE | Admit: 2020-03-19 | Discharge: 2020-03-19 | Disposition: A | Discharge status: Home / Self Care | Payer: Medicare Other | Source: Ambulatory Visit

## 2020-03-19 ENCOUNTER — Telehealth (HOSPITAL_COMMUNITY): Payer: Self-pay

## 2020-03-19 NOTE — Telephone Encounter (Signed)
Attempted to reach Troy Rush to follow up from missed appointment and home visit for medication reconciliation. No success in contacting patient. Phone calls and texts were sent. Katie reports she will follow up next week.

## 2020-03-20 ENCOUNTER — Telehealth (HOSPITAL_COMMUNITY): Payer: Self-pay | Admitting: Adult Health

## 2020-03-20 ENCOUNTER — Other Ambulatory Visit: Payer: Self-pay | Admitting: *Deleted

## 2020-03-20 DIAGNOSIS — Z79899 Other long term (current) drug therapy: Secondary | ICD-10-CM

## 2020-03-20 MED ORDER — AMIODARONE HCL 200 MG PO TABS
100.0000 mg | ORAL_TABLET | Freq: Every day | ORAL | 1 refills | Status: DC
Start: 2020-03-20 — End: 2020-05-11

## 2020-03-20 NOTE — Telephone Encounter (Signed)
Pt called to r/s appt, the next available was 12/30, he is wondering if he can get an earlier appt, please call [phone]. Thanks

## 2020-03-23 ENCOUNTER — Ambulatory Visit (INDEPENDENT_AMBULATORY_CARE_PROVIDER_SITE_OTHER): Payer: Medicare Other

## 2020-03-23 DIAGNOSIS — I255 Ischemic cardiomyopathy: Secondary | ICD-10-CM

## 2020-03-23 LAB — CUP PACEART REMOTE DEVICE CHECK
Battery Remaining Longevity: 150 mo
Battery Remaining Percentage: 100 %
Brady Statistic RV Percent Paced: 0 %
Date Time Interrogation Session: 20211213005300
HighPow Impedance: 62 Ohm
Implantable Lead Implant Date: 20180706
Implantable Lead Location: 753860
Implantable Lead Model: 293
Implantable Lead Serial Number: 432676
Implantable Pulse Generator Implant Date: 20180706
Lead Channel Impedance Value: 420 Ohm
Lead Channel Setting Pacing Amplitude: 2.5 V
Lead Channel Setting Pacing Pulse Width: 0.4 ms
Lead Channel Setting Sensing Sensitivity: 0.5 mV
Pulse Gen Serial Number: 236570

## 2020-03-24 ENCOUNTER — Other Ambulatory Visit (HOSPITAL_COMMUNITY): Payer: Self-pay

## 2020-03-24 NOTE — Progress Notes (Signed)
Came by for our appointment time this morning. No answer at door. Door is locked. I texted him several times with no response, called the TTY phone line and it went to a recording as well.  Unable to make contact today.  Will resch him if he responds back.   [name], EMT-Paramedic  03/24/20

## 2020-03-24 NOTE — Progress Notes (Signed)
Paramedicine Encounter    Patient ID: Troy Rush, male    DOB: 03-14-1971, 49 y.o.   MRN: [id number]   Patient Care Team: [name], FNP as PCP - General (Family Medicine) Larey Dresser, MD as PCP - Cardiology (Cardiology) Conrad Clinch, NP as Nurse Practitioner (Cardiology) Larey Dresser, MD as Consulting Physician (Cardiology)  Patient Active Problem List   Diagnosis Date Noted  . Pulmonary embolism ([place]) 02/05/2020  . Acute kidney injury superimposed on CKD ([place])   . Chronic combined systolic and diastolic heart failure ([place])   . Atrial fibrillation with RVR ([place]) 02/01/2020  . NSVT (nonsustained ventricular tachycardia) ([place]) 02/01/2020  . Acute GI bleeding 02/01/2020  . DKA (diabetic ketoacidosis) ([place]) 02/01/2020  . Bilateral deafness 08/15/2019  . Type 2 diabetes mellitus with complication, with long-term current use of insulin ([place]) 08/15/2019  . Hemoglobin A1C between 7% and 9% indicating borderline diabetic control 08/15/2019  . Insomnia 08/15/2019  . Abdominal pain 11/14/2018  . Ischemic cardiomyopathy 10/14/2016  . NICM (nonischemic cardiomyopathy) ([place]) 09/15/2016  . Chronic systolic CHF (congestive heart failure) ([place]) 05/30/2016  . HTN (hypertension) 05/30/2016  . Deaf 05/30/2016  . Hyperkalemia 05/30/2016  . Diabetes ([place]) 03/21/2016  . Major depressive disorder, recurrent episode ([place]) 03/21/2016    Current Outpatient Medications:  .  amiodarone (PACERONE) 200 MG tablet, Take 0.5 tablets (100 mg total) by mouth daily., Disp: 90 tablet, Rfl: 1 .  apixaban (ELIQUIS) 5 MG TABS tablet, Take 1 tablet (5 mg total) by mouth 2 (two) times daily., Disp: 60 tablet, Rfl: 3 .  atorvastatin (LIPITOR) 80 MG tablet, Take 1 tablet (80 mg total) by mouth at bedtime., Disp: 30 tablet, Rfl: 11 .  carvedilol (COREG) 25 MG tablet, Take 1 tablet (25 mg total) by mouth 2 (two) times daily with a meal., Disp: 60 tablet, Rfl: 0 .  furosemide (LASIX) 40 MG tablet, Take 1  tablet (40 mg total) by mouth in the morning., Disp: 60 tablet, Rfl: 11 .  hydrALAZINE (APRESOLINE) 25 MG tablet, Take 1.5 tablets (37.5 mg total) by mouth every 8 (eight) hours., Disp: 90 tablet, Rfl: 0 .  icosapent Ethyl (VASCEPA) 1 g capsule, Take 2 capsules (2 g total) by mouth 2 (two) times daily., Disp: 120 capsule, Rfl: 5 .  insulin glargine (LANTUS) 100 UNIT/ML injection, Inject 0.24 mLs (24 Units total) into the skin daily., Disp: 10 mL, Rfl: 11 .  isosorbide mononitrate (IMDUR) 30 MG 24 hr tablet, Take 1 tablet (30 mg total) by mouth daily., Disp: 90 tablet, Rfl: 3 .  Lancets MISC, 1 each by Does not apply route 3 (three) times daily as needed., Disp: 300 each, Rfl: 3 .  latanoprost (XALATAN) 0.005 % ophthalmic solution, Place 1 drop into both eyes at bedtime., Disp: , Rfl:  .  Vitamin D, Ergocalciferol, (DRISDOL) 1.25 MG (50000 UNIT) CAPS capsule, Take 1 capsule (50,000 Units total) by mouth every 7 (seven) days., Disp: 5 capsule, Rfl: 6 .  citalopram (CELEXA) 40 MG tablet, Take 40 mg by mouth daily as needed (depression).  (Patient not taking: No sig reported), Disp: , Rfl:  .  fenofibrate 54 MG tablet, Take 1 tablet (54 mg total) by mouth daily., Disp: 30 tablet, Rfl: 2 .  pantoprazole (PROTONIX) 40 MG tablet, Take 1 tablet (40 mg total) by mouth 2 (two) times daily. (Patient not taking: Reported on 03/24/2020), Disp: 60 tablet, Rfl: 0 Allergies  Allergen Reactions  . Lisinopril Cough  Social History   Socioeconomic History  . Marital status: Legally Separated    Spouse name: Not on file  . Number of children: Not on file  . Years of education: Not on file  . Highest education level: Not on file  Occupational History  . Not on file  Tobacco Use  . Smoking status: Never Smoker  . Smokeless tobacco: Never Used  Vaping Use  . Vaping Use: Never used  Substance and Sexual Activity  . Alcohol use: Yes    Alcohol/week: 1.0 - 2.0 standard drink    Types: 1 - 2 Glasses of  wine per week    Comment: rare  . Drug use: No  . Sexual activity: Yes  Other Topics Concern  . Not on file  Social History Narrative  . Not on file   Social Determinants of Health   Financial Resource Strain: Medium Risk  . Difficulty of Paying Living Expenses: Somewhat hard  Food Insecurity: No Food Insecurity  . Worried About Charity fundraiser in the Last Year: Never true  . Ran Out of Food in the Last Year: Never true  Transportation Needs: No Transportation Needs  . Lack of Transportation (Medical): No  . Lack of Transportation (Non-Medical): No  Physical Activity: Not on file  Stress: Not on file  Social Connections: Not on file  Intimate Partner Violence: Not on file    Physical Exam      Future Appointments  Date Time Provider [place]  04/09/2020 11:00 AM [facility] PHARMACY [facility] None  04/16/2020  9:30 AM [facility] PA/NP [facility] None  04/17/2020 10:45 AM [facility] LAB [facility]  06/09/2020 10:20 AM [name], FNP [facility] None  06/22/2020  8:35 AM [facility] DEVICE REMOTES [facility]  09/21/2020  8:35 AM [facility] DEVICE REMOTES [facility]  12/21/2020  8:35 AM [facility] DEVICE REMOTES [facility]  03/22/2021  8:35 AM [facility] DEVICE REMOTES [facility]  06/21/2021  8:35 AM [facility] DEVICE REMOTES [facility]  09/20/2021  8:35 AM [facility] DEVICE REMOTES [facility]  12/20/2021  8:35 AM [facility] DEVICE REMOTES [facility]    BP (!) 154/98   Pulse 82   Temp 98.2 F (36.8 C)   Resp 20   Wt 237 lb (107.5 kg)   SpO2 99%   BMI 35.00 kg/m   Weight yesterday-? Last visit weight-240  Pt texted me back from earlier-he said he was sleeping when I arrived this morning.  When I arrived this afternoon he was sleeping again. He reports that he stays up late watching tv and is tired during day.  He went to PCP office last week and due  to lab results on his TSH he is to decrease his amio to 100mg  daily.  He has been without his carvedilol for about a week now. My partner had picked it up from pharmacy to meet him at clinic last week but he did not show up. He could not meet me yesterday due to some errands he was out doing.  He is out of his pantoprazole.  meds verified and pill box refilled.  Sent message to PCP regarding the pantoprazole.  B/p elevated but he has been without his carvedilol due to him no showing to appointment.   Refills needed--eliquis   [name], [place] Paramedic  03/24/20

## 2020-03-25 ENCOUNTER — Other Ambulatory Visit: Payer: Self-pay | Admitting: Family Medicine

## 2020-03-25 MED ORDER — PANTOPRAZOLE SODIUM 40 MG PO TBEC
40.0000 mg | DELAYED_RELEASE_TABLET | Freq: Two times a day (BID) | ORAL | 3 refills | Status: DC
Start: 2020-03-25 — End: 2020-10-07

## 2020-03-27 NOTE — Progress Notes (Signed)
PCP: Cammie Sickle Cardiology: Dr. Aundra Dubin  HPI:  49 y.o. with deafness, HTN, DM, and chronic systolic CHF presents for followup of CHF.  Patient had no known cardiac problems until 10/16.  At that time, he developed exertional dyspnea and was admitted to a hospital in [place], [place] with acute systolic CHF.  Echo 10/16 showed EF 15-20%.  Cardiac cath showed no significant coronary disease.  He was diuresed and started on cardiac meds. Subsequently, he moved to [facility].  Now working in a [employer].  Repeat echo in 5/18 showed persistently low EF, 25-30%.  He had [place] placed.    He was admitted briefly with CHF exacerbation in 8/20.    Echo in 9/20 showed EF 30-35% with diffuse hypokinesis.    Ultrasound in 4/21 showed right leg DVT and CT showed a PE.  No obvious trigger (no long trip, no surgery, no prolonged immobility).  He was started on apixaban.     He quit taking his meds in 8/21 and was drinking heavily due to depression, later restarted meds.  In 10/21, he was admitted with DKA and atrial fibrillation/RVR (1st known episode).  He converted to NSR spontaneously.  [name] was stopped due to AKI.  He had melena/hematochezia and EGD showed gastritis/esophagitis.  Apixaban was stopped.  He went home but then was re-admitted later in 10/21 with bilateral PEs.    Echo in 10/21 showed EF 45%, mild global hypokinesis, moderate LVH, mildly decreased RV systolic function.    Recently presented to HF Clinic on 02/20/20 with [facility]. Was seen the previous week by Dr. Aundra Dubin on 02/13/20 and was found to be fluid overloaded. Weight was up 4 lbs and he was symptomatic w/ exertional dyspnea and orthopnea. [name] was restarted at 24-26 mg BID and furosemide was increased to 40 mg BID. He presented back to clinic for follow up. Sign language interpreter was used.  He reported some improvement in symptoms, able to walk on flat ground w/o dyspnea but still struggled up inclines. LEE  had resolved. Weight was down 7 lbs and His ReDs clip reading was WNL at 32%. BP was well controlled at 114/78. He was tolerating Entresto ok. He had occasional mild positional dizziness when he stands up but quickly resolves. No syncope/ near syncope.   Today he returns to HF clinic for pharmacist medication titration. At last visit with APP, Furosemide was decreased to 40 mg every AM and 20 mg every PM due to Scr bump on labs. Repeat labs were obtained 2 weeks later and Scr continued to rise, suggesting dehydration, so furosemide was decreased to 40 mg daily and Entresto was discontinued. Additionally, patient's hydralazine was increased to 50 mg TID for BP consistently 170/100s at home on 04/08/20.    Overall he is feeling well today. Sign language interpreter present. No dizziness, lightheadedness, chest pain or palpitations. Gets SOB going up inclines or stairs. Otherwise, does well walking on flat ground. Weight has increased slowly from last clinic visit, but was dry on last clinic check. Has been stable at home. Takes torsemide 40 mg daily and has not needed any extra. No LEE today. He states sometimes he will have some swelling in his ankles, but this resolves over the course of the day. No PND/orthopnea. BP in clinic today 172/102. Took all his morning medications. Taking all medications with the help of paramedicine.    HF Medications: Carvedilol 25 mg BID Hydralazine 50 mg TID Isosorbide mononitrate 30 mg daily Furosemide  40 mg daily  Has the patient been experiencing any side effects to the medications prescribed?  no  Does the patient have any problems obtaining medications due to transportation or finances?   No - has [employer]  Understanding of regimen: fair Understanding of indications: fair Potential of compliance: fair Patient understands to avoid NSAIDs. Patient understands to avoid decongestants.    Pertinent Lab Values (03/17/20): Marland Kitchen Serum creatinine 2.25, BUN 37,  Potassium 5.1, Sodium 137  Vital Signs: . Weight: 240 lbs . Blood pressure: 172/102 . Heart rate: 72   Assessment: 1. Chronic systolic CHF: Nonischemic cardiomyopathy, etiology uncertain. Consider prior ETOH or HTN.  He has a [place].  cMRI in 1/18 with EF 27%, RV normal, no evidence of infiltrative disease, myocarditis, or MI.  Echo in 9/20 showed EF 30-35%.  Echo in 10/21 showed EF up to 45%.  - NYHA II, euvolemic on exam - Continue furosemide 40 mg daily.  - Continue carvedilol 25 mg BID      - He is off spironolactone for now with prior hyperkalemia. Entresto held for recent Scr bump.  - Increase hydralazine to 75 mg TID and Imdur to 60 mg daily. - avoid SGLT2i for now given recent admission for DKA  2. Type II diabetes: management per PCP.  On insulin. Would benefit from SGLT2 inhibitor in the future.  Would avoid for now given increased risk for GU infections and recent admission for DKA - A1C 7.9% on 03/11/20 3. HTN: uncontrolled on current regimen.  - Increase hydralazine and imdur as above  4. Deafness: Sign language interpreter present at visit.  5. CKD: Stage 3.  Last SCr 2.25.  6. PE/DVT: 4/21.  Unclear cause, no prolonged trip, prolonged immobility, or history of malignancy. No FH clotting. Recurrent PE in 10/21 while Eliquis on hold with bleeding from esophagitis/gastritis.  Do no think this was an Eliquis failure.  - Continue Eliquis, think he will need long-term with no clear trigger for VTE.  7. Hyperlipidemia: LDL very high when last checked, not clear that he had been taking statin.  Goal LDL < 70 with diabetes.  - continue atorvastatin 80 mg daily (paramedicine now helping w/ med compliance)  - repeat FLP in 6 weeks, if not at goal add Zetia +/- [id number]  8. Atrial fibrillation: First noted in setting of DKA in 10/21.  He is now on amiodarone  - Continue amiodarone  200 mg daily.  - Not ideal to continue amiodarone long-term, will likely stop in the future  if no further AF and arrange for ablation if it recurs.  - Continue Eliquis.  9. ETOH abuse: Encouraged him to quit.  ETOH may play a role in both cardiomyopathy and atrial fibrillation.   10. Noncompliance: Significant problems with noncompliance around medication use.   - paramedicine now following, appreciate their assistance     Plan: 1) Medication changes: Based on clinical presentation, vital signs and recent labs will increase hydralazine to 75 mg TID and Imdur to 60 mg daily.   2) Follow-up: 1 week with [facility]   Audry Riles, PharmD, BCPS, BCCP, CPP Heart Failure Clinic Pharmacist [phone]

## 2020-03-31 ENCOUNTER — Telehealth (HOSPITAL_COMMUNITY): Payer: Self-pay | Admitting: Licensed Clinical Social Worker

## 2020-03-31 ENCOUNTER — Other Ambulatory Visit (HOSPITAL_COMMUNITY): Payer: Self-pay

## 2020-03-31 NOTE — Progress Notes (Signed)
Paramedicine Encounter    Patient ID: Troy Rush, male    DOB: [birth year], 49 y.o.   MRN: [id number]   Patient Care Team: [name], FNP as PCP - General (Family Medicine) Larey Dresser, MD as PCP - Cardiology (Cardiology) Conrad [place], NP as Nurse Practitioner (Cardiology) Larey Dresser, MD as Consulting Physician (Cardiology)  Patient Active Problem List   Diagnosis Date Noted  . Pulmonary embolism ([place]) 02/05/2020  . Acute kidney injury superimposed on CKD ([place])   . Chronic combined systolic and diastolic heart failure ([place])   . Atrial fibrillation with RVR ([place]) 02/01/2020  . NSVT (nonsustained ventricular tachycardia) ([place]) 02/01/2020  . Acute GI bleeding 02/01/2020  . DKA (diabetic ketoacidosis) ([place]) 02/01/2020  . Bilateral deafness 08/15/2019  . Type 2 diabetes mellitus with complication, with long-term current use of insulin ([place]) 08/15/2019  . Hemoglobin A1C between 7% and 9% indicating borderline diabetic control 08/15/2019  . Insomnia 08/15/2019  . Abdominal pain 11/14/2018  . Ischemic cardiomyopathy 10/14/2016  . NICM (nonischemic cardiomyopathy) ([place]) 09/15/2016  . Chronic systolic CHF (congestive heart failure) ([place]) 05/30/2016  . HTN (hypertension) 05/30/2016  . Deaf 05/30/2016  . Hyperkalemia 05/30/2016  . Diabetes ([place]) 03/21/2016  . Major depressive disorder, recurrent episode ([place]) 03/21/2016    Current Outpatient Medications:  .  amiodarone (PACERONE) 200 MG tablet, Take 0.5 tablets (100 mg total) by mouth daily., Disp: 90 tablet, Rfl: 1 .  apixaban (ELIQUIS) 5 MG TABS tablet, Take 1 tablet (5 mg total) by mouth 2 (two) times daily., Disp: 60 tablet, Rfl: 3 .  atorvastatin (LIPITOR) 80 MG tablet, Take 1 tablet (80 mg total) by mouth at bedtime., Disp: 30 tablet, Rfl: 11 .  carvedilol (COREG) 25 MG tablet, Take 1 tablet (25 mg total) by mouth 2 (two) times daily with a meal., Disp: 60 tablet, Rfl: 0 .  furosemide (LASIX) 40 MG tablet, Take 1  tablet (40 mg total) by mouth in the morning., Disp: 60 tablet, Rfl: 11 .  hydrALAZINE (APRESOLINE) 25 MG tablet, Take 1.5 tablets (37.5 mg total) by mouth every 8 (eight) hours., Disp: 90 tablet, Rfl: 0 .  icosapent Ethyl (VASCEPA) 1 g capsule, Take 2 capsules (2 g total) by mouth 2 (two) times daily., Disp: 120 capsule, Rfl: 5 .  insulin glargine (LANTUS) 100 UNIT/ML injection, Inject 0.24 mLs (24 Units total) into the skin daily., Disp: 10 mL, Rfl: 11 .  isosorbide mononitrate (IMDUR) 30 MG 24 hr tablet, Take 1 tablet (30 mg total) by mouth daily., Disp: 90 tablet, Rfl: 3 .  Lancets MISC, 1 each by Does not apply route 3 (three) times daily as needed., Disp: 300 each, Rfl: 3 .  latanoprost (XALATAN) 0.005 % ophthalmic solution, Place 1 drop into both eyes at bedtime., Disp: , Rfl:  .  pantoprazole (PROTONIX) 40 MG tablet, Take 1 tablet (40 mg total) by mouth 2 (two) times daily., Disp: 180 tablet, Rfl: 3 .  Vitamin D, Ergocalciferol, (DRISDOL) 1.25 MG (50000 UNIT) CAPS capsule, Take 1 capsule (50,000 Units total) by mouth every 7 (seven) days., Disp: 5 capsule, Rfl: 6 .  citalopram (CELEXA) 40 MG tablet, Take 40 mg by mouth daily as needed (depression).  (Patient not taking: No sig reported), Disp: , Rfl:  .  fenofibrate 54 MG tablet, Take 1 tablet (54 mg total) by mouth daily., Disp: 30 tablet, Rfl: 2 Allergies  Allergen Reactions  . Lisinopril Cough      Social History  Socioeconomic History  . Marital status: Legally Separated    Spouse name: Not on file  . Number of children: Not on file  . Years of education: Not on file  . Highest education level: Not on file  Occupational History  . Not on file  Tobacco Use  . Smoking status: Never Smoker  . Smokeless tobacco: Never Used  Vaping Use  . Vaping Use: Never used  Substance and Sexual Activity  . Alcohol use: Yes    Alcohol/week: 1.0 - 2.0 standard drink    Types: 1 - 2 Glasses of wine per week    Comment: rare  . Drug  use: No  . Sexual activity: Yes  Other Topics Concern  . Not on file  Social History Narrative  . Not on file   Social Determinants of Health   Financial Resource Strain: Medium Risk  . Difficulty of Paying Living Expenses: Somewhat hard  Food Insecurity: No Food Insecurity  . Worried About Charity fundraiser in the Last Year: Never true  . Ran Out of Food in the Last Year: Never true  Transportation Needs: No Transportation Needs  . Lack of Transportation (Medical): No  . Lack of Transportation (Non-Medical): No  Physical Activity: Not on file  Stress: Not on file  Social Connections: Not on file  Intimate Partner Violence: Not on file    Physical Exam      Future Appointments  Date Time Provider [place]  04/09/2020 11:00 AM [facility] PHARMACY [facility] None  04/16/2020  9:30 AM [facility] PA/NP [facility] None  04/17/2020 10:45 AM [facility]  05/15/2020  4:00 PM Deboraha Sprang, MD [facility]  06/09/2020 10:20 AM [name], FNP [facility] None  06/22/2020  8:35 AM [facility] DEVICE REMOTES [facility]  09/21/2020  8:35 AM [facility] DEVICE REMOTES [facility]  12/21/2020  8:35 AM [facility] DEVICE REMOTES [facility]  03/22/2021  8:35 AM [facility] DEVICE REMOTES [facility]  06/21/2021  8:35 AM [facility] DEVICE REMOTES [facility]  09/20/2021  8:35 AM [facility] DEVICE REMOTES [facility]  12/20/2021  8:35 AM [facility] DEVICE REMOTES [facility]    BP (!) 144/84   Pulse 70   Temp (!) 97.3 F (36.3 C)   Resp 18   Wt 234 lb (106.1 kg)   SpO2 96%   BMI 34.56 kg/m   Weight yesterday-? Last visit weight-237  Pt reports he is doing fine. No issues, concerns or complaints at this time.  He denies increased sob, no dizziness. No c/p. No edema noted. His weight is down a few lbs. Doing much better with taking meds.  I feel like  soon he can switch to bubble packs once all meds are coming [facility] to make easy transition.  meds verified and pill box refilled.  Refilled- vascepa and hydralazine   [name], [facility] Paramedic  03/31/20

## 2020-03-31 NOTE — Telephone Encounter (Signed)
CSW received email from pt informing that he has not been approved yet for [place] program and is worried about $28.32 [employer] bill which is overdue and he is being told that it will go to collection if it is not paid as some of that bill was from the previous month that he already owed.  CSW able to pay bill through [facility] but explained we would .  Pt very appreciative of assistance.  Has [employer] application and [employer] program application pending so hopefully will have assistance for future bills.  Will continue to follow and assist as needed  Jorge Ny, [facility] Desk#: [phone] Cell#: [phone]

## 2020-04-03 DIAGNOSIS — Z23 Encounter for immunization: Secondary | ICD-10-CM | POA: Diagnosis not present

## 2020-04-07 ENCOUNTER — Other Ambulatory Visit (HOSPITAL_COMMUNITY): Payer: Self-pay

## 2020-04-07 NOTE — Progress Notes (Signed)
Paramedicine Encounter    Patient ID: Troy Rush, male    DOB: September 28, 1970, 49 y.o.   MRN: [id number]   Patient Care Team: [name], FNP as PCP - General (Family Medicine) Larey Dresser, MD as PCP - Cardiology (Cardiology) Conrad , NP as Nurse Practitioner (Cardiology) Larey Dresser, MD as Consulting Physician (Cardiology)  Patient Active Problem List   Diagnosis Date Noted  . Pulmonary embolism ([place]) 02/05/2020  . Acute kidney injury superimposed on CKD ([place])   . Chronic combined systolic and diastolic heart failure ([place])   . Atrial fibrillation with RVR ([place]) 02/01/2020  . NSVT (nonsustained ventricular tachycardia) ([place]) 02/01/2020  . Acute GI bleeding 02/01/2020  . DKA (diabetic ketoacidosis) ([place]) 02/01/2020  . Bilateral deafness 08/15/2019  . Type 2 diabetes mellitus with complication, with long-term current use of insulin ([place]) 08/15/2019  . Hemoglobin A1C between 7% and 9% indicating borderline diabetic control 08/15/2019  . Insomnia 08/15/2019  . Abdominal pain 11/14/2018  . Ischemic cardiomyopathy 10/14/2016  . NICM (nonischemic cardiomyopathy) ([place]) 09/15/2016  . Chronic systolic CHF (congestive heart failure) ([place]) 05/30/2016  . HTN (hypertension) 05/30/2016  . Deaf 05/30/2016  . Hyperkalemia 05/30/2016  . Diabetes ([place]) 03/21/2016  . Major depressive disorder, recurrent episode ([place]) 03/21/2016    Current Outpatient Medications:  .  amiodarone (PACERONE) 200 MG tablet, Take 0.5 tablets (100 mg total) by mouth daily., Disp: 90 tablet, Rfl: 1 .  apixaban (ELIQUIS) 5 MG TABS tablet, Take 1 tablet (5 mg total) by mouth 2 (two) times daily., Disp: 60 tablet, Rfl: 3 .  atorvastatin (LIPITOR) 80 MG tablet, Take 1 tablet (80 mg total) by mouth at bedtime., Disp: 30 tablet, Rfl: 11 .  carvedilol (COREG) 25 MG tablet, Take 1 tablet (25 mg total) by mouth 2 (two) times daily with a meal., Disp: 60 tablet, Rfl: 0 .  citalopram (CELEXA) 40 MG tablet, Take  40 mg by mouth daily as needed (depression).  (Patient not taking: No sig reported), Disp: , Rfl:  .  fenofibrate 54 MG tablet, Take 1 tablet (54 mg total) by mouth daily., Disp: 30 tablet, Rfl: 2 .  furosemide (LASIX) 40 MG tablet, Take 1 tablet (40 mg total) by mouth in the morning., Disp: 60 tablet, Rfl: 11 .  hydrALAZINE (APRESOLINE) 25 MG tablet, Take 1.5 tablets (37.5 mg total) by mouth every 8 (eight) hours., Disp: 90 tablet, Rfl: 0 .  icosapent Ethyl (VASCEPA) 1 g capsule, Take 2 capsules (2 g total) by mouth 2 (two) times daily., Disp: 120 capsule, Rfl: 5 .  insulin glargine (LANTUS) 100 UNIT/ML injection, Inject 0.24 mLs (24 Units total) into the skin daily., Disp: 10 mL, Rfl: 11 .  isosorbide mononitrate (IMDUR) 30 MG 24 hr tablet, Take 1 tablet (30 mg total) by mouth daily., Disp: 90 tablet, Rfl: 3 .  Lancets MISC, 1 each by Does not apply route 3 (three) times daily as needed., Disp: 300 each, Rfl: 3 .  latanoprost (XALATAN) 0.005 % ophthalmic solution, Place 1 drop into both eyes at bedtime., Disp: , Rfl:  .  pantoprazole (PROTONIX) 40 MG tablet, Take 1 tablet (40 mg total) by mouth 2 (two) times daily., Disp: 180 tablet, Rfl: 3 .  Vitamin D, Ergocalciferol, (DRISDOL) 1.25 MG (50000 UNIT) CAPS capsule, Take 1 capsule (50,000 Units total) by mouth every 7 (seven) days., Disp: 5 capsule, Rfl: 6 Allergies  Allergen Reactions  . Lisinopril Cough      Social History  Socioeconomic History  . Marital status: Legally Separated    Spouse name: Not on file  . Number of children: Not on file  . Years of education: Not on file  . Highest education level: Not on file  Occupational History  . Not on file  Tobacco Use  . Smoking status: Never Smoker  . Smokeless tobacco: Never Used  Vaping Use  . Vaping Use: Never used  Substance and Sexual Activity  . Alcohol use: Yes    Alcohol/week: 1.0 - 2.0 standard drink    Types: 1 - 2 Glasses of wine per week    Comment: rare  . Drug  use: No  . Sexual activity: Yes  Other Topics Concern  . Not on file  Social History Narrative  . Not on file   Social Determinants of Health   Financial Resource Strain: Medium Risk  . Difficulty of Paying Living Expenses: Somewhat hard  Food Insecurity: No Food Insecurity  . Worried About Charity fundraiser in the Last Year: Never true  . Ran Out of Food in the Last Year: Never true  Transportation Needs: No Transportation Needs  . Lack of Transportation (Medical): No  . Lack of Transportation (Non-Medical): No  Physical Activity: Not on file  Stress: Not on file  Social Connections: Not on file  Intimate Partner Violence: Not on file    Physical Exam      Future Appointments  Date Time Provider [place]  04/09/2020 11:00 AM [facility] PHARMACY [facility] None  04/16/2020  9:30 AM [facility] PA/NP [facility] None  04/17/2020 10:45 AM [facility]  05/15/2020  4:00 PM Deboraha Sprang, MD [facility]  06/09/2020 10:20 AM [name], FNP [facility] None  06/22/2020  8:35 AM [facility] DEVICE REMOTES [facility]  09/21/2020  8:35 AM [facility] DEVICE REMOTES [facility]  12/21/2020  8:35 AM [facility] DEVICE REMOTES [facility]  03/22/2021  8:35 AM [facility] DEVICE REMOTES [facility]  06/21/2021  8:35 AM [facility] DEVICE REMOTES [facility]  09/20/2021  8:35 AM [facility] DEVICE REMOTES [facility]  12/20/2021  8:35 AM [facility] DEVICE REMOTES [facility]    BP (!) 172/104   Pulse 88   Temp 98.1 F (36.7 C)   Resp 20   Wt 236 lb (107 kg)   SpO2 97%   BMI 34.85 kg/m   Weight yesterday-? Last visit weight-234  Pt reports he is doing ok. He denies c/p, no dizziness, no increased sob. No missed doses of his meds. \meds verified and pill box refilled.  He has been checking his b/p at home and those readings have been  elevated--diastolic over 57-322 consistently and systolic as high as 025. He states he has taken meds today.  I checked home b/p machine against my manuel b/p and it matched up.  He does have a smidge of edema to his LE, He denies h/a or vision issues.  I wonder if he has sleep apnea--he is sleepy a lot during the day-have missed a couple appointment times due to him sleeping.  Spoke to jasmine in clinic and she spoke to [place] and med change was to increase hydralazine to 50mg  TID.  Also mentioned to see if he needs a sleep study done-she will mention that to see if he does need one or not.     [name], [facility] Paramedic  04/07/20

## 2020-04-07 NOTE — Progress Notes (Signed)
Remote ICD transmission.   

## 2020-04-08 ENCOUNTER — Telehealth (HOSPITAL_COMMUNITY): Payer: Self-pay | Admitting: *Deleted

## 2020-04-08 MED ORDER — HYDRALAZINE HCL 50 MG PO TABS
50.0000 mg | ORAL_TABLET | Freq: Three times a day (TID) | ORAL | 3 refills | Status: DC
Start: 2020-04-08 — End: 2020-04-09

## 2020-04-08 NOTE — Telephone Encounter (Signed)
Katie w/paramedicine called to report pts bp has been running 170s/100s. Per Janett Billow [place],FNP increase hydralazine to 50mg  tid and keep pharmacy appt. Katie aware.

## 2020-04-09 ENCOUNTER — Other Ambulatory Visit (HOSPITAL_COMMUNITY): Payer: Self-pay

## 2020-04-09 ENCOUNTER — Ambulatory Visit (HOSPITAL_COMMUNITY)
Admission: RE | Admit: 2020-04-09 | Discharge: 2020-04-09 | Disposition: A | Discharge status: Home / Self Care | Payer: Medicare Other | Source: Ambulatory Visit | Attending: Cardiology | Admitting: Cardiology

## 2020-04-09 ENCOUNTER — Other Ambulatory Visit: Payer: Self-pay

## 2020-04-09 VITALS — BP 172/102 | HR 72 | Wt 240.0 lb

## 2020-04-09 DIAGNOSIS — E1122 Type 2 diabetes mellitus with diabetic chronic kidney disease: Secondary | ICD-10-CM | POA: Insufficient documentation

## 2020-04-09 DIAGNOSIS — Z9114 Patient's other noncompliance with medication regimen: Secondary | ICD-10-CM | POA: Diagnosis not present

## 2020-04-09 DIAGNOSIS — I428 Other cardiomyopathies: Secondary | ICD-10-CM | POA: Diagnosis not present

## 2020-04-09 DIAGNOSIS — Z86711 Personal history of pulmonary embolism: Secondary | ICD-10-CM | POA: Diagnosis not present

## 2020-04-09 DIAGNOSIS — F1010 Alcohol abuse, uncomplicated: Secondary | ICD-10-CM | POA: Diagnosis not present

## 2020-04-09 DIAGNOSIS — I5022 Chronic systolic (congestive) heart failure: Secondary | ICD-10-CM | POA: Diagnosis present

## 2020-04-09 DIAGNOSIS — Z7901 Long term (current) use of anticoagulants: Secondary | ICD-10-CM | POA: Diagnosis not present

## 2020-04-09 DIAGNOSIS — Z86718 Personal history of other venous thrombosis and embolism: Secondary | ICD-10-CM | POA: Insufficient documentation

## 2020-04-09 DIAGNOSIS — Z95810 Presence of automatic (implantable) cardiac defibrillator: Secondary | ICD-10-CM | POA: Diagnosis not present

## 2020-04-09 DIAGNOSIS — E785 Hyperlipidemia, unspecified: Secondary | ICD-10-CM | POA: Diagnosis not present

## 2020-04-09 DIAGNOSIS — N183 Chronic kidney disease, stage 3 unspecified: Secondary | ICD-10-CM | POA: Diagnosis not present

## 2020-04-09 DIAGNOSIS — I4891 Unspecified atrial fibrillation: Secondary | ICD-10-CM | POA: Diagnosis not present

## 2020-04-09 DIAGNOSIS — I130 Hypertensive heart and chronic kidney disease with heart failure and stage 1 through stage 4 chronic kidney disease, or unspecified chronic kidney disease: Secondary | ICD-10-CM | POA: Diagnosis not present

## 2020-04-09 MED ORDER — HYDRALAZINE HCL 50 MG PO TABS
75.0000 mg | ORAL_TABLET | Freq: Three times a day (TID) | ORAL | 11 refills | Status: DC
Start: 2020-04-09 — End: 2020-04-16

## 2020-04-09 MED ORDER — ISOSORBIDE MONONITRATE ER 60 MG PO TB24
60.0000 mg | ORAL_TABLET | Freq: Every day | ORAL | 11 refills | Status: DC
Start: 2020-04-09 — End: 2020-10-07

## 2020-04-09 NOTE — Progress Notes (Signed)
Came out this afternoon for med rec post pharmacy clinic visit for the med changes.  The isosorbide was increased to 60mg  and hydralazine increased to 75mg  TID. Pharmacy will send out new rx next week.   Will see pt next week.  [name], EMT-Paramedic  04/09/20

## 2020-04-09 NOTE — Patient Instructions (Signed)
It was a pleasure seeing you today!  MEDICATIONS: -We are changing your medications today -Increase hydralazine to 75 mg (1.5 tablets) three times daily -Increase isosorbide mononitrate to 60 mg (1 tablet) daily -Call if you have questions about your medications.   NEXT APPOINTMENT: Return to clinic in 1 week with [facility].  In general, to take care of your heart failure: -Limit your fluid intake to 2 Liters (half-gallon) per day.   -Limit your salt intake to ideally 2-3 grams (2000-3000 mg) per day. -Weigh yourself daily and record, and bring that "weight diary" to your next appointment.  (Weight gain of 2-3 pounds in 1 day typically means fluid weight.) -The medications for your heart are to help your heart and help you live longer.   -Please contact us before stopping any of your heart medications.  Call the clinic at [phone] with questions or to reschedule future appointments.

## 2020-04-09 NOTE — Progress Notes (Signed)
Paramedicine Encounter   Patient ID: Troy Rush , male,   DOB: 1970-09-25,49 y.o.,  MRN: [id number]   Met patient in clinic today with provider.   Weight @ clinic-240 B/p-172/102 p-72  The other day his hydralazine was increased to 80m TID due to elevated b/p readings.  He has no complaints at this time.  No dizziness, no light headiness. No c/p, no palpitations.  Laying down at sleep he has sob.  Breathing during day is good unless its an incline, just at night time it bothers him.   -med changes today-hydralazine is going to be increased to 779mTID -isosorbide will be increased to 6026maily   I will go see him later today to make those changes.   KatMarylouise StacksMTWinnsboro [place]/30/2021

## 2020-04-14 ENCOUNTER — Other Ambulatory Visit (HOSPITAL_COMMUNITY): Payer: Self-pay

## 2020-04-14 NOTE — Progress Notes (Signed)
Paramedicine Encounter    Patient ID: Troy Rush, male    DOB: [birth year], 50 y.o.   MRN: [id number]   Patient Care Team: [name], FNP as PCP - General (Family Medicine) Larey Dresser, MD as PCP - Cardiology (Cardiology) Conrad , NP as Nurse Practitioner (Cardiology) Larey Dresser, MD as Consulting Physician (Cardiology)  Patient Active Problem List   Diagnosis Date Noted  . Pulmonary embolism ([place]) 02/05/2020  . Acute kidney injury superimposed on CKD ([place])   . Chronic combined systolic and diastolic heart failure ([place])   . Atrial fibrillation with RVR ([place]) 02/01/2020  . NSVT (nonsustained ventricular tachycardia) ([place]) 02/01/2020  . Acute GI bleeding 02/01/2020  . DKA (diabetic ketoacidosis) ([place]) 02/01/2020  . Bilateral deafness 08/15/2019  . Type 2 diabetes mellitus with complication, with long-term current use of insulin ([place]) 08/15/2019  . Hemoglobin A1C between 7% and 9% indicating borderline diabetic control 08/15/2019  . Insomnia 08/15/2019  . Abdominal pain 11/14/2018  . Ischemic cardiomyopathy 10/14/2016  . NICM (nonischemic cardiomyopathy) ([place]) 09/15/2016  . Chronic systolic CHF (congestive heart failure) ([place]) 05/30/2016  . HTN (hypertension) 05/30/2016  . Deaf 05/30/2016  . Hyperkalemia 05/30/2016  . Diabetes ([place]) 03/21/2016  . Major depressive disorder, recurrent episode ([place]) 03/21/2016    Current Outpatient Medications:  .  amiodarone (PACERONE) 200 MG tablet, Take 0.5 tablets (100 mg total) by mouth daily., Disp: 90 tablet, Rfl: 1 .  apixaban (ELIQUIS) 5 MG TABS tablet, Take 1 tablet (5 mg total) by mouth 2 (two) times daily., Disp: 60 tablet, Rfl: 3 .  atorvastatin (LIPITOR) 80 MG tablet, Take 1 tablet (80 mg total) by mouth at bedtime., Disp: 30 tablet, Rfl: 11 .  carvedilol (COREG) 25 MG tablet, Take 1 tablet (25 mg total) by mouth 2 (two) times daily with a meal., Disp: 60 tablet, Rfl: 0 .  fenofibrate 54 MG tablet, Take 1 tablet  (54 mg total) by mouth daily., Disp: 30 tablet, Rfl: 2 .  furosemide (LASIX) 40 MG tablet, Take 1 tablet (40 mg total) by mouth in the morning., Disp: 60 tablet, Rfl: 11 .  hydrALAZINE (APRESOLINE) 50 MG tablet, Take 1.5 tablets (75 mg total) by mouth 3 (three) times daily., Disp: 135 tablet, Rfl: 11 .  icosapent Ethyl (VASCEPA) 1 g capsule, Take 2 capsules (2 g total) by mouth 2 (two) times daily., Disp: 120 capsule, Rfl: 5 .  insulin glargine (LANTUS) 100 UNIT/ML injection, Inject 0.24 mLs (24 Units total) into the skin daily., Disp: 10 mL, Rfl: 11 .  isosorbide mononitrate (IMDUR) 60 MG 24 hr tablet, Take 1 tablet (60 mg total) by mouth daily., Disp: 30 tablet, Rfl: 11 .  Lancets MISC, 1 each by Does not apply route 3 (three) times daily as needed., Disp: 300 each, Rfl: 3 .  latanoprost (XALATAN) 0.005 % ophthalmic solution, Place 1 drop into both eyes at bedtime., Disp: , Rfl:  .  pantoprazole (PROTONIX) 40 MG tablet, Take 1 tablet (40 mg total) by mouth 2 (two) times daily., Disp: 180 tablet, Rfl: 3 .  Vitamin D, Ergocalciferol, (DRISDOL) 1.25 MG (50000 UNIT) CAPS capsule, Take 1 capsule (50,000 Units total) by mouth every 7 (seven) days., Disp: 5 capsule, Rfl: 6 Allergies  Allergen Reactions  . Lisinopril Cough      Social History   Socioeconomic History  . Marital status: Legally Separated    Spouse name: Not on file  . Number of children: Not on file  .  Years of education: Not on file  . Highest education level: Not on file  Occupational History  . Not on file  Tobacco Use  . Smoking status: Never Smoker  . Smokeless tobacco: Never Used  Vaping Use  . Vaping Use: Never used  Substance and Sexual Activity  . Alcohol use: Yes    Alcohol/week: 1.0 - 2.0 standard drink    Types: 1 - 2 Glasses of wine per week    Comment: rare  . Drug use: No  . Sexual activity: Yes  Other Topics Concern  . Not on file  Social History Narrative  . Not on file   Social Determinants of  Health   Financial Resource Strain: Medium Risk  . Difficulty of Paying Living Expenses: Somewhat hard  Food Insecurity: No Food Insecurity  . Worried About Charity fundraiser in the Last Year: Never true  . Ran Out of Food in the Last Year: Never true  Transportation Needs: No Transportation Needs  . Lack of Transportation (Medical): No  . Lack of Transportation (Non-Medical): No  Physical Activity: Not on file  Stress: Not on file  Social Connections: Not on file  Intimate Partner Violence: Not on file    Physical Exam Exam conducted with a chaperone present.         Future Appointments  Date Time Provider [place]  04/16/2020  9:30 AM [facility] PA/NP [facility] None  04/17/2020 10:45 AM [facility]  05/15/2020  4:00 PM Deboraha Sprang, MD [facility]  06/09/2020 10:20 AM [name], FNP [facility] None  06/22/2020  8:35 AM [facility] DEVICE REMOTES [facility]  09/21/2020  8:35 AM [facility] DEVICE REMOTES [facility]  12/21/2020  8:35 AM [facility] DEVICE REMOTES [facility]  03/22/2021  8:35 AM [facility] DEVICE REMOTES [facility]  06/21/2021  8:35 AM [facility] DEVICE REMOTES [facility]  09/20/2021  8:35 AM [facility] DEVICE REMOTES [facility]  12/20/2021  8:35 AM [facility] DEVICE REMOTES [facility]    BP 138/80   Pulse 76   Temp 98.4 F (36.9 C)   Resp 18   Wt 238 lb (108 kg)   SpO2 97%   BMI 35.15 kg/m   Weight yesterday-? Last visit weight-240 @ clinic  Pt reports he is doing well. He denies dizziness, no c/p, no sob, no edema noted. His b/p readings when he takes them are still elevated however today it was down to 138/80 so that is much better. My manual pressure matched his auto cuff one. No missed doses of his meds this week.  meds verified and pill box refilled.    meds needed--hydralazine 75mg  dose,  carvedilol, isosorbide 60mg  --has isosorbide for 2 days only  (wed/thurs) and vitamin d   Called pharmacy to order those meds. I will p/u and bring to his clinic visit on Thursday.   [name], [place] [facility] Paramedic  04/14/20

## 2020-04-15 ENCOUNTER — Other Ambulatory Visit (HOSPITAL_COMMUNITY): Payer: Self-pay | Admitting: *Deleted

## 2020-04-15 MED ORDER — CARVEDILOL 25 MG PO TABS
25.0000 mg | ORAL_TABLET | Freq: Two times a day (BID) | ORAL | 6 refills | Status: DC
Start: 2020-04-15 — End: 2020-09-11

## 2020-04-16 ENCOUNTER — Other Ambulatory Visit (HOSPITAL_COMMUNITY): Payer: Self-pay

## 2020-04-16 ENCOUNTER — Ambulatory Visit (HOSPITAL_COMMUNITY)
Admission: RE | Admit: 2020-04-16 | Discharge: 2020-04-16 | Disposition: A | Discharge status: Home / Self Care | Payer: Medicare Other | Source: Ambulatory Visit | Attending: Cardiology | Admitting: Cardiology

## 2020-04-16 ENCOUNTER — Encounter (HOSPITAL_COMMUNITY): Payer: Self-pay

## 2020-04-16 ENCOUNTER — Other Ambulatory Visit: Payer: Self-pay

## 2020-04-16 VITALS — BP 170/100 | HR 79 | Wt 240.4 lb

## 2020-04-16 DIAGNOSIS — Z79899 Other long term (current) drug therapy: Secondary | ICD-10-CM | POA: Diagnosis not present

## 2020-04-16 DIAGNOSIS — I5022 Chronic systolic (congestive) heart failure: Secondary | ICD-10-CM | POA: Insufficient documentation

## 2020-04-16 DIAGNOSIS — N183 Chronic kidney disease, stage 3 unspecified: Secondary | ICD-10-CM | POA: Insufficient documentation

## 2020-04-16 DIAGNOSIS — Z7901 Long term (current) use of anticoagulants: Secondary | ICD-10-CM | POA: Insufficient documentation

## 2020-04-16 DIAGNOSIS — I480 Paroxysmal atrial fibrillation: Secondary | ICD-10-CM | POA: Diagnosis not present

## 2020-04-16 DIAGNOSIS — H9193 Unspecified hearing loss, bilateral: Secondary | ICD-10-CM

## 2020-04-16 DIAGNOSIS — Z91199 Patient's noncompliance with other medical treatment and regimen due to unspecified reason: Secondary | ICD-10-CM

## 2020-04-16 DIAGNOSIS — D6869 Other thrombophilia: Secondary | ICD-10-CM

## 2020-04-16 DIAGNOSIS — Z9119 Patient's noncompliance with other medical treatment and regimen: Secondary | ICD-10-CM

## 2020-04-16 DIAGNOSIS — Z95810 Presence of automatic (implantable) cardiac defibrillator: Secondary | ICD-10-CM | POA: Insufficient documentation

## 2020-04-16 DIAGNOSIS — Z86718 Personal history of other venous thrombosis and embolism: Secondary | ICD-10-CM | POA: Insufficient documentation

## 2020-04-16 DIAGNOSIS — Z794 Long term (current) use of insulin: Secondary | ICD-10-CM | POA: Diagnosis not present

## 2020-04-16 DIAGNOSIS — E785 Hyperlipidemia, unspecified: Secondary | ICD-10-CM

## 2020-04-16 DIAGNOSIS — Z9114 Patient's other noncompliance with medication regimen: Secondary | ICD-10-CM | POA: Insufficient documentation

## 2020-04-16 DIAGNOSIS — I428 Other cardiomyopathies: Secondary | ICD-10-CM | POA: Insufficient documentation

## 2020-04-16 DIAGNOSIS — Z86711 Personal history of pulmonary embolism: Secondary | ICD-10-CM | POA: Diagnosis not present

## 2020-04-16 DIAGNOSIS — H9190 Unspecified hearing loss, unspecified ear: Secondary | ICD-10-CM | POA: Insufficient documentation

## 2020-04-16 DIAGNOSIS — N1832 Chronic kidney disease, stage 3b: Secondary | ICD-10-CM

## 2020-04-16 DIAGNOSIS — I5042 Chronic combined systolic (congestive) and diastolic (congestive) heart failure: Secondary | ICD-10-CM | POA: Diagnosis present

## 2020-04-16 DIAGNOSIS — I10 Essential (primary) hypertension: Secondary | ICD-10-CM

## 2020-04-16 DIAGNOSIS — E1122 Type 2 diabetes mellitus with diabetic chronic kidney disease: Secondary | ICD-10-CM | POA: Insufficient documentation

## 2020-04-16 DIAGNOSIS — R0683 Snoring: Secondary | ICD-10-CM

## 2020-04-16 DIAGNOSIS — F1010 Alcohol abuse, uncomplicated: Secondary | ICD-10-CM

## 2020-04-16 DIAGNOSIS — I130 Hypertensive heart and chronic kidney disease with heart failure and stage 1 through stage 4 chronic kidney disease, or unspecified chronic kidney disease: Secondary | ICD-10-CM | POA: Diagnosis not present

## 2020-04-16 DIAGNOSIS — E118 Type 2 diabetes mellitus with unspecified complications: Secondary | ICD-10-CM | POA: Diagnosis not present

## 2020-04-16 LAB — BASIC METABOLIC PANEL
Anion gap: 10 (ref 5–15)
BUN: 36 mg/dL — ABNORMAL HIGH (ref 6–20)
CO2: 23 mmol/L (ref 22–32)
Calcium: 9.3 mg/dL (ref 8.9–10.3)
Chloride: 107 mmol/L (ref 98–111)
Creatinine, Ser: 1.8 mg/dL — ABNORMAL HIGH (ref 0.61–1.24)
GFR, Estimated: 45 mL/min — ABNORMAL LOW (ref >60)
Glucose, Bld: 114 mg/dL — ABNORMAL HIGH (ref 70–99)
Potassium: 5 mmol/L (ref 3.5–5.1)
Sodium: 140 mmol/L (ref 135–145)

## 2020-04-16 LAB — TSH: TSH: 8.426 u[IU]/mL — ABNORMAL HIGH (ref 0.350–4.500)

## 2020-04-16 MED ORDER — DAPAGLIFLOZIN PROPANEDIOL 10 MG PO TABS: 10.0000 mg | ORAL_TABLET | Freq: Every day | ORAL | 11 refills | Status: DC

## 2020-04-16 MED ORDER — HYDRALAZINE HCL 100 MG PO TABS: 100.0000 mg | ORAL_TABLET | Freq: Three times a day (TID) | ORAL | 3 refills | Status: DC

## 2020-04-16 NOTE — Progress Notes (Signed)
Patient Name: Jacolby Risby        DOB: 1970-05-12      Height: 5'9"    Weight:240 lb  Office Name: [facility]          Referring Provider:  Today's Date:04/16/2020  Date:   STOP BANG RISK ASSESSMENT S (snore) Have you been told that you snore?     YES   T (tired) Are you often tired, fatigued, or sleepy during the day?   YES  O (obstruction) Do you stop breathing, choke, or gasp during sleep? YES   P (pressure) Do you have or are you being treated for high blood pressure? YES   B (BMI) Is your body index greater than 35 kg/m? YES   A (age) Are you 52 years old or older? YES   N (neck) Do you have a neck circumference greater than 16 inches?   NO   G (gender) Are you a male? YES   TOTAL STOP/BANG "YES" ANSWERS                                                                        For Office Use Only              Procedure Order Form    YES to 3+ Stop Bang questions OR two clinical symptoms - patient qualifies for WatchPAT (CPT 95800)     Submit: This Form + Patient Face Sheet + Clinical Note via CloudPAT or Fax: [phone]         Clinical Notes: Will consult Sleep Specialist and refer for management of therapy due to patient increased risk of Sleep Apnea. Ordering a sleep study due to the following two clinical symptoms:/ Loud snoring R06.83 / Unrefreshed by sleep G47.8 History of high blood pressure R03.0    I understand that I am proceeding with a home sleep apnea test as ordered by my treating physician. I understand that untreated sleep apnea is a serious cardiovascular risk factor and it is my responsibility to perform the test and seek management for sleep apnea. I will be contacted with the results and be managed for sleep apnea by a local sleep physician. I will be receiving equipment and further instructions from [facility]. I shall promptly ship back the equipment via the included mailing label. I understand my insurance will be billed for the  test and as the patient I am responsible for any insurance related out-of-pocket costs incurred. I have been provided with written instructions and can call for additional video or telephonic instruction, with 24-hour availability of qualified personnel to answer any questions: Patient Help Desk [phone].  Patient Signature ______________________________________________________   Date______________________ Patient Telemedicine Verbal Consent    u

## 2020-04-16 NOTE — Progress Notes (Signed)
Advanced Heart Failure Clinic Note   PCP: Kathe Becton, FNP HF Cardiology: Dr. Aundra Dubin  50 y.o. with deafness, HTN, DM, and chronic systolic CHF presents for followup of CHF.  Patient had no known cardiac problems until 10/16.  At that time, he developed exertional dyspnea and was admitted to a hospital in [place], [place] with acute systolic CHF.  Echo 10/16 showed EF 15-20%.  Cardiac cath showed no significant coronary disease.  He was diuresed and started on cardiac meds. Subsequently, he moved to [facility].  Now working in a [employer].  Repeat echo in 5/18 showed persistently low EF, 25-30%.  He had Leonard placed.   He was admitted briefly with CHF exacerbation in 8/20.   Echo in 9/20 showed EF 30-35% with diffuse hypokinesis.   Ultrasound in 4/21 showed right leg DVT and CT showed a PE.  No obvious trigger (no long trip, no surgery, no prolonged immobility).  He was started on apixaban.   He quit taking his meds in 8/21 and was drinking heavily due to depression, later restarted meds.  In 10/21, he was admitted with DKA and atrial fibrillation/RVR (1st known episode).  He converted to NSR spontaneously.  [name] was stopped due to AKI.  He had melena/hematochezia and EGD showed gastritis/esophagitis.  Apixaban was stopped.  He went home but then was re-admitted later in 10/21 with bilateral PEs.   Echo in 10/21 showed EF 45%, mild global hypokinesis, moderate LVH, mildly decreased RV systolic function.   OV on 11/4 and was found to be fluid overloaded. Entresto was restarted at 24-26 mg bid and Lasix increased to 40 mg bid.  Follow up on 02/20/20 fluid status better, no medication changes made due to soft BP.  Today he returns for HF follow up. Overall feeling fine. Gets SOB with walking upstairs. Denies increasing SOB, CP, dizziness, edema, or PND/Orthopnea. Sometimes needs to sleep on extra pillows. Appetite ok. No fever or chills. Weight at home ~238 pounds. Taking  all medications. Drinking ETOH intermittently, says he has not drank ETOH at all this past week. SBP at home 180-190s.   Device Interrogation: thoracic impedence near threshold, patient activity ~1.5-2 hours/day (personally reviewed).  ECG today: NSR 80 bpm, Qtc 493 (personally reviewed).  Labs (9/17): BNP 477, K 4.5 => 4.2, creatinine 0.92 => 1.1, BNP 1009, HIV negative, SPEP negative.  Labs (10/17): K 5.2, creatinine 1.10, hgb 12.5 Labs (03/28/2016): K 5.3 Creatinine 1.07  Labs (3/18): digoxin < 0.2, K 4.1, creatinine 0.97 Labs (9/18): hgb 12.2, K 4, creatinine 0.99, BNP 139 Labs (1/19): hgb 11.2, K 4.8, creatinine 1.26 Labs (8/20): K 3.7, creatinine 1.69 Labs (5/21): K 5, creatinine 1.4, LDL 192 Labs (10/21): K 4.1, creatinine 1.77, LDL 180, TGs 138 Labs (11/21): K 4.5, creatinine 2.68 Labs (12/21): K 5.1, creatinine 2.25  PMH: 1. HTN 2. Diabetes 3. Hyperlipidemia 4. Deafness since age 2: Needs sign language interpreter.  5. Chronic systolic CHF: Nonischemic cardiomyopathy.  Diagnosis 10/16 in [place], [place] (admitted with acute systolic CHF).  SPEP and HIV negative.  [place].  - Echo (10/16): EF 15-20%, mild LVH, mild MR, normal RV size and systolic function.  - LHC (10/16): No significant CAD.  - CPX (12/17): Mild functional limitation.  - 04/2016 CMRI: EF 27%. RV normal. No evidence of infiltrative disease, myocarditis, or MI.  - Echo (5/18): EF 25-30%, mild LV dilation, mild LVH, normal RV size with mildly decreased - Echo (9/20): EF 30-35%, mild LVH,  diffuse hypokinesis, normal RV size and systolic function.  - Echo (10/21): EF 45%, mild global hypokinesis, moderate LVH, mildly decreased RV systolic function.  6. Depression. 7. Colitis episodes.  8. CKD stage 3.  9. DVT/PE in 4/21: No obvious trigger.  - Recurrent PE in 10/21, had been off anticoagulation.  10. Atrial fibrillation: Paroxysmal.  11. Gastritis/esophagitis: 10/21.  12. ETOH abuse  SH: From  [place] originally.  Lives in [place] until 2017, then moved to [place].  Nonsmoker.  Unemployed.  H/o ETOH abuse.    FH: No known cardiac disease.   ROS: All systems reviewed and negative except as per HPI.   Current Outpatient Medications  Medication Sig Dispense Refill  . amiodarone (PACERONE) 200 MG tablet Take 0.5 tablets (100 mg total) by mouth daily. 90 tablet 1  . apixaban (ELIQUIS) 5 MG TABS tablet Take 1 tablet (5 mg total) by mouth 2 (two) times daily. 60 tablet 3  . atorvastatin (LIPITOR) 80 MG tablet Take 1 tablet (80 mg total) by mouth at bedtime. 30 tablet 11  . carvedilol (COREG) 25 MG tablet Take 1 tablet (25 mg total) by mouth 2 (two) times daily with a meal. 60 tablet 6  . dapagliflozin propanediol (FARXIGA) 10 MG TABS tablet Take 1 tablet (10 mg total) by mouth daily before breakfast. 30 tablet 11  . fenofibrate 54 MG tablet Take 1 tablet (54 mg total) by mouth daily. 30 tablet 2  . furosemide (LASIX) 40 MG tablet Take 1 tablet (40 mg total) by mouth in the morning. 60 tablet 11  . icosapent Ethyl (VASCEPA) 1 g capsule Take 2 capsules (2 g total) by mouth 2 (two) times daily. 120 capsule 5  . insulin glargine (LANTUS) 100 UNIT/ML injection Inject 0.24 mLs (24 Units total) into the skin daily. 10 mL 11  . isosorbide mononitrate (IMDUR) 60 MG 24 hr tablet Take 1 tablet (60 mg total) by mouth daily. 30 tablet 11  . Lancets MISC 1 each by Does not apply route 3 (three) times daily as needed. 300 each 3  . latanoprost (XALATAN) 0.005 % ophthalmic solution Place 1 drop into both eyes at bedtime.    . pantoprazole (PROTONIX) 40 MG tablet Take 1 tablet (40 mg total) by mouth 2 (two) times daily. 180 tablet 3  . Vitamin D, Ergocalciferol, (DRISDOL) 1.25 MG (50000 UNIT) CAPS capsule Take 1 capsule (50,000 Units total) by mouth every 7 (seven) days. 5 capsule 6  . hydrALAZINE (APRESOLINE) 100 MG tablet Take 1 tablet (100 mg total) by mouth 3 (three) times daily. 90 tablet 3   No  current facility-administered medications for this encounter.   BP (!) 170/100   Pulse 79   Wt 109 kg (240 lb 6.4 oz)   SpO2 98%   BMI 35.50 kg/m    Wt Readings from Last 3 Encounters:  04/16/20 109 kg (240 lb 6.4 oz)  04/14/20 108 kg (238 lb)  04/09/20 108.9 kg (240 lb)    PHYSICAL EXAM: ReDS Clip 35%  General:  Well appearing. No respiratory difficulty HEENT: deaf, otherwise normal Neck: supple. no JVD. Carotids 2+ bilat; no bruits. No lymphadenopathy or thyromegaly appreciated. Cor: PMI nondisplaced. Regular rate & rhythm. No rubs, gallops or murmurs. Lungs: clear Abdomen: obese, soft, nontender, nondistended. No hepatosplenomegaly. No bruits or masses. Good bowel sounds. Extremities: no cyanosis, clubbing, rash, trace bilateral lower extremity edema Neuro: alert & oriented x 3, cranial nerves grossly intact. moves all 4 extremities w/o difficulty. Affect pleasant.  Assessment/Plan: 1. Chronic systolic CHF: Nonischemic cardiomyopathy, etiology uncertain. Consider prior ETOH or HTN.  He has a [place].  cMRI in 1/18 with EF 27%, RV normal, no evidence of infiltrative disease, myocarditis, or MI.  Echo in 9/20 showed EF 30-35%.  Echo in 10/21 showed EF up to 45%. Volume status stable, although weight up 12 lbs since OV 11/21, and ReDs clip normal at 35%. - Increase hydralazine to 100 mg tid, continue Imdur 60 daily. - Start Farxiga 10 mg daily. Hgb a1c 7.9 12/21 - Continue Entresto 24-26 mg bid. Will hold off on titration with elevated Scr. - Continue Lasix to 40 mg daily.  - Continue Coreg 25 mg bid.      - He is off spironolactone for now with prior hyperkalemia.  May consider rechallenge in future with use of Lokelma or Veltasssa.  - Check BMET today; repeat in 7-10 days.  - We discussed recommendations for the management of chronic heart failure to control volume/symptoms and reduce risk of acute exacerbation that may necessitate hospitalization.  These measures  include continuation of current diuretics with close outpatient monitoring of volume status through daily weights.  Patient advised to check weight daily and to call our office if greater than 3 pound weight gain in 24 hours or greater than 5 pound weight gain in the course of 1 week. Patient also strongly encouraged to adhere to a low salt diet, reducing intake to less than 2 g daily.  2. Type II diabetes: management per PCP. Continue Lantus. - Start Wilder Glade.  3. HTN: has been elevated for past month, per patient and Katie w/ paramedicine.  - Increase hydralazine to 100 mg TID+ Imdur 60 mg daily. - Check BMET.  4. Deafness: Sign language interpreter present at visit.   5. CKD: Stage 3.  Last SCr 2.68>>2.25. Check BMET today.   6. PE/DVT: 4/21.  Unclear cause, no prolonged trip, prolonged immobility, or history of malignancy. No FH clotting. Recurrent PE in 10/21 while Eliquis on hold with bleeding from esophagitis/gastritis.  Do no think this was an Eliquis failure.  - Continue apixaban, think he will need long-term with no clear trigger for VTE. No bleeding issues.   7. Hyperlipidemia: LDL very high when last checked, not clear that he had been taking statin.  Goal LDL < 70 with diabetes.  - continue atorvastatin 80 mg daily (paramedicine now helping w/ med compliance)  - repeat FLP with next labs, if not at goal add Zetia +/- PCSK9i.  8. Atrial fibrillation: First noted in setting of DKA in 10/21.  He is now on amiodarone and is in NSR today.  - Continue amiodarone 100 mg daily (reduced 2/2 elevated TSH).  Recent TSH elevated,  T3 & T4 ok, repeat TSH today now that amio has been decreased. HFTs ok.  He will need a regular eye exam.  - Not ideal to continue amiodarone long-term, will likely stop in the future if no further AF and arrange for ablation if it recurs.  - Continue Eliquis.   9. ETOH abuse: Encouraged him to quit.  ETOH may play a role in both cardiomyopathy and atrial  fibrillation.    10. Snoring: Has had sleep study years ago in [place], patient thinks he was diagnosed with OSA. Paramedicine says he falls asleep during visits occasionally, and snores. - Schedule sleep study.  11. Noncompliance: Significant problems with noncompliance around medication use.   - Paramedicine now following, appreciate their assistance.  F/u in  APP clinic in 4-6 weeks.  [place], [place]  04/16/2020

## 2020-04-16 NOTE — Patient Instructions (Signed)
START Farxiga 10 mg, one tab daily INCREASE Hydralazine to 100mg , one tab three times per day  Labs today We will only contact you if something comes back abnormal or we need to make some changes. Otherwise no news is good news!  Labs needed in 7-10 days  Your physician has recommended that you have a sleep study. This test records several body functions during sleep, including: brain activity, eye movement, oxygen and carbon dioxide blood levels, heart rate and rhythm, breathing rate and rhythm, the flow of air through your mouth and nose, snoring, body muscle movements, and chest and belly movement.  Your physician recommends that you schedule a follow-up appointment in: 4-6 weeks  in the Advanced Practitioners (PA/NP) Clinic    If you have any questions or concerns before your next appointment please send [place] a message through [place] or call our office at [phone].    TO LEAVE A MESSAGE FOR THE NURSE SELECT OPTION 2, PLEASE LEAVE A MESSAGE INCLUDING: . YOUR NAME . DATE OF BIRTH . CALL BACK NUMBER . REASON FOR CALL**this is important as we prioritize the call backs  YOU WILL RECEIVE A CALL BACK THE SAME DAY AS LONG AS YOU CALL BEFORE 4:00 PM  Do the following things EVERYDAY: 1) Weigh yourself in the morning before breakfast. Write it down and keep it in a log. 2) Take your medicines as prescribed 3) Eat low salt foods-Limit salt (sodium) to 2000 mg per day.  4) Stay as active as you can everyday 5) Limit all fluids for the day to less than 2 liters

## 2020-04-16 NOTE — Progress Notes (Signed)
Paramedicine Encounter   Patient ID: Troy Rush , male,   DOB: [birth year],49 y.o.,  MRN: [id number]   Met patient in clinic today with provider.   Weight @ clinic-240 B/p-170/100 p-79 [id number]  Pt reports he feels so-so today. His b/p is up today again. Took meds around 7am.  Will see about a sleep study. He may have to do inpatient sleep study due to insurance.  He had one in [place] and they labeled him as sleep apneic but he has not had study in [place] today--will include labs for tomor.  Labs in 7-10 days  Will start farxiga today 4m  It was going to be started in November but his labs that day was elevated to prevent that from starting.   Hydralazine increased to 1058mTID.   Will go out there this evening to make those med changes and will see if pharmacy can get that med ready for him today and I will pick that up too and take this evening.   KaMarylouise StacksEMRush Center/09/2020

## 2020-04-17 ENCOUNTER — Other Ambulatory Visit: Payer: Medicare Other

## 2020-04-20 NOTE — Progress Notes (Signed)
Came out for med for med changes at clinic today and also place the meds we ran out of in pill box this week.    [name], EMT -Paramedic 04-16-2020

## 2020-04-21 ENCOUNTER — Other Ambulatory Visit (HOSPITAL_COMMUNITY): Payer: Self-pay

## 2020-04-21 NOTE — Progress Notes (Signed)
Went to pts home at scheduled time and no answer at door. No answer from previous texts to let him know I was on the way and no answer when I texted when I was waiting at door.  --he did finally text me back about 2 hrs later but I will have to follow up [place], EMT-Paramedic  04/21/20

## 2020-04-22 ENCOUNTER — Telehealth (HOSPITAL_COMMUNITY): Payer: Self-pay | Admitting: Licensed Clinical Social Worker

## 2020-04-22 NOTE — Telephone Encounter (Addendum)
CSW called [employer] to check on status of pt LIEAP application.  Informed by [facility] that they rejected pt application because it was submitted too early- apparently they only processed applications for patients 6 and up from December 1st-31st and then opened to the rest of the population starting in January.  CSW resent in application for review.  Pt also struggling to pay for his Internet bill again this month.  CSW attempting to find assistance options for this and has sent message to Keenan Bachelor who is a Proofreader with [employer] who reached out to pt to discuss options.  Since pt has already applied for [employer] program back in December she states this could also assist with Internet expenses so unable to assist further at this time.    Will continue to follow and assist as needed  Jorge Ny, [facility] Desk#: [phone] Cell#: [phone]

## 2020-04-23 ENCOUNTER — Other Ambulatory Visit (HOSPITAL_COMMUNITY): Payer: Self-pay

## 2020-04-23 NOTE — Progress Notes (Signed)
Paramedicine Encounter    Patient ID: Troy Rush, male    DOB: [birth year], 50 y.o.   MRN: [id number]   Patient Care Team: [name], FNP as PCP - General (Family Medicine) Larey Dresser, MD as PCP - Cardiology (Cardiology) Conrad Dupont, NP as Nurse Practitioner (Cardiology) Larey Dresser, MD as Consulting Physician (Cardiology)  Patient Active Problem List   Diagnosis Date Noted  . Pulmonary embolism ([place]) 02/05/2020  . Acute kidney injury superimposed on CKD ([place])   . Chronic combined systolic and diastolic heart failure ([place])   . Atrial fibrillation with RVR ([place]) 02/01/2020  . NSVT (nonsustained ventricular tachycardia) ([place]) 02/01/2020  . Acute GI bleeding 02/01/2020  . DKA (diabetic ketoacidosis) ([place]) 02/01/2020  . Bilateral deafness 08/15/2019  . Type 2 diabetes mellitus with complication, with long-term current use of insulin ([place]) 08/15/2019  . Hemoglobin A1C between 7% and 9% indicating borderline diabetic control 08/15/2019  . Insomnia 08/15/2019  . Abdominal pain 11/14/2018  . Ischemic cardiomyopathy 10/14/2016  . NICM (nonischemic cardiomyopathy) ([place]) 09/15/2016  . Chronic systolic CHF (congestive heart failure) ([place]) 05/30/2016  . HTN (hypertension) 05/30/2016  . Deaf 05/30/2016  . Hyperkalemia 05/30/2016  . Diabetes ([place]) 03/21/2016  . Major depressive disorder, recurrent episode ([place]) 03/21/2016    Current Outpatient Medications:  .  amiodarone (PACERONE) 200 MG tablet, Take 0.5 tablets (100 mg total) by mouth daily., Disp: 90 tablet, Rfl: 1 .  apixaban (ELIQUIS) 5 MG TABS tablet, Take 1 tablet (5 mg total) by mouth 2 (two) times daily., Disp: 60 tablet, Rfl: 3 .  atorvastatin (LIPITOR) 80 MG tablet, Take 1 tablet (80 mg total) by mouth at bedtime., Disp: 30 tablet, Rfl: 11 .  carvedilol (COREG) 25 MG tablet, Take 1 tablet (25 mg total) by mouth 2 (two) times daily with a meal., Disp: 60 tablet, Rfl: 6 .  dapagliflozin propanediol (FARXIGA) 10  MG TABS tablet, Take 1 tablet (10 mg total) by mouth daily before breakfast., Disp: 30 tablet, Rfl: 11 .  furosemide (LASIX) 40 MG tablet, Take 1 tablet (40 mg total) by mouth in the morning., Disp: 60 tablet, Rfl: 11 .  hydrALAZINE (APRESOLINE) 100 MG tablet, Take 1 tablet (100 mg total) by mouth 3 (three) times daily., Disp: 90 tablet, Rfl: 3 .  icosapent Ethyl (VASCEPA) 1 g capsule, Take 2 capsules (2 g total) by mouth 2 (two) times daily., Disp: 120 capsule, Rfl: 5 .  insulin glargine (LANTUS) 100 UNIT/ML injection, Inject 0.24 mLs (24 Units total) into the skin daily., Disp: 10 mL, Rfl: 11 .  isosorbide mononitrate (IMDUR) 60 MG 24 hr tablet, Take 1 tablet (60 mg total) by mouth daily., Disp: 30 tablet, Rfl: 11 .  Lancets MISC, 1 each by Does not apply route 3 (three) times daily as needed., Disp: 300 each, Rfl: 3 .  latanoprost (XALATAN) 0.005 % ophthalmic solution, Place 1 drop into both eyes at bedtime., Disp: , Rfl:  .  pantoprazole (PROTONIX) 40 MG tablet, Take 1 tablet (40 mg total) by mouth 2 (two) times daily., Disp: 180 tablet, Rfl: 3 .  Vitamin D, Ergocalciferol, (DRISDOL) 1.25 MG (50000 UNIT) CAPS capsule, Take 1 capsule (50,000 Units total) by mouth every 7 (seven) days., Disp: 5 capsule, Rfl: 6 .  fenofibrate 54 MG tablet, Take 1 tablet (54 mg total) by mouth daily., Disp: 30 tablet, Rfl: 2 Allergies  Allergen Reactions  . Lisinopril Cough      Social History   Socioeconomic  History  . Marital status: Legally Separated    Spouse name: Not on file  . Number of children: Not on file  . Years of education: Not on file  . Highest education level: Not on file  Occupational History  . Not on file  Tobacco Use  . Smoking status: Never Smoker  . Smokeless tobacco: Never Used  Vaping Use  . Vaping Use: Never used  Substance and Sexual Activity  . Alcohol use: Yes    Alcohol/week: 1.0 - 2.0 standard drink    Types: 1 - 2 Glasses of wine per week    Comment: rare  . Drug  use: No  . Sexual activity: Yes  Other Topics Concern  . Not on file  Social History Narrative  . Not on file   Social Determinants of Health   Financial Resource Strain: Medium Risk  . Difficulty of Paying Living Expenses: Somewhat hard  Food Insecurity: No Food Insecurity  . Worried About Charity fundraiser in the Last Year: Never true  . Ran Out of Food in the Last Year: Never true  Transportation Needs: No Transportation Needs  . Lack of Transportation (Medical): No  . Lack of Transportation (Non-Medical): No  Physical Activity: Not on file  Stress: Not on file  Social Connections: Not on file  Intimate Partner Violence: Not on file    Physical Exam      Future Appointments  Date Time Provider [place]  04/27/2020  1:45 PM [facility] LAB [facility] None  05/15/2020  4:00 PM Deboraha Sprang, MD [facility]  05/25/2020 10:00 AM [facility] PA/NP [facility] None  05/30/2020  8:00 PM Sueanne Margarita, MD [facility]  06/09/2020 10:20 AM [name], FNP [facility] None  06/22/2020  8:35 AM [facility] DEVICE REMOTES [facility]  09/21/2020  8:35 AM [facility] DEVICE REMOTES [facility]  12/21/2020  8:35 AM [facility] DEVICE REMOTES [facility]  03/22/2021  8:35 AM [facility] DEVICE REMOTES [facility]  06/21/2021  8:35 AM [facility] DEVICE REMOTES [facility]  09/20/2021  8:35 AM [facility] DEVICE REMOTES [facility]  12/20/2021  8:35 AM [facility] DEVICE REMOTES [facility]    BP (!) 180/100   Pulse 78   Temp (!) 97.5 F (36.4 C)   Resp 20   Wt 239 lb (108.4 kg)   SpO2 98%   BMI 35.29 kg/m   Weight yesterday-? Last visit weight-240   Pt reports dong ok, he states his b/p is high at times and then lower. Not sure if maybe he is eating something with salt to make it go up or maybe not resting prior to checking it-the time/date is not set on the bp machine and idk  how to do it-ive checked and couldn't figure it out-no manual available.   His b/p is high but he hadnt had his pills yet-he overslept the other day and didn't open door for me.  meds verified and pill box refilled.   He needs fenofibrate and vit d 2 refilled.   [name], [facility] Paramedic  04/23/20

## 2020-04-27 ENCOUNTER — Other Ambulatory Visit (HOSPITAL_COMMUNITY): Payer: Medicare Other

## 2020-04-28 DIAGNOSIS — H40023 Open angle with borderline findings, high risk, bilateral: Secondary | ICD-10-CM | POA: Diagnosis not present

## 2020-04-28 DIAGNOSIS — H40053 Ocular hypertension, bilateral: Secondary | ICD-10-CM | POA: Diagnosis not present

## 2020-04-28 DIAGNOSIS — E113293 Type 2 diabetes mellitus with mild nonproliferative diabetic retinopathy without macular edema, bilateral: Secondary | ICD-10-CM | POA: Diagnosis not present

## 2020-04-29 ENCOUNTER — Other Ambulatory Visit (HOSPITAL_COMMUNITY): Payer: Self-pay

## 2020-04-29 DIAGNOSIS — I5042 Chronic combined systolic (congestive) and diastolic (congestive) heart failure: Secondary | ICD-10-CM

## 2020-04-29 DIAGNOSIS — E781 Pure hyperglyceridemia: Secondary | ICD-10-CM

## 2020-04-29 MED ORDER — FENOFIBRATE 54 MG PO TABS
54.0000 mg | ORAL_TABLET | Freq: Every day | ORAL | 2 refills | Status: DC
Start: 2020-04-29 — End: 2020-07-06

## 2020-04-30 ENCOUNTER — Ambulatory Visit (HOSPITAL_COMMUNITY)
Admission: RE | Admit: 2020-04-30 | Discharge: 2020-04-30 | Disposition: A | Discharge status: Home / Self Care | Payer: Medicare Other | Source: Ambulatory Visit | Attending: Cardiology | Admitting: Cardiology

## 2020-04-30 ENCOUNTER — Other Ambulatory Visit (HOSPITAL_COMMUNITY): Payer: Self-pay

## 2020-04-30 ENCOUNTER — Encounter (HOSPITAL_COMMUNITY): Payer: Self-pay | Admitting: Cardiology

## 2020-04-30 ENCOUNTER — Other Ambulatory Visit: Payer: Self-pay

## 2020-04-30 DIAGNOSIS — I5042 Chronic combined systolic (congestive) and diastolic (congestive) heart failure: Secondary | ICD-10-CM | POA: Insufficient documentation

## 2020-04-30 DIAGNOSIS — E785 Hyperlipidemia, unspecified: Secondary | ICD-10-CM | POA: Diagnosis not present

## 2020-04-30 LAB — BASIC METABOLIC PANEL
Anion gap: 9 (ref 5–15)
BUN: 37 mg/dL — ABNORMAL HIGH (ref 6–20)
CO2: 26 mmol/L (ref 22–32)
Calcium: 9.2 mg/dL (ref 8.9–10.3)
Chloride: 103 mmol/L (ref 98–111)
Creatinine, Ser: 2.7 mg/dL — ABNORMAL HIGH (ref 0.61–1.24)
GFR, Estimated: 28 mL/min — ABNORMAL LOW (ref >60)
Glucose, Bld: 275 mg/dL — ABNORMAL HIGH (ref 70–99)
Potassium: 4.7 mmol/L (ref 3.5–5.1)
Sodium: 138 mmol/L (ref 135–145)

## 2020-04-30 LAB — LIPID PANEL
Cholesterol: 158 mg/dL (ref 0–200)
HDL: 38 mg/dL — ABNORMAL LOW (ref >40)
LDL Cholesterol: 76 mg/dL (ref 0–99)
Total CHOL/HDL Ratio: 4.2 RATIO
Triglycerides: 220 mg/dL — ABNORMAL HIGH (ref <150)
VLDL: 44 mg/dL — ABNORMAL HIGH (ref 0–40)

## 2020-04-30 NOTE — Progress Notes (Signed)
Paramedicine Encounter    Patient ID: Troy Rush, male    DOB: [birth year], 50 y.o.   MRN: [id number]   Patient Care Team: [name], FNP as PCP - General (Family Medicine) Larey Dresser, MD as PCP - Cardiology (Cardiology) Conrad [place], NP as Nurse Practitioner (Cardiology) Larey Dresser, MD as Consulting Physician (Cardiology)  Patient Active Problem List   Diagnosis Date Noted  . Pulmonary embolism ([place]) 02/05/2020  . Acute kidney injury superimposed on CKD ([place])   . Chronic combined systolic and diastolic heart failure ([place])   . Atrial fibrillation with RVR ([place]) 02/01/2020  . NSVT (nonsustained ventricular tachycardia) ([place]) 02/01/2020  . Acute GI bleeding 02/01/2020  . DKA (diabetic ketoacidosis) ([place]) 02/01/2020  . Bilateral deafness 08/15/2019  . Type 2 diabetes mellitus with complication, with long-term current use of insulin ([place]) 08/15/2019  . Hemoglobin A1C between 7% and 9% indicating borderline diabetic control 08/15/2019  . Insomnia 08/15/2019  . Abdominal pain 11/14/2018  . Ischemic cardiomyopathy 10/14/2016  . NICM (nonischemic cardiomyopathy) ([place]) 09/15/2016  . Chronic systolic CHF (congestive heart failure) ([place]) 05/30/2016  . HTN (hypertension) 05/30/2016  . Deaf 05/30/2016  . Hyperkalemia 05/30/2016  . Diabetes ([place]) 03/21/2016  . Major depressive disorder, recurrent episode ([place]) 03/21/2016    Current Outpatient Medications:  .  amiodarone (PACERONE) 200 MG tablet, Take 0.5 tablets (100 mg total) by mouth daily., Disp: 90 tablet, Rfl: 1 .  apixaban (ELIQUIS) 5 MG TABS tablet, Take 1 tablet (5 mg total) by mouth 2 (two) times daily., Disp: 60 tablet, Rfl: 3 .  atorvastatin (LIPITOR) 80 MG tablet, Take 1 tablet (80 mg total) by mouth at bedtime., Disp: 30 tablet, Rfl: 11 .  carvedilol (COREG) 25 MG tablet, Take 1 tablet (25 mg total) by mouth 2 (two) times daily with a meal., Disp: 60 tablet, Rfl: 6 .  dapagliflozin propanediol (FARXIGA) 10  MG TABS tablet, Take 1 tablet (10 mg total) by mouth daily before breakfast., Disp: 30 tablet, Rfl: 11 .  fenofibrate 54 MG tablet, Take 1 tablet (54 mg total) by mouth daily., Disp: 30 tablet, Rfl: 2 .  furosemide (LASIX) 40 MG tablet, Take 1 tablet (40 mg total) by mouth in the morning., Disp: 60 tablet, Rfl: 11 .  hydrALAZINE (APRESOLINE) 100 MG tablet, Take 1 tablet (100 mg total) by mouth 3 (three) times daily., Disp: 90 tablet, Rfl: 3 .  icosapent Ethyl (VASCEPA) 1 g capsule, Take 2 capsules (2 g total) by mouth 2 (two) times daily., Disp: 120 capsule, Rfl: 5 .  insulin glargine (LANTUS) 100 UNIT/ML injection, Inject 0.24 mLs (24 Units total) into the skin daily., Disp: 10 mL, Rfl: 11 .  isosorbide mononitrate (IMDUR) 60 MG 24 hr tablet, Take 1 tablet (60 mg total) by mouth daily., Disp: 30 tablet, Rfl: 11 .  Lancets MISC, 1 each by Does not apply route 3 (three) times daily as needed., Disp: 300 each, Rfl: 3 .  latanoprost (XALATAN) 0.005 % ophthalmic solution, Place 1 drop into both eyes at bedtime., Disp: , Rfl:  .  pantoprazole (PROTONIX) 40 MG tablet, Take 1 tablet (40 mg total) by mouth 2 (two) times daily., Disp: 180 tablet, Rfl: 3 .  Vitamin D, Ergocalciferol, (DRISDOL) 1.25 MG (50000 UNIT) CAPS capsule, Take 1 capsule (50,000 Units total) by mouth every 7 (seven) days., Disp: 5 capsule, Rfl: 6 Allergies  Allergen Reactions  . Lisinopril Cough      Social History   Socioeconomic  History  . Marital status: Legally Separated    Spouse name: Not on file  . Number of children: Not on file  . Years of education: Not on file  . Highest education level: Not on file  Occupational History  . Not on file  Tobacco Use  . Smoking status: Never Smoker  . Smokeless tobacco: Never Used  Vaping Use  . Vaping Use: Never used  Substance and Sexual Activity  . Alcohol use: Yes    Alcohol/week: 1.0 - 2.0 standard drink    Types: 1 - 2 Glasses of wine per week    Comment: rare  . Drug  use: No  . Sexual activity: Yes  Other Topics Concern  . Not on file  Social History Narrative  . Not on file   Social Determinants of Health   Financial Resource Strain: Medium Risk  . Difficulty of Paying Living Expenses: Somewhat hard  Food Insecurity: No Food Insecurity  . Worried About Charity fundraiser in the Last Year: Never true  . Ran Out of Food in the Last Year: Never true  Transportation Needs: No Transportation Needs  . Lack of Transportation (Medical): No  . Lack of Transportation (Non-Medical): No  Physical Activity: Not on file  Stress: Not on file  Social Connections: Not on file  Intimate Partner Violence: Not on file    Physical Exam      Future Appointments  Date Time Provider [place]  05/15/2020  4:00 PM Deboraha Sprang, MD [facility]  05/25/2020 10:00 AM [facility] PA/NP [facility] None  05/30/2020  8:00 PM Sueanne Margarita, MD [facility]  06/09/2020 10:20 AM [name], FNP [facility] None  06/22/2020  8:35 AM [facility] DEVICE REMOTES [facility]  09/21/2020  8:35 AM [facility] DEVICE REMOTES [facility]  12/21/2020  8:35 AM [facility] DEVICE REMOTES [facility]  03/22/2021  8:35 AM [facility] DEVICE REMOTES [facility]  06/21/2021  8:35 AM [facility] DEVICE REMOTES [facility]  09/20/2021  8:35 AM [facility] DEVICE REMOTES [facility]  12/20/2021  8:35 AM [facility] DEVICE REMOTES [facility]    BP (!) 170/100   Pulse 67   Temp 99.1 F (37.3 C)   Resp 18   Wt 240 lb (108.9 kg)   SpO2 98%   BMI 35.44 kg/m   Weight yesterday-? Last visit weight-239 @ clinic   Pt reports he is feeling good just tired this morning. He has nt taken meds yet this morning. We couldn't meet yesterday due to some stuff he had to do. He taking meds this morning during visit.  He denies dizziness, no sob, no c/p.  Pharmacy will have to deliver the  fenofibrate and he will have to place it in each of the morning slots in pill box. He is aware and capable of doing that.  Also ordered the vascepa for refill to be delivered as well.  Will see him next week.   [name], [facility] Paramedic  04/30/20

## 2020-05-05 ENCOUNTER — Other Ambulatory Visit: Payer: Self-pay

## 2020-05-05 ENCOUNTER — Telehealth (HOSPITAL_COMMUNITY): Payer: Self-pay

## 2020-05-05 ENCOUNTER — Emergency Department (HOSPITAL_COMMUNITY): Payer: Medicare Other

## 2020-05-05 ENCOUNTER — Emergency Department (HOSPITAL_COMMUNITY)
Admission: EM | Admit: 2020-05-05 | Discharge: 2020-05-06 | Disposition: A | Discharge status: Home / Self Care | Payer: Medicare Other | Attending: Emergency Medicine | Admitting: Emergency Medicine

## 2020-05-05 ENCOUNTER — Encounter (HOSPITAL_COMMUNITY): Payer: Self-pay | Admitting: Emergency Medicine

## 2020-05-05 DIAGNOSIS — E1169 Type 2 diabetes mellitus with other specified complication: Secondary | ICD-10-CM | POA: Diagnosis not present

## 2020-05-05 DIAGNOSIS — M25511 Pain in right shoulder: Secondary | ICD-10-CM | POA: Diagnosis not present

## 2020-05-05 DIAGNOSIS — Z794 Long term (current) use of insulin: Secondary | ICD-10-CM | POA: Insufficient documentation

## 2020-05-05 DIAGNOSIS — Z86711 Personal history of pulmonary embolism: Secondary | ICD-10-CM | POA: Diagnosis not present

## 2020-05-05 DIAGNOSIS — Z86718 Personal history of other venous thrombosis and embolism: Secondary | ICD-10-CM | POA: Diagnosis not present

## 2020-05-05 DIAGNOSIS — Z043 Encounter for examination and observation following other accident: Secondary | ICD-10-CM | POA: Diagnosis not present

## 2020-05-05 DIAGNOSIS — E1122 Type 2 diabetes mellitus with diabetic chronic kidney disease: Secondary | ICD-10-CM | POA: Insufficient documentation

## 2020-05-05 DIAGNOSIS — N189 Chronic kidney disease, unspecified: Secondary | ICD-10-CM | POA: Diagnosis not present

## 2020-05-05 DIAGNOSIS — E1110 Type 2 diabetes mellitus with ketoacidosis without coma: Secondary | ICD-10-CM | POA: Insufficient documentation

## 2020-05-05 DIAGNOSIS — Z95810 Presence of automatic (implantable) cardiac defibrillator: Secondary | ICD-10-CM | POA: Insufficient documentation

## 2020-05-05 DIAGNOSIS — I5042 Chronic combined systolic (congestive) and diastolic (congestive) heart failure: Secondary | ICD-10-CM | POA: Diagnosis not present

## 2020-05-05 DIAGNOSIS — R55 Syncope and collapse: Secondary | ICD-10-CM | POA: Diagnosis not present

## 2020-05-05 DIAGNOSIS — E785 Hyperlipidemia, unspecified: Secondary | ICD-10-CM | POA: Diagnosis not present

## 2020-05-05 DIAGNOSIS — Z79899 Other long term (current) drug therapy: Secondary | ICD-10-CM | POA: Diagnosis not present

## 2020-05-05 DIAGNOSIS — M25531 Pain in right wrist: Secondary | ICD-10-CM | POA: Insufficient documentation

## 2020-05-05 DIAGNOSIS — I130 Hypertensive heart and chronic kidney disease with heart failure and stage 1 through stage 4 chronic kidney disease, or unspecified chronic kidney disease: Secondary | ICD-10-CM | POA: Insufficient documentation

## 2020-05-05 DIAGNOSIS — R42 Dizziness and giddiness: Secondary | ICD-10-CM | POA: Diagnosis not present

## 2020-05-05 DIAGNOSIS — W19XXXA Unspecified fall, initial encounter: Secondary | ICD-10-CM | POA: Diagnosis not present

## 2020-05-05 DIAGNOSIS — Z7901 Long term (current) use of anticoagulants: Secondary | ICD-10-CM | POA: Insufficient documentation

## 2020-05-05 DIAGNOSIS — Z7984 Long term (current) use of oral hypoglycemic drugs: Secondary | ICD-10-CM | POA: Insufficient documentation

## 2020-05-05 DIAGNOSIS — I10 Essential (primary) hypertension: Secondary | ICD-10-CM | POA: Diagnosis not present

## 2020-05-05 DIAGNOSIS — R000 Tachycardia, unspecified: Secondary | ICD-10-CM | POA: Diagnosis not present

## 2020-05-05 LAB — URINALYSIS, ROUTINE W REFLEX MICROSCOPIC
Bacteria, UA: NONE SEEN
Bilirubin Urine: NEGATIVE
Glucose, UA: >=500 mg/dL — AB
Ketones, ur: 5 mg/dL — AB
Leukocytes,Ua: NEGATIVE
Nitrite: NEGATIVE
Protein, ur: >=300 mg/dL — AB
Specific Gravity, Urine: 1.02 (ref 1.005–1.030)
pH: 5 (ref 5.0–8.0)

## 2020-05-05 LAB — BASIC METABOLIC PANEL
Anion gap: 13 (ref 5–15)
BUN: 29 mg/dL — ABNORMAL HIGH (ref 6–20)
CO2: 21 mmol/L — ABNORMAL LOW (ref 22–32)
Calcium: 9 mg/dL (ref 8.9–10.3)
Chloride: 103 mmol/L (ref 98–111)
Creatinine, Ser: 2.15 mg/dL — ABNORMAL HIGH (ref 0.61–1.24)
GFR, Estimated: 36 mL/min — ABNORMAL LOW (ref >60)
Glucose, Bld: 80 mg/dL (ref 70–99)
Potassium: 4 mmol/L (ref 3.5–5.1)
Sodium: 137 mmol/L (ref 135–145)

## 2020-05-05 LAB — CBC
HCT: 39.5 % (ref 39.0–52.0)
Hemoglobin: 11.6 g/dL — ABNORMAL LOW (ref 13.0–17.0)
MCH: 27.9 pg (ref 26.0–34.0)
MCHC: 29.4 g/dL — ABNORMAL LOW (ref 30.0–36.0)
MCV: 95 fL (ref 80.0–100.0)
Platelets: 257 10*3/uL (ref 150–400)
RBC: 4.16 MIL/uL — ABNORMAL LOW (ref 4.22–5.81)
RDW: 12.5 % (ref 11.5–15.5)
WBC: 5.8 10*3/uL (ref 4.0–10.5)
nRBC: 0 % (ref 0.0–0.2)

## 2020-05-05 NOTE — ED Triage Notes (Signed)
Pt arrives via EMS- Pt has been having dizziness since yesterday and sweating. Pt is deaf and sent messages to [facility] (community health paramedic) that he was feeling this way. Pt has hx of CHF. Pt ending up falling 3 times. Pt had syncopal episode and near syncopal episodes. Pt complains of right shoulder wrist pain. Pt has full range of motion. No deformities. Neuro intact. Pt did not take home meds today due to not feeling well. BP 190/110. HR 70s sinus rhythm. CBG 132, 97% on room air. Dizziness worsens with standing. BP med has been changed several times over the past month. Denies any chest pain.

## 2020-05-06 DIAGNOSIS — R55 Syncope and collapse: Secondary | ICD-10-CM | POA: Diagnosis not present

## 2020-05-06 LAB — TROPONIN I (HIGH SENSITIVITY)
Troponin I (High Sensitivity): 28 ng/L — ABNORMAL HIGH (ref <18)
Troponin I (High Sensitivity): 31 ng/L — ABNORMAL HIGH (ref <18)

## 2020-05-06 MED ORDER — HYDRALAZINE HCL 50 MG PO TABS
100.0000 mg | ORAL_TABLET | Freq: Three times a day (TID) | ORAL | Status: DC
  Administered 2020-05-06: 100 mg via ORAL
  Filled 2020-05-06: qty 2

## 2020-05-06 MED ORDER — FUROSEMIDE 20 MG PO TABS
40.0000 mg | ORAL_TABLET | Freq: Every morning | ORAL | Status: DC
Start: 2020-05-06 — End: 2020-05-06
  Administered 2020-05-06: 40 mg via ORAL
  Filled 2020-05-06 (×2): qty 2

## 2020-05-06 MED ORDER — CARVEDILOL 3.125 MG PO TABS
25.0000 mg | ORAL_TABLET | Freq: Two times a day (BID) | ORAL | Status: DC
  Administered 2020-05-06: 25 mg via ORAL
  Filled 2020-05-06 (×2): qty 2

## 2020-05-06 MED ORDER — ISOSORBIDE MONONITRATE ER 30 MG PO TB24
60.0000 mg | ORAL_TABLET | Freq: Every day | ORAL | Status: DC
  Administered 2020-05-06: 60 mg via ORAL
  Filled 2020-05-06 (×2): qty 2

## 2020-05-06 NOTE — ED Notes (Signed)
Patient verbalizes understanding of discharge instructions. Opportunity for questioning and answers were provided. Armband removed by staff, pt discharged from ED.  

## 2020-05-06 NOTE — ED Notes (Signed)
Pt was provided with a sandwich and a diet gin

## 2020-05-06 NOTE — Progress Notes (Signed)
   Asked to help interpret pt device interrogation in setting of syncope.   No episodes to explain syncope; No episodes since last device interrogation. See below.   Legrand Como [address] [place], PA-C  05/06/2020 10:33 AM   .

## 2020-05-06 NOTE — Telephone Encounter (Signed)
Pt contacted me late in the evening to report he began to feel dizzy yesterday, had multiple falls today-one from bed and the other from toilet. He thinks he may have passed out, he isnt sure. He checked his CBG at home and it was 132 and his b/p was high at 188/100. He couldn't take his meds today due to the dizziness. He also reported blurry vision as well and that is new for him.  I asked him he needed me to get ambulance to him and he said yes. I got EMS unit out there to him and they did full assessment and took him to ER. Will f/u once he is d/c.   [name], EMT-Paramedic  05/06/20

## 2020-05-06 NOTE — ED Provider Notes (Signed)
[facility] EMERGENCY DEPARTMENT Provider Note   CSN: [id number] Arrival date & time: 05/05/20  1905     History Chief Complaint  Patient presents with  . Loss of Consciousness    GLYN BARRAZA is a 50 y.o. male.  50 year old male presents with dizziness that occurs when he stands up and has been associated with syncope and near syncope.  Patient did fall but did not strike his head.  Did note some right shoulder and right wrist pain.  States that he has been in his baseline state of health when this developed.  This happened 3 times.  Denies any associated chest pain or shortness of breath.  No recent changes to his medications.  Denies any history of volume loss.  No leg pain or discomfort.  Feels back to his baseline at this time.  No treatment use prior to arrival        Past Medical History:  Diagnosis Date  . AICD (automatic cardioverter/defibrillator) present 10/14/2016  . Bilateral deafness   . Bronchitis   . CHF (congestive heart failure) ([place])   . Depression   . Diabetes mellitus without complication ([place])   . DVT, lower extremity ([place]) 07/12/2019  . Dyslipidemia   . Headache 08/2019  . Hypertension   . Insomnia 08/2019  . NICM (nonischemic cardiomyopathy) ([place]) 09/15/2016  . Pancreatitis   . Vitamin D deficiency 08/2019    Patient Active Problem List   Diagnosis Date Noted  . Pulmonary embolism ([place]) 02/05/2020  . Acute kidney injury superimposed on CKD ([place])   . Chronic combined systolic and diastolic heart failure ([place])   . Atrial fibrillation with RVR ([place]) 02/01/2020  . NSVT (nonsustained ventricular tachycardia) ([place]) 02/01/2020  . Acute GI bleeding 02/01/2020  . DKA (diabetic ketoacidosis) ([place]) 02/01/2020  . Bilateral deafness 08/15/2019  . Type 2 diabetes mellitus with complication, with long-term current use of insulin ([place]) 08/15/2019  . Hemoglobin A1C between 7% and 9% indicating borderline diabetic control 08/15/2019  .  Insomnia 08/15/2019  . Abdominal pain 11/14/2018  . Ischemic cardiomyopathy 10/14/2016  . NICM (nonischemic cardiomyopathy) ([place]) 09/15/2016  . Chronic systolic CHF (congestive heart failure) ([place]) 05/30/2016  . HTN (hypertension) 05/30/2016  . Deaf 05/30/2016  . Hyperkalemia 05/30/2016  . Diabetes ([place]) 03/21/2016  . Major depressive disorder, recurrent episode ([place]) 03/21/2016    Past Surgical History:  Procedure Laterality Date  . BACK SURGERY    . CARDIAC CATHETERIZATION    . ESOPHAGOGASTRODUODENOSCOPY (EGD) WITH PROPOFOL N/A 02/02/2020   Procedure: ESOPHAGOGASTRODUODENOSCOPY (EGD) WITH PROPOFOL;  Surgeon: Arta Silence, MD;  Location: [facility];  Service: Endoscopy;  Laterality: N/A;  . ICD IMPLANT  10/14/2016  . ICD IMPLANT N/A 10/14/2016   Procedure: ICD Implant;  Surgeon: Deboraha Sprang, MD;  Location: [facility] CV LAB;  Service: Cardiovascular;  Laterality: N/A;  . WRIST SURGERY         Family History  Problem Relation Age of Onset  . Hypertension Mother   . Healthy Father   . Stroke Father     Social History   Tobacco Use  . Smoking status: Never Smoker  . Smokeless tobacco: Never Used  Vaping Use  . Vaping Use: Never used  Substance Use Topics  . Alcohol use: Yes    Alcohol/week: 1.0 - 2.0 standard drink    Types: 1 - 2 Glasses of wine per week    Comment: rare  . Drug use: No    Home Medications  Prior to Admission medications   Medication Sig Start Date End Date Taking? Authorizing Provider  amiodarone (PACERONE) 200 MG tablet Take 0.5 tablets (100 mg total) by mouth daily. 03/20/20   Baldwin Jamaica, PA-C  apixaban (ELIQUIS) 5 MG TABS tablet Take 1 tablet (5 mg total) by mouth 2 (two) times daily. 03/18/20   Larey Dresser, MD  atorvastatin (LIPITOR) 80 MG tablet Take 1 tablet (80 mg total) by mouth at bedtime. 03/18/20   Larey Dresser, MD  carvedilol (COREG) 25 MG tablet Take 1 tablet (25 mg total) by mouth 2 (two) times daily with a  meal. 04/15/20   Larey Dresser, MD  dapagliflozin propanediol (FARXIGA) 10 MG TABS tablet Take 1 tablet (10 mg total) by mouth daily before breakfast. 04/16/20   [place], Maricela Bo, FNP  fenofibrate 54 MG tablet Take 1 tablet (54 mg total) by mouth daily. 04/29/20 07/28/20  Rafael Bihari, FNP  furosemide (LASIX) 40 MG tablet Take 1 tablet (40 mg total) by mouth in the morning. 03/18/20   Larey Dresser, MD  hydrALAZINE (APRESOLINE) 100 MG tablet Take 1 tablet (100 mg total) by mouth 3 (three) times daily. 04/16/20   [place], Maricela Bo, FNP  icosapent Ethyl (VASCEPA) 1 g capsule Take 2 capsules (2 g total) by mouth 2 (two) times daily. 09/11/19   Larey Dresser, MD  insulin glargine (LANTUS) 100 UNIT/ML injection Inject 0.24 mLs (24 Units total) into the skin daily. 02/04/20   Raiford Noble Latif, DO  isosorbide mononitrate (IMDUR) 60 MG 24 hr tablet Take 1 tablet (60 mg total) by mouth daily. 04/09/20   Larey Dresser, MD  Lancets MISC 1 each by Does not apply route 3 (three) times daily as needed. 03/11/20   [name], FNP  latanoprost (XALATAN) 0.005 % ophthalmic solution Place 1 drop into both eyes at bedtime. 01/20/20   [provider]  pantoprazole (PROTONIX) 40 MG tablet Take 1 tablet (40 mg total) by mouth 2 (two) times daily. 03/25/20   [name], FNP  Vitamin D, Ergocalciferol, (DRISDOL) 1.25 MG (50000 UNIT) CAPS capsule Take 1 capsule (50,000 Units total) by mouth every 7 (seven) days. 08/19/19   [name], FNP    Allergies    Lisinopril  Review of Systems   Review of Systems  All other systems reviewed and are negative.   Physical Exam Updated Vital Signs BP (!) 213/102 (BP Location: Right Arm)   Pulse 68   Temp (!) 97.5 F (36.4 C) (Oral)   Resp 16   SpO2 100%   Physical Exam Vitals and nursing note reviewed.  Constitutional:      General: He is not in acute distress.    Appearance: Normal appearance. He is well-developed and  well-nourished. He is not toxic-appearing.  HENT:     Head: Normocephalic and atraumatic.  Eyes:     General: Lids are normal.     Extraocular Movements: EOM normal.     Conjunctiva/sclera: Conjunctivae normal.     Pupils: Pupils are equal, round, and reactive to light.  Neck:     Thyroid: No thyroid mass.     Trachea: No tracheal deviation.  Cardiovascular:     Rate and Rhythm: Normal rate and regular rhythm.     Heart sounds: Normal heart sounds. No murmur heard. No gallop.   Pulmonary:     Effort: Pulmonary effort is normal. No respiratory distress.     Breath sounds: Normal  breath sounds. No stridor. No decreased breath sounds, wheezing, rhonchi or rales.  Abdominal:     General: Bowel sounds are normal. There is no distension.     Palpations: Abdomen is soft.     Tenderness: There is no abdominal tenderness. There is no CVA tenderness or rebound.  Musculoskeletal:        General: No tenderness or edema. Normal range of motion.     Cervical back: Normal range of motion and neck supple.  Skin:    General: Skin is warm and dry.     Findings: No abrasion or rash.  Neurological:     Mental Status: He is alert and oriented to person, place, and time.     GCS: GCS eye subscore is 4. GCS verbal subscore is 5. GCS motor subscore is 6.     Cranial Nerves: No cranial nerve deficit.     Sensory: No sensory deficit.     Deep Tendon Reflexes: Strength normal.  Psychiatric:        Mood and Affect: Mood and affect normal.        Speech: Speech normal.        Behavior: Behavior normal.     ED Results / Procedures / Treatments   Labs (all labs ordered are listed, but only abnormal results are displayed) Labs Reviewed  BASIC METABOLIC PANEL - Abnormal; Notable for the following components:      Result Value   CO2 21 (*)    BUN 29 (*)    Creatinine, Ser 2.15 (*)    GFR, Estimated 36 (*)    All other components within normal limits  CBC - Abnormal; Notable for the following  components:   RBC 4.16 (*)    Hemoglobin 11.6 (*)    MCHC 29.4 (*)    All other components within normal limits  URINALYSIS, ROUTINE W REFLEX MICROSCOPIC - Abnormal; Notable for the following components:   APPearance HAZY (*)    Glucose, UA >=500 (*)    Hgb urine dipstick SMALL (*)    Ketones, ur 5 (*)    Protein, ur >=300 (*)    All other components within normal limits  CBG MONITORING, ED  TROPONIN I (HIGH SENSITIVITY)    EKG EKG Interpretation  Date/Time:  Tuesday May 05 2020 19:12:21 EST Ventricular Rate:  75 PR Interval:  190 QRS Duration: 96 QT Interval:  442 QTC Calculation: 493 R Axis:   -17 Text Interpretation: Normal sinus rhythm Possible Left atrial enlargement Anterior infarct , age undetermined Abnormal ECG No significant change since last tracing Confirmed by Lacretia Leigh ([id number]) on 05/06/2020 7:11:18 AM   Radiology DG Shoulder Right  Result Date: 05/05/2020 CLINICAL DATA:  Fall. EXAM: RIGHT SHOULDER - 2+ VIEW COMPARISON:  None. FINDINGS: Mild degenerative changes in the glenohumeral and acromioclavicular joints. No evidence of acute fracture or dislocation. No focal bone lesion or bone destruction. Bone cortex appears intact. IMPRESSION: Mild degenerative changes. No acute bony abnormalities. Electronically Signed   By: Lucienne Capers M.D.   On: 05/05/2020 19:32   DG Wrist Complete Right  Result Date: 05/05/2020 CLINICAL DATA:  Fall. EXAM: RIGHT WRIST - COMPLETE 3+ VIEW COMPARISON:  None. FINDINGS: There is no evidence of fracture or dislocation. There is no evidence of arthropathy or other focal bone abnormality. Soft tissues are unremarkable. IMPRESSION: Negative. Electronically Signed   By: Lucienne Capers M.D.   On: 05/05/2020 19:31   CT HEAD WO CONTRAST  Result Date: 05/05/2020 CLINICAL  DATA:  Dizziness, syncope EXAM: CT HEAD WITHOUT CONTRAST TECHNIQUE: Contiguous axial images were obtained from the base of the skull through the vertex without  intravenous contrast. COMPARISON:  None. FINDINGS: Brain: No acute intracranial abnormality. Specifically, no hemorrhage, hydrocephalus, mass lesion, acute infarction, or significant intracranial injury. Vascular: No hyperdense vessel or unexpected calcification. Skull: No acute calvarial abnormality. Sinuses/Orbits: Visualized paranasal sinuses and mastoids clear. Orbital soft tissues unremarkable. Other: None IMPRESSION: Normal study. Electronically Signed   By: Rolm Baptise M.D.   On: 05/05/2020 20:05    Procedures Procedures   Medications Ordered in ED Medications - No data to display  ED Course  I have reviewed the triage vital signs and the nursing notes.  Pertinent labs & imaging results that were available during my care of the patient were reviewed by me and considered in my medical decision making (see chart for details).    MDM Rules/Calculators/A&P                          Patient is delta troponin here negative.  Pacemaker was interrogated and discussed with cardiology and shows no acute events.  He is not orthostatic here.  Given his a.m. dose of blood pressure medication blood pressure has improved nicely.  Renal function is at baseline.  Head CT negative as well as remaining imaging.  Will discharge home Final Clinical Impression(s) / ED Diagnoses Final diagnoses:  Fall    Rx / DC Orders ED Discharge Orders    None       Lacretia Leigh, MD 05/06/20 1254

## 2020-05-07 ENCOUNTER — Other Ambulatory Visit (HOSPITAL_COMMUNITY): Payer: Medicare Other

## 2020-05-07 ENCOUNTER — Other Ambulatory Visit (HOSPITAL_COMMUNITY): Payer: Self-pay

## 2020-05-07 NOTE — Progress Notes (Signed)
Paramedicine Encounter    Patient ID: Troy Rush, male    DOB: 09-21-1970, 50 y.o.   MRN: [id number]   Patient Care Team: [name], FNP as PCP - General (Family Medicine) Larey Dresser, MD as PCP - Cardiology (Cardiology) Conrad Bonanza, NP as Nurse Practitioner (Cardiology) Larey Dresser, MD as Consulting Physician (Cardiology)  Patient Active Problem List   Diagnosis Date Noted  . Pulmonary embolism ([place]) 02/05/2020  . Acute kidney injury superimposed on CKD ([place])   . Chronic combined systolic and diastolic heart failure ([place])   . Atrial fibrillation with RVR ([place]) 02/01/2020  . NSVT (nonsustained ventricular tachycardia) ([place]) 02/01/2020  . Acute GI bleeding 02/01/2020  . DKA (diabetic ketoacidosis) ([place]) 02/01/2020  . Bilateral deafness 08/15/2019  . Type 2 diabetes mellitus with complication, with long-term current use of insulin ([place]) 08/15/2019  . Hemoglobin A1C between 7% and 9% indicating borderline diabetic control 08/15/2019  . Insomnia 08/15/2019  . Abdominal pain 11/14/2018  . Ischemic cardiomyopathy 10/14/2016  . NICM (nonischemic cardiomyopathy) ([place]) 09/15/2016  . Chronic systolic CHF (congestive heart failure) ([place]) 05/30/2016  . HTN (hypertension) 05/30/2016  . Deaf 05/30/2016  . Hyperkalemia 05/30/2016  . Diabetes ([place]) 03/21/2016  . Major depressive disorder, recurrent episode ([place]) 03/21/2016    Current Outpatient Medications:  .  amiodarone (PACERONE) 200 MG tablet, Take 0.5 tablets (100 mg total) by mouth daily., Disp: 90 tablet, Rfl: 1 .  apixaban (ELIQUIS) 5 MG TABS tablet, Take 1 tablet (5 mg total) by mouth 2 (two) times daily., Disp: 60 tablet, Rfl: 3 .  atorvastatin (LIPITOR) 80 MG tablet, Take 1 tablet (80 mg total) by mouth at bedtime., Disp: 30 tablet, Rfl: 11 .  carvedilol (COREG) 25 MG tablet, Take 1 tablet (25 mg total) by mouth 2 (two) times daily with a meal., Disp: 60 tablet, Rfl: 6 .  dapagliflozin propanediol (FARXIGA) 10  MG TABS tablet, Take 1 tablet (10 mg total) by mouth daily before breakfast., Disp: 30 tablet, Rfl: 11 .  fenofibrate 54 MG tablet, Take 1 tablet (54 mg total) by mouth daily., Disp: 30 tablet, Rfl: 2 .  furosemide (LASIX) 40 MG tablet, Take 1 tablet (40 mg total) by mouth in the morning., Disp: 60 tablet, Rfl: 11 .  hydrALAZINE (APRESOLINE) 100 MG tablet, Take 1 tablet (100 mg total) by mouth 3 (three) times daily., Disp: 90 tablet, Rfl: 3 .  icosapent Ethyl (VASCEPA) 1 g capsule, Take 2 capsules (2 g total) by mouth 2 (two) times daily., Disp: 120 capsule, Rfl: 5 .  insulin glargine (LANTUS) 100 UNIT/ML injection, Inject 0.24 mLs (24 Units total) into the skin daily., Disp: 10 mL, Rfl: 11 .  isosorbide mononitrate (IMDUR) 60 MG 24 hr tablet, Take 1 tablet (60 mg total) by mouth daily., Disp: 30 tablet, Rfl: 11 .  Lancets MISC, 1 each by Does not apply route 3 (three) times daily as needed., Disp: 300 each, Rfl: 3 .  latanoprost (XALATAN) 0.005 % ophthalmic solution, Place 1 drop into both eyes at bedtime., Disp: , Rfl:  .  pantoprazole (PROTONIX) 40 MG tablet, Take 1 tablet (40 mg total) by mouth 2 (two) times daily., Disp: 180 tablet, Rfl: 3 .  Vitamin D, Ergocalciferol, (DRISDOL) 1.25 MG (50000 UNIT) CAPS capsule, Take 1 capsule (50,000 Units total) by mouth every 7 (seven) days. (Patient not taking: Reported on 05/07/2020), Disp: 5 capsule, Rfl: 6 Allergies  Allergen Reactions  . Lisinopril Cough  Social History   Socioeconomic History  . Marital status: Legally Separated    Spouse name: Not on file  . Number of children: Not on file  . Years of education: Not on file  . Highest education level: Not on file  Occupational History  . Not on file  Tobacco Use  . Smoking status: Never Smoker  . Smokeless tobacco: Never Used  Vaping Use  . Vaping Use: Never used  Substance and Sexual Activity  . Alcohol use: Yes    Alcohol/week: 1.0 - 2.0 standard drink    Types: 1 - 2 Glasses  of wine per week    Comment: rare  . Drug use: No  . Sexual activity: Yes  Other Topics Concern  . Not on file  Social History Narrative  . Not on file   Social Determinants of Health   Financial Resource Strain: Medium Risk  . Difficulty of Paying Living Expenses: Somewhat hard  Food Insecurity: No Food Insecurity  . Worried About Charity fundraiser in the Last Year: Never true  . Ran Out of Food in the Last Year: Never true  Transportation Needs: No Transportation Needs  . Lack of Transportation (Medical): No  . Lack of Transportation (Non-Medical): No  Physical Activity: Not on file  Stress: Not on file  Social Connections: Not on file  Intimate Partner Violence: Not on file    Physical Exam      Future Appointments  Date Time Provider [place]  05/15/2020  4:00 PM Deboraha Sprang, MD [facility]  05/25/2020 10:00 AM [facility] PA/NP [facility] None  05/30/2020  8:00 PM Sueanne Margarita, MD [facility]  06/09/2020 10:20 AM [name], FNP [facility] None  06/22/2020  8:35 AM [facility] DEVICE REMOTES [facility]  09/21/2020  8:35 AM [facility] DEVICE REMOTES [facility]  12/21/2020  8:35 AM [facility] DEVICE REMOTES [facility]  03/22/2021  8:35 AM [facility] DEVICE REMOTES [facility]  06/21/2021  8:35 AM [facility] DEVICE REMOTES [facility]  09/20/2021  8:35 AM [facility] DEVICE REMOTES [facility]  12/20/2021  8:35 AM [facility] DEVICE REMOTES [facility]    BP (!) 190/110   Pulse 80   Resp 18   Wt 243 lb (110.2 kg)   SpO2 99%   BMI 35.88 kg/m   Weight yesterday-? Last visit weight-240  Pt had to go to ER the other day due to extreme dizziness causing him to pass out and fall multiple times.  He is talking to his brother in [place] when I arrived and appears to be doing better. He is laughing with conversation with his brother.  meds verified  and pill box refilled.  Next Wednesday-call in [place] and isosorbide  He also needs to get vit d filled, but I believe he has to be seen by his PCP first.  Last note I had sent her she said he is fine until their next appoint which is in march to recheck his levels.  He is back taking his meds, took them last night and today.  Will see if we can get him clinic sooner than 2/14.   [name], [facility] Paramedic  05/07/20

## 2020-05-08 NOTE — Progress Notes (Signed)
Advanced Heart Failure Clinic Note   PCP: Kathe Becton, FNP HF Cardiology: Dr. Aundra Dubin  50 y.o. with deafness, HTN, DM, and chronic systolic CHF presents for followup of CHF.  Patient had no known cardiac problems until 10/16.  At that time, he developed exertional dyspnea and was admitted to a hospital in [place] [employer], [place] with acute systolic CHF.  Echo 10/16 showed EF 15-20%.  Cardiac cath showed no significant coronary disease.  He was diuresed and started on cardiac meds. Subsequently, he moved to [place].  Now working in a [employer].  Repeat echo in 5/18 showed persistently low EF, 25-30%.  He had [place] placed.   He was admitted briefly with CHF exacerbation in 8/20.   Echo in 9/20 showed EF 30-35% with diffuse hypokinesis.   Ultrasound in 4/21 showed right leg DVT and CT showed a PE.  No obvious trigger (no long trip, no surgery, no prolonged immobility).  He was started on apixaban.   He quit taking his meds in 8/21 and was drinking heavily due to depression, later restarted meds.  In 10/21, he was admitted with DKA and atrial fibrillation/RVR (1st known episode).  He converted to NSR spontaneously.  [name] was stopped due to AKI.  He had melena/hematochezia and EGD showed gastritis/esophagitis.  Apixaban was stopped.  He went home but then was re-admitted later in 10/21 with bilateral PEs.   Echo in 10/21 showed EF 45%, mild global hypokinesis, moderate LVH, mildly decreased RV systolic function.   OV on 11/4 and was found to be fluid overloaded. Entresto was restarted at 24-26 mg bid and Lasix increased to 40 mg bid.  Follow up on 02/20/20 fluid status better, no medication changes made due to soft BP.  He returned on 1/22 for HF follow up. Gets SOB with walking upstairs. Wilder Glade was started and hydralazine increased to 100 mg tid this visit.  On 05/05/20 he had syncope with a fall and seen in the ED; he did not hit his head. He had not taken meds that day, BP  was 190/110, HR 70s. Troponin negative, CT head negative, he was not orthostatic in the ED, device interrogation showed no events, and BP reduced with administration of home medication. He was discharged home.  Today he returns for HF follow up. His SCr was elevated on last labs, Entresto stopped and held lasix for 2 days. Overall feeling ok, has some fatigue. Gets SOB with walking up inclines. Denies increasing SOB, CP, dizziness, edema, or PND/Orthopnea. Appetite ok. No fever or chills. Weight at home stable. Taking all medications. Has been eating french fries and hamburgers. 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  Device Interrogation: Heart Logic Score 0, patient activity ~1 hour/day (personally reviewed).  ECG today: not ordered today.  Labs (9/17): BNP 477, K 4.5 => 4.2, creatinine 0.92 => 1.1, BNP 1009, HIV negative, SPEP negative.  Labs (10/17): K 5.2, creatinine 1.10, hgb 12.5 Labs (03/28/2016): K 5.3 Creatinine 1.07  Labs (3/18): digoxin < 0.2, K 4.1, creatinine 0.97 Labs (9/18): hgb 12.2, K 4, creatinine 0.99, BNP 139 Labs (1/19): hgb 11.2, K 4.8, creatinine 1.26 Labs (8/20): K 3.7, creatinine 1.69 Labs (5/21): K 5, creatinine 1.4, LDL 192 Labs (10/21): K 4.1, creatinine 1.77, LDL 180, TGs 138 Labs (11/21): K 4.5, creatinine 2.68 Labs (12/21): K 5.1, creatinine 2.25 Labs (04/30/20): K 4.7, creatinine 2.7 Labs (05/05/20): K 4.0, creatinine 2.15  PMH: 1. HTN 2. Diabetes 3. Hyperlipidemia 4. Deafness since age 89:  Needs sign language interpreter.  5. Chronic systolic CHF: Nonischemic cardiomyopathy.  Diagnosis 10/16 in [place], [place] (admitted with acute systolic CHF).  SPEP and HIV negative.  [place].  - Echo (10/16): EF 15-20%, mild LVH, mild MR, normal RV size and systolic function.  - LHC (10/16): No significant CAD.  - CPX (12/17): Mild functional limitation.  - 04/2016 CMRI: EF 27%. RV normal. No evidence of infiltrative disease, myocarditis, or MI.  - Echo  (5/18): EF 25-30%, mild LV dilation, mild LVH, normal RV size with mildly decreased - Echo (9/20): EF 30-35%, mild LVH, diffuse hypokinesis, normal RV size and systolic function.  - Echo (10/21): EF 45%, mild global hypokinesis, moderate LVH, mildly decreased RV systolic function.  6. Depression. 7. Colitis episodes.  8. CKD stage 3.  9. DVT/PE in 4/21: No obvious trigger.  - Recurrent PE in 10/21, had been off anticoagulation.  10. Atrial fibrillation: Paroxysmal.  11. Gastritis/esophagitis: 10/21.  12. ETOH abuse  SH: From [place] originally.  Lives in [place] until 2017, then moved to [place].  Nonsmoker.  Unemployed.  H/o ETOH abuse.    FH: No known cardiac disease.   ROS: All systems reviewed and negative except as per HPI.   Current Outpatient Medications  Medication Sig Dispense Refill  . amiodarone (PACERONE) 200 MG tablet Take 0.5 tablets (100 mg total) by mouth daily. 30 tablet 0  . apixaban (ELIQUIS) 5 MG TABS tablet Take 1 tablet (5 mg total) by mouth 2 (two) times daily. 60 tablet 3  . atorvastatin (LIPITOR) 80 MG tablet Take 1 tablet (80 mg total) by mouth at bedtime. 30 tablet 11  . carvedilol (COREG) 25 MG tablet Take 1 tablet (25 mg total) by mouth 2 (two) times daily with a meal. 60 tablet 6  . dapagliflozin propanediol (FARXIGA) 10 MG TABS tablet Take 1 tablet (10 mg total) by mouth daily before breakfast. 30 tablet 11  . fenofibrate 54 MG tablet Take 1 tablet (54 mg total) by mouth daily. 30 tablet 2  . furosemide (LASIX) 40 MG tablet Take 1 tablet (40 mg total) by mouth in the morning. 60 tablet 11  . hydrALAZINE (APRESOLINE) 100 MG tablet Take 1 tablet (100 mg total) by mouth 3 (three) times daily. 90 tablet 3  . icosapent Ethyl (VASCEPA) 1 g capsule Take 2 capsules (2 g total) by mouth 2 (two) times daily. 120 capsule 5  . insulin glargine (LANTUS) 100 UNIT/ML injection Inject 0.24 mLs (24 Units total) into the skin daily. 10 mL 11  . isosorbide mononitrate (IMDUR)  60 MG 24 hr tablet Take 1 tablet (60 mg total) by mouth daily. 30 tablet 11  . Lancets MISC 1 each by Does not apply route 3 (three) times daily as needed. 300 each 3  . latanoprost (XALATAN) 0.005 % ophthalmic solution Place 1 drop into both eyes at bedtime.    . pantoprazole (PROTONIX) 40 MG tablet Take 1 tablet (40 mg total) by mouth 2 (two) times daily. 180 tablet 3  . Vitamin D, Ergocalciferol, (DRISDOL) 1.25 MG (50000 UNIT) CAPS capsule Take 1 capsule (50,000 Units total) by mouth every 7 (seven) days. (Patient not taking: Reported on 05/07/2020) 5 capsule 6   No current facility-administered medications for this encounter.   BP 116/62   Pulse 69   Wt 110.8 kg   SpO2 96%   BMI 36.06 kg/m    Wt Readings from Last 3 Encounters:  05/11/20 110.8 kg  05/07/20 110.2 kg  04/30/20 108.9 kg    PHYSICAL EXAM: ReDS Clip 46%  General:  NAD. No resp difficulty. HEENT: bilateral deafness Neck: Supple. JVP 7-8. Carotids 2+ bilat; no bruits. No lymphadenopathy or thryomegaly appreciated. Cor: PMI nondisplaced. Regular rate & rhythm. No rubs, gallops or murmurs. Lungs: Clear Abdomen: Obese, soft, nontender, nondistended. No hepatosplenomegaly. No bruits or masses. Good bowel sounds. Extremities: No cyanosis, clubbing, rash, edema Neuro: alert & oriented x 3, cranial nerves grossly intact. Moves all 4 extremities w/o difficulty. Affect pleasant.  Assessment/Plan: 1. Chronic systolic CHF: Nonischemic cardiomyopathy, etiology uncertain. Consider prior ETOH or HTN.  He has a [place].  cMRI in 1/18 with EF 27%, RV normal, no evidence of infiltrative disease, myocarditis, or MI.  Echo in 9/20 showed EF 30-35%.  Echo in 10/21 showed EF up to 45%. NYHA II,  functional class confounded by inactivity. Volume status slightly elevated, ReDs today 46% - Increase lasix to 40 mg bid x 3 days, then back to 40 mg daily. - Continue Farxiga 10 mg daily. Hgb a1c 7.9 12/21. - Continue Coreg 25 mg  bid.      - Continue hydralazine to 100 mg tid + Imdur 60 mg daily. - Off Entresto with increasing SCr (may re-challenge pending BMET).  - He is off spironolactone for now with prior hyperkalemia.  May consider rechallenge in future with use of Lokelma or Veltasssa.  - Check BMET today; repeat in 2 weeks. - We discussed recommendations for the management of chronic heart failure to control volume/symptoms and reduce risk of acute exacerbation that may necessitate hospitalization.  These measures include continuation of current diuretics with close outpatient monitoring of volume status through daily weights.  Patient advised to check weight daily and to call our office if greater than 3 pound weight gain in 24 hours or greater than 5 pound weight gain in the course of 1 week. Patient also strongly encouraged to adhere to a low salt diet, reducing intake to less than 2 g daily.  2. Type II diabetes: management per PCP. Continue Lantus. He does not check his blood sugars at home. - Continue Farxiga.  3. HTN: has been elevated for past month, per patient and Katie w/ paramedicine.  - Stable today. - Continue hydralazine to 100 mg tid + Imdur 60 mg daily. - Check BMET.  4. Deafness: Sign language interpreter present at visit.   5. CKD: Stage 3.  Last SCr 2.7>>2.15. Check BMET today.   6. PE/DVT: 4/21.  Unclear cause, no prolonged trip, prolonged immobility, or history of malignancy. No FH clotting. Recurrent PE in 10/21 while Eliquis on hold with bleeding from esophagitis/gastritis.  Do no think this was an Eliquis failure.  - Continue apixaban, think he will need long-term with no clear trigger for VTE. No bleeding issues.   7. Hyperlipidemia: LDL very high when last checked, not clear that he had been taking statin.  Goal LDL < 70 with diabetes.  - continue atorvastatin 80 mg daily (paramedicine now helping w/ med compliance)  - LDL 76, HDL 38, trigs 220 (1/22). Consider addition of Zetia/PCSK9  if unable to get to LDL goal.  8. Atrial fibrillation: First noted in setting of DKA in 10/21.  He is now on amiodarone and is in NSR today.  - Continue amiodarone 100 mg daily (reduced 2/2 elevated TSH).  Recent TSH elevated, T3 & T4 ok, repeat TSH 8.426. HFTs ok. He will need a regular eye exam.  - Not ideal to  continue amiodarone long-term, will likely stop in the future if no further AF and arrange for ablation if it recurs.  - Continue Eliquis.   9. ETOH abuse: Encouraged him to quit.  ETOH may play a role in both cardiomyopathy and atrial fibrillation.  Not drinking currently.  10. Snoring: Has had sleep study years ago in [place], patient thinks he was diagnosed with OSA. Paramedicine says he falls asleep during visits occasionally, and snores. - Sleep study pending.  11. Noncompliance: Significant problems with noncompliance around medication use.   - Paramedicine now following, appreciate their assistance.  F/u in APP clinic in 2 months, sooner if needed.  [place], [place]  05/11/2020

## 2020-05-11 ENCOUNTER — Encounter (HOSPITAL_COMMUNITY): Payer: Self-pay

## 2020-05-11 ENCOUNTER — Other Ambulatory Visit: Payer: Self-pay

## 2020-05-11 ENCOUNTER — Other Ambulatory Visit (HOSPITAL_COMMUNITY): Payer: Self-pay

## 2020-05-11 ENCOUNTER — Ambulatory Visit (HOSPITAL_COMMUNITY)
Admission: RE | Admit: 2020-05-11 | Discharge: 2020-05-11 | Disposition: A | Discharge status: Home / Self Care | Payer: Medicare Other | Source: Ambulatory Visit | Attending: Cardiology | Admitting: Cardiology

## 2020-05-11 ENCOUNTER — Other Ambulatory Visit (HOSPITAL_COMMUNITY): Payer: Self-pay | Admitting: Cardiology

## 2020-05-11 VITALS — BP 116/62 | HR 69 | Wt 244.2 lb

## 2020-05-11 DIAGNOSIS — E118 Type 2 diabetes mellitus with unspecified complications: Secondary | ICD-10-CM

## 2020-05-11 DIAGNOSIS — Z95810 Presence of automatic (implantable) cardiac defibrillator: Secondary | ICD-10-CM | POA: Insufficient documentation

## 2020-05-11 DIAGNOSIS — H9190 Unspecified hearing loss, unspecified ear: Secondary | ICD-10-CM | POA: Insufficient documentation

## 2020-05-11 DIAGNOSIS — H9193 Unspecified hearing loss, bilateral: Secondary | ICD-10-CM | POA: Diagnosis not present

## 2020-05-11 DIAGNOSIS — Z9119 Patient's noncompliance with other medical treatment and regimen: Secondary | ICD-10-CM

## 2020-05-11 DIAGNOSIS — I5022 Chronic systolic (congestive) heart failure: Secondary | ICD-10-CM | POA: Insufficient documentation

## 2020-05-11 DIAGNOSIS — Z560 Unemployment, unspecified: Secondary | ICD-10-CM | POA: Diagnosis not present

## 2020-05-11 DIAGNOSIS — I5042 Chronic combined systolic (congestive) and diastolic (congestive) heart failure: Secondary | ICD-10-CM

## 2020-05-11 DIAGNOSIS — I480 Paroxysmal atrial fibrillation: Secondary | ICD-10-CM

## 2020-05-11 DIAGNOSIS — Z8719 Personal history of other diseases of the digestive system: Secondary | ICD-10-CM | POA: Diagnosis not present

## 2020-05-11 DIAGNOSIS — I130 Hypertensive heart and chronic kidney disease with heart failure and stage 1 through stage 4 chronic kidney disease, or unspecified chronic kidney disease: Secondary | ICD-10-CM | POA: Insufficient documentation

## 2020-05-11 DIAGNOSIS — Z79899 Other long term (current) drug therapy: Secondary | ICD-10-CM | POA: Diagnosis not present

## 2020-05-11 DIAGNOSIS — E785 Hyperlipidemia, unspecified: Secondary | ICD-10-CM

## 2020-05-11 DIAGNOSIS — I428 Other cardiomyopathies: Secondary | ICD-10-CM | POA: Insufficient documentation

## 2020-05-11 DIAGNOSIS — F1011 Alcohol abuse, in remission: Secondary | ICD-10-CM

## 2020-05-11 DIAGNOSIS — Z7901 Long term (current) use of anticoagulants: Secondary | ICD-10-CM | POA: Insufficient documentation

## 2020-05-11 DIAGNOSIS — I10 Essential (primary) hypertension: Secondary | ICD-10-CM | POA: Diagnosis not present

## 2020-05-11 DIAGNOSIS — R0683 Snoring: Secondary | ICD-10-CM | POA: Diagnosis not present

## 2020-05-11 DIAGNOSIS — N183 Chronic kidney disease, stage 3 unspecified: Secondary | ICD-10-CM | POA: Insufficient documentation

## 2020-05-11 DIAGNOSIS — Z86718 Personal history of other venous thrombosis and embolism: Secondary | ICD-10-CM | POA: Diagnosis not present

## 2020-05-11 DIAGNOSIS — Z9114 Patient's other noncompliance with medication regimen: Secondary | ICD-10-CM | POA: Insufficient documentation

## 2020-05-11 DIAGNOSIS — Z794 Long term (current) use of insulin: Secondary | ICD-10-CM | POA: Diagnosis not present

## 2020-05-11 DIAGNOSIS — E1122 Type 2 diabetes mellitus with diabetic chronic kidney disease: Secondary | ICD-10-CM | POA: Insufficient documentation

## 2020-05-11 DIAGNOSIS — F1010 Alcohol abuse, uncomplicated: Secondary | ICD-10-CM

## 2020-05-11 DIAGNOSIS — Z91199 Patient's noncompliance with other medical treatment and regimen due to unspecified reason: Secondary | ICD-10-CM

## 2020-05-11 DIAGNOSIS — Z86711 Personal history of pulmonary embolism: Secondary | ICD-10-CM

## 2020-05-11 DIAGNOSIS — N1832 Chronic kidney disease, stage 3b: Secondary | ICD-10-CM

## 2020-05-11 LAB — BASIC METABOLIC PANEL
Anion gap: 10 (ref 5–15)
BUN: 31 mg/dL — ABNORMAL HIGH (ref 6–20)
CO2: 27 mmol/L (ref 22–32)
Calcium: 9.2 mg/dL (ref 8.9–10.3)
Chloride: 102 mmol/L (ref 98–111)
Creatinine, Ser: 2.43 mg/dL — ABNORMAL HIGH (ref 0.61–1.24)
GFR, Estimated: 31 mL/min — ABNORMAL LOW (ref >60)
Glucose, Bld: 139 mg/dL — ABNORMAL HIGH (ref 70–99)
Potassium: 4.2 mmol/L (ref 3.5–5.1)
Sodium: 139 mmol/L (ref 135–145)

## 2020-05-11 NOTE — Progress Notes (Signed)
ReDS Vest / Clip - 05/11/20 0900      ReDS Vest / Clip   Station Marker D    Ruler Value 34    ReDS Value Range High volume overload    ReDS Actual Value 46

## 2020-05-11 NOTE — Progress Notes (Signed)
Paramedicine Encounter   Patient ID: Troy Rush , male,   DOB: 1971/01/08,49 y.o.,  MRN: [id number]   Met patient in clinic today with provider.   Pt here today for f/u from ER visit last week with dizziness, his b/p still running elevated.  Other than the day he was feeling dizzy and he had fallen, he takes his meds daily.   B/p-116/62 p-69 sp02-96 Weight @ clinic-244 REDS clip-46%  His b/pmuch improved from last week. Lasix is being increased to BID for 3 days.  Then go back to daily.  Labs done today.  Will recheck in a few wks.   With his appointment for his sleep study he will call uber/taxi service.  I will see him again on Thursday for med check and reassess weights and b/p.   [name], [place] 05/11/2020

## 2020-05-11 NOTE — Patient Instructions (Addendum)
INCREASE Lasix '40mg'$  (1 tablet) twice a day for 3 days and then DECREASE it back to '40mg'$  (1 tablet) daily  Labs done today, your results will be available in MyChart, we will contact you for abnormal readings.  Your physician recommends that you schedule repeat labs in 2 weeks  Your physician recommends that you schedule a follow-up appointment in: 2 months with APP clinic  If you have any questions or concerns before your next appointment please send [place] a message through [place] or call our office at [phone].    TO LEAVE A MESSAGE FOR THE NURSE SELECT OPTION 2, PLEASE LEAVE A MESSAGE INCLUDING: . YOUR NAME . DATE OF BIRTH . CALL BACK NUMBER . REASON FOR CALL**this is important as we prioritize the call backs  YOU WILL RECEIVE A CALL BACK THE SAME DAY AS LONG AS YOU CALL BEFORE 4:00 PM

## 2020-05-13 ENCOUNTER — Other Ambulatory Visit (HOSPITAL_COMMUNITY): Payer: Self-pay

## 2020-05-13 MED ORDER — AMIODARONE HCL 200 MG PO TABS: 100.0000 mg | ORAL_TABLET | Freq: Every day | ORAL | 0 refills | Status: DC

## 2020-05-14 ENCOUNTER — Other Ambulatory Visit (HOSPITAL_COMMUNITY): Payer: Self-pay

## 2020-05-14 ENCOUNTER — Telehealth (HOSPITAL_COMMUNITY): Payer: Self-pay | Admitting: Licensed Clinical Social Worker

## 2020-05-14 DIAGNOSIS — Z95810 Presence of automatic (implantable) cardiac defibrillator: Secondary | ICD-10-CM | POA: Insufficient documentation

## 2020-05-14 NOTE — Telephone Encounter (Signed)
Pt needing transport to clinic appt tomorrow.  CSW set up through [employer].  Will continue to follow and assist as needed  Jorge Ny, [place] [facility] Desk#: [phone] Cell#: [phone]

## 2020-05-14 NOTE — Progress Notes (Signed)
error 

## 2020-05-14 NOTE — Progress Notes (Signed)
Paramedicine Encounter    Patient ID: Troy Rush, male    DOB: Apr 20, 1970, 50 y.o.   MRN: [id number]   Patient Care Team: [name], FNP as PCP - General (Family Medicine) Larey Dresser, MD as PCP - Cardiology (Cardiology) Conrad [place], NP as Nurse Practitioner (Cardiology) Larey Dresser, MD as Consulting Physician (Cardiology)  Patient Active Problem List   Diagnosis Date Noted  . ICD (implantable cardioverter-defibrillator) in place 05/14/2020  . Pulmonary embolism ([place]) 02/05/2020  . Acute kidney injury superimposed on CKD ([place])   . Chronic combined systolic and diastolic heart failure ([place])   . Atrial fibrillation with RVR ([place]) 02/01/2020  . NSVT (nonsustained ventricular tachycardia) ([place]) 02/01/2020  . Acute GI bleeding 02/01/2020  . DKA (diabetic ketoacidosis) ([place]) 02/01/2020  . Bilateral deafness 08/15/2019  . Type 2 diabetes mellitus with complication, with long-term current use of insulin ([place]) 08/15/2019  . Hemoglobin A1C between 7% and 9% indicating borderline diabetic control 08/15/2019  . Insomnia 08/15/2019  . Abdominal pain 11/14/2018  . Ischemic cardiomyopathy 10/14/2016  . NICM (nonischemic cardiomyopathy) ([place]) 09/15/2016  . Chronic systolic CHF (congestive heart failure) ([place]) 05/30/2016  . HTN (hypertension) 05/30/2016  . Deaf 05/30/2016  . Hyperkalemia 05/30/2016  . Diabetes ([place]) 03/21/2016  . Major depressive disorder, recurrent episode ([place]) 03/21/2016    Current Outpatient Medications:  .  amiodarone (PACERONE) 200 MG tablet, Take 0.5 tablets (100 mg total) by mouth daily., Disp: 30 tablet, Rfl: 0 .  apixaban (ELIQUIS) 5 MG TABS tablet, Take 1 tablet (5 mg total) by mouth 2 (two) times daily., Disp: 60 tablet, Rfl: 3 .  atorvastatin (LIPITOR) 80 MG tablet, Take 1 tablet (80 mg total) by mouth at bedtime., Disp: 30 tablet, Rfl: 11 .  carvedilol (COREG) 25 MG tablet, Take 1 tablet (25 mg total) by mouth 2 (two) times daily with a  meal., Disp: 60 tablet, Rfl: 6 .  dapagliflozin propanediol (FARXIGA) 10 MG TABS tablet, Take 1 tablet (10 mg total) by mouth daily before breakfast., Disp: 30 tablet, Rfl: 11 .  fenofibrate 54 MG tablet, Take 1 tablet (54 mg total) by mouth daily., Disp: 30 tablet, Rfl: 2 .  furosemide (LASIX) 40 MG tablet, Take 1 tablet (40 mg total) by mouth in the morning., Disp: 60 tablet, Rfl: 11 .  hydrALAZINE (APRESOLINE) 100 MG tablet, Take 1 tablet (100 mg total) by mouth 3 (three) times daily., Disp: 90 tablet, Rfl: 3 .  icosapent Ethyl (VASCEPA) 1 g capsule, Take 2 capsules (2 g total) by mouth 2 (two) times daily., Disp: 120 capsule, Rfl: 5 .  insulin glargine (LANTUS) 100 UNIT/ML injection, Inject 0.24 mLs (24 Units total) into the skin daily., Disp: 10 mL, Rfl: 11 .  isosorbide mononitrate (IMDUR) 60 MG 24 hr tablet, Take 1 tablet (60 mg total) by mouth daily., Disp: 30 tablet, Rfl: 11 .  Lancets MISC, 1 each by Does not apply route 3 (three) times daily as needed., Disp: 300 each, Rfl: 3 .  latanoprost (XALATAN) 0.005 % ophthalmic solution, Place 1 drop into both eyes at bedtime., Disp: , Rfl:  .  pantoprazole (PROTONIX) 40 MG tablet, Take 1 tablet (40 mg total) by mouth 2 (two) times daily., Disp: 180 tablet, Rfl: 3 Allergies  Allergen Reactions  . Lisinopril Cough      Social History   Socioeconomic History  . Marital status: Legally Separated    Spouse name: Not on file  . Number of children: Not  on file  . Years of education: Not on file  . Highest education level: Not on file  Occupational History  . Not on file  Tobacco Use  . Smoking status: Never Smoker  . Smokeless tobacco: Never Used  Vaping Use  . Vaping Use: Never used  Substance and Sexual Activity  . Alcohol use: Yes    Alcohol/week: 1.0 - 2.0 standard drink    Types: 1 - 2 Glasses of wine per week    Comment: rare  . Drug use: No  . Sexual activity: Yes  Other Topics Concern  . Not on file  Social History  Narrative  . Not on file   Social Determinants of Health   Financial Resource Strain: Medium Risk  . Difficulty of Paying Living Expenses: Somewhat hard  Food Insecurity: No Food Insecurity  . Worried About Charity fundraiser in the Last Year: Never true  . Ran Out of Food in the Last Year: Never true  Transportation Needs: No Transportation Needs  . Lack of Transportation (Medical): No  . Lack of Transportation (Non-Medical): No  Physical Activity: Not on file  Stress: Not on file  Social Connections: Not on file  Intimate Partner Violence: Not on file    Physical Exam      Future Appointments  Date Time Provider [place]  05/15/2020  4:00 PM Deboraha Sprang, MD [facility]  05/25/2020  1:45 PM [facility] LAB [facility] None  05/30/2020  8:00 PM Sueanne Margarita, MD [facility]  06/09/2020 10:20 AM [name], FNP [facility] None  06/22/2020  8:35 AM [facility] DEVICE REMOTES [facility]  07/06/2020 10:00 AM [facility] PA/NP [facility] None  09/21/2020  8:35 AM [facility] DEVICE REMOTES [facility]  12/21/2020  8:35 AM [facility] DEVICE REMOTES [facility]  03/22/2021  8:35 AM [facility] DEVICE REMOTES [facility]  06/21/2021  8:35 AM [facility] DEVICE REMOTES [facility]  09/20/2021  8:35 AM [facility] DEVICE REMOTES [facility]  12/20/2021  8:35 AM [facility] DEVICE REMOTES [facility]    BP (!) 172/100   Pulse 68   Resp 20   Wt 247 lb (112 kg)   BMI 36.48 kg/m   Weight yesterday-? Last visit weight-244 @ clinic   Pt reports no sob, no dizziness, he has not taken meds yet this morning. Last dose was last night. He should have had enough in pill box to take this morning, so not sure what happened there.  His weight is up 3lbs from Monday despite the increase of lasix for the couple days.  His abd is distended and tight, beginning to get fluid in his legs as  well.  Will need to come out here next tuesday to refills meds-he is short one day of farxiga and 2 days of isosorbide.  Called clinic and got him appoint for tomor. Will use cone txp for that.   -refills needed for carvedilol, isosorbide, farxiga. --ordered those today.  I sent message to Janett Billow regarding his weight increase. I will let him know once I hear back on what she wants to do.    [name], [facility] Paramedic  05/14/20

## 2020-05-15 ENCOUNTER — Ambulatory Visit (INDEPENDENT_AMBULATORY_CARE_PROVIDER_SITE_OTHER): Payer: Medicare Other | Admitting: Internal Medicine

## 2020-05-15 ENCOUNTER — Ambulatory Visit (HOSPITAL_COMMUNITY)
Admission: RE | Admit: 2020-05-15 | Discharge: 2020-05-15 | Disposition: A | Discharge status: Home / Self Care | Payer: Medicare Other | Source: Ambulatory Visit | Attending: Family Medicine | Admitting: Family Medicine

## 2020-05-15 ENCOUNTER — Other Ambulatory Visit: Payer: Self-pay

## 2020-05-15 ENCOUNTER — Encounter (HOSPITAL_COMMUNITY): Payer: Self-pay

## 2020-05-15 ENCOUNTER — Encounter: Payer: Self-pay | Admitting: Internal Medicine

## 2020-05-15 ENCOUNTER — Other Ambulatory Visit (HOSPITAL_COMMUNITY): Payer: Self-pay

## 2020-05-15 VITALS — BP 140/70 | HR 70 | Ht 69.0 in | Wt 241.0 lb

## 2020-05-15 VITALS — BP 134/80 | HR 76 | Wt 245.4 lb

## 2020-05-15 DIAGNOSIS — I5022 Chronic systolic (congestive) heart failure: Secondary | ICD-10-CM | POA: Diagnosis not present

## 2020-05-15 DIAGNOSIS — Z95810 Presence of automatic (implantable) cardiac defibrillator: Secondary | ICD-10-CM | POA: Insufficient documentation

## 2020-05-15 DIAGNOSIS — Z7984 Long term (current) use of oral hypoglycemic drugs: Secondary | ICD-10-CM | POA: Insufficient documentation

## 2020-05-15 DIAGNOSIS — F1011 Alcohol abuse, in remission: Secondary | ICD-10-CM | POA: Diagnosis not present

## 2020-05-15 DIAGNOSIS — Z9114 Patient's other noncompliance with medication regimen: Secondary | ICD-10-CM | POA: Diagnosis not present

## 2020-05-15 DIAGNOSIS — H918X3 Other specified hearing loss, bilateral: Secondary | ICD-10-CM | POA: Insufficient documentation

## 2020-05-15 DIAGNOSIS — Z794 Long term (current) use of insulin: Secondary | ICD-10-CM | POA: Diagnosis not present

## 2020-05-15 DIAGNOSIS — K922 Gastrointestinal hemorrhage, unspecified: Secondary | ICD-10-CM | POA: Insufficient documentation

## 2020-05-15 DIAGNOSIS — N183 Chronic kidney disease, stage 3 unspecified: Secondary | ICD-10-CM | POA: Insufficient documentation

## 2020-05-15 DIAGNOSIS — I428 Other cardiomyopathies: Secondary | ICD-10-CM

## 2020-05-15 DIAGNOSIS — Z79899 Other long term (current) drug therapy: Secondary | ICD-10-CM | POA: Diagnosis not present

## 2020-05-15 DIAGNOSIS — I130 Hypertensive heart and chronic kidney disease with heart failure and stage 1 through stage 4 chronic kidney disease, or unspecified chronic kidney disease: Secondary | ICD-10-CM | POA: Diagnosis not present

## 2020-05-15 DIAGNOSIS — Z86718 Personal history of other venous thrombosis and embolism: Secondary | ICD-10-CM | POA: Insufficient documentation

## 2020-05-15 DIAGNOSIS — I5042 Chronic combined systolic (congestive) and diastolic (congestive) heart failure: Secondary | ICD-10-CM

## 2020-05-15 DIAGNOSIS — E1122 Type 2 diabetes mellitus with diabetic chronic kidney disease: Secondary | ICD-10-CM | POA: Insufficient documentation

## 2020-05-15 DIAGNOSIS — R0683 Snoring: Secondary | ICD-10-CM | POA: Diagnosis not present

## 2020-05-15 DIAGNOSIS — I4891 Unspecified atrial fibrillation: Secondary | ICD-10-CM | POA: Insufficient documentation

## 2020-05-15 DIAGNOSIS — E785 Hyperlipidemia, unspecified: Secondary | ICD-10-CM | POA: Diagnosis not present

## 2020-05-15 LAB — CBC
HCT: 33.5 % — ABNORMAL LOW (ref 39.0–52.0)
Hemoglobin: 10.6 g/dL — ABNORMAL LOW (ref 13.0–17.0)
MCH: 28.4 pg (ref 26.0–34.0)
MCHC: 31.6 g/dL (ref 30.0–36.0)
MCV: 89.8 fL (ref 80.0–100.0)
Platelets: 251 10*3/uL (ref 150–400)
RBC: 3.73 MIL/uL — ABNORMAL LOW (ref 4.22–5.81)
RDW: 12.7 % (ref 11.5–15.5)
WBC: 4.4 10*3/uL (ref 4.0–10.5)
nRBC: 0 % (ref 0.0–0.2)

## 2020-05-15 LAB — COMPREHENSIVE METABOLIC PANEL
ALT: 25 U/L (ref 0–44)
AST: 31 U/L (ref 15–41)
Albumin: 3.8 g/dL (ref 3.5–5.0)
Alkaline Phosphatase: 41 U/L (ref 38–126)
Anion gap: 12 (ref 5–15)
BUN: 33 mg/dL — ABNORMAL HIGH (ref 6–20)
CO2: 26 mmol/L (ref 22–32)
Calcium: 9.4 mg/dL (ref 8.9–10.3)
Chloride: 99 mmol/L (ref 98–111)
Creatinine, Ser: 2.69 mg/dL — ABNORMAL HIGH (ref 0.61–1.24)
GFR, Estimated: 28 mL/min — ABNORMAL LOW (ref >60)
Glucose, Bld: 158 mg/dL — ABNORMAL HIGH (ref 70–99)
Potassium: 4.4 mmol/L (ref 3.5–5.1)
Sodium: 137 mmol/L (ref 135–145)
Total Bilirubin: 0.7 mg/dL (ref 0.3–1.2)
Total Protein: 7.5 g/dL (ref 6.5–8.1)

## 2020-05-15 LAB — BRAIN NATRIURETIC PEPTIDE: B Natriuretic Peptide: 93.5 pg/mL (ref 0.0–100.0)

## 2020-05-15 MED ORDER — FUROSEMIDE 40 MG PO TABS: ORAL_TABLET | ORAL | 11 refills | Status: DC

## 2020-05-15 MED ORDER — AMIODARONE HCL 200 MG PO TABS: 100.0000 mg | ORAL_TABLET | Freq: Every day | ORAL | 0 refills | Status: DC

## 2020-05-15 NOTE — Patient Instructions (Signed)
INCREASE Lasix to 40 mg in the AM and 20 mg in the PM  Labs today We will only contact you if something comes back abnormal or we need to make some changes. Otherwise no news is good news!  Your physician recommends that you schedule a follow-up appointment in: 3 weeks  in the Advanced Practitioners (PA/NP) [place] have been referred to [facility] Address: [address], [place], [place] Phone: [phone] -they will be in contact with appt details   The Fate consists of a small pressure-sensing device that is implanted directly into your pulmonary artery. Once implanted, the sensor measures and transmits your blood flow pressure and heart rate.     You are scheduled for a Cardiac Catheterization on Thursday, February 10 with Dr. Loralie Champagne.  1. Please arrive at the [facility] (Main Entrance A) at [facility]: [address] [place], [place] [address] at 7:00 AM (This time is two hours before your procedure to ensure your preparation). Free valet parking service is available.   Special note: Every effort is made to have your procedure done on time. Please understand that emergencies sometimes delay scheduled procedures.  2. Diet: Do not eat solid foods after midnight.  The patient may have clear liquids until 5am upon the day of the procedure.  3. Labs: Pre Procedure labs done 05/15/20  You will need a pre procedure COVID test    WHEN:  05/19/20 anytime between 9am-3pm WHERE: COVID Test Site [address] [place], [place] [address]  This is a drive thru testing site, you will remain in your car. Be sure to get in the line FOR PROCEDURES Once you have been swabbed you will need to remain home in quarantine until you return for your procedure.  4. Medication instructions in preparation for your procedure:   Contrast Allergy: No    Stop taking Eliquis (Apixiban) on Wednesday, February 9. and Thursday, February  10.  Stop taking, Lasix (Furosemide)  Thursday, February 10,  Take only 12 units of insulin the night before your procedure. Do not take any insulin on the day of the procedure.    On the morning of your procedure, take your Aspirin and any morning medicines NOT listed above.  You may use sips of water.  5. Plan for one night stay--bring personal belongings. 6. Bring a current list of your medications and current insurance cards. 7. You MUST have a responsible person to drive you home. 8. Someone MUST be with you the first 24 hours after you arrive home or your discharge will be delayed. 9. Please wear clothes that are easy to get on and off and wear slip-on shoes.  Thank you for allowing [place] to care for you!   -- [facility]

## 2020-05-15 NOTE — Patient Instructions (Addendum)
Medication Instructions:  Your physician recommends that you continue on your current medications as directed. Please refer to the Current Medication list given to you today.  *If you need a refill on your cardiac medications before your next appointment, please call your pharmacy*   Lab Work: None ordered.  If you have labs (blood work) drawn today and your tests are completely normal, you will receive your results only by: Marland Kitchen MyChart Message (if you have MyChart) OR . A paper copy in the mail If you have any lab test that is abnormal or we need to change your treatment, we will call you to review the results.   Testing/Procedures: None ordered.    Follow-Up: At [facility], you and your health needs are our priority.  As part of our continuing mission to provide you with exceptional heart care, we have created designated Provider Care Teams.  These Care Teams include your primary Cardiologist (physician) and Advanced Practice Providers (APPs -  Physician Assistants and Nurse Practitioners) who all work together to provide you with the care you need, when you need it.  We recommend signing up for the patient portal called "MyChart".  Sign up information is provided on this After Visit Summary.  MyChart is used to connect with patients for Virtual Visits (Telemedicine).  Patients are able to view lab/test results, encounter notes, upcoming appointments, etc.  Non-urgent messages can be sent to your provider as well.   To learn more about what you can do with MyChart, go to [email or web address].    Your next appointment:   12 month(s)  The format for your next appointment:   In Person  Provider:   Virl Axe, MD or Dr Olin Pia PA

## 2020-05-15 NOTE — Progress Notes (Signed)
Patient Care Team: [name], FNP as PCP - General (Family Medicine) Larey Dresser, MD as PCP - Cardiology (Cardiology) Conrad Paris, NP as Nurse Practitioner (Cardiology) Larey Dresser, MD as Consulting Physician (Cardiology)   HPI  Troy Rush is a 50 y.o. male Deaf male with nonischemic cardiomyopathy poorly controlled hypertension who underwent [employer]  ICD implantation for primary prevention also history of atrial fibrillation for which he has been on amiodarone a gradual down titration  History of PE on anticoagulation  Intercurrent syncope 1/22.  ICD interrogation demonstrated no arrhythmias but there was an orthostatic blood pressure drop 212--178.  Significantly short of breath with 3-5 minutes of walking.  Nocturnal dyspnea and orthopnea.  Peripheral edema Response to diuretics appears to be really sparse  Saw however DM this morning and BNP was normal creatinine was up more.    DATE TEST EF   10/16 LHC  No obstruct CAD  5/18 Echo   15-20 %   9/20 Echo   30-35 %   10/21 Echo  40-45%    Date Cr K Hgb  2/22 2.7<<2.15 4.0 10.6          Anemia with hemoglobins in the last 3 months ranging [id number]  Thromboembolic risk factors (, HTN-1, DM-1, CHF-1) for a CHADSVASc Score of >= 3  Past Medical History:  Diagnosis Date  . AICD (automatic cardioverter/defibrillator) present 10/14/2016  . Bilateral deafness   . Bronchitis   . CHF (congestive heart failure) ([place])   . Depression   . Diabetes mellitus without complication ([place])   . DVT, lower extremity ([place]) 07/12/2019  . Dyslipidemia   . Headache 08/2019  . Hypertension   . Insomnia 08/2019  . NICM (nonischemic cardiomyopathy) ([place]) 09/15/2016  . Pancreatitis   . Vitamin D deficiency 08/2019    Past Surgical History:  Procedure Laterality Date  . BACK SURGERY    . CARDIAC CATHETERIZATION    . ESOPHAGOGASTRODUODENOSCOPY (EGD) WITH PROPOFOL N/A 02/02/2020   Procedure:  ESOPHAGOGASTRODUODENOSCOPY (EGD) WITH PROPOFOL;  Surgeon: Arta Silence, MD;  Location: [facility] ENDOSCOPY;  Service: Endoscopy;  Laterality: N/A;  . ICD IMPLANT  10/14/2016  . ICD IMPLANT N/A 10/14/2016   Procedure: ICD Implant;  Surgeon: Deboraha Sprang, MD;  Location: [facility];  Service: Cardiovascular;  Laterality: N/A;  . WRIST SURGERY      Current Outpatient Medications  Medication Sig Dispense Refill  . amiodarone (PACERONE) 200 MG tablet Take 200 mg by mouth daily.    Marland Kitchen apixaban (ELIQUIS) 5 MG TABS tablet Take 1 tablet (5 mg total) by mouth 2 (two) times daily. 60 tablet 3  . atorvastatin (LIPITOR) 80 MG tablet Take 1 tablet (80 mg total) by mouth at bedtime. 30 tablet 11  . carvedilol (COREG) 25 MG tablet Take 1 tablet (25 mg total) by mouth 2 (two) times daily with a meal. 60 tablet 6  . dapagliflozin propanediol (FARXIGA) 10 MG TABS tablet Take 1 tablet (10 mg total) by mouth daily before breakfast. 30 tablet 11  . fenofibrate 54 MG tablet Take 1 tablet (54 mg total) by mouth daily. 30 tablet 2  . furosemide (LASIX) 40 MG tablet Take 1 tablet (40 mg total) by mouth in the morning AND 0.5 tablets (20 mg total) every evening. 90 tablet 11  . hydrALAZINE (APRESOLINE) 100 MG tablet Take 1 tablet (100 mg total) by mouth 3 (three) times daily. 90 tablet 3  .  icosapent Ethyl (VASCEPA) 1 g capsule Take 2 capsules (2 g total) by mouth 2 (two) times daily. 120 capsule 5  . insulin glargine (LANTUS) 100 UNIT/ML injection Inject 0.24 mLs (24 Units total) into the skin daily. 10 mL 11  . isosorbide mononitrate (IMDUR) 60 MG 24 hr tablet Take 1 tablet (60 mg total) by mouth daily. 30 tablet 11  . Lancets MISC 1 each by Does not apply route 3 (three) times daily as needed. 300 each 3  . latanoprost (XALATAN) 0.005 % ophthalmic solution Place 1 drop into both eyes at bedtime.    . pantoprazole (PROTONIX) 40 MG tablet Take 1 tablet (40 mg total) by mouth 2 (two) times daily. 180 tablet 3   No  current facility-administered medications for this visit.    Allergies  Allergen Reactions  . Lisinopril Cough      Review of Systems negative except from HPI and PMH  Physical Exam   BP 140/70   Pulse 70   Ht '5\' 9"'$  (1.753 m)   Wt 241 lb (109.3 kg)   SpO2 98%   BMI 35.59 kg/m  Well developed and well nourished in no acute distress HENT normal Neck supple with JVP-flat Clear Device pocket well healed; without hematoma or erythema.  There is no tethering  Regular rate and rhythm, no  * murmur Abd-soft with active BS No Clubbing cyanosis 1+ edema Skin-warm and dry A & Oriented  Grossly normal sensory and motor function  ECG dated today sinus rhythm at 73 Intervals 20/11/44 Poor R wave progression    Assessment and  Plan  Nonischemic cardiomyopathy  Congestive heart failure-chronic-systolic 3b  Hypertension-poorly controlled  Sleep disordered breathing question sleep apnea  Deafness-noncongenital  Renal failure chronic    Device function normal.  No interval atrial fibrillation.  Anemia but no clinically acute bleeding.  We will continue anticoagulation  Volume overloaded and quite dyspneic.   Was going to increase his diuretic; however, talked with Dr. Aundra Dubin.  Reviewed the labs from this morning with a normal BNP and a creatinine having gone from 2.15 a few weeks ago to 2.69 today.  He would like to continue the diuretics as they have been and anticipates a right heart catheterization on Thursday Communicating via the interpreter

## 2020-05-17 NOTE — H&P (View-Only) (Signed)
Advanced Heart Failure Clinic Note   PCP: Kathe Becton, FNP HF Cardiology: Dr. Aundra Dubin  50 y.o. with deafness, HTN, DM, and chronic systolic CHF presents for followup of CHF.  Patient had no known cardiac problems until 10/16.  At that time, he developed exertional dyspnea and was admitted to a hospital in [place], [place] with acute systolic CHF.  Echo 10/16 showed EF 15-20%.  Cardiac cath showed no significant coronary disease.  He was diuresed and started on cardiac meds. Subsequently, he moved to [facility].  Now working in a [employer].  Repeat echo in 5/18 showed persistently low EF, 25-30%.  He had [place] placed.   He was admitted briefly with CHF exacerbation in 8/20.   Echo in 9/20 showed EF 30-35% with diffuse hypokinesis.   Ultrasound in 4/21 showed right leg DVT and CT showed a PE.  No obvious trigger (no long trip, no surgery, no prolonged immobility).  He was started on apixaban.   He quit taking his meds in 8/21 and was drinking heavily due to depression, later restarted meds.  In 10/21, he was admitted with DKA and atrial fibrillation/RVR (1st known episode).  He converted to NSR spontaneously.  [name] was stopped due to AKI.  He had melena/hematochezia and EGD showed gastritis/esophagitis.  Apixaban was stopped.  He went home but then was re-admitted later in 10/21 with bilateral PEs.   Echo in 10/21 showed EF 45%, mild global hypokinesis, moderate LVH, mildly decreased RV systolic function.   OV on 02/13/20 and was found to be fluid overloaded. Entresto was restarted at 24-26 mg bid and Lasix increased to 40 mg bid.  Follow up on 02/20/20 fluid status better, no medication changes made due to soft BP.  He returned on 1/22 for HF follow up. Gets SOB with walking upstairs. Wilder Glade was started and hydralazine increased to 100 mg tid this visit.  [name] was stopped due to elevated creatinine.  Today he returns for HF follow up.  He is taking all his meds.   On 05/05/20, he had a near-syncopal event and went to the ER, no events on ICD interrogation.  ?Orthostatic.  No further dizziness/lightheadedness.  BP stable today.  Weight up 1 lb.  He is working at [employer] in [employer].  Able to do his job without dyspnea, but he gets short of breath after walking about 50 feet.  He is not sleeping much at night, gets sleepy during the day.  No orthopnea/PND.  No ETOH.   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  Labs (10/17): K 5.2, creatinine 1.10, hgb 12.5 Labs (03/28/2016): K 5.3 Creatinine 1.07  Labs (3/18): digoxin < 0.2, K 4.1, creatinine 0.97 Labs (9/18): hgb 12.2, K 4, creatinine 0.99, BNP 139 Labs (1/19): hgb 11.2, K 4.8, creatinine 1.26 Labs (8/20): K 3.7, creatinine 1.69 Labs (5/21): K 5, creatinine 1.4, LDL 192 Labs (10/21): K 4.1, creatinine 1.77, LDL 180, TGs 138 Labs (11/21): K 4.5, creatinine 2.68 Labs (12/21): K 5.1, creatinine 2.25 Labs (04/30/20): K 4.7, creatinine 2.7 Labs (05/05/20): K 4.0, creatinine 2.15 Labs (05/11/20): K 4.2, creatinine 2.43, LDL 76, HDL 38, TGs 220  PMH: 1. HTN 2. Diabetes 3. Hyperlipidemia 4. Deafness since age 56: Needs sign language interpreter.  5. Chronic systolic CHF: Nonischemic cardiomyopathy.  [place] 10[place] in [place] ([place] CHF).  SPEP and HIV negative.  [place].  - Echo (10/16): EF 15-20%, mild LVH, mild MR, normal RV size and systolic function.  - LHC (10/16): No significant CAD.  - CPX (12/17): Mild functional limitation.  - 04/2016 CMRI: EF 27%. RV normal. No evidence of infiltrative disease, myocarditis, or MI.  - Echo (5/18): EF 25-30%, mild LV dilation, mild LVH, normal RV size with mildly decreased - Echo (9/20): EF 30-35%, mild LVH, diffuse hypokinesis, normal RV size and systolic function.   - Echo (10/21): EF 45%, mild global hypokinesis, moderate LVH, mildly decreased RV systolic function.  6. Depression. 7. Colitis episodes.  8. CKD stage 3.  9. DVT/PE in 4/21: No obvious trigger.  - Recurrent PE in 10/21, had been off anticoagulation.  10. Atrial fibrillation: Paroxysmal.  11. Gastritis/esophagitis: 10/21.  12. ETOH abuse  SH: From [place] originally.  Lives in [place] until 2017, then moved to [place].  Nonsmoker.  Unemployed.  H/o ETOH abuse.    FH: No known cardiac disease.   ROS: All systems reviewed and negative except as per HPI.   Current Outpatient Medications  Medication Sig Dispense Refill  . apixaban (ELIQUIS) 5 MG TABS tablet Take 1 tablet (5 mg total) by mouth 2 (two) times daily. 60 tablet 3  . atorvastatin (LIPITOR) 80 MG tablet Take 1 tablet (80 mg total) by mouth at bedtime. 30 tablet 11  . carvedilol (COREG) 25 MG tablet Take 1 tablet (25 mg total) by mouth 2 (two) times daily with a meal. 60 tablet 6  . dapagliflozin propanediol (FARXIGA) 10 MG TABS tablet Take 1 tablet (10 mg total) by mouth daily before breakfast. 30 tablet 11  . fenofibrate 54 MG tablet Take 1 tablet (54 mg total) by mouth daily. 30 tablet 2  . hydrALAZINE (APRESOLINE) 100 MG tablet Take 1 tablet (100 mg total) by mouth 3 (three) times daily. 90 tablet 3  . icosapent Ethyl (VASCEPA) 1 g capsule Take 2 capsules (2 g total) by mouth 2 (two) times daily. 120 capsule 5  . insulin glargine (LANTUS) 100 UNIT/ML injection Inject 0.24 mLs (24 Units total) into the skin daily. 10 mL 11  . isosorbide mononitrate (IMDUR) 60 MG 24 hr tablet Take 1 tablet (60 mg total) by mouth daily. 30 tablet 11  . Lancets MISC 1 each by Does not apply route 3 (three) times daily as needed. 300 each 3  . latanoprost (XALATAN) 0.005 % ophthalmic solution Place 1 drop into both eyes at bedtime.    . pantoprazole (PROTONIX) 40 MG tablet Take 1 tablet (40 mg total) by mouth 2 (two) times daily. 180 tablet 3  .  amiodarone (PACERONE) 200 MG tablet Take 200 mg by mouth daily.    . furosemide (LASIX) 40 MG tablet Take 1 tablet (40 mg total) by mouth in the morning AND 0.5 tablets (20 mg total) every evening. 90 tablet 11   No current facility-administered medications for this encounter.   BP 134/80   Pulse 76   Wt 111.3 kg (245 lb 6.4 oz)   SpO2 95%   BMI 36.24 kg/m    Wt Readings from Last 3 Encounters:  05/15/20 111.3 kg (245 lb 6.4 oz)  05/15/20 109.3 kg (241 lb)  05/14/20 112 kg (247 lb)    PHYSICAL EXAM: General: NAD Neck: Thick, JVP 8 cm, no thyromegaly or thyroid nodule.  Lungs: Mild crackles at bases. CV: Nondisplaced PMI.  Heart regular  S1/S2, no S3/S4, no murmur.  1+ ankle edema.  No carotid bruit.  Normal pedal pulses.  Abdomen: Soft, nontender, no hepatosplenomegaly, no distention.  Skin: Intact without lesions or rashes.  Neurologic: Alert and oriented x 3.  Psych: Normal affect. Extremities: No clubbing or cyanosis.  HEENT: Normal.    Assessment/Plan: 1. Chronic systolic CHF: Nonischemic cardiomyopathy, etiology uncertain. Consider prior ETOH or HTN.  He has a [place].  cMRI in 1/18 with EF 27%, RV normal, no evidence of infiltrative disease, myocarditis, or MI.  Echo in 9/20 showed EF 30-35%.  Echo in 10/21 showed EF up to 45% with mild RV dysfunction. NYHA III.  Creatinine has been trending up, today it came back at 2.69.  Exam is difficult for volume though he reports significant dyspneic symptoms.  [employer] Heartlogic is 0, not suggesting significant volume overload and BNP is only 93.  REDS clip device is not working today.  - Keep Lasix at 40 mg daily for now, do not increase today.  - Continue Farxiga 10 mg daily.  - Continue Coreg 25 mg bid.      - Continue hydralazine to 100 mg tid + Imdur 60 mg daily. - Off Entresto and spironolactone with elevated creatinine.  - I think that he needs RHC to get a better handle on volume status (significant  symptoms, difficult exam, Heartlogic and BNP unremarkable).  Will arrange this for next week.  Discussed risks/benefits, patient agrees to procedure.  He will hold Eliquis day before and day of.  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  2. Type II diabetes: management per PCP. Continue Lantus. He does not check his blood sugars at home. - Continue Farxiga. 3. HTN: BP controlled today. 4. Deafness: Sign language interpreter present at visit.  5. CKD: Stage 3.  Creatinine 2.69 today.  - Continue Wilder Glade for now.  - Refer to nephrology.  6. PE/DVT: 4/21.  Unclear cause, no prolonged trip, prolonged immobility, or history of malignancy. No FH clotting. Recurrent PE in 10/21 while Eliquis on hold with bleeding from esophagitis/gastritis.  Do no think this was an Eliquis failure.  - Continue apixaban, think he will need long-term with no clear trigger for VTE. No bleeding issues.  7. Hyperlipidemia: Continue atorvastatin, good lipids 1/22.  8. Atrial fibrillation: First noted in setting of DKA in 10/21.  He is now on amiodarone and is in NSR today.  - Continue amiodarone 100 mg daily. Recent TSH mildly  elevated, T3 & T4 ok.  Check LFTs today. He will need a regular eye exam.  - Not ideal to continue amiodarone long-term, will likely stop in the future if no further AF and arrange for ablation if it recurs.  - Continue Eliquis.  9. ETOH abuse: ETOH may have played a role in both cardiomyopathy and atrial fibrillation.  Not drinking currently. 10. Snoring: Has had sleep study years ago in [place], patient thinks he was diagnosed with OSA. Paramedicine says he falls asleep during visits occasionally, and snores. - ordered.  11. Noncompliance: Significant problems with noncompliance around medication use.   - Paramedicine now following, appreciate their assistance.  Followup in 3 wks with NP/PA.   Loralie Champagne, MD  05/17/2020

## 2020-05-17 NOTE — Progress Notes (Signed)
Advanced Heart Failure Clinic Note   PCP: Kathe Becton, FNP HF Cardiology: Dr. Aundra Dubin  50 y.o. with deafness, HTN, DM, and chronic systolic CHF presents for followup of CHF.  Patient had no known cardiac problems until 10/16.  At that time, he developed exertional dyspnea and was admitted to a hospital in [place], [place] with acute systolic CHF.  Echo 10/16 showed EF 15-20%.  Cardiac cath showed no significant coronary disease.  He was diuresed and started on cardiac meds. Subsequently, he moved to [facility].  Now working in a [employer].  Repeat echo in 5/18 showed persistently low EF, 25-30%.  He had [place] placed.   He was admitted briefly with CHF exacerbation in 8/20.   Echo in 9/20 showed EF 30-35% with diffuse hypokinesis.   Ultrasound in 4/21 showed right leg DVT and CT showed a PE.  No obvious trigger (no long trip, no surgery, no prolonged immobility).  He was started on apixaban.   He quit taking his meds in 8/21 and was drinking heavily due to depression, later restarted meds.  In 10/21, he was admitted with DKA and atrial fibrillation/RVR (1st known episode).  He converted to NSR spontaneously.  [name] was stopped due to AKI.  He had melena/hematochezia and EGD showed gastritis/esophagitis.  Apixaban was stopped.  He went home but then was re-admitted later in 10/21 with bilateral PEs.   Echo in 10/21 showed EF 45%, mild global hypokinesis, moderate LVH, mildly decreased RV systolic function.   OV on 02/13/20 and was found to be fluid overloaded. Entresto was restarted at 24-26 mg bid and Lasix increased to 40 mg bid.  Follow up on 02/20/20 fluid status better, no medication changes made due to soft BP.  He returned on 1/22 for HF follow up. Gets SOB with walking upstairs. Wilder Glade was started and hydralazine increased to 100 mg tid this visit.  [name] was stopped due to elevated creatinine.  Today he returns for HF follow up.  He is taking all his meds.   On 05/05/20, he had a near-syncopal event and went to the ER, no events on ICD interrogation.  ?Orthostatic.  No further dizziness/lightheadedness.  BP stable today.  Weight up 1 lb.  He is working at [employer] in [employer].  Able to do his job without dyspnea, but he gets short of breath after walking about 50 feet.  He is not sleeping much at night, gets sleepy during the day.  No orthopnea/PND.  No ETOH.   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  Labs (10/17): K 5.2, creatinine 1.10, hgb 12.5 Labs (03/28/2016): K 5.3 Creatinine 1.07  Labs (3/18): digoxin < 0.2, K 4.1, creatinine 0.97 Labs (9/18): hgb 12.2, K 4, creatinine 0.99, BNP 139 Labs (1/19): hgb 11.2, K 4.8, creatinine 1.26 Labs (8/20): K 3.7, creatinine 1.69 Labs (5/21): K 5, creatinine 1.4, LDL 192 Labs (10/21): K 4.1, creatinine 1.77, LDL 180, TGs 138 Labs (11/21): K 4.5, creatinine 2.68 Labs (12/21): K 5.1, creatinine 2.25 Labs (04/30/20): K 4.7, creatinine 2.7 Labs (05/05/20): K 4.0, creatinine 2.15 Labs (05/11/20): K 4.2, creatinine 2.43, LDL 76, HDL 38, TGs 220  PMH: 1. HTN 2. Diabetes 3. Hyperlipidemia 4. Deafness since age 4: Needs sign language interpreter.  5. Chronic systolic CHF: Nonischemic cardiomyopathy.  [place]).  SPEP and HIV negative.  [place].  - Echo (10/16): EF 15-20%, mild LVH, mild MR, normal RV size and systolic function.  - LHC (10/16): No significant CAD.  - CPX (12/17): Mild functional limitation.  - 04/2016 CMRI: EF 27%. RV normal. No evidence of infiltrative disease, myocarditis, or MI.  - Echo (5/18): EF 25-30%, mild LV dilation, mild LVH, normal RV size with mildly decreased - Echo (9/20): EF 30-35%, mild LVH, diffuse hypokinesis, normal RV size and systolic function.   - Echo (10/21): EF 45%, mild global hypokinesis, moderate LVH, mildly decreased RV systolic function.  6. Depression. 7. Colitis episodes.  8. CKD stage 3.  9. DVT/PE in 4/21: No obvious trigger.  - Recurrent PE in 10/21, had been off anticoagulation.  10. Atrial fibrillation: Paroxysmal.  11. Gastritis/esophagitis: 10/21.  12. ETOH abuse  SH: From [place] originally.  Lives in [place] until 2017, then moved to [place].  Nonsmoker.  Unemployed.  H/o ETOH abuse.    FH: No known cardiac disease.   ROS: All systems reviewed and negative except as per HPI.   Current Outpatient Medications  Medication Sig Dispense Refill  . apixaban (ELIQUIS) 5 MG TABS tablet Take 1 tablet (5 mg total) by mouth 2 (two) times daily. 60 tablet 3  . atorvastatin (LIPITOR) 80 MG tablet Take 1 tablet (80 mg total) by mouth at bedtime. 30 tablet 11  . carvedilol (COREG) 25 MG tablet Take 1 tablet (25 mg total) by mouth 2 (two) times daily with a meal. 60 tablet 6  . dapagliflozin propanediol (FARXIGA) 10 MG TABS tablet Take 1 tablet (10 mg total) by mouth daily before breakfast. 30 tablet 11  . fenofibrate 54 MG tablet Take 1 tablet (54 mg total) by mouth daily. 30 tablet 2  . hydrALAZINE (APRESOLINE) 100 MG tablet Take 1 tablet (100 mg total) by mouth 3 (three) times daily. 90 tablet 3  . icosapent Ethyl (VASCEPA) 1 g capsule Take 2 capsules (2 g total) by mouth 2 (two) times daily. 120 capsule 5  . insulin glargine (LANTUS) 100 UNIT/ML injection Inject 0.24 mLs (24 Units total) into the skin daily. 10 mL 11  . isosorbide mononitrate (IMDUR) 60 MG 24 hr tablet Take 1 tablet (60 mg total) by mouth daily. 30 tablet 11  . Lancets MISC 1 each by Does not apply route 3 (three) times daily as needed. 300 each 3  . latanoprost (XALATAN) 0.005 % ophthalmic solution Place 1 drop into both eyes at bedtime.    . pantoprazole (PROTONIX) 40 MG tablet Take 1 tablet (40 mg total) by mouth 2 (two) times daily. 180 tablet 3  .  amiodarone (PACERONE) 200 MG tablet Take 200 mg by mouth daily.    . furosemide (LASIX) 40 MG tablet Take 1 tablet (40 mg total) by mouth in the morning AND 0.5 tablets (20 mg total) every evening. 90 tablet 11   No current facility-administered medications for this encounter.   BP 134/80   Pulse 76   Wt 111.3 kg (245 lb 6.4 oz)   SpO2 95%   BMI 36.24 kg/m    Wt Readings from Last 3 Encounters:  05/15/20 111.3 kg (245 lb 6.4 oz)  05/15/20 109.3 kg (241 lb)  05/14/20 112 kg (247 lb)    PHYSICAL EXAM: General: NAD Neck: Thick, JVP 8 cm, no thyromegaly or thyroid nodule.  Lungs: Mild crackles at bases. CV: Nondisplaced PMI.  Heart regular  S1/S2, no S3/S4, no murmur.  1+ ankle edema.  No carotid bruit.  Normal pedal pulses.  Abdomen: Soft, nontender, no hepatosplenomegaly, no distention.  Skin: Intact without lesions or rashes.  Neurologic: Alert and oriented x 3.  Psych: Normal affect. Extremities: No clubbing or cyanosis.  HEENT: Normal.    Assessment/Plan: 1. Chronic systolic CHF: Nonischemic cardiomyopathy, etiology uncertain. Consider prior ETOH or HTN.  He has a [place].  cMRI in 1/18 with EF 27%, RV normal, no evidence of infiltrative disease, myocarditis, or MI.  Echo in 9/20 showed EF 30-35%.  Echo in 10/21 showed EF up to 45% with mild RV dysfunction. NYHA III.  Creatinine has been trending up, today it came back at 2.69.  Exam is difficult for volume though he reports significant dyspneic symptoms.  [employer] Heartlogic is 0, not suggesting significant volume overload and BNP is only 93.  REDS clip device is not working today.  - Keep Lasix at 40 mg daily for now, do not increase today.  - Continue Farxiga 10 mg daily.  - Continue Coreg 25 mg bid.      - Continue hydralazine to 100 mg tid + Imdur 60 mg daily. - Off Entresto and spironolactone with elevated creatinine.  - I think that he needs RHC to get a better handle on volume status (significant  symptoms, difficult exam, Heartlogic and BNP unremarkable).  Will arrange this for next week.  Discussed risks/benefits, patient agrees to procedure.  He will hold Eliquis day before and day of.  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  2. Type II diabetes: management per PCP. Continue Lantus. He does not check his blood sugars at home. - Continue Farxiga. 3. HTN: BP controlled today. 4. Deafness: Sign language interpreter present at visit.  5. CKD: Stage 3.  Creatinine 2.69 today.  - Continue Wilder Glade for now.  - Refer to nephrology.  6. PE/DVT: 4/21.  Unclear cause, no prolonged trip, prolonged immobility, or history of malignancy. No FH clotting. Recurrent PE in 10/21 while Eliquis on hold with bleeding from esophagitis/gastritis.  Do no think this was an Eliquis failure.  - Continue apixaban, think he will need long-term with no clear trigger for VTE. No bleeding issues.  7. Hyperlipidemia: Continue atorvastatin, good lipids 1/22.  8. Atrial fibrillation: First noted in setting of DKA in 10/21.  He is now on amiodarone and is in NSR today.  - Continue amiodarone 100 mg daily. Recent TSH mildly  elevated, T3 & T4 ok.  Check LFTs today. He will need a regular eye exam.  - Not ideal to continue amiodarone long-term, will likely stop in the future if no further AF and arrange for ablation if it recurs.  - Continue Eliquis.  9. ETOH abuse: ETOH may have played a role in both cardiomyopathy and atrial fibrillation.  Not drinking currently. 10. Snoring: Has had sleep study years ago in [place], patient thinks he was diagnosed with OSA. Paramedicine says he falls asleep during visits occasionally, and snores. - ordered.  11. Noncompliance: Significant problems with noncompliance around medication use.   - Paramedicine now following, appreciate their assistance.  Followup in 3 wks with NP/PA.   Loralie Champagne, MD  05/17/2020

## 2020-05-18 ENCOUNTER — Telehealth (HOSPITAL_COMMUNITY): Payer: Self-pay | Admitting: Licensed Clinical Social Worker

## 2020-05-18 ENCOUNTER — Other Ambulatory Visit (HOSPITAL_COMMUNITY): Payer: Self-pay

## 2020-05-18 MED ORDER — AMIODARONE HCL 200 MG PO TABS: 200.0000 mg | ORAL_TABLET | Freq: Every day | ORAL | 2 refills | Status: DC

## 2020-05-18 NOTE — Telephone Encounter (Signed)
Paramedic informed CSW that pt has heart cath 2/10 and is confused about where and when his pre preprocedure COVID testing is taking place.  No COVID test on pt schedule so CSW messaged RN/CMA staff to inquire.  Appt was never scheduled- able to get on scheduled for tomorrow.  CSW scheduled transport for drive-thru test tomorrow  Will continue to follow and assist as needed  Jorge Ny, [facility] Desk#: [phone] Cell#: [phone]

## 2020-05-19 ENCOUNTER — Other Ambulatory Visit (HOSPITAL_COMMUNITY): Payer: Self-pay | Admitting: *Deleted

## 2020-05-19 ENCOUNTER — Other Ambulatory Visit (HOSPITAL_COMMUNITY)
Admission: RE | Admit: 2020-05-19 | Discharge: 2020-05-19 | Disposition: A | Discharge status: Home / Self Care | Payer: Medicare Other | Source: Ambulatory Visit | Attending: Cardiology | Admitting: Cardiology

## 2020-05-19 ENCOUNTER — Other Ambulatory Visit (HOSPITAL_COMMUNITY): Payer: Self-pay

## 2020-05-19 DIAGNOSIS — Z20822 Contact with and (suspected) exposure to covid-19: Secondary | ICD-10-CM | POA: Diagnosis not present

## 2020-05-19 DIAGNOSIS — Z01812 Encounter for preprocedural laboratory examination: Secondary | ICD-10-CM | POA: Diagnosis not present

## 2020-05-19 LAB — SARS CORONAVIRUS 2 (TAT 6-24 HRS): SARS Coronavirus 2: NEGATIVE

## 2020-05-19 MED ORDER — AMIODARONE HCL 200 MG PO TABS: 100.0000 mg | ORAL_TABLET | Freq: Every day | ORAL | 2 refills | Status: DC

## 2020-05-19 NOTE — Progress Notes (Signed)
Paramedicine Encounter    Patient ID: Troy Rush, male    DOB: 08-Mar-1971, 50 y.o.   MRN: [id number]   Patient Care Team: [name], FNP as PCP - General (Family Medicine) Larey Dresser, MD as PCP - Cardiology (Cardiology) Conrad [place], NP as Nurse Practitioner (Cardiology) Larey Dresser, MD as Consulting Physician (Cardiology)  Patient Active Problem List   Diagnosis Date Noted  . ICD (implantable cardioverter-defibrillator) in place 05/14/2020  . Pulmonary embolism ([place]) 02/05/2020  . Acute kidney injury superimposed on CKD ([place])   . Chronic combined systolic and diastolic heart failure ([place])   . Atrial fibrillation with RVR ([place]) 02/01/2020  . NSVT (nonsustained ventricular tachycardia) ([place]) 02/01/2020  . Acute GI bleeding 02/01/2020  . DKA (diabetic ketoacidosis) ([place]) 02/01/2020  . Bilateral deafness 08/15/2019  . Type 2 diabetes mellitus with complication, with long-term current use of insulin ([place]) 08/15/2019  . Hemoglobin A1C between 7% and 9% indicating borderline diabetic control 08/15/2019  . Insomnia 08/15/2019  . Abdominal pain 11/14/2018  . Ischemic cardiomyopathy 10/14/2016  . NICM (nonischemic cardiomyopathy) ([place]) 09/15/2016  . Chronic systolic CHF (congestive heart failure) ([place]) 05/30/2016  . HTN (hypertension) 05/30/2016  . Deaf 05/30/2016  . Hyperkalemia 05/30/2016  . Diabetes ([place]) 03/21/2016  . Major depressive disorder, recurrent episode ([place]) 03/21/2016    Current Outpatient Medications:  .  apixaban (ELIQUIS) 5 MG TABS tablet, Take 1 tablet (5 mg total) by mouth 2 (two) times daily., Disp: 60 tablet, Rfl: 3 .  atorvastatin (LIPITOR) 80 MG tablet, Take 1 tablet (80 mg total) by mouth at bedtime., Disp: 30 tablet, Rfl: 11 .  carvedilol (COREG) 25 MG tablet, Take 1 tablet (25 mg total) by mouth 2 (two) times daily with a meal., Disp: 60 tablet, Rfl: 6 .  dapagliflozin propanediol (FARXIGA) 10 MG TABS tablet, Take 1 tablet (10 mg total)  by mouth daily before breakfast., Disp: 30 tablet, Rfl: 11 .  fenofibrate 54 MG tablet, Take 1 tablet (54 mg total) by mouth daily., Disp: 30 tablet, Rfl: 2 .  furosemide (LASIX) 40 MG tablet, Take 1 tablet (40 mg total) by mouth in the morning AND 0.5 tablets (20 mg total) every evening., Disp: 90 tablet, Rfl: 11 .  hydrALAZINE (APRESOLINE) 100 MG tablet, Take 1 tablet (100 mg total) by mouth 3 (three) times daily., Disp: 90 tablet, Rfl: 3 .  icosapent Ethyl (VASCEPA) 1 g capsule, Take 2 capsules (2 g total) by mouth 2 (two) times daily., Disp: 120 capsule, Rfl: 5 .  insulin glargine (LANTUS) 100 UNIT/ML injection, Inject 0.24 mLs (24 Units total) into the skin daily., Disp: 10 mL, Rfl: 11 .  isosorbide mononitrate (IMDUR) 60 MG 24 hr tablet, Take 1 tablet (60 mg total) by mouth daily., Disp: 30 tablet, Rfl: 11 .  Lancets MISC, 1 each by Does not apply route 3 (three) times daily as needed., Disp: 300 each, Rfl: 3 .  latanoprost (XALATAN) 0.005 % ophthalmic solution, Place 1 drop into both eyes at bedtime., Disp: , Rfl:  .  pantoprazole (PROTONIX) 40 MG tablet, Take 1 tablet (40 mg total) by mouth 2 (two) times daily., Disp: 180 tablet, Rfl: 3 .  amiodarone (PACERONE) 200 MG tablet, Take 0.5 tablets (100 mg total) by mouth daily., Disp: 60 tablet, Rfl: 2 Allergies  Allergen Reactions  . Lisinopril Cough      Social History   Socioeconomic History  . Marital status: Legally Separated    Spouse name: Not  on file  . Number of children: Not on file  . Years of education: Not on file  . Highest education level: Not on file  Occupational History  . Not on file  Tobacco Use  . Smoking status: Never Smoker  . Smokeless tobacco: Never Used  Vaping Use  . Vaping Use: Never used  Substance and Sexual Activity  . Alcohol use: Yes    Alcohol/week: 1.0 - 2.0 standard drink    Types: 1 - 2 Glasses of wine per week    Comment: rare  . Drug use: No  . Sexual activity: Yes  Other Topics  Concern  . Not on file  Social History Narrative  . Not on file   Social Determinants of Health   Financial Resource Strain: Medium Risk  . Difficulty of Paying Living Expenses: Somewhat hard  Food Insecurity: No Food Insecurity  . Worried About Charity fundraiser in the Last Year: Never true  . Ran Out of Food in the Last Year: Never true  Transportation Needs: No Transportation Needs  . Lack of Transportation (Medical): No  . Lack of Transportation (Non-Medical): No  Physical Activity: Not on file  Stress: Not on file  Social Connections: Not on file  Intimate Partner Violence: Not on file    Physical Exam      Future Appointments  Date Time Provider [place]  05/19/2020 11:20 AM MC-SCREENING MC-SDSC None  05/30/2020  8:00 PM Sueanne Margarita, MD MSD-SLEEL MSD  06/05/2020  9:30 AM MC-HVSC PA/NP MC-HVSC None  06/09/2020 10:20 AM [name], FNP SCC-SCC None  06/22/2020  8:35 AM [facility] DEVICE REMOTES [facility]  07/06/2020 10:00 AM MC-HVSC PA/NP MC-HVSC None  09/21/2020  8:35 AM [facility] DEVICE REMOTES [facility]  12/21/2020  8:35 AM [facility] DEVICE REMOTES [facility]  03/22/2021  8:35 AM [facility] DEVICE REMOTES [facility]  06/21/2021  8:35 AM [facility] DEVICE REMOTES [facility]  09/20/2021  8:35 AM [facility] DEVICE REMOTES [facility]  12/20/2021  8:35 AM [facility] DEVICE REMOTES [facility]    BP (!) 164/92   Pulse 74   Resp 18   Wt 237 lb (107.5 kg)   SpO2 99%   BMI 35.00 kg/m   Weight yesterday-239 Last visit weight-247   Pt reports hes doing better, his weight is down this week. He is having heart cath done Thursday.  -pharmacy didn't bring out the farxiga-phamacy saying the insurance rejecting it due to it was filled on 1/31 somewhere-but the only bottle he has is from 1/6 from [facility].  meds verified and pill box refilled.  I  advised him to ensure he takes meds on the correct day of the week since it has some changes it in for his heart cath on Thursday. He understood.     [name], [facility] Paramedic  05/19/20

## 2020-05-20 NOTE — Progress Notes (Signed)
Called patient regarding cath instructions for tomorrow.  Sign interpreter through phone service helped relay the information.  Nothing to eat or drink after midnight,  He needs a responsible adult to drive him home as well as stay with him for 1st 24 hours.  No Eliquis today or tomorrow.  No Lasix in the morning.  Told him we will set up a sign interpreter for him tomorrow.

## 2020-05-21 ENCOUNTER — Encounter (HOSPITAL_COMMUNITY)
Admission: RE | Disposition: A | Discharge status: Home / Self Care | Payer: Self-pay | Source: Home / Self Care | Attending: Cardiology

## 2020-05-21 ENCOUNTER — Encounter (HOSPITAL_COMMUNITY): Payer: Self-pay | Admitting: Cardiology

## 2020-05-21 ENCOUNTER — Other Ambulatory Visit: Payer: Self-pay

## 2020-05-21 ENCOUNTER — Ambulatory Visit (HOSPITAL_COMMUNITY)
Admission: RE | Admit: 2020-05-21 | Discharge: 2020-05-21 | Disposition: A | Discharge status: Home / Self Care | Payer: Medicare Other | Attending: Cardiology | Admitting: Cardiology

## 2020-05-21 DIAGNOSIS — I130 Hypertensive heart and chronic kidney disease with heart failure and stage 1 through stage 4 chronic kidney disease, or unspecified chronic kidney disease: Secondary | ICD-10-CM | POA: Insufficient documentation

## 2020-05-21 DIAGNOSIS — Z9114 Patient's other noncompliance with medication regimen: Secondary | ICD-10-CM | POA: Insufficient documentation

## 2020-05-21 DIAGNOSIS — Z86718 Personal history of other venous thrombosis and embolism: Secondary | ICD-10-CM | POA: Insufficient documentation

## 2020-05-21 DIAGNOSIS — E1122 Type 2 diabetes mellitus with diabetic chronic kidney disease: Secondary | ICD-10-CM | POA: Diagnosis not present

## 2020-05-21 DIAGNOSIS — Z95810 Presence of automatic (implantable) cardiac defibrillator: Secondary | ICD-10-CM | POA: Diagnosis not present

## 2020-05-21 DIAGNOSIS — N183 Chronic kidney disease, stage 3 unspecified: Secondary | ICD-10-CM | POA: Diagnosis not present

## 2020-05-21 DIAGNOSIS — Z79899 Other long term (current) drug therapy: Secondary | ICD-10-CM | POA: Insufficient documentation

## 2020-05-21 DIAGNOSIS — R0683 Snoring: Secondary | ICD-10-CM | POA: Insufficient documentation

## 2020-05-21 DIAGNOSIS — Z7901 Long term (current) use of anticoagulants: Secondary | ICD-10-CM | POA: Insufficient documentation

## 2020-05-21 DIAGNOSIS — I428 Other cardiomyopathies: Secondary | ICD-10-CM | POA: Diagnosis not present

## 2020-05-21 DIAGNOSIS — Z8719 Personal history of other diseases of the digestive system: Secondary | ICD-10-CM | POA: Diagnosis not present

## 2020-05-21 DIAGNOSIS — Z560 Unemployment, unspecified: Secondary | ICD-10-CM | POA: Diagnosis not present

## 2020-05-21 DIAGNOSIS — H9190 Unspecified hearing loss, unspecified ear: Secondary | ICD-10-CM | POA: Insufficient documentation

## 2020-05-21 DIAGNOSIS — Z86711 Personal history of pulmonary embolism: Secondary | ICD-10-CM | POA: Diagnosis not present

## 2020-05-21 DIAGNOSIS — I509 Heart failure, unspecified: Secondary | ICD-10-CM | POA: Diagnosis not present

## 2020-05-21 DIAGNOSIS — I272 Pulmonary hypertension, unspecified: Secondary | ICD-10-CM | POA: Diagnosis not present

## 2020-05-21 DIAGNOSIS — I5022 Chronic systolic (congestive) heart failure: Secondary | ICD-10-CM | POA: Diagnosis not present

## 2020-05-21 DIAGNOSIS — I480 Paroxysmal atrial fibrillation: Secondary | ICD-10-CM | POA: Diagnosis not present

## 2020-05-21 DIAGNOSIS — F1011 Alcohol abuse, in remission: Secondary | ICD-10-CM | POA: Insufficient documentation

## 2020-05-21 DIAGNOSIS — E785 Hyperlipidemia, unspecified: Secondary | ICD-10-CM | POA: Diagnosis not present

## 2020-05-21 DIAGNOSIS — Z794 Long term (current) use of insulin: Secondary | ICD-10-CM | POA: Diagnosis not present

## 2020-05-21 HISTORY — PX: RIGHT HEART CATH: CATH118263

## 2020-05-21 LAB — POCT I-STAT EG7
Acid-Base Excess: 3 mmol/L — ABNORMAL HIGH (ref 0.0–2.0)
Acid-Base Excess: 3 mmol/L — ABNORMAL HIGH (ref 0.0–2.0)
Bicarbonate: 29.8 mmol/L — ABNORMAL HIGH (ref 20.0–28.0)
Bicarbonate: 30.2 mmol/L — ABNORMAL HIGH (ref 20.0–28.0)
Calcium, Ion: 1.25 mmol/L (ref 1.15–1.40)
Calcium, Ion: 1.27 mmol/L (ref 1.15–1.40)
HCT: 33 % — ABNORMAL LOW (ref 39.0–52.0)
HCT: 33 % — ABNORMAL LOW (ref 39.0–52.0)
Hemoglobin: 11.2 g/dL — ABNORMAL LOW (ref 13.0–17.0)
Hemoglobin: 11.2 g/dL — ABNORMAL LOW (ref 13.0–17.0)
O2 Saturation: 74 %
O2 Saturation: 75 %
Potassium: 4.3 mmol/L (ref 3.5–5.1)
Potassium: 4.4 mmol/L (ref 3.5–5.1)
Sodium: 143 mmol/L (ref 135–145)
Sodium: 143 mmol/L (ref 135–145)
TCO2: 32 mmol/L (ref 22–32)
TCO2: 32 mmol/L (ref 22–32)
pCO2, Ven: 57.6 mmHg (ref 44.0–60.0)
pCO2, Ven: 57.7 mmHg (ref 44.0–60.0)
pH, Ven: 7.321 (ref 7.250–7.430)
pH, Ven: 7.327 (ref 7.250–7.430)
pO2, Ven: 43 mmHg (ref 32.0–45.0)
pO2, Ven: 44 mmHg (ref 32.0–45.0)

## 2020-05-21 LAB — GLUCOSE, CAPILLARY
Glucose-Capillary: 328 mg/dL — ABNORMAL HIGH (ref 70–99)
Glucose-Capillary: 236 mg/dL — ABNORMAL HIGH (ref 70–99)

## 2020-05-21 SURGERY — RIGHT HEART CATH
Anesthesia: LOCAL

## 2020-05-21 MED ORDER — LIDOCAINE HCL (PF) 1 % IJ SOLN
INTRAMUSCULAR | Status: AC
  Filled 2020-05-21: qty 30

## 2020-05-21 MED ORDER — SODIUM CHLORIDE 0.9 % IV SOLN: 250.0000 mL | INTRAVENOUS | Status: DC | PRN

## 2020-05-21 MED ORDER — ASPIRIN 81 MG PO CHEW
81.0000 mg | CHEWABLE_TABLET | ORAL | Status: AC
  Administered 2020-05-21: 81 mg via ORAL
  Filled 2020-05-21: qty 1

## 2020-05-21 MED ORDER — LABETALOL HCL 5 MG/ML IV SOLN
INTRAVENOUS | Status: AC
  Filled 2020-05-21: qty 4

## 2020-05-21 MED ORDER — LIDOCAINE HCL (PF) 1 % IJ SOLN
INTRAMUSCULAR | Status: DC | PRN
  Administered 2020-05-21: 6 mL

## 2020-05-21 MED ORDER — HYDRALAZINE HCL 20 MG/ML IJ SOLN
INTRAMUSCULAR | Status: AC
  Filled 2020-05-21: qty 1

## 2020-05-21 MED ORDER — MIDAZOLAM HCL 2 MG/2ML IJ SOLN
INTRAMUSCULAR | Status: DC | PRN
  Administered 2020-05-21: 1 mg via INTRAVENOUS

## 2020-05-21 MED ORDER — SODIUM CHLORIDE 0.9 % IV SOLN: INTRAVENOUS | Status: DC

## 2020-05-21 MED ORDER — HYDRALAZINE HCL 20 MG/ML IJ SOLN
10.0000 mg | INTRAMUSCULAR | Status: DC | PRN
  Administered 2020-05-21 (×2): 10 mg via INTRAVENOUS

## 2020-05-21 MED ORDER — MIDAZOLAM HCL 2 MG/2ML IJ SOLN
INTRAMUSCULAR | Status: AC
  Filled 2020-05-21: qty 2

## 2020-05-21 MED ORDER — HEPARIN (PORCINE) IN NACL 1000-0.9 UT/500ML-% IV SOLN
INTRAVENOUS | Status: DC | PRN
  Administered 2020-05-21 (×2): 500 mL

## 2020-05-21 MED ORDER — FENTANYL CITRATE (PF) 100 MCG/2ML IJ SOLN
INTRAMUSCULAR | Status: DC | PRN
  Administered 2020-05-21: 25 ug via INTRAVENOUS

## 2020-05-21 MED ORDER — LABETALOL HCL 5 MG/ML IV SOLN
10.0000 mg | INTRAVENOUS | Status: DC | PRN
  Administered 2020-05-21: 10 mg via INTRAVENOUS

## 2020-05-21 MED ORDER — SODIUM CHLORIDE 0.9 % IV SOLN
250.0000 mL | INTRAVENOUS | Status: DC | PRN
Start: 2020-05-21 — End: 2020-05-21

## 2020-05-21 MED ORDER — HEPARIN (PORCINE) IN NACL 1000-0.9 UT/500ML-% IV SOLN
INTRAVENOUS | Status: AC
  Filled 2020-05-21: qty 500

## 2020-05-21 MED ORDER — SODIUM CHLORIDE 0.9% FLUSH: 3.0000 mL | Freq: Two times a day (BID) | INTRAVENOUS | Status: DC

## 2020-05-21 MED ORDER — SODIUM CHLORIDE 0.9% FLUSH: 3.0000 mL | INTRAVENOUS | Status: DC | PRN

## 2020-05-21 MED ORDER — FENTANYL CITRATE (PF) 100 MCG/2ML IJ SOLN
INTRAMUSCULAR | Status: AC
  Filled 2020-05-21: qty 2

## 2020-05-21 SURGICAL SUPPLY — 10 items
BAG SNAP BAND KOVER 36X36 (MISCELLANEOUS) ×2 IMPLANT
CATH SWAN GANZ 7F STRAIGHT (CATHETERS) ×2 IMPLANT
COVER DOME SNAP 22 D (MISCELLANEOUS) ×2 IMPLANT
KIT HEART LEFT (KITS) ×2 IMPLANT
PACK CARDIAC CATHETERIZATION (CUSTOM PROCEDURE TRAY) ×2 IMPLANT
SHEATH GLIDE SLENDER 4/5FR (SHEATH) ×2 IMPLANT
SHEATH PINNACLE 7F 10CM (SHEATH) ×2 IMPLANT
TRANSDUCER W/STOPCOCK (MISCELLANEOUS) ×2 IMPLANT
TUBING ART PRESS 72  MALE/FEM (TUBING) ×1
TUBING ART PRESS 72 MALE/FEM (TUBING) ×1 IMPLANT

## 2020-05-21 NOTE — Interval H&P Note (Signed)
History and Physical Interval Note:  05/21/2020 8:48 AM  Troy Rush  has presented today for surgery, with the diagnosis of congestive heart failure.  The various methods of treatment have been discussed with the patient and family. After consideration of risks, benefits and other options for treatment, the patient has consented to  Procedure(s): RIGHT HEART CATH (N/A) as a surgical intervention.  The patient's history has been reviewed, patient examined, no change in status, stable for surgery.  I have reviewed the patient's chart and labs.  Questions were answered to the patient's satisfaction.     [place] [employer]

## 2020-05-21 NOTE — Progress Notes (Signed)
Inpatient Diabetes Program Recommendations  AACE/ADA: New Consensus Statement on Inpatient Glycemic Control (2015)  Target Ranges:  Prepandial:   less than 140 mg/dL      Peak postprandial:   less than 180 mg/dL (1-2 hours)      Critically ill patients:  140 - 180 mg/dL   Lab Results  Component Value Date   GLUCAP 328 (H) 05/21/2020   HGBA1C 7.9 (A) 03/11/2020   HGBA1C 7.8 03/11/2020   HGBA1C 7.8 (A) 03/11/2020   HGBA1C 7.8 (A) 03/11/2020    Review of Glycemic Control  Diabetes history: DM 2 Outpatient Diabetes medications: Lantus 24 units Daily, Farxiga 10 mg Daily Current orders for Inpatient glycemic control:  None   Inpatient Diabetes Program Recommendations:    Glucose this am 328. Pt currently in the cath lab. If pt is admitted:  - Add Novolog 0-15 units tid + hs - Lantus 24 units  Thanks,  Tama Headings RN, MSN, BC-ADM Inpatient Diabetes Coordinator Team Pager [phone] (8a-5p)

## 2020-05-21 NOTE — Progress Notes (Signed)
Site area: right groin  Site Prior to Removal:  Level 0  Pressure Applied For 13 MINUTES    Minutes Beginning at 1030  Manual:   Yes.    Patient Status During Pull:  Stable  Post Pull Groin Site:  Level 0   [id number]}  Post Pull Instructions Given:  Yes.    Post Pull Pulses Present:  Yes.    Dressing Applied:  Yes.    Comments:  Bed rest started at 1045 X 2 hr.

## 2020-05-21 NOTE — Discharge Instructions (Signed)
Femoral Site Care This sheet gives you information about how to care for yourself after your procedure. Your health care provider may also give you more specific instructions. If you have problems or questions, contact your health care provider. What can I expect after the procedure? After the procedure, it is common to have:  Bruising that usually fades within 1-2 weeks.  Tenderness at the site.  Medications: Resume all previous home medications.  Wound care 1. May remove bandage after 24 hours. 2. Do not take baths, swim, or use a hot tub for 5 days. 3. You may shower 24-48 hours after the procedure. ? Gently wash the site with plain soap and water. ? Pat the area dry with a clean towel. ? Do not rub the site. This may cause bleeding. 4. Do not apply powder or lotion to the site. Keep the site clean and dry. 5. Check your femoral site every day for signs of infection. Check for: ? Redness, swelling, or pain. ? Fluid or blood. ? Warmth. ? Pus or a bad smell.  Activity 1. For the first 2-3 days after your procedure, or as long as directed: ? Avoid climbing stairs as much as possible. ? Do not squat. 2. Do not lift, push or pull anything that is heavier than 10 lb for 5 days. 3. Rest as directed. ? Avoid sitting for a long time without moving. Get up to take short walks every 1-2 hours. 4. Do not drive for 24 hours.  General instructions  Take over-the-counter and prescription medicines only as told by your health care provider.  Keep all follow-up visits as told by your health care provider. This is important.  DRINK PLENTY OF FLUIDS FOR THE NEXT 2-3 DAYS.  Contact a health care provider if you have:  A fever or chills.  You have redness, swelling, or pain around your insertion site.  Get help right away if:  If the catheter insertion area is bleeding seek help from your friend. He will hold pressure to stop the bleeding and if the bleeding does not stop, please call  911.  The catheter insertion area swells very fast.  You pass out.  You suddenly start to sweat or your skin gets clammy.  The area near or just beyond the catheter insertion site becomes pale, cool, tingly, or numb. These symptoms may represent a serious problem that is an emergency. Do not wait to see if the symptoms will go away. Get medical help right away. Call your local emergency services (911 in the [place] Do not drive yourself to the hospital.  Summary  After the procedure, it is common to have bruising that usually fades within 1-2 weeks.  Check your femoral site every day for signs of infection.  Do not lift, push or pull anything that is heavier than 10 lb for 5 days.  This information is not intended to replace advice given to you by your health care provider. Make sure you discuss any questions you have with your health care provider. Document Revised: 04/10/2017 Document Reviewed: 04/10/2017 Elsevier Patient Education  2020 [employer].

## 2020-05-25 ENCOUNTER — Other Ambulatory Visit (HOSPITAL_COMMUNITY): Payer: Medicare Other

## 2020-05-25 ENCOUNTER — Encounter (HOSPITAL_COMMUNITY): Payer: Medicare Other

## 2020-05-26 ENCOUNTER — Other Ambulatory Visit (HOSPITAL_COMMUNITY): Payer: Self-pay

## 2020-05-26 NOTE — Progress Notes (Signed)
Paramedicine Encounter    Patient ID: Troy Rush, male    DOB: 1970/04/20, 50 y.o.   MRN: [id number]   Patient Care Team: [name], FNP as PCP - General (Family Medicine) Larey Dresser, MD as PCP - Cardiology (Cardiology) Conrad [place], NP as Nurse Practitioner (Cardiology) Larey Dresser, MD as Consulting Physician (Cardiology)  Patient Active Problem List   Diagnosis Date Noted  . ICD (implantable cardioverter-defibrillator) in place 05/14/2020  . Pulmonary embolism ([place]) 02/05/2020  . Acute kidney injury superimposed on CKD ([place])   . Chronic combined systolic and diastolic heart failure ([place])   . Atrial fibrillation with RVR ([place]) 02/01/2020  . NSVT (nonsustained ventricular tachycardia) ([place]) 02/01/2020  . Acute GI bleeding 02/01/2020  . DKA (diabetic ketoacidosis) ([place]) 02/01/2020  . Bilateral deafness 08/15/2019  . Type 2 diabetes mellitus with complication, with long-term current use of insulin ([place]) 08/15/2019  . Hemoglobin A1C between 7% and 9% indicating borderline diabetic control 08/15/2019  . Insomnia 08/15/2019  . Abdominal pain 11/14/2018  . Ischemic cardiomyopathy 10/14/2016  . NICM (nonischemic cardiomyopathy) ([place]) 09/15/2016  . Chronic systolic CHF (congestive heart failure) ([place]) 05/30/2016  . HTN (hypertension) 05/30/2016  . Deaf 05/30/2016  . Hyperkalemia 05/30/2016  . Diabetes ([place]) 03/21/2016  . Major depressive disorder, recurrent episode ([place]) 03/21/2016    Current Outpatient Medications:  .  amiodarone (PACERONE) 200 MG tablet, Take 0.5 tablets (100 mg total) by mouth daily., Disp: 60 tablet, Rfl: 2 .  apixaban (ELIQUIS) 5 MG TABS tablet, Take 1 tablet (5 mg total) by mouth 2 (two) times daily., Disp: 60 tablet, Rfl: 3 .  atorvastatin (LIPITOR) 80 MG tablet, Take 1 tablet (80 mg total) by mouth at bedtime., Disp: 30 tablet, Rfl: 11 .  carvedilol (COREG) 25 MG tablet, Take 1 tablet (25 mg total) by mouth 2 (two) times daily with a  meal., Disp: 60 tablet, Rfl: 6 .  dapagliflozin propanediol (FARXIGA) 10 MG TABS tablet, Take 1 tablet (10 mg total) by mouth daily before breakfast., Disp: 30 tablet, Rfl: 11 .  fenofibrate 54 MG tablet, Take 1 tablet (54 mg total) by mouth daily., Disp: 30 tablet, Rfl: 2 .  furosemide (LASIX) 40 MG tablet, Take 1 tablet (40 mg total) by mouth in the morning AND 0.5 tablets (20 mg total) every evening. (Patient taking differently: Take '40mg'$  daily.), Disp: 90 tablet, Rfl: 11 .  hydrALAZINE (APRESOLINE) 100 MG tablet, Take 1 tablet (100 mg total) by mouth 3 (three) times daily., Disp: 90 tablet, Rfl: 3 .  icosapent Ethyl (VASCEPA) 1 g capsule, Take 2 capsules (2 g total) by mouth 2 (two) times daily., Disp: 120 capsule, Rfl: 5 .  insulin glargine (LANTUS) 100 UNIT/ML injection, Inject 0.24 mLs (24 Units total) into the skin daily., Disp: 10 mL, Rfl: 11 .  isosorbide mononitrate (IMDUR) 60 MG 24 hr tablet, Take 1 tablet (60 mg total) by mouth daily., Disp: 30 tablet, Rfl: 11 .  Lancets MISC, 1 each by Does not apply route 3 (three) times daily as needed., Disp: 300 each, Rfl: 3 .  latanoprost (XALATAN) 0.005 % ophthalmic solution, Place 1 drop into both eyes at bedtime., Disp: , Rfl:  .  pantoprazole (PROTONIX) 40 MG tablet, Take 1 tablet (40 mg total) by mouth 2 (two) times daily., Disp: 180 tablet, Rfl: 3 Allergies  Allergen Reactions  . Lisinopril Cough      Social History   Socioeconomic History  . Marital status: Legally Separated  Spouse name: Not on file  . Number of children: Not on file  . Years of education: Not on file  . Highest education level: Not on file  Occupational History  . Not on file  Tobacco Use  . Smoking status: Never Smoker  . Smokeless tobacco: Never Used  Vaping Use  . Vaping Use: Never used  Substance and Sexual Activity  . Alcohol use: Yes    Alcohol/week: 1.0 - 2.0 standard drink    Types: 1 - 2 Glasses of wine per week    Comment: rare  . Drug  use: No  . Sexual activity: Yes  Other Topics Concern  . Not on file  Social History Narrative  . Not on file   Social Determinants of Health   Financial Resource Strain: Medium Risk  . Difficulty of Paying Living Expenses: Somewhat hard  Food Insecurity: No Food Insecurity  . Worried About Charity fundraiser in the Last Year: Never true  . Ran Out of Food in the Last Year: Never true  Transportation Needs: No Transportation Needs  . Lack of Transportation (Medical): No  . Lack of Transportation (Non-Medical): No  Physical Activity: Not on file  Stress: Not on file  Social Connections: Not on file  Intimate Partner Violence: Not on file    Physical Exam      Future Appointments  Date Time Provider [place]  05/30/2020  8:00 PM Sueanne Margarita, MD MSD-SLEEL MSD  06/05/2020  9:30 AM MC-HVSC PA/NP MC-HVSC None  06/09/2020 10:20 AM [name], FNP SCC-SCC None  06/22/2020  8:35 AM CVD[facility] DEVICE REMOTES [facility]  07/06/2020 10:00 AM MC-HVSC PA/NP MC-HVSC None  09/21/2020  8:35 AM [facility] DEVICE REMOTES [facility]  12/21/2020  8:35 AM CVD-[facility] DEVICE REMOTES [facility]  03/22/2021  8:35 AM CVD-[facility] DEVICE REMOTES [facility]  06/21/2021  8:35 AM CVD-[facility] DEVICE REMOTES [facility]  09/20/2021  8:35 AM CVD[facility] DEVICE REMOTES [facility]  12/20/2021  8:35 AM CVD[facility] DEVICE REMOTES [facility]    BP (!) 158/90   Pulse 68   Resp 18   Wt 244 lb (110.7 kg)   SpO2 99%   BMI 35.01 kg/m   Weight yesterday-? Last visit weight-237  Pt reports doing ok, he denies dizziness, just a little sob, he had heart cath done last week, has sleep study this wknd and goes back to heart clinic next Friday. His weight is going back up.   I am not going to be here next Tuesday so I filled up 2 pill boxes for him.  meds verified- Refills needed--fenofibrate,  hydralazine, vascepa, isosorbide?    [name], [facility] Paramedic  05/26/20

## 2020-05-30 ENCOUNTER — Ambulatory Visit (HOSPITAL_BASED_OUTPATIENT_CLINIC_OR_DEPARTMENT_OTHER): Payer: Medicare Other | Attending: Family Medicine | Admitting: Cardiology

## 2020-05-30 ENCOUNTER — Other Ambulatory Visit: Payer: Self-pay

## 2020-05-30 DIAGNOSIS — G4734 Idiopathic sleep related nonobstructive alveolar hypoventilation: Secondary | ICD-10-CM | POA: Diagnosis not present

## 2020-05-30 DIAGNOSIS — R0902 Hypoxemia: Secondary | ICD-10-CM | POA: Insufficient documentation

## 2020-05-30 DIAGNOSIS — G4731 Primary central sleep apnea: Secondary | ICD-10-CM

## 2020-05-30 DIAGNOSIS — G4733 Obstructive sleep apnea (adult) (pediatric): Secondary | ICD-10-CM

## 2020-05-30 DIAGNOSIS — G4736 Sleep related hypoventilation in conditions classified elsewhere: Secondary | ICD-10-CM | POA: Insufficient documentation

## 2020-05-30 DIAGNOSIS — R0683 Snoring: Secondary | ICD-10-CM | POA: Diagnosis present

## 2020-05-30 DIAGNOSIS — I5042 Chronic combined systolic (congestive) and diastolic (congestive) heart failure: Secondary | ICD-10-CM

## 2020-06-02 NOTE — Procedures (Signed)
   Patient Name: Troy Rush, Troy Rush Date: 05/30/2020 Gender: Male D.O.B: 04-Aug-1970 Age (years): 39 Referring Provider: Allena Katz FNP Height (inches): 39 Interpreting Physician: Fransico Him MD, ABSM Weight (lbs): 240 RPSGT: Jorge Ny BMI: 34 MRN: [id number] Neck Size: 18.00  CLINICAL INFORMATION Sleep Study Type: NPSG  Indication for sleep study: Congestive Heart Failure, Daytime Fatigue, Depression, Diabetes, Fatigue, Hypertension, Insomnia, Nocturnal Gasping, Non-refreshing Sleep, Obesity, Snoring  Epworth Sleepiness Score: 9  SLEEP STUDY TECHNIQUE As per the AASM Manual for the Scoring of Sleep and Associated Events v2.3 (April 2016) with a hypopnea requiring 4% desaturations.  The channels recorded and monitored were frontal, central and occipital EEG, electrooculogram (EOG), submentalis EMG (chin), nasal and oral airflow, thoracic and abdominal wall motion, anterior tibialis EMG, snore microphone, electrocardiogram, and pulse oximetry.  MEDICATIONS Medications self-administered by patient taken the night of the study : N/A  SLEEP ARCHITECTURE The study was initiated at 9:54:47 PM and ended at 4:42:27 AM.  Sleep onset time was 7.9 minutes and the sleep efficiency was 46.5%. The total sleep time was 189.5 minutes.  Stage REM latency was N/A minutes.  The patient spent 25.33% of the night in stage N1 sleep, 74.67% in stage N2 sleep, 0.00% in stage N3 and 0% in REM.  Alpha intrusion was absent.  Supine sleep was 30.79%.  RESPIRATORY PARAMETERS The overall apnea/hypopnea index (AHI) was 125.7 per hour. There were 173 total apneas, including 78 obstructive, 87 central and 8 mixed apneas. There were 224 hypopneas and 2 RERAs.  The AHI during Stage REM sleep was N/A per hour.  AHI while supine was 127.5 per hour.  The mean oxygen saturation was 87.30%. The minimum SpO2 during sleep was 51.00%.  loud snoring was noted during this study.  CARDIAC DATA The  2 lead EKG demonstrated  NSR. The mean heart rate was 83.55 beats per minute.   LEG MOVEMENT DATA The total PLMS were 0 with a resulting PLMS index of 0.00. Associated arousal with leg movement index was 0.0 .  IMPRESSIONS - Severe obstructive sleep apnea occurred during this study (AHI = 125.7/h). - Moderate central sleep apnea occurred during this study (CAI = 27.5/h). - Severe oxygen desaturation was noted during this study (Min O2 = 51.00%). - The patient snored with loud snoring volume. - No cardiac abnormalities were noted during this study. - Clinically significant periodic limb movements did not occur during sleep. No significant associated arousals.  DIAGNOSIS - Obstructive Sleep Apnea (G47.33) - Central Sleep Apnea (G47.37) - Nocturnal Hypoxemia (G47.36)  RECOMMENDATIONS - CPAP titration to determine optimal pressure required to alleviate sleep disordered breathing. BiPAP or ASV titration may be required to eliminate central sleep apnea. - Avoid alcohol, sedatives and other CNS depressants that may worsen sleep apnea and disrupt normal sleep architecture. - Sleep hygiene should be reviewed to assess factors that may improve sleep quality. - Weight management and regular exercise should be initiated or continued if appropriate.  [Electronically signed] 06/02/2020 11:24 AM  Fransico Him MD, ABSM Diplomate, American Board of Sleep Medicine

## 2020-06-04 NOTE — Progress Notes (Signed)
Advanced Heart Failure Clinic Note   PCP: Kathe Becton, FNP HF Cardiology: Dr. Aundra Dubin  50 y.o. with deafness, HTN, DM, and chronic systolic CHF presents for followup of CHF.  Patient had no known cardiac problems until 10/16.  At that time, he developed exertional dyspnea and was admitted to a hospital in [place], [place] with acute systolic CHF.  Echo 10/16 showed EF 15-20%.  Cardiac cath showed no significant coronary disease.  He was diuresed and started on cardiac meds. Subsequently, he moved to [facility].  Now working in a [employer].  Repeat echo in 5/18 showed persistently low EF, 25-30%.  He had [place] placed.   He was admitted briefly with CHF exacerbation in 8/20.   Echo in 9/20 showed EF 30-35% with diffuse hypokinesis.   Ultrasound in 4/21 showed right leg DVT and CT showed a PE.  No obvious trigger (no long trip, no surgery, no prolonged immobility).  He was started on apixaban.   He quit taking his meds in 8/21 and was drinking heavily due to depression, later restarted meds.  In 10/21, he was admitted with DKA and atrial fibrillation/RVR (1st known episode).  He converted to NSR spontaneously.  [name] was stopped due to AKI.  He had melena/hematochezia and EGD showed gastritis/esophagitis.  Apixaban was stopped.  He went home but then was re-admitted later in 10/21 with bilateral PEs.   Echo in 10/21 showed EF 45%, mild global hypokinesis, moderate LVH, mildly decreased RV systolic function.   OV on 02/13/20 and was found to be fluid overloaded. Entresto was restarted at 24-26 mg bid and Lasix increased to 40 mg bid.  Follow up on 02/20/20 fluid status better, no medication changes made due to soft BP.  He returned on 1/22 for HF follow up. Gets SOB with walking upstairs. Wilder Glade was started and hydralazine increased to 100 mg tid this visit.  [name] was stopped due to elevated creatinine.   On 05/05/20, he had a near-syncopal event and went to the ER,  no events on ICD interrogation.  ?Orthostatic.  No further dizziness/lightheadedness. He is working at [employer] in [employer].  Able to do his job without dyspnea, but he gets short of breath after walking about 50 feet.  Sleepy during the day. Sleep study ordered.  Repeat RHC (2/22) showed ptimized filling pressures, mild pulmonary hypertension, preserved cardiac output. Suspect elevated PA pressure may be related to OSA.    Today he returns for HF follow up. Completed sleep study and has severe OSA & moderate central sleep apnea. Overall feeling fine. Breathing is at baseline. Denies increasing SOB, CP, dizziness, edema, or PND/Orthopnea. Appetite ok. No fever or chills. Weight at home ~239-241 pounds. Taking all medications. Followed by Paramedicine and awaiting follow up appt with Dr. Radford Pax for cpap titration.  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  Labs (9/17): BNP 477, K 4.5 => 4.2, creatinine 0.92 => 1.1, BNP 1009, HIV negative, SPEP negative.  Labs (10/17): K 5.2, creatinine 1.10, hgb 12.5 Labs (03/28/2016): K 5.3 Creatinine 1.07  Labs (3/18): digoxin < 0.2, K 4.1, creatinine 0.97 Labs (9/18): hgb 12.2, K 4, creatinine 0.99, BNP 139 Labs (1/19): hgb 11.2, K 4.8, creatinine 1.26 Labs (8/20): K 3.7, creatinine 1.69 Labs (5/21): K 5, creatinine 1.4, LDL 192 Labs (10/21): K 4.1, creatinine 1.77, LDL 180, TGs 138 Labs (11/21): K 4.5, creatinine 2.68 Labs (12/21): K 5.1, creatinine 2.25 Labs (  04/30/20): K 4.7, creatinine 2.7 Labs (05/05/20): K 4.0, creatinine 2.15 Labs (05/11/20): K 4.2, creatinine 2.43, LDL 76, HDL 38, TGs 220 Labs (2/22): K 4.4, creatinine 2.69  PMH: 1. HTN 2. Diabetes 3. Hyperlipidemia 4. Deafness since age 61: Needs sign language interpreter.  5. Chronic systolic CHF: Nonischemic cardiomyopathy.  Diagnosis 10/16 in [place], [place] (admitted with acute systolic CHF).  SPEP and  HIV negative.  [place].  - Echo (10/16): EF 15-20%, mild LVH, mild MR, normal RV size and systolic function.  - LHC (10/16): No significant CAD.  - CPX (12/17): Mild functional limitation.  - 04/2016 CMRI: EF 27%. RV normal. No evidence of infiltrative disease, myocarditis, or MI.  - Echo (5/18): EF 25-30%, mild LV dilation, mild LVH, normal RV size with mildly decreased - Echo (9/20): EF 30-35%, mild LVH, diffuse hypokinesis, normal RV size and systolic function.  - Echo (10/21): EF 45%, mild global hypokinesis, moderate LVH, mildly decreased RV systolic function.  - RHC (2/22): Optimized filling pressures, mild pulmonary hypertension, preserved cardiac output. Continue current diuretics.  Suspect elevated PA pressure may be related to OSA.   6. Depression. 7. Colitis episodes.  8. CKD stage 3.  9. DVT/PE in 4/21: No obvious trigger.  - Recurrent PE in 10/21, had been off anticoagulation.  10. Atrial fibrillation: Paroxysmal.  11. Gastritis/esophagitis: 10/21.  12. ETOH abuse  SH: From [place] originally.  Lives in [place] until 2017, then moved to [place].  Nonsmoker.  Unemployed.  H/o ETOH abuse.    FH: No known cardiac disease.   ROS: All systems reviewed and negative except as per HPI.   Current Outpatient Medications  Medication Sig Dispense Refill  . amiodarone (PACERONE) 200 MG tablet Take 0.5 tablets (100 mg total) by mouth daily. 60 tablet 2  . apixaban (ELIQUIS) 5 MG TABS tablet Take 1 tablet (5 mg total) by mouth 2 (two) times daily. 60 tablet 3  . atorvastatin (LIPITOR) 80 MG tablet Take 1 tablet (80 mg total) by mouth at bedtime. 30 tablet 11  . carvedilol (COREG) 25 MG tablet Take 1 tablet (25 mg total) by mouth 2 (two) times daily with a meal. 60 tablet 6  . dapagliflozin propanediol (FARXIGA) 10 MG TABS tablet Take 1 tablet (10 mg total) by mouth daily before breakfast. 30 tablet 11  . fenofibrate 54 MG tablet Take 1 tablet (54 mg total) by mouth daily. 30  tablet 2  . furosemide (LASIX) 40 MG tablet Take 40 mg by mouth in the morning.    . hydrALAZINE (APRESOLINE) 100 MG tablet Take 1 tablet (100 mg total) by mouth 3 (three) times daily. 90 tablet 3  . icosapent Ethyl (VASCEPA) 1 g capsule Take 2 capsules (2 g total) by mouth 2 (two) times daily. 120 capsule 5  . insulin glargine (LANTUS) 100 UNIT/ML injection Inject 0.24 mLs (24 Units total) into the skin daily. 10 mL 11  . isosorbide mononitrate (IMDUR) 60 MG 24 hr tablet Take 1 tablet (60 mg total) by mouth daily. 30 tablet 11  . Lancets MISC 1 each by Does not apply route 3 (three) times daily as needed. 300 each 3  . latanoprost (XALATAN) 0.005 % ophthalmic solution Place 1 drop into both eyes at bedtime.    . pantoprazole (PROTONIX) 40 MG tablet Take 1 tablet (40 mg total) by mouth 2 (two) times daily. 180 tablet 3   No current facility-administered medications for this encounter.   BP (!) 164/98  Pulse 68   Wt 109.6 kg   SpO2 99%   BMI 34.67 kg/m    Wt Readings from Last 3 Encounters:  06/05/20 109.6 kg  05/30/20 108.9 kg  05/26/20 110.7 kg    PHYSICAL EXAM: General:  NAD. No resp difficulty HEENT: deaf Neck: Supple. No JVD. Carotids 2+ bilat; no bruits. No lymphadenopathy or thryomegaly appreciated. Cor: PMI nondisplaced. Regular rate & rhythm. No rubs, gallops or murmurs. Lungs: Clear Abdomen: Obese, soft, nontender, nondistended. No hepatosplenomegaly. No bruits or masses. Good bowel sounds. Extremities: No cyanosis, clubbing, rash, edema Neuro: alert & oriented x 3, cranial nerves grossly intact. Moves all 4 extremities w/o difficulty. Affect pleasant.  Assessment/Plan: 1. Chronic systolic CHF: Nonischemic cardiomyopathy, etiology uncertain. Consider prior ETOH or HTN.  He has a [place].  cMRI in 1/18 with EF 27%, RV normal, no evidence of infiltrative disease, myocarditis, or MI. Echo in 9/20 showed EF 30-35%.  Echo in 10/21 showed EF up to 45% with mild RV  dysfunction. Creatinine has been trending up. NYHA II-early III, he is not volume overloaded today, weight & symptoms stable.  [employer] Heartlogic is 0. - Continue lasix 40 mg daily.  - Continue Farxiga 10 mg daily.  - Continue Coreg 25 mg bid.      - Continue hydralazine to 100 mg tid + Imdur 60 mg daily. - Off Entresto and spironolactone with elevated creatinine.  - Repeat RHC (2/22) with optimized filling pressures, preserved CO and moderate pulmonary hypertension-->likely due to untreated sleep apnea. 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  2. Type II diabetes: management per PCP. Continue Lantus. He does not check his blood sugars at home. - Continue Farxiga. 3. HTN: BP high in clinic, but SBP 120's at home. Continue current regimen. Followed by Paramedicine. 4. Deafness: Sign language interpreter present at visit.  5. CKD: Stage 3.  Recent creatinine 2.69. Check BMET today. - Continue Wilder Glade for now.  - Referred to nephrology.  6. PE/DVT: 4/21.  Unclear cause, no prolonged trip, prolonged immobility, or history of malignancy. No FH clotting. Recurrent PE in 10/21 while Eliquis on hold with bleeding from esophagitis/gastritis.  Do no think this was an Eliquis failure.  - Continue apixaban, think he will need long-term with no clear trigger for VTE. No bleeding issues.  7. Hyperlipidemia: Continue atorvastatin, good lipids 1/22.  8. Atrial fibrillation: First noted in setting of DKA in 10/21.  He is now on amiodarone and is regular today.  - Continue amiodarone 100 mg daily. Recent TSH mildly  elevated, T3 & T4 ok. LFTs (2/22) ok. He will need a regular eye exam.  - Not ideal to continue amiodarone long-term, will likely stop in the future if no further AF and arrange for ablation if it recurs.  - Continue Eliquis. No bleeding. 9. ETOH abuse: ETOH may have played a role in both cardiomyopathy and atrial fibrillation.  Not  drinking currently. 10. Severe OSA: recent sleep study (AHI = 125.7/h). Followed by Dr. Radford Pax. Awaiting CPAP titration. 11. Noncompliance:  Paramedicine following, appreciate their assistance.  Follow up in 4-6 weeks as scheduled.  [place], FNP-BC 06/05/2020

## 2020-06-05 ENCOUNTER — Other Ambulatory Visit: Payer: Self-pay

## 2020-06-05 ENCOUNTER — Encounter (HOSPITAL_COMMUNITY): Payer: Self-pay

## 2020-06-05 ENCOUNTER — Ambulatory Visit (HOSPITAL_COMMUNITY)
Admission: RE | Admit: 2020-06-05 | Discharge: 2020-06-05 | Disposition: A | Discharge status: Home / Self Care | Payer: Medicare Other | Source: Ambulatory Visit | Attending: Family Medicine | Admitting: Family Medicine

## 2020-06-05 ENCOUNTER — Encounter: Payer: Self-pay | Admitting: Family Medicine

## 2020-06-05 VITALS — BP 164/98 | HR 68 | Wt 241.6 lb

## 2020-06-05 DIAGNOSIS — E118 Type 2 diabetes mellitus with unspecified complications: Secondary | ICD-10-CM

## 2020-06-05 DIAGNOSIS — I5042 Chronic combined systolic (congestive) and diastolic (congestive) heart failure: Secondary | ICD-10-CM | POA: Insufficient documentation

## 2020-06-05 DIAGNOSIS — N1832 Chronic kidney disease, stage 3b: Secondary | ICD-10-CM

## 2020-06-05 DIAGNOSIS — H9193 Unspecified hearing loss, bilateral: Secondary | ICD-10-CM

## 2020-06-05 DIAGNOSIS — Z91199 Patient's noncompliance with other medical treatment and regimen due to unspecified reason: Secondary | ICD-10-CM

## 2020-06-05 DIAGNOSIS — I480 Paroxysmal atrial fibrillation: Secondary | ICD-10-CM

## 2020-06-05 DIAGNOSIS — F1010 Alcohol abuse, uncomplicated: Secondary | ICD-10-CM

## 2020-06-05 DIAGNOSIS — G4733 Obstructive sleep apnea (adult) (pediatric): Secondary | ICD-10-CM

## 2020-06-05 DIAGNOSIS — E785 Hyperlipidemia, unspecified: Secondary | ICD-10-CM

## 2020-06-05 DIAGNOSIS — Z794 Long term (current) use of insulin: Secondary | ICD-10-CM

## 2020-06-05 DIAGNOSIS — I10 Essential (primary) hypertension: Secondary | ICD-10-CM | POA: Diagnosis not present

## 2020-06-05 DIAGNOSIS — Z86718 Personal history of other venous thrombosis and embolism: Secondary | ICD-10-CM

## 2020-06-05 DIAGNOSIS — Z9119 Patient's noncompliance with other medical treatment and regimen: Secondary | ICD-10-CM

## 2020-06-05 LAB — BASIC METABOLIC PANEL
Anion gap: 12 (ref 5–15)
BUN: 43 mg/dL — ABNORMAL HIGH (ref 6–20)
CO2: 22 mmol/L (ref 22–32)
Calcium: 9.3 mg/dL (ref 8.9–10.3)
Chloride: 102 mmol/L (ref 98–111)
Creatinine, Ser: 2.55 mg/dL — ABNORMAL HIGH (ref 0.61–1.24)
GFR, Estimated: 30 mL/min — ABNORMAL LOW (ref >60)
Glucose, Bld: 74 mg/dL (ref 70–99)
Potassium: 4.4 mmol/L (ref 3.5–5.1)
Sodium: 136 mmol/L (ref 135–145)

## 2020-06-05 NOTE — Patient Instructions (Signed)
Labs done today, your results will be available in MyChart, we will contact you for abnormal readings.  Your follow up appointment is already scheduled  If you have any questions or concerns before your next appointment please send [place] a message through [place] or call our office at [phone].    TO LEAVE A MESSAGE FOR THE NURSE SELECT OPTION 2, PLEASE LEAVE A MESSAGE INCLUDING: . YOUR NAME . DATE OF BIRTH . CALL BACK NUMBER . REASON FOR CALL**this is important as we prioritize the call backs  YOU WILL RECEIVE A CALL BACK THE SAME DAY AS LONG AS YOU CALL BEFORE 4:00 PM

## 2020-06-09 ENCOUNTER — Other Ambulatory Visit: Payer: Self-pay

## 2020-06-09 ENCOUNTER — Ambulatory Visit (INDEPENDENT_AMBULATORY_CARE_PROVIDER_SITE_OTHER): Payer: Medicare Other | Admitting: Family Medicine

## 2020-06-09 ENCOUNTER — Encounter: Payer: Self-pay | Admitting: Family Medicine

## 2020-06-09 ENCOUNTER — Encounter (HOSPITAL_COMMUNITY): Payer: Self-pay

## 2020-06-09 ENCOUNTER — Telehealth: Payer: Self-pay

## 2020-06-09 VITALS — BP 116/65 | HR 67 | Ht 70.0 in | Wt 240.0 lb

## 2020-06-09 DIAGNOSIS — Z9889 Other specified postprocedural states: Secondary | ICD-10-CM

## 2020-06-09 DIAGNOSIS — Z95810 Presence of automatic (implantable) cardiac defibrillator: Secondary | ICD-10-CM

## 2020-06-09 DIAGNOSIS — I5022 Chronic systolic (congestive) heart failure: Secondary | ICD-10-CM | POA: Diagnosis not present

## 2020-06-09 DIAGNOSIS — Z09 Encounter for follow-up examination after completed treatment for conditions other than malignant neoplasm: Secondary | ICD-10-CM | POA: Diagnosis not present

## 2020-06-09 NOTE — Progress Notes (Signed)
Patient [facility] Follow Up  Subjective:  Patient ID: ETAI LISENBY, male    DOB: [birth year]  Age: 50 y.o. MRN: [id number]  CC:  Chief Complaint  Patient presents with  . Follow-up    HPI Troy Rush is a 50 year old male who presents for Hospital Follow Up today.   Patient Active Problem List   Diagnosis Date Noted  . ICD (implantable cardioverter-defibrillator) in place 05/14/2020  . Pulmonary embolism ([place]) 02/05/2020  . Acute kidney injury superimposed on CKD ([place])   . Chronic combined systolic and diastolic heart failure ([place])   . Atrial fibrillation with RVR ([place]) 02/01/2020  . NSVT (nonsustained ventricular tachycardia) ([place]) 02/01/2020  . Acute GI bleeding 02/01/2020  . DKA (diabetic ketoacidosis) ([place]) 02/01/2020  . Bilateral deafness 08/15/2019  . Type 2 diabetes mellitus with complication, with long-term current use of insulin ([place]) 08/15/2019  . Hemoglobin A1C between 7% and 9% indicating borderline diabetic control 08/15/2019  . Insomnia 08/15/2019  . Abdominal pain 11/14/2018  . Ischemic cardiomyopathy 10/14/2016  . NICM (nonischemic cardiomyopathy) ([place]) 09/15/2016  . Chronic systolic CHF (congestive heart failure) ([place]) 05/30/2016  . HTN (hypertension) 05/30/2016  . Deaf 05/30/2016  . Hyperkalemia 05/30/2016  . Diabetes ([place]) 03/21/2016  . Major depressive disorder, recurrent episode ([place]) 03/21/2016   Current Status: Since his last office visit, he has had a Hospital Admission for Right Heart Cath. He also has Today, he is doing well with no complaints. He is accompanied today by [facility]. He was recently diagnosed with Sleep Apnea. He plans to be traveling to [place] in May. He denies fatigue, frequent urination, blurred vision, excessive hunger, excessive thirst, weight gain, weight loss, and poor wound healing. He continues to check his feet regularly. He denies fevers, chills, fatigue, recent  infections, weight loss, and night sweats. He has not had any headaches, visual changes, dizziness, and falls. No chest pain, heart palpitations, cough and shortness of breath reported. Denies GI problems such as nausea, vomiting, diarrhea, and constipation. He has no reports of blood in stools, dysuria and hematuria. No depression or anxiety reported today. He is taking all medications as prescribed. He denies pain today.   Past Medical History:  Diagnosis Date  . AICD (automatic cardioverter/defibrillator) present 10/14/2016  . Bilateral deafness   . Bronchitis   . CHF (congestive heart failure) ([place])   . Depression   . Diabetes mellitus without complication ([place])   . DVT, lower extremity ([place]) 07/12/2019  . Dyslipidemia   . Headache 08/2019  . Hypertension   . Insomnia 08/2019  . NICM (nonischemic cardiomyopathy) ([place]) 09/15/2016  . Pancreatitis   . Vitamin D deficiency 08/2019    Past Surgical History:  Procedure Laterality Date  . BACK SURGERY    . CARDIAC CATHETERIZATION    . ESOPHAGOGASTRODUODENOSCOPY (EGD) WITH PROPOFOL N/A 02/02/2020   Procedure: ESOPHAGOGASTRODUODENOSCOPY (EGD) WITH PROPOFOL;  Surgeon: Arta Silence, MD;  Location: [facility];  Service: Endoscopy;  Laterality: N/A;  . ICD IMPLANT  10/14/2016  . ICD IMPLANT N/A 10/14/2016   Procedure: ICD Implant;  Surgeon: Deboraha Sprang, MD;  Location: [facility];  Service: Cardiovascular;  Laterality: N/A;  . RIGHT HEART CATH N/A 05/21/2020   Procedure: RIGHT HEART CATH;  Surgeon: Larey Dresser, MD;  Location: [facility];  Service: Cardiovascular;  Laterality: N/A;  . WRIST SURGERY      Family History  Problem Relation  Age of Onset  . Hypertension Mother   . Healthy Father   . Stroke Father     Social History   Socioeconomic History  . Marital status: Legally Separated    Spouse name: Not on file  . Number of children: Not on file  . Years of education: Not on file  . Highest education  level: Not on file  Occupational History  . Not on file  Tobacco Use  . Smoking status: Never Smoker  . Smokeless tobacco: Never Used  Vaping Use  . Vaping Use: Never used  Substance and Sexual Activity  . Alcohol use: Yes    Alcohol/week: 1.0 - 2.0 standard drink    Types: 1 - 2 Glasses of wine per week    Comment: rare  . Drug use: No  . Sexual activity: Yes  Other Topics Concern  . Not on file  Social History Narrative  . Not on file   Social Determinants of Health   Financial Resource Strain: Medium Risk  . Difficulty of Paying Living Expenses: Somewhat hard  Food Insecurity: No Food Insecurity  . Worried About Charity fundraiser in the Last Year: Never true  . Ran Out of Food in the Last Year: Never true  Transportation Needs: No Transportation Needs  . Lack of Transportation (Medical): No  . Lack of Transportation (Non-Medical): No  Physical Activity: Not on file  Stress: Not on file  Social Connections: Not on file  Intimate Partner Violence: Not on file    Outpatient Medications Prior to Visit  Medication Sig Dispense Refill  . amiodarone (PACERONE) 200 MG tablet Take 0.5 tablets (100 mg total) by mouth daily. 60 tablet 2  . apixaban (ELIQUIS) 5 MG TABS tablet Take 1 tablet (5 mg total) by mouth 2 (two) times daily. 60 tablet 3  . atorvastatin (LIPITOR) 80 MG tablet Take 1 tablet (80 mg total) by mouth at bedtime. 30 tablet 11  . carvedilol (COREG) 25 MG tablet Take 1 tablet (25 mg total) by mouth 2 (two) times daily with a meal. 60 tablet 6  . dapagliflozin propanediol (FARXIGA) 10 MG TABS tablet Take 1 tablet (10 mg total) by mouth daily before breakfast. 30 tablet 11  . fenofibrate 54 MG tablet Take 1 tablet (54 mg total) by mouth daily. 30 tablet 2  . furosemide (LASIX) 40 MG tablet Take 40 mg by mouth in the morning.    . hydrALAZINE (APRESOLINE) 100 MG tablet Take 1 tablet (100 mg total) by mouth 3 (three) times daily. 90 tablet 3  . icosapent Ethyl  (VASCEPA) 1 g capsule Take 2 capsules (2 g total) by mouth 2 (two) times daily. 120 capsule 5  . insulin glargine (LANTUS) 100 UNIT/ML injection Inject 0.24 mLs (24 Units total) into the skin daily. 10 mL 11  . isosorbide mononitrate (IMDUR) 60 MG 24 hr tablet Take 1 tablet (60 mg total) by mouth daily. 30 tablet 11  . Lancets MISC 1 each by Does not apply route 3 (three) times daily as needed. 300 each 3  . latanoprost (XALATAN) 0.005 % ophthalmic solution Place 1 drop into both eyes at bedtime.    . pantoprazole (PROTONIX) 40 MG tablet Take 1 tablet (40 mg total) by mouth 2 (two) times daily. 180 tablet 3   No facility-administered medications prior to visit.    Allergies  Allergen Reactions  . Lisinopril Cough    ROS Review of Systems  Eyes: Negative.   Respiratory: Positive  for shortness of breath (occasional ).   Cardiovascular: Negative.   Gastrointestinal: Positive for abdominal distention (obese).  Allergic/Immunologic: Negative.   Neurological: Positive for dizziness (occasional ) and headaches (occasional).  Psychiatric/Behavioral: Negative.       Objective:    Physical Exam Vitals reviewed.  Constitutional:      Appearance: Normal appearance.  Cardiovascular:     Rate and Rhythm: Normal rate and regular rhythm.     Pulses: Normal pulses.     Comments: defibrillator in place Pulmonary:     Effort: Pulmonary effort is normal.     Breath sounds: Normal breath sounds.  Abdominal:     General: Bowel sounds are normal.     Palpations: Abdomen is soft.  Neurological:     Mental Status: He is alert.  Psychiatric:        Mood and Affect: Mood normal.        Behavior: Behavior normal.        Thought Content: Thought content normal.        Judgment: Judgment normal.     BP 116/65   Pulse 67   Ht '5\' 10"'$  (1.778 m)   Wt 240 lb (108.9 kg)   SpO2 96%   BMI 34.44 kg/m  Wt Readings from Last 3 Encounters:  06/09/20 240 lb (108.9 kg)  06/05/20 241 lb 9.6 oz  (109.6 kg)  05/30/20 240 lb (108.9 kg)     Health Maintenance Due  Topic Date Due  . Hepatitis C Screening  Never done  . COLONOSCOPY (Pts 45-11yr Insurance coverage will need to be confirmed)  Never done  . OPHTHALMOLOGY EXAM  05/30/2017  . FOOT EXAM  08/08/2018    There are no preventive care reminders to display for this patient.  Lab Results  Component Value Date   TSH 8.426 (H) 04/16/2020   Lab Results  Component Value Date   WBC 4.4 05/15/2020   HGB 11.2 (L) 05/21/2020   HCT 33.0 (L) 05/21/2020   MCV 89.8 05/15/2020   PLT 251 05/15/2020   Lab Results  Component Value Date   NA 136 06/05/2020   K 4.4 06/05/2020   CO2 22 06/05/2020   GLUCOSE 74 06/05/2020   BUN 43 (H) 06/05/2020   CREATININE 2.55 (H) 06/05/2020   BILITOT 0.7 05/15/2020   ALKPHOS 41 05/15/2020   AST 31 05/15/2020   ALT 25 05/15/2020   PROT 7.5 05/15/2020   ALBUMIN 3.8 05/15/2020   CALCIUM 9.3 06/05/2020   ANIONGAP 12 06/05/2020   Lab Results  Component Value Date   CHOL 158 04/30/2020   Lab Results  Component Value Date   HDL 38 (L) 04/30/2020   Lab Results  Component Value Date   LDLCALC 76 04/30/2020   Lab Results  Component Value Date   TRIG 220 (H) 04/30/2020   Lab Results  Component Value Date   CHOLHDL 4.2 04/30/2020   Lab Results  Component Value Date   HGBA1C 7.9 (A) 03/11/2020   HGBA1C 7.8 03/11/2020   HGBA1C 7.8 (A) 03/11/2020   HGBA1C 7.8 (A) 03/11/2020      Assessment & Plan:   1. Hospital discharge follow-up  2. ICD (implantable cardioverter-defibrillator) in place Stable.  3. History of right heart catheterization 05/21/2020.   4. Chronic systolic CHF (congestive heart failure) ([place]  5. Follow up He will follow up in 12/2020.  No orders of the defined types were placed in this encounter.   No orders of the defined types  were placed in this encounter.   Referral Orders  No referral(s) requested today    Kathe Becton, MSN, ANE,  FNP-BC [facility] [address] [place], [place] [address] [phone]- fax   Problem List Items Addressed This Visit   None     No orders of the defined types were placed in this encounter.   Follow-up: No follow-ups on file.    [name], FNP

## 2020-06-09 NOTE — Telephone Encounter (Signed)
Pt called and said can you send him a letter saying he can return to work tomorrow (work excuse. On my chart is fine.

## 2020-06-10 ENCOUNTER — Encounter: Payer: Self-pay | Admitting: Family Medicine

## 2020-06-10 ENCOUNTER — Other Ambulatory Visit (HOSPITAL_COMMUNITY): Payer: Self-pay

## 2020-06-10 NOTE — Progress Notes (Signed)
Paramedicine Encounter    Patient ID: Troy Rush, male    DOB: 1971-01-06, 50 y.o.   MRN: [id number]   Patient Care Team: [name], FNP as PCP - General (Family Medicine) Larey Dresser, MD as PCP - Cardiology (Cardiology) Conrad Victoria, NP as Nurse Practitioner (Cardiology) Larey Dresser, MD as Consulting Physician (Cardiology)  Patient Active Problem List   Diagnosis Date Noted  . ICD (implantable cardioverter-defibrillator) in place 05/14/2020  . Pulmonary embolism ([place]) 02/05/2020  . Acute kidney injury superimposed on CKD ([place])   . Chronic combined systolic and diastolic heart failure ([place])   . Atrial fibrillation with RVR ([place]) 02/01/2020  . NSVT (nonsustained ventricular tachycardia) ([place]) 02/01/2020  . Acute GI bleeding 02/01/2020  . DKA (diabetic ketoacidosis) ([place]) 02/01/2020  . Bilateral deafness 08/15/2019  . Type 2 diabetes mellitus with complication, with long-term current use of insulin ([place]) 08/15/2019  . Hemoglobin A1C between 7% and 9% indicating borderline diabetic control 08/15/2019  . Insomnia 08/15/2019  . Abdominal pain 11/14/2018  . Ischemic cardiomyopathy 10/14/2016  . NICM (nonischemic cardiomyopathy) ([place]) 09/15/2016  . Chronic systolic CHF (congestive heart failure) ([place]) 05/30/2016  . HTN (hypertension) 05/30/2016  . Deaf 05/30/2016  . Hyperkalemia 05/30/2016  . Diabetes ([place]) 03/21/2016  . Major depressive disorder, recurrent episode ([place]) 03/21/2016    Current Outpatient Medications:  .  amiodarone (PACERONE) 200 MG tablet, Take 0.5 tablets (100 mg total) by mouth daily., Disp: 60 tablet, Rfl: 2 .  apixaban (ELIQUIS) 5 MG TABS tablet, Take 1 tablet (5 mg total) by mouth 2 (two) times daily., Disp: 60 tablet, Rfl: 3 .  atorvastatin (LIPITOR) 80 MG tablet, Take 1 tablet (80 mg total) by mouth at bedtime., Disp: 30 tablet, Rfl: 11 .  carvedilol (COREG) 25 MG tablet, Take 1 tablet (25 mg total) by mouth 2 (two) times daily with a  meal., Disp: 60 tablet, Rfl: 6 .  dapagliflozin propanediol (FARXIGA) 10 MG TABS tablet, Take 1 tablet (10 mg total) by mouth daily before breakfast., Disp: 30 tablet, Rfl: 11 .  fenofibrate 54 MG tablet, Take 1 tablet (54 mg total) by mouth daily., Disp: 30 tablet, Rfl: 2 .  furosemide (LASIX) 40 MG tablet, Take 40 mg by mouth in the morning., Disp: , Rfl:  .  hydrALAZINE (APRESOLINE) 100 MG tablet, Take 1 tablet (100 mg total) by mouth 3 (three) times daily., Disp: 90 tablet, Rfl: 3 .  icosapent Ethyl (VASCEPA) 1 g capsule, Take 2 capsules (2 g total) by mouth 2 (two) times daily., Disp: 120 capsule, Rfl: 5 .  insulin glargine (LANTUS) 100 UNIT/ML injection, Inject 0.24 mLs (24 Units total) into the skin daily., Disp: 10 mL, Rfl: 11 .  isosorbide mononitrate (IMDUR) 60 MG 24 hr tablet, Take 1 tablet (60 mg total) by mouth daily., Disp: 30 tablet, Rfl: 11 .  Lancets MISC, 1 each by Does not apply route 3 (three) times daily as needed., Disp: 300 each, Rfl: 3 .  latanoprost (XALATAN) 0.005 % ophthalmic solution, Place 1 drop into both eyes at bedtime., Disp: , Rfl:  .  pantoprazole (PROTONIX) 40 MG tablet, Take 1 tablet (40 mg total) by mouth 2 (two) times daily., Disp: 180 tablet, Rfl: 3 Allergies  Allergen Reactions  . Lisinopril Cough      Social History   Socioeconomic History  . Marital status: Legally Separated    Spouse name: Not on file  . Number of children: Not on file  .  Years of education: Not on file  . Highest education level: Not on file  Occupational History  . Not on file  Tobacco Use  . Smoking status: Never Smoker  . Smokeless tobacco: Never Used  Vaping Use  . Vaping Use: Never used  Substance and Sexual Activity  . Alcohol use: Yes    Alcohol/week: 1.0 - 2.0 standard drink    Types: 1 - 2 Glasses of wine per week    Comment: rare  . Drug use: No  . Sexual activity: Yes  Other Topics Concern  . Not on file  Social History Narrative  . Not on file    Social Determinants of Health   Financial Resource Strain: Medium Risk  . Difficulty of Paying Living Expenses: Somewhat hard  Food Insecurity: No Food Insecurity  . Worried About Charity fundraiser in the Last Year: Never true  . Ran Out of Food in the Last Year: Never true  Transportation Needs: No Transportation Needs  . Lack of Transportation (Medical): No  . Lack of Transportation (Non-Medical): No  Physical Activity: Not on file  Stress: Not on file  Social Connections: Not on file  Intimate Partner Violence: Not on file    Physical Exam      Future Appointments  Date Time Provider [place]  06/22/2020  8:35 AM [facility] DEVICE REMOTES [facility]  07/06/2020 10:00 AM [facility] PA/NP [facility] None  09/21/2020  8:35 AM [facility] DEVICE REMOTES [facility]  12/11/2020 10:00 AM [name], FNP [facility] None  12/21/2020  8:35 AM [facility] DEVICE REMOTES [facility]  03/22/2021  8:35 AM [facility] DEVICE REMOTES [facility]  06/21/2021  8:35 AM [facility] DEVICE REMOTES [facility]  09/20/2021  8:35 AM [facility] DEVICE REMOTES [facility]  12/20/2021  8:35 AM [facility] DEVICE REMOTES [facility]    BP 130/78   Pulse 76   Resp 18   Wt 240 lb (108.9 kg)   SpO2 98%   BMI 34.44 kg/m   Weight yesterday-240  Last visit weight-244  Pt reports he is doing well. He had sleep study done and is waiting on what he needs to do next-- He denies increased sob, no dizziness. No h/a. No edema noted. He looks good today.  No missed doses of his meds.  He had meds this morning.   -by next visit in 2 wks he needs: -carvedilol - farxiga,  -isosorbide--he has enough through pill box #2 through Wednesday so it needs to be placed in Thursday to Sunday.     [name], [facility] Par[facility]ic  06/10/20

## 2020-06-12 ENCOUNTER — Telehealth: Payer: Self-pay | Admitting: *Deleted

## 2020-06-12 DIAGNOSIS — G4733 Obstructive sleep apnea (adult) (pediatric): Secondary | ICD-10-CM

## 2020-06-12 NOTE — Telephone Encounter (Signed)
Informed patient of sleep study results and patient understanding was verbalized. Patient understands his sleep study showed they have severe sleep apnea and recommend CPAP titration. Please set up titration in the sleep lab ASAP as apnea is severe  Pt is aware and agreeable to his results.  titraion to be ordered

## 2020-06-12 NOTE — Telephone Encounter (Signed)
-----   Message from Sueanne Margarita, MD sent at 06/02/2020 11:26 AM EST ----- Please let patient know that they have severe sleep apnea and recommend CPAP titration. Please set up titration in the sleep lab ASAP as apnea is severe

## 2020-06-17 NOTE — Addendum Note (Signed)
Addended by: Freada Bergeron on: 06/17/2020 12:41 PM   Modules accepted: Orders

## 2020-06-22 ENCOUNTER — Ambulatory Visit (INDEPENDENT_AMBULATORY_CARE_PROVIDER_SITE_OTHER): Payer: Medicare Other

## 2020-06-22 DIAGNOSIS — I255 Ischemic cardiomyopathy: Secondary | ICD-10-CM

## 2020-06-23 ENCOUNTER — Other Ambulatory Visit: Payer: Self-pay

## 2020-06-23 ENCOUNTER — Emergency Department (HOSPITAL_COMMUNITY)
Admission: EM | Admit: 2020-06-23 | Discharge: 2020-06-23 | Disposition: A | Discharge status: Home / Self Care | Payer: Medicare Other | Attending: Emergency Medicine | Admitting: Emergency Medicine

## 2020-06-23 ENCOUNTER — Emergency Department (HOSPITAL_COMMUNITY): Payer: Medicare Other

## 2020-06-23 ENCOUNTER — Encounter (HOSPITAL_COMMUNITY): Payer: Self-pay | Admitting: Emergency Medicine

## 2020-06-23 DIAGNOSIS — R059 Cough, unspecified: Secondary | ICD-10-CM | POA: Insufficient documentation

## 2020-06-23 DIAGNOSIS — J811 Chronic pulmonary edema: Secondary | ICD-10-CM | POA: Diagnosis not present

## 2020-06-23 DIAGNOSIS — Z7901 Long term (current) use of anticoagulants: Secondary | ICD-10-CM | POA: Insufficient documentation

## 2020-06-23 DIAGNOSIS — R42 Dizziness and giddiness: Secondary | ICD-10-CM | POA: Diagnosis not present

## 2020-06-23 DIAGNOSIS — R1030 Lower abdominal pain, unspecified: Secondary | ICD-10-CM | POA: Diagnosis not present

## 2020-06-23 DIAGNOSIS — I110 Hypertensive heart disease with heart failure: Secondary | ICD-10-CM | POA: Insufficient documentation

## 2020-06-23 DIAGNOSIS — E119 Type 2 diabetes mellitus without complications: Secondary | ICD-10-CM | POA: Diagnosis not present

## 2020-06-23 DIAGNOSIS — Z95810 Presence of automatic (implantable) cardiac defibrillator: Secondary | ICD-10-CM | POA: Insufficient documentation

## 2020-06-23 DIAGNOSIS — Z79899 Other long term (current) drug therapy: Secondary | ICD-10-CM | POA: Insufficient documentation

## 2020-06-23 DIAGNOSIS — I5042 Chronic combined systolic (congestive) and diastolic (congestive) heart failure: Secondary | ICD-10-CM | POA: Insufficient documentation

## 2020-06-23 DIAGNOSIS — E1165 Type 2 diabetes mellitus with hyperglycemia: Secondary | ICD-10-CM | POA: Diagnosis not present

## 2020-06-23 DIAGNOSIS — N400 Enlarged prostate without lower urinary tract symptoms: Secondary | ICD-10-CM | POA: Diagnosis not present

## 2020-06-23 DIAGNOSIS — I4891 Unspecified atrial fibrillation: Secondary | ICD-10-CM | POA: Insufficient documentation

## 2020-06-23 DIAGNOSIS — R079 Chest pain, unspecified: Secondary | ICD-10-CM | POA: Diagnosis not present

## 2020-06-23 DIAGNOSIS — R0602 Shortness of breath: Secondary | ICD-10-CM | POA: Diagnosis not present

## 2020-06-23 DIAGNOSIS — Z86711 Personal history of pulmonary embolism: Secondary | ICD-10-CM | POA: Insufficient documentation

## 2020-06-23 DIAGNOSIS — Z794 Long term (current) use of insulin: Secondary | ICD-10-CM | POA: Diagnosis not present

## 2020-06-23 DIAGNOSIS — R0789 Other chest pain: Secondary | ICD-10-CM | POA: Diagnosis not present

## 2020-06-23 LAB — LIPASE, BLOOD: Lipase: 45 U/L (ref 11–51)

## 2020-06-23 LAB — CBG MONITORING, ED: Glucose-Capillary: 331 mg/dL — ABNORMAL HIGH (ref 70–99)

## 2020-06-23 LAB — CBC
HCT: 39.3 % (ref 39.0–52.0)
Hemoglobin: 12.5 g/dL — ABNORMAL LOW (ref 13.0–17.0)
MCH: 27.7 pg (ref 26.0–34.0)
MCHC: 31.8 g/dL (ref 30.0–36.0)
MCV: 86.9 fL (ref 80.0–100.0)
Platelets: 191 10*3/uL (ref 150–400)
RBC: 4.52 MIL/uL (ref 4.22–5.81)
RDW: 13 % (ref 11.5–15.5)
WBC: 3.1 10*3/uL — ABNORMAL LOW (ref 4.0–10.5)
nRBC: 0 % (ref 0.0–0.2)

## 2020-06-23 LAB — COMPREHENSIVE METABOLIC PANEL
ALT: 22 U/L (ref 0–44)
AST: 32 U/L (ref 15–41)
Albumin: 3.7 g/dL (ref 3.5–5.0)
Alkaline Phosphatase: 45 U/L (ref 38–126)
Anion gap: 10 (ref 5–15)
BUN: 39 mg/dL — ABNORMAL HIGH (ref 6–20)
CO2: 24 mmol/L (ref 22–32)
Calcium: 9.9 mg/dL (ref 8.9–10.3)
Chloride: 99 mmol/L (ref 98–111)
Creatinine, Ser: 2.8 mg/dL — ABNORMAL HIGH (ref 0.61–1.24)
GFR, Estimated: 26 mL/min — ABNORMAL LOW (ref >60)
Glucose, Bld: 354 mg/dL — ABNORMAL HIGH (ref 70–99)
Potassium: 5 mmol/L (ref 3.5–5.1)
Sodium: 133 mmol/L — ABNORMAL LOW (ref 135–145)
Total Bilirubin: 1.3 mg/dL — ABNORMAL HIGH (ref 0.3–1.2)
Total Protein: 7.2 g/dL (ref 6.5–8.1)

## 2020-06-23 LAB — TROPONIN I (HIGH SENSITIVITY)
Troponin I (High Sensitivity): 26 ng/L — ABNORMAL HIGH (ref <18)
Troponin I (High Sensitivity): 21 ng/L — ABNORMAL HIGH (ref <18)

## 2020-06-23 LAB — BRAIN NATRIURETIC PEPTIDE: B Natriuretic Peptide: 333.7 pg/mL — ABNORMAL HIGH (ref 0.0–100.0)

## 2020-06-23 MED ORDER — DICYCLOMINE HCL 10 MG/ML IM SOLN
20.0000 mg | Freq: Once | INTRAMUSCULAR | Status: AC
  Administered 2020-06-23: 20 mg via INTRAMUSCULAR
  Filled 2020-06-23: qty 2

## 2020-06-23 MED ORDER — ONDANSETRON HCL 4 MG/2ML IJ SOLN
4.0000 mg | Freq: Once | INTRAMUSCULAR | Status: AC
  Administered 2020-06-23: 4 mg via INTRAVENOUS
  Filled 2020-06-23: qty 2

## 2020-06-23 MED ORDER — DICYCLOMINE HCL 20 MG PO TABS: 20.0000 mg | ORAL_TABLET | Freq: Two times a day (BID) | ORAL | 0 refills | Status: DC

## 2020-06-23 MED ORDER — OMEPRAZOLE 20 MG PO CPDR: 20.0000 mg | DELAYED_RELEASE_CAPSULE | Freq: Every day | ORAL | 0 refills | Status: DC

## 2020-06-23 MED ORDER — SODIUM CHLORIDE 0.9 % IV BOLUS
1000.0000 mL | Freq: Once | INTRAVENOUS | Status: AC
  Administered 2020-06-23: 1000 mL via INTRAVENOUS

## 2020-06-23 MED ORDER — PANTOPRAZOLE SODIUM 40 MG IV SOLR
40.0000 mg | Freq: Once | INTRAVENOUS | Status: AC
  Administered 2020-06-23: 40 mg via INTRAVENOUS
  Filled 2020-06-23: qty 40

## 2020-06-23 MED ORDER — MORPHINE SULFATE (PF) 4 MG/ML IV SOLN
4.0000 mg | Freq: Once | INTRAVENOUS | Status: AC
  Administered 2020-06-23: 4 mg via INTRAVENOUS
  Filled 2020-06-23: qty 1

## 2020-06-23 NOTE — ED Provider Notes (Signed)
[facility] EMERGENCY DEPARTMENT Provider Note   CSN: [id number] Arrival date & time: 06/23/20  1348     History Chief Complaint  Patient presents with  . Abdominal Pain  . Chest Pain  . Dizziness  . Shortness of Breath    Troy Rush is a 50 y.o. male.  HPI   HPI was collected using a sign interpreter.  Patient with significant medical history of CAD, with a CID, CHF last EF of 45, A. fib, previous PEs currently on Eliquis hypertension, diabetes, presents with chief complaint of abdominal pain.  He endorses that this all started around 12 PM today, on his way to work he started to feel extremely lightheaded, had change in vision, with severe abdominal pain without nausea or vomiting.  He had slight chest pain without shortness of breath, states that the chest pain, lightheadedness and visual changes have all resolved.  He still has abdominal pain, states it is in his lower abdomen, describes as a cramping-like sensation, not associate with nausea, vomiting, diarrhea, constipation, no urinary symptoms.  He has no significant abdominal history, no history of stomach ulcers, denies any NSAID use or alcohol use, does not smoke, states he has had pancreatitis in the past, no history of diverticulitis, or gallbladder abnormalities, no history of appendicitis.  Patient denies  alleviating factors.  Patient denies headaches, fevers, chills, sore throat, chest pain, shortness of breath, worsening pedal edema.  Past Medical History:  Diagnosis Date  . AICD (automatic cardioverter/defibrillator) present 10/14/2016  . Bilateral deafness   . Bronchitis   . CHF (congestive heart failure) ([place])   . Depression   . Diabetes mellitus without complication ([place])   . DVT, lower extremity ([place]) 07/12/2019  . Dyslipidemia   . Headache 08/2019  . Hypertension   . ICD (implantable cardioverter-defibrillator) in place   . Insomnia 08/2019  . NICM (nonischemic cardiomyopathy) ([place])  09/15/2016  . Pancreatitis   . Sleep apnea   . Vitamin D deficiency 08/2019    Patient Active Problem List   Diagnosis Date Noted  . ICD (implantable cardioverter-defibrillator) in place 05/14/2020  . Pulmonary embolism ([place]) 02/05/2020  . Acute kidney injury superimposed on CKD ([place])   . Chronic combined systolic and diastolic heart failure ([place])   . Atrial fibrillation with RVR ([place]) 02/01/2020  . NSVT (nonsustained ventricular tachycardia) ([place]) 02/01/2020  . Acute GI bleeding 02/01/2020  . DKA (diabetic ketoacidosis) ([place]) 02/01/2020  . Bilateral deafness 08/15/2019  . Type 2 diabetes mellitus with complication, with long-term current use of insulin ([place]) 08/15/2019  . Hemoglobin A1C between 7% and 9% indicating borderline diabetic control 08/15/2019  . Insomnia 08/15/2019  . Abdominal pain 11/14/2018  . Ischemic cardiomyopathy 10/14/2016  . NICM (nonischemic cardiomyopathy) ([place]) 09/15/2016  . Chronic systolic CHF (congestive heart failure) ([place]) 05/30/2016  . HTN (hypertension) 05/30/2016  . Deaf 05/30/2016  . Hyperkalemia 05/30/2016  . Diabetes ([place]) 03/21/2016  . Major depressive disorder, recurrent episode ([place]) 03/21/2016    Past Surgical History:  Procedure Laterality Date  . BACK SURGERY    . CARDIAC CATHETERIZATION    . ESOPHAGOGASTRODUODENOSCOPY (EGD) WITH PROPOFOL N/A 02/02/2020   Procedure: ESOPHAGOGASTRODUODENOSCOPY (EGD) WITH PROPOFOL;  Surgeon: Arta Silence, MD;  Location: [facility];  Service: Endoscopy;  Laterality: N/A;  . ICD IMPLANT  10/14/2016  . ICD IMPLANT N/A 10/14/2016   Procedure: ICD Implant;  Surgeon: Deboraha Sprang, MD;  Location: [facility];  Service: Cardiovascular;  Laterality: N/A;  .  RIGHT HEART CATH N/A 05/21/2020   Procedure: RIGHT HEART CATH;  Surgeon: Larey Dresser, MD;  Location: [facility];  Service: Cardiovascular;  Laterality: N/A;  . WRIST SURGERY         Family History  Problem Relation Age of Onset   . Hypertension Mother   . Healthy Father   . Stroke Father     Social History   Tobacco Use  . Smoking status: Never Smoker  . Smokeless tobacco: Never Used  Vaping Use  . Vaping Use: Never used  Substance Use Topics  . Alcohol use: Yes    Alcohol/week: 1.0 - 2.0 standard drink    Types: 1 - 2 Glasses of wine per week    Comment: rare  . Drug use: No    Home Medications Prior to Admission medications   Medication Sig Start Date End Date Taking? Authorizing Provider  dicyclomine (BENTYL) 20 MG tablet Take 1 tablet (20 mg total) by mouth 2 (two) times daily. 06/23/20  Yes Marcello Fennel, PA-C  omeprazole (PRILOSEC) 20 MG capsule Take 1 capsule (20 mg total) by mouth daily. 06/23/20 07/23/20 Yes Marcello Fennel, PA-C  amiodarone (PACERONE) 200 MG tablet Take 0.5 tablets (100 mg total) by mouth daily. 05/19/20   Larey Dresser, MD  apixaban (ELIQUIS) 5 MG TABS tablet Take 1 tablet (5 mg total) by mouth 2 (two) times daily. 03/18/20   Larey Dresser, MD  atorvastatin (LIPITOR) 80 MG tablet Take 1 tablet (80 mg total) by mouth at bedtime. 03/18/20   Larey Dresser, MD  carvedilol (COREG) 25 MG tablet Take 1 tablet (25 mg total) by mouth 2 (two) times daily with a meal. 04/15/20   Larey Dresser, MD  dapagliflozin propanediol (FARXIGA) 10 MG TABS tablet Take 1 tablet (10 mg total) by mouth daily before breakfast. 04/16/20   [place], Maricela Bo, FNP  fenofibrate 54 MG tablet Take 1 tablet (54 mg total) by mouth daily. 04/29/20 07/28/20  Rafael Bihari, FNP  furosemide (LASIX) 40 MG tablet Take 40 mg by mouth in the morning.    [provider]  hydrALAZINE (APRESOLINE) 100 MG tablet Take 1 tablet (100 mg total) by mouth 3 (three) times daily. 04/16/20   [place], Maricela Bo, FNP  icosapent Ethyl (VASCEPA) 1 g capsule Take 2 capsules (2 g total) by mouth 2 (two) times daily. 09/11/19   Larey Dresser, MD  insulin glargine (LANTUS) 100 UNIT/ML injection Inject 0.24 mLs (24 Units  total) into the skin daily. 02/04/20   Raiford Noble Latif, DO  isosorbide mononitrate (IMDUR) 60 MG 24 hr tablet Take 1 tablet (60 mg total) by mouth daily. 04/09/20   Larey Dresser, MD  Lancets MISC 1 each by Does not apply route 3 (three) times daily as needed. 03/11/20   [name], FNP  latanoprost (XALATAN) 0.005 % ophthalmic solution Place 1 drop into both eyes at bedtime. 01/20/20   [provider]  pantoprazole (PROTONIX) 40 MG tablet Take 1 tablet (40 mg total) by mouth 2 (two) times daily. 03/25/20   [name], FNP    Allergies    Lisinopril  Review of Systems   Review of Systems  Constitutional: Negative for chills and fever.  HENT: Positive for congestion. Negative for tinnitus.   Respiratory: Positive for cough. Negative for shortness of breath.   Cardiovascular: Negative for chest pain.  Gastrointestinal: Positive for abdominal pain. Negative for diarrhea, nausea and vomiting.  Genitourinary: Negative for enuresis and flank pain.  Musculoskeletal: Negative for back pain.  Skin: Negative for rash.  Neurological: Positive for dizziness and light-headedness.  Hematological: Does not bruise/bleed easily.    Physical Exam Updated Vital Signs BP (!) 160/102   Pulse 78   Temp 97.7 F (36.5 C) (Oral)   Resp 13   Ht '5\' 10"'  (1.778 m)   Wt 108.9 kg   SpO2 99%   BMI 34.44 kg/m   Physical Exam Vitals and nursing note reviewed.  Constitutional:      General: He is not in acute distress.    Appearance: He is not ill-appearing.  HENT:     Head: Normocephalic and atraumatic.     Nose: No congestion.     Mouth/Throat:     Mouth: Mucous membranes are moist.     Pharynx: Oropharynx is clear.  Eyes:     Conjunctiva/sclera: Conjunctivae normal.  Cardiovascular:     Rate and Rhythm: Normal rate and regular rhythm.     Pulses: Normal pulses.     Heart sounds: No murmur heard. No friction rub. No gallop.   Pulmonary:     Effort: No respiratory  distress.     Breath sounds: No wheezing, rhonchi or rales.  Abdominal:     General: There is no distension.     Palpations: Abdomen is soft.     Tenderness: There is abdominal tenderness. There is no right CVA tenderness or left CVA tenderness.     Comments: Patient is visualized, nondistended, normoactive bowel sounds, dull to percussion.  He was tender to palpation in his left and right lower quadrants, negative Murphy sign, McBurney point, no rebound tenderness or peritoneal sign no CVA tenderness.  Musculoskeletal:     Right lower leg: No edema.     Left lower leg: No edema.  Skin:    General: Skin is warm and dry.  Neurological:     Mental Status: He is alert.  Psychiatric:        Mood and Affect: Mood normal.     ED Results / Procedures / Treatments   Labs (all labs ordered are listed, but only abnormal results are displayed) Labs Reviewed  CBC - Abnormal; Notable for the following components:      Result Value   WBC 3.1 (*)    Hemoglobin 12.5 (*)    All other components within normal limits  COMPREHENSIVE METABOLIC PANEL - Abnormal; Notable for the following components:   Sodium 133 (*)    Glucose, Bld 354 (*)    BUN 39 (*)    Creatinine, Ser 2.80 (*)    Total Bilirubin 1.3 (*)    GFR, Estimated 26 (*)    All other components within normal limits  CBG MONITORING, ED - Abnormal; Notable for the following components:   Glucose-Capillary 331 (*)    All other components within normal limits  TROPONIN I (HIGH SENSITIVITY) - Abnormal; Notable for the following components:   Troponin I (High Sensitivity) 26 (*)    All other components within normal limits  TROPONIN I (HIGH SENSITIVITY) - Abnormal; Notable for the following components:   Troponin I (High Sensitivity) 21 (*)    All other components within normal limits  LIPASE, BLOOD  URINALYSIS, ROUTINE W REFLEX MICROSCOPIC  BRAIN NATRIURETIC PEPTIDE    EKG EKG Interpretation  Date/Time:  Tuesday June 23 2020  13:54:31 EDT Ventricular Rate:  75 PR Interval:  188 QRS Duration: 92 QT Interval:  460  QTC Calculation: 513 R Axis:   48 Text Interpretation: Normal sinus rhythm Right atrial enlargement Anterior infarct , age undetermined Prolonged QT Abnormal ECG TWI new since previous Confirmed by Wandra Arthurs [phone]) on 06/23/2020 5:16:04 PM   Radiology CT ABDOMEN PELVIS WO CONTRAST  Result Date: 06/23/2020 CLINICAL DATA:  Abdominal pain. EXAM: CT ABDOMEN AND PELVIS WITHOUT CONTRAST TECHNIQUE: Multidetector CT imaging of the abdomen and pelvis was performed following the standard protocol without IV contrast. COMPARISON:  February 05, 2020 FINDINGS: Lower chest: There is mild cardiomegaly. Hepatobiliary: No focal liver abnormality is seen. No gallstones, gallbladder wall thickening, or biliary dilatation. Pancreas: Unremarkable. No pancreatic ductal dilatation or surrounding inflammatory changes. Spleen: Normal in size without focal abnormality. Adrenals/Urinary Tract: Adrenal glands are unremarkable. Kidneys are normal, without renal calculi, focal lesion, or hydronephrosis. Urinary bladder is unremarkable. Stomach/Bowel: Stomach is within normal limits. Appendix appears normal. A moderate to marked amount of stool is seen throughout the large bowel. No evidence of bowel wall thickening, distention, or inflammatory changes. Vascular/Lymphatic: No significant vascular findings are present. No enlarged abdominal or pelvic lymph nodes. Reproductive: The prostate gland is mildly enlarged. Other: No abdominal wall hernia or abnormality. No abdominopelvic ascites. Musculoskeletal: No acute or significant osseous findings. IMPRESSION: 1. No evidence of acute or active process within the abdomen or pelvis. Electronically Signed   By: Virgina Norfolk M.D.   On: 06/23/2020 19:48   DG Chest 2 View  Result Date: 06/23/2020 CLINICAL DATA:  Chest pain.  Shortness of breath. EXAM: CHEST - 2 VIEW COMPARISON:  08/18/2019  FINDINGS: Low volume film. Cardiopericardial silhouette is at upper limits of normal for size. There is pulmonary vascular congestion without overt pulmonary edema. Nodular density in the left parahilar lung may be a vessel on end but appears larger than expected for this position. No pulmonary nodule seen at this location on CT angio chest 02/05/2020. Single lead pacer/AICD noted. IMPRESSION: 1. Low volume film with pulmonary vascular congestion. 2. Left parahilar nodular density, potentially a vessel on end. CT chest without contrast recommended to further evaluate. Electronically Signed   By: Misty Stanley M.D.   On: 06/23/2020 14:38    Procedures Procedures   Medications Ordered in ED Medications  sodium chloride 0.9 % bolus 1,000 mL (0 mLs Intravenous Stopped 06/23/20 1725)  ondansetron (ZOFRAN) injection 4 mg (4 mg Intravenous Given 06/23/20 1839)  dicyclomine (BENTYL) injection 20 mg (20 mg Intramuscular Given 06/23/20 1653)  pantoprazole (PROTONIX) injection 40 mg (40 mg Intravenous Given 06/23/20 1839)  morphine 4 MG/ML injection 4 mg (4 mg Intravenous Given 06/23/20 1851)    ED Course  I have reviewed the triage vital signs and the nursing notes.  Pertinent labs & imaging results that were available during my care of the patient were reviewed by me and considered in my medical decision making (see chart for details).    MDM Rules/Calculators/A&P                         Initial impression-patient with abdominal pain.  He is alert, does not appear acute distress, vital signs reassuring.  Will obtain basic lab work, provide patient with fluids, antiemetics, Bentyl and reassess.  Work-up-CBC shows slight leukocytopenia of 3.1 appears to be at baseline, normocytic anemia appears to be baseline for patient.  CMP shows slight hyponatremia of 133, hyperglycemia of 354, BUN elevated 39, creatinine of 2.8 appears to be baseline for patient.  Elevated T bili  1.3.  Lipase 45.  First troponin is 26,  second opponent is 21 chest x-ray shows low volume film with pulmonary vascular congestion, left perihilar nodule density potentially vessel on end.  CT abdomen pelvis without contrast shows no evidence of acute or active process within the abdomen or pelvis.  Reassessment patient was reassessed after providing him with Benty, Zofran and Protonix and chills have lower abdominal pain.  States nausea is better vital signs remained stable.  Concern for possible diverticulitis, appendicitis will obtain CT abdomen without contrast for further evaluation.  Patient was reassessed after imaging as well as provide him with morphine for pain, states pain has improved significantly, has no other comments at this time.  Vital signs have remained stable, lab work imaging reassuring.  Patient comfortable discharge at this time.  Rule out- I have low suspicion for ACS as history is atypical, patient has no cardiac history, EKG was sinus rhythm without signs of ischemia, patient had a negative delta troponin.  Patient's troponin is noted to be elevated but this appears to be a baseline for patient, doubt ACS as there is no EKG changes. Low suspicion for PE as patient denies pleuritic chest pain, shortness of breath, patient denies leg pain, no pedal edema noted on exam, vital signs reassuring.  Low suspicion for AAA or aortic dissection as history is atypical, patient has low risk factors.  Low suspicion for gallbladder abnormality is no elevation in liver enzymes or alk phos.  Patient has nonspecific T bili elevation no right upper quadrant pain, no signs infection noted on CT abdomen pelvis.  Low suspicion for perforated abdominal ulcer as abdomen is nondistended, no peritoneal sign, CT imaging reassuring.   Low Suspicion for pancreatitis as there is no epigastric pain, lipase is within normal limits.  low suspicion for systemic infection as patient is nontoxic-appearing, vital signs reassuring, no obvious source infection  noted on exam.  Plan-I suspect patient suffering from gastritis will recommend PPIs, Bentyl, follow-up with PCP for further evaluation.  Vital signs have remained stable, no indication for hospital admission. Patient given at home care as well strict return precautions.  Patient verbalized that they understood agreed to said plan.   Final Clinical Impression(s) / ED Diagnoses Final diagnoses:  Lower abdominal pain    Rx / DC Orders ED Discharge Orders         Ordered    dicyclomine (BENTYL) 20 MG tablet  2 times daily        06/23/20 2016    omeprazole (PRILOSEC) 20 MG capsule  Daily        06/23/20 2016           Aron Baba 06/23/20 2102    Lacretia Leigh, MD 06/24/20 [phone]

## 2020-06-23 NOTE — ED Triage Notes (Signed)
Arrived via EMS Used ASL via iPad. onset today developed chest pain, shortness of breath, abdominal pain, and dizziness. Pain currently 6/10 achy. EMS administered Aspirin '324mg'$  prior to arrival.

## 2020-06-23 NOTE — Discharge Instructions (Addendum)
I suspect you are suffering from gastritis.  I have started you on 2 medications one is a acid pill please take as prescribed.  Also given you a medication called Bentyl this will help with muscle spasms please take as needed for stomach cramps.  Please continue to take your medications as prescribed especially your insulin has you have not taking it in the last 2 days.  If your symptoms not resolve in the next couple days advised to follow-up with your primary care provider for further evaluation.  Come back to the emergency department if you develop chest pain, shortness of breath, severe abdominal pain, uncontrolled nausea, vomiting, diarrhea.

## 2020-06-23 NOTE — ED Notes (Signed)
DC instructions reviewed with the pt.  PT verbalized understanding.  PT Dc.

## 2020-06-23 NOTE — ED Notes (Signed)
Patient transported to X-ray 

## 2020-06-23 NOTE — ED Notes (Signed)
Patient transported to CT 

## 2020-06-24 ENCOUNTER — Other Ambulatory Visit (HOSPITAL_COMMUNITY): Payer: Self-pay

## 2020-06-24 ENCOUNTER — Telehealth (HOSPITAL_COMMUNITY): Payer: Self-pay | Admitting: Cardiology

## 2020-06-24 LAB — CUP PACEART REMOTE DEVICE CHECK
Battery Remaining Longevity: 150 mo
Battery Remaining Percentage: 100 %
Brady Statistic RV Percent Paced: 0 %
Date Time Interrogation Session: 20220314004200
HighPow Impedance: 63 Ohm
Implantable Lead Implant Date: 20180706
Implantable Lead Location: 753860
Implantable Lead Model: 293
Implantable Lead Serial Number: 432676
Implantable Pulse Generator Implant Date: 20180706
Lead Channel Impedance Value: 441 Ohm
Lead Channel Setting Pacing Amplitude: 2.5 V
Lead Channel Setting Pacing Pulse Width: 0.4 ms
Lead Channel Setting Sensing Sensitivity: 0.5 mV
Pulse Gen Serial Number: 236570

## 2020-06-24 NOTE — Telephone Encounter (Addendum)
Patient is scheduled for CPAP Titration on 08-04-20. Patient understands his titration study will be done at [facility] sleep lab. Patient understands he will receive a letter in a week or so detailing appointment, date, time, and location. Patient understands to call if he does not receive the letter  in a timely manner. Left detailed message on voicemail and informed patient to call back to confirm or reschedule.

## 2020-06-24 NOTE — Telephone Encounter (Signed)
Pt aware via [name]

## 2020-06-24 NOTE — Telephone Encounter (Signed)
Hold all meds tonight except eliquis.   Hold lasix x 2 days then restart on 3/19    Please call.   Jilliann Subramanian NP-C  11:05 AM

## 2020-06-24 NOTE — Telephone Encounter (Signed)
Katie with para medicine called to report b/p low today Report pt always has dizziness  B/p sitting 90/60 standing SBP 60   Pt seen in ER f3/15 for abdominal pain Pt has already taken AM meds advised to HOLD PM meds until response from HF clinc

## 2020-06-24 NOTE — Progress Notes (Addendum)
Paramedicine Encounter    Patient ID: Troy Rush, male    DOB: Aug 07, 1970, 50 y.o.   MRN: [id number]   Patient Care Team: [name], FNP as PCP - General (Family Medicine) Larey Dresser, MD as PCP - Cardiology (Cardiology) Conrad [place], NP as Nurse Practitioner (Cardiology) Larey Dresser, MD as Consulting Physician (Cardiology)  Patient Active Problem List   Diagnosis Date Noted  . ICD (implantable cardioverter-defibrillator) in place 05/14/2020  . Pulmonary embolism ([place]) 02/05/2020  . Acute kidney injury superimposed on CKD ([place])   . Chronic combined systolic and diastolic heart failure ([place])   . Atrial fibrillation with RVR ([place]) 02/01/2020  . NSVT (nonsustained ventricular tachycardia) ([place]) 02/01/2020  . Acute GI bleeding 02/01/2020  . DKA (diabetic ketoacidosis) ([place]) 02/01/2020  . Bilateral deafness 08/15/2019  . Type 2 diabetes mellitus with complication, with long-term current use of insulin ([place]) 08/15/2019  . Hemoglobin A1C between 7% and 9% indicating borderline diabetic control 08/15/2019  . Insomnia 08/15/2019  . Abdominal pain 11/14/2018  . Ischemic cardiomyopathy 10/14/2016  . NICM (nonischemic cardiomyopathy) ([place]) 09/15/2016  . Chronic systolic CHF (congestive heart failure) ([place]) 05/30/2016  . HTN (hypertension) 05/30/2016  . Deaf 05/30/2016  . Hyperkalemia 05/30/2016  . Diabetes ([place]) 03/21/2016  . Major depressive disorder, recurrent episode ([place]) 03/21/2016    Current Outpatient Medications:  .  amiodarone (PACERONE) 200 MG tablet, Take 0.5 tablets (100 mg total) by mouth daily., Disp: 60 tablet, Rfl: 2 .  apixaban (ELIQUIS) 5 MG TABS tablet, Take 1 tablet (5 mg total) by mouth 2 (two) times daily., Disp: 60 tablet, Rfl: 3 .  atorvastatin (LIPITOR) 80 MG tablet, Take 1 tablet (80 mg total) by mouth at bedtime., Disp: 30 tablet, Rfl: 11 .  carvedilol (COREG) 25 MG tablet, Take 1 tablet (25 mg total) by mouth 2 (two) times daily with a  meal., Disp: 60 tablet, Rfl: 6 .  dapagliflozin propanediol (FARXIGA) 10 MG TABS tablet, Take 1 tablet (10 mg total) by mouth daily before breakfast., Disp: 30 tablet, Rfl: 11 .  dicyclomine (BENTYL) 20 MG tablet, Take 1 tablet (20 mg total) by mouth 2 (two) times daily., Disp: 20 tablet, Rfl: 0 .  fenofibrate 54 MG tablet, Take 1 tablet (54 mg total) by mouth daily., Disp: 30 tablet, Rfl: 2 .  furosemide (LASIX) 40 MG tablet, Take 40 mg by mouth in the morning., Disp: , Rfl:  .  hydrALAZINE (APRESOLINE) 100 MG tablet, Take 1 tablet (100 mg total) by mouth 3 (three) times daily., Disp: 90 tablet, Rfl: 3 .  icosapent Ethyl (VASCEPA) 1 g capsule, Take 2 capsules (2 g total) by mouth 2 (two) times daily., Disp: 120 capsule, Rfl: 5 .  insulin glargine (LANTUS) 100 UNIT/ML injection, Inject 0.24 mLs (24 Units total) into the skin daily., Disp: 10 mL, Rfl: 11 .  isosorbide mononitrate (IMDUR) 60 MG 24 hr tablet, Take 1 tablet (60 mg total) by mouth daily., Disp: 30 tablet, Rfl: 11 .  Lancets MISC, 1 each by Does not apply route 3 (three) times daily as needed., Disp: 300 each, Rfl: 3 .  latanoprost (XALATAN) 0.005 % ophthalmic solution, Place 1 drop into both eyes at bedtime., Disp: , Rfl:  .  pantoprazole (PROTONIX) 40 MG tablet, Take 1 tablet (40 mg total) by mouth 2 (two) times daily., Disp: 180 tablet, Rfl: 3 .  omeprazole (PRILOSEC) 20 MG capsule, Take 1 capsule (20 mg total) by mouth daily. (Patient not taking:  Reported on 06/24/2020), Disp: 30 capsule, Rfl: 0 Allergies  Allergen Reactions  . Lisinopril Cough      Social History   Socioeconomic History  . Marital status: Legally Separated    Spouse name: Not on file  . Number of children: Not on file  . Years of education: Not on file  . Highest education level: Not on file  Occupational History  . Not on file  Tobacco Use  . Smoking status: Never Smoker  . Smokeless tobacco: Never Used  Vaping Use  . Vaping Use: Never used   Substance and Sexual Activity  . Alcohol use: Yes    Alcohol/week: 1.0 - 2.0 standard drink    Types: 1 - 2 Glasses of wine per week    Comment: rare  . Drug use: No  . Sexual activity: Yes  Other Topics Concern  . Not on file  Social History Narrative  . Not on file   Social Determinants of Health   Financial Resource Strain: Medium Risk  . Difficulty of Paying Living Expenses: Somewhat hard  Food Insecurity: No Food Insecurity  . Worried About Charity fundraiser in the Last Year: Never true  . Ran Out of Food in the Last Year: Never true  Transportation Needs: No Transportation Needs  . Lack of Transportation (Medical): No  . Lack of Transportation (Non-Medical): No  Physical Activity: Not on file  Stress: Not on file  Social Connections: Not on file  Intimate Partner Violence: Not on file    Physical Exam      Future Appointments  Date Time Provider [place]  07/06/2020 10:00 AM [facility] PA/NP [facility] None  08/04/2020  8:00 PM Sueanne Margarita, MD [facility]  09/21/2020  8:35 AM [facility] DEVICE REMOTES [facility]  12/11/2020 10:00 AM [name], FNP [facility] None  12/21/2020  8:35 AM [facility] DEVICE REMOTES [facility]  03/22/2021  8:35 AM [facility] DEVICE REMOTES [facility]  06/21/2021  8:35 AM [facility] DEVICE REMOTES [facility]  09/20/2021  8:35 AM [facility] DEVICE REMOTES [facility]  12/20/2021  8:35 AM [facility] DEVICE REMOTES [facility]    BP 90/60   Pulse 70   Resp 18   Wt 237 lb (107.5 kg)   SpO2 95%   BMI 34.01 kg/m  B/p standing-60/systolic  Weight yesterday-? Last visit weight-240  Pt had to go to ER yesterday, he was at the bus stop and he began to feel dizzy, blurred vision and abd pain and nearly passed out so bystander called 911.  He was treated for abd pain, was d/c with bentyl. Pharmacy will send that out today.  He has 2nd sleep  study in April, need to see if jenna could call to see if anything is avail sooner. I would send message but not sure who to contact there.  He is still having some dizziness today-minimal. He is not going to work today.  He plans to go out of country for 2 months and will need cpap before he goes.  Will work something out for his meds.to get him through 2+ months. --maybe we could do bubble packs for those couple months.  He missed a few doses of his meds b/c he didn't feel well.  meds verified and pill box refilled.  B/p is sigificantly low with orthostatic changes-I double checked it against the home cuff to see if was close and it was nearly the same.  Contacted clinic and chantel will let provider  know for further instructions.  UFN he is holding the rest of his meds until we hear back.   --per amy is to hold all meds tonight except eliquis, hold lasix X 2 days and resume on 3/19 normal doses.   -will need amio, eliquis, fenofibrate, vascepa, pantoprazole   [name], [place] [facility]  06/24/20

## 2020-06-29 ENCOUNTER — Telehealth (HOSPITAL_COMMUNITY): Payer: Self-pay | Admitting: *Deleted

## 2020-06-29 NOTE — Telephone Encounter (Signed)
Pt will be leaving the country for two months and asked to get his cpap titration moved up. I contacted Gae Bon with Dr.Turner's office and she gave me the number to the sleep lab. I called [phone] No answer/left vm requesting they return my call or contact the patient directly to reschedule if a sooner appt was available.

## 2020-07-01 NOTE — Progress Notes (Signed)
Remote ICD transmission.   

## 2020-07-06 ENCOUNTER — Other Ambulatory Visit (HOSPITAL_COMMUNITY): Payer: Self-pay | Admitting: Cardiology

## 2020-07-06 ENCOUNTER — Encounter (HOSPITAL_COMMUNITY): Payer: Medicare Other

## 2020-07-06 ENCOUNTER — Other Ambulatory Visit (HOSPITAL_COMMUNITY): Payer: Self-pay | Admitting: Family Medicine

## 2020-07-06 DIAGNOSIS — E781 Pure hyperglyceridemia: Secondary | ICD-10-CM

## 2020-07-07 ENCOUNTER — Emergency Department (HOSPITAL_COMMUNITY): Payer: Medicare Other

## 2020-07-07 ENCOUNTER — Encounter (HOSPITAL_COMMUNITY): Payer: Self-pay

## 2020-07-07 ENCOUNTER — Inpatient Hospital Stay (HOSPITAL_COMMUNITY)
Admission: EM | Admit: 2020-07-07 | Discharge: 2020-07-09 | DRG: 291 | Disposition: A | Discharge status: Home Health | Payer: Medicare Other | Attending: Internal Medicine | Admitting: Internal Medicine

## 2020-07-07 ENCOUNTER — Other Ambulatory Visit: Payer: Self-pay

## 2020-07-07 DIAGNOSIS — N1831 Chronic kidney disease, stage 3a: Secondary | ICD-10-CM | POA: Diagnosis present

## 2020-07-07 DIAGNOSIS — I959 Hypotension, unspecified: Secondary | ICD-10-CM | POA: Diagnosis not present

## 2020-07-07 DIAGNOSIS — R1013 Epigastric pain: Secondary | ICD-10-CM | POA: Diagnosis not present

## 2020-07-07 DIAGNOSIS — I10 Essential (primary) hypertension: Secondary | ICD-10-CM | POA: Diagnosis not present

## 2020-07-07 DIAGNOSIS — Z888 Allergy status to other drugs, medicaments and biological substances status: Secondary | ICD-10-CM

## 2020-07-07 DIAGNOSIS — I2510 Atherosclerotic heart disease of native coronary artery without angina pectoris: Secondary | ICD-10-CM | POA: Diagnosis present

## 2020-07-07 DIAGNOSIS — K429 Umbilical hernia without obstruction or gangrene: Secondary | ICD-10-CM | POA: Diagnosis not present

## 2020-07-07 DIAGNOSIS — I428 Other cardiomyopathies: Secondary | ICD-10-CM | POA: Diagnosis present

## 2020-07-07 DIAGNOSIS — H9193 Unspecified hearing loss, bilateral: Secondary | ICD-10-CM | POA: Diagnosis present

## 2020-07-07 DIAGNOSIS — Z794 Long term (current) use of insulin: Secondary | ICD-10-CM | POA: Diagnosis not present

## 2020-07-07 DIAGNOSIS — E785 Hyperlipidemia, unspecified: Secondary | ICD-10-CM | POA: Diagnosis present

## 2020-07-07 DIAGNOSIS — N179 Acute kidney failure, unspecified: Secondary | ICD-10-CM | POA: Diagnosis present

## 2020-07-07 DIAGNOSIS — R1084 Generalized abdominal pain: Secondary | ICD-10-CM | POA: Diagnosis not present

## 2020-07-07 DIAGNOSIS — E118 Type 2 diabetes mellitus with unspecified complications: Secondary | ICD-10-CM | POA: Diagnosis not present

## 2020-07-07 DIAGNOSIS — N1832 Chronic kidney disease, stage 3b: Secondary | ICD-10-CM | POA: Diagnosis not present

## 2020-07-07 DIAGNOSIS — Z20822 Contact with and (suspected) exposure to covid-19: Secondary | ICD-10-CM | POA: Diagnosis present

## 2020-07-07 DIAGNOSIS — I509 Heart failure, unspecified: Secondary | ICD-10-CM | POA: Diagnosis not present

## 2020-07-07 DIAGNOSIS — R109 Unspecified abdominal pain: Secondary | ICD-10-CM | POA: Diagnosis not present

## 2020-07-07 DIAGNOSIS — E1165 Type 2 diabetes mellitus with hyperglycemia: Secondary | ICD-10-CM | POA: Diagnosis present

## 2020-07-07 DIAGNOSIS — N183 Chronic kidney disease, stage 3 unspecified: Secondary | ICD-10-CM

## 2020-07-07 DIAGNOSIS — Z95810 Presence of automatic (implantable) cardiac defibrillator: Secondary | ICD-10-CM

## 2020-07-07 DIAGNOSIS — I700 Atherosclerosis of aorta: Secondary | ICD-10-CM | POA: Diagnosis not present

## 2020-07-07 DIAGNOSIS — Z79899 Other long term (current) drug therapy: Secondary | ICD-10-CM | POA: Diagnosis not present

## 2020-07-07 DIAGNOSIS — Z7901 Long term (current) use of anticoagulants: Secondary | ICD-10-CM

## 2020-07-07 DIAGNOSIS — R079 Chest pain, unspecified: Secondary | ICD-10-CM | POA: Diagnosis not present

## 2020-07-07 DIAGNOSIS — I130 Hypertensive heart and chronic kidney disease with heart failure and stage 1 through stage 4 chronic kidney disease, or unspecified chronic kidney disease: Principal | ICD-10-CM | POA: Diagnosis present

## 2020-07-07 DIAGNOSIS — Z6833 Body mass index (BMI) 33.0-33.9, adult: Secondary | ICD-10-CM | POA: Diagnosis not present

## 2020-07-07 DIAGNOSIS — I5043 Acute on chronic combined systolic (congestive) and diastolic (congestive) heart failure: Secondary | ICD-10-CM | POA: Diagnosis present

## 2020-07-07 DIAGNOSIS — I480 Paroxysmal atrial fibrillation: Secondary | ICD-10-CM | POA: Diagnosis present

## 2020-07-07 DIAGNOSIS — E1122 Type 2 diabetes mellitus with diabetic chronic kidney disease: Secondary | ICD-10-CM | POA: Diagnosis present

## 2020-07-07 DIAGNOSIS — E669 Obesity, unspecified: Secondary | ICD-10-CM | POA: Diagnosis present

## 2020-07-07 DIAGNOSIS — E871 Hypo-osmolality and hyponatremia: Secondary | ICD-10-CM | POA: Diagnosis present

## 2020-07-07 DIAGNOSIS — H9190 Unspecified hearing loss, unspecified ear: Secondary | ICD-10-CM

## 2020-07-07 DIAGNOSIS — Z86718 Personal history of other venous thrombosis and embolism: Secondary | ICD-10-CM

## 2020-07-07 DIAGNOSIS — I2699 Other pulmonary embolism without acute cor pulmonale: Secondary | ICD-10-CM | POA: Diagnosis present

## 2020-07-07 DIAGNOSIS — E875 Hyperkalemia: Secondary | ICD-10-CM | POA: Diagnosis present

## 2020-07-07 DIAGNOSIS — R110 Nausea: Secondary | ICD-10-CM | POA: Diagnosis not present

## 2020-07-07 DIAGNOSIS — R42 Dizziness and giddiness: Secondary | ICD-10-CM | POA: Diagnosis not present

## 2020-07-07 DIAGNOSIS — Z86711 Personal history of pulmonary embolism: Secondary | ICD-10-CM | POA: Diagnosis not present

## 2020-07-07 DIAGNOSIS — I2694 Multiple subsegmental pulmonary emboli without acute cor pulmonale: Secondary | ICD-10-CM | POA: Diagnosis not present

## 2020-07-07 DIAGNOSIS — N184 Chronic kidney disease, stage 4 (severe): Secondary | ICD-10-CM | POA: Insufficient documentation

## 2020-07-07 LAB — COMPREHENSIVE METABOLIC PANEL
ALT: 22 U/L (ref 0–44)
ALT: 17 U/L (ref 0–44)
AST: 25 U/L (ref 15–41)
AST: 21 U/L (ref 15–41)
Albumin: 3.8 g/dL (ref 3.5–5.0)
Albumin: 3.8 g/dL (ref 3.5–5.0)
Alkaline Phosphatase: 40 U/L (ref 38–126)
Alkaline Phosphatase: 42 U/L (ref 38–126)
Anion gap: 11 (ref 5–15)
Anion gap: 8 (ref 5–15)
BUN: 42 mg/dL — ABNORMAL HIGH (ref 6–20)
BUN: 42 mg/dL — ABNORMAL HIGH (ref 6–20)
CO2: 23 mmol/L (ref 22–32)
CO2: 28 mmol/L (ref 22–32)
Calcium: 9.4 mg/dL (ref 8.9–10.3)
Calcium: 9.4 mg/dL (ref 8.9–10.3)
Chloride: 95 mmol/L — ABNORMAL LOW (ref 98–111)
Chloride: 94 mmol/L — ABNORMAL LOW (ref 98–111)
Creatinine, Ser: 2.83 mg/dL — ABNORMAL HIGH (ref 0.61–1.24)
Creatinine, Ser: 2.88 mg/dL — ABNORMAL HIGH (ref 0.61–1.24)
GFR, Estimated: 26 mL/min — ABNORMAL LOW (ref >60)
GFR, Estimated: 26 mL/min — ABNORMAL LOW (ref >60)
Glucose, Bld: 376 mg/dL — ABNORMAL HIGH (ref 70–99)
Glucose, Bld: 400 mg/dL — ABNORMAL HIGH (ref 70–99)
Potassium: 5.5 mmol/L — ABNORMAL HIGH (ref 3.5–5.1)
Potassium: 5.1 mmol/L (ref 3.5–5.1)
Sodium: 129 mmol/L — ABNORMAL LOW (ref 135–145)
Sodium: 130 mmol/L — ABNORMAL LOW (ref 135–145)
Total Bilirubin: 1.1 mg/dL (ref 0.3–1.2)
Total Bilirubin: 1.2 mg/dL (ref 0.3–1.2)
Total Protein: 7.6 g/dL (ref 6.5–8.1)
Total Protein: 7.8 g/dL (ref 6.5–8.1)

## 2020-07-07 LAB — URINALYSIS, ROUTINE W REFLEX MICROSCOPIC
Bilirubin Urine: NEGATIVE
Glucose, UA: >=500 mg/dL — AB
Hgb urine dipstick: NEGATIVE
Ketones, ur: 5 mg/dL — AB
Leukocytes,Ua: NEGATIVE
Nitrite: NEGATIVE
Protein, ur: 100 mg/dL — AB
Specific Gravity, Urine: 1.02 (ref 1.005–1.030)
pH: 5 (ref 5.0–8.0)

## 2020-07-07 LAB — TROPONIN I (HIGH SENSITIVITY)
Troponin I (High Sensitivity): 38 ng/L — ABNORMAL HIGH (ref <18)
Troponin I (High Sensitivity): 47 ng/L — ABNORMAL HIGH (ref <18)

## 2020-07-07 LAB — CBC WITH DIFFERENTIAL/PLATELET
Abs Immature Granulocytes: 0.01 10*3/uL (ref 0.00–0.07)
Basophils Absolute: 0 10*3/uL (ref 0.0–0.1)
Basophils Relative: 1 %
Eosinophils Absolute: 0.1 10*3/uL (ref 0.0–0.5)
Eosinophils Relative: 1 %
HCT: 40 % (ref 39.0–52.0)
Hemoglobin: 12.4 g/dL — ABNORMAL LOW (ref 13.0–17.0)
Immature Granulocytes: 0 %
Lymphocytes Relative: 25 %
Lymphs Abs: 1.2 10*3/uL (ref 0.7–4.0)
MCH: 27.6 pg (ref 26.0–34.0)
MCHC: 31 g/dL (ref 30.0–36.0)
MCV: 88.9 fL (ref 80.0–100.0)
Monocytes Absolute: 0.4 10*3/uL (ref 0.1–1.0)
Monocytes Relative: 9 %
Neutro Abs: 3 10*3/uL (ref 1.7–7.7)
Neutrophils Relative %: 64 %
Platelets: 250 10*3/uL (ref 150–400)
RBC: 4.5 MIL/uL (ref 4.22–5.81)
RDW: 13.2 % (ref 11.5–15.5)
WBC: 4.7 10*3/uL (ref 4.0–10.5)
nRBC: 0 % (ref 0.0–0.2)

## 2020-07-07 LAB — RESP PANEL BY RT-PCR (FLU A&B, COVID) ARPGX2
Influenza A by PCR: NEGATIVE
Influenza B by PCR: NEGATIVE
SARS Coronavirus 2 by RT PCR: NEGATIVE

## 2020-07-07 LAB — BRAIN NATRIURETIC PEPTIDE: B Natriuretic Peptide: 336.3 pg/mL — ABNORMAL HIGH (ref 0.0–100.0)

## 2020-07-07 LAB — LIPASE, BLOOD
Lipase: 52 U/L — ABNORMAL HIGH (ref 11–51)
Lipase: 58 U/L — ABNORMAL HIGH (ref 11–51)

## 2020-07-07 MED ORDER — INSULIN ASPART 100 UNIT/ML ~~LOC~~ SOLN
0.0000 [IU] | Freq: Three times a day (TID) | SUBCUTANEOUS | Status: DC
  Administered 2020-07-08: 3 [IU] via SUBCUTANEOUS
  Administered 2020-07-08 (×2): 5 [IU] via SUBCUTANEOUS
  Administered 2020-07-09: 3 [IU] via SUBCUTANEOUS

## 2020-07-07 MED ORDER — LATANOPROST 0.005 % OP SOLN
1.0000 [drp] | Freq: Every day | OPHTHALMIC | Status: DC
  Administered 2020-07-08 (×2): 1 [drp] via OPHTHALMIC
  Filled 2020-07-07 (×2): qty 2.5

## 2020-07-07 MED ORDER — ONDANSETRON 4 MG PO TBDP
4.0000 mg | ORAL_TABLET | Freq: Once | ORAL | Status: AC
  Administered 2020-07-07: 4 mg via ORAL
  Filled 2020-07-07: qty 1

## 2020-07-07 MED ORDER — HYDRALAZINE HCL 50 MG PO TABS
100.0000 mg | ORAL_TABLET | Freq: Three times a day (TID) | ORAL | Status: DC
  Administered 2020-07-08 – 2020-07-09 (×4): 100 mg via ORAL
  Filled 2020-07-07 (×4): qty 2

## 2020-07-07 MED ORDER — ONDANSETRON HCL 4 MG/2ML IJ SOLN
4.0000 mg | Freq: Once | INTRAMUSCULAR | Status: DC
  Filled 2020-07-07: qty 2

## 2020-07-07 MED ORDER — SODIUM CHLORIDE 0.9 % IV BOLUS
500.0000 mL | Freq: Once | INTRAVENOUS | Status: AC
  Administered 2020-07-07: 500 mL via INTRAVENOUS

## 2020-07-07 MED ORDER — FENOFIBRATE 54 MG PO TABS
54.0000 mg | ORAL_TABLET | Freq: Every day | ORAL | Status: DC
  Administered 2020-07-08 – 2020-07-09 (×2): 54 mg via ORAL
  Filled 2020-07-07 (×2): qty 1

## 2020-07-07 MED ORDER — APIXABAN 5 MG PO TABS
5.0000 mg | ORAL_TABLET | Freq: Two times a day (BID) | ORAL | Status: DC
  Administered 2020-07-08 – 2020-07-09 (×4): 5 mg via ORAL
  Filled 2020-07-07 (×4): qty 1

## 2020-07-07 MED ORDER — ACETAMINOPHEN 650 MG RE SUPP: 650.0000 mg | Freq: Four times a day (QID) | RECTAL | Status: DC | PRN

## 2020-07-07 MED ORDER — AMIODARONE HCL 100 MG PO TABS
100.0000 mg | ORAL_TABLET | Freq: Every day | ORAL | Status: DC
  Administered 2020-07-08 – 2020-07-09 (×2): 100 mg via ORAL
  Filled 2020-07-07 (×2): qty 1

## 2020-07-07 MED ORDER — ACETAMINOPHEN 325 MG PO TABS
650.0000 mg | ORAL_TABLET | Freq: Four times a day (QID) | ORAL | Status: DC | PRN
  Administered 2020-07-08: 650 mg via ORAL
  Filled 2020-07-07: qty 2

## 2020-07-07 MED ORDER — ISOSORBIDE MONONITRATE ER 60 MG PO TB24
60.0000 mg | ORAL_TABLET | Freq: Every day | ORAL | Status: DC
  Administered 2020-07-08 – 2020-07-09 (×2): 60 mg via ORAL
  Filled 2020-07-07 (×2): qty 1

## 2020-07-07 MED ORDER — ICOSAPENT ETHYL 1 G PO CAPS
2.0000 g | ORAL_CAPSULE | Freq: Two times a day (BID) | ORAL | Status: DC
  Administered 2020-07-08 – 2020-07-09 (×4): 2 g via ORAL
  Filled 2020-07-07 (×6): qty 2

## 2020-07-07 MED ORDER — DICYCLOMINE HCL 20 MG PO TABS
20.0000 mg | ORAL_TABLET | Freq: Two times a day (BID) | ORAL | Status: DC
  Administered 2020-07-08 – 2020-07-09 (×4): 20 mg via ORAL
  Filled 2020-07-07 (×5): qty 1

## 2020-07-07 MED ORDER — CARVEDILOL 25 MG PO TABS
25.0000 mg | ORAL_TABLET | Freq: Two times a day (BID) | ORAL | Status: DC
  Administered 2020-07-08 – 2020-07-09 (×3): 25 mg via ORAL
  Filled 2020-07-07 (×3): qty 1

## 2020-07-07 MED ORDER — DICYCLOMINE HCL 10 MG/ML IM SOLN
20.0000 mg | Freq: Once | INTRAMUSCULAR | Status: AC
  Administered 2020-07-07: 20 mg via INTRAMUSCULAR
  Filled 2020-07-07: qty 2

## 2020-07-07 MED ORDER — PANTOPRAZOLE SODIUM 40 MG PO TBEC
40.0000 mg | DELAYED_RELEASE_TABLET | Freq: Two times a day (BID) | ORAL | Status: DC
  Administered 2020-07-08 – 2020-07-09 (×4): 40 mg via ORAL
  Filled 2020-07-07 (×4): qty 1

## 2020-07-07 MED ORDER — PANTOPRAZOLE SODIUM 40 MG PO TBEC: 40.0000 mg | DELAYED_RELEASE_TABLET | Freq: Every day | ORAL | Status: DC

## 2020-07-07 MED ORDER — INSULIN GLARGINE 100 UNIT/ML ~~LOC~~ SOLN
24.0000 [IU] | Freq: Every day | SUBCUTANEOUS | Status: DC
  Administered 2020-07-07 – 2020-07-08 (×2): 24 [IU] via SUBCUTANEOUS
  Filled 2020-07-07 (×3): qty 0.24

## 2020-07-07 MED ORDER — FUROSEMIDE 10 MG/ML IJ SOLN
40.0000 mg | Freq: Two times a day (BID) | INTRAMUSCULAR | Status: DC
  Administered 2020-07-08: 40 mg via INTRAVENOUS
  Filled 2020-07-07: qty 4

## 2020-07-07 MED ORDER — ATORVASTATIN CALCIUM 80 MG PO TABS
80.0000 mg | ORAL_TABLET | Freq: Every day | ORAL | Status: DC
  Administered 2020-07-08 (×2): 80 mg via ORAL
  Filled 2020-07-07 (×2): qty 1

## 2020-07-07 NOTE — ED Notes (Signed)
Patient transported to X-ray 

## 2020-07-07 NOTE — H&P (Signed)
History and Physical    Troy Rush [id number] DOB: 17-Aug-1970 DOA: 07/07/2020  PCP: [name], FNP  Patient coming from: Home.  Chief Complaint: Shortness of breath nausea vomiting abdominal pain dizziness.  HPI: Troy Rush is a 50 y.o. male with history of chronic systolic heart failure last EF measured was 45% with grade 1 diastolic dysfunction in October 2021 with history of A. fib pulmonary embolism on Eliquis amiodarone, diabetes mellitus, chronic kidney disease stage III baseline creatinine around 2.6 previous history of pancreatitis presents to the ER with worsening shortness of breath with orthopnea and also having abdominal pain mostly in the epigastric area with 2-3 episodes of vomiting over the last 24 hours.  Denies any diarrhea has been except constipation.  Pain is mostly in the epigastric area.  Has been feeling some dizziness over the last 2 days.  Did not have any weakness of the extremities.  ED Course: In the ER chest x-ray was unremarkable labs were significant for BNP of 336 blood glucose of 376 eventually it was 400.  Creatinine is 2.8 potassium 5.5 sodium 129 EKG shows normal sinus rhythm.  Covid test was negative.  CT abdomen pelvis was unremarkable lipase is mildly elevated.  Patient was able to tolerate diet in the ER.  Was given Lasix and admitted for acute CHF.  Review of Systems: As per HPI, rest all negative.   Past Medical History:  Diagnosis Date  . AICD (automatic cardioverter/defibrillator) present 10/14/2016  . Bilateral deafness   . Bronchitis   . CHF (congestive heart failure) ([place])   . Depression   . Diabetes mellitus without complication ([place])   . DVT, lower extremity ([place]) 07/12/2019  . Dyslipidemia   . Headache 08/2019  . Hypertension   . ICD (implantable cardioverter-defibrillator) in place   . Insomnia 08/2019  . NICM (nonischemic cardiomyopathy) ([place]) 09/15/2016  . Pancreatitis   . Sleep apnea   . Vitamin D deficiency  08/2019    Past Surgical History:  Procedure Laterality Date  . BACK SURGERY    . CARDIAC CATHETERIZATION    . ESOPHAGOGASTRODUODENOSCOPY (EGD) WITH PROPOFOL N/A 02/02/2020   Procedure: ESOPHAGOGASTRODUODENOSCOPY (EGD) WITH PROPOFOL;  Surgeon: Arta Silence, MD;  Location: [facility];  Service: Endoscopy;  Laterality: N/A;  . ICD IMPLANT  10/14/2016  . ICD IMPLANT N/A 10/14/2016   Procedure: ICD Implant;  Surgeon: Deboraha Sprang, MD;  Location: [facility];  Service: Cardiovascular;  Laterality: N/A;  . RIGHT HEART CATH N/A 05/21/2020   Procedure: RIGHT HEART CATH;  Surgeon: Larey Dresser, MD;  Location: [facility];  Service: Cardiovascular;  Laterality: N/A;  . WRIST SURGERY       reports that he has never smoked. He has never used smokeless tobacco. He reports current alcohol use of about 1.0 - 2.0 standard drink of alcohol per week. He reports that he does not use drugs.  Allergies  Allergen Reactions  . Lisinopril Cough    Family History  Problem Relation Age of Onset  . Hypertension Mother   . Healthy Father   . Stroke Father     Prior to Admission medications   Medication Sig Start Date End Date Taking? Authorizing Provider  amiodarone (PACERONE) 200 MG tablet Take 0.5 tablets (100 mg total) by mouth daily. 05/19/20  Yes Larey Dresser, MD  atorvastatin (LIPITOR) 80 MG tablet Take 1 tablet (80 mg total) by mouth at bedtime. 03/18/20  Yes Larey Dresser,  MD  carvedilol (COREG) 25 MG tablet Take 1 tablet (25 mg total) by mouth 2 (two) times daily with a meal. 04/15/20  Yes Larey Dresser, MD  dapagliflozin propanediol (FARXIGA) 10 MG TABS tablet Take 1 tablet (10 mg total) by mouth daily before breakfast. 04/16/20  Yes [place], Maricela Bo, FNP  dicyclomine (BENTYL) 20 MG tablet Take 1 tablet (20 mg total) by mouth 2 (two) times daily. 06/23/20  Yes Marcello Fennel, PA-C  ELIQUIS 5 MG TABS tablet TAKE 1 TABLET (5 MG TOTAL) BY MOUTH 2 (TWO) TIMES DAILY.  07/06/20  Yes Larey Dresser, MD  fenofibrate 54 MG tablet TAKE 1 TABLET (54 MG TOTAL) BY MOUTH DAILY. 07/06/20 10/04/20 Yes Larey Dresser, MD  furosemide (LASIX) 40 MG tablet Take 40 mg by mouth in the morning.   Yes [provider]  hydrALAZINE (APRESOLINE) 100 MG tablet Take 1 tablet (100 mg total) by mouth 3 (three) times daily. 04/16/20  Yes [place], Maricela Bo, FNP  insulin glargine (LANTUS) 100 UNIT/ML injection Inject 0.24 mLs (24 Units total) into the skin daily. 02/04/20  Yes Sheikh, Omair Latif, DO  isosorbide mononitrate (IMDUR) 60 MG 24 hr tablet Take 1 tablet (60 mg total) by mouth daily. 04/09/20  Yes Larey Dresser, MD  Lancets MISC 1 each by Does not apply route 3 (three) times daily as needed. 03/11/20  Yes [name], FNP  latanoprost (XALATAN) 0.005 % ophthalmic solution Place 1 drop into both eyes at bedtime. 01/20/20  Yes [provider]  pantoprazole (PROTONIX) 40 MG tablet Take 1 tablet (40 mg total) by mouth 2 (two) times daily. 03/25/20  Yes [name], FNP  VASCEPA 1 g capsule TAKE TWO CAPSULES BY MOUTH TWICE A DAY 07/06/20  Yes Larey Dresser, MD  omeprazole (PRILOSEC) 20 MG capsule Take 1 capsule (20 mg total) by mouth daily. 06/23/20 07/23/20  Marcello Fennel, PA-C    Physical Exam: Constitutional: Moderately built and nourished. Vitals:   07/07/20 1754 07/07/20 1800 07/07/20 1930 07/07/20 2145  BP: 124/84 140/79 134/74 (!) 157/76  Pulse: 74 74 81 78  Resp: '19 17 15 16  '$ Temp:      TempSrc:      SpO2: 100% 100% 99% 100%  Weight:      Height:       Eyes: Anicteric no pallor. ENMT: No discharge from the ears eyes nose or mouth. Neck: No mass felt.  No neck rigidity.  No JVD appreciated. Respiratory: No rhonchi or crepitations. Cardiovascular: S1-S2 heard. Abdomen: Soft nontender bowel sounds present. Musculoskeletal: No edema. Skin: No rash. Neurologic: Alert awake oriented to time place and person.  Moves all  extremities. Psychiatric: Appears normal.  Normal affect.   Labs on Admission: I have personally reviewed following labs and imaging studies  CBC: Recent Labs  Lab 07/07/20 1434  WBC 4.7  NEUTROABS 3.0  HGB 12.4*  HCT 40.0  MCV 88.9  PLT [id number]   Basic Metabolic Panel: Recent Labs  Lab 07/07/20 1534 07/07/20 2000  NA 129* 130*  K 5.5* 5.1  CL 95* 94*  CO2 23 28  GLUCOSE 376* 400*  BUN 42* 42*  CREATININE 2.83* 2.88*  CALCIUM 9.4 9.4   GFR: Estimated Creatinine Clearance: 37.3 mL/min (A) (by C-G formula based on SCr of 2.88 mg/dL (H)). Liver Function Tests: Recent Labs  Lab 07/07/20 1534 07/07/20 2000  AST 25 21  ALT 22 17  ALKPHOS 40 42  BILITOT 1.1  1.2  PROT 7.6 7.8  ALBUMIN 3.8 3.8   Recent Labs  Lab 07/07/20 1534 07/07/20 2000  LIPASE 52* 58*   No results for input(s): AMMONIA in the last 168 hours. Coagulation Profile: No results for input(s): INR, PROTIME in the last 168 hours. Cardiac Enzymes: No results for input(s): CKTOTAL, CKMB, CKMBINDEX, TROPONINI in the last 168 hours. BNP (last 3 results) No results for input(s): PROBNP in the last 8760 hours. HbA1C: No results for input(s): HGBA1C in the last 72 hours. CBG: No results for input(s): GLUCAP in the last 168 hours. Lipid Profile: No results for input(s): CHOL, HDL, LDLCALC, TRIG, CHOLHDL, LDLDIRECT in the last 72 hours. Thyroid Function Tests: No results for input(s): TSH, T4TOTAL, FREET4, T3FREE, THYROIDAB in the last 72 hours. Anemia Panel: No results for input(s): VITAMINB12, FOLATE, FERRITIN, TIBC, IRON, RETICCTPCT in the last 72 hours. Urine analysis:    Component Value Date/Time   COLORURINE YELLOW 07/07/2020 1505   APPEARANCEUR HAZY (A) 07/07/2020 1505   LABSPEC 1.020 07/07/2020 1505   PHURINE 5.0 07/07/2020 1505   GLUCOSEU >=500 (A) 07/07/2020 1505   HGBUR NEGATIVE 07/07/2020 1505   BILIRUBINUR NEGATIVE 07/07/2020 1505   BILIRUBINUR Negative 08/13/2019 1103   KETONESUR 5  (A) 07/07/2020 1505   PROTEINUR 100 (A) 07/07/2020 1505   UROBILINOGEN 0.2 08/13/2019 1103   UROBILINOGEN 0.2 05/09/2017 1130   NITRITE NEGATIVE 07/07/2020 1505   LEUKOCYTESUR NEGATIVE 07/07/2020 1505   Sepsis Labs: '@LABRCNTIP'$ (procalcitonin:4,lacticidven:4) ) Recent Results (from the past 240 hour(s))  Resp Panel by RT-PCR (Flu A&B, Covid) Nasopharyngeal Swab     Status: None   Collection Time: 07/07/20  1:36 PM   Specimen: Nasopharyngeal Swab; Nasopharyngeal(NP) swabs in vial transport medium  Result Value Ref Range Status   SARS Coronavirus 2 by RT PCR NEGATIVE NEGATIVE Final    Comment: (NOTE) SARS-CoV-2 target nucleic acids are NOT DETECTED.  The SARS-CoV-2 RNA is generally detectable in upper respiratory specimens during the acute phase of infection. The lowest concentration of SARS-CoV-2 viral copies this assay can detect is 138 copies/mL. A negative result does not preclude SARS-Cov-2 infection and should not be used as the sole basis for treatment or other patient management decisions. A negative result may occur with  improper specimen collection/handling, submission of specimen other than nasopharyngeal swab, presence of viral mutation(s) within the areas targeted by this assay, and inadequate number of viral copies(<138 copies/mL). A negative result must be combined with clinical observations, patient history, and epidemiological information. The expected result is Negative.  Fact Sheet for Patients:  EntrepreneurPulse.com.au  Fact Sheet for Healthcare Providers:  IncredibleEmployment.be  This test is no t yet approved or cleared by the [place] FDA and  has been authorized for detection and/or diagnosis of SARS-CoV-2 by FDA under an Emergency Use Authorization (EUA). This EUA will remain  in effect (meaning this test can be used) for the duration of the COVID-19 declaration under Section 564(b)(1) of the Act,  21 U.S.C.section 360bbb-3(b)(1), unless the authorization is terminated  or revoked sooner.       Influenza A by PCR NEGATIVE NEGATIVE Final   Influenza B by PCR NEGATIVE NEGATIVE Final    Comment: (NOTE) The Xpert Xpress SARS-CoV-2/FLU/RSV plus assay is intended as an aid in the diagnosis of influenza from Nasopharyngeal swab specimens and should not be used as a sole basis for treatment. Nasal washings and aspirates are unacceptable for Xpert Xpress SARS-CoV-2/FLU/RSV testing.  Fact Sheet for Patients: EntrepreneurPulse.com.au  Fact Sheet for  Healthcare Providers: IncredibleEmployment.be  This test is not yet approved or cleared by the [place] and has been authorized for detection and/or diagnosis of SARS-CoV-2 by FDA under an Emergency Use Authorization (EUA). This EUA will remain in effect (meaning this test can be used) for the duration of the COVID-19 declaration under Section 564(b)(1) of the Act, 21 U.S.C. section 360bbb-3(b)(1), unless the authorization is terminated or revoked.  Performed at [facility], [place] [address] [place], [place] [address]      Radiological Exams on Admission: CT ABDOMEN PELVIS WO CONTRAST  Result Date: 07/07/2020 CLINICAL DATA:  Generalized abdominal pain, nausea, dizziness EXAM: CT ABDOMEN AND PELVIS WITHOUT CONTRAST TECHNIQUE: Multidetector CT imaging of the abdomen and pelvis was performed following the standard protocol without IV contrast. COMPARISON:  06/23/2020 FINDINGS: Lower chest: No acute pleural or parenchymal lung disease. Unenhanced CT was performed per clinician order. Lack of IV contrast limits sensitivity and specificity, especially for evaluation of abdominal/pelvic solid viscera. Hepatobiliary: No focal liver abnormality is seen. No gallstones, gallbladder wall thickening, or biliary dilatation. Pancreas: Unremarkable. No pancreatic ductal dilatation or surrounding  inflammatory changes. Spleen: Normal in size without focal abnormality. Adrenals/Urinary Tract: No urinary tract calculi or obstructive uropathy. The adrenals and bladder are unremarkable. Stomach/Bowel: No bowel obstruction or ileus. Normal appendix right lower quadrant. No bowel wall thickening or inflammatory change. Vascular/Lymphatic: Aortic atherosclerosis. No enlarged abdominal or pelvic lymph nodes. Reproductive: Prostate is unremarkable. Other: No free fluid or free gas. Stable small umbilical hernia containing a portion of small bowel. No bowel incarceration or obstruction. Musculoskeletal: No acute or destructive bony lesions. Reconstructed images demonstrate no additional findings. IMPRESSION: 1. No acute intra-abdominal or intrapelvic process. 2. Stable small umbilical hernia. 3.  Aortic Atherosclerosis (ICD10-I70.0). Electronically Signed   By: Randa Ngo M.D.   On: 07/07/2020 20:53   DG Chest 1 View  Result Date: 07/07/2020 CLINICAL DATA:  Chest pain. EXAM: CHEST  1 VIEW COMPARISON:  June 23, 2020. FINDINGS: Stable cardiomediastinal silhouette. Left-sided pacemaker is unchanged in position. No pneumothorax or pleural effusion is noted. No acute pulmonary disease is noted. Bony thorax is unremarkable. IMPRESSION: No active disease. Electronically Signed   By: Marijo Conception M.D.   On: 07/07/2020 14:24    EKG: Independently reviewed.  Normal sinus rhythm.  Assessment/Plan Principal Problem:   Acute CHF (congestive heart failure) (HCC) Active Problems:   NICM (nonischemic cardiomyopathy) (HCC)   Abdominal pain   Type 2 diabetes mellitus with complication, with long-term current use of insulin (HCC)   Pulmonary embolism (HCC)   CKD (chronic kidney disease), stage IV ([place])    1. Acute on chronic systolic and diastolic CHF last EF measured in October 2021 was 45% with grade 1 diastolic dysfunction presently on Lasix 40 mg IV every 12.  Closely follow intake output metabolic panel  daily weights.  Has had recent cardiac cath by Dr. Marigene Ehlers on May 21, 2020.  Showed optimize pressures.  History of ICD placement. 2. Abdominal pain with nausea vomiting with mildly elevated lipase CT abdomen pelvis was unremarkable.  Patient is able to tolerate diet.  Closely monitor. 3. Diabetes mellitus type 2 with hyperglycemia patient states he has missed his dose of Lantus history likely causing his hyperglycemia I have ordered his home dose of Lantus closely follow CBGs with sliding scale coverage. 4. Dizziness cause not clear appears nonfocal continue to monitor. 5. History of A. fib and PE DVT on apixaban.  On amiodarone  and beta-blockers. 6. Hypertension on carvedilol hydralazine Imdur. 7. Hyperlipidemia on statins. 8. Anemia follow CBC. 9. Chronic kidney disease stage III creatinine appears to be at better on baseline.  Follow metabolic panel closely.   DVT prophylaxis: Apixaban.  Note that patient has renal failure dose may need to be adjusted if renal failure worsens. Code Status: Full code. Family Communication: Discussed with patient. Disposition Plan: Home. Consults called: None. Admission status: Inpatient.   Rise Patience MD [facility] Pager [phone].  If 7PM-7AM, please contact night-coverage [email or web address] Password [facility]  07/07/2020, 11:25 PM

## 2020-07-07 NOTE — ED Notes (Signed)
Patient transported to CT 

## 2020-07-07 NOTE — ED Provider Notes (Signed)
[facility] EMERGENCY DEPARTMENT Provider Note   CSN: [id number] Arrival date & time: 07/07/20  1214     History Chief Complaint  Patient presents with  . Abdominal Pain  . Dizziness  . Nausea    KEANTE TROJANOWSKI is a 50 y.o. male with a past medical history significant for CAD, ACID in place, CHF with EF of 45%, A. fib, history of PE on chronic Eliquis, hypertension, diabetes who presents to the ED due to abdominal pain and dizziness. Patient admits to generalized abdominal pain associated with nausea. Patient was recently seen in the ED on 3/15 for same where a CT abdomen was performed which was negative for any acute abnormalities. Last BM 2 days ago which was smaller and harder than normal. No previous abdominal operations. Denies emesis prior to arrival; however during my initial evaluation patient actively vomiting. Denies urinary symptoms. He describes his pain as a cramping-like sensation.  Patient also admits to dizziness which is worse when going from sitting to standing position; however is all the time. Denies history of CVA. Denies unilateral weakness and changes to vision. He describes the dizziness as both the room spinning around him and feeling off-balance.   Patient also notes he is having intermittent chest pain and shortness of breath located in the substernal region which has resolved.  History obtained from patient and past medical records. Official sign language interpretor used during entire encounter.      Past Medical History:  Diagnosis Date  . AICD (automatic cardioverter/defibrillator) present 10/14/2016  . Bilateral deafness   . Bronchitis   . CHF (congestive heart failure) ([place])   . Depression   . Diabetes mellitus without complication ([place])   . DVT, lower extremity ([place]) 07/12/2019  . Dyslipidemia   . Headache 08/2019  . Hypertension   . ICD (implantable cardioverter-defibrillator) in place   . Insomnia 08/2019  . NICM  (nonischemic cardiomyopathy) ([place]) 09/15/2016  . Pancreatitis   . Sleep apnea   . Vitamin D deficiency 08/2019    Patient Active Problem List   Diagnosis Date Noted  . ICD (implantable cardioverter-defibrillator) in place 05/14/2020  . Pulmonary embolism ([place]) 02/05/2020  . Acute kidney injury superimposed on CKD ([place])   . Chronic combined systolic and diastolic heart failure ([place])   . Atrial fibrillation with RVR ([place]) 02/01/2020  . NSVT (nonsustained ventricular tachycardia) ([place]) 02/01/2020  . Acute GI bleeding 02/01/2020  . DKA (diabetic ketoacidosis) ([place]) 02/01/2020  . Bilateral deafness 08/15/2019  . Type 2 diabetes mellitus with complication, with long-term current use of insulin ([place]) 08/15/2019  . Hemoglobin A1C between 7% and 9% indicating borderline diabetic control 08/15/2019  . Insomnia 08/15/2019  . Abdominal pain 11/14/2018  . Ischemic cardiomyopathy 10/14/2016  . NICM (nonischemic cardiomyopathy) ([place]) 09/15/2016  . Chronic systolic CHF (congestive heart failure) ([place]) 05/30/2016  . HTN (hypertension) 05/30/2016  . Deaf 05/30/2016  . Hyperkalemia 05/30/2016  . Diabetes ([place]) 03/21/2016  . Major depressive disorder, recurrent episode ([place]) 03/21/2016    Past Surgical History:  Procedure Laterality Date  . BACK SURGERY    . CARDIAC CATHETERIZATION    . ESOPHAGOGASTRODUODENOSCOPY (EGD) WITH PROPOFOL N/A 02/02/2020   Procedure: ESOPHAGOGASTRODUODENOSCOPY (EGD) WITH PROPOFOL;  Surgeon: Arta Silence, MD;  Location: [facility];  Service: Endoscopy;  Laterality: N/A;  . ICD IMPLANT  10/14/2016  . ICD IMPLANT N/A 10/14/2016   Procedure: ICD Implant;  Surgeon: Deboraha Sprang, MD;  Location: [facility];  Service: Cardiovascular;  Laterality: N/A;  . RIGHT HEART CATH N/A 05/21/2020   Procedure: RIGHT HEART CATH;  Surgeon: Larey Dresser, MD;  Location: [facility];  Service: Cardiovascular;  Laterality: N/A;  . WRIST SURGERY         Family History   Problem Relation Age of Onset  . Hypertension Mother   . Healthy Father   . Stroke Father     Social History   Tobacco Use  . Smoking status: Never Smoker  . Smokeless tobacco: Never Used  Vaping Use  . Vaping Use: Never used  Substance Use Topics  . Alcohol use: Yes    Alcohol/week: 1.0 - 2.0 standard drink    Types: 1 - 2 Glasses of wine per week    Comment: rare  . Drug use: No    Home Medications Prior to Admission medications   Medication Sig Start Date End Date Taking? Authorizing Provider  amiodarone (PACERONE) 200 MG tablet Take 0.5 tablets (100 mg total) by mouth daily. 05/19/20   Larey Dresser, MD  atorvastatin (LIPITOR) 80 MG tablet Take 1 tablet (80 mg total) by mouth at bedtime. 03/18/20   Larey Dresser, MD  carvedilol (COREG) 25 MG tablet Take 1 tablet (25 mg total) by mouth 2 (two) times daily with a meal. 04/15/20   Larey Dresser, MD  dapagliflozin propanediol (FARXIGA) 10 MG TABS tablet Take 1 tablet (10 mg total) by mouth daily before breakfast. 04/16/20   [place], Maricela Bo, FNP  dicyclomine (BENTYL) 20 MG tablet Take 1 tablet (20 mg total) by mouth 2 (two) times daily. 06/23/20   Marcello Fennel, PA-C  ELIQUIS 5 MG TABS tablet TAKE 1 TABLET (5 MG TOTAL) BY MOUTH 2 (TWO) TIMES DAILY. 07/06/20   Larey Dresser, MD  fenofibrate 54 MG tablet TAKE 1 TABLET (54 MG TOTAL) BY MOUTH DAILY. 07/06/20 10/04/20  Larey Dresser, MD  furosemide (LASIX) 40 MG tablet Take 40 mg by mouth in the morning.    [provider]  hydrALAZINE (APRESOLINE) 100 MG tablet Take 1 tablet (100 mg total) by mouth 3 (three) times daily. 04/16/20   [place], Maricela Bo, FNP  insulin glargine (LANTUS) 100 UNIT/ML injection Inject 0.24 mLs (24 Units total) into the skin daily. 02/04/20   Raiford Noble Latif, DO  isosorbide mononitrate (IMDUR) 60 MG 24 hr tablet Take 1 tablet (60 mg total) by mouth daily. 04/09/20   Larey Dresser, MD  Lancets MISC 1 each by Does not apply route 3  (three) times daily as needed. 03/11/20   [name], FNP  latanoprost (XALATAN) 0.005 % ophthalmic solution Place 1 drop into both eyes at bedtime. 01/20/20   [provider]  omeprazole (PRILOSEC) 20 MG capsule Take 1 capsule (20 mg total) by mouth daily. Patient not taking: Reported on 06/24/2020 06/23/20 07/23/20  Marcello Fennel, PA-C  pantoprazole (PROTONIX) 40 MG tablet Take 1 tablet (40 mg total) by mouth 2 (two) times daily. 03/25/20   [name], FNP  VASCEPA 1 g capsule TAKE TWO CAPSULES BY MOUTH TWICE A DAY 07/06/20   Larey Dresser, MD    Allergies    Lisinopril  Review of Systems   Review of Systems  Constitutional: Negative for chills and fever.  Respiratory: Positive for shortness of breath.   Cardiovascular: Positive for chest pain. Negative for leg swelling.  Gastrointestinal: Positive for abdominal pain, nausea and vomiting. Negative for diarrhea.  Genitourinary: Negative for dysuria.  Neurological: Positive for dizziness. Negative for syncope and facial asymmetry.  All other systems reviewed and are negative.   Physical Exam Updated Vital Signs BP (!) 157/76   Pulse 78   Temp 97.8 F (36.6 C) (Oral)   Resp 16   Ht '5\' 10"'$  (1.778 m)   Wt 107.5 kg   SpO2 100%   BMI 34.01 kg/m   Physical Exam Vitals and nursing note reviewed.  Constitutional:      General: He is not in acute distress. HENT:     Head: Normocephalic.  Eyes:     Pupils: Pupils are equal, round, and reactive to light.  Cardiovascular:     Rate and Rhythm: Normal rate and regular rhythm.     Pulses: Normal pulses.     Heart sounds: Normal heart sounds. No murmur heard. No friction rub. No gallop.   Pulmonary:     Effort: Pulmonary effort is normal.     Breath sounds: Normal breath sounds.  Chest:     Comments: Reproducible anterior chest wall tenderness.  No crepitus or deformity. Abdominal:     General: Abdomen is flat. Bowel sounds are normal. There is no  distension.     Palpations: Abdomen is soft.     Tenderness: There is abdominal tenderness. There is guarding. There is no rebound.     Comments: Diffuse abdominal tenderness with voluntary guarding.  No rebound.  No focal tenderness.  Musculoskeletal:     Cervical back: Neck supple.     Comments: No lower extremity edema.  Skin:    General: Skin is warm and dry.  Neurological:     General: No focal deficit present.     Comments: Patient is mute and deaf able to follow commands CN III-XII intact Normal strength in upper and lower extremities bilaterally including dorsiflexion and plantar flexion, strong and equal grip strength Sensation grossly intact throughout Moves extremities without ataxia, coordination intact No pronator drift Ambulates without difficulty   Psychiatric:        Mood and Affect: Mood normal.        Behavior: Behavior normal.     ED Results / Procedures / Treatments   Labs (all labs ordered are listed, but only abnormal results are displayed) Labs Reviewed  CBC WITH DIFFERENTIAL/PLATELET - Abnormal; Notable for the following components:      Result Value   Hemoglobin 12.4 (*)    All other components within normal limits  URINALYSIS, ROUTINE W REFLEX MICROSCOPIC - Abnormal; Notable for the following components:   APPearance HAZY (*)    Glucose, UA >=500 (*)    Ketones, ur 5 (*)    Protein, ur 100 (*)    Bacteria, UA MANY (*)    All other components within normal limits  BRAIN NATRIURETIC PEPTIDE - Abnormal; Notable for the following components:   B Natriuretic Peptide 336.3 (*)    All other components within normal limits  COMPREHENSIVE METABOLIC PANEL - Abnormal; Notable for the following components:   Sodium 130 (*)    Chloride 94 (*)    Glucose, Bld 400 (*)    BUN 42 (*)    Creatinine, Ser 2.88 (*)    GFR, Estimated 26 (*)    All other components within normal limits  LIPASE, BLOOD - Abnormal; Notable for the following components:   Lipase 58  (*)    All other components within normal limits  COMPREHENSIVE METABOLIC PANEL - Abnormal; Notable for the following components:   Sodium  129 (*)    Potassium 5.5 (*)    Chloride 95 (*)    Glucose, Bld 376 (*)    BUN 42 (*)    Creatinine, Ser 2.83 (*)    GFR, Estimated 26 (*)    All other components within normal limits  LIPASE, BLOOD - Abnormal; Notable for the following components:   Lipase 52 (*)    All other components within normal limits  TROPONIN I (HIGH SENSITIVITY) - Abnormal; Notable for the following components:   Troponin I (High Sensitivity) 38 (*)    All other components within normal limits  TROPONIN I (HIGH SENSITIVITY) - Abnormal; Notable for the following components:   Troponin I (High Sensitivity) 47 (*)    All other components within normal limits  RESP PANEL BY RT-PCR (FLU A&B, COVID) [id number]    EKG EKG Interpretation  Date/Time:  Tuesday July 07 2020 15:28:57 EDT Ventricular Rate:  80 PR Interval:  182 QRS Duration: 98 QT Interval:  434 QTC Calculation: 500 R Axis:   21 Text Interpretation: Normal sinus rhythm Right atrial enlargement Moderate voltage criteria for LVH, may be normal variant ( R in aVL , Cornell product ) Possible Anterior infarct , age undetermined Prolonged QT Abnormal ECG NSR, QTC prolonged Confirmed by Lavenia Atlas [phone]) on 07/07/2020 3:34:17 PM   Radiology CT ABDOMEN PELVIS WO CONTRAST  Result Date: 07/07/2020 CLINICAL DATA:  Generalized abdominal pain, nausea, dizziness EXAM: CT ABDOMEN AND PELVIS WITHOUT CONTRAST TECHNIQUE: Multidetector CT imaging of the abdomen and pelvis was performed following the standard protocol without IV contrast. COMPARISON:  06/23/2020 FINDINGS: Lower chest: No acute pleural or parenchymal lung disease. Unenhanced CT was performed per clinician order. Lack of IV contrast limits sensitivity and specificity, especially for evaluation of abdominal/pelvic solid viscera. Hepatobiliary: No focal liver  abnormality is seen. No gallstones, gallbladder wall thickening, or biliary dilatation. Pancreas: Unremarkable. No pancreatic ductal dilatation or surrounding inflammatory changes. Spleen: Normal in size without focal abnormality. Adrenals/Urinary Tract: No urinary tract calculi or obstructive uropathy. The adrenals and bladder are unremarkable. Stomach/Bowel: No bowel obstruction or ileus. Normal appendix right lower quadrant. No bowel wall thickening or inflammatory change. Vascular/Lymphatic: Aortic atherosclerosis. No enlarged abdominal or pelvic lymph nodes. Reproductive: Prostate is unremarkable. Other: No free fluid or free gas. Stable small umbilical hernia containing a portion of small bowel. No bowel incarceration or obstruction. Musculoskeletal: No acute or destructive bony lesions. Reconstructed images demonstrate no additional findings. IMPRESSION: 1. No acute intra-abdominal or intrapelvic process. 2. Stable small umbilical hernia. 3.  Aortic Atherosclerosis (ICD10-I70.0). Electronically Signed   By: Randa Ngo M.D.   On: 07/07/2020 20:53   DG Chest 1 View  Result Date: 07/07/2020 CLINICAL DATA:  Chest pain. EXAM: CHEST  1 VIEW COMPARISON:  June 23, 2020. FINDINGS: Stable cardiomediastinal silhouette. Left-sided pacemaker is unchanged in position. No pneumothorax or pleural effusion is noted. No acute pulmonary disease is noted. Bony thorax is unremarkable. IMPRESSION: No active disease. Electronically Signed   By: Marijo Conception M.D.   On: 07/07/2020 14:24    Procedures Procedures   Medications Ordered in ED Medications  ondansetron (ZOFRAN) injection 4 mg (0 mg Intravenous Hold 07/07/20 1453)  dicyclomine (BENTYL) injection 20 mg (20 mg Intramuscular Given 07/07/20 1504)  ondansetron (ZOFRAN-ODT) disintegrating tablet 4 mg (4 mg Oral Given 07/07/20 1500)  sodium chloride 0.9 % bolus 500 mL (500 mLs Intravenous New Bag/Given 07/07/20 2016)    ED Course  I have reviewed the triage  vital signs and the nursing notes.  Pertinent labs & imaging results that were available during my care of the patient were reviewed by me and considered in my medical decision making (see chart for details).  Clinical Course as of 07/07/20 2200  Tue Jul 07, 2020  1634 Informed by RN that labs hemolyzed. Labs will be redrawn. [CA]  1837 Sodium(!): 129 [CA]  1837 Potassium(!): 5.5 [CA]  1837 Glucose(!): 376 [CA]  1942 Creatinine(!): 2.83 [CA]  1942 BUN(!): 42 [CA]  1942 Sodium(!): 129 [CA]    Clinical Course User Index [CA] Suzy Bouchard, PA-C   MDM Rules/Calculators/A&P                         50 year old male presents the ED due to generalized abdominal pain and dizziness.  Patient seen on 3/15 for the same with reassuring CT abdomen.  Patient states his pain and dizziness improved however, returned a few days ago. He also admits to chest pain and shortness of breath.  Vitals all within normal limits.  Patient in no acute distress and nonill appearing.  Physical exam significant for diffuse abdominal tenderness.  Normal neurological exam.  Patient actively vomiting at bedside during initial evaluation.  Reproducible anterior chest wall tenderness. Troponin, CXR, and EKG ordered to rule out cardiac etiology; however suspicion is lower given reproducible tenderness on exam. Routine labs to rule out infectious etiology and electrolyte derangements. IV zofran and bentyl given for symptomatic treatment. Lower suspicion for PE given compliance with Eliquis. Dissection considered, but thought to be less likely given presentation.  Unfortunately, patient was a hard stick and labs hemolyzed causing delay in results.  CMP significant for hyponatremia at 129, hyperkalemia at 5.5, hyperglycemia at 376.  No anion gap.  Doubt DKA.  Mild worsening in renal function with creatinine of 2.83 and BUN at 42.  Initial troponin elevated at 38.  Lipase elevated at 52. CXR personally reviewed which is negative  for any acute abnormalities. CT abdomen ordered to rule out intraabdominal etiology.   CT abdomen personally reviewed which is negative for any acute abnormalities. Given numerous electrolyte abnormalities, will consult hospitalist for admission. Troponin up trending; however, it appears patient has baseline elevated troponin possibly due to CHF.   Discussed case with Dr. Hal Hope with [facility] who agrees to admit patient for further evaluation. Final Clinical Impression(s) / ED Diagnoses Final diagnoses:  Chest pain    Rx / DC Orders ED Discharge Orders    None       Suzy Bouchard, PA-C 07/07/20 2200    Lorelle Gibbs, DO 07/09/20 1001

## 2020-07-07 NOTE — ED Triage Notes (Signed)
Pt bib ems w/ c/o general abdominal pain, nausea and dizziness since 6 am today. Pt is deaf. Pt seen recently for similar issue. V/s 104/58 hr 82 rr 18 98% RA CBG 471

## 2020-07-07 NOTE — ED Notes (Signed)
Attempted x 2 for IV site unable, ED provider aware

## 2020-07-08 DIAGNOSIS — I10 Essential (primary) hypertension: Secondary | ICD-10-CM

## 2020-07-08 DIAGNOSIS — I2694 Multiple subsegmental pulmonary emboli without acute cor pulmonale: Secondary | ICD-10-CM

## 2020-07-08 DIAGNOSIS — N1832 Chronic kidney disease, stage 3b: Secondary | ICD-10-CM

## 2020-07-08 DIAGNOSIS — E118 Type 2 diabetes mellitus with unspecified complications: Secondary | ICD-10-CM

## 2020-07-08 DIAGNOSIS — Z794 Long term (current) use of insulin: Secondary | ICD-10-CM

## 2020-07-08 DIAGNOSIS — I509 Heart failure, unspecified: Secondary | ICD-10-CM

## 2020-07-08 DIAGNOSIS — N183 Chronic kidney disease, stage 3 unspecified: Secondary | ICD-10-CM

## 2020-07-08 LAB — COMPREHENSIVE METABOLIC PANEL
ALT: 19 U/L (ref 0–44)
AST: 31 U/L (ref 15–41)
Albumin: 3.6 g/dL (ref 3.5–5.0)
Alkaline Phosphatase: 42 U/L (ref 38–126)
Anion gap: 10 (ref 5–15)
BUN: 45 mg/dL — ABNORMAL HIGH (ref 6–20)
CO2: 25 mmol/L (ref 22–32)
Calcium: 9.5 mg/dL (ref 8.9–10.3)
Chloride: 99 mmol/L (ref 98–111)
Creatinine, Ser: 2.64 mg/dL — ABNORMAL HIGH (ref 0.61–1.24)
GFR, Estimated: 28 mL/min — ABNORMAL LOW (ref >60)
Glucose, Bld: 226 mg/dL — ABNORMAL HIGH (ref 70–99)
Potassium: 4.7 mmol/L (ref 3.5–5.1)
Sodium: 134 mmol/L — ABNORMAL LOW (ref 135–145)
Total Bilirubin: 0.9 mg/dL (ref 0.3–1.2)
Total Protein: 7.3 g/dL (ref 6.5–8.1)

## 2020-07-08 LAB — GLUCOSE, CAPILLARY
Glucose-Capillary: 205 mg/dL — ABNORMAL HIGH (ref 70–99)
Glucose-Capillary: 252 mg/dL — ABNORMAL HIGH (ref 70–99)
Glucose-Capillary: 257 mg/dL — ABNORMAL HIGH (ref 70–99)
Glucose-Capillary: 288 mg/dL — ABNORMAL HIGH (ref 70–99)
Glucose-Capillary: 290 mg/dL — ABNORMAL HIGH (ref 70–99)

## 2020-07-08 LAB — CBC
HCT: 39.7 % (ref 39.0–52.0)
Hemoglobin: 13 g/dL (ref 13.0–17.0)
MCH: 28.3 pg (ref 26.0–34.0)
MCHC: 32.7 g/dL (ref 30.0–36.0)
MCV: 86.5 fL (ref 80.0–100.0)
Platelets: 222 10*3/uL (ref 150–400)
RBC: 4.59 MIL/uL (ref 4.22–5.81)
RDW: 13.6 % (ref 11.5–15.5)
WBC: 4.1 10*3/uL (ref 4.0–10.5)
nRBC: 0 % (ref 0.0–0.2)

## 2020-07-08 LAB — HEMOGLOBIN A1C
Hgb A1c MFr Bld: 8.8 % — ABNORMAL HIGH (ref 4.8–5.6)
Mean Plasma Glucose: 205.86 mg/dL

## 2020-07-08 LAB — MAGNESIUM: Magnesium: 2.5 mg/dL — ABNORMAL HIGH (ref 1.7–2.4)

## 2020-07-08 LAB — TSH: TSH: 8.683 u[IU]/mL — ABNORMAL HIGH (ref 0.350–4.500)

## 2020-07-08 MED ORDER — FUROSEMIDE 40 MG PO TABS
60.0000 mg | ORAL_TABLET | Freq: Every day | ORAL | Status: DC
  Administered 2020-07-09: 60 mg via ORAL
  Filled 2020-07-08: qty 1

## 2020-07-08 MED ORDER — POLYETHYLENE GLYCOL 3350 17 G PO PACK
17.0000 g | PACK | Freq: Every day | ORAL | Status: DC
  Administered 2020-07-08 – 2020-07-09 (×2): 17 g via ORAL
  Filled 2020-07-08 (×2): qty 1

## 2020-07-08 NOTE — Plan of Care (Signed)
  Problem: Clinical Measurements: Goal: Respiratory complications will improve Outcome: Progressing   Problem: Coping: Goal: Level of anxiety will decrease Outcome: Progressing   

## 2020-07-08 NOTE — Progress Notes (Signed)
PROGRESS NOTE    Troy Rush  [id number] DOB: 1971/03/08 DOA: 07/07/2020 PCP: Troy Glatter, FNP    Brief Narrative:  Troy Rush was admitted to the hospital with the working diagnosis of decompensated diastolic heart failure.   50 yo male with past medical history of heart failure, atrial fibrillation, pulmonary embolism, T2DM, CKD stage 3 and pancreatitis, who presented with worsening dyspnea and orthopnea. On his initial physical examination his blood pressure was 124/84, HR 74, RR 19 and oxygen saturation 99%, lung with no wheezing, heart S1-S2 present, abdomen soft and no lower extremity edema.   Sodium 129, potassium 5.5, chloride 95, bicarb 23, glucose 376, BUN 42, creatinine 2.83, BNP 306, white count 4.7, hemoglobin 12.4, hematocrit 40.0, platelets 250. SARS COVID-19 negative. Urinalysis specific gravity 1.020, higher protein,> 500 glucose, 0-5 white cells, 0-5 red cells.  Abdominal CT no acute changes. Chest film personally reviewed with cardiomegaly and bilateral hilar congestion.   EKG 80 bpm, normal axis, QTc 500, sinus rhythm, poor R wave progression, no ST segment T wave changes, positive LVH.  Assessment & Plan:   Principal Problem:   Acute CHF (congestive heart failure) (HCC) Active Problems:   HTN (hypertension)   Deaf   NICM (nonischemic cardiomyopathy) ([place])   Abdominal pain   Type 2 diabetes mellitus with complication, with long-term current use of insulin (H[facility])   Pulmonary embolism ([facility])   CKD (chronic kidney disease) stage 3, GFR 30-59 ml/min ([place])   1. Acute on chronic diastolic heart failure decompensation. Patient with improved dyspnea, but not yet back to baseline.  Urine out put documented is 500 cc, will transition to oral furosemide in am 60 mg daily.  Continue beta blockade with carvedilol, continue afterload reduction with hydralazine and isosorbide.   2. AKI on CKD stage 3a, hyponatremia and hyperkalemia. Renal function this am with  serum cr at 2,64 with K at 4,7 and serum bicarbonate at 25. Continue close follow up on renal function and electrolytes, transition to oral furosemide in am.   Avoid hypotension and nephrotoxic medications.   3. HTN. Continue blood pressure control with metoprolol and hydralazine.   4. T2DM uncontrolled hypeglycemia/ dyslipidemia. Glucose this am 226, will continue with insulin sliding scale for glucose cover and monitoring.  Basal insulin with 24 units of glargine.  Continue with statin therapy and fenofibrate.   5. Obesity class 1. BMI 33.28 will need outpatient follow up.   6. Pulmonary embolism. Continue anticoagulation with apixaban.   7. Paroxysmal atrial fibrillation. Continue rhythm control with amiodarone, anticoagulation with apixaban.   Patient continue to be at high risk for worsening heart failure   Status is: Inpatient  Remains inpatient appropriate because:IV treatments appropriate due to intensity of illness or inability to take PO   Dispo: The patient is from: Home              Anticipated d/c is to: Home              Patient currently is not medically stable to d/c.   Difficult to place patient No   DVT prophylaxis: Enoxaparin   Code Status:   full  Family Communication:  No family at the bedside      Subjective: Patient is feeling better, no yet back to baseline, no nausea or vomiting, no abdominal pain or chest pain.   Objective: Vitals:   07/08/20 0003 07/08/20 0300 07/08/20 0414 07/08/20 0721  BP: (!) 176/93  132/86 135/75  Pulse: 77  81 81  Resp: '18  16 20  '$ Temp: 98.5 F (36.9 C)  98.4 F (36.9 C) 98.2 F (36.8 C)  TempSrc: Oral  Oral Oral  SpO2: 100% 100% 98% 99%  Weight: 105.2 kg     Height: '5\' 10"'$  (1.778 m)       Intake/Output Summary (Last 24 hours) at 07/08/2020 1100 Last data filed at 07/08/2020 0900 Gross per 24 hour  Intake 740 ml  Output --  Net 740 ml   Filed Weights   07/07/20 1225 07/08/20 0003  Weight: 107.5 kg 105.2  kg    Examination:   General: Not in pain or dyspnea, deconditioned  Neurology: Awake and alert, non focal  E ENT: no pallor, no icterus, oral mucosa moist Cardiovascular: No JVD. S1-S2 present, rhythmic, no gallops, rubs, or murmurs. + bilateral lower extremity edema. Pulmonary: positive breath sounds bilaterally, adequate air movement, no wheezing, rhonchi or rales. Gastrointestinal. Abdomen soft and non tender Skin. No rashes Musculoskeletal: no joint deformities     Data Reviewed: I have personally reviewed following labs and imaging studies  CBC: Recent Labs  Lab 07/07/20 1434 07/08/20 0644  WBC 4.7 4.1  NEUTROABS 3.0  --   HGB 12.4* 13.0  HCT 40.0 39.7  MCV 88.9 86.5  PLT 250 [id number]   Basic Metabolic Panel: Recent Labs  Lab 07/07/20 1534 07/07/20 2000 07/08/20 0644  NA 129* 130* 134*  K 5.5* 5.1 4.7  CL 95* 94* 99  CO2 '23 28 25  '$ GLUCOSE 376* 400* 226*  BUN 42* 42* 45*  CREATININE 2.83* 2.88* 2.64*  CALCIUM 9.4 9.4 9.5  MG  --   --  2.5*   GFR: Estimated Creatinine Clearance: 40.2 mL/min (A) (by C-G formula based on SCr of 2.64 mg/dL (H)). Liver Function Tests: Recent Labs  Lab 07/07/20 1534 07/07/20 2000 07/08/20 0644  AST '25 21 31  '$ ALT '22 17 19  '$ ALKPHOS 40 42 42  BILITOT 1.1 1.2 0.9  PROT 7.6 7.8 7.3  ALBUMIN 3.8 3.8 3.6   Recent Labs  Lab 07/07/20 1534 07/07/20 2000  LIPASE 52* 58*   No results for input(s): AMMONIA in the last 168 hours. Coagulation Profile: No results for input(s): INR, PROTIME in the last 168 hours. Cardiac Enzymes: No results for input(s): CKTOTAL, CKMB, CKMBINDEX, TROPONINI in the last 168 hours. BNP (last 3 results) No results for input(s): PROBNP in the last 8760 hours. HbA1C: Recent Labs    07/08/20 0644  HGBA1C 8.8*   CBG: Recent Labs  Lab 07/08/20 0013 07/08/20 0627  GLUCAP 288* 205*   Lipid Profile: No results for input(s): CHOL, HDL, LDLCALC, TRIG, CHOLHDL, LDLDIRECT in the last 72  hours. Thyroid Function Tests: Recent Labs    07/08/20 0644  TSH 8.683*   Anemia Panel: No results for input(s): VITAMINB12, FOLATE, FERRITIN, TIBC, IRON, RETICCTPCT in the last 72 hours.    Radiology Studies: I have reviewed all of the imaging during this hospital visit personally     Scheduled Meds: . amiodarone  100 mg Oral Daily  . apixaban  5 mg Oral BID  . atorvastatin  80 mg Oral QHS  . carvedilol  25 mg Oral BID WC  . dicyclomine  20 mg Oral BID  . fenofibrate  54 mg Oral Daily  . furosemide  40 mg Intravenous BID  . hydrALAZINE  100 mg Oral Q8H  . icosapent Ethyl  2 g Oral BID  . insulin aspart  0-9 Units Subcutaneous TID  WC  . insulin glargine  24 Units Subcutaneous QHS  . isosorbide mononitrate  60 mg Oral Daily  . latanoprost  1 drop Both Eyes QHS  . ondansetron (ZOFRAN) IV  4 mg Intravenous Once  . pantoprazole  40 mg Oral BID  . polyethylene glycol  17 g Oral Daily   Continuous Infusions:   LOS: 1 day        Natlie Asfour Gerome Apley, MD

## 2020-07-08 NOTE — Progress Notes (Signed)
Physical Therapy Evaluation Patient Details Name: Troy Rush MRN: [id number] DOB: 05-Jan-1971 Today's Date: 07/08/2020   History of Present Illness  50 yo male with onset of chest pain and known low EF of 45% was admitted, found significant elevation of BS, hypotension and acute CHF.  PMHx:  NICM, pancreatitis, apnea, atherosclerosis, umbilical hernia, DM, AICD, CHF, bronchitis, DVT, HA, HTN  Clinical Impression  Pt was seen for mobility on side of bed with pt up in recliner, able to communicate with ASL translation on ipad.  Pt is demonstrating a very low standing BP, but is able touse LE mm's without an issue otherwise.  Follow along as pt is unsafe to stand alone, unable to fully decide how to handle his light headed feelings.  Pt is offering to walk but communicated to him that PT will wait.  Continue along with LE ROM and standing balance to work on issues impeding balance skills.    Follow Up Recommendations Home health PT;Supervision for mobility/OOB    Equipment Recommendations  None recommended by PT    Recommendations for Other Services       Precautions / Restrictions Precautions Precautions: Fall;Other (comment) (Deaf) Precaution Comments: monitor hypotension, ASL needed Restrictions Weight Bearing Restrictions: No Other Position/Activity Restrictions: ck BP      Mobility  Bed Mobility               General bed mobility comments: up in chair when PT arrived    Transfers Overall transfer level: Needs assistance Equipment used: 1 person hand held assist Transfers: Sit to/from Stand Sit to Stand: Min assist         General transfer comment: min assist to control for light headed feeling from orthostatic BP  Ambulation/Gait             General Gait Details: deferred due to BP  Stairs            Wheelchair Mobility    Modified Rankin (Stroke Patients Only)       Balance Overall balance assessment: Needs assistance Sitting-balance  support: Feet supported Sitting balance-Leahy Scale: Good Sitting balance - Comments: good BP sitting no light headed feelings     Standing balance-Leahy Scale: Fair Standing balance comment: less than fair once dizzy                             Pertinent Vitals/Pain Pain Assessment: No/denies pain    Home Living Family/patient expects to be discharged to:: Private residence Living Arrangements: Non-relatives/Friends Available Help at Discharge: Family;Friend(s);Available PRN/intermittently Type of Home: House Home Access: Stairs to enter Entrance Stairs-Rails: Right Entrance Stairs-Number of Steps: 2 Home Layout: One level Home Equipment: None      Prior Function Level of Independence: Independent         Comments: has been independent on all surfaces     Hand Dominance   Dominant Hand: Right    Extremity/Trunk Assessment   Upper Extremity Assessment Upper Extremity Assessment: Overall WFL for tasks assessed    Lower Extremity Assessment Lower Extremity Assessment: Overall WFL for tasks assessed    Cervical / Trunk Assessment Cervical / Trunk Assessment: Normal  Communication   Communication: Deaf  Cognition Arousal/Alertness: Awake/alert Behavior During Therapy: WFL for tasks assessed/performed Overall Cognitive Status: Within Functional Limits for tasks assessed  General Comments: pt is very proficient with written and signing communication      General Comments General comments (skin integrity, edema, etc.): pt had positive orthostatics, with sitting BP 128/53 and standing 75/51.    Exercises     Assessment/Plan    PT Assessment Patient needs continued PT services  PT Problem List Cardiopulmonary status limiting activity;Decreased range of motion;Decreased mobility;Decreased balance;Decreased knowledge of precautions       PT Treatment Interventions DME instruction;Gait training;Stair  training;Functional mobility training;Therapeutic activities;Therapeutic exercise;Balance training;Neuromuscular re-education;Patient/family education    PT Goals (Current goals can be found in the Care Plan section)  Acute Rehab PT Goals Patient Stated Goal: to get home PT Goal Formulation: With patient Time For Goal Achievement: 07/15/20 Potential to Achieve Goals: Good    Frequency Min 3X/week   Barriers to discharge Inaccessible home environment;Decreased caregiver support home alone with stairs to enter    Co-evaluation               AM-PAC PT "6 Clicks" Mobility  Outcome Measure Help needed turning from your back to your side while in a flat bed without using bedrails?: A Little Help needed moving from lying on your back to sitting on the side of a flat bed without using bedrails?: A Little Help needed moving to and from a bed to a chair (including a wheelchair)?: A Little Help needed standing up from a chair using your arms (e.g., wheelchair or bedside chair)?: A Little Help needed to walk in hospital room?: A Little Help needed climbing 3-5 steps with a railing? : Total 6 Click Score: 16    End of Session   Activity Tolerance: Patient limited by fatigue;Treatment limited secondary to medical complications (Comment) Patient left: in chair;with call bell/phone within reach;with chair alarm set Nurse Communication: Mobility status PT Visit Diagnosis: Unsteadiness on feet (R26.81);Difficulty in walking, not elsewhere classified (R26.2);Dizziness and giddiness (R42)    Time: [id number] PT Time Calculation (min) (ACUTE ONLY): 29 min   Charges:   PT Evaluation $PT Eval Moderate Complexity: 1 Mod PT Treatments $Gait Training: 8-22 mins       Ramond Dial 07/08/2020, 4:09 PM Mee Hives, PT MS Acute Rehab Dept. Number: [place] and [place]

## 2020-07-08 NOTE — TOC Progression Note (Signed)
Transition of Care [facility]) - Progression Note    Patient Details  Name: Troy Rush MRN: [id number] Date of Birth: December 19, 1970  Transition [facility]) CM/SW Contact  Zenon Mayo, RN Phone Number: 07/08/2020, 10:22 AM  Clinical Narrative:    NCM went to speak with patient at bedside, he is deaf, so wrote out on paper to offer choice for [facility], he states he would like [facility] for CHF management and does not have preference of agency.  NCM made referral to [place] with [facility]. Awaiting call back.        Expected Discharge Plan and Services                                                 Social Determinants of Health (SDOH) Interventions    Readmission Risk Interventions Readmission Risk Prevention Plan 02/06/2020  PCP or Specialist Appt within 3-5 Days Complete  HRI or [place] Complete  Social Work Consult for [place] Planning/Counseling Complete  Palliative Care Screening Not Applicable  Medication Review Press photographer) Complete  Some recent data might be hidden

## 2020-07-08 NOTE — Progress Notes (Signed)
Paged MD about patient BP 81/55 and recheck 87/55. Per MD, okay to hold Hydralazine for today.

## 2020-07-09 DIAGNOSIS — R1013 Epigastric pain: Secondary | ICD-10-CM

## 2020-07-09 DIAGNOSIS — H9193 Unspecified hearing loss, bilateral: Secondary | ICD-10-CM

## 2020-07-09 DIAGNOSIS — I428 Other cardiomyopathies: Secondary | ICD-10-CM

## 2020-07-09 LAB — BASIC METABOLIC PANEL
Anion gap: 8 (ref 5–15)
BUN: 48 mg/dL — ABNORMAL HIGH (ref 6–20)
CO2: 25 mmol/L (ref 22–32)
Calcium: 9.2 mg/dL (ref 8.9–10.3)
Chloride: 100 mmol/L (ref 98–111)
Creatinine, Ser: 2.94 mg/dL — ABNORMAL HIGH (ref 0.61–1.24)
GFR, Estimated: 25 mL/min — ABNORMAL LOW (ref >60)
Glucose, Bld: 289 mg/dL — ABNORMAL HIGH (ref 70–99)
Potassium: 4.3 mmol/L (ref 3.5–5.1)
Sodium: 133 mmol/L — ABNORMAL LOW (ref 135–145)

## 2020-07-09 LAB — GLUCOSE, CAPILLARY
Glucose-Capillary: 215 mg/dL — ABNORMAL HIGH (ref 70–99)
Glucose-Capillary: 168 mg/dL — ABNORMAL HIGH (ref 70–99)
Glucose-Capillary: 242 mg/dL — ABNORMAL HIGH (ref 70–99)

## 2020-07-09 MED ORDER — FUROSEMIDE 20 MG PO TABS: 60.0000 mg | ORAL_TABLET | Freq: Every day | ORAL | 0 refills | Status: DC

## 2020-07-09 NOTE — Evaluation (Signed)
Occupational Therapy Evaluation Patient Details Name: Troy Rush MRN: [id number] DOB: Oct 05, 1970 Today's Date: 07/09/2020    History of Present Illness 50 yo male with onset of chest pain and known low EF of 45% was admitted, found significant elevation of BS, hypotension and acute CHF.  PMHx:  NICM, pancreatitis, apnea, atherosclerosis, umbilical hernia, DM, AICD, CHF, bronchitis, DVT, HA, HTN   Clinical Impression   Patient admitted for the diagnosis above.  Primary deficit is DOE, but was able to dress himself and walk the halls, including completing a set of stairs.  He has been discharged and is preparing to return home after he eats lunch.  BP issues appear resolved, and he had no dizziness or other symptoms with mobility.  No post acute OT needs identified.  He will have supervision as needed at home.      Follow Up Recommendations  No OT follow up    Equipment Recommendations  None recommended by OT    Recommendations for Other Services       Precautions / Restrictions Precautions Precautions: Fall Restrictions Weight Bearing Restrictions: No Other Position/Activity Restrictions: 117/66      Mobility Bed Mobility Overal bed mobility: Independent                  Transfers Overall transfer level: Independent                    Balance Overall balance assessment: Mild deficits observed, not formally tested                                         ADL either performed or assessed with clinical judgement   ADL Overall ADL's : Independent;At baseline                                             Vision Patient Visual Report: No change from baseline       Perception     Praxis      Pertinent Vitals/Pain Pain Assessment: No/denies pain     Hand Dominance Right   Extremity/Trunk Assessment Upper Extremity Assessment Upper Extremity Assessment: Overall WFL for tasks assessed   Lower Extremity  Assessment Lower Extremity Assessment: Overall WFL for tasks assessed   Cervical / Trunk Assessment Cervical / Trunk Assessment: Normal   Communication Communication Communication: Deaf   Cognition Arousal/Alertness: Awake/alert Behavior During Therapy: WFL for tasks assessed/performed Overall Cognitive Status: Within Functional Limits for tasks assessed                                                Shoulder Instructions      Home Living Family/patient expects to be discharged to:: Private residence Living Arrangements: Non-relatives/Friends Available Help at Discharge: Family;Friend(s);Available PRN/intermittently Type of Home: House Home Access: Stairs to enter [employer] of Steps: 2 Entrance Stairs-Rails: Right Home Layout: One level         Bathroom Toilet: Standard     Home Equipment: None          Prior Functioning/Environment Level of Independence: Independent  OT Problem List: Decreased activity tolerance      OT Treatment/Interventions:      OT Goals(Current goals can be found in the care plan section) Acute Rehab OT Goals Patient Stated Goal: to get home OT Goal Formulation: With patient Time For Goal Achievement: 07/09/20 Potential to Achieve Goals: Good  OT Frequency:     Barriers to D/C:  none noted          Co-evaluation              AM-PAC OT "6 Clicks" Daily Activity     Outcome Measure Help from another person eating meals?: None Help from another person taking care of personal grooming?: None Help from another person toileting, which includes using toliet, bedpan, or urinal?: None Help from another person bathing (including washing, rinsing, drying)?: None Help from another person to put on and taking off regular upper body clothing?: None Help from another person to put on and taking off regular lower body clothing?: None 6 Click Score: 24   End of Session Nurse  Communication: Mobility status  Activity Tolerance: Patient tolerated treatment well Patient left: in chair;with call bell/phone within reach  OT Visit Diagnosis: Pain Pain - part of body:  (chest)                Time: [id number] OT Time Calculation (min): 14 min Charges:  OT General Charges $OT Visit: 1 Visit OT Evaluation $OT Eval Moderate Complexity: 1 Mod  07/09/2020  Rich, OTR/L  Acute Rehabilitation Services  Office:  [phone]   Metta Clines 07/09/2020, 12:17 PM

## 2020-07-09 NOTE — TOC Initial Note (Signed)
Transition of Care [facility]) - Initial/Assessment Note    Patient Details  Name: Troy Rush MRN: [id number] Date of Birth: 1971/01/16  [facility]) CM/SW Contact:    Zenon Mayo, RN Phone Number: 07/09/2020, 12:03 PM  Clinical Narrative:                 Patient is deaf but can read, NCM communicated with him by writing on pad. He lives with a roommate who is hardly ever there.  He gets his meds from [employer], they deliver them to him.  They will deliver the lasix medication to him today.  He will need transportation home. NCM will asist him with [facility] transport.  NCM offered choice to him for [facility] services he did not have a preference.  NCM made referral to [place] with [facility] for HHRN,HHPT,HHOT.  He states to let them know to communicate with him by texting his phone to let him know they are there at the house.  This information was given to [place].    Expected Discharge Plan: [place] Barriers to Discharge: No Barriers Identified   Patient Goals and CMS Choice Patient states their goals for this hospitalization and ongoing recovery are:: return home CMS Medicare.gov Compare Post Acute Care list provided to:: Patient Choice offered to / list presented to : Patient  Expected Discharge Plan and Services Expected Discharge Plan: [place]   Discharge Planning Services: CM Consult Post Acute Care Choice: [place] arrangements for the past 2 months: Single Family Home Expected Discharge Date: 07/09/20                 DME Agency: NA       HH Arranged: RN,PT [place] Agency:  Tax inspector) Date [place]: 07/08/20 Time [place]: 1202 Representative spoke with at [place]: [place]  Prior Living Arrangements/Services Living arrangements for the past 2 months: [place] with:: Roommate Patient language and need for interpreter reviewed:: Yes Do you feel safe going back to  the place where you live?: Yes      Need for Family Participation in Patient Care: No (Comment) Care giver support system in place?: No (comment)   Criminal Activity/Legal Involvement Pertinent to Current Situation/Hospitalization: No - Comment as needed  Activities of Daily Living      Permission Sought/Granted                  Emotional Assessment Appearance:: Appears stated age Attitude/Demeanor/Rapport: Engaged Affect (typically observed): Appropriate Orientation: : Oriented to  Time,Oriented to Place,Oriented to Self,Oriented to Situation Alcohol / Substance Use: Not Applicable Psych Involvement: No (comment)  Admission diagnosis:  Acute CHF (congestive heart failure) ([place]) [I50.9] Chest pain [R07.9] Patient Active Problem List   Diagnosis Date Noted  . CKD (chronic kidney disease) stage 3, GFR 30-59 ml/min (HCC) 07/08/2020  . Acute CHF (congestive heart failure) ([place]) 07/07/2020  . CKD (chronic kidney disease), stage IV ([place]) 07/07/2020  . ICD (implantable cardioverter-defibrillator) in place 05/14/2020  . Pulmonary embolism ([place]) 02/05/2020  . Acute kidney injury superimposed on CKD ([place])   . Chronic combined systolic and diastolic heart failure ([place])   . Atrial fibrillation with RVR ([place]) 02/01/2020  . NSVT (nonsustained ventricular tachycardia) ([place]) 02/01/2020  . Acute GI bleeding 02/01/2020  . DKA (diabetic ketoacidosis) ([place]) 02/01/2020  . Bilateral deafness 08/15/2019  . Type 2 diabetes mellitus with complication, with long-term current use of insulin (  [place]) 08/15/2019  . Hemoglobin A1C between 7% and 9% indicating borderline diabetic control 08/15/2019  . Insomnia 08/15/2019  . Abdominal pain 11/14/2018  . Ischemic cardiomyopathy 10/14/2016  . NICM (nonischemic cardiomyopathy) ([place]) 09/15/2016  . Chronic systolic CHF (congestive heart failure) ([place]) 05/30/2016  . HTN (hypertension) 05/30/2016  . Deaf 05/30/2016  . Hyperkalemia 05/30/2016  . Diabetes  ([place]) 03/21/2016  . Major depressive disorder, recurrent episode ([place]) 03/21/2016   PCP:  [name], FNP Pharmacy:   [place], [place] - [address] [place] [address] Phone: [phone] Fax: [phone]     Social Determinants of Health (SDOH) Interventions    Readmission Risk Interventions Readmission Risk Prevention Plan 07/09/2020 02/06/2020  Transportation Screening Complete -  PCP or Specialist Appt within 3-5 Days Complete Complete  HRI or [place] Complete Complete  Social Work Consult for [place] Planning/Counseling Complete Complete  Palliative Care Screening Complete Not Applicable  Medication Review Press photographer) Complete Complete  Some recent data might be hidden

## 2020-07-09 NOTE — Progress Notes (Signed)
Pt did not used the urinal to void, educated on the importance on using the urinal to measure urine output.

## 2020-07-09 NOTE — Discharge Summary (Signed)
Physician Discharge Summary  Troy Rush [id number] DOB: April 25, 1970 DOA: 07/07/2020  PCP: [name], FNP  Admit date: 07/07/2020 Discharge date: 07/09/2020  Admitted From: Home Disposition:   Home   Recommendations for Outpatient Follow-up and new medication changes:  1. Follow up with Kathe Becton NP in 7 days.  2. Follow up renal function in 7 days. 3. Furosemide increased to 60 mg daily.   Home Health: yes  Equipment/Devices: no   Discharge Condition: stable  CODE STATUS: full  Diet recommendation: heart healthy   Brief/Interim Summary: [name] was admitted to the hospital with the working diagnosis of acute decompensated chronic diastolic heart failure.   49 yo male with past medical history of heart failure, atrial fibrillation, pulmonary embolism, T2DM, CKD stage 3 and pancreatitis, who presented with worsening dyspnea and orthopnea. He also reported abdominal pain, associated with nausea. Prior ED visit on 03/15 with negative work up for abdominal pain with Ct of the abdomen.  On his initial physical examination his blood pressure was 124/84, HR 74, RR 19 and oxygen saturation 99%, lung with no wheezing, heart S1-S2 present, abdomen soft and no lower extremity edema.   Sodium 129, potassium 5.5, chloride 95, bicarb 23, glucose 376, BUN 42, creatinine 2.83, BNP 306, white count 4.7, hemoglobin 12.4, hematocrit 40.0, platelets 250. SARS COVID-19 negative. Urinalysis specific gravity 1.020, higher protein,> 500 glucose, 0-5 white cells, 0-5 red cells.  Abdominal CT no acute changes. Chest film personally reviewed with cardiomegaly and bilateral hilar congestion.   EKG 80 bpm, normal axis, QTc 500, sinus rhythm, poor R wave progression, no ST segment T wave changes, positive LVH.  Patient received IV furosemide with improvement in his volume status.   1.  Acute on chronic diastolic heart failure decompensation.  Patient was admitted to the telemetry ward,  he received intravenous furosemide with good toleration, negative fluid balance was achieved with significant improvement of his symptoms.  Continue heart failure management with carvedilol, afterload reduction with hydralazine and isosorbide. Furosemide will be increased to 60 mg daily.  2.  Acute kidney injury chronic kidney disease stage IIIa, hyponatremia, hyperkalemia.  Patient received intravenous furosemide with good toleration. At the time of his discharge sodium 133, potassium 4.3, chloride 100, bicarb 25, glucose 289, BUN 48, creatinine 2.94. Follow-up kidney function as an outpatient. Furosemide will be increased to 60 mg daily.  3.  Hypertension.  Continue blood pressure control with metoprolol, hydralazine and isosorbide.  4.  Type II diabetes mellitus, uncontrolled hyperglycemia.  Dyslipidemia.  Patient received insulin sliding scale for glucose coverage and monitoring. Basal insulin 24 units of glargine with good toleration. Continue statin and fenofibrate. At discharge resume dapagliflozin.   5.  Obesity class I.  Calculated BMI 33.2.  Outpatient follow-up.  6.  Pulmonary embolism.  Continue anticoagulation with apixaban.  7.  Paroxysmal atrial fibrillation.  He remains rate and rhythm controlled with amiodarone.  Anticoagulation with apixaban.  8.  History of prancreatitis.  No clinical signs of recurrence, he had no further abdominal pain during his hospitalization, no nausea or vomiting.  Discharge Diagnoses:  Principal Problem:   Acute CHF (congestive heart failure) (HCC) Active Problems:   HTN (hypertension)   Deaf   NICM (nonischemic cardiomyopathy) ([place])   Abdominal pain   Type 2 diabetes mellitus with complication, with long-term current use of insulin (HCC)   Pulmonary embolism (HCC)   CKD (chronic kidney disease) stage 3, GFR 30-59 ml/min [facility])    Discharge Instructions  Allergies as of 07/09/2020      Reactions   Lisinopril Cough       Medication List    TAKE these medications   amiodarone 200 MG tablet Commonly known as: PACERONE Take 0.5 tablets (100 mg total) by mouth daily.   atorvastatin 80 MG tablet Commonly known as: LIPITOR Take 1 tablet (80 mg total) by mouth at bedtime.   carvedilol 25 MG tablet Commonly known as: COREG Take 1 tablet (25 mg total) by mouth 2 (two) times daily with a meal.   dapagliflozin propanediol 10 MG Tabs tablet Commonly known as: Farxiga Take 1 tablet (10 mg total) by mouth daily before breakfast.   dicyclomine 20 MG tablet Commonly known as: BENTYL Take 1 tablet (20 mg total) by mouth 2 (two) times daily.   Eliquis 5 MG Tabs tablet Generic drug: apixaban TAKE 1 TABLET (5 MG TOTAL) BY MOUTH 2 (TWO) TIMES DAILY.   fenofibrate 54 MG tablet TAKE 1 TABLET (54 MG TOTAL) BY MOUTH DAILY.   furosemide 20 MG tablet Commonly known as: LASIX Take 3 tablets (60 mg total) by mouth daily. Start taking on: July 10, 2020 What changed:   medication strength  how much to take  when to take this   hydrALAZINE 100 MG tablet Commonly known as: APRESOLINE Take 1 tablet (100 mg total) by mouth 3 (three) times daily.   insulin glargine 100 UNIT/ML injection Commonly known as: LANTUS Inject 0.24 mLs (24 Units total) into the skin daily.   isosorbide mononitrate 60 MG 24 hr tablet Commonly known as: IMDUR Take 1 tablet (60 mg total) by mouth daily.   Lancets Misc 1 each by Does not apply route 3 (three) times daily as needed.   latanoprost 0.005 % ophthalmic solution Commonly known as: XALATAN Place 1 drop into both eyes at bedtime.   omeprazole 20 MG capsule Commonly known as: PRILOSEC Take 1 capsule (20 mg total) by mouth daily.   pantoprazole 40 MG tablet Commonly known as: PROTONIX Take 1 tablet (40 mg total) by mouth 2 (two) times daily.   Vascepa 1 g capsule Generic drug: icosapent Ethyl TAKE TWO CAPSULES BY MOUTH TWICE A DAY       Allergies  Allergen  Reactions  . Lisinopril Cough      Procedures/Studies: CT ABDOMEN PELVIS WO CONTRAST  Result Date: 07/07/2020 CLINICAL DATA:  Generalized abdominal pain, nausea, dizziness EXAM: CT ABDOMEN AND PELVIS WITHOUT CONTRAST TECHNIQUE: Multidetector CT imaging of the abdomen and pelvis was performed following the standard protocol without IV contrast. COMPARISON:  06/23/2020 FINDINGS: Lower chest: No acute pleural or parenchymal lung disease. Unenhanced CT was performed per clinician order. Lack of IV contrast limits sensitivity and specificity, especially for evaluation of abdominal/pelvic solid viscera. Hepatobiliary: No focal liver abnormality is seen. No gallstones, gallbladder wall thickening, or biliary dilatation. Pancreas: Unremarkable. No pancreatic ductal dilatation or surrounding inflammatory changes. Spleen: Normal in size without focal abnormality. Adrenals/Urinary Tract: No urinary tract calculi or obstructive uropathy. The adrenals and bladder are unremarkable. Stomach/Bowel: No bowel obstruction or ileus. Normal appendix right lower quadrant. No bowel wall thickening or inflammatory change. Vascular/Lymphatic: Aortic atherosclerosis. No enlarged abdominal or pelvic lymph nodes. Reproductive: Prostate is unremarkable. Other: No free fluid or free gas. Stable small umbilical hernia containing a portion of small bowel. No bowel incarceration or obstruction. Musculoskeletal: No acute or destructive bony lesions. Reconstructed images demonstrate no additional findings. IMPRESSION: 1. No acute intra-abdominal or intrapelvic process. 2. Stable small umbilical hernia.  3.  Aortic Atherosclerosis (ICD10-I70.0). Electronically Signed   By: Randa Ngo M.D.   On: 07/07/2020 20:53   CT ABDOMEN PELVIS WO CONTRAST  Result Date: 06/23/2020 CLINICAL DATA:  Abdominal pain. EXAM: CT ABDOMEN AND PELVIS WITHOUT CONTRAST TECHNIQUE: Multidetector CT imaging of the abdomen and pelvis was performed following the  standard protocol without IV contrast. COMPARISON:  February 05, 2020 FINDINGS: Lower chest: There is mild cardiomegaly. Hepatobiliary: No focal liver abnormality is seen. No gallstones, gallbladder wall thickening, or biliary dilatation. Pancreas: Unremarkable. No pancreatic ductal dilatation or surrounding inflammatory changes. Spleen: Normal in size without focal abnormality. Adrenals/Urinary Tract: Adrenal glands are unremarkable. Kidneys are normal, without renal calculi, focal lesion, or hydronephrosis. Urinary bladder is unremarkable. Stomach/Bowel: Stomach is within normal limits. Appendix appears normal. A moderate to marked amount of stool is seen throughout the large bowel. No evidence of bowel wall thickening, distention, or inflammatory changes. Vascular/Lymphatic: No significant vascular findings are present. No enlarged abdominal or pelvic lymph nodes. Reproductive: The prostate gland is mildly enlarged. Other: No abdominal wall hernia or abnormality. No abdominopelvic ascites. Musculoskeletal: No acute or significant osseous findings. IMPRESSION: 1. No evidence of acute or active process within the abdomen or pelvis. Electronically Signed   By: Virgina Norfolk M.D.   On: 06/23/2020 19:48   DG Chest 1 View  Result Date: 07/07/2020 CLINICAL DATA:  Chest pain. EXAM: CHEST  1 VIEW COMPARISON:  June 23, 2020. FINDINGS: Stable cardiomediastinal silhouette. Left-sided pacemaker is unchanged in position. No pneumothorax or pleural effusion is noted. No acute pulmonary disease is noted. Bony thorax is unremarkable. IMPRESSION: No active disease. Electronically Signed   By: Marijo Conception M.D.   On: 07/07/2020 14:24   DG Chest 2 View  Result Date: 06/23/2020 CLINICAL DATA:  Chest pain.  Shortness of breath. EXAM: CHEST - 2 VIEW COMPARISON:  08/18/2019 FINDINGS: Low volume film. Cardiopericardial silhouette is at upper limits of normal for size. There is pulmonary vascular congestion without overt  pulmonary edema. Nodular density in the left parahilar lung may be a vessel on end but appears larger than expected for this position. No pulmonary nodule seen at this location on CT angio chest 02/05/2020. Single lead pacer/AICD noted. IMPRESSION: 1. Low volume film with pulmonary vascular congestion. 2. Left parahilar nodular density, potentially a vessel on end. CT chest without contrast recommended to further evaluate. Electronically Signed   By: Misty Stanley M.D.   On: 06/23/2020 14:38   CUP PACEART REMOTE DEVICE CHECK  Result Date: 06/24/2020 Scheduled remote reviewed. Normal device function.  Next remote 91 days. HB     Subjective: Patient is feeling better, no nausea or vomiting, no chest pain, no abdominal pain, no dyspnea.   Discharge Exam: Vitals:   07/09/20 0545 07/09/20 0833  BP: (!) 158/87 130/66  Pulse: 71 75  Resp: 18 19  Temp: 97.7 F (36.5 C) 98.3 F (36.8 C)  SpO2: 98% 100%   Vitals:   07/09/20 0020 07/09/20 0204 07/09/20 0545 07/09/20 0833  BP: 133/72  (!) 158/87 130/66  Pulse: 72  71 75  Resp:   18 19  Temp: 97.8 F (36.6 C)  97.7 F (36.5 C) 98.3 F (36.8 C)  TempSrc: Oral  Oral Oral  SpO2: 100%  98% 100%  Weight:  105.5 kg    Height:        General: Not in pain or dyspnea.  Neurology: Awake and alert, non focal  E ENT: no pallor, no icterus,  oral mucosa moist Cardiovascular: No JVD. S1-S2 present, rhythmic, no gallops, rubs, or murmurs. No significant lower extremity edema. Pulmonary: positive breath sounds bilaterally, adequate air movement, no wheezing, rhonchi or rales. Gastrointestinal. Abdomen soft and non tender Skin. No rashes Musculoskeletal: no joint deformities   The results of significant diagnostics from this hospitalization (including imaging, microbiology, ancillary and laboratory) are listed below for reference.     Microbiology: Recent Results (from the past 240 hour(s))  Resp Panel by RT-PCR (Flu A&B, Covid) Nasopharyngeal  Swab     Status: None   Collection Time: 07/07/20  1:36 PM   Specimen: Nasopharyngeal Swab; Nasopharyngeal(NP) swabs in vial transport medium  Result Value Ref Range Status   SARS Coronavirus 2 by RT PCR NEGATIVE NEGATIVE Final    Comment: (NOTE) SARS-CoV-2 target nucleic acids are NOT DETECTED.  The SARS-CoV-2 RNA is generally detectable in upper respiratory specimens during the acute phase of infection. The lowest concentration of SARS-CoV-2 viral copies this assay can detect is 138 copies/mL. A negative result does not preclude SARS-Cov-2 infection and should not be used as the sole basis for treatment or other patient management decisions. A negative result may occur with  improper specimen collection/handling, submission of specimen other than nasopharyngeal swab, presence of viral mutation(s) within the areas targeted by this assay, and inadequate number of viral copies(<138 copies/mL). A negative result must be combined with clinical observations, patient history, and epidemiological information. The expected result is Negative.  Fact Sheet for Patients:  EntrepreneurPulse.com.au  Fact Sheet for Healthcare Providers:  IncredibleEmployment.be  This test is no t yet approved or cleared by the [place] FDA and  has been authorized for detection and/or diagnosis of SARS-CoV-2 by FDA under an Emergency Use Authorization (EUA). This EUA will remain  in effect (meaning this test can be used) for the duration of the COVID-19 declaration under Section 564(b)(1) of the Act, 21 U.S.C.section 360bbb-3(b)(1), unless the authorization is terminated  or revoked sooner.       Influenza A by PCR NEGATIVE NEGATIVE Final   Influenza B by PCR NEGATIVE NEGATIVE Final    Comment: (NOTE) The Xpert Xpress SARS-CoV-2/FLU/RSV plus assay is intended as an aid in the diagnosis of influenza from Nasopharyngeal swab specimens and should not be used as a sole  basis for treatment. Nasal washings and aspirates are unacceptable for Xpert Xpress SARS-CoV-2/FLU/RSV testing.  Fact Sheet for Patients: EntrepreneurPulse.com.au  Fact Sheet for Healthcare Providers: IncredibleEmployment.be  This test is not yet approved or cleared by the [place] FDA and has been authorized for detection and/or diagnosis of SARS-CoV-2 by FDA under an Emergency Use Authorization (EUA). This EUA will remain in effect (meaning this test can be used) for the duration of the COVID-19 declaration under Section 564(b)(1) of the Act, 21 U.S.C. section 360bbb-3(b)(1), unless the authorization is terminated or revoked.  Performed at [facility], [place] [address] [place], [place] [address]      Labs: BNP (last 3 results) Recent Labs    05/15/20 1133 06/23/20 1718 07/07/20 1335  BNP 93.5 333.7* [id number]*   Basic Metabolic Panel: Recent Labs  Lab 07/07/20 1534 07/07/20 2000 07/08/20 0644 07/09/20 0336  NA 129* 130* 134* 133*  K 5.5* 5.1 4.7 4.3  CL 95* 94* 99 100  CO2 '23 28 25 25  '$ GLUCOSE 376* 400* 226* 289*  BUN 42* 42* 45* 48*  CREATININE 2.83* 2.88* 2.64* 2.94*  CALCIUM 9.4 9.4 9.5 9.2  MG  --   --  2.5*  --    Liver Function Tests: Recent Labs  Lab 07/07/20 1534 07/07/20 2000 07/08/20 0644  AST '25 21 31  '$ ALT '22 17 19  '$ ALKPHOS 40 42 42  BILITOT 1.1 1.2 0.9  PROT 7.6 7.8 7.3  ALBUMIN 3.8 3.8 3.6   Recent Labs  Lab 07/07/20 1534 07/07/20 2000  LIPASE 52* 58*   No results for input(s): AMMONIA in the last 168 hours. CBC: Recent Labs  Lab 07/07/20 1434 07/08/20 0644  WBC 4.7 4.1  NEUTROABS 3.0  --   HGB 12.4* 13.0  HCT 40.0 39.7  MCV 88.9 86.5  PLT 250 222   Cardiac Enzymes: No results for input(s): CKTOTAL, CKMB, CKMBINDEX, TROPONINI in the last 168 hours. BNP: Invalid input(s): POCBNP CBG: Recent Labs  Lab 07/08/20 1115 07/08/20 1606 07/08/20 2107 07/09/20 0547 07/09/20 0834   GLUCAP 257* 290* 252* 215* 168*   D-Dimer No results for input(s): DDIMER in the last 72 hours. Hgb A1c Recent Labs    07/08/20 0644  HGBA1C 8.8*   Lipid Profile No results for input(s): CHOL, HDL, LDLCALC, TRIG, CHOLHDL, LDLDIRECT in the last 72 hours. Thyroid function studies Recent Labs    07/08/20 0644  TSH 8.683*   Anemia work up No results for input(s): VITAMINB12, FOLATE, FERRITIN, TIBC, IRON, RETICCTPCT in the last 72 hours. Urinalysis    Component Value Date/Time   COLORURINE YELLOW 07/07/2020 1505   APPEARANCEUR HAZY (A) 07/07/2020 1505   LABSPEC 1.020 07/07/2020 1505   PHURINE 5.0 07/07/2020 1505   GLUCOSEU >=500 (A) 07/07/2020 1505   HGBUR NEGATIVE 07/07/2020 1505   BILIRUBINUR NEGATIVE 07/07/2020 1505   BILIRUBINUR Negative 08/13/2019 1103   KETONESUR 5 (A) 07/07/2020 1505   PROTEINUR 100 (A) 07/07/2020 1505   UROBILINOGEN 0.2 08/13/2019 1103   UROBILINOGEN 0.2 05/09/2017 1130   NITRITE NEGATIVE 07/07/2020 1505   LEUKOCYTESUR NEGATIVE 07/07/2020 1505   Sepsis Labs Invalid input(s): PROCALCITONIN,  WBC,  LACTICIDVEN Microbiology Recent Results (from the past 240 hour(s))  Resp Panel by RT-PCR (Flu A&B, Covid) Nasopharyngeal Swab     Status: None   Collection Time: 07/07/20  1:36 PM   Specimen: Nasopharyngeal Swab; Nasopharyngeal(NP) swabs in vial transport medium  Result Value Ref Range Status   SARS Coronavirus 2 by RT PCR NEGATIVE NEGATIVE Final    Comment: (NOTE) SARS-CoV-2 target nucleic acids are NOT DETECTED.  The SARS-CoV-2 RNA is generally detectable in upper respiratory specimens during the acute phase of infection. The lowest concentration of SARS-CoV-2 viral copies this assay can detect is 138 copies/mL. A negative result does not preclude SARS-Cov-2 infection and should not be used as the sole basis for treatment or other patient management decisions. A negative result may occur with  improper specimen collection/handling, submission  of specimen other than nasopharyngeal swab, presence of viral mutation(s) within the areas targeted by this assay, and inadequate number of viral copies(<138 copies/mL). A negative result must be combined with clinical observations, patient history, and epidemiological information. The expected result is Negative.  Fact Sheet for Patients:  EntrepreneurPulse.com.au  Fact Sheet for Healthcare Providers:  IncredibleEmployment.be  This test is no t yet approved or cleared by the [place] FDA and  has been authorized for detection and/or diagnosis of SARS-CoV-2 by FDA under an Emergency Use Authorization (EUA). This EUA will remain  in effect (meaning this test can be used) for the duration of the COVID-19 declaration under Section 564(b)(1) of the Act, 21 U.S.C.section 360bbb-3(b)(1), unless the authorization  is terminated  or revoked sooner.       Influenza A by PCR NEGATIVE NEGATIVE Final   Influenza B by PCR NEGATIVE NEGATIVE Final    Comment: (NOTE) The Xpert Xpress SARS-CoV-2/FLU/RSV plus assay is intended as an aid in the diagnosis of influenza from Nasopharyngeal swab specimens and should not be used as a sole basis for treatment. Nasal washings and aspirates are unacceptable for Xpert Xpress SARS-CoV-2/FLU/RSV testing.  Fact Sheet for Patients: EntrepreneurPulse.com.au  Fact Sheet for Healthcare Providers: IncredibleEmployment.be  This test is not yet approved or cleared by the [place] FDA and has been authorized for detection and/or diagnosis of SARS-CoV-2 by FDA under an Emergency Use Authorization (EUA). This EUA will remain in effect (meaning this test can be used) for the duration of the COVID-19 declaration under Section 564(b)(1) of the Act, 21 U.S.C. section 360bbb-3(b)(1), unless the authorization is terminated or revoked.  Performed at [facility], [place] [address]  [place], [place] [address]      Time coordinating discharge: 45 minutes  SIGNED:   Tawni Millers, MD  [facility] 07/09/2020, 9:54 AM

## 2020-07-09 NOTE — Progress Notes (Signed)
Patient has all belongings, discharge instructions reviewed with patient with help from interrupter. IV removal well tolerated. Has note from work, awaiting Surveyor, mining

## 2020-07-10 ENCOUNTER — Other Ambulatory Visit (HOSPITAL_COMMUNITY): Payer: Self-pay

## 2020-07-10 NOTE — Progress Notes (Signed)
Paramedicine Encounter    Patient ID: Troy Rush, male    DOB: 08/01/1970, 50 y.o.   MRN: [id number]   Patient Care Team: [name], FNP as PCP - General (Family Medicine) Larey Dresser, MD as PCP - Cardiology (Cardiology) Conrad Peetz, NP as Nurse Practitioner (Cardiology) Larey Dresser, MD as Consulting Physician (Cardiology)  Patient Active Problem List   Diagnosis Date Noted  . CKD (chronic kidney disease) stage 3, GFR 30-59 ml/min (HCC) 07/08/2020  . Acute CHF (congestive heart failure) ([place]) 07/07/2020  . CKD (chronic kidney disease), stage IV ([place]) 07/07/2020  . ICD (implantable cardioverter-defibrillator) in place 05/14/2020  . Pulmonary embolism ([place]) 02/05/2020  . Acute kidney injury superimposed on CKD ([place])   . Chronic combined systolic and diastolic heart failure ([place])   . Atrial fibrillation with RVR ([place]) 02/01/2020  . NSVT (nonsustained ventricular tachycardia) ([place]) 02/01/2020  . Acute GI bleeding 02/01/2020  . DKA (diabetic ketoacidosis) ([place]) 02/01/2020  . Bilateral deafness 08/15/2019  . Type 2 diabetes mellitus with complication, with long-term current use of insulin ([place]) 08/15/2019  . Hemoglobin A1C between 7% and 9% indicating borderline diabetic control 08/15/2019  . Insomnia 08/15/2019  . Abdominal pain 11/14/2018  . Ischemic cardiomyopathy 10/14/2016  . NICM (nonischemic cardiomyopathy) ([place]) 09/15/2016  . Chronic systolic CHF (congestive heart failure) ([place]) 05/30/2016  . HTN (hypertension) 05/30/2016  . Deaf 05/30/2016  . Hyperkalemia 05/30/2016  . Diabetes ([place]) 03/21/2016  . Major depressive disorder, recurrent episode ([place]) 03/21/2016    Current Outpatient Medications:  .  amiodarone (PACERONE) 200 MG tablet, Take 0.5 tablets (100 mg total) by mouth daily., Disp: 60 tablet, Rfl: 2 .  atorvastatin (LIPITOR) 80 MG tablet, Take 1 tablet (80 mg total) by mouth at bedtime., Disp: 30 tablet, Rfl: 11 .  carvedilol (COREG) 25 MG  tablet, Take 1 tablet (25 mg total) by mouth 2 (two) times daily with a meal., Disp: 60 tablet, Rfl: 6 .  dapagliflozin propanediol (FARXIGA) 10 MG TABS tablet, Take 1 tablet (10 mg total) by mouth daily before breakfast., Disp: 30 tablet, Rfl: 11 .  dicyclomine (BENTYL) 20 MG tablet, Take 1 tablet (20 mg total) by mouth 2 (two) times daily., Disp: 20 tablet, Rfl: 0 .  ELIQUIS 5 MG TABS tablet, TAKE 1 TABLET (5 MG TOTAL) BY MOUTH 2 (TWO) TIMES DAILY., Disp: 180 tablet, Rfl: 2 .  fenofibrate 54 MG tablet, TAKE 1 TABLET (54 MG TOTAL) BY MOUTH DAILY., Disp: 90 tablet, Rfl: 0 .  furosemide (LASIX) 20 MG tablet, Take 3 tablets (60 mg total) by mouth daily., Disp: 90 tablet, Rfl: 0 .  hydrALAZINE (APRESOLINE) 100 MG tablet, Take 1 tablet (100 mg total) by mouth 3 (three) times daily., Disp: 90 tablet, Rfl: 3 .  insulin glargine (LANTUS) 100 UNIT/ML injection, Inject 0.24 mLs (24 Units total) into the skin daily., Disp: 10 mL, Rfl: 11 .  isosorbide mononitrate (IMDUR) 60 MG 24 hr tablet, Take 1 tablet (60 mg total) by mouth daily., Disp: 30 tablet, Rfl: 11 .  Lancets MISC, 1 each by Does not apply route 3 (three) times daily as needed., Disp: 300 each, Rfl: 3 .  latanoprost (XALATAN) 0.005 % ophthalmic solution, Place 1 drop into both eyes at bedtime., Disp: , Rfl:  .  pantoprazole (PROTONIX) 40 MG tablet, Take 1 tablet (40 mg total) by mouth 2 (two) times daily., Disp: 180 tablet, Rfl: 3 .  VASCEPA 1 g capsule, TAKE TWO CAPSULES BY MOUTH  TWICE A DAY, Disp: 120 capsule, Rfl: 5 .  omeprazole (PRILOSEC) 20 MG capsule, Take 1 capsule (20 mg total) by mouth daily. (Patient not taking: Reported on 07/10/2020), Disp: 30 capsule, Rfl: 0 Allergies  Allergen Reactions  . Lisinopril Cough      Social History   Socioeconomic History  . Marital status: Legally Separated    Spouse name: Not on file  . Number of children: Not on file  . Years of education: Not on file  . Highest education level: Not on file   Occupational History  . Not on file  Tobacco Use  . Smoking status: Never Smoker  . Smokeless tobacco: Never Used  Vaping Use  . Vaping Use: Never used  Substance and Sexual Activity  . Alcohol use: Yes    Alcohol/week: 1.0 - 2.0 standard drink    Types: 1 - 2 Glasses of wine per week    Comment: rare  . Drug use: No  . Sexual activity: Yes  Other Topics Concern  . Not on file  Social History Narrative  . Not on file   Social Determinants of Health   Financial Resource Strain: Medium Risk  . Difficulty of Paying Living Expenses: Somewhat hard  Food Insecurity: No Food Insecurity  . Worried About Charity fundraiser in the Last Year: Never true  . Ran Out of Food in the Last Year: Never true  Transportation Needs: No Transportation Needs  . Lack of Transportation (Medical): No  . Lack of Transportation (Non-Medical): No  Physical Activity: Not on file  Stress: Not on file  Social Connections: Not on file  Intimate Partner Violence: Not on file    Physical Exam      Future Appointments  Date Time Provider [place]  07/14/2020  8:20 AM [name], FNP [facility] None  08/04/2020  8:00 PM Sueanne Margarita, MD [facility]  08/11/2020 11:30 AM [facility] PA/NP [facility] None  09/21/2020  8:35 AM [facility] DEVICE REMOTES [facility]  12/11/2020 10:00 AM [name], FNP [facility] None  12/21/2020  8:35 AM [facility] DEVICE REMOTES [facility]  03/22/2021  8:35 AM [facility] DEVICE REMOTES [facility]  06/21/2021  8:35 AM [facility] DEVICE REMOTES [facility]  09/20/2021  8:35 AM [facility] DEVICE REMOTES [facility]  12/20/2021  8:35 AM [facility] DEVICE REMOTES [facility]    BP (!) 164/100   Pulse 82   Resp 18   Wt 234 lb (106.1 kg)   SpO2 98%   BMI 33.58 kg/m   Pt recently admitted for CHF, he was d/c yesterday,  came out today for med rec and post d/c f/u. His lasix was  increased to '60mg'$  daily from '40mg'$ .  Gave him folder to record weights daily and note to remind him to check CBG's daily. He does a smidge of pitting edema to his lower legs. He has not taken meds yet today, he will do that now.  He denies sob, dizziness or c/p. meds verified and pill box refilled. Will see him again next week.   [name], [facility] Paramedic  07/10/20

## 2020-07-14 ENCOUNTER — Ambulatory Visit (INDEPENDENT_AMBULATORY_CARE_PROVIDER_SITE_OTHER): Payer: Medicare Other | Admitting: Family Medicine

## 2020-07-14 ENCOUNTER — Other Ambulatory Visit: Payer: Self-pay

## 2020-07-14 ENCOUNTER — Encounter: Payer: Self-pay | Admitting: Family Medicine

## 2020-07-14 VITALS — BP 93/79 | HR 70 | Ht 70.0 in | Wt 223.0 lb

## 2020-07-14 DIAGNOSIS — G4700 Insomnia, unspecified: Secondary | ICD-10-CM | POA: Diagnosis not present

## 2020-07-14 DIAGNOSIS — R42 Dizziness and giddiness: Secondary | ICD-10-CM | POA: Diagnosis not present

## 2020-07-14 DIAGNOSIS — H9193 Unspecified hearing loss, bilateral: Secondary | ICD-10-CM

## 2020-07-14 DIAGNOSIS — I10 Essential (primary) hypertension: Secondary | ICD-10-CM | POA: Diagnosis not present

## 2020-07-14 DIAGNOSIS — I5022 Chronic systolic (congestive) heart failure: Secondary | ICD-10-CM | POA: Diagnosis not present

## 2020-07-14 DIAGNOSIS — I509 Heart failure, unspecified: Secondary | ICD-10-CM

## 2020-07-14 DIAGNOSIS — Z794 Long term (current) use of insulin: Secondary | ICD-10-CM

## 2020-07-14 DIAGNOSIS — R7303 Prediabetes: Secondary | ICD-10-CM

## 2020-07-14 DIAGNOSIS — Z789 Other specified health status: Secondary | ICD-10-CM

## 2020-07-14 DIAGNOSIS — Z09 Encounter for follow-up examination after completed treatment for conditions other than malignant neoplasm: Secondary | ICD-10-CM

## 2020-07-14 DIAGNOSIS — Z758 Other problems related to medical facilities and other health care: Secondary | ICD-10-CM

## 2020-07-14 DIAGNOSIS — E118 Type 2 diabetes mellitus with unspecified complications: Secondary | ICD-10-CM

## 2020-07-14 DIAGNOSIS — E119 Type 2 diabetes mellitus without complications: Secondary | ICD-10-CM

## 2020-07-14 MED ORDER — MECLIZINE HCL 25 MG PO TABS: 25.0000 mg | ORAL_TABLET | Freq: Three times a day (TID) | ORAL | 3 refills | Status: DC | PRN

## 2020-07-14 NOTE — Progress Notes (Signed)
Patient [facility] Follow Up  Subjective:  Patient ID: Troy Rush, male    DOB: September 16, 1970  Age: 50 y.o. MRN: [id number]  CC:  Chief Complaint  Patient presents with  . Hospitalization Follow-up  . Dizziness    HPI Troy Rush is a 50 year old male who presents for Hospital Follow Up today.   Patient Active Problem List   Diagnosis Date Noted  . CKD (chronic kidney disease) stage 3, GFR 30-59 ml/min (HCC) 07/08/2020  . Acute CHF (congestive heart failure) ([place]) 07/07/2020  . CKD (chronic kidney disease), stage IV ([place]) 07/07/2020  . ICD (implantable cardioverter-defibrillator) in place 05/14/2020  . Pulmonary embolism ([place]) 02/05/2020  . Acute kidney injury superimposed on CKD ([place])   . Chronic combined systolic and diastolic heart failure ([place])   . Atrial fibrillation with RVR ([place]) 02/01/2020  . NSVT (nonsustained ventricular tachycardia) ([place]) 02/01/2020  . Acute GI bleeding 02/01/2020  . DKA (diabetic ketoacidosis) ([place]) 02/01/2020  . Bilateral deafness 08/15/2019  . Type 2 diabetes mellitus with complication, with long-term current use of insulin ([place]) 08/15/2019  . Hemoglobin A1C between 7% and 9% indicating borderline diabetic control 08/15/2019  . Insomnia 08/15/2019  . Abdominal pain 11/14/2018  . Ischemic cardiomyopathy 10/14/2016  . NICM (nonischemic cardiomyopathy) ([place]) 09/15/2016  . Chronic systolic CHF (congestive heart failure) ([place]) 05/30/2016  . HTN (hypertension) 05/30/2016  . Deaf 05/30/2016  . Hyperkalemia 05/30/2016  . Diabetes ([place]) 03/21/2016  . Major depressive disorder, recurrent episode ([place]) 03/21/2016  . Bronchitis 01/21/2015  . Dyslipidemia 01/21/2015  . Vitamin D deficiency 12/19/2011  . Personal history of other infectious and parasitic diseases 10/25/2011  . Anxiety disorder 05/12/2011  . Hyperlipidemia 05/12/2011  . Hypertriglyceridemia 05/12/2011  . Obstructive sleep apnea  05/12/2011   Current Status: Since his last office visit, he has had a Hospital Admission for Acute CHF on 07/07/2020-07/09/2020. Today, he states that he continues to have dizziness and insomnia. He is currently receiving PT/OT home services.  He is accompanied by deaf interpreter today. He has not been monitoring blood glucose levels regularly lately. He denies fatigue, frequent urination, blurred vision, excessive hunger, excessive thirst, weight gain, weight loss, and poor wound healing. He continues to check his feet regularly.  He will continue to take medications as prescribed, to decrease high sodium intake, excessive alcohol intake, increase potassium intake, smoking cessation, and increase physical activity of at least 30 minutes of cardio activity daily. He will continue to follow Heart Healthy or DASH diet. He denies fevers, chills, recent infections, weight loss, and night sweats. Denies GI problems such as diarrhea, and constipation. He has no reports of blood in stools, dysuria and hematuria. No depression or anxiety reported today. He is taking all medications as prescribed. He denies pain today.   Past Medical History:  Diagnosis Date  . AICD (automatic cardioverter/defibrillator) present 10/14/2016  . Bilateral deafness   . Bronchitis   . CHF (congestive heart failure) ([place])   . Depression   . Diabetes mellitus without complication ([place])   . DVT, lower extremity ([place]) 07/12/2019  . Dyslipidemia   . Headache 08/2019  . Hypertension   . ICD (implantable cardioverter-defibrillator) in place   . Insomnia 08/2019  . NICM (nonischemic cardiomyopathy) ([place]) 09/15/2016  . Pancreatitis   . Sleep apnea   . Vitamin D deficiency 08/2019    Past Surgical History:  Procedure Laterality Date  . BACK SURGERY    .  CARDIAC CATHETERIZATION    . ESOPHAGOGASTRODUODENOSCOPY (EGD) WITH PROPOFOL N/A 02/02/2020   Procedure: ESOPHAGOGASTRODUODENOSCOPY (EGD) WITH PROPOFOL;  Surgeon: Arta Silence, MD;  Location: [facility];  Service: Endoscopy;  Laterality: N/A;  . ICD IMPLANT  10/14/2016  . ICD IMPLANT N/A 10/14/2016   Procedure: ICD Implant;  Surgeon: Deboraha Sprang, MD;  Location: [facility];  Service: Cardiovascular;  Laterality: N/A;  . RIGHT HEART CATH N/A 05/21/2020   Procedure: RIGHT HEART CATH;  Surgeon: Larey Dresser, MD;  Location: [facility];  Service: Cardiovascular;  Laterality: N/A;  . WRIST SURGERY      Family History  Problem Relation Age of Onset  . Hypertension Mother   . Healthy Father   . Stroke Father     Social History   Socioeconomic History  . Marital status: Legally Separated    Spouse name: Not on file  . Number of children: Not on file  . Years of education: Not on file  . Highest education level: Not on file  Occupational History  . Not on file  Tobacco Use  . Smoking status: Never Smoker  . Smokeless tobacco: Never Used  Vaping Use  . Vaping Use: Never used  Substance and Sexual Activity  . Alcohol use: Yes    Alcohol/week: 1.0 - 2.0 standard drink    Types: 1 - 2 Glasses of wine per week    Comment: rare  . Drug use: No  . Sexual activity: Yes  Other Topics Concern  . Not on file  Social History Narrative  . Not on file   Social Determinants of Health   Financial Resource Strain: Medium Risk  . Difficulty of Paying Living Expenses: Somewhat hard  Food Insecurity: No Food Insecurity  . Worried About Charity fundraiser in the Last Year: Never true  . Ran Out of Food in the Last Year: Never true  Transportation Needs: No Transportation Needs  . Lack of Transportation (Medical): No  . Lack of Transportation (Non-Medical): No  Physical Activity: Not on file  Stress: Not on file  Social Connections: Not on file  Intimate Partner Violence: Not on file    Outpatient Medications Prior to Visit  Medication Sig Dispense Refill  . amiodarone (PACERONE) 200 MG tablet Take 0.5 tablets (100 mg total) by  mouth daily. 60 tablet 2  . atorvastatin (LIPITOR) 80 MG tablet Take 1 tablet (80 mg total) by mouth at bedtime. 30 tablet 11  . carvedilol (COREG) 25 MG tablet Take 1 tablet (25 mg total) by mouth 2 (two) times daily with a meal. 60 tablet 6  . dapagliflozin propanediol (FARXIGA) 10 MG TABS tablet Take 1 tablet (10 mg total) by mouth daily before breakfast. 30 tablet 11  . dicyclomine (BENTYL) 20 MG tablet Take 1 tablet (20 mg total) by mouth 2 (two) times daily. 20 tablet 0  . ELIQUIS 5 MG TABS tablet TAKE 1 TABLET (5 MG TOTAL) BY MOUTH 2 (TWO) TIMES DAILY. 180 tablet 2  . fenofibrate 54 MG tablet TAKE 1 TABLET (54 MG TOTAL) BY MOUTH DAILY. 90 tablet 0  . furosemide (LASIX) 20 MG tablet Take 3 tablets (60 mg total) by mouth daily. 90 tablet 0  . hydrALAZINE (APRESOLINE) 100 MG tablet Take 1 tablet (100 mg total) by mouth 3 (three) times daily. 90 tablet 3  . insulin glargine (LANTUS) 100 UNIT/ML injection Inject 0.24 mLs (24 Units total) into the skin daily. 10 mL 11  .  isosorbide mononitrate (IMDUR) 60 MG 24 hr tablet Take 1 tablet (60 mg total) by mouth daily. 30 tablet 11  . Lancets MISC 1 each by Does not apply route 3 (three) times daily as needed. 300 each 3  . latanoprost (XALATAN) 0.005 % ophthalmic solution Place 1 drop into both eyes at bedtime.    Marland Kitchen omeprazole (PRILOSEC) 20 MG capsule Take 1 capsule (20 mg total) by mouth daily. 30 capsule 0  . pantoprazole (PROTONIX) 40 MG tablet Take 1 tablet (40 mg total) by mouth 2 (two) times daily. 180 tablet 3  . VASCEPA 1 g capsule TAKE TWO CAPSULES BY MOUTH TWICE A DAY 120 capsule 5   No facility-administered medications prior to visit.    Allergies  Allergen Reactions  . Lisinopril Cough    ROS Review of Systems  Constitutional: Negative.   HENT: Negative.   Eyes: Negative.   Respiratory: Positive for cough (occasional ) and shortness of breath (occasional ).   Cardiovascular: Negative.   Gastrointestinal: Positive for abdominal  distention (obese).  Endocrine: Negative.   Genitourinary: Negative.   Musculoskeletal: Positive for arthralgias (generalized ).  Skin: Negative.   Allergic/Immunologic: Negative.   Neurological: Positive for dizziness (occasional ) and headaches (occasional ).  Hematological: Negative.   Psychiatric/Behavioral: Negative.       Objective:    Physical Exam Vitals and nursing note reviewed.  Constitutional:      Appearance: Normal appearance.  HENT:     Head: Normocephalic and atraumatic.     Nose: Nose normal.     Mouth/Throat:     Mouth: Mucous membranes are moist.     Pharynx: Oropharynx is clear.  Cardiovascular:     Rate and Rhythm: Normal rate and regular rhythm.     Pulses: Normal pulses.     Heart sounds: Normal heart sounds.  Pulmonary:     Effort: Pulmonary effort is normal.     Breath sounds: Normal breath sounds.  Abdominal:     General: Bowel sounds are normal. There is distension.     Palpations: Abdomen is soft.  Musculoskeletal:        General: Normal range of motion.     Cervical back: Normal range of motion and neck supple.  Skin:    General: Skin is warm and dry.  Neurological:     General: No focal deficit present.     Mental Status: He is alert.  Psychiatric:        Mood and Affect: Mood normal.        Behavior: Behavior normal.        Thought Content: Thought content normal.        Judgment: Judgment normal.     BP 93/79   Pulse 70   Ht '5\' 10"'$  (1.778 m)   Wt 223 lb (101.2 kg)   SpO2 98%   BMI 32.00 kg/m  Wt Readings from Last 3 Encounters:  07/14/20 223 lb (101.2 kg)  07/10/20 234 lb (106.1 kg)  07/09/20 232 lb 9.6 oz (105.5 kg)     Health Maintenance Due  Topic Date Due  . Hepatitis C Screening  Never done  . COLONOSCOPY (Pts 45-69yr Insurance coverage will need to be confirmed)  Never done  . OPHTHALMOLOGY EXAM  05/30/2017  . FOOT EXAM  08/08/2018    There are no preventive care reminders to display for this  patient.  Lab Results  Component Value Date   TSH 8.683 (H) 07/08/2020   Lab  Results  Component Value Date   WBC 4.1 07/08/2020   HGB 13.0 07/08/2020   HCT 39.7 07/08/2020   MCV 86.5 07/08/2020   PLT 222 07/08/2020   Lab Results  Component Value Date   NA 133 (L) 07/09/2020   K 4.3 07/09/2020   CO2 25 07/09/2020   GLUCOSE 289 (H) 07/09/2020   BUN 48 (H) 07/09/2020   CREATININE 2.94 (H) 07/09/2020   BILITOT 0.9 07/08/2020   ALKPHOS 42 07/08/2020   AST 31 07/08/2020   ALT 19 07/08/2020   PROT 7.3 07/08/2020   ALBUMIN 3.6 07/08/2020   CALCIUM 9.2 07/09/2020   ANIONGAP 8 07/09/2020   Lab Results  Component Value Date   CHOL 158 04/30/2020   Lab Results  Component Value Date   HDL 38 (L) 04/30/2020   Lab Results  Component Value Date   LDLCALC 76 04/30/2020   Lab Results  Component Value Date   TRIG 220 (H) 04/30/2020   Lab Results  Component Value Date   CHOLHDL 4.2 04/30/2020   Lab Results  Component Value Date   HGBA1C 8.8 (H) 07/08/2020    Assessment & Plan:   1. Language barrier  2. Bilateral deafness  3. Hospital discharge follow-up  4. Acute congestive heart failure, unspecified heart failure type ([place]) Stable. No signs or symptoms of respiratory distress noted or reported today.   5. Chronic systolic CHF (congestive heart failure) ([place])  6. Type 2 diabetes mellitus with complication, with long-term current use of insulin ([place])  He will continue medication as prescribed, to decrease foods/beverages high in sugars and carbs and follow Heart Healthy or DASH diet. Increase physical activity to at least 30 minutes cardio exercise daily.   7. Hemoglobin A1C between 7% and 9% indicating borderline diabetic control Hgb A1c at 8.0. Monitor.  8. Essential hypertension The current medical regimen is effective; blood pressure is stable at 93/79 today; continue present plan and medications as prescribed. He will continue to take medications as  prescribed, to decrease high sodium intake, excessive alcohol intake, increase potassium intake, smoking cessation, and increase physical activity of at least 30 minutes of cardio activity daily. He will continue to follow Heart Healthy or DASH diet.  9. Dizziness Stable today.   10. Insomnia  11. Follow up He will follow up in 3 months.   No orders of the defined types were placed in this encounter. No orders of the defined types were placed in this encounter.   Referral Orders  No referral(s) requested today    Kathe Becton, MSN, ANE, FNP-BC [facility] [address] [place], [place] [address] [phone] fax  Problem List Items Addressed This Visit      Cardiovascular and Mediastinum   Acute CHF (congestive heart failure) ([place])   Chronic systolic CHF (congestive heart failure) (HCC) (Chronic)     Endocrine   Hemoglobin A1C between 7% and 9% indicating borderline diabetic control   Type 2 diabetes mellitus with complication, with long-term current use of insulin [facility])    Other Visit Diagnoses    Hospital discharge follow-up    -  Primary   Essential hypertension       Follow up          No orders of the defined types were placed in this encounter.   Follow-up: No follow-ups on file.    [name], FNP

## 2020-07-14 NOTE — Patient Instructions (Signed)
Meclizine tablets or capsules What is this medicine? MECLIZINE (MEK li zeen) is an antihistamine. It is used to prevent nausea, vomiting, or dizziness caused by motion sickness. It is also used to prevent and treat vertigo (extreme dizziness or a feeling that you or your surroundings are tilting or spinning around). This medicine may be used for other purposes; ask your health care provider or pharmacist if you have questions. COMMON BRAND NAME(S): Antivert, Dramamine Less Drowsy, Dramamine-N, Medivert, Meni-D What should I tell my health care provider before I take this medicine? They need to know if you have any of these conditions:  glaucoma  lung or breathing disease, like asthma  problems urinating  prostate disease  stomach or intestine problems  an unusual or allergic reaction to meclizine, other medicines, foods, dyes, or preservatives  pregnant or trying to get pregnant  breast-feeding How should I use this medicine? Take this medicine by mouth with a glass of water. Follow the directions on the prescription label. If you are using this medicine to prevent motion sickness, take the dose at least 1 hour before travel. If it upsets your stomach, take it with food or milk. Take your doses at regular intervals. Do not take your medicine more often than directed. Talk to your pediatrician regarding the use of this medicine in children. Special care may be needed. Overdosage: If you think you have taken too much of this medicine contact a poison control center or emergency room at once. NOTE: This medicine is only for you. Do not share this medicine with others. What if I miss a dose? If you miss a dose, take it as soon as you can. If it is almost time for your next dose, take only that dose. Do not take double or extra doses. What may interact with this medicine? Do not take this medicine with any of the following medications:  MAOIs like Carbex, Eldepryl, Marplan, Nardil, and  Parnate This medicine may also interact with the following medications:  alcohol  antihistamines for allergy, cough and cold  certain medicines for anxiety or sleep  certain medicines for depression, like amitriptyline, fluoxetine, sertraline  certain medicines for seizures like phenobarbital, primidone  general anesthetics like halothane, isoflurane, methoxyflurane, propofol  local anesthetics like lidocaine, pramoxine, tetracaine  medicines that relax muscles for surgery  narcotic medicines for pain  phenothiazines like chlorpromazine, mesoridazine, prochlorperazine, thioridazine This list may not describe all possible interactions. Give your health care provider a list of all the medicines, herbs, non-prescription drugs, or dietary supplements you use. Also tell them if you smoke, drink alcohol, or use illegal drugs. Some items may interact with your medicine. What should I watch for while using this medicine? Tell your doctor or healthcare professional if your symptoms do not start to get better or if they get worse. You may get drowsy or dizzy. Do not drive, use machinery, or do anything that needs mental alertness until you know how this medicine affects you. Do not stand or sit up quickly, especially if you are an older patient. This reduces the risk of dizzy or fainting spells. Alcohol may interfere with the effect of this medicine. Avoid alcoholic drinks. Your mouth may get dry. Chewing sugarless gum or sucking hard candy, and drinking plenty of water may help. Contact your doctor if the problem does not go away or is severe. This medicine may cause dry eyes and blurred vision. If you wear contact lenses you may feel some discomfort. Lubricating drops may  help. See your eye doctor if the problem does not go away or is severe. What side effects may I notice from receiving this medicine? Side effects that you should report to your doctor or health care professional as soon as  possible:  feeling faint or lightheaded, falls  fast, irregular heartbeat Side effects that usually do not require medical attention (report to your doctor or health care professional if they continue or are bothersome):  constipation  headache  trouble passing urine or change in the amount of urine  trouble sleeping  upset stomach This list may not describe all possible side effects. Call your doctor for medical advice about side effects. You may report side effects to FDA at 1-800-FDA-1088. Where should I keep my medicine? Keep out of the reach of children. Store at room temperature between 15 and 30 degrees C (59 and 86 degrees F). Keep container tightly closed. Throw away any unused medicine after the expiration date. NOTE: This sheet is a summary. It may not cover all possible information. If you have questions about this medicine, talk to your doctor, pharmacist, or health care provider.  2021 Elsevier/Gold Standard (2015-04-29 19:41:02) Dizziness Dizziness is a common problem. It makes you feel unsteady or light-headed. You may feel like you are about to pass out (faint). Dizziness can lead to getting hurt if you stumble or fall. Dizziness can be caused by many things, including:  Medicines.  Not having enough water in your body (dehydration).  Illness. Follow these instructions at home: Eating and drinking  Drink enough fluid to keep your pee (urine) clear or pale yellow. This helps to keep you from getting dehydrated. Try to drink more clear fluids, such as water.  Do not drink alcohol.  Limit how much caffeine you drink or eat, if your doctor tells you to do that.  Limit how much salt (sodium) you drink or eat, if your doctor tells you to do that.   Activity  Avoid making quick movements. ? When you stand up from sitting in a chair, steady yourself until you feel okay. ? In the morning, first sit up on the side of the bed. When you feel okay, stand slowly while  you hold onto something. Do this until you know that your balance is fine.  If you need to stand in one place for a long time, move your legs often. Tighten and relax the muscles in your legs while you are standing.  Do not drive or use heavy machinery if you feel dizzy.  Avoid bending down if you feel dizzy. Place items in your home so you can reach them easily without leaning over.   Lifestyle  Do not use any products that contain nicotine or tobacco, such as cigarettes and e-cigarettes. If you need help quitting, ask your doctor.  Try to lower your stress level. You can do this by using methods such as yoga or meditation. Talk with your doctor if you need help. General instructions  Watch your dizziness for any changes.  Take over-the-counter and prescription medicines only as told by your doctor. Talk with your doctor if you think that you are dizzy because of a medicine that you are taking.  Tell a friend or a family member that you are feeling dizzy. If he or she notices any changes in your behavior, have this person call your doctor.  Keep all follow-up visits as told by your doctor. This is important. Contact a doctor if:  Your dizziness does  not go away.  Your dizziness or light-headedness gets worse.  You feel sick to your stomach (nauseous).  You have trouble hearing.  You have new symptoms.  You are unsteady on your feet.  You feel like the room is spinning. Get help right away if:  You throw up (vomit) or have watery poop (diarrhea), and you cannot eat or drink anything.  You have trouble: ? Talking. ? Walking. ? Swallowing. ? Using your arms, hands, or legs.  You feel generally weak.  You are not thinking clearly, or you have trouble forming sentences. A friend or family member may notice this.  You have: ? Chest pain. ? Pain in your belly (abdomen). ? Shortness of breath. ? Sweating.  Your vision changes.  You are bleeding.  You have a very  bad headache.  You have neck pain or a stiff neck.  You have a fever. These symptoms may be an emergency. Do not wait to see if the symptoms will go away. Get medical help right away. Call your local emergency services (911 in the [place] Do not drive yourself to the hospital. Summary  Dizziness makes you feel unsteady or light-headed. You may feel like you are about to pass out (faint).  Drink enough fluid to keep your pee (urine) clear or pale yellow. Do not drink alcohol.  Avoid making quick movements if you feel dizzy.  Watch your dizziness for any changes. This information is not intended to replace advice given to you by your health care provider. Make sure you discuss any questions you have with your health care provider. Document Revised: 12/18/2019 Document Reviewed: 04/14/2016 Elsevier Patient Education  [place].

## 2020-07-15 ENCOUNTER — Other Ambulatory Visit (HOSPITAL_COMMUNITY): Payer: Self-pay

## 2020-07-15 NOTE — Progress Notes (Signed)
Paramedicine Encounter    Patient ID: Troy Rush, male    DOB: 05-22-1970, 50 y.o.   MRN: [id number]   Patient Care Team: [name], FNP as PCP - General (Family Medicine) Larey Dresser, MD as PCP - Cardiology (Cardiology) Conrad Pendleton, NP as Nurse Practitioner (Cardiology) Larey Dresser, MD as Consulting Physician (Cardiology) Jorge Ny, LCSW as Social Worker (Licensed Clinical Social Worker)  Patient Active Problem List   Diagnosis Date Noted  . CKD (chronic kidney disease) stage 3, GFR 30-59 ml/min (HCC) 07/08/2020  . Acute CHF (congestive heart failure) ([place]) 07/07/2020  . CKD (chronic kidney disease), stage IV ([place]) 07/07/2020  . ICD (implantable cardioverter-defibrillator) in place 05/14/2020  . Pulmonary embolism ([place]) 02/05/2020  . Acute kidney injury superimposed on CKD ([place])   . Chronic combined systolic and diastolic heart failure ([place])   . Atrial fibrillation with RVR ([place]) 02/01/2020  . NSVT (nonsustained ventricular tachycardia) ([place]) 02/01/2020  . Acute GI bleeding 02/01/2020  . DKA (diabetic ketoacidosis) ([place]) 02/01/2020  . Bilateral deafness 08/15/2019  . Type 2 diabetes mellitus with complication, with long-term current use of insulin ([place]) 08/15/2019  . Hemoglobin A1C between 7% and 9% indicating borderline diabetic control 08/15/2019  . Insomnia 08/15/2019  . Abdominal pain 11/14/2018  . Ischemic cardiomyopathy 10/14/2016  . NICM (nonischemic cardiomyopathy) ([place]) 09/15/2016  . Chronic systolic CHF (congestive heart failure) ([place]) 05/30/2016  . HTN (hypertension) 05/30/2016  . Deaf 05/30/2016  . Hyperkalemia 05/30/2016  . Diabetes ([place]) 03/21/2016  . Major depressive disorder, recurrent episode ([place]) 03/21/2016  . Bronchitis 01/21/2015  . Dyslipidemia 01/21/2015  . Vitamin D deficiency 12/19/2011  . Personal history of other infectious and parasitic diseases 10/25/2011  . Anxiety disorder 05/12/2011  . Hyperlipidemia 05/12/2011  .  Hypertriglyceridemia 05/12/2011  . Obstructive sleep apnea 05/12/2011    Current Outpatient Medications:  .  amiodarone (PACERONE) 200 MG tablet, Take 0.5 tablets (100 mg total) by mouth daily., Disp: 60 tablet, Rfl: 2 .  atorvastatin (LIPITOR) 80 MG tablet, Take 1 tablet (80 mg total) by mouth at bedtime., Disp: 30 tablet, Rfl: 11 .  carvedilol (COREG) 25 MG tablet, Take 1 tablet (25 mg total) by mouth 2 (two) times daily with a meal., Disp: 60 tablet, Rfl: 6 .  dapagliflozin propanediol (FARXIGA) 10 MG TABS tablet, Take 1 tablet (10 mg total) by mouth daily before breakfast., Disp: 30 tablet, Rfl: 11 .  dicyclomine (BENTYL) 20 MG tablet, Take 1 tablet (20 mg total) by mouth 2 (two) times daily., Disp: 20 tablet, Rfl: 0 .  ELIQUIS 5 MG TABS tablet, TAKE 1 TABLET (5 MG TOTAL) BY MOUTH 2 (TWO) TIMES DAILY., Disp: 180 tablet, Rfl: 2 .  fenofibrate 54 MG tablet, TAKE 1 TABLET (54 MG TOTAL) BY MOUTH DAILY., Disp: 90 tablet, Rfl: 0 .  furosemide (LASIX) 20 MG tablet, Take 3 tablets (60 mg total) by mouth daily., Disp: 90 tablet, Rfl: 0 .  hydrALAZINE (APRESOLINE) 100 MG tablet, Take 1 tablet (100 mg total) by mouth 3 (three) times daily., Disp: 90 tablet, Rfl: 3 .  insulin glargine (LANTUS) 100 UNIT/ML injection, Inject 0.24 mLs (24 Units total) into the skin daily., Disp: 10 mL, Rfl: 11 .  isosorbide mononitrate (IMDUR) 60 MG 24 hr tablet, Take 1 tablet (60 mg total) by mouth daily., Disp: 30 tablet, Rfl: 11 .  Lancets MISC, 1 each by Does not apply route 3 (three) times daily as needed., Disp: 300 each, Rfl: 3 .  latanoprost (XALATAN) 0.005 % ophthalmic solution, Place 1 drop into both eyes at bedtime., Disp: , Rfl:  .  meclizine (ANTIVERT) 25 MG tablet, Take 1 tablet (25 mg total) by mouth 3 (three) times daily as needed for dizziness., Disp: 90 tablet, Rfl: 3 .  pantoprazole (PROTONIX) 40 MG tablet, Take 1 tablet (40 mg total) by mouth 2 (two) times daily., Disp: 180 tablet, Rfl: 3 .  VASCEPA 1 g  capsule, TAKE TWO CAPSULES BY MOUTH TWICE A DAY, Disp: 120 capsule, Rfl: 5 .  omeprazole (PRILOSEC) 20 MG capsule, Take 1 capsule (20 mg total) by mouth daily. (Patient not taking: Reported on 07/15/2020), Disp: 30 capsule, Rfl: 0 Allergies  Allergen Reactions  . Lisinopril Cough      Social History   Socioeconomic History  . Marital status: Legally Separated    Spouse name: Not on file  . Number of children: Not on file  . Years of education: Not on file  . Highest education level: Not on file  Occupational History  . Not on file  Tobacco Use  . Smoking status: Never Smoker  . Smokeless tobacco: Never Used  Vaping Use  . Vaping Use: Never used  Substance and Sexual Activity  . Alcohol use: Yes    Alcohol/week: 1.0 - 2.0 standard drink    Types: 1 - 2 Glasses of wine per week    Comment: rare  . Drug use: No  . Sexual activity: Yes  Other Topics Concern  . Not on file  Social History Narrative  . Not on file   Social Determinants of Health   Financial Resource Strain: Medium Risk  . Difficulty of Paying Living Expenses: Somewhat hard  Food Insecurity: No Food Insecurity  . Worried About Charity fundraiser in the Last Year: Never true  . Ran Out of Food in the Last Year: Never true  Transportation Needs: No Transportation Needs  . Lack of Transportation (Medical): No  . Lack of Transportation (Non-Medical): No  Physical Activity: Not on file  Stress: Not on file  Social Connections: Not on file  Intimate Partner Violence: Not on file    Physical Exam      Future Appointments  Date Time Provider [place]  08/04/2020  8:00 PM Sueanne Margarita, MD [facility]  08/11/2020 11:30 AM [facility] PA/NP [facility] None  09/21/2020  8:35 AM [facility] DEVICE REMOTES [facility]  10/14/2020 10:40 AM Vevelyn Francois, NP [facility] None  10/15/2020  8:40 AM Vevelyn Francois, NP [facility] None  12/11/2020 10:00 AM [name], FNP [facility] None  12/21/2020  8:35  AM [facility] DEVICE REMOTES [facility]  03/22/2021  8:35 AM [facility] DEVICE REMOTES [facility]  06/21/2021  8:35 AM [facility] DEVICE REMOTES [facility]  09/20/2021  8:35 AM [facility] DEVICE REMOTES [facility]  12/20/2021  8:35 AM [facility] DEVICE REMOTES [facility]    BP 110/68   Pulse 72   Resp 18   Wt 233 lb (105.7 kg)   SpO2 98%   BMI 33.43 kg/m   Weight yesterday-? Last visit weight-234  Pt was seen at PCP this week and was prescribed meclizine-I p/u from pharmacy for him and will add to pill box.  He does have some dizziness, he denies c/p, no increased sob, no edema noted.  No missed doses of his meds. meds verified and pill box refilled.  He has a booklet from center home [facility] for RN. Looks like there was a  note to him that she had to get some orders signed to return for PT/OT.    [name], [facility] Paramedic  07/15/20

## 2020-07-20 ENCOUNTER — Encounter (HOSPITAL_COMMUNITY): Payer: Medicare Other

## 2020-07-21 ENCOUNTER — Encounter: Payer: Self-pay | Admitting: Family Medicine

## 2020-07-22 ENCOUNTER — Other Ambulatory Visit (HOSPITAL_COMMUNITY): Payer: Self-pay

## 2020-07-22 NOTE — Progress Notes (Signed)
Paramedicine Encounter    Patient ID: Troy Rush, male    DOB: 1970/08/13, 50 y.o.   MRN: [id number]   Patient Care Team: [name], FNP as PCP - General (Family Medicine) Larey Dresser, MD as PCP - Cardiology (Cardiology) Conrad Edna, NP as Nurse Practitioner (Cardiology) Larey Dresser, MD as Consulting Physician (Cardiology) Jorge Ny, LCSW as Social Worker (Licensed Clinical Social Worker)  Patient Active Problem List   Diagnosis Date Noted  . CKD (chronic kidney disease) stage 3, GFR 30-59 ml/min (HCC) 07/08/2020  . Acute CHF (congestive heart failure) ([place]) 07/07/2020  . CKD (chronic kidney disease), stage IV ([place]) 07/07/2020  . ICD (implantable cardioverter-defibrillator) in place 05/14/2020  . Pulmonary embolism ([place]) 02/05/2020  . Acute kidney injury superimposed on CKD ([place])   . Chronic combined systolic and diastolic heart failure ([place])   . Atrial fibrillation with RVR ([place]) 02/01/2020  . NSVT (nonsustained ventricular tachycardia) ([place]) 02/01/2020  . Acute GI bleeding 02/01/2020  . DKA (diabetic ketoacidosis) ([place]) 02/01/2020  . Bilateral deafness 08/15/2019  . Type 2 diabetes mellitus with complication, with long-term current use of insulin ([place]) 08/15/2019  . Hemoglobin A1C between 7% and 9% indicating borderline diabetic control 08/15/2019  . Insomnia 08/15/2019  . Abdominal pain 11/14/2018  . Ischemic cardiomyopathy 10/14/2016  . NICM (nonischemic cardiomyopathy) ([place]) 09/15/2016  . Chronic systolic CHF (congestive heart failure) ([place]) 05/30/2016  . HTN (hypertension) 05/30/2016  . Deaf 05/30/2016  . Hyperkalemia 05/30/2016  . Diabetes ([place]) 03/21/2016  . Major depressive disorder, recurrent episode ([place]) 03/21/2016  . Bronchitis 01/21/2015  . Dyslipidemia 01/21/2015  . Vitamin D deficiency 12/19/2011  . Personal history of other infectious and parasitic diseases 10/25/2011  . Anxiety disorder 05/12/2011  . Hyperlipidemia 05/12/2011  .  Hypertriglyceridemia 05/12/2011  . Obstructive sleep apnea 05/12/2011    Current Outpatient Medications:  .  amiodarone (PACERONE) 200 MG tablet, Take 0.5 tablets (100 mg total) by mouth daily., Disp: 60 tablet, Rfl: 2 .  atorvastatin (LIPITOR) 80 MG tablet, Take 1 tablet (80 mg total) by mouth at bedtime., Disp: 30 tablet, Rfl: 11 .  carvedilol (COREG) 25 MG tablet, Take 1 tablet (25 mg total) by mouth 2 (two) times daily with a meal., Disp: 60 tablet, Rfl: 6 .  dapagliflozin propanediol (FARXIGA) 10 MG TABS tablet, Take 1 tablet (10 mg total) by mouth daily before breakfast., Disp: 30 tablet, Rfl: 11 .  dicyclomine (BENTYL) 20 MG tablet, Take 1 tablet (20 mg total) by mouth 2 (two) times daily., Disp: 20 tablet, Rfl: 0 .  ELIQUIS 5 MG TABS tablet, TAKE 1 TABLET (5 MG TOTAL) BY MOUTH 2 (TWO) TIMES DAILY., Disp: 180 tablet, Rfl: 2 .  fenofibrate 54 MG tablet, TAKE 1 TABLET (54 MG TOTAL) BY MOUTH DAILY., Disp: 90 tablet, Rfl: 0 .  furosemide (LASIX) 20 MG tablet, Take 3 tablets (60 mg total) by mouth daily., Disp: 90 tablet, Rfl: 0 .  hydrALAZINE (APRESOLINE) 100 MG tablet, Take 1 tablet (100 mg total) by mouth 3 (three) times daily., Disp: 90 tablet, Rfl: 3 .  insulin glargine (LANTUS) 100 UNIT/ML injection, Inject 0.24 mLs (24 Units total) into the skin daily., Disp: 10 mL, Rfl: 11 .  isosorbide mononitrate (IMDUR) 60 MG 24 hr tablet, Take 1 tablet (60 mg total) by mouth daily., Disp: 30 tablet, Rfl: 11 .  Lancets MISC, 1 each by Does not apply route 3 (three) times daily as needed., Disp: 300 each, Rfl: 3 .  latanoprost (XALATAN) 0.005 % ophthalmic solution, Place 1 drop into both eyes at bedtime., Disp: , Rfl:  .  meclizine (ANTIVERT) 25 MG tablet, Take 1 tablet (25 mg total) by mouth 3 (three) times daily as needed for dizziness., Disp: 90 tablet, Rfl: 3 .  pantoprazole (PROTONIX) 40 MG tablet, Take 1 tablet (40 mg total) by mouth 2 (two) times daily., Disp: 180 tablet, Rfl: 3 .  VASCEPA 1 g  capsule, TAKE TWO CAPSULES BY MOUTH TWICE A DAY, Disp: 120 capsule, Rfl: 5 .  omeprazole (PRILOSEC) 20 MG capsule, Take 1 capsule (20 mg total) by mouth daily. (Patient not taking: No sig reported), Disp: 30 capsule, Rfl: 0 Allergies  Allergen Reactions  . Lisinopril Cough      Social History   Socioeconomic History  . Marital status: Legally Separated    Spouse name: Not on file  . Number of children: Not on file  . Years of education: Not on file  . Highest education level: Not on file  Occupational History  . Not on file  Tobacco Use  . Smoking status: Never Smoker  . Smokeless tobacco: Never Used  Vaping Use  . Vaping Use: Never used  Substance and Sexual Activity  . Alcohol use: Yes    Alcohol/week: 1.0 - 2.0 standard drink    Types: 1 - 2 Glasses of wine per week    Comment: rare  . Drug use: No  . Sexual activity: Yes  Other Topics Concern  . Not on file  Social History Narrative  . Not on file   Social Determinants of Health   Financial Resource Strain: Medium Risk  . Difficulty of Paying Living Expenses: Somewhat hard  Food Insecurity: No Food Insecurity  . Worried About Charity fundraiser in the Last Year: Never true  . Ran Out of Food in the Last Year: Never true  Transportation Needs: No Transportation Needs  . Lack of Transportation (Medical): No  . Lack of Transportation (Non-Medical): No  Physical Activity: Not on file  Stress: Not on file  Social Connections: Not on file  Intimate Partner Violence: Not on file    Physical Exam      Future Appointments  Date Time Provider [place]  08/04/2020  8:00 PM Sueanne Margarita, MD [facility]  08/11/2020 11:30 AM [facility] PA/NP [facility] None  09/21/2020  8:35 AM [facility] DEVICE REMOTES [facility]  10/14/2020 10:40 AM Vevelyn Francois, NP [facility] None  12/21/2020  8:35 AM [facility] DEVICE REMOTES [facility]  03/22/2021  8:35 AM [facility] DEVICE REMOTES  [facility]  06/21/2021  8:35 AM [facility] DEVICE REMOTES [facility]  09/20/2021  8:35 AM [facility] DEVICE REMOTES [facility]  12/20/2021  8:35 AM [facility] DEVICE REMOTES [facility]    BP (!) 150/78   Pulse 76   Resp 18   Wt 235 lb (106.6 kg)   SpO2 99%   BMI 33.72 kg/m   Weight yesterday-? Last visit weight-233  He reports doing ok, the dizziness slightly better since starting the meclizine, he has noticed increased urine output since lasix increased. Slight pitting edema to lower legs.  Had to make it a quicker visit as he was waiting on [employer] to come get him to take him to work.  --needs farxiga, atorvastatin, isosorbide, -called them in and will pick up for him.   [name], [facility] Paramedic  07/22/20

## 2020-07-29 ENCOUNTER — Other Ambulatory Visit (HOSPITAL_COMMUNITY): Payer: Self-pay

## 2020-07-29 NOTE — Progress Notes (Signed)
Paramedicine Encounter    Patient ID: Troy Rush, male    DOB: 08-12-1970, 50 y.o.   MRN: [id number]   Patient Care Team: [name], FNP as PCP - General (Family Medicine) Larey Dresser, MD as PCP - Cardiology (Cardiology) Conrad Red Lion, NP as Nurse Practitioner (Cardiology) Larey Dresser, MD as Consulting Physician (Cardiology) Jorge Ny, LCSW as Social Worker (Licensed Clinical Social Worker)  Patient Active Problem List   Diagnosis Date Noted  . CKD (chronic kidney disease) stage 3, GFR 30-59 ml/min (HCC) 07/08/2020  . Acute CHF (congestive heart failure) ([place]) 07/07/2020  . CKD (chronic kidney disease), stage IV ([place]) 07/07/2020  . ICD (implantable cardioverter-defibrillator) in place 05/14/2020  . Pulmonary embolism ([place]) 02/05/2020  . Acute kidney injury superimposed on CKD ([place])   . Chronic combined systolic and diastolic heart failure ([place])   . Atrial fibrillation with RVR ([place]) 02/01/2020  . NSVT (nonsustained ventricular tachycardia) ([place]) 02/01/2020  . Acute GI bleeding 02/01/2020  . DKA (diabetic ketoacidosis) ([place]) 02/01/2020  . Bilateral deafness 08/15/2019  . Type 2 diabetes mellitus with complication, with long-term current use of insulin ([place]) 08/15/2019  . Hemoglobin A1C between 7% and 9% indicating borderline diabetic control 08/15/2019  . Insomnia 08/15/2019  . Abdominal pain 11/14/2018  . Ischemic cardiomyopathy 10/14/2016  . NICM (nonischemic cardiomyopathy) ([place]) 09/15/2016  . Chronic systolic CHF (congestive heart failure) ([place]) 05/30/2016  . HTN (hypertension) 05/30/2016  . Deaf 05/30/2016  . Hyperkalemia 05/30/2016  . Diabetes ([place]) 03/21/2016  . Major depressive disorder, recurrent episode ([place]) 03/21/2016  . Bronchitis 01/21/2015  . Dyslipidemia 01/21/2015  . Vitamin D deficiency 12/19/2011  . Personal history of other infectious and parasitic diseases 10/25/2011  . Anxiety disorder 05/12/2011  . Hyperlipidemia 05/12/2011  .  Hypertriglyceridemia 05/12/2011  . Obstructive sleep apnea 05/12/2011    Current Outpatient Medications:  .  amiodarone (PACERONE) 200 MG tablet, Take 0.5 tablets (100 mg total) by mouth daily., Disp: 60 tablet, Rfl: 2 .  atorvastatin (LIPITOR) 80 MG tablet, Take 1 tablet (80 mg total) by mouth at bedtime., Disp: 30 tablet, Rfl: 11 .  carvedilol (COREG) 25 MG tablet, Take 1 tablet (25 mg total) by mouth 2 (two) times daily with a meal., Disp: 60 tablet, Rfl: 6 .  dapagliflozin propanediol (FARXIGA) 10 MG TABS tablet, Take 1 tablet (10 mg total) by mouth daily before breakfast., Disp: 30 tablet, Rfl: 11 .  dicyclomine (BENTYL) 20 MG tablet, Take 1 tablet (20 mg total) by mouth 2 (two) times daily., Disp: 20 tablet, Rfl: 0 .  ELIQUIS 5 MG TABS tablet, TAKE 1 TABLET (5 MG TOTAL) BY MOUTH 2 (TWO) TIMES DAILY., Disp: 180 tablet, Rfl: 2 .  fenofibrate 54 MG tablet, TAKE 1 TABLET (54 MG TOTAL) BY MOUTH DAILY., Disp: 90 tablet, Rfl: 0 .  furosemide (LASIX) 20 MG tablet, Take 3 tablets (60 mg total) by mouth daily., Disp: 90 tablet, Rfl: 0 .  hydrALAZINE (APRESOLINE) 100 MG tablet, Take 1 tablet (100 mg total) by mouth 3 (three) times daily., Disp: 90 tablet, Rfl: 3 .  insulin glargine (LANTUS) 100 UNIT/ML injection, Inject 0.24 mLs (24 Units total) into the skin daily., Disp: 10 mL, Rfl: 11 .  isosorbide mononitrate (IMDUR) 60 MG 24 hr tablet, Take 1 tablet (60 mg total) by mouth daily., Disp: 30 tablet, Rfl: 11 .  Lancets MISC, 1 each by Does not apply route 3 (three) times daily as needed., Disp: 300 each, Rfl: 3 .  latanoprost (XALATAN) 0.005 % ophthalmic solution, Place 1 drop into both eyes at bedtime., Disp: , Rfl:  .  meclizine (ANTIVERT) 25 MG tablet, Take 1 tablet (25 mg total) by mouth 3 (three) times daily as needed for dizziness., Disp: 90 tablet, Rfl: 3 .  pantoprazole (PROTONIX) 40 MG tablet, Take 1 tablet (40 mg total) by mouth 2 (two) times daily., Disp: 180 tablet, Rfl: 3 .  VASCEPA 1 g  capsule, TAKE TWO CAPSULES BY MOUTH TWICE A DAY, Disp: 120 capsule, Rfl: 5 .  omeprazole (PRILOSEC) 20 MG capsule, Take 1 capsule (20 mg total) by mouth daily. (Patient not taking: No sig reported), Disp: 30 capsule, Rfl: 0 Allergies  Allergen Reactions  . Lisinopril Cough      Social History   Socioeconomic History  . Marital status: Legally Separated    Spouse name: Not on file  . Number of children: Not on file  . Years of education: Not on file  . Highest education level: Not on file  Occupational History  . Not on file  Tobacco Use  . Smoking status: Never Smoker  . Smokeless tobacco: Never Used  Vaping Use  . Vaping Use: Never used  Substance and Sexual Activity  . Alcohol use: Yes    Alcohol/week: 1.0 - 2.0 standard drink    Types: 1 - 2 Glasses of wine per week    Comment: rare  . Drug use: No  . Sexual activity: Yes  Other Topics Concern  . Not on file  Social History Narrative  . Not on file   Social Determinants of Health   Financial Resource Strain: Medium Risk  . Difficulty of Paying Living Expenses: Somewhat hard  Food Insecurity: No Food Insecurity  . Worried About Charity fundraiser in the Last Year: Never true  . Ran Out of Food in the Last Year: Never true  Transportation Needs: No Transportation Needs  . Lack of Transportation (Medical): No  . Lack of Transportation (Non-Medical): No  Physical Activity: Not on file  Stress: Not on file  Social Connections: Not on file  Intimate Partner Violence: Not on file    Physical Exam      Future Appointments  Date Time Provider [place]  08/04/2020  8:00 PM Sueanne Margarita, MD [facility]  08/11/2020 11:30 AM [facility] PA/NP [facility] None  09/21/2020  8:35 AM [facility] DEVICE REMOTES [facility]  10/14/2020 10:40 AM Vevelyn Francois, NP [facility] None  12/21/2020  8:35 AM [facility] DEVICE REMOTES [facility]  03/22/2021  8:35 AM [facility] DEVICE REMOTES  [facility]  06/21/2021  8:35 AM [facility] DEVICE REMOTES [facility]  09/20/2021  8:35 AM [facility] DEVICE REMOTES [facility]  12/20/2021  8:35 AM [facility] DEVICE REMOTES [facility]    BP 134/68   Pulse 90   Resp 18   Wt 233 lb (105.7 kg)   SpO2 98%   BMI 33.43 kg/m   Weight yesterday-232 Last visit weight-235  Pt reports he is doing ok, his dizziness has improved with the meclizine, he denies increased sob, no c/p, no h/a.  -needs carvedilol for smaller pill box in the PM dosing. Will p/u that and go out tomor to place where needed.  No edema noted.  meds verified and 2 pill boxes refilled.  Will need fenofibrate, vascepa, and meclizine refilled by next visit.   [name], [facility] Paramedic  07/29/20

## 2020-07-30 ENCOUNTER — Other Ambulatory Visit (HOSPITAL_COMMUNITY): Payer: Self-pay

## 2020-08-04 ENCOUNTER — Encounter (HOSPITAL_BASED_OUTPATIENT_CLINIC_OR_DEPARTMENT_OTHER): Payer: Medicare Other | Admitting: Cardiology

## 2020-08-10 NOTE — H&P (View-Only) (Signed)
Advanced Heart Failure Clinic Note   PCP: Kathe Becton, FNP HF Cardiology: Dr. Aundra Dubin  Mr Chevez is a 50 year old with a history of with deafness, HTN, DM, and chronic systolic HF.   Patient had no known cardiac problems until 10/16.  At that time, he developed exertional dyspnea and was admitted to a hospital in [place], [place] with acute systolic CHF.  Echo 10/16 showed EF 15-20%.  Cardiac cath showed no significant coronary disease.  He was diuresed and started on cardiac meds. Subsequently, he moved to [facility]  Now working in a [employer].  Repeat echo in 5/18 showed persistently low EF, 25-30%.  He had [place] placed.   He was admitted briefly with CHF exacerbation in 8/20.    Echo in 9/20 showed EF 30-35% with diffuse hypokinesis.   Ultrasound in 4/21 showed right leg DVT and CT showed a PE.  No obvious trigger (no long trip, no surgery, no prolonged immobility).  He was started on apixaban.   He quit taking his meds in 8/21 and was drinking heavily due to depression, later restarted meds.  In 10/21, he was admitted with DKA and atrial fibrillation/RVR (1st known episode).  He converted to NSR spontaneously.  [name] was stopped due to AKI.  He had melena/hematochezia and EGD showed gastritis/esophagitis.  Apixaban was stopped.  He went home but then was re-admitted later in 10/21 with bilateral PEs.   Echo in 10/21 showed EF 45%, mild global hypokinesis, moderate LVH, mildly decreased RV systolic function.    On 05/05/20, he had a near-syncopal event and went to the ER, no events on ICD interrogation.  ?Orthostatic.  No further dizziness/lightheadedness. He is working at [employer] in [employer].  Able to do his job without dyspnea, but he gets short of breath after walking about 50 feet.  Sleepy during the day. Sleep study ordered.  Repeat RHC (2/22) showed ptimized filling pressures, mild pulmonary hypertension, preserved cardiac output. Suspect elevated PA pressure  may be related to OSA.    Admitted [id number]  With A/C systolic heart failure. Diuresed with IV lasix and transitioned to lasix 60 mg daily. Discharge weight 232 pounds.   Today he returns for HF follow up.Overall feeling fine. Having ongoing difficult sleeping. Naps during the day. Denies SOB/PND/Orthopnea. Appetite ok. No fever or chills. Weight at home 233 pounds. Taking all medications. Followed by HF Paramedicine. Working at [employer] in [employer].   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 Average heart rate 84  Labs (9/17): BNP 477, K 4.5 => 4.2, creatinine 0.92 => 1.1, BNP 1009, HIV negative, SPEP negative.  Labs (10/17): K 5.2, creatinine 1.10, hgb 12.5 Labs (03/28/2016): K 5.3 Creatinine 1.07  Labs (3/18): digoxin < 0.2, K 4.1, creatinine 0.97 Labs (9/18): hgb 12.2, K 4, creatinine 0.99, BNP 139 Labs (1/19): hgb 11.2, K 4.8, creatinine 1.26 Labs (8/20): K 3.7, creatinine 1.69 Labs (5/21): K 5, creatinine 1.4, LDL 192 Labs (10/21): K 4.1, creatinine 1.77, LDL 180, TGs 138 Labs (11/21): K 4.5, creatinine 2.68 Labs (12/21): K 5.1, creatinine 2.25 Labs (04/30/20): K 4.7, creatinine 2.7 Labs (05/05/20): K 4.0, creatinine 2.15 Labs (05/11/20): K 4.2, creatinine 2.43, LDL 76, HDL 38, TGs 220 Labs (2/22): K 4.4, creatinine 2.69  PMH: 1. HTN 2. Diabetes 3. Hyperlipidemia 4. Deafness since age 34: Needs sign language interpreter.  5. Chronic systolic CHF: Nonischemic cardiomyopathy.  Diagnosis 10/16 in [place], [place] (admitted with acute systolic CHF).  SPEP and  HIV negative.  [place].  - Echo (10/16): EF 15-20%, mild LVH, mild MR, normal RV size and systolic function.  - LHC (10/16): No significant CAD.  - CPX (12/17): Mild functional limitation.  - 04/2016 CMRI: EF 27%. RV normal. No evidence of infiltrative disease, myocarditis, or MI.  - Echo (5/18): EF 25-30%, mild LV dilation, mild LVH, normal RV size with mildly decreased - Echo (9/20): EF 30-35%, mild  LVH, diffuse hypokinesis, normal RV size and systolic function.  - Echo (10/21): EF 45%, mild global hypokinesis, moderate LVH, mildly decreased RV systolic function.  - RHC (2/22): Optimized filling pressures, mild pulmonary hypertension, preserved cardiac output. Continue current diuretics.  Suspect elevated PA pressure may be related to OSA.   6. Depression. 7. Colitis episodes.  8. CKD stage 3.  9. DVT/PE in 4/21: No obvious trigger.  - Recurrent PE in 10/21, had been off anticoagulation.  10. Atrial fibrillation: Paroxysmal.  11. Gastritis/esophagitis: 10/21.  12. ETOH abuse  SH: From [place] originally.  Lives in [place] until 2017, then moved to [place].  Nonsmoker.  Unemployed.  H/o ETOH abuse.    FH: No known cardiac disease.   ROS: All systems reviewed and negative except as per HPI.   Current Outpatient Medications  Medication Sig Dispense Refill  . amiodarone (PACERONE) 200 MG tablet Take 0.5 tablets (100 mg total) by mouth daily. 60 tablet 2  . atorvastatin (LIPITOR) 80 MG tablet Take 1 tablet (80 mg total) by mouth at bedtime. 30 tablet 11  . carvedilol (COREG) 25 MG tablet Take 1 tablet (25 mg total) by mouth 2 (two) times daily with a meal. 60 tablet 6  . dapagliflozin propanediol (FARXIGA) 10 MG TABS tablet Take 1 tablet (10 mg total) by mouth daily before breakfast. 30 tablet 11  . dicyclomine (BENTYL) 20 MG tablet Take 1 tablet (20 mg total) by mouth 2 (two) times daily. 20 tablet 0  . ELIQUIS 5 MG TABS tablet TAKE 1 TABLET (5 MG TOTAL) BY MOUTH 2 (TWO) TIMES DAILY. 180 tablet 2  . fenofibrate 54 MG tablet TAKE 1 TABLET (54 MG TOTAL) BY MOUTH DAILY. 90 tablet 0  . furosemide (LASIX) 20 MG tablet Take 3 tablets (60 mg total) by mouth daily. 90 tablet 0  . hydrALAZINE (APRESOLINE) 100 MG tablet Take 1 tablet (100 mg total) by mouth 3 (three) times daily. 90 tablet 3  . insulin glargine (LANTUS) 100 UNIT/ML injection Inject 0.24 mLs (24 Units total) into the skin daily. 10  mL 11  . isosorbide mononitrate (IMDUR) 60 MG 24 hr tablet Take 1 tablet (60 mg total) by mouth daily. 30 tablet 11  . Lancets MISC 1 each by Does not apply route 3 (three) times daily as needed. 300 each 3  . latanoprost (XALATAN) 0.005 % ophthalmic solution Place 1 drop into both eyes at bedtime.    . meclizine (ANTIVERT) 25 MG tablet Take 1 tablet (25 mg total) by mouth 3 (three) times daily as needed for dizziness. 90 tablet 3  . pantoprazole (PROTONIX) 40 MG tablet Take 1 tablet (40 mg total) by mouth 2 (two) times daily. 180 tablet 3  . VASCEPA 1 g capsule TAKE TWO CAPSULES BY MOUTH TWICE A DAY 120 capsule 5   No current facility-administered medications for this encounter.   BP 104/70   Pulse 88   Wt 103.5 kg (228 lb 3.2 oz)   SpO2 96%   BMI 32.74 kg/m    Wt Readings  from Last 3 Encounters:  08/11/20 103.5 kg (228 lb 3.2 oz)  07/29/20 105.7 kg (233 lb)  07/22/20 106.6 kg (235 lb)    PHYSICAL EXAM: General:   No resp difficulty HEENT: normal Neck: supple. no JVD. Carotids 2+ bilat; no bruits. No lymphadenopathy or thryomegaly appreciated. Cor: PMI nondisplaced. Regular rate & rhythm. No rubs, gallops or murmurs. Lungs: clear Abdomen: soft, nontender, nondistended. No hepatosplenomegaly. No bruits or masses. Good bowel sounds. Extremities: no cyanosis, clubbing, rash, edema Neuro: alert & orientedx3, cranial nerves grossly intact. moves all 4 extremities w/o difficulty. Affect pleasant  Assessment/Plan: 1. Chronic systolic CHF: Nonischemic cardiomyopathy, etiology uncertain. Consider prior ETOH or HTN.  He has a [place].  cMRI in 1/18 with EF 27%, RV normal, no evidence of infiltrative disease, myocarditis, or MI. Echo in 9/20 showed EF 30-35%.  Echo in 10/21 showed EF up to 45% with mild RV dysfunction.  Repeat RHC (2/22) with optimized filling pressures, preserved CO and moderate pulmonary hypertension-->likely due to untreated sleep apnea. 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 - Continue lasix 60 mg daily.  - Continue Farxiga 10 mg daily.  - Continue Coreg 25 mg bid.      - Continue hydralazine to 100 mg tid + Imdur 60 mg daily. - Off Entresto and spironolactone with elevated creatinine.  - Check BMET today.  2. Type II diabetes: management per PCP. Continue Lantus. He does not check his blood sugars at home. - Continue Farxiga 10 mg daily 3. HTN: Stable  4. Deafness: Sign language interpreter present at visit.  5. CKD: Stage 3.  - Check BMET  - Continue [place] for now.  - Referred to nephrology.  6. PE/DVT: 4/21.  Unclear cause, no prolonged trip, prolonged immobility, or history of malignancy. No FH clotting. Recurrent PE in 10/21 while Eliquis on hold with bleeding from esophagitis/gastritis.  Do no think this was an Eliquis failure.  - Continue apixaban, think he will need long-term with no clear trigger for VTE. No bleeding issues.  7. Hyperlipidemia: Continue atorvastatin, good lipids 1/22.  8. Atrial fibrillation: First noted in setting of DKA in 10/21.  Has been on amiodarone and is regular on exam today.  - Stop amiodarone. If has recurrence will refer to EP for ablation.  -Regular on exam.  - Continue Eliquis. No bleeding. 9. ETOH abuse: ETOH may have played a role in both cardiomyopathy and atrial fibrillation.  No longer drinking alcohol.  10. Severe OSA: recent sleep study (AHI = 125.7/h). Followed by Dr. Radford Pax. Plan for CPAP titration tonight.  11. Noncompliance:  Doing much better with meds. Continue HF Paramedicine .    Contacted the device clinic to follow monthly. Discussed med changes with HF Paramedic.   Darrick Grinder, NP-BC 08/11/2020

## 2020-08-10 NOTE — Progress Notes (Signed)
Came out for med rec to place carvedilol in pill box after pharmacy p/u.   [name], EMT-Paramedic  07/30/2020

## 2020-08-10 NOTE — Progress Notes (Signed)
Advanced Heart Failure Clinic Note   PCP: Kathe Becton, FNP HF Cardiology: Dr. Aundra Dubin  Troy Rush is a 50 year old with a history of with deafness, HTN, DM, and chronic systolic HF.   Patient had no known cardiac problems until 10/16.  At that time, he developed exertional dyspnea and was admitted to a hospital in [place], [place] with acute systolic CHF.  Echo 10/16 showed EF 15-20%.  Cardiac cath showed no significant coronary disease.  He was diuresed and started on cardiac meds. Subsequently, he moved to [facility].  Now working in a [employer].  Repeat echo in 5/18 showed persistently low EF, 25-30%.  He had [place] placed.   He was admitted briefly with CHF exacerbation in 8/20.    Echo in 9/20 showed EF 30-35% with diffuse hypokinesis.   Ultrasound in 4/21 showed right leg DVT and CT showed a PE.  No obvious trigger (no long trip, no surgery, no prolonged immobility).  He was started on apixaban.   He quit taking his meds in 8/21 and was drinking heavily due to depression, later restarted meds.  In 10/21, he was admitted with DKA and atrial fibrillation/RVR (1st known episode).  He converted to NSR spontaneously.  Troy Rush was stopped due to AKI.  He had melena/hematochezia and EGD showed gastritis/esophagitis.  Apixaban was stopped.  He went home but then was re-admitted later in 10/21 with bilateral PEs.   Echo in 10/21 showed EF 45%, mild global hypokinesis, moderate LVH, mildly decreased RV systolic function.    On 05/05/20, he had a near-syncopal event and went to the ER, no events on ICD interrogation.  ?Orthostatic.  No further dizziness/lightheadedness. He is working at [employer] in [employer].  Able to do his job without dyspnea, but he gets short of breath after walking about 50 feet.  Sleepy during the day. Sleep study ordered.  Repeat RHC (2/22) showed ptimized filling pressures, mild pulmonary hypertension, preserved cardiac output. Suspect elevated PA pressure  may be related to OSA.    Admitted [id number]  With A/C systolic heart failure. Diuresed with IV lasix and transitioned to lasix 60 mg daily. Discharge weight 232 pounds.   Today he returns for HF follow up.Overall feeling fine. Having ongoing difficult sleeping. Naps during the day. Denies SOB/PND/Orthopnea. Appetite ok. No fever or chills. Weight at home 233 pounds. Taking all medications. Followed by HF Paramedicine. Working at [employer] in [employer].   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 Average heart rate 84  Labs (9/17): BNP 477, K 4.5 => 4.2, creatinine 0.92 => 1.1, BNP 1009, HIV negative, SPEP negative.  Labs (10/17): K 5.2, creatinine 1.10, hgb 12.5 Labs (03/28/2016): K 5.3 Creatinine 1.07  Labs (3/18): digoxin < 0.2, K 4.1, creatinine 0.97 Labs (9/18): hgb 12.2, K 4, creatinine 0.99, BNP 139 Labs (1/19): hgb 11.2, K 4.8, creatinine 1.26 Labs (8/20): K 3.7, creatinine 1.69 Labs (5/21): K 5, creatinine 1.4, LDL 192 Labs (10/21): K 4.1, creatinine 1.77, LDL 180, TGs 138 Labs (11/21): K 4.5, creatinine 2.68 Labs (12/21): K 5.1, creatinine 2.25 Labs (04/30/20): K 4.7, creatinine 2.7 Labs (05/05/20): K 4.0, creatinine 2.15 Labs (05/11/20): K 4.2, creatinine 2.43, LDL 76, HDL 38, TGs 220 Labs (2/22): K 4.4, creatinine 2.69  PMH: 1. HTN 2. Diabetes 3. Hyperlipidemia 4. Deafness since age 73: Needs sign language interpreter.  5. Chronic systolic CHF: Nonischemic cardiomyopathy.  Diagnosis 10/16 in [place], [place] (admitted with acute systolic CHF).  SPEP and  HIV negative.  [place].  - Echo (10/16): EF 15-20%, mild LVH, mild Troy, normal RV size and systolic function.  - LHC (10/16): No significant CAD.  - CPX (12/17): Mild functional limitation.  - 04/2016 CMRI: EF 27%. RV normal. No evidence of infiltrative disease, myocarditis, or MI.  - Echo (5/18): EF 25-30%, mild LV dilation, mild LVH, normal RV size with mildly decreased - Echo (9/20): EF 30-35%, mild  LVH, diffuse hypokinesis, normal RV size and systolic function.  - Echo (10/21): EF 45%, mild global hypokinesis, moderate LVH, mildly decreased RV systolic function.  - RHC (2/22): Optimized filling pressures, mild pulmonary hypertension, preserved cardiac output. Continue current diuretics.  Suspect elevated PA pressure may be related to OSA.   6. Depression. 7. Colitis episodes.  8. CKD stage 3.  9. DVT/PE in 4/21: No obvious trigger.  - Recurrent PE in 10/21, had been off anticoagulation.  10. Atrial fibrillation: Paroxysmal.  11. Gastritis/esophagitis: 10/21.  12. ETOH abuse  SH: From [place] originally.  Lives in [place] until 2017, then moved to [place].  Nonsmoker.  Unemployed.  H/o ETOH abuse.    FH: No known cardiac disease.   ROS: All systems reviewed and negative except as per HPI.   Current Outpatient Medications  Medication Sig Dispense Refill  . amiodarone (PACERONE) 200 MG tablet Take 0.5 tablets (100 mg total) by mouth daily. 60 tablet 2  . atorvastatin (LIPITOR) 80 MG tablet Take 1 tablet (80 mg total) by mouth at bedtime. 30 tablet 11  . carvedilol (COREG) 25 MG tablet Take 1 tablet (25 mg total) by mouth 2 (two) times daily with a meal. 60 tablet 6  . dapagliflozin propanediol (FARXIGA) 10 MG TABS tablet Take 1 tablet (10 mg total) by mouth daily before breakfast. 30 tablet 11  . dicyclomine (BENTYL) 20 MG tablet Take 1 tablet (20 mg total) by mouth 2 (two) times daily. 20 tablet 0  . ELIQUIS 5 MG TABS tablet TAKE 1 TABLET (5 MG TOTAL) BY MOUTH 2 (TWO) TIMES DAILY. 180 tablet 2  . fenofibrate 54 MG tablet TAKE 1 TABLET (54 MG TOTAL) BY MOUTH DAILY. 90 tablet 0  . furosemide (LASIX) 20 MG tablet Take 3 tablets (60 mg total) by mouth daily. 90 tablet 0  . hydrALAZINE (APRESOLINE) 100 MG tablet Take 1 tablet (100 mg total) by mouth 3 (three) times daily. 90 tablet 3  . insulin glargine (LANTUS) 100 UNIT/ML injection Inject 0.24 mLs (24 Units total) into the skin daily. 10  mL 11  . isosorbide mononitrate (IMDUR) 60 MG 24 hr tablet Take 1 tablet (60 mg total) by mouth daily. 30 tablet 11  . Lancets MISC 1 each by Does not apply route 3 (three) times daily as needed. 300 each 3  . latanoprost (XALATAN) 0.005 % ophthalmic solution Place 1 drop into both eyes at bedtime.    . meclizine (ANTIVERT) 25 MG tablet Take 1 tablet (25 mg total) by mouth 3 (three) times daily as needed for dizziness. 90 tablet 3  . pantoprazole (PROTONIX) 40 MG tablet Take 1 tablet (40 mg total) by mouth 2 (two) times daily. 180 tablet 3  . VASCEPA 1 g capsule TAKE TWO CAPSULES BY MOUTH TWICE A DAY 120 capsule 5   No current facility-administered medications for this encounter.   BP 104/70   Pulse 88   Wt 103.5 kg (228 lb 3.2 oz)   SpO2 96%   BMI 32.74 kg/m    Wt Readings  from Last 3 Encounters:  08/11/20 103.5 kg (228 lb 3.2 oz)  07/29/20 105.7 kg (233 lb)  07/22/20 106.6 kg (235 lb)    PHYSICAL EXAM: General:   No resp difficulty HEENT: normal Neck: supple. no JVD. Carotids 2+ bilat; no bruits. No lymphadenopathy or thryomegaly appreciated. Cor: PMI nondisplaced. Regular rate & rhythm. No rubs, gallops or murmurs. Lungs: clear Abdomen: soft, nontender, nondistended. No hepatosplenomegaly. No bruits or masses. Good bowel sounds. Extremities: no cyanosis, clubbing, rash, edema Neuro: alert & orientedx3, cranial nerves grossly intact. moves all 4 extremities w/o difficulty. Affect pleasant  Assessment/Plan: 1. Chronic systolic CHF: Nonischemic cardiomyopathy, etiology uncertain. Consider prior ETOH or HTN.  He has a [place].  cMRI in 1/18 with EF 27%, RV normal, no evidence of infiltrative disease, myocarditis, or MI. Echo in 9/20 showed EF 30-35%.  Echo in 10/21 showed EF up to 45% with mild RV dysfunction.  Repeat RHC (2/22) with optimized filling pressures, preserved CO and moderate pulmonary hypertension-->likely due to untreated sleep apnea. 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 - Continue lasix 60 mg daily.  - Continue Farxiga 10 mg daily.  - Continue Coreg 25 mg bid.      - Continue hydralazine to 100 mg tid + Imdur 60 mg daily. - Off Entresto and spironolactone with elevated creatinine.  - Check BMET today.  2. Type II diabetes: management per PCP. Continue Lantus. He does not check his blood sugars at home. - Continue Farxiga 10 mg daily 3. HTN: Stable  4. Deafness: Sign language interpreter present at visit.  5. CKD: Stage 3.  - Check BMET  - Continue [place] for now.  - Referred to nephrology.  6. PE/DVT: 4/21.  Unclear cause, no prolonged trip, prolonged immobility, or history of malignancy. No FH clotting. Recurrent PE in 10/21 while Eliquis on hold with bleeding from esophagitis/gastritis.  Do no think this was an Eliquis failure.  - Continue apixaban, think he will need long-term with no clear trigger for VTE. No bleeding issues.  7. Hyperlipidemia: Continue atorvastatin, good lipids 1/22.  8. Atrial fibrillation: First noted in setting of DKA in 10/21.  Has been on amiodarone and is regular on exam today.  - Stop amiodarone. If has recurrence will refer to EP for ablation.  -Regular on exam.  - Continue Eliquis. No bleeding. 9. ETOH abuse: ETOH may have played a role in both cardiomyopathy and atrial fibrillation.  No longer drinking alcohol.  10. Severe OSA: recent sleep study (AHI = 125.7/h). Followed by Dr. Radford Pax. Plan for CPAP titration tonight.  11. Noncompliance:  Doing much better with meds. Continue HF Paramedicine .    Contacted the device clinic to follow monthly. Discussed med changes with HF Paramedic.   Darrick Grinder, NP-BC 08/11/2020

## 2020-08-11 ENCOUNTER — Encounter (HOSPITAL_COMMUNITY): Payer: Self-pay

## 2020-08-11 ENCOUNTER — Ambulatory Visit (HOSPITAL_COMMUNITY)
Admission: RE | Admit: 2020-08-11 | Discharge: 2020-08-11 | Disposition: A | Discharge status: Home / Self Care | Payer: Medicare Other | Source: Ambulatory Visit | Attending: Adult Health | Admitting: Adult Health

## 2020-08-11 ENCOUNTER — Other Ambulatory Visit (HOSPITAL_COMMUNITY): Payer: Self-pay

## 2020-08-11 VITALS — BP 104/70 | HR 88 | Wt 228.2 lb

## 2020-08-11 DIAGNOSIS — I480 Paroxysmal atrial fibrillation: Secondary | ICD-10-CM | POA: Diagnosis not present

## 2020-08-11 DIAGNOSIS — I428 Other cardiomyopathies: Secondary | ICD-10-CM | POA: Insufficient documentation

## 2020-08-11 DIAGNOSIS — Z7901 Long term (current) use of anticoagulants: Secondary | ICD-10-CM | POA: Diagnosis not present

## 2020-08-11 DIAGNOSIS — Z95810 Presence of automatic (implantable) cardiac defibrillator: Secondary | ICD-10-CM | POA: Diagnosis not present

## 2020-08-11 DIAGNOSIS — I130 Hypertensive heart and chronic kidney disease with heart failure and stage 1 through stage 4 chronic kidney disease, or unspecified chronic kidney disease: Secondary | ICD-10-CM | POA: Insufficient documentation

## 2020-08-11 DIAGNOSIS — Z8719 Personal history of other diseases of the digestive system: Secondary | ICD-10-CM | POA: Insufficient documentation

## 2020-08-11 DIAGNOSIS — E785 Hyperlipidemia, unspecified: Secondary | ICD-10-CM | POA: Insufficient documentation

## 2020-08-11 DIAGNOSIS — G4733 Obstructive sleep apnea (adult) (pediatric): Secondary | ICD-10-CM | POA: Diagnosis not present

## 2020-08-11 DIAGNOSIS — H9193 Unspecified hearing loss, bilateral: Secondary | ICD-10-CM

## 2020-08-11 DIAGNOSIS — Z9114 Patient's other noncompliance with medication regimen: Secondary | ICD-10-CM | POA: Insufficient documentation

## 2020-08-11 DIAGNOSIS — I10 Essential (primary) hypertension: Secondary | ICD-10-CM | POA: Diagnosis not present

## 2020-08-11 DIAGNOSIS — H9190 Unspecified hearing loss, unspecified ear: Secondary | ICD-10-CM | POA: Insufficient documentation

## 2020-08-11 DIAGNOSIS — I5042 Chronic combined systolic (congestive) and diastolic (congestive) heart failure: Secondary | ICD-10-CM | POA: Diagnosis not present

## 2020-08-11 DIAGNOSIS — Z79899 Other long term (current) drug therapy: Secondary | ICD-10-CM | POA: Insufficient documentation

## 2020-08-11 DIAGNOSIS — F1010 Alcohol abuse, uncomplicated: Secondary | ICD-10-CM | POA: Diagnosis not present

## 2020-08-11 DIAGNOSIS — Z86711 Personal history of pulmonary embolism: Secondary | ICD-10-CM | POA: Insufficient documentation

## 2020-08-11 DIAGNOSIS — E1122 Type 2 diabetes mellitus with diabetic chronic kidney disease: Secondary | ICD-10-CM | POA: Diagnosis not present

## 2020-08-11 DIAGNOSIS — N183 Chronic kidney disease, stage 3 unspecified: Secondary | ICD-10-CM | POA: Insufficient documentation

## 2020-08-11 DIAGNOSIS — N1832 Chronic kidney disease, stage 3b: Secondary | ICD-10-CM

## 2020-08-11 DIAGNOSIS — Z794 Long term (current) use of insulin: Secondary | ICD-10-CM | POA: Diagnosis not present

## 2020-08-11 LAB — BASIC METABOLIC PANEL
Anion gap: 9 (ref 5–15)
BUN: 31 mg/dL — ABNORMAL HIGH (ref 6–20)
CO2: 25 mmol/L (ref 22–32)
Calcium: 9.3 mg/dL (ref 8.9–10.3)
Chloride: 98 mmol/L (ref 98–111)
Creatinine, Ser: 2.97 mg/dL — ABNORMAL HIGH (ref 0.61–1.24)
GFR, Estimated: 25 mL/min — ABNORMAL LOW (ref >60)
Glucose, Bld: 231 mg/dL — ABNORMAL HIGH (ref 70–99)
Potassium: 4.4 mmol/L (ref 3.5–5.1)
Sodium: 132 mmol/L — ABNORMAL LOW (ref 135–145)

## 2020-08-11 NOTE — Patient Instructions (Addendum)
Labs today--we will call with any abnormals.  STOP taking your Amiodarone.  If you have any questions or concerns before your next appointment please send [place] a message through [place] or call our office at [phone].    TO LEAVE A MESSAGE FOR THE NURSE SELECT OPTION 2, PLEASE LEAVE A MESSAGE INCLUDING: . YOUR NAME . DATE OF BIRTH . CALL BACK NUMBER . REASON FOR CALL**this is important as we prioritize the call backs  YOU WILL RECEIVE A CALL BACK THE SAME DAY AS LONG AS YOU CALL BEFORE 4:00 PM

## 2020-08-11 NOTE — Progress Notes (Signed)
Paramedicine Encounter   Patient ID: Troy Rush , male,   DOB: Jun 02, 1970,49 y.o.,  MRN: [id number]   Met patient in clinic today with provider.  Weight @ clinic-228 B/p-104/70 sp02- p-88  Pt reports he feels ok.  He has sleep study tomor with CPAP.  Home bp cuff-130/75?  Dizziness more at nighttime.  He has not been checking his CBG's.  He does not have the needle pen to check his blood sugars.  Will see if we can get another one.  He was referred to [facility] but he has not heard from them. Will f/u with that. Also amy will f/u with his remote transmissions and it not being done.  F/u in 3 months.  --amio is being stopped.   [phone] needs to be updated to cone txp for future rides for texting--that was done.   [name], [place] 08/11/2020

## 2020-08-12 ENCOUNTER — Other Ambulatory Visit: Payer: Self-pay

## 2020-08-12 ENCOUNTER — Other Ambulatory Visit (HOSPITAL_COMMUNITY): Payer: Self-pay | Admitting: Family Medicine

## 2020-08-12 ENCOUNTER — Ambulatory Visit (HOSPITAL_BASED_OUTPATIENT_CLINIC_OR_DEPARTMENT_OTHER): Payer: Medicare Other | Attending: Cardiology | Admitting: Cardiology

## 2020-08-12 ENCOUNTER — Other Ambulatory Visit (HOSPITAL_COMMUNITY): Payer: Self-pay

## 2020-08-12 DIAGNOSIS — G4733 Obstructive sleep apnea (adult) (pediatric): Secondary | ICD-10-CM | POA: Diagnosis not present

## 2020-08-12 NOTE — Progress Notes (Addendum)
Came out for med rec-amio is being stopped.   Needs isosorbide for next visit.   The larger pill box is out of the meclizine-it is getting delivered today.  I advised pt he will need to place in pill box. He agreed to that.  Will see him in 2 wks.  Not planning on trip out of country-he has to get his passport renewed-maybe christmas time.   [name], EMT-Paramedic  08/12/2020

## 2020-08-15 NOTE — Procedures (Signed)
   Patient Name: [name], Troy Rush Date: 08/12/2020 Gender: Male D.O.B: 01/20/1971 Age (years): 53 Referring Provider: Allena Katz FNP Height (inches): 70 Interpreting Physician: Fransico Him MD, ABSM Weight (lbs): 240 RPSGT: Laren Everts BMI: 34 MRN: [id number] Neck Size: 18.00  CLINICAL INFORMATION The patient is referred for a CPAP titration to treat sleep apnea.  SLEEP STUDY TECHNIQUE As per the AASM Manual for the Scoring of Sleep and Associated Events v2.3 (April 2016) with a hypopnea requiring 4% desaturations.  The channels recorded and monitored were frontal, central and occipital EEG, electrooculogram (EOG), submentalis EMG (chin), nasal and oral airflow, thoracic and abdominal wall motion, anterior tibialis EMG, snore microphone, electrocardiogram, and pulse oximetry. Continuous positive airway pressure (CPAP) was initiated at the beginning of the study and titrated to treat sleep-disordered breathing.  MEDICATIONS Medications self-administered by patient taken the night of the study : N/A  TECHNICIAN COMMENTS Comments added by technician: NONE Comments added by scorer: N/A  RESPIRATORY PARAMETERS Optimal PAP Pressure (cm): 17 AHI at Optimal Pressure (/hr):1.5 Overall Minimal O2 (%):89.0  Supine % at Optimal Pressure (%):17 Minimal O2 at Optimal Pressure (%): 91.0   SLEEP ARCHITECTURE The study was initiated at 9:23:47 PM and ended at 4:58:39 AM.  Sleep onset time was 0.8 minutes and the sleep efficiency was 89.8%. The total sleep time was 408.5 minutes.  The patient spent 7.6% of the night in stage N1 sleep, 76.0% in stage N2 sleep, 0.0% in stage N3 and 16.4% in REM.Stage REM latency was 47.0 minutes  Wake after sleep onset was 45.6. Alpha intrusion was absent. Supine sleep was 82.50%.  CARDIAC DATA The 2 lead EKG demonstrated sinus rhythm. The mean heart rate was 69.9 beats per minute. Other EKG findings include: None.  LEG MOVEMENT DATA The total  Periodic Limb Movements of Sleep (PLMS) were 0. The PLMS index was 0.0. A PLMS index of <15 is considered normal in adults.  IMPRESSIONS - The optimal PAP pressure was 17 cm of water. - Central sleep apnea was not noted during this titration (CAI = 0.4/h). - Mild oxygen desaturations were observed during this titration (min O2 = 89.0%). - The patient snored with loud snoring volume during this titration study. - No cardiac abnormalities were observed during this study. - Clinically significant periodic limb movements were not noted during this study. Arousals associated with PLMs were rare.  DIAGNOSIS - Obstructive Sleep Apnea (G47.33)  RECOMMENDATIONS - Trial of CPAP therapy on 17cm H2O with a Medium size Fisher&Paykel Full Face Mask F&P Vitera (new) mask and heated humidification. - Avoid alcohol, sedatives and other CNS depressants that may worsen sleep apnea and disrupt normal sleep architecture. - Sleep hygiene should be reviewed to assess factors that may improve sleep quality. - Weight management and regular exercise should be initiated or continued. - Return to [facility] Center for re-evaluation after 6 weeks of therapy  [Electronically signed] 08/15/2020 06:46 PM  Fransico Him MD, ABSM Diplomate, American Board of Sleep Medicine

## 2020-08-17 ENCOUNTER — Telehealth: Payer: Self-pay | Admitting: *Deleted

## 2020-08-17 NOTE — Telephone Encounter (Signed)
The patient has been notified of the result and verbalized understanding.  All questions (if any) were answered.  Upon patient request DME selection is [facility] Patient understands he will be contacted by [facility] to set up his cpap. Patient understands to call if [facility] does not contact him with new setup in a timely manner. Patient understands they will be called once confirmation has been received from [facility] that they have received their new machine to schedule 10 week follow up appointment.   [facility] notified of new cpap order  Please add to airview Patient was grateful for the call and thanked me

## 2020-08-17 NOTE — Telephone Encounter (Signed)
-----   Message from Sueanne Margarita, MD sent at 08/15/2020  6:49 PM EDT ----- Please let patient know that they had a successful PAP titration and let DME know that orders are in EPIC.  Please set up 6 week OV with me.

## 2020-08-27 ENCOUNTER — Other Ambulatory Visit (HOSPITAL_COMMUNITY): Payer: Self-pay

## 2020-08-27 NOTE — Progress Notes (Signed)
Paramedicine Encounter    Patient ID: Troy Rush, male    DOB: [birth year], 50 y.o.   MRN: [id number]   Patient Care Team: [name], FNP (Inactive) as PCP - General (Family Medicine) Larey Dresser, MD as PCP - Cardiology (Cardiology) Conrad Oakesdale, NP as Nurse Practitioner (Cardiology) Larey Dresser, MD as Consulting Physician (Cardiology) Jorge Ny, LCSW as Social Worker (Licensed Clinical Social Worker)  Patient Active Problem List   Diagnosis Date Noted  . CKD (chronic kidney disease) stage 3, GFR 30-59 ml/min (HCC) 07/08/2020  . Acute CHF (congestive heart failure) ([place]) 07/07/2020  . CKD (chronic kidney disease), stage IV ([place]) 07/07/2020  . ICD (implantable cardioverter-defibrillator) in place 05/14/2020  . Pulmonary embolism ([place]) 02/05/2020  . Acute kidney injury superimposed on CKD ([place])   . Chronic combined systolic and diastolic heart failure ([place])   . Atrial fibrillation with RVR ([place]) 02/01/2020  . NSVT (nonsustained ventricular tachycardia) ([place]) 02/01/2020  . Acute GI bleeding 02/01/2020  . DKA (diabetic ketoacidosis) ([place]) 02/01/2020  . Bilateral deafness 08/15/2019  . Type 2 diabetes mellitus with complication, with long-term current use of insulin ([place]) 08/15/2019  . Hemoglobin A1C between 7% and 9% indicating borderline diabetic control 08/15/2019  . Insomnia 08/15/2019  . Abdominal pain 11/14/2018  . Ischemic cardiomyopathy 10/14/2016  . NICM (nonischemic cardiomyopathy) ([place]) 09/15/2016  . Chronic systolic CHF (congestive heart failure) ([place]) 05/30/2016  . HTN (hypertension) 05/30/2016  . Deaf 05/30/2016  . Hyperkalemia 05/30/2016  . Diabetes ([place]) 03/21/2016  . Major depressive disorder, recurrent episode ([place]) 03/21/2016  . Bronchitis 01/21/2015  . Dyslipidemia 01/21/2015  . Vitamin D deficiency 12/19/2011  . Personal history of other infectious and parasitic diseases 10/25/2011  . Anxiety disorder 05/12/2011  . Hyperlipidemia  05/12/2011  . Hypertriglyceridemia 05/12/2011  . Obstructive sleep apnea 05/12/2011    Current Outpatient Medications:  .  atorvastatin (LIPITOR) 80 MG tablet, Take 1 tablet (80 mg total) by mouth at bedtime., Disp: 30 tablet, Rfl: 11 .  carvedilol (COREG) 25 MG tablet, Take 1 tablet (25 mg total) by mouth 2 (two) times daily with a meal., Disp: 60 tablet, Rfl: 6 .  dapagliflozin propanediol (FARXIGA) 10 MG TABS tablet, Take 1 tablet (10 mg total) by mouth daily before breakfast., Disp: 30 tablet, Rfl: 11 .  ELIQUIS 5 MG TABS tablet, TAKE 1 TABLET (5 MG TOTAL) BY MOUTH 2 (TWO) TIMES DAILY., Disp: 180 tablet, Rfl: 2 .  fenofibrate 54 MG tablet, TAKE 1 TABLET (54 MG TOTAL) BY MOUTH DAILY., Disp: 90 tablet, Rfl: 0 .  hydrALAZINE (APRESOLINE) 100 MG tablet, TAKE 1 TABLET (100 MG TOTAL) BY MOUTH 3 (THREE) TIMES DAILY., Disp: 90 tablet, Rfl: 3 .  insulin glargine (LANTUS) 100 UNIT/ML injection, Inject 0.24 mLs (24 Units total) into the skin daily., Disp: 10 mL, Rfl: 11 .  isosorbide mononitrate (IMDUR) 60 MG 24 hr tablet, Take 1 tablet (60 mg total) by mouth daily., Disp: 30 tablet, Rfl: 11 .  Lancets MISC, 1 each by Does not apply route 3 (three) times daily as needed., Disp: 300 each, Rfl: 3 .  latanoprost (XALATAN) 0.005 % ophthalmic solution, Place 1 drop into both eyes at bedtime., Disp: , Rfl:  .  meclizine (ANTIVERT) 25 MG tablet, Take 1 tablet (25 mg total) by mouth 3 (three) times daily as needed for dizziness., Disp: 90 tablet, Rfl: 3 .  pantoprazole (PROTONIX) 40 MG tablet, Take 1 tablet (40 mg total) by mouth 2 (two)  times daily., Disp: 180 tablet, Rfl: 3 .  VASCEPA 1 g capsule, TAKE TWO CAPSULES BY MOUTH TWICE A DAY, Disp: 120 capsule, Rfl: 5 .  dicyclomine (BENTYL) 20 MG tablet, Take 1 tablet (20 mg total) by mouth 2 (two) times daily. (Patient not taking: Reported on 08/27/2020), Disp: 20 tablet, Rfl: 0 .  furosemide (LASIX) 20 MG tablet, Take 3 tablets (60 mg total) by mouth daily.,  Disp: 90 tablet, Rfl: 0 Allergies  Allergen Reactions  . Lisinopril Cough      Social History   Socioeconomic History  . Marital status: Legally Separated    Spouse name: Not on file  . Number of children: Not on file  . Years of education: Not on file  . Highest education level: Not on file  Occupational History  . Not on file  Tobacco Use  . Smoking status: Never Smoker  . Smokeless tobacco: Never Used  Vaping Use  . Vaping Use: Never used  Substance and Sexual Activity  . Alcohol use: Yes    Alcohol/week: 1.0 - 2.0 standard drink    Types: 1 - 2 Glasses of wine per week    Comment: rare  . Drug use: No  . Sexual activity: Yes  Other Topics Concern  . Not on file  Social History Narrative  . Not on file   Social Determinants of Health   Financial Resource Strain: Medium Risk  . Difficulty of Paying Living Expenses: Somewhat hard  Food Insecurity: No Food Insecurity  . Worried About Charity fundraiser in the Last Year: Never true  . Ran Out of Food in the Last Year: Never true  Transportation Needs: No Transportation Needs  . Lack of Transportation (Medical): No  . Lack of Transportation (Non-Medical): No  Physical Activity: Not on file  Stress: Not on file  Social Connections: Not on file  Intimate Partner Violence: Not on file    Physical Exam      Future Appointments  Date Time Provider [place]  09/21/2020  8:35 AM [facility] DEVICE REMOTES [facility]  10/14/2020 10:40 AM Vevelyn Francois, NP [facility] None  11/12/2020 11:20 AM Larey Dresser, MD [facility] None  12/21/2020  8:35 AM [facility] DEVICE REMOTES [facility]  03/22/2021  8:35 AM [facility] DEVICE REMOTES [facility]  06/21/2021  8:35 AM [facility] DEVICE REMOTES [facility]  09/20/2021  8:35 AM [facility] DEVICE REMOTES [facility]  12/20/2021  8:35 AM [facility] DEVICE REMOTES [facility]    BP  130/90   Pulse 80   Resp 18   Wt 230 lb (104.3 kg)   SpO2 97%   BMI 33.00 kg/m   Weight yesterday-? Last visit weight-228 @ clinic  Pt reports doing ok. He denies increased sob, no increased dizziness, no c/p.  No missed doses of his meds.  meds verified and 2 pill boxes refilled.  I p/u vascepa, hydralazine, meclizine, and fenofibrate for him. He does need the isosorbide filled as well. He has enough for 1 week. Ordered that.   -for next visit- he needs carvedilol, farxiga,     [name], [place] Paramedic  08/27/20

## 2020-08-28 ENCOUNTER — Telehealth (HOSPITAL_COMMUNITY): Payer: Self-pay | Admitting: Cardiology

## 2020-08-28 DIAGNOSIS — I5042 Chronic combined systolic (congestive) and diastolic (congestive) heart failure: Secondary | ICD-10-CM

## 2020-08-28 NOTE — Telephone Encounter (Signed)
Troy Rush with para medicine to assist with mems implant prep And review instructions below Letter mailed to patient    [place] [id number] [place] [address] Dept: [phone] Loc: [place]  08/28/2020  You are scheduled for a Cardiac Catheterization on Thursday, June 2 with Dr. Loralie Champagne.  1. Please arrive at the [facility] (Main Entrance A) at [facility]: [address] [place], [place] [address] at 8:30 AM (This time is two hours before your procedure to ensure your preparation). Free valet parking service is available.   Special note: Every effort is made to have your procedure done on time. Please understand that emergencies sometimes delay scheduled procedures.  2. Diet: Do not eat solid foods after midnight.  The patient may have clear liquids until 5am upon the day of the procedure.  3. Labs: You will need to have blood drawn on Tuesday, May 31 at 12:45 in the [facility]. You do not need to be fasting.  4. Medication instructions in preparation for your procedure:   Contrast Allergy: No    Stop taking Eliquis (Apixiban) on Tuesday, May 31.  Stop taking, Lasix (Furosemide)  Thursday, June 2,  Take only 12 units of insulin the night before your procedure. Do not take any insulin on the day of the procedure.    On the morning of your procedure, take your Aspirin and any morning medicines NOT listed above.  You may use sips of water.  5. Plan for one night stay--bring personal belongings. 6. Bring a current list of your medications and current insurance cards. 7. You MUST have a responsible person to drive you home. 8. Someone MUST be with you the first 24 hours after you arrive home or your discharge will be delayed. 9. Please wear clothes that are easy to get on and off and wear slip-on shoes.     [employer]  Implant  1. Plan to be at the hospital for approximately 6-8 hours. The amount of time you are monitored in recovery will depend on your care needs. 2.   DO NOT eat or drink anything after midnight except for essential medications.      What to bring  -Insurance card -A current list of medications -A partner in care, someone in your support network, to:  Participate in the education session and assist where needed with home care  Drive you home  During your stay  The right heart catheterization  will take approximately 1 hour  You will receive patient education and necessary resources for transmissions (special pillow and antennae) prior to leaving  Once you are at home  Lay on the pillow and send a transmission every morning Make sure to take your Plavix 75 mg daily for one month and Aspirin ___81__mg daily for life Our office will call you weekly for the first 3 weeks You will be seen for a follow up appointment on ____8/4/22_____ You will receive periodic phone calls either from our office or automated system You will receive a bill from our office once a month

## 2020-09-07 ENCOUNTER — Emergency Department (HOSPITAL_COMMUNITY)
Admission: EM | Admit: 2020-09-07 | Discharge: 2020-09-07 | Disposition: A | Discharge status: Home / Self Care | Payer: Medicare Other | Attending: Emergency Medicine | Admitting: Emergency Medicine

## 2020-09-07 ENCOUNTER — Telehealth (HOSPITAL_COMMUNITY): Payer: Self-pay

## 2020-09-07 ENCOUNTER — Other Ambulatory Visit: Payer: Self-pay

## 2020-09-07 ENCOUNTER — Other Ambulatory Visit (HOSPITAL_COMMUNITY): Payer: Self-pay

## 2020-09-07 ENCOUNTER — Encounter (HOSPITAL_COMMUNITY): Payer: Self-pay

## 2020-09-07 DIAGNOSIS — N184 Chronic kidney disease, stage 4 (severe): Secondary | ICD-10-CM | POA: Insufficient documentation

## 2020-09-07 DIAGNOSIS — R42 Dizziness and giddiness: Secondary | ICD-10-CM

## 2020-09-07 DIAGNOSIS — Z7901 Long term (current) use of anticoagulants: Secondary | ICD-10-CM | POA: Insufficient documentation

## 2020-09-07 DIAGNOSIS — Z79899 Other long term (current) drug therapy: Secondary | ICD-10-CM | POA: Insufficient documentation

## 2020-09-07 DIAGNOSIS — E1122 Type 2 diabetes mellitus with diabetic chronic kidney disease: Secondary | ICD-10-CM | POA: Insufficient documentation

## 2020-09-07 DIAGNOSIS — E1110 Type 2 diabetes mellitus with ketoacidosis without coma: Secondary | ICD-10-CM | POA: Insufficient documentation

## 2020-09-07 DIAGNOSIS — Z794 Long term (current) use of insulin: Secondary | ICD-10-CM | POA: Diagnosis not present

## 2020-09-07 DIAGNOSIS — I130 Hypertensive heart and chronic kidney disease with heart failure and stage 1 through stage 4 chronic kidney disease, or unspecified chronic kidney disease: Secondary | ICD-10-CM | POA: Insufficient documentation

## 2020-09-07 DIAGNOSIS — R55 Syncope and collapse: Secondary | ICD-10-CM | POA: Diagnosis not present

## 2020-09-07 DIAGNOSIS — I5042 Chronic combined systolic (congestive) and diastolic (congestive) heart failure: Secondary | ICD-10-CM | POA: Diagnosis not present

## 2020-09-07 DIAGNOSIS — I959 Hypotension, unspecified: Secondary | ICD-10-CM

## 2020-09-07 LAB — BASIC METABOLIC PANEL
Anion gap: 8 (ref 5–15)
BUN: 31 mg/dL — ABNORMAL HIGH (ref 6–20)
CO2: 27 mmol/L (ref 22–32)
Calcium: 9.2 mg/dL (ref 8.9–10.3)
Chloride: 101 mmol/L (ref 98–111)
Creatinine, Ser: 2.79 mg/dL — ABNORMAL HIGH (ref 0.61–1.24)
GFR, Estimated: 27 mL/min — ABNORMAL LOW (ref >60)
Glucose, Bld: 139 mg/dL — ABNORMAL HIGH (ref 70–99)
Potassium: 3.9 mmol/L (ref 3.5–5.1)
Sodium: 136 mmol/L (ref 135–145)

## 2020-09-07 LAB — CBC WITH DIFFERENTIAL/PLATELET
Abs Immature Granulocytes: 0.03 10*3/uL (ref 0.00–0.07)
Basophils Absolute: 0 10*3/uL (ref 0.0–0.1)
Basophils Relative: 1 %
Eosinophils Absolute: 0 10*3/uL (ref 0.0–0.5)
Eosinophils Relative: 1 %
HCT: 33.2 % — ABNORMAL LOW (ref 39.0–52.0)
Hemoglobin: 10.8 g/dL — ABNORMAL LOW (ref 13.0–17.0)
Immature Granulocytes: 1 %
Lymphocytes Relative: 24 %
Lymphs Abs: 1 10*3/uL (ref 0.7–4.0)
MCH: 29 pg (ref 26.0–34.0)
MCHC: 32.5 g/dL (ref 30.0–36.0)
MCV: 89.2 fL (ref 80.0–100.0)
Monocytes Absolute: 0.6 10*3/uL (ref 0.1–1.0)
Monocytes Relative: 13 %
Neutro Abs: 2.7 10*3/uL (ref 1.7–7.7)
Neutrophils Relative %: 60 %
Platelets: 215 10*3/uL (ref 150–400)
RBC: 3.72 MIL/uL — ABNORMAL LOW (ref 4.22–5.81)
RDW: 13.3 % (ref 11.5–15.5)
WBC: 4.4 10*3/uL (ref 4.0–10.5)
nRBC: 0 % (ref 0.0–0.2)

## 2020-09-07 LAB — LACTIC ACID, PLASMA: Lactic Acid, Venous: 1.7 mmol/L (ref 0.5–1.9)

## 2020-09-07 MED ORDER — SODIUM CHLORIDE 0.9 % IV BOLUS: 1000.0000 mL | Freq: Once | INTRAVENOUS | Status: DC

## 2020-09-07 NOTE — ED Notes (Signed)
Pt called his own [facility] as he was not willing to wait for [facility]. Pt provided with socks and was wheeled outside. Rebekah, interpreted.

## 2020-09-07 NOTE — Progress Notes (Signed)
Paramedicine Encounter    Patient ID: Troy Rush, male    DOB: 01-06-1971, 50 y.o.   MRN: [id number]   Patient Care Team: Larey Dresser, MD as PCP - Cardiology (Cardiology) Conrad [place], NP as Nurse Practitioner (Cardiology) Larey Dresser, MD as Consulting Physician (Cardiology) Jorge Ny, LCSW as Social Worker (Licensed Clinical Social Worker)  Patient Active Problem List   Diagnosis Date Noted  . CKD (chronic kidney disease) stage 3, GFR 30-59 ml/min (HCC) 07/08/2020  . Acute CHF (congestive heart failure) ([place]) 07/07/2020  . CKD (chronic kidney disease), stage IV ([place]) 07/07/2020  . ICD (implantable cardioverter-defibrillator) in place 05/14/2020  . Pulmonary embolism ([place]) 02/05/2020  . Acute kidney injury superimposed on CKD ([place])   . Chronic combined systolic and diastolic heart failure ([place])   . Atrial fibrillation with RVR ([place]) 02/01/2020  . NSVT (nonsustained ventricular tachycardia) ([place]) 02/01/2020  . Acute GI bleeding 02/01/2020  . DKA (diabetic ketoacidosis) ([place]) 02/01/2020  . Bilateral deafness 08/15/2019  . Type 2 diabetes mellitus with complication, with long-term current use of insulin ([place]) 08/15/2019  . Hemoglobin A1C between 7% and 9% indicating borderline diabetic control 08/15/2019  . Insomnia 08/15/2019  . Abdominal pain 11/14/2018  . Ischemic cardiomyopathy 10/14/2016  . NICM (nonischemic cardiomyopathy) ([place]) 09/15/2016  . Chronic systolic CHF (congestive heart failure) ([place]) 05/30/2016  . HTN (hypertension) 05/30/2016  . Deaf 05/30/2016  . Hyperkalemia 05/30/2016  . Diabetes ([place]) 03/21/2016  . Major depressive disorder, recurrent episode ([place]) 03/21/2016  . Bronchitis 01/21/2015  . Dyslipidemia 01/21/2015  . Vitamin D deficiency 12/19/2011  . Personal history of other infectious and parasitic diseases 10/25/2011  . Anxiety disorder 05/12/2011  . Hyperlipidemia 05/12/2011  . Hypertriglyceridemia 05/12/2011  . Obstructive sleep apnea  05/12/2011    Current Outpatient Medications:  .  atorvastatin (LIPITOR) 80 MG tablet, Take 1 tablet (80 mg total) by mouth at bedtime., Disp: 30 tablet, Rfl: 11 .  carvedilol (COREG) 25 MG tablet, Take 1 tablet (25 mg total) by mouth 2 (two) times daily with a meal., Disp: 60 tablet, Rfl: 6 .  dapagliflozin propanediol (FARXIGA) 10 MG TABS tablet, Take 1 tablet (10 mg total) by mouth daily before breakfast., Disp: 30 tablet, Rfl: 11 .  dicyclomine (BENTYL) 20 MG tablet, Take 1 tablet (20 mg total) by mouth 2 (two) times daily. (Patient not taking: Reported on 08/27/2020), Disp: 20 tablet, Rfl: 0 .  ELIQUIS 5 MG TABS tablet, TAKE 1 TABLET (5 MG TOTAL) BY MOUTH 2 (TWO) TIMES DAILY., Disp: 180 tablet, Rfl: 2 .  fenofibrate 54 MG tablet, TAKE 1 TABLET (54 MG TOTAL) BY MOUTH DAILY., Disp: 90 tablet, Rfl: 0 .  furosemide (LASIX) 20 MG tablet, Take 3 tablets (60 mg total) by mouth daily., Disp: 90 tablet, Rfl: 0 .  hydrALAZINE (APRESOLINE) 100 MG tablet, TAKE 1 TABLET (100 MG TOTAL) BY MOUTH 3 (THREE) TIMES DAILY., Disp: 90 tablet, Rfl: 3 .  insulin glargine (LANTUS) 100 UNIT/ML injection, Inject 0.24 mLs (24 Units total) into the skin daily., Disp: 10 mL, Rfl: 11 .  isosorbide mononitrate (IMDUR) 60 MG 24 hr tablet, Take 1 tablet (60 mg total) by mouth daily., Disp: 30 tablet, Rfl: 11 .  Lancets MISC, 1 each by Does not apply route 3 (three) times daily as needed., Disp: 300 each, Rfl: 3 .  latanoprost (XALATAN) 0.005 % ophthalmic solution, Place 1 drop into both eyes at bedtime., Disp: , Rfl:  .  meclizine (ANTIVERT) 25  MG tablet, Take 1 tablet (25 mg total) by mouth 3 (three) times daily as needed for dizziness., Disp: 90 tablet, Rfl: 3 .  pantoprazole (PROTONIX) 40 MG tablet, Take 1 tablet (40 mg total) by mouth 2 (two) times daily., Disp: 180 tablet, Rfl: 3 .  VASCEPA 1 g capsule, TAKE TWO CAPSULES BY MOUTH TWICE A DAY, Disp: 120 capsule, Rfl: 5 Allergies  Allergen Reactions  . Lisinopril Cough       Social History   Socioeconomic History  . Marital status: Legally Separated    Spouse name: Not on file  . Number of children: Not on file  . Years of education: Not on file  . Highest education level: Not on file  Occupational History  . Not on file  Tobacco Use  . Smoking status: Never Smoker  . Smokeless tobacco: Never Used  Vaping Use  . Vaping Use: Never used  Substance and Sexual Activity  . Alcohol use: Yes    Alcohol/week: 1.0 - 2.0 standard drink    Types: 1 - 2 Glasses of wine per week    Comment: rare  . Drug use: No  . Sexual activity: Yes  Other Topics Concern  . Not on file  Social History Narrative  . Not on file   Social Determinants of Health   Financial Resource Strain: Medium Risk  . Difficulty of Paying Living Expenses: Somewhat hard  Food Insecurity: No Food Insecurity  . Worried About Charity fundraiser in the Last Year: Never true  . Ran Out of Food in the Last Year: Never true  Transportation Needs: No Transportation Needs  . Lack of Transportation (Medical): No  . Lack of Transportation (Non-Medical): No  Physical Activity: Not on file  Stress: Not on file  Social Connections: Not on file  Intimate Partner Violence: Not on file    Physical Exam      Future Appointments  Date Time Provider [place]  09/08/2020 12:45 PM [facility] LAB [facility] None  09/21/2020  8:35 AM [facility] DEVICE REMOTES [facility]  10/14/2020 10:40 AM Vevelyn Francois, NP [place] None  11/12/2020 11:20 AM Larey Dresser, MD [facility] None  12/21/2020  8:35 AM [facility] DEVICE REMOTES [facility]  03/22/2021  8:35 AM [facility] DEVICE REMOTES [facility]  06/21/2021  8:35 AM [facility] DEVICE REMOTES [facility]  09/20/2021  8:35 AM [facility] DEVICE REMOTES [facility]  12/20/2021  8:35 AM [facility] DEVICE REMOTES [facility]    BP (!) 50/35   Pulse 80   Resp 18    Wt 227 lb (103 kg)   SpO2 98%   BMI 32.57 kg/m    CBG EMS-186 Weight yesterday-? Last visit weight-230  Pt reports he didn't sleep well last night, inquiring about his CPAP. Will f/u on that.  When he got up to weigh for me today he nearly fell due to being so dizzy and had to hold onto the chair and the wall, I had to get chair to him before he fell.  Rechecked his b/p a few times and got in the 50s each time. Checked it with his home machine as well and it was same.  I called for EMS unit to take him to hosp, (clinic was closed today for further guidance) He denies sob, no c/p, he denied n/v/d.  Ate small amount of food this morning.  Started IV to his rt hand to get some fluids started at a monitored rate and done 12 lead  obtained.  Care transferred to EMS unit.  Will f/u once he is d/c.   [name], [place] Paramedic  09/07/20

## 2020-09-07 NOTE — Telephone Encounter (Signed)
Pt reported he has not heard from anyone regarding CPAP, I tried calling [facility], no answer, I did LVM asking them to return my call.    [name], EMT-Paramedic 09/07/20

## 2020-09-07 NOTE — ED Triage Notes (Signed)
BIB EMS for hypotension. Pt was working with home health nurse and found that pt has unsteady gait. Initial BP was 60/40. IVF was given, BP improved to 100/60. Pt is deaf. 72heart rate NSR, 186 CBG and 97% on room air.

## 2020-09-07 NOTE — ED Provider Notes (Signed)
[facility] EMERGENCY DEPARTMENT Provider Note   CSN: [id number] Arrival date & time: 09/07/20  1246     History Chief Complaint  Patient presents with  . Hypotension    Troy Rush is a 50 y.o. male.  Patient presents with chief complaint of low blood pressure and dizziness.  Patient states that he had a home health care personnel come to his home today to check him out.  They typically come once every week or 2 weeks.  He said that during the evaluation they asked him to stand up and walk a certain way and when he did he got lightheaded and dizzy and had to hold onto things to prevent a fall.  The healthcare representative took his blood pressure and found it to be low and sent her to the ER for evaluation.  The patient himself states that he had some dizziness but denies any headache or chest pain or abdominal pain.  No reports of fevers or cough or vomiting or diarrhea.  He states that he is a history of diabetes and has intermittent numbness and tingling in his feet which are unchanged today.        Past Medical History:  Diagnosis Date  . AICD (automatic cardioverter/defibrillator) present 10/14/2016  . Bilateral deafness   . Bronchitis   . CHF (congestive heart failure) ([place])   . Depression   . Diabetes mellitus without complication ([place])   . DVT, lower extremity ([place]) 07/12/2019  . Dyslipidemia   . Headache 08/2019  . Hypertension   . ICD (implantable cardioverter-defibrillator) in place   . Insomnia 08/2019  . NICM (nonischemic cardiomyopathy) ([place]) 09/15/2016  . Pancreatitis   . Sleep apnea   . Vitamin D deficiency 08/2019    Patient Active Problem List   Diagnosis Date Noted  . CKD (chronic kidney disease) stage 3, GFR 30-59 ml/min (HCC) 07/08/2020  . Acute CHF (congestive heart failure) ([place]) 07/07/2020  . CKD (chronic kidney disease), stage IV ([place]) 07/07/2020  . ICD (implantable cardioverter-defibrillator) in place 05/14/2020  .  Pulmonary embolism ([place]) 02/05/2020  . Acute kidney injury superimposed on CKD ([place])   . Chronic combined systolic and diastolic heart failure ([place])   . Atrial fibrillation with RVR ([place]) 02/01/2020  . NSVT (nonsustained ventricular tachycardia) ([place]) 02/01/2020  . Acute GI bleeding 02/01/2020  . DKA (diabetic ketoacidosis) ([place]) 02/01/2020  . Bilateral deafness 08/15/2019  . Type 2 diabetes mellitus with complication, with long-term current use of insulin ([place]) 08/15/2019  . Hemoglobin A1C between 7% and 9% indicating borderline diabetic control 08/15/2019  . Insomnia 08/15/2019  . Abdominal pain 11/14/2018  . Ischemic cardiomyopathy 10/14/2016  . NICM (nonischemic cardiomyopathy) ([place]) 09/15/2016  . Chronic systolic CHF (congestive heart failure) ([place]) 05/30/2016  . HTN (hypertension) 05/30/2016  . Deaf 05/30/2016  . Hyperkalemia 05/30/2016  . Diabetes ([place]) 03/21/2016  . Major depressive disorder, recurrent episode ([place]) 03/21/2016  . Bronchitis 01/21/2015  . Dyslipidemia 01/21/2015  . Vitamin D deficiency 12/19/2011  . Personal history of other infectious and parasitic diseases 10/25/2011  . Anxiety disorder 05/12/2011  . Hyperlipidemia 05/12/2011  . Hypertriglyceridemia 05/12/2011  . Obstructive sleep apnea 05/12/2011    Past Surgical History:  Procedure Laterality Date  . BACK SURGERY    . CARDIAC CATHETERIZATION    . ESOPHAGOGASTRODUODENOSCOPY (EGD) WITH PROPOFOL N/A 02/02/2020   Procedure: ESOPHAGOGASTRODUODENOSCOPY (EGD) WITH PROPOFOL;  Surgeon: Arta Silence, MD;  Location: [facility] ENDOSCOPY;  Service: Endoscopy;  Laterality: N/A;  .  ICD IMPLANT  10/14/2016  . ICD IMPLANT N/A 10/14/2016   Procedure: ICD Implant;  Surgeon: Deboraha Sprang, MD;  Location: [facility] CV LAB;  Service: Cardiovascular;  Laterality: N/A;  . RIGHT HEART CATH N/A 05/21/2020   Procedure: RIGHT HEART CATH;  Surgeon: Larey Dresser, MD;  Location: [facility] CV LAB;  Service: Cardiovascular;   Laterality: N/A;  . WRIST SURGERY         Family History  Problem Relation Age of Onset  . Hypertension Mother   . Healthy Father   . Stroke Father     Social History   Tobacco Use  . Smoking status: Never Smoker  . Smokeless tobacco: Never Used  Vaping Use  . Vaping Use: Never used  Substance Use Topics  . Alcohol use: Yes    Alcohol/week: 1.0 - 2.0 standard drink    Types: 1 - 2 Glasses of wine per week    Comment: rare  . Drug use: No    Home Medications Prior to Admission medications   Medication Sig Start Date End Date Taking? Authorizing Provider  atorvastatin (LIPITOR) 80 MG tablet Take 1 tablet (80 mg total) by mouth at bedtime. 03/18/20   Larey Dresser, MD  carvedilol (COREG) 25 MG tablet Take 1 tablet (25 mg total) by mouth 2 (two) times daily with a meal. 04/15/20   Larey Dresser, MD  dapagliflozin propanediol (FARXIGA) 10 MG TABS tablet Take 1 tablet (10 mg total) by mouth daily before breakfast. 04/16/20   [place], Maricela Bo, FNP  dicyclomine (BENTYL) 20 MG tablet Take 1 tablet (20 mg total) by mouth 2 (two) times daily. Patient not taking: Reported on 08/27/2020 06/23/20   Marcello Fennel, PA-C  ELIQUIS 5 MG TABS tablet TAKE 1 TABLET (5 MG TOTAL) BY MOUTH 2 (TWO) TIMES DAILY. 07/06/20   Larey Dresser, MD  fenofibrate 54 MG tablet TAKE 1 TABLET (54 MG TOTAL) BY MOUTH DAILY. 07/06/20 10/04/20  Larey Dresser, MD  furosemide (LASIX) 20 MG tablet Take 3 tablets (60 mg total) by mouth daily. 07/10/20 08/09/20  Arrien, Jimmy Picket, MD  hydrALAZINE (APRESOLINE) 100 MG tablet TAKE 1 TABLET (100 MG TOTAL) BY MOUTH 3 (THREE) TIMES DAILY. 08/14/20   Bensimhon, Shaune Pascal, MD  insulin glargine (LANTUS) 100 UNIT/ML injection Inject 0.24 mLs (24 Units total) into the skin daily. 02/04/20   Raiford Noble Latif, DO  isosorbide mononitrate (IMDUR) 60 MG 24 hr tablet Take 1 tablet (60 mg total) by mouth daily. 04/09/20   Larey Dresser, MD  Lancets MISC 1 each by Does not  apply route 3 (three) times daily as needed. 03/11/20   [name], FNP  latanoprost (XALATAN) 0.005 % ophthalmic solution Place 1 drop into both eyes at bedtime. 01/20/20   [provider]  meclizine (ANTIVERT) 25 MG tablet Take 1 tablet (25 mg total) by mouth 3 (three) times daily as needed for dizziness. 07/14/20   [name], FNP  pantoprazole (PROTONIX) 40 MG tablet Take 1 tablet (40 mg total) by mouth 2 (two) times daily. 03/25/20   [name], FNP  VASCEPA 1 g capsule TAKE TWO CAPSULES BY MOUTH TWICE A DAY 07/06/20   Larey Dresser, MD    Allergies    Lisinopril  Review of Systems   Review of Systems  Constitutional: Negative for fever.  HENT: Negative for ear pain and sore throat.   Eyes: Negative for pain.  Respiratory: Negative for  cough.   Cardiovascular: Negative for chest pain.  Gastrointestinal: Negative for abdominal pain.  Genitourinary: Negative for flank pain.  Musculoskeletal: Negative for back pain.  Skin: Negative for color change and rash.  Neurological: Negative for syncope.  All other systems reviewed and are negative.   Physical Exam Updated Vital Signs BP 109/74   Pulse 78   Temp 98.6 F (37 C) (Oral)   Resp 17   SpO2 98%   Physical Exam Constitutional:      General: He is not in acute distress.    Appearance: He is well-developed.  HENT:     Head: Normocephalic.     Nose: Nose normal.  Eyes:     Extraocular Movements: Extraocular movements intact.  Cardiovascular:     Rate and Rhythm: Normal rate.  Pulmonary:     Effort: Pulmonary effort is normal.  Skin:    Coloration: Skin is not jaundiced.  Neurological:     General: No focal deficit present.     Mental Status: He is alert and oriented to person, place, and time. Mental status is at baseline.     Cranial Nerves: No cranial nerve deficit.     Motor: No weakness.     ED Results / Procedures / Treatments   Labs (all labs ordered are listed, but only  abnormal results are displayed) Labs Reviewed  BASIC METABOLIC PANEL - Abnormal; Notable for the following components:      Result Value   Glucose, Bld 139 (*)    BUN 31 (*)    Creatinine, Ser 2.79 (*)    GFR, Estimated 27 (*)    All other components within normal limits  CBC WITH DIFFERENTIAL/PLATELET - Abnormal; Notable for the following components:   RBC 3.72 (*)    Hemoglobin 10.8 (*)    HCT 33.2 (*)    All other components within normal limits  LACTIC ACID, PLASMA  CBC WITH DIFFERENTIAL/PLATELET    EKG EKG Interpretation  Date/Time:  Monday Sep 07 2020 13:06:03 EDT Ventricular Rate:  79 PR Interval:  193 QRS Duration: 94 QT Interval:  443 QTC Calculation: 508 R Axis:   29 Text Interpretation: Sinus rhythm Left ventricular hypertrophy Abnormal T, consider ischemia, lateral leads Prolonged QT interval Confirmed by Thamas Jaegers ([id number]) on 09/07/2020 1:55:57 PM   Radiology No results found.  Procedures Procedures   Medications Ordered in ED Medications - No data to display  ED Course  I have reviewed the triage vital signs and the nursing notes.  Pertinent labs & imaging results that were available during my care of the patient were reviewed by me and considered in my medical decision making (see chart for details).    MDM Rules/Calculators/A&P                          History obtained with [facility] and was translator.  Patient has no complaints of pain or discomfort at this time.  Labs are unremarkable white count chemistry normal lactic acid normal.  Patient is afebrile with normal vital signs here.  He did have 1 episode of transient hypotension which appears spontaneously resolved on repeat blood pressures.  He was observed in the hospital for several hours with no additional adverse events.  At bedside he is able to stand up and walk without assistance.  Recommending appropriate hydration at home and follow-up with his doctor on 2 or 3 days in an  outpatient basis.  Advising immediate  return for worsening symptoms fevers pain or any additional concerns.  Final Clinical Impression(s) / ED Diagnoses Final diagnoses:  Dizziness  Hypotensive episode    Rx / DC Orders ED Discharge Orders    None       Luna Fuse, MD 09/07/20 1547

## 2020-09-07 NOTE — Discharge Instructions (Addendum)
Call your primary care doctor or specialist as discussed in the next 2-3 days.   Return immediately back to the ER if:  Your symptoms worsen within the next 12-24 hours. You develop new symptoms such as new fevers, persistent vomiting, new pain, shortness of breath, or new weakness or numbness, or if you have any other concerns.  

## 2020-09-08 ENCOUNTER — Other Ambulatory Visit (HOSPITAL_COMMUNITY): Payer: Medicare Other

## 2020-09-08 ENCOUNTER — Other Ambulatory Visit (HOSPITAL_COMMUNITY): Payer: Self-pay

## 2020-09-08 ENCOUNTER — Other Ambulatory Visit (HOSPITAL_COMMUNITY): Payer: Self-pay | Admitting: Cardiology

## 2020-09-08 NOTE — Progress Notes (Signed)
Paramedicine Encounter    Patient ID: Troy Rush, male    DOB: 02-15-1971, 50 y.o.   MRN: [id number]   Patient Care Team: Pcp, No as PCP - General Larey Dresser, MD as PCP - Cardiology (Cardiology) Conrad [place], NP as Nurse Practitioner (Cardiology) Larey Dresser, MD as Consulting Physician (Cardiology) Jorge Ny, LCSW as Social Worker (Licensed Clinical Social Worker)  Patient Active Problem List   Diagnosis Date Noted  . CKD (chronic kidney disease) stage 3, GFR 30-59 ml/min (HCC) 07/08/2020  . Acute CHF (congestive heart failure) ([place]) 07/07/2020  . CKD (chronic kidney disease), stage IV ([place]) 07/07/2020  . ICD (implantable cardioverter-defibrillator) in place 05/14/2020  . Pulmonary embolism ([place]) 02/05/2020  . Acute kidney injury superimposed on CKD ([place])   . Chronic combined systolic and diastolic heart failure ([place])   . Atrial fibrillation with RVR ([place]) 02/01/2020  . NSVT (nonsustained ventricular tachycardia) ([place]) 02/01/2020  . Acute GI bleeding 02/01/2020  . DKA (diabetic ketoacidosis) ([place]) 02/01/2020  . Bilateral deafness 08/15/2019  . Type 2 diabetes mellitus with complication, with long-term current use of insulin ([place]) 08/15/2019  . Hemoglobin A1C between 7% and 9% indicating borderline diabetic control 08/15/2019  . Insomnia 08/15/2019  . Abdominal pain 11/14/2018  . Ischemic cardiomyopathy 10/14/2016  . NICM (nonischemic cardiomyopathy) ([place]) 09/15/2016  . Chronic systolic CHF (congestive heart failure) ([place]) 05/30/2016  . HTN (hypertension) 05/30/2016  . Deaf 05/30/2016  . Hyperkalemia 05/30/2016  . Diabetes ([place]) 03/21/2016  . Major depressive disorder, recurrent episode ([place]) 03/21/2016  . Bronchitis 01/21/2015  . Dyslipidemia 01/21/2015  . Vitamin D deficiency 12/19/2011  . Personal history of other infectious and parasitic diseases 10/25/2011  . Anxiety disorder 05/12/2011  . Hyperlipidemia 05/12/2011  . Hypertriglyceridemia 05/12/2011   . Obstructive sleep apnea 05/12/2011    Current Outpatient Medications:  .  atorvastatin (LIPITOR) 80 MG tablet, Take 1 tablet (80 mg total) by mouth at bedtime., Disp: 30 tablet, Rfl: 11 .  carvedilol (COREG) 25 MG tablet, Take 1 tablet (25 mg total) by mouth 2 (two) times daily with a meal., Disp: 60 tablet, Rfl: 6 .  dapagliflozin propanediol (FARXIGA) 10 MG TABS tablet, Take 1 tablet (10 mg total) by mouth daily before breakfast., Disp: 30 tablet, Rfl: 11 .  ELIQUIS 5 MG TABS tablet, TAKE 1 TABLET (5 MG TOTAL) BY MOUTH 2 (TWO) TIMES DAILY., Disp: 180 tablet, Rfl: 2 .  fenofibrate 54 MG tablet, TAKE 1 TABLET (54 MG TOTAL) BY MOUTH DAILY., Disp: 90 tablet, Rfl: 0 .  hydrALAZINE (APRESOLINE) 100 MG tablet, TAKE 1 TABLET (100 MG TOTAL) BY MOUTH 3 (THREE) TIMES DAILY., Disp: 90 tablet, Rfl: 3 .  insulin glargine (LANTUS) 100 UNIT/ML injection, Inject 0.24 mLs (24 Units total) into the skin daily., Disp: 10 mL, Rfl: 11 .  isosorbide mononitrate (IMDUR) 60 MG 24 hr tablet, Take 1 tablet (60 mg total) by mouth daily., Disp: 30 tablet, Rfl: 11 .  Lancets MISC, 1 each by Does not apply route 3 (three) times daily as needed., Disp: 300 each, Rfl: 3 .  latanoprost (XALATAN) 0.005 % ophthalmic solution, Place 1 drop into both eyes at bedtime., Disp: , Rfl:  .  meclizine (ANTIVERT) 25 MG tablet, Take 1 tablet (25 mg total) by mouth 3 (three) times daily as needed for dizziness., Disp: 90 tablet, Rfl: 3 .  pantoprazole (PROTONIX) 40 MG tablet, Take 1 tablet (40 mg total) by mouth 2 (two) times daily., Disp: 180 tablet,  Rfl: 3 .  VASCEPA 1 g capsule, TAKE TWO CAPSULES BY MOUTH TWICE A DAY, Disp: 120 capsule, Rfl: 5 .  dicyclomine (BENTYL) 20 MG tablet, Take 1 tablet (20 mg total) by mouth 2 (two) times daily. (Patient not taking: No sig reported), Disp: 20 tablet, Rfl: 0 .  furosemide (LASIX) 20 MG tablet, Take 3 tablets (60 mg total) by mouth daily., Disp: 90 tablet, Rfl: 0 Allergies  Allergen Reactions   . Lisinopril Cough      Social History   Socioeconomic History  . Marital status: Legally Separated    Spouse name: Not on file  . Number of children: Not on file  . Years of education: Not on file  . Highest education level: Not on file  Occupational History  . Not on file  Tobacco Use  . Smoking status: Never Smoker  . Smokeless tobacco: Never Used  Vaping Use  . Vaping Use: Never used  Substance and Sexual Activity  . Alcohol use: Yes    Alcohol/week: 1.0 - 2.0 standard drink    Types: 1 - 2 Glasses of wine per week    Comment: rare  . Drug use: No  . Sexual activity: Yes  Other Topics Concern  . Not on file  Social History Narrative  . Not on file   Social Determinants of Health   Financial Resource Strain: Medium Risk  . Difficulty of Paying Living Expenses: Somewhat hard  Food Insecurity: No Food Insecurity  . Worried About Charity fundraiser in the Last Year: Never true  . Ran Out of Food in the Last Year: Never true  Transportation Needs: No Transportation Needs  . Lack of Transportation (Medical): No  . Lack of Transportation (Non-Medical): No  Physical Activity: Not on file  Stress: Not on file  Social Connections: Not on file  Intimate Partner Violence: Not on file    Physical Exam      Future Appointments  Date Time Provider [place]  09/08/2020 12:45 PM [facility] LAB [facility] None  09/21/2020  8:35 AM [facility] DEVICE REMOTES [facility]  10/14/2020 10:40 AM Vevelyn Francois, NP [place] None  11/12/2020 11:20 AM Larey Dresser, MD [facility] None  12/21/2020  8:35 AM [facility] DEVICE REMOTES [facility]  03/22/2021  8:35 AM [facility] DEVICE REMOTES [facility]  06/21/2021  8:35 AM [facility] DEVICE REMOTES [facility]  09/20/2021  8:35 AM [facility] DEVICE REMOTES [facility]  12/20/2021  8:35 AM [facility] DEVICE REMOTES [facility]    BP 110/68   Pulse  82   Resp 18   SpO2 98%  B/p standing-98/60 Back today for recheck after pts drop in b/p yesterday and had to be seen at ER.  Pt is up and moving around well. He states he feels better. Not really sure what caused that drop in b/p.  meds verified. Pill box fixed as noted with the cardiomems prep for 2 days prior and the day of for his meds.  Relayed the 12 point orthostatic change to triage nurse.  -carvedilol, farxiga is needed.  I called it in last week but they did not bring it out. Pharmacy said they would bring it out today.   [name], [facility] Paramedic  09/08/20

## 2020-09-10 ENCOUNTER — Observation Stay (HOSPITAL_COMMUNITY)
Admission: RE | Admit: 2020-09-10 | Discharge: 2020-09-11 | Disposition: A | Discharge status: Home / Self Care | Payer: Medicare Other | Attending: Cardiology | Admitting: Cardiology

## 2020-09-10 ENCOUNTER — Encounter (HOSPITAL_COMMUNITY): Payer: Self-pay | Admitting: Cardiology

## 2020-09-10 ENCOUNTER — Other Ambulatory Visit: Payer: Self-pay

## 2020-09-10 ENCOUNTER — Encounter (HOSPITAL_COMMUNITY)
Admission: RE | Disposition: A | Discharge status: Home / Self Care | Payer: Self-pay | Source: Home / Self Care | Attending: Cardiology

## 2020-09-10 DIAGNOSIS — I5022 Chronic systolic (congestive) heart failure: Secondary | ICD-10-CM | POA: Insufficient documentation

## 2020-09-10 DIAGNOSIS — N1832 Chronic kidney disease, stage 3b: Secondary | ICD-10-CM | POA: Insufficient documentation

## 2020-09-10 DIAGNOSIS — Z794 Long term (current) use of insulin: Secondary | ICD-10-CM | POA: Insufficient documentation

## 2020-09-10 DIAGNOSIS — I509 Heart failure, unspecified: Secondary | ICD-10-CM

## 2020-09-10 DIAGNOSIS — F1010 Alcohol abuse, uncomplicated: Secondary | ICD-10-CM | POA: Diagnosis not present

## 2020-09-10 DIAGNOSIS — U071 COVID-19: Secondary | ICD-10-CM | POA: Insufficient documentation

## 2020-09-10 DIAGNOSIS — E785 Hyperlipidemia, unspecified: Secondary | ICD-10-CM | POA: Insufficient documentation

## 2020-09-10 DIAGNOSIS — I4891 Unspecified atrial fibrillation: Principal | ICD-10-CM | POA: Insufficient documentation

## 2020-09-10 DIAGNOSIS — Z79899 Other long term (current) drug therapy: Secondary | ICD-10-CM | POA: Insufficient documentation

## 2020-09-10 DIAGNOSIS — I130 Hypertensive heart and chronic kidney disease with heart failure and stage 1 through stage 4 chronic kidney disease, or unspecified chronic kidney disease: Secondary | ICD-10-CM | POA: Insufficient documentation

## 2020-09-10 DIAGNOSIS — R0600 Dyspnea, unspecified: Secondary | ICD-10-CM | POA: Diagnosis present

## 2020-09-10 DIAGNOSIS — E1122 Type 2 diabetes mellitus with diabetic chronic kidney disease: Secondary | ICD-10-CM | POA: Diagnosis not present

## 2020-09-10 DIAGNOSIS — Z7901 Long term (current) use of anticoagulants: Secondary | ICD-10-CM | POA: Diagnosis not present

## 2020-09-10 HISTORY — PX: PRESSURE SENSOR/CARDIOMEMS: CATH118258

## 2020-09-10 LAB — POCT I-STAT EG7
Acid-Base Excess: 3 mmol/L — ABNORMAL HIGH (ref 0.0–2.0)
Acid-Base Excess: 4 mmol/L — ABNORMAL HIGH (ref 0.0–2.0)
Bicarbonate: 30.6 mmol/L — ABNORMAL HIGH (ref 20.0–28.0)
Bicarbonate: 31.3 mmol/L — ABNORMAL HIGH (ref 20.0–28.0)
Calcium, Ion: 1.12 mmol/L — ABNORMAL LOW (ref 1.15–1.40)
Calcium, Ion: 1.18 mmol/L (ref 1.15–1.40)
HCT: 33 % — ABNORMAL LOW (ref 39.0–52.0)
HCT: 34 % — ABNORMAL LOW (ref 39.0–52.0)
Hemoglobin: 11.2 g/dL — ABNORMAL LOW (ref 13.0–17.0)
Hemoglobin: 11.6 g/dL — ABNORMAL LOW (ref 13.0–17.0)
O2 Saturation: 52 %
O2 Saturation: 54 %
Potassium: 4.2 mmol/L (ref 3.5–5.1)
Potassium: 4.2 mmol/L (ref 3.5–5.1)
Sodium: 143 mmol/L (ref 135–145)
Sodium: 144 mmol/L (ref 135–145)
TCO2: 32 mmol/L (ref 22–32)
TCO2: 33 mmol/L — ABNORMAL HIGH (ref 22–32)
pCO2, Ven: 60.3 mmHg — ABNORMAL HIGH (ref 44.0–60.0)
pCO2, Ven: 61.8 mmHg — ABNORMAL HIGH (ref 44.0–60.0)
pH, Ven: 7.313 (ref 7.250–7.430)
pH, Ven: 7.314 (ref 7.250–7.430)
pO2, Ven: 31 mmHg — CL (ref 32.0–45.0)
pO2, Ven: 32 mmHg (ref 32.0–45.0)

## 2020-09-10 LAB — COMPREHENSIVE METABOLIC PANEL
ALT: 27 U/L (ref 0–44)
AST: 37 U/L (ref 15–41)
Albumin: 3.2 g/dL — ABNORMAL LOW (ref 3.5–5.0)
Alkaline Phosphatase: 47 U/L (ref 38–126)
Anion gap: 8 (ref 5–15)
BUN: 34 mg/dL — ABNORMAL HIGH (ref 6–20)
CO2: 29 mmol/L (ref 22–32)
Calcium: 8.9 mg/dL (ref 8.9–10.3)
Chloride: 105 mmol/L (ref 98–111)
Creatinine, Ser: 3.11 mg/dL — ABNORMAL HIGH (ref 0.61–1.24)
GFR, Estimated: 23 mL/min — ABNORMAL LOW (ref >60)
Glucose, Bld: 180 mg/dL — ABNORMAL HIGH (ref 70–99)
Potassium: 4.4 mmol/L (ref 3.5–5.1)
Sodium: 142 mmol/L (ref 135–145)
Total Bilirubin: 1 mg/dL (ref 0.3–1.2)
Total Protein: 6.8 g/dL (ref 6.5–8.1)

## 2020-09-10 LAB — CBC WITH DIFFERENTIAL/PLATELET
Abs Immature Granulocytes: 0.02 10*3/uL (ref 0.00–0.07)
Basophils Absolute: 0 10*3/uL (ref 0.0–0.1)
Basophils Relative: 1 %
Eosinophils Absolute: 0.1 10*3/uL (ref 0.0–0.5)
Eosinophils Relative: 2 %
HCT: 35.5 % — ABNORMAL LOW (ref 39.0–52.0)
Hemoglobin: 11 g/dL — ABNORMAL LOW (ref 13.0–17.0)
Immature Granulocytes: 1 %
Lymphocytes Relative: 44 %
Lymphs Abs: 1.5 10*3/uL (ref 0.7–4.0)
MCH: 29 pg (ref 26.0–34.0)
MCHC: 31 g/dL (ref 30.0–36.0)
MCV: 93.7 fL (ref 80.0–100.0)
Monocytes Absolute: 0.5 10*3/uL (ref 0.1–1.0)
Monocytes Relative: 14 %
Neutro Abs: 1.3 10*3/uL — ABNORMAL LOW (ref 1.7–7.7)
Neutrophils Relative %: 38 %
Platelets: 222 10*3/uL (ref 150–400)
RBC: 3.79 MIL/uL — ABNORMAL LOW (ref 4.22–5.81)
RDW: 13.8 % (ref 11.5–15.5)
WBC: 3.4 10*3/uL — ABNORMAL LOW (ref 4.0–10.5)
nRBC: 0 % (ref 0.0–0.2)

## 2020-09-10 LAB — MAGNESIUM: Magnesium: 2.6 mg/dL — ABNORMAL HIGH (ref 1.7–2.4)

## 2020-09-10 LAB — TSH: TSH: 1.957 u[IU]/mL (ref 0.350–4.500)

## 2020-09-10 LAB — GLUCOSE, CAPILLARY
Glucose-Capillary: 348 mg/dL — ABNORMAL HIGH (ref 70–99)
Glucose-Capillary: 283 mg/dL — ABNORMAL HIGH (ref 70–99)
Glucose-Capillary: 223 mg/dL — ABNORMAL HIGH (ref 70–99)
Glucose-Capillary: 176 mg/dL — ABNORMAL HIGH (ref 70–99)
Glucose-Capillary: 196 mg/dL — ABNORMAL HIGH (ref 70–99)

## 2020-09-10 SURGERY — PRESSURE SENSOR/CARDIOMEMS
Anesthesia: LOCAL

## 2020-09-10 MED ORDER — HYDRALAZINE HCL 50 MG PO TABS
75.0000 mg | ORAL_TABLET | Freq: Three times a day (TID) | ORAL | Status: DC
  Administered 2020-09-10: 75 mg via ORAL
  Filled 2020-09-10: qty 1

## 2020-09-10 MED ORDER — SODIUM CHLORIDE 0.9% FLUSH: 3.0000 mL | Freq: Two times a day (BID) | INTRAVENOUS | Status: DC

## 2020-09-10 MED ORDER — INSULIN ASPART 100 UNIT/ML IJ SOLN
0.0000 [IU] | Freq: Three times a day (TID) | INTRAMUSCULAR | Status: DC
  Administered 2020-09-10: 3 [IU] via SUBCUTANEOUS

## 2020-09-10 MED ORDER — LIDOCAINE HCL (PF) 1 % IJ SOLN
INTRAMUSCULAR | Status: DC | PRN
  Administered 2020-09-10: 15 mL

## 2020-09-10 MED ORDER — SODIUM CHLORIDE 0.9 % IV SOLN: 250.0000 mL | INTRAVENOUS | Status: DC | PRN

## 2020-09-10 MED ORDER — PANTOPRAZOLE SODIUM 40 MG PO TBEC
40.0000 mg | DELAYED_RELEASE_TABLET | Freq: Two times a day (BID) | ORAL | Status: DC
  Administered 2020-09-10 – 2020-09-11 (×3): 40 mg via ORAL
  Filled 2020-09-10 (×3): qty 1

## 2020-09-10 MED ORDER — ISOSORBIDE MONONITRATE ER 60 MG PO TB24
60.0000 mg | ORAL_TABLET | Freq: Every day | ORAL | Status: DC
  Administered 2020-09-10: 60 mg via ORAL
  Filled 2020-09-10: qty 1

## 2020-09-10 MED ORDER — MIDAZOLAM HCL 2 MG/2ML IJ SOLN
INTRAMUSCULAR | Status: DC | PRN
  Administered 2020-09-10: 1 mg via INTRAVENOUS

## 2020-09-10 MED ORDER — ACETAMINOPHEN 325 MG PO TABS: 650.0000 mg | ORAL_TABLET | ORAL | Status: DC | PRN

## 2020-09-10 MED ORDER — DICYCLOMINE HCL 20 MG PO TABS
20.0000 mg | ORAL_TABLET | Freq: Two times a day (BID) | ORAL | Status: DC
  Administered 2020-09-10 – 2020-09-11 (×3): 20 mg via ORAL
  Filled 2020-09-10 (×4): qty 1

## 2020-09-10 MED ORDER — HEPARIN (PORCINE) IN NACL 1000-0.9 UT/500ML-% IV SOLN
INTRAVENOUS | Status: AC
  Filled 2020-09-10: qty 1000

## 2020-09-10 MED ORDER — SODIUM CHLORIDE 0.9% FLUSH: 3.0000 mL | INTRAVENOUS | Status: DC | PRN

## 2020-09-10 MED ORDER — SODIUM CHLORIDE 0.9% FLUSH
3.0000 mL | INTRAVENOUS | Status: DC | PRN
  Administered 2020-09-10: 3 mL via INTRAVENOUS

## 2020-09-10 MED ORDER — ISOSORBIDE MONONITRATE ER 30 MG PO TB24
30.0000 mg | ORAL_TABLET | Freq: Every day | ORAL | Status: DC
  Administered 2020-09-11: 30 mg via ORAL
  Filled 2020-09-10: qty 1

## 2020-09-10 MED ORDER — HYDRALAZINE HCL 20 MG/ML IJ SOLN: 10.0000 mg | INTRAMUSCULAR | Status: DC | PRN

## 2020-09-10 MED ORDER — FENTANYL CITRATE (PF) 100 MCG/2ML IJ SOLN
INTRAMUSCULAR | Status: AC
  Filled 2020-09-10: qty 2

## 2020-09-10 MED ORDER — AMIODARONE LOAD VIA INFUSION
150.0000 mg | Freq: Once | INTRAVENOUS | Status: DC
  Filled 2020-09-10: qty 83.34

## 2020-09-10 MED ORDER — SODIUM CHLORIDE 0.9 % IV SOLN: INTRAVENOUS | Status: DC

## 2020-09-10 MED ORDER — SODIUM CHLORIDE 0.9% FLUSH
3.0000 mL | Freq: Two times a day (BID) | INTRAVENOUS | Status: DC
  Administered 2020-09-10 – 2020-09-11 (×2): 3 mL via INTRAVENOUS

## 2020-09-10 MED ORDER — FUROSEMIDE 40 MG PO TABS
40.0000 mg | ORAL_TABLET | Freq: Every morning | ORAL | Status: DC
  Administered 2020-09-11: 40 mg via ORAL
  Filled 2020-09-10: qty 1

## 2020-09-10 MED ORDER — AMIODARONE HCL IN DEXTROSE 360-4.14 MG/200ML-% IV SOLN
INTRAVENOUS | Status: AC
  Filled 2020-09-10: qty 200

## 2020-09-10 MED ORDER — INSULIN ASPART 100 UNIT/ML IJ SOLN
10.0000 [IU] | Freq: Once | INTRAMUSCULAR | Status: AC
  Filled 2020-09-10: qty 0.1

## 2020-09-10 MED ORDER — ICOSAPENT ETHYL 1 G PO CAPS
2.0000 g | ORAL_CAPSULE | Freq: Two times a day (BID) | ORAL | Status: DC
  Administered 2020-09-10 – 2020-09-11 (×2): 2 g via ORAL
  Filled 2020-09-10 (×3): qty 2

## 2020-09-10 MED ORDER — LABETALOL HCL 5 MG/ML IV SOLN
INTRAVENOUS | Status: AC
  Filled 2020-09-10: qty 4

## 2020-09-10 MED ORDER — APIXABAN 5 MG PO TABS
5.0000 mg | ORAL_TABLET | Freq: Two times a day (BID) | ORAL | Status: DC
  Administered 2020-09-10 – 2020-09-11 (×2): 5 mg via ORAL
  Filled 2020-09-10 (×2): qty 1

## 2020-09-10 MED ORDER — AMIODARONE HCL IN DEXTROSE 360-4.14 MG/200ML-% IV SOLN
INTRAVENOUS | Status: AC | PRN
  Administered 2020-09-10: 60 mg/h via INTRAVENOUS

## 2020-09-10 MED ORDER — INSULIN ASPART 100 UNIT/ML IJ SOLN: 0.0000 [IU] | Freq: Every day | INTRAMUSCULAR | Status: DC

## 2020-09-10 MED ORDER — AMIODARONE HCL IN DEXTROSE 360-4.14 MG/200ML-% IV SOLN
60.0000 mg/h | INTRAVENOUS | Status: DC
  Filled 2020-09-10: qty 200

## 2020-09-10 MED ORDER — LATANOPROST 0.005 % OP SOLN
1.0000 [drp] | Freq: Every day | OPHTHALMIC | Status: DC
  Administered 2020-09-10: 1 [drp] via OPHTHALMIC
  Filled 2020-09-10: qty 2.5

## 2020-09-10 MED ORDER — DAPAGLIFLOZIN PROPANEDIOL 10 MG PO TABS
10.0000 mg | ORAL_TABLET | Freq: Every day | ORAL | Status: DC
  Administered 2020-09-11: 10 mg via ORAL
  Filled 2020-09-10: qty 1

## 2020-09-10 MED ORDER — INSULIN ASPART 100 UNIT/ML IJ SOLN
INTRAMUSCULAR | Status: AC
  Administered 2020-09-10: 10 [IU] via SUBCUTANEOUS
  Filled 2020-09-10: qty 1

## 2020-09-10 MED ORDER — FENOFIBRATE 54 MG PO TABS
54.0000 mg | ORAL_TABLET | Freq: Every day | ORAL | Status: DC
  Administered 2020-09-10 – 2020-09-11 (×2): 54 mg via ORAL
  Filled 2020-09-10 (×2): qty 1

## 2020-09-10 MED ORDER — ONDANSETRON HCL 4 MG/2ML IJ SOLN: 4.0000 mg | Freq: Four times a day (QID) | INTRAMUSCULAR | Status: DC | PRN

## 2020-09-10 MED ORDER — LABETALOL HCL 5 MG/ML IV SOLN
10.0000 mg | INTRAVENOUS | Status: DC | PRN
  Administered 2020-09-10: 10 mg via INTRAVENOUS

## 2020-09-10 MED ORDER — INSULIN ASPART 100 UNIT/ML IJ SOLN
0.0000 [IU] | Freq: Three times a day (TID) | INTRAMUSCULAR | Status: DC
  Administered 2020-09-11: 2 [IU] via SUBCUTANEOUS

## 2020-09-10 MED ORDER — INSULIN GLARGINE 100 UNIT/ML ~~LOC~~ SOLN
24.0000 [IU] | Freq: Every morning | SUBCUTANEOUS | Status: DC
  Filled 2020-09-10: qty 0.24

## 2020-09-10 MED ORDER — ONDANSETRON HCL 4 MG/2ML IJ SOLN
4.0000 mg | Freq: Four times a day (QID) | INTRAMUSCULAR | Status: DC | PRN
  Administered 2020-09-10: 4 mg via INTRAVENOUS
  Filled 2020-09-10: qty 2

## 2020-09-10 MED ORDER — FENTANYL CITRATE (PF) 100 MCG/2ML IJ SOLN
INTRAMUSCULAR | Status: DC | PRN
  Administered 2020-09-10: 25 ug via INTRAVENOUS

## 2020-09-10 MED ORDER — AMIODARONE HCL IN DEXTROSE 360-4.14 MG/200ML-% IV SOLN
30.0000 mg/h | INTRAVENOUS | Status: DC
  Administered 2020-09-10 – 2020-09-11 (×2): 30 mg/h via INTRAVENOUS
  Filled 2020-09-10: qty 200

## 2020-09-10 MED ORDER — LIDOCAINE HCL (PF) 1 % IJ SOLN
INTRAMUSCULAR | Status: AC
  Filled 2020-09-10: qty 30

## 2020-09-10 MED ORDER — SODIUM CHLORIDE 0.9 % IV BOLUS
250.0000 mL | Freq: Once | INTRAVENOUS | Status: AC
  Administered 2020-09-10: 250 mL via INTRAVENOUS

## 2020-09-10 MED ORDER — ATORVASTATIN CALCIUM 80 MG PO TABS
80.0000 mg | ORAL_TABLET | Freq: Every day | ORAL | Status: DC
  Administered 2020-09-10: 80 mg via ORAL
  Filled 2020-09-10: qty 1

## 2020-09-10 MED ORDER — CARVEDILOL 25 MG PO TABS: 25.0000 mg | ORAL_TABLET | Freq: Two times a day (BID) | ORAL | Status: DC

## 2020-09-10 MED ORDER — HYDRALAZINE HCL 100 MG PO TABS: 100.0000 mg | ORAL_TABLET | Freq: Three times a day (TID) | ORAL | Status: DC

## 2020-09-10 MED ORDER — INSULIN GLARGINE 100 UNIT/ML ~~LOC~~ SOLN
24.0000 [IU] | Freq: Every morning | SUBCUTANEOUS | Status: DC
  Administered 2020-09-11: 24 [IU] via SUBCUTANEOUS
  Filled 2020-09-10: qty 0.24

## 2020-09-10 MED ORDER — CARVEDILOL 12.5 MG PO TABS
12.5000 mg | ORAL_TABLET | Freq: Two times a day (BID) | ORAL | Status: DC
  Administered 2020-09-10: 12.5 mg via ORAL
  Filled 2020-09-10: qty 1

## 2020-09-10 MED ORDER — FUROSEMIDE 40 MG PO TABS: 60.0000 mg | ORAL_TABLET | Freq: Every morning | ORAL | Status: DC

## 2020-09-10 MED ORDER — HEPARIN (PORCINE) IN NACL 1000-0.9 UT/500ML-% IV SOLN
INTRAVENOUS | Status: DC | PRN
  Administered 2020-09-10 (×2): 500 mL

## 2020-09-10 MED ORDER — AMIODARONE LOAD VIA INFUSION
INTRAVENOUS | Status: DC | PRN
  Administered 2020-09-10: 150 mg via INTRAVENOUS

## 2020-09-10 MED ORDER — MIDAZOLAM HCL 2 MG/2ML IJ SOLN
INTRAMUSCULAR | Status: AC
  Filled 2020-09-10: qty 2

## 2020-09-10 SURGICAL SUPPLY — 9 items
CARDIOMEMS PA SENSOR W/DELIVER (Prosthesis & Implant Heart) ×2 IMPLANT
CATH SWAN GANZ 7F STRAIGHT (CATHETERS) ×2 IMPLANT
PACK CARDIAC CATHETERIZATION (CUSTOM PROCEDURE TRAY) ×2 IMPLANT
SENSOR CARDIOMEMS PA W/DELIVER (Prosthesis & Implant Heart) ×1 IMPLANT
SHEATH FAST CATH 12F 12CM (SHEATH) ×2 IMPLANT
SHEATH PINNACLE 7F 10CM (SHEATH) ×2 IMPLANT
SHEATH PINNACLE 9F 10CM (SHEATH) ×2 IMPLANT
WIRE EMERALD 3MM-J .025X260CM (WIRE) ×2 IMPLANT
WIRE NITREX .018X300 STIFF (WIRE) ×2 IMPLANT

## 2020-09-10 NOTE — Interval H&P Note (Signed)
History and Physical Interval Note:  09/10/2020 1:27 PM  Troy Rush  has presented today for surgery, with the diagnosis of heart failure.  The various methods of treatment have been discussed with the patient and family. After consideration of risks, benefits and other options for treatment, the patient has consented to  Procedure(s): PRESSURE SENSOR/CARDIOMEMS (N/A) as a surgical intervention.  The patient's history has been reviewed, patient examined, no change in status, stable for surgery.  I have reviewed the patient's chart and labs.  Questions were answered to the patient's satisfaction.     Ireoluwa Gorsline [employer]

## 2020-09-10 NOTE — Progress Notes (Signed)
Patient became hypotensive after getting his daily meds (at reduced doses).   He says that he takes all his meds at home but "sometimes gets lightheaded."   - Stop hydralazine and Coreg for now.  Start back on lower doses in future.  - Will give 250 cc bolus NS.  - Continue amiodarone.   Loralie Champagne 09/10/2020 6:07 PM

## 2020-09-10 NOTE — Progress Notes (Signed)
Site area: Right groin a 12 french venous sheath was removed  Site Prior to Removal:  Level 0  Pressure Applied For 20 MINUTES    Bedrest Beginning at 1515pm   Manual:   Yes.    Patient Status During Pull:  stable  Post Pull Groin Site:  Level 0  Post Pull Instructions Given:  Yes.    Post Pull Pulses Present:  Yes.    Dressing Applied:  Yes.    Comments:

## 2020-09-10 NOTE — H&P (Addendum)
Advanced Heart Failure Team History and Physical Note   PCP:  Pcp, No  PCP-Cardiology: Loralie Champagne, MD     Reason for Admission: Atrial fibrillation with RVR   HPI:    Troy Rush is a 50 year old with a history of with deafness, HTN, DM, and chronic systolic HF.   Patient had no known cardiac problems until 10/16.  At that time, he developed exertional dyspnea and was admitted to a hospital in [place], [place] with acute systolic CHF.  Echo 10/16 showed EF 15-20%.  Cardiac cath showed no significant coronary disease.  He was diuresed and started on cardiac meds. Subsequently, he moved to [facility].  Now working in a [employer].  Repeat echo in 5/18 showed persistently low EF, 25-30%.  He had [place] placed.   He was admitted briefly with CHF exacerbation in 8/20.    Echo in 9/20 showed EF 30-35% with diffuse hypokinesis.   Ultrasound in 4/21 showed right leg DVT and CT showed a PE.  No obvious trigger (no long trip, no surgery, no prolonged immobility).  He was started on apixaban.   He quit taking his meds in 8/21 and was drinking heavily due to depression, later restarted meds.  In 10/21, he was admitted with DKA and atrial fibrillation/RVR (1st known episode).  He converted to NSR spontaneously.  [name] was stopped due to AKI.  He had melena/hematochezia and EGD showed gastritis/esophagitis.  Apixaban was stopped.  He went home but then was re-admitted later in 10/21 with bilateral PEs.   Echo in 10/21 showed EF 45%, mild global hypokinesis, moderate LVH, mildly decreased RV systolic function.   On 05/05/20, he had a near-syncopal event and went to the ER, no events on ICD interrogation.  ?Orthostatic.  No further dizziness/lightheadedness. He is working at [employer] in [employer].  Able to do his job without dyspnea, but he gets short of breath after walking about 50 feet.  Sleepy during the day. Sleep study ordered.  Repeat RHC (2/22) showed optimized filling  pressures, mild pulmonary hypertension, preserved cardiac output. Suspect elevated PA pressure may be related to OSA.   Admitted [id number] with A/C systolic heart failure. Diuresed with IV lasix and transitioned to lasix 60 mg daily. Discharge weight 232 pounds.   He was set up for Cardiomems pressure sensor placement.  He reports that he has been feeling "fine," no dyspnea with normal activities.  However, he was in atrial fibrillation with RVR today with rate in 140s.  Last ECG in 5/30 showed NSR.  BP runs low at times at home and he gets lightheaded.   Cardiomems was placed successfully, RHC as below.  Patient admitted for management of afib/RVR.   RHC Procedural Findings: Hemodynamics (mmHg) RA mean 8 RV 30/9 PA 37/14, mean 25 PCWP mean 9 Oxygen saturations: PA 53% AO 100% Cardiac Output (Fick) 4.1  Cardiac Index (Fick) 1.93 Cardiac Output (Thermo) 5.75 Cardiac Index (Thermo) 2.7  PVR 2.8 WU  Review of Systems: All systems reviewed and negative except as per HPI.   Home Medications Prior to Admission medications   Medication Sig Start Date End Date Taking? Authorizing Provider  atorvastatin (LIPITOR) 80 MG tablet Take 1 tablet (80 mg total) by mouth at bedtime. 03/18/20  Yes Larey Dresser, MD  carvedilol (COREG) 25 MG tablet Take 1 tablet (25 mg total) by mouth 2 (two) times daily with a meal. 04/15/20  Yes Larey Dresser, MD  dapagliflozin propanediol [place])  10 MG TABS tablet Take 1 tablet (10 mg total) by mouth daily before breakfast. 04/16/20  Yes [place], Maricela Bo, FNP  dicyclomine (BENTYL) 20 MG tablet Take 1 tablet (20 mg total) by mouth 2 (two) times daily. 06/23/20  Yes Marcello Fennel, PA-C  ELIQUIS 5 MG TABS tablet TAKE 1 TABLET(5 MG) BY MOUTH TWICE DAILY 09/09/20  Yes Larey Dresser, MD  fenofibrate 54 MG tablet TAKE 1 TABLET (54 MG TOTAL) BY MOUTH DAILY. Patient taking differently: Take 54 mg by mouth in the morning. 07/06/20 10/04/20 Yes Larey Dresser, MD   furosemide (LASIX) 40 MG tablet Take 60 mg by mouth in the morning.   Yes [provider]  hydrALAZINE (APRESOLINE) 100 MG tablet TAKE 1 TABLET (100 MG TOTAL) BY MOUTH 3 (THREE) TIMES DAILY. 08/14/20  Yes Bensimhon, Shaune Pascal, MD  insulin glargine (LANTUS) 100 UNIT/ML injection Inject 0.24 mLs (24 Units total) into the skin daily. Patient taking differently: Inject 24 Units into the skin in the morning. 02/04/20  Yes Sheikh, Omair Latif, DO  isosorbide mononitrate (IMDUR) 60 MG 24 hr tablet Take 1 tablet (60 mg total) by mouth daily. Patient taking differently: Take 60 mg by mouth in the morning. 04/09/20  Yes Larey Dresser, MD  Lancets MISC 1 each by Does not apply route 3 (three) times daily as needed. 03/11/20  Yes [name], FNP  latanoprost (XALATAN) 0.005 % ophthalmic solution Place 1 drop into both eyes at bedtime. 01/20/20  Yes [provider]  meclizine (ANTIVERT) 25 MG tablet Take 1 tablet (25 mg total) by mouth 3 (three) times daily as needed for dizziness. Patient taking differently: Take 25 mg by mouth 3 (three) times daily. 07/14/20  Yes [name], FNP  pantoprazole (PROTONIX) 40 MG tablet Take 1 tablet (40 mg total) by mouth 2 (two) times daily. 03/25/20  Yes [name], FNP  VASCEPA 1 g capsule TAKE TWO CAPSULES BY MOUTH TWICE A DAY Patient taking differently: Take 2 g by mouth in the morning and at bedtime. 07/06/20  Yes Larey Dresser, MD  furosemide (LASIX) 20 MG tablet Take 3 tablets (60 mg total) by mouth daily. Patient not taking: Reported on 09/08/2020 07/10/20 08/09/20  Arrien, Jimmy Picket, MD    Past Medical History: 1. HTN 2. Diabetes 3. Hyperlipidemia 4. Deafness since age 25: Needs sign language interpreter.  5. Chronic systolic CHF: Nonischemic cardiomyopathy.  Diagnosis 10/16 in [place], [place] (admitted with acute systolic CHF).  SPEP and HIV negative.  [place].  - Echo (10/16): EF 15-20%, mild LVH, mild Troy,  normal RV size and systolic function.  - LHC (10/16): No significant CAD.  - CPX (12/17): Mild functional limitation.  - 04/2016 CMRI: EF 27%. RV normal. No evidence of infiltrative disease, myocarditis, or MI.  - Echo (5/18): EF 25-30%, mild LV dilation, mild LVH, normal RV size with mildly decreased - Echo (9/20): EF 30-35%, mild LVH, diffuse hypokinesis, normal RV size and systolic function.  - Echo (10/21): EF 45%, mild global hypokinesis, moderate LVH, mildly decreased RV systolic function.  - RHC (2/22): Optimized filling pressures, mild pulmonary hypertension, preserved cardiac output. Continue current diuretics. Suspect elevated PA pressure may be related to OSA.  6. Depression. 7. Colitis episodes.  8. CKD stage 3.  9. DVT/PE in 4/21: No obvious trigger.  - Recurrent PE in 10/21, had been off anticoagulation.  10. Atrial fibrillation: Paroxysmal.  11. Gastritis/esophagitis: 10/21.  12. ETOH abuse  Past Surgical History: Past Surgical History:  Procedure Laterality Date  . BACK SURGERY    . CARDIAC CATHETERIZATION    . ESOPHAGOGASTRODUODENOSCOPY (EGD) WITH PROPOFOL N/A 02/02/2020   Procedure: ESOPHAGOGASTRODUODENOSCOPY (EGD) WITH PROPOFOL;  Surgeon: Arta Silence, MD;  Location: [facility];  Service: Endoscopy;  Laterality: N/A;  . ICD IMPLANT  10/14/2016  . ICD IMPLANT N/A 10/14/2016   Procedure: ICD Implant;  Surgeon: Deboraha Sprang, MD;  Location: [place] CV LAB;  Service: Cardiovascular;  Laterality: N/A;  . RIGHT HEART CATH N/A 05/21/2020   Procedure: RIGHT HEART CATH;  Surgeon: Larey Dresser, MD;  Location: [place] CV LAB;  Service: Cardiovascular;  Laterality: N/A;  . WRIST SURGERY      Family History:  Family History  Problem Relation Age of Onset  . Hypertension Mother   . Healthy Father   . Stroke Father     Social History: Social History   Socioeconomic History  . Marital status: Legally Separated    Spouse name: Not on file  . Number of  children: Not on file  . Years of education: Not on file  . Highest education level: Not on file  Occupational History  . Not on file  Tobacco Use  . Smoking status: Never Smoker  . Smokeless tobacco: Never Used  Vaping Use  . Vaping Use: Never used  Substance and Sexual Activity  . Alcohol use: Yes    Alcohol/week: 1.0 - 2.0 standard drink    Types: 1 - 2 Glasses of wine per week    Comment: rare  . Drug use: No  . Sexual activity: Yes  Other Topics Concern  . Not on file  Social History Narrative  . Not on file   Social Determinants of Health   Financial Resource Strain: Medium Risk  . Difficulty of Paying Living Expenses: Somewhat hard  Food Insecurity: No Food Insecurity  . Worried About Charity fundraiser in the Last Year: Never true  . Ran Out of Food in the Last Year: Never true  Transportation Needs: No Transportation Needs  . Lack of Transportation (Medical): No  . Lack of Transportation (Non-Medical): No  Physical Activity: Not on file  Stress: Not on file  Social Connections: Not on file    Allergies:  Allergies  Allergen Reactions  . Lisinopril Cough    Objective:    Vital Signs:   Temp:  [98.1 F (36.7 C)] 98.1 F (36.7 C) (06/02 1025) Pulse Rate:  [111-223] 223 (06/02 1429) Resp:  [4-126] 4 (06/02 1429) BP: (86-131)/(57-95) 102/58 (06/02 1419) SpO2:  [0 %-100 %] 0 % (06/02 1429) Weight:  [95.3 kg] 95.3 kg (06/02 1025)   Filed Weights   09/10/20 1025  Weight: 95.3 kg     Physical Exam     General:  Well appearing. No respiratory difficulty HEENT: Normal Neck: Supple. JVP 8 cm. Carotids 2+ bilat; no bruits. No lymphadenopathy or thyromegaly appreciated. Cor: PMI nondisplaced. Regular rate & rhythm. No rubs, gallops or murmurs. Lungs: Clear Abdomen: Soft, nontender, nondistended. No hepatosplenomegaly. No bruits or masses. Good bowel sounds. Extremities: No cyanosis, clubbing, rash, edema Neuro: Alert & oriented x 3, cranial nerves  grossly intact. moves all 4 extremities w/o difficulty. Affect pleasant.   Telemetry   Atrial fibrillation rate 140s (personally reviewed).   EKG   Atrial fibrillation with RVR rate 145 (personally reviewed)  Labs     Basic Metabolic Panel: Recent Labs  Lab 09/07/20 1440  NA  136  K 3.9  CL 101  CO2 27  GLUCOSE 139*  BUN 31*  CREATININE 2.79*  CALCIUM 9.2    Liver Function Tests: No results for input(s): AST, ALT, ALKPHOS, BILITOT, PROT, ALBUMIN in the last 168 hours. No results for input(s): LIPASE, AMYLASE in the last 168 hours. No results for input(s): AMMONIA in the last 168 hours.  CBC: Recent Labs  Lab 09/07/20 1440  WBC 4.4  NEUTROABS 2.7  HGB 10.8*  HCT 33.2*  MCV 89.2  PLT 215    Cardiac Enzymes: No results for input(s): CKTOTAL, CKMB, CKMBINDEX, TROPONINI in the last 168 hours.  BNP: BNP (last 3 results) Recent Labs    05/15/20 1133 06/23/20 1718 07/07/20 1335  BNP 93.5 333.7* 336.3*    ProBNP (last 3 results) No results for input(s): PROBNP in the last 8760 hours.   CBG: Recent Labs  Lab 09/10/20 1030 09/10/20 1259  GLUCAP 348* 283*    Coagulation Studies: No results for input(s): LABPROT, INR in the last 72 hours.  Imaging: CARDIAC CATHETERIZATION  Result Date: 09/10/2020 Successful Cardiomems pressure sensor placement.      Assessment/Plan   1. Chronic systolic CHF: Nonischemic cardiomyopathy, etiology uncertain. Consider prior ETOH or HTN.  He has a [place].  cMRI in 1/18 with EF 27%, RV normal, no evidence of infiltrative disease, myocarditis, or MI. Echo in 9/20 showed EF 30-35%.  Echo in 10/21 showed EF up to 45% with mild RV dysfunction. Creatinine has been trending up generally. NYHA class II by report, Not significantly volume overloaded on exam or RHC.  BP runs low at times at home.  - Continue lasix 60 mg daily.  - Continue Farxiga 10 mg daily.  - Decrease Coreg to 12.5 mg bid with occasional low  BP.      - Continue Imdur 60 mg daily, decrease hydralazine to 75 mg tid with occasional low BP. - Off Entresto and spironolactone with elevated creatinine.  - Cardiomems placed.   - Will get echo.  2. Type II diabetes: management per PCP. Continue Lantus. He does not check his blood sugars at home. - SSI in hospital.  3. HTN: BP low at times with dizziness, decreasing Coreg and hydralazine as above, follow BP while in hospital.  4. Deafness: Needs sign language interpreter.  5. CKD: Stage 3.  Last creatinine 2.79, this is baseline.  - Check BMET today. 6. PE/DVT: 4/21.  Unclear cause, no prolonged trip, prolonged immobility, or history of malignancy. No FH clotting. Recurrent PE in 10/21 while Eliquis on hold with bleeding from esophagitis/gastritis.  Do no think this was an Eliquis failure.  - Continue apixaban.  7. Hyperlipidemia: Continue atorvastatin, good lipids 1/22.  8. Atrial fibrillation: First noted in setting of DKA in 10/21.  He was taken off amiodarone.  He went back into atrial fibrillation/RVR some time after 5/30, HR 140s today.  - I will start amiodarone gtt to control rate and ultimately help keep him in NSR.   - Restart Eliquis tonight.  - Will need TEE-guided DCCV (would have to be Monday) after restarting Eliquis (held for Cardiomems) if he does not convert to NSR on his own.  - Would aim for AF ablation down the road. 9. ETOH abuse: ETOH may have played a role in both cardiomyopathy and atrial fibrillation.  Not drinking currently. 10. Severe OSA: Needs CPAP.   Loralie Champagne, MD 09/10/2020, 3:02 PM  Advanced Heart Failure Team Pager [phone] (M-F; 7a -  5p)  Please contact [facility] for night-coverage after hours (4p -7a ) and weekends on [email or web address]

## 2020-09-11 ENCOUNTER — Other Ambulatory Visit (HOSPITAL_COMMUNITY): Payer: Self-pay

## 2020-09-11 ENCOUNTER — Other Ambulatory Visit (HOSPITAL_COMMUNITY): Payer: Medicare Other

## 2020-09-11 ENCOUNTER — Encounter (HOSPITAL_COMMUNITY): Payer: Self-pay | Admitting: Cardiology

## 2020-09-11 ENCOUNTER — Inpatient Hospital Stay (HOSPITAL_COMMUNITY): Payer: Medicare Other

## 2020-09-11 DIAGNOSIS — U071 COVID-19: Secondary | ICD-10-CM | POA: Diagnosis not present

## 2020-09-11 DIAGNOSIS — I4891 Unspecified atrial fibrillation: Secondary | ICD-10-CM | POA: Diagnosis not present

## 2020-09-11 DIAGNOSIS — I5022 Chronic systolic (congestive) heart failure: Secondary | ICD-10-CM | POA: Diagnosis not present

## 2020-09-11 DIAGNOSIS — I130 Hypertensive heart and chronic kidney disease with heart failure and stage 1 through stage 4 chronic kidney disease, or unspecified chronic kidney disease: Secondary | ICD-10-CM | POA: Diagnosis not present

## 2020-09-11 DIAGNOSIS — I509 Heart failure, unspecified: Secondary | ICD-10-CM | POA: Diagnosis not present

## 2020-09-11 DIAGNOSIS — J9811 Atelectasis: Secondary | ICD-10-CM | POA: Diagnosis not present

## 2020-09-11 LAB — CBC
HCT: 34.4 % — ABNORMAL LOW (ref 39.0–52.0)
Hemoglobin: 10.7 g/dL — ABNORMAL LOW (ref 13.0–17.0)
MCH: 29.1 pg (ref 26.0–34.0)
MCHC: 31.1 g/dL (ref 30.0–36.0)
MCV: 93.5 fL (ref 80.0–100.0)
Platelets: 234 10*3/uL (ref 150–400)
RBC: 3.68 MIL/uL — ABNORMAL LOW (ref 4.22–5.81)
RDW: 13.9 % (ref 11.5–15.5)
WBC: 6.4 10*3/uL (ref 4.0–10.5)
nRBC: 0 % (ref 0.0–0.2)

## 2020-09-11 LAB — GLUCOSE, CAPILLARY: Glucose-Capillary: 133 mg/dL — ABNORMAL HIGH (ref 70–99)

## 2020-09-11 LAB — BASIC METABOLIC PANEL
Anion gap: 10 (ref 5–15)
BUN: 35 mg/dL — ABNORMAL HIGH (ref 6–20)
CO2: 31 mmol/L (ref 22–32)
Calcium: 9.1 mg/dL (ref 8.9–10.3)
Chloride: 99 mmol/L (ref 98–111)
Creatinine, Ser: 3.15 mg/dL — ABNORMAL HIGH (ref 0.61–1.24)
GFR, Estimated: 23 mL/min — ABNORMAL LOW (ref >60)
Glucose, Bld: 181 mg/dL — ABNORMAL HIGH (ref 70–99)
Potassium: 5.2 mmol/L — ABNORMAL HIGH (ref 3.5–5.1)
Sodium: 140 mmol/L (ref 135–145)

## 2020-09-11 LAB — HEMOGLOBIN A1C
Hgb A1c MFr Bld: 10.3 % — ABNORMAL HIGH (ref 4.8–5.6)
Mean Plasma Glucose: 249 mg/dL

## 2020-09-11 LAB — SARS CORONAVIRUS 2 (TAT 6-24 HRS): SARS Coronavirus 2: POSITIVE — AB

## 2020-09-11 MED ORDER — AMIODARONE HCL 200 MG PO TABS
200.0000 mg | ORAL_TABLET | Freq: Two times a day (BID) | ORAL | 6 refills | Status: DC
  Filled 2020-09-11: qty 60, 30d supply, fill #0

## 2020-09-11 MED ORDER — HYDRALAZINE HCL 100 MG PO TABS: 50.0000 mg | ORAL_TABLET | Freq: Three times a day (TID) | ORAL | 3 refills | Status: DC

## 2020-09-11 MED ORDER — SODIUM ZIRCONIUM CYCLOSILICATE 5 G PO PACK
5.0000 g | PACK | Freq: Once | ORAL | Status: AC
  Administered 2020-09-11: 5 g via ORAL
  Filled 2020-09-11: qty 1

## 2020-09-11 MED ORDER — AMIODARONE HCL 200 MG PO TABS
200.0000 mg | ORAL_TABLET | Freq: Two times a day (BID) | ORAL | Status: DC
  Administered 2020-09-11: 200 mg via ORAL
  Filled 2020-09-11: qty 1

## 2020-09-11 MED ORDER — FUROSEMIDE 20 MG PO TABS: 40.0000 mg | ORAL_TABLET | Freq: Every day | ORAL | 0 refills | Status: DC

## 2020-09-11 MED ORDER — SODIUM ZIRCONIUM CYCLOSILICATE 10 G PO PACK: 10.0000 g | PACK | Freq: Once | ORAL | Status: DC

## 2020-09-11 MED ORDER — CARVEDILOL 12.5 MG PO TABS
12.5000 mg | ORAL_TABLET | Freq: Two times a day (BID) | ORAL | 11 refills | Status: DC
  Filled 2020-09-11: qty 60, 30d supply, fill #0

## 2020-09-11 NOTE — Progress Notes (Addendum)
Pt became hypotensive (75/62) when sitting on bedside commode trying to have a BM after bedrest complete. HR 118 and O2 sat = 100%. Pt denies dizziness at this time. BP 93/72 upon return to bed. Amiodarone drip had been resumed at reduced rate (16/55m/hr) at 7:15pm.

## 2020-09-11 NOTE — Discharge Instructions (Signed)
Hemoglobin A1C Test= Result was 10.3% Blood Glucose Monitoring, Adult Monitoring your blood sugar (glucose) is an important part of managing your diabetes. Blood glucose monitoring involves checking your blood glucose as often as directed and keeping a log or record of your results over time. Checking your blood glucose regularly and keeping a blood glucose log can:  Help you and your health care provider adjust your diabetes management plan as needed, including your medicines or insulin.  Help you understand how food, exercise, illnesses, and medicines affect your blood glucose.  Let you know what your blood glucose is at any time. You can quickly find out if you have low blood glucose (hypoglycemia) or high blood glucose (hyperglycemia). Your health care provider will set individualized treatment goals for you. Your goals will be based on your age, other medical conditions you have, and how you respond to diabetes treatment. Generally, the goal of treatment is to maintain the following blood glucose levels:  Before meals (preprandial): 80-130 mg/dL (4.4-7.2 mmol/L).  After meals (postprandial): below 180 mg/dL (10 mmol/L).  A1C level: less than 7%. Supplies needed:  Blood glucose meter.  Test strips for your meter. Each meter has its own strips. You must use the strips that came with your meter.  A needle to prick your finger (lancet). Do not use a lancet more than one time.  A device that holds the lancet (lancing device).  A journal or log book to write down your results. How to check your blood glucose Checking your blood glucose 1. Wash your hands for at least 20 seconds with soap and water. 2. Prick the side of your finger (not the tip) with the lancet. Do not use the same finger consecutively. 3. Gently rub the finger until a small drop of blood appears. 4. Follow instructions that come with your meter for inserting the test strip, applying blood to the strip, and using your  blood glucose meter. 5. Write down your result and any notes in your log.   Using alternative sites Some meters allow you to use areas of your body other than your finger (alternative sites) to test your blood. The most common alternative sites are the forearm, the thigh, and the palm of your hand. Alternative sites may not be as accurate as the fingers because blood flow is slower in those areas. This means that the result you get may be delayed, and it may be different from the result that you would get from your finger. Use the finger only, and do not use alternative sites, if:  You think you have hypoglycemia.  You sometimes do not know that your blood glucose is getting low (hypoglycemia unawareness). General tips and recommendations Blood glucose log  Every time you check your blood glucose, write down your result. Also write down any notes about things that may be affecting your blood glucose, such as your diet and exercise for the day. This information can help you and your health care provider: ? Look for patterns in your blood glucose over time. ? Adjust your diabetes management plan as needed.  Check if your meter allows you to download your records to a computer or if there is an app for the meter. Most glucose meters store a record of glucose readings in the meter.   If you have type 1 diabetes:  Check your blood glucose 4 or more times a day if you are on intensive insulin therapy with multiple daily injections (MDI) or if you are  using an insulin pump. Check your blood glucose: ? Before every meal and snack. ? Before bedtime.  Also check your blood glucose: ? If you have symptoms of hypoglycemia. ? After treating low blood glucose. ? Before doing activities that create a risk for injury, like driving or using machinery. ? Before and after exercise. ? Two hours after a meal. ? Occasionally between 2:00 a.m. and 3:00 a.m., as directed.  You may need to check your blood  glucose more often, 6-10 times per day, if: ? You have diabetes that is not well controlled. ? You are ill. ? You have a history of severe hypoglycemia. ? You have hypoglycemia unawareness. If you have type 2 diabetes:  Check your blood glucose 2 or more times a day if you take insulin or other diabetes medicines.  Check your blood glucose 4 or more times a day if you are on intensive insulin therapy. Occasionally, you may also need to check your glucose between 2:00 a.m. and 3:00 a.m., as directed.  Also check your blood glucose: ? Before and after exercise. ? Before doing activities that create a risk for injury, like driving or using machinery.  You may need to check your blood glucose more often if: ? Your medicine is being adjusted. ? Your diabetes is not well controlled. ? You are ill. General tips  Make sure you always have your supplies with you.  After you use a few boxes of test strips, adjust (calibrate) your blood glucose meter by following instructions that came with your meter.  If you have questions or need help, all blood glucose meters have a 24-hour hotline phone number available that you can call. Also contact your health care provider with questions or concerns you may have. Where to find more information  The American Diabetes Association: www.diabetes.org  The Association of Diabetes [employer] & Education Specialists: www.diabeteseducator.org Contact a health care provider if:  Your blood glucose is at or above 240 mg/dL (13.3 mmol/L) for 2 days in a row.  You have been sick or have had a fever for 2 days or longer, and you are not getting better.  You have any of the following problems for more than 6 hours: ? You cannot eat or drink. ? You have nausea or vomiting. ? You have diarrhea. Get help right away if:  Your blood glucose is lower than 54 mg/dL (3 mmol/L).  You become confused, or you have trouble thinking clearly.  You have difficulty  breathing.  You have moderate or large ketone levels in your urine. These symptoms may represent a serious problem that is an emergency. Do not wait to see if the symptoms will go away. Get medical help right away. Call your local emergency services (911 in the [place] Do not drive yourself to the hospital. Summary  Monitoring your blood glucose is an important part of managing your diabetes.  Blood glucose monitoring involves checking your blood glucose as often as directed and keeping a log or record of your results over time.  Your health care provider will set individualized treatment goals for you. Your goals will be based on your age, other medical conditions you have, and how you respond to diabetes treatment.  Every time you check your blood glucose, write down your result. Also, write down any notes about things that may be affecting your blood glucose, such as your diet and exercise for the day. This information is not intended to replace advice given to you by  your health care provider. Make sure you discuss any questions you have with your health care provider. Document Revised: 12/25/2019 Document Reviewed: 12/25/2019 Elsevier Patient Education  2021 [place].  Hyperglycemia Hyperglycemia occurs when the level of sugar (glucose) in the blood is too high. Glucose is a type of sugar that provides the body's main source of energy. Certain hormones (insulin and glucagon) control the level of glucose in the blood. Insulin lowers blood glucose, and glucagon increases blood glucose. Hyperglycemia can result from not having enough insulin in the bloodstream, or from the body not responding normally to insulin. Hyperglycemia occurs most often in people who have diabetes (diabetes mellitus), but it can happen in people who do not have diabetes. It can develop quickly, and it can be life-threatening if it causes you to become severely dehydrated (diabetic ketoacidosis or hyperglycemic  hyperosmolar state). Severe hyperglycemia is a medical emergency. For most people with diabetes, a blood glucose level above 240 mg/dL is considered hyperglycemia. What are the causes? If you have diabetes, hyperglycemia may be caused by:  Medicines that increase blood glucose or affect your diabetes control.  Getting less physical activity.  Eating more than planned.  Being sick or injured, having an infection, or having surgery.  Stress.  Not giving yourself enough insulin (if you are taking insulin). If you have undiagnosed diabetes, this may be the reason you have hyperglycemia. If you do not have diabetes, hyperglycemia may be caused by:  Certain medicines, including: ? Steroid medicines. ? Beta-blockers. ? Epinephrine. ? Thiazide diuretics.  Stress.  Having a serious illness, an infection, or surgery.  Diseases of the pancreas. What increases the risk? Hyperglycemia is more likely to develop in people who have risk factors for diabetes, such as:  Having a family member with diabetes.  Certain conditions in which the body's disease-fighting system (immune system) attacks itself (autoimmune disorders).  Being overweight or obese.  Having an inactive (sedentary) lifestyle.  Having been diagnosed with insulin resistance.  Having a history of prediabetes, gestational diabetes, or polycystic ovarian syndrome (PCOS). What are the signs or symptoms? Hyperglycemia may not cause any symptoms. If you do have symptoms, they may include:  Increased thirst.  Needing to urinate more often than usual.  Hunger.  Feeling very tired.  Blurry vision. Other symptoms may develop if hyperglycemia gets worse, such as:  Dry mouth.  Abdominal pain.  Loss of appetite.  Fruity-smelling breath.  Weakness.  Unexpected weight loss.  Tingling or numbness in the hands or feet.  Headache.  Cuts or bruises that are slow to heal. How is this diagnosed? Hyperglycemia is  diagnosed with a blood test to measure your blood glucose level. This blood test is usually done while you are having symptoms. Your health care provider may also do a physical exam and review your medical history. You may have more tests to determine the cause of your hyperglycemia, such as:  A fasting blood glucose (FBG) test. You will not be allowed to eat (you will fast) for at least 8 hours before a blood sample is taken.  An A1C blood test. This provides information about blood glucose control over the previous 2-3 months.  An oral glucose tolerance test (OGTT). This measures your blood glucose at two times: ? After fasting. This is your baseline blood glucose level. ? 2 hours after drinking a beverage that contains glucose. How is this treated? Treatment depends on the cause of your hyperglycemia. Treatment may include:  Taking medicine to  regulate your blood glucose levels. If you take insulin or other diabetes medicines, your medicine or dosage may be adjusted.  Lifestyle changes, such as exercising more, eating healthier foods, or losing weight.  Treating an illness or infection.  Checking your blood glucose more often.  Stopping or reducing steroid medicines. If your hyperglycemia becomes severe and it results in diabetic ketoacidosis or hyperglycemic hyperosmolar state, you must be hospitalized and given IV fluids and IV insulin. Follow these instructions at home: General instructions  Take over-the-counter and prescription medicines only as told by your health care provider.  Do not use any products that contain nicotine or tobacco. These products include cigarettes, chewing tobacco, and vaping devices, such as e-cigarettes. If you need help quitting, ask your health care provider.  If you drink alcohol: ? Limit how much you have to:  0-1 drink a day for women who are not pregnant.  0-2 drinks a day for men. ? Know how much alcohol is in a drink. In the [place] one  drink equals one 12 oz bottle of beer (355 mL), one 5 oz glass of wine (148 mL), or one 1 oz glass of hard liquor (44 mL).  Learn to manage stress. If you need help with this, ask your health care provider.  Do exercises as told by your health care provider.  Keep all follow-up visits. This is important. Eating and drinking  Maintain a healthy weight.  Stay hydrated, especially when you exercise, get sick, or spend time in hot temperatures.  Drink enough fluid to keep your urine pale yellow.   If you have diabetes:  Know the symptoms of hyperglycemia.  Follow your diabetes management plan as told by your health care provider. Make sure you: ? Take your insulin and medicines as told. ? Follow your exercise plan. ? Follow your meal plan. Eat on time, and do not skip meals. ? Check your blood glucose as often as told. Make sure to check your blood glucose before and after exercise. If you exercise longer or in a different way, check your blood glucose more often. ? Follow your sick day plan whenever you cannot eat or drink normally. Make this plan in advance with your health care provider.  Share your diabetes management plan with people in your workplace, school, and household.  Check your urine for ketones when you are ill and as told by your health care provider.  Carry a medical alert card or wear medical alert jewelry.   Where to find more information American Diabetes Association: www.diabetes.org Contact a health care provider if:  Your blood glucose is at or above 240 mg/dL (13.3 mmol/L) for 2 days in a row.  You have problems keeping your blood glucose in your target range.  You have frequent episodes of hyperglycemia.  You have signs of illness, such as nausea, vomiting, or fever. Get help right away if:  Your blood glucose monitor reads "high" even when you are taking insulin.  You have trouble breathing.  You have a change in how you think, feel, or act  (mental status).  You have nausea or vomiting that does not go away. These symptoms may represent a serious problem that is an emergency. Do not wait to see if the symptoms will go away. Get medical help right away. Call your local emergency services (911 in the [place] Do not drive yourself to the hospital. Summary  Hyperglycemia occurs when the level of sugar (glucose) in the blood is too  high.  Hyperglycemia can happen with or without diabetes, and severe hyperglycemia can be life-threatening.  Hyperglycemia is diagnosed with a blood test to measure your blood glucose level. This blood test is usually done while you are having symptoms. Your health care provider may also do a physical exam and review your medical history.  If you have diabetes, follow your diabetes management plan as told by your health care provider.  Contact your health care provider if you have problems keeping your blood glucose in your target range. This information is not intended to replace advice given to you by your health care provider. Make sure you discuss any questions you have with your health care provider. Document Revised: 01/10/2020 Document Reviewed: 01/10/2020 Elsevier Patient Education  2021 [place].  Why am I having this test? You may have the hemoglobin A1C test (A1C test) done to:  Evaluate your risk for developing diabetes (diabetes mellitus).  Diagnose diabetes.  Monitor long-term control of blood sugar (glucose) in people who have diabetes and help make treatment decisions. This test may be done with other blood glucose tests, such as fasting blood glucose and oral glucose tolerance tests. What is being tested? Hemoglobin is a type of protein in the blood that carries oxygen. Glucose attaches to hemoglobin to form glycated hemoglobin. This test checks the amount of glycated hemoglobin in your blood, which is a good indicator of the average amount of glucose in your blood during the past  2-3 months. What kind of sample is taken? A blood sample is required for this test. It is usually collected by inserting a needle into a blood vessel.   Tell a health care provider about:  All medicines you are taking, including vitamins, herbs, eye drops, creams, and over-the-counter medicines.  Any blood disorders you have.  Any surgeries you have had.  Any medical conditions you have.  Whether you are pregnant or may be pregnant. How are the results reported? Your results will be reported as a percentage that indicates how much of your hemoglobin has glucose attached to it (is glycated). Your health care provider will compare your results to normal ranges that were established after testing a large group of people (reference ranges). Reference ranges may vary among labs and hospitals. For this test, common reference ranges are:  Adult or child without diabetes: 4-5.6%.  Adult or child with diabetes and good blood glucose control: less than 7%. What do the results mean? If you have diabetes:  A result of less than 7% is considered normal, meaning that your blood glucose is well controlled.  A result higher than 7% means that your blood glucose is not well controlled, and your treatment plan may need to be adjusted. If you do not have diabetes:  A result within the reference range is considered normal, meaning that you are not at high risk for diabetes.  A result of 5.7-6.4% means that you have a high risk of developing diabetes, and you have prediabetes. Prediabetes is the condition of having a blood glucose level that is higher than it should be but not high enough for you to be diagnosed with diabetes. Having prediabetes puts you at risk for developing type 2 diabetes. You may have more tests, including a repeat A1C test.  Results of 6.5% or higher on two separate A1C tests mean that you have diabetes. You may have more tests to confirm the diagnosis. Abnormally low A1C values  may be caused by:  Pregnancy.  Severe  blood loss.  Receiving donated blood (transfusions).  Low red blood cell count (anemia).  Long-term kidney failure.  Some unusual forms (variants) of hemoglobin. Talk with your health care provider about what your results mean. Questions to ask your health care provider Ask your health care provider, or the department that is doing the test:  When will my results be ready?  How will I get my results?  What are my treatment options?  What other tests do I need?  What are my next steps? Summary  The A1C test may be done to evaluate your risk for developing diabetes, to diagnose diabetes, and to monitor long-term control of blood sugar (glucose) in people who have diabetes and help make treatment decisions.  Hemoglobin is a type of protein in the blood that carries oxygen. Glucose attaches to hemoglobin to form glycated hemoglobin. This test checks the amount of glycated hemoglobin in your blood, which is a good indicator of the average amount of glucose in your blood during the past 2-3 months.  Talk with your health care provider about what your results mean. This information is not intended to replace advice given to you by your health care provider. Make sure you discuss any questions you have with your health care provider. Document Revised: 12/25/2019 Document Reviewed: 12/25/2019 Elsevier Patient Education  2021 [employer].

## 2020-09-11 NOTE — Progress Notes (Addendum)
Advanced Heart Failure Rounding Note  PCP-Cardiologist: Loralie Champagne, MD   Subjective:    Admitted after cardiomems with A fib RV. Started on amio drip. Converted to SR ~ 0100.   SARS2 +   Feels ok.   Objective:   Weight Range: 104.6 kg Body mass index is 33.09 kg/m.   Vital Signs:   Temp:  [98.1 F (36.7 C)-98.3 F (36.8 C)] 98.3 F (36.8 C) (06/03 0323) Pulse Rate:  [36-223] 79 (06/03 0323) Resp:  [4-126] 18 (06/03 0809) BP: (71-181)/(44-147) 163/93 (06/03 0809) SpO2:  [0 %-100 %] 97 % (06/03 0809) Weight:  [95.3 kg-104.6 kg] 104.6 kg (06/03 0145) Last BM Date: 09/09/20  Weight change: Filed Weights   09/10/20 1025 09/11/20 0145  Weight: 95.3 kg 104.6 kg    Intake/Output:   Intake/Output Summary (Last 24 hours) at 09/11/2020 0843 Last data filed at 09/11/2020 [id number] Gross per 24 hour  Intake 1287.15 ml  Output 775 ml  Net 512.15 ml      Physical Exam    General:  Well appearing. No resp difficulty HEENT: Normal Neck: Supple. JVP . Carotids 2+ bilat; no bruits. No lymphadenopathy or thyromegaly appreciated. Cor: PMI nondisplaced. Regular rate & rhythm. No rubs, gallops or murmurs. Lungs: Clear Abdomen: Soft, nontender, nondistended. No hepatosplenomegaly. No bruits or masses. Good bowel sounds. Extremities: No cyanosis, clubbing, rash, edema Neuro: Alert & orientedx3, cranial nerves grossly intact. moves all 4 extremities w/o difficulty. Affect pleasant   Telemetry  In A fib until ~ 0100. Now in SR 80s   EKG   n/a  Labs    CBC Recent Labs    09/10/20 1615 09/11/20 0338  WBC 3.4* 6.4  NEUTROABS 1.3*  --   HGB 11.0* 10.7*  HCT 35.5* 34.4*  MCV 93.7 93.5  PLT 222 [id number]   Basic Metabolic Panel Recent Labs    09/10/20 1615 09/11/20 0338  NA 142 140  K 4.4 5.2*  CL 105 99  CO2 29 31  GLUCOSE 180* 181*  BUN 34* 35*  CREATININE 3.11* 3.15*  CALCIUM 8.9 9.1  MG 2.6*  --    Liver Function Tests Recent Labs    09/10/20 1615  AST  37  ALT 27  ALKPHOS 47  BILITOT 1.0  PROT 6.8  ALBUMIN 3.2*   No results for input(s): LIPASE, AMYLASE in the last 72 hours. Cardiac Enzymes No results for input(s): CKTOTAL, CKMB, CKMBINDEX, TROPONINI in the last 72 hours.  BNP: BNP (last 3 results) Recent Labs    05/15/20 1133 06/23/20 1718 07/07/20 1335  BNP 93.5 333.7* 336.3*    ProBNP (last 3 results) No results for input(s): PROBNP in the last 8760 hours.   D-Dimer No results for input(s): DDIMER in the last 72 hours. Hemoglobin A1C No results for input(s): HGBA1C in the last 72 hours. Fasting Lipid Panel No results for input(s): CHOL, HDL, LDLCALC, TRIG, CHOLHDL, LDLDIRECT in the last 72 hours. Thyroid Function Tests Recent Labs    09/10/20 1615  TSH 1.957    Other results:   Imaging    DG Chest 2 View  Result Date: 09/11/2020 CLINICAL DATA:  CHF EXAM: CHEST - 2 VIEW COMPARISON:  07/07/2020 FINDINGS: AICD unchanged in position. Cardiac enlargement without heart failure. No edema or effusion. Slight atelectasis in the lung bases. IMPRESSION: Mild bibasilar atelectasis.  Negative for heart failure Electronically Signed   By: Franchot Gallo M.D.   On: 09/11/2020 08:10   CARDIAC CATHETERIZATION  Result  Date: 09/10/2020 Successful Cardiomems pressure sensor placement.      Medications:     Scheduled Medications: . amiodarone  150 mg Intravenous Once  . apixaban  5 mg Oral BID  . atorvastatin  80 mg Oral QHS  . dapagliflozin propanediol  10 mg Oral QAC breakfast  . dicyclomine  20 mg Oral BID  . fenofibrate  54 mg Oral Daily  . furosemide  40 mg Oral q AM  . icosapent Ethyl  2 g Oral BID  . insulin aspart  0-15 Units Subcutaneous TID WC  . insulin aspart  0-5 Units Subcutaneous QHS  . insulin glargine  24 Units Subcutaneous q AM  . isosorbide mononitrate  30 mg Oral Daily  . latanoprost  1 drop Both Eyes QHS  . pantoprazole  40 mg Oral BID  . sodium chloride flush  3 mL Intravenous Q12H      Infusions: . sodium chloride    . amiodarone 30 mg/hr (09/11/20 0817)     PRN Medications:  sodium chloride, acetaminophen, ondansetron (ZOFRAN) IV, sodium chloride flush    Assessment/Plan  1. Chronic systolic CHF: Nonischemic cardiomyopathy, etiology uncertain. Consider prior ETOH or HTN. He has a [place]. cMRI in 1/18 with EF 27%, RV normal, no evidence of infiltrative disease, myocarditis, or MI. Echo in 9/20 showed EF 30-35%. Echo in 10/21 showed EF up to 45% with mild RV dysfunction. Creatinine has been trending up generally. NYHA class II by report, Not significantly volume overloaded on exam or RHC.  BP runs low at times at home.  -Continue lasix 40 mg daily. - Continue Farxiga 10 mg daily.  - Restart coreg 12.5 mg twice a day - Restart hydralazine 50 mg three times a day  - Continue Imdur 60 mg daily. - Off Entresto and spironolactone with elevated creatinine.  - Cardiomems placed.   - Can hold off on ECHO.  2. Type II diabetes: management per PCP. Continue Lantus. He does not check his blood sugars at home. - SSI in hospital.  3. HTN:  Was low on admit. - Restart coreg and hydralazine.  4. Deafness: Needs sign language interpreter.  5. CKD: Stage 3.Last prior creatinine 2.79, this is baseline. - Creatinine today 3.15  6. PE/DVT: 4/21. Unclear cause, no prolonged trip, prolonged immobility, or history of malignancy. No FH clotting. Recurrent PE in 10/21 while Eliquis on hold with bleeding from esophagitis/gastritis. Do no think this was an Eliquis failure.  - Continue Eliquis.  7. Hyperlipidemia: Continue atorvastatin, good lipids 1/22.  8. Atrial fibrillation: First noted in setting of DKA in 10/21. He was taken off amiodarone.  He went back into atrial fibrillation/RVR some time after 5/30, HR 140s.  - On amio drip. Now in NSR. Stop  amio drip and start amio 200 mg twice a day.  - On eliquis 5 mg twice a day.  9. ETOH abuse: ETOH may  have played a role in both cardiomyopathy and atrial fibrillation. Not drinking currently. 10.Severe OSA: Needs CPAP.  11. SARS 2+  Not a candidate for any treatment given eliquis use.   Length of Stay: [place], NP  09/11/2020, 8:43 AM  Advanced Heart Failure Team Pager [phone] (M-F; 7a - 5p)  Please contact [facility] for night-coverage after hours (5p -7a ) and weekends on [email or web address]  Patient seen with NP, agree with the above note.   Admitted after Cardiomems placement due to atrial fibrillation with RVR.  He was started on  amiodarone gtt and is back in NSR today.    He was found to have COVID-19 on routine post-admission testing.  No dyspnea, cough, fever.   Hypotensive initially after getting home meds yesterday, meds held and now hypertensive.    General: NAD Neck: No JVD, no thyromegaly or thyroid nodule.  Lungs: Clear to auscultation bilaterally with normal respiratory effort. CV: Nondisplaced PMI.  Heart regular S1/S2, no S3/S4, no murmur.  No peripheral edema.   Abdomen: Soft, nontender, no hepatosplenomegaly, no distention.  Skin: Intact without lesions or rashes.  Neurologic: Alert and oriented x 3.  Psych: Normal affect. Extremities: No clubbing or cyanosis.  HEENT: Normal.   Patient is back in NSR. He will need to restart amiodarone.  Will use 200 mg bid x 2 wks then 200 mg daily.  Continue Eliquis.   I am going to start him back on Coreg at 12.5 mg bid, hydralazine 50 mg tid, and Imdur 60 mg daily (lower doses with occasional hypotensive episodes).  Can decrease Lasix to 40 mg daily.   He seems to be asymptomatic from COVID-19 standpoint.  Cannot get Paxlovid with Eliquis.    Plan discharge today.   Loralie Champagne 09/11/2020 10:17 AM

## 2020-09-11 NOTE — Progress Notes (Signed)
Patient provided with discharge education and materials, expressed understanding. IV access and Telemetry monitor removed, no issues noted. Patient discharged from unit with all belongings.

## 2020-09-11 NOTE — Progress Notes (Signed)
Pt converted to NSR. EKG performed.

## 2020-09-11 NOTE — Care Management Obs Status (Signed)
[place] NOTIFICATION   Patient Details  Name: Troy Rush MRN: [id number] Date of Birth: 01-25-1971   Medicare Observation Status Notification Given:  Yes    [place], [place] 09/11/2020, 12:13 PM

## 2020-09-11 NOTE — Discharge Summary (Signed)
Advanced Heart Failure Team  Discharge Summary   Patient ID: TAEDEN FISTLER MRN: [id number], DOB/AGE: [birth year] 52 y.o. Admit date: 09/10/2020 D/C date:     09/11/2020   Primary Discharge Diagnoses:  A fib RVR  Chronic Systolic Heart Failure  HTN  Deafness CKD Stage IIIb COVID +  H/O PE/DVT Hyperlipidemia ETOH Abuse OSA    Hospital Course:  Mr Vicencio is a 50 year old with a history ofwith deafness, HTN, DM, DVT/PE, [employer] ICD, and chronic systolicHF dating back to 2016.   Presented for scheduled cardiomems and was noted to be in A fib RVR. RHC with succesful cardiomems implant. Stable hemodynamics on cath. Admitted from cath labs with A fib RVR and started on amio drip. Chemically converted to NSR was transitioned to amio 200 mg twice a day.   HF meds adjusted due to hypotension. On admit he was COVID + and fortunately asymptomatic. Not a candidate for additional treatment for COVID because he needed uninterrupted anticoagulation.   Will follow closely in the HF clinic. He was educated on the Cardiomems process for home. At his follow up we will check EKG and if in NSR will taper amio.    Continue HF Paramedicine.   1. Chronic systolic CHF: Nonischemic cardiomyopathy, etiology uncertain. Consider prior ETOH or HTN. He has a [place]. cMRI in 1/18 with EF 27%, RV normal, no evidence of infiltrative disease, myocarditis, or MI. Echo in 9/20 showed EF 30-35%. Echo in 10/21 showed EF up to 45% with mild RV dysfunction. Creatinine has been trending upgenerally.NYHA class II by report, Not significantly volume overloaded on exam or RHC. BP runs low at times at home.  -Continue lasix40 mg daily. - Continue Farxiga 10 mg daily.  -Restart coreg 12.5 mg twice a day (lower dose)  - Restart hydralazine 50 mg three times a day ( lower dose)  - Continue Imdur 60 mg daily. - Off Entresto and spironolactone with elevated creatinine. - Cardiomems placed. - Can  hold off on ECHO.  2. Type II diabetes: management per PCP. Continue Lantus. He does not check his blood sugars at home. - SSI in hospital. 3. HTN: Was low on admit. - Restart coreg and hydralazine at reduced rate.  4. Deafness:Needs sign language interpreter. 5. CKD: Stage 3.Last prior creatinine 2.79, this is baseline. -Creatinine today 3.15  - He will need referral to Nephrology  6. PE/DVT: 4/21. Unclear cause, no prolonged trip, prolonged immobility, or history of malignancy. No FH clotting. Recurrent PE in 10/21 while Eliquis on hold with bleeding from esophagitis/gastritis. Do no think this was an Eliquis failure.  - Continue Eliquis.  7. Hyperlipidemia: Continue atorvastatin, good lipids 1/22.  8. Atrial fibrillation: First noted in setting of DKA in 10/21.He was taken off amiodarone. He went back into atrial fibrillation/RVR some time after 5/30, HR 140s.  - Place on amio drip and chemically converted to NSR.  - D/c on amio 200 mg twice a day and will taper as an outpatient. - On eliquis 5 mg twice a day.  9. ETOH abuse: ETOH may have played a role in both cardiomyopathy and atrial fibrillation. Not drinking currently. 10.Severe [id number] CPAP. 11. SARS 2+  Not a candidate for any treatment given eliquis use.   Discharge Vitals: Blood pressure (!) 163/93, pulse 79, temperature 98.3 F (36.8 C), temperature source Oral, resp. rate 18, height '5\' 10"'$  (1.778 m), weight 104.6 kg, SpO2 97 %.  Labs: Lab Results  Component Value Date  WBC 6.4 09/11/2020   HGB 10.7 (L) 09/11/2020   HCT 34.4 (L) 09/11/2020   MCV 93.5 09/11/2020   PLT 234 09/11/2020    Recent Labs  Lab 09/10/20 1615 09/11/20 0338  NA 142 140  K 4.4 5.2*  CL 105 99  CO2 29 31  BUN 34* 35*  CREATININE 3.11* 3.15*  CALCIUM 8.9 9.1  PROT 6.8  --   BILITOT 1.0  --   ALKPHOS 47  --   ALT 27  --   AST 37  --   GLUCOSE 180* 181*   Lab Results  Component Value Date   CHOL 158 04/30/2020    HDL 38 (L) 04/30/2020   LDLCALC 76 04/30/2020   TRIG 220 (H) 04/30/2020   BNP (last 3 results) Recent Labs    05/15/20 1133 06/23/20 1718 07/07/20 1335  BNP 93.5 333.7* 336.3*    ProBNP (last 3 results) No results for input(s): PROBNP in the last 8760 hours.   Diagnostic Studies/Procedures   DG Chest 2 View  Result Date: 09/11/2020 CLINICAL DATA:  CHF EXAM: CHEST - 2 VIEW COMPARISON:  07/07/2020 FINDINGS: AICD unchanged in position. Cardiac enlargement without heart failure. No edema or effusion. Slight atelectasis in the lung bases. IMPRESSION: Mild bibasilar atelectasis.  Negative for heart failure Electronically Signed   By: Franchot Gallo M.D.   On: 09/11/2020 08:10   CARDIAC CATHETERIZATION  Result Date: 09/10/2020 Successful Cardiomems pressure sensor placement.  RA mean 8 RV 30/9 PA 37/14, mean 25 PCWP mean 9 Oxygen saturations: PA 53% AO 100% Cardiac Output (Fick) 4.1  Cardiac Index (Fick) 1.93 Cardiac Output (Thermo) 5.75 Cardiac Index (Thermo) 2.7  PVR 2.8 WU  Discharge Medications   Allergies as of 09/11/2020      Reactions   Lisinopril Cough      Medication List    TAKE these medications   amiodarone 200 MG tablet Commonly known as: PACERONE Take 1 tablet (200 mg total) by mouth 2 (two) times daily.   atorvastatin 80 MG tablet Commonly known as: LIPITOR Take 1 tablet (80 mg total) by mouth at bedtime.   carvedilol 12.5 MG tablet Commonly known as: Coreg Take 1 tablet (12.5 mg total) by mouth 2 (two) times daily. What changed:   medication strength  how much to take  when to take this   dapagliflozin propanediol 10 MG Tabs tablet Commonly known as: Farxiga Take 1 tablet (10 mg total) by mouth daily before breakfast.   dicyclomine 20 MG tablet Commonly known as: BENTYL Take 1 tablet (20 mg total) by mouth 2 (two) times daily.   Eliquis 5 MG Tabs tablet Generic drug: apixaban TAKE 1 TABLET(5 MG) BY MOUTH TWICE DAILY   fenofibrate 54  MG tablet TAKE 1 TABLET (54 MG TOTAL) BY MOUTH DAILY. What changed: when to take this   furosemide 20 MG tablet Commonly known as: LASIX Take 2 tablets (40 mg total) by mouth daily. What changed:   how much to take  Another medication with the same name was removed. Continue taking this medication, and follow the directions you see here.   hydrALAZINE 100 MG tablet Commonly known as: APRESOLINE Take 0.5 tablets (50 mg total) by mouth 3 (three) times daily. What changed: how much to take   insulin glargine 100 UNIT/ML injection Commonly known as: LANTUS Inject 0.24 mLs (24 Units total) into the skin daily. What changed: when to take this   isosorbide mononitrate 60 MG 24 hr tablet Commonly known  as: IMDUR Take 1 tablet (60 mg total) by mouth daily. What changed: when to take this   Lancets Misc 1 each by Does not apply route 3 (three) times daily as needed.   latanoprost 0.005 % ophthalmic solution Commonly known as: XALATAN Place 1 drop into both eyes at bedtime.   meclizine 25 MG tablet Commonly known as: ANTIVERT Take 1 tablet (25 mg total) by mouth 3 (three) times daily as needed for dizziness. What changed: when to take this   pantoprazole 40 MG tablet Commonly known as: PROTONIX Take 1 tablet (40 mg total) by mouth 2 (two) times daily.   Vascepa 1 g capsule Generic drug: icosapent Ethyl TAKE TWO CAPSULES BY MOUTH TWICE A DAY What changed:   how much to take  when to take this       Disposition   The patient will be discharged in stable condition to home. Discharge Instructions    Diet - low sodium heart healthy   Complete by: As directed    Increase activity slowly   Complete by: As directed       Follow-up Information    [facility].   Specialty: Cardiology Contact information: [address] [id number] [place] [phone]                 Duration of Discharge Encounter: Greater than 35 minutes   Signed, Darrick Grinder NP-C  09/11/2020, 9:15 AM

## 2020-09-11 NOTE — Care Management CC44 (Signed)
Condition Code 44 Documentation Completed  Patient Details  Name: Troy Rush MRN: [id number] Date of Birth: [birth year]   Condition Code 44 given:  Yes Patient signature on Condition Code 44 notice:  Yes Documentation of 2 MD's agreement:  Yes Code 44 added to claim:  Yes    Jamilya Sarrazin, [place] 09/11/2020, 12:10 PM

## 2020-09-11 NOTE — Progress Notes (Signed)
Pt's HR up to 140 while pt ambulating to bathroom. Pt had refused bedside commode.

## 2020-09-16 ENCOUNTER — Other Ambulatory Visit (HOSPITAL_COMMUNITY): Payer: Self-pay

## 2020-09-16 NOTE — Progress Notes (Signed)
Paramedicine Encounter    Patient ID: Troy Rush, male    DOB: [birth year], 50 y.o.   MRN: [id number]   Patient Care Team: Pcp, No as PCP - General Larey Dresser, MD as PCP - Cardiology (Cardiology) Conrad , NP as Nurse Practitioner (Cardiology) Larey Dresser, MD as Consulting Physician (Cardiology) Jorge Ny, LCSW as Social Worker (Licensed Clinical Social Worker)  Patient Active Problem List   Diagnosis Date Noted  . CKD (chronic kidney disease) stage 3, GFR 30-59 ml/min (HCC) 07/08/2020  . Acute CHF (congestive heart failure) ([place]) 07/07/2020  . CKD (chronic kidney disease), stage IV ([place]) 07/07/2020  . ICD (implantable cardioverter-defibrillator) in place 05/14/2020  . Pulmonary embolism ([place]) 02/05/2020  . Acute kidney injury superimposed on CKD ([place])   . Chronic combined systolic and diastolic heart failure ([place])   . Atrial fibrillation with RVR ([place]) 02/01/2020  . NSVT (nonsustained ventricular tachycardia) ([place]) 02/01/2020  . Acute GI bleeding 02/01/2020  . DKA (diabetic ketoacidosis) ([place]) 02/01/2020  . Bilateral deafness 08/15/2019  . Type 2 diabetes mellitus with complication, with long-term current use of insulin ([place]) 08/15/2019  . Hemoglobin A1C between 7% and 9% indicating borderline diabetic control 08/15/2019  . Insomnia 08/15/2019  . Abdominal pain 11/14/2018  . Ischemic cardiomyopathy 10/14/2016  . NICM (nonischemic cardiomyopathy) ([place]) 09/15/2016  . Chronic systolic CHF (congestive heart failure) ([place]) 05/30/2016  . HTN (hypertension) 05/30/2016  . Deaf 05/30/2016  . Hyperkalemia 05/30/2016  . Diabetes ([place]) 03/21/2016  . Major depressive disorder, recurrent episode ([place]) 03/21/2016  . Bronchitis 01/21/2015  . Dyslipidemia 01/21/2015  . Vitamin D deficiency 12/19/2011  . Personal history of other infectious and parasitic diseases 10/25/2011  . Anxiety disorder 05/12/2011  . Hyperlipidemia 05/12/2011  . Hypertriglyceridemia 05/12/2011   . Obstructive sleep apnea 05/12/2011    Current Outpatient Medications:  .  amiodarone (PACERONE) 200 MG tablet, Take 1 tablet (200 mg total) by mouth 2 (two) times daily., Disp: 60 tablet, Rfl: 6 .  atorvastatin (LIPITOR) 80 MG tablet, Take 1 tablet (80 mg total) by mouth at bedtime., Disp: 30 tablet, Rfl: 11 .  carvedilol (COREG) 12.5 MG tablet, Take 1 tablet (12.5 mg total) by mouth 2 (two) times daily., Disp: 60 tablet, Rfl: 11 .  dapagliflozin propanediol (FARXIGA) 10 MG TABS tablet, Take 1 tablet (10 mg total) by mouth daily before breakfast., Disp: 30 tablet, Rfl: 11 .  ELIQUIS 5 MG TABS tablet, TAKE 1 TABLET(5 MG) BY MOUTH TWICE DAILY, Disp: 60 tablet, Rfl: 11 .  fenofibrate 54 MG tablet, TAKE 1 TABLET (54 MG TOTAL) BY MOUTH DAILY. (Patient taking differently: Take 54 mg by mouth in the morning.), Disp: 90 tablet, Rfl: 0 .  furosemide (LASIX) 20 MG tablet, Take 2 tablets (40 mg total) by mouth daily., Disp: 60 tablet, Rfl: 0 .  hydrALAZINE (APRESOLINE) 100 MG tablet, Take 0.5 tablets (50 mg total) by mouth 3 (three) times daily., Disp: 90 tablet, Rfl: 3 .  insulin glargine (LANTUS) 100 UNIT/ML injection, Inject 0.24 mLs (24 Units total) into the skin daily. (Patient taking differently: Inject 24 Units into the skin in the morning.), Disp: 10 mL, Rfl: 11 .  isosorbide mononitrate (IMDUR) 60 MG 24 hr tablet, Take 1 tablet (60 mg total) by mouth daily. (Patient taking differently: Take 60 mg by mouth in the morning.), Disp: 30 tablet, Rfl: 11 .  Lancets MISC, 1 each by Does not apply route 3 (three) times daily as needed., Disp: 300 each,  Rfl: 3 .  latanoprost (XALATAN) 0.005 % ophthalmic solution, Place 1 drop into both eyes at bedtime., Disp: , Rfl:  .  meclizine (ANTIVERT) 25 MG tablet, Take 1 tablet (25 mg total) by mouth 3 (three) times daily as needed for dizziness. (Patient taking differently: Take 25 mg by mouth 3 (three) times daily.), Disp: 90 tablet, Rfl: 3 .  pantoprazole  (PROTONIX) 40 MG tablet, Take 1 tablet (40 mg total) by mouth 2 (two) times daily., Disp: 180 tablet, Rfl: 3 .  VASCEPA 1 g capsule, TAKE TWO CAPSULES BY MOUTH TWICE A DAY (Patient taking differently: Take 2 g by mouth in the morning and at bedtime.), Disp: 120 capsule, Rfl: 5 .  dicyclomine (BENTYL) 20 MG tablet, Take 1 tablet (20 mg total) by mouth 2 (two) times daily. (Patient not taking: Reported on 09/16/2020), Disp: 20 tablet, Rfl: 0 Allergies  Allergen Reactions  . Lisinopril Cough      Social History   Socioeconomic History  . Marital status: Legally Separated    Spouse name: Not on file  . Number of children: Not on file  . Years of education: Not on file  . Highest education level: Not on file  Occupational History  . Not on file  Tobacco Use  . Smoking status: Never Smoker  . Smokeless tobacco: Never Used  Vaping Use  . Vaping Use: Never used  Substance and Sexual Activity  . Alcohol use: Yes    Alcohol/week: 1.0 - 2.0 standard drink    Types: 1 - 2 Glasses of wine per week    Comment: rare  . Drug use: No  . Sexual activity: Yes  Other Topics Concern  . Not on file  Social History Narrative  . Not on file   Social Determinants of Health   Financial Resource Strain: Medium Risk  . Difficulty of Paying Living Expenses: Somewhat hard  Food Insecurity: No Food Insecurity  . Worried About Charity fundraiser in the Last Year: Never true  . Ran Out of Food in the Last Year: Never true  Transportation Needs: No Transportation Needs  . Lack of Transportation (Medical): No  . Lack of Transportation (Non-Medical): No  Physical Activity: Not on file  Stress: Not on file  Social Connections: Not on file  Intimate Partner Violence: Not on file    Physical Exam      Future Appointments  Date Time Provider [place]  09/21/2020  8:35 AM [facility] DEVICE REMOTES [facility]  10/02/2020  8:30 AM [facility] PA/NP [facility] None  10/14/2020 10:40 AM  Vevelyn Francois, NP [facility] None  11/12/2020 11:20 AM Larey Dresser, MD [facility] None  12/21/2020  8:35 AM [facility] DEVICE REMOTES [facility]  03/22/2021  8:35 AM [facility] DEVICE REMOTES [facility]  06/21/2021  8:35 AM [facility] DEVICE REMOTES [facility]  09/20/2021  8:35 AM [facility] DEVICE REMOTES [facility]  12/20/2021  8:35 AM [facility] DEVICE REMOTES [facility]    BP (!) 158/92   Pulse 76   Resp 18   Wt 231 lb (104.8 kg)   SpO2 98%   BMI 33.15 kg/m   Weight yesterday-? Last visit weight-?  Pt reports feeling ok, he is just tired as always. Still have not heard about his CPAP. [facility] in [facility] about the [facility] referral and I called them and left a VM but they have not called me back. He had cardiomems placed last week, tested COVID+ but  no symptoms, still none. He has not been using the cardiomems yet, she said since he has been home he has been going through a lot of stress, but I encouraged him to review the instructions and use it daily.  Pt denies dizziness. No c/p.  I have been trying to come out all week to get those med changes done for him but he has been tied up running errands.  meds verified and 2 wks of pill boxes were filled.   -refills for next week-- Vascepa, atorvastatin, isosorbide, meclizine  [name], [place] [facility]  09/16/20

## 2020-09-21 ENCOUNTER — Ambulatory Visit (INDEPENDENT_AMBULATORY_CARE_PROVIDER_SITE_OTHER): Payer: Medicare Other

## 2020-09-21 ENCOUNTER — Telehealth (HOSPITAL_COMMUNITY): Payer: Self-pay

## 2020-09-21 DIAGNOSIS — I255 Ischemic cardiomyopathy: Secondary | ICD-10-CM | POA: Diagnosis not present

## 2020-09-21 NOTE — Telephone Encounter (Signed)
I heard back from Troy Rush, CMA regarding his CPAP. She left a message for him to contact [facility] regarding his equipment, to contact them on 6/20-the lady there is out of town until then. I sent him the phone number again, will remind him to call next week if he hadnt already.   [name], EMT-Paramedic 09/21/20

## 2020-09-22 LAB — CUP PACEART REMOTE DEVICE CHECK
Battery Remaining Longevity: 150 mo
Battery Remaining Percentage: 100 %
Brady Statistic RV Percent Paced: 0 %
Date Time Interrogation Session: 20220613004200
HighPow Impedance: 61 Ohm
Implantable Lead Implant Date: 20180706
Implantable Lead Location: 753860
Implantable Lead Model: 293
Implantable Lead Serial Number: 432676
Implantable Pulse Generator Implant Date: 20180706
Lead Channel Impedance Value: 417 Ohm
Lead Channel Setting Pacing Amplitude: 2.5 V
Lead Channel Setting Pacing Pulse Width: 0.4 ms
Lead Channel Setting Sensing Sensitivity: 0.5 mV
Pulse Gen Serial Number: 236570

## 2020-09-23 ENCOUNTER — Encounter (HOSPITAL_COMMUNITY): Payer: Self-pay | Admitting: Adult Health

## 2020-09-23 NOTE — Progress Notes (Signed)
Team Based Care Meeting  HF MD- NA  HF NP - [place] NP-C   [place]   Medications concerns? Taking meds out of pill box for now but eventually will need bubble pack.   Transportation issues ? Uses [facility]  Education needs? Yes needs reinforcement on [employer].   SDOH - Deaf . Lives alone. Able to get groceries. Works in Estate manager/land agent at [employer].   Continue HF Paramedicine.   Rajean Desantiago Np_C  4:06 PM

## 2020-09-24 ENCOUNTER — Telehealth (HOSPITAL_COMMUNITY): Payer: Self-pay

## 2020-09-24 NOTE — Telephone Encounter (Signed)
Contacted pharmacy to refill-  Vascepa, atorvastatin, isosorbide, and meclizine   [name], EMT-Paramedic  09/24/20

## 2020-09-28 ENCOUNTER — Telehealth (HOSPITAL_COMMUNITY): Payer: Self-pay

## 2020-09-28 NOTE — Telephone Encounter (Signed)
I received a text on Saturday from patient that said "I try to kill my self" I only work mon-thur 8-6 and not allowed to take phone home so I just seen this message this morning.  There were no ER visits noted this past week.  I texted him back and asked for more details and I tried calling using the TTY but no answer.  He finally did text me back stating that he was in the bed, I replied that he needs to call me ASAP to discuss this suicide attempt. No reply yet, will add another note when he does.   [name], EMT-Paramedic  09/28/20

## 2020-09-28 NOTE — Telephone Encounter (Signed)
Pt did call me back on the TTY line so I was able to talk with him .  He said there are so many things going on and he got overwhelmed and is now severely depressed. Not feeling well. He has n/v.   He said that is owes SSDI for overpayment for last 5 years and now owes them money???  His rent is up to date for this month but next month is in question.  He has a shut off notice for his electric bill, work is slow due to it being in the summer and no school in, he works for [employer].  -he had to quickly get off phone due to need to vomit.  He has not SI right now. He just felt really depressed.  I urged him to go to [facility] to seek further care for the n/v and also for his depression.  He is going to send me pic of his utility bill and I will see what resources we have to assist with that.  He said he would call back, by 430pm he has not so I messaged him to call me back again but have not heard back from that either.   [name], EMT-Paramedic 09/28/20

## 2020-09-29 ENCOUNTER — Emergency Department (HOSPITAL_COMMUNITY)
Admission: EM | Admit: 2020-09-29 | Discharge: 2020-09-29 | Disposition: A | Discharge status: Home / Self Care | Payer: Medicare Other | Source: Home / Self Care | Attending: Emergency Medicine | Admitting: Emergency Medicine

## 2020-09-29 ENCOUNTER — Other Ambulatory Visit (HOSPITAL_COMMUNITY): Payer: Self-pay

## 2020-09-29 ENCOUNTER — Emergency Department (HOSPITAL_COMMUNITY): Payer: Medicare Other

## 2020-09-29 DIAGNOSIS — F32A Depression, unspecified: Secondary | ICD-10-CM | POA: Diagnosis not present

## 2020-09-29 DIAGNOSIS — N184 Chronic kidney disease, stage 4 (severe): Secondary | ICD-10-CM | POA: Insufficient documentation

## 2020-09-29 DIAGNOSIS — R079 Chest pain, unspecified: Secondary | ICD-10-CM | POA: Diagnosis not present

## 2020-09-29 DIAGNOSIS — U071 COVID-19: Secondary | ICD-10-CM | POA: Insufficient documentation

## 2020-09-29 DIAGNOSIS — I5022 Chronic systolic (congestive) heart failure: Secondary | ICD-10-CM | POA: Diagnosis not present

## 2020-09-29 DIAGNOSIS — E1122 Type 2 diabetes mellitus with diabetic chronic kidney disease: Secondary | ICD-10-CM | POA: Insufficient documentation

## 2020-09-29 DIAGNOSIS — I6522 Occlusion and stenosis of left carotid artery: Secondary | ICD-10-CM | POA: Diagnosis not present

## 2020-09-29 DIAGNOSIS — F329 Major depressive disorder, single episode, unspecified: Secondary | ICD-10-CM | POA: Insufficient documentation

## 2020-09-29 DIAGNOSIS — I130 Hypertensive heart and chronic kidney disease with heart failure and stage 1 through stage 4 chronic kidney disease, or unspecified chronic kidney disease: Secondary | ICD-10-CM | POA: Insufficient documentation

## 2020-09-29 DIAGNOSIS — I428 Other cardiomyopathies: Secondary | ICD-10-CM | POA: Diagnosis not present

## 2020-09-29 DIAGNOSIS — R45851 Suicidal ideations: Secondary | ICD-10-CM | POA: Diagnosis not present

## 2020-09-29 DIAGNOSIS — I5042 Chronic combined systolic (congestive) and diastolic (congestive) heart failure: Secondary | ICD-10-CM | POA: Insufficient documentation

## 2020-09-29 DIAGNOSIS — E1165 Type 2 diabetes mellitus with hyperglycemia: Secondary | ICD-10-CM | POA: Diagnosis not present

## 2020-09-29 DIAGNOSIS — R1114 Bilious vomiting: Secondary | ICD-10-CM

## 2020-09-29 DIAGNOSIS — Z7901 Long term (current) use of anticoagulants: Secondary | ICD-10-CM | POA: Insufficient documentation

## 2020-09-29 DIAGNOSIS — I639 Cerebral infarction, unspecified: Secondary | ICD-10-CM | POA: Diagnosis not present

## 2020-09-29 DIAGNOSIS — Z794 Long term (current) use of insulin: Secondary | ICD-10-CM | POA: Insufficient documentation

## 2020-09-29 DIAGNOSIS — Z79899 Other long term (current) drug therapy: Secondary | ICD-10-CM | POA: Insufficient documentation

## 2020-09-29 DIAGNOSIS — R1115 Cyclical vomiting syndrome unrelated to migraine: Secondary | ICD-10-CM | POA: Diagnosis not present

## 2020-09-29 DIAGNOSIS — I6381 Other cerebral infarction due to occlusion or stenosis of small artery: Secondary | ICD-10-CM | POA: Diagnosis not present

## 2020-09-29 DIAGNOSIS — R0602 Shortness of breath: Secondary | ICD-10-CM | POA: Diagnosis not present

## 2020-09-29 DIAGNOSIS — E871 Hypo-osmolality and hyponatremia: Secondary | ICD-10-CM | POA: Diagnosis not present

## 2020-09-29 DIAGNOSIS — R200 Anesthesia of skin: Secondary | ICD-10-CM | POA: Diagnosis not present

## 2020-09-29 DIAGNOSIS — R0789 Other chest pain: Secondary | ICD-10-CM | POA: Diagnosis not present

## 2020-09-29 DIAGNOSIS — I10 Essential (primary) hypertension: Secondary | ICD-10-CM | POA: Diagnosis not present

## 2020-09-29 DIAGNOSIS — R110 Nausea: Secondary | ICD-10-CM | POA: Diagnosis not present

## 2020-09-29 DIAGNOSIS — Z8616 Personal history of COVID-19: Secondary | ICD-10-CM | POA: Diagnosis not present

## 2020-09-29 LAB — BASIC METABOLIC PANEL
Anion gap: 13 (ref 5–15)
BUN: 31 mg/dL — ABNORMAL HIGH (ref 6–20)
CO2: 27 mmol/L (ref 22–32)
Calcium: 9.3 mg/dL (ref 8.9–10.3)
Chloride: 92 mmol/L — ABNORMAL LOW (ref 98–111)
Creatinine, Ser: 2.08 mg/dL — ABNORMAL HIGH (ref 0.61–1.24)
GFR, Estimated: 38 mL/min — ABNORMAL LOW (ref >60)
Glucose, Bld: 328 mg/dL — ABNORMAL HIGH (ref 70–99)
Potassium: 5.4 mmol/L — ABNORMAL HIGH (ref 3.5–5.1)
Sodium: 132 mmol/L — ABNORMAL LOW (ref 135–145)

## 2020-09-29 LAB — BRAIN NATRIURETIC PEPTIDE: B Natriuretic Peptide: 335 pg/mL — ABNORMAL HIGH (ref 0.0–100.0)

## 2020-09-29 MED ORDER — FAMOTIDINE 20 MG PO TABS: 20.0000 mg | ORAL_TABLET | Freq: Two times a day (BID) | ORAL | 0 refills | Status: DC

## 2020-09-29 MED ORDER — SUCRALFATE 1 G PO TABS: 1.0000 g | ORAL_TABLET | Freq: Three times a day (TID) | ORAL | 0 refills | Status: DC

## 2020-09-29 MED ORDER — SERTRALINE HCL 50 MG PO TABS: 50.0000 mg | ORAL_TABLET | Freq: Every day | ORAL | 0 refills | Status: DC

## 2020-09-29 MED ORDER — ONDANSETRON 4 MG PO TBDP: 4.0000 mg | ORAL_TABLET | Freq: Three times a day (TID) | ORAL | 0 refills | Status: DC | PRN

## 2020-09-29 MED ORDER — SERTRALINE HCL 50 MG PO TABS
25.0000 mg | ORAL_TABLET | Freq: Once | ORAL | Status: AC
  Administered 2020-09-29: 25 mg via ORAL
  Filled 2020-09-29: qty 1

## 2020-09-29 NOTE — Discharge Instructions (Addendum)
As discussed, your evaluation today has been largely reassuring.  But, it is important that you monitor your condition carefully, and do not hesitate to return to the ED if you develop new, or concerning changes in your condition.  Otherwise, please follow-up with your physician for appropriate ongoing care.  You are starting a new medication for depression.  It is very important that you discuss this with your physician.

## 2020-09-29 NOTE — ED Provider Notes (Signed)
[facility] DEPT Provider Note   CSN: [id number] Arrival date & time: 09/29/20  1047     History Chief Complaint  Patient presents with   Suicidal   Abdominal Pain   Hematemesis    Troy Rush is a 50 y.o. male.  HPI Patient presents with concern of recent episode of vomiting, with ongoing sore throat, but no chest pain, no abdominal pain, no headache.  Patient also had an episode of suicidal ideation yesterday.  Patient has multiple medical issues including CHF, depression, notes ongoing life stressors.  States that over the past couple days he has had some suicidal ideation, but none currently, nor does he have homicidal ideation or any thoughts of hurting himself. However, with ongoing stress he has felt this way recently.  With this consideration as well as his ongoing nausea and vomiting he spoke with his nurse today and was referred here for evaluation. He notes that prior to this he was doing generally well. History is obtained by the patient with assistance of a sign language interpreter.     Past Medical History:  Diagnosis Date   AICD (automatic cardioverter/defibrillator) present 10/14/2016   Bilateral deafness    Bronchitis    CHF (congestive heart failure) ([place])    Depression    Diabetes mellitus without complication ([place])    DVT, lower extremity ([place]) 07/12/2019   Dyslipidemia    Headache 08/2019   Hypertension    ICD (implantable cardioverter-defibrillator) in place    Insomnia 08/2019   NICM (nonischemic cardiomyopathy) ([place]) 09/15/2016   Pancreatitis    Sleep apnea    Vitamin D deficiency 08/2019    Patient Active Problem List   Diagnosis Date Noted   CKD (chronic kidney disease) stage 3, GFR 30-59 ml/min ([place]) 07/08/2020   Acute CHF (congestive heart failure) ([place]) 07/07/2020   CKD (chronic kidney disease), stage IV ([place]) 07/07/2020   ICD (implantable cardioverter-defibrillator) in place 05/14/2020   Pulmonary embolism  ([place]) 02/05/2020   Acute kidney injury superimposed on CKD ([place])    Chronic combined systolic and diastolic heart failure (H[facility])    Atrial fibrillation with RVR ([place]) 02/01/2020   NSVT (nonsustained ventricular tachycardia) ([place]) 02/01/2020   Acute GI bleeding 02/01/2020   DKA (diabetic ketoacidosis) ([place]) 02/01/2020   Bilateral deafness 08/15/2019   Type 2 diabetes mellitus with complication, with long-term current use of insulin ([place]) 08/15/2019   Hemoglobin A1C between 7% and 9% indicating borderline diabetic control 08/15/2019   Insomnia 08/15/2019   Abdominal pain 11/14/2018   Ischemic cardiomyopathy 10/14/2016   NICM (nonischemic cardiomyopathy) ([place]) [id number]   Chronic systolic CHF (congestive heart failure) ([place]) 05/30/2016   HTN (hypertension) 05/30/2016   Deaf 05/30/2016   Hyperkalemia 05/30/2016   Diabetes ([place]) 03/21/2016   Major depressive disorder, recurrent episode ([place]) 03/21/2016   Bronchitis 01/21/2015   Dyslipidemia 01/21/2015   Vitamin D deficiency 12/19/2011   Personal history of other infectious and parasitic diseases 10/25/2011   Anxiety disorder 05/12/2011   Hyperlipidemia 05/12/2011   Hypertriglyceridemia 05/12/2011   Obstructive sleep apnea 05/12/2011    Past Surgical History:  Procedure Laterality Date   BACK SURGERY     CARDIAC CATHETERIZATION     ESOPHAGOGASTRODUODENOSCOPY (EGD) WITH PROPOFOL N/A 02/02/2020   Procedure: ESOPHAGOGASTRODUODENOSCOPY (EGD) WITH PROPOFOL;  Surgeon: Arta Silence, MD;  Location: [facility];  Service: Endoscopy;  Laterality: N/A;   ICD IMPLANT  10/14/2016   ICD IMPLANT N/A 10/14/2016   Procedure: ICD Implant;  Surgeon: Caryl Comes,  Revonda Standard, MD;  Location: [facility];  Service: Cardiovascular;  Laterality: N/A;   PRESSURE SENSOR/CARDIOMEMS N/A 09/10/2020   Procedure: PRESSURE SENSOR/CARDIOMEMS;  Surgeon: Larey Dresser, MD;  Location: [facility] CV LAB;  Service: Cardiovascular;  Laterality: N/A;   RIGHT HEART CATH  N/A 05/21/2020   Procedure: RIGHT HEART CATH;  Surgeon: Larey Dresser, MD;  Location: [facility] CV LAB;  Service: Cardiovascular;  Laterality: N/A;   WRIST SURGERY         Family History  Problem Relation Age of Onset   Hypertension Mother    Healthy Father    Stroke Father     Social History   Tobacco Use   Smoking status: Never   Smokeless tobacco: Never  Vaping Use   Vaping Use: Never used  Substance Use Topics   Alcohol use: Yes    Alcohol/week: 1.0 - 2.0 standard drink    Types: 1 - 2 Glasses of wine per week    Comment: rare   Drug use: No    Home Medications Prior to Admission medications   Medication Sig Start Date End Date Taking? Authorizing Provider  amiodarone (PACERONE) 200 MG tablet Take 1 tablet (200 mg total) by mouth 2 (two) times daily. 09/11/20   Clegg, Amy D, NP  atorvastatin (LIPITOR) 80 MG tablet Take 1 tablet (80 mg total) by mouth at bedtime. 03/18/20   Larey Dresser, MD  carvedilol (COREG) 12.5 MG tablet Take 1 tablet (12.5 mg total) by mouth 2 (two) times daily. 09/11/20 09/11/21  Darrick Grinder D, NP  dapagliflozin propanediol (FARXIGA) 10 MG TABS tablet Take 1 tablet (10 mg total) by mouth daily before breakfast. 04/16/20   [place], Maricela Bo, FNP  dicyclomine (BENTYL) 20 MG tablet Take 1 tablet (20 mg total) by mouth 2 (two) times daily. Patient not taking: Reported on 09/16/2020 06/23/20   Marcello Fennel, PA-C  ELIQUIS 5 MG TABS tablet TAKE 1 TABLET(5 MG) BY MOUTH TWICE DAILY 09/09/20   Larey Dresser, MD  fenofibrate 54 MG tablet TAKE 1 TABLET (54 MG TOTAL) BY MOUTH DAILY. Patient taking differently: Take 54 mg by mouth in the morning. 07/06/20 10/04/20  Larey Dresser, MD  furosemide (LASIX) 20 MG tablet Take 2 tablets (40 mg total) by mouth daily. 09/11/20 10/11/20  Clegg, Amy D, NP  hydrALAZINE (APRESOLINE) 100 MG tablet Take 0.5 tablets (50 mg total) by mouth 3 (three) times daily. 09/11/20   Clegg, Amy D, NP  insulin glargine (LANTUS) 100 UNIT/ML  injection Inject 0.24 mLs (24 Units total) into the skin daily. Patient taking differently: Inject 24 Units into the skin in the morning. 02/04/20   Raiford Noble Latif, DO  isosorbide mononitrate (IMDUR) 60 MG 24 hr tablet Take 1 tablet (60 mg total) by mouth daily. Patient taking differently: Take 60 mg by mouth in the morning. 04/09/20   Larey Dresser, MD  Lancets MISC 1 each by Does not apply route 3 (three) times daily as needed. 03/11/20   [name], FNP  latanoprost (XALATAN) 0.005 % ophthalmic solution Place 1 drop into both eyes at bedtime. 01/20/20   [provider]  meclizine (ANTIVERT) 25 MG tablet Take 1 tablet (25 mg total) by mouth 3 (three) times daily as needed for dizziness. Patient taking differently: Take 25 mg by mouth 3 (three) times daily. 07/14/20   [name], FNP  pantoprazole (PROTONIX) 40 MG tablet Take 1 tablet (40 mg total) by  mouth 2 (two) times daily. 03/25/20   [name], FNP  VASCEPA 1 g capsule TAKE TWO CAPSULES BY MOUTH TWICE A DAY Patient taking differently: Take 2 g by mouth in the morning and at bedtime. 07/06/20   Larey Dresser, MD    Allergies    Lisinopril  Review of Systems   Review of Systems  Constitutional:        Per HPI, otherwise negative  HENT:         Per HPI, otherwise negative  Respiratory:         Per HPI, otherwise negative  Cardiovascular:        Per HPI, otherwise negative  Gastrointestinal:  Positive for vomiting.  Endocrine:       Negative aside from HPI  Genitourinary:        Neg aside from HPI   Musculoskeletal:        Per HPI, otherwise negative  Skin: Negative.   Neurological:  Negative for syncope.       Deaf   Physical Exam Updated Vital Signs BP (!) 139/102 (BP Location: Right Arm)   Pulse 94   Temp 98.9 F (37.2 C) (Oral)   Resp 20   Ht '5\' 10"'$  (1.778 m)   Wt 105.2 kg   SpO2 100%   BMI 33.29 kg/m   Physical Exam Vitals and nursing note reviewed.  Constitutional:       General: He is not in acute distress.    Appearance: He is well-developed. He is not ill-appearing, toxic-appearing or diaphoretic.  HENT:     Head: Normocephalic and atraumatic.  Eyes:     Conjunctiva/sclera: Conjunctivae normal.  Cardiovascular:     Rate and Rhythm: Normal rate and regular rhythm.  Pulmonary:     Effort: Pulmonary effort is normal. No respiratory distress.     Breath sounds: No stridor.  Abdominal:     General: There is no distension.     Tenderness: There is no abdominal tenderness.  Skin:    General: Skin is warm and dry.  Neurological:     Mental Status: He is alert and oriented to person, place, and time.  Psychiatric:        Mood and Affect: Affect normal.        Thought Content: Thought content does not include homicidal or suicidal ideation. Thought content does not include homicidal or suicidal plan.    ED Results / Procedures / Treatments   Labs (all labs ordered are listed, but only abnormal results are displayed) Labs Reviewed  BASIC METABOLIC PANEL - Abnormal; Notable for the following components:      Result Value   Sodium 132 (*)    Potassium 5.4 (*)    Chloride 92 (*)    Glucose, Bld 328 (*)    BUN 31 (*)    Creatinine, Ser 2.08 (*)    GFR, Estimated 38 (*)    All other components within normal limits  BRAIN NATRIURETIC PEPTIDE - Abnormal; Notable for the following components:   B Natriuretic Peptide 335.0 (*)    All other components within normal limits    EKG EKG Interpretation  Date/Time:  Tuesday September 29 2020 12:16:21 EDT Ventricular Rate:  83 PR Interval:  192 QRS Duration: 86 QT Interval:  428 QTC Calculation: 502 R Axis:   28 Text Interpretation: Normal sinus rhythm Possible Left atrial enlargement Nonspecific T wave abnormality Prolonged QT Abnormal ECG Confirmed by Carmin Muskrat [id number]) on 09/29/2020 12:32:50  PM  Radiology DG Chest 2 View  Result Date: 09/29/2020 CLINICAL DATA:  Shortness of breath. EXAM: CHEST - 2  VIEW COMPARISON:  09/11/2020 FINDINGS: The right ventricular pacer wire is stable. The CardioMEMS pulmonary artery pressure device is again noted. The heart is borderline enlarged but unchanged. The mediastinal and hilar contours are within normal limits and unchanged. No acute pulmonary findings. No infiltrates or effusions. No pneumothorax. The bony thorax is intact. IMPRESSION: No acute cardiopulmonary findings. Electronically Signed   By: Marijo Sanes M.D.   On: 09/29/2020 11:56    Procedures Procedures   Medications Ordered in ED Medications - No data to display  ED Course  I have reviewed the triage vital signs and the nursing notes.  Pertinent labs & imaging results that were available during my care of the patient were reviewed by me and considered in my medical decision making (see chart for details).  Patient notes that he was COVID-positive 2 weeks ago, this is likely contributing to his episode of nausea, vomiting.  Here he remains normotensive, no hypoxia, no tachycardia.  Chart review notable for discharge summary from 2 weeks ago following admission to her cardiology colleagues. Patient at that point had an episode of A. fib, RVR.  No evidence for that currently.  Patient also acknowledged to EMT earlier in the day that he had not been taking his heart failure medication regularly, possibly secondary to depression, COVID.  MDM Rules/Calculators/A&P Patient with multiple medical issues including CHF, implanted cardiac defibrillator presents largely with concern of depression, suicidal intent over the past few days, but with additional thought to recent episode of nausea, vomiting. Patient is awake, alert, sitting upright, in no distress, on his telephone for large parts of his ED monitoring course.  His labs here are notable for improved renal function, does have mild hyperkalemia, but no EKG evidence for substantial effects.  BMP similar to multiple prior studies. X-ray  reassuring. Patient had no ongoing vomiting here, noted that he improved substantially since yesterday, is comfortable with following up with outpatient care team.  He did request initiation of antidepressants, and given his history of previously using these medications, this is a reasonable accommodation.   Final Clinical Impression(s) / ED Diagnoses Final diagnoses:  COVID  Bilious vomiting with nausea  Depression, unspecified depression type     Carmin Muskrat, MD 09/29/20 1539

## 2020-09-29 NOTE — Progress Notes (Signed)
I reached out to f/u with pt regarding his mental health, he does report the vomiting has stopped but the suicidal thoughts have came back off and on with the depression. So I advised him I would have to send him out to hosp. I arrived along with EMS unit and his b/p was 166/118, he has not taken 2 wks of his meds b/c of his depression.  With that b/p he is unable to go to [facility] so it was decided he go to [facility] EMS txp him to hosp. He did not have a plan to harm himself. But obligation to get him seen is still there.   Will f/u once he is d/c.   [name], EMT-Paramedic 09/29/20

## 2020-09-29 NOTE — ED Triage Notes (Signed)
Per EMS- Patient reports that he has been depressed x 2 weeks and is feeling suicidal with no plan. Patient is deaf. Patient c/o abdominal pain and vomiting blood. Patient is currently taking blood thinners. 12 lead-NSR. Patient has a history of CHF.

## 2020-09-30 ENCOUNTER — Other Ambulatory Visit: Payer: Self-pay

## 2020-09-30 ENCOUNTER — Inpatient Hospital Stay (HOSPITAL_COMMUNITY)
Admission: EM | Admit: 2020-09-30 | Discharge: 2020-10-03 | DRG: 065 | Disposition: A | Discharge status: Rehabilitation Facility | Payer: Medicare Other | Attending: Internal Medicine | Admitting: Internal Medicine

## 2020-09-30 ENCOUNTER — Encounter (HOSPITAL_COMMUNITY): Payer: Self-pay | Admitting: Emergency Medicine

## 2020-09-30 ENCOUNTER — Inpatient Hospital Stay (HOSPITAL_COMMUNITY): Payer: Medicare Other

## 2020-09-30 ENCOUNTER — Emergency Department (HOSPITAL_COMMUNITY): Payer: Medicare Other

## 2020-09-30 DIAGNOSIS — E669 Obesity, unspecified: Secondary | ICD-10-CM | POA: Diagnosis present

## 2020-09-30 DIAGNOSIS — Z8249 Family history of ischemic heart disease and other diseases of the circulatory system: Secondary | ICD-10-CM | POA: Diagnosis not present

## 2020-09-30 DIAGNOSIS — E871 Hypo-osmolality and hyponatremia: Secondary | ICD-10-CM | POA: Diagnosis present

## 2020-09-30 DIAGNOSIS — K5900 Constipation, unspecified: Secondary | ICD-10-CM | POA: Diagnosis present

## 2020-09-30 DIAGNOSIS — E785 Hyperlipidemia, unspecified: Secondary | ICD-10-CM | POA: Diagnosis present

## 2020-09-30 DIAGNOSIS — I428 Other cardiomyopathies: Secondary | ICD-10-CM | POA: Diagnosis present

## 2020-09-30 DIAGNOSIS — D631 Anemia in chronic kidney disease: Secondary | ICD-10-CM | POA: Diagnosis present

## 2020-09-30 DIAGNOSIS — F32A Depression, unspecified: Secondary | ICD-10-CM | POA: Diagnosis present

## 2020-09-30 DIAGNOSIS — Z8616 Personal history of COVID-19: Secondary | ICD-10-CM

## 2020-09-30 DIAGNOSIS — N184 Chronic kidney disease, stage 4 (severe): Secondary | ICD-10-CM | POA: Diagnosis present

## 2020-09-30 DIAGNOSIS — Z794 Long term (current) use of insulin: Secondary | ICD-10-CM

## 2020-09-30 DIAGNOSIS — G8191 Hemiplegia, unspecified affecting right dominant side: Secondary | ICD-10-CM | POA: Diagnosis present

## 2020-09-30 DIAGNOSIS — Z86711 Personal history of pulmonary embolism: Secondary | ICD-10-CM | POA: Diagnosis not present

## 2020-09-30 DIAGNOSIS — Z6832 Body mass index (BMI) 32.0-32.9, adult: Secondary | ICD-10-CM | POA: Diagnosis not present

## 2020-09-30 DIAGNOSIS — E1140 Type 2 diabetes mellitus with diabetic neuropathy, unspecified: Secondary | ICD-10-CM | POA: Diagnosis not present

## 2020-09-30 DIAGNOSIS — R531 Weakness: Secondary | ICD-10-CM | POA: Diagnosis not present

## 2020-09-30 DIAGNOSIS — Z86718 Personal history of other venous thrombosis and embolism: Secondary | ICD-10-CM | POA: Diagnosis not present

## 2020-09-30 DIAGNOSIS — Z95810 Presence of automatic (implantable) cardiac defibrillator: Secondary | ICD-10-CM

## 2020-09-30 DIAGNOSIS — E119 Type 2 diabetes mellitus without complications: Secondary | ICD-10-CM

## 2020-09-30 DIAGNOSIS — Z6833 Body mass index (BMI) 33.0-33.9, adult: Secondary | ICD-10-CM | POA: Diagnosis not present

## 2020-09-30 DIAGNOSIS — I6389 Other cerebral infarction: Secondary | ICD-10-CM

## 2020-09-30 DIAGNOSIS — I6330 Cerebral infarction due to thrombosis of unspecified cerebral artery: Secondary | ICD-10-CM | POA: Diagnosis not present

## 2020-09-30 DIAGNOSIS — F339 Major depressive disorder, recurrent, unspecified: Secondary | ICD-10-CM | POA: Diagnosis present

## 2020-09-30 DIAGNOSIS — E118 Type 2 diabetes mellitus with unspecified complications: Secondary | ICD-10-CM

## 2020-09-30 DIAGNOSIS — Z8679 Personal history of other diseases of the circulatory system: Secondary | ICD-10-CM

## 2020-09-30 DIAGNOSIS — G4733 Obstructive sleep apnea (adult) (pediatric): Secondary | ICD-10-CM | POA: Diagnosis present

## 2020-09-30 DIAGNOSIS — I4891 Unspecified atrial fibrillation: Secondary | ICD-10-CM | POA: Diagnosis present

## 2020-09-30 DIAGNOSIS — Z7984 Long term (current) use of oral hypoglycemic drugs: Secondary | ICD-10-CM | POA: Diagnosis not present

## 2020-09-30 DIAGNOSIS — I5022 Chronic systolic (congestive) heart failure: Secondary | ICD-10-CM | POA: Diagnosis not present

## 2020-09-30 DIAGNOSIS — R42 Dizziness and giddiness: Secondary | ICD-10-CM | POA: Diagnosis not present

## 2020-09-30 DIAGNOSIS — Z823 Family history of stroke: Secondary | ICD-10-CM

## 2020-09-30 DIAGNOSIS — K219 Gastro-esophageal reflux disease without esophagitis: Secondary | ICD-10-CM | POA: Diagnosis present

## 2020-09-30 DIAGNOSIS — Z79899 Other long term (current) drug therapy: Secondary | ICD-10-CM | POA: Diagnosis not present

## 2020-09-30 DIAGNOSIS — E1165 Type 2 diabetes mellitus with hyperglycemia: Secondary | ICD-10-CM | POA: Diagnosis present

## 2020-09-30 DIAGNOSIS — R29703 NIHSS score 3: Secondary | ICD-10-CM | POA: Diagnosis present

## 2020-09-30 DIAGNOSIS — I482 Chronic atrial fibrillation, unspecified: Secondary | ICD-10-CM

## 2020-09-30 DIAGNOSIS — R29810 Facial weakness: Secondary | ICD-10-CM | POA: Diagnosis not present

## 2020-09-30 DIAGNOSIS — I6381 Other cerebral infarction due to occlusion or stenosis of small artery: Secondary | ICD-10-CM | POA: Diagnosis not present

## 2020-09-30 DIAGNOSIS — R29818 Other symptoms and signs involving the nervous system: Secondary | ICD-10-CM | POA: Diagnosis not present

## 2020-09-30 DIAGNOSIS — R45851 Suicidal ideations: Secondary | ICD-10-CM | POA: Diagnosis present

## 2020-09-30 DIAGNOSIS — I130 Hypertensive heart and chronic kidney disease with heart failure and stage 1 through stage 4 chronic kidney disease, or unspecified chronic kidney disease: Secondary | ICD-10-CM | POA: Diagnosis present

## 2020-09-30 DIAGNOSIS — E1122 Type 2 diabetes mellitus with diabetic chronic kidney disease: Secondary | ICD-10-CM | POA: Diagnosis not present

## 2020-09-30 DIAGNOSIS — Z7901 Long term (current) use of anticoagulants: Secondary | ICD-10-CM | POA: Diagnosis not present

## 2020-09-30 DIAGNOSIS — E781 Pure hyperglyceridemia: Secondary | ICD-10-CM

## 2020-09-30 DIAGNOSIS — R200 Anesthesia of skin: Secondary | ICD-10-CM | POA: Diagnosis not present

## 2020-09-30 DIAGNOSIS — I6522 Occlusion and stenosis of left carotid artery: Secondary | ICD-10-CM | POA: Diagnosis not present

## 2020-09-30 DIAGNOSIS — U071 COVID-19: Secondary | ICD-10-CM | POA: Diagnosis not present

## 2020-09-30 DIAGNOSIS — I959 Hypotension, unspecified: Secondary | ICD-10-CM | POA: Diagnosis not present

## 2020-09-30 DIAGNOSIS — H913 Deaf nonspeaking, not elsewhere classified: Secondary | ICD-10-CM | POA: Diagnosis present

## 2020-09-30 DIAGNOSIS — F330 Major depressive disorder, recurrent, mild: Secondary | ICD-10-CM | POA: Diagnosis not present

## 2020-09-30 DIAGNOSIS — I10 Essential (primary) hypertension: Secondary | ICD-10-CM | POA: Diagnosis present

## 2020-09-30 DIAGNOSIS — I69398 Other sequelae of cerebral infarction: Secondary | ICD-10-CM | POA: Diagnosis not present

## 2020-09-30 DIAGNOSIS — I639 Cerebral infarction, unspecified: Secondary | ICD-10-CM | POA: Diagnosis not present

## 2020-09-30 DIAGNOSIS — R2689 Other abnormalities of gait and mobility: Secondary | ICD-10-CM | POA: Diagnosis present

## 2020-09-30 DIAGNOSIS — H9193 Unspecified hearing loss, bilateral: Secondary | ICD-10-CM | POA: Diagnosis present

## 2020-09-30 DIAGNOSIS — I2510 Atherosclerotic heart disease of native coronary artery without angina pectoris: Secondary | ICD-10-CM | POA: Diagnosis present

## 2020-09-30 LAB — I-STAT CHEM 8, ED
BUN: 31 mg/dL — ABNORMAL HIGH (ref 6–20)
Calcium, Ion: 1.12 mmol/L — ABNORMAL LOW (ref 1.15–1.40)
Chloride: 94 mmol/L — ABNORMAL LOW (ref 98–111)
Creatinine, Ser: 2.6 mg/dL — ABNORMAL HIGH (ref 0.61–1.24)
Glucose, Bld: 445 mg/dL — ABNORMAL HIGH (ref 70–99)
HCT: 36 % — ABNORMAL LOW (ref 39.0–52.0)
Hemoglobin: 12.2 g/dL — ABNORMAL LOW (ref 13.0–17.0)
Potassium: 4 mmol/L (ref 3.5–5.1)
Sodium: 130 mmol/L — ABNORMAL LOW (ref 135–145)
TCO2: 25 mmol/L (ref 22–32)

## 2020-09-30 LAB — DIFFERENTIAL
Abs Immature Granulocytes: 0.02 10*3/uL (ref 0.00–0.07)
Basophils Absolute: 0 10*3/uL (ref 0.0–0.1)
Basophils Relative: 1 %
Eosinophils Absolute: 0.1 10*3/uL (ref 0.0–0.5)
Eosinophils Relative: 2 %
Immature Granulocytes: 0 %
Lymphocytes Relative: 13 %
Lymphs Abs: 0.7 10*3/uL (ref 0.7–4.0)
Monocytes Absolute: 0.5 10*3/uL (ref 0.1–1.0)
Monocytes Relative: 9 %
Neutro Abs: 4.1 10*3/uL (ref 1.7–7.7)
Neutrophils Relative %: 75 %

## 2020-09-30 LAB — COMPREHENSIVE METABOLIC PANEL
ALT: 42 U/L (ref 0–44)
AST: 44 U/L — ABNORMAL HIGH (ref 15–41)
Albumin: 3.2 g/dL — ABNORMAL LOW (ref 3.5–5.0)
Alkaline Phosphatase: 48 U/L (ref 38–126)
Anion gap: 13 (ref 5–15)
BUN: 32 mg/dL — ABNORMAL HIGH (ref 6–20)
CO2: 23 mmol/L (ref 22–32)
Calcium: 9.1 mg/dL (ref 8.9–10.3)
Chloride: 93 mmol/L — ABNORMAL LOW (ref 98–111)
Creatinine, Ser: 2.59 mg/dL — ABNORMAL HIGH (ref 0.61–1.24)
GFR, Estimated: 29 mL/min — ABNORMAL LOW (ref >60)
Glucose, Bld: 434 mg/dL — ABNORMAL HIGH (ref 70–99)
Potassium: 4 mmol/L (ref 3.5–5.1)
Sodium: 129 mmol/L — ABNORMAL LOW (ref 135–145)
Total Bilirubin: 1.4 mg/dL — ABNORMAL HIGH (ref 0.3–1.2)
Total Protein: 6.7 g/dL (ref 6.5–8.1)

## 2020-09-30 LAB — IRON AND TIBC
Iron: 24 ug/dL — ABNORMAL LOW (ref 45–182)
Saturation Ratios: 9 % — ABNORMAL LOW (ref 17.9–39.5)
TIBC: 276 ug/dL (ref 250–450)
UIBC: 252 ug/dL

## 2020-09-30 LAB — CBC
HCT: 35.5 % — ABNORMAL LOW (ref 39.0–52.0)
Hemoglobin: 11.1 g/dL — ABNORMAL LOW (ref 13.0–17.0)
MCH: 28.9 pg (ref 26.0–34.0)
MCHC: 31.3 g/dL (ref 30.0–36.0)
MCV: 92.4 fL (ref 80.0–100.0)
Platelets: 131 10*3/uL — ABNORMAL LOW (ref 150–400)
RBC: 3.84 MIL/uL — ABNORMAL LOW (ref 4.22–5.81)
RDW: 13.4 % (ref 11.5–15.5)
WBC: 5.4 10*3/uL (ref 4.0–10.5)
nRBC: 0 % (ref 0.0–0.2)

## 2020-09-30 LAB — ECHOCARDIOGRAM LIMITED
Height: 70 in
Weight: 3710.78 oz

## 2020-09-30 LAB — PROTIME-INR
INR: 1 (ref 0.8–1.2)
Prothrombin Time: 13.6 seconds (ref 11.4–15.2)

## 2020-09-30 LAB — HEMOGLOBIN A1C
Hgb A1c MFr Bld: 9.7 % — ABNORMAL HIGH (ref 4.8–5.6)
Mean Plasma Glucose: 231.69 mg/dL

## 2020-09-30 LAB — RESP PANEL BY RT-PCR (FLU A&B, COVID) ARPGX2
Influenza A by PCR: NEGATIVE
Influenza B by PCR: NEGATIVE
SARS Coronavirus 2 by RT PCR: POSITIVE — AB

## 2020-09-30 LAB — APTT: aPTT: 26 seconds (ref 24–36)

## 2020-09-30 LAB — CBG MONITORING, ED: Glucose-Capillary: 334 mg/dL — ABNORMAL HIGH (ref 70–99)

## 2020-09-30 MED ORDER — ACETAMINOPHEN 160 MG/5ML PO SOLN: 650.0000 mg | ORAL | Status: DC | PRN

## 2020-09-30 MED ORDER — FENOFIBRATE 54 MG PO TABS
54.0000 mg | ORAL_TABLET | Freq: Every morning | ORAL | Status: DC
  Administered 2020-10-01 – 2020-10-03 (×3): 54 mg via ORAL
  Filled 2020-09-30 (×3): qty 1

## 2020-09-30 MED ORDER — HYDRALAZINE HCL 50 MG PO TABS
50.0000 mg | ORAL_TABLET | Freq: Three times a day (TID) | ORAL | Status: DC
  Administered 2020-10-01: 50 mg via ORAL
  Filled 2020-09-30: qty 1

## 2020-09-30 MED ORDER — ACETAMINOPHEN 650 MG RE SUPP
650.0000 mg | RECTAL | Status: DC | PRN
Start: 2020-09-30 — End: 2020-10-03

## 2020-09-30 MED ORDER — STROKE: EARLY STAGES OF RECOVERY BOOK
Freq: Once | Status: AC
  Filled 2020-09-30: qty 1

## 2020-09-30 MED ORDER — ATORVASTATIN CALCIUM 80 MG PO TABS
80.0000 mg | ORAL_TABLET | Freq: Every day | ORAL | Status: DC
  Administered 2020-09-30 – 2020-10-02 (×3): 80 mg via ORAL
  Filled 2020-09-30 (×3): qty 1

## 2020-09-30 MED ORDER — AMIODARONE HCL 200 MG PO TABS
200.0000 mg | ORAL_TABLET | Freq: Two times a day (BID) | ORAL | Status: DC
  Administered 2020-10-01 – 2020-10-03 (×5): 200 mg via ORAL
  Filled 2020-09-30 (×5): qty 1

## 2020-09-30 MED ORDER — CARVEDILOL 12.5 MG PO TABS
12.5000 mg | ORAL_TABLET | Freq: Two times a day (BID) | ORAL | Status: DC
  Administered 2020-09-30 – 2020-10-03 (×6): 12.5 mg via ORAL
  Filled 2020-09-30 (×6): qty 1

## 2020-09-30 MED ORDER — ICOSAPENT ETHYL 1 G PO CAPS
2.0000 g | ORAL_CAPSULE | Freq: Two times a day (BID) | ORAL | Status: DC
  Administered 2020-10-01 – 2020-10-03 (×5): 2 g via ORAL
  Filled 2020-09-30 (×6): qty 2

## 2020-09-30 MED ORDER — IOHEXOL 350 MG/ML SOLN
100.0000 mL | Freq: Once | INTRAVENOUS | Status: AC | PRN
  Administered 2020-09-30: 100 mL via INTRAVENOUS

## 2020-09-30 MED ORDER — ACETAMINOPHEN 325 MG PO TABS
650.0000 mg | ORAL_TABLET | ORAL | Status: DC | PRN
  Administered 2020-09-30: 650 mg via ORAL
  Filled 2020-09-30: qty 2

## 2020-09-30 MED ORDER — FAMOTIDINE 20 MG PO TABS
20.0000 mg | ORAL_TABLET | Freq: Two times a day (BID) | ORAL | Status: DC
  Administered 2020-09-30 – 2020-10-03 (×6): 20 mg via ORAL
  Filled 2020-09-30 (×6): qty 1

## 2020-09-30 MED ORDER — INSULIN GLARGINE 100 UNIT/ML ~~LOC~~ SOLN
24.0000 [IU] | Freq: Every morning | SUBCUTANEOUS | Status: DC
  Administered 2020-10-01 – 2020-10-03 (×3): 24 [IU] via SUBCUTANEOUS
  Filled 2020-09-30 (×5): qty 0.24

## 2020-09-30 MED ORDER — SENNOSIDES-DOCUSATE SODIUM 8.6-50 MG PO TABS: 1.0000 | ORAL_TABLET | Freq: Every evening | ORAL | Status: DC | PRN

## 2020-09-30 MED ORDER — SODIUM CHLORIDE 0.9% FLUSH: 3.0000 mL | Freq: Once | INTRAVENOUS | Status: DC

## 2020-09-30 MED ORDER — PANTOPRAZOLE SODIUM 40 MG PO TBEC
40.0000 mg | DELAYED_RELEASE_TABLET | Freq: Two times a day (BID) | ORAL | Status: DC
  Administered 2020-09-30 – 2020-10-03 (×6): 40 mg via ORAL
  Filled 2020-09-30 (×6): qty 1

## 2020-09-30 MED ORDER — APIXABAN 5 MG PO TABS
5.0000 mg | ORAL_TABLET | Freq: Two times a day (BID) | ORAL | Status: DC
  Administered 2020-09-30 – 2020-10-03 (×6): 5 mg via ORAL
  Filled 2020-09-30 (×6): qty 1

## 2020-09-30 MED ORDER — INSULIN ASPART 100 UNIT/ML IJ SOLN
0.0000 [IU] | Freq: Three times a day (TID) | INTRAMUSCULAR | Status: DC
  Administered 2020-09-30: 7 [IU] via SUBCUTANEOUS
  Administered 2020-10-01: 3 [IU] via SUBCUTANEOUS
  Administered 2020-10-01: 9 [IU] via SUBCUTANEOUS
  Administered 2020-10-02: 2 [IU] via SUBCUTANEOUS
  Administered 2020-10-02: 7 [IU] via SUBCUTANEOUS
  Administered 2020-10-02: 3 [IU] via SUBCUTANEOUS
  Administered 2020-10-03: 2 [IU] via SUBCUTANEOUS
  Administered 2020-10-03: 3 [IU] via SUBCUTANEOUS

## 2020-09-30 MED ORDER — SERTRALINE HCL 50 MG PO TABS
50.0000 mg | ORAL_TABLET | Freq: Every day | ORAL | Status: DC
  Administered 2020-10-01 – 2020-10-03 (×3): 50 mg via ORAL
  Filled 2020-09-30 (×3): qty 1

## 2020-09-30 MED ORDER — MECLIZINE HCL 12.5 MG PO TABS: 25.0000 mg | ORAL_TABLET | Freq: Three times a day (TID) | ORAL | Status: DC | PRN

## 2020-09-30 MED ORDER — DICYCLOMINE HCL 20 MG PO TABS
20.0000 mg | ORAL_TABLET | ORAL | Status: DC | PRN
  Administered 2020-10-01: 20 mg via ORAL
  Filled 2020-09-30 (×2): qty 1

## 2020-09-30 MED ORDER — FUROSEMIDE 40 MG PO TABS
40.0000 mg | ORAL_TABLET | Freq: Every day | ORAL | Status: DC
  Administered 2020-10-01: 40 mg via ORAL
  Filled 2020-09-30: qty 1

## 2020-09-30 MED ORDER — LATANOPROST 0.005 % OP SOLN
1.0000 [drp] | Freq: Every day | OPHTHALMIC | Status: DC
  Administered 2020-10-01 – 2020-10-02 (×2): 1 [drp] via OPHTHALMIC
  Filled 2020-09-30: qty 2.5

## 2020-09-30 MED ORDER — ISOSORBIDE MONONITRATE ER 60 MG PO TB24
60.0000 mg | ORAL_TABLET | Freq: Every morning | ORAL | Status: DC
  Administered 2020-10-01: 60 mg via ORAL
  Filled 2020-09-30: qty 1

## 2020-09-30 NOTE — ED Notes (Signed)
Report attempted on 3W, pt no approved yet to the unit per CN

## 2020-09-30 NOTE — ED Triage Notes (Signed)
Pt arrive by EMS from home as a code stroke for c/o numbness, weakness and dizziness that started today at 10:45 am. Pt on CT already when assigned to me and Rapid respond nurse with pt. CBG 433 pta, initial BP 90/60, 150 NS given to the pt pta bp up 112/58, HR 80.

## 2020-09-30 NOTE — ED Notes (Signed)
Sign language interpreter used for assessment by this RN, ED provider and neurologist staff. Interpreter service [place]. [phone].

## 2020-09-30 NOTE — Consult Note (Signed)
Neurology Consultation  Reason for Consult: Right weakness and sensory deficits Referring Physician: Dr. Tomi Bamberger  CC: Right weakness and sensory deficits  History is obtained from: Patient via [place] video interpreter, EMS  HPI: Troy Rush is a 50 y.o. deaf male with a medical history significant for congestive heart failure, DVT, pulmonary embolism on Eliquis, chronic kidney disease stave IV, type 2 diabetes mellitus with hyperglycemia, hypertension, hyperlipidemia, nonischemic cardiomyopathy with ICD in place, obstructive sleep apnea, atrial fibrillation with rapid ventricular response, depression, COVID + on 09/11/2020 and was seen and discharged from [facility] yesterday for vomiting, sore throat, and recent suicidal ideation. He presented to [facility] ED 09/30/2020 via EMS for evaluation of right-sided sensory deficits and right-sided weakness. He states that he woke up feeling well and in his normal state of health when he had a sudden onset of dizziness and right-sided deficits at 10:45 this morning causing him to have to crawl along the floor to get his phone and contact EMS. He states that he feels "heavy" with decreased sensation to the right face, arm, and leg and states that he felt that his right leg was weak and could not support him.   LKW: 10:45 tpa given?: no, patient on full AC- Eliquis.  IR Thrombectomy? No, presentation is not consistent with LVO Modified Rankin Scale: 0-Completely asymptomatic and back to baseline post- stroke  ROS: A complete ROS was performed and is negative except as noted in the HPI.   Past Medical History:  Diagnosis Date   AICD (automatic cardioverter/defibrillator) present 10/14/2016   Bilateral deafness    Bronchitis    CHF (congestive heart failure) ([place])    Depression    Diabetes mellitus without complication ([place])    DVT, lower extremity ([place]) 07/12/2019   Dyslipidemia    Headache 08/2019   Hypertension    ICD (implantable  cardioverter-defibrillator) in place    Insomnia 08/2019   NICM (nonischemic cardiomyopathy) ([place]) 09/15/2016   Pancreatitis    Sleep apnea    Vitamin D deficiency 08/2019   Past Surgical History:  Procedure Laterality Date   BACK SURGERY     CARDIAC CATHETERIZATION     ESOPHAGOGASTRODUODENOSCOPY (EGD) WITH PROPOFOL N/A 02/02/2020   Procedure: ESOPHAGOGASTRODUODENOSCOPY (EGD) WITH PROPOFOL;  Surgeon: Arta Silence, MD;  Location: [facility];  Service: Endoscopy;  Laterality: N/A;   ICD IMPLANT  10/14/2016   ICD IMPLANT N/A 10/14/2016   Procedure: ICD Implant;  Surgeon: Deboraha Sprang, MD;  Location: [place] [facility];  Service: Cardiovascular;  Laterality: N/A;   PRESSURE SENSOR/CARDIOMEMS N/A 09/10/2020   Procedure: PRESSURE SENSOR/CARDIOMEMS;  Surgeon: Larey Dresser, MD;  Location: [place] [facility];  Service: Cardiovascular;  Laterality: N/A;   RIGHT HEART CATH N/A 05/21/2020   Procedure: RIGHT HEART CATH;  Surgeon: Larey Dresser, MD;  Location: [place] [facility];  Service: Cardiovascular;  Laterality: N/A;   WRIST SURGERY     Family History  Problem Relation Age of Onset   Hypertension Mother    Healthy Father    Stroke Father    Social History:   reports that he has never smoked. He has never used smokeless tobacco. He reports current alcohol use of about 1.0 - 2.0 standard drink of alcohol per week. He reports that he does not use drugs.  Medications  Current Facility-Administered Medications:    sodium chloride flush (NS) 0.9 % injection 3 mL, 3 mL, Intravenous, Once, Dorie Rank, MD  Current Outpatient Medications:  amiodarone (PACERONE) 200 MG tablet, Take 1 tablet (200 mg total) by mouth 2 (two) times daily., Disp: 60 tablet, Rfl: 6   atorvastatin (LIPITOR) 80 MG tablet, Take 1 tablet (80 mg total) by mouth at bedtime., Disp: 30 tablet, Rfl: 11   carvedilol (COREG) 12.5 MG tablet, Take 1 tablet (12.5 mg total) by mouth 2 (two) times daily., Disp: 60 tablet,  Rfl: 11   dapagliflozin propanediol (FARXIGA) 10 MG TABS tablet, Take 1 tablet (10 mg total) by mouth daily before breakfast., Disp: 30 tablet, Rfl: 11   dicyclomine (BENTYL) 20 MG tablet, Take 1 tablet (20 mg total) by mouth 2 (two) times daily. (Patient not taking: Reported on 09/16/2020), Disp: 20 tablet, Rfl: 0   ELIQUIS 5 MG TABS tablet, TAKE 1 TABLET(5 MG) BY MOUTH TWICE DAILY, Disp: 60 tablet, Rfl: 11   famotidine (PEPCID) 20 MG tablet, Take 1 tablet (20 mg total) by mouth 2 (two) times daily., Disp: 30 tablet, Rfl: 0   fenofibrate 54 MG tablet, TAKE 1 TABLET (54 MG TOTAL) BY MOUTH DAILY. (Patient taking differently: Take 54 mg by mouth in the morning.), Disp: 90 tablet, Rfl: 0   furosemide (LASIX) 20 MG tablet, Take 2 tablets (40 mg total) by mouth daily., Disp: 60 tablet, Rfl: 0   hydrALAZINE (APRESOLINE) 100 MG tablet, Take 0.5 tablets (50 mg total) by mouth 3 (three) times daily., Disp: 90 tablet, Rfl: 3   insulin glargine (LANTUS) 100 UNIT/ML injection, Inject 0.24 mLs (24 Units total) into the skin daily. (Patient taking differently: Inject 24 Units into the skin in the morning.), Disp: 10 mL, Rfl: 11   isosorbide mononitrate (IMDUR) 60 MG 24 hr tablet, Take 1 tablet (60 mg total) by mouth daily. (Patient taking differently: Take 60 mg by mouth in the morning.), Disp: 30 tablet, Rfl: 11   Lancets MISC, 1 each by Does not apply route 3 (three) times daily as needed., Disp: 300 each, Rfl: 3   latanoprost (XALATAN) 0.005 % ophthalmic solution, Place 1 drop into both eyes at bedtime., Disp: , Rfl:    meclizine (ANTIVERT) 25 MG tablet, Take 1 tablet (25 mg total) by mouth 3 (three) times daily as needed for dizziness. (Patient taking differently: Take 25 mg by mouth 3 (three) times daily.), Disp: 90 tablet, Rfl: 3   ondansetron (ZOFRAN ODT) 4 MG disintegrating tablet, Take 1 tablet (4 mg total) by mouth every 8 (eight) hours as needed for nausea or vomiting., Disp: 20 tablet, Rfl: 0    pantoprazole (PROTONIX) 40 MG tablet, Take 1 tablet (40 mg total) by mouth 2 (two) times daily., Disp: 180 tablet, Rfl: 3   sertraline (ZOLOFT) 50 MG tablet, Take 1 tablet (50 mg total) by mouth daily., Disp: 30 tablet, Rfl: 0   sucralfate (CARAFATE) 1 g tablet, Take 1 tablet (1 g total) by mouth 4 (four) times daily -  with meals and at bedtime., Disp: 21 tablet, Rfl: 0   VASCEPA 1 g capsule, TAKE TWO CAPSULES BY MOUTH TWICE A DAY (Patient taking differently: Take 2 g by mouth in the morning and at bedtime.), Disp: 120 capsule, Rfl: 5  Exam: Current vital signs: BP (!) 142/74 (BP Location: Right Arm)   Pulse 86   Temp 98.4 F (36.9 C) (Oral)   Resp 17   Ht '5\' 10"'$  (1.778 m)   Wt 105.2 kg   SpO2 100%   BMI 33.28 kg/m  Vital signs in last 24 hours: Temp:  [98.4 F (36.9 C)]  98.4 F (36.9 C) (06/22 1313) Pulse Rate:  [86-97] 86 (06/22 1313) Resp:  [16-20] 17 (06/22 1313) BP: (139-184)/(74-121) 142/74 (06/22 1313) SpO2:  [100 %] 100 % (06/22 1313) Weight:  [105.2 kg] 105.2 kg (06/22 1301)  GENERAL: Awake, alert, sitting up in EMS stretcher, in no acute distress Psych: Calm and cooperative with examination Head: Normocephalic and atraumatic EENT: Normal conjunctivae, no OP obstruction, dry mucous membranes LUNGS: Normal respiratory effort. Non-labored breathing CV: Regular rate on tele ABDOMEN: Soft, non-tender, non-distended Ext: warm, well perfused, no obvious deformity  NEURO:  Mental Status: Awake, alert, and oriented x 3.  Patient is deaf at baseline and uses sign language to communicate. Able to sign fluently with ASL interpreter. He is able to provide a clear and coherent history of present illness.  Cranial Nerves:  II: PERRL 3 mm/brisk. Visual fields full.  III, IV, VI: EOMI without ptosis V: Sensation to light touch is intact to bilateral face with decreased sensation felt on the right face.  VII: Face is symmetric resting and smiling. VIII: Patient is deaf  bilaterally at baseline IX, X: Patient does not phonate  XI: Normal sternocleidomastoid and trapezius muscle strength XII: Tongue protrudes midline without fasciculations.   Motor: Right upper extremity 4-/5 biceps and triceps, 4/5 grip strength with minimal vertical drift on assessment. Left upper extremity with 5/5 strength throughout and without vertical drift on assessment. Left lower extremity 5/5 strength without vertical drift. Right lower extremity has 4/5 strength and is able to overcome gravity but there is drift on assessment.  Tone is normal. Bulk is normal.  Sensation: Decreased sensation to light touch on right upper and lower extremities compared to the left.  Coordination: No ataxic movements noted DTRs: 2+ and symmetric biceps and brachioradialis.  Gait: deferred  NIHSS: 1a Level of Conscious.: 0 1b LOC Questions: 0 1c LOC Commands: 0 2 Best Gaze: 0 3 Visual: 0 4 Facial Palsy: 0 5a Motor Arm - left: 1 5b Motor Arm - Right: 0 6a Motor Leg - Left: 1 6b Motor Leg - Right: 0 7 Limb Ataxia: 0 8 Sensory: 1 9 Best Language: 0 10 Dysarthria: N/A due to deaf at baseline- communicates via ASL 11 Extinct. and Inatten.: 0 TOTAL: 3  Labs I have reviewed labs in epic and the results pertinent to this consultation are: CBC    Component Value Date/Time   WBC 5.4 09/30/2020 1237   RBC 3.84 (L) 09/30/2020 1237   HGB 11.1 (L) 09/30/2020 1237   HGB 12.7 (L) 07/19/2018 1100   HCT 35.5 (L) 09/30/2020 1237   HCT 39.3 07/19/2018 1100   PLT 131 (L) 09/30/2020 1237   PLT 246 07/19/2018 1100   MCV 92.4 09/30/2020 1237   MCV 90 07/19/2018 1100   MCH 28.9 09/30/2020 1237   MCHC 31.3 09/30/2020 1237   RDW 13.4 09/30/2020 1237   RDW 12.1 07/19/2018 1100   LYMPHSABS 0.7 09/30/2020 1237   LYMPHSABS 1.4 07/19/2018 1100   MONOABS 0.5 09/30/2020 1237   EOSABS 0.1 09/30/2020 1237   EOSABS 0.0 07/19/2018 1100   BASOSABS 0.0 09/30/2020 1237   BASOSABS 0.0 07/19/2018 1100   CMP      Component Value Date/Time   NA 129 (L) 09/30/2020 1237   NA 137 03/17/2020 1135   K 4.0 09/30/2020 1237   CL 93 (L) 09/30/2020 1237   CO2 23 09/30/2020 1237   GLUCOSE 434 (H) 09/30/2020 1237   BUN 32 (H) 09/30/2020 1237   BUN  37 (H) 03/17/2020 1135   CREATININE 2.59 (H) 09/30/2020 1237   CREATININE 0.99 12/13/2016 1202   CALCIUM 9.1 09/30/2020 1237   PROT 6.7 09/30/2020 1237   PROT 7.3 07/19/2018 1100   ALBUMIN 3.2 (L) 09/30/2020 1237   ALBUMIN 3.9 (L) 07/19/2018 1100   AST 44 (H) 09/30/2020 1237   ALT 42 09/30/2020 1237   ALKPHOS 48 09/30/2020 1237   BILITOT 1.4 (H) 09/30/2020 1237   BILITOT 1.8 (H) 07/19/2018 1100   GFRNONAA 29 (L) 09/30/2020 1237   GFRNONAA >89 06/20/2016 1057   GFRAA 38 (L) 03/17/2020 1135   GFRAA >89 06/20/2016 1057    Lipid Panel     Component Value Date/Time   CHOL 158 04/30/2020 1437   CHOL 480 (H) 12/09/2019 0830   TRIG 220 (H) 04/30/2020 1437   HDL 38 (L) 04/30/2020 1437   HDL 32 (L) 12/09/2019 0830   CHOLHDL 4.2 04/30/2020 1437   VLDL 44 (H) 04/30/2020 1437   LDLCALC 76 04/30/2020 1437   LDLCALC Comment (A) 12/09/2019 0830   LDLDIRECT 111.1 (H) 09/06/2019 1243   Lab Results  Component Value Date   HGBA1C 10.3 (H) 09/10/2020   Imaging I have reviewed the images obtained:  CT-scan of the brain, CT angio head and neck 09/30/2020: There is no acute intracranial hemorrhage or evidence of acute infarction. ASPECT score is 10. No large vessel occlusion. Moderate narrowing of the distal left cavernous and paraclinoid ICA presumably secondary to circumferential noncalcified plaque.  MRI examination of the brain pending  Assessment: 50 y.o. male with PMHx of HTN, HLD, DM2, OSA, DVT/PE, AF with RVR, CKD IV, CHF, depression, and nonischemic cardiomyopathy with ICD in place who presented as a Code Stroke for evaluation of R weakness and decreased sensation of the R hemibody.  - Stroke risk factors include HTN, HLD, CKD IV, DM2, remote PE and DVT,  and AF with RVR as noted from ER visit to [facility] yesterday 09/29/2020. - CT head without intracranial abnormality or LVO and with an ASPECT of 10.  - Examination reveals patient is oriented with persistent right upper and lower extremity weakness and right face, arm, and leg numbness.  - Presentation most consistent with acute ischemic stroke. MRI brain pending for further evaluation. Patient not a tPA candidate due to full anticoagulation on Eliquis. IR not activated for thrombectomy due to no LVO suspected from patient presentation or identified on CT angiography. Will complete stroke work up.  - Unable to obtain MRI due to ICD. Will repeat CTH at 24 hours.  Recommendations: - Admit for further stroke work up - HgbA1c, fasting lipid panel - Unable to obtain MRI due to ICD, will repeat CTH at 24 hours - Frequent neuro checks - Echocardiogram - Prophylactic therapy: Continue home AC - Permissive hypertension for 24 hours; treat systolic blood pressure > 220 - Risk factor modification - Telemetry monitoring - PT consult, OT consult, Speech consult - Management of hyperglycemia per EDP / primary team   Anibal Henderson, AGAC-NP [facility] Pager: [phone]  NEUROHOSPITALIST ADDENDUM Performed a face to face diagnostic evaluation.   I have reviewed the contents of history and physical exam as documented by PA/ARNP/Resident and agree with above documentation.  I have discussed and formulated the above plan as documented. Edits to the note have been made as needed.  Impression/Key exam findings/Plan: 22mwith several poorly controlled stroke risk factors on Eliquis who presents with acute R sided weakness and numbness. CT Head negative, CTA  with no LVO. Likely a lacunar stroke. Unable to get MRI 2/2 ICD incompatibility.   Recommend stroke workup as above and better control of Diabetes, HTN, CHF, HLD. Continue Eliquis. No antiplatelet.  Donnetta Simpers, MD [facility] [phone]   If 7pm to 7am, please call on call as listed on AMION.

## 2020-09-30 NOTE — ED Notes (Signed)
Report attempted 

## 2020-09-30 NOTE — ED Notes (Signed)
Report attempted multiple times to different units and CN continues to said pt is no assigned to any nurse.

## 2020-09-30 NOTE — ED Notes (Signed)
Pt arrive to ED and take to CT with RR nurse prior to this RN assuming care of it. Unable to complete initial stroke assessment and documentation. Pt arrive to his room and sign language interpreter used to communicate with the pt. Pt unable to complete language assessment.

## 2020-09-30 NOTE — ED Provider Notes (Signed)
[facility] EMERGENCY DEPARTMENT Provider Note   CSN: [id number] Arrival date & time: 09/30/20  1227     History Chief Complaint  Patient presents with   Code Stroke    Troy Rush is a 50 y.o. male.  HPI Patient is a 50 year old male with a history of hyperlipidemia, DVT, diabetes mellitus, CHF, deafness, atrial fibrillation, who presents to the emergency department via EMS as a code stroke.  Patient states around 10 AM he started experiencing an episode of lightheadedness with right-sided numbness/weakness.  Unsure if facial droop but told staff that the right side of his face "felt funny".  He states that when this occurred he had to lower himself to the floor and began crawling before calling EMS.  Code stroke was initiated in the field.  Neurology evaluated the patient and states that CT scan of the head is reassuring but recommends admission for stroke work-up.  Patient denies any chest pain or shortness of breath at this time.  He is anticoagulated on Eliquis and states that he was experiencing some nausea and vomiting the past few days and missed about 3 days of his Eliquis, which she restarted this morning.    Past Medical History:  Diagnosis Date   AICD (automatic cardioverter/defibrillator) present 10/14/2016   Bilateral deafness    Bronchitis    CHF (congestive heart failure) ([place])    Depression    Diabetes mellitus without complication ([place])    DVT, lower extremity ([place]) 07/12/2019   Dyslipidemia    Headache 08/2019   Hypertension    ICD (implantable cardioverter-defibrillator) in place    Insomnia 08/2019   NICM (nonischemic cardiomyopathy) ([place]) 09/15/2016   Pancreatitis    Sleep apnea    Vitamin D deficiency 08/2019    Patient Active Problem List   Diagnosis Date Noted   CKD (chronic kidney disease) stage 3, GFR 30-59 ml/min ([place]) 07/08/2020   Acute CHF (congestive heart failure) ([place]) 07/07/2020   CKD (chronic kidney disease), stage IV  ([place]) 07/07/2020   ICD (implantable cardioverter-defibrillator) in place 05/14/2020   Pulmonary embolism ([place]) 02/05/2020   Acute kidney injury superimposed on CKD ([place])    Chronic combined systolic and diastolic heart failure (HCC)    Atrial fibrillation with RVR ([place]) 02/01/2020   NSVT (nonsustained ventricular tachycardia) ([place]) 02/01/2020   Acute GI bleeding 02/01/2020   DKA (diabetic ketoacidosis) ([place]) 02/01/2020   Bilateral deafness 08/15/2019   Type 2 diabetes mellitus with complication, with long-term current use of insulin ([place]) 08/15/2019   Hemoglobin A1C between 7% and 9% indicating borderline diabetic control 08/15/2019   Insomnia 08/15/2019   Abdominal pain 11/14/2018   Ischemic cardiomyopathy 10/14/2016   NICM (nonischemic cardiomyopathy) ([place]) [id number]   Chronic systolic CHF (congestive heart failure) ([place]) 05/30/2016   HTN (hypertension) 05/30/2016   Deaf 05/30/2016   Hyperkalemia 05/30/2016   Diabetes ([place]) 03/21/2016   Major depressive disorder, recurrent episode ([place]) 03/21/2016   Bronchitis 01/21/2015   Dyslipidemia 01/21/2015   Vitamin D deficiency 12/19/2011   Personal history of other infectious and parasitic diseases 10/25/2011   Anxiety disorder 05/12/2011   Hyperlipidemia 05/12/2011   Hypertriglyceridemia 05/12/2011   Obstructive sleep apnea 05/12/2011    Past Surgical History:  Procedure Laterality Date   BACK SURGERY     CARDIAC CATHETERIZATION     ESOPHAGOGASTRODUODENOSCOPY (EGD) WITH PROPOFOL N/A 02/02/2020   Procedure: ESOPHAGOGASTRODUODENOSCOPY (EGD) WITH PROPOFOL;  Surgeon: Arta Silence, MD;  Location: [facility];  Service: Endoscopy;  Laterality: N/A;  ICD IMPLANT  10/14/2016   ICD IMPLANT N/A 10/14/2016   Procedure: ICD Implant;  Surgeon: Deboraha Sprang, MD;  Location: [place] [facility];  Service: Cardiovascular;  Laterality: N/A;   PRESSURE SENSOR/CARDIOMEMS N/A 09/10/2020   Procedure: PRESSURE SENSOR/CARDIOMEMS;  Surgeon: Larey Dresser, MD;  Location: [place] [facility];  Service: Cardiovascular;  Laterality: N/A;   RIGHT HEART CATH N/A 05/21/2020   Procedure: RIGHT HEART CATH;  Surgeon: Larey Dresser, MD;  Location: [place] [facility];  Service: Cardiovascular;  Laterality: N/A;   WRIST SURGERY         Family History  Problem Relation Age of Onset   Hypertension Mother    Healthy Father    Stroke Father     Social History   Tobacco Use   Smoking status: Never   Smokeless tobacco: Never  Vaping Use   Vaping Use: Never used  Substance Use Topics   Alcohol use: Yes    Alcohol/week: 1.0 - 2.0 standard drink    Types: 1 - 2 Glasses of wine per week    Comment: rare   Drug use: No    Home Medications Prior to Admission medications   Medication Sig Start Date End Date Taking? Authorizing Provider  amiodarone (PACERONE) 200 MG tablet Take 1 tablet (200 mg total) by mouth 2 (two) times daily. 09/11/20   Clegg, Amy D, NP  atorvastatin (LIPITOR) 80 MG tablet Take 1 tablet (80 mg total) by mouth at bedtime. 03/18/20   Larey Dresser, MD  carvedilol (COREG) 12.5 MG tablet Take 1 tablet (12.5 mg total) by mouth 2 (two) times daily. 09/11/20 09/11/21  Darrick Grinder D, NP  dapagliflozin propanediol (FARXIGA) 10 MG TABS tablet Take 1 tablet (10 mg total) by mouth daily before breakfast. 04/16/20   [place], Maricela Bo, FNP  dicyclomine (BENTYL) 20 MG tablet Take 1 tablet (20 mg total) by mouth 2 (two) times daily. Patient not taking: Reported on 09/16/2020 06/23/20   Marcello Fennel, PA-C  ELIQUIS 5 MG TABS tablet TAKE 1 TABLET(5 MG) BY MOUTH TWICE DAILY 09/09/20   Larey Dresser, MD  famotidine (PEPCID) 20 MG tablet Take 1 tablet (20 mg total) by mouth 2 (two) times daily. 09/29/20   Carmin Muskrat, MD  fenofibrate 54 MG tablet TAKE 1 TABLET (54 MG TOTAL) BY MOUTH DAILY. Patient taking differently: Take 54 mg by mouth in the morning. 07/06/20 10/04/20  Larey Dresser, MD  furosemide (LASIX) 20 MG tablet Take 2 tablets  (40 mg total) by mouth daily. 09/11/20 10/11/20  Clegg, Amy D, NP  hydrALAZINE (APRESOLINE) 100 MG tablet Take 0.5 tablets (50 mg total) by mouth 3 (three) times daily. 09/11/20   Clegg, Amy D, NP  insulin glargine (LANTUS) 100 UNIT/ML injection Inject 0.24 mLs (24 Units total) into the skin daily. Patient taking differently: Inject 24 Units into the skin in the morning. 02/04/20   Raiford Noble Latif, DO  isosorbide mononitrate (IMDUR) 60 MG 24 hr tablet Take 1 tablet (60 mg total) by mouth daily. Patient taking differently: Take 60 mg by mouth in the morning. 04/09/20   Larey Dresser, MD  Lancets MISC 1 each by Does not apply route 3 (three) times daily as needed. 03/11/20   [name], FNP  latanoprost (XALATAN) 0.005 % ophthalmic solution Place 1 drop into both eyes at bedtime. 01/20/20   [provider]  meclizine (ANTIVERT) 25 MG tablet Take 1 tablet (25 mg total)  by mouth 3 (three) times daily as needed for dizziness. Patient taking differently: Take 25 mg by mouth 3 (three) times daily. 07/14/20   [name], FNP  ondansetron (ZOFRAN ODT) 4 MG disintegrating tablet Take 1 tablet (4 mg total) by mouth every 8 (eight) hours as needed for nausea or vomiting. 09/29/20   Carmin Muskrat, MD  pantoprazole (PROTONIX) 40 MG tablet Take 1 tablet (40 mg total) by mouth 2 (two) times daily. 03/25/20   [name], FNP  sertraline (ZOLOFT) 50 MG tablet Take 1 tablet (50 mg total) by mouth daily. 09/29/20   Carmin Muskrat, MD  sucralfate (CARAFATE) 1 g tablet Take 1 tablet (1 g total) by mouth 4 (four) times daily -  with meals and at bedtime. 09/29/20   Carmin Muskrat, MD  VASCEPA 1 g capsule TAKE TWO CAPSULES BY MOUTH TWICE A DAY Patient taking differently: Take 2 g by mouth in the morning and at bedtime. 07/06/20   Larey Dresser, MD    Allergies    Lisinopril  Review of Systems   Review of Systems  All other systems reviewed and are negative. Ten systems reviewed and  are negative for acute change, except as noted in the HPI.   Physical Exam Updated Vital Signs BP (!) 142/74 (BP Location: Right Arm)   Pulse 86   Temp 98.4 F (36.9 C) (Oral)   Resp 17   Ht '5\' 10"'$  (1.778 m)   Wt 105.2 kg   SpO2 100%   BMI 33.28 kg/m   Physical Exam Vitals and nursing note reviewed.  Constitutional:      General: He is not in acute distress.    Appearance: Normal appearance. He is not ill-appearing, toxic-appearing or diaphoretic.  HENT:     Head: Normocephalic and atraumatic.     Right Ear: External ear normal.     Left Ear: External ear normal.     Nose: Nose normal.     Mouth/Throat:     Mouth: Mucous membranes are moist.     Pharynx: Oropharynx is clear. No oropharyngeal exudate or posterior oropharyngeal erythema.  Eyes:     General: No scleral icterus.       Right eye: No discharge.        Left eye: No discharge.     Extraocular Movements: Extraocular movements intact.     Conjunctiva/sclera: Conjunctivae normal.  Cardiovascular:     Rate and Rhythm: Normal rate and regular rhythm.     Pulses: Normal pulses.     Heart sounds: Normal heart sounds. No murmur heard.   No friction rub. No gallop.  Pulmonary:     Effort: Pulmonary effort is normal. No respiratory distress.     Breath sounds: Normal breath sounds. No stridor. No wheezing, rhonchi or rales.  Abdominal:     General: Abdomen is flat.     Palpations: Abdomen is soft.     Tenderness: There is no abdominal tenderness.  Musculoskeletal:        General: Normal range of motion.     Cervical back: Normal range of motion and neck supple. No tenderness.  Skin:    General: Skin is warm and dry.  Neurological:     General: No focal deficit present.     Mental Status: He is alert and oriented to person, place, and time.     Comments: Patient is oriented to person, place, and time. Patient phonates in clear, complete, and coherent sentences.  Mild arm  drift on the right.  Strength is 5/5 in the  left arm and leg and is 4/5 in the right arm and leg.  Subjective decrease in sensation in the right arm and leg.   Psychiatric:        Mood and Affect: Mood normal.        Behavior: Behavior normal.    ED Results / Procedures / Treatments   Labs (all labs ordered are listed, but only abnormal results are displayed) Labs Reviewed  CBC - Abnormal; Notable for the following components:      Result Value   RBC 3.84 (*)    Hemoglobin 11.1 (*)    HCT 35.5 (*)    Platelets 131 (*)    All other components within normal limits  COMPREHENSIVE METABOLIC PANEL - Abnormal; Notable for the following components:   Sodium 129 (*)    Chloride 93 (*)    Glucose, Bld 434 (*)    BUN 32 (*)    Creatinine, Ser 2.59 (*)    Albumin 3.2 (*)    AST 44 (*)    Total Bilirubin 1.4 (*)    GFR, Estimated 29 (*)    All other components within normal limits  I-STAT CHEM 8, ED - Abnormal; Notable for the following components:   Sodium 130 (*)    Chloride 94 (*)    BUN 31 (*)    Creatinine, Ser 2.60 (*)    Glucose, Bld 445 (*)    Calcium, Ion 1.12 (*)    Hemoglobin 12.2 (*)    HCT 36.0 (*)    All other components within normal limits  RESP PANEL BY RT-PCR (FLU A&B, COVID) ARPGX2  PROTIME-INR  APTT  DIFFERENTIAL  CBG MONITORING, ED   EKG EKG Interpretation  Date/Time:  Wednesday September 30 2020 13:10:22 EDT Ventricular Rate:  87 PR Interval:  195 QRS Duration: 91 QT Interval:  418 QTC Calculation: 503 R Axis:   18 Text Interpretation: Sinus rhythm Consider right atrial enlargement Abnormal T, consider ischemia, lateral leads , new since last tracing Prolonged QT interval Confirmed by Dorie Rank [phone]) on 09/30/2020 1:13:23 PM  Radiology DG Chest 2 View  Result Date: 09/29/2020 CLINICAL DATA:  Shortness of breath. EXAM: CHEST - 2 VIEW COMPARISON:  09/11/2020 FINDINGS: The right ventricular pacer wire is stable. The CardioMEMS pulmonary artery pressure device is again noted. The heart is  borderline enlarged but unchanged. The mediastinal and hilar contours are within normal limits and unchanged. No acute pulmonary findings. No infiltrates or effusions. No pneumothorax. The bony thorax is intact. IMPRESSION: No acute cardiopulmonary findings. Electronically Signed   By: Marijo Sanes M.D.   On: 09/29/2020 11:56   CT HEAD CODE STROKE WO CONTRAST  Result Date: 09/30/2020 CLINICAL DATA:  Code stroke EXAM: CT HEAD WITHOUT CONTRAST CT ANGIOGRAPHY OF THE HEAD AND NECK TECHNIQUE: Contiguous axial images were obtained from the base of the skull through the vertex without intravenous contrast. Multidetector CT imaging of the head and neck was performed using the standard protocol during bolus administration of intravenous contrast. Multiplanar CT image reconstructions and MIPs were obtained to evaluate the vascular anatomy. Carotid stenosis measurements (when applicable) are obtained utilizing NASCET criteria, using the distal internal carotid diameter as the denominator. CONTRAST:  138m OMNIPAQUE IOHEXOL 350 MG/ML SOLN COMPARISON:  05/05/2020 CT head FINDINGS: CT HEAD Brain: There is no acute intracranial hemorrhage, mass effect, or edema. Gray-white differentiation is preserved. There is no extra-axial fluid collection. Ventricles and  sulci are stable in size and configuration. Vascular: No hyperdense vessel. Skull: Calvarium is unremarkable. Sinuses/Orbits: No acute finding. Other: None. Review of the MIP images confirms the above findings ASPECTS [facility] Stroke Program Early CT Score) - Ganglionic level infarction (caudate, lentiform nuclei, internal capsule, insula, M1-M3 cortex): 7 - Supraganglionic infarction (M4-M6 cortex): 3 Total score (0-10 with 10 being normal): 10 CTA NECK Aortic arch: Great vessel origins are patent. Right carotid system: Patent. No stenosis or evidence of dissection. Left carotid system: Patent.  No stenosis or evidence of dissection. Vertebral arteries: Patent and nearly  codominant. No stenosis or evidence of dissection. Skeleton: No significant osseous abnormality. Other neck: Unremarkable. Upper chest: Included upper lobes are clear.  Left chest wall ICD. Review of the MIP images confirms the above findings CTA HEAD Anterior circulation: Intracranial internal carotid arteries are patent. There is moderate narrowing of the left distal cavernous and paraclinoid ICA presumably secondary to circumferential noncalcified plaque. Anterior cerebral arteries are patent. Anterior communicating artery is present. Middle cerebral arteries are patent. Posterior circulation: Intracranial vertebral arteries are patent. Basilar artery is patent. Major cerebellar artery origins are patent. Posterior cerebral arteries are patent. Venous sinuses: Patent as allowed by contrast bolus timing. Review of the MIP images confirms the above findings IMPRESSION: There is no acute intracranial hemorrhage or evidence of acute infarction. ASPECT score is 10. No large vessel occlusion. Moderate narrowing of the distal left cavernous and paraclinoid ICA presumably secondary to circumferential noncalcified plaque. Initial results were communicated to Dr. Lorrin Goodell at 12:58 pm on 09/30/2020 by text page via the [facility] messaging system. Electronically Signed   By: Macy Mis M.D.   On: 09/30/2020 13:16   CT ANGIO HEAD NECK W WO CM (CODE STROKE)  Result Date: 09/30/2020 CLINICAL DATA:  Code stroke EXAM: CT HEAD WITHOUT CONTRAST CT ANGIOGRAPHY OF THE HEAD AND NECK TECHNIQUE: Contiguous axial images were obtained from the base of the skull through the vertex without intravenous contrast. Multidetector CT imaging of the head and neck was performed using the standard protocol during bolus administration of intravenous contrast. Multiplanar CT image reconstructions and MIPs were obtained to evaluate the vascular anatomy. Carotid stenosis measurements (when applicable) are obtained utilizing NASCET criteria, using  the distal internal carotid diameter as the denominator. CONTRAST:  180m OMNIPAQUE IOHEXOL 350 MG/ML SOLN COMPARISON:  05/05/2020 CT head FINDINGS: CT HEAD Brain: There is no acute intracranial hemorrhage, mass effect, or edema. Gray-white differentiation is preserved. There is no extra-axial fluid collection. Ventricles and sulci are stable in size and configuration. Vascular: No hyperdense vessel. Skull: Calvarium is unremarkable. Sinuses/Orbits: No acute finding. Other: None. Review of the MIP images confirms the above findings [facility]S [facility] - Ganglionic level infarction (caudate, lentiform nuclei, internal capsule, insula, M1-M3 cortex): 7 - Supraganglionic infarction (M4-M6 cortex): 3 Total score (0-10 with 10 being normal): 10 CTA NECK Aortic arch: Great vessel origins are patent. Right carotid system: Patent. No stenosis or evidence of dissection. Left carotid system: Patent.  No stenosis or evidence of dissection. Vertebral arteries: Patent and nearly codominant. No stenosis or evidence of dissection. Skeleton: No significant osseous abnormality. Other neck: Unremarkable. Upper chest: Included upper lobes are clear.  Left chest wall ICD. Review of the MIP images confirms the above findings CTA HEAD Anterior circulation: Intracranial internal carotid arteries are patent. There is moderate narrowing of the left distal cavernous and paraclinoid ICA presumably secondary to circumferential noncalcified plaque. Anterior cerebral arteries are patent. Anterior  communicating artery is present. Middle cerebral arteries are patent. Posterior circulation: Intracranial vertebral arteries are patent. Basilar artery is patent. Major cerebellar artery origins are patent. Posterior cerebral arteries are patent. Venous sinuses: Patent as allowed by contrast bolus timing. Review of the MIP images confirms the above findings IMPRESSION: There is no acute intracranial hemorrhage or evidence of  acute infarction. ASPECT score is 10. No large vessel occlusion. Moderate narrowing of the distal left cavernous and paraclinoid ICA presumably secondary to circumferential noncalcified plaque. Initial results were communicated to Dr. Lorrin Goodell at 12:58 pm on 09/30/2020 by text page via the [facility] messaging system. Electronically Signed   By: Macy Mis M.D.   On: 09/30/2020 13:16    Procedures Procedures   Medications Ordered in ED Medications  sodium chloride flush (NS) 0.9 % injection 3 mL (3 mLs Intravenous Not Given 09/30/20 1310)  iohexol (OMNIPAQUE) 350 MG/ML injection 100 mL (100 mLs Intravenous Contrast Given 09/30/20 1300)   ED Course  I have reviewed the triage vital signs and the nursing notes.  Pertinent labs & imaging results that were available during my care of the patient were reviewed by me and considered in my medical decision making (see chart for details).    MDM Rules/Calculators/A&P                          Pt is a 50 y.o. male who presents to the ED via EMS as a code stroke.   Labs: CBC with a hemoglobin of 11.1, platelets of 131, hematocrit of 35.5. CMP with a sodium of 129, chloride of 93, glucose of 434, BUN of 32, creatinine 2.59, albumin of 3.2, AST of 44, total bilirubin of 1.4, GFR of 29. PT/INR within normal limits. APTT within normal limits. Respiratory panel is pending.  Imaging: CT scan of the head with findings showing no acute intracranial hemorrhage or evidence of acute infarction. ASPECT score is 10. No large vessel occlusion. Moderate narrowing of the distal left cavernous and paraclinoid ICA presumably secondary to circumferential noncalcified plaque. CTA of the head and neck showing no acute intracranial hemorrhage or evidence of acute infarction. ASPECT score is 10. No large vessel occlusion. Moderate narrowing of the distal left cavernous and paraclinoid ICA presumably secondary to circumferential noncalcified plaque.   I, Rayna Sexton,  PA-C, personally reviewed and evaluated these images and lab results as part of my medical decision-making.  Code stroke initiated in the field by EMS.  CT and CTA of the head and neck with findings as noted above.  Patient was evaluated by neurology at bedside.  They recommend admission for further stroke work-up at this time.  This was discussed with the patient and he is amenable.  Note: Portions of this report may have been transcribed using voice recognition software. Every effort was made to ensure accuracy; however, inadvertent computerized transcription errors may be present.   Final Clinical Impression(s) / ED Diagnoses Final diagnoses:  Weakness  Numbness   Rx / DC Orders ED Discharge Orders     None        Rayna Sexton, PA-C 09/30/20 1323    Dorie Rank, MD 10/01/20 [phone]

## 2020-09-30 NOTE — Code Documentation (Signed)
Arrived to scene at this time. Patient was back in ED 17 being evaluated by Neurology NP Lahoma Crocker. Patient was able to fully answer all questions and follow commands.   Story per NP, pt was seen at [facility] yesterday for N/V. Pt reports it subsiding and having no other issues since. He woke up this morning at his normal and at 1045 noticed a sudden onset of right sided weakness. He texted a friend and they were able to call EMS.  EMS activated a Code Stroke. NIHSS 3 - Right Arm and Leg Drift with sensory decreased. Pt is not a tPA candidate due to being on Eliquis with potential recent GI bleeding, as well.   Care Plan: q2 mNIHSS/VS, Admit with Stroke Work-UP.

## 2020-09-30 NOTE — Progress Notes (Signed)
Echocardiogram 2D Echocardiogram has been performed.  [name] Roald Lukacs 09/30/2020, 3:44 PM

## 2020-09-30 NOTE — H&P (Addendum)
History and Physical    Troy Rush [id number] DOB: 1970-09-25 DOA: 09/30/2020  PCP: Merryl Hacker, No Consultants:  cardiology: Dr. Aundra Dubin, Dr. Caryl Comes Patient coming from:  Home - lives alone. He is deaf and ASL video interpreter used for history.   Chief Complaint: right sided weakness and numbness.   HPI: Troy Rush is a 50 y.o. male with medical history significant of hyperlipidemia, systolic CHF, HTN, atrial fibrillation, nonischemic cardiomyopathy with ICD, DM type2, deafness, hx of DVT/PE on eliquis, depression, CKD stage 4, OSA who presented to ER with concerns for a stroke.   He states he woke up and felt normal, but around 10:00Am he had dizziness and felt numb on the right side of his body. He wasn't able to walk due to the dizziness. He felt like his right leg felt frozen and he couldn't use it. He called his nurse and told him to call 911. He called EMS and stroke code was initiated. He states the right side of his body still feels weak and numb, but it has improved some. He can moved his right hand, but it's still hard to use his hand. He denies any drooping face. Has some tingling in his lips and his right side of his face almost has some pain like sensation.  He denies any headache, vision changes. He is not dizzy, but had an episode of dizziness earlier today. No chest pain, shortness of breath, palpitations. He has missed 3 days of his medication due to vomiting. He started taking medication today. No hx of stroke in the past. He is right handed.  He denies any fever/chills, coughing, leg swelling.   Recently seen at [facility] for SI and covid + and was started on zoloft; however, he has not started this. He denies any SI/HI/AH/VH. Denies any cough, fever, sneezing. Tested positive for covid at beginning of June.   ED Course: code stroke initiated in field. Vitals in ER: bp: 142/74, HR: 86, afebrile, RR: 17 and oxygen: 100% on room air. CT head and CTA done with no acute findings.  Neurology evaluated and following ? Lacunar stroke. Asked to admit for stroke rule out and chronic medical management.    Review of Systems: As per HPI; otherwise review of systems reviewed and negative.   Ambulatory Status:  Ambulates without assistance   Past Medical History:  Diagnosis Date   AICD (automatic cardioverter/defibrillator) present 10/14/2016   Bilateral deafness    Bronchitis    CHF (congestive heart failure) ([place])    Depression    Diabetes mellitus without complication ([place])    DVT, lower extremity ([place]) 07/12/2019   Dyslipidemia    Headache 08/2019   Hypertension    ICD (implantable cardioverter-defibrillator) in place    Insomnia 08/2019   NICM (nonischemic cardiomyopathy) ([place]) 09/15/2016   Pancreatitis    Sleep apnea    Vitamin D deficiency 08/2019    Past Surgical History:  Procedure Laterality Date   BACK SURGERY     CARDIAC CATHETERIZATION     ESOPHAGOGASTRODUODENOSCOPY (EGD) WITH PROPOFOL N/A 02/02/2020   Procedure: ESOPHAGOGASTRODUODENOSCOPY (EGD) WITH PROPOFOL;  Surgeon: Arta Silence, MD;  Location: [facility];  Service: Endoscopy;  Laterality: N/A;   ICD IMPLANT  10/14/2016   ICD IMPLANT N/A 10/14/2016   Procedure: ICD Implant;  Surgeon: Deboraha Sprang, MD;  Location: [place] [facility];  Service: Cardiovascular;  Laterality: N/A;   PRESSURE SENSOR/CARDIOMEMS N/A 09/10/2020   Procedure: PRESSURE SENSOR/CARDIOMEMS;  Surgeon: Larey Dresser,  MD;  Location: [place] [facility];  Service: Cardiovascular;  Laterality: N/A;   RIGHT HEART CATH N/A 05/21/2020   Procedure: RIGHT HEART CATH;  Surgeon: Larey Dresser, MD;  Location: [place] [facility];  Service: Cardiovascular;  Laterality: N/A;   WRIST SURGERY      Social History   Socioeconomic History   Marital status: Legally Separated    Spouse name: Not on file   Number of children: Not on file   Years of education: Not on file   Highest education level: Not on file  Occupational History    Not on file  Tobacco Use   Smoking status: Never   Smokeless tobacco: Never  Vaping Use   Vaping Use: Never used  Substance and Sexual Activity   Alcohol use: Yes    Alcohol/week: 1.0 - 2.0 standard drink    Types: 1 - 2 Glasses of wine per week    Comment: rare   Drug use: No   Sexual activity: Yes  Other Topics Concern   Not on file  Social History Narrative   Not on file   Social Determinants of Health   Financial Resource Strain: Medium Risk   Difficulty of Paying Living Expenses: Somewhat hard  Food Insecurity: No Food Insecurity   Worried About Charity fundraiser in the Last Year: Never true   Ran Out of Food in the Last Year: Never true  Transportation Needs: No Transportation Needs   Lack of Transportation (Medical): No   Lack of Transportation (Non-Medical): No  Physical Activity: Not on file  Stress: Not on file  Social Connections: Not on file  Intimate Partner Violence: Not on file    Allergies  Allergen Reactions   Lisinopril Cough    Family History  Problem Relation Age of Onset   Hypertension Mother    Healthy Father    Stroke Father     Prior to Admission medications   Medication Sig Start Date End Date Taking? Authorizing Provider  amiodarone (PACERONE) 200 MG tablet Take 1 tablet (200 mg total) by mouth 2 (two) times daily. 09/11/20   Clegg, Amy D, NP  atorvastatin (LIPITOR) 80 MG tablet Take 1 tablet (80 mg total) by mouth at bedtime. 03/18/20   Larey Dresser, MD  carvedilol (COREG) 12.5 MG tablet Take 1 tablet (12.5 mg total) by mouth 2 (two) times daily. 09/11/20 09/11/21  Darrick Grinder D, NP  dapagliflozin propanediol (FARXIGA) 10 MG TABS tablet Take 1 tablet (10 mg total) by mouth daily before breakfast. 04/16/20   [place], Maricela Bo, FNP  dicyclomine (BENTYL) 20 MG tablet Take 1 tablet (20 mg total) by mouth 2 (two) times daily. Patient not taking: Reported on 09/16/2020 06/23/20   Marcello Fennel, PA-C  ELIQUIS 5 MG TABS tablet TAKE 1 TABLET(5  MG) BY MOUTH TWICE DAILY 09/09/20   Larey Dresser, MD  famotidine (PEPCID) 20 MG tablet Take 1 tablet (20 mg total) by mouth 2 (two) times daily. 09/29/20   Carmin Muskrat, MD  fenofibrate 54 MG tablet TAKE 1 TABLET (54 MG TOTAL) BY MOUTH DAILY. Patient taking differently: Take 54 mg by mouth in the morning. 07/06/20 10/04/20  Larey Dresser, MD  furosemide (LASIX) 20 MG tablet Take 2 tablets (40 mg total) by mouth daily. 09/11/20 10/11/20  Clegg, Amy D, NP  hydrALAZINE (APRESOLINE) 100 MG tablet Take 0.5 tablets (50 mg total) by mouth 3 (three) times daily. 09/11/20   Conrad [place], NP  insulin glargine (LANTUS) 100 UNIT/ML injection Inject 0.24 mLs (24 Units total) into the skin daily. Patient taking differently: Inject 24 Units into the skin in the morning. 02/04/20   Raiford Noble Latif, DO  isosorbide mononitrate (IMDUR) 60 MG 24 hr tablet Take 1 tablet (60 mg total) by mouth daily. Patient taking differently: Take 60 mg by mouth in the morning. 04/09/20   Larey Dresser, MD  Lancets MISC 1 each by Does not apply route 3 (three) times daily as needed. 03/11/20   [name], FNP  latanoprost (XALATAN) 0.005 % ophthalmic solution Place 1 drop into both eyes at bedtime. 01/20/20   [provider]  meclizine (ANTIVERT) 25 MG tablet Take 1 tablet (25 mg total) by mouth 3 (three) times daily as needed for dizziness. Patient taking differently: Take 25 mg by mouth 3 (three) times daily. 07/14/20   [name], FNP  ondansetron (ZOFRAN ODT) 4 MG disintegrating tablet Take 1 tablet (4 mg total) by mouth every 8 (eight) hours as needed for nausea or vomiting. 09/29/20   Carmin Muskrat, MD  pantoprazole (PROTONIX) 40 MG tablet Take 1 tablet (40 mg total) by mouth 2 (two) times daily. 03/25/20   [name], FNP  sertraline (ZOLOFT) 50 MG tablet Take 1 tablet (50 mg total) by mouth daily. 09/29/20   Carmin Muskrat, MD  sucralfate (CARAFATE) 1 g tablet Take 1 tablet (1 g total) by  mouth 4 (four) times daily -  with meals and at bedtime. 09/29/20   Carmin Muskrat, MD  VASCEPA 1 g capsule TAKE TWO CAPSULES BY MOUTH TWICE A DAY Patient taking differently: Take 2 g by mouth in the morning and at bedtime. 07/06/20   Larey Dresser, MD    Physical Exam: Vitals:   09/30/20 1313 09/30/20 1430 09/30/20 1445 09/30/20 1446  BP: (!) 142/74 (!) 168/99 (!) 145/88   Pulse: 86 81 81   Resp: '17 19 15   '$ Temp: 98.4 F (36.9 C)   98.4 F (36.9 C)  TempSrc: Oral   Oral  SpO2: 100% 100% 100%   Weight:      Height:         General:  Appears calm and comfortable and is in NAD Eyes:  PERRL, EOMI, normal lids, iris ENT:  deaf.  Normal  lips & tongue, mmm; appropriate dentition Neck:  no LAD, masses or thyromegaly; no carotid bruits Cardiovascular:  RRR, no m/r/g. No LE edema.  Respiratory:   CTA bilaterally with no wheezes/rales/rhonchi.  Normal respiratory effort. Abdomen:  soft, NT, ND, NABS Back:   normal alignment, no CVAT Skin:  no rash or induration seen on limited exam Musculoskeletal:  grossly normal tone BUE/BLE, good ROM, no bony abnormality Lower extremity:  No LE edema.  Limited foot exam with no ulcerations.  2+ distal pulses. Strength: decreased on right upper and lower extremity. 4/5 on upper, 3-4/5 on right lower. 5/5 strength on left upper and lower extremity. Hand grip decreased on right hand. Sensation minimally affected on right. He can feel it, just "feels different." Psychiatric:  grossly normal mood and affect, AOx3 Neurologic:  CN 2-12 grossly intact, moves all extremities in coordinated fashion, sensation intact    Radiological Exams on Admission: Independently reviewed - see discussion in A/P where applicable  DG Chest 2 View  Result Date: 09/29/2020 CLINICAL DATA:  Shortness of breath. EXAM: CHEST - 2 VIEW COMPARISON:  09/11/2020 FINDINGS: The right ventricular pacer wire is stable. The CardioMEMS  pulmonary artery pressure device is again noted.  The heart is borderline enlarged but unchanged. The mediastinal and hilar contours are within normal limits and unchanged. No acute pulmonary findings. No infiltrates or effusions. No pneumothorax. The bony thorax is intact. IMPRESSION: No acute cardiopulmonary findings. Electronically Signed   By: Marijo Sanes M.D.   On: 09/29/2020 11:56   CT HEAD CODE STROKE WO CONTRAST  Result Date: 09/30/2020 CLINICAL DATA:  Code stroke EXAM: CT HEAD WITHOUT CONTRAST CT ANGIOGRAPHY OF THE HEAD AND NECK TECHNIQUE: Contiguous axial images were obtained from the base of the skull through the vertex without intravenous contrast. Multidetector CT imaging of the head and neck was performed using the standard protocol during bolus administration of intravenous contrast. Multiplanar CT image reconstructions and MIPs were obtained to evaluate the vascular anatomy. Carotid stenosis measurements (when applicable) are obtained utilizing NASCET criteria, using the distal internal carotid diameter as the denominator. CONTRAST:  133m OMNIPAQUE IOHEXOL 350 MG/ML SOLN COMPARISON:  05/05/2020 CT head FINDINGS: CT HEAD Brain: There is no acute intracranial hemorrhage, mass effect, or edema. Gray-white differentiation is preserved. There is no extra-axial fluid collection. Ventricles and sulci are stable in size and configuration. Vascular: No hyperdense vessel. Skull: Calvarium is unremarkable. Sinuses/Orbits: No acute finding. Other: None. Review of the MIP images confirms the above findings ASPECTS ([facility] Early CT Score) - Ganglionic level infarction (caudate, lentiform nuclei, internal capsule, insula, M1-M3 cortex): 7 - Supraganglionic infarction (M4-M6 cortex): 3 Total score (0-10 with 10 being normal): 10 CTA NECK Aortic arch: Great vessel origins are patent. Right carotid system: Patent. No stenosis or evidence of dissection. Left carotid system: Patent.  No stenosis or evidence of dissection. Vertebral arteries:  Patent and nearly codominant. No stenosis or evidence of dissection. Skeleton: No significant osseous abnormality. Other neck: Unremarkable. Upper chest: Included upper lobes are clear.  Left chest wall ICD. Review of the MIP images confirms the above findings CTA HEAD Anterior circulation: Intracranial internal carotid arteries are patent. There is moderate narrowing of the left distal cavernous and paraclinoid ICA presumably secondary to circumferential noncalcified plaque. Anterior cerebral arteries are patent. Anterior communicating artery is present. Middle cerebral arteries are patent. Posterior circulation: Intracranial vertebral arteries are patent. Basilar artery is patent. Major cerebellar artery origins are patent. Posterior cerebral arteries are patent. Venous sinuses: Patent as allowed by contrast bolus timing. Review of the MIP images confirms the above findings IMPRESSION: There is no acute intracranial hemorrhage or evidence of acute infarction. ASPECT score is 10. No large vessel occlusion. Moderate narrowing of the distal left cavernous and paraclinoid ICA presumably secondary to circumferential noncalcified plaque. Initial results were communicated to Dr. KLorrin Goodellat 12:58 pm on 09/30/2020 by text page via the [facility]. Electronically Signed   By: PMacy MisM.D.   On: 09/30/2020 13:16   CT ANGIO HEAD NECK W WO CM (CODE STROKE)  Result Date: 09/30/2020 CLINICAL DATA:  Code stroke EXAM: CT HEAD WITHOUT CONTRAST CT ANGIOGRAPHY OF THE HEAD AND NECK TECHNIQUE: Contiguous axial images were obtained from the base of the skull through the vertex without intravenous contrast. Multidetector CT imaging of the head and neck was performed using the standard protocol during bolus administration of intravenous contrast. Multiplanar CT image reconstructions and MIPs were obtained to evaluate the vascular anatomy. Carotid stenosis measurements (when applicable) are obtained utilizing NASCET  criteria, using the distal internal carotid diameter as the denominator. CONTRAST:  1038mOMNIPAQUE IOHEXOL 350 MG/ML SOLN COMPARISON:  05/05/2020 CT head FINDINGS: CT HEAD Brain: There is no acute intracranial hemorrhage, mass effect, or edema. Gray-white differentiation is preserved. There is no extra-axial fluid collection. Ventricles and sulci are stable in size and configuration. Vascular: No hyperdense vessel. Skull: Calvarium is unremarkable. Sinuses/Orbits: No acute finding. Other: None. Review of the MIP images confirms the above findings ASPECTS [facility] Stroke Program Early CT Score) - Ganglionic level infarction (caudate, lentiform nuclei, internal capsule, insula, M1-M3 cortex): 7 - Supraganglionic infarction (M4-M6 cortex): 3 Total score (0-10 with 10 being normal): 10 CTA NECK Aortic arch: Great vessel origins are patent. Right carotid system: Patent. No stenosis or evidence of dissection. Left carotid system: Patent.  No stenosis or evidence of dissection. Vertebral arteries: Patent and nearly codominant. No stenosis or evidence of dissection. Skeleton: No significant osseous abnormality. Other neck: Unremarkable. Upper chest: Included upper lobes are clear.  Left chest wall ICD. Review of the MIP images confirms the above findings CTA HEAD Anterior circulation: Intracranial internal carotid arteries are patent. There is moderate narrowing of the left distal cavernous and paraclinoid ICA presumably secondary to circumferential noncalcified plaque. Anterior cerebral arteries are patent. Anterior communicating artery is present. Middle cerebral arteries are patent. Posterior circulation: Intracranial vertebral arteries are patent. Basilar artery is patent. Major cerebellar artery origins are patent. Posterior cerebral arteries are patent. Venous sinuses: Patent as allowed by contrast bolus timing. Review of the MIP images confirms the above findings IMPRESSION: There is no acute intracranial hemorrhage  or evidence of acute infarction. ASPECT score is 10. No large vessel occlusion. Moderate narrowing of the distal left cavernous and paraclinoid ICA presumably secondary to circumferential noncalcified plaque. Initial results were communicated to Dr. Lorrin Goodell at 12:58 pm on 09/30/2020 by text page via the [facility] messaging system. Electronically Signed   By: Macy Mis M.D.   On: 09/30/2020 13:16    EKG: Independently reviewed.  NSR with rate 87; nonspecific ST changes with no evidence of acute ischemia. Prolonged QT   Labs on Admission: I have personally reviewed the available labs and imaging studies at the time of the admission.  Pertinent labs:  Sodium: 129 Glucose: 434 BUN: 32 and creatinine: 2.59  (baseline: 2.08-3.11) AST: 44, t.bili: 1.4 Hgb: 11.1 (10.8-12.2) Platelets: 131 Bnp: 335  (baseline 333)  CT head: no acute infarction/hemorrhage CTA head/neck: No LVO   Assessment/Plan Principal Problem:   Unilateral weakness -rule out stroke/TIA. Multiple risks.  Neurology consulted in ER and is following. ? Lacunar stroke.  -echo pending. Can not order MRI due to ICD so have ordered repeat CTH at 24 hours.  -continue eliquis, no antiplatelet.  -OT/PT ordered.  -neuro checks/tele  -maximize management/control of other co morbidities that increase risk of stroke.   Active Problems:   HTN (hypertension) -permissive HTN over next 24 hours. Has been reasonably well controlled. Hx of hypotension in past and medication has been titrated down on last hospitalization.  -resume home hydralazine, imdur tomorrow.     Chronic systolic CHF (congestive heart failure) (HCC) -no signs of clinical volume overload -echo pending  -BNP at baseline -strict I/O and daily weights.  -continue home lasix, coreg. -no entresto/spironolactone due to CKD.  -continue farxiga on d/c.     NICM (nonischemic cardiomyopathy) ([place]) -ICD. Continue tele    Type 2 diabetes mellitus with complication, with  long-term current use of insulin (HCC) -recent a1c of 10.3. poor control.  -continue home lantus and SSI/accuchecks ordered  -discussed increases risk of stroke with poor glycemic control.  Will order diabetic education for him as well. Encourage physical exercise/weight loss.  -resume farxiga on d/c     History of atrial fibrillation -NSR. Continue tele and his amiodarone/eliquis.     CKD (chronic kidney disease), stage IV (HCC) -creatinine has trended down from 3.15--->2.59 today.  -was supposed to see nephrology outpatient, has not done.  -needs better control of diabetes.  -continue to monitor     Hyperlipidemia Continue statin, vascepa and fenofibrate.  Lipid panel pending, but 04/2020 ldl of 76    Obstructive sleep apnea -cpap at night    History of DVT (deep vein thrombosis) -unprovoked in 07/2019. Had recurrent PE in 10-/21, but had been off eliquis.  -continue eliquis. Consider outpatient heme referral for hypercoag w/u.      Major depressive disorder, recurrent episode ([place]) -seen yesterday for suicide ideation, but does not endorse any si/hi/ah/vh. Has not started zoloft, so will start this inpatient today after clears swallow test.   Hyponatremia -corrected with glucose.   Anemia Around baseline  Likely ACD from CKD Will check iron studies Encourage colon cancer screening outpatient if not done  Positive covid 19 -was positive for covid 6/3 during last hospitalization. Should not have been tested. 20 days out of covid and was asymptomatic at that time.  -if stroke w/u negative. ? If covid could be contributing to symptoms.   GERD -continue pepcid and protonix.   Prolonged qt -new on ekg. ? If due to amiodarone since new drug on higher dose.   -avoid qt prolonging drugs. On tele. Repeat ekg in AM -on low dose zoloft and will need to monitor this.    Body mass index is 33.28 kg/m.    Level of care: Telemetry Cardiac DVT prophylaxis: eliquis  Code  Status:  Full - confirmed with patient Family Communication: None present Disposition Plan:  The patient is from: home  Anticipated d/c is to: home after stroke w/u has been complete and to optimize medical management of other co morbidities.  Consults called: neurology in ER   Admission status:  inpatient    Orma Flaming MD [facility]   How to contact the [facility] Attending or Consulting provider [place] or covering provider during after hours [place], for this patient?  Check the care team in [facility] and look for a) attending/consulting [facility] provider listed and b) the [facility] team listed Log into [email or web address] and use [place]'s universal password to access. If you do not have the password, please contact the hospital operator. Locate the [facility] provider you are looking for under [facility] and page to a number that you can be directly reached. If you still have difficulty reaching the provider, please page the [facility] (Director on Call) for the Hospitalists listed on amion for assistance.   09/30/2020, 4:20 PM

## 2020-10-01 ENCOUNTER — Inpatient Hospital Stay (HOSPITAL_COMMUNITY): Payer: Medicare Other

## 2020-10-01 DIAGNOSIS — E785 Hyperlipidemia, unspecified: Secondary | ICD-10-CM

## 2020-10-01 DIAGNOSIS — I482 Chronic atrial fibrillation, unspecified: Secondary | ICD-10-CM

## 2020-10-01 DIAGNOSIS — E1140 Type 2 diabetes mellitus with diabetic neuropathy, unspecified: Secondary | ICD-10-CM

## 2020-10-01 DIAGNOSIS — I5022 Chronic systolic (congestive) heart failure: Secondary | ICD-10-CM

## 2020-10-01 DIAGNOSIS — N184 Chronic kidney disease, stage 4 (severe): Secondary | ICD-10-CM

## 2020-10-01 DIAGNOSIS — I6330 Cerebral infarction due to thrombosis of unspecified cerebral artery: Secondary | ICD-10-CM | POA: Insufficient documentation

## 2020-10-01 LAB — COMPREHENSIVE METABOLIC PANEL
ALT: 34 U/L (ref 0–44)
AST: 28 U/L (ref 15–41)
Albumin: 3.1 g/dL — ABNORMAL LOW (ref 3.5–5.0)
Alkaline Phosphatase: 49 U/L (ref 38–126)
Anion gap: 15 (ref 5–15)
BUN: 32 mg/dL — ABNORMAL HIGH (ref 6–20)
CO2: 25 mmol/L (ref 22–32)
Calcium: 9.4 mg/dL (ref 8.9–10.3)
Chloride: 92 mmol/L — ABNORMAL LOW (ref 98–111)
Creatinine, Ser: 2.56 mg/dL — ABNORMAL HIGH (ref 0.61–1.24)
GFR, Estimated: 29 mL/min — ABNORMAL LOW (ref >60)
Glucose, Bld: 285 mg/dL — ABNORMAL HIGH (ref 70–99)
Potassium: 3.9 mmol/L (ref 3.5–5.1)
Sodium: 132 mmol/L — ABNORMAL LOW (ref 135–145)
Total Bilirubin: 1.3 mg/dL — ABNORMAL HIGH (ref 0.3–1.2)
Total Protein: 6.6 g/dL (ref 6.5–8.1)

## 2020-10-01 LAB — LDL CHOLESTEROL, DIRECT: Direct LDL: 189.9 mg/dL — ABNORMAL HIGH (ref 0–99)

## 2020-10-01 LAB — CBC WITH DIFFERENTIAL/PLATELET
Abs Immature Granulocytes: 0.02 10*3/uL (ref 0.00–0.07)
Basophils Absolute: 0 10*3/uL (ref 0.0–0.1)
Basophils Relative: 0 %
Eosinophils Absolute: 0.1 10*3/uL (ref 0.0–0.5)
Eosinophils Relative: 2 %
HCT: 35.2 % — ABNORMAL LOW (ref 39.0–52.0)
Hemoglobin: 11.2 g/dL — ABNORMAL LOW (ref 13.0–17.0)
Immature Granulocytes: 0 %
Lymphocytes Relative: 15 %
Lymphs Abs: 0.8 10*3/uL (ref 0.7–4.0)
MCH: 29.1 pg (ref 26.0–34.0)
MCHC: 31.8 g/dL (ref 30.0–36.0)
MCV: 91.4 fL (ref 80.0–100.0)
Monocytes Absolute: 0.6 10*3/uL (ref 0.1–1.0)
Monocytes Relative: 12 %
Neutro Abs: 3.7 10*3/uL (ref 1.7–7.7)
Neutrophils Relative %: 71 %
Platelets: 115 10*3/uL — ABNORMAL LOW (ref 150–400)
RBC: 3.85 MIL/uL — ABNORMAL LOW (ref 4.22–5.81)
RDW: 13.5 % (ref 11.5–15.5)
WBC: 5.3 10*3/uL (ref 4.0–10.5)
nRBC: 0 % (ref 0.0–0.2)

## 2020-10-01 LAB — GLUCOSE, CAPILLARY
Glucose-Capillary: 237 mg/dL — ABNORMAL HIGH (ref 70–99)
Glucose-Capillary: 241 mg/dL — ABNORMAL HIGH (ref 70–99)
Glucose-Capillary: 378 mg/dL — ABNORMAL HIGH (ref 70–99)
Glucose-Capillary: 190 mg/dL — ABNORMAL HIGH (ref 70–99)

## 2020-10-01 LAB — LIPID PANEL
Cholesterol: 355 mg/dL — ABNORMAL HIGH (ref 0–200)
HDL: 46 mg/dL (ref >40)
LDL Cholesterol: UNDETERMINED mg/dL (ref 0–99)
Total CHOL/HDL Ratio: 7.7 RATIO
Triglycerides: 471 mg/dL — ABNORMAL HIGH (ref <150)
VLDL: UNDETERMINED mg/dL (ref 0–40)

## 2020-10-01 MED ORDER — ONDANSETRON HCL 4 MG/2ML IJ SOLN
4.0000 mg | Freq: Once | INTRAMUSCULAR | Status: AC
  Administered 2020-10-01: 4 mg via INTRAVENOUS
  Filled 2020-10-01: qty 2

## 2020-10-01 MED ORDER — SODIUM CHLORIDE 0.9 % IV SOLN: INTRAVENOUS | Status: DC

## 2020-10-01 MED ORDER — HYDROMORPHONE HCL 1 MG/ML IJ SOLN
1.0000 mg | Freq: Once | INTRAMUSCULAR | Status: AC
  Administered 2020-10-01: 1 mg via INTRAVENOUS
  Filled 2020-10-01: qty 1

## 2020-10-01 MED ORDER — ISOSORBIDE MONONITRATE ER 60 MG PO TB24
60.0000 mg | ORAL_TABLET | Freq: Every morning | ORAL | Status: DC
  Administered 2020-10-02 – 2020-10-03 (×2): 60 mg via ORAL
  Filled 2020-10-01 (×2): qty 1

## 2020-10-01 MED ORDER — FENTANYL CITRATE (PF) 100 MCG/2ML IJ SOLN
37.5000 ug | Freq: Once | INTRAMUSCULAR | Status: AC
  Administered 2020-10-01: 37.5 ug via INTRAVENOUS
  Filled 2020-10-01: qty 2

## 2020-10-01 MED ORDER — FUROSEMIDE 40 MG PO TABS
40.0000 mg | ORAL_TABLET | Freq: Every day | ORAL | Status: DC
  Administered 2020-10-02 – 2020-10-03 (×2): 40 mg via ORAL
  Filled 2020-10-01 (×2): qty 1

## 2020-10-01 MED ORDER — HYDRALAZINE HCL 50 MG PO TABS
50.0000 mg | ORAL_TABLET | Freq: Three times a day (TID) | ORAL | Status: DC
  Administered 2020-10-01 – 2020-10-03 (×6): 50 mg via ORAL
  Filled 2020-10-01 (×6): qty 1

## 2020-10-01 NOTE — Progress Notes (Signed)
[facility] night shift.  The nursing staff reports that the patient is having 9 out of 10 abdominal pain despite using dicyclomine 20 mg earlier.  Fentanyl 37.5 mcg IVP x1 dose ordered.  Tennis Must, MD.

## 2020-10-01 NOTE — Progress Notes (Signed)
Inpatient Diabetes Program Recommendations  AACE/ADA: New Consensus Statement on Inpatient Glycemic Control (2015)  Target Ranges:  Prepandial:   less than 140 mg/dL      Peak postprandial:   less than 180 mg/dL (1-2 hours)      Critically ill patients:  140 - 180 mg/dL   Lab Results  Component Value Date   GLUCAP 241 (H) 10/01/2020   HGBA1C 9.7 (H) 09/30/2020    Review of Glycemic Control  Diabetes history: DM2 Outpatient Diabetes medications: Lantus 24 units QAM, Farxiga 10 mg QAM Current orders for Inpatient glycemic control: Lantus 24 units QAM, Novolog 0-9 units TID with meals  9.7% First Lantus dose this am.  Post-prandials elevated. May benefit from addition of meal coverage insulin  Inpatient Diabetes Program Recommendations:    Add Novolog 3 units TID with meals if pt eats > 50% meal.  Spoke with pt using the sign-language interpreter on [place]. Pt states he doesn't always take his Lantus. Said the copay of $20 was expensive. Checks blood sugars almost every day. Has not been to MD in awhile. States his doctor has left and he needs to call and get another one. Discussed healthy diet and importance of portion control and reducing high sugar beverages. Discussed HgbA1C of 9.7% and importance of getting his diabetes under control to reduce risks of long and short-term complications. Discussed weight loss of just a few pounds will help with blood sugar control. Did not appear to be very motivated in taking care of himself.   Continue to follow.   Thank you. Lorenda Peck, RD, LDN, CDE Inpatient Diabetes Coordinator [phone]

## 2020-10-01 NOTE — Evaluation (Addendum)
Occupational Therapy Treatment Patient Details Name: Troy Rush MRN: [id number] DOB: 03/05/1971 Today's Date: 10/01/2020    History of present illness 50 y/o male presented to ED on 6/22 as code stroke for c/o R sided numbness, weakness, and dizziness. Recently seen at [facility] ED on 6/21 for N/V and suicidal ideation. CT head (-) for acute abnormalities. Tested COVID+ on 09/11/20. PMH: HLD, CHF, HTN, Afib, type 2 DM, deaf, depression, CKD stage 4, OSA   OT comments  PTA, pt was living alone and was independent. Pt currently requiring Min A +2 for LB ADLs and functional mobility due to decreased balance, coordination, sensation, and activity tolerance. During short distance mobility, pt reporting dizziness and fatigue (closing his eyes and less engaged in conversation). BP soft; notified RN. Despite dizziness and fatigue, pt very motivated to participate. Pt will require further acute OT to optimize safety and independence with ADLs. Due to pt's age, PLOF, change in functional status, recommend dc to CIR for intensive OT to reach Mod I level prior to return home.   Sitting after walking 79/57 (65) Standing 69/49 (56) Sitting 92/59 (71) In recliner after a few min 90/58 (69).    Follow Up Recommendations  CIR    Equipment Recommendations  3 in 1 bedside commode    Recommendations for Other Services PT consult    Precautions / Restrictions Precautions Precautions: Fall;Other (comment) Precaution Comments: watch BP       Mobility Bed Mobility               General bed mobility comments: In the recliner upon arrival    Transfers Overall transfer level: Needs assistance Equipment used: 1 person hand held assist Transfers: Sit to/from Stand Sit to Stand: Min assist;+2 physical assistance         General transfer comment: Min A for power up and gaining balance in standing    Balance Overall balance assessment: Needs assistance Sitting-balance support: No upper extremity  supported;Feet supported Sitting balance-Leahy Scale: Fair     Standing balance support: Bilateral upper extremity supported;During functional activity;Single extremity supported Standing balance-Leahy Scale: Poor                             ADL either performed or assessed with clinical judgement   ADL Overall ADL's : Needs assistance/impaired Eating/Feeding: Set up;Sitting   Grooming: Set up;Sitting   Upper Body Bathing: Minimal assistance;Sitting   Lower Body Bathing: Minimal assistance;+2 for physical assistance;Sit to/from stand   Upper Body Dressing : Minimal assistance;Sitting   Lower Body Dressing: Minimal assistance;Sit to/from stand;+2 for physical assistance   Toilet Transfer: Minimal assistance;+2 for physical assistance;Ambulation;RW (simulated to straight back chair) Toilet Transfer Details (indicate cue type and reason): Min A for power up and gaining balance         Functional mobility during ADLs: Minimal assistance;+2 for physical assistance;Rolling walker General ADL Comments: Pt presenting with decreased strength, coorindation, and sensation at R side. Pt also with dizziness during activity and BP soft.     Vision       Perception     Praxis      Cognition Arousal/Alertness: Awake/alert;Lethargic Behavior During Therapy: WFL for tasks assessed/performed Overall Cognitive Status: Within Functional Limits for tasks assessed                                 General Comments:  Pt going in and out of lethargy. Possibly due to BP and hypotension        Exercises     Shoulder Instructions       General Comments Sitting after walking 79/57 (65); Standing 69/49 (56); Sitting 92/59 (71); In recliner after a few min(BLEs elevated) 90/58 (69). Pt reporting dizziness and becoming less engaged during activity.    Pertinent Vitals/ Pain       Pain Assessment: No/denies pain  Home Living Family/patient expects to be discharged  to:: Private residence Living Arrangements: Alone Available Help at Discharge: Family;Friend(s);Available PRN/intermittently Type of Home: Apartment Home Access: Stairs to enter Entrance Stairs-Number of Steps: 2 Entrance Stairs-Rails: Right;Left;Can reach both Home Layout: One level         Biochemist, clinical: Standard     Home Equipment: None          Prior Functioning/Environment Level of Independence: Independent            Frequency  Min 2X/week        Progress Toward Goals  OT Goals(current goals can now be found in the care plan section)     Acute Rehab OT Goals Patient Stated Goal: Get better OT Goal Formulation: With patient Time For Goal Achievement: 10/15/20 Potential to Achieve Goals: Good  Plan      Co-evaluation                 AM-PAC OT "6 Clicks" Daily Activity     Outcome Measure   Help from another person eating meals?: None Help from another person taking care of personal grooming?: A Little Help from another person toileting, which includes using toliet, bedpan, or urinal?: A Little Help from another person bathing (including washing, rinsing, drying)?: A Little Help from another person to put on and taking off regular upper body clothing?: A Little Help from another person to put on and taking off regular lower body clothing?: A Little 6 Click Score: 19    End of Session Equipment Utilized During Treatment: Rolling walker;Gait belt  OT Visit Diagnosis: Unsteadiness on feet (R26.81);Other abnormalities of gait and mobility (R26.89);Muscle weakness (generalized) (M62.81)   Activity Tolerance Other (comment) (Limited by dizziness)   Patient Left in bed;with call bell/phone within reach   Nurse Communication Mobility status;Other (comment) (soft BP and dizziness)        Time: [id number] OT Time Calculation (min): 25 min  Charges: OT General Charges $OT Visit: 1 Visit OT Evaluation $OT Eval Moderate Complexity: [place], OTR/L Acute Rehab Pager: [phone] Office: [place] 10/01/2020, 5:00 PM

## 2020-10-01 NOTE — Progress Notes (Signed)
RT Note Unable to start CPAP due to patient being Covid + in a non- negative pressure room.  RT will continue to monitor

## 2020-10-01 NOTE — TOC Initial Note (Signed)
Transition of Care [facility]) - Initial/Assessment Note    Patient Details  Name: Troy Rush MRN: [id number] Date of Birth: March 16, 1971  Transition of Care [facility]) CM/SW Contact:    Pollie Friar, RN Phone Number: 10/01/2020, 3:58 PM  Clinical Narrative:      Using interpreter services:             Patient lives home alone. He states he has no one close to assist or check in on him. He does state that Katie from the HF clinic program checks on him once a week and refills his medications.  He has no DME at home.  He uses either Medicaid transportation or the HF clinic helps with transport for appointments.  Awaiting PT/OT evals.  TOC following.  Expected Discharge Plan: Home/Self Care Barriers to Discharge: Continued Medical Work up   Patient Goals and CMS Choice        Expected Discharge Plan and Services Expected Discharge Plan: Home/Self Care   Discharge Planning Services: CM Consult   Living arrangements for the past 2 months: Apartment                                      Prior Living Arrangements/Services Living arrangements for the past 2 months: Apartment Lives with:: Self Patient language and need for interpreter reviewed:: Yes (sign language interpreter used) Do you feel safe going back to the place where you live?: Yes        Care giver support system in place?: No (comment)   Criminal Activity/Legal Involvement Pertinent to Current Situation/Hospitalization: No - Comment as needed  Activities of Daily Living Home Assistive Devices/Equipment: Walker (specify type) ADL Screening (condition at time of admission) Patient's cognitive ability adequate to safely complete daily activities?: Yes Is the patient deaf or have difficulty hearing?: Yes Does the patient have difficulty seeing, even when wearing glasses/contacts?: No Does the patient have difficulty concentrating, remembering, or making decisions?: No Patient able to express need for assistance  with ADLs?: Yes Does the patient have difficulty dressing or bathing?: No Independently performs ADLs?: Yes (appropriate for developmental age) Does the patient have difficulty walking or climbing stairs?: Yes Weakness of Legs: Right Weakness of Arms/Hands: Right  Permission Sought/Granted                  Emotional Assessment Appearance:: Appears stated age Attitude/Demeanor/Rapport: Engaged Affect (typically observed): Accepting Orientation: : Oriented to Self, Oriented to Place, Oriented to  Time, Oriented to Situation   Psych Involvement: No (comment)  Admission diagnosis:  Numbness [R20.0] Weakness [R53.1] Unilateral weakness [R53.1] Patient Active Problem List   Diagnosis Date Noted   Cerebral thrombosis with cerebral infarction 10/01/2020   Unilateral weakness 09/30/2020   History of atrial fibrillation 09/30/2020   History of DVT (deep vein thrombosis) 09/30/2020   CKD (chronic kidney disease) stage 3, GFR 30-59 ml/min (HCC) 07/08/2020   Acute CHF (congestive heart failure) ([place]) 07/07/2020   CKD (chronic kidney disease), stage IV ([place]) 07/07/2020   ICD (implantable cardioverter-defibrillator) in place 05/14/2020   Pulmonary embolism ([place]) 02/05/2020   Acute kidney injury superimposed on CKD ([place])    Chronic combined systolic and diastolic heart failure ([place])    Atrial fibrillation with RVR ([place]) 02/01/2020   NSVT (nonsustained ventricular tachycardia) ([place]) 02/01/2020   Acute GI bleeding 02/01/2020   DKA (diabetic ketoacidosis) ([place]) 02/01/2020   Bilateral deafness 08/15/2019  Type 2 diabetes mellitus with complication, with long-term current use of insulin ([place]) 08/15/2019   Hemoglobin A1C between 7% and 9% indicating borderline diabetic control 08/15/2019   Insomnia 08/15/2019   Abdominal pain 11/14/2018   Ischemic cardiomyopathy 10/14/2016   NICM (nonischemic cardiomyopathy) ([place]) [id number]   Chronic systolic CHF (congestive heart failure) ([place])  05/30/2016   HTN (hypertension) 05/30/2016   Deaf 05/30/2016   Hyperkalemia 05/30/2016   Major depressive disorder, recurrent episode ([place]) 03/21/2016   Bronchitis 01/21/2015   Dyslipidemia 01/21/2015   Vitamin D deficiency 12/19/2011   Personal history of other infectious and parasitic diseases 10/25/2011   Anxiety disorder 05/12/2011   Hyperlipidemia 05/12/2011   Hypertriglyceridemia 05/12/2011   Obstructive sleep apnea 05/12/2011   PCP:  Pcp, No Pharmacy:   [place], [place] - [address] [place] [address] Phone: [phone] Fax: [phone]     Social Determinants of Health (SDOH) Interventions    Readmission Risk Interventions Readmission Risk Prevention Plan 07/09/2020 02/06/2020  Transportation Screening Complete -  PCP or Specialist Appt within 3-5 Days Complete Complete  HRI or Home Care Consult Complete Complete  Social Work Consult for [place] Planning/Counseling Complete Complete  Palliative Care Screening Complete Not Applicable  Medication Review Press photographer) Complete Complete  Some recent data might be hidden

## 2020-10-01 NOTE — Evaluation (Signed)
Physical Therapy Evaluation Patient Details Name: Troy Rush MRN: [id number] DOB: 03/12/1971 Today's Date: 10/01/2020   History of Present Illness  50 y/o male presented to ED on 6/22 as code stroke for c/o R sided numbness, weakness, and dizziness. Recently seen at [facility] ED on 6/21 for N/V and suicidal ideation. CT head (-) for acute abnormalities. Tested COVID+ on 09/11/20. PMH: HLD, CHF, HTN, Afib, type 2 DM, deaf, depression, CKD stage 4, OSA  Clinical Impression  PTA, patient lives alone and reports independence with mobility. Patient presents with R sided weakness, impaired R side sensation, impaired balance, decreased activity tolerance, and impaired coordination. Patient required minA+2 for short distance mobility with RW but reported dizziness and fatigue, less engaged with eyes closed, see below for vitals. Patient will benefit from skilled PT services during acute stay to address listed deficits. Recommend CIR for intensive therapies to assist with return to PLOF following functional decline.   Orthostatic BPs  Sitting 79/57 HR 65  Standing 69/49 HR 82  Sitting  92/59 HR 78  Standing after 5 min 90/58 HR 78       Follow Up Recommendations CIR    Equipment Recommendations  Rolling Morgen Ritacco with 5" wheels    Recommendations for Other Services       Precautions / Restrictions Precautions Precautions: Fall;Other (comment) Precaution Comments: watch BP Restrictions Weight Bearing Restrictions: No      Mobility  Bed Mobility               General bed mobility comments: In the recliner upon arrival    Transfers Overall transfer level: Needs assistance Equipment used: 1 person hand held assist Transfers: Sit to/from Stand Sit to Stand: Min assist;+2 physical assistance         General transfer comment: Min A for power up and gaining balance in standing  Ambulation/Gait Ambulation/Gait assistance: Min assist;+2 physical assistance;+2 safety/equipment Gait  Distance (Feet): 10 Feet Assistive device: Rolling Infinity Jeffords (2 wheeled) Gait Pattern/deviations: Step-to pattern;Decreased stride length;Decreased stance time - right;Decreased weight shift to right;Decreased step length - right;Trunk flexed;Wide base of support Gait velocity: decreased   General Gait Details: MinA+2 for balance and safety. Patient reporting dizziness with short ambulation distance, requiring seated rest break. Performed orthostatic vitals with + result.  Stairs            Wheelchair Mobility    Modified Rankin (Stroke Patients Only)       Balance Overall balance assessment: Needs assistance Sitting-balance support: No upper extremity supported;Feet supported Sitting balance-Leahy Scale: Fair     Standing balance support: Bilateral upper extremity supported;During functional activity;Single extremity supported Standing balance-Leahy Scale: Poor                               Pertinent Vitals/Pain Pain Assessment: No/denies pain    Home Living Family/patient expects to be discharged to:: Private residence Living Arrangements: Alone Available Help at Discharge: Family;Friend(s);Available PRN/intermittently Type of Home: Apartment Home Access: Stairs to enter Entrance Stairs-Rails: Right;Left;Can reach both Entrance Stairs-Number of Steps: 2 Home Layout: One level Home Equipment: None      Prior Function Level of Independence: Independent               Hand Dominance   Dominant Hand: Right    Extremity/Trunk Assessment   Upper Extremity Assessment Upper Extremity Assessment: Defer to OT evaluation RUE Deficits / Details: Reports his face, arm, and leg still  feel numb. Decreased strength compared to L. RUE Coordination: decreased gross motor    Lower Extremity Assessment Lower Extremity Assessment: RLE deficits/detail RLE Deficits / Details: strength WFL but decreased compared to L. Reports numbness in R LE RLE Coordination:  decreased gross motor    Cervical / Trunk Assessment Cervical / Trunk Assessment: Other exceptions (Reports numbness at R side of face)  Communication   Communication: Deaf;Prefers language other than English (ASL - OT intepretering)  Cognition Arousal/Alertness: Awake/alert;Lethargic Behavior During Therapy: WFL for tasks assessed/performed Overall Cognitive Status: Within Functional Limits for tasks assessed                                 General Comments: Pt going in and out of lethargy. Possibly due to BP and hypotension      General Comments General comments (skin integrity, edema, etc.): Sitting after walking 79/57 (65); Standing 69/49 (56); Sitting 92/59 (71); In recliner after a few min(BLEs elevated) 90/58 (69). Pt reporting dizziness and becoming less engaged during activity.    Exercises     Assessment/Plan    PT Assessment Patient needs continued PT services  PT Problem List Decreased strength;Decreased activity tolerance;Decreased balance;Decreased mobility;Decreased coordination;Impaired sensation       PT Treatment Interventions DME instruction;Gait training;Functional mobility training;Stair training;Therapeutic activities;Therapeutic exercise;Balance training;Patient/family education    PT Goals (Current goals can be found in the Care Plan section)  Acute Rehab PT Goals Patient Stated Goal: Get better PT Goal Formulation: With patient Time For Goal Achievement: 10/15/20 Potential to Achieve Goals: Good    Frequency Min 4X/week   Barriers to discharge        Co-evaluation               AM-PAC PT "6 Clicks" Mobility  Outcome Measure Help needed turning from your back to your side while in a flat bed without using bedrails?: A Little Help needed moving from lying on your back to sitting on the side of a flat bed without using bedrails?: A Little Help needed moving to and from a bed to a chair (including a wheelchair)?: A Little Help  needed standing up from a chair using your arms (e.g., wheelchair or bedside chair)?: A Little Help needed to walk in hospital room?: Total Help needed climbing 3-5 steps with a railing? : Total 6 Click Score: 14    End of Session Equipment Utilized During Treatment: Gait belt Activity Tolerance: Patient tolerated treatment well Patient left: in chair;with call bell/phone within reach Nurse Communication: Mobility status PT Visit Diagnosis: Unsteadiness on feet (R26.81);Muscle weakness (generalized) (M62.81);Other abnormalities of gait and mobility (R26.89);Other symptoms and signs involving the nervous system (R29.898)    Time: 1326-1350 PT Time Calculation (min) (ACUTE ONLY): 24 min   Charges:   PT Evaluation $PT Eval Moderate Complexity: 1 Mod PT Treatments $Therapeutic Activity: 8-22 mins        Shelma Eiben A. Gilford Rile PT, DPT Acute Rehabilitation Services Pager [phone] Office [phone]   Linna Hoff 10/01/2020, 5:22 PM

## 2020-10-01 NOTE — Progress Notes (Signed)
RT NOTE:  Unable to start CPAP due to patient being Covid + in a non- negative pressure room.

## 2020-10-01 NOTE — Progress Notes (Addendum)
STROKE TEAM PROGRESS NOTE   INTERVAL HISTORY Patient presented with right-sided weakness and numbness and symptoms have  slightly improved.  CT scan on admission showed no acute abnormality and CT angiogram shows no large vessel occlusion.  There is moderate narrowing of the distal left cavernous paraclinoid ICA but is not flow-limiting.  Repeat CT scan of the head is pending. Echocardiogram shows diminished ejection fraction of 35 to 40% with moderate decreased LV function and global hypokinesis.  No definite clot is noted.  Total cholesterol is elevated at 355 and triglycerides at 471 and LDL cholesterol could not be calculated.  Hemoglobin A1c is elevated at 9.7. Patient is deaf amd sign language intertpreeter was not present for this visit and patient followed pantomime commands well Vitals:   09/30/20 2138 10/01/20 0246 10/01/20 0700 10/01/20 1212  BP: (!) 155/83 (!) 176/105 131/73 125/79  Pulse: 87 86 79 79  Resp: '18 18 16 18  '$ Temp: 98.9 F (37.2 C) 98.6 F (37 C) 98.6 F (37 C) 98.3 F (36.8 C)  TempSrc: Oral Oral Oral Oral  SpO2: 100% 99% 99% 100%  Weight:      Height:       CBC:  Recent Labs  Lab 09/30/20 1237 10/01/20 0235  WBC 5.4 5.3  NEUTROABS 4.1 3.7  HGB 11.1* 11.2*  HCT 35.5* 35.2*  MCV 92.4 91.4  PLT 131* [id number]   Basic Metabolic Panel:  Recent Labs  Lab 09/30/20 1237 10/01/20 0235  NA 129* 132*  K 4.0 3.9  CL 93* 92*  CO2 23 25  GLUCOSE 434* 285*  BUN 32* 32*  CREATININE 2.59* 2.56*  CALCIUM 9.1 9.4   Lipid Panel:  Recent Labs  Lab 10/01/20 0235  CHOL 355*  TRIG 471*  HDL 46  CHOLHDL 7.7  VLDL UNABLE TO CALCULATE IF TRIGLYCERIDE OVER 400 mg/dL  LDLCALC UNABLE TO CALCULATE IF TRIGLYCERIDE OVER 400 mg/dL   HgbA1c:  Recent Labs  Lab 09/30/20 2204  HGBA1C 9.7*   Urine Drug Screen: No results for input(s): LABOPIA, COCAINSCRNUR, LABBENZ, AMPHETMU, THCU, LABBARB in the last 168 hours.  Alcohol Level No results for input(s): ETH in the last  168 hours.  IMAGING past 24 hours CT HEAD WO CONTRAST  Result Date: 10/01/2020 CLINICAL DATA:  Neuro deficit.  Acute stroke suspected.  Follow-up. EXAM: CT HEAD WITHOUT CONTRAST TECHNIQUE: Contiguous axial images were obtained from the base of the skull through the vertex without intravenous contrast. COMPARISON:  09/30/2020 FINDINGS: Brain: No evidence of acute infarction, hemorrhage, hydrocephalus, extra-axial collection or mass lesion/mass effect. Vascular: No hyperdense vessel or unexpected calcification. Skull: Normal. Negative for fracture or focal lesion. Sinuses/Orbits: No acute finding. Other: None. IMPRESSION: No acute intracranial abnormality. Electronically Signed   By: Nolon Nations M.D.   On: 10/01/2020 13:56    PHYSICAL EXAM General: Appears well-developed middle-age African-American male not in distress..  . Afebrile. Head is nontraumatic. Neck is supple without bruit.    Cardiac exam no murmur or gallop. Lungs are clear to auscultation. Distal pulses are well felt.  Neurological Exam : limited as no sign language interpreter present and patient is deaf.  Awake alert umable to speak. Mute and deaf.  Extraocular movements are full range without nystagmus.  Blinks to threat bilaterally.  Face is symmetric without weakness.  Tongue is midline.  Motor system exam shows no  drift with mild weakness of right grip and intrinsic hand muscles and orbits left over right upper extremity.  Trace weakness of  right hip flexors and ankle dorsiflexors.  Subjective diminished touch pinprick sensation in the right face, upper and lower extremities.  Coordination intact.  Gait not tested. ASSESSMENT/PLAN [name] is a 50 y.o. male with stroke risk factors of HTN, HLD, DM2, OSA, DVT/PE, AF with RVR, CHF, and nonischemic cardiomyopathy with ICD in place who presented as a Code Stroke for evaluation of Rt weakness and decreased sensation of the Rt hemibody. Patient not a tPA candidate due to full  anticoagulation on Eliquis. IR not activated for thrombectomy due to no LVO suspected from patient presentation or identified on CT angiography.   Suspect left subcortical infarct due to small vessel disease and patient with atrial fibrillation and cardiomyopathy on Eliquis code Stroke CT head No acute abnormality. Small vessel disease. ASPECTS 10.    CTA head & neck No LVO. Moderate narrowing of distal Lt cavernous and paraclinoid ICA. MRI  unable d/t ICD placement 2D Echo 40% EF, LV mod decreased function, LV global hypokinesis LDL UNABLE TO CALCULATE IF TRIGLYCERIDE OVER 400 mg/dL HgbA1c 9.7 VTE prophylaxis - On Eliquis    Diet   Diet heart healthy/carb modified Room service appropriate? Yes; Fluid consistency: Thin   Eliquis (apixaban) daily prior to admission, now on Eliquis (apixaban) daily.  Therapy recommendations:  pending Disposition:  pending  Hypertension Home meds:  Coreg, Apresoline, Imdur Stable Permissive hypertension (OK if < 220/120) but gradually normalize in 5-7 days Long-term BP goal normotensive  Mixed Hyperlipidemia/TG Home meds:  Lipitor '80mg'$ , fenofibrate '54mg'$ , Vascepa  LDL UNABLE TO CALCULATE IF TRIGLYCERIDE OVER 400 mg/dL, goal < 70 High intensity statin  Continue statin at discharge ?Compliance on home meds  Diabetes type II Uncontrolled Home meds:  Farxiga, Lantus HgbA1c 9.7, goal < 7.0 CBGs Recent Labs    10/01/20 0518 10/01/20 0838 10/01/20 1543  GLUCAP 237* 241* 378*    SSI  Other Stroke Risk Factors  Obesity, Body mass index is 33.28 kg/m., BMI >/= 30 associated with increased stroke risk, recommend weight loss, diet and exercise as appropriate  Coronary artery disease Obstructive sleep apnea, on CPAP at home Congestive heart failure  Other Active Problems CKD4 Heart failure- on Amiodarone, Coreg, Lasix, Imdur Afib- on Eliquis Depression- continue home [facility] day # 1  Desiree Metzger-Cihelka, ARNP-C, ANVP-BC Pager:  [phone]   I have personally obtained history,examined this patient, reviewed notes, independently viewed imaging studies, participated in medical decision making and plan of care.ROS completed by me personally and pertinent positives fully documented  I have made any additions or clarifications directly to the above note. Agree with note above.  Patient presented with right-sided weakness and numbness likely due to small subcortical infarct not visualized on CT scan.  Recommend repeat CT scan.  Continue Eliquis for stroke prevention for atrial fibrillation as there is no definite data that switching to Pradaxa or Xarelto is aggressive..  Continue aggressive risk factor modification with strict control of hypertension blood pressure goal below 130/90, diabetes with hemoglobin A1c goal below 6.5% and lipids with LDL cholesterol goal below 70 mg percent.  Patient counseled to eat a healthy diet to be active and lose weight..  Greater than 50% time during this 35-minute visit was spent in counseling and coordination of care about his stroke and discussion about stroke prevention and treatment and answering questions.  Antony Contras, MD Medical Director [facility] Pager: [phone] 10/01/2020 5:52 PM  To contact Stroke Continuity provider, please refer to [email or web address] After  hours, contact General Neurology

## 2020-10-01 NOTE — Progress Notes (Signed)
PROGRESS NOTE    Troy Rush  [id number] DOB: 1971-01-21 DOA: 09/30/2020 PCP: Merryl Hacker, No   Brief Narrative:  Troy Rush is a 50 y.o. male with medical history significant of hyperlipidemia, systolic CHF, HTN, atrial fibrillation, nonischemic cardiomyopathy with ICD, DM type2, deafness, hx of DVT/PE on eliquis, depression, CKD stage 4, OSA who presented to ER with concerns for a stroke.   He states he woke up and felt normal, but around 10:00Am he had dizziness and felt numb on the right side of his body. He wasn't able to walk due to the dizziness. He felt like his right leg felt frozen and he couldn't use it. He called his nurse and told him to call 911. He called EMS and stroke code was initiated. He states the right side of his body still feels weak and numb, but it has improved some. He can moved his right hand, but it's still hard to use his hand. He denies any drooping face. Has some tingling in his lips and his right side of his face almost has some pain like sensation.  He denies any headache, vision changes. He is not dizzy, but had an episode of dizziness earlier today. No chest pain, shortness of breath, palpitations. He has missed 3 days of his medication due to vomiting. He started taking medication today. No hx of stroke in the past. He is right handed.  He denies any fever/chills, coughing, leg swelling.    Recently seen at [facility] yesterday for SI and covid + and was started on zoloft; however, he has not started this. He denies any SI/HI/AH/VH. Denies any cough, fever, sneezing. Tested positive for covid at beginning of June.   Assessment & Plan:   Principal Problem:   Unilateral weakness Active Problems:   Major depressive disorder, recurrent episode (HCC)   Chronic systolic CHF (congestive heart failure) (HCC)   HTN (hypertension)   NICM (nonischemic cardiomyopathy) (HCC)   Type 2 diabetes mellitus with complication, with long-term current use of insulin (HCC)   CKD  (chronic kidney disease), stage IV (HCC)   Hyperlipidemia   Obstructive sleep apnea   History of atrial fibrillation   History of DVT (deep vein thrombosis)   Cerebral thrombosis with cerebral infarction    Unilateral weakness -rule out stroke/TIA. Multiple risks.  Neurology consulted in ER and is following. ? Lacunar stroke. -f/up neuro recs -echo pending. Can not order MRI due to ICD  -Repeat CTH is negative for acute findings. -continue eliquis, no antiplatelet. -OT/PT ordered. -neuro checks/tele  -maximize management/control of other co morbidities that increase risk of stroke.    Active Problems:   HTN (hypertension) -permissive HTN over next 24 hours. Has been reasonably well controlled. Hx of hypotension in past and medication has been titrated down on last hospitalization. -mild hypotension today -add parameters on bp meds     Chronic systolic CHF (congestive heart failure) (HCC) -no signs of clinical volume overload -appears over diuresed, will gently hydrate at 75 ml -parameters on bp meds and hold lasix in am if sbp <110 -echo pending -BNP at baseline -strict I/O and daily weights. -continue home lasix-with hold as above coreg. -no entresto/spironolactone due to CKD. -continue farxiga on d/c.      NICM (nonischemic cardiomyopathy) ([place]) -ICD. Continue tele     Type 2 diabetes mellitus with complication, with long-term current use of insulin (HCC) -recent a1c of 10.3. poor control. -continue home lantus and SSI/accuchecks ordered -discussed increases risk of stroke  with poor glycemic control. Will order diabetic education for him as well. Encourage physical exercise/weight loss. -resume farxiga on d/c      History of atrial fibrillation -NSR. Continue tele and his amiodarone/eliquis.      CKD (chronic kidney disease), stage IV (HCC) -creatinine has trended down from 3.15--->2.59 today. -was supposed to see nephrology outpatient, has not done. -needs better  control of diabetes. -continue to monitor      Hyperlipidemia Continue statin, vascepa and fenofibrate. Lipid panel pending, but 04/2020 ldl of 76     Obstructive sleep apnea -cpap at night     History of DVT (deep vein thrombosis) -unprovoked in 07/2019. Had recurrent PE in 10-/21, but had been off eliquis. -continue eliquis. Consider outpatient heme referral for hypercoag w/u.      Major depressive disorder, recurrent episode ([place]) -seen yesterday for suicide ideation, but does not endorse any si/hi/ah/vh. Has not started zoloft, so will start this inpatient today after clears swallow test.   Hyponatremia -corrected with glucose.   Anemia Around baseline Likely ACD from CKD Will check iron studies Encourage colon cancer screening outpatient if not done   Positive covid 19 -was positive for covid 6/3 during last hospitalization. Should not have been tested. 20 days out of covid and was asymptomatic at that time. -d/c'd precautions   GERD -continue pepcid and protonix.   Prolonged qt -new on ekg. ? If due to amiodarone since new drug on higher dose.   -avoid qt prolonging drugs. On tele. Repeat ekg in AM -on low dose zoloft and will need to monitor this.    DVT prophylaxis: [id number]  Code Status: full    Code Status Orders  (From admission, onward)           Start     Ordered   09/30/20 1436  Full code  Continuous        09/30/20 1437           Code Status History     Date Active Date Inactive Code Status Order ID Comments User Context   09/10/2020 1555 09/11/2020 1745 Full Code [id number]  Larey Dresser, MD Inpatient   09/10/2020 1451 09/10/2020 1555 Full Code [id number]  Larey Dresser, MD Inpatient   07/07/2020 2325 07/09/2020 1752 Full Code [id number]  Rise Patience, MD ED   02/06/2020 0306 02/08/2020 1611 Full Code [id number]  Zierle-Ghosh, Asia B, DO Inpatient   02/01/2020 0507 02/04/2020 2028 Full Code [id number]  Rise Patience, MD ED    11/14/2018 1835 11/15/2018 1821 Full Code [id number]  Elgergawy, Silver Huguenin, MD ED   10/14/2016 1421 10/15/2016 1223 Full Code [id number]  Deboraha Sprang, MD Inpatient      Family Communication: discussed using ASL today  Disposition Plan:   tbd Consults called: None Admission status: Inpatient   Consultants:  neuro  Procedures:  DG Chest 2 View  Result Date: 09/29/2020 CLINICAL DATA:  Shortness of breath. EXAM: CHEST - 2 VIEW COMPARISON:  09/11/2020 FINDINGS: The right ventricular pacer wire is stable. The CardioMEMS pulmonary artery pressure device is again noted. The heart is borderline enlarged but unchanged. The mediastinal and hilar contours are within normal limits and unchanged. No acute pulmonary findings. No infiltrates or effusions. No pneumothorax. The bony thorax is intact. IMPRESSION: No acute cardiopulmonary findings. Electronically Signed   By: Marijo Sanes M.D.   On: 09/29/2020 11:56   DG Chest 2 View  Result Date: 09/11/2020 CLINICAL DATA:  CHF EXAM: CHEST -  2 VIEW COMPARISON:  07/07/2020 FINDINGS: AICD unchanged in position. Cardiac enlargement without heart failure. No edema or effusion. Slight atelectasis in the lung bases. IMPRESSION: Mild bibasilar atelectasis.  Negative for heart failure Electronically Signed   By: Franchot Gallo M.D.   On: 09/11/2020 08:10   CT HEAD WO CONTRAST  Result Date: 10/01/2020 CLINICAL DATA:  Neuro deficit.  Acute stroke suspected.  Follow-up. EXAM: CT HEAD WITHOUT CONTRAST TECHNIQUE: Contiguous axial images were obtained from the base of the skull through the vertex without intravenous contrast. COMPARISON:  09/30/2020 FINDINGS: Brain: No evidence of acute infarction, hemorrhage, hydrocephalus, extra-axial collection or mass lesion/mass effect. Vascular: No hyperdense vessel or unexpected calcification. Skull: Normal. Negative for fracture or focal lesion. Sinuses/Orbits: No acute finding. Other: None. IMPRESSION: No acute intracranial abnormality.  Electronically Signed   By: Nolon Nations M.D.   On: 10/01/2020 13:56   CARDIAC CATHETERIZATION  Result Date: 09/10/2020 Successful Cardiomems pressure sensor placement.   CUP PACEART REMOTE DEVICE CHECK  Result Date: 09/22/2020 Scheduled remote reviewed. Normal device function.  Next remote 91 days. HB  CT HEAD CODE STROKE WO CONTRAST  Result Date: 09/30/2020 CLINICAL DATA:  Code stroke EXAM: CT HEAD WITHOUT CONTRAST CT ANGIOGRAPHY OF THE HEAD AND NECK TECHNIQUE: Contiguous axial images were obtained from the base of the skull through the vertex without intravenous contrast. Multidetector CT imaging of the head and neck was performed using the standard protocol during bolus administration of intravenous contrast. Multiplanar CT image reconstructions and MIPs were obtained to evaluate the vascular anatomy. Carotid stenosis measurements (when applicable) are obtained utilizing NASCET criteria, using the distal internal carotid diameter as the denominator. CONTRAST:  17m OMNIPAQUE IOHEXOL 350 MG/ML SOLN COMPARISON:  05/05/2020 CT head FINDINGS: CT HEAD Brain: There is no acute intracranial hemorrhage, mass effect, or edema. Gray-white differentiation is preserved. There is no extra-axial fluid collection. Ventricles and sulci are stable in size and configuration. Vascular: No hyperdense vessel. Skull: Calvarium is unremarkable. Sinuses/Orbits: No acute finding. Other: None. Review of the MIP images confirms the above findings ASPECTS ([facility] Early CT Score) - Ganglionic level infarction (caudate, lentiform nuclei, internal capsule, insula, M1-M3 cortex): 7 - Supraganglionic infarction (M4-M6 cortex): 3 Total score (0-10 with 10 being normal): 10 CTA NECK Aortic arch: Great vessel origins are patent. Right carotid system: Patent. No stenosis or evidence of dissection. Left carotid system: Patent.  No stenosis or evidence of dissection. Vertebral arteries: Patent and nearly codominant. No  stenosis or evidence of dissection. Skeleton: No significant osseous abnormality. Other neck: Unremarkable. Upper chest: Included upper lobes are clear.  Left chest wall ICD. Review of the MIP images confirms the above findings CTA HEAD Anterior circulation: Intracranial internal carotid arteries are patent. There is moderate narrowing of the left distal cavernous and paraclinoid ICA presumably secondary to circumferential noncalcified plaque. Anterior cerebral arteries are patent. Anterior communicating artery is present. Middle cerebral arteries are patent. Posterior circulation: Intracranial vertebral arteries are patent. Basilar artery is patent. Major cerebellar artery origins are patent. Posterior cerebral arteries are patent. Venous sinuses: Patent as allowed by contrast bolus timing. Review of the MIP images confirms the above findings IMPRESSION: There is no acute intracranial hemorrhage or evidence of acute infarction. ASPECT score is 10. No large vessel occlusion. Moderate narrowing of the distal left cavernous and paraclinoid ICA presumably secondary to circumferential noncalcified plaque. Initial results were communicated to Dr. KLorrin Goodellat 12:58 pm on 09/30/2020 by text page via the [facility] Hospitalmessaging system. Electronically  Signed   By: Macy Mis M.D.   On: 09/30/2020 13:16   ECHOCARDIOGRAM LIMITED  Result Date: 09/30/2020    ECHOCARDIOGRAM REPORT   Patient Name:   [name] Date of Exam: 09/30/2020 Medical Rec #:  [id number]  Height:       70.0 in Accession #:    [id number] Weight:       231.9 lb Date of Birth:  01/13/1971 BSA:          2.223 m Patient Age:    89 years   BP:           145/88 mmHg Patient Gender: M          HR:           82 bpm. Exam Location:  Inpatient Procedure: Limited Echo, Color Doppler and Cardiac Doppler Indications:    Stroke i63.9  History:        Patient has prior history of Echocardiogram examinations, most                 recent 02/06/2020. CHF, Defibrillator; Risk                  Factors:Hypertension, Diabetes, Dyslipidemia and Sleep Apnea.  Sonographer:    Raquel Sarna Senior RDCS Referring Phys: Anibal Henderson  Sonographer Comments: COVID+ at time of study IMPRESSIONS  1. Left ventricular ejection fraction, by estimation, is 35 to 40%. The left ventricle has moderately decreased function. The left ventricle demonstrates global hypokinesis. Left ventricular diastolic function could not be evaluated.  2. Right ventricular systolic function is normal. The right ventricular size is normal. There is normal pulmonary artery systolic pressure.  3. The mitral valve is normal in structure. No evidence of mitral valve regurgitation. No evidence of mitral stenosis.  4. The aortic valve is tricuspid. Aortic valve regurgitation is not visualized. No aortic stenosis is present.  5. The inferior vena cava is dilated in size with <50% respiratory variability, suggesting right atrial pressure of 15 mmHg. FINDINGS  Left Ventricle: Left ventricular ejection fraction, by estimation, is 35 to 40%. The left ventricle has moderately decreased function. The left ventricle demonstrates global hypokinesis. The left ventricular internal cavity size was normal in size. There is no left ventricular hypertrophy. Left ventricular diastolic function could not be evaluated. Right Ventricle: The right ventricular size is normal. No increase in right ventricular wall thickness. Right ventricular systolic function is normal. There is normal pulmonary artery systolic pressure. The tricuspid regurgitant velocity is 1.86 m/s, and  with an assumed right atrial pressure of 15 mmHg, the estimated right ventricular systolic pressure is 0000000 mmHg. Left Atrium: Left atrial size was normal in size. Right Atrium: Right atrial size was normal in size. Pericardium: There is no evidence of pericardial effusion. Mitral Valve: The mitral valve is normal in structure. No evidence of mitral valve regurgitation. No evidence of mitral  valve stenosis. Tricuspid Valve: The tricuspid valve is normal in structure. Tricuspid valve regurgitation is mild . No evidence of tricuspid stenosis. Aortic Valve: The aortic valve is tricuspid. Aortic valve regurgitation is not visualized. No aortic stenosis is present. Pulmonic Valve: The pulmonic valve was normal in structure. Pulmonic valve regurgitation is not visualized. No evidence of pulmonic stenosis. Aorta: The aortic root is normal in size and structure. Venous: The inferior vena cava is dilated in size with less than 50% respiratory variability, suggesting right atrial pressure of 15 mmHg. IAS/Shunts: No atrial level shunt detected by color flow Doppler.  AORTIC VALVE LVOT Vmax:   70.50 cm/s LVOT Vmean:  53.100 cm/s LVOT VTI:    0.142 m TRICUSPID VALVE TR Peak grad:   13.8 mmHg TR Vmax:        186.00 cm/s  SHUNTS Systemic VTI: 0.14 m Skeet Latch MD Electronically signed by Skeet Latch MD Signature Date/Time: 09/30/2020/5:45:29 PM    Final    CT ANGIO HEAD NECK W WO CM (CODE STROKE)  Result Date: 09/30/2020 CLINICAL DATA:  Code stroke EXAM: CT HEAD WITHOUT CONTRAST CT ANGIOGRAPHY OF THE HEAD AND NECK TECHNIQUE: Contiguous axial images were obtained from the base of the skull through the vertex without intravenous contrast. Multidetector CT imaging of the head and neck was performed using the standard protocol during bolus administration of intravenous contrast. Multiplanar CT image reconstructions and MIPs were obtained to evaluate the vascular anatomy. Carotid stenosis measurements (when applicable) are obtained utilizing NASCET criteria, using the distal internal carotid diameter as the denominator. CONTRAST:  121m OMNIPAQUE IOHEXOL 350 MG/ML SOLN COMPARISON:  05/05/2020 CT head FINDINGS: CT HEAD Brain: There is no acute intracranial hemorrhage, mass effect, or edema. Gray-white differentiation is preserved. There is no extra-axial fluid collection. Ventricles and sulci are stable in  size and configuration. Vascular: No hyperdense vessel. Skull: Calvarium is unremarkable. Sinuses/Orbits: No acute finding. Other: None. Review of the MIP images confirms the above findings ASPECTS ([facility] Early CT Score) - Ganglionic level infarction (caudate, lentiform nuclei, internal capsule, insula, M1-M3 cortex): 7 - Supraganglionic infarction (M4-M6 cortex): 3 Total score (0-10 with 10 being normal): 10 CTA NECK Aortic arch: Great vessel origins are patent. Right carotid system: Patent. No stenosis or evidence of dissection. Left carotid system: Patent.  No stenosis or evidence of dissection. Vertebral arteries: Patent and nearly codominant. No stenosis or evidence of dissection. Skeleton: No significant osseous abnormality. Other neck: Unremarkable. Upper chest: Included upper lobes are clear.  Left chest wall ICD. Review of the MIP images confirms the above findings CTA HEAD Anterior circulation: Intracranial internal carotid arteries are patent. There is moderate narrowing of the left distal cavernous and paraclinoid ICA presumably secondary to circumferential noncalcified plaque. Anterior cerebral arteries are patent. Anterior communicating artery is present. Middle cerebral arteries are patent. Posterior circulation: Intracranial vertebral arteries are patent. Basilar artery is patent. Major cerebellar artery origins are patent. Posterior cerebral arteries are patent. Venous sinuses: Patent as allowed by contrast bolus timing. Review of the MIP images confirms the above findings IMPRESSION: There is no acute intracranial hemorrhage or evidence of acute infarction. ASPECT score is 10. No large vessel occlusion. Moderate narrowing of the distal left cavernous and paraclinoid ICA presumably secondary to circumferential noncalcified plaque. Initial results were communicated to Dr. KLorrin Goodellat 12:58 pm on 09/30/2020 by text page via the [facility] Cent[facility]essaging system. Electronically Signed   By:  PMacy MisM.D.   On: 09/30/2020 13:16       Subjective: Pr with varying symptoms, did report perisstent numbnessRUE/RLE, CT without acute findings, neuro on board  Objective: Vitals:   09/30/20 2138 10/01/20 0246 10/01/20 0700 10/01/20 1212  BP: (!) 155/83 (!) 176/105 131/73 125/79  Pulse: 87 86 79 79  Resp: '18 18 16 18  '$ Temp: 98.9 F (37.2 C) 98.6 F (37 C) 98.6 F (37 C) 98.3 F (36.8 C)  TempSrc: Oral Oral Oral Oral  SpO2: 100% 99% 99% 100%  Weight:      Height:        Intake/Output Summary (Last 24 hours)  at 10/01/2020 1529 Last data filed at 09/30/2020 1659 Gross per 24 hour  Intake --  Output 700 ml  Net -700 ml   Filed Weights   09/30/20 1200 09/30/20 1301  Weight: 105.2 kg 105.2 kg    Examination:  General exam: Appears calm and comfortable, DEAF Respiratory system: Clear to auscultation. Respiratory effort normal. Cardiovascular system: S1 & S2 heard, RRR. No JVD, murmurs, rubs, gallops or clicks. No pedal edema. Gastrointestinal system: Abdomen is nondistended, soft and nontender. No organomegaly or masses felt. Normal bowel sounds heard. Central nervous system: Alert and oriented. No focal neurological deficits noted on my exam, asa bove deaf chronic Extremities: wwp, no edema Skin: No rashes, lesions or ulcers Psychiatry: Judgement and insight appear normal. Mood & affect mild labile    Data Reviewed: I have personally reviewed following labs and imaging  CBC: Recent Labs  Lab 09/30/20 1235 09/30/20 1237 10/01/20 0235  WBC  --  5.4 5.3  NEUTROABS  --  4.1 3.7  HGB 12.2* 11.1* 11.2*  HCT 36.0* 35.5* 35.2*  MCV  --  92.4 91.4  PLT  --  131* [id number]*   Basic Metabolic Panel: Recent Labs  Lab 09/29/20 1248 09/30/20 1235 09/30/20 1237 10/01/20 0235  NA 132* 130* 129* 132*  K 5.4* 4.0 4.0 3.9  CL 92* 94* 93* 92*  CO2 27  --  23 25  GLUCOSE 328* 445* 434* 285*  BUN 31* 31* 32* 32*  CREATININE 2.08* 2.60* 2.59* 2.56*  CALCIUM 9.3  --   9.1 9.4   GFR: Estimated Creatinine Clearance: 41.5 mL/min (A) (by C-G formula based on SCr of 2.56 mg/dL (H)). Liver Function Tests: Recent Labs  Lab 09/30/20 1237 10/01/20 0235  AST 44* 28  ALT 42 34  ALKPHOS 48 49  BILITOT 1.4* 1.3*  PROT 6.7 6.6  ALBUMIN 3.2* 3.1*   No results for input(s): LIPASE, AMYLASE in the last 168 hours. No results for input(s): AMMONIA in the last 168 hours. Coagulation Profile: Recent Labs  Lab 09/30/20 1237  INR 1.0   Cardiac Enzymes: No results for input(s): CKTOTAL, CKMB, CKMBINDEX, TROPONINI in the last 168 hours. BNP (last 3 results) No results for input(s): PROBNP in the last 8760 hours. HbA1C: Recent Labs    09/30/20 2204  HGBA1C 9.7*   CBG: Recent Labs  Lab 09/30/20 1654 10/01/20 0518 10/01/20 0838  GLUCAP 334* 237* 241*   Lipid Profile: Recent Labs    10/01/20 0235  CHOL 355*  HDL 46  LDLCALC UNABLE TO CALCULATE IF TRIGLYCERIDE OVER 400 mg/dL  TRIG 471*  CHOLHDL 7.7  LDLDIRECT 189.9*   Thyroid Function Tests: No results for input(s): TSH, T4TOTAL, FREET4, T3FREE, THYROIDAB in the last 72 hours. Anemia Panel: Recent Labs    09/30/20 2204  TIBC 276  IRON 24*   Sepsis Labs: No results for input(s): PROCALCITON, LATICACIDVEN in the last 168 hours.  Recent Results (from the past 240 hour(s))  Resp Panel by RT-PCR (Flu A&B, Covid) Nasopharyngeal Swab     Status: Abnormal   Collection Time: 09/30/20  1:15 PM   Specimen: Nasopharyngeal Swab; Nasopharyngeal(NP) swabs in vial transport medium  Result Value Ref Range Status   SARS Coronavirus 2 by RT PCR POSITIVE (A) NEGATIVE Final    Comment: RESULT CALLED TO, READ BACK BY AND VERIFIED WITH: Wyvonna Plum RN [id number] 09/30/20 A BROWNING (NOTE) SARS-CoV-2 target nucleic acids are DETECTED.  The SARS-CoV-2 RNA is generally detectable in upper respiratory specimens during the  acute phase of infection. Positive results are indicative of the presence of the identified virus,  but do not rule out bacterial infection or co-infection with other pathogens not detected by the test. Clinical correlation with patient history and other diagnostic information is necessary to determine patient infection status. The expected result is Negative.  Fact Sheet for Patients: EntrepreneurPulse.com.au  Fact Sheet for Healthcare Providers: IncredibleEmployment.be  This test is not yet approved or cleared by the [place] FDA and  has been authorized for detection and/or diagnosis of SARS-CoV-2 by FDA under an Emergency Use Authorization (EUA).  This EUA will remain in effect (meaning this test can  be used) for the duration of  the COVID-19 declaration under Section 564(b)(1) of the Act, 21 U.S.C. section 360bbb-3(b)(1), unless the authorization is terminated or revoked sooner.     Influenza A by PCR NEGATIVE NEGATIVE Final   Influenza B by PCR NEGATIVE NEGATIVE Final    Comment: (NOTE) The Xpert Xpress SARS-CoV-2/FLU/RSV plus assay is intended as an aid in the diagnosis of influenza from Nasopharyngeal swab specimens and should not be used as a sole basis for treatment. Nasal washings and aspirates are unacceptable for Xpert Xpress SARS-CoV-2/FLU/RSV testing.  Fact Sheet for Patients: EntrepreneurPulse.com.au  Fact Sheet for Healthcare Providers: IncredibleEmployment.be  This test is not yet approved or cleared by the [place] FDA and has been authorized for detection and/or diagnosis of SARS-CoV-2 by FDA under an Emergency Use Authorization (EUA). This EUA will remain in effect (meaning this test can be used) for the duration of the COVID-19 declaration under Section 564(b)(1) of the Act, 21 U.S.C. section 360bbb-3(b)(1), unless the authorization is terminated or revoked.  Performed at [facility], [place] [address] [place], [place] [address]          Radiology  Studies: CT HEAD WO CONTRAST  Result Date: 10/01/2020 CLINICAL DATA:  Neuro deficit.  Acute stroke suspected.  Follow-up. EXAM: CT HEAD WITHOUT CONTRAST TECHNIQUE: Contiguous axial images were obtained from the base of the skull through the vertex without intravenous contrast. COMPARISON:  09/30/2020 FINDINGS: Brain: No evidence of acute infarction, hemorrhage, hydrocephalus, extra-axial collection or mass lesion/mass effect. Vascular: No hyperdense vessel or unexpected calcification. Skull: Normal. Negative for fracture or focal lesion. Sinuses/Orbits: No acute finding. Other: None. IMPRESSION: No acute intracranial abnormality. Electronically Signed   By: Nolon Nations M.D.   On: 10/01/2020 13:56   CT HEAD CODE STROKE WO CONTRAST  Result Date: 09/30/2020 CLINICAL DATA:  Code stroke EXAM: CT HEAD WITHOUT CONTRAST CT ANGIOGRAPHY OF THE HEAD AND NECK TECHNIQUE: Contiguous axial images were obtained from the base of the skull through the vertex without intravenous contrast. Multidetector CT imaging of the head and neck was performed using the standard protocol during bolus administration of intravenous contrast. Multiplanar CT image reconstructions and MIPs were obtained to evaluate the vascular anatomy. Carotid stenosis measurements (when applicable) are obtained utilizing NASCET criteria, using the distal internal carotid diameter as the denominator. CONTRAST:  143m OMNIPAQUE IOHEXOL 350 MG/ML SOLN COMPARISON:  05/05/2020 CT head FINDINGS: CT HEAD Brain: There is no acute intracranial hemorrhage, mass effect, or edema. Gray-white differentiation is preserved. There is no extra-axial fluid collection. Ventricles and sulci are stable in size and configuration. Vascular: No hyperdense vessel. Skull: Calvarium is unremarkable. Sinuses/Orbits: No acute finding. Other: None. Review of the MIP images confirms the above findings ASPECTS ([facility]) - Ganglionic level infarction  (caudate, lentiform nuclei, internal capsule, insula,  M1-M3 cortex): 7 - Supraganglionic infarction (M4-M6 cortex): 3 Total score (0-10 with 10 being normal): 10 CTA NECK Aortic arch: Great vessel origins are patent. Right carotid system: Patent. No stenosis or evidence of dissection. Left carotid system: Patent.  No stenosis or evidence of dissection. Vertebral arteries: Patent and nearly codominant. No stenosis or evidence of dissection. Skeleton: No significant osseous abnormality. Other neck: Unremarkable. Upper chest: Included upper lobes are clear.  Left chest wall ICD. Review of the MIP images confirms the above findings CTA HEAD Anterior circulation: Intracranial internal carotid arteries are patent. There is moderate narrowing of the left distal cavernous and paraclinoid ICA presumably secondary to circumferential noncalcified plaque. Anterior cerebral arteries are patent. Anterior communicating artery is present. Middle cerebral arteries are patent. Posterior circulation: Intracranial vertebral arteries are patent. Basilar artery is patent. Major cerebellar artery origins are patent. Posterior cerebral arteries are patent. Venous sinuses: Patent as allowed by contrast bolus timing. Review of the MIP images confirms the above findings IMPRESSION: There is no acute intracranial hemorrhage or evidence of acute infarction. ASPECT score is 10. No large vessel occlusion. Moderate narrowing of the distal left cavernous and paraclinoid ICA presumably secondary to circumferential noncalcified plaque. Initial results were communicated to Dr. Lorrin Goodell at 12:58 pm on 09/30/2020 by text page via the [facility] messaging system. Electronically Signed   By: Macy Mis M.D.   On: 09/30/2020 13:16   ECHOCARDIOGRAM LIMITED  Result Date: 09/30/2020    ECHOCARDIOGRAM REPORT   Patient Name:   [name] Date of Exam: 09/30/2020 Medical Rec #:  [id number]  Height:       70.0 in Accession #:    [id number] Weight:       231.9  lb Date of Birth:  09/13/1970 BSA:          2.223 m Patient Age:    65 years   BP:           145/88 mmHg Patient Gender: M          HR:           82 bpm. Exam Location:  Inpatient Procedure: Limited Echo, Color Doppler and Cardiac Doppler Indications:    Stroke i63.9  History:        Patient has prior history of Echocardiogram examinations, most                 recent 02/06/2020. CHF, Defibrillator; Risk                 Factors:Hypertension, Diabetes, Dyslipidemia and Sleep Apnea.  Sonographer:    Raquel Sarna Senior RDCS Referring Phys: Anibal Henderson  Sonographer Comments: COVID+ at time of study IMPRESSIONS  1. Left ventricular ejection fraction, by estimation, is 35 to 40%. The left ventricle has moderately decreased function. The left ventricle demonstrates global hypokinesis. Left ventricular diastolic function could not be evaluated.  2. Right ventricular systolic function is normal. The right ventricular size is normal. There is normal pulmonary artery systolic pressure.  3. The mitral valve is normal in structure. No evidence of mitral valve regurgitation. No evidence of mitral stenosis.  4. The aortic valve is tricuspid. Aortic valve regurgitation is not visualized. No aortic stenosis is present.  5. The inferior vena cava is dilated in size with <50% respiratory variability, suggesting right atrial pressure of 15 mmHg. FINDINGS  Left Ventricle: Left ventricular ejection fraction, by estimation, is 35 to 40%. The left ventricle has moderately decreased function. The left ventricle demonstrates global hypokinesis.  The left ventricular internal cavity size was normal in size. There is no left ventricular hypertrophy. Left ventricular diastolic function could not be evaluated. Right Ventricle: The right ventricular size is normal. No increase in right ventricular wall thickness. Right ventricular systolic function is normal. There is normal pulmonary artery systolic pressure. The tricuspid regurgitant velocity is  1.86 m/s, and  with an assumed right atrial pressure of 15 mmHg, the estimated right ventricular systolic pressure is 0000000 mmHg. Left Atrium: Left atrial size was normal in size. Right Atrium: Right atrial size was normal in size. Pericardium: There is no evidence of pericardial effusion. Mitral Valve: The mitral valve is normal in structure. No evidence of mitral valve regurgitation. No evidence of mitral valve stenosis. Tricuspid Valve: The tricuspid valve is normal in structure. Tricuspid valve regurgitation is mild . No evidence of tricuspid stenosis. Aortic Valve: The aortic valve is tricuspid. Aortic valve regurgitation is not visualized. No aortic stenosis is present. Pulmonic Valve: The pulmonic valve was normal in structure. Pulmonic valve regurgitation is not visualized. No evidence of pulmonic stenosis. Aorta: The aortic root is normal in size and structure. Venous: The inferior vena cava is dilated in size with less than 50% respiratory variability, suggesting right atrial pressure of 15 mmHg. IAS/Shunts: No atrial level shunt detected by color flow Doppler.  AORTIC VALVE LVOT Vmax:   70.50 cm/s LVOT Vmean:  53.100 cm/s LVOT VTI:    0.142 m TRICUSPID VALVE TR Peak grad:   13.8 mmHg TR Vmax:        186.00 cm/s  SHUNTS Systemic VTI: 0.14 m Skeet Latch MD Electronically signed by Skeet Latch MD Signature Date/Time: 09/30/2020/5:45:29 PM    Final    CT ANGIO HEAD NECK W WO CM (CODE STROKE)  Result Date: 09/30/2020 CLINICAL DATA:  Code stroke EXAM: CT HEAD WITHOUT CONTRAST CT ANGIOGRAPHY OF THE HEAD AND NECK TECHNIQUE: Contiguous axial images were obtained from the base of the skull through the vertex without intravenous contrast. Multidetector CT imaging of the head and neck was performed using the standard protocol during bolus administration of intravenous contrast. Multiplanar CT image reconstructions and MIPs were obtained to evaluate the vascular anatomy. Carotid stenosis measurements (when  applicable) are obtained utilizing NASCET criteria, using the distal internal carotid diameter as the denominator. CONTRAST:  123m OMNIPAQUE IOHEXOL 350 MG/ML SOLN COMPARISON:  05/05/2020 CT head FINDINGS: CT HEAD Brain: There is no acute intracranial hemorrhage, mass effect, or edema. Gray-white differentiation is preserved. There is no extra-axial fluid collection. Ventricles and sulci are stable in size and configuration. Vascular: No hyperdense vessel. Skull: Calvarium is unremarkable. Sinuses/Orbits: No acute finding. Other: None. Review of the MIP images confirms the above findings ASPECTS ([facility] CT Score) - Ganglionic level infarction (caudate, lentiform nuclei, internal capsule, insula, M1-M3 cortex): 7 - Supraganglionic infarction (M4-M6 cortex): 3 Total score (0-10 with 10 being normal): 10 CTA NECK Aortic arch: Great vessel origins are patent. Right carotid system: Patent. No stenosis or evidence of dissection. Left carotid system: Patent.  No stenosis or evidence of dissection. Vertebral arteries: Patent and nearly codominant. No stenosis or evidence of dissection. Skeleton: No significant osseous abnormality. Other neck: Unremarkable. Upper chest: Included upper lobes are clear.  Left chest wall ICD. Review of the MIP images confirms the above findings CTA HEAD Anterior circulation: Intracranial internal carotid arteries are patent. There is moderate narrowing of the left distal cavernous and paraclinoid ICA presumably secondary to circumferential noncalcified plaque. Anterior cerebral arteries  are patent. Anterior communicating artery is present. Middle cerebral arteries are patent. Posterior circulation: Intracranial vertebral arteries are patent. Basilar artery is patent. Major cerebellar artery origins are patent. Posterior cerebral arteries are patent. Venous sinuses: Patent as allowed by contrast bolus timing. Review of the MIP images confirms the above findings IMPRESSION:  There is no acute intracranial hemorrhage or evidence of acute infarction. ASPECT score is 10. No large vessel occlusion. Moderate narrowing of the distal left cavernous and paraclinoid ICA presumably secondary to circumferential noncalcified plaque. Initial results were communicated to Dr. Lorrin Goodell at 12:58 pm on 09/30/2020 by text page via the [facility] messaging system. Electronically Signed   By: Macy Mis M.D.   On: 09/30/2020 13:16        Scheduled Meds:  amiodarone  200 mg Oral BID   apixaban  5 mg Oral BID   atorvastatin  80 mg Oral QHS   carvedilol  12.5 mg Oral BID   famotidine  20 mg Oral BID   fenofibrate  54 mg Oral q AM   furosemide  40 mg Oral Daily   hydrALAZINE  50 mg Oral TID   icosapent Ethyl  2 g Oral BID   insulin aspart  0-9 Units Subcutaneous TID WC   insulin glargine  24 Units Subcutaneous q AM   isosorbide mononitrate  60 mg Oral q AM   latanoprost  1 drop Both Eyes QHS   pantoprazole  40 mg Oral BID   sertraline  50 mg Oral Daily   sodium chloride flush  3 mL Intravenous Once   Continuous Infusions:   LOS: 1 day    Time spent: [place], MD [facility]  If 7PM-7AM, please contact night-coverage  10/01/2020, 3:29 PM

## 2020-10-02 ENCOUNTER — Encounter (HOSPITAL_COMMUNITY): Payer: Medicare Other

## 2020-10-02 LAB — GLUCOSE, CAPILLARY
Glucose-Capillary: 175 mg/dL — ABNORMAL HIGH (ref 70–99)
Glucose-Capillary: 248 mg/dL — ABNORMAL HIGH (ref 70–99)
Glucose-Capillary: 309 mg/dL — ABNORMAL HIGH (ref 70–99)
Glucose-Capillary: 264 mg/dL — ABNORMAL HIGH (ref 70–99)

## 2020-10-02 NOTE — Progress Notes (Signed)
Inpatient Diabetes Program Recommendations  AACE/ADA: New Consensus Statement on Inpatient Glycemic Control   Target Ranges:  Prepandial:   less than 140 mg/dL      Peak postprandial:   less than 180 mg/dL (1-2 hours)      Critically ill patients:  140 - 180 mg/dL   Results for GASPER, ISHIKAWA (MRN [id number]) as of 10/02/2020 11:16  Ref. Range 10/01/2020 05:18 10/01/2020 08:38 10/01/2020 15:43 10/01/2020 21:28 10/02/2020 06:39  Glucose-Capillary Latest Ref Range: 70 - 99 mg/dL 237 (H) 241 (H) 378 (H) 190 (H) 175 (H)    Review of Glycemic Control  Diabetes history: DM2 Outpatient Diabetes medications: Lantus 24 units QAM, Farxiga 10 mg daily Current orders for Inpatient glycemic control: Lantus 24 units QAM, Novolog 0-9 units TID with meals  Inpatient Diabetes Program Recommendations:    Insulin: Please consider ordering Novolog 4 units TID with meals for meal coverage if patient eats at least 50% of meals.  Thanks, Barnie Alderman, RN, MSN, CDE Diabetes Coordinator Inpatient Diabetes Program [phone] (Team Pager from 8am to 5pm)

## 2020-10-02 NOTE — Progress Notes (Signed)
PROGRESS NOTE    Troy Rush  [id number] DOB: 01-09-1971 DOA: 09/30/2020 PCP: Merryl Hacker, No   Brief Narrative:  Troy Rush is a 50 y.o. male with medical history significant of hyperlipidemia, systolic CHF, HTN, atrial fibrillation, nonischemic cardiomyopathy with ICD, DM type2, deafness, hx of DVT/PE on eliquis, depression, CKD stage 4, OSA who presented to ER with concerns for a stroke.   He states he woke up and felt normal, but around 10:00Am he had dizziness and felt numb on the right side of his body. He wasn't able to walk due to the dizziness. He felt like his right leg felt frozen and he couldn't use it. He called his nurse and told him to call 911. He called EMS and stroke code was initiated. He states the right side of his body still feels weak and numb, but it has improved some. He can moved his right hand, but it's still hard to use his hand. He denies any drooping face. Has some tingling in his lips and his right side of his face almost has some pain like sensation.  He denies any headache, vision changes. He is not dizzy, but had an episode of dizziness earlier today. No chest pain, shortness of breath, palpitations. He has missed 3 days of his medication due to vomiting. He started taking medication today. No hx of stroke in the past. He is right handed.  He denies any fever/chills, coughing, leg swelling.    Recently seen at [facility] yesterday for SI and covid + and was started on zoloft; however, he has not started this. He denies any SI/HI/AH/VH. Denies any cough, fever, sneezing. Tested positive for covid at beginning of June   Assessment & Plan:   Principal Problem:   Unilateral weakness Active Problems:   Major depressive disorder, recurrent episode (HCC)   Chronic systolic CHF (congestive heart failure) (HCC)   HTN (hypertension)   NICM (nonischemic cardiomyopathy) ([place])   Type 2 diabetes mellitus with complication, with long-term current use of insulin (HCC)   CKD (chronic  kidney disease), stage IV (HCC)   Hyperlipidemia   Obstructive sleep apnea   History of atrial fibrillation   History of DVT (deep vein thrombosis)   Cerebral thrombosis with cerebral infarction    Unilateral weakness with probable subcortical CVA -rule out stroke/TIA. Multiple risks.  Neurology consulted in ER and is following. For possible small subcortical stroke. -f/up neuro recs -echo reviewed mildly depressed Ef-no acute findings to explain sx - Can not order MRI due to ICD  -Repeat CTH is negative for acute findings. -continue eliquis, no antiplatelet. -OT/PT recs CIR, consult placed -neuro checks/tele  -maximize management/control of other co morbidities that increase risk of stroke.    Active Problems:   HTN (hypertension) -permissive HTN over next 24 hours. Has been reasonably well controlled. Hx of hypotension in past and medication has been titrated down on last hospitalization. -add parameters on bp meds -discontinued gentle hydration     Chronic systolic CHF (congestive heart failure) ([place]) -no signs of clinical volume overload -appears over diuresed, will gently hydrate at 75 ml-now d/c'd 6/24 -parameters on bp meds and hold lasix in am if sbp <110 -echo reviewed -BNP at baseline -strict I/O and daily weights. -continue home lasix-with hold as above coreg. -no entresto/spironolactone due to CKD. -continue farxiga on d/c.      NICM (nonischemic cardiomyopathy) ([place]) -ICD. Continue tele     Type 2 diabetes mellitus with complication, with long-term current use  of insulin ([place]) -recent a1c of 10.3. poor control. -continue home lantus and SSI/accuchecks ordered -discussed increases risk of stroke with poor glycemic control. Will order diabetic education for him as well. Encourage physical exercise/weight loss. -resume farxiga on d/c      History of atrial fibrillation -NSR. Continue tele and his amiodarone/eliquis.      CKD (chronic kidney disease), stage IV  (HCC) -creatinine has trended down from 3.15--->2.59 today. -was supposed to see nephrology outpatient, has not done. -needs better control of diabetes. -continue to monitor      Hyperlipidemia Continue statin, vascepa and fenofibrate. Lipid panel show poor contorl, suspect non-adherence     Obstructive sleep apnea - cpap at night     History of DVT (deep vein thrombosis) -unprovoked in 07/2019. Had recurrent PE in 10-/21, but had been off eliquis. -continue eliquis. Consider outpatient heme referral for hypercoag w/u.      Major depressive disorder, recurrent episode ([place]) -seen yesterday for suicide ideation, but does not endorse any si/hi/ah/vh. -no evid of SI/HI -c/w zoloft   Hyponatremia -corrected with glucose.   Anemia Around baseline Likely ACD from CKD Will check iron studies Encourage colon cancer screening outpatient if not done   Positive covid 19 -was positive for covid 6/3 during last hospitalization. Should not have been tested. 20 days out of covid and was asymptomatic at that time. -d/c'd precautions   GERD -continue pepcid and protonix.   Prolonged qt -new on ekg. ? If due to amiodarone since new drug on higher dose.   -avoid qt prolonging drugs. On tele. Repeat ekg in AM -on low dose zoloft and will need to monitor this.    DVT prophylaxis: [id number]  Code Status: full    Code Status Orders  (From admission, onward)           Start     Ordered   09/30/20 1436  Full code  Continuous        09/30/20 1437           Code Status History     Date Active Date Inactive Code Status Order ID Comments User Context   09/10/2020 1555 09/11/2020 1745 Full Code [id number]  Larey Dresser, MD Inpatient   09/10/2020 1451 09/10/2020 1555 Full Code [id number]  Larey Dresser, MD Inpatient   07/07/2020 2325 07/09/2020 1752 Full Code [id number]  Rise Patience, MD ED   02/06/2020 0306 02/08/2020 1611 Full Code [id number]  Zierle-Ghosh, Asia B, DO  Inpatient   02/01/2020 0507 02/04/2020 2028 Full Code [id number]  Rise Patience, MD ED   11/14/2018 1835 11/15/2018 1821 Full Code [id number]  Elgergawy, Silver Huguenin, MD ED   10/14/2016 1421 10/15/2016 1223 Full Code [id number]  Deboraha Sprang, MD Inpatient      Family Communication: discussed with pt in writing today Disposition Plan:   Therapy recs for CIR, eval pending Consults called:  CIR Admission status: Inpatient No safe discharge plan, pt will require intensive inpt therapy  Consultants:  NEURO  Procedures:  DG Chest 2 View  Result Date: 09/29/2020 CLINICAL DATA:  Shortness of breath. EXAM: CHEST - 2 VIEW COMPARISON:  09/11/2020 FINDINGS: The right ventricular pacer wire is stable. The CardioMEMS pulmonary artery pressure device is again noted. The heart is borderline enlarged but unchanged. The mediastinal and hilar contours are within normal limits and unchanged. No acute pulmonary findings. No infiltrates or effusions. No pneumothorax. The bony thorax is intact. IMPRESSION: No acute cardiopulmonary findings. Electronically Signed  By: Marijo Sanes M.D.   On: 09/29/2020 11:56   DG Chest 2 View  Result Date: 09/11/2020 CLINICAL DATA:  CHF EXAM: CHEST - 2 VIEW COMPARISON:  07/07/2020 FINDINGS: AICD unchanged in position. Cardiac enlargement without heart failure. No edema or effusion. Slight atelectasis in the lung bases. IMPRESSION: Mild bibasilar atelectasis.  Negative for heart failure Electronically Signed   By: Franchot Gallo M.D.   On: 09/11/2020 08:10   CT HEAD WO CONTRAST  Result Date: 10/01/2020 CLINICAL DATA:  Neuro deficit.  Acute stroke suspected.  Follow-up. EXAM: CT HEAD WITHOUT CONTRAST TECHNIQUE: Contiguous axial images were obtained from the base of the skull through the vertex without intravenous contrast. COMPARISON:  09/30/2020 FINDINGS: Brain: No evidence of acute infarction, hemorrhage, hydrocephalus, extra-axial collection or mass lesion/mass effect. Vascular:  No hyperdense vessel or unexpected calcification. Skull: Normal. Negative for fracture or focal lesion. Sinuses/Orbits: No acute finding. Other: None. IMPRESSION: No acute intracranial abnormality. Electronically Signed   By: Nolon Nations M.D.   On: 10/01/2020 13:56   CARDIAC CATHETERIZATION  Result Date: 09/10/2020 Successful Cardiomems pressure sensor placement.   CUP PACEART REMOTE DEVICE CHECK  Result Date: 09/22/2020 Scheduled remote reviewed. Normal device function.  Next remote 91 days. HB  CT HEAD CODE STROKE WO CONTRAST  Result Date: 09/30/2020 CLINICAL DATA:  Code stroke EXAM: CT HEAD WITHOUT CONTRAST CT ANGIOGRAPHY OF THE HEAD AND NECK TECHNIQUE: Contiguous axial images were obtained from the base of the skull through the vertex without intravenous contrast. Multidetector CT imaging of the head and neck was performed using the standard protocol during bolus administration of intravenous contrast. Multiplanar CT image reconstructions and MIPs were obtained to evaluate the vascular anatomy. Carotid stenosis measurements (when applicable) are obtained utilizing NASCET criteria, using the distal internal carotid diameter as the denominator. CONTRAST:  161m OMNIPAQUE IOHEXOL 350 MG/ML SOLN COMPARISON:  05/05/2020 CT head FINDINGS: CT HEAD Brain: There is no acute intracranial hemorrhage, mass effect, or edema. Gray-white differentiation is preserved. There is no extra-axial fluid collection. Ventricles and sulci are stable in size and configuration. Vascular: No hyperdense vessel. Skull: Calvarium is unremarkable. Sinuses/Orbits: No acute finding. Other: None. Review of the MIP images confirms the above findings ASPECTS ([facility] Early CT Score) - Ganglionic level infarction (caudate, lentiform nuclei, internal capsule, insula, M1-M3 cortex): 7 - Supraganglionic infarction (M4-M6 cortex): 3 Total score (0-10 with 10 being normal): 10 CTA NECK Aortic arch: Great vessel origins are  patent. Right carotid system: Patent. No stenosis or evidence of dissection. Left carotid system: Patent.  No stenosis or evidence of dissection. Vertebral arteries: Patent and nearly codominant. No stenosis or evidence of dissection. Skeleton: No significant osseous abnormality. Other neck: Unremarkable. Upper chest: Included upper lobes are clear.  Left chest wall ICD. Review of the MIP images confirms the above findings CTA HEAD Anterior circulation: Intracranial internal carotid arteries are patent. There is moderate narrowing of the left distal cavernous and paraclinoid ICA presumably secondary to circumferential noncalcified plaque. Anterior cerebral arteries are patent. Anterior communicating artery is present. Middle cerebral arteries are patent. Posterior circulation: Intracranial vertebral arteries are patent. Basilar artery is patent. Major cerebellar artery origins are patent. Posterior cerebral arteries are patent. Venous sinuses: Patent as allowed by contrast bolus timing. Review of the MIP images confirms the above findings IMPRESSION: There is no acute intracranial hemorrhage or evidence of acute infarction. ASPECT score is 10. No large vessel occlusion. Moderate narrowing of the distal left cavernous and paraclinoid ICA  presumably secondary to circumferential noncalcified plaque. Initial results were communicated to Dr. Lorrin Goodell at 12:58 pm on 09/30/2020 by text page via the [facility] messaging system. Electronically Signed   By: Macy Mis M.D.   On: 09/30/2020 13:16   ECHOCARDIOGRAM LIMITED  Result Date: 09/30/2020    ECHOCARDIOGRAM REPORT   Patient Name:   [name] Date of Exam: 09/30/2020 Medical Rec #:  [id number]  Height:       70.0 in Accession #:    [id number] Weight:       231.9 lb Date of Birth:  1971/02/09 BSA:          2.223 m Patient Age:    67 years   BP:           145/88 mmHg Patient Gender: M          HR:           82 bpm. Exam Location:  Inpatient Procedure: Limited Echo,  Color Doppler and Cardiac Doppler Indications:    Stroke i63.9  History:        Patient has prior history of Echocardiogram examinations, most                 recent 02/06/2020. CHF, Defibrillator; Risk                 Factors:Hypertension, Diabetes, Dyslipidemia and Sleep Apnea.  Sonographer:    Raquel Sarna Senior RDCS Referring Phys: Anibal Henderson  Sonographer Comments: COVID+ at time of study IMPRESSIONS  1. Left ventricular ejection fraction, by estimation, is 35 to 40%. The left ventricle has moderately decreased function. The left ventricle demonstrates global hypokinesis. Left ventricular diastolic function could not be evaluated.  2. Right ventricular systolic function is normal. The right ventricular size is normal. There is normal pulmonary artery systolic pressure.  3. The mitral valve is normal in structure. No evidence of mitral valve regurgitation. No evidence of mitral stenosis.  4. The aortic valve is tricuspid. Aortic valve regurgitation is not visualized. No aortic stenosis is present.  5. The inferior vena cava is dilated in size with <50% respiratory variability, suggesting right atrial pressure of 15 mmHg. FINDINGS  Left Ventricle: Left ventricular ejection fraction, by estimation, is 35 to 40%. The left ventricle has moderately decreased function. The left ventricle demonstrates global hypokinesis. The left ventricular internal cavity size was normal in size. There is no left ventricular hypertrophy. Left ventricular diastolic function could not be evaluated. Right Ventricle: The right ventricular size is normal. No increase in right ventricular wall thickness. Right ventricular systolic function is normal. There is normal pulmonary artery systolic pressure. The tricuspid regurgitant velocity is 1.86 m/s, and  with an assumed right atrial pressure of 15 mmHg, the estimated right ventricular systolic pressure is 0000000 mmHg. Left Atrium: Left atrial size was normal in size. Right Atrium: Right atrial  size was normal in size. Pericardium: There is no evidence of pericardial effusion. Mitral Valve: The mitral valve is normal in structure. No evidence of mitral valve regurgitation. No evidence of mitral valve stenosis. Tricuspid Valve: The tricuspid valve is normal in structure. Tricuspid valve regurgitation is mild . No evidence of tricuspid stenosis. Aortic Valve: The aortic valve is tricuspid. Aortic valve regurgitation is not visualized. No aortic stenosis is present. Pulmonic Valve: The pulmonic valve was normal in structure. Pulmonic valve regurgitation is not visualized. No evidence of pulmonic stenosis. Aorta: The aortic root is normal in size and structure. Venous: The inferior vena cava is  dilated in size with less than 50% respiratory variability, suggesting right atrial pressure of 15 mmHg. IAS/Shunts: No atrial level shunt detected by color flow Doppler.  AORTIC VALVE LVOT Vmax:   70.50 cm/s LVOT Vmean:  53.100 cm/s LVOT VTI:    0.142 m TRICUSPID VALVE TR Peak grad:   13.8 mmHg TR Vmax:        186.00 cm/s  SHUNTS Systemic VTI: 0.14 m Skeet Latch MD Electronically signed by Skeet Latch MD Signature Date/Time: 09/30/2020/5:45:29 PM    Final    CT ANGIO HEAD NECK W WO CM (CODE STROKE)  Result Date: 09/30/2020 CLINICAL DATA:  Code stroke EXAM: CT HEAD WITHOUT CONTRAST CT ANGIOGRAPHY OF THE HEAD AND NECK TECHNIQUE: Contiguous axial images were obtained from the base of the skull through the vertex without intravenous contrast. Multidetector CT imaging of the head and neck was performed using the standard protocol during bolus administration of intravenous contrast. Multiplanar CT image reconstructions and MIPs were obtained to evaluate the vascular anatomy. Carotid stenosis measurements (when applicable) are obtained utilizing NASCET criteria, using the distal internal carotid diameter as the denominator. CONTRAST:  165m OMNIPAQUE IOHEXOL 350 MG/ML SOLN COMPARISON:  05/05/2020 CT head  FINDINGS: CT HEAD Brain: There is no acute intracranial hemorrhage, mass effect, or edema. Gray-white differentiation is preserved. There is no extra-axial fluid collection. Ventricles and sulci are stable in size and configuration. Vascular: No hyperdense vessel. Skull: Calvarium is unremarkable. Sinuses/Orbits: No acute finding. Other: None. Review of the MIP images confirms the above findings ASPECTS ([facility] Program Early CT Score) - Ganglionic level infarction (caudate, lentiform nuclei, internal capsule, insula, M1-M3 cortex): 7 - Supraganglionic infarction (M4-M6 cortex): 3 Total score (0-10 with 10 being normal): 10 CTA NECK Aortic arch: Great vessel origins are patent. Right carotid system: Patent. No stenosis or evidence of dissection. Left carotid system: Patent.  No stenosis or evidence of dissection. Vertebral arteries: Patent and nearly codominant. No stenosis or evidence of dissection. Skeleton: No significant osseous abnormality. Other neck: Unremarkable. Upper chest: Included upper lobes are clear.  Left chest wall ICD. Review of the MIP images confirms the above findings CTA HEAD Anterior circulation: Intracranial internal carotid arteries are patent. There is moderate narrowing of the left distal cavernous and paraclinoid ICA presumably secondary to circumferential noncalcified plaque. Anterior cerebral arteries are patent. Anterior communicating artery is present. Middle cerebral arteries are patent. Posterior circulation: Intracranial vertebral arteries are patent. Basilar artery is patent. Major cerebellar artery origins are patent. Posterior cerebral arteries are patent. Venous sinuses: Patent as allowed by contrast bolus timing. Review of the MIP images confirms the above findings IMPRESSION: There is no acute intracranial hemorrhage or evidence of acute infarction. ASPECT score is 10. No large vessel occlusion. Moderate narrowing of the distal left cavernous and paraclinoid ICA  presumably secondary to circumferential noncalcified plaque. Initial results were communicated to Dr. KLorrin Goodellat 12:58 pm on 09/30/2020 by text page via the [facility] system. Electronically Signed   By: PMacy MisM.D.   On: 09/30/2020 13:16        Subjective: APPEARS TO BE IN MUCH BETTER SPIRITS TODAY, NO COMPLAINTS OVERNIGHT RESTING IN BEDSIDE CHAIR NO NEURO COMPLAINTS THIS AM  Objective: Vitals:   10/02/20 0400 10/02/20 0500 10/02/20 0733 10/02/20 1208  BP: 118/77  96/66 118/67  Pulse: 80  85 81  Resp: '16  18 18  '$ Temp: 98.6 F (37 C)  98.3 F (36.8 C) 97.9 F (36.6 C)  TempSrc: Oral  Oral Oral  SpO2: 99%  99% 100%  Weight:  102.9 kg    Height:        Intake/Output Summary (Last 24 hours) at 10/02/2020 1256 Last data filed at 10/02/2020 0800 Gross per 24 hour  Intake 240 ml  Output 450 ml  Net -210 ml   Filed Weights   09/30/20 1200 09/30/20 1301 10/02/20 0500  Weight: 105.2 kg 105.2 kg 102.9 kg    Examination:  General exam: Appears calm and comfortable, DEAF Respiratory system: Clear to auscultation. Respiratory effort normal. Cardiovascular system: S1 & S2 heard, RRR. No JVD, murmurs, rubs, gallops or clicks. No pedal edema. Gastrointestinal system: Abdomen is nondistended, soft and nontender. No organomegaly or masses felt. Normal bowel sounds heard. Central nervous system: Alert and oriented. Still with no focal neurological deficits noted on my exam, as above deaf chronic Extremities: wwp, no edema Skin: No rashes, lesions or ulcers Psychiatry: Judgement and insight appear normal. Mood & affect much more appropriate today    Data Reviewed: I have personally reviewed following labs and imaging studies  CBC: Recent Labs  Lab 09/30/20 1235 09/30/20 1237 10/01/20 0235  WBC  --  5.4 5.3  NEUTROABS  --  4.1 3.7  HGB 12.2* 11.1* 11.2*  HCT 36.0* 35.5* 35.2*  MCV  --  92.4 91.4  PLT  --  131* [id number]*   Basic Metabolic Panel: Recent Labs  Lab  09/29/20 1248 09/30/20 1235 09/30/20 1237 10/01/20 0235  NA 132* 130* 129* 132*  K 5.4* 4.0 4.0 3.9  CL 92* 94* 93* 92*  CO2 27  --  23 25  GLUCOSE 328* 445* 434* 285*  BUN 31* 31* 32* 32*  CREATININE 2.08* 2.60* 2.59* 2.56*  CALCIUM 9.3  --  9.1 9.4   GFR: Estimated Creatinine Clearance: 41 mL/min (A) (by C-G formula based on SCr of 2.56 mg/dL (H)). Liver Function Tests: Recent Labs  Lab 09/30/20 1237 10/01/20 0235  AST 44* 28  ALT 42 34  ALKPHOS 48 49  BILITOT 1.4* 1.3*  PROT 6.7 6.6  ALBUMIN 3.2* 3.1*   No results for input(s): LIPASE, AMYLASE in the last 168 hours. No results for input(s): AMMONIA in the last 168 hours. Coagulation Profile: Recent Labs  Lab 09/30/20 1237  INR 1.0   Cardiac Enzymes: No results for input(s): CKTOTAL, CKMB, CKMBINDEX, TROPONINI in the last 168 hours. BNP (last 3 results) No results for input(s): PROBNP in the last 8760 hours. HbA1C: Recent Labs    09/30/20 2204  HGBA1C 9.7*   CBG: Recent Labs  Lab 10/01/20 0518 10/01/20 0838 10/01/20 1543 10/01/20 2128 10/02/20 0639  GLUCAP 237* 241* 378* 190* 175*   Lipid Profile: Recent Labs    10/01/20 0235  CHOL 355*  HDL 46  LDLCALC UNABLE TO CALCULATE IF TRIGLYCERIDE OVER 400 mg/dL  TRIG 471*  CHOLHDL 7.7  LDLDIRECT 189.9*   Thyroid Function Tests: No results for input(s): TSH, T4TOTAL, FREET4, T3FREE, THYROIDAB in the last 72 hours. Anemia Panel: Recent Labs    09/30/20 2204  TIBC 276  IRON 24*   Sepsis Labs: No results for input(s): PROCALCITON, LATICACIDVEN in the last 168 hours.  Recent Results (from the past 240 hour(s))  Resp Panel by RT-PCR (Flu A&B, Covid) Nasopharyngeal Swab     Status: Abnormal   Collection Time: 09/30/20  1:15 PM   Specimen: Nasopharyngeal Swab; Nasopharyngeal(NP) swabs in vial transport medium  Result Value Ref Range Status   SARS Coronavirus 2 by RT  PCR POSITIVE (A) NEGATIVE Final    Comment: RESULT CALLED TO, READ BACK BY AND  VERIFIED WITH: Wyvonna Plum RN [id number] 09/30/20 A BROWNING (NOTE) SARS-CoV-2 target nucleic acids are DETECTED.  The SARS-CoV-2 RNA is generally detectable in upper respiratory specimens during the acute phase of infection. Positive results are indicative of the presence of the identified virus, but do not rule out bacterial infection or co-infection with other pathogens not detected by the test. Clinical correlation with patient history and other diagnostic information is necessary to determine patient infection status. The expected result is Negative.  Fact Sheet for Patients: EntrepreneurPulse.com.au  Fact Sheet for Healthcare Providers: IncredibleEmployment.be  This test is not yet approved or cleared by the [place] FDA and  has been authorized for detection and/or diagnosis of SARS-CoV-2 by FDA under an Emergency Use Authorization (EUA).  This EUA will remain in effect (meaning this test can  be used) for the duration of  the COVID-19 declaration under Section 564(b)(1) of the Act, 21 U.S.C. section 360bbb-3(b)(1), unless the authorization is terminated or revoked sooner.     Influenza A by PCR NEGATIVE NEGATIVE Final   Influenza B by PCR NEGATIVE NEGATIVE Final    Comment: (NOTE) The Xpert Xpress SARS-CoV-2/FLU/RSV plus assay is intended as an aid in the diagnosis of influenza from Nasopharyngeal swab specimens and should not be used as a sole basis for treatment. Nasal washings and aspirates are unacceptable for Xpert Xpress SARS-CoV-2/FLU/RSV testing.  Fact Sheet for Patients: EntrepreneurPulse.com.au  Fact Sheet for Healthcare Providers: IncredibleEmployment.be  This test is not yet approved or cleared by the [place] FDA and has been authorized for detection and/or diagnosis of SARS-CoV-2 by FDA under an Emergency Use Authorization (EUA). This EUA will remain in effect (meaning this test  can be used) for the duration of the COVID-19 declaration under Section 564(b)(1) of the Act, 21 U.S.C. section 360bbb-3(b)(1), unless the authorization is terminated or revoked.  Performed at [facility], [place] [address] [place], [place] [address]          Radiology Studies: CT HEAD WO CONTRAST  Result Date: 10/01/2020 CLINICAL DATA:  Neuro deficit.  Acute stroke suspected.  Follow-up. EXAM: CT HEAD WITHOUT CONTRAST TECHNIQUE: Contiguous axial images were obtained from the base of the skull through the vertex without intravenous contrast. COMPARISON:  09/30/2020 FINDINGS: Brain: No evidence of acute infarction, hemorrhage, hydrocephalus, extra-axial collection or mass lesion/mass effect. Vascular: No hyperdense vessel or unexpected calcification. Skull: Normal. Negative for fracture or focal lesion. Sinuses/Orbits: No acute finding. Other: None. IMPRESSION: No acute intracranial abnormality. Electronically Signed   By: Nolon Nations M.D.   On: 10/01/2020 13:56   CT HEAD CODE STROKE WO CONTRAST  Result Date: 09/30/2020 CLINICAL DATA:  Code stroke EXAM: CT HEAD WITHOUT CONTRAST CT ANGIOGRAPHY OF THE HEAD AND NECK TECHNIQUE: Contiguous axial images were obtained from the base of the skull through the vertex without intravenous contrast. Multidetector CT imaging of the head and neck was performed using the standard protocol during bolus administration of intravenous contrast. Multiplanar CT image reconstructions and MIPs were obtained to evaluate the vascular anatomy. Carotid stenosis measurements (when applicable) are obtained utilizing NASCET criteria, using the distal internal carotid diameter as the denominator. CONTRAST:  189m OMNIPAQUE IOHEXOL 350 MG/ML SOLN COMPARISON:  05/05/2020 CT head FINDINGS: CT HEAD Brain: There is no acute intracranial hemorrhage, mass effect, or edema. Gray-white differentiation is preserved. There is no extra-axial fluid collection. Ventricles and sulci  are  stable in size and configuration. Vascular: No hyperdense vessel. Skull: Calvarium is unremarkable. Sinuses/Orbits: No acute finding. Other: None. Review of the MIP images confirms the above findings ASPECTS [facility] Stroke Program Early CT Score) - Ganglionic level infarction (caudate, lentiform nuclei, internal capsule, insula, M1-M3 cortex): 7 - Supraganglionic infarction (M4-M6 cortex): 3 Total score (0-10 with 10 being normal): 10 CTA NECK Aortic arch: Great vessel origins are patent. Right carotid system: Patent. No stenosis or evidence of dissection. Left carotid system: Patent.  No stenosis or evidence of dissection. Vertebral arteries: Patent and nearly codominant. No stenosis or evidence of dissection. Skeleton: No significant osseous abnormality. Other neck: Unremarkable. Upper chest: Included upper lobes are clear.  Left chest wall ICD. Review of the MIP images confirms the above findings CTA HEAD Anterior circulation: Intracranial internal carotid arteries are patent. There is moderate narrowing of the left distal cavernous and paraclinoid ICA presumably secondary to circumferential noncalcified plaque. Anterior cerebral arteries are patent. Anterior communicating artery is present. Middle cerebral arteries are patent. Posterior circulation: Intracranial vertebral arteries are patent. Basilar artery is patent. Major cerebellar artery origins are patent. Posterior cerebral arteries are patent. Venous sinuses: Patent as allowed by contrast bolus timing. Review of the MIP images confirms the above findings IMPRESSION: There is no acute intracranial hemorrhage or evidence of acute infarction. ASPECT score is 10. No large vessel occlusion. Moderate narrowing of the distal left cavernous and paraclinoid ICA presumably secondary to circumferential noncalcified plaque. Initial results were communicated to Dr. Lorrin Goodell at 12:58 pm on 09/30/2020 by text page via the [facility] messaging system. Electronically  Signed   By: Macy Mis M.D.   On: 09/30/2020 13:16   ECHOCARDIOGRAM LIMITED  Result Date: 09/30/2020    ECHOCARDIOGRAM REPORT   Patient Name:   [name] Date of Exam: 09/30/2020 Medical Rec #:  [id number]  Height:       70.0 in Accession #:    [id number] Weight:       231.9 lb Date of Birth:  Apr 26, 1970 BSA:          2.223 m Patient Age:    44 years   BP:           145/88 mmHg Patient Gender: M          HR:           82 bpm. Exam Location:  Inpatient Procedure: Limited Echo, Color Doppler and Cardiac Doppler Indications:    Stroke i63.9  History:        Patient has prior history of Echocardiogram examinations, most                 recent 02/06/2020. CHF, Defibrillator; Risk                 Factors:Hypertension, Diabetes, Dyslipidemia and Sleep Apnea.  Sonographer:    Raquel Sarna Senior RDCS Referring Phys: Anibal Henderson  Sonographer Comments: COVID+ at time of study IMPRESSIONS  1. Left ventricular ejection fraction, by estimation, is 35 to 40%. The left ventricle has moderately decreased function. The left ventricle demonstrates global hypokinesis. Left ventricular diastolic function could not be evaluated.  2. Right ventricular systolic function is normal. The right ventricular size is normal. There is normal pulmonary artery systolic pressure.  3. The mitral valve is normal in structure. No evidence of mitral valve regurgitation. No evidence of mitral stenosis.  4. The aortic valve is tricuspid. Aortic valve regurgitation is not visualized. No aortic stenosis is present.  5. The  inferior vena cava is dilated in size with <50% respiratory variability, suggesting right atrial pressure of 15 mmHg. FINDINGS  Left Ventricle: Left ventricular ejection fraction, by estimation, is 35 to 40%. The left ventricle has moderately decreased function. The left ventricle demonstrates global hypokinesis. The left ventricular internal cavity size was normal in size. There is no left ventricular hypertrophy. Left ventricular  diastolic function could not be evaluated. Right Ventricle: The right ventricular size is normal. No increase in right ventricular wall thickness. Right ventricular systolic function is normal. There is normal pulmonary artery systolic pressure. The tricuspid regurgitant velocity is 1.86 m/s, and  with an assumed right atrial pressure of 15 mmHg, the estimated right ventricular systolic pressure is 0000000 mmHg. Left Atrium: Left atrial size was normal in size. Right Atrium: Right atrial size was normal in size. Pericardium: There is no evidence of pericardial effusion. Mitral Valve: The mitral valve is normal in structure. No evidence of mitral valve regurgitation. No evidence of mitral valve stenosis. Tricuspid Valve: The tricuspid valve is normal in structure. Tricuspid valve regurgitation is mild . No evidence of tricuspid stenosis. Aortic Valve: The aortic valve is tricuspid. Aortic valve regurgitation is not visualized. No aortic stenosis is present. Pulmonic Valve: The pulmonic valve was normal in structure. Pulmonic valve regurgitation is not visualized. No evidence of pulmonic stenosis. Aorta: The aortic root is normal in size and structure. Venous: The inferior vena cava is dilated in size with less than 50% respiratory variability, suggesting right atrial pressure of 15 mmHg. IAS/Shunts: No atrial level shunt detected by color flow Doppler.  AORTIC VALVE LVOT Vmax:   70.50 cm/s LVOT Vmean:  53.100 cm/s LVOT VTI:    0.142 m TRICUSPID VALVE TR Peak grad:   13.8 mmHg TR Vmax:        186.00 cm/s  SHUNTS Systemic VTI: 0.14 m Skeet Latch MD Electronically signed by Skeet Latch MD Signature Date/Time: 09/30/2020/5:45:29 PM    Final    CT ANGIO HEAD NECK W WO CM (CODE STROKE)  Result Date: 09/30/2020 CLINICAL DATA:  Code stroke EXAM: CT HEAD WITHOUT CONTRAST CT ANGIOGRAPHY OF THE HEAD AND NECK TECHNIQUE: Contiguous axial images were obtained from the base of the skull through the vertex without  intravenous contrast. Multidetector CT imaging of the head and neck was performed using the standard protocol during bolus administration of intravenous contrast. Multiplanar CT image reconstructions and MIPs were obtained to evaluate the vascular anatomy. Carotid stenosis measurements (when applicable) are obtained utilizing NASCET criteria, using the distal internal carotid diameter as the denominator. CONTRAST:  14m OMNIPAQUE IOHEXOL 350 MG/ML SOLN COMPARISON:  05/05/2020 CT head FINDINGS: CT HEAD Brain: There is no acute intracranial hemorrhage, mass effect, or edema. Gray-white differentiation is preserved. There is no extra-axial fluid collection. Ventricles and sulci are stable in size and configuration. Vascular: No hyperdense vessel. Skull: Calvarium is unremarkable. Sinuses/Orbits: No acute finding. Other: None. Review of the MIP images confirms the above findings AS[facility]S [facility]) - Ganglionic level infarction (caudate, lentiform nuclei, internal capsule, insula, M1-M3 cortex): 7 - Supraganglionic infarction (M4-M6 cortex): 3 Total score (0-10 with 10 being normal): 10 CTA NECK Aortic arch: Great vessel origins are patent. Right carotid system: Patent. No stenosis or evidence of dissection. Left carotid system: Patent.  No stenosis or evidence of dissection. Vertebral arteries: Patent and nearly codominant. No stenosis or evidence of dissection. Skeleton: No significant osseous abnormality. Other neck: Unremarkable. Upper chest: Included upper lobes are clear.  Left chest wall ICD. Review of the MIP images confirms the above findings CTA HEAD Anterior circulation: Intracranial internal carotid arteries are patent. There is moderate narrowing of the left distal cavernous and paraclinoid ICA presumably secondary to circumferential noncalcified plaque. Anterior cerebral arteries are patent. Anterior communicating artery is present. Middle cerebral arteries are patent.  Posterior circulation: Intracranial vertebral arteries are patent. Basilar artery is patent. Major cerebellar artery origins are patent. Posterior cerebral arteries are patent. Venous sinuses: Patent as allowed by contrast bolus timing. Review of the MIP images confirms the above findings IMPRESSION: There is no acute intracranial hemorrhage or evidence of acute infarction. ASPECT score is 10. No large vessel occlusion. Moderate narrowing of the distal left cavernous and paraclinoid ICA presumably secondary to circumferential noncalcified plaque. Initial results were communicated to Dr. Lorrin Goodell at 12:58 pm on 09/30/2020 by text page via the [facility] messaging system. Electronically Signed   By: Macy Mis M.D.   On: 09/30/2020 13:16        Scheduled Meds:  amiodarone  200 mg Oral BID   apixaban  5 mg Oral BID   atorvastatin  80 mg Oral QHS   carvedilol  12.5 mg Oral BID   famotidine  20 mg Oral BID   fenofibrate  54 mg Oral q AM   furosemide  40 mg Oral Daily   hydrALAZINE  50 mg Oral TID   icosapent Ethyl  2 g Oral BID   insulin aspart  0-9 Units Subcutaneous TID WC   insulin glargine  24 Units Subcutaneous q AM   isosorbide mononitrate  60 mg Oral q AM   latanoprost  1 drop Both Eyes QHS   pantoprazole  40 mg Oral BID   sertraline  50 mg Oral Daily   sodium chloride flush  3 mL Intravenous Once   Continuous Infusions:  sodium chloride 75 mL/hr at 10/02/20 0546     LOS: 2 days    Time spent: 48 min    Nicolette Bang, MD [facility]  If 7PM-7AM, please contact night-coverage  10/02/2020, 12:56 PM

## 2020-10-02 NOTE — Progress Notes (Signed)
STROKE TEAM PROGRESS NOTE   INTERVAL HISTORY No family is present in the room. Patient is deaf amd sign language intertpreeter was not present for this visit and patient followed pantomime commands well.  Therapy recommendations are for inpatient rehab.  CT scan of the brain x2 shows no acute stroke patient still has some mild right-sided weakness and subjective numbness. Vitals:   10/02/20 0400 10/02/20 0500 10/02/20 0733 10/02/20 1208  BP: 118/77  96/66 118/67  Pulse: 80  85 81  Resp: '16  18 18  '$ Temp: 98.6 F (37 C)  98.3 F (36.8 C) 97.9 F (36.6 C)  TempSrc: Oral  Oral Oral  SpO2: 99%  99% 100%  Weight:  102.9 kg    Height:       CBC:  Recent Labs  Lab 09/30/20 1237 10/01/20 0235  WBC 5.4 5.3  NEUTROABS 4.1 3.7  HGB 11.1* 11.2*  HCT 35.5* 35.2*  MCV 92.4 91.4  PLT 131* [id number]*   Basic Metabolic Panel:  Recent Labs  Lab 09/30/20 1237 10/01/20 0235  NA 129* 132*  K 4.0 3.9  CL 93* 92*  CO2 23 25  GLUCOSE 434* 285*  BUN 32* 32*  CREATININE 2.59* 2.56*  CALCIUM 9.1 9.4   Lipid Panel:  Recent Labs  Lab 10/01/20 0235  CHOL 355*  TRIG 471*  HDL 46  CHOLHDL 7.7  VLDL UNABLE TO CALCULATE IF TRIGLYCERIDE OVER 400 mg/dL  LDLCALC UNABLE TO CALCULATE IF TRIGLYCERIDE OVER 400 mg/dL   HgbA1c:  Recent Labs  Lab 09/30/20 2204  HGBA1C 9.7*   Urine Drug Screen: No results for input(s): LABOPIA, COCAINSCRNUR, LABBENZ, AMPHETMU, THCU, LABBARB in the last 168 hours.  Alcohol Level No results for input(s): ETH in the last 168 hours.  IMAGING past 24 hours No results found.  PHYSICAL EXAM General: Appears well-developed middle-age African-American male not in distress..  . Afebrile. Head is nontraumatic. Neck is supple without bruit.    Cardiac exam no murmur or gallop. Lungs are clear to auscultation. Distal pulses are well felt.  Neurological Exam : limited as no sign language interpreter present and patient is deaf.  Awake alert umable to speak. Mute and deaf.   Extraocular movements are full range without nystagmus.  Blinks to threat bilaterally.  Face is symmetric without weakness.  Tongue is midline.  Motor system exam shows no  drift with mild weakness of right grip and intrinsic hand muscles and orbits left over right upper extremity.  Trace weakness of right hip flexors and ankle dorsiflexors.  Subjective diminished touch pinprick sensation in the right face, upper and lower extremities.  Coordination intact.  Gait not tested.  ASSESSMENT/PLAN  [name] is a 50 y.o. male with stroke risk factors of HTN, HLD, DM2, OSA, DVT/PE, AF with RVR, CHF, and nonischemic cardiomyopathy with ICD in place who presented as a Code Stroke for evaluation of Rt weakness and decreased sensation of the Rt hemibody. Patient not a tPA candidate due to full anticoagulation on Eliquis. IR not activated for thrombectomy due to no LVO suspected from patient presentation or identified on CT angiography.   Suspect small left subcortical infarct due to small vessel disease and patient with atrial fibrillation and cardiomyopathy on Eliquis code Stroke CT head No acute abnormality. Small vessel disease. ASPECTS 10.    CTA head & neck No LVO. Moderate narrowing of distal Lt cavernous and paraclinoid ICA. MRI  unable d/t ICD placement 2D Echo 40% EF, LV mod decreased  function, LV global hypokinesis LDL UNABLE TO CALCULATE IF TRIGLYCERIDE OVER 400 mg/dL HgbA1c 9.7 VTE prophylaxis - On Eliquis    Diet   Diet heart healthy/carb modified Room service appropriate? Yes; Fluid consistency: Thin   Eliquis (apixaban) daily prior to admission, now on Eliquis (apixaban) daily.  Therapy recommendations:  pending Disposition:  pending  Hypertension Home meds:  Coreg, Apresoline, Imdur Stable Permissive hypertension (OK if < 220/120) but gradually normalize in 5-7 days Long-term BP goal normotensive  Mixed Hyperlipidemia/TG Home meds:  Lipitor '80mg'$ , fenofibrate '54mg'$ , Vascepa   LDL UNABLE TO CALCULATE IF TRIGLYCERIDE OVER 400 mg/dL, goal < 70 High intensity statin  Continue statin at discharge ?Compliance on home meds  Diabetes type II Uncontrolled Home meds:  Farxiga, Lantus HgbA1c 9.7, goal < 7.0 CBGs Recent Labs    10/01/20 1543 10/01/20 2128 10/02/20 0639  GLUCAP 378* 190* 175*    SSI  Other Stroke Risk Factors  Obesity, Body mass index is 32.55 kg/m., BMI >/= 30 associated with increased stroke risk, recommend weight loss, diet and exercise as appropriate  Coronary artery disease Obstructive sleep apnea, on CPAP at home Congestive heart failure  Other Active Problems CKD4 Heart failure- on Amiodarone, Coreg, Lasix, Imdur Afib- on Eliquis Depression- continue home [facility] day # 2     Patient presented with right-sided weakness and numbness likely due to small subcortical infarct not visualized on CT scan.     Continue Eliquis for stroke prevention for atrial fibrillation as there is no definite data that switching to Pradaxa or Xarelto is necessarily better..  Continue aggressive risk factor modification with strict control of hypertension blood pressure goal below 130/90, diabetes with hemoglobin A1c goal below 6.5% and lipids with LDL cholesterol goal below 70 mg percent.  Patient counseled to eat a healthy diet to be active and lose weight..   Stroke team will sign off.  Kindly call for questions.  Discussed with Drs. Spongeberg Antony Contras, MD Medical Director [place] Pager: [phone] 10/02/2020 12:36 PM  To contact Stroke Continuity provider, please refer to [email or web address] After hours, contact General Neurology

## 2020-10-02 NOTE — Progress Notes (Signed)
Physical Therapy Treatment Patient Details Name: Troy Rush MRN: [id number] DOB: Jun 10, 1970 Today's Date: 10/02/2020    History of Present Illness 50 y/o male presented to ED on 6/22 as code stroke for c/o R sided numbness, weakness, and dizziness. Recently seen at [facility] ED on 6/21 for N/V and suicidal ideation. CT head (-) for acute abnormalities. Tested COVID+ on 09/11/20. PMH: HLD, CHF, HTN, Afib, type 2 DM, deaf, depression, CKD stage 4, OSA    PT Comments    Patient progressing towards physical therapy, however limited by decreased activity tolerance. Patient reports increased fatigue after ambulation with IV pole and minA. Patient with drop in BP initially prior to ambulation, however no reports of dizziness or lightheadedness. Continue to recommend comprehensive inpatient rehab (CIR) for post-acute therapy needs.   Orthostatic BPs  Sitting 117/65 HR 83  Standing 96/58 HR 86      Follow Up Recommendations  CIR     Equipment Recommendations  Rolling Troy Rush with 5" wheels    Recommendations for Other Services       Precautions / Restrictions Precautions Precautions: Fall;Other (comment) Precaution Comments: watch BP Restrictions Weight Bearing Restrictions: No    Mobility  Bed Mobility               General bed mobility comments: up in room with NT on arrival    Transfers Overall transfer level: Needs assistance Equipment used: 1 person hand held assist Transfers: Sit to/from Stand Sit to Stand: Min assist         General transfer comment: minA to power up into standing and steady  Ambulation/Gait Ambulation/Gait assistance: Min assist Gait Distance (Feet): 200 Feet Assistive device: IV Pole Gait Pattern/deviations: Step-to pattern;Decreased stride length;Decreased stance time - right;Decreased weight shift to right;Decreased step length - right;Trunk flexed;Wide base of support Gait velocity: decreased   General Gait Details: Initially with steady  moderate pace. After 70', patient reports increased fatigue but willing to continue. Patient requiring minA for balance and safety for final 21' as patient with mild R knee buckling noted due to fatigue.   Stairs             Wheelchair Mobility    Modified Rankin (Stroke Patients Only)       Balance Overall balance assessment: Mild deficits observed, not formally tested                                          Cognition Arousal/Alertness: Awake/alert Behavior During Therapy: WFL for tasks assessed/performed Overall Cognitive Status: Within Functional Limits for tasks assessed                                        Exercises      General Comments        Pertinent Vitals/Pain Pain Assessment: No/denies pain    Home Living                      Prior Function            PT Goals (current goals can now be found in the care plan section) Acute Rehab PT Goals Patient Stated Goal: Get better PT Goal Formulation: With patient Time For Goal Achievement: 10/15/20 Potential to Achieve Goals: Good Progress towards PT goals: Progressing  toward goals    Frequency    Min 4X/week      PT Plan Current plan remains appropriate    Co-evaluation              AM-PAC PT "6 Clicks" Mobility   Outcome Measure  Help needed turning from your back to your side while in a flat bed without using bedrails?: A Little Help needed moving from lying on your back to sitting on the side of a flat bed without using bedrails?: A Little Help needed moving to and from a bed to a chair (including a wheelchair)?: A Little Help needed standing up from a chair using your arms (e.g., wheelchair or bedside chair)?: A Little Help needed to walk in hospital room?: A Little Help needed climbing 3-5 steps with a railing? : A Lot 6 Click Score: 17    End of Session Equipment Utilized During Treatment: Gait belt Activity Tolerance: Patient  tolerated treatment well Patient left: in chair;with call bell/phone within reach Nurse Communication: Mobility status PT Visit Diagnosis: Unsteadiness on feet (R26.81);Muscle weakness (generalized) (M62.81);Other abnormalities of gait and mobility (R26.89);Other symptoms and signs involving the nervous system (R29.898)     Time: [id number] PT Time Calculation (min) (ACUTE ONLY): 29 min  Charges:  $Gait Training: 23-37 mins                     Minal Stuller A. Gilford Rile PT, DPT Acute Rehabilitation Services Pager [phone] Office [phone]    Linna Hoff 10/02/2020, 1:25 PM

## 2020-10-02 NOTE — Consult Note (Signed)
   [facility] Inpatient Consult   10/02/2020  Troy Rush July 26, 1970 [id number]  [place] [facility] [ACO] Patient: Medicare CMS DCE  Primary Care Provider: Pcp, No  patient is active with Dr. Aundra Dubin with the Advanced HF team  Patient screened for less than 30 day re-hospitalization with noted extreme high risk score for unplanned readmission risk.   Review of patient's medical record reveals patient is being recommended for CIR vs SNF.  Plan:  Patient followed by Advanced HF team also with para medicine program noted.   For questions contact:   Natividad Brood, RN BSN [facility] Liaison  [phone] business mobile phone Toll free office [phone]  Fax number: [phone] [email or web address]

## 2020-10-02 NOTE — TOC Progression Note (Signed)
Transition of Care [facility]) - Progression Note    Patient Details  Name: Troy Rush MRN: [id number] Date of Birth: [birth year]  Transition of Care [facility]) CM/SW [place], [place] Phone Number: 10/02/2020, 3:58 PM  Clinical Narrative:   CSW met with patient to discuss discharge plans while ASL interpreter was present. CSW attempted to get rehab admissions while interpreter was on-site, but no one available at the time; will follow up later. CSW explained recommendation for CIR, and patient is in agreement. CSW discussed how a rehab admissions coordinator will talk with him about that and if he will qualify. CSW asked about SNF if patient doesn't qualify for CIR, and patient in agreement. Patient asked about DME to go home, and CSW explained that DME will be setup from rehabilitation whenever he's ready to go home, whatever DME he will need. Patient appreciative of information. CSW to continue to follow.    Expected Discharge Plan: IP Rehab Facility Barriers to Discharge: Continued Medical Work up  Expected Discharge Plan and Services Expected Discharge Plan: [place]   Discharge Planning Services: CM Consult Post Acute Care Choice: IP Rehab Living arrangements for the past 2 months: Apartment                                       Social Determinants of Health (SDOH) Interventions    Readmission Risk Interventions Readmission Risk Prevention Plan 07/09/2020 02/06/2020  Transportation Screening Complete -  PCP or Specialist Appt within 3-5 Days Complete Complete  HRI or [place] Complete Complete  Social Work Consult for [place] Planning/Counseling Complete Complete  Palliative Care Screening Complete Not Applicable  Medication Review Press photographer) Complete Complete  Some recent data might be hidden

## 2020-10-03 ENCOUNTER — Encounter (HOSPITAL_COMMUNITY): Payer: Self-pay | Admitting: Physical Medicine and Rehabilitation

## 2020-10-03 ENCOUNTER — Other Ambulatory Visit: Payer: Self-pay

## 2020-10-03 ENCOUNTER — Inpatient Hospital Stay (HOSPITAL_COMMUNITY)
Admission: RE | Admit: 2020-10-03 | Discharge: 2020-10-08 | DRG: 057 | Disposition: A | Discharge status: Home / Self Care | Payer: Medicare Other | Source: Intra-hospital | Attending: Physical Medicine and Rehabilitation | Admitting: Physical Medicine and Rehabilitation

## 2020-10-03 DIAGNOSIS — R2689 Other abnormalities of gait and mobility: Secondary | ICD-10-CM | POA: Diagnosis present

## 2020-10-03 DIAGNOSIS — I639 Cerebral infarction, unspecified: Secondary | ICD-10-CM | POA: Diagnosis not present

## 2020-10-03 DIAGNOSIS — Z6832 Body mass index (BMI) 32.0-32.9, adult: Secondary | ICD-10-CM | POA: Diagnosis not present

## 2020-10-03 DIAGNOSIS — Z95810 Presence of automatic (implantable) cardiac defibrillator: Secondary | ICD-10-CM

## 2020-10-03 DIAGNOSIS — I5022 Chronic systolic (congestive) heart failure: Secondary | ICD-10-CM | POA: Diagnosis present

## 2020-10-03 DIAGNOSIS — D631 Anemia in chronic kidney disease: Secondary | ICD-10-CM | POA: Diagnosis present

## 2020-10-03 DIAGNOSIS — K5900 Constipation, unspecified: Secondary | ICD-10-CM | POA: Diagnosis present

## 2020-10-03 DIAGNOSIS — K219 Gastro-esophageal reflux disease without esophagitis: Secondary | ICD-10-CM | POA: Diagnosis present

## 2020-10-03 DIAGNOSIS — Z86711 Personal history of pulmonary embolism: Secondary | ICD-10-CM | POA: Diagnosis not present

## 2020-10-03 DIAGNOSIS — E785 Hyperlipidemia, unspecified: Secondary | ICD-10-CM | POA: Diagnosis present

## 2020-10-03 DIAGNOSIS — N184 Chronic kidney disease, stage 4 (severe): Secondary | ICD-10-CM | POA: Diagnosis present

## 2020-10-03 DIAGNOSIS — G4733 Obstructive sleep apnea (adult) (pediatric): Secondary | ICD-10-CM | POA: Diagnosis present

## 2020-10-03 DIAGNOSIS — E669 Obesity, unspecified: Secondary | ICD-10-CM | POA: Diagnosis present

## 2020-10-03 DIAGNOSIS — Z7901 Long term (current) use of anticoagulants: Secondary | ICD-10-CM

## 2020-10-03 DIAGNOSIS — Z7984 Long term (current) use of oral hypoglycemic drugs: Secondary | ICD-10-CM | POA: Diagnosis not present

## 2020-10-03 DIAGNOSIS — E781 Pure hyperglyceridemia: Secondary | ICD-10-CM

## 2020-10-03 DIAGNOSIS — I69398 Other sequelae of cerebral infarction: Secondary | ICD-10-CM | POA: Diagnosis not present

## 2020-10-03 DIAGNOSIS — R531 Weakness: Secondary | ICD-10-CM

## 2020-10-03 DIAGNOSIS — I428 Other cardiomyopathies: Secondary | ICD-10-CM | POA: Diagnosis present

## 2020-10-03 DIAGNOSIS — H913 Deaf nonspeaking, not elsewhere classified: Secondary | ICD-10-CM | POA: Diagnosis present

## 2020-10-03 DIAGNOSIS — Z86718 Personal history of other venous thrombosis and embolism: Secondary | ICD-10-CM

## 2020-10-03 DIAGNOSIS — I130 Hypertensive heart and chronic kidney disease with heart failure and stage 1 through stage 4 chronic kidney disease, or unspecified chronic kidney disease: Secondary | ICD-10-CM | POA: Diagnosis present

## 2020-10-03 DIAGNOSIS — Z823 Family history of stroke: Secondary | ICD-10-CM

## 2020-10-03 DIAGNOSIS — F32A Depression, unspecified: Secondary | ICD-10-CM | POA: Diagnosis present

## 2020-10-03 DIAGNOSIS — I10 Essential (primary) hypertension: Secondary | ICD-10-CM

## 2020-10-03 DIAGNOSIS — Z794 Long term (current) use of insulin: Secondary | ICD-10-CM | POA: Diagnosis not present

## 2020-10-03 DIAGNOSIS — Z79899 Other long term (current) drug therapy: Secondary | ICD-10-CM | POA: Diagnosis not present

## 2020-10-03 DIAGNOSIS — Z8249 Family history of ischemic heart disease and other diseases of the circulatory system: Secondary | ICD-10-CM

## 2020-10-03 DIAGNOSIS — R42 Dizziness and giddiness: Secondary | ICD-10-CM

## 2020-10-03 DIAGNOSIS — E118 Type 2 diabetes mellitus with unspecified complications: Secondary | ICD-10-CM

## 2020-10-03 DIAGNOSIS — E1122 Type 2 diabetes mellitus with diabetic chronic kidney disease: Secondary | ICD-10-CM | POA: Diagnosis present

## 2020-10-03 DIAGNOSIS — F330 Major depressive disorder, recurrent, mild: Secondary | ICD-10-CM

## 2020-10-03 LAB — GLUCOSE, CAPILLARY
Glucose-Capillary: 203 mg/dL — ABNORMAL HIGH (ref 70–99)
Glucose-Capillary: 157 mg/dL — ABNORMAL HIGH (ref 70–99)
Glucose-Capillary: 190 mg/dL — ABNORMAL HIGH (ref 70–99)
Glucose-Capillary: 230 mg/dL — ABNORMAL HIGH (ref 70–99)

## 2020-10-03 MED ORDER — FAMOTIDINE 20 MG PO TABS: 20.0000 mg | ORAL_TABLET | Freq: Two times a day (BID) | ORAL | Status: DC

## 2020-10-03 MED ORDER — ICOSAPENT ETHYL 1 G PO CAPS
2.0000 g | ORAL_CAPSULE | Freq: Two times a day (BID) | ORAL | Status: DC
  Administered 2020-10-03 – 2020-10-08 (×10): 2 g via ORAL
  Filled 2020-10-03 (×11): qty 2

## 2020-10-03 MED ORDER — ISOSORBIDE MONONITRATE ER 60 MG PO TB24: 60.0000 mg | ORAL_TABLET | Freq: Every day | ORAL | Status: DC

## 2020-10-03 MED ORDER — FENOFIBRATE 54 MG PO TABS
54.0000 mg | ORAL_TABLET | Freq: Every morning | ORAL | Status: DC
  Administered 2020-10-04 – 2020-10-08 (×5): 54 mg via ORAL
  Filled 2020-10-03 (×5): qty 1

## 2020-10-03 MED ORDER — PANTOPRAZOLE SODIUM 40 MG PO TBEC
40.0000 mg | DELAYED_RELEASE_TABLET | Freq: Two times a day (BID) | ORAL | Status: DC
  Administered 2020-10-03 – 2020-10-08 (×10): 40 mg via ORAL
  Filled 2020-10-03 (×10): qty 1

## 2020-10-03 MED ORDER — INSULIN ASPART 100 UNIT/ML IJ SOLN
0.0000 [IU] | Freq: Three times a day (TID) | INTRAMUSCULAR | Status: DC
  Administered 2020-10-03 – 2020-10-04 (×2): 2 [IU] via SUBCUTANEOUS
  Administered 2020-10-04: 1 [IU] via SUBCUTANEOUS
  Administered 2020-10-04: 3 [IU] via SUBCUTANEOUS
  Administered 2020-10-05 (×2): 1 [IU] via SUBCUTANEOUS
  Administered 2020-10-05: 2 [IU] via SUBCUTANEOUS
  Administered 2020-10-06: 3 [IU] via SUBCUTANEOUS
  Administered 2020-10-06 – 2020-10-07 (×3): 1 [IU] via SUBCUTANEOUS

## 2020-10-03 MED ORDER — FUROSEMIDE 40 MG PO TABS
40.0000 mg | ORAL_TABLET | Freq: Every day | ORAL | Status: DC
  Administered 2020-10-04 – 2020-10-08 (×5): 40 mg via ORAL
  Filled 2020-10-03 (×5): qty 1

## 2020-10-03 MED ORDER — APIXABAN 5 MG PO TABS: 5.0000 mg | ORAL_TABLET | Freq: Two times a day (BID) | ORAL | Status: DC

## 2020-10-03 MED ORDER — ACETAMINOPHEN 325 MG PO TABS: 650.0000 mg | ORAL_TABLET | ORAL | Status: DC | PRN

## 2020-10-03 MED ORDER — FAMOTIDINE 20 MG PO TABS
20.0000 mg | ORAL_TABLET | Freq: Two times a day (BID) | ORAL | Status: DC
  Administered 2020-10-03 – 2020-10-08 (×10): 20 mg via ORAL
  Filled 2020-10-03 (×10): qty 1

## 2020-10-03 MED ORDER — ICOSAPENT ETHYL 1 G PO CAPS: 2.0000 g | ORAL_CAPSULE | Freq: Two times a day (BID) | ORAL | Status: DC

## 2020-10-03 MED ORDER — CARVEDILOL 12.5 MG PO TABS: 12.5000 mg | ORAL_TABLET | Freq: Two times a day (BID) | ORAL | Status: DC

## 2020-10-03 MED ORDER — ATORVASTATIN CALCIUM 80 MG PO TABS
80.0000 mg | ORAL_TABLET | Freq: Every day | ORAL | Status: DC
  Administered 2020-10-03 – 2020-10-07 (×5): 80 mg via ORAL
  Filled 2020-10-03 (×5): qty 1

## 2020-10-03 MED ORDER — HYDRALAZINE HCL 50 MG PO TABS
50.0000 mg | ORAL_TABLET | Freq: Three times a day (TID) | ORAL | Status: DC
  Administered 2020-10-03 – 2020-10-04 (×2): 50 mg via ORAL
  Filled 2020-10-03 (×2): qty 1

## 2020-10-03 MED ORDER — ISOSORBIDE MONONITRATE ER 60 MG PO TB24
60.0000 mg | ORAL_TABLET | Freq: Every morning | ORAL | Status: DC
  Administered 2020-10-04 – 2020-10-08 (×5): 60 mg via ORAL
  Filled 2020-10-03 (×5): qty 1

## 2020-10-03 MED ORDER — DAPAGLIFLOZIN PROPANEDIOL 5 MG PO TABS: 5.0000 mg | ORAL_TABLET | Freq: Every day | ORAL | Status: DC

## 2020-10-03 MED ORDER — LATANOPROST 0.005 % OP SOLN
1.0000 [drp] | Freq: Every day | OPHTHALMIC | Status: DC
  Administered 2020-10-03 – 2020-10-07 (×5): 1 [drp] via OPHTHALMIC
  Filled 2020-10-03 (×2): qty 2.5

## 2020-10-03 MED ORDER — INSULIN GLARGINE 100 UNIT/ML ~~LOC~~ SOLN: 24.0000 [IU] | Freq: Every morning | SUBCUTANEOUS | Status: DC

## 2020-10-03 MED ORDER — SERTRALINE HCL 50 MG PO TABS: 50.0000 mg | ORAL_TABLET | Freq: Every day | ORAL | Status: DC

## 2020-10-03 MED ORDER — DICYCLOMINE HCL 20 MG PO TABS: 20.0000 mg | ORAL_TABLET | ORAL | Status: DC | PRN

## 2020-10-03 MED ORDER — INSULIN GLARGINE 100 UNIT/ML ~~LOC~~ SOLN
25.0000 [IU] | Freq: Every day | SUBCUTANEOUS | Status: DC
  Administered 2020-10-04 – 2020-10-07 (×4): 25 [IU] via SUBCUTANEOUS
  Filled 2020-10-03 (×4): qty 0.25

## 2020-10-03 MED ORDER — FENOFIBRATE 54 MG PO TABS: 54.0000 mg | ORAL_TABLET | Freq: Every day | ORAL | Status: DC

## 2020-10-03 MED ORDER — DICYCLOMINE HCL 20 MG PO TABS
20.0000 mg | ORAL_TABLET | Freq: Two times a day (BID) | ORAL | Status: DC
  Administered 2020-10-03 – 2020-10-08 (×10): 20 mg via ORAL
  Filled 2020-10-03 (×10): qty 1

## 2020-10-03 MED ORDER — MECLIZINE HCL 25 MG PO TABS
25.0000 mg | ORAL_TABLET | Freq: Three times a day (TID) | ORAL | Status: DC | PRN
  Filled 2020-10-03: qty 1

## 2020-10-03 MED ORDER — CARVEDILOL 12.5 MG PO TABS
12.5000 mg | ORAL_TABLET | Freq: Two times a day (BID) | ORAL | Status: DC
  Administered 2020-10-03 – 2020-10-08 (×10): 12.5 mg via ORAL
  Filled 2020-10-03 (×10): qty 1

## 2020-10-03 MED ORDER — PANTOPRAZOLE SODIUM 40 MG PO TBEC: 40.0000 mg | DELAYED_RELEASE_TABLET | Freq: Two times a day (BID) | ORAL | Status: DC

## 2020-10-03 MED ORDER — HYDRALAZINE HCL 50 MG PO TABS: 50.0000 mg | ORAL_TABLET | Freq: Three times a day (TID) | ORAL | Status: DC

## 2020-10-03 MED ORDER — SERTRALINE HCL 50 MG PO TABS
50.0000 mg | ORAL_TABLET | Freq: Every day | ORAL | Status: DC
  Administered 2020-10-04 – 2020-10-08 (×5): 50 mg via ORAL
  Filled 2020-10-03 (×5): qty 1

## 2020-10-03 MED ORDER — APIXABAN 5 MG PO TABS
5.0000 mg | ORAL_TABLET | Freq: Two times a day (BID) | ORAL | Status: DC
  Administered 2020-10-03 – 2020-10-08 (×10): 5 mg via ORAL
  Filled 2020-10-03 (×10): qty 1

## 2020-10-03 MED ORDER — AMIODARONE HCL 200 MG PO TABS: 200.0000 mg | ORAL_TABLET | Freq: Two times a day (BID) | ORAL | Status: DC

## 2020-10-03 MED ORDER — ATORVASTATIN CALCIUM 80 MG PO TABS: 80.0000 mg | ORAL_TABLET | Freq: Every day | ORAL | Status: DC

## 2020-10-03 MED ORDER — ACETAMINOPHEN 650 MG RE SUPP: 650.0000 mg | RECTAL | Status: DC | PRN

## 2020-10-03 MED ORDER — SENNOSIDES-DOCUSATE SODIUM 8.6-50 MG PO TABS
1.0000 | ORAL_TABLET | Freq: Every evening | ORAL | Status: DC | PRN
  Administered 2020-10-05: 1 via ORAL
  Filled 2020-10-03: qty 1

## 2020-10-03 MED ORDER — ACETAMINOPHEN 160 MG/5ML PO SOLN: 650.0000 mg | ORAL | Status: DC | PRN

## 2020-10-03 MED ORDER — AMIODARONE HCL 200 MG PO TABS
200.0000 mg | ORAL_TABLET | Freq: Two times a day (BID) | ORAL | Status: DC
  Administered 2020-10-03 – 2020-10-08 (×10): 200 mg via ORAL
  Filled 2020-10-03 (×10): qty 1

## 2020-10-03 NOTE — H&P (Signed)
Physical Medicine and Rehabilitation Admission H&P    CC: subcortical infarction  HPI: Troy Rush is a 50 year old man with PMH of HLD, systolic CHF, HTN, atrial fibrillation, nonischemic cardiomyopathy with ICD, DM2, deafness and mutism, hx of DVT/PE on Eliquis, depression, CKD 4, OSA, who presented to the ED with dizziness and right sided numbness and was found to have a left subcortical infarct due to small vessel disease as per neurology. CT Head shows no abnormality and MRI was unobtainable due to ICD placement. Not a tPA candidate given anticoagulation with Eliquis. Admitted to CIR on 6/25 with impaired mobility and ADLs secondary to his stroke.    Review of Systems  Constitutional: Negative.   HENT:  Positive for hearing loss.   Eyes: Negative.   Respiratory: Negative.    Cardiovascular: Negative.   Gastrointestinal: Negative.   Genitourinary: Negative.   Musculoskeletal: Negative.   Skin: Negative.   Neurological:  Positive for weakness.  Endo/Heme/Allergies: Negative.   Psychiatric/Behavioral:  Positive for depression. Negative for suicidal ideas.   Past Medical History:  Diagnosis Date   AICD (automatic cardioverter/defibrillator) present 10/14/2016   Bilateral deafness    Bronchitis    CHF (congestive heart failure) ([place])    Depression    Diabetes mellitus without complication ([place])    DVT, lower extremity ([place]) 07/12/2019   Dyslipidemia    Headache 08/2019   Hypertension    ICD (implantable cardioverter-defibrillator) in place    Insomnia 08/2019   NICM (nonischemic cardiomyopathy) ([place]) 09/15/2016   Pancreatitis    Sleep apnea    Vitamin D deficiency 08/2019   Past Surgical History:  Procedure Laterality Date   BACK SURGERY     CARDIAC CATHETERIZATION     ESOPHAGOGASTRODUODENOSCOPY (EGD) WITH PROPOFOL N/A 02/02/2020   Procedure: ESOPHAGOGASTRODUODENOSCOPY (EGD) WITH PROPOFOL;  Surgeon: Arta Silence, MD;  Location: [facility];  Service: Endoscopy;   Laterality: N/A;   ICD IMPLANT  10/14/2016   ICD IMPLANT N/A 10/14/2016   Procedure: ICD Implant;  Surgeon: Deboraha Sprang, MD;  Location: [facility];  Service: Cardiovascular;  Laterality: N/A;   PRESSURE SENSOR/CARDIOMEMS N/A 09/10/2020   Procedure: PRESSURE SENSOR/CARDIOMEMS;  Surgeon: Larey Dresser, MD;  Location: [facility];  Service: Cardiovascular;  Laterality: N/A;   RIGHT HEART CATH N/A 05/21/2020   Procedure: RIGHT HEART CATH;  Surgeon: Larey Dresser, MD;  Location: [facility] [facility];  Service: Cardiovascular;  Laterality: N/A;   WRIST SURGERY     Family History  Problem Relation Age of Onset   Hypertension Mother    Healthy Father    Stroke Father    Social History:  reports that he has never smoked. He has never used smokeless tobacco. He reports current alcohol use of about 1.0 - 2.0 standard drink of alcohol per week. He reports that he does not use drugs. Allergies:  Allergies  Allergen Reactions   Lisinopril Cough   Medications Prior to Admission  Medication Sig Dispense Refill   amiodarone (PACERONE) 200 MG tablet Take 1 tablet (200 mg total) by mouth 2 (two) times daily. 60 tablet 6   atorvastatin (LIPITOR) 80 MG tablet Take 1 tablet (80 mg total) by mouth at bedtime. 30 tablet 11   carvedilol (COREG) 12.5 MG tablet Take 1 tablet (12.5 mg total) by mouth 2 (two) times daily. 60 tablet 11   dapagliflozin propanediol (FARXIGA) 10 MG TABS tablet Take 1 tablet (10 mg total) by mouth daily before  breakfast. 30 tablet 11   dicyclomine (BENTYL) 20 MG tablet Take 1 tablet (20 mg total) by mouth 2 (two) times daily. 20 tablet 0   ELIQUIS 5 MG TABS tablet TAKE 1 TABLET(5 MG) BY MOUTH TWICE DAILY 60 tablet 11   famotidine (PEPCID) 20 MG tablet Take 1 tablet (20 mg total) by mouth 2 (two) times daily. 30 tablet 0   fenofibrate 54 MG tablet TAKE 1 TABLET (54 MG TOTAL) BY MOUTH DAILY. 90 tablet 0   furosemide (LASIX) 20 MG tablet Take 2 tablets (40 mg total) by  mouth daily. 60 tablet 0   hydrALAZINE (APRESOLINE) 100 MG tablet Take 0.5 tablets (50 mg total) by mouth 3 (three) times daily. 90 tablet 3   insulin glargine (LANTUS) 100 UNIT/ML injection Inject 0.24 mLs (24 Units total) into the skin daily. 10 mL 11   isosorbide mononitrate (IMDUR) 60 MG 24 hr tablet Take 1 tablet (60 mg total) by mouth daily. 30 tablet 11   latanoprost (XALATAN) 0.005 % ophthalmic solution Place 1 drop into both eyes at bedtime.     meclizine (ANTIVERT) 25 MG tablet Take 1 tablet (25 mg total) by mouth 3 (three) times daily as needed for dizziness. 90 tablet 3   ondansetron (ZOFRAN ODT) 4 MG disintegrating tablet Take 1 tablet (4 mg total) by mouth every 8 (eight) hours as needed for nausea or vomiting. 20 tablet 0   pantoprazole (PROTONIX) 40 MG tablet Take 1 tablet (40 mg total) by mouth 2 (two) times daily. 180 tablet 3   sertraline (ZOLOFT) 50 MG tablet Take 1 tablet (50 mg total) by mouth daily. 30 tablet 0   sucralfate (CARAFATE) 1 g tablet Take 1 tablet (1 g total) by mouth 4 (four) times daily -  with meals and at bedtime. 21 tablet 0   VASCEPA 1 g capsule TAKE TWO CAPSULES BY MOUTH TWICE A DAY 120 capsule 5    Drug Regimen Review  Drug regimen was reviewed and remains appropriate with no significant issues identified  Home:     Functional History:    Functional Status:  Mobility:          ADL:    Cognition:      Physical Exam: There were no vitals taken for this visit. Physical Exam Gen: no distress, normal appearing HEENT: oral mucosa pink and moist, NCAT Cardio: Reg rate Chest: normal effort, normal rate of breathing Abd: soft, non-distended Ext: no edema Psych: pleasant, normal affect, smiling and cheerful Skin: intact Neuro: Alert and oriented x3. Mute and deaf (congenital). Able to communicate via ASL very well. Strength intact except for 4/5 right sided hand grip, HF, and DF. Decreased sensation throughout right side.   Results for  orders placed or performed during the hospital encounter of 10/03/20 (from the past 48 hour(s))  Glucose, capillary     Status: Abnormal   Collection Time: 10/03/20  4:37 PM  Result Value Ref Range   Glucose-Capillary 190 (H) 70 - 99 mg/dL    Comment: Glucose reference range applies only to samples taken after fasting for at least 8 hours.   Comment 1 Document in Chart    No results found.     Medical Problem List and Plan: 1.  Impaired mobility and ADLs secondary to left subcortical infarct due to small vessel disease  -patient may shower  -ELOS/Goals: 5-7 days modI 2.  Atrial fibrillation: continue Eliquis and amiodarone 3. Pain Management: N/A 4. Depression: continue zoloft.  5.  Neuropsych: This patient is capable of making decisions on his own behalf. 6. HTN: continue Coreg, Lasix, Hydralazine 7. Type 2 DM: HgbA1c 10.3, Continue Lantus. Cannot resume Farxiga since GFR 29. Increase Lantus to 25U.  8. CKD stage 4: continue to monitor Creatinine weekly/ 9. OSA: CPAP at night 10. Hx of unprovoked DVT and PE: continue eliquis. Outpatient heme referral for hypercoag w/u 11. Hyponatremia: improved, monitor Na weekly.  12. Anemia of chronic disease from CKD: referral for outpatient colon cancer screening.  13. GERD: continue pepcid and protonix 14. Prolonged QT: avoid QT prolonging medications.  15. HLD: continue statin, Vascepa, and finofibrate 16. Obesity BMI 32.55: provide lifestyle education  I have personally performed a face to face diagnostic evaluation, including, but not limited to relevant history and physical exam findings, of this patient and developed relevant assessment and plan.  Additionally, I have reviewed and concur with the physician assistant's documentation above.  Izora Ribas, MD 10/03/2020

## 2020-10-03 NOTE — Plan of Care (Signed)

## 2020-10-03 NOTE — Progress Notes (Signed)
Patient arrived about 1600 to [id number]. Patient alert- no distress. Using ASL video language interpreter- went over rehab process, fall safety and admission information. All questions answered. Skin intact. Call bell left in reach

## 2020-10-03 NOTE — Discharge Summary (Signed)
Physician Discharge Summary  Troy Rush [id number] DOB: 09-11-1970 DOA: 09/30/2020  PCP: Pcp, No  Admit date: 09/30/2020 Discharge date: 10/03/2020  Admitted From: Inpatient Disposition: CIR  Recommendations for Outpatient Follow-up:  Follow up with PCP in 1-2 weeks Please obtain BMP/CBC in one week Please follow up on the following pending results:  Home Health:No Equipment/Devices:NO NEW  Discharge Condition:Stable CODE STATUS:Full code Diet recommendation: Diabetic diet  Brief/Interim Summary: Troy Rush is a 50 y.o. male with medical history significant of hyperlipidemia, systolic CHF, HTN, atrial fibrillation, nonischemic cardiomyopathy with ICD, DM type2, deafness, hx of DVT/PE on eliquis, depression, CKD stage 4, OSA who presented to ER with concerns for a stroke.   He states he woke up and felt normal, but around 10:00Am he had dizziness and felt numb on the right side of his body. He wasn't able to walk due to the dizziness. He felt like his right leg felt frozen and he couldn't use it. He called his nurse and told him to call 911. He called EMS and stroke code was initiated. He states the right side of his body still feels weak and numb, but it has improved some. He can moved his right hand, but it's still hard to use his hand. He denies any drooping face. Has some tingling in his lips and his right side of his face almost has some pain like sensation.  He denies any headache, vision changes. He is not dizzy, but had an episode of dizziness earlier today. No chest pain, shortness of breath, palpitations. He has missed 3 days of his medication due to vomiting. He started taking medication today. No hx of stroke in the past. He is right handed.  He denies any fever/chills, coughing, leg swelling.    Recently seen at [facility] yesterday for SI and covid + and was started on zoloft; however, he has not started this. He denies any SI/HI/AH/VH. Denies any cough, fever, sneezing. Tested  positive for covid at beginning of June   Unilateral weakness with probable subcortical CVA -pt was seen by neuro in consult, prob cva above. Cont eliquis, unable to do MRI 2/2  ICD, seen by CIR in consult anticipate trasnsfer today for rehab.  Head CT was unremarkable x 2   Active Problems:   HTN (hypertension) -continue home meds     Chronic systolic CHF (congestive heart failure) ([place]) -pt wasd mildly overdiuresed and was given gentle hydration -currently euvolemic     NICM (nonischemic cardiomyopathy) ([place]) -ICD. PrOhibits MRI     Type 2 diabetes mellitus with complication, with long-term current use of insulin (HCC) -recent a1c of 10.3. poor control. -continue home lantus and SSI/accuchecks ordered -discussed increases risk of stroke with poor glycemic control.  - Encourage physical exercise/weight loss. -resume farxiga on d/c      History of atrial fibrillation -NSR. Continue tele and his amiodarone/eliquis.      CKD (chronic kidney disease), stage IV (HCC) -AVOIDED RENAL TOXIC MEDS -STABLE     Hyperlipidemia Continue statin, vascepa and fenofibrate. Lipid panel show poor contorl, suspect non-adherence-COUNSELD ON DIET AND NUTRITION     Obstructive sleep apnea - cpap at night     History of DVT (deep vein thrombosis) -unprovoked in 07/2019. Had recurrent PE in 10-/21, but had been off eliquis. -continue eliquis. Consider outpatient heme referral for hypercoag w/u.      Major depressive disorder, recurrent episode ([place]) -seen yesterday for suicide ideation, but does not endorse any si/hi/ah/vh. -no evid  of SI/HI -c/w zoloft   Hyponatremia -corrected with glucose.   Anemia Around baseline Likely ACD from CKD Encourage colon cancer screening outpatient if not done   Positive covid 19 -was positive for covid 6/3 during last hospitalization. Should not have been tested. 20 days out of covid and was asymptomatic at that time. -d/c'd precautions    GERD -continue pepcid and protonix.   Prolonged qt -avoid qt prolonging drugs.  -on low dose zoloft and will need to monitor this.     Discharge Diagnoses:  Principal Problem:   Unilateral weakness Active Problems:   Major depressive disorder, recurrent episode (HCC)   Chronic systolic CHF (congestive heart failure) (HCC)   HTN (hypertension)   NICM (nonischemic cardiomyopathy) (HCC)   Type 2 diabetes mellitus with complication, with long-term current use of insulin (HCC)   CKD (chronic kidney disease), stage IV (HCC)   Hyperlipidemia   Obstructive sleep apnea   History of atrial fibrillation   History of DVT (deep vein thrombosis)   Cerebral thrombosis with cerebral infarction    Discharge Instructions  Discharge Instructions     Diet - low sodium heart healthy   Complete by: As directed    Diet Carb Modified   Complete by: As directed    Increase activity slowly   Complete by: As directed       Allergies as of 10/03/2020       Reactions   Lisinopril Cough        Medication List     STOP taking these medications    Lancets Misc       TAKE these medications    amiodarone 200 MG tablet Commonly known as: PACERONE Take 1 tablet (200 mg total) by mouth 2 (two) times daily.   atorvastatin 80 MG tablet Commonly known as: LIPITOR Take 1 tablet (80 mg total) by mouth at bedtime.   carvedilol 12.5 MG tablet Commonly known as: Coreg Take 1 tablet (12.5 mg total) by mouth 2 (two) times daily.   dapagliflozin propanediol 10 MG Tabs tablet Commonly known as: Farxiga Take 1 tablet (10 mg total) by mouth daily before breakfast.   dicyclomine 20 MG tablet Commonly known as: BENTYL Take 1 tablet (20 mg total) by mouth 2 (two) times daily. What changed:  when to take this reasons to take this   Eliquis 5 MG Tabs tablet Generic drug: apixaban TAKE 1 TABLET(5 MG) BY MOUTH TWICE DAILY What changed: See the new instructions.   famotidine 20 MG  tablet Commonly known as: PEPCID Take 1 tablet (20 mg total) by mouth 2 (two) times daily.   fenofibrate 54 MG tablet TAKE 1 TABLET (54 MG TOTAL) BY MOUTH DAILY. What changed: when to take this   furosemide 20 MG tablet Commonly known as: LASIX Take 2 tablets (40 mg total) by mouth daily.   hydrALAZINE 100 MG tablet Commonly known as: APRESOLINE Take 0.5 tablets (50 mg total) by mouth 3 (three) times daily.   insulin glargine 100 UNIT/ML injection Commonly known as: LANTUS Inject 0.24 mLs (24 Units total) into the skin daily. What changed: when to take this   isosorbide mononitrate 60 MG 24 hr tablet Commonly known as: IMDUR Take 1 tablet (60 mg total) by mouth daily. What changed: when to take this   latanoprost 0.005 % ophthalmic solution Commonly known as: XALATAN Place 1 drop into both eyes at bedtime.   meclizine 25 MG tablet Commonly known as: ANTIVERT Take 1 tablet (25 mg total)  by mouth 3 (three) times daily as needed for dizziness. What changed: when to take this   ondansetron 4 MG disintegrating tablet Commonly known as: Zofran ODT Take 1 tablet (4 mg total) by mouth every 8 (eight) hours as needed for nausea or vomiting.   pantoprazole 40 MG tablet Commonly known as: PROTONIX Take 1 tablet (40 mg total) by mouth 2 (two) times daily.   sertraline 50 MG tablet Commonly known as: ZOLOFT Take 1 tablet (50 mg total) by mouth daily.   sucralfate 1 g tablet Commonly known as: Carafate Take 1 tablet (1 g total) by mouth 4 (four) times daily -  with meals and at bedtime.   Vascepa 1 g capsule Generic drug: icosapent Ethyl TAKE TWO CAPSULES BY MOUTH TWICE A DAY What changed:  how much to take when to take this        Allergies  Allergen Reactions   Lisinopril Cough    Consultations: NEURO, CIR   Procedures/Studies: DG Chest 2 View  Result Date: 09/29/2020 CLINICAL DATA:  Shortness of breath. EXAM: CHEST - 2 VIEW COMPARISON:  09/11/2020  FINDINGS: The right ventricular pacer wire is stable. The CardioMEMS pulmonary artery pressure device is again noted. The heart is borderline enlarged but unchanged. The mediastinal and hilar contours are within normal limits and unchanged. No acute pulmonary findings. No infiltrates or effusions. No pneumothorax. The bony thorax is intact. IMPRESSION: No acute cardiopulmonary findings. Electronically Signed   By: Marijo Sanes M.D.   On: 09/29/2020 11:56   DG Chest 2 View  Result Date: 09/11/2020 CLINICAL DATA:  CHF EXAM: CHEST - 2 VIEW COMPARISON:  07/07/2020 FINDINGS: AICD unchanged in position. Cardiac enlargement without heart failure. No edema or effusion. Slight atelectasis in the lung bases. IMPRESSION: Mild bibasilar atelectasis.  Negative for heart failure Electronically Signed   By: Franchot Gallo M.D.   On: 09/11/2020 08:10   CT HEAD WO CONTRAST  Result Date: 10/01/2020 CLINICAL DATA:  Neuro deficit.  Acute stroke suspected.  Follow-up. EXAM: CT HEAD WITHOUT CONTRAST TECHNIQUE: Contiguous axial images were obtained from the base of the skull through the vertex without intravenous contrast. COMPARISON:  09/30/2020 FINDINGS: Brain: No evidence of acute infarction, hemorrhage, hydrocephalus, extra-axial collection or mass lesion/mass effect. Vascular: No hyperdense vessel or unexpected calcification. Skull: Normal. Negative for fracture or focal lesion. Sinuses/Orbits: No acute finding. Other: None. IMPRESSION: No acute intracranial abnormality. Electronically Signed   By: Nolon Nations M.D.   On: 10/01/2020 13:56   CARDIAC CATHETERIZATION  Result Date: 09/10/2020 Successful Cardiomems pressure sensor placement.   CUP PACEART REMOTE DEVICE CHECK  Result Date: 09/22/2020 Scheduled remote reviewed. Normal device function.  Next remote 91 days. HB  CT HEAD CODE STROKE WO CONTRAST  Result Date: 09/30/2020 CLINICAL DATA:  Code stroke EXAM: CT HEAD WITHOUT CONTRAST CT ANGIOGRAPHY OF THE HEAD  AND NECK TECHNIQUE: Contiguous axial images were obtained from the base of the skull through the vertex without intravenous contrast. Multidetector CT imaging of the head and neck was performed using the standard protocol during bolus administration of intravenous contrast. Multiplanar CT image reconstructions and MIPs were obtained to evaluate the vascular anatomy. Carotid stenosis measurements (when applicable) are obtained utilizing NASCET criteria, using the distal internal carotid diameter as the denominator. CONTRAST:  166m OMNIPAQUE IOHEXOL 350 MG/ML SOLN COMPARISON:  05/05/2020 CT head FINDINGS: CT HEAD Brain: There is no acute intracranial hemorrhage, mass effect, or edema. Gray-white differentiation is preserved. There is no extra-axial fluid collection.  Ventricles and sulci are stable in size and configuration. Vascular: No hyperdense vessel. Skull: Calvarium is unremarkable. Sinuses/Orbits: No acute finding. Other: None. Review of the MIP images confirms the above findings ASPECTS [facility] Stroke Program Early CT Score) - Ganglionic level infarction (caudate, lentiform nuclei, internal capsule, insula, M1-M3 cortex): 7 - Supraganglionic infarction (M4-M6 cortex): 3 Total score (0-10 with 10 being normal): 10 CTA NECK Aortic arch: Great vessel origins are patent. Right carotid system: Patent. No stenosis or evidence of dissection. Left carotid system: Patent.  No stenosis or evidence of dissection. Vertebral arteries: Patent and nearly codominant. No stenosis or evidence of dissection. Skeleton: No significant osseous abnormality. Other neck: Unremarkable. Upper chest: Included upper lobes are clear.  Left chest wall ICD. Review of the MIP images confirms the above findings CTA HEAD Anterior circulation: Intracranial internal carotid arteries are patent. There is moderate narrowing of the left distal cavernous and paraclinoid ICA presumably secondary to circumferential noncalcified plaque. Anterior  cerebral arteries are patent. Anterior communicating artery is present. Middle cerebral arteries are patent. Posterior circulation: Intracranial vertebral arteries are patent. Basilar artery is patent. Major cerebellar artery origins are patent. Posterior cerebral arteries are patent. Venous sinuses: Patent as allowed by contrast bolus timing. Review of the MIP images confirms the above findings IMPRESSION: There is no acute intracranial hemorrhage or evidence of acute infarction. ASPECT score is 10. No large vessel occlusion. Moderate narrowing of the distal left cavernous and paraclinoid ICA presumably secondary to circumferential noncalcified plaque. Initial results were communicated to Dr. Lorrin Goodell at 12:58 pm on 09/30/2020 by text page via the [facility] messaging system. Electronically Signed   By: Macy Mis M.D.   On: 09/30/2020 13:16   ECHOCARDIOGRAM LIMITED  Result Date: 09/30/2020    ECHOCARDIOGRAM REPORT   Patient Name:   [name] Date of Exam: 09/30/2020 Medical Rec #:  [id number]  Height:       70.0 in Accession #:    [id number] Weight:       231.9 lb Date of Birth:  01-19-1971 BSA:          2.223 m Patient Age:    50 years   BP:           145/88 mmHg Patient Gender: M          HR:           82 bpm. Exam Location:  Inpatient Procedure: Limited Echo, Color Doppler and Cardiac Doppler Indications:    Stroke i63.9  History:        Patient has prior history of Echocardiogram examinations, most                 recent 02/06/2020. CHF, Defibrillator; Risk                 Factors:Hypertension, Diabetes, Dyslipidemia and Sleep Apnea.  Sonographer:    Raquel Sarna Senior RDCS Referring Phys: Anibal Henderson  Sonographer Comments: COVID+ at time of study IMPRESSIONS  1. Left ventricular ejection fraction, by estimation, is 35 to 40%. The left ventricle has moderately decreased function. The left ventricle demonstrates global hypokinesis. Left ventricular diastolic function could not be evaluated.  2. Right  ventricular systolic function is normal. The right ventricular size is normal. There is normal pulmonary artery systolic pressure.  3. The mitral valve is normal in structure. No evidence of mitral valve regurgitation. No evidence of mitral stenosis.  4. The aortic valve is tricuspid. Aortic valve regurgitation is not visualized. No aortic stenosis  is present.  5. The inferior vena cava is dilated in size with <50% respiratory variability, suggesting right atrial pressure of 15 mmHg. FINDINGS  Left Ventricle: Left ventricular ejection fraction, by estimation, is 35 to 40%. The left ventricle has moderately decreased function. The left ventricle demonstrates global hypokinesis. The left ventricular internal cavity size was normal in size. There is no left ventricular hypertrophy. Left ventricular diastolic function could not be evaluated. Right Ventricle: The right ventricular size is normal. No increase in right ventricular wall thickness. Right ventricular systolic function is normal. There is normal pulmonary artery systolic pressure. The tricuspid regurgitant velocity is 1.86 m/s, and  with an assumed right atrial pressure of 15 mmHg, the estimated right ventricular systolic pressure is 0000000 mmHg. Left Atrium: Left atrial size was normal in size. Right Atrium: Right atrial size was normal in size. Pericardium: There is no evidence of pericardial effusion. Mitral Valve: The mitral valve is normal in structure. No evidence of mitral valve regurgitation. No evidence of mitral valve stenosis. Tricuspid Valve: The tricuspid valve is normal in structure. Tricuspid valve regurgitation is mild . No evidence of tricuspid stenosis. Aortic Valve: The aortic valve is tricuspid. Aortic valve regurgitation is not visualized. No aortic stenosis is present. Pulmonic Valve: The pulmonic valve was normal in structure. Pulmonic valve regurgitation is not visualized. No evidence of pulmonic stenosis. Aorta: The aortic root is normal  in size and structure. Venous: The inferior vena cava is dilated in size with less than 50% respiratory variability, suggesting right atrial pressure of 15 mmHg. IAS/Shunts: No atrial level shunt detected by color flow Doppler.  AORTIC VALVE LVOT Vmax:   70.50 cm/s LVOT Vmean:  53.100 cm/s LVOT VTI:    0.142 m TRICUSPID VALVE TR Peak grad:   13.8 mmHg TR Vmax:        186.00 cm/s  SHUNTS Systemic VTI: 0.14 m Skeet Latch MD Electronically signed by Skeet Latch MD Signature Date/Time: 09/30/2020/5:45:29 PM    Final    CT ANGIO HEAD NECK W WO CM (CODE STROKE)  Result Date: 09/30/2020 CLINICAL DATA:  Code stroke EXAM: CT HEAD WITHOUT CONTRAST CT ANGIOGRAPHY OF THE HEAD AND NECK TECHNIQUE: Contiguous axial images were obtained from the base of the skull through the vertex without intravenous contrast. Multidetector CT imaging of the head and neck was performed using the standard protocol during bolus administration of intravenous contrast. Multiplanar CT image reconstructions and MIPs were obtained to evaluate the vascular anatomy. Carotid stenosis measurements (when applicable) are obtained utilizing NASCET criteria, using the distal internal carotid diameter as the denominator. CONTRAST:  125m OMNIPAQUE IOHEXOL 350 MG/ML SOLN COMPARISON:  05/05/2020 CT head FINDINGS: CT HEAD Brain: There is no acute intracranial hemorrhage, mass effect, or edema. Gray-white differentiation is preserved. There is no extra-axial fluid collection. Ventricles and sulci are stable in size and configuration. Vascular: No hyperdense vessel. Skull: Calvarium is unremarkable. Sinuses/Orbits: No acute finding. Other: None. Review of the MIP images confirms the above findings [facility]S [facility] - Ganglionic level infarction (caudate, lentiform nuclei, internal capsule, insula, M1-M3 cortex): 7 - Supraganglionic infarction (M4-M6 cortex): 3 Total score (0-10 with 10 being normal): 10 CTA NECK Aortic arch:  Great vessel origins are patent. Right carotid system: Patent. No stenosis or evidence of dissection. Left carotid system: Patent.  No stenosis or evidence of dissection. Vertebral arteries: Patent and nearly codominant. No stenosis or evidence of dissection. Skeleton: No significant osseous abnormality. Other neck: Unremarkable. Upper chest:  Included upper lobes are clear.  Left chest wall ICD. Review of the MIP images confirms the above findings CTA HEAD Anterior circulation: Intracranial internal carotid arteries are patent. There is moderate narrowing of the left distal cavernous and paraclinoid ICA presumably secondary to circumferential noncalcified plaque. Anterior cerebral arteries are patent. Anterior communicating artery is present. Middle cerebral arteries are patent. Posterior circulation: Intracranial vertebral arteries are patent. Basilar artery is patent. Major cerebellar artery origins are patent. Posterior cerebral arteries are patent. Venous sinuses: Patent as allowed by contrast bolus timing. Review of the MIP images confirms the above findings IMPRESSION: There is no acute intracranial hemorrhage or evidence of acute infarction. ASPECT score is 10. No large vessel occlusion. Moderate narrowing of the distal left cavernous and paraclinoid ICA presumably secondary to circumferential noncalcified plaque. Initial results were communicated to Dr. Lorrin Goodell at 12:58 pm on 09/30/2020 by text page via the [facility] messaging system. Electronically Signed   By: Macy Mis M.D.   On: 09/30/2020 13:16      Subjective: DOING WELL OVERNIGHT, PLAN TO DC TO CIR TODAY  Discharge Exam: Vitals:   10/03/20 0510 10/03/20 1008  BP: (!) 163/76 130/65  Pulse: 80 72  Resp: 18 19  Temp: 98.3 F (36.8 C) 97.7 F (36.5 C)  SpO2: 99% 100%   Vitals:   10/03/20 0200 10/03/20 0507 10/03/20 0510 10/03/20 1008  BP: (!) 150/94 (!) 163/76 (!) 163/76 130/65  Pulse: 82 80 80 72  Resp: '16  18 19  '$ Temp: 98.7 F  (37.1 C) 98.3 F (36.8 C) 98.3 F (36.8 C) 97.7 F (36.5 C)  TempSrc: Axillary Oral Oral Oral  SpO2: 99%  99% 100%  Weight:      Height:        General: Pt is alert, awake, not in acute distress, deaf Cardiovascular: RRR, S1/S2 +, no rubs, no gallops Respiratory: CTA bilaterally, no wheezing, no rhonchi Abdominal: Soft, NT, ND, bowel sounds + Extremities: no edema, no cyanosis    The results of significant diagnostics from this hospitalization (including imaging, microbiology, ancillary and laboratory) are listed below for reference.     Microbiology: Recent Results (from the past 240 hour(s))  Resp Panel by RT-PCR (Flu A&B, Covid) Nasopharyngeal Swab     Status: Abnormal   Collection Time: 09/30/20  1:15 PM   Specimen: Nasopharyngeal Swab; Nasopharyngeal(NP) swabs in vial transport medium  Result Value Ref Range Status   SARS Coronavirus 2 by RT PCR POSITIVE (A) NEGATIVE Final    Comment: RESULT CALLED TO, READ BACK BY AND VERIFIED WITH: Wyvonna Plum RN [id number] 09/30/20 A BROWNING (NOTE) SARS-CoV-2 target nucleic acids are DETECTED.  The SARS-CoV-2 RNA is generally detectable in upper respiratory specimens during the acute phase of infection. Positive results are indicative of the presence of the identified virus, but do not rule out bacterial infection or co-infection with other pathogens not detected by the test. Clinical correlation with patient history and other diagnostic information is necessary to determine patient infection status. The expected result is Negative.  Fact Sheet for Patients: EntrepreneurPulse.com.au  Fact Sheet for Healthcare Providers: IncredibleEmployment.be  This test is not yet approved or cleared by the [place] FDA and  has been authorized for detection and/or diagnosis of SARS-CoV-2 by FDA under an Emergency Use Authorization (EUA).  This EUA will remain in effect (meaning this test can  be used) for the  duration of  the COVID-19 declaration under Section 564(b)(1) of the Act, 21 U.S.C. section 360bbb-3(b)(1),  unless the authorization is terminated or revoked sooner.     Influenza A by PCR NEGATIVE NEGATIVE Final   Influenza B by PCR NEGATIVE NEGATIVE Final    Comment: (NOTE) The Xpert Xpress SARS-CoV-2/FLU/RSV plus assay is intended as an aid in the diagnosis of influenza from Nasopharyngeal swab specimens and should not be used as a sole basis for treatment. Nasal washings and aspirates are unacceptable for Xpert Xpress SARS-CoV-2/FLU/RSV testing.  Fact Sheet for Patients: EntrepreneurPulse.com.au  Fact Sheet for Healthcare Providers: IncredibleEmployment.be  This test is not yet approved or cleared by the [place] FDA and has been authorized for detection and/or diagnosis of SARS-CoV-2 by FDA under an Emergency Use Authorization (EUA). This EUA will remain in effect (meaning this test can be used) for the duration of the COVID-19 declaration under Section 564(b)(1) of the Act, 21 U.S.C. section 360bbb-3(b)(1), unless the authorization is terminated or revoked.  Performed at [facility], [place] [address] [place], [place] [address]      Labs: BNP (last 3 results) Recent Labs    06/23/20 1718 07/07/20 1335 09/29/20 1248  BNP 333.7* 336.3* [id number]   Basic Metabolic Panel: Recent Labs  Lab 09/29/20 1248 09/30/20 1235 09/30/20 1237 10/01/20 0235  NA 132* 130* 129* 132*  K 5.4* 4.0 4.0 3.9  CL 92* 94* 93* 92*  CO2 27  --  23 25  GLUCOSE 328* 445* 434* 285*  BUN 31* 31* 32* 32*  CREATININE 2.08* 2.60* 2.59* 2.56*  CALCIUM 9.3  --  9.1 9.4   Liver Function Tests: Recent Labs  Lab 09/30/20 1237 10/01/20 0235  AST 44* 28  ALT 42 34  ALKPHOS 48 49  BILITOT 1.4* 1.3*  PROT 6.7 6.6  ALBUMIN 3.2* 3.1*   No results for input(s): LIPASE, AMYLASE in the last 168 hours. No results for input(s): AMMONIA in the last  168 hours. CBC: Recent Labs  Lab 09/30/20 1235 09/30/20 1237 10/01/20 0235  WBC  --  5.4 5.3  NEUTROABS  --  4.1 3.7  HGB 12.2* 11.1* 11.2*  HCT 36.0* 35.5* 35.2*  MCV  --  92.4 91.4  PLT  --  131* 115*   Cardiac Enzymes: No results for input(s): CKTOTAL, CKMB, CKMBINDEX, TROPONINI in the last 168 hours. BNP: Invalid input(s): POCBNP CBG: Recent Labs  Lab 10/02/20 0639 10/02/20 1338 10/02/20 1716 10/02/20 2122 10/03/20 0612  GLUCAP 175* 248* 309* 264* 203*   D-Dimer No results for input(s): DDIMER in the last 72 hours. Hgb A1c Recent Labs    09/30/20 2204  HGBA1C 9.7*   Lipid Profile Recent Labs    10/01/20 0235  CHOL 355*  HDL 46  LDLCALC UNABLE TO CALCULATE IF TRIGLYCERIDE OVER 400 mg/dL  TRIG 471*  CHOLHDL 7.7  LDLDIRECT 189.9*   Thyroid function studies No results for input(s): TSH, T4TOTAL, T3FREE, THYROIDAB in the last 72 hours.  Invalid input(s): FREET3 Anemia work up Recent Labs    09/30/20 2204  TIBC 276  IRON 24*   Urinalysis    Component Value Date/Time   COLORURINE YELLOW 07/07/2020 1505   APPEARANCEUR HAZY (A) 07/07/2020 1505   LABSPEC 1.020 07/07/2020 1505   PHURINE 5.0 07/07/2020 1505   GLUCOSEU >=500 (A) 07/07/2020 1505   HGBUR NEGATIVE 07/07/2020 [place] 07/07/2020 1505   BILIRUBINUR Negative 08/13/2019 1103   KETONESUR 5 (A) 07/07/2020 1505   PROTEINUR 100 (A) 07/07/2020 1505   UROBILINOGEN 0.2 08/13/2019 1103   UROBILINOGEN 0.2  05/09/2017 1130   NITRITE NEGATIVE 07/07/2020 1505   LEUKOCYTESUR NEGATIVE 07/07/2020 1505   Sepsis Labs Invalid input(s): PROCALCITONIN,  WBC,  LACTICIDVEN Microbiology Recent Results (from the past 240 hour(s))  Resp Panel by RT-PCR (Flu A&B, Covid) Nasopharyngeal Swab     Status: Abnormal   Collection Time: 09/30/20  1:15 PM   Specimen: Nasopharyngeal Swab; Nasopharyngeal(NP) swabs in vial transport medium  Result Value Ref Range Status   SARS Coronavirus 2 by RT PCR  POSITIVE (A) NEGATIVE Final    Comment: RESULT CALLED TO, READ BACK BY AND VERIFIED WITH: Wyvonna Plum RN [id number] 09/30/20 A BROWNING (NOTE) SARS-CoV-2 target nucleic acids are DETECTED.  The SARS-CoV-2 RNA is generally detectable in upper respiratory specimens during the acute phase of infection. Positive results are indicative of the presence of the identified virus, but do not rule out bacterial infection or co-infection with other pathogens not detected by the test. Clinical correlation with patient history and other diagnostic information is necessary to determine patient infection status. The expected result is Negative.  Fact Sheet for Patients: EntrepreneurPulse.com.au  Fact Sheet for Healthcare Providers: IncredibleEmployment.be  This test is not yet approved or cleared by the [place] FDA and  has been authorized for detection and/or diagnosis of SARS-CoV-2 by FDA under an Emergency Use Authorization (EUA).  This EUA will remain in effect (meaning this test can  be used) for the duration of  the COVID-19 declaration under Section 564(b)(1) of the Act, 21 U.S.C. section 360bbb-3(b)(1), unless the authorization is terminated or revoked sooner.     Influenza A by PCR NEGATIVE NEGATIVE Final   Influenza B by PCR NEGATIVE NEGATIVE Final    Comment: (NOTE) The Xpert Xpress SARS-CoV-2/FLU/RSV plus assay is intended as an aid in the diagnosis of influenza from Nasopharyngeal swab specimens and should not be used as a sole basis for treatment. Nasal washings and aspirates are unacceptable for Xpert Xpress SARS-CoV-2/FLU/RSV testing.  Fact Sheet for Patients: EntrepreneurPulse.com.au  Fact Sheet for Healthcare Providers: IncredibleEmployment.be  This test is not yet approved or cleared by the [place] FDA and has been authorized for detection and/or diagnosis of SARS-CoV-2 by FDA under an Emergency  Use Authorization (EUA). This EUA will remain in effect (meaning this test can be used) for the duration of the COVID-19 declaration under Section 564(b)(1) of the Act, 21 U.S.C. section 360bbb-3(b)(1), unless the authorization is terminated or revoked.  Performed at [facility], [place] [address], [place], [place] [address]      Time coordinating discharge: Over 30 minutes  SIGNED:   Nicolette Bang, MD  [facility] 10/03/2020, 10:33 AM Pager   If 7PM-7AM, please contact night-coverage [email or web address] Password [email or web address]

## 2020-10-03 NOTE — H&P (Signed)
Physical Medicine and Rehabilitation Admission H&P    Chief Complaint  Patient presents with   Code Stroke  : HPI: [name] is a 50 year old man with PMH of HLD, systolic CHF, HTN, atrial fibrillation, nonischemic cardiomyopathy with ICD, DM2, deafness and mutism, hx of DVT/PE on Eliquis, depression, CKD 4, OSA, who presented to the ED with dizziness and right sided numbness and was found to have a left subcortical infarct due to small vessel disease as per neurology. CT Head shows no abnormality and MRI was unobtainable due to ICD placement. Not a tPA candidate given anticoagulation with Eliquis. Admitted to CIR on 6/25 with impaired mobility and ADLs secondary to his stroke.    Review of Systems  Constitutional: Negative.   HENT:  Positive for hearing loss.   Eyes: Negative.   Respiratory: Negative.    Cardiovascular: Negative.   Gastrointestinal: Negative.   Genitourinary: Negative.   Musculoskeletal: Negative.   Skin: Negative.   Neurological:  Positive for weakness.  Endo/Heme/Allergies: Negative.   Psychiatric/Behavioral:  Positive for depression. Negative for suicidal ideas.   Past Medical History:  Diagnosis Date   AICD (automatic cardioverter/defibrillator) present 10/14/2016   Bilateral deafness    Bronchitis    CHF (congestive heart failure) ([place])    Depression    Diabetes mellitus without complication ([place])    DVT, lower extremity ([place]) 07/12/2019   Dyslipidemia    Headache 08/2019   Hypertension    ICD (implantable cardioverter-defibrillator) in place    Insomnia 08/2019   NICM (nonischemic cardiomyopathy) ([place]) 09/15/2016   Pancreatitis    Sleep apnea    Vitamin D deficiency 08/2019   Past Surgical History:  Procedure Laterality Date   BACK SURGERY     CARDIAC CATHETERIZATION     ESOPHAGOGASTRODUODENOSCOPY (EGD) WITH PROPOFOL N/A 02/02/2020   Procedure: ESOPHAGOGASTRODUODENOSCOPY (EGD) WITH PROPOFOL;  Surgeon: Arta Silence, MD;  Location: [facility]  ENDOSCOPY;  Service: Endoscopy;  Laterality: N/A;   ICD IMPLANT  10/14/2016   ICD IMPLANT N/A 10/14/2016   Procedure: ICD Implant;  Surgeon: Deboraha Sprang, MD;  Location: [facility];  Service: Cardiovascular;  Laterality: N/A;   PRESSURE SENSOR/CARDIOMEMS N/A 09/10/2020   Procedure: PRESSURE SENSOR/CARDIOMEMS;  Surgeon: Larey Dresser, MD;  Location: [facility];  Service: Cardiovascular;  Laterality: N/A;   RIGHT HEART CATH N/A 05/21/2020   Procedure: RIGHT HEART CATH;  Surgeon: Larey Dresser, MD;  Location: [facility];  Service: Cardiovascular;  Laterality: N/A;   WRIST SURGERY     Family History  Problem Relation Age of Onset   Hypertension Mother    Healthy Father    Stroke Father    Social History:  reports that he has never smoked. He has never used smokeless tobacco. He reports current alcohol use of about 1.0 - 2.0 standard drink of alcohol per week. He reports that he does not use drugs. Allergies:  Allergies  Allergen Reactions   Lisinopril Cough   Medications Prior to Admission  Medication Sig Dispense Refill   amiodarone (PACERONE) 200 MG tablet Take 1 tablet (200 mg total) by mouth 2 (two) times daily. 60 tablet 6   atorvastatin (LIPITOR) 80 MG tablet Take 1 tablet (80 mg total) by mouth at bedtime. 30 tablet 11   carvedilol (COREG) 12.5 MG tablet Take 1 tablet (12.5 mg total) by mouth 2 (two) times daily. 60 tablet 11   dapagliflozin propanediol (FARXIGA) 10 MG TABS tablet Take 1  tablet (10 mg total) by mouth daily before breakfast. 30 tablet 11   dicyclomine (BENTYL) 20 MG tablet Take 1 tablet (20 mg total) by mouth 2 (two) times daily. 20 tablet 0   ELIQUIS 5 MG TABS tablet TAKE 1 TABLET(5 MG) BY MOUTH TWICE DAILY 60 tablet 11   fenofibrate 54 MG tablet TAKE 1 TABLET (54 MG TOTAL) BY MOUTH DAILY. 90 tablet 0   furosemide (LASIX) 20 MG tablet Take 2 tablets (40 mg total) by mouth daily. 60 tablet 0   hydrALAZINE (APRESOLINE) 100 MG tablet Take  0.5 tablets (50 mg total) by mouth 3 (three) times daily. 90 tablet 3   insulin glargine (LANTUS) 100 UNIT/ML injection Inject 0.24 mLs (24 Units total) into the skin daily. 10 mL 11   isosorbide mononitrate (IMDUR) 60 MG 24 hr tablet Take 1 tablet (60 mg total) by mouth daily. 30 tablet 11   Lancets MISC 1 each by Does not apply route 3 (three) times daily as needed. 300 each 3   latanoprost (XALATAN) 0.005 % ophthalmic solution Place 1 drop into both eyes at bedtime.     meclizine (ANTIVERT) 25 MG tablet Take 1 tablet (25 mg total) by mouth 3 (three) times daily as needed for dizziness. 90 tablet 3   pantoprazole (PROTONIX) 40 MG tablet Take 1 tablet (40 mg total) by mouth 2 (two) times daily. 180 tablet 3   VASCEPA 1 g capsule TAKE TWO CAPSULES BY MOUTH TWICE A DAY 120 capsule 5   famotidine (PEPCID) 20 MG tablet Take 1 tablet (20 mg total) by mouth 2 (two) times daily. 30 tablet 0   ondansetron (ZOFRAN ODT) 4 MG disintegrating tablet Take 1 tablet (4 mg total) by mouth every 8 (eight) hours as needed for nausea or vomiting. 20 tablet 0   sertraline (ZOLOFT) 50 MG tablet Take 1 tablet (50 mg total) by mouth daily. 30 tablet 0   sucralfate (CARAFATE) 1 g tablet Take 1 tablet (1 g total) by mouth 4 (four) times daily -  with meals and at bedtime. 21 tablet 0    Drug Regimen Review  Drug regimen was reviewed and remains appropriate with no significant issues identified  Home: Home Living Family/patient expects to be discharged to:: Private residence Living Arrangements: Alone Available Help at Discharge: Family, Friend(s), Available PRN/intermittently Type of Home: Apartment Home Access: Stairs to enter Technical brewer of Steps: 2 Entrance Stairs-Rails: Right, Left, Can reach both Home Layout: One level Bathroom Toilet: Standard Home Equipment: None   Functional History: Prior Function Level of Independence: Independent  Functional Status:  Mobility: Bed Mobility General  bed mobility comments: up in room with NT on arrival Transfers Overall transfer level: Needs assistance Equipment used: 1 person hand held assist Transfers: Sit to/from Stand Sit to Stand: Min assist General transfer comment: minA to power up into standing and steady Ambulation/Gait Ambulation/Gait assistance: Min assist Gait Distance (Feet): 200 Feet Assistive device: IV Pole Gait Pattern/deviations: Step-to pattern, Decreased stride length, Decreased stance time - right, Decreased weight shift to right, Decreased step length - right, Trunk flexed, Wide base of support General Gait Details: Initially with steady moderate pace. After 70', patient reports increased fatigue but willing to continue. Patient requiring minA for balance and safety for final 63' as patient with mild R knee buckling noted due to fatigue. Gait velocity: decreased    ADL: ADL Overall ADL's : Needs assistance/impaired Eating/Feeding: Set up, Sitting Grooming: Set up, Sitting Upper Body Bathing: Minimal assistance,  Sitting Lower Body Bathing: Minimal assistance, +2 for physical assistance, Sit to/from stand Upper Body Dressing : Minimal assistance, Sitting Lower Body Dressing: Minimal assistance, Sit to/from stand, +2 for physical assistance Toilet Transfer: Minimal assistance, +2 for physical assistance, Ambulation, RW (simulated to straight back chair) Toilet Transfer Details (indicate cue type and reason): Min A for power up and gaining balance Functional mobility during ADLs: Minimal assistance, +2 for physical assistance, Rolling walker General ADL Comments: Pt presenting with decreased strength, coorindation, and sensation at R side. Pt also with dizziness during activity and BP soft.  Cognition: Cognition Overall Cognitive Status: Within Functional Limits for tasks assessed Orientation Level: Oriented X4 Cognition Arousal/Alertness: Awake/alert Behavior During Therapy: WFL for tasks  assessed/performed Overall Cognitive Status: Within Functional Limits for tasks assessed General Comments: Pt going in and out of lethargy. Possibly due to BP and hypotension  Physical Exam: Blood pressure 116/69, pulse 72, temperature 97.7 F (36.5 C), temperature source Oral, resp. rate 20, height '5\' 10"'$  (1.778 m), weight 102.9 kg, SpO2 100 %. Physical Exam Gen: no distress, normal appearing HEENT: oral mucosa pink and moist, NCAT Cardio: Reg rate Chest: normal effort, normal rate of breathing Abd: soft, non-distended Ext: no edema Psych: pleasant, normal affect, smiling and cheerful Skin: intact Neuro: Alert and oriented x3. Mute and deaf (congenital). Able to communicate via ASL very well. Strength intact except for 4/5 right sided hand grip, HF, and DF. Decreased sensation throughout right side.   Results for orders placed or performed during the hospital encounter of 09/30/20 (from the past 48 hour(s))  Glucose, capillary     Status: Abnormal   Collection Time: 10/01/20  3:43 PM  Result Value Ref Range   Glucose-Capillary 378 (H) 70 - 99 mg/dL    Comment: Glucose reference range applies only to samples taken after fasting for at least 8 hours.  Glucose, capillary     Status: Abnormal   Collection Time: 10/01/20  9:28 PM  Result Value Ref Range   Glucose-Capillary 190 (H) 70 - 99 mg/dL    Comment: Glucose reference range applies only to samples taken after fasting for at least 8 hours.  Glucose, capillary     Status: Abnormal   Collection Time: 10/02/20  6:39 AM  Result Value Ref Range   Glucose-Capillary 175 (H) 70 - 99 mg/dL    Comment: Glucose reference range applies only to samples taken after fasting for at least 8 hours.  Glucose, capillary     Status: Abnormal   Collection Time: 10/02/20  1:38 PM  Result Value Ref Range   Glucose-Capillary 248 (H) 70 - 99 mg/dL    Comment: Glucose reference range applies only to samples taken after fasting for at least 8 hours.   Glucose, capillary     Status: Abnormal   Collection Time: 10/02/20  5:16 PM  Result Value Ref Range   Glucose-Capillary 309 (H) 70 - 99 mg/dL    Comment: Glucose reference range applies only to samples taken after fasting for at least 8 hours.  Glucose, capillary     Status: Abnormal   Collection Time: 10/02/20  9:22 PM  Result Value Ref Range   Glucose-Capillary 264 (H) 70 - 99 mg/dL    Comment: Glucose reference range applies only to samples taken after fasting for at least 8 hours.   Comment 1 Notify RN    Comment 2 Document in Chart   Glucose, capillary     Status: Abnormal   Collection Time: 10/03/20  6:12 AM  Result Value Ref Range   Glucose-Capillary 203 (H) 70 - 99 mg/dL    Comment: Glucose reference range applies only to samples taken after fasting for at least 8 hours.   Comment 1 Notify RN    Comment 2 Document in Chart   Glucose, capillary     Status: Abnormal   Collection Time: 10/03/20 12:19 PM  Result Value Ref Range   Glucose-Capillary 157 (H) 70 - 99 mg/dL    Comment: Glucose reference range applies only to samples taken after fasting for at least 8 hours.   Comment 1 Notify RN    Comment 2 Document in Chart    No results found.     Medical Problem List and Plan: 1.  Impaired mobility and ADLs secondary to left subcortical infarct due to small vessel disease  -patient may shower  -ELOS/Goals: 5-7 days modI 2.  Atrial fibrillation: continue Eliquis and amiodarone 3. Pain Management: N/A 4. Depression: continue zoloft.  5. Neuropsych: This patient is capable of making decisions on his own behalf. 6. HTN: continue Coreg, Lasix, Hydralazine 7. Type 2 DM: HgbA1c 10.3, Continue Lantus. Resume Farxiga.  8. CKD stage 4: continue to monitor Creatinine weekly/ 9. OSA: CPAP at night 10. Hx of unprovoked DVT and PE: continue eliquis. Outpatient heme referral for hypercoag w/u 11. Hyponatremia: improved, monitor Na weekly.  12. Anemia of chronic disease from CKD:  referral for outpatient colon cancer screening.  13. GERD: continue pepcid and protonix 14. Prolonged QT: avoid QT prolonging medications.  15. HLD: continue statin, Vascepa, and finofibrate 16. Obesity BMI 32.55: provide lifestyle education  I have personally performed a face to face diagnostic evaluation, including, but not limited to relevant history and physical exam findings, of this patient and developed relevant assessment and plan.  Additionally, I have reviewed and concur with the physician assistant's documentation above.  Izora Ribas, MD 10/03/2020

## 2020-10-03 NOTE — Progress Notes (Addendum)
Inpatient Rehab Admissions Coordinator:   I have a bed available for Pt. On CIR and will work on getting him moved up. RN may call report to [phone] after 12pm  Clemens Catholic, [place], [place] Admissions Coordinator  [phone] (celll) [phone] (office)

## 2020-10-03 NOTE — Progress Notes (Signed)
Patient transferring to inpatient rehab per MD order. Patient aware and agrees to inpatient rehab admission. Report given to receiving unit Caryl Pina, Therapist, sports.  IV removed, vital signs stable, patient in no acute distress at time of transfer.

## 2020-10-03 NOTE — PMR Pre-admission (Signed)
PMR Admission Coordinator Pre-Admission Assessment  Patient: Troy Rush is an 50 y.o., male MRN: [id number] DOB: [birth year] Height: _0  (177.8 cm) Weight: 102.9 kg  Insurance Information HMO:     PPO:      PCP:      IPA:      80/20:  yes    OTHER:  PRIMARY: Medicare part A and B      Policy#: [id number]      Subscriber: Pt.  hone#: Verified online     Fax#:  Pre-Cert#:       Employer:  Benefits:  Phone #:      Name:  Eff. Date: Parts A ad B effective 03/11/2016  Deduct: $1556      Out of Pocket Max:  None      Life Max: N/A  CIR: 100%      SNF: 100 days Outpatient: 80%     Co-Pay: 20% Home Health: 100%      Co-Pay: none DME: 80%     Co-Pay: 20% Providers: patient's choice  SECONDARY  Medicaid of [place]     Policy#: [id number]     Phone#:   Financial Counselor:       Phone#:   The "Data Collection Information Summary" for patients in Inpatient Rehabilitation Facilities with attached "Privacy Act [place] Records" was provided and verbally reviewed with: Patient  Emergency Contact Information Contact Information     Name Relation Home Work Mobile   Vandagriff,Liatu Daughter [phone]  [phone]       Current Medical History  Patient Admitting Diagnosis: Small SL subcortical infarct History of Present Illness: [name] is a 50 y.o. male with PMH significant for deafness, HTN, HLD, DM2, OSA, DVT/PE, AF with RVR, CHF, and nonischemic cardiomyopathy with ICD in place who presented as a Code Stroke for evaluation of Rt weakness and decreased sensation of the Rt hemibody on 09/30/20.  Patient determined not to be a  a tPA candidate due to full anticoagulation on Eliquis. IR not activated for thrombectomy due to no LVO suspected from patient presentation or identified on CT angiography.CT was clear for acute process; however, Pt. Unable to have an MRI due to ICD.  Pt. Was COVID+ on admission; however, initial positive test was on 09/11/20, so Pt. Was not placed on  COVID precautions.  CIR was consulted to assist in return to PLOF.    Complete NIHSS TOTAL: 0  Patient's medical record from [facility]  has been reviewed by the rehabilitation admission coordinator and physician.  Past Medical History  Past Medical History:  Diagnosis Date   AICD (automatic cardioverter/defibrillator) present 10/14/2016   Bilateral deafness    Bronchitis    CHF (congestive heart failure) ([place])    Depression    Diabetes mellitus without complication ([place])    DVT, lower extremity ([place]) 07/12/2019   Dyslipidemia    Headache 08/2019   Hypertension    ICD (implantable cardioverter-defibrillator) in place    Insomnia 08/2019   NICM (nonischemic cardiomyopathy) ([place]) 09/15/2016   Pancreatitis    Sleep apnea    Vitamin D deficiency 08/2019    Family History   family history includes Healthy in his father; Hypertension in his mother; Stroke in his father.  Prior Rehab/Hospitalizations Has the patient had prior rehab or hospitalizations prior to admission? No  Has the patient had major surgery during 100 days prior to admission? No   Current Medications  Current Facility-Administered Medications:    acetaminophen (TYLENOL) tablet  650 mg, 650 mg, Oral, Q4H PRN, 650 mg at 09/30/20 1757 **OR** acetaminophen (TYLENOL) 160 MG/5ML solution 650 mg, 650 mg, Per Tube, Q4H PRN **OR** acetaminophen (TYLENOL) suppository 650 mg, 650 mg, Rectal, Q4H PRN, Orma Flaming, MD   amiodarone (PACERONE) tablet 200 mg, 200 mg, Oral, BID, Orma Flaming, MD, 200 mg at 10/03/20 0802   apixaban (ELIQUIS) tablet 5 mg, 5 mg, Oral, BID, Orma Flaming, MD, 5 mg at 10/03/20 0802   atorvastatin (LIPITOR) tablet 80 mg, 80 mg, Oral, QHS, Orma Flaming, MD, 80 mg at 10/02/20 2310   carvedilol (COREG) tablet 12.5 mg, 12.5 mg, Oral, BID, Orma Flaming, MD, 12.5 mg at 10/03/20 0802   dicyclomine (BENTYL) tablet 20 mg, 20 mg, Oral, PRN, Orma Flaming, MD, 20 mg at 10/01/20 0028   famotidine (PEPCID)  tablet 20 mg, 20 mg, Oral, BID, Orma Flaming, MD, 20 mg at 10/03/20 0802   fenofibrate tablet 54 mg, 54 mg, Oral, q AM, Orma Flaming, MD, 54 mg at 10/03/20 0750   furosemide (LASIX) tablet 40 mg, 40 mg, Oral, Daily, Spongberg, Audie Pinto, MD, 40 mg at 10/03/20 7253   hydrALAZINE (APRESOLINE) tablet 50 mg, 50 mg, Oral, TID, Spongberg, Audie Pinto, MD, 50 mg at 10/03/20 0802   icosapent Ethyl (VASCEPA) 1 g capsule 2 g, 2 g, Oral, BID, Orma Flaming, MD, 2 g at 10/03/20 0802   insulin aspart (novoLOG) injection 0-9 Units, 0-9 Units, Subcutaneous, TID WC, Orma Flaming, MD, 3 Units at 10/03/20 0751   insulin glargine (LANTUS) injection 24 Units, 24 Units, Subcutaneous, q AM, Orma Flaming, MD, 24 Units at 10/03/20 0750   isosorbide mononitrate (IMDUR) 24 hr tablet 60 mg, 60 mg, Oral, q AM, Spongberg, Audie Pinto, MD, 60 mg at 10/03/20 0750   latanoprost (XALATAN) 0.005 % ophthalmic solution 1 drop, 1 drop, Both Eyes, QHS, Orma Flaming, MD, 1 drop at 10/02/20 2316   meclizine (ANTIVERT) tablet 25 mg, 25 mg, Oral, TID PRN, Orma Flaming, MD   pantoprazole (PROTONIX) EC tablet 40 mg, 40 mg, Oral, BID, Orma Flaming, MD, 40 mg at 10/03/20 0802   senna-docusate (Senokot-S) tablet 1 tablet, 1 tablet, Oral, QHS PRN, Orma Flaming, MD   sertraline (ZOLOFT) tablet 50 mg, 50 mg, Oral, Daily, Orma Flaming, MD, 50 mg at 10/03/20 0802   sodium chloride flush (NS) 0.9 % injection 3 mL, 3 mL, Intravenous, Once, Dorie Rank, MD  Patients Current Diet:  Diet Order             Diet heart healthy/carb modified Room service appropriate? Yes; Fluid consistency: Thin  Diet effective now                   Precautions / Restrictions Precautions Precautions: Fall, Other (comment) Precaution Comments: watch BP Restrictions Weight Bearing Restrictions: No   Has the patient had 2 or more falls or a fall with injury in the past year? Yes  Prior Activity Level Limited Community (1-2x/wk): Pt.  was active in the community PTA  Prior Functional Level Self Care: Did the patient need help bathing, dressing, using the toilet or eating? Independent  Indoor Mobility: Did the patient need assistance with walking from room to room (with or without device)? Independent  Stairs: Did the patient need assistance with internal or external stairs (with or without device)? Independent  Functional Cognition: Did the patient need help planning regular tasks such as shopping or remembering to take medications? Independent  Home Assistive Devices / Equipment Home Assistive Devices/Equipment:  Walker (specify type) Home Equipment: None  Prior Device Use: Indicate devices/aids used by the patient prior to current illness, exacerbation or injury? None of the above  Current Functional Level Cognition  Overall Cognitive Status: Within Functional Limits for tasks assessed Orientation Level: Oriented X4 General Comments: Pt going in and out of lethargy. Possibly due to BP and hypotension    Extremity Assessment (includes Sensation/Coordination)  Upper Extremity Assessment: Defer to OT evaluation RUE Deficits / Details: Reports his face, arm, and leg still feel numb. Decreased strength compared to L. RUE Coordination: decreased gross motor  Lower Extremity Assessment: RLE deficits/detail RLE Deficits / Details: strength WFL but decreased compared to L. Reports numbness in R LE RLE Coordination: decreased gross motor    ADLs  Overall ADL's : Needs assistance/impaired Eating/Feeding: Set up, Sitting Grooming: Set up, Sitting Upper Body Bathing: Minimal assistance, Sitting Lower Body Bathing: Minimal assistance, +2 for physical assistance, Sit to/from stand Upper Body Dressing : Minimal assistance, Sitting Lower Body Dressing: Minimal assistance, Sit to/from stand, +2 for physical assistance Toilet Transfer: Minimal assistance, +2 for physical assistance, Ambulation, RW (simulated to straight back  chair) Toilet Transfer Details (indicate cue type and reason): Min A for power up and gaining balance Functional mobility during ADLs: Minimal assistance, +2 for physical assistance, Rolling walker General ADL Comments: Pt presenting with decreased strength, coorindation, and sensation at R side. Pt also with dizziness during activity and BP soft.    Mobility  General bed mobility comments: up in room with NT on arrival    Transfers  Overall transfer level: Needs assistance Equipment used: 1 person hand held assist Transfers: Sit to/from Stand Sit to Stand: Min assist General transfer comment: minA to power up into standing and steady    Ambulation / Gait / Stairs / Wheelchair Mobility  Ambulation/Gait Ambulation/Gait assistance: Herbalist (Feet): 200 Feet Assistive device: IV Pole Gait Pattern/deviations: Step-to pattern, Decreased stride length, Decreased stance time - right, Decreased weight shift to right, Decreased step length - right, Trunk flexed, Wide base of support General Gait Details: Initially with steady moderate pace. After 70', patient reports increased fatigue but willing to continue. Patient requiring minA for balance and safety for final 45' as patient with mild R knee buckling noted due to fatigue. Gait velocity: decreased    Posture / Balance Balance Overall balance assessment: Mild deficits observed, not formally tested Sitting-balance support: No upper extremity supported, Feet supported Sitting balance-Leahy Scale: Fair Standing balance support: Bilateral upper extremity supported, During functional activity, Single extremity supported Standing balance-Leahy Scale: Poor    Special needs/care consideration Special service needs Pt. Requires ASL interpreter   Previous Home Environment (from acute therapy documentation) Living Arrangements: Alone Available Help at Discharge: Family, Friend(s), Available PRN/intermittently Type of Home:  Apartment Home Layout: One level Home Access: Stairs to enter Entrance Stairs-Rails: Right, Left, Can reach both Entrance Stairs-Number of Steps: 2 Bathroom Toilet: [place]: Yes Type of Home Care Services: Home RN  Discharge Living Setting Plans for Discharge Living Setting: Patient's home, Alone Type of Home at Discharge: Apartment Discharge Home Layout: One level Discharge Home Access: Stairs to enter Entrance Stairs-Rails: Can reach both, Left, Right Entrance Stairs-Number of Steps: 2 Discharge Bathroom Shower/Tub: Tub/shower unit Discharge Bathroom Toilet: Standard Discharge Bathroom Accessibility: Yes How Accessible: Accessible via walker Does the patient have any problems obtaining your medications?: No  Social/Family/Support Systems Patient Roles: Other (Comment)  Goals Patient/Family Goal for Rehab: PT/OT/SLP Mod I  Expected length of stay: 7-10 days Pt/Family Agrees to Admission and willing to participate: Yes Program Orientation Provided & Reviewed with Pt/Caregiver Including Roles  & Responsibilities: Yes  Decrease burden of Care through IP rehab admission: Specialzed equipment needs, Decrease number of caregivers, Bowel and bladder program, and Patient/family education  Possible need for SNF placement upon discharge: not anticipated   Patient Condition: I have reviewed medical records from [facility] , spoken with CM, and patient. I met with patient at the bedside for inpatient rehabilitation assessment.  Patient will benefit from ongoing PT, OT, and SLP, can actively participate in 3 hours of therapy a day 5 days of the week, and can make measurable gains during the admission.  Patient will also benefit from the coordinated team approach during an Inpatient Acute Rehabilitation admission.  The patient will receive intensive therapy as well as Rehabilitation physician, nursing, social worker, and care management interventions.  Due  to safety, skin/wound care, disease management, medication administration, pain management, and patient education the patient requires 24 hour a day rehabilitation nursing.  The patient is currently min A-min+2 with mobility and basic ADLs.  Discharge setting and therapy post discharge at home with home health is anticipated.  Patient has agreed to participate in the Acute Inpatient Rehabilitation Program and will admit today.  Preadmission Screen Completed By:  Genella Mech, 10/03/2020 9:26 AM ______________________________________________________________________   Discussed status with Dr. Ranell Patrick on 10/03/20 at 18 and received approval for admission today.  Admission Coordinator:  Genella Mech, CCC-SLP, time 242 /Date 10/03/20  Assessment/Plan: Diagnosis: Left subcortical infarct due to small vessel disease Does the need for close, 24 hr/day Medical supervision in concert with the patient's rehab needs make it unreasonable for this patient to be served in a less intensive setting? Yes Co-Morbidities requiring supervision/potential complications: atrial fibrillation, HTN, DM2, HLD, OSA Due to bladder management, bowel management, safety, skin/wound care, disease management, medication administration, pain management, and patient education, does the patient require 24 hr/day rehab nursing? Yes Does the patient require coordinated care of a physician, rehab nurse, PT, OT to address physical and functional deficits in the context of the above medical diagnosis(es)? Yes Addressing deficits in the following areas: balance, endurance, locomotion, strength, transferring, bowel/bladder control, bathing, dressing, feeding, grooming, toileting, and psychosocial support Can the patient actively participate in an intensive therapy program of at least 3 hrs of therapy 5 days a week? Yes The potential for patient to make measurable gains while on inpatient rehab is excellent Anticipated functional outcomes  upon discharge from inpatient rehab: modified independent PT, modified independent OT, independent SLP Estimated rehab length of stay to reach the above functional goals is: 5-7 days Anticipated discharge destination: Home 10. Overall Rehab/Functional Prognosis: excellent   MD Signature: Leeroy Cha, MD

## 2020-10-03 NOTE — Progress Notes (Addendum)
PMR Admission Coordinator Pre-Admission Assessment   Patient: Troy Rush is an 50 y.o., male MRN: [id number] DOB: [birth year] Height: '5\' 10"'  (177.8 cm) Weight: 102.9 kg   Insurance Information HMO:     PPO:      PCP:      IPA:      80/20:  yes    OTHER: PRIMARY: Medicare part A and B      Policy#: [id number]      Subscriber: Pt.  hone#: Verified online     Fax#: Pre-Cert#:       Employer: Benefits:  Phone #:      Name: Eff. Date: Parts A ad B effective 03/11/2016  Deduct: $1556      Out of Pocket Max:  None      Life Max: N/A  CIR: 100%      SNF: 100 days Outpatient: 80%     Co-Pay: 20% Home Health: 100%      Co-Pay: none DME: 80%     Co-Pay: 20% Providers: patient's choice   SECONDARY  Medicaid of [place]     Policy#: [id number]     Phone#:   Financial Counselor:       Phone#:   The "Data Collection Information Summary" for patients in Inpatient Rehabilitation Facilities with attached "Privacy Act [place] Records" was provided and verbally reviewed with: Patient   Emergency Contact Information Contact Information       Name Relation Home Work Mobile    Wissmann,Liatu Daughter [phone]   [phone]           Current Medical History  Patient Admitting Diagnosis: Small SL subcortical infarct History of Present Illness: [name] is a 50 y.o. male with PMH significant for deafness, HTN, HLD, DM2, OSA, DVT/PE, AF with RVR, CHF, and nonischemic cardiomyopathy with ICD in place who presented as a Code Stroke for evaluation of Rt weakness and decreased sensation of the Rt hemibody on 09/30/20.  Patient determined not to be a  a tPA candidate due to full anticoagulation on Eliquis. IR not activated for thrombectomy due to no LVO suspected from patient presentation or identified on CT  angiography.CT was clear for acute process; however, Pt. Unable to have an MRI due to ICD.  Pt. Was COVID+ on admission; however, initial positive test was on 09/11/20, so Pt. Was not placed on COVID precautions.  CIR was consulted to assist in return to PLOF.     Complete NIHSS TOTAL: 0   Patient's medical record from [facility]  has been reviewed by the rehabilitation admission coordinator and physician.   Past Medical History      Past Medical History:  Diagnosis Date   AICD (automatic cardioverter/defibrillator) present  10/14/2016   Bilateral deafness     Bronchitis     CHF (congestive heart failure) ([place])     Depression     Diabetes mellitus without complication (HCC)     DVT, lower extremity ([place]) 07/12/2019   Dyslipidemia     Headache 08/2019   Hypertension     ICD (implantable cardioverter-defibrillator) in place     Insomnia 08/2019   NICM (nonischemic cardiomyopathy) ([place]) 09/15/2016   Pancreatitis     Sleep apnea     Vitamin D deficiency 08/2019      Family History   family history includes Healthy in his father; Hypertension in his mother; Stroke in his father.   Prior Rehab/Hospitalizations Has the patient had prior rehab or hospitalizations prior to admission? No   Has the patient had major surgery during 100 days prior to admission? No               Current Medications   Current Facility-Administered Medications:   acetaminophen (TYLENOL) tablet 650 mg, 650 mg, Oral, Q4H PRN, 650 mg at 09/30/20 1757 **OR** acetaminophen (TYLENOL) 160 MG/5ML solution 650 mg, 650 mg, Per Tube, Q4H PRN **OR** acetaminophen (TYLENOL) suppository 650 mg, 650 mg, Rectal, Q4H PRN, Orma Flaming, MD   amiodarone (PACERONE) tablet 200 mg, 200 mg, Oral, BID, Orma Flaming, MD, 200 mg at 10/03/20 0802   apixaban (ELIQUIS) tablet 5 mg, 5 mg, Oral, BID, Orma Flaming, MD, 5 mg at 10/03/20 0802   atorvastatin (LIPITOR) tablet 80 mg, 80 mg, Oral, QHS, Orma Flaming, MD, 80 mg at 10/02/20 2310    carvedilol (COREG) tablet 12.5 mg, 12.5 mg, Oral, BID, Orma Flaming, MD, 12.5 mg at 10/03/20 0802   dicyclomine (BENTYL) tablet 20 mg, 20 mg, Oral, PRN, Orma Flaming, MD, 20 mg at 10/01/20 0028   famotidine (PEPCID) tablet 20 mg, 20 mg, Oral, BID, Orma Flaming, MD, 20 mg at 10/03/20 0802   fenofibrate tablet 54 mg, 54 mg, Oral, q AM, Orma Flaming, MD, 54 mg at 10/03/20 0750   furosemide (LASIX) tablet 40 mg, 40 mg, Oral, Daily, Spongberg, Audie Pinto, MD, 40 mg at 10/03/20 0017   hydrALAZINE (APRESOLINE) tablet 50 mg, 50 mg, Oral, TID, Spongberg, Audie Pinto, MD, 50 mg at 10/03/20 0802   icosapent Ethyl (VASCEPA) 1 g capsule 2 g, 2 g, Oral, BID, Orma Flaming, MD, 2 g at 10/03/20 0802   insulin aspart (novoLOG) injection 0-9 Units, 0-9 Units, Subcutaneous, TID WC, Orma Flaming, MD, 3 Units at 10/03/20 0751   insulin glargine (LANTUS) injection 24 Units, 24 Units, Subcutaneous, q AM, Orma Flaming, MD, 24 Units at 10/03/20 0750   isosorbide mononitrate (IMDUR) 24 hr tablet 60 mg, 60 mg, Oral, q AM, Spongberg, Audie Pinto, MD, 60 mg at 10/03/20 0750   latanoprost (XALATAN) 0.005 % ophthalmic solution 1 drop, 1 drop, Both Eyes, QHS, Orma Flaming, MD, 1 drop at 10/02/20 2316   meclizine (ANTIVERT) tablet 25 mg, 25 mg, Oral, TID PRN, Orma Flaming, MD   pantoprazole (PROTONIX) EC tablet 40 mg, 40 mg, Oral, BID, Orma Flaming, MD, 40 mg at 10/03/20 0802   senna-docusate (Senokot-S) tablet 1 tablet, 1 tablet, Oral, QHS PRN, Orma Flaming, MD   sertraline (ZOLOFT) tablet 50 mg, 50 mg, Oral, Daily, Orma Flaming, MD, 50 mg at 10/03/20 0802   sodium chloride flush (NS) 0.9 % injection 3 mL, 3 mL, Intravenous, Once, Dorie Rank, MD   Patients Current Diet:  Diet Order  Diet heart healthy/carb modified Room service appropriate? Yes; Fluid consistency: Thin  Diet effective now                         Precautions / Restrictions Precautions Precautions: Fall,  Other (comment) Precaution Comments: watch BP Restrictions Weight Bearing Restrictions: No    Has the patient had 2 or more falls or a fall with injury in the past year? Yes   Prior Activity Level Limited Community (1-2x/wk): Pt. was active in the community PTA   Prior Functional Level Self Care: Did the patient need help bathing, dressing, using the toilet or eating? Independent   Indoor Mobility: Did the patient need assistance with walking from room to room (with or without device)? Independent   Stairs: Did the patient need assistance with internal or external stairs (with or without device)? Independent   Functional Cognition: Did the patient need help planning regular tasks such as shopping or remembering to take medications? Independent   Home Assistive Devices / Equipment Home Assistive Devices/Equipment: Gilford Rile (specify type) Home Equipment: None   Prior Device Use: Indicate devices/aids used by the patient prior to current illness, exacerbation or injury? None of the above   Current Functional Level Cognition   Overall Cognitive Status: Within Functional Limits for tasks assessed Orientation Level: Oriented X4 General Comments: Pt going in and out of lethargy. Possibly due to BP and hypotension    Extremity Assessment (includes Sensation/Coordination)   Upper Extremity Assessment: Defer to OT evaluation RUE Deficits / Details: Reports his face, arm, and leg still feel numb. Decreased strength compared to L. RUE Coordination: decreased gross motor  Lower Extremity Assessment: RLE deficits/detail RLE Deficits / Details: strength WFL but decreased compared to L. Reports numbness in R LE RLE Coordination: decreased gross motor     ADLs   Overall ADL's : Needs assistance/impaired Eating/Feeding: Set up, Sitting Grooming: Set up, Sitting Upper Body Bathing: Minimal assistance, Sitting Lower Body Bathing: Minimal assistance, +2 for physical assistance, Sit to/from  stand Upper Body Dressing : Minimal assistance, Sitting Lower Body Dressing: Minimal assistance, Sit to/from stand, +2 for physical assistance Toilet Transfer: Minimal assistance, +2 for physical assistance, Ambulation, RW (simulated to straight back chair) Toilet Transfer Details (indicate cue type and reason): Min A for power up and gaining balance Functional mobility during ADLs: Minimal assistance, +2 for physical assistance, Rolling walker General ADL Comments: Pt presenting with decreased strength, coorindation, and sensation at R side. Pt also with dizziness during activity and BP soft.     Mobility   General bed mobility comments: up in room with NT on arrival     Transfers   Overall transfer level: Needs assistance Equipment used: 1 person hand held assist Transfers: Sit to/from Stand Sit to Stand: Min assist General transfer comment: minA to power up into standing and steady     Ambulation / Gait / Stairs / Wheelchair Mobility   Ambulation/Gait Ambulation/Gait assistance: Herbalist (Feet): 200 Feet Assistive device: IV Pole Gait Pattern/deviations: Step-to pattern, Decreased stride length, Decreased stance time - right, Decreased weight shift to right, Decreased step length - right, Trunk flexed, Wide base of support General Gait Details: Initially with steady moderate pace. After 70', patient reports increased fatigue but willing to continue. Patient requiring minA for balance and safety for final 54' as patient with mild R knee buckling noted due to fatigue. Gait velocity: decreased     Posture / Balance Balance Overall  balance assessment: Mild deficits observed, not formally tested Sitting-balance support: No upper extremity supported, Feet supported Sitting balance-Leahy Scale: Fair Standing balance support: Bilateral upper extremity supported, During functional activity, Single extremity supported Standing balance-Leahy Scale: Poor     Special  needs/care consideration Special service needs Pt. Requires ASL interpreter    Previous Home Environment (from acute therapy documentation) Living Arrangements: Alone Available Help at Discharge: Family, Friend(s), Available PRN/intermittently Type of Home: Apartment Home Layout: One level Home Access: Stairs to enter Entrance Stairs-Rails: Right, Left, Can reach both Entrance Stairs-Number of Steps: 2 Bathroom Toilet: [place]: Yes Type of Home Care Services: Home RN   Discharge Living Setting Plans for Discharge Living Setting: Patient's home, Alone Type of Home at Discharge: Apartment Discharge Home Layout: One level Discharge Home Access: Stairs to enter Entrance Stairs-Rails: Can reach both, Left, Right Entrance Stairs-Number of Steps: 2 Discharge Bathroom Shower/Tub: Tub/shower unit Discharge Bathroom Toilet: Standard Discharge Bathroom Accessibility: Yes How Accessible: Accessible via walker Does the patient have any problems obtaining your medications?: No   Social/Family/Support Systems Patient Roles: Other (Comment)   Goals Patient/Family Goal for Rehab: PT/OT/SLP Mod I Expected length of stay: 7-10 days Pt/Family Agrees to Admission and willing to participate: Yes Program Orientation Provided & Reviewed with Pt/Caregiver Including Roles  & Responsibilities: Yes   Decrease burden of Care through IP rehab admission: Specialzed equipment needs, Decrease number of caregivers, Bowel and bladder program, and Patient/family education   Possible need for SNF placement upon discharge: not anticipated    Patient Condition: I have reviewed medical records from [facility] , spoken with CM, and patient. I met with patient at the bedside for inpatient rehabilitation assessment.  Patient will benefit from ongoing PT, OT, and SLP, can actively participate in 3 hours of therapy a day 5 days of the week, and can make measurable gains during the  admission.  Patient will also benefit from the coordinated team approach during an Inpatient Acute Rehabilitation admission.  The patient will receive intensive therapy as well as Rehabilitation physician, nursing, social worker, and care management interventions.  Due to safety, skin/wound care, disease management, medication administration, pain management, and patient education the patient requires 24 hour a day rehabilitation nursing.  The patient is currently min A-min+2 with mobility and basic ADLs.  Discharge setting and therapy post discharge at home with home health is anticipated.  Patient has agreed to participate in the Acute Inpatient Rehabilitation Program and will admit today.   Preadmission Screen Completed By:  Genella Mech, 10/03/2020 9:26 AM ______________________________________________________________________   Discussed status with Dr. Ranell Patrick on 10/03/20 at 40 and received approval for admission today.   Admission Coordinator:  Genella Mech, CCC-SLP, time 627 /Date 10/03/20   Assessment/Plan: Diagnosis: Left subcortical infarct due to small vessel disease Does the need for close, 24 hr/day Medical supervision in concert with the patient's rehab needs make it unreasonable for this patient to be served in a less intensive setting? Yes Co-Morbidities requiring supervision/potential complications: atrial fibrillation, HTN, DM2, HLD, OSA Due to bladder management, bowel management, safety, skin/wound care, disease management, medication administration, pain management, and patient education, does the patient require 24 hr/day rehab nursing? Yes Does the patient require coordinated care of a physician, rehab nurse, PT, OT to address physical and functional deficits in the context of the above medical diagnosis(es)? Yes Addressing deficits in the following areas: balance, endurance, locomotion, strength, transferring, bowel/bladder control, bathing, dressing, feeding, grooming,  toileting, and psychosocial support Can the patient actively participate in an intensive therapy program of at least 3 hrs of therapy 5 days a week? Yes The potential for patient to make measurable gains while on inpatient rehab is excellent Anticipated functional outcomes upon discharge from inpatient rehab: modified independent PT, modified independent OT, independent SLP Estimated rehab length of stay to reach the above functional goals is: 5-7 days Anticipated discharge destination: Home 10. Overall Rehab/Functional Prognosis: excellent     MD Signature: Leeroy Cha, MD      5

## 2020-10-04 DIAGNOSIS — I639 Cerebral infarction, unspecified: Secondary | ICD-10-CM

## 2020-10-04 LAB — GLUCOSE, CAPILLARY
Glucose-Capillary: 162 mg/dL — ABNORMAL HIGH (ref 70–99)
Glucose-Capillary: 220 mg/dL — ABNORMAL HIGH (ref 70–99)
Glucose-Capillary: 137 mg/dL — ABNORMAL HIGH (ref 70–99)
Glucose-Capillary: 242 mg/dL — ABNORMAL HIGH (ref 70–99)

## 2020-10-04 MED ORDER — POLYETHYLENE GLYCOL 3350 17 G PO PACK
17.0000 g | PACK | Freq: Every day | ORAL | Status: DC
  Administered 2020-10-04 – 2020-10-08 (×5): 17 g via ORAL
  Filled 2020-10-04 (×5): qty 1

## 2020-10-04 MED ORDER — HYDRALAZINE HCL 50 MG PO TABS
75.0000 mg | ORAL_TABLET | Freq: Three times a day (TID) | ORAL | Status: DC
  Administered 2020-10-04 – 2020-10-07 (×9): 75 mg via ORAL
  Filled 2020-10-04 (×9): qty 1

## 2020-10-04 NOTE — Plan of Care (Signed)
  Problem: Education: Goal: Knowledge of disease or condition will improve Description: Patient will be knowledgeable about stroke and management with min assist. Outcome: Progressing   Problem: Consults Goal: RH STROKE PATIENT EDUCATION Description: See Patient Education module for education specifics  Outcome: Progressing Goal: Nutrition Consult-if indicated Outcome: Progressing Goal: Diabetes Guidelines if Diabetic/Glucose > 140 Description: If diabetic or lab glucose is > 140 mg/dl - Initiate Diabetes/Hyperglycemia Guidelines & Document Interventions  Outcome: Progressing   Problem: RH SAFETY Goal: RH STG ADHERE TO SAFETY PRECAUTIONS W/ASSISTANCE/DEVICE Description: STG Adhere to Safety Precautions With min Assistance/Device.  Outcome: Progressing Goal: RH STG DECREASED RISK OF FALL WITH ASSISTANCE Description: STG Decreased Risk of Fall With min Assistance. Outcome: Progressing   Problem: RH KNOWLEDGE DEFICIT Goal: RH STG INCREASE KNOWLEDGE OF DIABETES Description: Increase knowledge about diabetes management with min assist. Outcome: Progressing Goal: RH STG INCREASE KNOWLEDGE OF HYPERTENSION Description: Increase knowledge about hypertension management with min assist. Outcome: Progressing Goal: RH STG INCREASE KNOWLEGDE OF HYPERLIPIDEMIA Description: Increase knowledge about cholesterol  management with min assist. Outcome: Progressing Goal: RH STG INCREASE KNOWLEDGE OF STROKE PROPHYLAXIS Description: Increase knowledge about stroke management with min assist. Outcome: Progressing

## 2020-10-04 NOTE — Progress Notes (Signed)
At 0548 Patient's BP was 164/89 Dr. Ranell Patrick notified. Schedule 0800 am 50 mg hydralazine given at 0616 per MD order.

## 2020-10-04 NOTE — Evaluation (Signed)
Occupational Therapy Assessment and Plan  Patient Details  Name: Troy Rush MRN: [id number] Date of Birth: 20-Mar-1971  OT Diagnosis: hemiplegia affecting dominant side and muscle weakness (generalized) Rehab Potential: Rehab Potential (ACUTE ONLY): Excellent ELOS: 7-10 days   Today's Date: 10/04/2020 OT Individual Time: 1010-1110 OT Individual Time Calculation (min): 60 min     Hospital Problem: Principal Problem:   Subcortical infarction [facility])   Past Medical History:  Past Medical History:  Diagnosis Date   AICD (automatic cardioverter/defibrillator) present 10/14/2016   Bilateral deafness    Bronchitis    CHF (congestive heart failure) ([place])    Depression    Diabetes mellitus without complication ([place])    DVT, lower extremity ([place]) 07/12/2019   Dyslipidemia    Headache 08/2019   Hypertension    ICD (implantable cardioverter-defibrillator) in place    Insomnia 08/2019   NICM (nonischemic cardiomyopathy) ([place]) 09/15/2016   Pancreatitis    Sleep apnea    Vitamin D deficiency 08/2019   Past Surgical History:  Past Surgical History:  Procedure Laterality Date   BACK SURGERY     CARDIAC CATHETERIZATION     ESOPHAGOGASTRODUODENOSCOPY (EGD) WITH PROPOFOL N/A 02/02/2020   Procedure: ESOPHAGOGASTRODUODENOSCOPY (EGD) WITH PROPOFOL;  Surgeon: Arta Silence, MD;  Location: [facility];  Service: Endoscopy;  Laterality: N/A;   ICD IMPLANT  10/14/2016   ICD IMPLANT N/A 10/14/2016   Procedure: ICD Implant;  Surgeon: Deboraha Sprang, MD;  Location: [place] CV LAB;  Service: Cardiovascular;  Laterality: N/A;   PRESSURE SENSOR/CARDIOMEMS N/A 09/10/2020   Procedure: PRESSURE SENSOR/CARDIOMEMS;  Surgeon: Larey Dresser, MD;  Location: [place] CV LAB;  Service: Cardiovascular;  Laterality: N/A;   RIGHT HEART CATH N/A 05/21/2020   Procedure: RIGHT HEART CATH;  Surgeon: Larey Dresser, MD;  Location: [place] CV LAB;  Service: Cardiovascular;  Laterality: N/A;   WRIST  SURGERY      Assessment & Plan Clinical Impression: Troy Rush is a 50 year old man with PMH of HLD, systolic CHF, HTN, atrial fibrillation, nonischemic cardiomyopathy with ICD, DM2, deafness and mutism, hx of DVT/PE on Eliquis, depression, CKD 4, OSA, who presented to the ED with dizziness and right sided numbness and was found to have a left subcortical infarct due to small vessel disease as per neurology. CT Head shows no abnormality and MRI was unobtainable due to ICD placement. Not a tPA candidate given anticoagulation with Eliquis. Admitted to CIR on 6/25 with impaired mobility and ADLs secondary to his stroke.  Patient transferred to CIR on 10/03/2020 .    Patient currently requires min with basic self-care skills and IADL secondary to muscle weakness, decreased coordination, and decreased standing balance, decreased postural control, hemiplegia, and decreased balance strategies.  Prior to hospitalization, patient could complete ADLs with independent .  Patient will benefit from skilled intervention to decrease level of assist with basic self-care skills and increase level of independence with iADL prior to discharge home independently.  Anticipate patient will require intermittent supervision and follow up outpatient.  OT - End of Session Activity Tolerance: Tolerates 30+ min activity without fatigue Endurance Deficit: Yes Endurance Deficit Description: mild SOB with ambulation and stairs OT Assessment Rehab Potential (ACUTE ONLY): Excellent OT Barriers to Discharge: Decreased caregiver support OT Barriers to Discharge Comments: unclear if his daughter will be home by d/c OT Patient demonstrates impairments in the following area(s): Balance;Endurance;Motor;Sensory OT Basic ADL's Functional Problem(s): Bathing;Dressing;Toileting OT Advanced ADL's Functional Problem(s): Simple Meal Preparation;Laundry;Light Housekeeping OT  Transfers Functional Problem(s): Tub/Shower;Toilet OT Additional  Impairment(s): None OT Plan OT Intensity: Minimum of 1-2 x/day, 45 to 90 minutes OT Frequency: 5 out of 7 days OT Duration/Estimated Length of Stay: 7-10 days OT Treatment/Interventions: Balance/vestibular training;Discharge planning;Pain management;Self Care/advanced ADL retraining;Therapeutic Activities;UE/LE Coordination activities;Therapeutic Exercise;Patient/family education;Functional mobility training;Community reintegration;DME/adaptive equipment instruction;Neuromuscular re-education;Psychosocial support;UE/LE Strength taining/ROM OT Self Feeding Anticipated Outcome(s): no goal set OT Basic Self-Care Anticipated Outcome(s): mod I OT Toileting Anticipated Outcome(s): mod I OT Bathroom Transfers Anticipated Outcome(s): mod I OT Recommendation Patient destination: Home Follow Up Recommendations: Outpatient OT Equipment Recommended: Tub/shower seat   OT Evaluation Precautions/Restrictions  Precautions Precautions: Fall;Other (comment) Precaution Comments: watch BP, requires ASL interpretor Restrictions Weight Bearing Restrictions: No General Chart Reviewed: Yes Family/Caregiver Present: No  Pain Pain Assessment Pain Scale: 0-10 Pain Score: 0-No pain Home Living/Prior Functioning Home Living Family/patient expects to be discharged to:: Private residence Living Arrangements: Alone Available Help at Discharge: Family, Friend(s), Available PRN/intermittently (his daughter may be moving to help him) Type of Home: Apartment Home Access: Stairs to enter Technical brewer of Steps: 2 Entrance Stairs-Rails: Right, Left, Can reach both Home Layout: One level Bathroom Shower/Tub: (P) Tub/shower unit Bathroom Toilet: Standard Bathroom Accessibility: (P) Yes  Lives With: Alone IADL History Homemaking Responsibilities: Yes Meal Prep Responsibility: Primary Laundry Responsibility: Primary Cleaning Responsibility: Primary Bill Paying/Finance Responsibility:  Primary Shopping Responsibility: Primary Prior Function Level of Independence: Independent with basic ADLs, Independent with gait, Independent with homemaking with ambulation, Independent with transfers  Able to Take Stairs?: Reciprically Driving: No (uses uber or takes the bus) Biomedical scientist: works for Water engineer for [employer] Baseline Vision/History: No visual deficits Patient Visual Report: No change from baseline Vision Assessment?: Yes Eye Alignment: Within Functional Limits Ocular Range of Motion: Within Functional Limits Alignment/Gaze Preference: Within Defined Limits Tracking/Visual Pursuits: Able to track stimulus in all quads without difficulty Saccades: Within functional limits Perception  Perception: Within Functional Limits Praxis Praxis: Intact Cognition Overall Cognitive Status: Within Functional Limits for tasks assessed Arousal/Alertness: Awake/alert Orientation Level: Person;Place;Situation Person: Oriented Place: Oriented Situation: Oriented Year: 2022 Month: June Day of Week: Correct Memory: Appears intact Immediate Memory Recall: Sock;Blue;Bed Memory Recall Sock: Without Cue Memory Recall Blue: Without Cue Memory Recall Bed: Without Cue Attention: Selective Selective Attention: Appears intact Awareness: Appears intact Problem Solving: Appears intact Safety/Judgment: Appears intact Sensation Sensation Light Touch: Impaired Detail Central sensation comments: Reports it is difficult to describe but has very slight sensation differences in his RUE Proprioception: Appears Intact Coordination Gross Motor Movements are Fluid and Coordinated: No Fine Motor Movements are Fluid and Coordinated: Yes Coordination and Movement Description: mild R hemi and decreased postural control Heel Shin Test: [facility] bilaterally Motor  Motor Motor: Hemiplegia;Abnormal postural alignment and control  Trunk/Postural Assessment  Cervical  Assessment Cervical Assessment: Within Functional Limits Thoracic Assessment Thoracic Assessment: Within Functional Limits Lumbar Assessment Lumbar Assessment: Within Functional Limits Postural Control Postural Control: Deficits on evaluation  Balance Balance Balance Assessed: Yes Static Sitting Balance Static Sitting - Balance Support: Feet unsupported;No upper extremity supported Static Sitting - Level of Assistance: 7: Independent Dynamic Sitting Balance Dynamic Sitting - Balance Support: During functional activity;Feet supported Dynamic Sitting - Level of Assistance: 5: Stand by assistance Static Standing Balance Static Standing - Balance Support: No upper extremity supported;During functional activity Static Standing - Level of Assistance: 5: Stand by assistance Dynamic Standing Balance Dynamic Standing - Balance Support: No upper extremity supported;During functional activity Dynamic Standing - Level of Assistance: 4: Min  assist Extremity/Trunk Assessment RUE Assessment RUE Assessment: Exceptions to [facility] General Strength Comments: Strength is WFL- 4+/5. He reports minor coordination deficits. More formal testing will be helpful LUE Assessment LUE Assessment: Within Functional Limits  Care Tool Care Tool Self Care Eating   Eating Assist Level: Independent    Oral Care    Oral Care Assist Level: Contact Guard/Toucning assist    Bathing   Body parts bathed by patient: Right arm;Right lower leg;Left arm;Left lower leg;Chest;Abdomen;Face;Front perineal area;Right upper leg;Buttocks;Left upper leg     Assist Level: Contact Guard/Touching assist    Upper Body Dressing(including orthotics)   What is the patient wearing?: Pull over shirt   Assist Level: Set up assist    Lower Body Dressing (excluding footwear)   What is the patient wearing?: Pants Assist for lower body dressing: Contact Guard/Touching assist    Putting on/Taking off footwear   What is the patient  wearing?: Non-skid slipper socks Assist for footwear: Set up assist       Care Tool Toileting Toileting activity   Assist for toileting: Contact Guard/Touching assist     Care Tool Bed Mobility Roll left and right activity   Roll left and right assist level: Independent    Sit to lying activity   Sit to lying assist level: Supervision/Verbal cueing    Lying to sitting edge of bed activity   Lying to sitting edge of bed assist level: Supervision/Verbal cueing     Care Tool Transfers Sit to stand transfer   Sit to stand assist level: Contact Guard/Touching assist    Chair/bed transfer   Chair/bed transfer assist level: Contact Guard/Touching assist     Toilet transfer   Assist Level: Contact Guard/Touching assist     Care Tool Cognition Expression of Ideas and Wants Expression of Ideas and Wants: Without difficulty (complex and basic) - expresses complex messages without difficulty and with speech that is clear and easy to understand   Understanding Verbal and Non-Verbal Content Understanding Verbal and Non-Verbal Content: Understands (complex and basic) - clear comprehension without cues or repetitions   Memory/Recall Ability *first 3 days only Memory/Recall Ability *first 3 days only: Current season;Location of own room;Staff names and faces;That he or she is in a hospital/hospital unit    Refer to Care Plan for [place] 1 OT Short Term Goal 1 (Week 1): STG=LTG d/t ELOS  Recommendations for other services: None    Skilled Therapeutic Intervention Skilled OT evaluation completed as detailed above and below. ADLs completed at shower level. Pt is a pleasure to work with, very motivated to return to PLOF. He will likely be home with not full supervision, this would benefit from 7-10 days ELOS to return to mod I level. [employer] ASL interpretor used throughout session. Pt left sitting in recliner with chair alarm set.   ADL ADL Eating:  Independent Where Assessed-Eating: Bed level Grooming: Contact guard Where Assessed-Grooming: Standing at sink Upper Body Bathing: Supervision/safety Where Assessed-Upper Body Bathing: Shower Lower Body Bathing: Contact guard Where Assessed-Lower Body Bathing: Shower Upper Body Dressing: Setup Where Assessed-Upper Body Dressing: Edge of bed Lower Body Dressing: Contact guard Where Assessed-Lower Body Dressing: Edge of bed Toileting: Contact guard Where Assessed-Toileting: Glass blower/designer: Therapist, music Method: Magazine features editor: Minimal assistance;Minimal cueing Social research officer, government Method: Heritage manager: Shower seat with back Mobility  Bed Mobility Bed Mobility: Rolling Right;Rolling Left;Supine to Sit;Sit to Supine Rolling Right: Independent Rolling  Left: Independent Supine to Sit: Supervision/Verbal cueing Sit to Supine: Supervision/Verbal cueing Transfers Sit to Stand: Contact Guard/Touching assist Stand to Sit: Contact Guard/Touching assist   Discharge Criteria: Patient will be discharged from OT if patient refuses treatment 3 consecutive times without medical reason, if treatment goals not met, if there is a change in medical status, if patient makes no progress towards goals or if patient is discharged from hospital.  The above assessment, treatment plan, treatment alternatives and goals were discussed and mutually agreed upon: by patient  Curtis Sites 10/04/2020, 12:47 PM

## 2020-10-04 NOTE — Evaluation (Signed)
Speech Language Pathology Assessment and Plan  Patient Details  Name: Troy Rush MRN: [id number] Date of Birth: 12-23-1970  Today's Date: 10/04/2020 SLP Individual Time: 0973-5329 SLP Individual Time Calculation (min): 33 min   Hospital Problem: Principal Problem:   Subcortical infarction [facility])  Past Medical History:  Past Medical History:  Diagnosis Date   AICD (automatic cardioverter/defibrillator) present 10/14/2016   Bilateral deafness    Bronchitis    CHF (congestive heart failure) ([place])    Depression    Diabetes mellitus without complication ([place])    DVT, lower extremity ([place]) 07/12/2019   Dyslipidemia    Headache 08/2019   Hypertension    ICD (implantable cardioverter-defibrillator) in place    Insomnia 08/2019   NICM (nonischemic cardiomyopathy) ([place]) 09/15/2016   Pancreatitis    Sleep apnea    Vitamin D deficiency 08/2019   Past Surgical History:  Past Surgical History:  Procedure Laterality Date   BACK SURGERY     CARDIAC CATHETERIZATION     ESOPHAGOGASTRODUODENOSCOPY (EGD) WITH PROPOFOL N/A 02/02/2020   Procedure: ESOPHAGOGASTRODUODENOSCOPY (EGD) WITH PROPOFOL;  Surgeon: Arta Silence, MD;  [facility];  Service: Endoscopy;  Laterality: N/A;   ICD IMPLANT  10/14/2016   ICD IMPLANT N/A 10/14/2016   Procedure: ICD Implant;  Surgeon: Deboraha Sprang, MD;  Location: [place] CV LAB;  Service: Cardiovascular;  Laterality: N/A;   PRESSURE SENSOR/CARDIOMEMS N/A 09/10/2020   Procedure: PRESSURE SENSOR/CARDIOMEMS;  Surgeon: Larey Dresser, MD;  Location: [place] CV LAB;  Service: Cardiovascular;  Laterality: N/A;   RIGHT HEART CATH N/A 05/21/2020   Procedure: RIGHT HEART CATH;  Surgeon: Larey Dresser, MD;  Location: [place] CV LAB;  Service: Cardiovascular;  Laterality: N/A;   WRIST SURGERY      Assessment / Plan / Recommendation Clinical Impression  Troy Rush is a 50 year old man with PMH of HLD, systolic CHF, HTN, atrial fibrillation,  nonischemic cardiomyopathy with ICD, DM2, deafness and mutism, hx of DVT/PE on Eliquis, depression, CKD 4, OSA, who presented to the ED with dizziness and right sided numbness and was found to have a left subcortical infarct due to small vessel disease as per neurology. CT Head shows no abnormality and MRI was unobtainable due to ICD placement. Not a tPA candidate given anticoagulation with Eliquis. Admitted to CIR on 6/25 with impaired mobility and ADLs secondary to his stroke.  SLP evaluation completed on 10/04/20 with results as follows:  Pt presents with grossly intact cognitive-linguistic functioning.  He uses ASL as his primary mode of communication and he signed fluently without any evidence of word finding difficulty or comprehension impairment while using video interpreter.  Pt demonstrates appropriate recall of daily information, attention to tasks, problem solving, and safety awareness.  Pt and nurse also deny any swallowing difficulty, so bedside swallow evaluation was deferred at this time.  In the absence of communication, cognitive, or swallowing impairment, no further ST needs are identify at this time but SLP did educate pt on the availability of OP ST services should he experience difficulty resuming ADLs in the home environment.  Pt in agreement with recommended plan of care and all questions were answered to pt's satisfaction at this time.    Skilled Therapeutic Interventions          Cognitive-linguistic completed with results and recommendations reviewed with patient.   SLP Assessment  Patient does not need any further Speech Lanaguage Pathology Services    Recommendations  Follow up Recommendations: None  Equipment Recommended: None recommended by SLP                     Pain Pain Assessment Pain Scale: 0-10 Pain Score: 0-No pain  Prior Functioning Cognitive/Linguistic Baseline: Within functional limits Type of Home: Apartment  Lives With: Alone Available Help at  Discharge: Family;Friend(s);Available PRN/intermittently  SLP Evaluation Cognition Overall Cognitive Status: Within Functional Limits for tasks assessed  Comprehension Auditory Comprehension Overall Auditory Comprehension: Appears within functional limits for tasks assessed Expression Expression Primary Mode of Expression: Other (comment) (ASL, WFL) Oral Motor Oral Motor/Sensory Function Overall Oral Motor/Sensory Function: Within functional limits Motor Speech Overall Motor Speech: Other (comment) (n/a)  Care Tool Care Tool Cognition Expression of Ideas and Wants Expression of Ideas and Wants: Without difficulty (complex and basic) - expresses complex messages without difficulty and with speech that is clear and easy to understand   Understanding Verbal and Non-Verbal Content Understanding Verbal and Non-Verbal Content: Understands (complex and basic) - clear comprehension without cues or repetitions   Memory/Recall Ability *first 3 days only Memory/Recall Ability *first 3 days only: Current season;Location of own room;Staff names and faces;That he or she is in a hospital/hospital unit    Refer to Care Plan for Long Term Goals  Recommendations for other services: None   Discharge Criteria: Patient will be discharged from SLP if patient refuses treatment 3 consecutive times without medical reason, if treatment goals not met, if there is a change in medical status, if patient makes no progress towards goals or if patient is discharged from hospital.  The above assessment, treatment plan, treatment alternatives and goals were discussed and mutually agreed upon: by patient  Troy Rush 10/04/2020, 10:51 AM

## 2020-10-04 NOTE — Progress Notes (Signed)
Inpatient Rehabilitation Medication Review by a Pharmacist  A complete drug regimen review was completed for this patient to identify any potential clinically significant medication issues.  Clinically significant medication issues were identified:  no  Check AMION for pharmacist assigned to patient if future medication questions/issues arise during this admission.  Pharmacist comments: Wilder Glade appropriately held due to eGFR<45. Sucralfate listed on discharge med list; however med was never resumed during last admission; appropriate to stop.   Time spent performing this drug regimen review (minutes):  10 minutes  Berenice Bouton, PharmD, MS, BCPS PGY2 Pharmacy Resident  Please check AMION for all [place] phone numbers After 10:00 PM, call [place] [phone]  10/04/2020 10:31 AM

## 2020-10-04 NOTE — Evaluation (Signed)
Physical Therapy Assessment and Plan  Patient Details  Name: Troy Rush MRN: [id number] Date of Birth: 18-Oct-1970  PT Diagnosis: Abnormality of gait, Difficulty walking, Edema, and Muscle weakness Rehab Potential: Good ELOS: 7-10 days   Today's Date: 10/04/2020 PT Individual Time: 1100-1200 PT Individual Time Calculation (min): 60 min    Hospital Problem: Principal Problem:   Subcortical infarction [facility])   Past Medical History:  Past Medical History:  Diagnosis Date   AICD (automatic cardioverter/defibrillator) present 10/14/2016   Bilateral deafness    Bronchitis    CHF (congestive heart failure) ([place])    Depression    Diabetes mellitus without complication ([place])    DVT, lower extremity ([place]) 07/12/2019   Dyslipidemia    Headache 08/2019   Hypertension    ICD (implantable cardioverter-defibrillator) in place    Insomnia 08/2019   NICM (nonischemic cardiomyopathy) ([place]) 09/15/2016   Pancreatitis    Sleep apnea    Vitamin D deficiency 08/2019   Past Surgical History:  Past Surgical History:  Procedure Laterality Date   BACK SURGERY     CARDIAC CATHETERIZATION     ESOPHAGOGASTRODUODENOSCOPY (EGD) WITH PROPOFOL N/A 02/02/2020   Procedure: ESOPHAGOGASTRODUODENOSCOPY (EGD) WITH PROPOFOL;  Surgeon: Arta Silence, MD;  Location: [facility] ENDOSCOPY;  Service: Endoscopy;  Laterality: N/A;   ICD IMPLANT  10/14/2016   ICD IMPLANT N/A 10/14/2016   Procedure: ICD Implant;  Surgeon: Deboraha Sprang, MD;  Location: [place] CV LAB;  Service: Cardiovascular;  Laterality: N/A;   PRESSURE SENSOR/CARDIOMEMS N/A 09/10/2020   Procedure: PRESSURE SENSOR/CARDIOMEMS;  Surgeon: Larey Dresser, MD;  Location: [place] CV LAB;  Service: Cardiovascular;  Laterality: N/A;   RIGHT HEART CATH N/A 05/21/2020   Procedure: RIGHT HEART CATH;  Surgeon: Larey Dresser, MD;  Location: [place] CV LAB;  Service: Cardiovascular;  Laterality: N/A;   WRIST SURGERY      Assessment & Plan Clinical  Impression: Patient is a 50 y.o. year old male with PMH of HLD, systolic CHF, HTN, atrial fibrillation, nonischemic cardiomyopathy with ICD, DM2, deafness and mutism, hx of DVT/PE on Eliquis, depression, CKD 4, OSA, who presented to the ED with dizziness and right sided numbness and was found to have a left subcortical infarct due to small vessel disease as per neurology. CT Head shows no abnormality and MRI was unobtainable due to ICD placement. Not a tPA candidate given anticoagulation with Eliquis. Admitted to CIR on 6/25 with impaired mobility and ADLs secondary to his stroke.  Patient transferred to CIR on 10/03/2020 .   Patient currently requires min with mobility secondary to muscle weakness, decreased cardiorespiratoy endurance, abnormal tone, decreased visual acuity, and decreased sitting balance, decreased standing balance, decreased postural control, hemiplegia, and decreased balance strategies.  Prior to hospitalization, patient was independent  with mobility and lived with Alone in a [place] home.  Home access is 2Stairs to enter.  Patient will benefit from skilled PT intervention to maximize safe functional mobility, minimize fall risk, and decrease caregiver burden for planned discharge home with intermittent assist.  Anticipate patient will benefit from follow up OP at discharge.  PT - End of Session Activity Tolerance: Tolerates 30+ min activity with multiple rests Endurance Deficit: Yes Endurance Deficit Description: mild SOB with ambulation and stairs PT Assessment Rehab Potential (ACUTE/IP ONLY): Good PT Barriers to Discharge: Lack of/limited family support;Inaccessible home environment PT Patient demonstrates impairments in the following area(s): Balance;Behavior;Edema;Endurance;Motor;Nutrition;Pain;Safety;Sensory;Skin Integrity;Perception PT Transfers Functional Problem(s): Bed Mobility;Bed to Chair;Car;Furniture PT Locomotion Functional Problem(s):  Ambulation;Wheelchair  Mobility;Stairs PT Plan PT Intensity: Minimum of 1-2 x/day ,45 to 90 minutes PT Frequency: 5 out of 7 days PT Duration Estimated Length of Stay: 7-10 days PT Treatment/Interventions: Ambulation/gait training;Discharge planning;DME/adaptive equipment instruction;Functional mobility training;Pain management;Psychosocial support;Splinting/orthotics;Therapeutic Activities;UE/LE Strength taining/ROM;Visual/perceptual remediation/compensation;Wheelchair propulsion/positioning;UE/LE Coordination activities;Therapeutic Exercise;Stair training;Skin care/wound management;Patient/family education;Neuromuscular re-education;Functional electrical stimulation;Disease management/prevention;Community reintegration;Balance/vestibular training PT Transfers Anticipated Outcome(s): Mod I using LRAD PT Locomotion Anticipated Outcome(s): Mod I household, supervision community level using LRAD PT Recommendation Recommendations for Other Services: Neuropsych consult;Therapeutic Recreation consult Therapeutic Recreation Interventions: Stress management Follow Up Recommendations: Outpatient PT Patient destination: Home Equipment Recommended: To be determined Equipment Details: may need RW or SPC depending on patient's progress   PT Evaluation Precautions/Restrictions Precautions Precautions: Fall;Other (comment) Precaution Comments: watch BP, requires ASL interpretor Restrictions Weight Bearing Restrictions: No Home Living/Prior Functioning Home Living Available Help at Discharge: Family;Friend(s);Available PRN/intermittently (his daughter may be moving to help him) Type of Home: Apartment Home Access: Stairs to enter [employer] of Steps: 2 Entrance Stairs-Rails: Right;Left;Can reach both Home Layout: One level Bathroom Shower/Tub: (P) Tub/shower unit Bathroom Toilet: Standard Bathroom Accessibility: (P) Yes  Lives With: Alone Prior Function Level of Independence: Independent with basic  ADLs;Independent with gait;Independent with homemaking with ambulation;Independent with transfers  Able to Take Stairs?: Reciprically Driving: No (uses uber or takes the bus) 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 [employer] Alignment: Within Functional Limits Alignment/Gaze Preference: Within Defined Limits Tracking/Visual Pursuits: Able to track stimulus in all quads without difficulty Saccades: Within functional limits Perception Perception: Within Functional Limits Praxis Praxis: Intact  Cognition Overall Cognitive Status: Within Functional Limits for tasks assessed Orientation Level: Oriented X4 Memory: Appears intact Awareness: Appears intact Problem Solving: Appears intact Safety/Judgment: Appears intact Sensation Sensation Light Touch: Appears Intact Proprioception: Appears Intact Coordination Gross Motor Movements are Fluid and Coordinated: No Fine Motor Movements are Fluid and Coordinated: Yes Coordination and Movement Description: mild R hemi and decreased postural control Heel Shin Test: [facility] bilaterally Motor  Motor Motor: Hemiplegia;Abnormal postural alignment and control   Trunk/Postural Assessment  Cervical Assessment Cervical Assessment: Within Functional Limits Thoracic Assessment Thoracic Assessment: Within Functional Limits Lumbar Assessment Lumbar Assessment: Within Functional Limits Postural Control Postural Control: Deficits on evaluation (decreased/delayed)  Balance Balance Balance Assessed: Yes Static Sitting Balance Static Sitting - Balance Support: Feet unsupported;No upper extremity supported Static Sitting - Level of Assistance: 7: Independent Dynamic Sitting Balance Dynamic Sitting - Balance Support: During functional activity;Feet supported Dynamic Sitting - Level of Assistance: 5: Stand by assistance Static Standing Balance Static Standing - Balance Support: No upper  extremity supported;During functional activity Static Standing - Level of Assistance: 5: Stand by assistance Dynamic Standing Balance Dynamic Standing - Balance Support: No upper extremity supported;During functional activity Dynamic Standing - Level of Assistance: 4: Min assist Extremity Assessment  RLE Assessment RLE Assessment: Exceptions to [facility] Active Range of Motion (AROM) Comments: WFL for all functional mobility General Strength Comments: Grossly 4 to 4+/5 throughout LLE Assessment LLE Assessment: Within Functional Limits Active Range of Motion (AROM) Comments: WFL for all functional mobility General Strength Comments: Grossly 5/5 throughout  Care Tool Care Tool Bed Mobility Roll left and right activity   Roll left and right assist level: Independent    Sit to lying activity   Sit to lying assist level: Supervision/Verbal cueing    Lying to sitting edge of bed activity   Lying to sitting edge of bed assist level: Supervision/Verbal cueing  Care Tool Transfers Sit to stand transfer   Sit to stand assist level: Supervision/Verbal cueing    Chair/bed transfer   Chair/bed transfer assist level: Supervision/Verbal cueing     Toilet transfer   Assist Level: Contact Guard/Touching assist    Car transfer   Car transfer assist level: Contact Guard/Touching assist      Care Tool Locomotion Ambulation   Assist level: Minimal Assistance - Patient > 75% Assistive device: Hand held assist Max distance: 220 ft  Walk 10 feet activity   Assist level: Minimal Assistance - Patient > 75% Assistive device: Hand held assist   Walk 50 feet with 2 turns activity   Assist level: Minimal Assistance - Patient > 75% Assistive device: Hand held assist  Walk 150 feet activity   Assist level: Minimal Assistance - Patient > 75% Assistive device: Hand held assist  Walk 10 feet on uneven surfaces activity   Assist level: Minimal Assistance - Patient > 75% Assistive device: Hand held  assist  Stairs   Assist level: Contact Guard/Touching assist Stairs assistive device: 2 hand rails Max number of stairs: 12  Walk up/down 1 step activity   Walk up/down 1 step (curb) assist level: Contact Guard/Touching assist Walk up/down 1 step or curb assistive device: 2 hand rails    Walk up/down 4 steps activity Walk up/down 4 steps assist level: Contact Guard/Touching assist Walk up/down 4 steps assistive device: 2 hand rails  Walk up/down 12 steps activity   Walk up/down 12 steps assist level: Contact Guard/Touching assist Walk up/down 12 steps assistive device: 2 hand rails  Pick up small objects from floor   Pick up small object from the floor assist level: Minimal Assistance - Patient > 75% Pick up small object from the floor assistive device: none  Wheelchair Will patient use wheelchair at discharge?: No Type of Wheelchair: Manual   Wheelchair assist level: Supervision/Verbal cueing Max wheelchair distance: >300 ft  Wheel 50 feet with 2 turns activity   Assist Level: Supervision/Verbal cueing  Wheel 150 feet activity   Assist Level: Supervision/Verbal cueing    Refer to Care Plan for Long Term Goals  SHORT TERM GOAL WEEK 1 PT Short Term Goal 1 (Week 1): STG=LTG due to ELOS.  Recommendations for other services: Neuropsych and Therapeutic Recreation  Stress management  Skilled Therapeutic Intervention Evaluation completed (see details above and below) with education on PT POC and goals and individual treatment initiated with focus on functional mobility/transfers, LE strength, dynamic standing balance/coordination, ambulation, stair navigation, simulated car transfers, and improved endurance with activity Patient provided with 18"x18" wheelchair. Patient also provided with RW for use in room and therapist adjusted to proper height for patient.  Utilized [employer] interpretation system for ASL throughout session.   Patient in recliner in the room upon PT arrival.  Patient alert and agreeable to PT session. Patient denied pain during session.  Therapeutic Activity: Bed Mobility: Patient performed rolling R/L independently and supine to/from sit with supervision-mod I for increased time.  Transfers: Patient performed sit to/from stand x8 with supervision and stand pivot x2 with CGA, as patient reaches out for objects to stabilize himself while turning. Provided verbal cues for forward weight shift for improved COM over COG. Patient performed a simulated sedan height car transfer with CGA for safety/balance using car door for balance. Provided cues for safe technique following transfer.  Gait Training:  Patient ambulated 220 feet and 134 feet using L HHA with min A, attempted without HHA both trials,  first trial patient reaching out for objects to steady himself, second trial no longer reaching and able to progress to arm swing with cues prior to trial. Ambulated with decreased gait speed, decreased step length and height, increased B hip and knee flexion in stance R>L, decreased R weight shift and stance time, and mild forward trunk lean.   Wheelchair Mobility:  Patient propelled wheelchair 50, 300, and 160 feet with supervision. Provided verbal cues for propulsion technique, turns, and w/c parts management. Utilized w/c for endurance and due to equipment demands with interpretation system between floors.   Instructed pt in results of PT evaluation as detailed above, PT POC, rehab potential, rehab goals, and discharge recommendations. Additionally discussed CIR's policies regarding fall safety and use of chair alarm and/or quick release belt. Pt verbalized understanding and in agreement. Will update pt's family members as they become available.   Patient in recliner in the room at end of session with breaks locked, chair alarm set, and all needs within reach.   Discharge Criteria: Patient will be discharged from PT if patient refuses treatment 3 consecutive  times without medical reason, if treatment goals not met, if there is a change in medical status, if patient makes no progress towards goals or if patient is discharged from hospital.  The above assessment, treatment plan, treatment alternatives and goals were discussed and mutually agreed upon: by patient  Doreene Burke PT, DPT  10/04/2020, 12:33 PM

## 2020-10-04 NOTE — Progress Notes (Signed)
PROGRESS NOTE   Subjective/Complaints: BP elevated this morning- received imdur shortly after Lives alone- daughter is supposed to move in with him C/o constipation- miralax ordered Sleeping well  ROS: +constipation   Objective:   No results found. No results for input(s): WBC, HGB, HCT, PLT in the last 72 hours. No results for input(s): NA, K, CL, CO2, GLUCOSE, BUN, CREATININE, CALCIUM in the last 72 hours.  Intake/Output Summary (Last 24 hours) at 10/04/2020 1021 Last data filed at 10/04/2020 0617 Gross per 24 hour  Intake 740 ml  Output --  Net 740 ml        Physical Exam: Vital Signs Blood pressure (!) 164/89, pulse 72, temperature 98.3 F (36.8 C), temperature source Oral, resp. rate 20, weight 101.8 kg, SpO2 98 %. Gen: no distress, normal appearing HEENT: oral mucosa pink and moist, NCAT Cardio: Reg rate Chest: normal effort, normal rate of breathing Abd: soft, non-distended Ext: no edema Psych: pleasant, normal affect, smiling and cheerful Skin: intact Neuro: Alert and oriented x3. Mute and deaf (congenital). Able to communicate via ASL very well. Strength intact except for 4/5 right sided hand grip, HF, and DF. Decreased sensation throughout right side.    Assessment/Plan: 1. Functional deficits which require 3+ hours per day of interdisciplinary therapy in a comprehensive inpatient rehab setting. Physiatrist is providing close team supervision and 24 hour management of active medical problems listed below. Physiatrist and rehab team continue to assess barriers to discharge/monitor patient progress toward functional and medical goals  Care Tool:  Bathing              Bathing assist       Upper Body Dressing/Undressing Upper body dressing        Upper body assist      Lower Body Dressing/Undressing Lower body dressing            Lower body assist       Toileting Toileting     Toileting assist Assist for toileting: Contact Guard/Touching assist     Transfers Chair/bed transfer  Transfers assist           Locomotion Ambulation   Ambulation assist              Walk 10 feet activity   Assist           Walk 50 feet activity   Assist           Walk 150 feet activity   Assist           Walk 10 feet on uneven surface  activity   Assist           Wheelchair     Assist               Wheelchair 50 feet with 2 turns activity    Assist            Wheelchair 150 feet activity     Assist          Blood pressure (!) 164/89, pulse 72, temperature 98.3 F (36.8 C), temperature source Oral, resp. rate 20, weight 101.8 kg, SpO2 98 %.     Medical  Problem List and Plan: 1.  Impaired mobility and ADLs secondary to left subcortical infarct due to small vessel disease             -patient may shower             -ELOS/Goals: 5-7 days modI  -Initial CIR evals today 2.  Atrial fibrillation: continue Eliquis and amiodarone 3. Pain Management: N/A 4. Depression: continue zoloft. 5. Neuropsych: This patient is capable of making decisions on his own behalf. 6. HTN: continue Coreg, Lasix, Hydralazine, uncontrolled, increase Hydralazine to '75mg'$  TID 7. Type 2 DM: HgbA1c 10.3, Continue Lantus. Cannot resume Farxiga since GFR 29. Increase Lantus to 25U. Discussed this with patient.  8. CKD stage 4: continue to monitor Creatinine weekly/ 9. OSA: CPAP at night 10. Hx of unprovoked DVT and PE: continue eliquis. Outpatient heme referral for hypercoag w/u 11. Hyponatremia: improved, monitor Na weekly. 12. Anemia of chronic disease from CKD: referral for outpatient colon cancer screening. 13. GERD: continue pepcid and protonix 14. Prolonged QT: avoid QT prolonging medications. 15. HLD: continue statin, Vascepa, and finofibrate 16. Obesity BMI 32.55: provide lifestyle education 17. Constipation: Miralax daily  started.  LOS: 1 days A FACE TO FACE EVALUATION WAS PERFORMED  Martha Clan P Jency Schnieders 10/04/2020, 10:21 AM

## 2020-10-05 LAB — GLUCOSE, CAPILLARY
Glucose-Capillary: 155 mg/dL — ABNORMAL HIGH (ref 70–99)
Glucose-Capillary: 138 mg/dL — ABNORMAL HIGH (ref 70–99)
Glucose-Capillary: 131 mg/dL — ABNORMAL HIGH (ref 70–99)
Glucose-Capillary: 145 mg/dL — ABNORMAL HIGH (ref 70–99)

## 2020-10-05 NOTE — Progress Notes (Signed)
PROGRESS NOTE   Subjective/Complaints:  Online sign interpreter used Pt without c/os, amb with PT this am no AD.  Working with OT with ADLs   ROS: +constipation   Objective:   No results found. No results for input(s): WBC, HGB, HCT, PLT in the last 72 hours. No results for input(s): NA, K, CL, CO2, GLUCOSE, BUN, CREATININE, CALCIUM in the last 72 hours.  Intake/Output Summary (Last 24 hours) at 10/05/2020 0945 Last data filed at 10/05/2020 0720 Gross per 24 hour  Intake 836 ml  Output --  Net 836 ml         Physical Exam: Vital Signs Blood pressure (!) 157/83, pulse 69, temperature 98.3 F (36.8 C), resp. rate 18, weight 102.4 kg, SpO2 98 %.  General: No acute distress Mood and affect are appropriate Heart: Regular rate and rhythm no rubs murmurs or extra sounds Lungs: Clear to auscultation, breathing unlabored, no rales or wheezes Abdomen: Positive bowel sounds, soft nontender to palpation, nondistended Extremities: No clubbing, cyanosis, or edema Skin: No evidence of breakdown, no evidence of rash  Psych: pleasant, normal affect, smiling and cheerful Skin: intact Neuro: Alert and oriented x3. Mute and deaf (congenital). Able to communicate via ASL very well. Strength intact except for 4/5 right sided hand grip, HF, and DF. Decreased sensation throughout right side.    Assessment/Plan: 1. Functional deficits which require 3+ hours per day of interdisciplinary therapy in a comprehensive inpatient rehab setting. Physiatrist is providing close team supervision and 24 hour management of active medical problems listed below. Physiatrist and rehab team continue to assess barriers to discharge/monitor patient progress toward functional and medical goals  Care Tool:  Bathing    Body parts bathed by patient: Right arm, Right lower leg, Left arm, Left lower leg, Chest, Abdomen, Face, Front perineal area, Right  upper leg, Buttocks, Left upper leg         Bathing assist Assist Level: Contact Guard/Touching assist     Upper Body Dressing/Undressing Upper body dressing   What is the patient wearing?: Pull over shirt    Upper body assist Assist Level: Set up assist    Lower Body Dressing/Undressing Lower body dressing      What is the patient wearing?: Pants     Lower body assist Assist for lower body dressing: Contact Guard/Touching assist     Toileting Toileting    Toileting assist Assist for toileting: Contact Guard/Touching assist     Transfers Chair/bed transfer  Transfers assist     Chair/bed transfer assist level: Contact Guard/Touching assist     Locomotion Ambulation   Ambulation assist      Assist level: Minimal Assistance - Patient > 75% Assistive device: Hand held assist Max distance: 220 ft   Walk 10 feet activity   Assist     Assist level: Minimal Assistance - Patient > 75% Assistive device: Hand held assist   Walk 50 feet activity   Assist    Assist level: Minimal Assistance - Patient > 75% Assistive device: Hand held assist    Walk 150 feet activity   Assist    Assist level: Minimal Assistance - Patient > 75% Assistive device:  Hand held assist    Walk 10 feet on uneven surface  activity   Assist     Assist level: Minimal Assistance - Patient > 75% Assistive device: Hand held assist   Wheelchair     Assist Will patient use wheelchair at discharge?: No Type of Wheelchair: Manual    Wheelchair assist level: Supervision/Verbal cueing Max wheelchair distance: >300 ft    Wheelchair 50 feet with 2 turns activity    Assist        Assist Level: Supervision/Verbal cueing   Wheelchair 150 feet activity     Assist      Assist Level: Supervision/Verbal cueing   Blood pressure (!) 157/83, pulse 69, temperature 98.3 F (36.8 C), resp. rate 18, weight 102.4 kg, SpO2 98 %.     Medical Problem List and  Plan: 1.  Impaired mobility and ADLs secondary to left subcortical infarct due to small vessel disease             -patient may shower             -ELOS/Goals: 5-7 days modI  -team conf 2.  Atrial fibrillation: continue Eliquis and amiodarone 3. Pain Management: N/A 4. Depression: continue zoloft. 5. Neuropsych: This patient is capable of making decisions on his own behalf. 6. HTN: continue Coreg, Lasix, Hydralazine, uncontrolled, increase Hydralazine to '75mg'$  TID Vitals:   10/04/20 1922 10/05/20 0406  BP: 139/74 (!) 157/83  Pulse: 74 69  Resp: 18 18  Temp: 97.6 F (36.4 C) 98.3 F (36.8 C)  SpO2: 97% 98%    7. Type 2 DM: HgbA1c 10.3, Continue Lantus. Cannot resume Farxiga since GFR 29. Increase Lantus to 25U. Discussed this with patient.  CBG (last 3)  Recent Labs    10/04/20 1616 10/04/20 2046 10/05/20 0551  GLUCAP 137* 242* 155*  Some lability am CBG ok  8. CKD stage 4: continue to monitor Creatinine weekly/ 9. OSA: CPAP at night 10. Hx of unprovoked DVT and PE: continue eliquis. Outpatient heme referral for hypercoag w/u 11. Hyponatremia: improved, monitor Na weekly. 12. Anemia of chronic disease from CKD: referral for outpatient colon cancer screening. 13. GERD: continue pepcid and protonix 14. Prolonged QT: avoid QT prolonging medications. 15. HLD: continue statin, Vascepa, and finofibrate 16. Obesity BMI 32.55: provide lifestyle education 17. Constipation: Miralax daily started.  LOS: 2 days A FACE TO FACE EVALUATION WAS PERFORMED  Charlett Blake 10/05/2020, 9:45 AM

## 2020-10-05 NOTE — Progress Notes (Signed)
Inpatient Rehabilitation Care Coordinator Assessment and Plan Patient Details  Name: Troy Rush MRN: [id number] Date of Birth: May 24, 1970  Today's Date: 10/05/2020  Hospital Problems: Principal Problem:   Subcortical infarction [facility])  Past Medical History:  Past Medical History:  Diagnosis Date   AICD (automatic cardioverter/defibrillator) present 10/14/2016   Bilateral deafness    Bronchitis    CHF (congestive heart failure) ([place])    Depression    Diabetes mellitus without complication ([place])    DVT, lower extremity ([place]) 07/12/2019   Dyslipidemia    Headache 08/2019   Hypertension    ICD (implantable cardioverter-defibrillator) in place    Insomnia 08/2019   NICM (nonischemic cardiomyopathy) ([place]) 09/15/2016   Pancreatitis    Sleep apnea    Vitamin D deficiency 08/2019   Past Surgical History:  Past Surgical History:  Procedure Laterality Date   BACK SURGERY     CARDIAC CATHETERIZATION     ESOPHAGOGASTRODUODENOSCOPY (EGD) WITH PROPOFOL N/A 02/02/2020   Procedure: ESOPHAGOGASTRODUODENOSCOPY (EGD) WITH PROPOFOL;  Surgeon: Arta Silence, MD;  Location: [facility] END[facility];  Service: Endoscopy;  Laterality: N/A;   ICD IMPLANT  10/14/2016   ICD IMPLANT N/A 10/14/2016   Procedure: ICD Implant;  Surgeon: Deboraha Sprang, MD;  Location: [facility];  Service: Cardiovascular;  Laterality: N/A;   PRESSURE SENSOR/CARDIOMEMS N/A 09/10/2020   Procedure: PRESSURE SENSOR/CARDIOMEMS;  Surgeon: Larey Dresser, MD;  Location: [facility];  Service: Cardiovascular;  Laterality: N/A;   RIGHT HEART CATH N/A 05/21/2020   Procedure: RIGHT HEART CATH;  Surgeon: Larey Dresser, MD;  Location: [facility];  Service: Cardiovascular;  Laterality: N/A;   WRIST SURGERY     Social History:  reports that he has never smoked. He has never used smokeless tobacco. He reports current alcohol use of about 1.0 - 2.0 standard drink of alcohol per week. He reports that he does not use  drugs.  Family / Support Systems Patient Roles: Spouse Children: Liatu Anticipated Caregiver: daughter plans to move with pt, goals are MOD I  Social History Preferred language: Sign Language Religion: Christian Read: Yes Write: Yes Legal History/Current Legal Issues: n/a Guardian/Conservator: n/a   Abuse/Neglect Abuse/Neglect Assessment Can Be Completed: Yes Physical Abuse: Denies Verbal Abuse: Denies Sexual Abuse: Denies Exploitation of patient/patient's resources: Denies Self-Neglect: Denies  Emotional Status Pt's affect, behavior and adjustment status: pt pleasant Recent Psychosocial Issues: n/a Psychiatric History: n/a Substance Abuse History: n/a  Patient / Family Perceptions, Expectations & Goals Pt/Family understanding of illness & functional limitations: yes at bedside Premorbid pt/family roles/activities: Pt previously independent Anticipated changes in roles/activities/participation: daughter plans to assist with task Pt/family expectations/goals: MOD I  Recruitment consultant: None Premorbid Home Care/DME Agencies: None Transportation available at discharge: family/friend able to transport  Discharge Planning Living Arrangements: Alone Support Systems: Home care staff Type of Residence: Private residence Insurance Resources: Medicare, Medicaid (specify county) Does the patient have any problems obtaining your medications?: No Care Coordinator Anticipated Follow Up Needs: HH/OP Expected length of stay: 7-10 Days  Clinical Impression SW met with pt and family at bedside (Interpreting machine used). SW introduced self, explained role and addressed questions and concerns. SW will cont to follow up.   Dyanne Iha 10/05/2020, 12:44 PM

## 2020-10-05 NOTE — Progress Notes (Signed)
Physical Therapy Session Note  Patient Details  Name: Troy Rush MRN: [id number] Date of Birth: 04/28/1970  Today's Date: 10/05/2020 PT Individual Time: 0802-0826 PT Individual Time Calculation (min): 24 min   Short Term Goals: Week 1:  PT Short Term Goal 1 (Week 1): STG=LTG due to ELOS.  Skilled Therapeutic Interventions/Progress Updates:    Pt sleeping soundly with all lights off on arrival - awakens with gentle touch. No stratus interpreter at bedside and needed to retrieve this from nursing station. Used Stratus interpreter EJ for ASL services throughout session and pt agreeable to PT session. Pleasant and cooperative throughout session. Denies any pain. Supine<>sit completed mod I with HOB nearly flat, use of bedrail. Sit<>stand with CGA and no AD, effortful. Ambulated >200ft with CGA (progressing to supervision) and no AD in hallways. Gait speed decreased with decreased R step length and foot clearance. Cues for increasing arm swing, gait speed, and hip/knee flexors as gait is somewhat rigid. Pt needing to stop ambulating in order to ASL with interpreter, difficulty with dual-cog tasks. Pt able to hold conversation via sign language, describes hobbies and prior life events well. He returned to his room and ended session supine in bed (bed mobility mod I) and remained supine with all needs met. Opened curtains to allow natural sunlight to improve circadian rhythm (pt with history of depression). Bed alarm set.   Therapy Documentation Precautions:  Precautions Precautions: Fall, Other (comment) Precaution Comments: watch BP, requires ASL interpretor Restrictions Weight Bearing Restrictions: No General:    Therapy/Group: Individual Therapy  [name] 10/05/2020, 7:38 AM  

## 2020-10-05 NOTE — Progress Notes (Signed)
Occupational Therapy Session Note  Patient Details  Name: Troy Rush MRN: [id number] Date of Birth: 20-Aug-1970  Today's Date: 10/05/2020 OT Individual Time: 1001-1029 OT Individual Time Calculation (min): 28 min    Short Term Goals: Week 1:  OT Short Term Goal 1 (Week 1): STG=LTG d/t ELOS  Skilled Therapeutic Interventions/Progress Updates:    Pt received in room sitting EOB and consented to OT tx. ASL interpreter used for session via Stratus. Pt requested shower this morning, session focused on morning ADL routine including dressing with clothing retrieval from closet, bathing, grooming, and functional mobility and transfers. Pt walked in room with no AD and CGA for all clothing gathering and transferred into walk in shower with CGA. Pt bathed with SUP cued for thoroughness, walked out to EOB to sit and get dressed with SUP. Pt then walked with CGA and no AD to sink to complete oral care and grooming standing with SUP. After tx, pt left sitting EOB with bed alarm on and all needs met.   Therapy Documentation Precautions:  Precautions Precautions: Fall, Other (comment) Precaution Comments: watch BP, requires ASL interpretor Restrictions Weight Bearing Restrictions: No  Pain: Pain Assessment Pain Scale: 0-10 Pain Score: 0-No pain    Therapy/Group: Individual Therapy  Nela Bascom 10/05/2020, 9:40 AM

## 2020-10-05 NOTE — Progress Notes (Signed)
RT note. Patient outside ov his COVID window and can wear cpap machine without being in a negative pressure room. Asked patient if he would like to wear cpap machine tonight, stated he does not. Cpap is in patients room if he decides to change his mind. RT will continue to monitor

## 2020-10-05 NOTE — Progress Notes (Signed)
Inpatient Rehabilitation Center Individual Statement of Services  Patient Name:  LANCELOT LAPEYROUSE  Date:  10/05/2020  Welcome to the [facility].  Our goal is to provide you with an individualized program based on your diagnosis and situation, designed to meet your specific needs.  With this comprehensive rehabilitation program, you will be expected to participate in at least 3 hours of rehabilitation therapies Monday-Friday, with modified therapy programming on the weekends.  Your rehabilitation program will include the following services:  Physical Therapy (PT), Occupational Therapy (OT), Speech Therapy (ST), 24 hour per day rehabilitation nursing, Therapeutic Recreaction (TR), Neuropsychology, Care Coordinator, Rehabilitation Medicine, Nutrition Services, Pharmacy Services, and Other  Weekly team conferences will be held on Tuesday to discuss your progress.  Your Inpatient Rehabilitation Care Coordinator will talk with you frequently to get your input and to update you on team discussions.  Team conferences with you and your family in attendance may also be held.  Expected length of stay: 7-10 Days  Overall anticipated outcome: MOD I  Depending on your progress and recovery, your program may change. Your Inpatient Rehabilitation Care Coordinator will coordinate services and will keep you informed of any changes. Your Inpatient Rehabilitation Care Coordinator's name and contact numbers are listed  below.  The following services may also be recommended but are not provided by the [place]:   [place] will be made to provide these services after discharge if needed.  Arrangements include referral to agencies that provide these services.  Your insurance has been verified to be:  Medicare A & B Your primary doctor is:  NO PCP  Pertinent information will be shared with your  doctor and your insurance company.  Inpatient Rehabilitation Care Coordinator:  Erlene Quan, [place] or [phone]  Information discussed with and copy given to patient by: Dyanne Iha, 10/05/2020, 11:49 AM

## 2020-10-05 NOTE — Progress Notes (Signed)
Occupational Therapy Session Note  Patient Details  Name: Troy Rush MRN: [id number] Date of Birth: 11-Sep-1970  Today's Date: 10/05/2020 OT Individual Time: 5974-1638 OT Individual Time Calculation (min): 30 min    Short Term Goals: Week 1:  OT Short Term Goal 1 (Week 1): STG=LTG d/t ELOS  Skilled Therapeutic Interventions/Progress Updates:    Pt received seated in recliner with two guests present, agreeable to OT session, reporting no pain. ASL virtual interpreter present throughout session. Pt reported he was lonely and asked for visit from the two women present. Session focus on dynamic balance, problem solving, and functional ambulation/endurance; pt very pleasant and motivated to participate throughout session. Sit<>stand close spvsn no AD. Pt engaged in dynamic standing task standing at sink on balance pad (grading distance throughout) requiring reaching with BUEs (one at a time) to complete tic-tac-toe activity on mirror with dry erase marker. Pt encouraged to use R hand for writing tasks and demoed good [facility] when writing. Pt completed 15 minutes of standing balance activity with occasional CGA for balance, but demoed no overt LOB. Pt demoed good problem-solving skills when playing tic-tac-toe over multiple rounds. Pt walked with no AD ~400' safely navigating hallways with close spvsn. Pt provided with psychosocial encouragement when able to see coworkers and previous acute care OT in halls. Pt remained seated in recliner, guests present, alarm set, and all needs met.  Therapy Documentation Precautions:  Precautions Precautions: Fall, Other (comment) Precaution Comments: watch BP, requires ASL interpretor Restrictions Weight Bearing Restrictions: No  Pain: Pain Assessment Pain Scale: 0-10 Pain Score: 0-No pain  Therapy/Group: Individual Therapy  Mellissa Kohut 10/05/2020, 3:51 PM

## 2020-10-05 NOTE — Progress Notes (Signed)
Physical Therapy Session Note  Patient Details  Name: Troy Rush MRN: [id number] Date of Birth: Aug 04, 1970  Today's Date: 10/05/2020 PT Individual Time: 1100-1200 PT Individual Time Calculation (min): 60 min   Short Term Goals: Week 1:  PT Short Term Goal 1 (Week 1): STG=LTG due to ELOS.  Skilled Therapeutic Interventions/Progress Updates:    Pt received seated in bed, agreeable to PT session. No complaints of pain. Bed mobility mod I with use of bedrail. Sit to stand with close Supervision to CGA throughout session. Ambulation 2 x 300 ft with no AD (initially with "furniture-walking" intermittently holding onto rail in hallway, progresses to no AD). Pt exhibits slight ataxia with gait and onset of decreased gait speed with onset of fatigue. Patient demonstrates increased fall risk as noted by score of 32/56 on Berg Balance Scale.  (<36= high risk for falls, close to 100%; 37-45 significant >80%; 46-51 moderate >50%; 52-55 lower >25%). Reviewed score and functional implications. Standing alt L/R cone taps x 10 reps with min A for balance, decreased coordination and control of RLE as compared to LLE. Standing squats 2 x 10 reps with CGA for balance for BLE strengthening. Pt left seated in recliner in room with needs in reach, chair alarm in place, family present at end of session. Utilized Production manager during session for ASL.  Therapy Documentation Precautions:  Precautions Precautions: Fall, Other (comment) Precaution Comments: watch BP, requires ASL interpretor Restrictions Weight Bearing Restrictions: No Balance: Standardized Balance Assessment Standardized Balance Assessment: Berg Balance Test Berg Balance Test Sit to Stand: Able to stand without using hands and stabilize independently Standing Unsupported: Able to stand 2 minutes with supervision Sitting with Back Unsupported but Feet Supported on Floor or Stool: Able to sit safely and securely 2 minutes Stand to Sit:  Sits safely with minimal use of hands Transfers: Able to transfer safely, minor use of hands Standing Unsupported with Eyes Closed: Able to stand 10 seconds with supervision Standing Ubsupported with Feet Together: Able to place feet together independently and stand for 1 minute with supervision From Standing, Reach Forward with Outstretched Arm: Reaches forward but needs supervision From Standing Position, Pick up Object from Floor: Unable to pick up and needs supervision From Standing Position, Turn to Look Behind Over each Shoulder: Needs supervision when turning Turn 360 Degrees: Needs close supervision or verbal cueing Standing Unsupported, Alternately Place Feet on Step/Stool: Able to complete >2 steps/needs minimal assist Standing Unsupported, One Foot in Front: Needs help to step but can hold 15 seconds Standing on One Leg: Tries to lift leg/unable to hold 3 seconds but remains standing independently Total Score: 32     Therapy/Group: Individual Therapy   Excell Seltzer, PT, DPT, CSRS  10/05/2020, 12:12 PM

## 2020-10-05 NOTE — Progress Notes (Signed)
Occupational Therapy Session Note  Patient Details  Name: Troy Rush MRN: [id number] Date of Birth: Jun 20, 1970  Today's Date: 10/05/2020 OT Individual Time: [id number] OT Individual Time Calculation (min): 42 min    Short Term Goals: Week 1:  OT Short Term Goal 1 (Week 1): STG=LTG d/t ELOS  Skilled Therapeutic Interventions/Progress Updates:    Patient seated in recliner.  Alert and aware of needs.  Utilized pen and paper, video interpreter during session.   He denies pain and states that he is doing well.  Sit to stand and ambulation on unit >500 feet with CS.   Completed dynamic balance and visual motor activities without difficulty or LOB.  He notes mild fatigue and occ SOB with this task.  Bilateral upper body coordination for activities WFL.  He has good recall of environment and employees on rehab unit.  Returned to recliner at close of session.  Call bell and tray table in reach - he denies further needs at this time.    Therapy Documentation Precautions:  Precautions Precautions: Fall, Other (comment) Precaution Comments: watch BP, requires ASL interpretor Restrictions Weight Bearing Restrictions: No   Therapy/Group: Individual Therapy  Carlos Levering 10/05/2020, 7:46 AM

## 2020-10-05 NOTE — Discharge Instructions (Addendum)
Inpatient Rehab Discharge Instructions  Troy Rush Discharge date and time: No discharge date for patient encounter.   Activities/Precautions/ Functional Status: Activity: activity as tolerated Diet: diabetic diet Wound Care: Routine skin checks Functional status:  ___ No restrictions     ___ Walk up steps independently ___ 24/7 supervision/assistance   ___ Walk up steps with assistance ___ Intermittent supervision/assistance  ___ Bathe/dress independently ___ Walk with walker     _x__ Bathe/dress with assistance ___ Walk Independently    ___ Shower independently ___ Walk with assistance    ___ Shower with assistance ___ No alcohol     ___ Return to work/school ________  Special Instructions:  No driving smoking or alcoholSTROKE/TIA DISCHARGE INSTRUCTIONS SMOKING Cigarette smoking nearly doubles your risk of having a stroke & is the single most alterable risk factor  If you smoke or have smoked in the last 12 months, you are advised to quit smoking for your health. Most of the excess cardiovascular risk related to smoking disappears within a year of stopping. Ask you doctor about anti-smoking medications [place] Quit Line: 1-800-QUIT NOW Free Smoking Cessation Classes [phone]  CHOLESTEROL Know your levels; limit fat & cholesterol in your diet  Lipid Panel     Component Value Date/Time   CHOL 355 (H) 10/01/2020 0235   CHOL 480 (H) 12/09/2019 0830   TRIG 471 (H) 10/01/2020 0235   HDL 46 10/01/2020 0235   HDL 32 (L) 12/09/2019 0830   CHOLHDL 7.7 10/01/2020 0235   VLDL UNABLE TO CALCULATE IF TRIGLYCERIDE OVER 400 mg/dL 10/01/2020 0235   LDLCALC UNABLE TO CALCULATE IF TRIGLYCERIDE OVER 400 mg/dL 10/01/2020 0235   LDLCALC Comment (A) 12/09/2019 0830     Many patients benefit from treatment even if their cholesterol is at goal. Goal: Total Cholesterol (CHOL) less than 160 Goal:  Triglycerides (TRIG) less than 150 Goal:  HDL greater than 40 Goal:  LDL (LDLCALC) less than 100    BLOOD PRESSURE American Stroke Association blood pressure target is less that 120/80 mm/Hg  Your discharge blood pressure is:  BP: (!) 157/83 Monitor your blood pressure Limit your salt and alcohol intake Many individuals will require more than one medication for high blood pressure  DIABETES (A1c is a blood sugar average for last 3 months) Goal HGBA1c is under 7% (HBGA1c is blood sugar average for last 3 months)  Diabetes:   Lab Results  Component Value Date   HGBA1C 9.7 (H) 09/30/2020    Your HGBA1c can be lowered with medications, healthy diet, and exercise. Check your blood sugar as directed by your physician Call your physician if you experience unexplained or low blood sugars.  PHYSICAL ACTIVITY/REHABILITATION Goal is 30 minutes at least 4 days per week  Activity: Increase activity slowly, Therapies: Physical Therapy: Home Health Return to work:  Activity decreases your risk of heart attack and stroke and makes your heart stronger.  It helps control your weight and blood pressure; helps you relax and can improve your mood. Participate in a regular exercise program. Talk with your doctor about the best form of exercise for you (dancing, walking, swimming, cycling).  DIET/WEIGHT Goal is to maintain a healthy weight  Your discharge diet is:  Diet Order             Diet heart healthy/carb modified Room service appropriate? Yes; Fluid consistency: Thin  Diet effective now                   liquids  Your height is:    Your current weight is: Weight: 102.4 kg Your Body Mass Index (BMI) is:  BMI (Calculated): 32.39 Following the type of diet specifically designed for you will help prevent another stroke. Your goal weight range is:   Your goal Body Mass Index (BMI) is 19-24. Healthy food habits can help reduce 3 risk factors for stroke:  High cholesterol, hypertension, and excess weight.  RESOURCES Stroke/Support Group:  Call [phone]   STROKE EDUCATION PROVIDED/REVIEWED  AND GIVEN TO PATIENT Stroke warning signs and symptoms How to activate emergency medical system (call 911). Medications prescribed at discharge. Need for follow-up after discharge. Personal risk factors for stroke. Pneumonia vaccine given: No Flu vaccine given: No My questions have been answered, the writing is legible, and I understand these instructions.  I will adhere to these goals & educational materials that have been provided to me after my discharge from the hospital.      My questions have been answered and I understand these instructions. I will adhere to these goals and the provided educational materials after my discharge from the hospital.  Patient/Caregiver Signature _______________________________ Date __________  Clinician Signature _______________________________________ Date __________  Please bring this form and your medication list with you to all your follow-up doctor's appointments.  Inpatient Rehab Discharge Instructions  Troy Rush Discharge date and time: No discharge date for patient encounter.   Activities/Precautions/ Functional Status: Activity: activity as tolerated Diet: diabetic diet Wound Care: Routine skin checks Functional status:  ___ No restrictions     ___ Walk up steps independently ___ 24/7 supervision/assistance   ___ Walk up steps with assistance ___ Intermittent supervision/assistance  ___ Bathe/dress independently ___ Walk with walker     __x_ Bathe/dress with assistance ___ Walk Independently    ___ Shower independently ___ Walk with assistance    ___ Shower with assistance ___ No alcohol     ___ Return to work/school ________  COMMUNITY REFERRALS UPON DISCHARGE:     Medical Equipment/Items Ordered:Bedside Commode and Tub Producer, television/film/video                                                 Agency/Supplier:[employer]   Special Instructions: No driving smoking or alcoholSTROKE/TIA DISCHARGE INSTRUCTIONS SMOKING Cigarette  smoking nearly doubles your risk of having a stroke & is the single most alterable risk factor  If you smoke or have smoked in the last 12 months, you are advised to quit smoking for your health. Most of the excess cardiovascular risk related to smoking disappears within a year of stopping. Ask you doctor about anti-smoking medications [place] Quit Line: 1-800-QUIT NOW Free Smoking Cessation Classes [phone]  CHOLESTEROL Know your levels; limit fat & cholesterol in your diet  Lipid Panel     Component Value Date/Time   CHOL 355 (H) 10/01/2020 0235   CHOL 480 (H) 12/09/2019 0830   TRIG 471 (H) 10/01/2020 0235   HDL 46 10/01/2020 0235   HDL 32 (L) 12/09/2019 0830   CHOLHDL 7.7 10/01/2020 0235   VLDL UNABLE TO CALCULATE IF TRIGLYCERIDE OVER 400 mg/dL 10/01/2020 0235   LDLCALC UNABLE TO CALCULATE IF TRIGLYCERIDE OVER 400 mg/dL 10/01/2020 0235   LDLCALC Comment (A) 12/09/2019 0830     Many patients benefit from treatment even if their cholesterol is at goal. Goal: Total Cholesterol (CHOL) less  than 160 Goal:  Triglycerides (TRIG) less than 150 Goal:  HDL greater than 40 Goal:  LDL (LDLCALC) less than 100   BLOOD PRESSURE American Stroke Association blood pressure target is less that 120/80 mm/Hg  Your discharge blood pressure is:  BP: (!) 167/82 Monitor your blood pressure Limit your salt and alcohol intake Many individuals will require more than one medication for high blood pressure  DIABETES (A1c is a blood sugar average for last 3 months) Goal HGBA1c is under 7% (HBGA1c is blood sugar average for last 3 months)  Diabetes:   Lab Results  Component Value Date   HGBA1C 9.7 (H) 09/30/2020    Your HGBA1c can be lowered with medications, healthy diet, and exercise. Check your blood sugar as directed by your physician Call your physician if you experience unexplained or low blood sugars.  PHYSICAL ACTIVITY/REHABILITATION Goal is 30 minutes at least 4 days per week  Activity:  Increase activity slowly, Therapies: Physical Therapy: Home Health Return to work:  Activity decreases your risk of heart attack and stroke and makes your heart stronger.  It helps control your weight and blood pressure; helps you relax and can improve your mood. Participate in a regular exercise program. Talk with your doctor about the best form of exercise for you (dancing, walking, swimming, cycling).  DIET/WEIGHT Goal is to maintain a healthy weight  Your discharge diet is:  Diet Order             Diet heart healthy/carb modified Room service appropriate? Yes; Fluid consistency: Thin  Diet effective now                   liquids Your height is:    Your current weight is: Weight: 105.1 kg Your Body Mass Index (BMI) is:  BMI (Calculated): 33.25 Following the type of diet specifically designed for you will help prevent another stroke. Your goal weight range is:   Your goal Body Mass Index (BMI) is 19-24. Healthy food habits can help reduce 3 risk factors for stroke:  High cholesterol, hypertension, and excess weight.  RESOURCES Stroke/Support Group:  Call [phone]   STROKE EDUCATION PROVIDED/REVIEWED AND GIVEN TO PATIENT Stroke warning signs and symptoms How to activate emergency medical system (call 911). Medications prescribed at discharge. Need for follow-up after discharge. Personal risk factors for stroke. Pneumonia vaccine given: No Flu vaccine given: No My questions have been answered, the writing is legible, and I understand these instructions.  I will adhere to these goals & educational materials that have been provided to me after my discharge from the hospital.       My questions have been answered and I understand these instructions. I will adhere to these goals and the provided educational materials after my discharge from the hospital.  Patient/Caregiver Signature _______________________________ Date __________  Clinician Signature  _______________________________________ Date __________  Please bring this form and your medication list with you to all your follow-up doctor's appointments.

## 2020-10-05 NOTE — Progress Notes (Signed)
Inpatient Rehabilitation  Patient information reviewed and entered into eRehab system by Vieva Brummitt Zuriel Yeaman, OTR/L.   Information including medical coding, functional ability and quality indicators will be reviewed and updated through discharge.    

## 2020-10-06 LAB — GLUCOSE, CAPILLARY
Glucose-Capillary: 71 mg/dL (ref 70–99)
Glucose-Capillary: 106 mg/dL — ABNORMAL HIGH (ref 70–99)
Glucose-Capillary: 137 mg/dL — ABNORMAL HIGH (ref 70–99)
Glucose-Capillary: 230 mg/dL — ABNORMAL HIGH (ref 70–99)

## 2020-10-06 MED ORDER — EXERCISE FOR HEART AND HEALTH BOOK
Freq: Once | Status: AC
  Filled 2020-10-06: qty 1

## 2020-10-06 MED ORDER — BLOOD PRESSURE CONTROL BOOK
Freq: Once | Status: AC
  Filled 2020-10-06: qty 1

## 2020-10-06 NOTE — Progress Notes (Signed)
Patient ID: Troy Rush, male   DOB: Aug 08, 1970, 50 y.o.   MRN: [id number]  Bedside commode and Tub Transfer Bench ordered through [employer].  [place], [place]

## 2020-10-06 NOTE — Progress Notes (Signed)
PROGRESS NOTE   Subjective/Complaints:  Online sign interpreter used No issues overnite, large BM today , no problems in therapy yesterday    ROS: Neg CP, SOB, N/V/D   Objective:   No results found. No results for input(s): WBC, HGB, HCT, PLT in the last 72 hours. No results for input(s): NA, K, CL, CO2, GLUCOSE, BUN, CREATININE, CALCIUM in the last 72 hours.  Intake/Output Summary (Last 24 hours) at 10/06/2020 0819 Last data filed at 10/06/2020 0803 Gross per 24 hour  Intake 697 ml  Output --  Net 697 ml         Physical Exam: Vital Signs Blood pressure (!) 155/79, pulse 68, temperature 98.4 F (36.9 C), temperature source Oral, resp. rate 16, weight 103.3 kg, SpO2 98 %.   General: No acute distress Mood and affect are appropriate Heart: Regular rate and rhythm no rubs murmurs or extra sounds Lungs: Clear to auscultation, breathing unlabored, no rales or wheezes Abdomen: Positive bowel sounds, soft nontender to palpation, nondistended Extremities: No clubbing, cyanosis, or edema Skin: No evidence of breakdown, no evidence of rash   Psych: pleasant, normal affect, smiling and cheerful  Neuro: Alert and oriented x3. Mute and deaf (congenital). Able to communicate via ASL very well. Strength intact except for 4/5 right sided hand grip, HF, and DF. Decreased sensation throughout right side.    Assessment/Plan: 1. Functional deficits which require 3+ hours per day of interdisciplinary therapy in a comprehensive inpatient rehab setting. Physiatrist is providing close team supervision and 24 hour management of active medical problems listed below. Physiatrist and rehab team continue to assess barriers to discharge/monitor patient progress toward functional and medical goals  Care Tool:  Bathing    Body parts bathed by patient: Right arm, Right lower leg, Left arm, Left lower leg, Chest, Abdomen, Face, Front  perineal area, Right upper leg, Buttocks, Left upper leg         Bathing assist Assist Level: Contact Guard/Touching assist     Upper Body Dressing/Undressing Upper body dressing   What is the patient wearing?: Pull over shirt    Upper body assist Assist Level: Set up assist    Lower Body Dressing/Undressing Lower body dressing      What is the patient wearing?: Pants     Lower body assist Assist for lower body dressing: Contact Guard/Touching assist     Toileting Toileting    Toileting assist Assist for toileting: Contact Guard/Touching assist     Transfers Chair/bed transfer  Transfers assist     Chair/bed transfer assist level: Contact Guard/Touching assist     Locomotion Ambulation   Ambulation assist      Assist level: Contact Guard/Touching assist Assistive device: No Device Max distance: 300'   Walk 10 feet activity   Assist     Assist level: Contact Guard/Touching assist Assistive device: No Device   Walk 50 feet activity   Assist    Assist level: Contact Guard/Touching assist Assistive device: No Device    Walk 150 feet activity   Assist    Assist level: Contact Guard/Touching assist Assistive device: No Device    Walk 10 feet on uneven surface  activity   Assist     Assist level: Minimal Assistance - Patient > 75% Assistive device: Hand held assist   Wheelchair     Assist Will patient use wheelchair at discharge?: No Type of Wheelchair: Manual    Wheelchair assist level: Supervision/Verbal cueing Max wheelchair distance: >300 ft    Wheelchair 50 feet with 2 turns activity    Assist        Assist Level: Supervision/Verbal cueing   Wheelchair 150 feet activity     Assist      Assist Level: Supervision/Verbal cueing   Blood pressure (!) 155/79, pulse 68, temperature 98.4 F (36.9 C), temperature source Oral, resp. rate 16, weight 103.3 kg, SpO2 98 %.     Medical Problem List and  Plan: 1.  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  Atrial fibrillation: continue Eliquis and amiodarone 3. Pain Management: N/A 4. Depression: continue zoloft. 5. Neuropsych: This patient is capable of making decisions on his own behalf. 6. HTN: continue Coreg, Lasix, Hydralazine, uncontrolled, increase Hydralazine to '75mg'$  TID Vitals:   10/05/20 1933 10/06/20 0536  BP: (!) 169/99 (!) 155/79  Pulse: 67 68  Resp: 16 16  Temp: 97.9 F (36.6 C) 98.4 F (36.9 C)  SpO2: 99% 98%    7. Type 2 DM: HgbA1c 10.3, Continue Lantus. Cannot resume Farxiga since GFR 29. Increase Lantus to 25U. Discussed this with patient.  CBG (last 3)  Recent Labs    10/05/20 1627 10/05/20 2104 10/06/20 0615  GLUCAP 131* 145* 71   Low am CBG reduce lantus  8. CKD stage 4: continue to monitor Creatinine weekly/ 9. OSA: CPAP at night 10. Hx of unprovoked DVT and PE: continue eliquis. Outpatient heme referral for hypercoag w/u 11. Hyponatremia: improved, monitor Na weekly. 12. Anemia of chronic disease from CKD: referral for outpatient colon cancer screening. 13. GERD: continue pepcid and protonix 14. Prolonged QT: avoid QT prolonging medications. 15. HLD: continue statin, Vascepa, and finofibrate 16. Obesity BMI 32.55: provide lifestyle education 17. Constipation: Miralax daily started.  LOS: 3 days A FACE TO FACE EVALUATION WAS PERFORMED  Charlett Blake 10/06/2020, 8:19 AM

## 2020-10-06 NOTE — Progress Notes (Signed)
Occupational Therapy Session Note  Patient Details  Name: Troy Rush MRN: [id number] Date of Birth: 1971-01-26  Today's Date: 10/06/2020 OT Individual Time: [id number] OT Individual Time Calculation (min): 57 min    Short Term Goals: Week 1:  OT Short Term Goal 1 (Week 1): STG=LTG d/t ELOS  Skilled Therapeutic Interventions/Progress Updates:    Treatment session with focus on functional transfers, functional mobility, and RUE NMR.  ASL interpreter present throughout session to provide interpreting services.  Pt received upright in chair reporting already showered and dressed prior to session.  Discussed safety concerns with showering without supervision at this time with pt reporting understanding.  Discussed home setup with pt reporting having tub/shower at home. Pt ambulated to ADL apt without AD with supervision.  Pt completed tub/shower transfer with stepping over tub ledge with supervision and use of grab bars.  Discussed tub transfer bench vs shower chair vs 3 in 1 in shower.  Pt completed transfers with tub bench and 3 in 1, reporting preference for tub bench but unsure as insurance would probably not cover it.  Engaged in [place] fine motor control and coordination with dominoes pieces, increasing challenge with motor control and precision with RUE.  Transitioned to use of push pin and bead activity with focus on in-hand manipulation and fine motor control with RUE.  Pt carried items around treatment gym to return to various shelves while focusing on balance when reaching outside BOS.  Pt required supervision when reaching outside BOS.  Pt returned to room ambulating without AD with supervision. Pt reports understanding "it is a process" but wanting to d/c home asap.    Therapy Documentation Precautions:  Precautions Precautions: Fall, Other (comment) Precaution Comments: watch BP, requires ASL interpretor Restrictions Weight Bearing Restrictions: No  Pain: Pain Assessment Pain Scale:  0-10 Pain Score: 0-No pain    Therapy/Group: Individual Therapy  Simonne Come 10/06/2020, 10:55 AM

## 2020-10-06 NOTE — Progress Notes (Signed)
Patient ID: Troy Rush, male   DOB: [birth year], 50 y.o.   MRN: [id number]  SW informed pt will potentially d/c on Thursday  Ronnica Dreese [place], [place]

## 2020-10-06 NOTE — Progress Notes (Signed)
Occupational Therapy Session Note  Patient Details  Name: Troy Rush MRN: [id number] Date of Birth: [birth year]  Today's Date: 10/06/2020 OT Individual Time: 2505-3976 OT Individual Time Calculation (min): 57 min    Short Term Goals: Week 1:  OT Short Term Goal 1 (Week 1): STG=LTG d/t ELOS  Skilled Therapeutic Interventions/Progress Updates:   Met pt sitting in recliner, ASL interpreter present, pt agreed to session. Treatment session was focused on dynamic standing balance, IADL participation and community ambulation for leisure. Pt was supervision for sit > stand transfer from recliner with no AD. Pt led OT, OTS and interpreter to rehab apartment with no assistance for directions. OTS instructed pt to maneuver throughout kitchen and clean up to simulate work duties. Pt maintained standing balance when bending down and primarily used RUE to reach wash cloths on the ground. Pt ambulated to pantry for broom to gather the remaining trash on floor. Pt used BUE for sweeping and gathering trash in dust pan. OT inquired on pt's breakfast routine, pt expressed he normally eats oatmeal. OTS instructed pt to navigate through kitchen and gather the required items for his breakfast. Pt gathered all required items with no cues. Pt brought all dishes gathered to sink and hand washed them requiring pt to reach forward and maintain balance. Pt held onto sink for stability as needed while washing and drying the dishes. OTS offered community ambulation outside, pt agreed. Pt ambulated outside with no AD or assistance for directions. Pt was supervision for stand > sit on narrow chair. Pt reported liking to walk and write for leisure activities. Pt ambulated through grass and around hospital to practice maneuvering on different terrains. Pt ambulated back to room, OT provided education on signs of a stroke (BFAST). Pt was left in recliner, all needs met.   Therapy Documentation Precautions:   Precautions Precautions: Fall, Other (comment) Precaution Comments: watch BP, requires ASL interpretor Restrictions Weight Bearing Restrictions: No Vital Signs: Therapy Vitals Temp: 98.5 F (36.9 C) Temp Source: Oral Pulse Rate: 66 Resp: 18 BP: 129/79 Patient Position (if appropriate): Sitting Oxygen Therapy SpO2: 99 % O2 Device: Room Air Pain: Pain Assessment Pain Scale: 0-10 Pain Score: 0-No pain Faces Pain Scale: No hurt   Therapy/Group: Individual Therapy  Brandin Dilday 10/06/2020, 3:06 PM

## 2020-10-06 NOTE — Progress Notes (Signed)
Physical Therapy Session Note  Patient Details  Name: Troy Rush MRN: [id number] Date of Birth: 03-18-1971  Today's Date: 10/06/2020 PT Individual Time: 1035-1130 PT Individual Time Calculation (min): 55 min   Short Term Goals: Week 1:  PT Short Term Goal 1 (Week 1): STG=LTG due to ELOS.  Skilled Therapeutic Interventions/Progress Updates:    Patient in room seated on couch and reports no issues.  Seen throughout session with in person interpreter Brandi.  Patient sit to stand with S and pt ambulated with S to CGA to therapy gym x 180'.  Patient performed high level balance activities including tandem gait, side stepping crossing over in front and behind and braiding all with CGA and occasional use of wall rail.  Patient standing working on SLS to roll ball under foot 10x CW then CCW R then L foot with intermittent UE support on table.  Patient ambulated with ball toss to self x 150' with CGA to S, then ball toss over shoulder to therapist and catching over opposite shoulder.  Performed balance activity bouncing ball on rebounder with R foot on 6" step for more SLS on L.  Then bouncing ball on rebounder while standing on [employer] with feet together with close S and tossing ball over shoulder and catching over opposite shoulder while on [employer] with feet together.  In person interpreter had to leave with 15 minutes in session so obtained [employer] video interpreter for ASL for remainder of session.  Demonstrated LOS activity on [employer] balance system and pt performed with S no UE support with increased time.  Then demonstrated with reactive foot plate at level 6, then pt performed at level 3 with intermittent UE support.  Patient ambulated to room with S and left seated on couch with call bell in reach.  Therapy Documentation Precautions:  Precautions Precautions: Fall, Other (comment) Precaution Comments: watch BP, requires ASL interpretor Restrictions Weight Bearing Restrictions: No  Pain: Pain  Assessment Pain Scale: 0-10 Pain Score: 0-No pain    Therapy/Group: Individual Therapy  Reginia Naas [place], PT 10/06/2020, 11:06 AM

## 2020-10-07 LAB — GLUCOSE, CAPILLARY
Glucose-Capillary: 79 mg/dL (ref 70–99)
Glucose-Capillary: 150 mg/dL — ABNORMAL HIGH (ref 70–99)
Glucose-Capillary: 137 mg/dL — ABNORMAL HIGH (ref 70–99)
Glucose-Capillary: 173 mg/dL — ABNORMAL HIGH (ref 70–99)

## 2020-10-07 MED ORDER — AMIODARONE HCL 200 MG PO TABS: 200.0000 mg | ORAL_TABLET | Freq: Two times a day (BID) | ORAL | 6 refills | Status: DC

## 2020-10-07 MED ORDER — SUCRALFATE 1 G PO TABS: 1.0000 g | ORAL_TABLET | Freq: Three times a day (TID) | ORAL | 0 refills | Status: DC

## 2020-10-07 MED ORDER — APIXABAN 5 MG PO TABS: ORAL_TABLET | ORAL | 11 refills | Status: DC

## 2020-10-07 MED ORDER — FENOFIBRATE 54 MG PO TABS: 54.0000 mg | ORAL_TABLET | Freq: Every day | ORAL | 0 refills | Status: DC

## 2020-10-07 MED ORDER — HYDRALAZINE HCL 100 MG PO TABS: 100.0000 mg | ORAL_TABLET | Freq: Three times a day (TID) | ORAL | 0 refills | Status: DC

## 2020-10-07 MED ORDER — INSULIN GLARGINE 100 UNIT/ML ~~LOC~~ SOLN
20.0000 [IU] | Freq: Every day | SUBCUTANEOUS | Status: DC
  Administered 2020-10-08: 20 [IU] via SUBCUTANEOUS
  Filled 2020-10-07: qty 0.2

## 2020-10-07 MED ORDER — MECLIZINE HCL 25 MG PO TABS: 25.0000 mg | ORAL_TABLET | Freq: Three times a day (TID) | ORAL | 3 refills | Status: DC | PRN

## 2020-10-07 MED ORDER — FUROSEMIDE 20 MG PO TABS: 40.0000 mg | ORAL_TABLET | Freq: Every day | ORAL | 0 refills | Status: DC

## 2020-10-07 MED ORDER — DICYCLOMINE HCL 20 MG PO TABS: 20.0000 mg | ORAL_TABLET | Freq: Two times a day (BID) | ORAL | 0 refills | Status: DC

## 2020-10-07 MED ORDER — ACETAMINOPHEN 325 MG PO TABS: 650.0000 mg | ORAL_TABLET | ORAL | Status: DC | PRN

## 2020-10-07 MED ORDER — ISOSORBIDE MONONITRATE ER 60 MG PO TB24: 60.0000 mg | ORAL_TABLET | Freq: Every day | ORAL | 11 refills | Status: DC

## 2020-10-07 MED ORDER — POLYETHYLENE GLYCOL 3350 17 G PO PACK: 17.0000 g | PACK | Freq: Every day | ORAL | 0 refills | Status: DC

## 2020-10-07 MED ORDER — SERTRALINE HCL 50 MG PO TABS: 50.0000 mg | ORAL_TABLET | Freq: Every day | ORAL | 0 refills | Status: DC

## 2020-10-07 MED ORDER — PANTOPRAZOLE SODIUM 40 MG PO TBEC: 40.0000 mg | DELAYED_RELEASE_TABLET | Freq: Two times a day (BID) | ORAL | 3 refills | Status: DC

## 2020-10-07 MED ORDER — CARVEDILOL 12.5 MG PO TABS: 12.5000 mg | ORAL_TABLET | Freq: Two times a day (BID) | ORAL | 11 refills | Status: DC

## 2020-10-07 MED ORDER — ATORVASTATIN CALCIUM 80 MG PO TABS: 80.0000 mg | ORAL_TABLET | Freq: Every day | ORAL | 11 refills | Status: DC

## 2020-10-07 MED ORDER — LANTUS SOLOSTAR 100 UNIT/ML ~~LOC~~ SOPN: 20.0000 [IU] | PEN_INJECTOR | Freq: Every day | SUBCUTANEOUS | 11 refills | Status: DC

## 2020-10-07 MED ORDER — HYDRALAZINE HCL 50 MG PO TABS
100.0000 mg | ORAL_TABLET | Freq: Three times a day (TID) | ORAL | Status: DC
  Administered 2020-10-07 – 2020-10-08 (×3): 100 mg via ORAL
  Filled 2020-10-07 (×3): qty 2

## 2020-10-07 MED ORDER — VASCEPA 1 G PO CAPS: 2.0000 g | ORAL_CAPSULE | Freq: Two times a day (BID) | ORAL | 5 refills | Status: DC

## 2020-10-07 MED ORDER — FAMOTIDINE 20 MG PO TABS: 20.0000 mg | ORAL_TABLET | Freq: Two times a day (BID) | ORAL | 0 refills | Status: DC

## 2020-10-07 NOTE — Progress Notes (Signed)
Occupational Therapy Discharge Summary  Patient Details  Name: Troy Rush MRN: [id number] Date of Birth: 1970/11/17  Today's Date: 10/07/2020 OT Individual Time: 1405-1500 OT Individual Time Calculation (min): 55 min   Patient has met 8 of 8 long term goals due to improved activity tolerance, improved balance, and improved coordination.  Patient to discharge at overall Independent level.  Patient is discharging home independently and verbalize and demonstrate understanding of education provided throughout sessions.   Reasons goals not met: N/A  Recommendation:  No follow-up OT  Equipment: 3-in-1, shower tub bench  Reasons for discharge: treatment goals met  Patient/family agrees with progress made and goals achieved: Yes  OT Discharge Precautions/Restrictions  Precautions Precautions: Fall;Other (comment) Precaution Comments: requires ASL interpretor Restrictions Weight Bearing Restrictions: No Vital Signs Therapy Vitals Temp: 98.1 F (36.7 C) Pulse Rate: 66 Resp: 16 BP: (!) 144/94 Patient Position (if appropriate): Sitting Oxygen Therapy SpO2: 100 % O2 Device: Room Air Pain Pain Assessment Pain Scale: 0-10 Pain Score: 0-No pain Faces Pain Scale: No hurt ADL ADL Eating: Independent Where Assessed-Eating: Bed level Grooming: Independent Where Assessed-Grooming: Standing at sink Upper Body Bathing: Independent Where Assessed-Upper Body Bathing: Shower Lower Body Bathing: Independent Where Assessed-Lower Body Bathing: Shower Upper Body Dressing: Independent Where Assessed-Upper Body Dressing: Edge of bed Lower Body Dressing: Independent Where Assessed-Lower Body Dressing: Edge of bed Toileting: Independent Where Assessed-Toileting: Glass blower/designer: Programmer, applications Method: Human resources officer: Theatre stage manager Method: Optometrist: Radio broadcast assistant, Other (comment) (3-in-1) Press photographer: IT consultant Method: Heritage manager: Civil engineer, contracting with back Vision Baseline Vision/History: No visual deficits Patient Visual Report: No change from baseline Eye Alignment: Within Functional Limits Ocular Range of Motion: Within Functional Limits Perception  Perception: Within Functional Limits Praxis Praxis: Intact Cognition Overall Cognitive Status: Within Functional Limits for tasks assessed Arousal/Alertness: Awake/alert Orientation Level: Oriented X4 Selective Attention: Appears intact Memory: Appears intact Awareness: Appears intact Problem Solving: Appears intact Safety/Judgment: Appears intact Sensation Sensation Light Touch: Appears Intact Proprioception: Appears Intact Stereognosis: Appears Intact Coordination Gross Motor Movements are Fluid and Coordinated: No Fine Motor Movements are Fluid and Coordinated: Yes Coordination and Movement Description: very mild uncoordination due to R hemi and decreased balance Finger Nose Finger Test: [facility] bilaterally Motor  Motor Motor: Hemiplegia;Abnormal postural alignment and control Motor - Skilled Clinical Observations: very mild uncoordination due to R hemi and decreased balance Mobility  Bed Mobility Bed Mobility: Rolling Right;Rolling Left;Sit to Supine;Supine to Sit Rolling Right: Independent Rolling Left: Independent Supine to Sit: Independent Sit to Supine: Independent Transfers Sit to Stand: Independent with assistive device Stand to Sit: Independent with assistive device  Trunk/Postural Assessment  Cervical Assessment Cervical Assessment: Within Functional Limits Thoracic Assessment Thoracic Assessment: Within Functional Limits Lumbar Assessment Lumbar Assessment: Within Functional Limits Postural Control Postural Control: Deficits on evaluation  Balance Balance Balance Assessed: Yes Static Sitting Balance Static Sitting - Balance Support: Feet  supported;No upper extremity supported Static Sitting - Level of Assistance: 7: Independent Dynamic Sitting Balance Dynamic Sitting - Balance Support: Feet supported;No upper extremity supported Dynamic Sitting - Level of Assistance: 7: Independent Static Standing Balance Static Standing - Balance Support: No upper extremity supported Static Standing - Level of Assistance: 6: Modified independent (Device/Increase time) Dynamic Standing Balance Dynamic Standing - Balance Support: No upper extremity supported Dynamic Standing - Level of Assistance: 6: Modified independent (Device/Increase time) Extremity/Trunk Assessment RUE Assessment RUE Assessment: Within Functional Limits LUE Assessment LUE Assessment:  Within Functional Limits  Skilled Therapeutic Interventions/Progress Updates:  Met pt sitting in recliner, ASL interpreter present in room, pt agreed to session. Treatment session was focused on community reintegration and preparing for d/c. Pt was ind for sit > stand transfer from recliner with no AD. Pt ambulated to gift shop with no AD, able to recall directions. OTS instructed pt to locate objects throughout store he could buy with $5, simulating shopping in grocery store to assess standing balance in community settings. Pt easily able distinguish which products would and would not be within price range. Pt ambulated to [facility] building to inquire of missed appointment, pt able to communicate needs to receptionist. Pt verbalized transportation ([employer] or buses, depending on time) to community such as appointments, grocery stores, and work.  OT inquired of distance from stop at house and to job, pt reported being able to walk distances. Pt ambulated outside with no AD and directed back to room. Pt required one break before returning to room to conserve energy. Pt sat in recliner with ind, OTS tested fine motor coordination, MMT, sensation and strength. Pt reported no questions or  concerns for d/c. Pt was left in recliner, all needs met.    Makayla Lanter 10/07/2020, 3:16 PM

## 2020-10-07 NOTE — Progress Notes (Signed)
Patient ID: Troy Rush, male   DOB: 08/19/1970, 50 y.o.   MRN: [id number]  No therapy follow up at d/c, per therapy team  Erlene Quan, [place]

## 2020-10-07 NOTE — IPOC Note (Addendum)
Overall Plan of Care [facility]) Patient Details Name: Troy Rush MRN: [id number] DOB: 1971-03-01  Admitting Diagnosis: Subcortical infarction [facility])  Hospital Problems: Principal Problem:   Subcortical infarction [facility])     Functional Problem List: Nursing Sensory, Medication Management  PT Balance, Behavior, Edema, Endurance, Motor, Nutrition, Pain, Safety, Sensory, Skin Integrity, Perception  OT Balance, Endurance, Motor, Sensory  SLP    TR         Basic ADL's: OT Bathing, Dressing, Toileting     Advanced  ADL's: OT Simple Meal Preparation, Laundry, Light Housekeeping     Transfers: PT Bed Mobility, Bed to Chair, Car, Patent attorney, Agricultural engineer: PT Ambulation, Emergency planning/management officer, Stairs     Additional Impairments: OT None  SLP        TR      Anticipated Outcomes Item Anticipated Outcome  Self Feeding no goal set  Swallowing      Basic self-care  mod I  Toileting  mod I   Bathroom Transfers mod I  Bowel/Bladder  continent B/B  Transfers  Mod I using LRAD  Locomotion  Mod I household, supervision community level using LRAD  Communication     Cognition     Pain  no c/o pain  Safety/Judgment  no injury while in rehab   Therapy Plan: PT Intensity: Minimum of 1-2 x/day ,45 to 90 minutes PT Frequency: 5 out of 7 days PT Duration Estimated Length of Stay: 7-10 days OT Intensity: Minimum of 1-2 x/day, 45 to 90 minutes OT Frequency: 5 out of 7 days OT Duration/Estimated Length of Stay: 7-10 days     Due to the current state of emergency, patients may not be receiving their 3-hours of Medicare-mandated therapy.   Team Interventions: Nursing Interventions Disease Management/Prevention, Medication Management, Discharge Planning  PT interventions Ambulation/gait training, Discharge planning, DME/adaptive equipment instruction, Functional mobility training, Pain management, Psychosocial support, Splinting/orthotics, Therapeutic  Activities, UE/LE Strength taining/ROM, Visual/perceptual remediation/compensation, Wheelchair propulsion/positioning, UE/LE Coordination activities, Therapeutic Exercise, Stair training, Skin care/wound management, Patient/family education, Neuromuscular re-education, Functional electrical stimulation, Disease management/prevention, Academic librarian, Training and development officer  OT Interventions Training and development officer, Discharge planning, Pain management, Self Care/advanced ADL retraining, Therapeutic Activities, UE/LE Coordination activities, Therapeutic Exercise, Patient/family education, Functional mobility training, Community reintegration, Engineer, drilling, Neuromuscular re-education, Psychosocial support, UE/LE Strength taining/ROM  SLP Interventions    TR Interventions    SW/CM Interventions Discharge Planning, Psychosocial Support, Patient/Family Education, Disease Management/Prevention   Barriers to Discharge MD  Medical stability and Lack of/limited family support  Nursing Decreased caregiver support, Home environment access/layout 1 level 2ste bil rails solo; dtr available to assist intermittently  PT Lack of/limited family support, Inaccessible home environment    OT Decreased caregiver support unclear if his daughter will be home by d/c  SLP      SW       Team Discharge Planning: Destination: PT-Home ,OT- Home , SLP-  Projected Follow-up: PT-Outpatient PT, OT-  Outpatient OT, SLP-None Projected Equipment Needs: PT-To be determined, OT- Tub/shower seat, SLP-None recommended by SLP Equipment Details: PT-may need RW or SPC depending on patient's progress, OT-  Patient/family involved in discharge planning: PT- Patient,  OT-Patient, SLP-Patient  MD ELOS: 5-7d Medical Rehab Prognosis:  Excellent Assessment: 50 year old man with PMH of HLD, systolic CHF, HTN, atrial fibrillation, nonischemic cardiomyopathy with ICD, DM2, deafness and mutism, hx of  DVT/PE on Eliquis, depression, CKD 4, OSA, who presented to the ED with dizziness and right sided  numbness and was found to have a left subcortical infarct due to small vessel disease as per neurology. CT Head shows no abnormality and MRI was unobtainable due to ICD placement. Not a tPA candidate given anticoagulation with Eliquis. Admitted to CIR on 6/25 with impaired mobility and ADLs secondary to his stroke.     Needs HTN management  See Team Conference Notes for weekly updates to the plan of care

## 2020-10-07 NOTE — Progress Notes (Signed)
Physical Therapy Discharge Summary  Patient Details  Name: Troy Rush MRN: [id number] Date of Birth: [birth year]  Today's Date: 10/07/2020 PT Individual Time: 1030-1119 PT Individual Time Calculation (min): 49 min   Today's Date: 10/07/2020 PT Missed Time: 11 Minutes  Patient has met 9 of 9 long term goals due to improved activity tolerance, improved balance, improved postural control, and improved coordination. Patient to discharge at an ambulatory level Supervision/Mod I. Pt to discharge home alone and has verbalized and demonstrated confidence with all tasks to ensure safe discharge home.   All goals met   Recommendation:  Patient does not require and ongoing skilled PT services at this time.  Equipment: No equipment provided  Reasons for discharge: treatment goals met and discharge from hospital  Patient/family agrees with progress made and goals achieved: Yes  Today's Interventions: Received pt sitting in recliner, pt agreeable to PT treatment, and denied any pain during session. Used in person interpreter for ASL services throughout session. Session with emphasis on discharge planning, functional mobility/transfers, generalized strengthening, dynamic standing balance/coordination, ambulation, simulated car transfers, stair navigation, and improved activity tolerance. Pt ambulated 168f x 1 and 715fx 1 without AD mod I to 67M ortho gym with no LOB noted. Pt performed ambulatory simulated car transfer without AD and supervision and ambulated 103fn uneven surfaces (ramp and mulch) mod I and descended 1 6in curb without AD and supervision. Pt ambulated additional 53f54fthout AD mod I to therapy gym and navigated 12 steps with 2 rails and supervision ascending and descending with a step through pattern with no LOB noted. Pt ambulated additional 125ft67fdayroom and performed BLE strengthening on Nustep at workload 8 decreasing to workload 3 for 8.5 minutes for a total of 388 steps for  improved cardiovascular endurance and reciprocal movement training with multiple rest breaks due to fatigue and muscle "soreness" LLE>RLE. Pt performed TUG without AD and supervision x 3 trials with average of 9 seconds (>13.5 indicates high fall risk) then ambulated >500ft 14fout AD and supervision back to room with 1 rest break on elevator. Concluded session with pt sitting in recliner with all needs within reach. Pt made mod I in room.   PT Discharge Precautions/Restrictions Precautions Precautions: Fall;Other (comment) Precaution Comments: requires ASL interpretor Restrictions Weight Bearing Restrictions: No Cognition Overall Cognitive Status: Within Functional Limits for tasks assessed Arousal/Alertness: Awake/alert Orientation Level: Oriented X4 Memory: Appears intact Awareness: Appears intact Problem Solving: Appears intact Safety/Judgment: Appears intact Sensation Sensation Light Touch: Appears Intact Proprioception: Appears Intact Coordination Gross Motor Movements are Fluid and Coordinated: No Fine Motor Movements are Fluid and Coordinated: Yes Coordination and Movement Description: very mild uncoordination due to R hemi and decreased balance Finger Nose Finger Test: WFL [facility]ally Heel Shin Test: WFL [facility]ally Motor  Motor Motor: Hemiplegia;Abnormal postural alignment and control Motor - Skilled Clinical Observations: very mild uncoordination due to R hemi and decreased balance  Mobility Bed Mobility Bed Mobility: Rolling Right;Rolling Left;Sit to Supine;Supine to Sit Rolling Right: Independent Rolling Left: Independent Supine to Sit: Independent Sit to Supine: Independent Transfers Transfers: Sit to Stand;Stand to Sit;Stand Pivot Transfers Sit to Stand: Independent with assistive device Stand to Sit: Independent with assistive device Stand Pivot Transfers: Independent with assistive device Transfer (Assistive device): None Locomotion  Gait Ambulation:  Yes Gait Assistance: Independent with assistive device Gait Distance (Feet): 150 Feet Assistive device: None Gait Gait: Yes Gait Pattern: Impaired Gait Pattern: Decreased trunk rotation;Step-through pattern;Decreased stride length;Decreased step length - right;Decreased step  length - left;Poor foot clearance - left;Poor foot clearance - right Gait velocity: slightly decreased Stairs / Additional Locomotion Stairs: Yes Stairs Assistance: Supervision/Verbal cueing Stair Management Technique: Two rails Number of Stairs: 12 Height of Stairs: 6 Ramp: Independent with assistive device Curb: Supervision/Verbal cueing Wheelchair Mobility Wheelchair Mobility: No  Trunk/Postural Assessment  Cervical Assessment Cervical Assessment: Within Functional Limits Thoracic Assessment Thoracic Assessment: Within Functional Limits Lumbar Assessment Lumbar Assessment: Within Functional Limits Postural Control Postural Control: Deficits on evaluation  Balance Balance Balance Assessed: Yes Static Sitting Balance Static Sitting - Balance Support: Feet supported;No upper extremity supported Static Sitting - Level of Assistance: 7: Independent Dynamic Sitting Balance Dynamic Sitting - Balance Support: Feet supported;No upper extremity supported Dynamic Sitting - Level of Assistance: 7: Independent Static Standing Balance Static Standing - Balance Support: No upper extremity supported Static Standing - Level of Assistance: 6: Modified independent (Device/Increase time) Dynamic Standing Balance Dynamic Standing - Balance Support: No upper extremity supported Dynamic Standing - Level of Assistance: 6: Modified independent (Device/Increase time) Dynamic Standing - Comments: with transfers Extremity Assessment  RLE Assessment RLE Assessment: Within Functional Limits General Strength Comments: grossly 5/5 throughout LLE Assessment LLE Assessment: Within Functional Limits General Strength Comments:  Grossly 5/5 throughout  Alfonse Alpers PT, DPT  10/07/2020, 7:24 AM

## 2020-10-07 NOTE — Progress Notes (Signed)
Occupational Therapy Session Note  Patient Details  Name: Troy Rush MRN: [id number] Date of Birth: 1970-06-21  Today's Date: 10/07/2020 OT Individual Time: 7471-5953 OT Individual Time Calculation (min): 53 min    Short Term Goals: Week 1:  OT Short Term Goal 1 (Week 1): STG=LTG d/t ELOS  Skilled Therapeutic Interventions/Progress Updates:    Pt received in room and consented to OT tx. In person ASL interpreter present for entire session. Pt seen for ADL discharge analysis including oral care/hygiene, grooming, showering, dressing, and functional transfers. Pt completed all ADLs independently with no device. Pt completed shower standing, oral care standing, and dressed LB while standing while steadying self with hand on grab bar. Pt then walked with no AD down to therapy gym, instructed to complete 24 piece jumbo jigsaw puzzle while standing to increase standing tolerance and problem solving skills. Pt was able to complete puzzle independently with time. Pt then instructed in card matching game to reach for various surfaces while standing to increase balance when reaching outside of BOS. After tx, pt walked back with no AD from gym to room and left seated in chair with all needs met.   Therapy Documentation Precautions:  Precautions Precautions: Fall, Other (comment) Precaution Comments: requires ASL interpretor Restrictions Weight Bearing Restrictions: No  Vital Signs: Therapy Vitals Temp: 98.2 F (36.8 C) Pulse Rate: 65 Resp: 16 BP: (!) 167/82 Patient Position (if appropriate): Lying Oxygen Therapy SpO2: 99 % O2 Device: Room Air Pain:   none    Therapy/Group: Individual Therapy  [name] 10/07/2020, 7:57 AM

## 2020-10-07 NOTE — Progress Notes (Signed)
PROGRESS NOTE   Subjective/Complaints:  Live ASL interpreter at bedside  Pt sleeping but awakens without c/o No pains or other c/o  Discussed D/C in am   ROS: Neg CP, SOB, N/V/D   Objective:   No results found. No results for input(s): WBC, HGB, HCT, PLT in the last 72 hours. No results for input(s): NA, K, CL, CO2, GLUCOSE, BUN, CREATININE, CALCIUM in the last 72 hours.  Intake/Output Summary (Last 24 hours) at 10/07/2020 0926 Last data filed at 10/07/2020 0700 Gross per 24 hour  Intake 714 ml  Output --  Net 714 ml         Physical Exam: Vital Signs Blood pressure (!) 167/82, pulse 65, temperature 98.2 F (36.8 C), resp. rate 16, weight 105.1 kg, SpO2 99 %.  General: No acute distress Mood and affect are appropriate Heart: Regular rate and rhythm no rubs murmurs or extra sounds Lungs: Clear to auscultation, breathing unlabored, no rales or wheezes Abdomen: Positive bowel sounds, soft nontender to palpation, nondistended Extremities: No clubbing, cyanosis, or edema Skin: No evidence of breakdown, no evidence of rash Psych: pleasant, normal affect, smiling and cheerful  Neuro: Alert and oriented x3. Mute and deaf (congenital). Able to communicate via ASL very well. Strength intact except for 4/5 right sided hand grip, HF, and DF. Decreased sensation throughout right side.    Assessment/Plan: 1. Functional deficits which require 3+ hours per day of interdisciplinary therapy in a comprehensive inpatient rehab setting. Physiatrist is providing close team supervision and 24 hour management of active medical problems listed below. Physiatrist and rehab team continue to assess barriers to discharge/monitor patient progress toward functional and medical goals  Care Tool:  Bathing    Body parts bathed by patient: Right arm, Right lower leg, Left arm, Left lower leg, Chest, Abdomen, Face, Front perineal area,  Right upper leg, Buttocks, Left upper leg         Bathing assist Assist Level: Contact Guard/Touching assist     Upper Body Dressing/Undressing Upper body dressing   What is the patient wearing?: Pull over shirt    Upper body assist Assist Level: Set up assist    Lower Body Dressing/Undressing Lower body dressing      What is the patient wearing?: Pants     Lower body assist Assist for lower body dressing: Contact Guard/Touching assist     Toileting Toileting    Toileting assist Assist for toileting: Contact Guard/Touching assist     Transfers Chair/bed transfer  Transfers assist     Chair/bed transfer assist level: Supervision/Verbal cueing     Locomotion Ambulation   Ambulation assist      Assist level: Supervision/Verbal cueing Assistive device: No Device Max distance: 250'   Walk 10 feet activity   Assist     Assist level: Supervision/Verbal cueing Assistive device: No Device   Walk 50 feet activity   Assist    Assist level: Supervision/Verbal cueing Assistive device: No Device    Walk 150 feet activity   Assist    Assist level: Supervision/Verbal cueing Assistive device: No Device    Walk 10 feet on uneven surface  activity   Assist  Assist level: Minimal Assistance - Patient > 75% Assistive device: Hand held assist   Wheelchair     Assist Will patient use wheelchair at discharge?: No Type of Wheelchair: Manual    Wheelchair assist level: Supervision/Verbal cueing Max wheelchair distance: >300 ft    Wheelchair 50 feet with 2 turns activity    Assist        Assist Level: Supervision/Verbal cueing   Wheelchair 150 feet activity     Assist      Assist Level: Supervision/Verbal cueing   Blood pressure (!) 167/82, pulse 65, temperature 98.2 F (36.8 C), resp. rate 16, weight 105.1 kg, SpO2 99 %.     Medical Problem List and Plan: 1.  Impaired mobility and ADLs secondary to left  subcortical infarct due to small vessel disease             -patient may shower             -ELOS/Goals: 5-7 days modI  Team conference today please see physician documentation under team conference tab, met with team  to discuss problems,progress, and goals. Formulized individual treatment plan based on medical history, underlying problem and comorbidities.  2.  Atrial fibrillation: continue Eliquis and amiodarone 3. Pain Management: N/A 4. Depression: continue zoloft. 5. Neuropsych: This patient is capable of making decisions on his own behalf. 6. HTN: continue Coreg, Lasix, Hydralazine, uncontrolled, increase Hydralazine to 76m TID Vitals:   10/06/20 2133 10/07/20 0445  BP:  (!) 167/82  Pulse: 70 65  Resp: 18 16  Temp:  98.2 F (36.8 C)  SpO2: 99% 99%   Still with systolic elevation will increase hydralazine to 1013mTID 7. Type 2 DM: HgbA1c 10.3, Continue Lantus. Cannot resume Farxiga since GFR 29. Increase Lantus to 25U. Discussed this with patient.  CBG (last 3)  Recent Labs    10/06/20 1628 10/06/20 2057 10/07/20 0611  GLUCAP 137* 230* 79   Low am CBG reduce lantus to 20U  8. CKD stage 4: continue to monitor Creatinine weekly/ 9. OSA: CPAP at night 10. Hx of unprovoked DVT and PE: continue eliquis. Outpatient heme referral for hypercoag w/u 11. Hyponatremia: improved, monitor Na weekly. 12. Anemia of chronic disease from CKD: referral for outpatient colon cancer screening. 13. GERD: continue pepcid and protonix 14. Prolonged QT: avoid QT prolonging medications. 15. HLD: continue statin, Vascepa, and finofibrate 16. Obesity BMI 32.55: provide lifestyle education 17. Constipation: Miralax daily started.  LOS: 4 days A FACE TO FACE EVALUATION WAS PERFORMED  AnCharlett Blake/29/2022, 9:26 AM

## 2020-10-07 NOTE — Discharge Summary (Signed)
Physician Discharge Summary  Patient ID: Troy Rush MRN: [id number] DOB/AGE: 10/22/1970 50 y.o.  Admit date: 10/03/2020 Discharge date: 10/08/2020  Discharge Diagnoses:  Principal Problem:   Subcortical infarction [facility]) Atrial fibrillation Diabetes mellitus CKD stage IV OSA History of unprovoked DVT and pulmonary emboli Anemia of chronic disease GERD Hyperlipidemia Obesity Diastolic congestive heart failure Mutism/deafness   Discharged Condition: Stable  Significant Diagnostic Studies: DG Chest 2 View  Result Date: 09/29/2020 CLINICAL DATA:  Shortness of breath. EXAM: CHEST - 2 VIEW COMPARISON:  09/11/2020 FINDINGS: The right ventricular pacer wire is stable. The CardioMEMS pulmonary artery pressure device is again noted. The heart is borderline enlarged but unchanged. The mediastinal and hilar contours are within normal limits and unchanged. No acute pulmonary findings. No infiltrates or effusions. No pneumothorax. The bony thorax is intact. IMPRESSION: No acute cardiopulmonary findings. Electronically Signed   By: Marijo Sanes M.D.   On: 09/29/2020 11:56   DG Chest 2 View  Result Date: 09/11/2020 CLINICAL DATA:  CHF EXAM: CHEST - 2 VIEW COMPARISON:  07/07/2020 FINDINGS: AICD unchanged in position. Cardiac enlargement without heart failure. No edema or effusion. Slight atelectasis in the lung bases. IMPRESSION: Mild bibasilar atelectasis.  Negative for heart failure Electronically Signed   By: Franchot Gallo M.D.   On: 09/11/2020 08:10   CT HEAD WO CONTRAST  Result Date: 10/01/2020 CLINICAL DATA:  Neuro deficit.  Acute stroke suspected.  Follow-up. EXAM: CT HEAD WITHOUT CONTRAST TECHNIQUE: Contiguous axial images were obtained from the base of the skull through the vertex without intravenous contrast. COMPARISON:  09/30/2020 FINDINGS: Brain: No evidence of acute infarction, hemorrhage, hydrocephalus, extra-axial collection or mass lesion/mass effect. Vascular: No hyperdense  vessel or unexpected calcification. Skull: Normal. Negative for fracture or focal lesion. Sinuses/Orbits: No acute finding. Other: None. IMPRESSION: No acute intracranial abnormality. Electronically Signed   By: Nolon Nations M.D.   On: 10/01/2020 13:56   CARDIAC CATHETERIZATION  Result Date: 09/10/2020 Successful Cardiomems pressure sensor placement.   CUP PACEART REMOTE DEVICE CHECK  Result Date: 09/22/2020 Scheduled remote reviewed. Normal device function.  Next remote 91 days. HB  CT HEAD CODE STROKE WO CONTRAST  Result Date: 09/30/2020 CLINICAL DATA:  Code stroke EXAM: CT HEAD WITHOUT CONTRAST CT ANGIOGRAPHY OF THE HEAD AND NECK TECHNIQUE: Contiguous axial images were obtained from the base of the skull through the vertex without intravenous contrast. Multidetector CT imaging of the head and neck was performed using the standard protocol during bolus administration of intravenous contrast. Multiplanar CT image reconstructions and MIPs were obtained to evaluate the vascular anatomy. Carotid stenosis measurements (when applicable) are obtained utilizing NASCET criteria, using the distal internal carotid diameter as the denominator. CONTRAST:  176m OMNIPAQUE IOHEXOL 350 MG/ML SOLN COMPARISON:  05/05/2020 CT head FINDINGS: CT HEAD Brain: There is no acute intracranial hemorrhage, mass effect, or edema. Gray-white differentiation is preserved. There is no extra-axial fluid collection. Ventricles and sulci are stable in size and configuration. Vascular: No hyperdense vessel. Skull: Calvarium is unremarkable. Sinuses/Orbits: No acute finding. Other: None. Review of the MIP images confirms the above findings ASPECTS ([facility]) - Ganglionic level infarction (caudate, lentiform nuclei, internal capsule, insula, M1-M3 cortex): 7 - Supraganglionic infarction (M4-M6 cortex): 3 Total score (0-10 with 10 being normal): 10 CTA NECK Aortic arch: Great vessel origins are patent. Right  carotid system: Patent. No stenosis or evidence of dissection. Left carotid system: Patent.  No stenosis or evidence of dissection. Vertebral arteries: Patent and  nearly codominant. No stenosis or evidence of dissection. Skeleton: No significant osseous abnormality. Other neck: Unremarkable. Upper chest: Included upper lobes are clear.  Left chest wall ICD. Review of the MIP images confirms the above findings CTA HEAD Anterior circulation: Intracranial internal carotid arteries are patent. There is moderate narrowing of the left distal cavernous and paraclinoid ICA presumably secondary to circumferential noncalcified plaque. Anterior cerebral arteries are patent. Anterior communicating artery is present. Middle cerebral arteries are patent. Posterior circulation: Intracranial vertebral arteries are patent. Basilar artery is patent. Major cerebellar artery origins are patent. Posterior cerebral arteries are patent. Venous sinuses: Patent as allowed by contrast bolus timing. Review of the MIP images confirms the above findings IMPRESSION: There is no acute intracranial hemorrhage or evidence of acute infarction. ASPECT score is 10. No large vessel occlusion. Moderate narrowing of the distal left cavernous and paraclinoid ICA presumably secondary to circumferential noncalcified plaque. Initial results were communicated to Dr. Lorrin Goodell at 12:58 pm on 09/30/2020 by text page via the [facility] messaging system. Electronically Signed   By: Macy Mis M.D.   On: 09/30/2020 13:16   ECHOCARDIOGRAM LIMITED  Result Date: 09/30/2020    ECHOCARDIOGRAM REPORT   Patient Name:   Troy Rush Date of Exam: 09/30/2020 Medical Rec #:  [id number]  Height:       70.0 in Accession #:    [id number] Weight:       231.9 lb Date of Birth:  1970/07/20 BSA:          2.223 m Patient Age:    50 years   BP:           145/88 mmHg Patient Gender: M          HR:           82 bpm. Exam Location:  Inpatient Procedure: Limited Echo, Color Doppler and  Cardiac Doppler Indications:    Stroke i63.9  History:        Patient has prior history of Echocardiogram examinations, most                 recent 02/06/2020. CHF, Defibrillator; Risk                 Factors:Hypertension, Diabetes, Dyslipidemia and Sleep Apnea.  Sonographer:    Raquel Sarna Senior RDCS Referring Phys: Anibal Henderson  Sonographer Comments: COVID+ at time of study IMPRESSIONS  1. Left ventricular ejection fraction, by estimation, is 35 to 40%. The left ventricle has moderately decreased function. The left ventricle demonstrates global hypokinesis. Left ventricular diastolic function could not be evaluated.  2. Right ventricular systolic function is normal. The right ventricular size is normal. There is normal pulmonary artery systolic pressure.  3. The mitral valve is normal in structure. No evidence of mitral valve regurgitation. No evidence of mitral stenosis.  4. The aortic valve is tricuspid. Aortic valve regurgitation is not visualized. No aortic stenosis is present.  5. The inferior vena cava is dilated in size with <50% respiratory variability, suggesting right atrial pressure of 15 mmHg. FINDINGS  Left Ventricle: Left ventricular ejection fraction, by estimation, is 35 to 40%. The left ventricle has moderately decreased function. The left ventricle demonstrates global hypokinesis. The left ventricular internal cavity size was normal in size. There is no left ventricular hypertrophy. Left ventricular diastolic function could not be evaluated. Right Ventricle: The right ventricular size is normal. No increase in right ventricular wall thickness. Right ventricular systolic function is normal. There is normal pulmonary artery systolic  pressure. The tricuspid regurgitant velocity is 1.86 m/s, and  with an assumed right atrial pressure of 15 mmHg, the estimated right ventricular systolic pressure is 0000000 mmHg. Left Atrium: Left atrial size was normal in size. Right Atrium: Right atrial size was normal in  size. Pericardium: There is no evidence of pericardial effusion. Mitral Valve: The mitral valve is normal in structure. No evidence of mitral valve regurgitation. No evidence of mitral valve stenosis. Tricuspid Valve: The tricuspid valve is normal in structure. Tricuspid valve regurgitation is mild . No evidence of tricuspid stenosis. Aortic Valve: The aortic valve is tricuspid. Aortic valve regurgitation is not visualized. No aortic stenosis is present. Pulmonic Valve: The pulmonic valve was normal in structure. Pulmonic valve regurgitation is not visualized. No evidence of pulmonic stenosis. Aorta: The aortic root is normal in size and structure. Venous: The inferior vena cava is dilated in size with less than 50% respiratory variability, suggesting right atrial pressure of 15 mmHg. IAS/Shunts: No atrial level shunt detected by color flow Doppler.  AORTIC VALVE LVOT Vmax:   70.50 cm/s LVOT Vmean:  53.100 cm/s LVOT VTI:    0.142 m TRICUSPID VALVE TR Peak grad:   13.8 mmHg TR Vmax:        186.00 cm/s  SHUNTS Systemic VTI: 0.14 m Skeet Latch MD Electronically signed by Skeet Latch MD Signature Date/Time: 09/30/2020/5:45:29 PM    Final    CT ANGIO HEAD NECK W WO CM (CODE STROKE)  Result Date: 09/30/2020 CLINICAL DATA:  Code stroke EXAM: CT HEAD WITHOUT CONTRAST CT ANGIOGRAPHY OF THE HEAD AND NECK TECHNIQUE: Contiguous axial images were obtained from the base of the skull through the vertex without intravenous contrast. Multidetector CT imaging of the head and neck was performed using the standard protocol during bolus administration of intravenous contrast. Multiplanar CT image reconstructions and MIPs were obtained to evaluate the vascular anatomy. Carotid stenosis measurements (when applicable) are obtained utilizing NASCET criteria, using the distal internal carotid diameter as the denominator. CONTRAST:  151m OMNIPAQUE IOHEXOL 350 MG/ML SOLN COMPARISON:  05/05/2020 CT head FINDINGS: CT HEAD Brain:  There is no acute intracranial hemorrhage, mass effect, or edema. Gray-white differentiation is preserved. There is no extra-axial fluid collection. Ventricles and sulci are stable in size and configuration. Vascular: No hyperdense vessel. Skull: Calvarium is unremarkable. Sinuses/Orbits: No acute finding. Other: None. Review of the MIP images confirms the above findings ASPECTS ([facility] Early CT Score) - Ganglionic level infarction (caudate, lentiform nuclei, internal capsule, insula, M1-M3 cortex): 7 - Supraganglionic infarction (M4-M6 cortex): 3 Total score (0-10 with 10 being normal): 10 CTA NECK Aortic arch: Great vessel origins are patent. Right carotid system: Patent. No stenosis or evidence of dissection. Left carotid system: Patent.  No stenosis or evidence of dissection. Vertebral arteries: Patent and nearly codominant. No stenosis or evidence of dissection. Skeleton: No significant osseous abnormality. Other neck: Unremarkable. Upper chest: Included upper lobes are clear.  Left chest wall ICD. Review of the MIP images confirms the above findings CTA HEAD Anterior circulation: Intracranial internal carotid arteries are patent. There is moderate narrowing of the left distal cavernous and paraclinoid ICA presumably secondary to circumferential noncalcified plaque. Anterior cerebral arteries are patent. Anterior communicating artery is present. Middle cerebral arteries are patent. Posterior circulation: Intracranial vertebral arteries are patent. Basilar artery is patent. Major cerebellar artery origins are patent. Posterior cerebral arteries are patent. Venous sinuses: Patent as allowed by contrast bolus timing. Review of the MIP images confirms the above  findings IMPRESSION: There is no acute intracranial hemorrhage or evidence of acute infarction. ASPECT score is 10. No large vessel occlusion. Moderate narrowing of the distal left cavernous and paraclinoid ICA presumably secondary to  circumferential noncalcified plaque. Initial results were communicated to Dr. Lorrin Goodell at 12:58 pm on 09/30/2020 by text page via the [facility] messaging system. Electronically Signed   By: Macy Mis M.D.   On: 09/30/2020 13:16    Labs:  Basic Metabolic Panel: No results for input(s): NA, K, CL, CO2, GLUCOSE, BUN, CREATININE, CALCIUM, MG, PHOS in the last 168 hours.   CBC: No results for input(s): WBC, NEUTROABS, HGB, HCT, MCV, PLT in the last 168 hours.   CBG: Recent Labs  Lab 10/06/20 2057 10/07/20 0611 10/07/20 1212 10/07/20 1638 10/07/20 2012  GLUCAP 230* 79 150* 137* 173*   Family history.  Mother with hypertension Father with CVA.  Denies any colon cancer esophageal cancer or rectal cancer Brief HPI:   Troy Rush is a 50 y.o. right-handed male with history of diastolic congestive heart failure hypertension atrial fibrillation nonischemic cardiomyopathy with ICD diabetes mellitus deafness and mutism as well as history of DVT/pulmonary emboli on Eliquis, CKD stage IV OSA.  Presented to the ED with dizziness and right side numbness found to have left subcortical CVA due to small vessel disease per neurology services.  CT of the head showed no abnormality and MRI was unobtainable due to ICD placement.  Not a candidate for anticoagulation and patient remained on Eliquis.  Due to decreased functional ability patient was admitted for a comprehensive rehab program   Hospital Course: Troy Rush was admitted to rehab 10/03/2020 for inpatient therapies to consist of PT, ST and OT at least three hours five days a week. Past admission physiatrist, therapy team and rehab RN have worked together to provide customized collaborative inpatient rehab.  Pertaining to patient's left subcortical CVA remained stable continue on Eliquis therapy for both CVA prophylaxis well with history of DVT/pulmonary emboli.  No bleeding episodes.  Cardiac rate controlled remained on amiodarone.  Blood pressure  controlled on Coreg as well as Lasix with hydralazine and Imdur.  No chest pain or shortness of breath no signs of fluid overload.  Mood stabilization with Zoloft emotional support provided.  Patient did have history of diabetes mellitus Lantus insulin as advised diabetic teaching.  Lipitor ongoing for hyperlipidemia.CKD stage 4 creatinine 2.56   Blood pressures were monitored on TID basis and controlled  Diabetes has been monitored with ac/hs CBG checks and SSI was use prn for tighter BS control.    Rehab course: During patient's stay in rehab weekly team conferences were held to monitor patient's progress, set goals and discuss barriers to discharge. At admission, patient required minimal assist rolling walker minimal assist sit to stand  Physical exam 120/60 pulse 80 temperature 98 respirations 18 oxygen saturation 98% room air Constitutional.  No acute distress HEENT Head.  Normocephalic and atraumatic Eyes.  Pupils round and reactive to light no discharge.nystagmus Neck.  Supple nontender no JVD without thyromegaly Cardiac regular rate rhythm any extra sounds or murmur heard Abdomen.  Soft nontender positive bowel sounds without rebound Respiratory effort normal no respiratory distress without wheeze Neurologic.  Alert oriented mute and deaf/congenital.  Able to communicate via ASL very well.  Strength intact except 4/5 right side handgrip hip flexion dorsi flexion  He/She  has had improvement in activity tolerance, balance, postural control as well as ability to compensate for deficits. He/She  has had improvement in functional use RUE/LUE  and RLE/LLE as well as improvement in awareness.  Patient with excellent overall progress discharged amatory level supervision modified independent.  Patient got his belongings for activities daily when homemaking.  Full teaching completed plan discharged home       Disposition: Discharged to home   Diet: Diabetic diet  Special Instructions:  No smoking alcohol or driving  Medications at discharge 1.  Tylenol as needed 2.  Amiodarone 200 mg p.o. twice daily 3.  Eliquis 5 mg p.o. twice daily 4.  Lipitor 80 mg p.o. nightly 5.  Coreg 12.5 mg p.o. twice daily 6.  Bentyl 20 mg twice daily 7.  Pepcid 20 mg p.o. twice daily 8.  Fenofibrate 54 mg p.o. every morning 9.  Lasix 40 mg p.o. daily 10.  Hydralazine 100 mg p.o. 3 times daily 11.  Vascepa 2 g p.o. twice daily 12.  Lantus insulin 20 units daily 13.  Imdur 60 mg every morning 14.  Xalatan 1 drop nightly 15.  Antivert 25 mg 3 times daily as needed dizziness 16.  Protonix 40 mg p.o. twice daily 17.  MiraLAX daily hold for loose stools 18.  Zoloft 50 mg p.o. daily  30-35 minutes were spent completing discharge summary and discharge planning Discharge Instructions     Ambulatory referral to Neurology   Complete by: As directed    An appointment is requested in approximately: 4 weeks subcortical CVA   Ambulatory referral to Neurology   Complete by: As directed    An appointment is requested in approximately: 4 weeks subcortical CVA        Follow-up Information     Kirsteins, Luanna Salk, MD Follow up.   Specialty: Physical Medicine and Rehabilitation Why: Office to call for appointment Contact information: [place] [id number] [phone]                 Signed: Cathlyn Parsons 10/08/2020, 5:36 AM

## 2020-10-07 NOTE — Patient Care Conference (Signed)
Inpatient RehabilitationTeam Conference and Plan of Care Update Date: 10/07/2020   Time: 10:39 AM    Patient Name: Troy Rush      Medical Record Number: [id number]  Date of Birth: [birth year] Sex: Male         Room/Bed: 5C07C/5C07C-01 Payor Info: Payor: MEDICARE / Plan: MEDICARE PART A AND B / Product Type: *No Product type* /    Admit Date/Time:  10/03/2020  3:59 PM  Primary Diagnosis:  Subcortical infarction [facility])  Hospital Problems: Principal Problem:   Subcortical infarction [facility])    Expected Discharge Date: Expected Discharge Date: 10/08/20  Team Members Present: Physician leading conference: Dr. Alysia Penna Care Coodinator Present: Dorien Chihuahua, RN, BSN, CRRN;Christina Byram, Startup Nurse Present: Dorien Chihuahua, RN PT Present: Page Spiro, PT OT Present: Clyda Greener, OT PPS Coordinator present : Gunnar Fusi, SLP     Current Status/Progress Goal Weekly Team Focus  Bowel/Bladder   patient is continent x2 LBM 10/06/20  patient will continue to be continent  Assess qshift and prn   Swallow/Nutrition/ Hydration             ADL's   Supervision mobility and transfers without AD, Supervision bathing/dressing, Supervision shower transfers  Independent  shower transfers (equipment TBD), IADLs, dynamic balance, RUE fine motor control   Mobility   S overall for mobility, gait >200' no device  I in home, S community  high level balance, dynamic gait, multitasking   Communication             Safety/Cognition/ Behavioral Observations            Pain   Denies pain  Patient will continue to be pain free  Assess qshift and prn   Skin   Skin is intact  Skin will remain intact  Assess qshift     Discharge Planning:  Pt plans to d/c home MOD I, daughter reports planning to move in with pt   Team Discussion: Doing well post stroke. Patient on target to meet rehab goals: yes, on target to meet goals of supervision to mod I. Mod I for bathing and dressing and  ambulation with assistive device.  *See Care Plan and progress notes for long and short-term goals.   Revisions to Treatment Plan:    Teaching Needs: Medications, secondary stroke risk management, etc.  Current Barriers to Discharge: Decreased caregiver support  Possible Resolutions to Barriers: Daughter to assist as needed     Medical Summary Current Status: BPs elevated , cont bowel and bladder no safety issues  Barriers to Discharge: Medical stability   Possible Resolutions to Barriers/Weekly Focus: adjust BP meds, identify caregivers   Continued Need for Acute Rehabilitation Level of Care: The patient requires daily medical management by a physician with specialized training in physical medicine and rehabilitation for the following reasons: Direction of a multidisciplinary physical rehabilitation program to maximize functional independence : Yes Medical management of patient stability for increased activity during participation in an intensive rehabilitation regime.: Yes Analysis of laboratory values and/or radiology reports with any subsequent need for medication adjustment and/or medical intervention. : Yes   I attest that I was present, lead the team conference, and concur with the assessment and plan of the team.   Dorien Chihuahua B 10/07/2020, 1:59 PM

## 2020-10-07 NOTE — Progress Notes (Signed)
Patient ID: Troy Rush, male   DOB: 11/10/1970, 50 y.o.   MRN: [id number] Team Conference Report to Patient/Family  Team Conference discussion was reviewed with the patient and caregiver, including goals, any changes in plan of care and target discharge date.  Patient and caregiver express understanding and are in agreement.  The patient has a target discharge date of 10/08/20.  SW met with pt. Provided conference info. Pt ready for d/c  Dyanne Iha 10/07/2020, 2:13 PM

## 2020-10-08 LAB — GLUCOSE, CAPILLARY: Glucose-Capillary: 100 mg/dL — ABNORMAL HIGH (ref 70–99)

## 2020-10-08 NOTE — Progress Notes (Signed)
INPATIENT REHABILITATION DISCHARGE NOTE   Discharge instructions by: Linna Hoff PA  Verbalized understanding:yes  Skin care/Wound care:none  Pain:none  IV's:none  Tubes/Drains:none  Safety instructions:none  Patient belongings:with pt  Discharged [id number]  Discharged [id number] accompanied by interpreter, RN and NT Notes: Education done re: low sodium diet, diabetes; exercise Maintaining Daisy level with interpreter. No questions noted.

## 2020-10-08 NOTE — Progress Notes (Signed)
Inpatient Rehabilitation Care Coordinator Discharge Note  The overall goal for the admission was met for:   Discharge location: Yes, home  Length of Stay: Yes, 5 Days  Discharge activity level: Yes, MOD I  Home/community participation: Yes  Services provided included: MD, RD, PT, OT, SLP, RN, CM, TR, Pharmacy, Neuropsych, and SW  Financial Services: Medicare  Choices offered to/list presented to:n/a  Follow-up services arranged: Other: NO FOLLOW UP  Comments (or additional information): Bedside Commode, Rolling Walker  Patient/Family verbalized understanding of follow-up arrangements: Yes  Individual responsible for coordination of the follow-up plan: Liatu [phone]  Confirmed correct DME delivered: Dyanne Iha 10/08/2020    Dyanne Iha

## 2020-10-08 NOTE — Progress Notes (Signed)
Patient ID: Troy Rush, male   DOB: [birth year], 50 y.o.   MRN: [id number] Follow up on patient regarding pending discharge and education. Nurse reviewed information with the patient via interpreter and patient noted an understanding of secondary stroke risk management including DM, HTN, HF, HLD. Patient noted an understanding of medications and low salt diet. Marland Kitchenme

## 2020-10-09 ENCOUNTER — Encounter: Payer: Self-pay | Admitting: Physical Medicine & Rehabilitation

## 2020-10-12 ENCOUNTER — Telehealth (HOSPITAL_COMMUNITY): Payer: Self-pay

## 2020-10-12 NOTE — Telephone Encounter (Signed)
I had sch with pt a home visit for today post d/c from [facility]. He reports he is not home, will not be back until later tomor and wants me to come out on Wednesday. Advised him it would be late in the day Wednesday, he seemed ok with that. Plan to see him then.   [name], EMT-Paramedic  10/12/20

## 2020-10-13 ENCOUNTER — Telehealth: Payer: Self-pay

## 2020-10-13 NOTE — Telephone Encounter (Signed)
Transition Care Management Unsuccessful Follow-up Telephone Call  Date of discharge and from where:  [facility] 10/03/2020  Attempts:  1st Attempt  Reason for unsuccessful TCM follow-up call:  Unable to reach patient , Called daughter and she is not in patients care atm , the number on file was language barrier , daughter stated she was with him at the hospital but is not with the patient now as she is out of town and the patient stayed home she wasn't able to provide me any information when I tried to follow up to see how the patient is doing at home.

## 2020-10-13 NOTE — Progress Notes (Signed)
Remote ICD transmission.   

## 2020-10-14 ENCOUNTER — Other Ambulatory Visit: Payer: Self-pay

## 2020-10-14 ENCOUNTER — Encounter: Payer: Self-pay | Admitting: Nurse Practitioner

## 2020-10-14 ENCOUNTER — Ambulatory Visit (INDEPENDENT_AMBULATORY_CARE_PROVIDER_SITE_OTHER): Payer: Medicare Other | Admitting: Nurse Practitioner

## 2020-10-14 ENCOUNTER — Other Ambulatory Visit (HOSPITAL_COMMUNITY): Payer: Self-pay

## 2020-10-14 VITALS — BP 112/57 | HR 78 | Temp 97.3°F | Ht 70.0 in | Wt 229.0 lb

## 2020-10-14 DIAGNOSIS — I5022 Chronic systolic (congestive) heart failure: Secondary | ICD-10-CM | POA: Diagnosis not present

## 2020-10-14 DIAGNOSIS — I10 Essential (primary) hypertension: Secondary | ICD-10-CM | POA: Diagnosis not present

## 2020-10-14 DIAGNOSIS — Z794 Long term (current) use of insulin: Secondary | ICD-10-CM | POA: Diagnosis not present

## 2020-10-14 DIAGNOSIS — I6330 Cerebral infarction due to thrombosis of unspecified cerebral artery: Secondary | ICD-10-CM | POA: Diagnosis not present

## 2020-10-14 DIAGNOSIS — E118 Type 2 diabetes mellitus with unspecified complications: Secondary | ICD-10-CM | POA: Diagnosis not present

## 2020-10-14 DIAGNOSIS — N184 Chronic kidney disease, stage 4 (severe): Secondary | ICD-10-CM | POA: Diagnosis not present

## 2020-10-14 MED ORDER — LANTUS SOLOSTAR 100 UNIT/ML ~~LOC~~ SOPN: 35.0000 [IU] | PEN_INJECTOR | Freq: Two times a day (BID) | SUBCUTANEOUS | 11 refills | Status: DC

## 2020-10-14 NOTE — Progress Notes (Signed)
Integrated Behavioral Health Case Management Referral Note  10/14/2020 Name: Troy Rush MRN: [id number] DOB: 1970/09/07 Troy Rush is a 50 y.o. year old male who sees Vevelyn Francois, NP for primary care. LCSW was consulted to assess patient's needs and assist the patient with Financial Difficulties related to low income and overdue utility bills .  Interpreter: Yes.     Interpreter Name & Language: sign language, [place] video interpreter  Assessment: Patient experiencing  financial difficulties related to low income . Patient has past due utilities including electricity and internet and is falling behind on rent. He reports that he works as a Training and development officer at [employer]. He does not have work during the summer months. He receives SSDI but it was reduced since he is also working. Patient is deaf and needs electricity and internet to use devices he uses for communication.  Intervention: CSW provided resources where patient can apply for rental and utility assistance including [employer]. Patient is aware of [employer] and plans to go there tomorrow to apply. Advised patient to follow up with CSW once he has applied with these resources. CSW also contacting [employer] and [employer] Studies for additional support.  Review of patient status, including review of consultants reports, relevant laboratory and other test results, and collaboration with appropriate care team members and the patient's provider was performed as part of comprehensive patient evaluation and provision of services.    Estanislado Emms, [facility] [phone]

## 2020-10-14 NOTE — Progress Notes (Signed)
Paramedicine Encounter    Patient ID: Troy Rush, male    DOB: 1970/11/01, 50 y.o.   MRN: [id number]   Patient Care Team: Vevelyn Francois, NP as PCP - General (Adult Health Nurse Practitioner) Larey Dresser, MD as PCP - Cardiology (Cardiology) Conrad Bishop, NP as Nurse Practitioner (Cardiology) Larey Dresser, MD as Consulting Physician (Cardiology) Jorge Ny, LCSW as Social Worker (Licensed Clinical Social Worker)  Patient Active Problem List   Diagnosis Date Noted   Subcortical infarction [facility]) 10/03/2020   Cerebral thrombosis with cerebral infarction 10/01/2020   Unilateral weakness 09/30/2020   History of atrial fibrillation 09/30/2020   History of DVT (deep vein thrombosis) 09/30/2020   CKD (chronic kidney disease) stage 3, GFR 30-59 ml/min ([place]) 07/08/2020   Acute CHF (congestive heart failure) ([place]) 07/07/2020   CKD (chronic kidney disease), stage IV ([place]) 07/07/2020   ICD (implantable cardioverter-defibrillator) in place 05/14/2020   Pulmonary embolism ([place]) 02/05/2020   Acute kidney injury superimposed on CKD ([place])    Chronic combined systolic and diastolic heart failure ([place])    Atrial fibrillation with RVR ([place]) 02/01/2020   NSVT (nonsustained ventricular tachycardia) ([place]) 02/01/2020   Acute GI bleeding 02/01/2020   DKA (diabetic ketoacidosis) ([facility]) 02/01/2020   Bilateral deafness 08/15/2019   Type 2 diabetes mellitus with complication, with long-term current use of insulin ([place]) 08/15/2019   Hemoglobin A1C between 7% and 9% indicating borderline diabetic control 08/15/2019   Insomnia 08/15/2019   Abdominal pain 11/14/2018   Ischemic cardiomyopathy 10/14/2016   NICM (nonischemic cardiomyopathy) ([place]) 123XX123   Chronic systolic CHF (congestive heart failure) ([place]) 05/30/2016   HTN (hypertension) 05/30/2016   Deaf 05/30/2016   Hyperkalemia 05/30/2016   Major depressive disorder, recurrent episode ([place]) 03/21/2016   Bronchitis 01/21/2015    Dyslipidemia 01/21/2015   Vitamin D deficiency 12/19/2011   Personal history of other infectious and parasitic diseases 10/25/2011   Anxiety disorder 05/12/2011   Hyperlipidemia 05/12/2011   Hypertriglyceridemia 05/12/2011   Obstructive sleep apnea 05/12/2011    Current Outpatient Medications:    acetaminophen (TYLENOL) 325 MG tablet, Take 2 tablets (650 mg total) by mouth every 4 (four) hours as needed for mild pain (or temp > 37.5 C (99.5 F))., Disp: , Rfl:    amiodarone (PACERONE) 200 MG tablet, Take 1 tablet (200 mg total) by mouth 2 (two) times daily., Disp: 60 tablet, Rfl: 6   apixaban (ELIQUIS) 5 MG TABS tablet, TAKE 1 TABLET(5 MG) BY MOUTH TWICE DAILY, Disp: 60 tablet, Rfl: 11   atorvastatin (LIPITOR) 80 MG tablet, Take 1 tablet (80 mg total) by mouth at bedtime., Disp: 30 tablet, Rfl: 11   carvedilol (COREG) 12.5 MG tablet, Take 1 tablet (12.5 mg total) by mouth 2 (two) times daily., Disp: 60 tablet, Rfl: 11   fenofibrate 54 MG tablet, Take 1 tablet (54 mg total) by mouth daily., Disp: 90 tablet, Rfl: 0   furosemide (LASIX) 20 MG tablet, Take 2 tablets (40 mg total) by mouth daily., Disp: 60 tablet, Rfl: 0   hydrALAZINE (APRESOLINE) 100 MG tablet, Take 1 tablet (100 mg total) by mouth 3 (three) times daily., Disp: 90 tablet, Rfl: 0   insulin glargine (LANTUS SOLOSTAR) 100 UNIT/ML Solostar Pen, Inject 35 Units into the skin 2 (two) times daily., Disp: 15 mL, Rfl: 11   isosorbide mononitrate (IMDUR) 60 MG 24 hr tablet, Take 1 tablet (60 mg total) by mouth daily., Disp: 30 tablet, Rfl: 11   latanoprost (XALATAN) 0.005 %  ophthalmic solution, Place 1 drop into both eyes at bedtime., Disp: , Rfl:    meclizine (ANTIVERT) 25 MG tablet, Take 1 tablet (25 mg total) by mouth 3 (three) times daily as needed for dizziness., Disp: 90 tablet, Rfl: 3   pantoprazole (PROTONIX) 40 MG tablet, Take 1 tablet (40 mg total) by mouth 2 (two) times daily., Disp: 180 tablet, Rfl: 3   polyethylene glycol  (MIRALAX / GLYCOLAX) 17 g packet, Take 17 g by mouth daily., Disp: 14 each, Rfl: 0   sertraline (ZOLOFT) 50 MG tablet, Take 1 tablet (50 mg total) by mouth daily., Disp: 30 tablet, Rfl: 0   VASCEPA 1 g capsule, Take 2 capsules (2 g total) by mouth 2 (two) times daily., Disp: 120 capsule, Rfl: 5   dicyclomine (BENTYL) 20 MG tablet, Take 1 tablet (20 mg total) by mouth 2 (two) times daily. (Patient not taking: Reported on 10/14/2020), Disp: 60 tablet, Rfl: 0   famotidine (PEPCID) 20 MG tablet, Take 1 tablet (20 mg total) by mouth 2 (two) times daily. (Patient not taking: Reported on 10/14/2020), Disp: 30 tablet, Rfl: 0   sucralfate (CARAFATE) 1 g tablet, Take 1 tablet (1 g total) by mouth 4 (four) times daily -  with meals and at bedtime. (Patient not taking: Reported on 10/14/2020), Disp: 21 tablet, Rfl: 0 Allergies  Allergen Reactions   Lisinopril Cough      Social History   Socioeconomic History   Marital status: Legally Separated    Spouse name: Not on file   Number of children: Not on file   Years of education: Not on file   Highest education level: Not on file  Occupational History   Not on file  Tobacco Use   Smoking status: Never   Smokeless tobacco: Never  Vaping Use   Vaping Use: Never used  Substance and Sexual Activity   Alcohol use: Yes    Alcohol/week: 1.0 - 2.0 standard drink    Types: 1 - 2 Glasses of wine per week    Comment: rare   Drug use: No   Sexual activity: Yes  Other Topics Concern   Not on file  Social History Narrative   Not on file   Social Determinants of Health   Financial Resource Strain: Medium Risk   Difficulty of Paying Living Expenses: Somewhat hard  Food Insecurity: No Food Insecurity   Worried About Charity fundraiser in the Last Year: Never true   Ran Out of Food in the Last Year: Never true  Transportation Needs: No Transportation Needs   Lack of Transportation (Medical): No   Lack of Transportation (Non-Medical): No  Physical  Activity: Not on file  Stress: Not on file  Social Connections: Not on file  Intimate Partner Violence: Not on file    Physical Exam      Future Appointments  Date Time Provider [place]  10/27/2020  3:30 PM [facility] PA/NP [facility] None  11/10/2020  2:15 PM Kirsteins, Luanna Salk, MD [facility] CPR  11/12/2020 11:20 AM Larey Dresser, MD [facility] None  11/18/2020  1:15 PM Frann Rider, NP [facility] None  12/11/2020  1:40 PM Vevelyn Francois, NP [place] None  12/21/2020  8:35 AM [facility] DEVICE REMOTES [facility]  03/22/2021  8:35 AM [facility] DEVICE REMOTES [facility]  06/21/2021  8:35 AM [facility] DEVICE REMOTES [facility]  09/20/2021  8:35 AM [facility] DEVICE REMOTES [facility]  12/20/2021  8:35 AM [facility] DEVICE REMOTES [facility]  BP 132/80   Pulse 84   Resp 18   Wt 229 lb (103.9 kg)   SpO2 99%   BMI 32.86 kg/m   Weight yesterday-? Last visit weight-231  Pt home from hosp last week, post stroke. He was not home past 2 days so I couldn't come out for a visit for med rec.   He still does not have his cpap machine. -I will call [facility] again to f/u-[phone]  Pt now has a new roommate, but it comes with some issues-he is moving his wife in as well but they argue and keep him up at night etc. Plus they have a pet.  His SSDI has been cut back to $400 monthly due to overpayment when he worked too much and made too much.  I have advised him to follow the directions on the appeal and file that ASAP.   Refills needed-hydralazine, lantus  Does not have the zoloft yet nor the carafate [place] was stopped-- Contacted pharmacy to get those ordered.  Pt seems to be doing pretty good since his stroke. He is able to walk ok, he seems a little weak in general.  Meds verified and 2 pill boxes refilled. I will go back out next week for f/u.   [name],  [facility] Paramedic  10/15/20

## 2020-10-14 NOTE — Progress Notes (Signed)
[address], [place]  [address] Phone:  [phone]   Fax:  [phone]   Established Patient Office Visit  Subjective:  Patient ID: Troy Rush, male    DOB: [birth year]  Age: 50 y.o. MRN: [id number]  CC:  Chief Complaint  Patient presents with   Hospitalization Follow-up    Was hospitalized twice. Had a stoke.  Feeling okay today, no questions or concerns.      HPI Troy Rush presents for follow up. A former patient of NP Stroud. He  has a past medical history of AICD (automatic cardioverter/defibrillator) present (10/14/2016), Bilateral deafness, Bronchitis, CHF (congestive heart failure) ([place]), Depression, Diabetes mellitus without complication ([place]), DVT, lower extremity ([place]) (07/12/2019), Dyslipidemia, Headache (08/2019), Hypertension, ICD (implantable cardioverter-defibrillator) in place, Insomnia (08/2019), NICM (nonischemic cardiomyopathy) ([place]) (09/15/2016), Pancreatitis, Sleep apnea, and Vitamin D deficiency (08/2019).    Diabetes Mellitus Patient presents for follow up of diabetes. Current symptoms include: hyperglycemia. Symptoms have gradually worsened. Patient denies foot ulcerations, nausea, and vomiting. Evaluation to date has included: fasting blood sugar and hemoglobin A1C.  Home sugars: BGs are running  consistent with Hgb A1C. Current treatment: more intensive attention to diet which has been not very effective, Continued insulin which has been not very effective, Continued statin which has been unable to assess effectiveness, and Continued carvedilol furosemide, hydralazine, which has been effective. He reports he had 2 injections in the past to treat his diabetes.  He agrees that he is not consistent with his diet.  He is currently employed as a Biomedical scientist.  He does encounter barriers due to his disability.  Past Medical History:  Diagnosis Date   AICD (automatic cardioverter/defibrillator) present 10/14/2016   Bilateral  deafness    Bronchitis    CHF (congestive heart failure) ([place])    Depression    Diabetes mellitus without complication ([place])    DVT, lower extremity ([place]) 07/12/2019   Dyslipidemia    Headache 08/2019   Hypertension    ICD (implantable cardioverter-defibrillator) in place    Insomnia 08/2019   NICM (nonischemic cardiomyopathy) ([place]) 09/15/2016   Pancreatitis    Sleep apnea    Vitamin D deficiency 08/2019    Past Surgical History:  Procedure Laterality Date   BACK SURGERY     CARDIAC CATHETERIZATION     ESOPHAGOGASTRODUODENOSCOPY (EGD) WITH PROPOFOL N/A 02/02/2020   Procedure: ESOPHAGOGASTRODUODENOSCOPY (EGD) WITH PROPOFOL;  Surgeon: Arta Silence, MD;  Location: [facility];  Service: Endoscopy;  Laterality: N/A;   ICD IMPLANT  10/14/2016   ICD IMPLANT N/A 10/14/2016   Procedure: ICD Implant;  Surgeon: Deboraha Sprang, MD;  Location: [place] CV LAB;  Service: Cardiovascular;  Laterality: N/A;   PRESSURE SENSOR/CARDIOMEMS N/A 09/10/2020   Procedure: PRESSURE SENSOR/CARDIOMEMS;  Surgeon: Larey Dresser, MD;  Location: [place] CV LAB;  Service: Cardiovascular;  Laterality: N/A;   RIGHT HEART CATH N/A 05/21/2020   Procedure: RIGHT HEART CATH;  Surgeon: Larey Dresser, MD;  Location: [place] CV LAB;  Service: Cardiovascular;  Laterality: N/A;   WRIST SURGERY      Family History  Problem Relation Age of Onset   Hypertension Mother    Healthy Father    Stroke Father     Social History   Socioeconomic History   Marital status: Legally Separated    Spouse name: Not on file   Number of children: Not on file   Years of education: Not on file  Highest education level: Not on file  Occupational History   Not on file  Tobacco Use   Smoking status: Never   Smokeless tobacco: Never  Vaping Use   Vaping Use: Never used  Substance and Sexual Activity   Alcohol use: Yes    Alcohol/week: 1.0 - 2.0 standard drink    Types: 1 - 2 Glasses of wine per week    Comment:  rare   Drug use: No   Sexual activity: Yes  Other Topics Concern   Not on file  Social History Narrative   Not on file   Social Determinants of Health   Financial Resource Strain: Medium Risk   Difficulty of Paying Living Expenses: Somewhat hard  Food Insecurity: No Food Insecurity   Worried About Charity fundraiser in the Last Year: Never true   Ran Out of Food in the Last Year: Never true  Transportation Needs: No Transportation Needs   Lack of Transportation (Medical): No   Lack of Transportation (Non-Medical): No  Physical Activity: Not on file  Stress: Not on file  Social Connections: Not on file  Intimate Partner Violence: Not on file    Outpatient Medications Prior to Visit  Medication Sig Dispense Refill   acetaminophen (TYLENOL) 325 MG tablet Take 2 tablets (650 mg total) by mouth every 4 (four) hours as needed for mild pain (or temp > 37.5 C (99.5 F)).     amiodarone (PACERONE) 200 MG tablet Take 1 tablet (200 mg total) by mouth 2 (two) times daily. 60 tablet 6   apixaban (ELIQUIS) 5 MG TABS tablet TAKE 1 TABLET(5 MG) BY MOUTH TWICE DAILY 60 tablet 11   atorvastatin (LIPITOR) 80 MG tablet Take 1 tablet (80 mg total) by mouth at bedtime. 30 tablet 11   carvedilol (COREG) 12.5 MG tablet Take 1 tablet (12.5 mg total) by mouth 2 (two) times daily. 60 tablet 11   dicyclomine (BENTYL) 20 MG tablet Take 1 tablet (20 mg total) by mouth 2 (two) times daily. (Patient not taking: Reported on 10/14/2020) 60 tablet 0   famotidine (PEPCID) 20 MG tablet Take 1 tablet (20 mg total) by mouth 2 (two) times daily. (Patient not taking: Reported on 10/14/2020) 30 tablet 0   fenofibrate 54 MG tablet Take 1 tablet (54 mg total) by mouth daily. 90 tablet 0   furosemide (LASIX) 20 MG tablet Take 2 tablets (40 mg total) by mouth daily. 60 tablet 0   hydrALAZINE (APRESOLINE) 100 MG tablet Take 1 tablet (100 mg total) by mouth 3 (three) times daily. 90 tablet 0   isosorbide mononitrate (IMDUR) 60 MG  24 hr tablet Take 1 tablet (60 mg total) by mouth daily. 30 tablet 11   latanoprost (XALATAN) 0.005 % ophthalmic solution Place 1 drop into both eyes at bedtime.     meclizine (ANTIVERT) 25 MG tablet Take 1 tablet (25 mg total) by mouth 3 (three) times daily as needed for dizziness. 90 tablet 3   pantoprazole (PROTONIX) 40 MG tablet Take 1 tablet (40 mg total) by mouth 2 (two) times daily. 180 tablet 3   polyethylene glycol (MIRALAX / GLYCOLAX) 17 g packet Take 17 g by mouth daily. 14 each 0   sertraline (ZOLOFT) 50 MG tablet Take 1 tablet (50 mg total) by mouth daily. 30 tablet 0   sucralfate (CARAFATE) 1 g tablet Take 1 tablet (1 g total) by mouth 4 (four) times daily -  with meals and at bedtime. (Patient not taking: Reported  on 10/14/2020) 21 tablet 0   VASCEPA 1 g capsule Take 2 capsules (2 g total) by mouth 2 (two) times daily. 120 capsule 5   insulin glargine (LANTUS SOLOSTAR) 100 UNIT/ML Solostar Pen Inject 20 Units into the skin daily. 15 mL 11   No facility-administered medications prior to visit.    Allergies  Allergen Reactions   Lisinopril Cough    ROS Review of Systems  Neurological:  Positive for headaches (lasted a little while/ lives with a couple).     Objective:    Physical Exam Exam conducted with a chaperone present.  Constitutional:      Appearance: He is obese.  HENT:     Head: Normocephalic and atraumatic.     Nose: Nose normal.     Mouth/Throat:     Mouth: Mucous membranes are moist.  Cardiovascular:     Rate and Rhythm: Normal rate and regular rhythm.     Pulses: Normal pulses.     Heart sounds: Normal heart sounds.  Pulmonary:     Effort: Pulmonary effort is normal.     Breath sounds: Normal breath sounds.  Musculoskeletal:        General: Normal range of motion.     Cervical back: Normal range of motion.  Skin:    General: Skin is warm and dry.     Capillary Refill: Capillary refill takes less than 2 seconds.  Neurological:     General: No  focal deficit present.     Mental Status: He is alert and oriented to person, place, and time.   BP (!) 112/57 (BP Location: Left Arm, Patient Position: Sitting)   Pulse 78   Temp (!) 97.3 F (36.3 C)   Ht '5\' 10"'$  (1.778 m)   Wt 229 lb 0.6 oz (103.9 kg)   SpO2 99%   BMI 32.86 kg/m  Wt Readings from Last 3 Encounters:  10/14/20 229 lb (103.9 kg)  10/14/20 229 lb 0.6 oz (103.9 kg)  10/08/20 229 lb 0.9 oz (103.9 kg)     Health Maintenance Due  Topic Date Due   OPHTHALMOLOGY EXAM  05/30/2017    There are no preventive care reminders to display for this patient.  Lab Results  Component Value Date   TSH 1.957 09/10/2020   Lab Results  Component Value Date   WBC 5.3 10/01/2020   HGB 11.2 (L) 10/01/2020   HCT 35.2 (L) 10/01/2020   MCV 91.4 10/01/2020   PLT 115 (L) 10/01/2020   Lab Results  Component Value Date   NA 132 (L) 10/01/2020   K 3.9 10/01/2020   CO2 25 10/01/2020   GLUCOSE 285 (H) 10/01/2020   BUN 32 (H) 10/01/2020   CREATININE 2.56 (H) 10/01/2020   BILITOT 1.3 (H) 10/01/2020   ALKPHOS 49 10/01/2020   AST 28 10/01/2020   ALT 34 10/01/2020   PROT 6.6 10/01/2020   ALBUMIN 3.1 (L) 10/01/2020   CALCIUM 9.4 10/01/2020   ANIONGAP 15 10/01/2020   Lab Results  Component Value Date   CHOL 355 (H) 10/01/2020   Lab Results  Component Value Date   HDL 46 10/01/2020   Lab Results  Component Value Date   LDLCALC UNABLE TO CALCULATE IF TRIGLYCERIDE OVER 400 mg/dL 10/01/2020   Lab Results  Component Value Date   TRIG 471 (H) 10/01/2020   Lab Results  Component Value Date   CHOLHDL 7.7 10/01/2020   Lab Results  Component Value Date   HGBA1C 9.7 (H) 09/30/2020  Assessment & Plan:   Problem List Items Addressed This Visit       Cardiovascular and Mediastinum   Chronic systolic CHF (congestive heart failure) (HCC) (Chronic) Controlled We will continue current management follow-up with cardiology     Endocrine   Type 2 diabetes mellitus with  complication, with long-term current use of insulin ([place]) - Primary Uncontrolled A1c increased from 3 months ago 8.8% however improved from 1 month ago 10.3% currently 9.7% Encourage compliance with current treatment regimen  Dose adjustment Lantus increased to 35 units twice daily due to comorbidities Encourage regular CBG monitoring Encourage contacting office if excessive hyperglycemia and or hypoglycemia Lifestyle modification with healthy diet (fewer calories, more high fiber foods, whole grains and non-starchy vegetables, lower fat meat and fish, low-fat diary include healthy oils) regular exercise (physical activity) and weight loss Opthalmology exam discussed  Nutritional consult recommended Regular dental visits encouraged Home BP monitoring also encouraged goal <130/80    Relevant Medications   insulin glargine (LANTUS SOLOSTAR) 100 UNIT/ML Solostar Pen   Other Visit Diagnoses     Essential hypertension     Stable on current regimen   CKD (chronic kidney disease) stage 4, GFR 15-29 ml/min (HCC)  Persistent Denies previous referral    Referral pending   Relevant Orders   Ambulatory referral to Nephrology       Meds ordered this encounter  Medications   insulin glargine (LANTUS SOLOSTAR) 100 UNIT/ML Solostar Pen    Sig: Inject 35 Units into the skin 2 (two) times daily.    Dispense:  15 mL    Refill:  11    Order Specific Question:   Supervising Provider    Answer:   Tresa Garter [id number]    Follow-up: Return in about 6 weeks (around 11/25/2020) for follow up [id number].    Vevelyn Francois, NP

## 2020-10-14 NOTE — Patient Instructions (Addendum)
Diabetes Mellitus and Nutrition, Adult When you have diabetes, or diabetes mellitus, it is very important to have healthy eating habits because your blood sugar (glucose) levels are greatly affected by what you eat and drink. Eating healthy foods in the right amounts, at about the same times every day, can help you: Control your blood glucose. Lower your risk of heart disease. Improve your blood pressure. Reach or maintain a healthy weight. What can affect my meal plan? Every person with diabetes is different, and each person has different needs for a meal plan. Your health care provider may recommend that you work with a dietitian to make a meal plan that is best for you. Your meal plan may vary depending on factors such as: The calories you need. The medicines you take. Your weight. Your blood glucose, blood pressure, and cholesterol levels. Your activity level. Other health conditions you have, such as heart or kidney disease. How do carbohydrates affect me? Carbohydrates, also called carbs, affect your blood glucose level more than any other type of food. Eating carbs naturally raises the amount of glucose in your blood. Carb counting is a method for keeping track of how many carbs you eat. Counting carbs is important to keep your blood glucose at a healthy level,especially if you use insulin or take certain oral diabetes medicines. It is important to know how many carbs you can safely have in each meal. This is different for every person. Your dietitian can help you calculate how manycarbs you should have at each meal and for each snack. How does alcohol affect me? Alcohol can cause a sudden decrease in blood glucose (hypoglycemia), especially if you use insulin or take certain oral diabetes medicines. Hypoglycemia can be a life-threatening condition. Symptoms of hypoglycemia, such as sleepiness, dizziness, and confusion, are similar to symptoms of having too much alcohol. Do not drink  alcohol if: Your health care provider tells you not to drink. You are pregnant, may be pregnant, or are planning to become pregnant. If you drink alcohol: Do not drink on an empty stomach. Limit how much you use to: 0-1 drink a day for women. 0-2 drinks a day for men. Be aware of how much alcohol is in your drink. In the [place] one drink equals one 12 oz bottle of beer (355 mL), one 5 oz glass of wine (148 mL), or one 1 oz glass of hard liquor (44 mL). Keep yourself hydrated with water, diet soda, or unsweetened iced tea. Keep in mind that regular soda, juice, and other mixers may contain a lot of sugar and must be counted as carbs. What are tips for following this plan?  Reading food labels Start by checking the serving size on the "Nutrition Facts" label of packaged foods and drinks. The amount of calories, carbs, fats, and other nutrients listed on the label is based on one serving of the item. Many items contain more than one serving per package. Check the total grams (g) of carbs in one serving. You can calculate the number of servings of carbs in one serving by dividing the total carbs by 15. For example, if a food has 30 g of total carbs per serving, it would be equal to 2 servings of carbs. Check the number of grams (g) of saturated fats and trans fats in one serving. Choose foods that have a low amount or none of these fats. Check the number of milligrams (mg) of salt (sodium) in one serving. Most people should limit total  sodium intake to less than 2,300 mg per day. Always check the nutrition information of foods labeled as "low-fat" or "nonfat." These foods may be higher in added sugar or refined carbs and should be avoided. Talk to your dietitian to identify your daily goals for nutrients listed on the label. Shopping Avoid buying canned, pre-made, or processed foods. These foods tend to be high in fat, sodium, and added sugar. Shop around the outside edge of the grocery store. This  is where you will most often find fresh fruits and vegetables, bulk grains, fresh meats, and fresh dairy. Cooking Use low-heat cooking methods, such as baking, instead of high-heat cooking methods like deep frying. Cook using healthy oils, such as olive, canola, or sunflower oil. Avoid cooking with butter, cream, or high-fat meats. Meal planning Eat meals and snacks regularly, preferably at the same times every day. Avoid going long periods of time without eating. Eat foods that are high in fiber, such as fresh fruits, vegetables, beans, and whole grains. Talk with your dietitian about how many servings of carbs you can eat at each meal. Eat 4-6 oz (112-168 g) of lean protein each day, such as lean meat, chicken, fish, eggs, or tofu. One ounce (oz) of lean protein is equal to: 1 oz (28 g) of meat, chicken, or fish. 1 egg.  cup (62 g) of tofu. Eat some foods each day that contain healthy fats, such as avocado, nuts, seeds, and fish. What foods should I eat? Fruits Berries. Apples. Oranges. Peaches. Apricots. Plums. Grapes. Mango. Papaya.Pomegranate. Kiwi. Cherries. Vegetables Lettuce. Spinach. Leafy greens, including kale, chard, collard greens, and mustard greens. Beets. Cauliflower. Cabbage. Broccoli. Carrots. Green beans.Tomatoes. Peppers. Onions. Cucumbers. Brussels sprouts. Grains Whole grains, such as whole-wheat or whole-grain bread, crackers, tortillas,cereal, and pasta. Unsweetened oatmeal. Quinoa. Brown or wild rice. Meats and other proteins Seafood. Poultry without skin. Lean cuts of poultry and beef. Tofu. Nuts. Seeds. Dairy Low-fat or fat-free dairy products such as milk, yogurt, and cheese. The items listed above may not be a complete list of foods and beverages you can eat. Contact a dietitian for more information. What foods should I avoid? Fruits Fruits canned with syrup. Vegetables Canned vegetables. Frozen vegetables with butter or cream sauce. Grains Refined white  flour and flour products such as bread, pasta, snack foods, andcereals. Avoid all processed foods. Meats and other proteins Fatty cuts of meat. Poultry with skin. Breaded or fried meats. Processed meat.Avoid saturated fats. Dairy Full-fat yogurt, cheese, or milk. Beverages Sweetened drinks, such as soda or iced tea. The items listed above may not be a complete list of foods and beverages you should avoid. Contact a dietitian for more information. Questions to ask a health care provider Do I need to meet with a diabetes educator? Do I need to meet with a dietitian? What number can I call if I have questions? When are the best times to check my blood glucose? Where to find more information: American Diabetes Association: diabetes.org Academy of Nutrition and Dietetics: www.eatright.[employer] of Diabetes and Digestive and Kidney Diseases: [place] Association of Diabetes Care and Education Specialists: www.diabeteseducator.org Summary It is important to have healthy eating habits because your blood sugar (glucose) levels are greatly affected by what you eat and drink. A healthy meal plan will help you control your blood glucose and maintain a healthy lifestyle. Your health care provider may recommend that you work with a dietitian to make a meal plan that is best for you. Keep in  mind that carbohydrates (carbs) and alcohol have immediate effects on your blood glucose levels. It is important to count carbs and to use alcohol carefully. This information is not intended to replace advice given to you by your health care provider. Make sure you discuss any questions you have with your healthcare provider. Document Revised: 03/05/2019 Document Reviewed: 03/05/2019 Elsevier Patient Education  2021 [place] Your Hypertension Hypertension, also called high blood pressure, is when the force of the blood pressing against the walls of the arteries is too strong.  Arteries are blood vessels that carry blood from your heart throughout your body. Hypertension forces the heart to work harder to pump blood and may cause the arteries tobecome narrow or stiff. Understanding blood pressure readings Your personal target blood pressure may vary depending on your medical conditions, your age, and other factors. A blood pressure reading includes a higher number over a lower number. Ideally, your blood pressure should be below 120/80. You should know that: The first, or top, number is called the systolic pressure. It is a measure of the pressure in your arteries as your heart beats. The second, or bottom number, is called the diastolic pressure. It is a measure of the pressure in your arteries as the heart relaxes. Blood pressure is classified into four stages. Based on your blood pressure reading, your health care provider may use the following stages to determine what type of treatment you need, if any. Systolic pressure and diastolicpressure are measured in a unit called mmHg. Normal Systolic pressure: below 123456. Diastolic pressure: below 80. Elevated Systolic pressure: [id number]. Diastolic pressure: below 80. Hypertension stage 1 Systolic pressure: 0000000. Diastolic pressure: [id number]. Hypertension stage 2 Systolic pressure: [id number] or above. Diastolic pressure: 90 or above. How can this condition affect me? Managing your hypertension is an important responsibility. Over time, hypertension can damage the arteries and decrease blood flow to important parts of the body, including the brain, heart, and kidneys. Having untreated or uncontrolled hypertension can lead to: A heart attack. A stroke. A weakened blood vessel (aneurysm). Heart failure. Kidney damage. Eye damage. Metabolic syndrome. Memory and concentration problems. Vascular dementia. What actions can I take to manage this condition? Hypertension can be managed by making lifestyle changes and possibly by  taking medicines. Your health care provider will help you make a plan to bring yourblood pressure within a normal range. Nutrition  Eat a diet that is high in fiber and potassium, and low in salt (sodium), added sugar, and fat. An example eating plan is called the Dietary Approaches to Stop Hypertension (DASH) diet. To eat this way: Eat plenty of fresh fruits and vegetables. Try to fill one-half of your plate at each meal with fruits and vegetables. Eat whole grains, such as whole-wheat pasta, brown rice, or whole-grain bread. Fill about one-fourth of your plate with whole grains. Eat low-fat dairy products. Avoid fatty cuts of meat, processed or cured meats, and poultry with skin. Fill about one-fourth of your plate with lean proteins such as fish, chicken without skin, beans, eggs, and tofu. Avoid pre-made and processed foods. These tend to be higher in sodium, added sugar, and fat. Reduce your daily sodium intake. Most people with hypertension should eat less than 1,500 mg of sodium a day.  Lifestyle  Work with your health care provider to maintain a healthy body weight or to lose weight. Ask what an ideal weight is for you. Get at least 30 minutes of exercise that causes your heart to  beat faster (aerobic exercise) most days of the week. Activities may include walking, swimming, or biking. Include exercise to strengthen your muscles (resistance exercise), such as weight lifting, as part of your weekly exercise routine. Try to do these types of exercises for 30 minutes at least 3 days a week. Do not use any products that contain nicotine or tobacco, such as cigarettes, e-cigarettes, and chewing tobacco. If you need help quitting, ask your health care provider. Control any long-term (chronic) conditions you have, such as high cholesterol or diabetes. Identify your sources of stress and find ways to manage stress. This may include meditation, deep breathing, or making time for fun  activities.  Alcohol use Do not drink alcohol if: Your health care provider tells you not to drink. You are pregnant, may be pregnant, or are planning to become pregnant. If you drink alcohol: Limit how much you use to: 0-1 drink a day for women. 0-2 drinks a day for men. Be aware of how much alcohol is in your drink. In the [place] one drink equals one 12 oz bottle of beer (355 mL), one 5 oz glass of wine (148 mL), or one 1 oz glass of hard liquor (44 mL). Medicines Your health care provider may prescribe medicine if lifestyle changes are not enough to get your blood pressure under control and if: Your systolic blood pressure is 130 or higher. Your diastolic blood pressure is 80 or higher. Take medicines only as told by your health care provider. Follow the directions carefully. Blood pressure medicines must be taken as told by your health care provider. The medicine does not work as well when you skip doses. Skippingdoses also puts you at risk for problems. Monitoring Before you monitor your blood pressure: Do not smoke, drink caffeinated beverages, or exercise within 30 minutes before taking a measurement. Use the bathroom and empty your bladder (urinate). Sit quietly for at least 5 minutes before taking measurements. Monitor your blood pressure at home as told by your health care provider. To do this: Sit with your back straight and supported. Place your feet flat on the floor. Do not cross your legs. Support your arm on a flat surface, such as a table. Make sure your upper arm is at heart level. Each time you measure, take two or three readings one minute apart and record the results. You may also need to have your blood pressure checked regularly by your healthcare provider. General information Talk with your health care provider about your diet, exercise habits, and other lifestyle factors that may be contributing to hypertension. Review all the medicines you take with your health  care provider because there may be side effects or interactions. Keep all visits as told by your health care provider. Your health care provider can help you create and adjust your plan for managing your high blood pressure. Where to find more information National Heart, Lung, and Blood Institute: https://wilson-eaton.com/ American Heart Association: www.heart.org Contact a health care provider if: You think you are having a reaction to medicines you have taken. You have repeated (recurrent) headaches. You feel dizzy. You have swelling in your ankles. You have trouble with your vision. Get help right away if: You develop a severe headache or confusion. You have unusual weakness or numbness, or you feel faint. You have severe pain in your chest or abdomen. You vomit repeatedly. You have trouble breathing. These symptoms may represent a serious problem that is an emergency. Do not wait to see if the  symptoms will go away. Get medical help right away. Call your local emergency services (911 in the [place] Do not drive yourself to the hospital. Summary Hypertension is when the force of blood pumping through your arteries is too strong. If this condition is not controlled, it may put you at risk for serious complications. Your personal target blood pressure may vary depending on your medical conditions, your age, and other factors. For most people, a normal blood pressure is less than 120/80. Hypertension is managed by lifestyle changes, medicines, or both. Lifestyle changes to help manage hypertension include losing weight, eating a healthy, low-sodium diet, exercising more, stopping smoking, and limiting alcohol. This information is not intended to replace advice given to you by your health care provider. Make sure you discuss any questions you have with your healthcare provider. Document Revised: 05/03/2019 Document Reviewed: 02/26/2019 Elsevier Patient Education  2022 [employer].

## 2020-10-15 ENCOUNTER — Ambulatory Visit: Payer: Medicare Other | Admitting: Nurse Practitioner

## 2020-10-21 ENCOUNTER — Other Ambulatory Visit (HOSPITAL_COMMUNITY): Payer: Self-pay

## 2020-10-22 ENCOUNTER — Encounter: Payer: Self-pay | Admitting: *Deleted

## 2020-10-22 NOTE — Telephone Encounter (Addendum)
Reached out to patient and spoke to the answering service who took my message to inform the patient about his sleep study and would have him to call our office. We have not heard from the patient at all.   Reached out to patients daughter who will inform the patient that we have been trying to reach him concerning his sleep study and treatment.  Reached out to the patient again and got the sign language answering service. The answering service interpreted my message to the patient and I will [facility] order [facility].

## 2020-10-22 NOTE — Progress Notes (Signed)
Paramedicine Encounter    Patient ID: Troy Rush, male    DOB: February 11, 1971, 50 y.o.   MRN: [id number]   Patient Care Team: Vevelyn Francois, NP as PCP - General (Adult Health Nurse Practitioner) Larey Dresser, MD as PCP - Cardiology (Cardiology) Conrad [name], NP as Nurse Practitioner (Cardiology) Larey Dresser, MD as Consulting Physician (Cardiology) Jorge Ny, LCSW as Social Worker (Licensed Clinical Social Worker)  Patient Active Problem List   Diagnosis Date Noted   Subcortical infarction [facility]) 10/03/2020   Cerebral thrombosis with cerebral infarction 10/01/2020   Unilateral weakness 09/30/2020   History of atrial fibrillation 09/30/2020   History of DVT (deep vein thrombosis) 09/30/2020   CKD (chronic kidney disease) stage 3, GFR 30-59 ml/min ([place]) 07/08/2020   Acute CHF (congestive heart failure) ([place]) 07/07/2020   CKD (chronic kidney disease), stage IV ([place]) 07/07/2020   ICD (implantable cardioverter-defibrillator) in place 05/14/2020   Pulmonary embolism ([place]) 02/05/2020   Acute kidney injury superimposed on CKD ([place])    Chronic combined systolic and diastolic heart failure ([place])    Atrial fibrillation with RVR ([place]) 02/01/2020   NSVT (nonsustained ventricular tachycardia) ([place]) 02/01/2020   Acute GI bleeding 02/01/2020   DKA (diabetic ketoacidosis) ([place]) 02/01/2020   Bilateral deafness 08/15/2019   Type 2 diabetes mellitus with complication, with long-term current use of insulin ([place]) 08/15/2019   Hemoglobin A1C between 7% and 9% indicating borderline diabetic control 08/15/2019   Insomnia 08/15/2019   Abdominal pain 11/14/2018   Ischemic cardiomyopathy 10/14/2016   NICM (nonischemic cardiomyopathy) ([place]) [id number]   Chronic systolic CHF (congestive heart failure) ([place]) 05/30/2016   HTN (hypertension) 05/30/2016   Deaf 05/30/2016   Hyperkalemia 05/30/2016   Major depressive disorder, recurrent episode ([place]) 03/21/2016   Bronchitis 01/21/2015    Dyslipidemia 01/21/2015   Vitamin D deficiency 12/19/2011   Personal history of other infectious and parasitic diseases 10/25/2011   Anxiety disorder 05/12/2011   Hyperlipidemia 05/12/2011   Hypertriglyceridemia 05/12/2011   Obstructive sleep apnea 05/12/2011    Current Outpatient Medications:    acetaminophen (TYLENOL) 325 MG tablet, Take 2 tablets (650 mg total) by mouth every 4 (four) hours as needed for mild pain (or temp > 37.5 C (99.5 F))., Disp: , Rfl:    amiodarone (PACERONE) 200 MG tablet, Take 1 tablet (200 mg total) by mouth 2 (two) times daily., Disp: 60 tablet, Rfl: 6   apixaban (ELIQUIS) 5 MG TABS tablet, TAKE 1 TABLET(5 MG) BY MOUTH TWICE DAILY, Disp: 60 tablet, Rfl: 11   atorvastatin (LIPITOR) 80 MG tablet, Take 1 tablet (80 mg total) by mouth at bedtime., Disp: 30 tablet, Rfl: 11   carvedilol (COREG) 12.5 MG tablet, Take 1 tablet (12.5 mg total) by mouth 2 (two) times daily., Disp: 60 tablet, Rfl: 11   fenofibrate 54 MG tablet, Take 1 tablet (54 mg total) by mouth daily., Disp: 90 tablet, Rfl: 0   furosemide (LASIX) 20 MG tablet, Take 2 tablets (40 mg total) by mouth daily., Disp: 60 tablet, Rfl: 0   hydrALAZINE (APRESOLINE) 100 MG tablet, Take 1 tablet (100 mg total) by mouth 3 (three) times daily., Disp: 90 tablet, Rfl: 0   insulin glargine (LANTUS SOLOSTAR) 100 UNIT/ML Solostar Pen, Inject 35 Units into the skin 2 (two) times daily., Disp: 15 mL, Rfl: 11   isosorbide mononitrate (IMDUR) 60 MG 24 hr tablet, Take 1 tablet (60 mg total) by mouth daily., Disp: 30 tablet, Rfl: 11   latanoprost (XALATAN) 0.005 %  ophthalmic solution, Place 1 drop into both eyes at bedtime., Disp: , Rfl:    meclizine (ANTIVERT) 25 MG tablet, Take 1 tablet (25 mg total) by mouth 3 (three) times daily as needed for dizziness., Disp: 90 tablet, Rfl: 3   pantoprazole (PROTONIX) 40 MG tablet, Take 1 tablet (40 mg total) by mouth 2 (two) times daily., Disp: 180 tablet, Rfl: 3   polyethylene glycol  (MIRALAX / GLYCOLAX) 17 g packet, Take 17 g by mouth daily., Disp: 14 each, Rfl: 0   sertraline (ZOLOFT) 50 MG tablet, Take 1 tablet (50 mg total) by mouth daily., Disp: 30 tablet, Rfl: 0   sucralfate (CARAFATE) 1 g tablet, Take 1 tablet (1 g total) by mouth 4 (four) times daily -  with meals and at bedtime., Disp: 21 tablet, Rfl: 0   VASCEPA 1 g capsule, Take 2 capsules (2 g total) by mouth 2 (two) times daily., Disp: 120 capsule, Rfl: 5   dicyclomine (BENTYL) 20 MG tablet, Take 1 tablet (20 mg total) by mouth 2 (two) times daily. (Patient not taking: No sig reported), Disp: 60 tablet, Rfl: 0   famotidine (PEPCID) 20 MG tablet, Take 1 tablet (20 mg total) by mouth 2 (two) times daily. (Patient not taking: No sig reported), Disp: 30 tablet, Rfl: 0 Allergies  Allergen Reactions   Lisinopril Cough      Social History   Socioeconomic History   Marital status: Legally Separated    Spouse name: Not on file   Number of children: Not on file   Years of education: Not on file   Highest education level: Not on file  Occupational History   Not on file  Tobacco Use   Smoking status: Never   Smokeless tobacco: Never  Vaping Use   Vaping Use: Never used  Substance and Sexual Activity   Alcohol use: Yes    Alcohol/week: 1.0 - 2.0 standard drink    Types: 1 - 2 Glasses of wine per week    Comment: rare   Drug use: No   Sexual activity: Yes  Other Topics Concern   Not on file  Social History Narrative   Not on file   Social Determinants of Health   Financial Resource Strain: Medium Risk   Difficulty of Paying Living Expenses: Somewhat hard  Food Insecurity: No Food Insecurity   Worried About Charity fundraiser in the Last Year: Never true   Ran Out of Food in the Last Year: Never true  Transportation Needs: No Transportation Needs   Lack of Transportation (Medical): No   Lack of Transportation (Non-Medical): No  Physical Activity: Not on file  Stress: Not on file  Social  Connections: Not on file  Intimate Partner Violence: Not on file    Physical Exam      Future Appointments  Date Time Provider [place]  10/27/2020  3:30 PM [facility] PA/NP [facility] None  11/10/2020  2:15 PM Kirsteins, Luanna Salk, MD [facility] CPR  11/12/2020 11:20 AM Larey Dresser, MD [facility] None  11/18/2020  1:15 PM Frann Rider, NP [facility] None  12/11/2020  1:40 PM Vevelyn Francois, NP [place] None  12/21/2020  8:35 AM [facility] DEVICE REMOTES [facility]  03/22/2021  8:35 AM [facility] DEVICE REMOTES [facility]  06/21/2021  8:35 AM [facility] DEVICE REMOTES [facility]  09/20/2021  8:35 AM [facility] DEVICE REMOTES [facility]  12/20/2021  8:35 AM [facility] DEVICE REMOTES [facility]    BP 114/60   Pulse  81   Resp 18   Weight yesterday-? Last visit weight-229  Pt was sitting outside on stoop eating corn when I arrived. He denies any complaints.  Meds verified and 2 pill boxes refilled. His roommate advised that he has been drinking alcohol recently.  He just started his zoloft and carafate today. No edema noted. His b/p is much improved. No dizziness, no c/p, no increased sob. Will see him in 2 wks.   [name], [facility] Paramedic  10/22/20

## 2020-10-27 ENCOUNTER — Encounter (HOSPITAL_COMMUNITY): Payer: Self-pay

## 2020-10-27 ENCOUNTER — Ambulatory Visit (HOSPITAL_COMMUNITY)
Admit: 2020-10-27 | Discharge: 2020-10-27 | Disposition: A | Discharge status: Home / Self Care | Payer: Medicare Other | Attending: Cardiology | Admitting: Cardiology

## 2020-10-27 ENCOUNTER — Other Ambulatory Visit (HOSPITAL_COMMUNITY): Payer: Self-pay

## 2020-10-27 ENCOUNTER — Other Ambulatory Visit: Payer: Self-pay

## 2020-10-27 VITALS — BP 154/78 | HR 80 | Wt 234.6 lb

## 2020-10-27 DIAGNOSIS — H9190 Unspecified hearing loss, unspecified ear: Secondary | ICD-10-CM | POA: Insufficient documentation

## 2020-10-27 DIAGNOSIS — E785 Hyperlipidemia, unspecified: Secondary | ICD-10-CM | POA: Diagnosis not present

## 2020-10-27 DIAGNOSIS — Z86718 Personal history of other venous thrombosis and embolism: Secondary | ICD-10-CM | POA: Diagnosis not present

## 2020-10-27 DIAGNOSIS — Z95810 Presence of automatic (implantable) cardiac defibrillator: Secondary | ICD-10-CM | POA: Diagnosis not present

## 2020-10-27 DIAGNOSIS — I272 Pulmonary hypertension, unspecified: Secondary | ICD-10-CM | POA: Diagnosis not present

## 2020-10-27 DIAGNOSIS — Z794 Long term (current) use of insulin: Secondary | ICD-10-CM | POA: Diagnosis not present

## 2020-10-27 DIAGNOSIS — Z79899 Other long term (current) drug therapy: Secondary | ICD-10-CM | POA: Insufficient documentation

## 2020-10-27 DIAGNOSIS — I480 Paroxysmal atrial fibrillation: Secondary | ICD-10-CM | POA: Insufficient documentation

## 2020-10-27 DIAGNOSIS — I428 Other cardiomyopathies: Secondary | ICD-10-CM | POA: Diagnosis not present

## 2020-10-27 DIAGNOSIS — Z7901 Long term (current) use of anticoagulants: Secondary | ICD-10-CM | POA: Diagnosis not present

## 2020-10-27 DIAGNOSIS — N183 Chronic kidney disease, stage 3 unspecified: Secondary | ICD-10-CM | POA: Insufficient documentation

## 2020-10-27 DIAGNOSIS — I130 Hypertensive heart and chronic kidney disease with heart failure and stage 1 through stage 4 chronic kidney disease, or unspecified chronic kidney disease: Secondary | ICD-10-CM | POA: Insufficient documentation

## 2020-10-27 DIAGNOSIS — I5022 Chronic systolic (congestive) heart failure: Secondary | ICD-10-CM | POA: Insufficient documentation

## 2020-10-27 DIAGNOSIS — Z9114 Patient's other noncompliance with medication regimen: Secondary | ICD-10-CM | POA: Insufficient documentation

## 2020-10-27 DIAGNOSIS — Z86711 Personal history of pulmonary embolism: Secondary | ICD-10-CM | POA: Diagnosis not present

## 2020-10-27 DIAGNOSIS — E1122 Type 2 diabetes mellitus with diabetic chronic kidney disease: Secondary | ICD-10-CM | POA: Diagnosis not present

## 2020-10-27 DIAGNOSIS — F1010 Alcohol abuse, uncomplicated: Secondary | ICD-10-CM | POA: Diagnosis not present

## 2020-10-27 LAB — CBC
HCT: 35.2 % — ABNORMAL LOW (ref 39.0–52.0)
Hemoglobin: 10.8 g/dL — ABNORMAL LOW (ref 13.0–17.0)
MCH: 29 pg (ref 26.0–34.0)
MCHC: 30.7 g/dL (ref 30.0–36.0)
MCV: 94.6 fL (ref 80.0–100.0)
Platelets: 249 10*3/uL (ref 150–400)
RBC: 3.72 MIL/uL — ABNORMAL LOW (ref 4.22–5.81)
RDW: 13.2 % (ref 11.5–15.5)
WBC: 6.3 10*3/uL (ref 4.0–10.5)
nRBC: 0 % (ref 0.0–0.2)

## 2020-10-27 LAB — BASIC METABOLIC PANEL
Anion gap: 8 (ref 5–15)
BUN: 37 mg/dL — ABNORMAL HIGH (ref 6–20)
CO2: 27 mmol/L (ref 22–32)
Calcium: 9.3 mg/dL (ref 8.9–10.3)
Chloride: 99 mmol/L (ref 98–111)
Creatinine, Ser: 2.8 mg/dL — ABNORMAL HIGH (ref 0.61–1.24)
GFR, Estimated: 26 mL/min — ABNORMAL LOW (ref >60)
Glucose, Bld: 282 mg/dL — ABNORMAL HIGH (ref 70–99)
Potassium: 4.5 mmol/L (ref 3.5–5.1)
Sodium: 134 mmol/L — ABNORMAL LOW (ref 135–145)

## 2020-10-27 MED ORDER — DAPAGLIFLOZIN PROPANEDIOL 10 MG PO TABS: 10.0000 mg | ORAL_TABLET | Freq: Every day | ORAL | 3 refills | Status: DC

## 2020-10-27 MED ORDER — DAPAGLIFLOZIN PROPANEDIOL 10 MG PO TABS: 10.0000 mg | ORAL_TABLET | Freq: Every day | ORAL | 11 refills | Status: DC

## 2020-10-27 MED ORDER — CARVEDILOL 25 MG PO TABS: 25.0000 mg | ORAL_TABLET | Freq: Two times a day (BID) | ORAL | 11 refills | Status: DC

## 2020-10-27 MED ORDER — CARVEDILOL 25 MG PO TABS: 25.0000 mg | ORAL_TABLET | Freq: Two times a day (BID) | ORAL | 3 refills | Status: DC

## 2020-10-27 NOTE — Progress Notes (Signed)
Advanced Heart Failure Clinic Note   PCP: Kathe Becton, FNP HF Cardiology: Dr. Aundra Dubin  Reason for Visit: f/u for chronic systolic heart failure   [name] is a 50 year old with a history of with deafness, HTN, DM, and chronic systolic HF.   Patient had no known cardiac problems until 10/16.  At that time, he developed exertional dyspnea and was admitted to a hospital in [place], [place] with acute systolic CHF.  Echo 10/16 showed EF 15-20%.  Cardiac cath showed no significant coronary disease.  He was diuresed and started on cardiac meds. Subsequently, he moved to [facility].  Now working in a [employer].  Repeat echo in 5/18 showed persistently low EF, 25-30%.  He had [place] placed.   He was admitted briefly with CHF exacerbation in 8/20.    Echo in 9/20 showed EF 30-35% with diffuse hypokinesis.   Ultrasound in 4/21 showed right leg DVT and CT showed a PE.  No obvious trigger (no long trip, no surgery, no prolonged immobility).  He was started on apixaban.   He quit taking his meds in 8/21 and was drinking heavily due to depression, later restarted meds.  In 10/21, he was admitted with DKA and atrial fibrillation/RVR (1st known episode).  He converted to NSR spontaneously.  [name] was stopped due to AKI.  He had melena/hematochezia and EGD showed gastritis/esophagitis.  Apixaban was stopped.  He went home but then was re-admitted later in 10/21 with bilateral PEs.   Echo in 10/21 showed EF 45%, mild global hypokinesis, moderate LVH, mildly decreased RV systolic function.    On 05/05/20, he had a near-syncopal event and went to the ER, no events on ICD interrogation.  ?Orthostatic.  No further dizziness/lightheadedness. He is working at [employer] in [employer].  Able to do his job without dyspnea, but he gets short of breath after walking about 50 feet.  Sleepy during the day. Sleep study ordered.  Repeat RHC (2/22) showed ptimized filling pressures, mild pulmonary  hypertension, preserved cardiac output. Suspect elevated PA pressure may be related to OSA.    Admitted [id number]  With A/C systolic heart failure. Diuresed with IV lasix and transitioned to lasix 60 mg daily. Discharge weight 232 pounds.   09/10/20 he presented for scheduled cardiomems and was noted to be in A fib RVR. RHC with succesful cardiomems implant. Stable hemodynamics on cath. Admitted from cath lab with A fib RVR and started on amio drip. Chemically converted to NSR was transitioned to amio 200 mg twice a day. Also incidentally found to be COVID +.   09/30/20, he was admitted w/ acute subcortical CVA. Unable to get cMRI given ICD. Neurology felt related to small vessel disease. Eliquis continued. He was d/c to CIR for rehab. Now back at home. Also of note, per chart review, his Wilder Glade was discontinued on 6/26 b/c GFR was "too low" at 29. Most recent hgb A1c was 9.7.   Presents to clinic today for f/u. Here w/ interpreter. Reports doing fairly well. No residual neurological deficits. Denies any significant dyspnea. NYHA Class II. Denies palpitations. Reports full med compliance. He has not been compliant w/ sending CardioMEMs transmissions. We attempted to interrogate device to check Heart Logic score but unable to generate report due to network system error. ReDs Clip 41%.   Development worker, international aid: Attempted device interrogation to [employer] score but unable to generate report due to network system error.  Labs (9/17): BNP 477,  K 4.5 => 4.2, creatinine 0.92 => 1.1, BNP 1009, HIV negative, SPEP negative.  Labs (10/17): K 5.2, creatinine 1.10, hgb 12.5 Labs (03/28/2016): K 5.3 Creatinine 1.07  Labs (3/18): digoxin < 0.2, K 4.1, creatinine 0.97 Labs (9/18): hgb 12.2, K 4, creatinine 0.99, BNP 139 Labs (1/19): hgb 11.2, K 4.8, creatinine 1.26 Labs (8/20): K 3.7, creatinine 1.69 Labs (5/21): K 5, creatinine 1.4, LDL 192 Labs (10/21): K 4.1, creatinine 1.77, LDL 180, TGs  138 Labs (11/21): K 4.5, creatinine 2.68 Labs (12/21): K 5.1, creatinine 2.25 Labs (04/30/20): K 4.7, creatinine 2.7 Labs (05/05/20): K 4.0, creatinine 2.15 Labs (05/11/20): K 4.2, creatinine 2.43, LDL 76, HDL 38, TGs 220 Labs (2/22): K 4.4, creatinine 2.69 Labs (2/22): SCr 2.56, GFR 29, K 3.9   PMH: 1. HTN 2. Diabetes 3. Hyperlipidemia 4. Deafness since age 58: Needs sign language interpreter.  5. Chronic systolic CHF: Nonischemic cardiomyopathy.  Diagnosis 10/16 in [place], [place] (admitted with acute systolic CHF).  SPEP and HIV negative.  [facility].  - Echo (10/16): EF 15-20%, mild LVH, mild MR, normal RV size and systolic function.  - LHC (10/16): No significant CAD.  - CPX (12/17): Mild functional limitation.  - 04/2016 CMRI: EF 27%. RV normal. No evidence of infiltrative disease, myocarditis, or MI.  - Echo (5/18): EF 25-30%, mild LV dilation, mild LVH, normal RV size with mildly decreased - Echo (9/20): EF 30-35%, mild LVH, diffuse hypokinesis, normal RV size and systolic function.  - Echo (10/21): EF 45%, mild global hypokinesis, moderate LVH, mildly decreased RV systolic function.  - RHC (2/22): Optimized filling pressures, mild pulmonary hypertension, preserved cardiac output. Continue current diuretics.  Suspect elevated PA pressure may be related to OSA.   6. Depression. 7. Colitis episodes.  8. CKD stage 3.  9. DVT/PE in 4/21: No obvious trigger.  - Recurrent PE in 10/21, had been off anticoagulation.  10. Atrial fibrillation: Paroxysmal.  11. Gastritis/esophagitis: 10/21.  12. ETOH abuse  SH: From [place] originally.  Lives in [place] until 2017, then moved to [place].  Nonsmoker.  Unemployed.  H/o ETOH abuse.    FH: No known cardiac disease.   ROS: All systems reviewed and negative except as per HPI.   Current Outpatient Medications  Medication Sig Dispense Refill   acetaminophen (TYLENOL) 325 MG tablet Take 2 tablets (650 mg total) by mouth every 4 (four)  hours as needed for mild pain (or temp > 37.5 C (99.5 F)).     amiodarone (PACERONE) 200 MG tablet Take 1 tablet (200 mg total) by mouth 2 (two) times daily. 60 tablet 6   apixaban (ELIQUIS) 5 MG TABS tablet TAKE 1 TABLET(5 MG) BY MOUTH TWICE DAILY 60 tablet 11   atorvastatin (LIPITOR) 80 MG tablet Take 1 tablet (80 mg total) by mouth at bedtime. 30 tablet 11   carvedilol (COREG) 12.5 MG tablet Take 1 tablet (12.5 mg total) by mouth 2 (two) times daily. 60 tablet 11   dicyclomine (BENTYL) 20 MG tablet Take 1 tablet (20 mg total) by mouth 2 (two) times daily. 60 tablet 0   famotidine (PEPCID) 20 MG tablet Take 1 tablet (20 mg total) by mouth 2 (two) times daily. 30 tablet 0   fenofibrate 54 MG tablet Take 1 tablet (54 mg total) by mouth daily. 90 tablet 0   furosemide (LASIX) 20 MG tablet Take 2 tablets (40 mg total) by mouth daily. 60 tablet 0   hydrALAZINE (APRESOLINE) 100 MG tablet Take 1  tablet (100 mg total) by mouth 3 (three) times daily. 90 tablet 0   insulin glargine (LANTUS SOLOSTAR) 100 UNIT/ML Solostar Pen Inject 35 Units into the skin 2 (two) times daily. 15 mL 11   isosorbide mononitrate (IMDUR) 60 MG 24 hr tablet Take 1 tablet (60 mg total) by mouth daily. 30 tablet 11   latanoprost (XALATAN) 0.005 % ophthalmic solution Place 1 drop into both eyes at bedtime.     meclizine (ANTIVERT) 25 MG tablet Take 1 tablet (25 mg total) by mouth 3 (three) times daily as needed for dizziness. 90 tablet 3   pantoprazole (PROTONIX) 40 MG tablet Take 1 tablet (40 mg total) by mouth 2 (two) times daily. 180 tablet 3   sertraline (ZOLOFT) 50 MG tablet Take 1 tablet (50 mg total) by mouth daily. 30 tablet 0   VASCEPA 1 g capsule Take 2 capsules (2 g total) by mouth 2 (two) times daily. 120 capsule 5   No current facility-administered medications for this encounter.   BP (!) 154/78   Pulse 80   Wt 106.4 kg (234 lb 9.6 oz)   SpO2 98%   BMI 33.66 kg/m    Wt Readings from Last 3 Encounters:   10/27/20 106.4 kg (234 lb 9.6 oz)  10/14/20 103.9 kg (229 lb)  10/14/20 103.9 kg (229 lb 0.6 oz)    PHYSICAL EXAM: ReDs Clip 41%  General:  Well appearing. No respiratory difficulty HEENT: normal Neck: supple. JVD 10 cm Carotids 2+ bilat; no bruits. No lymphadenopathy or thyromegaly appreciated. Cor: PMI nondisplaced. Regular rate & rhythm. No rubs, gallops or murmurs. Lungs: clear Abdomen: soft, nontender, nondistended. No hepatosplenomegaly. No bruits or masses. Good bowel sounds. Extremities: no cyanosis, clubbing, rash, edema Neuro: alert & oriented x 3, cranial nerves grossly intact. moves all 4 extremities w/o difficulty. Affect pleasant.  Assessment/Plan: 1. Chronic systolic CHF: Nonischemic cardiomyopathy, etiology uncertain. Consider prior ETOH or HTN.  He has a [place].  cMRI in 1/18 with EF 27%, RV normal, no evidence of infiltrative disease, myocarditis, or MI. Echo in 9/20 showed EF 30-35%.  Echo in 10/21 showed EF up to 45% with mild RV dysfunction.  Repeat RHC (2/22) with optimized filling pressures, preserved CO and moderate pulmonary hypertension-->likely due to untreated sleep apnea. Still awaiting CPAP device. Cardiomems placed 6/22. RHC at time of placement: RA 8, PCWP 9, CI 1.93 (Fick), 2.7 (Thermo) - NYHA II - Has ICD: unable to interrogate device today (see above) - Mildly Volume overload on exam. ReDs Clip 41%. Has not been compliant w/ Cardiomems - Restart Farxiga 10 mg daily (GFR 29)  - Continue lasix 60 mg daily.  - Increase Coreg to 25 mg bid for better BP control.    - Continue hydralazine to 100 mg tid + Imdur 60 mg daily. - Off Entresto and spironolactone with elevated creatinine.  - Check BMET today and again in 7 days  - Advised to improve compliance w/ cardiomems  2. Type II diabetes: management per PCP. Continue Lantus. He does not check his blood sugars at home. Most recent Hgb A1c 9.7.  - Restart Farxiga 10 mg daily 3. HTN: Mildly  elevated - increase Coreg to 25 mg bid  4. Deafness: Sign language interpreter present at visit.  5. CKD: Stage 3.  - Check BMET  and again in 7 days  - Continue Farxiga as long as GFR > 25 - Refer to nephrology.  6. PE/DVT: 4/21.  Unclear cause, no prolonged  trip, prolonged immobility, or history of malignancy. No FH clotting. Recurrent PE in 10/21 while Eliquis on hold with bleeding from esophagitis/gastritis.  Do no think this was an Eliquis failure.  - Continue apixaban, think he will need long-term with no clear trigger for VTE + AFib. No bleeding issues.  7. Hyperlipidemia: Continue atorvastatin, good lipids 1/22.  8. Atrial fibrillation: First noted in setting of DKA in 10/21.  Had been on amiodarone but discontinued 5/22. Had recurrent Afib 6/22 - Given recurrence will refer to EP for ablation.  - Regular on exam.  - Continue Eliquis. No bleeding. 9. ETOH abuse: ETOH may have played a role in both cardiomyopathy and atrial fibrillation.  No longer drinking alcohol.  10. Severe OSA: recent sleep study (AHI = 125.7/h). Followed by Dr. Radford Pax. Awaiting CPAP device.   11. Noncompliance:  Doing much better with meds. Continue HF Paramedicine .  12. CVA: 6/22, subcortical CVA. Unable to get cMRI given ICD. Neurology felt related to small vessel disease. No residual deficits - continue Eliquis.   F/u in 4 weeks w/ APP   Lyda Jester, PA-C- 10/27/2020

## 2020-10-27 NOTE — Patient Instructions (Signed)
INCREASE Coreg to 25 mg, one tab twice a day RESTART Farxiga 10 mg, one tab daily  Labs today We will only contact you if something comes back abnormal or we need to make some changes. Otherwise no news is good news!  Labs needed in 7-10 days  Your physician recommends that you schedule a follow-up appointment in: 4 weeks  in the Advanced Practitioners (PA/NP) Clinic   Please be sure to schedule a follow up with [place]   Do the following things EVERYDAY: Weigh yourself in the morning before breakfast. Write it down and keep it in a log. Take your medicines as prescribed Eat low salt foods--Limit salt (sodium) to 2000 mg per day.  Stay as active as you can everyday Limit all fluids for the day to less than 2 liters  milAt the Advanced Heart Failure Clinic, you and your health needs are our priority. As part of our continuing mission to provide you with exceptional heart care, we have created designated Provider Care Teams. These Care Teams include your primary Cardiologist (physician) and Advanced Practice Providers (APPs- Physician Assistants and Nurse Practitioners) who all work together to provide you with the care you need, when you need it.   You may see any of the following providers on your designated Care Team at your next follow up: Dr Glori Bickers Dr Loralie Champagne Dr Patrice Paradise, NP Lyda Jester, [place] Ginnie Smart Audry Riles, PharmD   Please be sure to bring in all your medications bottles to every appointment.

## 2020-10-27 NOTE — Progress Notes (Signed)
ReDS Vest / Clip - 10/27/20 1600       ReDS Vest / Clip   Station Marker D    Ruler Value 32    ReDS Value Range High volume overload    ReDS Actual Value 41

## 2020-11-03 ENCOUNTER — Other Ambulatory Visit (HOSPITAL_COMMUNITY): Payer: Medicare Other

## 2020-11-04 ENCOUNTER — Other Ambulatory Visit (HOSPITAL_COMMUNITY): Payer: Self-pay

## 2020-11-04 ENCOUNTER — Telehealth: Payer: Self-pay | Admitting: Clinical

## 2020-11-04 NOTE — Progress Notes (Signed)
Paramedicine Encounter    Patient ID: Troy Rush, male    DOB: Aug 27, 1970, 50 y.o.   MRN: [id number]   Patient Care Team: Vevelyn Francois, NP as PCP - General (Adult Health Nurse Practitioner) Larey Dresser, MD as PCP - Cardiology (Cardiology) Conrad Sanford, NP as Nurse Practitioner (Cardiology) Larey Dresser, MD as Consulting Physician (Cardiology) Jorge Ny, LCSW as Social Worker (Licensed Clinical Social Worker)  Patient Active Problem List   Diagnosis Date Noted   Subcortical infarction [facility]) 10/03/2020   Cerebral thrombosis with cerebral infarction 10/01/2020   Unilateral weakness 09/30/2020   History of atrial fibrillation 09/30/2020   History of DVT (deep vein thrombosis) 09/30/2020   CKD (chronic kidney disease) stage 3, GFR 30-59 ml/min ([place]) 07/08/2020   Acute CHF (congestive heart failure) ([place]) 07/07/2020   CKD (chronic kidney disease), stage IV ([place]) 07/07/2020   ICD (implantable cardioverter-defibrillator) in place 05/14/2020   Pulmonary embolism ([place]) 02/05/2020   Acute kidney injury superimposed on CKD ([place])    Chronic combined systolic and diastolic heart failure ([place])    Atrial fibrillation with RVR ([place]) 02/01/2020   NSVT (nonsustained ventricular tachycardia) ([place]) 02/01/2020   Acute GI bleeding 02/01/2020   DKA (diabetic ketoacidosis) ([place]) 02/01/2020   Bilateral deafness 08/15/2019   Type 2 diabetes mellitus with complication, with long-term current use of insulin ([place]) 08/15/2019   Hemoglobin A1C between 7% and 9% indicating borderline diabetic control 08/15/2019   Insomnia 08/15/2019   Abdominal pain 11/14/2018   Ischemic cardiomyopathy 10/14/2016   NICM (nonischemic cardiomyopathy) ([place]) 123XX123   Chronic systolic CHF (congestive heart failure) ([place]) 05/30/2016   HTN (hypertension) 05/30/2016   Deaf 05/30/2016   Hyperkalemia 05/30/2016   Major depressive disorder, recurrent episode ([place]) 03/21/2016   Bronchitis 01/21/2015    Dyslipidemia 01/21/2015   Vitamin D deficiency 12/19/2011   Personal history of other infectious and parasitic diseases 10/25/2011   Anxiety disorder 05/12/2011   Hyperlipidemia 05/12/2011   Hypertriglyceridemia 05/12/2011   Obstructive sleep apnea 05/12/2011    Current Outpatient Medications:    acetaminophen (TYLENOL) 325 MG tablet, Take 2 tablets (650 mg total) by mouth every 4 (four) hours as needed for mild pain (or temp > 37.5 C (99.5 F))., Disp: , Rfl:    amiodarone (PACERONE) 200 MG tablet, Take 1 tablet (200 mg total) by mouth 2 (two) times daily., Disp: 60 tablet, Rfl: 6   apixaban (ELIQUIS) 5 MG TABS tablet, TAKE 1 TABLET(5 MG) BY MOUTH TWICE DAILY, Disp: 60 tablet, Rfl: 11   atorvastatin (LIPITOR) 80 MG tablet, Take 1 tablet (80 mg total) by mouth at bedtime., Disp: 30 tablet, Rfl: 11   carvedilol (COREG) 25 MG tablet, Take 1 tablet (25 mg total) by mouth 2 (two) times daily., Disp: 180 tablet, Rfl: 3   dapagliflozin propanediol (FARXIGA) 10 MG TABS tablet, Take 1 tablet (10 mg total) by mouth daily before breakfast., Disp: 90 tablet, Rfl: 3   fenofibrate 54 MG tablet, Take 1 tablet (54 mg total) by mouth daily., Disp: 90 tablet, Rfl: 0   furosemide (LASIX) 20 MG tablet, Take 2 tablets (40 mg total) by mouth daily., Disp: 60 tablet, Rfl: 0   hydrALAZINE (APRESOLINE) 100 MG tablet, Take 1 tablet (100 mg total) by mouth 3 (three) times daily., Disp: 90 tablet, Rfl: 0   insulin glargine (LANTUS SOLOSTAR) 100 UNIT/ML Solostar Pen, Inject 35 Units into the skin 2 (two) times daily., Disp: 15 mL, Rfl: 11   isosorbide mononitrate (  IMDUR) 60 MG 24 hr tablet, Take 1 tablet (60 mg total) by mouth daily., Disp: 30 tablet, Rfl: 11   latanoprost (XALATAN) 0.005 % ophthalmic solution, Place 1 drop into both eyes at bedtime., Disp: , Rfl:    meclizine (ANTIVERT) 25 MG tablet, Take 1 tablet (25 mg total) by mouth 3 (three) times daily as needed for dizziness., Disp: 90 tablet, Rfl: 3    pantoprazole (PROTONIX) 40 MG tablet, Take 1 tablet (40 mg total) by mouth 2 (two) times daily., Disp: 180 tablet, Rfl: 3   sertraline (ZOLOFT) 50 MG tablet, Take 1 tablet (50 mg total) by mouth daily., Disp: 30 tablet, Rfl: 0   VASCEPA 1 g capsule, Take 2 capsules (2 g total) by mouth 2 (two) times daily., Disp: 120 capsule, Rfl: 5   dicyclomine (BENTYL) 20 MG tablet, Take 1 tablet (20 mg total) by mouth 2 (two) times daily. (Patient not taking: Reported on 11/04/2020), Disp: 60 tablet, Rfl: 0   famotidine (PEPCID) 20 MG tablet, Take 1 tablet (20 mg total) by mouth 2 (two) times daily. (Patient not taking: Reported on 11/04/2020), Disp: 30 tablet, Rfl: 0 Allergies  Allergen Reactions   Lisinopril Cough      Social History   Socioeconomic History   Marital status: Legally Separated    Spouse name: Not on file   Number of children: Not on file   Years of education: Not on file   Highest education level: Not on file  Occupational History   Not on file  Tobacco Use   Smoking status: Never   Smokeless tobacco: Never  Vaping Use   Vaping Use: Never used  Substance and Sexual Activity   Alcohol use: Yes    Alcohol/week: 1.0 - 2.0 standard drink    Types: 1 - 2 Glasses of wine per week    Comment: rare   Drug use: No   Sexual activity: Yes  Other Topics Concern   Not on file  Social History Narrative   Not on file   Social Determinants of Health   Financial Resource Strain: Medium Risk   Difficulty of Paying Living Expenses: Somewhat hard  Food Insecurity: No Food Insecurity   Worried About Charity fundraiser in the Last Year: Never true   Ran Out of Food in the Last Year: Never true  Transportation Needs: No Transportation Needs   Lack of Transportation (Medical): No   Lack of Transportation (Non-Medical): No  Physical Activity: Not on file  Stress: Not on file  Social Connections: Not on file  Intimate Partner Violence: Not on file    Physical Exam      Future  Appointments  Date Time Provider [place]  11/10/2020  2:15 PM Charlett Blake, MD [facility] CPR  11/11/2020 10:00 AM [facility] LAB [facility] None  11/18/2020  1:15 PM Frann Rider, NP [facility] None  11/24/2020 10:00 AM [facility] PA/NP [facility] None  12/11/2020  1:40 PM Vevelyn Francois, NP [place] [facility] None  12/21/2020  8:35 AM [facility] DEVICE REMOTES [facility]  03/22/2021  8:35 AM [facility] DEVICE REMOTES [facility]  06/21/2021  8:35 AM [facility] DEVICE REMOTES [facility]  09/20/2021  8:35 AM [facility] DEVICE REMOTES [facility]  12/20/2021  8:35 AM [facility] DEVICE REMOTES [facility]    BP 138/74   Pulse 74   Resp 18   Wt 237 lb (107.5 kg)   BMI 34.01 kg/m   Weight yesterday-? Last visit weight-234 @ clinic  Pt reports  he is doing well. he denies sob, no dizziness, no c/p.  There were some med changes at clinic last time. His carvedilol was increased and his farxiga was added back.  He needs the amio- I will get from pharmacy tomor and bring out to place in pill box.   He did call about his CPAP and they are behind-they are working on jan/feb requests so it may be some time before he gets it. Not sure if theres anything that can be done about that.  Meds verified and 2 wks pill boxes filled.  -refills needed for next time-  Eliquis Atorvastatin Farxiga isosorbide Meclizine pantoprazole Zoloft [place], [place] Paramedic  11/04/20

## 2020-11-04 NOTE — Telephone Encounter (Signed)
Integrated Behavioral Health General Follow Up Note  11/04/2020 Name: Troy Rush MRN: [id number] DOB: 11/04/1970 Troy Rush is a 50 y.o. year old male who sees Vevelyn Francois, NP for primary care. LCSW was consulted to assess patient's needs and assist the patient with Financial Difficulties related to low income and overdue utility bills.  Interpreter: No.   Interpreter Name & Language: none - patient is deaf and requires ASL interpreter, but had requested to communicate via email when he is not in the office  Assessment: Patient experiencing financial difficulties related to low income.  Ongoing Intervention: CSW communicated with patient via email, per patient's request. Patient is behind on rent and utility bills due to low income. His income is lower this summer because he works as a Training and development officer at [employer] and does not have work during the summer. He receives some social security but not enough to cover expenses when not working. CSW applied for assistance for patient's rent through [facility] [place] and this request was approved. Advised patient of this and CSW coordinated with assistance fund staff.   Review of patient status, including review of consultants reports, relevant laboratory and other test results, and collaboration with appropriate care team members and the patient's provider was performed as part of comprehensive patient evaluation and provision of services.    Estanislado Emms, [facility] [phone]

## 2020-11-05 ENCOUNTER — Other Ambulatory Visit (HOSPITAL_COMMUNITY): Payer: Self-pay

## 2020-11-05 NOTE — Progress Notes (Signed)
P/u his meds today from pharmacy-the amio, carvedilol and farxiga-delivered to him and placed the amio where it was needed in both pill boxes-I will see him again in 2 wks.   [name], EMT-Paramedic  11/05/20

## 2020-11-07 ENCOUNTER — Other Ambulatory Visit (HOSPITAL_COMMUNITY): Payer: Self-pay | Admitting: Cardiology

## 2020-11-10 ENCOUNTER — Encounter: Payer: Self-pay | Admitting: Physical Medicine & Rehabilitation

## 2020-11-10 ENCOUNTER — Encounter: Payer: Medicare Other | Attending: Physical Medicine & Rehabilitation | Admitting: Physical Medicine & Rehabilitation

## 2020-11-10 ENCOUNTER — Other Ambulatory Visit: Payer: Self-pay

## 2020-11-10 VITALS — BP 115/74 | HR 79 | Temp 98.7°F | Ht 70.0 in | Wt 232.4 lb

## 2020-11-10 DIAGNOSIS — Z8673 Personal history of transient ischemic attack (TIA), and cerebral infarction without residual deficits: Secondary | ICD-10-CM | POA: Diagnosis not present

## 2020-11-10 DIAGNOSIS — I6330 Cerebral infarction due to thrombosis of unspecified cerebral artery: Secondary | ICD-10-CM

## 2020-11-10 NOTE — Progress Notes (Signed)
Subjective:  ASL interpreter with patient  Patient ID: SKLYER BAGE, male    DOB: 1970-08-26, 50 y.o.   MRN: [id number] 49 y.o. right-handed male with history of diastolic congestive heart failure hypertension atrial fibrillation nonischemic cardiomyopathy with ICD diabetes mellitus deafness and mutism as well as history of DVT/pulmonary emboli on Eliquis, CKD stage IV OSA.  Presented to the ED with dizziness and right side numbness found to have left subcortical CVA due to small vessel disease per neurology services.  CT of the head showed no abnormality and MRI was unobtainable due to ICD placement.  Not a candidate for anticoagulation and patient remained on Eliquis.  Due to decreased functional ability patient was admitted for a comprehensive rehab program  HPI Mod I ADLs , does house work, does not require assistive device for ambulation. Has some chronic balance issue related to his chronic hearing loss.  No therapy since discharge from rehab.  None was recommended based on his excellent functional recovery  Pain Inventory Average Pain 0 Pain Right Now 0 My pain is  no pain  LOCATION OF PAIN  no pain here for stroke follow up  BOWEL Number of stools per week: 7 Oral laxative use Yes  Type of laxative ORAL Enema or suppository use No  History of colostomy No  Incontinent No   BLADDER Normal In and out cath, frequency N/A Able to self cath No  Bladder incontinence No  Frequent urination Yes  Leakage with coughing No  Difficulty starting stream No  Incomplete bladder emptying Yes   Mobility walk without assistance how many minutes can you walk? No problem walking ability to climb steps?  yes do you drive?  no Do you have any goals in this area?  yes  Function what is your job? Not working.  Neuro/Psych depression anxiety  Prior Studies Any changes since last visit?  no  Physicians involved in your care Any changes since last visit?  no   Family History   Problem Relation Age of Onset   Hypertension Mother    Healthy Father    Stroke Father    Social History   Socioeconomic History   Marital status: Legally Separated    Spouse name: Not on file   Number of children: Not on file   Years of education: Not on file   Highest education level: Not on file  Occupational History   Not on file  Tobacco Use   Smoking status: Never   Smokeless tobacco: Never  Vaping Use   Vaping Use: Never used  Substance and Sexual Activity   Alcohol use: Yes    Alcohol/week: 1.0 - 2.0 standard drink    Types: 1 - 2 Glasses of wine per week    Comment: rare   Drug use: No   Sexual activity: Yes  Other Topics Concern   Not on file  Social History Narrative   Not on file   Social Determinants of Health   Financial Resource Strain: Medium Risk   Difficulty of Paying Living Expenses: Somewhat hard  Food Insecurity: No Food Insecurity   Worried About Charity fundraiser in the Last Year: Never true   Ran Out of Food in the Last Year: Never true  Transportation Needs: No Transportation Needs   Lack of Transportation (Medical): No   Lack of Transportation (Non-Medical): No  Physical Activity: Not on file  Stress: Not on file  Social Connections: Not on file   Past Surgical History:  Procedure Laterality Date   BACK SURGERY     CARDIAC CATHETERIZATION     ESOPHAGOGASTRODUODENOSCOPY (EGD) WITH PROPOFOL N/A 02/02/2020   Procedure: ESOPHAGOGASTRODUODENOSCOPY (EGD) WITH PROPOFOL;  Surgeon: Arta Silence, MD;  Location: [facility];  Service: Endoscopy;  Laterality: N/A;   ICD IMPLANT  10/14/2016   ICD IMPLANT N/A 10/14/2016   Procedure: ICD Implant;  Surgeon: Deboraha Sprang, MD;  Location: [place] [facility];  Service: Cardiovascular;  Laterality: N/A;   PRESSURE SENSOR/CARDIOMEMS N/A 09/10/2020   Procedure: PRESSURE SENSOR/CARDIOMEMS;  Surgeon: Larey Dresser, MD;  Location: [place] [facility];  Service: Cardiovascular;  Laterality: N/A;    RIGHT HEART CATH N/A 05/21/2020   Procedure: RIGHT HEART CATH;  Surgeon: Larey Dresser, MD;  Location: [place] [facility];  Service: Cardiovascular;  Laterality: N/A;   WRIST SURGERY     Past Medical History:  Diagnosis Date   AICD (automatic cardioverter/defibrillator) present 10/14/2016   Bilateral deafness    Bronchitis    CHF (congestive heart failure) ([place])    Depression    Diabetes mellitus without complication ([place])    DVT, lower extremity ([place]) 07/12/2019   Dyslipidemia    Headache 08/2019   Hypertension    ICD (implantable cardioverter-defibrillator) in place    Insomnia 08/2019   NICM (nonischemic cardiomyopathy) ([place]) 09/15/2016   Pancreatitis    Sleep apnea    Vitamin D deficiency 08/2019   BP 115/74   Pulse 79   Temp 98.7 F (37.1 C)   Ht '5\' 10"'$  (1.778 m)   Wt 232 lb 6.4 oz (105.4 kg)   SpO2 98%   BMI 33.35 kg/m   Opioid Risk Score:   Fall Risk Score:  `1  Depression screen PHQ 2/9  Depression screen [facility] 2/9 11/10/2020 03/11/2020 12/06/2017 08/07/2017 05/09/2017 01/09/2017 12/13/2016  Decreased Interest 0 0 0 0 0 0 0  Down, Depressed, Hopeless 0 0 0 0 0 1 1  PHQ - 2 Score 0 0 0 0 0 1 1  Altered sleeping 0 - - - - - -  Tired, decreased energy 1 - - - - - -  Change in appetite 0 - - - - - -  Feeling bad or failure about yourself  0 - - - - - -  Trouble concentrating 0 - - - - - -  Moving slowly or fidgety/restless 0 - - - - - -  Suicidal thoughts 0 - - - - - -  PHQ-9 Score 1 - - - - - -  Some recent data might be hidden    Review of Systems  Psychiatric/Behavioral:         Depression & anxiety  All other systems reviewed and are negative.     Objective:   Physical Exam Constitutional:      Appearance: He is obese.  HENT:     Head: Normocephalic and atraumatic.  Eyes:     Extraocular Movements: Extraocular movements intact.     Conjunctiva/sclera: Conjunctivae normal.     Pupils: Pupils are equal, round, and reactive to light.  Neurological:      Mental Status: He is alert and oriented to person, place, and time.     Cranial Nerves: Dysarthria present.     Sensory: Sensation is intact.     Motor: Motor function is intact.     Coordination: Coordination is intact.     Gait: Tandem walk abnormal.     Comments: Chronic speech impairment related to hearing  loss  5/5 bilateral deltoid, bicep, tricep, grip, hip flexor, knee extensor, ankle dorsiflexion plantar flexor  Psychiatric:        Mood and Affect: Mood normal.        Behavior: Behavior normal.          Assessment & Plan:   #1.  Left subcortical infarct with no residual weakness.  The patient does have mild balance loss, reduced tandem gait which may be chronic according to the patient.  No further PT OT required.  Physical medicine rehab follow-up on PR basis Follow-up with neurology next week Follow-up with PCP for meds Follow-up with cardiology for CHF

## 2020-11-11 ENCOUNTER — Ambulatory Visit (HOSPITAL_COMMUNITY)
Admission: RE | Admit: 2020-11-11 | Discharge: 2020-11-11 | Disposition: A | Discharge status: Home / Self Care | Payer: Medicare Other | Source: Ambulatory Visit | Attending: Internal Medicine | Admitting: Internal Medicine

## 2020-11-11 DIAGNOSIS — I5022 Chronic systolic (congestive) heart failure: Secondary | ICD-10-CM | POA: Diagnosis not present

## 2020-11-11 DIAGNOSIS — I5042 Chronic combined systolic (congestive) and diastolic (congestive) heart failure: Secondary | ICD-10-CM

## 2020-11-11 LAB — CBC
HCT: 35.3 % — ABNORMAL LOW (ref 39.0–52.0)
Hemoglobin: 11 g/dL — ABNORMAL LOW (ref 13.0–17.0)
MCH: 28.6 pg (ref 26.0–34.0)
MCHC: 31.2 g/dL (ref 30.0–36.0)
MCV: 91.7 fL (ref 80.0–100.0)
Platelets: 293 10*3/uL (ref 150–400)
RBC: 3.85 MIL/uL — ABNORMAL LOW (ref 4.22–5.81)
RDW: 12.9 % (ref 11.5–15.5)
WBC: 3.8 10*3/uL — ABNORMAL LOW (ref 4.0–10.5)
nRBC: 0 % (ref 0.0–0.2)

## 2020-11-11 LAB — BASIC METABOLIC PANEL
Anion gap: 11 (ref 5–15)
BUN: 55 mg/dL — ABNORMAL HIGH (ref 6–20)
CO2: 22 mmol/L (ref 22–32)
Calcium: 9.7 mg/dL (ref 8.9–10.3)
Chloride: 99 mmol/L (ref 98–111)
Creatinine, Ser: 2.84 mg/dL — ABNORMAL HIGH (ref 0.61–1.24)
GFR, Estimated: 26 mL/min — ABNORMAL LOW (ref >60)
Glucose, Bld: 210 mg/dL — ABNORMAL HIGH (ref 70–99)
Potassium: 5 mmol/L (ref 3.5–5.1)
Sodium: 132 mmol/L — ABNORMAL LOW (ref 135–145)

## 2020-11-11 LAB — PROTIME-INR
INR: 1.1 (ref 0.8–1.2)
Prothrombin Time: 14.7 seconds (ref 11.4–15.2)

## 2020-11-12 ENCOUNTER — Encounter (HOSPITAL_COMMUNITY): Payer: Medicare Other | Admitting: Cardiology

## 2020-11-17 ENCOUNTER — Telehealth (HOSPITAL_COMMUNITY): Payer: Self-pay

## 2020-11-17 NOTE — Telephone Encounter (Signed)
[place] to order meds listed below:   Eliquis Atorvastatin  Isosorbide-pharm has to call insurance b/c it was filled at another pharmacy-he will check on that.   Meclizine pantoprazole Zoloft Vascepa  [name], EMT-Paramedic  11/17/20

## 2020-11-18 ENCOUNTER — Encounter: Payer: Self-pay | Admitting: Adult Health

## 2020-11-18 ENCOUNTER — Ambulatory Visit (INDEPENDENT_AMBULATORY_CARE_PROVIDER_SITE_OTHER): Payer: Medicare Other | Admitting: Adult Health

## 2020-11-18 VITALS — BP 118/68 | HR 67 | Ht 70.0 in | Wt 231.0 lb

## 2020-11-18 DIAGNOSIS — I4891 Unspecified atrial fibrillation: Secondary | ICD-10-CM | POA: Diagnosis not present

## 2020-11-18 DIAGNOSIS — E1165 Type 2 diabetes mellitus with hyperglycemia: Secondary | ICD-10-CM | POA: Diagnosis not present

## 2020-11-18 DIAGNOSIS — I10 Essential (primary) hypertension: Secondary | ICD-10-CM

## 2020-11-18 DIAGNOSIS — E785 Hyperlipidemia, unspecified: Secondary | ICD-10-CM | POA: Diagnosis not present

## 2020-11-18 DIAGNOSIS — I639 Cerebral infarction, unspecified: Secondary | ICD-10-CM

## 2020-11-18 NOTE — Patient Instructions (Addendum)
Continue Eliquis (apixaban) daily  and atorvastatin, fenofibrate and Zetia for secondary stroke prevention  Continue to follow up with PCP/cardiology regarding cholesterol, blood pressure and diabetes management  Maintain strict control of hypertension with blood pressure goal below 130/90, diabetes with hemoglobin A1c goal below 7% and cholesterol with LDL cholesterol (bad cholesterol) goal below 70 mg/dL.   We will check cholesterol levels today   Continue to follow with [facility] to get CPAP machine       Followup in the future with me in 6 months or call earlier if needed       Thank you for coming to see [place] at [facility]. I hope we have been able to provide you high quality care today.  You may receive a patient satisfaction survey over the next few weeks. We would appreciate your feedback and comments so that we may continue to improve ourselves and the health of our patients.

## 2020-11-18 NOTE — Progress Notes (Signed)
[facility] [address] [place]. [place] [address] [phone]       HOSPITAL FOLLOW UP NOTE  [name] Date of Birth:  07-13-1970 Medical Record Number:  [id number]   Reason for Referral:  hospital stroke follow up    SUBJECTIVE:   CHIEF COMPLAINT:  Chief Complaint  Patient presents with   Follow-up    RM 3 alone (has cone sign language interpreter) Pt is well and stable, states he has some dizziness from medication but overall doing good.      HPI:   [name] is a 50 y.o. male with stroke risk factors of HTN, HLD, DM2, OSA, CKD stage IV, hx of DVT/PE, AF with RVR, CHF, and nonischemic cardiomyopathy with ICD in place who presented on 09/30/2020 as a Code Stroke for evaluation of Rt weakness and decreased sensation of the Rt hemibody.  Personally reviewed hospitalization pertinent progress notes, lab work and imaging with summary provided.  Evaluated by Dr. Leonie Man for suspected small left subcortical infarct due to small vessel disease.  Patient not a tPA candidate due to full anticoagulation on Eliquis with history of A. fib.  Unable to obtain MRI d/t ICD placement.  CTA head/neck negative LVO with moderate narrowing of distal left cavernous and paraclinoid ICA.  EF 40%, LV mod decreased function, LV global hypokinesis.  A1c 9.7. LDL UTC d/t elevated triglyceride -questionable noncompliance of home meds atorvastatin, fenofibrate and Vascepa.  HTN stable on Coreg, Apresoline and Imdur.  Recommended continuation of Eliquis.  Other stroke risk factors: Obesity, CAD, OSA on CPAP, CHF and A. fib on Eliquis.  Discharged to [facility] 6/25-6/30  Today, 11/18/2020, [name] is being seen for hospital follow-up accompanied by [facility] interpreter for sign language.  He has recovered well from a stroke standpoint.  Upon CIR discharge, he did not require additional therapies as he made excellent progress.  Denies new stroke/TIA symptoms.  He works at [employer] in [employer].  Compliant on Eliquis, atorvastatin, fenofibrate and Zetia without side effects.  Blood pressure today 118/86. Monitors at home typically stable - but this morning was 88/47 - felt dizzy - ate salt and drank water and went back up. Occasional low BP but not often.  Glucose levels occasionally monitored usually 150s-200s but does not monitor daily. He has not yet received his CPAP machine as they are on back order.  No new concerns at this time.      Pertinent imaging  CT head  CTA head/neck 09/30/2020 IMPRESSION: There is no acute intracranial hemorrhage or evidence of acute infarction. ASPECT score is 10. No large vessel occlusion. Moderate narrowing of the distal left cavernous and paraclinoid ICA presumably secondary to circumferential noncalcified plaque.  CT head 10/01/2020 IMPRESSION: No acute intracranial abnormality.  2D echo IMPRESSIONS   1. Left ventricular ejection fraction, by estimation, is 35 to 40%. The  left ventricle has moderately decreased function. The left ventricle  demonstrates global hypokinesis. Left ventricular diastolic function could  not be evaluated.   2. Right ventricular systolic function is normal. The right ventricular  size is normal. There is normal pulmonary artery systolic pressure.   3. The mitral valve is normal in structure. No evidence of mitral valve  regurgitation. No evidence of mitral stenosis.   4. The aortic valve is tricuspid. Aortic valve regurgitation is not  visualized. No aortic stenosis is present.   5. The inferior vena cava is dilated in size with <50%  respiratory  variability, suggesting right atrial pressure of 15 mmHg.       ROS:   14 system review of systems performed and negative with exception of those listed in HPI  PMH:  Past Medical History:  Diagnosis Date   AICD (automatic cardioverter/defibrillator) present 10/14/2016   Bilateral deafness    Bronchitis    CHF (congestive heart failure)  ([place])    Depression    Diabetes mellitus without complication ([place])    DVT, lower extremity ([place]) 07/12/2019   Dyslipidemia    Headache 08/2019   Hypertension    ICD (implantable cardioverter-defibrillator) in place    Insomnia 08/2019   NICM (nonischemic cardiomyopathy) ([place]) 09/15/2016   Pancreatitis    Sleep apnea    Vitamin D deficiency 08/2019    PSH:  Past Surgical History:  Procedure Laterality Date   BACK SURGERY     CARDIAC CATHETERIZATION     ESOPHAGOGASTRODUODENOSCOPY (EGD) WITH PROPOFOL N/A 02/02/2020   Procedure: ESOPHAGOGASTRODUODENOSCOPY (EGD) WITH PROPOFOL;  Surgeon: Arta Silence, MD;  [facility];  Service: Endoscopy;  Laterality: N/A;   ICD IMPLANT  10/14/2016   ICD IMPLANT N/A 10/14/2016   Procedure: ICD Implant;  Surgeon: Deboraha Sprang, MD;  Location: [place] [facility];  Service: Cardiovascular;  Laterality: N/A;   PRESSURE SENSOR/CARDIOMEMS N/A 09/10/2020   Procedure: PRESSURE SENSOR/CARDIOMEMS;  Surgeon: Larey Dresser, MD;  Location: [place] [facility];  Service: Cardiovascular;  Laterality: N/A;   RIGHT HEART CATH N/A 05/21/2020   Procedure: RIGHT HEART CATH;  Surgeon: Larey Dresser, MD;  Location: [place] [facility];  Service: Cardiovascular;  Laterality: N/A;   WRIST SURGERY      Social History:  Social History   Socioeconomic History   Marital status: Legally Separated    Spouse name: Not on file   Number of children: Not on file   Years of education: Not on file   Highest education level: Not on file  Occupational History   Not on file  Tobacco Use   Smoking status: Never   Smokeless tobacco: Never  Vaping Use   Vaping Use: Never used  Substance and Sexual Activity   Alcohol use: Yes    Alcohol/week: 1.0 - 2.0 standard drink    Types: 1 - 2 Glasses of wine per week    Comment: rare   Drug use: No   Sexual activity: Yes  Other Topics Concern   Not on file  Social History Narrative   Not on file   Social Determinants  of Health   Financial Resource Strain: Medium Risk   Difficulty of Paying Living Expenses: Somewhat hard  Food Insecurity: No Food Insecurity   Worried About Charity fundraiser in the Last Year: Never true   Ran Out of Food in the Last Year: Never true  Transportation Needs: No Transportation Needs   Lack of Transportation (Medical): No   Lack of Transportation (Non-Medical): No  Physical Activity: Not on file  Stress: Not on file  Social Connections: Not on file  Intimate Partner Violence: Not on file    Family History:  Family History  Problem Relation Age of Onset   Hypertension Mother    Healthy Father    Stroke Father     Medications:   Current Outpatient Medications on File Prior to Visit  Medication Sig Dispense Refill   acetaminophen (TYLENOL) 325 MG tablet Take 2 tablets (650 mg total) by mouth every 4 (four) hours as needed  for mild pain (or temp > 37.5 C (99.5 F)).     amiodarone (PACERONE) 200 MG tablet Take 1 tablet (200 mg total) by mouth 2 (two) times daily. 60 tablet 6   apixaban (ELIQUIS) 5 MG TABS tablet TAKE 1 TABLET(5 MG) BY MOUTH TWICE DAILY 60 tablet 11   atorvastatin (LIPITOR) 80 MG tablet Take 1 tablet (80 mg total) by mouth at bedtime. 30 tablet 11   carvedilol (COREG) 25 MG tablet Take 1 tablet (25 mg total) by mouth 2 (two) times daily. 180 tablet 3   dapagliflozin propanediol (FARXIGA) 10 MG TABS tablet Take 1 tablet (10 mg total) by mouth daily before breakfast. 90 tablet 3   dicyclomine (BENTYL) 20 MG tablet Take 1 tablet (20 mg total) by mouth 2 (two) times daily. 60 tablet 0   famotidine (PEPCID) 20 MG tablet Take 1 tablet (20 mg total) by mouth 2 (two) times daily. 30 tablet 0   fenofibrate 54 MG tablet Take 1 tablet (54 mg total) by mouth daily. 90 tablet 0   hydrALAZINE (APRESOLINE) 100 MG tablet Take 1 tablet (100 mg total) by mouth 3 (three) times daily. 90 tablet 0   insulin glargine (LANTUS SOLOSTAR) 100 UNIT/ML Solostar Pen Inject 35 Units  into the skin 2 (two) times daily. 15 mL 11   isosorbide mononitrate (IMDUR) 60 MG 24 hr tablet Take 1 tablet (60 mg total) by mouth daily. 30 tablet 11   isosorbide mononitrate (IMDUR) 60 MG 24 hr tablet Take 1 tablet (60 mg total) by mouth daily. 30 tablet 6   latanoprost (XALATAN) 0.005 % ophthalmic solution Place 1 drop into both eyes at bedtime.     meclizine (ANTIVERT) 25 MG tablet Take 1 tablet (25 mg total) by mouth 3 (three) times daily as needed for dizziness. 90 tablet 3   pantoprazole (PROTONIX) 40 MG tablet Take 1 tablet (40 mg total) by mouth 2 (two) times daily. 180 tablet 3   sertraline (ZOLOFT) 50 MG tablet Take 1 tablet (50 mg total) by mouth daily. 30 tablet 0   VASCEPA 1 g capsule Take 2 capsules (2 g total) by mouth 2 (two) times daily. 120 capsule 5   furosemide (LASIX) 20 MG tablet Take 2 tablets (40 mg total) by mouth daily. 60 tablet 0   No current facility-administered medications on file prior to visit.    Allergies:   Allergies  Allergen Reactions   Lisinopril Cough      OBJECTIVE:  Physical Exam  Vitals:   11/18/20 1304  BP: 118/68  Pulse: 67  Weight: 231 lb (104.8 kg)  Height: '5\' 10"'$  (1.778 m)   Body mass index is 33.15 kg/m. No results found.  General: well developed, well nourished, very pleasant middle-aged African-American male, seated, in no evident distress Head: head normocephalic and atraumatic.   Neck: supple with no carotid or supraclavicular bruits Cardiovascular: regular rate and rhythm, no murmurs Musculoskeletal: no deformity Skin:  no rash/petichiae Vascular:  Normal pulses all extremities   Neurologic Exam Mental Status: Awake and fully alert. Mute and deaf. Oriented to place and time. Recent and remote memory intact. Attention span, concentration and fund of knowledge appropriate. Mood and affect appropriate.  Cranial Nerves: Fundoscopic exam reveals sharp disc margins. Pupils equal, briskly reactive to light. Extraocular  movements full without nystagmus. Visual fields full to confrontation. Facial sensation intact. Face, tongue, palate moves normally and symmetrically.  Motor: Normal bulk and tone. Normal strength in all tested extremity muscles Sensory.:  intact to touch , pinprick , position and vibratory sensation.  Coordination: Rapid alternating movements normal in all extremities. Finger-to-nose and heel-to-shin performed accurately bilaterally. Gait and Station: Arises from chair without difficulty. Stance is normal. Gait demonstrates normal stride length and balance without use of assistive device.  Unable to complete tandem walk and heel toe Reflexes: 1+ and symmetric. Toes downgoing.     NIHSS  0 Modified Rankin  0      ASSESSMENT: Troy Rush is a 50 y.o. year old male with suspected small left subcortical infarct due to small vessel disease on 09/30/2020 after presenting with right-sided weakness and sensory deficit in patient with atrial fibrillation and cardiomyopathy on Eliquis.  Vascular risk factors include HTN, HLD, DM, CAD, OSA, CHF, A. fib on Eliquis.      PLAN:  Ischemic stroke:  Recovered well without residual deficit.   Continue Eliquis (apixaban) daily  and atorvastatin, fenofibrate and Vascepa for secondary stroke prevention.   Discussed secondary stroke prevention measures and importance of close PCP follow up for aggressive stroke risk factor management. I have gone over the pathophysiology of stroke, warning signs and symptoms, risk factors and their management in some detail with instructions to go to the closest emergency room for symptoms of concern. Atrial fibrillation: On Eliquis 5 mg twice daily for CHA2DS2-VASc score of 6.  Routinely followed by cardiology HTN: BP goal <130/90.  Stable today on current regimen per PCP. Low BP this AM quickly resolved after salt and fluids.  Advised to continue to monitor at home and routinely follow-up with cardiology HLD: LDL goal <70.  Recent LDL direct 189.9 - continue atorvastatin '80mg'$  daily, fenofibrate and zetia - repeat lipid panel today DMII: A1c goal<7.0. Recent A1c 9.7. discussed importance of routinely monitoring glucose levels at home as advised by PCP.     Follow up in 6 months or call earlier if needed   CC:  [facility] provider: Dr. Leonie Man PCP: Vevelyn Francois, NP    I spent 47 minutes of face-to-face and non-face-to-face time with patient assisted by interpreter for ALS.  This included previsit chart review including review of recent hospitalization, lab review, study review, order entry, electronic health record documentation, patient education and discussion regarding recent stroke and etiology, secondary stroke prevention measures and importance of managing stroke risk factors and answered all other questions to patients satisfaction   Frann Rider, AGNP-BC  [facility] [address] [place], [place] [address]  Phone [phone] Fax [phone] Note: This document was prepared with digital dictation and possible smart phrase technology. Any transcriptional errors that result from this process are unintentional.

## 2020-11-19 ENCOUNTER — Other Ambulatory Visit (HOSPITAL_COMMUNITY): Payer: Self-pay

## 2020-11-19 LAB — LIPID PANEL
Chol/HDL Ratio: 7.8 ratio — ABNORMAL HIGH (ref 0.0–5.0)
Cholesterol, Total: 326 mg/dL — ABNORMAL HIGH (ref 100–199)
HDL: 42 mg/dL (ref >39)
LDL Chol Calc (NIH): 205 mg/dL — ABNORMAL HIGH (ref 0–99)
Triglycerides: 384 mg/dL — ABNORMAL HIGH (ref 0–149)
VLDL Cholesterol Cal: 79 mg/dL — ABNORMAL HIGH (ref 5–40)

## 2020-11-19 NOTE — Progress Notes (Addendum)
Paramedicine Encounter    Patient ID: Troy Rush, male    DOB: [birth year], 50 y.o.   MRN: [id number]   Patient Care Team: Vevelyn Francois, NP as PCP - General (Adult Health Nurse Practitioner) Larey Dresser, MD as PCP - Cardiology (Cardiology) Conrad [place], NP as Nurse Practitioner (Cardiology) Larey Dresser, MD as Consulting Physician (Cardiology) Jorge Ny, LCSW as Social Worker (Licensed Clinical Social Worker)  Patient Active Problem List   Diagnosis Date Noted   Subcortical infarction [facility]) 10/03/2020   Cerebral thrombosis with cerebral infarction 10/01/2020   Unilateral weakness 09/30/2020   History of atrial fibrillation 09/30/2020   History of DVT (deep vein thrombosis) 09/30/2020   CKD (chronic kidney disease) stage 3, GFR 30-59 ml/min ([place]) 07/08/2020   Acute CHF (congestive heart failure) ([place]) 07/07/2020   CKD (chronic kidney disease), stage IV ([place]) 07/07/2020   ICD (implantable cardioverter-defibrillator) in place 05/14/2020   Pulmonary embolism ([place]) 02/05/2020   Acute kidney injury superimposed on CKD ([place])    Chronic combined systolic and diastolic heart failure ([place])    Atrial fibrillation with RVR ([place]) 02/01/2020   NSVT (nonsustained ventricular tachycardia) ([place]) 02/01/2020   Acute GI bleeding 02/01/2020   DKA (diabetic ketoacidosis) ([place]) 02/01/2020   Bilateral deafness 08/15/2019   Type 2 diabetes mellitus with complication, with long-term current use of insulin ([place]) 08/15/2019   Hemoglobin A1C between 7% and 9% indicating borderline diabetic control 08/15/2019   Insomnia 08/15/2019   Abdominal pain 11/14/2018   Ischemic cardiomyopathy 10/14/2016   NICM (nonischemic cardiomyopathy) ([place]) 123XX123   Chronic systolic CHF (congestive heart failure) ([place]) 05/30/2016   HTN (hypertension) 05/30/2016   Deaf 05/30/2016   Hyperkalemia 05/30/2016   Major depressive disorder, recurrent episode ([place]) 03/21/2016   Bronchitis 01/21/2015    Dyslipidemia 01/21/2015   Vitamin D deficiency 12/19/2011   Personal history of other infectious and parasitic diseases 10/25/2011   Anxiety disorder 05/12/2011   Hyperlipidemia 05/12/2011   Hypertriglyceridemia 05/12/2011   Obstructive sleep apnea 05/12/2011    Current Outpatient Medications:    acetaminophen (TYLENOL) 325 MG tablet, Take 2 tablets (650 mg total) by mouth every 4 (four) hours as needed for mild pain (or temp > 37.5 C (99.5 F))., Disp: , Rfl:    amiodarone (PACERONE) 200 MG tablet, Take 1 tablet (200 mg total) by mouth 2 (two) times daily., Disp: 60 tablet, Rfl: 6   apixaban (ELIQUIS) 5 MG TABS tablet, TAKE 1 TABLET(5 MG) BY MOUTH TWICE DAILY, Disp: 60 tablet, Rfl: 11   atorvastatin (LIPITOR) 80 MG tablet, Take 1 tablet (80 mg total) by mouth at bedtime., Disp: 30 tablet, Rfl: 11   carvedilol (COREG) 25 MG tablet, Take 1 tablet (25 mg total) by mouth 2 (two) times daily., Disp: 180 tablet, Rfl: 3   dapagliflozin propanediol (FARXIGA) 10 MG TABS tablet, Take 1 tablet (10 mg total) by mouth daily before breakfast., Disp: 90 tablet, Rfl: 3   fenofibrate 54 MG tablet, Take 1 tablet (54 mg total) by mouth daily., Disp: 90 tablet, Rfl: 0   hydrALAZINE (APRESOLINE) 100 MG tablet, Take 1 tablet (100 mg total) by mouth 3 (three) times daily., Disp: 90 tablet, Rfl: 0   insulin glargine (LANTUS SOLOSTAR) 100 UNIT/ML Solostar Pen, Inject 35 Units into the skin 2 (two) times daily., Disp: 15 mL, Rfl: 11   isosorbide mononitrate (IMDUR) 60 MG 24 hr tablet, Take 1 tablet (60 mg total) by mouth daily., Disp: 30 tablet, Rfl: 11  latanoprost (XALATAN) 0.005 % ophthalmic solution, Place 1 drop into both eyes at bedtime., Disp: , Rfl:    meclizine (ANTIVERT) 25 MG tablet, Take 1 tablet (25 mg total) by mouth 3 (three) times daily as needed for dizziness., Disp: 90 tablet, Rfl: 3   pantoprazole (PROTONIX) 40 MG tablet, Take 1 tablet (40 mg total) by mouth 2 (two) times daily., Disp: 180 tablet,  Rfl: 3   sertraline (ZOLOFT) 50 MG tablet, Take 1 tablet (50 mg total) by mouth daily., Disp: 30 tablet, Rfl: 0   VASCEPA 1 g capsule, Take 2 capsules (2 g total) by mouth 2 (two) times daily., Disp: 120 capsule, Rfl: 5   dicyclomine (BENTYL) 20 MG tablet, Take 1 tablet (20 mg total) by mouth 2 (two) times daily. (Patient not taking: Reported on 11/19/2020), Disp: 60 tablet, Rfl: 0   famotidine (PEPCID) 20 MG tablet, Take 1 tablet (20 mg total) by mouth 2 (two) times daily. (Patient not taking: Reported on 11/19/2020), Disp: 30 tablet, Rfl: 0   furosemide (LASIX) 20 MG tablet, Take 2 tablets (40 mg total) by mouth daily., Disp: 60 tablet, Rfl: 0   isosorbide mononitrate (IMDUR) 60 MG 24 hr tablet, Take 1 tablet (60 mg total) by mouth daily. (Patient not taking: Reported on 11/19/2020), Disp: 30 tablet, Rfl: 6 Allergies  Allergen Reactions   Lisinopril Cough      Social History   Socioeconomic History   Marital status: Legally Separated    Spouse name: Not on file   Number of children: Not on file   Years of education: Not on file   Highest education level: Not on file  Occupational History   Not on file  Tobacco Use   Smoking status: Never   Smokeless tobacco: Never  Vaping Use   Vaping Use: Never used  Substance and Sexual Activity   Alcohol use: Yes    Alcohol/week: 1.0 - 2.0 standard drink    Types: 1 - 2 Glasses of wine per week    Comment: rare   Drug use: No   Sexual activity: Yes  Other Topics Concern   Not on file  Social History Narrative   Not on file   Social Determinants of Health   Financial Resource Strain: Medium Risk   Difficulty of Paying Living Expenses: Somewhat hard  Food Insecurity: No Food Insecurity   Worried About Charity fundraiser in the Last Year: Never true   Ran Out of Food in the Last Year: Never true  Transportation Needs: No Transportation Needs   Lack of Transportation (Medical): No   Lack of Transportation (Non-Medical): No  Physical  Activity: Not on file  Stress: Not on file  Social Connections: Not on file  Intimate Partner Violence: Not on file    Physical Exam      Future Appointments  Date Time Provider [place]  11/25/2020  9:30 AM [facility] PA/NP [facility] None  12/11/2020  1:40 PM Vevelyn Francois, NP [facility] None  12/21/2020  8:35 AM [facility] DEVICE REMOTES [facility]  03/22/2021  8:35 AM [facility] DEVICE REMOTES [facility]  05/27/2021  8:45 AM Frann Rider, NP [facility] None  06/21/2021  8:35 AM [facility] DEVICE REMOTES [facility]  09/20/2021  8:35 AM [facility] DEVICE REMOTES [facility]  12/20/2021  8:35 AM [facility] DEVICE REMOTES [facility]    BP 140/84   Pulse 80   Resp 18   Wt 232 lb (105.2 kg)   BMI 33.29 kg/m  Weight yesterday-231 Last visit weight-237  Pt reports feeling ok, he has had some dizzy spells and his b/p have dropped lower side. He drinks water and takes a taste of salt and it makes him feel better.  He went to see neuro yesterday. F/u in 102mhs or sooner if needed.  Pt has not taken a lot of his meds. His insulin has increased to 35U BID and he has f/u with Dr. KEdison Paceon 9/2.  Meds verified-2 pill boxes refilled.  (His roommate was quite intoxicated when I was there this morning.)  KMarylouise Stacks ENorth PlymouthParamedic  11/19/20

## 2020-11-23 NOTE — Progress Notes (Signed)
I agree with the above plan 

## 2020-11-24 ENCOUNTER — Telehealth: Payer: Self-pay

## 2020-11-24 ENCOUNTER — Encounter (HOSPITAL_COMMUNITY): Payer: Medicare Other

## 2020-11-24 NOTE — Progress Notes (Signed)
Advanced Heart Failure Clinic Note   PCP: Troy Becton, FNP HF Cardiology: Troy Rush  Reason for Visit: f/u for chronic systolic heart failure   [name] is a 50 year old with a history of with deafness, HTN, DM, and chronic systolic HF.   Patient had no known cardiac problems until 10/16.  At that time, he developed exertional dyspnea and was admitted to a hospital in [place], [place] with acute systolic CHF.  Echo 10/16 showed EF 15-20%.  Cardiac cath showed no significant coronary disease.  He was diuresed and started on cardiac meds. Subsequently, he moved to [facility].  Now working in a [employer].  Repeat echo in 5/18 showed persistently low EF, 25-30%.  He had Troy Rush placed.   He was admitted briefly with CHF exacerbation in 8/20.    Echo in 9/20 showed EF 30-35% with diffuse hypokinesis.   Ultrasound in 4/21 showed right leg DVT and CT showed a PE.  No obvious trigger (no long trip, no surgery, no prolonged immobility).  He was started on apixaban.   He quit taking his meds in 8/21 and was drinking heavily due to depression, later restarted meds.  In 10/21, he was admitted with DKA and atrial fibrillation/RVR (1st known episode).  He converted to NSR spontaneously.  Troy Rush was stopped due to AKI.  He had melena/hematochezia and EGD showed gastritis/esophagitis.  Apixaban was stopped.  He went home but then was re-admitted later in 10/21 with bilateral PEs.   Echo in 10/21 showed EF 45%, mild global hypokinesis, moderate LVH, mildly decreased RV systolic function.    On 05/05/20, he had a near-syncopal event and went to the ER, no events on ICD interrogation.  ?Orthostatic.  No further dizziness/lightheadedness. He is working at [employer] in [employer].  Able to do his job without dyspnea, but he gets short of breath after walking about 50 feet.  Sleepy during the day. Sleep study ordered.  Repeat RHC (2/22) showed ptimized filling pressures, mild pulmonary  hypertension, preserved cardiac output. Suspect elevated PA pressure may be related to OSA.    Admitted [id number]  With A/C systolic heart failure. Diuresed with IV lasix and transitioned to lasix 60 mg daily. Discharge weight 232 pounds.   09/10/20 he presented for scheduled cardiomems and was noted to be in A fib RVR. RHC with succesful cardiomems implant. Stable hemodynamics on cath. Admitted from cath lab with A fib RVR and started on amio drip. Chemically converted to NSR was transitioned to amio 200 mg twice a day. Also incidentally found to be COVID +.   09/30/20, he was admitted w/ acute subcortical CVA. Unable to get cMRI given ICD. Neurology felt related to small vessel disease. Eliquis continued. He was d/c to CIR for rehab. Now back at home. Also of note, per chart review, his Wilder Glade was discontinued on 6/26 b/c GFR was "too low" at 29. Most recent hgb A1c was 9.7.   Today he returns for HF follow up with paramedicine. He missed a whole weeks worth of medicine last week, said he was tired and forgot to take it. Has not been doing CardioMems since 11/03/20, says it takes 30 minutes to use. Some dizziness when his BP is low. Denies CP, edema, or PND/Orthopnea. Appetite ok. No fever or chills. Weight at home 234 pounds. 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  Labs (9/17):  BNP 477, K 4.5 => 4.2, creatinine 0.92 => 1.1, BNP 1009, HIV negative, SPEP negative.  Labs (10/17): K 5.2, creatinine 1.10, hgb 12.5 Labs (03/28/2016): K 5.3 Creatinine 1.07  Labs (3/18): digoxin < 0.2, K 4.1, creatinine 0.97 Labs (9/18): hgb 12.2, K 4, creatinine 0.99, BNP 139 Labs (1/19): hgb 11.2, K 4.8, creatinine 1.26 Labs (8/20): K 3.7, creatinine 1.69 Labs (5/21): K 5, creatinine 1.4, LDL 192 Labs (10/21): K 4.1, creatinine 1.77, LDL 180, TGs 138 Labs (11/21): K 4.5, creatinine 2.68 Labs (12/21): K 5.1,  creatinine 2.25 Labs (04/30/20): K 4.7, creatinine 2.7 Labs (05/05/20): K 4.0, creatinine 2.15 Labs (05/11/20): K 4.2, creatinine 2.43, LDL 76, HDL 38, TGs 220 Labs (2/22): K 4.4, creatinine 2.69 Labs (2/22): SCr 2.56, GFR 29, K 3.9  Labs (8/22): K 5.0, creatinine 2.84, LDL 205, HDL 42, TGs 384  PMH: 1. HTN 2. Diabetes 3. Hyperlipidemia 4. Deafness since age 27: Needs sign language interpreter.  5. Chronic systolic CHF: Nonischemic cardiomyopathy.  Diagnosis 10/16 in [place], [place] (admitted with acute systolic CHF).  SPEP and HIV negative.  [place].  - Echo (10/16): EF 15-20%, mild LVH, mild MR, normal RV size and systolic function.  - LHC (10/16): No significant CAD.  - CPX (12/17): Mild functional limitation.  - 04/2016 CMRI: EF 27%. RV normal. No evidence of infiltrative disease, myocarditis, or MI.  - Echo (5/18): EF 25-30%, mild LV dilation, mild LVH, normal RV size with mildly decreased - Echo (9/20): EF 30-35%, mild LVH, diffuse hypokinesis, normal RV size and systolic function.  - Echo (10/21): EF 45%, mild global hypokinesis, moderate LVH, mildly decreased RV systolic function.  - RHC (2/22): Optimized filling pressures, mild pulmonary hypertension, preserved cardiac output. Continue current diuretics.  Suspect elevated PA pressure may be related to OSA.   6. Depression. 7. Colitis episodes.  8. CKD stage 3.  9. DVT/PE in 4/21: No obvious trigger.  - Recurrent PE in 10/21, had been off anticoagulation.  10. Atrial fibrillation: Paroxysmal.  11. Gastritis/esophagitis: 10/21.  12. ETOH abuse  SH: From [place] originally.  Lives in [place] until 2017, then moved to [place].  Nonsmoker.  Unemployed.  H/o ETOH abuse.    FH: No known cardiac disease.   ROS: All systems reviewed and negative except as per HPI.   Current Outpatient Medications  Medication Sig Dispense Refill   acetaminophen (TYLENOL) 325 MG tablet Take 2 tablets (650 mg total) by mouth every 4 (four)  hours as needed for mild pain (or temp > 37.5 C (99.5 F)).     amiodarone (PACERONE) 200 MG tablet Take 1 tablet (200 mg total) by mouth 2 (two) times daily. 60 tablet 6   apixaban (ELIQUIS) 5 MG TABS tablet TAKE 1 TABLET(5 MG) BY MOUTH TWICE DAILY 60 tablet 11   atorvastatin (LIPITOR) 80 MG tablet Take 1 tablet (80 mg total) by mouth at bedtime. 30 tablet 11   carvedilol (COREG) 25 MG tablet Take 1 tablet (25 mg total) by mouth 2 (two) times daily. 180 tablet 3   dapagliflozin propanediol (FARXIGA) 10 MG TABS tablet Take 1 tablet (10 mg total) by mouth daily before breakfast. 90 tablet 3   fenofibrate 54 MG tablet Take 1 tablet (54 mg total) by mouth daily. 90 tablet 0   furosemide (LASIX) 20 MG tablet Take 2 tablets (40 mg total) by mouth daily. 60 tablet 0   insulin glargine (LANTUS SOLOSTAR) 100 UNIT/ML Solostar Pen Inject 35 Units into the skin  2 (two) times daily. 15 mL 11   isosorbide mononitrate (IMDUR) 60 MG 24 hr tablet Take 1 tablet (60 mg total) by mouth daily. 30 tablet 11   latanoprost (XALATAN) 0.005 % ophthalmic solution Place 1 drop into both eyes at bedtime.     meclizine (ANTIVERT) 25 MG tablet Take 1 tablet (25 mg total) by mouth 3 (three) times daily as needed for dizziness. 90 tablet 3   pantoprazole (PROTONIX) 40 MG tablet Take 1 tablet (40 mg total) by mouth 2 (two) times daily. 180 tablet 3   sertraline (ZOLOFT) 50 MG tablet Take 1 tablet (50 mg total) by mouth daily. 30 tablet 0   VASCEPA 1 g capsule Take 2 capsules (2 g total) by mouth 2 (two) times daily. 120 capsule 5   No current facility-administered medications for this encounter.   BP 102/66   Pulse 71   Wt 106.4 kg (234 lb 9.6 oz)   SpO2 96%   BMI 33.66 kg/m    Wt Readings from Last 3 Encounters:  11/25/20 106.4 kg (234 lb 9.6 oz)  11/19/20 105.2 kg (232 lb)  11/18/20 104.8 kg (231 lb)    PHYSICAL EXAM: General:  NAD. No resp difficulty HEENT: Deaf Neck: Supple. JVP 8. Carotids 2+ bilat; no bruits.  No lymphadenopathy or thryomegaly appreciated. Cor: PMI nondisplaced. Regular rate & rhythm. No rubs, gallops or murmurs. Lungs: Clear Abdomen: Obese, nontender, nondistended. No hepatosplenomegaly. No bruits or masses. Good bowel sounds. Extremities: No cyanosis, clubbing, rash, edema Neuro: Alert & oriented x 3, cranial nerves grossly intact. Moves all 4 extremities w/o difficulty. Affect pleasant.  Assessment/Plan: 1. Chronic systolic CHF: Nonischemic cardiomyopathy, etiology uncertain. Consider prior ETOH or HTN.  He has a [place].  cMRI in 1/18 with EF 27%, RV normal, no evidence of infiltrative disease, myocarditis, or MI. Echo in 9/20 showed EF 30-35%.  Echo in 10/21 showed EF up to 45% with mild RV dysfunction.  Repeat RHC (2/22) with optimized filling pressures, preserved CO and moderate pulmonary hypertension-->likely due to untreated sleep apnea. Still awaiting CPAP device. Cardiomems placed 6/22. RHC at time of placement: RA 8, PCWP 9, CI 1.93 (Fick), 2.7 (Thermo). NYHA II, he is mildly volume overloaded one exam, likely in the setting of missing a week's worth of his medication. - He has been enrolled in monthly fluid monitoring with the device clinic. - Increase lasix to 40 mg bid x 2 days then back to 40 mg daily. BMET & BNP today, repeat BMET in 10 days. - Continue Farxiga 10 mg daily. - Continue Coreg 25 mg bid.    - Continue hydralazine 100 mg tid + Imdur 60 mg daily. - Off Entresto and spironolactone with elevated creatinine.  - Advised to improve compliance w/ cardiomems.  2. Type II diabetes: management per PCP. Continue Lantus. He does not check his blood sugars at home. Most recent Hgb A1c 9.7.  - On Farxiga. 3. HTN: Controlled. - Continue meds as above. 4. Deafness: Sign language interpreter present at visit.  5. CKD: Stage 3.  - BMET today. - Continue Wilder Glade as long as GFR > 25 - He has been referred to nephrology. Has appt 12/04/20. 6. PE/DVT: 4/21.   Unclear cause, no prolonged trip, prolonged immobility, or history of malignancy. No FH clotting. Recurrent PE in 10/21 while Eliquis on hold with bleeding from esophagitis/gastritis.  Do no think this was an Eliquis failure.  - Continue apixaban, think he will need long-term with no  clear trigger for VTE + AFib. No bleeding issues.  7. Hyperlipidemia: On atorvastatin, fenofibrate and Vascepa. Lipids 11/18/20 LDL not at goal of <70. Will refer to Lipid Clinic ? Repatha. 8. Atrial fibrillation: First noted in setting of DKA in 10/21.  Had been on amiodarone but discontinued 5/22. Had recurrent Afib 6/22. - Given recurrence will refer to EP for ablation.  - Regular on exam.  - Continue Eliquis. No bleeding. 9. ETOH abuse: ETOH may have played a role in both cardiomyopathy and atrial fibrillation.  No longer drinking alcohol, lives with roommate who regularly drinks ETOH.  10. Severe OSA: recent sleep study (AHI = 125.7/h). Followed by Dr. Radford Pax. Awaiting CPAP device, they are backed up. Paramedicine is assiting with device.   11. Noncompliance:  Previously doing well with medication adherence but missed his medications last week. - Continue HF Paramedicine.  12. CVA: 6/22, subcortical CVA. Unable to get cMRI given ICD. Neurology felt related to small vessel disease. No residual deficits - Continue Eliquis.   Follow up in 6 weeks with APP. I have asked Katie with paramedicine to help troubleshoot cardiomems pillow.   [place], FNP-BC 11/25/2020

## 2020-11-24 NOTE — Telephone Encounter (Signed)
Contacted pt, spoke to his sign interpreter to leave a message to either give [place] a call back or review results via mychart.

## 2020-11-24 NOTE — Telephone Encounter (Signed)
-----   Message from Frann Rider, NP sent at 11/24/2020  7:43 AM EDT ----- MyChart message sent but has yet to be read. Can you please call with this information. Thank you!   Your recent cholesterol levels did show some improvement but still greatly elevated which increases your risk of additional strokes in the future. Please ensure you are taking all of your cholesterol medications including atorvastatin 80 mg daily, fenofibrate and Vascepa. If you have been taking all as prescribed, we may need to consider initiating a twice monthly injectable medication called Repatha or Praluent. Starting one of these injectable medications, we may be able to take you off some of your oral cholesterol medications if your levels come down enough.  If you are interested in starting this, please let me know and I can provide you with more information or you can further discuss with your cardiologist.  Please let me know if you have any other questions or concerns.

## 2020-11-24 NOTE — Telephone Encounter (Signed)
Not a problem. Sent this information to [place], [place] at [facility] cardiology for [employer]

## 2020-11-24 NOTE — Telephone Encounter (Signed)
Went over results with pt again via interpreter to give clarity, he stated he does see cardio tomorrow and will mention this to them and let us know what they say. Also, are you abe to send this to his cardio so they will know exactly what is going on and what is needed?  Pt was appreciative for the clarity and assistance.

## 2020-11-25 ENCOUNTER — Other Ambulatory Visit: Payer: Self-pay

## 2020-11-25 ENCOUNTER — Other Ambulatory Visit (HOSPITAL_COMMUNITY): Payer: Self-pay

## 2020-11-25 ENCOUNTER — Encounter (HOSPITAL_COMMUNITY): Payer: Self-pay

## 2020-11-25 ENCOUNTER — Ambulatory Visit (HOSPITAL_COMMUNITY)
Admission: RE | Admit: 2020-11-25 | Discharge: 2020-11-25 | Disposition: A | Discharge status: Home / Self Care | Payer: Medicare Other | Source: Ambulatory Visit | Attending: Family Medicine | Admitting: Family Medicine

## 2020-11-25 VITALS — BP 102/66 | HR 71 | Wt 234.6 lb

## 2020-11-25 DIAGNOSIS — E1122 Type 2 diabetes mellitus with diabetic chronic kidney disease: Secondary | ICD-10-CM | POA: Insufficient documentation

## 2020-11-25 DIAGNOSIS — Z7901 Long term (current) use of anticoagulants: Secondary | ICD-10-CM | POA: Insufficient documentation

## 2020-11-25 DIAGNOSIS — E785 Hyperlipidemia, unspecified: Secondary | ICD-10-CM | POA: Diagnosis not present

## 2020-11-25 DIAGNOSIS — F32A Depression, unspecified: Secondary | ICD-10-CM | POA: Insufficient documentation

## 2020-11-25 DIAGNOSIS — Z86711 Personal history of pulmonary embolism: Secondary | ICD-10-CM | POA: Diagnosis not present

## 2020-11-25 DIAGNOSIS — G4733 Obstructive sleep apnea (adult) (pediatric): Secondary | ICD-10-CM | POA: Diagnosis not present

## 2020-11-25 DIAGNOSIS — Z794 Long term (current) use of insulin: Secondary | ICD-10-CM | POA: Insufficient documentation

## 2020-11-25 DIAGNOSIS — H9190 Unspecified hearing loss, unspecified ear: Secondary | ICD-10-CM | POA: Insufficient documentation

## 2020-11-25 DIAGNOSIS — Z79899 Other long term (current) drug therapy: Secondary | ICD-10-CM | POA: Insufficient documentation

## 2020-11-25 DIAGNOSIS — I5022 Chronic systolic (congestive) heart failure: Secondary | ICD-10-CM | POA: Insufficient documentation

## 2020-11-25 DIAGNOSIS — N183 Chronic kidney disease, stage 3 unspecified: Secondary | ICD-10-CM | POA: Insufficient documentation

## 2020-11-25 DIAGNOSIS — Z95810 Presence of automatic (implantable) cardiac defibrillator: Secondary | ICD-10-CM | POA: Diagnosis not present

## 2020-11-25 DIAGNOSIS — E118 Type 2 diabetes mellitus with unspecified complications: Secondary | ICD-10-CM | POA: Diagnosis not present

## 2020-11-25 DIAGNOSIS — I480 Paroxysmal atrial fibrillation: Secondary | ICD-10-CM | POA: Diagnosis not present

## 2020-11-25 DIAGNOSIS — F1011 Alcohol abuse, in remission: Secondary | ICD-10-CM

## 2020-11-25 DIAGNOSIS — H9193 Unspecified hearing loss, bilateral: Secondary | ICD-10-CM

## 2020-11-25 DIAGNOSIS — I130 Hypertensive heart and chronic kidney disease with heart failure and stage 1 through stage 4 chronic kidney disease, or unspecified chronic kidney disease: Secondary | ICD-10-CM | POA: Insufficient documentation

## 2020-11-25 DIAGNOSIS — I10 Essential (primary) hypertension: Secondary | ICD-10-CM | POA: Diagnosis not present

## 2020-11-25 DIAGNOSIS — Z86718 Personal history of other venous thrombosis and embolism: Secondary | ICD-10-CM | POA: Diagnosis not present

## 2020-11-25 DIAGNOSIS — N1832 Chronic kidney disease, stage 3b: Secondary | ICD-10-CM

## 2020-11-25 DIAGNOSIS — Z8616 Personal history of COVID-19: Secondary | ICD-10-CM | POA: Insufficient documentation

## 2020-11-25 DIAGNOSIS — Z8673 Personal history of transient ischemic attack (TIA), and cerebral infarction without residual deficits: Secondary | ICD-10-CM

## 2020-11-25 DIAGNOSIS — I428 Other cardiomyopathies: Secondary | ICD-10-CM | POA: Insufficient documentation

## 2020-11-25 DIAGNOSIS — Z91199 Patient's noncompliance with other medical treatment and regimen due to unspecified reason: Secondary | ICD-10-CM

## 2020-11-25 DIAGNOSIS — Z9119 Patient's noncompliance with other medical treatment and regimen: Secondary | ICD-10-CM | POA: Diagnosis not present

## 2020-11-25 LAB — BASIC METABOLIC PANEL
Anion gap: 11 (ref 5–15)
BUN: 60 mg/dL — ABNORMAL HIGH (ref 6–20)
CO2: 18 mmol/L — ABNORMAL LOW (ref 22–32)
Calcium: 9.3 mg/dL (ref 8.9–10.3)
Chloride: 104 mmol/L (ref 98–111)
Creatinine, Ser: 2.89 mg/dL — ABNORMAL HIGH (ref 0.61–1.24)
GFR, Estimated: 26 mL/min — ABNORMAL LOW (ref >60)
Glucose, Bld: 183 mg/dL — ABNORMAL HIGH (ref 70–99)
Potassium: 5.1 mmol/L (ref 3.5–5.1)
Sodium: 133 mmol/L — ABNORMAL LOW (ref 135–145)

## 2020-11-25 NOTE — Patient Instructions (Signed)
INCREASE Lasix to 40 mg twice a day for 2 days, then resume normal dose of 40 mg daily thereafter  Labs today We will only contact you if something comes back abnormal or we need to make some changes. Otherwise no news is good news!  Labs needed in 10 days  You have been referred to [facility] -they will be in contact with an appointment  Your physician recommends that you schedule a follow-up appointment in: 6 weeks  in the Advanced Practitioners (PA/NP) Clinic    Do the following things EVERYDAY: Weigh yourself in the morning before breakfast. Write it down and keep it in a log. Take your medicines as prescribed Eat low salt foods--Limit salt (sodium) to 2000 mg per day.  Stay as active as you can everyday Limit all fluids for the day to less than 2 liters

## 2020-11-25 NOTE — Progress Notes (Signed)
Paramedicine Encounter   Patient ID: Troy Rush , male,   DOB: 18-Sep-1970,49 y.o.,  MRN: [id number]   Met patient in clinic today with provider.  Weight @ clinic-234 B/p-102/66 P-70 [id number]   He reports now taking meds, last week he had missed over a week worth of meds.  He is having trouble with doing his cardiomems and its taking a very long time to get the reading, so he hasnt been able to send over a transmission in over 2 wks.  He said the neurologist called him yesterday with lab results and his cholesterol is better but still needs more improvement-jessica will send him over to lipid clinic.  He is back to work about full time hours and seems to be tolerating work well.  His fluid level is up a little bit- His lasix will be increased for 2 days-28m BID for 2 days and then back to 485mdaily.   He is going to see kidney doc on 8/26.   Labs done today.  Labs done in 10 days.  He is due back in 6wks here in clinic.  I will see him in the morning to adjust the lasix dose and try the cardiomems.   KaMarylouise StacksEMOsterdock/17/2022

## 2020-11-26 ENCOUNTER — Other Ambulatory Visit (HOSPITAL_COMMUNITY): Payer: Self-pay

## 2020-11-26 NOTE — Telephone Encounter (Signed)
Thanks. He has been referred to our lipid clinic for [id number] therapy.

## 2020-11-30 NOTE — Progress Notes (Signed)
Came out for med rec-increase of his lasix for 2 days.  Also to try to trouble shoot his cardiomems device not working.  After an hour and half of speaking with tech team on the phone, it was determined that his pillow was malfunctioning, so they are sending him another one. Once that is received, they advised he should get it in 1-2 days we will have to pair it to him, so I will be back out next Thursday for that.  They are emailing me a return slip to take to [employer] to return the other device. They advised no shipping box was needed, it would be handled once I take it to the store. Will send that off next week.   [name], EMT-Paramedic  11/26/2020

## 2020-12-04 ENCOUNTER — Encounter: Payer: Self-pay | Admitting: *Deleted

## 2020-12-04 DIAGNOSIS — R809 Proteinuria, unspecified: Secondary | ICD-10-CM | POA: Diagnosis not present

## 2020-12-04 DIAGNOSIS — H9193 Unspecified hearing loss, bilateral: Secondary | ICD-10-CM | POA: Diagnosis not present

## 2020-12-04 DIAGNOSIS — I129 Hypertensive chronic kidney disease with stage 1 through stage 4 chronic kidney disease, or unspecified chronic kidney disease: Secondary | ICD-10-CM | POA: Diagnosis not present

## 2020-12-04 DIAGNOSIS — I5042 Chronic combined systolic (congestive) and diastolic (congestive) heart failure: Secondary | ICD-10-CM | POA: Diagnosis not present

## 2020-12-04 DIAGNOSIS — N184 Chronic kidney disease, stage 4 (severe): Secondary | ICD-10-CM | POA: Diagnosis not present

## 2020-12-04 DIAGNOSIS — E1122 Type 2 diabetes mellitus with diabetic chronic kidney disease: Secondary | ICD-10-CM | POA: Diagnosis not present

## 2020-12-07 ENCOUNTER — Ambulatory Visit (HOSPITAL_COMMUNITY)
Admission: RE | Admit: 2020-12-07 | Discharge: 2020-12-07 | Disposition: A | Discharge status: Home / Self Care | Payer: Medicare Other | Source: Ambulatory Visit | Attending: Internal Medicine | Admitting: Internal Medicine

## 2020-12-07 ENCOUNTER — Other Ambulatory Visit: Payer: Self-pay

## 2020-12-07 ENCOUNTER — Other Ambulatory Visit (HOSPITAL_COMMUNITY): Payer: Self-pay | Admitting: Family Medicine

## 2020-12-07 DIAGNOSIS — I5022 Chronic systolic (congestive) heart failure: Secondary | ICD-10-CM | POA: Diagnosis not present

## 2020-12-07 DIAGNOSIS — E785 Hyperlipidemia, unspecified: Secondary | ICD-10-CM

## 2020-12-07 LAB — BASIC METABOLIC PANEL
Anion gap: 5 (ref 5–15)
BUN: 31 mg/dL — ABNORMAL HIGH (ref 6–20)
CO2: 26 mmol/L (ref 22–32)
Calcium: 9 mg/dL (ref 8.9–10.3)
Chloride: 107 mmol/L (ref 98–111)
Creatinine, Ser: 2.35 mg/dL — ABNORMAL HIGH (ref 0.61–1.24)
GFR, Estimated: 33 mL/min — ABNORMAL LOW (ref >60)
Glucose, Bld: 166 mg/dL — ABNORMAL HIGH (ref 70–99)
Potassium: 4.9 mmol/L (ref 3.5–5.1)
Sodium: 138 mmol/L (ref 135–145)

## 2020-12-08 ENCOUNTER — Other Ambulatory Visit (HOSPITAL_COMMUNITY): Payer: Self-pay

## 2020-12-08 NOTE — Progress Notes (Signed)
Paramedicine Encounter    Patient ID: Troy Rush, male    DOB: 1970/10/19, 50 y.o.   MRN: [id number]   Patient Care Team: Vevelyn Francois, NP as PCP - General (Adult Health Nurse Practitioner) Larey Dresser, MD as PCP - Cardiology (Cardiology) Conrad [place], NP as Nurse Practitioner (Cardiology) Larey Dresser, MD as Consulting Physician (Cardiology) Jorge Ny, LCSW as Social Worker (Licensed Clinical Social Worker)  Patient Active Problem List   Diagnosis Date Noted   Subcortical infarction [facility]) 10/03/2020   Cerebral thrombosis with cerebral infarction 10/01/2020   Unilateral weakness 09/30/2020   History of atrial fibrillation 09/30/2020   History of DVT (deep vein thrombosis) 09/30/2020   CKD (chronic kidney disease) stage 3, GFR 30-59 ml/min ([place]) 07/08/2020   Acute CHF (congestive heart failure) ([place]) 07/07/2020   CKD (chronic kidney disease), stage IV ([place]) 07/07/2020   ICD (implantable cardioverter-defibrillator) in place 05/14/2020   Pulmonary embolism ([place]) 02/05/2020   Acute kidney injury superimposed on CKD ([place])    Chronic combined systolic and diastolic heart failure ([place])    Atrial fibrillation with RVR ([place]) 02/01/2020   NSVT (nonsustained ventricular tachycardia) ([place]) 02/01/2020   Acute GI bleeding 02/01/2020   DKA (diabetic ketoacidosis) ([place]) 02/01/2020   Bilateral deafness 08/15/2019   Type 2 diabetes mellitus with complication, with long-term current use of insulin ([place]) 08/15/2019   Hemoglobin A1C between 7% and 9% indicating borderline diabetic control 08/15/2019   Insomnia 08/15/2019   Abdominal pain 11/14/2018   Ischemic cardiomyopathy 10/14/2016   NICM (nonischemic cardiomyopathy) ([place]) 123XX123   Chronic systolic CHF (congestive heart failure) ([place]) 05/30/2016   HTN (hypertension) 05/30/2016   Deaf 05/30/2016   Hyperkalemia 05/30/2016   Major depressive disorder, recurrent episode ([place]) 03/21/2016   Bronchitis 01/21/2015    Dyslipidemia 01/21/2015   Vitamin D deficiency 12/19/2011   Personal history of other infectious and parasitic diseases 10/25/2011   Anxiety disorder 05/12/2011   Hyperlipidemia 05/12/2011   Hypertriglyceridemia 05/12/2011   Obstructive sleep apnea 05/12/2011    Current Outpatient Medications:    acetaminophen (TYLENOL) 325 MG tablet, Take 2 tablets (650 mg total) by mouth every 4 (four) hours as needed for mild pain (or temp > 37.5 C (99.5 F))., Disp: , Rfl:    amiodarone (PACERONE) 200 MG tablet, Take 1 tablet (200 mg total) by mouth 2 (two) times daily., Disp: 60 tablet, Rfl: 6   apixaban (ELIQUIS) 5 MG TABS tablet, TAKE 1 TABLET(5 MG) BY MOUTH TWICE DAILY, Disp: 60 tablet, Rfl: 11   atorvastatin (LIPITOR) 80 MG tablet, Take 1 tablet (80 mg total) by mouth at bedtime., Disp: 30 tablet, Rfl: 11   carvedilol (COREG) 25 MG tablet, Take 1 tablet (25 mg total) by mouth 2 (two) times daily., Disp: 180 tablet, Rfl: 3   dapagliflozin propanediol (FARXIGA) 10 MG TABS tablet, Take 1 tablet (10 mg total) by mouth daily before breakfast., Disp: 90 tablet, Rfl: 3   fenofibrate 54 MG tablet, Take 1 tablet (54 mg total) by mouth daily., Disp: 90 tablet, Rfl: 0   furosemide (LASIX) 20 MG tablet, Take 2 tablets (40 mg total) by mouth daily., Disp: 60 tablet, Rfl: 0   hydrALAZINE (APRESOLINE) 100 MG tablet, Take 100 mg by mouth 3 (three) times daily., Disp: , Rfl:    insulin glargine (LANTUS SOLOSTAR) 100 UNIT/ML Solostar Pen, Inject 35 Units into the skin 2 (two) times daily., Disp: 15 mL, Rfl: 11   isosorbide mononitrate (IMDUR) 60 MG 24  hr tablet, Take 1 tablet (60 mg total) by mouth daily., Disp: 30 tablet, Rfl: 11   latanoprost (XALATAN) 0.005 % ophthalmic solution, Place 1 drop into both eyes at bedtime., Disp: , Rfl:    meclizine (ANTIVERT) 25 MG tablet, Take 1 tablet (25 mg total) by mouth 3 (three) times daily as needed for dizziness., Disp: 90 tablet, Rfl: 3   pantoprazole (PROTONIX) 40 MG tablet,  Take 1 tablet (40 mg total) by mouth 2 (two) times daily., Disp: 180 tablet, Rfl: 3   sertraline (ZOLOFT) 50 MG tablet, Take 1 tablet (50 mg total) by mouth daily., Disp: 30 tablet, Rfl: 0   VASCEPA 1 g capsule, Take 2 capsules (2 g total) by mouth 2 (two) times daily., Disp: 120 capsule, Rfl: 5 Allergies  Allergen Reactions   Lisinopril Cough      Social History   Socioeconomic History   Marital status: Legally Separated    Spouse name: Not on file   Number of children: Not on file   Years of education: Not on file   Highest education level: Not on file  Occupational History   Not on file  Tobacco Use   Smoking status: Never   Smokeless tobacco: Never  Vaping Use   Vaping Use: Never used  Substance and Sexual Activity   Alcohol use: Yes    Alcohol/week: 1.0 - 2.0 standard drink    Types: 1 - 2 Glasses of wine per week    Comment: rare   Drug use: No   Sexual activity: Yes  Other Topics Concern   Not on file  Social History Narrative   Not on file   Social Determinants of Health   Financial Resource Strain: Medium Risk   Difficulty of Paying Living Expenses: Somewhat hard  Food Insecurity: No Food Insecurity   Worried About Charity fundraiser in the Last Year: Never true   Ran Out of Food in the Last Year: Never true  Transportation Needs: No Transportation Needs   Lack of Transportation (Medical): No   Lack of Transportation (Non-Medical): No  Physical Activity: Not on file  Stress: Not on file  Social Connections: Not on file  Intimate Partner Violence: Not on file    Physical Exam      Future Appointments  Date Time Provider [place]  12/11/2020  1:40 PM Vevelyn Francois, NP [facility] None  12/21/2020  8:35 AM [facility] DEVICE REMOTES [facility]  01/08/2021  9:00 AM [facility] PA/NP [facility] None  03/22/2021  8:35 AM [facility] DEVICE REMOTES [facility]  05/27/2021  8:45 AM Frann Rider, NP [facility] None  06/21/2021   8:35 AM [facility] DEVICE REMOTES [facility]  09/20/2021  8:35 AM [facility] DEVICE REMOTES [facility]  12/20/2021  8:35 AM [facility] DEVICE REMOTES [facility]    BP (!) 180/100   Pulse 68   Resp 18   Wt 237 lb (107.5 kg)   SpO2 99%   BMI 34.01 kg/m   Weight yesterday-? Last visit weight-234 @ clinic  Pt reports doing ok.  He denies increased sob, no c/p, no dizziness.  He got the new cardiomems device in the home so we paired that to him and adjusted it to get readings easier with tech support.   Refills needed--he needs isosorbide in large pill box in sat/sun/mon/tues am and all in the small pill box.  -needs hydralazine in small pill box.  Took meds during visit this morning. B/p elevated.   -farxiga  -  fenofibrate -sertraline -vascepa  I will get meds needed now and bring back tomor to place in pill box.   [name], [facility] Paramedic  12/08/20

## 2020-12-09 ENCOUNTER — Other Ambulatory Visit (HOSPITAL_COMMUNITY): Payer: Self-pay

## 2020-12-09 NOTE — Progress Notes (Signed)
Had to p/u his meds from pharmacy yesterday, came out today for med rec to place hydralazine and isosorbide in pill boxes.    [name], EMT-Paramedic  12/09/20

## 2020-12-11 ENCOUNTER — Ambulatory Visit: Payer: Medicare Other | Admitting: Nurse Practitioner

## 2020-12-11 ENCOUNTER — Ambulatory Visit: Payer: Medicare Other | Admitting: Family Medicine

## 2020-12-16 ENCOUNTER — Encounter: Payer: Self-pay | Admitting: Nurse Practitioner

## 2020-12-17 ENCOUNTER — Telehealth (HOSPITAL_COMMUNITY): Payer: Self-pay

## 2020-12-17 NOTE — Telephone Encounter (Signed)
Contacted pharmacy to order the following-  Farxiga Fenofibrate Sertraline [place], EMT-Paramedic  12/17/20

## 2020-12-21 ENCOUNTER — Ambulatory Visit (INDEPENDENT_AMBULATORY_CARE_PROVIDER_SITE_OTHER): Payer: Medicare Other

## 2020-12-21 DIAGNOSIS — I255 Ischemic cardiomyopathy: Secondary | ICD-10-CM | POA: Diagnosis not present

## 2020-12-21 LAB — CUP PACEART REMOTE DEVICE CHECK
Battery Remaining Longevity: 150 mo
Battery Remaining Percentage: 100 %
Brady Statistic RV Percent Paced: 0 %
Date Time Interrogation Session: 20220912010700
HighPow Impedance: 63 Ohm
Implantable Lead Implant Date: 20180706
Implantable Lead Location: 753860
Implantable Lead Model: 293
Implantable Lead Serial Number: 432676
Implantable Pulse Generator Implant Date: 20180706
Lead Channel Impedance Value: 418 Ohm
Lead Channel Setting Pacing Amplitude: 2.5 V
Lead Channel Setting Pacing Pulse Width: 0.4 ms
Lead Channel Setting Sensing Sensitivity: 0.5 mV
Pulse Gen Serial Number: 236570

## 2020-12-23 ENCOUNTER — Other Ambulatory Visit (HOSPITAL_COMMUNITY): Payer: Self-pay | Admitting: *Deleted

## 2020-12-23 ENCOUNTER — Other Ambulatory Visit (HOSPITAL_COMMUNITY): Payer: Self-pay

## 2020-12-23 ENCOUNTER — Encounter (HOSPITAL_COMMUNITY): Payer: Self-pay | Admitting: Adult Health

## 2020-12-23 DIAGNOSIS — I480 Paroxysmal atrial fibrillation: Secondary | ICD-10-CM

## 2020-12-23 NOTE — Progress Notes (Unsigned)
HF Paramedicine Team Based Care Meeting  HF MD- NA  HF NP - [place] NP-C   [facility] admit within the last 30 days for heart failure? no  Medications concerns? Continuing to miss medications sporadically  Transportation issues ? no  Education needs? Yes- continues to need education and reinforcement on his HF and diabetes management  SDOH - recent drop in his disability benefits  Concerns- working on getting CP, trying to get pt to increase compliance with cardiomems  Eligible for discharge? Not at this time

## 2020-12-23 NOTE — Progress Notes (Signed)
Paramedicine Encounter    Patient ID: Troy Rush, male    DOB: January 18, 1971, 50 y.o.   MRN: [id number]   Patient Care Team: Vevelyn Francois, NP as PCP - General (Adult Health Nurse Practitioner) Larey Dresser, MD as PCP - Cardiology (Cardiology) Conrad [place], NP as Nurse Practitioner (Cardiology) Larey Dresser, MD as Consulting Physician (Cardiology) Jorge Ny, LCSW as Social Worker (Licensed Clinical Social Worker)  Patient Active Problem List   Diagnosis Date Noted   Subcortical infarction [facility]) 10/03/2020   Cerebral thrombosis with cerebral infarction 10/01/2020   Unilateral weakness 09/30/2020   History of atrial fibrillation 09/30/2020   History of DVT (deep vein thrombosis) 09/30/2020   CKD (chronic kidney disease) stage 3, GFR 30-59 ml/min ([place]) 07/08/2020   Acute CHF (congestive heart failure) ([place]) 07/07/2020   CKD (chronic kidney disease), stage IV ([place]) 07/07/2020   ICD (implantable cardioverter-defibrillator) in place 05/14/2020   Pulmonary embolism ([place]) 02/05/2020   Acute kidney injury superimposed on CKD ([place])    Chronic combined systolic and diastolic heart failure ([place])    Atrial fibrillation with RVR ([place]) 02/01/2020   NSVT (nonsustained ventricular tachycardia) ([place]) 02/01/2020   Acute GI bleeding 02/01/2020   DKA (diabetic ketoacidosis) ([place]) 02/01/2020   Bilateral deafness 08/15/2019   Type 2 diabetes mellitus with complication, with long-term current use of insulin ([place]) 08/15/2019   Hemoglobin A1C between 7% and 9% indicating borderline diabetic control 08/15/2019   Insomnia 08/15/2019   Abdominal pain 11/14/2018   Ischemic cardiomyopathy 10/14/2016   NICM (nonischemic cardiomyopathy) ([place]) 123XX123   Chronic systolic CHF (congestive heart failure) ([place]) 05/30/2016   HTN (hypertension) 05/30/2016   Deaf 05/30/2016   Hyperkalemia 05/30/2016   Major depressive disorder, recurrent episode ([place]) 03/21/2016   Bronchitis 01/21/2015    Dyslipidemia 01/21/2015   Vitamin D deficiency 12/19/2011   Personal history of other infectious and parasitic diseases 10/25/2011   Anxiety disorder 05/12/2011   Hyperlipidemia 05/12/2011   Hypertriglyceridemia 05/12/2011   Obstructive sleep apnea 05/12/2011    Current Outpatient Medications:    acetaminophen (TYLENOL) 325 MG tablet, Take 2 tablets (650 mg total) by mouth every 4 (four) hours as needed for mild pain (or temp > 37.5 C (99.5 F))., Disp: , Rfl:    amiodarone (PACERONE) 200 MG tablet, Take 1 tablet (200 mg total) by mouth 2 (two) times daily., Disp: 60 tablet, Rfl: 6   apixaban (ELIQUIS) 5 MG TABS tablet, TAKE 1 TABLET(5 MG) BY MOUTH TWICE DAILY, Disp: 60 tablet, Rfl: 11   atorvastatin (LIPITOR) 80 MG tablet, Take 1 tablet (80 mg total) by mouth at bedtime., Disp: 30 tablet, Rfl: 11   carvedilol (COREG) 25 MG tablet, Take 1 tablet (25 mg total) by mouth 2 (two) times daily., Disp: 180 tablet, Rfl: 3   dapagliflozin propanediol (FARXIGA) 10 MG TABS tablet, Take 1 tablet (10 mg total) by mouth daily before breakfast., Disp: 90 tablet, Rfl: 3   fenofibrate 54 MG tablet, Take 1 tablet (54 mg total) by mouth daily., Disp: 90 tablet, Rfl: 0   furosemide (LASIX) 20 MG tablet, Take 2 tablets (40 mg total) by mouth daily., Disp: 60 tablet, Rfl: 0   hydrALAZINE (APRESOLINE) 100 MG tablet, Take 100 mg by mouth 3 (three) times daily., Disp: , Rfl:    insulin glargine (LANTUS SOLOSTAR) 100 UNIT/ML Solostar Pen, Inject 35 Units into the skin 2 (two) times daily., Disp: 15 mL, Rfl: 11   isosorbide mononitrate (IMDUR) 60 MG 24  hr tablet, Take 1 tablet (60 mg total) by mouth daily., Disp: 30 tablet, Rfl: 11   latanoprost (XALATAN) 0.005 % ophthalmic solution, Place 1 drop into both eyes at bedtime., Disp: , Rfl:    meclizine (ANTIVERT) 25 MG tablet, Take 1 tablet (25 mg total) by mouth 3 (three) times daily as needed for dizziness., Disp: 90 tablet, Rfl: 3   pantoprazole (PROTONIX) 40 MG tablet,  Take 1 tablet (40 mg total) by mouth 2 (two) times daily., Disp: 180 tablet, Rfl: 3   sertraline (ZOLOFT) 50 MG tablet, Take 1 tablet (50 mg total) by mouth daily., Disp: 30 tablet, Rfl: 0   VASCEPA 1 g capsule, Take 2 capsules (2 g total) by mouth 2 (two) times daily., Disp: 120 capsule, Rfl: 5 Allergies  Allergen Reactions   Lisinopril Cough      Social History   Socioeconomic History   Marital status: Legally Separated    Spouse name: Not on file   Number of children: Not on file   Years of education: Not on file   Highest education level: Not on file  Occupational History   Not on file  Tobacco Use   Smoking status: Never   Smokeless tobacco: Never  Vaping Use   Vaping Use: Never used  Substance and Sexual Activity   Alcohol use: Yes    Alcohol/week: 1.0 - 2.0 standard drink    Types: 1 - 2 Glasses of wine per week    Comment: rare   Drug use: No   Sexual activity: Yes  Other Topics Concern   Not on file  Social History Narrative   Not on file   Social Determinants of Health   Financial Resource Strain: Medium Risk   Difficulty of Paying Living Expenses: Somewhat hard  Food Insecurity: No Food Insecurity   Worried About Charity fundraiser in the Last Year: Never true   Ran Out of Food in the Last Year: Never true  Transportation Needs: No Transportation Needs   Lack of Transportation (Medical): No   Lack of Transportation (Non-Medical): No  Physical Activity: Not on file  Stress: Not on file  Social Connections: Not on file  Intimate Partner Violence: Not on file    Physical Exam      Future Appointments  Date Time Provider [place]  01/08/2021  9:00 AM [facility] PA/NP [facility] None  01/22/2021  1:00 PM Vevelyn Francois, NP [facility] None  03/22/2021  8:35 AM [facility] DEVICE REMOTES [facility]  05/27/2021  8:45 AM Frann Rider, NP [facility] None  06/21/2021  8:35 AM [facility] DEVICE REMOTES [facility]  09/20/2021   8:35 AM [facility] DEVICE REMOTES [facility]  12/20/2021  8:35 AM [facility] DEVICE REMOTES [facility]    BP 132/70   Pulse 68   Resp 18   Wt 236 lb (107 kg)   SpO2 97%   BMI 33.86 kg/m  CBG EMS-369 Weight yesterday-? Last visit weight-237 B/p standing-124/62  Pt reports feeling ok, just slightly dizzy today.  B/p is WNL, his CBG is elevated-he does report possibly eating too many carbs/sweets. Advised him to cut back. He did take insulin this morning. No orthostatics.  He denies c/p, no increased sob, he is using the cardiomems,  He did miss a few days of meds over the past 2 wks.  Meds verified and 2 wks worth of pill boxes filled.  He is out of the sertraline and amio in the 2nd pill box.  Will  be out next week for that.   Refills needed- Amio Meclizine Sertraline vascepa   Contacted pharmacy to order those meds.   Got his old box to return the cardiomems device. Will drop off at [facility] location.    [name], [facility] Paramedic  12/23/20

## 2020-12-28 ENCOUNTER — Telehealth: Payer: Self-pay

## 2020-12-28 NOTE — Progress Notes (Signed)
Patient ID: Troy Rush                 DOB: [birth year]                    MRN: [id number]     HPI: Troy Rush is a 50 y.o. male patient of Dr. Aundra Dubin referred to lipid clinic by Allena Katz, [place]. PMH is significant for chronic systolic CHF s/p ICD, NICM, T2DM, HTN, CKD, PE/DVT 07/2019 and 01/2020 on Eliquis, HLD, severe OSA, CVA 09/2020, deafness. Followed by HF paramedicine. History of noncompliance and stopping medications due to depression and EtOH abuse.   Today, patient arrives in good spirits accompanied by a sign language interpreter. Reports that he is taking all of his medications without missed doses. The HF paramedicine team comes to his house and fills his pill boxes for him which he reports is helpful. Denies any adverse effects with atorvastatin, fenofibrate, and Vascepa.   Current Medications: Atorvastatin 80 mg daily, fenofibrate 54 mg daily, Vascepa 2 grams BID Intolerances: N/A Risk Factors: Hx CVA, HTN, CKD, T2DM LDL goal: <55 mg/dL  Diet: Eats 1-3 meals/day depending on the day. Works in [employer]. Tries to eat healthy like salads, soup, deli sandwiches, vegetables. No french fries, hamburgers, sodas. Some sports drinks but it has no sugar. Not a lot of bread. Dessert once in a while. Only drinks alcohol on special occassions, very rarely.   Exercise: On feet all day at work. No formal exercise.   Family History: No known cardiac disease.   Social History:  From [place] originally.  Lives in [place] until 2017, then moved to [place].  Nonsmoker.  Unemployed.  H/o ETOH abuse.    Labs: 11/18/20: TC 326, TG 384, HDL 42, LDL 205 (atorvastatin '80mg'$ , fenofibrate '54mg'$ , Vascepa 2g BID) 12/09/19: TC 480, TG 2601, HDL 32 (atorvastatin '80mg'$ , fenofibrate '54mg'$ , Vascepa 2g BID)  Past Medical History:  Diagnosis Date   AICD (automatic cardioverter/defibrillator) present 10/14/2016   Bilateral deafness    Bronchitis    CHF (congestive heart failure) ([place])    Depression     Diabetes mellitus without complication ([place])    DVT, lower extremity ([place]) 07/12/2019   Dyslipidemia    Headache 08/2019   Hypertension    ICD (implantable cardioverter-defibrillator) in place    Insomnia 08/2019   NICM (nonischemic cardiomyopathy) ([place]) 09/15/2016   Pancreatitis    Sleep apnea    Vitamin D deficiency 08/2019    Current Outpatient Medications on File Prior to Visit  Medication Sig Dispense Refill   acetaminophen (TYLENOL) 325 MG tablet Take 2 tablets (650 mg total) by mouth every 4 (four) hours as needed for mild pain (or temp > 37.5 C (99.5 F)).     amiodarone (PACERONE) 200 MG tablet Take 1 tablet (200 mg total) by mouth 2 (two) times daily. 60 tablet 6   apixaban (ELIQUIS) 5 MG TABS tablet TAKE 1 TABLET(5 MG) BY MOUTH TWICE DAILY 60 tablet 11   atorvastatin (LIPITOR) 80 MG tablet Take 1 tablet (80 mg total) by mouth at bedtime. 30 tablet 11   carvedilol (COREG) 25 MG tablet Take 1 tablet (25 mg total) by mouth 2 (two) times daily. 180 tablet 3   dapagliflozin propanediol (FARXIGA) 10 MG TABS tablet Take 1 tablet (10 mg total) by mouth daily before breakfast. 90 tablet 3   fenofibrate 54 MG tablet Take 1 tablet (54 mg total) by mouth daily. 90 tablet 0  furosemide (LASIX) 20 MG tablet Take 2 tablets (40 mg total) by mouth daily. 60 tablet 0   hydrALAZINE (APRESOLINE) 100 MG tablet Take 100 mg by mouth 3 (three) times daily.     insulin glargine (LANTUS SOLOSTAR) 100 UNIT/ML Solostar Pen Inject 35 Units into the skin 2 (two) times daily. 15 mL 11   isosorbide mononitrate (IMDUR) 60 MG 24 hr tablet Take 1 tablet (60 mg total) by mouth daily. 30 tablet 11   latanoprost (XALATAN) 0.005 % ophthalmic solution Place 1 drop into both eyes at bedtime.     meclizine (ANTIVERT) 25 MG tablet Take 1 tablet (25 mg total) by mouth 3 (three) times daily as needed for dizziness. 90 tablet 3   pantoprazole (PROTONIX) 40 MG tablet Take 1 tablet (40 mg total) by mouth 2 (two) times daily.  180 tablet 3   sertraline (ZOLOFT) 50 MG tablet Take 1 tablet (50 mg total) by mouth daily. 30 tablet 0   VASCEPA 1 g capsule Take 2 capsules (2 g total) by mouth 2 (two) times daily. 120 capsule 5   No current facility-administered medications on file prior to visit.    Allergies  Allergen Reactions   Lisinopril Cough    Assessment/Plan:  1. Hyperlipidemia - LDL of 205 not at goal <55 mg/dL. Triglycerides not at goal <150 mg/dL but much improved from last year. Suspect this is largely due to him being adherent to both his diabetes and hyperlipidemia medications and significantly decreasing alcohol intake. Given that he is currently adherent to atorvastatin, fenofibrate, and Vascepa now and at the time of his lipid panel, and LDL is still 205, will plan to start PCSK9i, Repatha, which should lower LDL by 60% and provide ASCVD benefit. Discussed that this will be added on to his current medications, and that adherence to all his medications is important. He is willing to start Ola and is comfortable with injections since he takes insulin. We reviewed appropriate storage and administration technique, and he confirms understanding. He confirms that it is best to contact him through [place]. PA for Repatha has been approved until 06/27/21. Sent this to his pharmacy - confirmed cost is $1.35 and they will need to order it to come in tomorrow. I have messaged him through MyChart with this information. Counseled him in the office to start taking it once he picks it up from the pharmacy. Encouraged him to keep up the good work with his diet and avoiding alcohol, and to try to increase his exercise as he is able. Reviewed foods and drinks that are more likely to raise triglycerides and he expressed understanding to try to avoid these and start exercising to help improve his triglycerides. Provided patient with written instructions which he reviewed and has no questions about. Scheduled him for follow up  fasting labs on November 15th.    Rebbeca Paul, PharmD PGY2 Ambulatory Care Pharmacy Resident 12/29/2020 11:51 AM

## 2020-12-28 NOTE — Telephone Encounter (Signed)
Patient called, sign language interpreter services answered, left VM to call the office to schedule medicare wellness visit with a nurse.

## 2020-12-29 ENCOUNTER — Encounter: Payer: Self-pay | Admitting: Student-PharmD

## 2020-12-29 ENCOUNTER — Ambulatory Visit (INDEPENDENT_AMBULATORY_CARE_PROVIDER_SITE_OTHER): Payer: Medicare Other | Admitting: Student-PharmD

## 2020-12-29 ENCOUNTER — Other Ambulatory Visit: Payer: Self-pay

## 2020-12-29 DIAGNOSIS — E785 Hyperlipidemia, unspecified: Secondary | ICD-10-CM

## 2020-12-29 MED ORDER — REPATHA SURECLICK 140 MG/ML ~~LOC~~ SOAJ: 140.0000 mg | SUBCUTANEOUS | 11 refills | Status: AC

## 2020-12-29 NOTE — Progress Notes (Signed)
Remote ICD transmission.   

## 2020-12-29 NOTE — Patient Instructions (Addendum)
Nice to see you today!  Keep up the good work with diet and exercise. Aim for a diet full of vegetables, fruit and lean meats (chicken, [place], fish). Try to limit carbs (bread, pasta, sugar, rice) and red meat consumption.  Your goal LDL is less than 55 mg/dL, you're currently at 205 mg/dL Your goal triglycerides is less than 150 mg/dL, you're currently at 384 but this has come down since decreasing foods high in sugar/carbs, decreasing alcohol, and taking your diabetes medications.   Medication Changes:  Continue atorvastatin 80 mg daily, fenofibrate 54 mg daily, Vascepa 2 g twice daily  The name of the medication we discussed today is called Repatha. It is injected into your abdomen every 14 days and stored in the refrigerator. I am submitting a prior authorization to your insurance for this medication and will message you through MyChart when it has been approved and sent to your pharmacy.   We will check lab work on November 15 here at this office on [address]. You can come anytime after 7:30am. Make sure you have not eaten anything yet that day as you need to be fasting.  Please give [place] a call at [phone] or message through [place] with any questions or concerns.   Instructions for Repatha injection:  For Repatha, inject once every other week (any day of the week that works for you) into the fatty skin of stomach, upper outer thigh or back of the arm. Clean the site with soap and warm water or an alcohol pad. Keep the medication in the fridge until you are ready to give your dose, then take it out and let warm up to room temperature for 30-60 mins.

## 2020-12-31 ENCOUNTER — Other Ambulatory Visit (HOSPITAL_COMMUNITY): Payer: Self-pay | Admitting: Nurse Practitioner

## 2020-12-31 ENCOUNTER — Other Ambulatory Visit (HOSPITAL_COMMUNITY): Payer: Self-pay

## 2020-12-31 NOTE — Progress Notes (Signed)
Came out for med rec post pharmacy delivery.  Pharmacy did not send out the sertraline.  Will call them about that.  Also looks like they have prescribed him repatha, will check with pharmacy about that one as well.   Needs sertraline, carvedilol, hydralazine.   The sertraline may need a rx from doc. I think they pharm was trying to send it to the hospitalist.   -the repatha is ready, they will send refill req to his PCP, D[place], and then the carvedilol and hydralazine will be filled next week.   I will see him in 2 wks.   [name], EMT-Paramedic  12/31/20

## 2021-01-05 ENCOUNTER — Other Ambulatory Visit (HOSPITAL_COMMUNITY): Payer: Self-pay

## 2021-01-05 NOTE — Progress Notes (Signed)
P/u pts repatha from pharmacy and took it to him.    [name], EMT-Paramedic  01/05/21

## 2021-01-07 ENCOUNTER — Telehealth (HOSPITAL_COMMUNITY): Payer: Self-pay

## 2021-01-07 NOTE — Telephone Encounter (Signed)
Called to confirm/remind patient of their appointment at the [facility] on 01/08/21.   The Interpreter related the message in which the patient was also on the line.  Patient reminded to bring all medications and/or complete list.  Confirmed patient has transportation. Gave directions, instructed to utilize [place] parking.  Confirmed appointment prior to ending call.

## 2021-01-08 ENCOUNTER — Encounter (HOSPITAL_COMMUNITY): Payer: Self-pay

## 2021-01-08 ENCOUNTER — Ambulatory Visit (HOSPITAL_COMMUNITY)
Admission: RE | Admit: 2021-01-08 | Discharge: 2021-01-08 | Disposition: A | Discharge status: Home / Self Care | Payer: Medicare Other | Source: Ambulatory Visit | Attending: Family Medicine | Admitting: Family Medicine

## 2021-01-08 ENCOUNTER — Other Ambulatory Visit: Payer: Self-pay

## 2021-01-08 VITALS — BP 142/80 | HR 74 | Wt 242.0 lb

## 2021-01-08 DIAGNOSIS — R0602 Shortness of breath: Secondary | ICD-10-CM | POA: Insufficient documentation

## 2021-01-08 DIAGNOSIS — H9193 Unspecified hearing loss, bilateral: Secondary | ICD-10-CM | POA: Diagnosis not present

## 2021-01-08 DIAGNOSIS — F1011 Alcohol abuse, in remission: Secondary | ICD-10-CM

## 2021-01-08 DIAGNOSIS — Z86718 Personal history of other venous thrombosis and embolism: Secondary | ICD-10-CM | POA: Diagnosis not present

## 2021-01-08 DIAGNOSIS — E118 Type 2 diabetes mellitus with unspecified complications: Secondary | ICD-10-CM

## 2021-01-08 DIAGNOSIS — E785 Hyperlipidemia, unspecified: Secondary | ICD-10-CM | POA: Insufficient documentation

## 2021-01-08 DIAGNOSIS — N1832 Chronic kidney disease, stage 3b: Secondary | ICD-10-CM | POA: Diagnosis not present

## 2021-01-08 DIAGNOSIS — H9190 Unspecified hearing loss, unspecified ear: Secondary | ICD-10-CM | POA: Insufficient documentation

## 2021-01-08 DIAGNOSIS — Z794 Long term (current) use of insulin: Secondary | ICD-10-CM | POA: Insufficient documentation

## 2021-01-08 DIAGNOSIS — I4891 Unspecified atrial fibrillation: Secondary | ICD-10-CM

## 2021-01-08 DIAGNOSIS — I272 Pulmonary hypertension, unspecified: Secondary | ICD-10-CM | POA: Insufficient documentation

## 2021-01-08 DIAGNOSIS — Z79899 Other long term (current) drug therapy: Secondary | ICD-10-CM | POA: Insufficient documentation

## 2021-01-08 DIAGNOSIS — I428 Other cardiomyopathies: Secondary | ICD-10-CM | POA: Diagnosis not present

## 2021-01-08 DIAGNOSIS — Z9119 Patient's noncompliance with other medical treatment and regimen: Secondary | ICD-10-CM | POA: Insufficient documentation

## 2021-01-08 DIAGNOSIS — E7800 Pure hypercholesterolemia, unspecified: Secondary | ICD-10-CM | POA: Diagnosis not present

## 2021-01-08 DIAGNOSIS — I5042 Chronic combined systolic (congestive) and diastolic (congestive) heart failure: Secondary | ICD-10-CM

## 2021-01-08 DIAGNOSIS — I480 Paroxysmal atrial fibrillation: Secondary | ICD-10-CM | POA: Insufficient documentation

## 2021-01-08 DIAGNOSIS — Z86711 Personal history of pulmonary embolism: Secondary | ICD-10-CM

## 2021-01-08 DIAGNOSIS — N183 Chronic kidney disease, stage 3 unspecified: Secondary | ICD-10-CM | POA: Insufficient documentation

## 2021-01-08 DIAGNOSIS — Z8673 Personal history of transient ischemic attack (TIA), and cerebral infarction without residual deficits: Secondary | ICD-10-CM | POA: Diagnosis not present

## 2021-01-08 DIAGNOSIS — Z7901 Long term (current) use of anticoagulants: Secondary | ICD-10-CM | POA: Insufficient documentation

## 2021-01-08 DIAGNOSIS — Z8616 Personal history of COVID-19: Secondary | ICD-10-CM | POA: Insufficient documentation

## 2021-01-08 DIAGNOSIS — E1122 Type 2 diabetes mellitus with diabetic chronic kidney disease: Secondary | ICD-10-CM | POA: Diagnosis not present

## 2021-01-08 DIAGNOSIS — Z95810 Presence of automatic (implantable) cardiac defibrillator: Secondary | ICD-10-CM | POA: Insufficient documentation

## 2021-01-08 DIAGNOSIS — Z09 Encounter for follow-up examination after completed treatment for conditions other than malignant neoplasm: Secondary | ICD-10-CM | POA: Diagnosis not present

## 2021-01-08 DIAGNOSIS — I10 Essential (primary) hypertension: Secondary | ICD-10-CM | POA: Diagnosis not present

## 2021-01-08 DIAGNOSIS — R42 Dizziness and giddiness: Secondary | ICD-10-CM | POA: Insufficient documentation

## 2021-01-08 DIAGNOSIS — Z560 Unemployment, unspecified: Secondary | ICD-10-CM | POA: Diagnosis not present

## 2021-01-08 DIAGNOSIS — G4733 Obstructive sleep apnea (adult) (pediatric): Secondary | ICD-10-CM

## 2021-01-08 DIAGNOSIS — I130 Hypertensive heart and chronic kidney disease with heart failure and stage 1 through stage 4 chronic kidney disease, or unspecified chronic kidney disease: Secondary | ICD-10-CM | POA: Diagnosis not present

## 2021-01-08 DIAGNOSIS — Z91199 Patient's noncompliance with other medical treatment and regimen due to unspecified reason: Secondary | ICD-10-CM

## 2021-01-08 LAB — BASIC METABOLIC PANEL
Anion gap: 9 (ref 5–15)
BUN: 34 mg/dL — ABNORMAL HIGH (ref 6–20)
CO2: 24 mmol/L (ref 22–32)
Calcium: 9.4 mg/dL (ref 8.9–10.3)
Chloride: 107 mmol/L (ref 98–111)
Creatinine, Ser: 2.16 mg/dL — ABNORMAL HIGH (ref 0.61–1.24)
GFR, Estimated: 36 mL/min — ABNORMAL LOW (ref >60)
Glucose, Bld: 153 mg/dL — ABNORMAL HIGH (ref 70–99)
Potassium: 4.4 mmol/L (ref 3.5–5.1)
Sodium: 140 mmol/L (ref 135–145)

## 2021-01-08 NOTE — Progress Notes (Signed)
Advanced Heart Failure Clinic Note   PCP: Kathe Becton, FNP HF Cardiology: Dr. Aundra Dubin  Reason for Visit: f/u for chronic systolic heart failure   Troy Rush is a 50 year old with a history of with deafness, HTN, DM, and chronic systolic HF.   Patient had no known cardiac problems until 10/16.  At that time, he developed exertional dyspnea and was admitted to a hospital in [place], [place] with acute systolic CHF.  Echo 10/16 showed EF 15-20%.  Cardiac cath showed no significant coronary disease.  He was diuresed and started on cardiac meds. Subsequently, he moved to [facility].  Now working in a [employer].  Repeat echo in 5/18 showed persistently low EF, 25-30%.  He had [place] placed.   He was admitted briefly with CHF exacerbation in 8/20.    Echo in 9/20 showed EF 30-35% with diffuse hypokinesis.   Ultrasound in 4/21 showed right leg DVT and CT showed a PE.  No obvious trigger (no long trip, no surgery, no prolonged immobility).  He was started on apixaban.   He quit taking his meds in 8/21 and was drinking heavily due to depression, later restarted meds.  In 10/21, he was admitted with DKA and atrial fibrillation/RVR (1st known episode).  He converted to NSR spontaneously.  [name] was stopped due to AKI.  He had melena/hematochezia and EGD showed gastritis/esophagitis.  Apixaban was stopped.  He went home but then was re-admitted later in 10/21 with bilateral PEs.   Echo in 10/21 showed EF 45%, mild global hypokinesis, moderate LVH, mildly decreased RV systolic function.    On 05/05/20, he had a near-syncopal event and went to the ER, no events on ICD interrogation.  ?Orthostatic.  No further dizziness/lightheadedness. He is working at [employer] in [employer].  Able to do his job without dyspnea, but he gets short of breath after walking about 50 feet.  Sleepy during the day. Sleep study ordered.  Repeat RHC (2/22) showed optimized filling pressures, mild pulmonary  hypertension, preserved cardiac output. Suspect elevated PA pressure may be related to OSA.    Admitted [id number]  With A/C systolic heart failure. Diuresed with IV lasix and transitioned to lasix 60 mg daily. Discharge weight 232 pounds.   09/10/20 he presented for scheduled cardiomems and was noted to be in A fib RVR. RHC with succesful cardiomems implant. Stable hemodynamics on cath. Admitted from cath lab with A fib RVR and started on amio drip. Chemically converted to NSR was transitioned to amio 200 mg twice a day. Also incidentally found to be COVID +.   09/30/20, he was admitted w/ acute subcortical CVA. Unable to get cMRI given ICD. Neurology felt related to small vessel disease. Eliquis continued. He was d/c to CIR for rehab.   Today he returns for HF follow up with interpretor. He does not feel good today. He is sleeping poorly, still does not have his CPAP. He is more SOB today and feels his weight is up.  Still has intermittent dizziness, but no worse. Denies CP, edema, or PND/Orthopnea. Appetite ok. No fever or chills. Taking all medications. He continues to work in [employer] at [employer] and eats food there frequently, ate [employer] yesterday. Has not used Cardiomems in several days but says it is working now.  Cardiomems from 01/04/21 PAd 29 (personally reviewed).  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  Labs (9/17): BNP 477, K  4.5 => 4.2, creatinine 0.92 => 1.1, BNP 1009, HIV negative, SPEP negative.  Labs (10/17): K 5.2, creatinine 1.10, hgb 12.5 Labs (03/28/2016): K 5.3 Creatinine 1.07  Labs (3/18): digoxin < 0.2, K 4.1, creatinine 0.97 Labs (9/18): hgb 12.2, K 4, creatinine 0.99, BNP 139 Labs (1/19): hgb 11.2, K 4.8, creatinine 1.26 Labs (8/20): K 3.7, creatinine 1.69 Labs (5/21): K 5, creatinine 1.4, LDL 192 Labs (10/21): K 4.1, creatinine 1.77, LDL 180, TGs 138 Labs (11/21): K 4.5,  creatinine 2.68 Labs (12/21): K 5.1, creatinine 2.25 Labs (04/30/20): K 4.7, creatinine 2.7 Labs (05/05/20): K 4.0, creatinine 2.15 Labs (05/11/20): K 4.2, creatinine 2.43, LDL 76, HDL 38, TGs 220 Labs (2/22): K 4.4, creatinine 2.69 Labs (2/22): SCr 2.56, GFR 29, K 3.9  Labs (8/22): K 5.0, creatinine 2.84, LDL 205, HDL 42, TGs 384  PMH: 1. HTN 2. Diabetes 3. Hyperlipidemia 4. Deafness since age 89: Needs sign language interpreter.  5. Chronic systolic CHF: Nonischemic cardiomyopathy.  Diagnosis 10/16 in [place], [place] (admitted with acute systolic CHF).  SPEP and HIV negative.  [place].  - Echo (10/16): EF 15-20%, mild LVH, mild Troy, normal RV size and systolic function.  - LHC (10/16): No significant CAD.  - CPX (12/17): Mild functional limitation.  - 04/2016 CMRI: EF 27%. RV normal. No evidence of infiltrative disease, myocarditis, or MI.  - Echo (5/18): EF 25-30%, mild LV dilation, mild LVH, normal RV size with mildly decreased - Echo (9/20): EF 30-35%, mild LVH, diffuse hypokinesis, normal RV size and systolic function.  - Echo (10/21): EF 45%, mild global hypokinesis, moderate LVH, mildly decreased RV systolic function.  - RHC (2/22): Optimized filling pressures, mild pulmonary hypertension, preserved cardiac output. Continue current diuretics.  Suspect elevated PA pressure may be related to OSA.   6. Depression. 7. Colitis episodes.  8. CKD stage 3.  9. DVT/PE in 4/21: No obvious trigger.  - Recurrent PE in 10/21, had been off anticoagulation.  10. Atrial fibrillation: Paroxysmal.  11. Gastritis/esophagitis: 10/21.  12. ETOH abuse  SH: From [place] originally.  Lives in [place] until 2017, then moved to [place].  Nonsmoker.  Unemployed.  H/o ETOH abuse.    FH: No known cardiac disease.   ROS: All systems reviewed and negative except as per HPI.   Current Outpatient Medications  Medication Sig Dispense Refill   acetaminophen (TYLENOL) 325 MG tablet Take 2 tablets (650  mg total) by mouth every 4 (four) hours as needed for mild pain (or temp > 37.5 C (99.5 F)).     amiodarone (PACERONE) 200 MG tablet Take 1 tablet (200 mg total) by mouth 2 (two) times daily. 60 tablet 6   apixaban (ELIQUIS) 5 MG TABS tablet TAKE 1 TABLET(5 MG) BY MOUTH TWICE DAILY 60 tablet 11   atorvastatin (LIPITOR) 80 MG tablet Take 1 tablet (80 mg total) by mouth at bedtime. 30 tablet 11   carvedilol (COREG) 25 MG tablet TAKE 1 TABLET (25 MG TOTAL) BY MOUTH 2 (TWO) TIMES DAILY. 60 tablet 0   dapagliflozin propanediol (FARXIGA) 10 MG TABS tablet Take 1 tablet (10 mg total) by mouth daily before breakfast. 90 tablet 3   Evolocumab (REPATHA SURECLICK) XX123456 MG/ML SOAJ Inject 140 mg into the skin every 14 (fourteen) days. 2 mL 11   furosemide (LASIX) 20 MG tablet Take 2 tablets (40 mg total) by mouth daily. 60 tablet 0   hydrALAZINE (APRESOLINE) 100 MG tablet Take 100 mg by mouth 3 (three) times daily.  insulin glargine (LANTUS SOLOSTAR) 100 UNIT/ML Solostar Pen Inject 35 Units into the skin 2 (two) times daily. 15 mL 11   isosorbide mononitrate (IMDUR) 60 MG 24 hr tablet Take 1 tablet (60 mg total) by mouth daily. 30 tablet 11   latanoprost (XALATAN) 0.005 % ophthalmic solution Place 1 drop into both eyes at bedtime.     meclizine (ANTIVERT) 25 MG tablet Take 1 tablet (25 mg total) by mouth 3 (three) times daily as needed for dizziness. 90 tablet 3   pantoprazole (PROTONIX) 40 MG tablet Take 1 tablet (40 mg total) by mouth 2 (two) times daily. 180 tablet 3   sertraline (ZOLOFT) 50 MG tablet Take 1 tablet (50 mg total) by mouth daily. 30 tablet 0   VASCEPA 1 g capsule Take 2 capsules (2 g total) by mouth 2 (two) times daily. 120 capsule 5   No current facility-administered medications for this encounter.   BP (!) 142/80   Pulse 74   Wt 109.8 kg (242 lb)   SpO2 95%   BMI 34.72 kg/m    Wt Readings from Last 3 Encounters:  01/08/21 109.8 kg (242 lb)  12/23/20 107 kg (236 lb)  12/08/20  107.5 kg (237 lb)    PHYSICAL EXAM: General:  NAD. No resp difficulty, appears tired HEENT: Deaf Neck: Supple. JVP 7-8. Carotids 2+ bilat; no bruits. No lymphadenopathy or thryomegaly appreciated. Cor: PMI nondisplaced. Regular rate & rhythm. No rubs, gallops or murmurs. Lungs: Clear Abdomen: Obese, nontender, +distended. No hepatosplenomegaly. No bruits or masses. Good bowel sounds. Extremities: No cyanosis, clubbing, rash, trace LE edema Neuro: Alert & oriented x 3, cranial nerves grossly intact. Moves all 4 extremities w/o difficulty. Affect pleasant.  Assessment/Plan: 1. Chronic systolic CHF: Nonischemic cardiomyopathy, etiology uncertain. Consider prior ETOH or HTN.  He has a [place].  cMRI in 1/18 with EF 27%, RV normal, no evidence of infiltrative disease, myocarditis, or MI. Echo in 9/20 showed EF 30-35%.  Echo in 10/21 showed EF up to 45% with mild RV dysfunction.  Repeat RHC (2/22) with optimized filling pressures, preserved CO and moderate pulmonary hypertension-->likely due to untreated sleep apnea. Still awaiting CPAP device. Cardiomems placed 6/22. RHC at time of placement: RA 8, PCWP 9, CI 1.93 (Fick), 2.7 (Thermo). NYHA II-early III, he is volume overloaded on exam & by device interrogation, likely in the setting of dietary indiscretion. He has been compliant with his medications. - He has been enrolled in monthly fluid monitoring with the device clinic. - Increase lasix to 40 mg bid x 3 days then back to 40 daily. BMET today. He may ultimately require switching to torsemide. BMET today, repeat in 10 days. Discussed limiting fast food. - Continue Farxiga 10 mg daily. - Continue Coreg 25 mg bid.    - Continue hydralazine 100 mg tid + Imdur 60 mg daily. - Off Entresto and spironolactone with elevated creatinine.  - Advised to improve compliance w/ cardiomems.  2. Type II diabetes: management per PCP. Continue Lantus. He does not check his blood sugars at home. Most  recent Hgb A1c 9.7.  - On Farxiga. 3. HTN: Controlled. - Continue meds as above. 4. Deafness: Sign language interpreter present at visit.  5. CKD: Stage 3.  - BMET today. - Continue [place] as long as GFR > 25 - He is now followed by Nephrology. 6. PE/DVT: 4/21.  Unclear cause, no prolonged trip, prolonged immobility, or history of malignancy. No FH clotting. Recurrent PE in 10/21 while  Eliquis on hold with bleeding from esophagitis/gastritis.  Do no think this was an Eliquis failure.  - Continue apixaban, think he will need long-term with no clear trigger for VTE + AFib. No bleeding issues.  7. Hyperlipidemia: On atorvastatin, fenofibrate and Vascepa. Lipids 11/18/20 LDL not at goal of <70. He has been referred to Lipid Clinic ? [facility]. 8. Atrial fibrillation: First noted in setting of DKA in 10/21.  Had been on amiodarone but discontinued 5/22. Had recurrent Afib 6/22. - Given recurrence, he has been referred to EP for ablation consideration. Appt this month. - Regular on exam today.  - Continue Eliquis. No bleeding. 9. ETOH abuse: ETOH may have played a role in both cardiomyopathy and atrial fibrillation.  No longer drinking alcohol, lives with roommate who regularly drinks ETOH.  10. Severe OSA: recent sleep study (AHI = 125.7/h). Followed by Dr. Radford Pax. Awaiting CPAP device, they are backed up.  - Will follow up on this today as he needs his CPAP. 11. Noncompliance:  Previously doing well with medication adherence but missed his medications last week. - Continue HF Paramedicine.  12. CVA: 6/22, subcortical CVA. Unable to get cMRI given ICD. Neurology felt related to small vessel disease. No residual deficits - Continue Eliquis.   Follow up in 3 weeks with APP to reassess volume status, may need to switch lasix to torsemide.   [place], FNP-BC 01/08/2021

## 2021-01-08 NOTE — Patient Instructions (Addendum)
Labs done today. We will contact you only if your labs are abnormal.  INCREASE Lasix to '40mg'$  by mouth 2 times daily for 3 days then back down to '40mg'$  (2 tablets) by mouth daily.   No other medication changes were made. Please continue all current medications as prescribed.  Your physician recommends that you schedule a follow-up appointment in: 10 days for a lab only appointment,3 weeks with our APP Clinic and in 3 months with Troy. Aundra Dubin.  If you have any questions or concerns before your next appointment please send [place] a message through [place] or call our office at [phone].    TO LEAVE A MESSAGE FOR THE NURSE SELECT OPTION 2, PLEASE LEAVE A MESSAGE INCLUDING: YOUR NAME DATE OF BIRTH CALL BACK NUMBER REASON FOR CALL**this is important as we prioritize the call backs  YOU WILL RECEIVE A CALL BACK THE SAME DAY AS LONG AS YOU CALL BEFORE 4:00 PM   Do the following things EVERYDAY: Weigh yourself in the morning before breakfast. Write it down and keep it in a log. Take your medicines as prescribed Eat low salt foods--Limit salt (sodium) to 2000 mg per day.  Stay as active as you can everyday Limit all fluids for the day to less than 2 liters   At the [facility], you and your health needs are our priority. As part of our continuing mission to provide you with exceptional heart care, we have created designated Provider Care Teams. These Care Teams include your primary Cardiologist (physician) and Advanced Practice Providers (APPs- Physician Assistants and Nurse Practitioners) who all work together to provide you with the care you need, when you need it.   You may see any of the following providers on your designated Care Team at your next follow up: Troy Rush Troy Haynes Rush, Troy Rush, Troy Rush, Troy Rush   Please be sure to bring in all your medications bottles to every appointment.

## 2021-01-14 ENCOUNTER — Other Ambulatory Visit: Payer: Self-pay | Admitting: Nurse Practitioner

## 2021-01-14 ENCOUNTER — Other Ambulatory Visit (HOSPITAL_COMMUNITY): Payer: Self-pay

## 2021-01-14 ENCOUNTER — Inpatient Hospital Stay: Payer: Medicare Other | Admitting: Physical Medicine and Rehabilitation

## 2021-01-14 ENCOUNTER — Telehealth (HOSPITAL_COMMUNITY): Payer: Self-pay | Admitting: *Deleted

## 2021-01-14 ENCOUNTER — Inpatient Hospital Stay: Payer: Medicare Other | Admitting: Physical Medicine & Rehabilitation
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
## 2021-01-14 NOTE — Telephone Encounter (Signed)
Troy Rush with paramedicine called to report pts weight is up almost 10lbs x 1 week. Pt is short of  breath with BLEE.  Per Amy give '80mg'$  of IV lasix today and check pt tomorrow. [name] is aware.

## 2021-01-15 ENCOUNTER — Ambulatory Visit (INDEPENDENT_AMBULATORY_CARE_PROVIDER_SITE_OTHER): Payer: Medicare Other | Admitting: Nurse Practitioner

## 2021-01-15 ENCOUNTER — Other Ambulatory Visit: Payer: Self-pay

## 2021-01-15 ENCOUNTER — Encounter: Payer: Self-pay | Admitting: Nurse Practitioner

## 2021-01-15 VITALS — BP 114/57 | HR 63 | Temp 98.1°F | Ht 70.0 in | Wt 238.8 lb

## 2021-01-15 DIAGNOSIS — Z789 Other specified health status: Secondary | ICD-10-CM | POA: Diagnosis not present

## 2021-01-15 DIAGNOSIS — I5022 Chronic systolic (congestive) heart failure: Secondary | ICD-10-CM

## 2021-01-15 DIAGNOSIS — Z794 Long term (current) use of insulin: Secondary | ICD-10-CM | POA: Diagnosis not present

## 2021-01-15 DIAGNOSIS — I10 Essential (primary) hypertension: Secondary | ICD-10-CM

## 2021-01-15 DIAGNOSIS — N183 Chronic kidney disease, stage 3 unspecified: Secondary | ICD-10-CM

## 2021-01-15 DIAGNOSIS — Z23 Encounter for immunization: Secondary | ICD-10-CM

## 2021-01-15 DIAGNOSIS — I6330 Cerebral infarction due to thrombosis of unspecified cerebral artery: Secondary | ICD-10-CM

## 2021-01-15 DIAGNOSIS — E118 Type 2 diabetes mellitus with unspecified complications: Secondary | ICD-10-CM

## 2021-01-15 LAB — POCT GLYCOSYLATED HEMOGLOBIN (HGB A1C)
HbA1c POC (<> result, manual entry): 7.8 % (ref 4.0–5.6)
HbA1c, POC (controlled diabetic range): 7.8 % — AB (ref 0.0–7.0)
HbA1c, POC (prediabetic range): 7.8 % — AB (ref 5.7–6.4)
Hemoglobin A1C: 7.8 % — AB (ref 4.0–5.6)

## 2021-01-15 LAB — GLUCOSE, POCT (MANUAL RESULT ENTRY): POC Glucose: 66 mg/dl — AB (ref 70–99)

## 2021-01-15 MED ORDER — LANTUS SOLOSTAR 100 UNIT/ML ~~LOC~~ SOPN
35.0000 [IU] | PEN_INJECTOR | Freq: Every day | SUBCUTANEOUS | 3 refills | Status: DC
Start: 2021-01-15 — End: 2021-07-22

## 2021-01-15 MED ORDER — PEN NEEDLES 31G X 6 MM MISC: 1.0000 "pen " | Freq: Every day | 11 refills | Status: AC
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
## 2021-01-15 NOTE — Patient Instructions (Signed)
Diabetes Mellitus Basics Diabetes mellitus, or diabetes, is a long-term (chronic) disease. It occurs when the body does not properly use sugar (glucose) that is released from food after you eat. Diabetes mellitus may be caused by one or both of these problems: Your pancreas does not make enough of a hormone called insulin. Your body does not react in a normal way to the insulin that it makes. Insulin lets glucose enter cells in your body. This gives you energy. If you have diabetes, glucose cannot get into cells. This causes high blood glucose (hyperglycemia). How to treat and manage diabetes You may need to take insulin or other diabetes medicines daily to keep your glucose in balance. If you are prescribed insulin, you will learn how to give yourself insulin by injection. You may need to adjust the amount of insulin you take based on the foods that you eat. You will need to check your blood glucose levels using a glucose monitor as told by your health care provider. The readings can help determine if you have low or high blood glucose. Generally, you should have these blood glucose levels: Before meals (preprandial): 80-130 mg/dL (4.4-7.2 mmol/L). After meals (postprandial): below 180 mg/dL (10 mmol/L). Hemoglobin A1c (HbA1c) level: less than 7%. Your health care provider will set treatment goals for you. Keep all follow-up visits. This is important. Follow these instructions at home: Diabetes medicines Take your diabetes medicines every day as told by your health care provider. List your diabetes medicines here: Name of medicine: ______________________________ Amount (dose): _______________ Time (a.m./p.m.): _______________ Notes: ___________________________________ Name of medicine: ______________________________ Amount (dose): _______________ Time (a.m./p.m.): _______________ Notes: ___________________________________ Name of medicine: ______________________________ Amount (dose):  _______________ Time (a.m./p.m.): _______________ Notes: ___________________________________ Insulin If you use insulin, list the types of insulin you use here: Insulin type: ______________________________ Amount (dose): _______________ Time (a.m./p.m.): _______________Notes: ___________________________________ Insulin type: ______________________________ Amount (dose): _______________ Time (a.m./p.m.): _______________ Notes: ___________________________________ Insulin type: ______________________________ Amount (dose): _______________ Time (a.m./p.m.): _______________ Notes: ___________________________________ Insulin type: ______________________________ Amount (dose): _______________ Time (a.m./p.m.): _______________ Notes: ___________________________________ Insulin type: ______________________________ Amount (dose): _______________ Time (a.m./p.m.): _______________ Notes: ___________________________________ Managing blood glucose Check your blood glucose levels using a glucose monitor as told by your health care provider. Write down the times that you check your glucose levels here: Time: _______________ Notes: ___________________________________ Time: _______________ Notes: ___________________________________ Time: _______________ Notes: ___________________________________ Time: _______________ Notes: ___________________________________ Time: _______________ Notes: ___________________________________ Time: _______________ Notes: ___________________________________  Low blood glucose Low blood glucose (hypoglycemia) is when glucose is at or below 70 mg/dL (3.9 mmol/L). Symptoms may include: Feeling: Hungry. Sweaty and clammy. Irritable or easily upset. Dizzy. Sleepy. Having: A fast heartbeat. A headache. A change in your vision. Numbness around the mouth, lips, or tongue. Having trouble with: Moving (coordination). Sleeping. Treating low blood glucose To treat low blood  glucose, eat or drink something containing sugar right away. If you can think clearly and swallow safely, follow the 15:15 rule: Take 15 grams of a fast-acting carb (carbohydrate), as told by your health care provider. Some fast-acting carbs are: Glucose tablets: take 3-4 tablets. Hard candy: eat 3-5 pieces. Fruit juice: drink 4 oz (120 mL). Regular (not diet) soda: drink 4-6 oz (120-180 mL). Honey or sugar: eat 1 Tbsp (15 mL). Check your blood glucose levels 15 minutes after you take the carb. If your glucose is still at or below 70 mg/dL (3.9 mmol/L), take 15 grams of a carb again. If your glucose does not go above 70 mg/dL (3.9 mmol/L) after 3 tries,  get help right away. After your glucose goes back to normal, eat a meal or a snack within 1 hour. Treating very low blood glucose If your glucose is at or below 54 mg/dL (3 mmol/L), you have very low blood glucose (severe hypoglycemia). This is an emergency. Do not wait to see if the symptoms will go away. Get medical help right away. Call your local emergency services (911 in the [place] Do not drive yourself to the hospital. Questions to ask your health care provider Should I talk with a diabetes educator? What equipment will I need to care for myself at home? What diabetes medicines do I need? When should I take them? How often do I need to check my blood glucose levels? What number can I call if I have questions? When is my follow-up visit? Where can I find a support group for people with diabetes? Where to find more information American Diabetes Association: www.diabetes.org Association of Diabetes Care and Education Specialists: www.diabeteseducator.org Contact a health care provider if: Your blood glucose is at or above 240 mg/dL (13.3 mmol/L) for 2 days in a row. You have been sick or have had a fever for 2 days or more, and you are not getting better. You have any of these problems for more than 6 hours: You cannot eat or  drink. You feel nauseous. You vomit. You have diarrhea. Get help right away if: Your blood glucose is lower than 54 mg/dL (3 mmol/L). You get confused. You have trouble thinking clearly. You have trouble breathing. These symptoms may represent a serious problem that is an emergency. Do not wait to see if the symptoms will go away. Get medical help right away. Call your local emergency services (911 in the [place] Do not drive yourself to the hospital. Summary Diabetes mellitus is a chronic disease that occurs when the body does not properly use sugar (glucose) that is released from food after you eat. Take insulin and diabetes medicines as told. Check your blood glucose every day, as often as told. Keep all follow-up visits. This is important. This information is not intended to replace advice given to you by your health care provider. Make sure you discuss any questions you have with your health care provider. Document Revised: 07/30/2019 Document Reviewed: 07/30/2019 Elsevier Patient Education  2022 [employer].  Diabetes Mellitus and Nutrition, Adult When you have diabetes, or diabetes mellitus, it is very important to have healthy eating habits because your blood sugar (glucose) levels are greatly affected by what you eat and drink. Eating healthy foods in the right amounts, at about the same times every day, can help you: Control your blood glucose. Lower your risk of heart disease. Improve your blood pressure. Reach or maintain a healthy weight. What can affect my meal plan? Every person with diabetes is different, and each person has different needs for a meal plan. Your health care provider may recommend that you work with a dietitian to make a meal plan that is best for you. Your meal plan may vary depending on factors such as: The calories you need. The medicines you take. Your weight. Your blood glucose, blood pressure, and cholesterol levels. Your activity level. Other  health conditions you have, such as heart or kidney disease. How do carbohydrates affect me? Carbohydrates, also called carbs, affect your blood glucose level more than any other type of food. Eating carbs naturally raises the amount of glucose in your blood. Carb counting is a method for keeping track  of how many carbs you eat. Counting carbs is important to keep your blood glucose at a healthy level, especially if you use insulin or take certain oral diabetes medicines. It is important to know how many carbs you can safely have in each meal. This is different for every person. Your dietitian can help you calculate how many carbs you should have at each meal and for each snack. How does alcohol affect me? Alcohol can cause a sudden decrease in blood glucose (hypoglycemia), especially if you use insulin or take certain oral diabetes medicines. Hypoglycemia can be a life-threatening condition. Symptoms of hypoglycemia, such as sleepiness, dizziness, and confusion, are similar to symptoms of having too much alcohol. Do not drink alcohol if: Your health care provider tells you not to drink. You are pregnant, may be pregnant, or are planning to become pregnant. If you drink alcohol: Do not drink on an empty stomach. Limit how much you use to: 0-1 drink a day for women. 0-2 drinks a day for men. Be aware of how much alcohol is in your drink. In the [place] one drink equals one 12 oz bottle of beer (355 mL), one 5 oz glass of wine (148 mL), or one 1 oz glass of hard liquor (44 mL). Keep yourself hydrated with water, diet soda, or unsweetened iced tea. Keep in mind that regular soda, juice, and other mixers may contain a lot of sugar and must be counted as carbs. What are tips for following this plan? Reading food labels Start by checking the serving size on the "Nutrition Facts" label of packaged foods and drinks. The amount of calories, carbs, fats, and other nutrients listed on the label is based on  one serving of the item. Many items contain more than one serving per package. Check the total grams (g) of carbs in one serving. You can calculate the number of servings of carbs in one serving by dividing the total carbs by 15. For example, if a food has 30 g of total carbs per serving, it would be equal to 2 servings of carbs. Check the number of grams (g) of saturated fats and trans fats in one serving. Choose foods that have a low amount or none of these fats. Check the number of milligrams (mg) of salt (sodium) in one serving. Most people should limit total sodium intake to less than 2,300 mg per day. Always check the nutrition information of foods labeled as "low-fat" or "nonfat." These foods may be higher in added sugar or refined carbs and should be avoided. Talk to your dietitian to identify your daily goals for nutrients listed on the label. Shopping Avoid buying canned, pre-made, or processed foods. These foods tend to be high in fat, sodium, and added sugar. Shop around the outside edge of the grocery store. This is where you will most often find fresh fruits and vegetables, bulk grains, fresh meats, and fresh dairy. Cooking Use low-heat cooking methods, such as baking, instead of high-heat cooking methods like deep frying. Cook using healthy oils, such as olive, canola, or sunflower oil. Avoid cooking with butter, cream, or high-fat meats. Meal planning Eat meals and snacks regularly, preferably at the same times every day. Avoid going long periods of time without eating. Eat foods that are high in fiber, such as fresh fruits, vegetables, beans, and whole grains. Talk with your dietitian about how many servings of carbs you can eat at each meal. Eat 4-6 oz (112-168 g) of lean protein each day,  such as lean meat, chicken, fish, eggs, or tofu. One ounce (oz) of lean protein is equal to: 1 oz (28 g) of meat, chicken, or fish. 1 egg.  cup (62 g) of tofu. Eat some foods each day that  contain healthy fats, such as avocado, nuts, seeds, and fish. What foods should I eat? Fruits Berries. Apples. Oranges. Peaches. Apricots. Plums. Grapes. Mango. Papaya. Pomegranate. Kiwi. Cherries. Vegetables Lettuce. Spinach. Leafy greens, including kale, chard, collard greens, and mustard greens. Beets. Cauliflower. Cabbage. Broccoli. Carrots. Green beans. Tomatoes. Peppers. Onions. Cucumbers. Brussels sprouts. Grains Whole grains, such as whole-wheat or whole-grain bread, crackers, tortillas, cereal, and pasta. Unsweetened oatmeal. Quinoa. Brown or wild rice. Meats and other proteins Seafood. Poultry without skin. Lean cuts of poultry and beef. Tofu. Nuts. Seeds. Dairy Low-fat or fat-free dairy products such as milk, yogurt, and cheese. The items listed above may not be a complete list of foods and beverages you can eat. Contact a dietitian for more information. What foods should I avoid? Fruits Fruits canned with syrup. Vegetables Canned vegetables. Frozen vegetables with butter or cream sauce. Grains Refined white flour and flour products such as bread, pasta, snack foods, and cereals. Avoid all processed foods. Meats and other proteins Fatty cuts of meat. Poultry with skin. Breaded or fried meats. Processed meat. Avoid saturated fats. Dairy Full-fat yogurt, cheese, or milk. Beverages Sweetened drinks, such as soda or iced tea. The items listed above may not be a complete list of foods and beverages you should avoid. Contact a dietitian for more information. Questions to ask a health care provider Do I need to meet with a diabetes educator? Do I need to meet with a dietitian? What number can I call if I have questions? When are the best times to check my blood glucose? Where to find more information: American Diabetes Association: diabetes.org Academy of Nutrition and Dietetics: www.eatright.[employer] of Diabetes and Digestive and Kidney Diseases:  [employer] Association of Diabetes Care and Education Specialists: www.diabeteseducator.org Summary It is important to have healthy eating habits because your blood sugar (glucose) levels are greatly affected by what you eat and drink. A healthy meal plan will help you control your blood glucose and maintain a healthy lifestyle. Your health care provider may recommend that you work with a dietitian to make a meal plan that is best for you. Keep in mind that carbohydrates (carbs) and alcohol have immediate effects on your blood glucose levels. It is important to count carbs and to use alcohol carefully. This information is not intended to replace advice given to you by your health care provider. Make sure you discuss any questions you have with your health care provider. Document Revised: 03/05/2019 Document Reviewed: 03/05/2019 Elsevier Patient Education  2021 [employer].

## 2021-01-16 ENCOUNTER — Encounter: Payer: Self-pay | Admitting: Nurse Practitioner

## 2021-01-18 ENCOUNTER — Other Ambulatory Visit: Payer: Self-pay

## 2021-01-18 ENCOUNTER — Ambulatory Visit (HOSPITAL_COMMUNITY)
Admission: RE | Admit: 2021-01-18 | Discharge: 2021-01-18 | Disposition: A | Discharge status: Home / Self Care | Payer: Medicare Other | Source: Ambulatory Visit | Attending: Cardiology | Admitting: Cardiology

## 2021-01-18 DIAGNOSIS — I5042 Chronic combined systolic (congestive) and diastolic (congestive) heart failure: Secondary | ICD-10-CM | POA: Insufficient documentation

## 2021-01-18 LAB — BASIC METABOLIC PANEL
Anion gap: 8 (ref 5–15)
BUN: 48 mg/dL — ABNORMAL HIGH (ref 6–20)
CO2: 27 mmol/L (ref 22–32)
Calcium: 9 mg/dL (ref 8.9–10.3)
Chloride: 101 mmol/L (ref 98–111)
Creatinine, Ser: 3.06 mg/dL — ABNORMAL HIGH (ref 0.61–1.24)
GFR, Estimated: 24 mL/min — ABNORMAL LOW (ref >60)
Glucose, Bld: 178 mg/dL — ABNORMAL HIGH (ref 70–99)
Potassium: 4.4 mmol/L (ref 3.5–5.1)
Sodium: 136 mmol/L (ref 135–145)

## 2021-01-22 ENCOUNTER — Ambulatory Visit: Payer: Medicare Other | Admitting: Nurse Practitioner

## 2021-01-25 ENCOUNTER — Telehealth (HOSPITAL_COMMUNITY): Payer: Self-pay

## 2021-01-25 NOTE — Telephone Encounter (Signed)
Contacted pharmacy to order the following:  Amiodarone Farxiga  Vascepa  Lasix

## 2021-01-26 NOTE — Progress Notes (Signed)
Advanced Heart Failure Clinic Note   PCP: Kathe Becton, FNP HF Cardiology: Dr. Aundra Dubin  Reason for Visit: f/u for chronic systolic heart failure   Mr Bily is a 50 y.o. with a history of with deafness, HTN, DM, and chronic systolic HF.   Patient had no known cardiac problems until 10/16.  At that time, he developed exertional dyspnea and was admitted to a hospital in [place], [place] with acute systolic CHF.  Echo 10/16 showed EF 15-20%.  Cardiac cath showed no significant coronary disease.  He was diuresed and started on cardiac meds. Subsequently, he moved to [facility].  Now working in a [employer].  Repeat echo in 5/18 showed persistently low EF, 25-30%.  He had Hayden placed.   He was admitted briefly with CHF exacerbation in 8/20.    Echo in 9/20 showed EF 30-35% with diffuse hypokinesis.   Ultrasound in 4/21 showed right leg DVT and CT showed a PE.  No obvious trigger (no long trip, no surgery, no prolonged immobility).  He was started on apixaban.   He quit taking his meds in 8/21 and was drinking heavily due to depression, later restarted meds.  In 10/21, he was admitted with DKA and atrial fibrillation/RVR (1st known episode).  He converted to NSR spontaneously.  [name] was stopped due to AKI.  He had melena/hematochezia and EGD showed gastritis/esophagitis.  Apixaban was stopped.  He went home but then was re-admitted later in 10/21 with bilateral PEs.   Echo in 10/21 showed EF 45%, mild global hypokinesis, moderate LVH, mildly decreased RV systolic function.    On 05/05/20, he had a near-syncopal event and went to the ER, no events on ICD interrogation.  ?Orthostatic.  No further dizziness/lightheadedness.  Repeat RHC (2/22) showed optimized filling pressures, mild pulmonary hypertension, preserved cardiac output. Suspect elevated PA pressure may be related to OSA.    Admitted [id number]  With A/C systolic heart failure. Diuresed with IV lasix and transitioned to  lasix 60 mg daily. Discharge weight 232 pounds.   09/10/20 he presented for scheduled cardiomems and was noted to be in A fib RVR. RHC with succesful cardiomems implant. Stable hemodynamics on cath. Admitted from cath lab with A fib RVR and started on amio drip. Chemically converted to NSR was transitioned to amio 200 mg bid.  Also incidentally found to be COVID +.   09/30/20, he was admitted w/ acute subcortical CVA. Unable to get cMRI given ICD. Neurology felt related to small vessel disease. Eliquis continued. He was d/c to CIR for rehab.   Today he returns for HF follow up with interpretor. He feels ok. More short of breath at night, sleeping up right. Overall feeling fine. Denies CP, dizziness, edema, or abnormal bleeding.  Appetite ok. No fever or chills. Taking all medications. He still does not have his CPAP due to national backorder. Works in [employer] at [employer]. Followed by Paramedicine. Cut back on eating fast food.  Cardiomems from 01/27/21 PAd 36, no goal set (personally reviewed).  ECG (personally reviewed): SR with AVB, PR 232 ms, QTc 510 ms  Development worker, international aid: HL score 0, thoracic impedence stable, 4.8 hrs activity/day, no shocks (personally reviewed).  Labs (9/17): BNP 477, K 4.5 => 4.2, creatinine 0.92 => 1.1, BNP 1009, HIV negative, SPEP negative.  Labs (10/17): K 5.2, creatinine 1.10, hgb 12.5 Labs (03/28/2016): K 5.3 Creatinine 1.07  Labs (3/18): digoxin < 0.2, K 4.1, creatinine 0.97 Labs (9/18): hgb 12.2, K  4, creatinine 0.99, BNP 139 Labs (1/19): hgb 11.2, K 4.8, creatinine 1.26 Labs (8/20): K 3.7, creatinine 1.69 Labs (5/21): K 5, creatinine 1.4, LDL 192 Labs (10/21): K 4.1, creatinine 1.77, LDL 180, TGs 138 Labs (11/21): K 4.5, creatinine 2.68 Labs (12/21): K 5.1, creatinine 2.25 Labs (04/30/20): K 4.7, creatinine 2.7 Labs (05/05/20): K 4.0, creatinine 2.15 Labs (05/11/20): K 4.2, creatinine 2.43, LDL 76, HDL 38, TGs 220 Labs (2/22): K 4.4,  creatinine 2.69 Labs (2/22): SCr 2.56, GFR 29, K 3.9  Labs (8/22): K 5.0, creatinine 2.84, LDL 205, HDL 42, TGs 384 Labs (9/22): K 4.4, creatinine 3.06  PMH: 1. HTN 2. Diabetes 3. Hyperlipidemia 4. Deafness since age 77: Needs sign language interpreter.  5. Chronic systolic CHF: Nonischemic cardiomyopathy.  Diagnosis 10/16 in [place], [place] (admitted with acute systolic CHF).  SPEP and HIV negative.  [place].  - Echo (10/16): EF 15-20%, mild LVH, mild MR, normal RV size and systolic function.  - LHC (10/16): No significant CAD.  - CPX (12/17): Mild functional limitation.  - 04/2016 CMRI: EF 27%. RV normal. No evidence of infiltrative disease, myocarditis, or MI.  - Echo (5/18): EF 25-30%, mild LV dilation, mild LVH, normal RV size with mildly decreased - Echo (9/20): EF 30-35%, mild LVH, diffuse hypokinesis, normal RV size and systolic function.  - Echo (10/21): EF 45%, mild global hypokinesis, moderate LVH, mildly decreased RV systolic function.  - RHC (2/22): Optimized filling pressures, mild pulmonary hypertension, preserved cardiac output. Continue current diuretics.  Suspect elevated PA pressure may be related to OSA.   6. Depression. 7. Colitis episodes.  8. CKD stage 3.  9. DVT/PE in 4/21: No obvious trigger.  - Recurrent PE in 10/21, had been off anticoagulation.  10. Atrial fibrillation: Paroxysmal.  11. Gastritis/esophagitis: 10/21.  12. ETOH abuse  SH: From [place] originally.  Lives in [place] until 2017, then moved to [place].  Nonsmoker.  Unemployed.  H/o ETOH abuse.    FH: No known cardiac disease.   ROS: All systems reviewed and negative except as per HPI.   Current Outpatient Medications  Medication Sig Dispense Refill   acetaminophen (TYLENOL) 325 MG tablet Take 2 tablets (650 mg total) by mouth every 4 (four) hours as needed for mild pain (or temp > 37.5 C (99.5 F)).     amiodarone (PACERONE) 200 MG tablet Take 1 tablet (200 mg total) by mouth 2 (two)  times daily. 60 tablet 6   apixaban (ELIQUIS) 5 MG TABS tablet TAKE 1 TABLET(5 MG) BY MOUTH TWICE DAILY 60 tablet 11   atorvastatin (LIPITOR) 80 MG tablet Take 1 tablet (80 mg total) by mouth at bedtime. 30 tablet 11   carvedilol (COREG) 25 MG tablet TAKE 1 TABLET (25 MG TOTAL) BY MOUTH 2 (TWO) TIMES DAILY. 60 tablet 0   dapagliflozin propanediol (FARXIGA) 10 MG TABS tablet Take 1 tablet (10 mg total) by mouth daily before breakfast. 90 tablet 3   Evolocumab (REPATHA SURECLICK) [id number] MG/ML SOAJ Inject 140 mg into the skin every 14 (fourteen) days. 2 mL 11   fenofibrate 54 MG tablet Take 54 mg by mouth daily.     furosemide (LASIX) 20 MG tablet Take 2 tablets (40 mg total) by mouth daily. 60 tablet 0   hydrALAZINE (APRESOLINE) 100 MG tablet Take 100 mg by mouth 3 (three) times daily.     insulin glargine (LANTUS SOLOSTAR) 100 UNIT/ML Solostar Pen Inject 35 Units into the skin at bedtime. 31.5 mL 3  Insulin Pen Needle (PEN NEEDLES) 31G X 6 MM MISC 1 pen by Does not apply route daily. 100 each 11   isosorbide mononitrate (IMDUR) 60 MG 24 hr tablet Take 1 tablet (60 mg total) by mouth daily. 30 tablet 11   latanoprost (XALATAN) 0.005 % ophthalmic solution Place 1 drop into both eyes at bedtime.     meclizine (ANTIVERT) 25 MG tablet Take 1 tablet (25 mg total) by mouth 3 (three) times daily as needed for dizziness. 90 tablet 3   pantoprazole (PROTONIX) 40 MG tablet Take 1 tablet (40 mg total) by mouth 2 (two) times daily. 180 tablet 3   sertraline (ZOLOFT) 50 MG tablet TAKE 1 TABLET (50 MG TOTAL) BY MOUTH DAILY. 30 tablet 0   VASCEPA 1 g capsule Take 2 capsules (2 g total) by mouth 2 (two) times daily. 120 capsule 5   No current facility-administered medications for this encounter.   BP 120/78   Pulse 60   Wt 111.7 kg (246 lb 4 oz)   SpO2 96%   BMI 35.33 kg/m    Wt Readings from Last 3 Encounters:  01/29/21 111.7 kg (246 lb 4 oz)  01/28/21 110.2 kg (243 lb)  01/15/21 108.3 kg (238 lb 12.8 oz)     PHYSICAL EXAM: General:  NAD. No resp difficulty, appears tired HEENT: Normal Neck: Supple. No JVD. Carotids 2+ bilat; no bruits. No lymphadenopathy or thryomegaly appreciated. Cor: PMI nondisplaced. Regular rate & rhythm. No rubs, gallops or murmurs. Lungs: Clear Abdomen: Obese, nontender, nondistended. No hepatosplenomegaly. No bruits or masses. Good bowel sounds. Extremities: No cyanosis, clubbing, rash, edema Neuro: Alert & oriented x 3, cranial nerves grossly intact. Moves all 4 extremities w/o difficulty. Affect pleasant.  Assessment/Plan: 1. Chronic systolic CHF: Nonischemic cardiomyopathy, etiology uncertain. Consider prior ETOH or HTN.  He has a [place].  cMRI in 1/18 with EF 27%, RV normal, no evidence of infiltrative disease, myocarditis, or MI. Echo in 9/20 showed EF 30-35%.  Echo in 10/21 showed EF up to 45% with mild RV dysfunction.  Repeat RHC (2/22) with optimized filling pressures, preserved CO and moderate pulmonary hypertension-->likely due to untreated sleep apnea. Still awaiting CPAP device. Cardiomems placed 6/22. RHC at time of placement: RA 8, PCWP 9, CI 1.93 (Fick), 2.7 (Thermo). NYHA II-early III, he is not volume overloaded on exam or by device interrogation. PAd on Cardiomems 2 days ago was 36. His weight is up ~5lbs.  - He has been enrolled in monthly fluid monitoring with the device clinic. - Continue Lasix 40 mg daily. Can take extra tablet today and tomorrow. BMET today,  - Continue Farxiga 10 mg daily. - Continue Coreg 25 mg bid.    - Continue hydralazine 100 mg tid + Imdur 60 mg daily. - Off Entresto and spironolactone with elevated creatinine.  - Advised to improve compliance w/ cardiomems.  2. Type II diabetes: management per PCP. Continue Lantus. He does not check his blood sugars at home. Most recent Hgb A1c 7.8.  - On Farxiga. 3. HTN: Controlled. - Continue meds as above. 4. Deafness: Sign language interpreter present at visit.  5.  CKD: Stage 3.  - BMET today. - Continue Wilder Glade as long as GFR > 25 - He is now followed by Nephrology. 6. PE/DVT: 4/21.  Unclear cause, no prolonged trip, prolonged immobility, or history of malignancy. No FH clotting. Recurrent PE in 10/21 while Eliquis on hold with bleeding from esophagitis/gastritis.  Do no think this was an  Eliquis failure.  - Continue apixaban, think he will need long-term with no clear trigger for VTE + AFib. No bleeding issues.  7. Hyperlipidemia: On atorvastatin, fenofibrate and Vascepa. Lipids 11/18/20 LDL not at goal of <70. He has been referred to Lipid Clinic ? Repatha. 8. Atrial fibrillation: First noted in setting of DKA in 10/21.  Had been on amiodarone but discontinued 5/22. Had recurrent Afib 6/22. - Given recurrence, he has been referred to EP for ablation consideration. Appt next week. - SR today, decrease amiodarone to 200 mg daily. - Continue Eliquis. No bleeding. 9. ETOH abuse: ETOH may have played a role in both cardiomyopathy and atrial fibrillation.  No longer drinking alcohol, lives with roommate who regularly drinks ETOH.  10. Severe OSA: recent sleep study (AHI = 125.7/h). Followed by Dr. Radford Pax. Awaiting CPAP device, they are backed up.  - Awaiting CPAP, national back order. 11. Noncompliance:  Previous medication noncompliance issues. - Continue HF Paramedicine.  12. CVA: 6/22, subcortical CVA. Unable to get cMRI given ICD. Neurology felt related to small vessel disease. No residual deficits - Continue Eliquis.   Continue w/ paramedicine.  - Follow up with Dr. Aundra Dubin in 2 months as scheduled.  [place], FNP-BC 01/29/2021

## 2021-01-28 ENCOUNTER — Other Ambulatory Visit (HOSPITAL_COMMUNITY): Payer: Self-pay

## 2021-01-28 ENCOUNTER — Telehealth (HOSPITAL_COMMUNITY): Payer: Self-pay

## 2021-01-28 NOTE — Telephone Encounter (Signed)
Called and left patient a detailed message of all the below information to confirm/remind patient of their appointment at the [facility] on 01/28/21.   I also left a message to for patient to bring all medications and/or complete list.

## 2021-01-28 NOTE — Progress Notes (Signed)
Paramedicine Encounter    Patient ID: Troy Rush, male    DOB: Apr 20, 1970, 50 y.o.   MRN: [id number]   Patient Care Team: Vevelyn Francois, NP as PCP - General (Adult Health Nurse Practitioner) Larey Dresser, MD as PCP - Cardiology (Cardiology) Conrad [place], NP as Nurse Practitioner (Cardiology) Larey Dresser, MD as Consulting Physician (Cardiology) Jorge Ny, LCSW as Social Worker (Licensed Clinical Social Worker)  Patient Active Problem List   Diagnosis Date Noted   Subcortical infarction [facility]) 10/03/2020   Cerebral thrombosis with cerebral infarction 10/01/2020   Unilateral weakness 09/30/2020   History of atrial fibrillation 09/30/2020   History of DVT (deep vein thrombosis) 09/30/2020   CKD (chronic kidney disease) stage 3, GFR 30-59 ml/min ([place]) 07/08/2020   Acute CHF (congestive heart failure) ([place]) 07/07/2020   CKD (chronic kidney disease), stage IV ([place]) 07/07/2020   ICD (implantable cardioverter-defibrillator) in place 05/14/2020   Pulmonary embolism ([place]) 02/05/2020   Acute kidney injury superimposed on CKD ([place])    Chronic combined systolic and diastolic heart failure ([place])    Atrial fibrillation with RVR ([place]) 02/01/2020   NSVT (nonsustained ventricular tachycardia) 02/01/2020   Acute GI bleeding 02/01/2020   DKA (diabetic ketoacidosis) ([place]) 02/01/2020   Bilateral deafness 08/15/2019   Type 2 diabetes mellitus with complication, with long-term current use of insulin ([place]) 08/15/2019   Hemoglobin A1C between 7% and 9% indicating borderline diabetic control ([place]) 08/15/2019   Insomnia 08/15/2019   Abdominal pain 11/14/2018   Ischemic cardiomyopathy 10/14/2016   NICM (nonischemic cardiomyopathy) ([place]) 123XX123   Chronic systolic CHF (congestive heart failure) ([place]) 05/30/2016   HTN (hypertension) 05/30/2016   Deaf 05/30/2016   Hyperkalemia 05/30/2016   Major depressive disorder, recurrent episode ([place]) 03/21/2016   Bronchitis 01/21/2015    Dyslipidemia 01/21/2015   Vitamin D deficiency 12/19/2011   Personal history of other infectious and parasitic diseases 10/25/2011   Anxiety disorder 05/12/2011   Hyperlipidemia 05/12/2011   Hypertriglyceridemia 05/12/2011   Obstructive sleep apnea 05/12/2011    Current Outpatient Medications:    acetaminophen (TYLENOL) 325 MG tablet, Take 2 tablets (650 mg total) by mouth every 4 (four) hours as needed for mild pain (or temp > 37.5 C (99.5 F))., Disp: , Rfl:    amiodarone (PACERONE) 200 MG tablet, Take 1 tablet (200 mg total) by mouth 2 (two) times daily., Disp: 60 tablet, Rfl: 6   apixaban (ELIQUIS) 5 MG TABS tablet, TAKE 1 TABLET(5 MG) BY MOUTH TWICE DAILY, Disp: 60 tablet, Rfl: 11   atorvastatin (LIPITOR) 80 MG tablet, Take 1 tablet (80 mg total) by mouth at bedtime., Disp: 30 tablet, Rfl: 11   carvedilol (COREG) 25 MG tablet, TAKE 1 TABLET (25 MG TOTAL) BY MOUTH 2 (TWO) TIMES DAILY., Disp: 60 tablet, Rfl: 0   dapagliflozin propanediol (FARXIGA) 10 MG TABS tablet, Take 1 tablet (10 mg total) by mouth daily before breakfast., Disp: 90 tablet, Rfl: 3   Evolocumab (REPATHA SURECLICK) [id number] MG/ML SOAJ, Inject 140 mg into the skin every 14 (fourteen) days., Disp: 2 mL, Rfl: 11   furosemide (LASIX) 20 MG tablet, Take 2 tablets (40 mg total) by mouth daily., Disp: 60 tablet, Rfl: 0   hydrALAZINE (APRESOLINE) 100 MG tablet, Take 100 mg by mouth 3 (three) times daily., Disp: , Rfl:    insulin glargine (LANTUS SOLOSTAR) 100 UNIT/ML Solostar Pen, Inject 35 Units into the skin at bedtime., Disp: 31.5 mL, Rfl: 3   Insulin Pen Needle (PEN NEEDLES)  31G X 6 MM MISC, 1 pen by Does not apply route daily., Disp: 100 each, Rfl: 11   isosorbide mononitrate (IMDUR) 60 MG 24 hr tablet, Take 1 tablet (60 mg total) by mouth daily., Disp: 30 tablet, Rfl: 11   latanoprost (XALATAN) 0.005 % ophthalmic solution, Place 1 drop into both eyes at bedtime., Disp: , Rfl:    meclizine (ANTIVERT) 25 MG tablet, Take 1 tablet (25  mg total) by mouth 3 (three) times daily as needed for dizziness., Disp: 90 tablet, Rfl: 3   pantoprazole (PROTONIX) 40 MG tablet, Take 1 tablet (40 mg total) by mouth 2 (two) times daily., Disp: 180 tablet, Rfl: 3   sertraline (ZOLOFT) 50 MG tablet, TAKE 1 TABLET (50 MG TOTAL) BY MOUTH DAILY., Disp: 30 tablet, Rfl: 0   VASCEPA 1 g capsule, Take 2 capsules (2 g total) by mouth 2 (two) times daily., Disp: 120 capsule, Rfl: 5 Allergies  Allergen Reactions   Lisinopril Cough      Social History   Socioeconomic History   Marital status: Legally Separated    Spouse name: Not on file   Number of children: Not on file   Years of education: Not on file   Highest education level: Not on file  Occupational History   Not on file  Tobacco Use   Smoking status: Never   Smokeless tobacco: Never  Vaping Use   Vaping Use: Never used  Substance and Sexual Activity   Alcohol use: Yes    Alcohol/week: 1.0 - 2.0 standard drink    Types: 1 - 2 Glasses of wine per week    Comment: rare   Drug use: No   Sexual activity: Yes    Birth control/protection: None  Other Topics Concern   Not on file  Social History Narrative   Not on file   Social Determinants of Health   Financial Resource Strain: Medium Risk   Difficulty of Paying Living Expenses: Somewhat hard  Food Insecurity: No Food Insecurity   Worried About Charity fundraiser in the Last Year: Never true   Ran Out of Food in the Last Year: Never true  Transportation Needs: No Transportation Needs   Lack of Transportation (Medical): No   Lack of Transportation (Non-Medical): No  Physical Activity: Not on file  Stress: Not on file  Social Connections: Not on file  Intimate Partner Violence: Not on file    Physical Exam      Future Appointments  Date Time Provider [place]  01/29/2021  9:00 AM [facility] PA/NP [facility] None  02/04/2021  2:00 PM Constance Haw, MD [facility]  02/23/2021  9:15 AM  [facility] LAB [facility]  03/22/2021  8:35 AM [facility] DEVICE REMOTES [facility]  04/09/2021 11:40 AM Larey Dresser, MD [facility] None  04/19/2021 11:00 AM Vevelyn Francois, NP [place] None  05/27/2021  8:45 AM Frann Rider, NP [facility] None  06/21/2021  8:35 AM [facility] DEVICE REMOTES [facility]  09/20/2021  8:35 AM [facility] DEVICE REMOTES [facility]  12/20/2021  8:35 AM [facility] DEVICE REMOTES [facility]    There were no vitals taken for this visit.  Weight yesterday-? Last visit weight-246  Pt reports he is feeling ok, last visit I had to give him IV lasix due to weight gain and distention, he reported having lots or urine output, but it seems his weight is back up to 244. He does go to clinic in the morning. I called [facility]  to set that up.  He has only missed one dose of his AM meds for the past 2 wks but missed 3 of his PM meds.  He reports sob when trying to sleep, he is still waiting on CPAP. Abd appears distended and some edema to legs.  No c/p, no dizziness.  I am only filling one pill box up for now in case there are changes tomor.  Will f/u early next week if there are changes.    Refills needed for next week- Repatha -pharmacy will fill it and I will get it Monday.    [name], [facility] Paramedic  01/28/21

## 2021-01-29 ENCOUNTER — Other Ambulatory Visit: Payer: Self-pay

## 2021-01-29 ENCOUNTER — Ambulatory Visit (HOSPITAL_COMMUNITY)
Admission: RE | Admit: 2021-01-29 | Discharge: 2021-01-29 | Disposition: A | Discharge status: Home / Self Care | Payer: Medicare Other | Source: Ambulatory Visit | Attending: Family Medicine | Admitting: Family Medicine

## 2021-01-29 ENCOUNTER — Encounter (HOSPITAL_COMMUNITY): Payer: Self-pay

## 2021-01-29 VITALS — BP 120/78 | HR 60 | Wt 246.2 lb

## 2021-01-29 DIAGNOSIS — Z794 Long term (current) use of insulin: Secondary | ICD-10-CM | POA: Insufficient documentation

## 2021-01-29 DIAGNOSIS — Z86718 Personal history of other venous thrombosis and embolism: Secondary | ICD-10-CM | POA: Diagnosis not present

## 2021-01-29 DIAGNOSIS — I480 Paroxysmal atrial fibrillation: Secondary | ICD-10-CM | POA: Insufficient documentation

## 2021-01-29 DIAGNOSIS — Z95810 Presence of automatic (implantable) cardiac defibrillator: Secondary | ICD-10-CM | POA: Diagnosis not present

## 2021-01-29 DIAGNOSIS — Z560 Unemployment, unspecified: Secondary | ICD-10-CM | POA: Insufficient documentation

## 2021-01-29 DIAGNOSIS — Z79899 Other long term (current) drug therapy: Secondary | ICD-10-CM | POA: Diagnosis not present

## 2021-01-29 DIAGNOSIS — N1832 Chronic kidney disease, stage 3b: Secondary | ICD-10-CM

## 2021-01-29 DIAGNOSIS — Z8673 Personal history of transient ischemic attack (TIA), and cerebral infarction without residual deficits: Secondary | ICD-10-CM | POA: Insufficient documentation

## 2021-01-29 DIAGNOSIS — Z7901 Long term (current) use of anticoagulants: Secondary | ICD-10-CM | POA: Insufficient documentation

## 2021-01-29 DIAGNOSIS — I428 Other cardiomyopathies: Secondary | ICD-10-CM | POA: Insufficient documentation

## 2021-01-29 DIAGNOSIS — I130 Hypertensive heart and chronic kidney disease with heart failure and stage 1 through stage 4 chronic kidney disease, or unspecified chronic kidney disease: Secondary | ICD-10-CM | POA: Diagnosis not present

## 2021-01-29 DIAGNOSIS — F1011 Alcohol abuse, in remission: Secondary | ICD-10-CM

## 2021-01-29 DIAGNOSIS — G4733 Obstructive sleep apnea (adult) (pediatric): Secondary | ICD-10-CM | POA: Diagnosis not present

## 2021-01-29 DIAGNOSIS — H9190 Unspecified hearing loss, unspecified ear: Secondary | ICD-10-CM | POA: Diagnosis not present

## 2021-01-29 DIAGNOSIS — Z91199 Patient's noncompliance with other medical treatment and regimen due to unspecified reason: Secondary | ICD-10-CM

## 2021-01-29 DIAGNOSIS — I10 Essential (primary) hypertension: Secondary | ICD-10-CM | POA: Diagnosis not present

## 2021-01-29 DIAGNOSIS — E785 Hyperlipidemia, unspecified: Secondary | ICD-10-CM | POA: Insufficient documentation

## 2021-01-29 DIAGNOSIS — Z86711 Personal history of pulmonary embolism: Secondary | ICD-10-CM | POA: Insufficient documentation

## 2021-01-29 DIAGNOSIS — H9193 Unspecified hearing loss, bilateral: Secondary | ICD-10-CM | POA: Diagnosis not present

## 2021-01-29 DIAGNOSIS — N183 Chronic kidney disease, stage 3 unspecified: Secondary | ICD-10-CM | POA: Insufficient documentation

## 2021-01-29 DIAGNOSIS — E1122 Type 2 diabetes mellitus with diabetic chronic kidney disease: Secondary | ICD-10-CM | POA: Insufficient documentation

## 2021-01-29 DIAGNOSIS — E118 Type 2 diabetes mellitus with unspecified complications: Secondary | ICD-10-CM | POA: Diagnosis not present

## 2021-01-29 DIAGNOSIS — Z8616 Personal history of COVID-19: Secondary | ICD-10-CM | POA: Diagnosis not present

## 2021-01-29 DIAGNOSIS — I5022 Chronic systolic (congestive) heart failure: Secondary | ICD-10-CM | POA: Insufficient documentation

## 2021-01-29 LAB — BASIC METABOLIC PANEL
Anion gap: 6 (ref 5–15)
BUN: 32 mg/dL — ABNORMAL HIGH (ref 6–20)
CO2: 25 mmol/L (ref 22–32)
Calcium: 8.9 mg/dL (ref 8.9–10.3)
Chloride: 106 mmol/L (ref 98–111)
Creatinine, Ser: 2.27 mg/dL — ABNORMAL HIGH (ref 0.61–1.24)
GFR, Estimated: 34 mL/min — ABNORMAL LOW (ref >60)
Glucose, Bld: 170 mg/dL — ABNORMAL HIGH (ref 70–99)
Potassium: 4.6 mmol/L (ref 3.5–5.1)
Sodium: 137 mmol/L (ref 135–145)

## 2021-01-29 NOTE — Patient Instructions (Signed)
Decrease Amiodarone to 200 mg Daily  Can take extra Furosemide TODAY AND TOMORROW if needed  Labs done today, your results will be available in MyChart, we will contact you for abnormal readings.  Your physician recommends that you keep your follow-up appointment with Dr Curt Bears as scheduled 02/04/21  Your physician recommends that you schedule a follow-up appointment in: 2 months (04/09/21) as scheduled  Do the following things EVERYDAY: Weigh yourself in the morning before breakfast. Write it down and keep it in a log. Take your medicines as prescribed Eat low salt foods--Limit salt (sodium) to 2000 mg per day.  Stay as active as you can everyday Limit all fluids for the day to less than 2 liters  If you have any questions or concerns before your next appointment please send [place] a message through [place] or call our office at [phone].    TO LEAVE A MESSAGE FOR THE NURSE SELECT OPTION 2, PLEASE LEAVE A MESSAGE INCLUDING: YOUR NAME DATE OF BIRTH CALL BACK NUMBER REASON FOR CALL**this is important as we prioritize the call backs  YOU WILL RECEIVE A CALL BACK THE SAME DAY AS LONG AS YOU CALL BEFORE 4:00 PM  At the [facility], you and your health needs are our priority. As part of our continuing mission to provide you with exceptional heart care, we have created designated Provider Care Teams. These Care Teams include your primary Cardiologist (physician) and Advanced Practice Providers (APPs- Physician Assistants and Nurse Practitioners) who all work together to provide you with the care you need, when you need it.   You may see any of the following providers on your designated Care Team at your next follow up: Dr Glori Bickers Dr Haynes Kerns, NP Lyda Jester, [place] [facility] [place], [place] Audry Riles, PharmD   Please be sure to bring in all your medications bottles to every appointment.

## 2021-02-01 ENCOUNTER — Other Ambulatory Visit (HOSPITAL_COMMUNITY): Payer: Self-pay | Admitting: Adult Health

## 2021-02-01 ENCOUNTER — Encounter (HOSPITAL_COMMUNITY): Payer: Self-pay | Admitting: Adult Health

## 2021-02-01 ENCOUNTER — Other Ambulatory Visit (HOSPITAL_COMMUNITY): Payer: Self-pay

## 2021-02-01 MED ORDER — FUROSEMIDE 20 MG PO TABS: 80.0000 mg | ORAL_TABLET | Freq: Every day | ORAL | 6 refills | Status: DC

## 2021-02-01 NOTE — Progress Notes (Signed)
P/u his repatha from pharmacy, they are unable to laeve that at the door step since it has to be in fridge.  So I p/u and brought to him.   [name], EMT-Paramedic  02/01/21

## 2021-02-01 NOTE — Progress Notes (Signed)
Order placed for lasix 80 mg po daily.   Pyper Olexa NP-C  4:41 PM

## 2021-02-01 NOTE — Progress Notes (Signed)
   Cardiomems Reading persistently elevated.   RHC at implant PA 37/14 mean 25. PCWP 9   09/10/2020 PAD on Cardiomems was 20  Goal has not been set because he was having issues with his pillow.   Today reading is 34. Suggestive of volume overload.   Increase home lasix to 80 mg po daily.   Discussed with HF Paramedic.   Troy Rush NP_ C  4:40 PM

## 2021-02-03 ENCOUNTER — Other Ambulatory Visit (HOSPITAL_COMMUNITY): Payer: Self-pay

## 2021-02-03 NOTE — Progress Notes (Signed)
Came out today for med rec, my partner tried to go see him yesterday but he was not at home- His amio is being decreased to daily and his lasix is being increased to 80mg  daily.  Those med changes were made today. Filled up pill box for this week.  Will f/u next week to see if his weight is down.    [name], EMT-Paramedic  02/03/21

## 2021-02-04 ENCOUNTER — Encounter: Payer: Self-pay | Admitting: Cardiology

## 2021-02-04 ENCOUNTER — Ambulatory Visit (INDEPENDENT_AMBULATORY_CARE_PROVIDER_SITE_OTHER): Payer: Medicare Other | Admitting: Cardiology

## 2021-02-04 ENCOUNTER — Other Ambulatory Visit: Payer: Self-pay

## 2021-02-04 VITALS — BP 130/74 | HR 74 | Ht 72.0 in | Wt 247.0 lb

## 2021-02-04 DIAGNOSIS — Z01812 Encounter for preprocedural laboratory examination: Secondary | ICD-10-CM | POA: Diagnosis not present

## 2021-02-04 DIAGNOSIS — I6330 Cerebral infarction due to thrombosis of unspecified cerebral artery: Secondary | ICD-10-CM

## 2021-02-04 DIAGNOSIS — Z01818 Encounter for other preprocedural examination: Secondary | ICD-10-CM | POA: Diagnosis not present

## 2021-02-04 DIAGNOSIS — I4819 Other persistent atrial fibrillation: Secondary | ICD-10-CM | POA: Diagnosis not present

## 2021-02-04 LAB — CBC
Hematocrit: 36.1 % — ABNORMAL LOW (ref 37.5–51.0)
Hemoglobin: 11.9 g/dL — ABNORMAL LOW (ref 13.0–17.7)
MCH: 28.1 pg (ref 26.6–33.0)
MCHC: 33 g/dL (ref 31.5–35.7)
MCV: 85 fL (ref 79–97)
Platelets: 235 10*3/uL (ref 150–450)
RBC: 4.23 x10E6/uL (ref 4.14–5.80)
RDW: 13.6 % (ref 11.6–15.4)
WBC: 4.8 10*3/uL (ref 3.4–10.8)

## 2021-02-04 LAB — BASIC METABOLIC PANEL
BUN/Creatinine Ratio: 16 (ref 9–20)
BUN: 37 mg/dL — ABNORMAL HIGH (ref 6–24)
CO2: 25 mmol/L (ref 20–29)
Calcium: 9.5 mg/dL (ref 8.7–10.2)
Chloride: 106 mmol/L (ref 96–106)
Creatinine, Ser: 2.35 mg/dL — ABNORMAL HIGH (ref 0.76–1.27)
Glucose: 32 mg/dL — CL (ref 70–99)
Potassium: 4.5 mmol/L (ref 3.5–5.2)
Sodium: 139 mmol/L (ref 134–144)
eGFR: 33 mL/min/{1.73_m2} — ABNORMAL LOW (ref >59)

## 2021-02-04 MED ORDER — METOPROLOL TARTRATE 100 MG PO TABS: ORAL_TABLET | ORAL | 0 refills | Status: DC

## 2021-02-04 NOTE — Patient Instructions (Signed)
Medication Instructions:  Your physician recommends that you continue on your current medications as directed. Please refer to the Current Medication list given to you today.  *If you need a refill on your cardiac medications before your next appointment, please call your pharmacy*   Lab Work: Pre procedure labs today:  BMP & CBC  If you have labs (blood work) drawn today and your tests are completely normal, you will receive your results only by: [place] (if you have MyChart) OR A paper copy in the mail If you have any lab test that is abnormal or we need to change your treatment, we will call you to review the results.   Testing/Procedures: Your physician has requested that you have cardiac CT within 7 days PRIOR to your ablation. Cardiac computed tomography (CT) is a painless test that uses an x-ray machine to take clear, detailed pictures of your heart.  Please follow instruction below located under "other instructions". You will get a call from our office to schedule the date for this test.  Your physician has recommended that you have an ablation. Catheter ablation is a medical procedure used to treat some cardiac arrhythmias (irregular heartbeats). During catheter ablation, a long, thin, flexible tube is put into a blood vessel in your groin (upper thigh), or neck. This tube is called an ablation catheter. It is then guided to your heart through the blood vessel. Radio frequency waves destroy small areas of heart tissue where abnormal heartbeats may cause an arrhythmia to start. Please follow instruction below located under "other instructions".   Follow-Up: At [facility], you and your health needs are our priority.  As part of our continuing mission to provide you with exceptional heart care, we have created designated Provider Care Teams.  These Care Teams include your primary Cardiologist (physician) and Advanced Practice Providers (APPs -  Physician Assistants and Nurse  Practitioners) who all work together to provide you with the care you need, when you need it.   Your next appointment:   1 month(s) after your ablation  The format for your next appointment:   In Person  Provider:   AFib clinic   Thank you for choosing [facility]!!   Trinidad Curet, RN [phone]    Other Instructions   CT INSTRUCTIONS Your cardiac CT will be scheduled at:  [facility] [address] [place], [place] [address] [phone]  Please arrive at the [facility] main entrance of [facility] 30 minutes prior to test start time. Proceed to the [facility] Radiology Department (first floor) to check-in and test prep.   Please follow these instructions carefully (unless otherwise directed):  Hold all erectile dysfunction medications at least 3 days (72 hrs) prior to test.  On the Night Before the Test: Be sure to Drink plenty of water. Do not consume any caffeinated/decaffeinated beverages or chocolate 12 hours prior to your test. Do not take any antihistamines 12 hours prior to your test.  On the Day of the Test: Drink plenty of water until 1 hour prior to the test. Do not eat any food 4 hours prior to the test. You may take your regular medications prior to the test.  Take metoprolol (Lopressor) 100 mg two hours prior to test. HOLD Furosemide/Hydrochlorothiazide morning of the test.       After the Test: Drink plenty of water. After receiving IV contrast, you may experience a mild flushed feeling. This is normal. On occasion, you may experience  a mild rash up to 24 hours after the test. This is not dangerous. If this occurs, you can take Benadryl 25 mg and increase your fluid intake. If you experience trouble breathing, this can be serious. If it is severe call 911 IMMEDIATELY. If it is mild, please call our office. If you take any of these medications: Glipizide/Metformin, Avandament, Glucavance, please do not take 48 hours  after completing test unless otherwise instructed.   Once we have confirmed authorization from your insurance company, we will call you to set up a date and time for your test. Based on how quickly your insurance processes prior authorizations requests, please allow up to 4 weeks to be contacted for scheduling your Cardiac CT appointment. Be advised that routine Cardiac CT appointments could be scheduled as many as 8 weeks after your provider has ordered it.  For non-scheduling related questions, please contact the cardiac imaging nurse navigator should you have any questions/concerns: Marchia Bond, Cardiac Imaging Nurse Navigator Gordy Clement, Cardiac Imaging Nurse Navigator [facility] Direct Office Dial: [phone]   For scheduling needs, including cancellations and rescheduling, please call [place], [phone].       Electrophysiology/Ablation Procedure Instructions   You are scheduled for a(n)  ablation on 03/02/2022 with Dr. Allegra Lai.   1.   Pre procedure testing-             A.  LAB WORK --- On 02/04/21 for your pre procedure blood work.      On the day of your procedure 03/02/2021 you will go to [facility] [phone] [place]) at 8:30 am.  Dennis Bast will go to the main entrance A [employer]) and enter where the [employer] are.  Your driver will drop you off and you will head down the hallway to ADMITTING.  You may have one support person come in to the hospital with you.  They will be asked to wait in the waiting room. It is OK to have someone drop you off and come back when you are ready to be discharged.   3.   Do not eat or drink after midnight prior to your procedure.   4.   On the morning of your procedure do NOT take any medication. Do not miss any doses of your blood thinner prior to the morning of your procedure or your procedure will need to be rescheduled.   5.  Plan for an overnight stay but you may be discharged  after your procedure, if you use your phone frequently bring your phone charger. If you are discharged after your procedure you will need someone to drive you home and be with you for 24 hours after your procedure.   6. You will follow up with the AFIB clinic 4 weeks after your procedure.  You will follow up with Dr. Curt Bears  3 months after your procedure.  These appointments will be made for you.   7. FYI: For your safety, and to allow [place] to monitor your vital signs accurately during the surgery/procedure we request that if you have artificial nails, gel coating, SNS etc. Please have those removed prior to your surgery/procedure. Not having the nail coverings /polish removed may result in cancellation or delay of your surgery/procedure.  * If you have ANY questions please call the office [phone] and ask for Ilhan Madan RN or send me a MyChart message   * Occasionally, EP Studies and ablations can become lengthy.  Please  make your family aware of this before your procedure starts.  Average time ranges from 2-8 hours for EP studies/ablations.  Your physician will call your family after the procedure with the results.                                    Cardiac Ablation Cardiac ablation is a procedure to destroy (ablate) some heart tissue that is sending bad signals. These bad signals cause problems in heart rhythm. The heart has many areas that make these signals. If there are problems in these areas, they can make the heart beat in a way that is not normal. Destroying some tissues can help make the heart rhythm normal. Tell your doctor about: Any allergies you have. All medicines you are taking. These include vitamins, herbs, eye drops, creams, and over-the-counter medicines. Any problems you or family members have had with medicines that make you fall asleep (anesthetics). Any blood disorders you have. Any surgeries you have had. Any medical conditions you have, such as kidney  failure. Whether you are pregnant or may be pregnant. What are the risks? This is a safe procedure. But problems may occur, including: Infection. Bruising and bleeding. Bleeding into the chest. Stroke or blood clots. Damage to nearby areas of your body. Allergies to medicines or dyes. The need for a pacemaker if the normal system is damaged. Failure of the procedure to treat the problem. What happens before the procedure? Medicines Ask your doctor about: Changing or stopping your normal medicines. This is important. Taking aspirin and ibuprofen. Do not take these medicines unless your doctor tells you to take them. Taking other medicines, vitamins, herbs, and supplements. General instructions Follow instructions from your doctor about what you cannot eat or drink. Plan to have someone take you home from the hospital or clinic. If you will be going home right after the procedure, plan to have someone with you for 24 hours. Ask your doctor what steps will be taken to prevent infection. What happens during the procedure?  An IV tube will be put into one of your veins. You will be given a medicine to help you relax. The skin on your neck or groin will be numbed. A cut (incision) will be made in your neck or groin. A needle will be put through your cut and into a large vein. A tube (catheter) will be put into the needle. The tube will be moved to your heart. Dye may be put through the tube. This helps your doctor see your heart. Small devices (electrodes) on the tube will send out signals. A type of energy will be used to destroy some heart tissue. The tube will be taken out. Pressure will be held on your cut. This helps stop bleeding. A bandage will be put over your cut. The exact procedure may vary among doctors and hospitals. What happens after the procedure? You will be watched until you leave the hospital or clinic. This includes checking your heart rate, breathing rate, oxygen,  and blood pressure. Your cut will be watched for bleeding. You will need to lie still for a few hours. Do not drive for 24 hours or as long as your doctor tells you. Summary Cardiac ablation is a procedure to destroy some heart tissue. This is done to treat heart rhythm problems. Tell your doctor about any medical conditions you may have. Tell him or her about all medicines  you are taking to treat them. This is a safe procedure. But problems may occur. These include infection, bruising, bleeding, and damage to nearby areas of your body. Follow what your doctor tells you about food and drink. You may also be told to change or stop some of your medicines. After the procedure, do not drive for 24 hours or as long as your doctor tells you. This information is not intended to replace advice given to you by your health care provider. Make sure you discuss any questions you have with your health care provider. Document Revised: 02/28/2019 Document Reviewed: 02/28/2019 Elsevier Patient Education  2022 [employer].

## 2021-02-04 NOTE — Progress Notes (Signed)
Electrophysiology Office Note   Date:  02/04/2021   ID:  Troy Rush, DOB 1970-11-29, MRN [id number]  PCP:  Vevelyn Francois, NP  Cardiologist:  Aundra Dubin Primary Electrophysiologist: Meridee Score, MD    Chief Complaint: AF   History of Present Illness: Troy Rush is a 50 y.o. male who is being seen today for the evaluation of AF at the request of Ridgely, Maricela Bo, FNP. Presenting today for electrophysiology evaluation.  He has a history significant for hypertension, hyperlipidemia, diabetes, chronic systolic heart failure due to nonischemic cardiomyopathy, PE, atrial fibrillation.  He is status post [place].  On 09/10/2020 he was scheduled for CardioMEMS and was noted to be in rapid atrial fibrillation.  He had stable hemodynamics on catheterization.  He was started on amiodarone which converted him to sinus rhythm.  09/30/2020 he was admitted with an acute subcortical CVA.  Given his ICD, he was unable to get an MRI.  Neurology felt this was due to small vessel disease.  Eliquis was continued.  Today, he denies symptoms of palpitations, chest pain, shortness of breath, orthopnea, PND, lower extremity edema, claudication, dizziness, presyncope, syncope, bleeding, or neurologic sequela. The patient is tolerating medications without difficulties.  He currently feels well.  He has no chest pain or shortness of breath.  Is able to all of his daily activities without restriction.  He is overall happy with his control, but would like to get off of his amiodarone.   Past Medical History:  Diagnosis Date   AICD (automatic cardioverter/defibrillator) present 10/14/2016   Bilateral deafness    Bronchitis    CHF (congestive heart failure) ([place])    Depression    Diabetes mellitus without complication ([place])    DVT, lower extremity ([place]) 07/12/2019   Dyslipidemia    Headache 08/2019   Hypertension    ICD (implantable cardioverter-defibrillator) in place    Insomnia  08/2019   NICM (nonischemic cardiomyopathy) ([place]) 09/15/2016   Pancreatitis    Sleep apnea    Vitamin D deficiency 08/2019   Past Surgical History:  Procedure Laterality Date   BACK SURGERY     CARDIAC CATHETERIZATION     ESOPHAGOGASTRODUODENOSCOPY (EGD) WITH PROPOFOL N/A 02/02/2020   Procedure: ESOPHAGOGASTRODUODENOSCOPY (EGD) WITH PROPOFOL;  Surgeon: Arta Silence, MD;  Location: [facility];  Service: Endoscopy;  Laterality: N/A;   ICD IMPLANT  10/14/2016   ICD IMPLANT N/A 10/14/2016   Procedure: ICD Implant;  Surgeon: Deboraha Sprang, MD;  Location: [place] [facility];  Service: Cardiovascular;  Laterality: N/A;   PRESSURE SENSOR/CARDIOMEMS N/A 09/10/2020   Procedure: PRESSURE SENSOR/CARDIOMEMS;  Surgeon: Larey Dresser, MD;  Location: [place] [facility];  Service: Cardiovascular;  Laterality: N/A;   RIGHT HEART CATH N/A 05/21/2020   Procedure: RIGHT HEART CATH;  Surgeon: Larey Dresser, MD;  Location: [place] [facility];  Service: Cardiovascular;  Laterality: N/A;   WRIST SURGERY       Current Outpatient Medications  Medication Sig Dispense Refill   acetaminophen (TYLENOL) 325 MG tablet Take 2 tablets (650 mg total) by mouth every 4 (four) hours as needed for mild pain (or temp > 37.5 C (99.5 F)).     amiodarone (PACERONE) 200 MG tablet Take 1 tablet (200 mg total) by mouth 2 (two) times daily. 60 tablet 6   apixaban (ELIQUIS) 5 MG TABS tablet TAKE 1 TABLET(5 MG) BY MOUTH TWICE DAILY 60 tablet 11   atorvastatin (LIPITOR) 80 MG tablet Take  1 tablet (80 mg total) by mouth at bedtime. 30 tablet 11   carvedilol (COREG) 25 MG tablet TAKE 1 TABLET (25 MG TOTAL) BY MOUTH 2 (TWO) TIMES DAILY. 60 tablet 0   dapagliflozin propanediol (FARXIGA) 10 MG TABS tablet Take 1 tablet (10 mg total) by mouth daily before breakfast. 90 tablet 3   Evolocumab (REPATHA SURECLICK) 017 MG/ML SOAJ Inject 140 mg into the skin every 14 (fourteen) days. 2 mL 11   fenofibrate 54 MG tablet Take 54 mg by mouth  daily.     furosemide (LASIX) 20 MG tablet Take 4 tablets (80 mg total) by mouth daily. 120 tablet 6   hydrALAZINE (APRESOLINE) 100 MG tablet Take 100 mg by mouth 3 (three) times daily.     insulin glargine (LANTUS SOLOSTAR) 100 UNIT/ML Solostar Pen Inject 35 Units into the skin at bedtime. 31.5 mL 3   Insulin Pen Needle (PEN NEEDLES) 31G X 6 MM MISC 1 pen by Does not apply route daily. 100 each 11   isosorbide mononitrate (IMDUR) 60 MG 24 hr tablet Take 1 tablet (60 mg total) by mouth daily. 30 tablet 11   latanoprost (XALATAN) 0.005 % ophthalmic solution Place 1 drop into both eyes at bedtime.     meclizine (ANTIVERT) 25 MG tablet Take 1 tablet (25 mg total) by mouth 3 (three) times daily as needed for dizziness. 90 tablet 3   metoprolol tartrate (LOPRESSOR) 100 MG tablet Take 1 tablet (100 mg total) two hours prior to CT testing. 1 tablet 0   pantoprazole (PROTONIX) 40 MG tablet Take 1 tablet (40 mg total) by mouth 2 (two) times daily. 180 tablet 3   sertraline (ZOLOFT) 50 MG tablet TAKE 1 TABLET (50 MG TOTAL) BY MOUTH DAILY. 30 tablet 0   VASCEPA 1 g capsule Take 2 capsules (2 g total) by mouth 2 (two) times daily. 120 capsule 5   No current facility-administered medications for this visit.    Allergies:   Lisinopril   Social History:  The patient  reports that he has never smoked. He has never used smokeless tobacco. He reports current alcohol use of about 1.0 - 2.0 standard drink per week. He reports that he does not use drugs.   Family History:  The patient's family history includes Healthy in his father; Hypertension in his mother; Stroke in his father.    ROS:  Please see the history of present illness.   Otherwise, review of systems is positive for none.   All other systems are reviewed and negative.    PHYSICAL EXAM: VS:  BP 130/74   Pulse 74   Ht 6' (1.829 m)   Wt 247 lb (112 kg)   SpO2 96%   BMI 33.50 kg/m  , BMI Body mass index is 33.5 kg/m. GEN: Well nourished, well  developed, in no acute distress  HEENT: normal  Neck: no JVD, carotid bruits, or masses Cardiac: RRR; no murmurs, rubs, or gallops,no edema  Respiratory:  clear to auscultation bilaterally, normal work of breathing GI: soft, nontender, nondistended, + BS MS: no deformity or atrophy  Skin: warm and dry, device pocket is well healed Neuro:  Strength and sensation are intact Psych: euthymic mood, full affect  EKG:  EKG is not ordered today. Personal review of the ekg ordered 01/29/21 shows sinus rhythm, rate 60  Device interrogation is reviewed today in detail.  See PaceArt for details.   Recent Labs: 09/10/2020: Magnesium 2.6; TSH 1.957 09/29/2020: B Natriuretic Peptide 335.0  10/01/2020: ALT 34 11/11/2020: Hemoglobin 11.0; Platelets 293 01/29/2021: BUN 32; Creatinine, Ser 2.27; Potassium 4.6; Sodium 137    Lipid Panel     Component Value Date/Time   CHOL 326 (H) 11/18/2020 1341   TRIG 384 (H) 11/18/2020 1341   HDL 42 11/18/2020 1341   CHOLHDL 7.8 (H) 11/18/2020 1341   CHOLHDL 7.7 10/01/2020 0235   VLDL UNABLE TO CALCULATE IF TRIGLYCERIDE OVER 400 mg/dL 10/01/2020 0235   LDLCALC 205 (H) 11/18/2020 1341   LDLDIRECT 189.9 (H) 10/01/2020 0235     Wt Readings from Last 3 Encounters:  02/04/21 247 lb (112 kg)  01/29/21 246 lb 4 oz (111.7 kg)  01/28/21 243 lb (110.2 kg)      Other studies Reviewed: Additional studies/ records that were reviewed today include: TTE 09/30/20  Review of the above records today demonstrates:   1. Left ventricular ejection fraction, by estimation, is 35 to 40%. The  left ventricle has moderately decreased function. The left ventricle  demonstrates global hypokinesis. Left ventricular diastolic function could  not be evaluated.   2. Right ventricular systolic function is normal. The right ventricular  size is normal. There is normal pulmonary artery systolic pressure.   3. The mitral valve is normal in structure. No evidence of mitral valve   regurgitation. No evidence of mitral stenosis.   4. The aortic valve is tricuspid. Aortic valve regurgitation is not  visualized. No aortic stenosis is present.   5. The inferior vena cava is dilated in size with <50% respiratory  variability, suggesting right atrial pressure of 15 mmHg.    ASSESSMENT AND PLAN:  1.  Persistent atrial fibrillation: Currently on Eliquis 5 mg twice daily.  CHA2DS2-VASc of at least 3.  He has had more frequent episodes of atrial fibrillation.  He is currently on amiodarone 200 mg daily.  He would like to get off of his amiodarone.  Due to that, we Trysten Bernard plan for ablation.  Risk, benefits, and alternatives to EP study and radiofrequency ablation for afib were also discussed in detail today. These risks include but are not limited to stroke, bleeding, vascular damage, tamponade, perforation, damage to the esophagus, lungs, and other structures, pulmonary vein stenosis, worsening renal function, and death. The patient understands these risk and wishes to proceed.  We Aleyah Balik therefore proceed with catheter ablation at the next available time.  Carto, ICE, anesthesia are requested for the procedure.  Sulayman Manning also obtain CT PV protocol prior to the procedure to exclude LAA thrombus and further evaluate atrial anatomy.   2.  Chronic systolic heart failure due to nonischemic cardiomyopathy: Currently on optimal medical therapy per primary cardiology.  Status post [place].  Device functioning appropriately.  No changes.  3.  Hypertension: Currently well controlled  Case discussed with primary cardiology  Current medicines are reviewed at length with the patient today.   The patient does not have concerns regarding his medicines.  The following changes were made today:  none  Labs/ tests ordered today include:  Orders Placed This Encounter  Procedures   CT CARDIAC MORPH/PULM VEIN W/CM&W/O CA SCORE   Basic metabolic panel   CBC      Disposition:   FU with  Davit Vassar 3 months  Signed, Delorise Hunkele Meredith Leeds, MD  02/04/2021 2:46 PM     [place] [address] [place] [address] [phone] (office) [phone] (fax)

## 2021-02-04 NOTE — H&P (View-Only) (Signed)
Electrophysiology Office Note   Date:  02/04/2021   ID:  Troy Rush, DOB [birth year], MRN [id number]  PCP:  Troy Francois, NP  Cardiologist:  Troy Rush Primary Electrophysiologist: Troy Score, MD    Chief Complaint: AF   History of Present Illness: Troy Rush is a 50 y.o. male who is being seen today for the evaluation of AF at the request of Troy Rush, Troy Bo, FNP. Presenting today for electrophysiology evaluation.  He has a history significant for hypertension, hyperlipidemia, diabetes, chronic systolic heart failure due to nonischemic cardiomyopathy, PE, atrial fibrillation.  He is status post [place].  On 09/10/2020 he was scheduled for CardioMEMS and was noted to be in rapid atrial fibrillation.  He had stable hemodynamics on catheterization.  He was started on amiodarone which converted him to sinus rhythm.  09/30/2020 he was admitted with an acute subcortical CVA.  Given his ICD, he was unable to get an MRI.  Neurology felt this was due to small vessel disease.  Eliquis was continued.  Today, he denies symptoms of palpitations, chest pain, shortness of breath, orthopnea, PND, lower extremity edema, claudication, dizziness, presyncope, syncope, bleeding, or neurologic sequela. The patient is tolerating medications without difficulties.  He currently feels well.  He has no chest pain or shortness of breath.  Is able to all of his daily activities without restriction.  He is overall happy with his control, but would like to get off of his amiodarone.   Past Medical History:  Diagnosis Date   AICD (automatic cardioverter/defibrillator) present 10/14/2016   Bilateral deafness    Bronchitis    CHF (congestive heart failure) ([place])    Depression    Diabetes mellitus without complication ([place])    DVT, lower extremity ([place]) 07/12/2019   Dyslipidemia    Headache 08/2019   Hypertension    ICD (implantable cardioverter-defibrillator) in place    Insomnia  08/2019   NICM (nonischemic cardiomyopathy) ([place]) 09/15/2016   Pancreatitis    Sleep apnea    Vitamin D deficiency 08/2019   Past Surgical History:  Procedure Laterality Date   BACK SURGERY     CARDIAC CATHETERIZATION     ESOPHAGOGASTRODUODENOSCOPY (EGD) WITH PROPOFOL N/A 02/02/2020   Procedure: ESOPHAGOGASTRODUODENOSCOPY (EGD) WITH PROPOFOL;  Surgeon: Arta Silence, MD;  Location: [facility];  Service: Endoscopy;  Laterality: N/A;   ICD IMPLANT  10/14/2016   ICD IMPLANT N/A 10/14/2016   Procedure: ICD Implant;  Surgeon: Deboraha Sprang, MD;  Location: [facility];  Service: Cardiovascular;  Laterality: N/A;   PRESSURE SENSOR/CARDIOMEMS N/A 09/10/2020   Procedure: PRESSURE SENSOR/CARDIOMEMS;  Surgeon: Larey Dresser, MD;  Location: [facility];  Service: Cardiovascular;  Laterality: N/A;   RIGHT HEART CATH N/A 05/21/2020   Procedure: RIGHT HEART CATH;  Surgeon: Larey Dresser, MD;  Location: [facility];  Service: Cardiovascular;  Laterality: N/A;   WRIST SURGERY       Current Outpatient Medications  Medication Sig Dispense Refill   acetaminophen (TYLENOL) 325 MG tablet Take 2 tablets (650 mg total) by mouth every 4 (four) hours as needed for mild pain (or temp > 37.5 C (99.5 F)).     amiodarone (PACERONE) 200 MG tablet Take 1 tablet (200 mg total) by mouth 2 (two) times daily. 60 tablet 6   apixaban (ELIQUIS) 5 MG TABS tablet TAKE 1 TABLET(5 MG) BY MOUTH TWICE DAILY 60 tablet 11   atorvastatin (LIPITOR) 80 MG tablet Take  1 tablet (80 mg total) by mouth at bedtime. 30 tablet 11   carvedilol (COREG) 25 MG tablet TAKE 1 TABLET (25 MG TOTAL) BY MOUTH 2 (TWO) TIMES DAILY. 60 tablet 0   dapagliflozin propanediol (FARXIGA) 10 MG TABS tablet Take 1 tablet (10 mg total) by mouth daily before breakfast. 90 tablet 3   Evolocumab (REPATHA SURECLICK) 638 MG/ML SOAJ Inject 140 mg into the skin every 14 (fourteen) days. 2 mL 11   fenofibrate 54 MG tablet Take 54 mg by mouth  daily.     furosemide (LASIX) 20 MG tablet Take 4 tablets (80 mg total) by mouth daily. 120 tablet 6   hydrALAZINE (APRESOLINE) 100 MG tablet Take 100 mg by mouth 3 (three) times daily.     insulin glargine (LANTUS SOLOSTAR) 100 UNIT/ML Solostar Pen Inject 35 Units into the skin at bedtime. 31.5 mL 3   Insulin Pen Needle (PEN NEEDLES) 31G X 6 MM MISC 1 pen by Does not apply route daily. 100 each 11   isosorbide mononitrate (IMDUR) 60 MG 24 hr tablet Take 1 tablet (60 mg total) by mouth daily. 30 tablet 11   latanoprost (XALATAN) 0.005 % ophthalmic solution Place 1 drop into both eyes at bedtime.     meclizine (ANTIVERT) 25 MG tablet Take 1 tablet (25 mg total) by mouth 3 (three) times daily as needed for dizziness. 90 tablet 3   metoprolol tartrate (LOPRESSOR) 100 MG tablet Take 1 tablet (100 mg total) two hours prior to CT testing. 1 tablet 0   pantoprazole (PROTONIX) 40 MG tablet Take 1 tablet (40 mg total) by mouth 2 (two) times daily. 180 tablet 3   sertraline (ZOLOFT) 50 MG tablet TAKE 1 TABLET (50 MG TOTAL) BY MOUTH DAILY. 30 tablet 0   VASCEPA 1 g capsule Take 2 capsules (2 g total) by mouth 2 (two) times daily. 120 capsule 5   No current facility-administered medications for this visit.    Allergies:   Lisinopril   Social History:  The patient  reports that he has never smoked. He has never used smokeless tobacco. He reports current alcohol use of about 1.0 - 2.0 standard drink per week. He reports that he does not use drugs.   Family History:  The patient's family history includes Healthy in his father; Hypertension in his mother; Stroke in his father.    ROS:  Please see the history of present illness.   Otherwise, review of systems is positive for none.   All other systems are reviewed and negative.    PHYSICAL EXAM: VS:  BP 130/74   Pulse 74   Ht 6' (1.829 m)   Wt 247 lb (112 kg)   SpO2 96%   BMI 33.50 kg/m  , BMI Body mass index is 33.5 kg/m. GEN: Well nourished, well  developed, in no acute distress  HEENT: normal  Neck: no JVD, carotid bruits, or masses Cardiac: RRR; no murmurs, rubs, or gallops,no edema  Respiratory:  clear to auscultation bilaterally, normal work of breathing GI: soft, nontender, nondistended, + BS MS: no deformity or atrophy  Skin: warm and dry, device pocket is well healed Neuro:  Strength and sensation are intact Psych: euthymic mood, full affect  EKG:  EKG is not ordered today. Personal review of the ekg ordered 01/29/21 shows sinus rhythm, rate 60  Device interrogation is reviewed today in detail.  See PaceArt for details.   Recent Labs: 09/10/2020: Magnesium 2.6; TSH 1.957 09/29/2020: B Natriuretic Peptide 335.0  10/01/2020: ALT 34 11/11/2020: Hemoglobin 11.0; Platelets 293 01/29/2021: BUN 32; Creatinine, Ser 2.27; Potassium 4.6; Sodium 137    Lipid Panel     Component Value Date/Time   CHOL 326 (H) 11/18/2020 1341   TRIG 384 (H) 11/18/2020 1341   HDL 42 11/18/2020 1341   CHOLHDL 7.8 (H) 11/18/2020 1341   CHOLHDL 7.7 10/01/2020 0235   VLDL UNABLE TO CALCULATE IF TRIGLYCERIDE OVER 400 mg/dL 10/01/2020 0235   LDLCALC 205 (H) 11/18/2020 1341   LDLDIRECT 189.9 (H) 10/01/2020 0235     Wt Readings from Last 3 Encounters:  02/04/21 247 lb (112 kg)  01/29/21 246 lb 4 oz (111.7 kg)  01/28/21 243 lb (110.2 kg)      Other studies Reviewed: Additional studies/ records that were reviewed today include: TTE 09/30/20  Review of the above records today demonstrates:   1. Left ventricular ejection fraction, by estimation, is 35 to 40%. The  left ventricle has moderately decreased function. The left ventricle  demonstrates global hypokinesis. Left ventricular diastolic function could  not be evaluated.   2. Right ventricular systolic function is normal. The right ventricular  size is normal. There is normal pulmonary artery systolic pressure.   3. The mitral valve is normal in structure. No evidence of mitral valve   regurgitation. No evidence of mitral stenosis.   4. The aortic valve is tricuspid. Aortic valve regurgitation is not  visualized. No aortic stenosis is present.   5. The inferior vena cava is dilated in size with <50% respiratory  variability, suggesting right atrial pressure of 15 mmHg.    ASSESSMENT AND PLAN:  1.  Persistent atrial fibrillation: Currently on Eliquis 5 mg twice daily.  CHA2DS2-VASc of at least 3.  He has had more frequent episodes of atrial fibrillation.  He is currently on amiodarone 200 mg daily.  He would like to get off of his amiodarone.  Due to that, we Randilyn Foisy plan for ablation.  Risk, benefits, and alternatives to EP study and radiofrequency ablation for afib were also discussed in detail today. These risks include but are not limited to stroke, bleeding, vascular damage, tamponade, perforation, damage to the esophagus, lungs, and other structures, pulmonary vein stenosis, worsening renal function, and death. The patient understands these risk and wishes to proceed.  We Drayven Marchena therefore proceed with catheter ablation at the next available time.  Carto, ICE, anesthesia are requested for the procedure.  Carrera Kiesel also obtain CT PV protocol prior to the procedure to exclude LAA thrombus and further evaluate atrial anatomy.   2.  Chronic systolic heart failure due to nonischemic cardiomyopathy: Currently on optimal medical therapy per primary cardiology.  Status post [place].  Device functioning appropriately.  No changes.  3.  Hypertension: Currently well controlled  Case discussed with primary cardiology  Current medicines are reviewed at length with the patient today.   The patient does not have concerns regarding his medicines.  The following changes were made today:  none  Labs/ tests ordered today include:  Orders Placed This Encounter  Procedures   CT CARDIAC MORPH/PULM VEIN W/CM&W/O CA Rush   Basic metabolic panel   CBC      Disposition:   FU with  Miyonna [place] 3 months  Signed, Hero Mccathern Meredith Leeds, MD  02/04/2021 2:46 PM     [facility] [address] [place] [address] [phone] (office) [phone] (fax)

## 2021-02-10 ENCOUNTER — Other Ambulatory Visit (HOSPITAL_COMMUNITY): Payer: Self-pay

## 2021-02-10 NOTE — Progress Notes (Signed)
Paramedicine Encounter    Patient ID: Troy Rush, male    DOB: 1970-09-29, 50 y.o.   MRN: [id number]   Patient Care Team: Vevelyn Francois, NP as PCP - General (Adult Health Nurse Practitioner) Larey Dresser, MD as PCP - Cardiology (Cardiology) Conrad , NP as Nurse Practitioner (Cardiology) Larey Dresser, MD as Consulting Physician (Cardiology) Jorge Ny, LCSW as Social Worker (Licensed Clinical Social Worker)  Patient Active Problem List   Diagnosis Date Noted  . Subcortical infarction ([place]) 10/03/2020  . Cerebral thrombosis with cerebral infarction 10/01/2020  . Unilateral weakness 09/30/2020  . History of atrial fibrillation 09/30/2020  . History of DVT (deep vein thrombosis) 09/30/2020  . CKD (chronic kidney disease) stage 3, GFR 30-59 ml/min (HCC) 07/08/2020  . Acute CHF (congestive heart failure) ([place]) 07/07/2020  . CKD (chronic kidney disease), stage IV ([place]) 07/07/2020  . ICD (implantable cardioverter-defibrillator) in place 05/14/2020  . Pulmonary embolism ([place]) 02/05/2020  . Acute kidney injury superimposed on CKD ([place])   . Chronic combined systolic and diastolic heart failure ([place])   . Atrial fibrillation with RVR ([place]) 02/01/2020  . NSVT (nonsustained ventricular tachycardia) 02/01/2020  . Acute GI bleeding 02/01/2020  . DKA (diabetic ketoacidosis) ([place]) 02/01/2020  . Bilateral deafness 08/15/2019  . Type 2 diabetes mellitus with complication, with long-term current use of insulin ([place]) 08/15/2019  . Hemoglobin A1C between 7% and 9% indicating borderline diabetic control ([place]) 08/15/2019  . Insomnia 08/15/2019  . Abdominal pain 11/14/2018  . Ischemic cardiomyopathy 10/14/2016  . NICM (nonischemic cardiomyopathy) ([place]) 09/15/2016  . Chronic systolic CHF (congestive heart failure) ([place]) 05/30/2016  . HTN (hypertension) 05/30/2016  . Deaf 05/30/2016  . Hyperkalemia 05/30/2016  . Major depressive disorder, recurrent episode ([place]) 03/21/2016  .  Bronchitis 01/21/2015  . Dyslipidemia 01/21/2015  . Vitamin D deficiency 12/19/2011  . Personal history of other infectious and parasitic diseases 10/25/2011  . Anxiety disorder 05/12/2011  . Hyperlipidemia 05/12/2011  . Hypertriglyceridemia 05/12/2011  . Obstructive sleep apnea 05/12/2011    Current Outpatient Medications:  .  acetaminophen (TYLENOL) 325 MG tablet, Take 2 tablets (650 mg total) by mouth every 4 (four) hours as needed for mild pain (or temp > 37.5 C (99.5 F))., Disp: , Rfl:  .  amiodarone (PACERONE) 200 MG tablet, Take 1 tablet (200 mg total) by mouth 2 (two) times daily. (Patient taking differently: Take 200 mg by mouth 2 (two) times daily.), Disp: 60 tablet, Rfl: 6 .  apixaban (ELIQUIS) 5 MG TABS tablet, TAKE 1 TABLET(5 MG) BY MOUTH TWICE DAILY, Disp: 60 tablet, Rfl: 11 .  atorvastatin (LIPITOR) 80 MG tablet, Take 1 tablet (80 mg total) by mouth at bedtime., Disp: 30 tablet, Rfl: 11 .  carvedilol (COREG) 25 MG tablet, TAKE 1 TABLET (25 MG TOTAL) BY MOUTH 2 (TWO) TIMES DAILY., Disp: 60 tablet, Rfl: 0 .  dapagliflozin propanediol (FARXIGA) 10 MG TABS tablet, Take 1 tablet (10 mg total) by mouth daily before breakfast., Disp: 90 tablet, Rfl: 3 .  Evolocumab (REPATHA SURECLICK) 825 MG/ML SOAJ, Inject 140 mg into the skin every 14 (fourteen) days., Disp: 2 mL, Rfl: 11 .  fenofibrate 54 MG tablet, Take 54 mg by mouth daily., Disp: , Rfl:  .  furosemide (LASIX) 20 MG tablet, Take 4 tablets (80 mg total) by mouth daily., Disp: 120 tablet, Rfl: 6 .  hydrALAZINE (APRESOLINE) 100 MG tablet, Take 100 mg by mouth 3 (three) times daily., Disp: , Rfl:  .  insulin glargine (LANTUS SOLOSTAR) 100 UNIT/ML Solostar Pen, Inject 35 Units into the skin at bedtime., Disp: 31.5 mL, Rfl: 3 .  Insulin Pen Needle (PEN NEEDLES) 31G X 6 MM MISC, 1 pen by Does not apply route daily., Disp: 100 each, Rfl: 11 .  isosorbide mononitrate (IMDUR) 60 MG 24 hr tablet, Take 1 tablet (60 mg total) by mouth daily.,  Disp: 30 tablet, Rfl: 11 .  latanoprost (XALATAN) 0.005 % ophthalmic solution, Place 1 drop into both eyes at bedtime., Disp: , Rfl:  .  meclizine (ANTIVERT) 25 MG tablet, Take 1 tablet (25 mg total) by mouth 3 (three) times daily as needed for dizziness., Disp: 90 tablet, Rfl: 3 .  pantoprazole (PROTONIX) 40 MG tablet, Take 1 tablet (40 mg total) by mouth 2 (two) times daily., Disp: 180 tablet, Rfl: 3 .  sertraline (ZOLOFT) 50 MG tablet, TAKE 1 TABLET (50 MG TOTAL) BY MOUTH DAILY., Disp: 30 tablet, Rfl: 0 .  VASCEPA 1 g capsule, Take 2 capsules (2 g total) by mouth 2 (two) times daily., Disp: 120 capsule, Rfl: 5 .  metoprolol tartrate (LOPRESSOR) 100 MG tablet, Take 1 tablet (100 mg total) two hours prior to CT testing. (Patient not taking: Reported on 02/10/2021), Disp: 1 tablet, Rfl: 0 Allergies  Allergen Reactions  . Lisinopril Cough      Social History   Socioeconomic History  . Marital status: Legally Separated    Spouse name: Not on file  . Number of children: Not on file  . Years of education: Not on file  . Highest education level: Not on file  Occupational History  . Not on file  Tobacco Use  . Smoking status: Never  . Smokeless tobacco: Never  Vaping Use  . Vaping Use: Never used  Substance and Sexual Activity  . Alcohol use: Yes    Alcohol/week: 1.0 - 2.0 standard drink    Types: 1 - 2 Glasses of wine per week    Comment: rare  . Drug use: No  . Sexual activity: Yes    Birth control/protection: None  Other Topics Concern  . Not on file  Social History Narrative  . Not on file   Social Determinants of Health   Financial Resource Strain: Medium Risk  . Difficulty of Paying Living Expenses: Somewhat hard  Food Insecurity: No Food Insecurity  . Worried About Charity fundraiser in the Last Year: Never true  . Ran Out of Food in the Last Year: Never true  Transportation Needs: No Transportation Needs  . Lack of Transportation (Medical): No  . Lack of  Transportation (Non-Medical): No  Physical Activity: Not on file  Stress: Not on file  Social Connections: Not on file  Intimate Partner Violence: Not on file    Physical Exam      Future Appointments  Date Time Provider [place]  02/23/2021  8:00 AM [facility]-CT 2 [facility]  02/23/2021  9:15 AM [facility]  03/22/2021  8:35 AM [facility] DEVICE REMOTES [facility]  04/09/2021 11:40 AM Larey Dresser, MD [facility] None  04/19/2021 11:00 AM Vevelyn Francois, NP [facility] None  05/27/2021  8:45 AM Frann Rider, NP [facility] None  06/21/2021  8:35 AM [facility] DEVICE REMOTES [facility]  09/20/2021  8:35 AM [facility] DEVICE REMOTES [facility]  12/20/2021  8:35 AM [facility] DEVICE REMOTES [facility]    BP (!) 164/98   Pulse 69   Resp 18   Wt 240 lb (108.9  kg)   SpO2 98%   BMI 32.55 kg/m   Weight yesterday-240 Last visit weight-240   Pt reports feeling ok, weight hanging out at 240 with the increased dose of lasix.  No dizziness, no c/p. He does get sob at times. Still no CPAP yet.  He is sch for ablation on 11/22 He is suppose to get a CT done 7 days prior to the ablation.  He is to take metoprolol the morning of his CT, no carvedilol that morn.  He is going to sch the txp.  He also has a letter to CKD to  Meds verified and pill box refilled for one week.  The following week I will place those med changes.  He is short a few days on meclizine.    Refills needed- Meclizine Lasix-the increased dose Hydralazine Sertraline   [name], [place] Paramedic  02/10/21

## 2021-02-17 ENCOUNTER — Other Ambulatory Visit (HOSPITAL_COMMUNITY): Payer: Self-pay

## 2021-02-17 ENCOUNTER — Telehealth (HOSPITAL_COMMUNITY): Payer: Self-pay | Admitting: Adult Health

## 2021-02-17 MED ORDER — METOLAZONE 2.5 MG PO TABS: 2.5000 mg | ORAL_TABLET | ORAL | 3 refills | Status: DC

## 2021-02-17 MED ORDER — FUROSEMIDE 80 MG PO TABS: 80.0000 mg | ORAL_TABLET | Freq: Two times a day (BID) | ORAL | 6 refills | Status: DC

## 2021-02-17 NOTE — Telephone Encounter (Signed)
Cardiomems Reading   30  today. Elevated.   Denies SOB. SBP 200/100. 2 + edema.   Increase lasix 80 mg twice a day.  Add 2.5 metolazone today.    Follow readings closely. Discussed changes with HF paramedicine Katie.

## 2021-02-17 NOTE — Progress Notes (Signed)
Paramedicine Encounter    Patient ID: Troy Rush, male    DOB: 1970/09/01, 50 y.o.   MRN: [id number]   Patient Care Team: Vevelyn Francois, NP as PCP - General (Adult Health Nurse Practitioner) Larey Dresser, MD as PCP - Cardiology (Cardiology) Conrad Hingham, NP as Nurse Practitioner (Cardiology) Larey Dresser, MD as Consulting Physician (Cardiology) Jorge Ny, LCSW as Social Worker (Licensed Clinical Social Worker)  Patient Active Problem List   Diagnosis Date Noted   Subcortical infarction [facility]) 10/03/2020   Cerebral thrombosis with cerebral infarction 10/01/2020   Unilateral weakness 09/30/2020   History of atrial fibrillation 09/30/2020   History of DVT (deep vein thrombosis) 09/30/2020   CKD (chronic kidney disease) stage 3, GFR 30-59 ml/min ([place]) 07/08/2020   Acute CHF (congestive heart failure) ([place]) 07/07/2020   CKD (chronic kidney disease), stage IV ([place]) 07/07/2020   ICD (implantable cardioverter-defibrillator) in place 05/14/2020   Pulmonary embolism ([place]) 02/05/2020   Acute kidney injury superimposed on CKD ([place])    Chronic combined systolic and diastolic heart failure ([place])    Atrial fibrillation with RVR ([place]) 02/01/2020   NSVT (nonsustained ventricular tachycardia) 02/01/2020   Acute GI bleeding 02/01/2020   DKA (diabetic ketoacidosis) ([place]) 02/01/2020   Bilateral deafness 08/15/2019   Type 2 diabetes mellitus with complication, with long-term current use of insulin ([place]) 08/15/2019   Hemoglobin A1C between 7% and 9% indicating borderline diabetic control ([place]) 08/15/2019   Insomnia 08/15/2019   Abdominal pain 11/14/2018   Ischemic cardiomyopathy 10/14/2016   NICM (nonischemic cardiomyopathy) ([place]) 63/89/3734   Chronic systolic CHF (congestive heart failure) ([place]) 05/30/2016   HTN (hypertension) 05/30/2016   Deaf 05/30/2016   Hyperkalemia 05/30/2016   Major depressive disorder, recurrent episode ([place]) 03/21/2016   Bronchitis 01/21/2015    Dyslipidemia 01/21/2015   Vitamin D deficiency 12/19/2011   Personal history of other infectious and parasitic diseases 10/25/2011   Anxiety disorder 05/12/2011   Hyperlipidemia 05/12/2011   Hypertriglyceridemia 05/12/2011   Obstructive sleep apnea 05/12/2011    Current Outpatient Medications:    acetaminophen (TYLENOL) 325 MG tablet, Take 2 tablets (650 mg total) by mouth every 4 (four) hours as needed for mild pain (or temp > 37.5 C (99.5 F))., Disp: , Rfl:    amiodarone (PACERONE) 200 MG tablet, Take 1 tablet (200 mg total) by mouth 2 (two) times daily. (Patient taking differently: Take 200 mg by mouth 2 (two) times daily.), Disp: 60 tablet, Rfl: 6   apixaban (ELIQUIS) 5 MG TABS tablet, TAKE 1 TABLET(5 MG) BY MOUTH TWICE DAILY, Disp: 60 tablet, Rfl: 11   atorvastatin (LIPITOR) 80 MG tablet, Take 1 tablet (80 mg total) by mouth at bedtime., Disp: 30 tablet, Rfl: 11   dapagliflozin propanediol (FARXIGA) 10 MG TABS tablet, Take 1 tablet (10 mg total) by mouth daily before breakfast., Disp: 90 tablet, Rfl: 3   Evolocumab (REPATHA SURECLICK) 287 MG/ML SOAJ, Inject 140 mg into the skin every 14 (fourteen) days., Disp: 2 mL, Rfl: 11   fenofibrate 54 MG tablet, Take 54 mg by mouth daily., Disp: , Rfl:    furosemide (LASIX) 20 MG tablet, Take 4 tablets (80 mg total) by mouth daily., Disp: 120 tablet, Rfl: 6   hydrALAZINE (APRESOLINE) 100 MG tablet, Take 100 mg by mouth 3 (three) times daily., Disp: , Rfl:    carvedilol (COREG) 25 MG tablet, TAKE 1 TABLET (25 MG TOTAL) BY MOUTH 2 (TWO) TIMES DAILY., Disp: 60 tablet, Rfl: 0  insulin glargine (LANTUS SOLOSTAR) 100 UNIT/ML Solostar Pen, Inject 35 Units into the skin at bedtime., Disp: 31.5 mL, Rfl: 3   Insulin Pen Needle (PEN NEEDLES) 31G X 6 MM MISC, 1 pen by Does not apply route daily., Disp: 100 each, Rfl: 11   isosorbide mononitrate (IMDUR) 60 MG 24 hr tablet, Take 1 tablet (60 mg total) by mouth daily., Disp: 30 tablet, Rfl: 11   latanoprost  (XALATAN) 0.005 % ophthalmic solution, Place 1 drop into both eyes at bedtime., Disp: , Rfl:    meclizine (ANTIVERT) 25 MG tablet, Take 1 tablet (25 mg total) by mouth 3 (three) times daily as needed for dizziness., Disp: 90 tablet, Rfl: 3   metoprolol tartrate (LOPRESSOR) 100 MG tablet, Take 1 tablet (100 mg total) two hours prior to CT testing. (Patient not taking: Reported on 02/10/2021), Disp: 1 tablet, Rfl: 0   pantoprazole (PROTONIX) 40 MG tablet, Take 1 tablet (40 mg total) by mouth 2 (two) times daily., Disp: 180 tablet, Rfl: 3   sertraline (ZOLOFT) 50 MG tablet, TAKE 1 TABLET (50 MG TOTAL) BY MOUTH DAILY., Disp: 30 tablet, Rfl: 0   VASCEPA 1 g capsule, Take 2 capsules (2 g total) by mouth 2 (two) times daily., Disp: 120 capsule, Rfl: 5 Allergies  Allergen Reactions   Lisinopril Cough      Social History   Socioeconomic History   Marital status: Legally Separated    Spouse name: Not on file   Number of children: Not on file   Years of education: Not on file   Highest education level: Not on file  Occupational History   Not on file  Tobacco Use   Smoking status: Never   Smokeless tobacco: Never  Vaping Use   Vaping Use: Never used  Substance and Sexual Activity   Alcohol use: Yes    Alcohol/week: 1.0 - 2.0 standard drink    Types: 1 - 2 Glasses of wine per week    Comment: rare   Drug use: No   Sexual activity: Yes    Birth control/protection: None  Other Topics Concern   Not on file  Social History Narrative   Not on file   Social Determinants of Health   Financial Resource Strain: Medium Risk   Difficulty of Paying Living Expenses: Somewhat hard  Food Insecurity: No Food Insecurity   Worried About Charity fundraiser in the Last Year: Never true   Ran Out of Food in the Last Year: Never true  Transportation Needs: Not on file  Physical Activity: Not on file  Stress: Not on file  Social Connections: Not on file  Intimate Partner Violence: Not on file     Physical Exam      Future Appointments  Date Time Provider [place]  02/23/2021  8:00 AM MC-CT 2 MC-CT [facility]  02/23/2021  9:15 AM [facility]  03/22/2021  8:35 AM [facility] DEVICE REMOTES [facility]  04/09/2021 11:40 AM Larey Dresser, MD [facility] None  04/19/2021 11:00 AM Vevelyn Francois, NP [facility] None  05/27/2021  8:45 AM Frann Rider, NP GNA-[facility] None  06/21/2021  8:35 AM [facility] DEVICE REMOTES [facility]  09/20/2021  8:35 AM [facility] DEVICE REMOTES [facility]  12/20/2021  8:35 AM [facility] DEVICE REMOTES [facility]    BP (!) 200/118   Pulse 78   Resp 18   Wt 241 lb (109.3 kg)   SpO2 98%   BMI 32.69 kg/m  B/p recheck-182/112 B/p  recheck 77min later- 160/90  Weight yesterday-? Last visit weight-240   Pt reports feeling ok, he denies increased sob, denies dizziness, no c/p.  B/p up-took meds an hour ago. He did take meds last night.  He does have +2edema to shins.  B/p slowly coming down, rechecked it for 3rd time about an hour later.  Lasix increased to 80mg  BID UFN.   Adding metolazone 2.5 for today.    Meds verified and pill box refilled.   Refills needed-repatha    [name], [place] Paramedic  02/17/21

## 2021-02-18 ENCOUNTER — Telehealth (HOSPITAL_COMMUNITY): Payer: Self-pay

## 2021-02-18 NOTE — Telephone Encounter (Signed)
Contacted pt today for f/u after metolazone dose yesterday-he reports weight was 242 but he had on many layers of clothing due to weather.  I advised him that is not an accurate weight.  He said he did his cardiomems reading but it said it was disconnected.  Asked him to redo it.  Also asked him to check his b/p as well, he said to day was his birthday and he didn't want to be stressing today.  He said he had good urine output after metolazone yesterday.  Will f/u early next week.   I had sent message to dr Curt Bears about the carvedilol and metoprolol use on day of CT scan. If I dont hear back by Monday I will contact that office and speak to staff just to verify the med use.    [name], EMT-Paramedic  02/18/21

## 2021-02-22 ENCOUNTER — Telehealth (HOSPITAL_COMMUNITY): Payer: Self-pay

## 2021-02-22 ENCOUNTER — Telehealth: Payer: Self-pay | Admitting: Cardiology

## 2021-02-22 ENCOUNTER — Telehealth (HOSPITAL_COMMUNITY): Payer: Self-pay | Admitting: Emergency Medicine

## 2021-02-22 DIAGNOSIS — N184 Chronic kidney disease, stage 4 (severe): Secondary | ICD-10-CM | POA: Diagnosis not present

## 2021-02-22 NOTE — Telephone Encounter (Signed)
Interpeter [phone] purple relay interpreting service used to review CCTA instructions   Reaching out to patient to offer assistance regarding upcoming cardiac imaging study; pt verbalizes understanding of appt date/time, parking situation and where to check in, pre-test NPO status and medications ordered, and verified current allergies; name and call back number provided for further questions should they arise Marchia Bond RN Navigator Cardiac Imaging [facility] [phone] office [phone] cell  6:00a - 100mg  metoprolol tart   6:30a - admitting check in 7:00a- IVF in short stay  8:00a - pulm vein CTA 8:20a - IVF in infusion clinic  Pt does report some scar tissue to the veins in his arms from prior needle sticks

## 2021-02-22 NOTE — Telephone Encounter (Signed)
Called earlier, left message informing pt to hold his Lasix tomorrow morning.  Called just now and advised to hold Coreg, take Lopressor tomorrow morning instead for the CT testing.

## 2021-02-22 NOTE — Telephone Encounter (Signed)
Pt c/o medication issue:  1. Name of Medication:  metoprolol tartrate (LOPRESSOR) 100 MG tablet carvedilol (COREG) 25 MG tablet  2. How are you currently taking this medication (dosage and times per day)?   3. Are you having a reaction (difficulty breathing--STAT)?   4. What is your medication issue?   Katie, EMT is following up regarding instructions for Carvedilol and Metoprolol (see 11/10 phone encounter). CT is scheduled for 11/15. Please advise ASAP.

## 2021-02-22 NOTE — Telephone Encounter (Signed)
[place] to refill the following:   Meclizine Lasix-the increased dose Hydralazine Sertraline Repatha    [name], EMT-Paramedic  02/22/21

## 2021-02-23 ENCOUNTER — Other Ambulatory Visit: Payer: Medicare Other

## 2021-02-23 ENCOUNTER — Ambulatory Visit (HOSPITAL_COMMUNITY)
Admission: RE | Admit: 2021-02-23 | Discharge: 2021-02-23 | Disposition: A | Discharge status: Home / Self Care | Payer: Medicare Other | Source: Ambulatory Visit | Attending: Cardiology | Admitting: Cardiology

## 2021-02-23 ENCOUNTER — Encounter (HOSPITAL_COMMUNITY)
Admission: RE | Admit: 2021-02-23 | Discharge: 2021-02-23 | Disposition: A | Discharge status: Home / Self Care | Payer: Medicare Other | Source: Ambulatory Visit | Attending: Cardiology | Admitting: Cardiology

## 2021-02-23 VITALS — BP 135/77 | HR 64 | Temp 98.4°F | Resp 16 | Ht 70.0 in | Wt 240.0 lb

## 2021-02-23 DIAGNOSIS — N184 Chronic kidney disease, stage 4 (severe): Secondary | ICD-10-CM | POA: Diagnosis not present

## 2021-02-23 DIAGNOSIS — I4819 Other persistent atrial fibrillation: Secondary | ICD-10-CM | POA: Insufficient documentation

## 2021-02-23 DIAGNOSIS — N183 Chronic kidney disease, stage 3 unspecified: Secondary | ICD-10-CM | POA: Diagnosis not present

## 2021-02-23 LAB — BASIC METABOLIC PANEL
Anion gap: 12 (ref 5–15)
BUN: 52 mg/dL — ABNORMAL HIGH (ref 6–20)
CO2: 29 mmol/L (ref 22–32)
Calcium: 9 mg/dL (ref 8.9–10.3)
Chloride: 96 mmol/L — ABNORMAL LOW (ref 98–111)
Creatinine, Ser: 3.53 mg/dL — ABNORMAL HIGH (ref 0.61–1.24)
GFR, Estimated: 20 mL/min — ABNORMAL LOW (ref >60)
Glucose, Bld: 62 mg/dL — ABNORMAL LOW (ref 70–99)
Potassium: 4 mmol/L (ref 3.5–5.1)
Sodium: 137 mmol/L (ref 135–145)

## 2021-02-23 MED ORDER — SODIUM CHLORIDE 0.9 % WEIGHT BASED INFUSION: 1.0000 mL/kg/h | INTRAVENOUS | Status: DC

## 2021-02-23 MED ORDER — SODIUM CHLORIDE 0.9 % WEIGHT BASED INFUSION
3.0000 mL/kg/h | INTRAVENOUS | Status: AC
  Administered 2021-02-23: 3 mL/kg/h via INTRAVENOUS

## 2021-02-23 MED ORDER — IOHEXOL 350 MG/ML SOLN
80.0000 mL | Freq: Once | INTRAVENOUS | Status: AC | PRN
  Administered 2021-02-23: 80 mL via INTRAVENOUS

## 2021-02-24 ENCOUNTER — Other Ambulatory Visit (HOSPITAL_COMMUNITY): Payer: Self-pay

## 2021-02-24 ENCOUNTER — Encounter: Payer: Self-pay | Admitting: Student-PharmD

## 2021-02-24 NOTE — Progress Notes (Signed)
Paramedicine Encounter    Patient ID: Troy Rush, male    DOB: 09-10-1970, 50 y.o.   MRN: [id number]   Patient Care Team: Vevelyn Francois, NP as PCP - General (Adult Health Nurse Practitioner) Larey Dresser, MD as PCP - Cardiology (Cardiology) Conrad Pamplico, NP as Nurse Practitioner (Cardiology) Larey Dresser, MD as Consulting Physician (Cardiology) Jorge Ny, LCSW as Social Worker (Licensed Clinical Social Worker)  Patient Active Problem List   Diagnosis Date Noted   Subcortical infarction [facility]) 10/03/2020   Cerebral thrombosis with cerebral infarction 10/01/2020   Unilateral weakness 09/30/2020   History of atrial fibrillation 09/30/2020   History of DVT (deep vein thrombosis) 09/30/2020   CKD (chronic kidney disease) stage 3, GFR 30-59 ml/min ([place]) 07/08/2020   Acute CHF (congestive heart failure) ([place]) 07/07/2020   CKD (chronic kidney disease), stage IV ([place]) 07/07/2020   ICD (implantable cardioverter-defibrillator) in place 05/14/2020   Pulmonary embolism ([place]) 02/05/2020   Acute kidney injury superimposed on CKD ([place])    Chronic combined systolic and diastolic heart failure ([place])    Atrial fibrillation with RVR ([place]) 02/01/2020   NSVT (nonsustained ventricular tachycardia) 02/01/2020   Acute GI bleeding 02/01/2020   DKA (diabetic ketoacidosis) ([place]) 02/01/2020   Bilateral deafness 08/15/2019   Type 2 diabetes mellitus with complication, with long-term current use of insulin ([place]) 08/15/2019   Hemoglobin A1C between 7% and 9% indicating borderline diabetic control ([place]) 08/15/2019   Insomnia 08/15/2019   Abdominal pain 11/14/2018   Ischemic cardiomyopathy 10/14/2016   NICM (nonischemic cardiomyopathy) ([place]) 42/59/5638   Chronic systolic CHF (congestive heart failure) ([place]) 05/30/2016   HTN (hypertension) 05/30/2016   Deaf 05/30/2016   Hyperkalemia 05/30/2016   Major depressive disorder, recurrent episode ([place]) 03/21/2016   Bronchitis 01/21/2015    Dyslipidemia 01/21/2015   Vitamin D deficiency 12/19/2011   Personal history of other infectious and parasitic diseases 10/25/2011   Anxiety disorder 05/12/2011   Hyperlipidemia 05/12/2011   Hypertriglyceridemia 05/12/2011   Obstructive sleep apnea 05/12/2011    Current Outpatient Medications:    acetaminophen (TYLENOL) 325 MG tablet, Take 2 tablets (650 mg total) by mouth every 4 (four) hours as needed for mild pain (or temp > 37.5 C (99.5 F))., Disp: , Rfl:    amiodarone (PACERONE) 200 MG tablet, Take 1 tablet (200 mg total) by mouth 2 (two) times daily. (Patient taking differently: Take 200 mg by mouth 2 (two) times daily.), Disp: 60 tablet, Rfl: 6   apixaban (ELIQUIS) 5 MG TABS tablet, TAKE 1 TABLET(5 MG) BY MOUTH TWICE DAILY, Disp: 60 tablet, Rfl: 11   atorvastatin (LIPITOR) 80 MG tablet, Take 1 tablet (80 mg total) by mouth at bedtime., Disp: 30 tablet, Rfl: 11   carvedilol (COREG) 25 MG tablet, TAKE 1 TABLET (25 MG TOTAL) BY MOUTH 2 (TWO) TIMES DAILY., Disp: 60 tablet, Rfl: 0   dapagliflozin propanediol (FARXIGA) 10 MG TABS tablet, Take 1 tablet (10 mg total) by mouth daily before breakfast., Disp: 90 tablet, Rfl: 3   Evolocumab (REPATHA SURECLICK) 756 MG/ML SOAJ, Inject 140 mg into the skin every 14 (fourteen) days., Disp: 2 mL, Rfl: 11   fenofibrate 54 MG tablet, Take 54 mg by mouth daily., Disp: , Rfl:    furosemide (LASIX) 80 MG tablet, Take 1 tablet (80 mg total) by mouth 2 (two) times daily., Disp: 60 tablet, Rfl: 6   hydrALAZINE (APRESOLINE) 100 MG tablet, Take 100 mg by mouth 3 (three) times daily., Disp: , Rfl:  insulin glargine (LANTUS SOLOSTAR) 100 UNIT/ML Solostar Pen, Inject 35 Units into the skin at bedtime., Disp: 31.5 mL, Rfl: 3   Insulin Pen Needle (PEN NEEDLES) 31G X 6 MM MISC, 1 pen by Does not apply route daily., Disp: 100 each, Rfl: 11   isosorbide mononitrate (IMDUR) 60 MG 24 hr tablet, Take 1 tablet (60 mg total) by mouth daily., Disp: 30 tablet, Rfl: 11    latanoprost (XALATAN) 0.005 % ophthalmic solution, Place 1 drop into both eyes at bedtime., Disp: , Rfl:    meclizine (ANTIVERT) 25 MG tablet, Take 1 tablet (25 mg total) by mouth 3 (three) times daily as needed for dizziness., Disp: 90 tablet, Rfl: 3   metolazone (ZAROXOLYN) 2.5 MG tablet, Take 1 tablet (2.5 mg total) by mouth as directed. Per Heart Failure Team instructions., Disp: 6 tablet, Rfl: 3   pantoprazole (PROTONIX) 40 MG tablet, Take 1 tablet (40 mg total) by mouth 2 (two) times daily., Disp: 180 tablet, Rfl: 3   sertraline (ZOLOFT) 50 MG tablet, TAKE 1 TABLET (50 MG TOTAL) BY MOUTH DAILY., Disp: 30 tablet, Rfl: 0   VASCEPA 1 g capsule, Take 2 capsules (2 g total) by mouth 2 (two) times daily., Disp: 120 capsule, Rfl: 5   metoprolol tartrate (LOPRESSOR) 100 MG tablet, Take 1 tablet (100 mg total) two hours prior to CT testing. (Patient not taking: No sig reported), Disp: 1 tablet, Rfl: 0 Allergies  Allergen Reactions   Lisinopril Cough      Social History   Socioeconomic History   Marital status: Legally Separated    Spouse name: Not on file   Number of children: Not on file   Years of education: Not on file   Highest education level: Not on file  Occupational History   Not on file  Tobacco Use   Smoking status: Never   Smokeless tobacco: Never  Vaping Use   Vaping Use: Never used  Substance and Sexual Activity   Alcohol use: Yes    Alcohol/week: 1.0 - 2.0 standard drink    Types: 1 - 2 Glasses of wine per week    Comment: rare   Drug use: No   Sexual activity: Yes    Birth control/protection: None  Other Topics Concern   Not on file  Social History Narrative   Not on file   Social Determinants of Health   Financial Resource Strain: Not on file  Food Insecurity: Not on file  Transportation Needs: Not on file  Physical Activity: Not on file  Stress: Not on file  Social Connections: Not on file  Intimate Partner Violence: Not on file    Physical  Exam      Future Appointments  Date Time Provider [place]  02/25/2021  3:30 PM [facility]  03/22/2021  8:35 AM [facility] DEVICE REMOTES [facility]  03/30/2021 11:30 AM Sherran Needs, NP MC-AFIBC None  04/09/2021 11:40 AM Larey Dresser, MD MC-[facility] None  04/19/2021 11:00 AM Vevelyn Francois, NP [place] None  05/27/2021  8:45 AM Frann Rider, NP GNA-GNA None  06/08/2021  8:30 AM Constance Haw, MD [facility]  06/21/2021  8:35 AM [facility] DEVICE REMOTES [facility]  09/20/2021  8:35 AM [facility] DEVICE REMOTES [facility]  12/20/2021  8:35 AM [facility] DEVICE REMOTES [facility]    BP 132/80   Pulse 70   Resp 18   Wt 240 lb (108.9 kg)   SpO2 98%   BMI 34.44 kg/m  Weight yesterday-? Last visit weight-241   Pt reports he is doing ok. He denies increased sob, no increased dizziness, no c/p.  He had CT done yesterday-labs were done as well-his numbers are elevated.  I called [facility] to see if those results were faxed over to them. No answer- I LVM for them to return call. He does see them on Monday.  I also contacted heart clinic to see if any med changes are needed at this time-they deferred to [facility] for med management.  He did take metolazone last week and his lasix was increased to 80mg  BID UFN.   Meds verified and will f/u next week for these refills that are needed.  I filled 2 wks worth of meds.  Will f/u after his kidney appointment on Monday.   Refills needed-  Farxiga Eliquis Isosorbide Pantoprazole Sertraline-needs next week Tresea Mall, [place] Paramedic  02/25/21

## 2021-02-25 ENCOUNTER — Other Ambulatory Visit: Payer: Medicare Other

## 2021-02-25 ENCOUNTER — Other Ambulatory Visit: Payer: Self-pay

## 2021-02-25 DIAGNOSIS — E785 Hyperlipidemia, unspecified: Secondary | ICD-10-CM | POA: Diagnosis not present

## 2021-02-25 LAB — HEPATIC FUNCTION PANEL
ALT: 18 IU/L (ref 0–44)
AST: 26 IU/L (ref 0–40)
Albumin: 4.3 g/dL (ref 3.8–4.9)
Alkaline Phosphatase: 54 IU/L (ref 44–121)
Bilirubin Total: 0.4 mg/dL (ref 0.0–1.2)
Bilirubin, Direct: 0.15 mg/dL (ref 0.00–0.40)
Total Protein: 7.5 g/dL (ref 6.0–8.5)

## 2021-02-25 LAB — LIPID PANEL
Chol/HDL Ratio: 2.5 ratio (ref 0.0–5.0)
Cholesterol, Total: 120 mg/dL (ref 100–199)
HDL: 48 mg/dL (ref >39)
LDL Chol Calc (NIH): 33 mg/dL (ref 0–99)
Triglycerides: 260 mg/dL — ABNORMAL HIGH (ref 0–149)
VLDL Cholesterol Cal: 39 mg/dL (ref 5–40)

## 2021-02-25 LAB — LDL CHOLESTEROL, DIRECT: LDL Direct: 30 mg/dL (ref 0–99)

## 2021-03-01 NOTE — Pre-Procedure Instructions (Signed)
Attempted to call patient regarding procedure instructions for tomorrow.  Left voice mail on the following items: Arrival time 0830 Nothing to eat or drink after midnight No meds AM of procedure Responsible person to drive you home and stay with you for 24 hrs  Have you missed any doses of anti-coagulant Eliquis- take both doses today, don't take any in the morning

## 2021-03-01 NOTE — Anesthesia Preprocedure Evaluation (Addendum)
Anesthesia Evaluation  Patient identified by MRN, date of birth, ID band Patient awake    Reviewed: Allergy & Precautions, H&P , NPO status , Patient's Chart, lab work & pertinent test results  Airway Mallampati: IV  TM Distance: >3 FB Neck ROM: Full    Dental no notable dental hx. (+) Teeth Intact, Dental Advisory Given   Pulmonary sleep apnea ,    Pulmonary exam normal breath sounds clear to auscultation       Cardiovascular Exercise Tolerance: Good hypertension, Pt. on medications +CHF  + Cardiac Defibrillator  Rhythm:Regular Rate:Normal     Neuro/Psych  Headaches, Anxiety Depression    GI/Hepatic negative GI ROS, Neg liver ROS,   Endo/Other  diabetes, Insulin Dependent  Renal/GU Renal InsufficiencyRenal disease  negative genitourinary   Musculoskeletal   Abdominal   Peds  Hematology negative hematology ROS (+)   Anesthesia Other Findings   Reproductive/Obstetrics negative OB ROS                            Anesthesia Physical Anesthesia Plan  ASA: 3  Anesthesia Plan: General   Post-op Pain Management: Tylenol PO (pre-op) and Minimal or no pain anticipated   Induction: Intravenous  PONV Risk Score and Plan: 3 and Dexamethasone and Midazolam  Airway Management Planned: Oral ETT and Video Laryngoscope Planned  Additional Equipment:   Intra-op Plan:   Post-operative Plan: Extubation in OR  Informed Consent: I have reviewed the patients History and Physical, chart, labs and discussed the procedure including the risks, benefits and alternatives for the proposed anesthesia with the patient or authorized representative who has indicated his/her understanding and acceptance.     Dental advisory given  Plan Discussed with: CRNA  Anesthesia Plan Comments:        Anesthesia Quick Evaluation

## 2021-03-02 ENCOUNTER — Ambulatory Visit (HOSPITAL_COMMUNITY): Payer: Medicare Other | Admitting: Anesthesiology

## 2021-03-02 ENCOUNTER — Ambulatory Visit (HOSPITAL_COMMUNITY)
Admission: RE | Admit: 2021-03-02 | Discharge: 2021-03-02 | Disposition: A | Discharge status: Home / Self Care | Payer: Medicare Other | Source: Ambulatory Visit | Attending: Cardiology | Admitting: Cardiology

## 2021-03-02 ENCOUNTER — Encounter (HOSPITAL_COMMUNITY)
Admission: RE | Disposition: A | Discharge status: Home / Self Care | Payer: Self-pay | Source: Ambulatory Visit | Attending: Cardiology

## 2021-03-02 ENCOUNTER — Other Ambulatory Visit: Payer: Self-pay

## 2021-03-02 DIAGNOSIS — I5022 Chronic systolic (congestive) heart failure: Secondary | ICD-10-CM | POA: Diagnosis not present

## 2021-03-02 DIAGNOSIS — I130 Hypertensive heart and chronic kidney disease with heart failure and stage 1 through stage 4 chronic kidney disease, or unspecified chronic kidney disease: Secondary | ICD-10-CM | POA: Diagnosis not present

## 2021-03-02 DIAGNOSIS — I428 Other cardiomyopathies: Secondary | ICD-10-CM | POA: Insufficient documentation

## 2021-03-02 DIAGNOSIS — Z79899 Other long term (current) drug therapy: Secondary | ICD-10-CM | POA: Insufficient documentation

## 2021-03-02 DIAGNOSIS — I4819 Other persistent atrial fibrillation: Secondary | ICD-10-CM | POA: Insufficient documentation

## 2021-03-02 DIAGNOSIS — Z794 Long term (current) use of insulin: Secondary | ICD-10-CM | POA: Diagnosis not present

## 2021-03-02 DIAGNOSIS — I110 Hypertensive heart disease with heart failure: Secondary | ICD-10-CM | POA: Insufficient documentation

## 2021-03-02 DIAGNOSIS — E1122 Type 2 diabetes mellitus with diabetic chronic kidney disease: Secondary | ICD-10-CM | POA: Diagnosis not present

## 2021-03-02 DIAGNOSIS — I4891 Unspecified atrial fibrillation: Secondary | ICD-10-CM | POA: Diagnosis not present

## 2021-03-02 DIAGNOSIS — I5042 Chronic combined systolic (congestive) and diastolic (congestive) heart failure: Secondary | ICD-10-CM | POA: Diagnosis not present

## 2021-03-02 DIAGNOSIS — Z7901 Long term (current) use of anticoagulants: Secondary | ICD-10-CM | POA: Insufficient documentation

## 2021-03-02 DIAGNOSIS — N184 Chronic kidney disease, stage 4 (severe): Secondary | ICD-10-CM | POA: Diagnosis not present

## 2021-03-02 HISTORY — PX: ATRIAL FIBRILLATION ABLATION: EP1191

## 2021-03-02 LAB — GLUCOSE, CAPILLARY
Glucose-Capillary: 64 mg/dL — ABNORMAL LOW (ref 70–99)
Glucose-Capillary: 39 mg/dL — CL (ref 70–99)
Glucose-Capillary: 44 mg/dL — CL (ref 70–99)
Glucose-Capillary: 71 mg/dL (ref 70–99)
Glucose-Capillary: 113 mg/dL — ABNORMAL HIGH (ref 70–99)
Glucose-Capillary: 152 mg/dL — ABNORMAL HIGH (ref 70–99)
Glucose-Capillary: 104 mg/dL — ABNORMAL HIGH (ref 70–99)
Glucose-Capillary: 93 mg/dL (ref 70–99)
Glucose-Capillary: 93 mg/dL (ref 70–99)
Glucose-Capillary: 137 mg/dL — ABNORMAL HIGH (ref 70–99)
Glucose-Capillary: 94 mg/dL (ref 70–99)

## 2021-03-02 LAB — POCT ACTIVATED CLOTTING TIME
Activated Clotting Time: 265 seconds
Activated Clotting Time: 294 seconds
Activated Clotting Time: 347 seconds

## 2021-03-02 SURGERY — ATRIAL FIBRILLATION ABLATION
Anesthesia: General

## 2021-03-02 MED ORDER — DEXTROSE-NACL 5-0.45 % IV SOLN: INTRAVENOUS | Status: DC | PRN

## 2021-03-02 MED ORDER — HEPARIN SODIUM (PORCINE) 1000 UNIT/ML IJ SOLN
INTRAMUSCULAR | Status: DC | PRN
  Administered 2021-03-02: 1000 [IU] via INTRAVENOUS

## 2021-03-02 MED ORDER — SODIUM CHLORIDE 0.9% FLUSH: 3.0000 mL | Freq: Two times a day (BID) | INTRAVENOUS | Status: DC

## 2021-03-02 MED ORDER — ACETAMINOPHEN 325 MG PO TABS
650.0000 mg | ORAL_TABLET | ORAL | Status: DC | PRN
  Administered 2021-03-02: 650 mg via ORAL
  Filled 2021-03-02 (×2): qty 2

## 2021-03-02 MED ORDER — PROTAMINE SULFATE 10 MG/ML IV SOLN
INTRAVENOUS | Status: DC | PRN
  Administered 2021-03-02: 40 mg via INTRAVENOUS
  Administered 2021-03-02: 30 mg via INTRAVENOUS

## 2021-03-02 MED ORDER — ACETAMINOPHEN 500 MG PO TABS
1000.0000 mg | ORAL_TABLET | Freq: Once | ORAL | Status: AC
  Administered 2021-03-02: 1000 mg via ORAL
  Filled 2021-03-02: qty 2

## 2021-03-02 MED ORDER — ACETAMINOPHEN 325 MG PO TABS
ORAL_TABLET | ORAL | Status: AC
  Filled 2021-03-02: qty 2

## 2021-03-02 MED ORDER — SODIUM CHLORIDE 0.9 % IV SOLN: INTRAVENOUS | Status: DC

## 2021-03-02 MED ORDER — ROCURONIUM BROMIDE 10 MG/ML (PF) SYRINGE
PREFILLED_SYRINGE | INTRAVENOUS | Status: DC | PRN
Start: 2021-03-02 — End: 2021-03-02
  Administered 2021-03-02: 40 mg via INTRAVENOUS

## 2021-03-02 MED ORDER — DOBUTAMINE INFUSION FOR EP/ECHO/NUC (1000 MCG/ML)
INTRAVENOUS | Status: DC | PRN
  Administered 2021-03-02: 20 ug/kg/min via INTRAVENOUS

## 2021-03-02 MED ORDER — HYDRALAZINE HCL 20 MG/ML IJ SOLN
INTRAMUSCULAR | Status: AC
  Filled 2021-03-02: qty 1

## 2021-03-02 MED ORDER — HEPARIN SODIUM (PORCINE) 1000 UNIT/ML IJ SOLN
INTRAMUSCULAR | Status: DC | PRN
  Administered 2021-03-02: 1000 [IU] via INTRAVENOUS
  Administered 2021-03-02: 5000 [IU] via INTRAVENOUS
  Administered 2021-03-02: 6000 [IU] via INTRAVENOUS
  Administered 2021-03-02: 15000 [IU] via INTRAVENOUS

## 2021-03-02 MED ORDER — FENTANYL CITRATE (PF) 100 MCG/2ML IJ SOLN
INTRAMUSCULAR | Status: DC | PRN
  Administered 2021-03-02: 100 ug via INTRAVENOUS

## 2021-03-02 MED ORDER — HEPARIN (PORCINE) IN NACL 1000-0.9 UT/500ML-% IV SOLN
INTRAVENOUS | Status: AC
  Filled 2021-03-02: qty 500

## 2021-03-02 MED ORDER — DEXAMETHASONE SODIUM PHOSPHATE 10 MG/ML IJ SOLN
INTRAMUSCULAR | Status: DC | PRN
  Administered 2021-03-02: 4 mg via INTRAVENOUS

## 2021-03-02 MED ORDER — HYDRALAZINE HCL 20 MG/ML IJ SOLN
10.0000 mg | Freq: Once | INTRAMUSCULAR | Status: AC
  Administered 2021-03-02: 10 mg via INTRAVENOUS

## 2021-03-02 MED ORDER — ONDANSETRON HCL 4 MG/2ML IJ SOLN: 4.0000 mg | Freq: Four times a day (QID) | INTRAMUSCULAR | Status: DC | PRN

## 2021-03-02 MED ORDER — LIDOCAINE 2% (20 MG/ML) 5 ML SYRINGE
INTRAMUSCULAR | Status: DC | PRN
  Administered 2021-03-02: 60 mg via INTRAVENOUS

## 2021-03-02 MED ORDER — MIDAZOLAM HCL 5 MG/5ML IJ SOLN
INTRAMUSCULAR | Status: DC | PRN
  Administered 2021-03-02 (×2): 1 mg via INTRAVENOUS

## 2021-03-02 MED ORDER — PHENYLEPHRINE HCL-NACL 20-0.9 MG/250ML-% IV SOLN
INTRAVENOUS | Status: DC | PRN
  Administered 2021-03-02: 25 ug/min via INTRAVENOUS

## 2021-03-02 MED ORDER — DEXTROSE-NACL 5-0.45 % IV SOLN: Freq: Once | INTRAVENOUS | Status: AC

## 2021-03-02 MED ORDER — CEFAZOLIN SODIUM-DEXTROSE 2-4 GM/100ML-% IV SOLN
INTRAVENOUS | Status: AC
  Filled 2021-03-02: qty 100

## 2021-03-02 MED ORDER — SUCCINYLCHOLINE CHLORIDE 200 MG/10ML IV SOSY
PREFILLED_SYRINGE | INTRAVENOUS | Status: DC | PRN
  Administered 2021-03-02: 140 mg via INTRAVENOUS

## 2021-03-02 MED ORDER — ONDANSETRON HCL 4 MG/2ML IJ SOLN
INTRAMUSCULAR | Status: DC | PRN
  Administered 2021-03-02: 4 mg via INTRAVENOUS

## 2021-03-02 MED ORDER — PHENYLEPHRINE 40 MCG/ML (10ML) SYRINGE FOR IV PUSH (FOR BLOOD PRESSURE SUPPORT)
PREFILLED_SYRINGE | INTRAVENOUS | Status: DC | PRN
  Administered 2021-03-02: 80 ug via INTRAVENOUS

## 2021-03-02 MED ORDER — DEXTROSE 50 % IV SOLN
50.0000 mL | Freq: Once | INTRAVENOUS | Status: AC
  Administered 2021-03-02: 50 mL via INTRAVENOUS

## 2021-03-02 MED ORDER — SODIUM CHLORIDE 0.9 % IV SOLN: 250.0000 mL | INTRAVENOUS | Status: DC | PRN

## 2021-03-02 MED ORDER — SODIUM CHLORIDE 0.9% FLUSH: 3.0000 mL | INTRAVENOUS | Status: DC | PRN

## 2021-03-02 MED ORDER — SODIUM CHLORIDE 0.9 % IV SOLN: INTRAVENOUS | Status: DC | PRN

## 2021-03-02 MED ORDER — HEPARIN (PORCINE) IN NACL 1000-0.9 UT/500ML-% IV SOLN
INTRAVENOUS | Status: DC | PRN
  Administered 2021-03-02 (×4): 500 mL

## 2021-03-02 MED ORDER — HEPARIN SODIUM (PORCINE) 1000 UNIT/ML IJ SOLN
INTRAMUSCULAR | Status: AC
  Filled 2021-03-02: qty 1

## 2021-03-02 MED ORDER — DEXTROSE 50 % IV SOLN
INTRAVENOUS | Status: AC
  Administered 2021-03-02: 25 mL
  Filled 2021-03-02: qty 50

## 2021-03-02 MED ORDER — DEXTROSE 50 % IV SOLN
INTRAVENOUS | Status: AC
  Filled 2021-03-02: qty 50

## 2021-03-02 MED ORDER — DEXTROSE 50 % IV SOLN
25.0000 mL | Freq: Once | INTRAVENOUS | Status: DC
  Filled 2021-03-02: qty 50

## 2021-03-02 MED ORDER — SUGAMMADEX SODIUM 200 MG/2ML IV SOLN
INTRAVENOUS | Status: DC | PRN
  Administered 2021-03-02: 200 mg via INTRAVENOUS

## 2021-03-02 MED ORDER — DOBUTAMINE INFUSION FOR EP/ECHO/NUC (1000 MCG/ML)
INTRAVENOUS | Status: AC
  Filled 2021-03-02: qty 250

## 2021-03-02 MED ORDER — PROPOFOL 10 MG/ML IV BOLUS
INTRAVENOUS | Status: DC | PRN
  Administered 2021-03-02: 100 mg via INTRAVENOUS

## 2021-03-02 MED ORDER — CEFAZOLIN SODIUM-DEXTROSE 2-3 GM-%(50ML) IV SOLR
INTRAVENOUS | Status: DC | PRN
  Administered 2021-03-02: 2 g via INTRAVENOUS

## 2021-03-02 SURGICAL SUPPLY — 23 items
BAG SNAP BAND KOVER 36X36 (MISCELLANEOUS) ×2 IMPLANT
BLANKET WARM UNDERBOD FULL ACC (MISCELLANEOUS) ×2 IMPLANT
CATH OCTARAY 2.0 F 3-3-3-3-3 (CATHETERS) ×2 IMPLANT
CATH S CIRCA THERM PROBE 10F (CATHETERS) ×2 IMPLANT
CATH SMTCH THERMOCOOL SF DF (CATHETERS) ×2 IMPLANT
CATH SOUNDSTAR ECO 8FR (CATHETERS) ×2 IMPLANT
CATH WEBSTER BI DIR CS D-F CRV (CATHETERS) ×2 IMPLANT
CLOSURE PERCLOSE PROSTYLE (VASCULAR PRODUCTS) ×8 IMPLANT
COVER SWIFTLINK CONNECTOR (BAG) ×2 IMPLANT
MAT PREVALON FULL STRYKER (MISCELLANEOUS) ×2 IMPLANT
PACK EP LATEX FREE (CUSTOM PROCEDURE TRAY) ×1
PACK EP LF (CUSTOM PROCEDURE TRAY) ×1 IMPLANT
PAD DEFIB RADIO PHYSIO CONN (PAD) ×2 IMPLANT
PATCH CARTO3 (PAD) ×2 IMPLANT
SHEATH BAYLIS SUREFLEX  M 8.5 (SHEATH) ×1
SHEATH BAYLIS SUREFLEX M 8.5 (SHEATH) ×1 IMPLANT
SHEATH BAYLIS TRANSSEPTAL 98CM (NEEDLE) ×2 IMPLANT
SHEATH CARTO VIZIGO SM CVD (SHEATH) ×2 IMPLANT
SHEATH PINNACLE 7F 10CM (SHEATH) ×2 IMPLANT
SHEATH PINNACLE 8F 10CM (SHEATH) ×4 IMPLANT
SHEATH PINNACLE 9F 10CM (SHEATH) ×2 IMPLANT
SHEATH PROBE COVER 6X72 (BAG) ×2 IMPLANT
TUBING SMART ABLATE COOLFLOW (TUBING) ×2 IMPLANT

## 2021-03-02 NOTE — Discharge Instructions (Signed)
Post procedure care instructions No driving for 4 days. No lifting over 5 lbs for 1 week. No vigorous or sexual activity for 1 week. You may return to work/your usual activities on 03/10/21. Keep procedure site clean & dry. If you notice increased pain, swelling, bleeding or pus, call/return!  You may shower after 24 hours, but no soaking in baths/hot tubs/pools for 1 week.    You have an appointment set up with the [facility].  Multiple studies have shown that being followed by a dedicated atrial fibrillation clinic in addition to the standard care you receive from your other physicians improves health. We believe that enrollment in the atrial fibrillation clinic will allow [place] to better care for you.   The phone number to the [facility] is [phone]. The clinic is staffed Monday through Friday from 8:30am to 5pm.  Parking Directions: The clinic is located in the Heart and Vascular Building connected to [facility]. 1)From [address] turn on to [address] and go to the 3rd entrance  (Heart and Vascular entrance) on the right. 2)Look to the right for Heart &Vascular Parking Garage. 3)A code for the entrance is required, for Dec is 5555.   4)Take the elevators to the 1st floor. Registration is in the room with the glass walls at the end of the hallway.  If you have any trouble parking or locating the clinic, please don't hesitate to call [phone].

## 2021-03-02 NOTE — Transfer of Care (Signed)
Immediate Anesthesia Transfer of Care Note  Patient: AKEEM HEPPLER  Procedure(s) Performed: ATRIAL FIBRILLATION ABLATION  Patient Location: Cath Lab  Anesthesia Type:General  Level of Consciousness: awake and alert   Airway & Oxygen Therapy: Patient Spontanous Breathing and Patient connected to nasal cannula oxygen  Post-op Assessment: Report given to RN and Post -op Vital signs reviewed and stable  Post vital signs: Reviewed and stable  Last Vitals:  Vitals Value Taken Time  BP 174/86 03/02/21 1348  Temp    Pulse 59 03/02/21 1350  Resp 18 03/02/21 1350  SpO2 98 % 03/02/21 1350  Vitals shown include unvalidated device data.  Last Pain:  Vitals:   03/02/21 0827  TempSrc:   PainSc: 0-No pain         Complications: There were no known notable events for this encounter.

## 2021-03-02 NOTE — Anesthesia Procedure Notes (Signed)
Procedure Name: Intubation Date/Time: 03/02/2021 10:56 AM Performed by: Wilburn Cornelia, CRNA Pre-anesthesia Checklist: Patient identified, Emergency Drugs available, Suction available, Patient being monitored and Timeout performed Patient Re-evaluated:Patient Re-evaluated prior to induction Oxygen Delivery Method: Circle system utilized Preoxygenation: Pre-oxygenation with 100% oxygen Induction Type: IV induction Ventilation: Mask ventilation with difficulty, Two handed mask ventilation required and Oral airway inserted - appropriate to patient size Laryngoscope Size: Mac and 4 Grade View: Grade I Tube type: Oral Tube size: 7.5 mm Number of attempts: 2 Airway Equipment and Method: Rigid stylet and Video-laryngoscopy Placement Confirmation: ETT inserted through vocal cords under direct vision, positive ETCO2, CO2 detector and breath sounds checked- equal and bilateral Secured at: 23 cm Tube secured with: Tape Dental Injury: Teeth and Oropharynx as per pre-operative assessment  Difficulty Due To: Difficulty was anticipated and Difficult Airway- due to large tongue Future Recommendations: Recommend- induction with short-acting agent, and alternative techniques readily available

## 2021-03-02 NOTE — Progress Notes (Signed)
Dr Curt Bears and Dr Ola Spurr informed of CBG readings.

## 2021-03-02 NOTE — Interval H&P Note (Signed)
History and Physical Interval Note:  03/02/2021 10:00 AM  Troy Rush  has presented today for surgery, with the diagnosis of afib.  The various methods of treatment have been discussed with the patient and family. After consideration of risks, benefits and other options for treatment, the patient has consented to  Procedure(s): ATRIAL FIBRILLATION ABLATION (N/A) as a surgical intervention.  The patient's history has been reviewed, patient examined, no change in status, stable for surgery.  I have reviewed the patient's chart and labs.  Questions were answered to the patient's satisfaction.     Callista Hoh [employer]

## 2021-03-02 NOTE — Anesthesia Postprocedure Evaluation (Signed)
Anesthesia Post Note  Patient: Troy Rush  Procedure(s) Performed: ATRIAL FIBRILLATION ABLATION     Patient location during evaluation: Cath Lab Anesthesia Type: General Level of consciousness: awake and alert Pain management: pain level controlled Vital Signs Assessment: post-procedure vital signs reviewed and stable Respiratory status: spontaneous breathing, nonlabored ventilation and respiratory function stable Cardiovascular status: blood pressure returned to baseline and stable Postop Assessment: no apparent nausea or vomiting Anesthetic complications: no   There were no known notable events for this encounter.  Last Vitals:  Vitals:   03/02/21 1527 03/02/21 1543  BP: (!) 173/84 122/72  Pulse: 65 69  Resp: 14 16  Temp:    SpO2: 100% 100%    Last Pain:  Vitals:   03/02/21 1511  TempSrc:   PainSc: 0-No pain                 Thurl Boen,W. EDMOND

## 2021-03-03 ENCOUNTER — Encounter (HOSPITAL_COMMUNITY): Payer: Self-pay | Admitting: Cardiology

## 2021-03-08 ENCOUNTER — Telehealth (HOSPITAL_COMMUNITY): Payer: Self-pay

## 2021-03-08 NOTE — Telephone Encounter (Signed)
[place] to order the following:   Farxiga Eliquis Isosorbide Pantoprazole Sertraline [place], EMT-Paramedic  03/08/21

## 2021-03-10 ENCOUNTER — Other Ambulatory Visit (HOSPITAL_COMMUNITY): Payer: Self-pay

## 2021-03-10 NOTE — Progress Notes (Signed)
Paramedicine Encounter    Patient ID: Troy Rush, male    DOB: 03-20-1971, 50 y.o.   MRN: [id number]   Patient Care Team: Vevelyn Francois, NP as PCP - General (Adult Health Nurse Practitioner) Larey Dresser, MD as PCP - Cardiology (Cardiology) Conrad Spencer, NP as Nurse Practitioner (Cardiology) Larey Dresser, MD as Consulting Physician (Cardiology) Jorge Ny, LCSW as Social Worker (Licensed Clinical Social Worker)  Patient Active Problem List   Diagnosis Date Noted  . Subcortical infarction ([place]) 10/03/2020  . Cerebral thrombosis with cerebral infarction 10/01/2020  . Unilateral weakness 09/30/2020  . History of atrial fibrillation 09/30/2020  . History of DVT (deep vein thrombosis) 09/30/2020  . CKD (chronic kidney disease) stage 3, GFR 30-59 ml/min (HCC) 07/08/2020  . Acute CHF (congestive heart failure) ([place]) 07/07/2020  . CKD (chronic kidney disease), stage IV ([place]) 07/07/2020  . ICD (implantable cardioverter-defibrillator) in place 05/14/2020  . Pulmonary embolism ([place]) 02/05/2020  . Acute kidney injury superimposed on CKD ([place])   . Chronic combined systolic and diastolic heart failure ([place])   . Atrial fibrillation with RVR ([place]) 02/01/2020  . NSVT (nonsustained ventricular tachycardia) 02/01/2020  . Acute GI bleeding 02/01/2020  . DKA (diabetic ketoacidosis) ([place]) 02/01/2020  . Bilateral deafness 08/15/2019  . Type 2 diabetes mellitus with complication, with long-term current use of insulin ([place]) 08/15/2019  . Hemoglobin A1C between 7% and 9% indicating borderline diabetic control ([place]) 08/15/2019  . Insomnia 08/15/2019  . Abdominal pain 11/14/2018  . Ischemic cardiomyopathy 10/14/2016  . NICM (nonischemic cardiomyopathy) ([place]) 09/15/2016  . Chronic systolic CHF (congestive heart failure) ([place]) 05/30/2016  . HTN (hypertension) 05/30/2016  . Deaf 05/30/2016  . Hyperkalemia 05/30/2016  . Major depressive disorder, recurrent episode ([place]) 03/21/2016  .  Bronchitis 01/21/2015  . Dyslipidemia 01/21/2015  . Vitamin D deficiency 12/19/2011  . Personal history of other infectious and parasitic diseases 10/25/2011  . Anxiety disorder 05/12/2011  . Hyperlipidemia 05/12/2011  . Hypertriglyceridemia 05/12/2011  . Obstructive sleep apnea 05/12/2011    Current Outpatient Medications:  .  acetaminophen (TYLENOL) 325 MG tablet, Take 2 tablets (650 mg total) by mouth every 4 (four) hours as needed for mild pain (or temp > 37.5 C (99.5 F))., Disp: , Rfl:  .  amiodarone (PACERONE) 200 MG tablet, Take 1 tablet (200 mg total) by mouth 2 (two) times daily. (Patient taking differently: Take 200 mg by mouth daily.), Disp: 60 tablet, Rfl: 6 .  apixaban (ELIQUIS) 5 MG TABS tablet, TAKE 1 TABLET(5 MG) BY MOUTH TWICE DAILY, Disp: 60 tablet, Rfl: 11 .  atorvastatin (LIPITOR) 80 MG tablet, Take 1 tablet (80 mg total) by mouth at bedtime., Disp: 30 tablet, Rfl: 11 .  carvedilol (COREG) 25 MG tablet, TAKE 1 TABLET (25 MG TOTAL) BY MOUTH 2 (TWO) TIMES DAILY., Disp: 60 tablet, Rfl: 0 .  dapagliflozin propanediol (FARXIGA) 10 MG TABS tablet, Take 1 tablet (10 mg total) by mouth daily before breakfast., Disp: 90 tablet, Rfl: 3 .  Evolocumab (REPATHA SURECLICK) 195 MG/ML SOAJ, Inject 140 mg into the skin every 14 (fourteen) days., Disp: 2 mL, Rfl: 11 .  fenofibrate 54 MG tablet, Take 54 mg by mouth daily., Disp: , Rfl:  .  furosemide (LASIX) 80 MG tablet, Take 1 tablet (80 mg total) by mouth 2 (two) times daily., Disp: 60 tablet, Rfl: 6 .  hydrALAZINE (APRESOLINE) 100 MG tablet, Take 100 mg by mouth 3 (three) times daily., Disp: , Rfl:  .  insulin glargine (LANTUS SOLOSTAR) 100 UNIT/ML Solostar Pen, Inject 35 Units into the skin at bedtime., Disp: 31.5 mL, Rfl: 3 .  Insulin Pen Needle (PEN NEEDLES) 31G X 6 MM MISC, 1 pen by Does not apply route daily., Disp: 100 each, Rfl: 11 .  isosorbide mononitrate (IMDUR) 60 MG 24 hr tablet, Take 1 tablet (60 mg total) by mouth daily.,  Disp: 30 tablet, Rfl: 11 .  latanoprost (XALATAN) 0.005 % ophthalmic solution, Place 1 drop into both eyes at bedtime., Disp: , Rfl:  .  meclizine (ANTIVERT) 25 MG tablet, Take 1 tablet (25 mg total) by mouth 3 (three) times daily as needed for dizziness. (Patient taking differently: Take 25 mg by mouth 3 (three) times daily.), Disp: 90 tablet, Rfl: 3 .  pantoprazole (PROTONIX) 40 MG tablet, Take 1 tablet (40 mg total) by mouth 2 (two) times daily., Disp: 180 tablet, Rfl: 3 .  sertraline (ZOLOFT) 50 MG tablet, TAKE 1 TABLET (50 MG TOTAL) BY MOUTH DAILY., Disp: 30 tablet, Rfl: 0 .  VASCEPA 1 g capsule, Take 2 capsules (2 g total) by mouth 2 (two) times daily., Disp: 120 capsule, Rfl: 5 .  metolazone (ZAROXOLYN) 2.5 MG tablet, Take 1 tablet (2.5 mg total) by mouth as directed. Per Heart Failure Team instructions. (Patient not taking: Reported on 03/10/2021), Disp: 6 tablet, Rfl: 3 .  metoprolol tartrate (LOPRESSOR) 100 MG tablet, Take 1 tablet (100 mg total) two hours prior to CT testing. (Patient not taking: Reported on 02/10/2021), Disp: 1 tablet, Rfl: 0 Allergies  Allergen Reactions  . Lisinopril Cough      Social History   Socioeconomic History  . Marital status: Legally Separated    Spouse name: Not on file  . Number of children: Not on file  . Years of education: Not on file  . Highest education level: Not on file  Occupational History  . Not on file  Tobacco Use  . Smoking status: Never  . Smokeless tobacco: Never  Vaping Use  . Vaping Use: Never used  Substance and Sexual Activity  . Alcohol use: Yes    Alcohol/week: 1.0 - 2.0 standard drink    Types: 1 - 2 Glasses of wine per week    Comment: rare  . Drug use: No  . Sexual activity: Yes    Birth control/protection: None  Other Topics Concern  . Not on file  Social History Narrative  . Not on file   Social Determinants of Health   Financial Resource Strain: Not on file  Food Insecurity: Not on file  Transportation  Needs: Not on file  Physical Activity: Not on file  Stress: Not on file  Social Connections: Not on file  Intimate Partner Violence: Not on file    Physical Exam      Future Appointments  Date Time Provider [place]  03/22/2021  8:35 AM [facility] DEVICE REMOTES [facility]  03/30/2021 11:30 AM Sherran Needs, NP [facility] None  04/09/2021 11:40 AM Larey Dresser, MD [facility] None  04/19/2021 11:00 AM Vevelyn Francois, NP [facility] None  05/27/2021  8:45 AM Frann Rider, NP [facility] None  06/08/2021  8:30 AM Constance Haw, MD [facility]  06/21/2021  8:35 AM [facility] DEVICE REMOTES [facility]  09/20/2021  8:35 AM [facility] DEVICE REMOTES [facility]  12/20/2021  8:35 AM [facility] DEVICE REMOTES [facility]    BP 132/80   Pulse 76   Resp 18   Wt 240 lb (108.9 kg)  SpO2 98%   BMI 34.44 kg/m  CBG EMS-564  His machine was almost 100 points lower than mine.   Weight yesterday-? Last visit weight-240  Pt had ablation done a couple wks ago. He reports no issues or concerns since that. He said it was successful.  He denies increased sob, no increased dizziness. No c/p.  Just tired. Still waiting on CPAP.  He reports compliance with his cardiomems.  He just took meds when I arrived.  No missed doses of meds. However he did miss insulin the past 2 nights. He said he would take it today, but not BID.  Told him he needed to get back on schedule.  I would like for him to look into a dexcom or freestyle. Meds verified and 2 wks worth of pill boxes refilled. He goes back to kidney doc tomor. I will f/u as soon as I can after that. He goes to work at 2.   Refills needed- Atorvastatin Lasix-waiting to see what his kidney doc doses him at [facility]   He has enough lasix to get through tomor afternoon dose.  Will have to come out tomor to finish out the pill boxes.  He is out of the  sertraline-waiting on doc to refill.     [name], [facility] Paramedic  03/10/21

## 2021-03-11 ENCOUNTER — Other Ambulatory Visit (HOSPITAL_COMMUNITY): Payer: Self-pay

## 2021-03-11 DIAGNOSIS — H9193 Unspecified hearing loss, bilateral: Secondary | ICD-10-CM | POA: Diagnosis not present

## 2021-03-11 DIAGNOSIS — I5042 Chronic combined systolic (congestive) and diastolic (congestive) heart failure: Secondary | ICD-10-CM | POA: Diagnosis not present

## 2021-03-11 DIAGNOSIS — E1122 Type 2 diabetes mellitus with diabetic chronic kidney disease: Secondary | ICD-10-CM | POA: Diagnosis not present

## 2021-03-11 DIAGNOSIS — N184 Chronic kidney disease, stage 4 (severe): Secondary | ICD-10-CM | POA: Diagnosis not present

## 2021-03-11 DIAGNOSIS — I129 Hypertensive chronic kidney disease with stage 1 through stage 4 chronic kidney disease, or unspecified chronic kidney disease: Secondary | ICD-10-CM | POA: Diagnosis not present

## 2021-03-11 DIAGNOSIS — R809 Proteinuria, unspecified: Secondary | ICD-10-CM | POA: Diagnosis not present

## 2021-03-15 ENCOUNTER — Other Ambulatory Visit: Payer: Self-pay

## 2021-03-15 ENCOUNTER — Emergency Department (HOSPITAL_COMMUNITY)
Admission: EM | Admit: 2021-03-15 | Discharge: 2021-03-15 | Disposition: A | Discharge status: Home / Self Care | Payer: Medicare Other | Attending: Emergency Medicine | Admitting: Emergency Medicine

## 2021-03-15 ENCOUNTER — Telehealth (HOSPITAL_COMMUNITY): Payer: Self-pay

## 2021-03-15 ENCOUNTER — Emergency Department (HOSPITAL_COMMUNITY): Payer: Medicare Other

## 2021-03-15 ENCOUNTER — Encounter (HOSPITAL_COMMUNITY): Payer: Self-pay

## 2021-03-15 DIAGNOSIS — Z20822 Contact with and (suspected) exposure to covid-19: Secondary | ICD-10-CM | POA: Diagnosis not present

## 2021-03-15 DIAGNOSIS — R0602 Shortness of breath: Secondary | ICD-10-CM | POA: Diagnosis not present

## 2021-03-15 DIAGNOSIS — I132 Hypertensive heart and chronic kidney disease with heart failure and with stage 5 chronic kidney disease, or end stage renal disease: Secondary | ICD-10-CM | POA: Insufficient documentation

## 2021-03-15 DIAGNOSIS — N186 End stage renal disease: Secondary | ICD-10-CM | POA: Diagnosis not present

## 2021-03-15 DIAGNOSIS — I160 Hypertensive urgency: Secondary | ICD-10-CM | POA: Diagnosis not present

## 2021-03-15 DIAGNOSIS — E1122 Type 2 diabetes mellitus with diabetic chronic kidney disease: Secondary | ICD-10-CM | POA: Insufficient documentation

## 2021-03-15 DIAGNOSIS — I509 Heart failure, unspecified: Secondary | ICD-10-CM | POA: Diagnosis not present

## 2021-03-15 DIAGNOSIS — I110 Hypertensive heart disease with heart failure: Secondary | ICD-10-CM | POA: Diagnosis not present

## 2021-03-15 DIAGNOSIS — Z79899 Other long term (current) drug therapy: Secondary | ICD-10-CM | POA: Diagnosis not present

## 2021-03-15 DIAGNOSIS — Z7901 Long term (current) use of anticoagulants: Secondary | ICD-10-CM | POA: Insufficient documentation

## 2021-03-15 DIAGNOSIS — I5042 Chronic combined systolic (congestive) and diastolic (congestive) heart failure: Secondary | ICD-10-CM | POA: Diagnosis not present

## 2021-03-15 DIAGNOSIS — R059 Cough, unspecified: Secondary | ICD-10-CM | POA: Diagnosis not present

## 2021-03-15 DIAGNOSIS — J811 Chronic pulmonary edema: Secondary | ICD-10-CM | POA: Diagnosis not present

## 2021-03-15 HISTORY — DX: End stage renal disease: N18.6

## 2021-03-15 HISTORY — DX: Disorder of kidney and ureter, unspecified: N28.9

## 2021-03-15 LAB — CBC
HCT: 42.3 % (ref 39.0–52.0)
Hemoglobin: 12.6 g/dL — ABNORMAL LOW (ref 13.0–17.0)
MCH: 27.9 pg (ref 26.0–34.0)
MCHC: 29.8 g/dL — ABNORMAL LOW (ref 30.0–36.0)
MCV: 93.8 fL (ref 80.0–100.0)
Platelets: 203 10*3/uL (ref 150–400)
RBC: 4.51 MIL/uL (ref 4.22–5.81)
RDW: 13 % (ref 11.5–15.5)
WBC: 5.4 10*3/uL (ref 4.0–10.5)
nRBC: 0.4 % — ABNORMAL HIGH (ref 0.0–0.2)

## 2021-03-15 LAB — BRAIN NATRIURETIC PEPTIDE: B Natriuretic Peptide: 501.6 pg/mL — ABNORMAL HIGH (ref 0.0–100.0)

## 2021-03-15 LAB — BASIC METABOLIC PANEL
Anion gap: 7 (ref 5–15)
BUN: 25 mg/dL — ABNORMAL HIGH (ref 6–20)
CO2: 26 mmol/L (ref 22–32)
Calcium: 8.9 mg/dL (ref 8.9–10.3)
Chloride: 105 mmol/L (ref 98–111)
Creatinine, Ser: 2.55 mg/dL — ABNORMAL HIGH (ref 0.61–1.24)
GFR, Estimated: 29 mL/min — ABNORMAL LOW (ref >60)
Glucose, Bld: 274 mg/dL — ABNORMAL HIGH (ref 70–99)
Potassium: 5.3 mmol/L — ABNORMAL HIGH (ref 3.5–5.1)
Sodium: 138 mmol/L (ref 135–145)

## 2021-03-15 LAB — MAGNESIUM: Magnesium: 2.3 mg/dL (ref 1.7–2.4)

## 2021-03-15 LAB — RESP PANEL BY RT-PCR (FLU A&B, COVID) ARPGX2
Influenza A by PCR: NEGATIVE
Influenza B by PCR: NEGATIVE
SARS Coronavirus 2 by RT PCR: NEGATIVE

## 2021-03-15 LAB — TROPONIN I (HIGH SENSITIVITY): Troponin I (High Sensitivity): 31 ng/L — ABNORMAL HIGH (ref <18)

## 2021-03-15 MED ORDER — HYDRALAZINE HCL 20 MG/ML IJ SOLN
20.0000 mg | Freq: Once | INTRAMUSCULAR | Status: AC
  Administered 2021-03-15: 20 mg via INTRAVENOUS
  Filled 2021-03-15: qty 1

## 2021-03-15 MED ORDER — POTASSIUM CHLORIDE CRYS ER 20 MEQ PO TBCR
20.0000 meq | EXTENDED_RELEASE_TABLET | Freq: Once | ORAL | Status: AC
  Administered 2021-03-15: 20 meq via ORAL
  Filled 2021-03-15: qty 1

## 2021-03-15 MED ORDER — CARVEDILOL 12.5 MG PO TABS
25.0000 mg | ORAL_TABLET | Freq: Once | ORAL | Status: AC
  Administered 2021-03-15: 25 mg via ORAL
  Filled 2021-03-15: qty 2

## 2021-03-15 MED ORDER — SODIUM ZIRCONIUM CYCLOSILICATE 10 G PO PACK
10.0000 g | PACK | Freq: Once | ORAL | Status: AC
  Administered 2021-03-15: 10 g via ORAL
  Filled 2021-03-15: qty 1

## 2021-03-15 MED ORDER — FUROSEMIDE 10 MG/ML IJ SOLN
40.0000 mg | Freq: Once | INTRAMUSCULAR | Status: AC
  Administered 2021-03-15: 40 mg via INTRAVENOUS
  Filled 2021-03-15: qty 4

## 2021-03-15 MED ORDER — ALBUTEROL SULFATE HFA 108 (90 BASE) MCG/ACT IN AERS
2.0000 | INHALATION_SPRAY | RESPIRATORY_TRACT | Status: DC | PRN
  Administered 2021-03-15: 2 via RESPIRATORY_TRACT
  Filled 2021-03-15: qty 6.7

## 2021-03-15 MED ORDER — NITROGLYCERIN 2 % TD OINT
1.0000 [in_us] | TOPICAL_OINTMENT | Freq: Four times a day (QID) | TRANSDERMAL | Status: DC
  Administered 2021-03-15: 1 [in_us] via TOPICAL
  Filled 2021-03-15: qty 1

## 2021-03-15 NOTE — Discharge Instructions (Signed)
Make sure to continue your blood pressure and diuretic medications.  Follow-up with your heart failure doctors to be rechecked

## 2021-03-15 NOTE — Progress Notes (Signed)
Came out for med rec- Placed the meclizine where needed and his kidney doc contacted me and will let me know for any dose changes for when his labs come back. Which may be this evening or more likely tomor (Friday)  Will f/u first thing Monday.   [name], EMT-Paramedic 03-11-21

## 2021-03-15 NOTE — ED Triage Notes (Signed)
Patient c/o a productive cough with yellow sputum, SOB since yesterday.  BP in triage-230/119. Patient states he did not take his BP med yesterday or today.

## 2021-03-15 NOTE — Telephone Encounter (Addendum)
[facility] called me and reported his labs are ok to continue the lasix dosing of 80mg  BID.   [name], EMT-Paramedic  03/16/21

## 2021-03-15 NOTE — ED Provider Notes (Signed)
[facility] DEPT Provider Note   CSN: [id number] Arrival date & time: 03/15/21  0758     History Chief Complaint  Patient presents with   Shortness of Breath   Cough    Troy Rush is a 50 y.o. male.   Shortness of Breath Associated symptoms: cough   Cough Associated symptoms: shortness of breath    Patient presents to the ER for evaluation of shortness of breath.  Patient states he started having symptoms yesterday.  He noticed some congestion and mild URI symptoms but last evening he began feeling more severely short of breath and that continued until this morning.  It is worse when he is lying flat.  He has not had any fevers.  He has not noticed any significant new leg swelling.  He thinks he might have a little bit but that is not unusual.  He has not had any chest pain.  Patient does have history of congestive heart failure and hypertension.  He thinks he missed some of his medications last evening and did not take them this morning.  He called EMS because of his breathing issues to come to the ED  Past Medical History:  Diagnosis Date   AICD (automatic cardioverter/defibrillator) present 10/14/2016   Bilateral deafness    Bronchitis    CHF (congestive heart failure) ([place])    Depression    Diabetes mellitus without complication ([place])    DVT, lower extremity ([place]) 07/12/2019   Dyslipidemia    ESRD (end stage renal disease) ([place])    Reports stage IV CKD, Has not started dialysis d/t inability to communicate w/ nephrologist on his first appointment   Headache 08/2019   Hypertension    ICD (implantable cardioverter-defibrillator) in place    Insomnia 08/2019   NICM (nonischemic cardiomyopathy) ([place]) 09/15/2016   Pancreatitis    Renal disorder    Sleep apnea    Vitamin D deficiency 08/2019    Patient Active Problem List   Diagnosis Date Noted   Subcortical infarction ([place]) 10/03/2020   Cerebral thrombosis with cerebral infarction  10/01/2020   Unilateral weakness 09/30/2020   History of atrial fibrillation 09/30/2020   History of DVT (deep vein thrombosis) 09/30/2020   CKD (chronic kidney disease) stage 3, GFR 30-59 ml/min (HCC) 07/08/2020   Acute CHF (congestive heart failure) ([place]) 07/07/2020   CKD (chronic kidney disease), stage IV ([place]) 07/07/2020   ICD (implantable cardioverter-defibrillator) in place 05/14/2020   Pulmonary embolism ([place]) 02/05/2020   Acute kidney injury superimposed on CKD ([place])    Chronic combined systolic and diastolic heart failure (HCC)    Atrial fibrillation with RVR ([place]) 02/01/2020   NSVT (nonsustained ventricular tachycardia) 02/01/2020   Acute GI bleeding 02/01/2020   DKA (diabetic ketoacidosis) ([place]) 02/01/2020   Bilateral deafness 08/15/2019   Type 2 diabetes mellitus with complication, with long-term current use of insulin ([place]) 08/15/2019   Hemoglobin A1C between 7% and 9% indicating borderline diabetic control ([place]) 08/15/2019   Insomnia 08/15/2019   Abdominal pain 11/14/2018   Ischemic cardiomyopathy 10/14/2016   NICM (nonischemic cardiomyopathy) ([place]) 22/05/5425   Chronic systolic CHF (congestive heart failure) ([place]) 05/30/2016   HTN (hypertension) 05/30/2016   Deaf 05/30/2016   Hyperkalemia 05/30/2016   Major depressive disorder, recurrent episode ([place]) 03/21/2016   Bronchitis 01/21/2015   Dyslipidemia 01/21/2015   Vitamin D deficiency 12/19/2011   Personal history of other infectious and parasitic diseases 10/25/2011   Anxiety disorder 05/12/2011   Hyperlipidemia 05/12/2011  Hypertriglyceridemia 05/12/2011   Obstructive sleep apnea 05/12/2011    Past Surgical History:  Procedure Laterality Date   ATRIAL FIBRILLATION ABLATION N/A 03/02/2021   Procedure: ATRIAL FIBRILLATION ABLATION;  Surgeon: Constance Haw, MD;  Location: [facility];  Service: Cardiovascular;  Laterality: N/A;   BACK SURGERY     CARDIAC CATHETERIZATION      ESOPHAGOGASTRODUODENOSCOPY (EGD) WITH PROPOFOL N/A 02/02/2020   Procedure: ESOPHAGOGASTRODUODENOSCOPY (EGD) WITH PROPOFOL;  Surgeon: Arta Silence, MD;  Location: [facility];  Service: Endoscopy;  Laterality: N/A;   ICD IMPLANT  10/14/2016   ICD IMPLANT N/A 10/14/2016   Procedure: ICD Implant;  Surgeon: Deboraha Sprang, MD;  Location: [facility];  Service: Cardiovascular;  Laterality: N/A;   PRESSURE SENSOR/CARDIOMEMS N/A 09/10/2020   Procedure: PRESSURE SENSOR/CARDIOMEMS;  Surgeon: Larey Dresser, MD;  Location: [facility];  Service: Cardiovascular;  Laterality: N/A;   RIGHT HEART CATH N/A 05/21/2020   Procedure: RIGHT HEART CATH;  Surgeon: Larey Dresser, MD;  Location: [facility];  Service: Cardiovascular;  Laterality: N/A;   WRIST SURGERY         Family History  Problem Relation Age of Onset   Hypertension Mother    Healthy Father    Stroke Father     Social History   Tobacco Use   Smoking status: Never   Smokeless tobacco: Never  Vaping Use   Vaping Use: Never used  Substance Use Topics   Alcohol use: Not Currently    Alcohol/week: 1.0 - 2.0 standard drink    Types: 1 - 2 Glasses of wine per week    Comment: rare   Drug use: No    Home Medications Prior to Admission medications   Medication Sig Start Date End Date Taking? Authorizing Provider  acetaminophen (TYLENOL) 325 MG tablet Take 2 tablets (650 mg total) by mouth every 4 (four) hours as needed for mild pain (or temp > 37.5 C (99.5 F)). 10/07/20   Angiulli, Lavon Paganini, PA-C  amiodarone (PACERONE) 200 MG tablet Take 1 tablet (200 mg total) by mouth 2 (two) times daily. Patient taking differently: Take 200 mg by mouth daily. 10/07/20   Angiulli, Lavon Paganini, PA-C  apixaban (ELIQUIS) 5 MG TABS tablet TAKE 1 TABLET(5 MG) BY MOUTH TWICE DAILY 10/07/20   Angiulli, Lavon Paganini, PA-C  atorvastatin (LIPITOR) 80 MG tablet Take 1 tablet (80 mg total) by mouth at bedtime. 10/07/20   Angiulli, Lavon Paganini, PA-C   carvedilol (COREG) 25 MG tablet TAKE 1 TABLET (25 MG TOTAL) BY MOUTH 2 (TWO) TIMES DAILY. 01/04/21 01/04/22  Lyda Jester M, PA-C  dapagliflozin propanediol (FARXIGA) 10 MG TABS tablet Take 1 tablet (10 mg total) by mouth daily before breakfast. 10/27/20   Lyda Jester M, PA-C  Evolocumab (REPATHA SURECLICK) 644 MG/ML SOAJ Inject 140 mg into the skin every 14 (fourteen) days. 12/29/20   Larey Dresser, MD  fenofibrate 54 MG tablet Take 54 mg by mouth daily.    [provider]  furosemide (LASIX) 80 MG tablet Take 1 tablet (80 mg total) by mouth 2 (two) times daily. 02/17/21 04/18/21  Clegg, Amy D, NP  hydrALAZINE (APRESOLINE) 100 MG tablet Take 100 mg by mouth 3 (three) times daily.    [provider]  insulin glargine (LANTUS SOLOSTAR) 100 UNIT/ML Solostar Pen Inject 35 Units into the skin at bedtime. 01/15/21 01/15/22  Vevelyn Francois, NP  Insulin Pen Needle (PEN NEEDLES) 31G X 6 MM  MISC 1 pen by Does not apply route daily. 01/15/21 01/15/22  Vevelyn Francois, NP  isosorbide mononitrate (IMDUR) 60 MG 24 hr tablet Take 1 tablet (60 mg total) by mouth daily. 10/07/20   Angiulli, Lavon Paganini, PA-C  latanoprost (XALATAN) 0.005 % ophthalmic solution Place 1 drop into both eyes at bedtime. 01/20/20   [provider]  meclizine (ANTIVERT) 25 MG tablet Take 1 tablet (25 mg total) by mouth 3 (three) times daily as needed for dizziness. Patient taking differently: Take 25 mg by mouth 3 (three) times daily. 10/07/20   Angiulli, Lavon Paganini, PA-C  metolazone (ZAROXOLYN) 2.5 MG tablet Take 1 tablet (2.5 mg total) by mouth as directed. Per Heart Failure Team instructions. Patient not taking: Reported on 03/10/2021 02/17/21 05/18/21  Darrick Grinder D, NP  metoprolol tartrate (LOPRESSOR) 100 MG tablet Take 1 tablet (100 mg total) two hours prior to CT testing. Patient not taking: Reported on 02/10/2021 02/04/21   Constance Haw, MD  pantoprazole (PROTONIX) 40 MG tablet Take 1 tablet (40 mg  total) by mouth 2 (two) times daily. 10/07/20   Angiulli, Lavon Paganini, PA-C  sertraline (ZOLOFT) 50 MG tablet TAKE 1 TABLET (50 MG TOTAL) BY MOUTH DAILY. 01/14/21   Vevelyn Francois, NP  VASCEPA 1 g capsule Take 2 capsules (2 g total) by mouth 2 (two) times daily. 10/07/20   Angiulli, Lavon Paganini, PA-C    Allergies    Lisinopril  Review of Systems   Review of Systems  Respiratory:  Positive for cough and shortness of breath.   All other systems reviewed and are negative.  Physical Exam Updated Vital Signs BP (!) 136/117   Pulse 75   Temp 98.8 F (37.1 C) (Oral)   Resp 18   Ht 1.778 m (5\' 10" )   Wt 108.9 kg   SpO2 99%   BMI 34.44 kg/m   Physical Exam Vitals and nursing note reviewed.  Constitutional:      General: He is not in acute distress.    Appearance: He is well-developed.  HENT:     Head: Normocephalic and atraumatic.     Right Ear: External ear normal.     Left Ear: External ear normal.  Eyes:     General: No scleral icterus.       Right eye: No discharge.        Left eye: No discharge.     Conjunctiva/sclera: Conjunctivae normal.  Neck:     Trachea: No tracheal deviation.  Cardiovascular:     Rate and Rhythm: Normal rate and regular rhythm.  Pulmonary:     Effort: Pulmonary effort is normal. No respiratory distress.     Breath sounds: Normal breath sounds. No stridor. No wheezing or rales.  Abdominal:     General: Bowel sounds are normal. There is no distension.     Palpations: Abdomen is soft.     Tenderness: There is no abdominal tenderness. There is no guarding or rebound.  Musculoskeletal:        General: No tenderness or deformity.     Cervical back: Neck supple.     Right lower leg: No edema.     Left lower leg: No edema.  Skin:    General: Skin is warm and dry.     Findings: No rash.  Neurological:     General: No focal deficit present.     Mental Status: He is alert.     Cranial Nerves: No cranial nerve deficit (no  facial droop, extraocular  movements intact, no slurred speech).     Sensory: No sensory deficit.     Motor: No abnormal muscle tone or seizure activity.     Coordination: Coordination normal.  Psychiatric:        Mood and Affect: Mood normal.    ED Results / Procedures / Treatments   Labs (all labs ordered are listed, but only abnormal results are displayed) Labs Reviewed  BASIC METABOLIC PANEL - Abnormal; Notable for the following components:      Result Value   Potassium 5.3 (*)    Glucose, Bld 274 (*)    BUN 25 (*)    Creatinine, Ser 2.55 (*)    GFR, Estimated 29 (*)    All other components within normal limits  BRAIN NATRIURETIC PEPTIDE - Abnormal; Notable for the following components:   B Natriuretic Peptide 501.6 (*)    All other components within normal limits  CBC - Abnormal; Notable for the following components:   Hemoglobin 12.6 (*)    MCHC 29.8 (*)    nRBC 0.4 (*)    All other components within normal limits  TROPONIN I (HIGH SENSITIVITY) - Abnormal; Notable for the following components:   Troponin I (High Sensitivity) 31 (*)    All other components within normal limits  RESP PANEL BY RT-PCR (FLU A&B, COVID) ARPGX2  MAGNESIUM    EKG EKG Interpretation  Date/Time:  Monday March 15 2021 08:06:25 EST Ventricular Rate:  76 PR Interval:  205 QRS Duration: 90 QT Interval:  446 QTC Calculation: 502 R Axis:   89 Text Interpretation: Sinus rhythm Borderline prolonged PR interval LAE, consider biatrial enlargement Anterior Q waves, possibly due to LVH Prolonged QT interval No significant change since last tracing Confirmed by Dorie Rank [phone]) on 03/15/2021 8:26:21 AM  Radiology DG Chest 2 View  Result Date: 03/15/2021 CLINICAL DATA:  Shortness of breath, productive cough EXAM: CHEST - 2 VIEW COMPARISON:  09/29/2020 FINDINGS: Left-sided implanted cardiac device remains in place. Heart size is mildly enlarged, unchanged. Low lung volumes. Mild pulmonary vascular congestion. No focal  airspace consolidation, pleural effusion, or pneumothorax. IMPRESSION: Mild pulmonary vascular congestion without overt edema. Electronically Signed   By: Davina Poke D.O.   On: 03/15/2021 08:28    Procedures Procedures   Medications Ordered in ED Medications  albuterol (VENTOLIN HFA) 108 (90 Base) MCG/ACT inhaler 2 puff (2 puffs Inhalation Given 03/15/21 0909)  nitroGLYCERIN (NITROGLYN) 2 % ointment 1 inch (1 inch Topical Given 03/15/21 0910)  furosemide (LASIX) injection 40 mg (40 mg Intravenous Given 03/15/21 0908)  potassium chloride SA (KLOR-CON M) CR tablet 20 mEq (20 mEq Oral Given 03/15/21 0908)  hydrALAZINE (APRESOLINE) injection 20 mg (20 mg Intravenous Given 03/15/21 0907)  carvedilol (COREG) tablet 25 mg (25 mg Oral Given 03/15/21 0908)  sodium zirconium cyclosilicate (LOKELMA) packet 10 g (10 g Oral Given 03/15/21 1100)    ED Course  I have reviewed the triage vital signs and the nursing notes.  Pertinent labs & imaging results that were available during my care of the patient were reviewed by me and considered in my medical decision making (see chart for details).  Clinical Course as of 03/15/21 1158  Mon Mar 15, 2021  8341 Chest x-ray shows mild pulmonary vascular congestion without overt edema [JK]  1023 Troponin elevated but similar to previous values [JK]  9622 Basic metabolic panel(!) Creatinine increased but similar to previous values [JK]  1023 CBC normal [JK]  1156  Patient is feeling much better now.  His breathing has improved.  Blood pressure is significantly better [JK]    Clinical Course User Index [JK] Dorie Rank, MD   MDM Rules/Calculators/A&P                           Patient presented to the ER with complaints of shortness of breath.  Patient has known history of congestive heart failure.  Patient did not take his medications last evening.  In the ED the patient was significantly hypertensive.  I was concerned about the possibility of CHF, pneumonia or  acute coronary syndrome.  No signs of COVID or flu.  X-ray did not show any signs of pneumonia or significant pulmonary edema.  Patient's labs were notable for an elevation in the BNP as well as troponin.  There is slight increase in his potassium.  These findings are related to his CHF although do not seem significantly different than baseline.  Patient was given a dose of Lokelma and also dose of Lasix.  Patient is feeling much better now.  His breathing is easy and not labored.  He has a normal oxygen saturation is not tachypneic.  I think he is a good candidate for close outpatient follow-up.  Patient will need to have his potassium level rechecked.  I also instructed him to follow-up closely with his cardiology heart failure doctors. Final Clinical Impression(s) / ED Diagnoses Final diagnoses:  Hypertensive urgency  Chronic congestive heart failure, unspecified heart failure type [facility])    Rx / DC Orders ED Discharge Orders     None        Dorie Rank, MD 03/15/21 1158

## 2021-03-17 ENCOUNTER — Telehealth: Payer: Self-pay

## 2021-03-17 ENCOUNTER — Telehealth (HOSPITAL_COMMUNITY): Payer: Self-pay

## 2021-03-17 NOTE — Telephone Encounter (Signed)
Sertraline

## 2021-03-17 NOTE — Telephone Encounter (Signed)
I had to contact pts PCP office about his refill request of sertraline. The pharmacy has tried a couple times already to get it and no response.  I had to LVM at that office.    [name], EMT-Paramedic  03/17/21

## 2021-03-18 ENCOUNTER — Other Ambulatory Visit: Payer: Self-pay | Admitting: Nurse Practitioner

## 2021-03-18 MED ORDER — SERTRALINE HCL 50 MG PO TABS: 50.0000 mg | ORAL_TABLET | Freq: Every day | ORAL | 1 refills | Status: DC

## 2021-03-22 ENCOUNTER — Ambulatory Visit (INDEPENDENT_AMBULATORY_CARE_PROVIDER_SITE_OTHER): Payer: Medicare Other

## 2021-03-22 DIAGNOSIS — I428 Other cardiomyopathies: Secondary | ICD-10-CM

## 2021-03-22 LAB — CUP PACEART REMOTE DEVICE CHECK
Battery Remaining Longevity: 144 mo
Battery Remaining Percentage: 100 %
Brady Statistic RV Percent Paced: 0 %
Date Time Interrogation Session: 20221212005400
HighPow Impedance: 70 Ohm
Implantable Lead Implant Date: 20180706
Implantable Lead Location: 753860
Implantable Lead Model: 293
Implantable Lead Serial Number: 432676
Implantable Pulse Generator Implant Date: 20180706
Lead Channel Impedance Value: 415 Ohm
Lead Channel Setting Pacing Amplitude: 2.5 V
Lead Channel Setting Pacing Pulse Width: 0.4 ms
Lead Channel Setting Sensing Sensitivity: 0.5 mV
Pulse Gen Serial Number: 236570

## 2021-03-23 ENCOUNTER — Telehealth (HOSPITAL_COMMUNITY): Payer: Self-pay

## 2021-03-23 DIAGNOSIS — N184 Chronic kidney disease, stage 4 (severe): Secondary | ICD-10-CM | POA: Diagnosis not present

## 2021-03-23 DIAGNOSIS — E1122 Type 2 diabetes mellitus with diabetic chronic kidney disease: Secondary | ICD-10-CM | POA: Diagnosis not present

## 2021-03-23 DIAGNOSIS — I5042 Chronic combined systolic (congestive) and diastolic (congestive) heart failure: Secondary | ICD-10-CM | POA: Diagnosis not present

## 2021-03-23 DIAGNOSIS — R809 Proteinuria, unspecified: Secondary | ICD-10-CM | POA: Diagnosis not present

## 2021-03-23 DIAGNOSIS — N179 Acute kidney failure, unspecified: Secondary | ICD-10-CM | POA: Diagnosis not present

## 2021-03-23 DIAGNOSIS — I129 Hypertensive chronic kidney disease with stage 1 through stage 4 chronic kidney disease, or unspecified chronic kidney disease: Secondary | ICD-10-CM | POA: Diagnosis not present

## 2021-03-23 DIAGNOSIS — N2581 Secondary hyperparathyroidism of renal origin: Secondary | ICD-10-CM | POA: Diagnosis not present

## 2021-03-23 DIAGNOSIS — H9193 Unspecified hearing loss, bilateral: Secondary | ICD-10-CM | POA: Diagnosis not present

## 2021-03-23 DIAGNOSIS — E1129 Type 2 diabetes mellitus with other diabetic kidney complication: Secondary | ICD-10-CM | POA: Diagnosis not present

## 2021-03-23 NOTE — Telephone Encounter (Signed)
The provider from [facility], dr forster, contacted me this morning reporting she seen him office today and he looked euvolemic on exam today. No changes with diuretics but she is adding Vit D 1000U daily.   I will f/u tomor for med rec.   [name], EMT-Paramedic  03/23/21

## 2021-03-24 ENCOUNTER — Other Ambulatory Visit (HOSPITAL_COMMUNITY): Payer: Self-pay

## 2021-03-24 NOTE — Progress Notes (Signed)
Paramedicine Encounter    Patient ID: Troy Rush, male    DOB: May 24, 1970, 50 y.o.   MRN: [id number]   Patient Care Team: Vevelyn Francois, NP as PCP - General (Adult Health Nurse Practitioner) Larey Dresser, MD as PCP - Cardiology (Cardiology) Conrad Elliott, NP as Nurse Practitioner (Cardiology) Larey Dresser, MD as Consulting Physician (Cardiology) Jorge Ny, LCSW as Social Worker (Licensed Clinical Social Worker)  Patient Active Problem List   Diagnosis Date Noted   Subcortical infarction [facility]) 10/03/2020   Cerebral thrombosis with cerebral infarction 10/01/2020   Unilateral weakness 09/30/2020   History of atrial fibrillation 09/30/2020   History of DVT (deep vein thrombosis) 09/30/2020   CKD (chronic kidney disease) stage 3, GFR 30-59 ml/min ([place]) 07/08/2020   Acute CHF (congestive heart failure) ([place]) 07/07/2020   CKD (chronic kidney disease), stage IV ([place]) 07/07/2020   ICD (implantable cardioverter-defibrillator) in place 05/14/2020   Pulmonary embolism ([place]) 02/05/2020   Acute kidney injury superimposed on CKD ([place])    Chronic combined systolic and diastolic heart failure ([place])    Atrial fibrillation with RVR ([place]) 02/01/2020   NSVT (nonsustained ventricular tachycardia) 02/01/2020   Acute GI bleeding 02/01/2020   DKA (diabetic ketoacidosis) ([place]) 02/01/2020   Bilateral deafness 08/15/2019   Type 2 diabetes mellitus with complication, with long-term current use of insulin ([place]) 08/15/2019   Hemoglobin A1C between 7% and 9% indicating borderline diabetic control ([place]) 08/15/2019   Insomnia 08/15/2019   Abdominal pain 11/14/2018   Ischemic cardiomyopathy 10/14/2016   NICM (nonischemic cardiomyopathy) ([place]) 93/23/5573   Chronic systolic CHF (congestive heart failure) ([place]) 05/30/2016   HTN (hypertension) 05/30/2016   Deaf 05/30/2016   Hyperkalemia 05/30/2016   Major depressive disorder, recurrent episode ([place]) 03/21/2016   Bronchitis 01/21/2015    Dyslipidemia 01/21/2015   Vitamin D deficiency 12/19/2011   Personal history of other infectious and parasitic diseases 10/25/2011   Anxiety disorder 05/12/2011   Hyperlipidemia 05/12/2011   Hypertriglyceridemia 05/12/2011   Obstructive sleep apnea 05/12/2011    Current Outpatient Medications:    acetaminophen (TYLENOL) 325 MG tablet, Take 2 tablets (650 mg total) by mouth every 4 (four) hours as needed for mild pain (or temp > 37.5 C (99.5 F))., Disp: , Rfl:    amiodarone (PACERONE) 200 MG tablet, Take 1 tablet (200 mg total) by mouth 2 (two) times daily. (Patient taking differently: Take 200 mg by mouth daily.), Disp: 60 tablet, Rfl: 6   apixaban (ELIQUIS) 5 MG TABS tablet, TAKE 1 TABLET(5 MG) BY MOUTH TWICE DAILY, Disp: 60 tablet, Rfl: 11   atorvastatin (LIPITOR) 80 MG tablet, Take 1 tablet (80 mg total) by mouth at bedtime., Disp: 30 tablet, Rfl: 11   carvedilol (COREG) 25 MG tablet, TAKE 1 TABLET (25 MG TOTAL) BY MOUTH 2 (TWO) TIMES DAILY., Disp: 60 tablet, Rfl: 0   dapagliflozin propanediol (FARXIGA) 10 MG TABS tablet, Take 1 tablet (10 mg total) by mouth daily before breakfast., Disp: 90 tablet, Rfl: 3   Evolocumab (REPATHA SURECLICK) 220 MG/ML SOAJ, Inject 140 mg into the skin every 14 (fourteen) days., Disp: 2 mL, Rfl: 11   fenofibrate 54 MG tablet, Take 54 mg by mouth daily., Disp: , Rfl:    furosemide (LASIX) 80 MG tablet, Take 1 tablet (80 mg total) by mouth 2 (two) times daily., Disp: 60 tablet, Rfl: 6   hydrALAZINE (APRESOLINE) 100 MG tablet, Take 100 mg by mouth 3 (three) times daily., Disp: , Rfl:  insulin glargine (LANTUS SOLOSTAR) 100 UNIT/ML Solostar Pen, Inject 35 Units into the skin at bedtime., Disp: 31.5 mL, Rfl: 3   Insulin Pen Needle (PEN NEEDLES) 31G X 6 MM MISC, 1 pen by Does not apply route daily., Disp: 100 each, Rfl: 11   isosorbide mononitrate (IMDUR) 60 MG 24 hr tablet, Take 1 tablet (60 mg total) by mouth daily., Disp: 30 tablet, Rfl: 11   latanoprost  (XALATAN) 0.005 % ophthalmic solution, Place 1 drop into both eyes at bedtime., Disp: , Rfl:    meclizine (ANTIVERT) 25 MG tablet, Take 1 tablet (25 mg total) by mouth 3 (three) times daily as needed for dizziness. (Patient taking differently: Take 25 mg by mouth 3 (three) times daily.), Disp: 90 tablet, Rfl: 3   metolazone (ZAROXOLYN) 2.5 MG tablet, Take 1 tablet (2.5 mg total) by mouth as directed. Per Heart Failure Team instructions. (Patient not taking: Reported on 03/10/2021), Disp: 6 tablet, Rfl: 3   metoprolol tartrate (LOPRESSOR) 100 MG tablet, Take 1 tablet (100 mg total) two hours prior to CT testing. (Patient not taking: Reported on 02/10/2021), Disp: 1 tablet, Rfl: 0   pantoprazole (PROTONIX) 40 MG tablet, Take 1 tablet (40 mg total) by mouth 2 (two) times daily., Disp: 180 tablet, Rfl: 3   sertraline (ZOLOFT) 50 MG tablet, Take 1 tablet (50 mg total) by mouth daily., Disp: 90 tablet, Rfl: 1   VASCEPA 1 g capsule, Take 2 capsules (2 g total) by mouth 2 (two) times daily., Disp: 120 capsule, Rfl: 5 Allergies  Allergen Reactions   Lisinopril Cough      Social History   Socioeconomic History   Marital status: Legally Separated    Spouse name: Not on file   Number of children: Not on file   Years of education: Not on file   Highest education level: Not on file  Occupational History   Not on file  Tobacco Use   Smoking status: Never   Smokeless tobacco: Never  Vaping Use   Vaping Use: Never used  Substance and Sexual Activity   Alcohol use: Not Currently    Alcohol/week: 1.0 - 2.0 standard drink    Types: 1 - 2 Glasses of wine per week    Comment: rare   Drug use: No   Sexual activity: Yes    Birth control/protection: None  Other Topics Concern   Not on file  Social History Narrative   Not on file   Social Determinants of Health   Financial Resource Strain: Not on file  Food Insecurity: Not on file  Transportation Needs: Not on file  Physical Activity: Not on file   Stress: Not on file  Social Connections: Not on file  Intimate Partner Violence: Not on file    Physical Exam      Future Appointments  Date Time Provider [place]  03/30/2021 11:30 AM Sherran Needs, NP [facility] None  04/09/2021 11:40 AM Larey Dresser, MD [facility] None  04/19/2021 11:00 AM Vevelyn Francois, NP [place] None  05/27/2021  8:45 AM Frann Rider, NP [facility] None  06/08/2021  8:30 AM Constance Haw, MD [facility]  06/21/2021  8:35 AM [facility] DEVICE REMOTES [facility]  09/20/2021  8:35 AM [facility] DEVICE REMOTES [facility]  12/20/2021  8:35 AM [facility] DEVICE REMOTES [facility]    BP (!) 98/52    Pulse 80    Resp 18    Wt 240 lb (108.9 kg)    BMI 34.44  kg/m   Weight yesterday-? Last visit weight-240  Pt went to kidney doc yesterday-no med changes to diuretics, she is adding 1000U of VitD.  He needs to get that OTC.   He missed a few doses of his meds the past 2 wks.  Had to hurry for visit this morning b/c he is leaving for work in a few min.  He went to ER last week for HTN, looks like he had been a few days without his meds.   Refills needed: Farxiga  fenofibrate Lasix Repatha insulin Hydralazine-he is short hydralazine in wed, thurs, fri sat PM dose only in the large pill box.   Ordered hydralazine- Meds verified and 2 wks worth refilled.  Will see him next week to place the hydralazine where it needs to go.  He denies any complaints other than feeling tired.  Denies sob, no dizziness.  Someone called him regarding CPAP. He reports he has appoint on 12/20 for that.  I did remind him that he has afib clinic on same day.    [name], [facility] Paramedic  03/24/21

## 2021-03-29 ENCOUNTER — Telehealth (HOSPITAL_COMMUNITY): Payer: Self-pay

## 2021-03-29 DIAGNOSIS — Z20822 Contact with and (suspected) exposure to covid-19: Secondary | ICD-10-CM | POA: Diagnosis not present

## 2021-03-29 NOTE — Telephone Encounter (Signed)
[place] to order the following for refills:  Farxiga  fenofibrate Lasix Repatha Insulin  Will p/u next week.   [name], EMT-Paramedic  03/29/21

## 2021-03-30 ENCOUNTER — Encounter (HOSPITAL_COMMUNITY): Payer: Self-pay | Admitting: Nurse Practitioner

## 2021-03-30 ENCOUNTER — Ambulatory Visit (HOSPITAL_COMMUNITY)
Admission: RE | Admit: 2021-03-30 | Discharge: 2021-03-30 | Disposition: A | Discharge status: Home / Self Care | Payer: Medicare Other | Source: Ambulatory Visit | Attending: Nurse Practitioner | Admitting: Nurse Practitioner

## 2021-03-30 ENCOUNTER — Other Ambulatory Visit: Payer: Self-pay

## 2021-03-30 VITALS — BP 180/102 | HR 68 | Ht 70.0 in | Wt 245.0 lb

## 2021-03-30 DIAGNOSIS — Z8673 Personal history of transient ischemic attack (TIA), and cerebral infarction without residual deficits: Secondary | ICD-10-CM | POA: Diagnosis not present

## 2021-03-30 DIAGNOSIS — I132 Hypertensive heart and chronic kidney disease with heart failure and with stage 5 chronic kidney disease, or end stage renal disease: Secondary | ICD-10-CM | POA: Diagnosis not present

## 2021-03-30 DIAGNOSIS — Z7901 Long term (current) use of anticoagulants: Secondary | ICD-10-CM | POA: Insufficient documentation

## 2021-03-30 DIAGNOSIS — H913 Deaf nonspeaking, not elsewhere classified: Secondary | ICD-10-CM | POA: Insufficient documentation

## 2021-03-30 DIAGNOSIS — D6869 Other thrombophilia: Secondary | ICD-10-CM | POA: Diagnosis not present

## 2021-03-30 DIAGNOSIS — Z79899 Other long term (current) drug therapy: Secondary | ICD-10-CM | POA: Insufficient documentation

## 2021-03-30 DIAGNOSIS — E1122 Type 2 diabetes mellitus with diabetic chronic kidney disease: Secondary | ICD-10-CM | POA: Insufficient documentation

## 2021-03-30 DIAGNOSIS — N186 End stage renal disease: Secondary | ICD-10-CM | POA: Insufficient documentation

## 2021-03-30 DIAGNOSIS — I4819 Other persistent atrial fibrillation: Secondary | ICD-10-CM | POA: Diagnosis not present

## 2021-03-30 DIAGNOSIS — Z95810 Presence of automatic (implantable) cardiac defibrillator: Secondary | ICD-10-CM | POA: Diagnosis not present

## 2021-03-30 DIAGNOSIS — I509 Heart failure, unspecified: Secondary | ICD-10-CM | POA: Diagnosis not present

## 2021-03-30 DIAGNOSIS — N184 Chronic kidney disease, stage 4 (severe): Secondary | ICD-10-CM | POA: Diagnosis not present

## 2021-03-30 MED ORDER — AMIODARONE HCL 200 MG PO TABS: 200.0000 mg | ORAL_TABLET | Freq: Every day | ORAL | 6 refills | Status: DC

## 2021-03-30 NOTE — Progress Notes (Signed)
Primary Care Physician: Vevelyn Francois, NP Referring Physician:Dr. Alma Downs Troy Rush is a 50 y.o. male with a h/o ICD, CHF,DM, CVA, afib that is in the clinic for one month s/p ablation. He is deaf and is here with an interpretor. He does not feel that he has had any afib. He continues on amiodarone 200 mg bid. He continues on his eliquis  without interruption. No swallowing or groin issues. He is back to his usual activities. BP elevated here but at home this am it was 130/80 per pt.   Today, he denies symptoms of palpitations, chest pain, shortness of breath, orthopnea, PND, lower extremity edema, dizziness, presyncope, syncope, or neurologic sequela. The patient is tolerating medications without difficulties and is otherwise without complaint today.   Past Medical History:  Diagnosis Date   AICD (automatic cardioverter/defibrillator) present 10/14/2016   Bilateral deafness    Bronchitis    CHF (congestive heart failure) ([place])    Depression    Diabetes mellitus without complication ([place])    DVT, lower extremity ([place]) 07/12/2019   Dyslipidemia    ESRD (end stage renal disease) ([place])    Reports stage IV CKD, Has not started dialysis d/t inability to communicate w/ nephrologist on his first appointment   Headache 08/2019   Hypertension    ICD (implantable cardioverter-defibrillator) in place    Insomnia 08/2019   NICM (nonischemic cardiomyopathy) ([place]) 09/15/2016   Pancreatitis    Renal disorder    Sleep apnea    Vitamin D deficiency 08/2019   Past Surgical History:  Procedure Laterality Date   ATRIAL FIBRILLATION ABLATION N/A 03/02/2021   Procedure: ATRIAL FIBRILLATION ABLATION;  Surgeon: Constance Haw, MD;  Location: [facility];  Service: Cardiovascular;  Laterality: N/A;   BACK SURGERY     CARDIAC CATHETERIZATION     ESOPHAGOGASTRODUODENOSCOPY (EGD) WITH PROPOFOL N/A 02/02/2020   Procedure: ESOPHAGOGASTRODUODENOSCOPY (EGD) WITH PROPOFOL;  Surgeon:  Arta Silence, MD;  Location: [facility];  Service: Endoscopy;  Laterality: N/A;   ICD IMPLANT  10/14/2016   ICD IMPLANT N/A 10/14/2016   Procedure: ICD Implant;  Surgeon: Deboraha Sprang, MD;  Location: [facility];  Service: Cardiovascular;  Laterality: N/A;   PRESSURE SENSOR/CARDIOMEMS N/A 09/10/2020   Procedure: PRESSURE SENSOR/CARDIOMEMS;  Surgeon: Larey Dresser, MD;  Location: [facility];  Service: Cardiovascular;  Laterality: N/A;   RIGHT HEART CATH N/A 05/21/2020   Procedure: RIGHT HEART CATH;  Surgeon: Larey Dresser, MD;  Location: [facility];  Service: Cardiovascular;  Laterality: N/A;   WRIST SURGERY      Current Outpatient Medications  Medication Sig Dispense Refill   acetaminophen (TYLENOL) 325 MG tablet Take 2 tablets (650 mg total) by mouth every 4 (four) hours as needed for mild pain (or temp > 37.5 C (99.5 F)).     apixaban (ELIQUIS) 5 MG TABS tablet TAKE 1 TABLET(5 MG) BY MOUTH TWICE DAILY 60 tablet 11   atorvastatin (LIPITOR) 80 MG tablet Take 1 tablet (80 mg total) by mouth at bedtime. 30 tablet 11   carvedilol (COREG) 25 MG tablet TAKE 1 TABLET (25 MG TOTAL) BY MOUTH 2 (TWO) TIMES DAILY. 60 tablet 0   dapagliflozin propanediol (FARXIGA) 10 MG TABS tablet Take 1 tablet (10 mg total) by mouth daily before breakfast. 90 tablet 3   Evolocumab (REPATHA SURECLICK) 620 MG/ML SOAJ Inject 140 mg into the skin every 14 (fourteen) days. 2 mL 11  fenofibrate 54 MG tablet Take 54 mg by mouth daily.     furosemide (LASIX) 80 MG tablet Take 1 tablet (80 mg total) by mouth 2 (two) times daily. 60 tablet 6   hydrALAZINE (APRESOLINE) 100 MG tablet Take 100 mg by mouth 3 (three) times daily.     insulin glargine (LANTUS SOLOSTAR) 100 UNIT/ML Solostar Pen Inject 35 Units into the skin at bedtime. 31.5 mL 3   Insulin Pen Needle (PEN NEEDLES) 31G X 6 MM MISC 1 pen by Does not apply route daily. 100 each 11   isosorbide mononitrate (IMDUR) 60 MG 24 hr tablet Take 1  tablet (60 mg total) by mouth daily. 30 tablet 11   latanoprost (XALATAN) 0.005 % ophthalmic solution Place 1 drop into both eyes at bedtime.     meclizine (ANTIVERT) 25 MG tablet Take 1 tablet (25 mg total) by mouth 3 (three) times daily as needed for dizziness. (Patient taking differently: Take 25 mg by mouth 3 (three) times daily.) 90 tablet 3   metolazone (ZAROXOLYN) 2.5 MG tablet Take 1 tablet (2.5 mg total) by mouth as directed. Per Heart Failure Team instructions. 6 tablet 3   metoprolol tartrate (LOPRESSOR) 100 MG tablet Take 1 tablet (100 mg total) two hours prior to CT testing. 1 tablet 0   pantoprazole (PROTONIX) 40 MG tablet Take 1 tablet (40 mg total) by mouth 2 (two) times daily. 180 tablet 3   sertraline (ZOLOFT) 50 MG tablet Take 1 tablet (50 mg total) by mouth daily. 90 tablet 1   VASCEPA 1 g capsule Take 2 capsules (2 g total) by mouth 2 (two) times daily. 120 capsule 5   amiodarone (PACERONE) 200 MG tablet Take 1 tablet (200 mg total) by mouth daily. 60 tablet 6   No current facility-administered medications for this encounter.    Allergies  Allergen Reactions   Lisinopril Cough    Social History   Socioeconomic History   Marital status: Legally Separated    Spouse name: Not on file   Number of children: Not on file   Years of education: Not on file   Highest education level: Not on file  Occupational History   Not on file  Tobacco Use   Smoking status: Never   Smokeless tobacco: Never  Vaping Use   Vaping Use: Never used  Substance and Sexual Activity   Alcohol use: Not Currently    Alcohol/week: 1.0 - 2.0 standard drink    Types: 1 - 2 Glasses of wine per week    Comment: rare   Drug use: No   Sexual activity: Yes    Birth control/protection: None  Other Topics Concern   Not on file  Social History Narrative   Not on file   Social Determinants of Health   Financial Resource Strain: Not on file  Food Insecurity: Not on file  Transportation Needs:  Not on file  Physical Activity: Not on file  Stress: Not on file  Social Connections: Not on file  Intimate Partner Violence: Not on file    Family History  Problem Relation Age of Onset   Hypertension Mother    Healthy Father    Stroke Father     ROS- All systems are reviewed and negative except as per the HPI above  Physical Exam: Vitals:   03/30/21 1125  BP: (!) 180/102  Pulse: 68  SpO2: 98%  Weight: 111.1 kg  Height: 5\' 10"  (1.778 m)   Wt Readings from Last 3  Encounters:  03/30/21 111.1 kg  03/24/21 108.9 kg  03/15/21 108.9 kg    Labs: Lab Results  Component Value Date   NA 138 03/15/2021   K 5.3 (H) 03/15/2021   CL 105 03/15/2021   CO2 26 03/15/2021   GLUCOSE 274 (H) 03/15/2021   BUN 25 (H) 03/15/2021   CREATININE 2.55 (H) 03/15/2021   CALCIUM 8.9 03/15/2021   PHOS 3.9 02/04/2020   MG 2.3 03/15/2021   Lab Results  Component Value Date   INR 1.1 11/11/2020   Lab Results  Component Value Date   CHOL 120 02/25/2021   HDL 48 02/25/2021   LDLCALC 33 02/25/2021   TRIG 260 (H) 02/25/2021     GEN- The patient is well appearing, alert and oriented x 3 today.   Head- normocephalic, atraumatic Eyes-  Sclera clear, conjunctiva pink Ears- hearing intact Oropharynx- clear Neck- supple, no JVP Lymph- no cervical lymphadenopathy Lungs- Clear to ausculation bilaterally, normal work of breathing Heart- Regular rate and rhythm, no murmurs, rubs or gallops, PMI not laterally displaced GI- soft, NT, ND, + BS Extremities- no clubbing, cyanosis, or edema MS- no significant deformity or atrophy Skin- no rash or lesion Psych- euthymic mood, full affect Neuro- strength and sensation are intact  EKG-NSR at 68 bpm, pr int 214 ms , qrs int 96 ms, qtc 501 ms     Assessment and Plan:  1. Persistent afib Now s/p ablation 02/2221 In [facility]  No afib noted by pt  Will lower amiodarone to 200 mg daily, has been on 200 mg bid for some time as per med list   2.  CHA2DS2VASc of at least 5 Continue eliquis  5 mg bid without interruption   3, CHF Per Dr. Aundra Dubin, f/u pending 12/30  Appears normovolemic   4, HTN Elevated here Pt states controlled at home   F/u with Dr. Curt Bears 06/08/21  Geroge Baseman. Margee Trentham, [facility] [address] [place], [place] [address] [phone]

## 2021-03-30 NOTE — Patient Instructions (Signed)
Decrease Amiodarone 200mg  once daily

## 2021-03-31 ENCOUNTER — Other Ambulatory Visit (HOSPITAL_COMMUNITY): Payer: Self-pay

## 2021-03-31 NOTE — Progress Notes (Signed)
Paramedicine Encounter    Patient ID: Troy Rush, male    DOB: 11/17/1970, 50 y.o.   MRN: [id number]   Hydralazine, Zoloft and Repatha delivered.   Went out to place Hydralazine in appropriate places in the PM spots as directed by [name].   Repatha delivered, patient administered same at 1135 on 03/31/21.   [name] will follow up on her return.   Patient denied any other needs at this time.   Visit complete.  ACTION: Home visit completed

## 2021-04-01 NOTE — Progress Notes (Signed)
Remote ICD transmission.   

## 2021-04-05 ENCOUNTER — Emergency Department (HOSPITAL_COMMUNITY)
Admission: EM | Admit: 2021-04-05 | Discharge: 2021-04-06 | Disposition: A | Discharge status: Home / Self Care | Payer: Medicare Other | Attending: Emergency Medicine | Admitting: Emergency Medicine

## 2021-04-05 DIAGNOSIS — Z5321 Procedure and treatment not carried out due to patient leaving prior to being seen by health care provider: Secondary | ICD-10-CM | POA: Diagnosis not present

## 2021-04-05 DIAGNOSIS — R0602 Shortness of breath: Secondary | ICD-10-CM | POA: Diagnosis not present

## 2021-04-05 DIAGNOSIS — R059 Cough, unspecified: Secondary | ICD-10-CM | POA: Diagnosis not present

## 2021-04-05 DIAGNOSIS — R42 Dizziness and giddiness: Secondary | ICD-10-CM | POA: Diagnosis not present

## 2021-04-05 DIAGNOSIS — J811 Chronic pulmonary edema: Secondary | ICD-10-CM | POA: Diagnosis not present

## 2021-04-05 DIAGNOSIS — Z20822 Contact with and (suspected) exposure to covid-19: Secondary | ICD-10-CM | POA: Diagnosis not present

## 2021-04-05 DIAGNOSIS — R0989 Other specified symptoms and signs involving the circulatory and respiratory systems: Secondary | ICD-10-CM | POA: Diagnosis not present

## 2021-04-05 DIAGNOSIS — Z743 Need for continuous supervision: Secondary | ICD-10-CM | POA: Diagnosis not present

## 2021-04-05 DIAGNOSIS — R0789 Other chest pain: Secondary | ICD-10-CM | POA: Diagnosis not present

## 2021-04-05 DIAGNOSIS — R079 Chest pain, unspecified: Secondary | ICD-10-CM | POA: Diagnosis not present

## 2021-04-06 ENCOUNTER — Emergency Department (HOSPITAL_COMMUNITY): Payer: Medicare Other

## 2021-04-06 ENCOUNTER — Telehealth (HOSPITAL_COMMUNITY): Payer: Self-pay

## 2021-04-06 ENCOUNTER — Other Ambulatory Visit: Payer: Self-pay

## 2021-04-06 DIAGNOSIS — R0602 Shortness of breath: Secondary | ICD-10-CM | POA: Diagnosis not present

## 2021-04-06 DIAGNOSIS — R42 Dizziness and giddiness: Secondary | ICD-10-CM | POA: Diagnosis not present

## 2021-04-06 DIAGNOSIS — R059 Cough, unspecified: Secondary | ICD-10-CM | POA: Diagnosis not present

## 2021-04-06 DIAGNOSIS — J811 Chronic pulmonary edema: Secondary | ICD-10-CM | POA: Diagnosis not present

## 2021-04-06 LAB — CBC WITH DIFFERENTIAL/PLATELET
Abs Immature Granulocytes: 0.01 10*3/uL (ref 0.00–0.07)
Basophils Absolute: 0 10*3/uL (ref 0.0–0.1)
Basophils Relative: 1 %
Eosinophils Absolute: 0.1 10*3/uL (ref 0.0–0.5)
Eosinophils Relative: 2 %
HCT: 38.8 % — ABNORMAL LOW (ref 39.0–52.0)
Hemoglobin: 11.9 g/dL — ABNORMAL LOW (ref 13.0–17.0)
Immature Granulocytes: 0 %
Lymphocytes Relative: 32 %
Lymphs Abs: 1.6 10*3/uL (ref 0.7–4.0)
MCH: 28.2 pg (ref 26.0–34.0)
MCHC: 30.7 g/dL (ref 30.0–36.0)
MCV: 91.9 fL (ref 80.0–100.0)
Monocytes Absolute: 0.6 10*3/uL (ref 0.1–1.0)
Monocytes Relative: 12 %
Neutro Abs: 2.7 10*3/uL (ref 1.7–7.7)
Neutrophils Relative %: 53 %
Platelets: 213 10*3/uL (ref 150–400)
RBC: 4.22 MIL/uL (ref 4.22–5.81)
RDW: 13.2 % (ref 11.5–15.5)
WBC: 5 10*3/uL (ref 4.0–10.5)
nRBC: 0 % (ref 0.0–0.2)

## 2021-04-06 LAB — COMPREHENSIVE METABOLIC PANEL
ALT: 17 U/L (ref 0–44)
AST: 32 U/L (ref 15–41)
Albumin: 3.8 g/dL (ref 3.5–5.0)
Alkaline Phosphatase: 43 U/L (ref 38–126)
Anion gap: 10 (ref 5–15)
BUN: 39 mg/dL — ABNORMAL HIGH (ref 6–20)
CO2: 26 mmol/L (ref 22–32)
Calcium: 9 mg/dL (ref 8.9–10.3)
Chloride: 97 mmol/L — ABNORMAL LOW (ref 98–111)
Creatinine, Ser: 3.71 mg/dL — ABNORMAL HIGH (ref 0.61–1.24)
GFR, Estimated: 19 mL/min — ABNORMAL LOW (ref >60)
Glucose, Bld: 121 mg/dL — ABNORMAL HIGH (ref 70–99)
Potassium: 4.1 mmol/L (ref 3.5–5.1)
Sodium: 133 mmol/L — ABNORMAL LOW (ref 135–145)
Total Bilirubin: 1 mg/dL (ref 0.3–1.2)
Total Protein: 7.6 g/dL (ref 6.5–8.1)

## 2021-04-06 LAB — RESP PANEL BY RT-PCR (FLU A&B, COVID) ARPGX2
Influenza A by PCR: NEGATIVE
Influenza B by PCR: NEGATIVE
SARS Coronavirus 2 by RT PCR: NEGATIVE

## 2021-04-06 LAB — LIPASE, BLOOD: Lipase: 30 U/L (ref 11–51)

## 2021-04-06 NOTE — ED Provider Notes (Signed)
Emergency Medicine Provider Triage Evaluation Note  Troy Rush , a 50 y.o. male  was evaluated in triage.  Pt complains of not feeling well today with sxs including generalized weakness, N/V/D, cough, & dyspnea. Feels dizzy especially when he stands up, but has not passed out.   Communicated using pen & paper.   Review of Systems  Positive: N/v/d, dyspnea, cough, dizziness, weakness Negative: Fever, syncope, numbness, focal weakness, vision change, chest pain, abdominal pain.   Physical Exam  BP 103/61 (BP Location: Left Arm)    Pulse 71    Temp 99 F (37.2 C)    Resp 17    Ht 5\' 10"  (1.778 m)    Wt 109.3 kg    SpO2 98%    BMI 34.58 kg/m  Gen:   Awake, no distress   Resp:  Normal effort  MSK:   Moves extremities without difficulty  Other:  PERRL, EOMI without vertical or rotational nystagmus. 5/5 strength x 4. Intact finger to nose.   Medical Decision Making  Medically screening exam initiated at 12:12 AM.  Appropriate orders placed.  Sherrilee Gilles was informed that the remainder of the evaluation will be completed by another provider, this initial triage assessment does not replace that evaluation, and the importance of remaining in the ED until their evaluation is complete.  Dyspnea.    Amaryllis Dyke, PA-C 04/06/21 0014    Merryl Hacker, MD 04/06/21 [phone]

## 2021-04-06 NOTE — Telephone Encounter (Signed)
Pt reached out needing assistance with scheduling a ride for his appointment on Friday.  He reports he tried calling medicaid to sch but they were closed yesterday, if he calls today it is not the 3 days in advance so it wont sch for him.    [name], EMT-Paramedic  04/06/21

## 2021-04-06 NOTE — ED Triage Notes (Addendum)
Pt brought in for dizziness and shortness of breath. Pt is deaf and does not read lips or knowhow to do sign language. Pt communicates by writing everything out on paper.

## 2021-04-06 NOTE — ED Notes (Signed)
Pt stated he was tired of waiting has been waiting for 7 hours and wanted to go home.

## 2021-04-08 ENCOUNTER — Other Ambulatory Visit (HOSPITAL_COMMUNITY): Payer: Self-pay

## 2021-04-08 NOTE — Progress Notes (Addendum)
Paramedicine Encounter    Patient ID: Troy Rush, male    DOB: February 11, 1971, 50 y.o.   MRN: [id number]   BP 120/68    Pulse 70    Resp 18    Wt 240 lb (108.9 kg)    SpO2 97%    BMI 34.44 kg/m  Weight yesterday-? Last visit weight-245 @ clinic   Pt went to ER the other day for n/v. He left without being seen due to the long wait times.  He reports he feels better. No dizziness, no increased sob, no c/p. He now has his CPAP, using it daily. Also reports using his cardiomems.  Meds verified and 2 wks of meds filled however he is short farxiga and lasix in 2nd pill box.  Will get those refilled and will go back out next week.   Refills needed: Amio Farxiga Hydralazine Meclizine vascepa   Patient Care Team: Vevelyn Francois, NP as PCP - General (Adult Health Nurse Practitioner) Larey Dresser, MD as PCP - Cardiology (Cardiology) Conrad Badger, NP as Nurse Practitioner (Cardiology) Larey Dresser, MD as Consulting Physician (Cardiology) Jorge Ny, LCSW as Social Worker (Licensed Clinical Social Worker)  Patient Active Problem List   Diagnosis Date Noted   Subcortical infarction [facility]) 10/03/2020   Cerebral thrombosis with cerebral infarction 10/01/2020   Unilateral weakness 09/30/2020   History of atrial fibrillation 09/30/2020   History of DVT (deep vein thrombosis) 09/30/2020   CKD (chronic kidney disease) stage 3, GFR 30-59 ml/min ([place]) 07/08/2020   Acute CHF (congestive heart failure) ([place]) 07/07/2020   CKD (chronic kidney disease), stage IV ([place]) 07/07/2020   ICD (implantable cardioverter-defibrillator) in place 05/14/2020   Pulmonary embolism ([place]) 02/05/2020   Acute kidney injury superimposed on CKD ([place])    Chronic combined systolic and diastolic heart failure ([place])    Atrial fibrillation with RVR ([place]) 02/01/2020   NSVT (nonsustained ventricular tachycardia) 02/01/2020   Acute GI bleeding 02/01/2020   DKA (diabetic ketoacidosis) ([place]) 02/01/2020   Bilateral  deafness 08/15/2019   Type 2 diabetes mellitus with complication, with long-term current use of insulin ([place]) 08/15/2019   Hemoglobin A1C between 7% and 9% indicating borderline diabetic control ([place]) 08/15/2019   Insomnia 08/15/2019   Abdominal pain 11/14/2018   Ischemic cardiomyopathy 10/14/2016   NICM (nonischemic cardiomyopathy) ([place]) 47/42/5956   Chronic systolic CHF (congestive heart failure) ([place]) 05/30/2016   HTN (hypertension) 05/30/2016   Deaf 05/30/2016   Hyperkalemia 05/30/2016   Major depressive disorder, recurrent episode ([place]) 03/21/2016   Bronchitis 01/21/2015   Dyslipidemia 01/21/2015   Vitamin D deficiency 12/19/2011   Personal history of other infectious and parasitic diseases 10/25/2011   Anxiety disorder 05/12/2011   Hyperlipidemia 05/12/2011   Hypertriglyceridemia 05/12/2011   Obstructive sleep apnea 05/12/2011    Current Outpatient Medications:    acetaminophen (TYLENOL) 325 MG tablet, Take 2 tablets (650 mg total) by mouth every 4 (four) hours as needed for mild pain (or temp > 37.5 C (99.5 F))., Disp: , Rfl:    amiodarone (PACERONE) 200 MG tablet, Take 1 tablet (200 mg total) by mouth daily., Disp: 60 tablet, Rfl: 6   apixaban (ELIQUIS) 5 MG TABS tablet, TAKE 1 TABLET(5 MG) BY MOUTH TWICE DAILY, Disp: 60 tablet, Rfl: 11   atorvastatin (LIPITOR) 80 MG tablet, Take 1 tablet (80 mg total) by mouth at bedtime., Disp: 30 tablet, Rfl: 11   carvedilol (COREG) 25 MG tablet, TAKE 1 TABLET (25 MG TOTAL) BY MOUTH 2 (  TWO) TIMES DAILY., Disp: 60 tablet, Rfl: 0   dapagliflozin propanediol (FARXIGA) 10 MG TABS tablet, Take 1 tablet (10 mg total) by mouth daily before breakfast., Disp: 90 tablet, Rfl: 3   Evolocumab (REPATHA SURECLICK) 732 MG/ML SOAJ, Inject 140 mg into the skin every 14 (fourteen) days., Disp: 2 mL, Rfl: 11   fenofibrate 54 MG tablet, Take 54 mg by mouth daily., Disp: , Rfl:    furosemide (LASIX) 80 MG tablet, Take 1 tablet (80 mg total) by mouth 2 (two)  times daily., Disp: 60 tablet, Rfl: 6   hydrALAZINE (APRESOLINE) 100 MG tablet, Take 100 mg by mouth 3 (three) times daily., Disp: , Rfl:    insulin glargine (LANTUS SOLOSTAR) 100 UNIT/ML Solostar Pen, Inject 35 Units into the skin at bedtime., Disp: 31.5 mL, Rfl: 3   Insulin Pen Needle (PEN NEEDLES) 31G X 6 MM MISC, 1 pen by Does not apply route daily., Disp: 100 each, Rfl: 11   isosorbide mononitrate (IMDUR) 60 MG 24 hr tablet, Take 1 tablet (60 mg total) by mouth daily., Disp: 30 tablet, Rfl: 11   latanoprost (XALATAN) 0.005 % ophthalmic solution, Place 1 drop into both eyes at bedtime., Disp: , Rfl:    meclizine (ANTIVERT) 25 MG tablet, Take 1 tablet (25 mg total) by mouth 3 (three) times daily as needed for dizziness. (Patient taking differently: Take 25 mg by mouth 3 (three) times daily.), Disp: 90 tablet, Rfl: 3   pantoprazole (PROTONIX) 40 MG tablet, Take 1 tablet (40 mg total) by mouth 2 (two) times daily., Disp: 180 tablet, Rfl: 3   sertraline (ZOLOFT) 50 MG tablet, Take 1 tablet (50 mg total) by mouth daily., Disp: 90 tablet, Rfl: 1   VASCEPA 1 g capsule, Take 2 capsules (2 g total) by mouth 2 (two) times daily., Disp: 120 capsule, Rfl: 5   metolazone (ZAROXOLYN) 2.5 MG tablet, Take 1 tablet (2.5 mg total) by mouth as directed. Per Heart Failure Team instructions. (Patient not taking: Reported on 04/08/2021), Disp: 6 tablet, Rfl: 3   metoprolol tartrate (LOPRESSOR) 100 MG tablet, Take 1 tablet (100 mg total) two hours prior to CT testing. (Patient not taking: Reported on 04/08/2021), Disp: 1 tablet, Rfl: 0 Allergies  Allergen Reactions   Lisinopril Cough      Social History   Socioeconomic History   Marital status: Legally Separated    Spouse name: Not on file   Number of children: Not on file   Years of education: Not on file   Highest education level: Not on file  Occupational History   Not on file  Tobacco Use   Smoking status: Never   Smokeless tobacco: Never  Vaping  Use   Vaping Use: Never used  Substance and Sexual Activity   Alcohol use: Not Currently    Alcohol/week: 1.0 - 2.0 standard drink    Types: 1 - 2 Glasses of wine per week    Comment: rare   Drug use: No   Sexual activity: Yes    Birth control/protection: None  Other Topics Concern   Not on file  Social History Narrative   Not on file   Social Determinants of Health   Financial Resource Strain: Not on file  Food Insecurity: Not on file  Transportation Needs: Not on file  Physical Activity: Not on file  Stress: Not on file  Social Connections: Not on file  Intimate Partner Violence: Not on file    Physical Exam  Future Appointments  Date Time Provider [place]  04/09/2021 11:40 AM Larey Dresser, MD [facility] None  04/19/2021 11:00 AM Vevelyn Francois, NP [facility] None  05/27/2021  8:45 AM Frann Rider, NP [facility] None  06/08/2021  8:30 AM Constance Haw, MD [facility]  06/21/2021  8:35 AM [facility] DEVICE REMOTES [facility]  09/20/2021  8:35 AM [facility] DEVICE REMOTES [facility]  12/20/2021  8:35 AM [facility] DEVICE REMOTES [facility]       [name], EMT-Paramedic [place] Paramedic  04/08/21

## 2021-04-09 ENCOUNTER — Encounter (HOSPITAL_COMMUNITY): Payer: Medicare Other | Admitting: Cardiology

## 2021-04-14 ENCOUNTER — Telehealth (HOSPITAL_COMMUNITY): Payer: Self-pay

## 2021-04-14 NOTE — Telephone Encounter (Signed)
[place] to request the following for refills: I will p/u and do his med rec tomor.   Amio Farxiga Hydralazine Meclizine  Vascepa  Lasix   [name], EMT-Paramedic 04/14/21

## 2021-04-15 ENCOUNTER — Other Ambulatory Visit (HOSPITAL_COMMUNITY): Payer: Self-pay

## 2021-04-15 ENCOUNTER — Other Ambulatory Visit (HOSPITAL_COMMUNITY): Payer: Self-pay | Admitting: *Deleted

## 2021-04-15 MED ORDER — HYDRALAZINE HCL 100 MG PO TABS: 100.0000 mg | ORAL_TABLET | Freq: Three times a day (TID) | ORAL | 3 refills | Status: DC

## 2021-04-15 NOTE — Progress Notes (Signed)
Paramedicine Encounter    Patient ID: Troy Rush, male    DOB: 07/31/1970, 51 y.o.   MRN: [id number]   Arrived for visit with Troy Rush who has reported having some low BP readings today with slight headache and dizziness. He admits to not drinking a lot of water and stated he is not peeing a lot. Lowest BP reading was 94 systolic earlier today.   BP sitting 110/60 BP standing 100/48   He has been taking his medications but appears to have missed Thurs and Fri of last weeks doses. He states he simply missed them. I reviewed pill box Troy Rush EMT-P filled for him and added in appropriate meds that were missing. Troy Rush and Lasix everyday in the morning)  I advised Troy Rush to hydrate, take prescribed medications and to assure he is eating heart healthy meals three times daily. He agreed with plan and plans to see Troy Rush next week. Visit complete.   Refills: Hydralazine (next week)   Patient Care Team: Troy Francois, NP as PCP - General (Adult Health Nurse Practitioner) Troy Dresser, MD as PCP - Cardiology (Cardiology) Troy Soudan, NP as Nurse Practitioner (Cardiology) Troy Dresser, MD as Consulting Physician (Cardiology) Troy Ny, LCSW as Social Worker (Licensed Clinical Social Worker)  Patient Active Problem List   Diagnosis Date Noted   Subcortical infarction [facility]) 10/03/2020   Cerebral thrombosis with cerebral infarction 10/01/2020   Unilateral weakness 09/30/2020   History of atrial fibrillation 09/30/2020   History of DVT (deep vein thrombosis) 09/30/2020   CKD (chronic kidney disease) stage 3, GFR 30-59 ml/min ([place]) 07/08/2020   Acute CHF (congestive heart failure) ([place]) 07/07/2020   CKD (chronic kidney disease), stage IV ([place]) 07/07/2020   ICD (implantable cardioverter-defibrillator) in place 05/14/2020   Pulmonary embolism ([place]) 02/05/2020   Acute kidney injury superimposed on CKD ([place])    Chronic combined systolic and diastolic heart failure ([place])     Atrial fibrillation with RVR ([place]) 02/01/2020   NSVT (nonsustained ventricular tachycardia) 02/01/2020   Acute GI bleeding 02/01/2020   DKA (diabetic ketoacidosis) ([place]) 02/01/2020   Bilateral deafness 08/15/2019   Type 2 diabetes mellitus with complication, with long-term current use of insulin ([place]) 08/15/2019   Hemoglobin A1C between 7% and 9% indicating borderline diabetic control ([place]) 08/15/2019   Insomnia 08/15/2019   Abdominal pain 11/14/2018   Ischemic cardiomyopathy 10/14/2016   NICM (nonischemic cardiomyopathy) ([place]) 00/76/2263   Chronic systolic CHF (congestive heart failure) ([place]) 05/30/2016   HTN (hypertension) 05/30/2016   Deaf 05/30/2016   Hyperkalemia 05/30/2016   Major depressive disorder, recurrent episode ([place]) 03/21/2016   Bronchitis 01/21/2015   Dyslipidemia 01/21/2015   Vitamin D deficiency 12/19/2011   Personal history of other infectious and parasitic diseases 10/25/2011   Anxiety disorder 05/12/2011   Hyperlipidemia 05/12/2011   Hypertriglyceridemia 05/12/2011   Obstructive sleep apnea 05/12/2011    Current Outpatient Medications:    acetaminophen (TYLENOL) 325 MG tablet, Take 2 tablets (650 mg total) by mouth every 4 (four) hours as needed for mild pain (or temp > 37.5 C (99.5 F))., Disp: , Rfl:    amiodarone (PACERONE) 200 MG tablet, Take 1 tablet (200 mg total) by mouth daily., Disp: 60 tablet, Rfl: 6   apixaban (ELIQUIS) 5 MG TABS tablet, TAKE 1 TABLET(5 MG) BY MOUTH TWICE DAILY, Disp: 60 tablet, Rfl: 11   atorvastatin (LIPITOR) 80 MG tablet, Take 1 tablet (80 mg total) by mouth at bedtime., Disp: 30 tablet, Rfl: 11  carvedilol (COREG) 25 MG tablet, TAKE 1 TABLET (25 MG TOTAL) BY MOUTH 2 (TWO) TIMES DAILY., Disp: 60 tablet, Rfl: 0   dapagliflozin propanediol (FARXIGA) 10 MG TABS tablet, Take 1 tablet (10 mg total) by mouth daily before breakfast., Disp: 90 tablet, Rfl: 3   Evolocumab (REPATHA SURECLICK) 623 MG/ML SOAJ, Inject 140 mg into the skin every  14 (fourteen) days., Disp: 2 mL, Rfl: 11   fenofibrate 54 MG tablet, Take 54 mg by mouth daily., Disp: , Rfl:    furosemide (LASIX) 80 MG tablet, Take 1 tablet (80 mg total) by mouth 2 (two) times daily., Disp: 60 tablet, Rfl: 6   hydrALAZINE (APRESOLINE) 100 MG tablet, Take 1 tablet (100 mg total) by mouth 3 (three) times daily., Disp: 270 tablet, Rfl: 3   insulin glargine (LANTUS SOLOSTAR) 100 UNIT/ML Solostar Pen, Inject 35 Units into the skin at bedtime., Disp: 31.5 mL, Rfl: 3   Insulin Pen Needle (PEN NEEDLES) 31G X 6 MM MISC, 1 pen by Does not apply route daily., Disp: 100 each, Rfl: 11   isosorbide mononitrate (IMDUR) 60 MG 24 hr tablet, Take 1 tablet (60 mg total) by mouth daily., Disp: 30 tablet, Rfl: 11   latanoprost (XALATAN) 0.005 % ophthalmic solution, Place 1 drop into both eyes at bedtime., Disp: , Rfl:    meclizine (ANTIVERT) 25 MG tablet, Take 1 tablet (25 mg total) by mouth 3 (three) times daily as needed for dizziness. (Patient taking differently: Take 25 mg by mouth 3 (three) times daily.), Disp: 90 tablet, Rfl: 3   metolazone (ZAROXOLYN) 2.5 MG tablet, Take 1 tablet (2.5 mg total) by mouth as directed. Per Heart Failure Team instructions. (Patient not taking: Reported on 04/08/2021), Disp: 6 tablet, Rfl: 3   metoprolol tartrate (LOPRESSOR) 100 MG tablet, Take 1 tablet (100 mg total) two hours prior to CT testing. (Patient not taking: Reported on 04/08/2021), Disp: 1 tablet, Rfl: 0   pantoprazole (PROTONIX) 40 MG tablet, Take 1 tablet (40 mg total) by mouth 2 (two) times daily., Disp: 180 tablet, Rfl: 3   sertraline (ZOLOFT) 50 MG tablet, Take 1 tablet (50 mg total) by mouth daily., Disp: 90 tablet, Rfl: 1   VASCEPA 1 g capsule, Take 2 capsules (2 g total) by mouth 2 (two) times daily., Disp: 120 capsule, Rfl: 5 Allergies  Allergen Reactions   Lisinopril Cough     Social History   Socioeconomic History   Marital status: Legally Separated    Spouse name: Not on file    Number of children: Not on file   Years of education: Not on file   Highest education level: Not on file  Occupational History   Not on file  Tobacco Use   Smoking status: Never   Smokeless tobacco: Never  Vaping Use   Vaping Use: Never used  Substance and Sexual Activity   Alcohol use: Not Currently    Alcohol/week: 1.0 - 2.0 standard drink    Types: 1 - 2 Glasses of wine per week    Comment: rare   Drug use: No   Sexual activity: Yes    Birth control/protection: None  Other Topics Concern   Not on file  Social History Narrative   Not on file   Social Determinants of Health   Financial Resource Strain: Not on file  Food Insecurity: Not on file  Transportation Needs: Not on file  Physical Activity: Not on file  Stress: Not on file  Social Connections: Not on file  Intimate Partner Violence: Not on file    Physical Exam Vitals reviewed.  Constitutional:      Appearance: Normal appearance. He is normal weight.  HENT:     Head: Normocephalic.     Comments: Headache with slight dizziness     Nose: Nose normal.     Mouth/Throat:     Mouth: Mucous membranes are moist.     Pharynx: Oropharynx is clear.  Eyes:     Conjunctiva/sclera: Conjunctivae normal.     Pupils: Pupils are equal, round, and reactive to light.  Cardiovascular:     Rate and Rhythm: Normal rate and regular rhythm.     Pulses: Normal pulses.     Heart sounds: Normal heart sounds.  Pulmonary:     Effort: Pulmonary effort is normal.     Breath sounds: Normal breath sounds.  Abdominal:     General: Abdomen is flat.     Palpations: Abdomen is soft.  Musculoskeletal:        General: No swelling. Normal range of motion.     Cervical back: Normal range of motion.     Right lower leg: No edema.     Left lower leg: No edema.  Skin:    General: Skin is warm and dry.     Capillary Refill: Capillary refill takes less than 2 seconds.  Neurological:     General: No focal deficit present.     Mental  Status: He is alert. Mental status is at baseline.  Psychiatric:        Mood and Affect: Mood normal.        Future Appointments  Date Time Provider [place]  04/19/2021 11:00 AM Troy Francois, NP [facility] None  05/25/2021 10:40 AM Troy Dresser, MD [facility] None  05/27/2021  8:45 AM Frann Rider, NP [facility] None  06/08/2021  8:30 AM Constance Haw, MD [facility]  06/21/2021  8:35 AM [facility] DEVICE REMOTES [facility]  09/20/2021  8:35 AM [facility] DEVICE REMOTES [facility]  12/20/2021  8:35 AM [facility] DEVICE REMOTES [facility]     ACTION: Home visit completed

## 2021-04-19 ENCOUNTER — Ambulatory Visit (INDEPENDENT_AMBULATORY_CARE_PROVIDER_SITE_OTHER): Payer: Medicare Other | Admitting: Nurse Practitioner

## 2021-04-19 ENCOUNTER — Other Ambulatory Visit: Payer: Self-pay

## 2021-04-19 ENCOUNTER — Encounter: Payer: Self-pay | Admitting: Nurse Practitioner

## 2021-04-19 VITALS — BP 144/84 | HR 64 | Temp 98.0°F | Ht 70.0 in | Wt 245.8 lb

## 2021-04-19 DIAGNOSIS — Z794 Long term (current) use of insulin: Secondary | ICD-10-CM | POA: Diagnosis not present

## 2021-04-19 DIAGNOSIS — E118 Type 2 diabetes mellitus with unspecified complications: Secondary | ICD-10-CM

## 2021-04-19 DIAGNOSIS — I10 Essential (primary) hypertension: Secondary | ICD-10-CM | POA: Diagnosis not present

## 2021-04-19 LAB — POCT URINALYSIS DIP (CLINITEK)
Bilirubin, UA: NEGATIVE
Blood, UA: NEGATIVE
Glucose, UA: 500 mg/dL — AB
Ketones, POC UA: NEGATIVE mg/dL
Leukocytes, UA: NEGATIVE
Nitrite, UA: NEGATIVE
POC PROTEIN,UA: 30 — AB
Spec Grav, UA: 1.02 (ref 1.010–1.025)
Urobilinogen, UA: 0.2 E.U./dL
pH, UA: 7 (ref 5.0–8.0)

## 2021-04-19 LAB — POCT GLYCOSYLATED HEMOGLOBIN (HGB A1C)
HbA1c POC (<> result, manual entry): 6 % (ref 4.0–5.6)
HbA1c, POC (controlled diabetic range): 6 % (ref 0.0–7.0)
HbA1c, POC (prediabetic range): 6 % (ref 5.7–6.4)
Hemoglobin A1C: 6 % — AB (ref 4.0–5.6)

## 2021-04-19 MED ORDER — FREESTYLE LIBRE 3 SENSOR MISC: 1.0000 "application " | 5 refills | Status: DC

## 2021-04-19 MED ORDER — FREESTYLE LIBRE 2 READER DEVI: 1.0000 "application " | 0 refills | Status: DC

## 2021-04-19 NOTE — Progress Notes (Signed)
[place], [place]  [address] Phone:  [phone]   Fax:  [phone]   Established Patient Office Visit  Subjective:  Patient ID: Troy Rush, male    DOB: 08/30/1970  Age: 51 y.o. MRN: [id number]  CC:  Chief Complaint  Patient presents with   Follow-up    Pt is here today for his 3 month follow up visit.  Pt states that his blood has been running low and was seen in the hospital for this issue back in December 2022. Pt also stated that when the home health nurse came to his home she mentioned him getting the Freestyle monitor to check his blood sugar levels. Pt is tired of pricking his fingers.    HPI Troy Rush presents for follow up. He  has a past medical history of AICD (automatic cardioverter/defibrillator) present (10/14/2016), Bilateral deafness, Bronchitis, CHF (congestive heart failure) ([place]), Depression, Diabetes mellitus without complication ([place]), DVT, lower extremity ([place]) (07/12/2019), Dyslipidemia, ESRD (end stage renal disease) ([place]), Headache (08/2019), Hypertension, ICD (implantable cardioverter-defibrillator) in place, Insomnia (08/2019), NICM (nonischemic cardiomyopathy) ([place]) (09/15/2016), Pancreatitis, Renal disorder, Sleep apnea, and Vitamin D deficiency (08/2019).   He has episode of dizziness with his BP 90/50. He has noticed this not too often. He has seen this in the morning. He is taking the hydralazine 100 mg TID. He missed his apt with caridiology. He reports that he went to work and was dizzy.  He had to leave work. He states that EMT did evaluate him. He has experienced some orthostatic hypotension. Standing has caused his BP to drop. He was out last Wednesday. He reports that his kidney doctor also stated that there may need to be some adjustments in his medication .Denies headache, shortness of breath, dyspnea on exertion, chest pain, nausea, vomiting or any edema.   He is also following up for diabetes.  His current A1c is 7.8% on dapgliflozin 10 mg , and lantus 35 units qhs. He reports that his CBG is within acceptable range. He is interested in continuous glucose monitoring. He endorses increased pain and frustration with daily singlesticks. This will also help to easily identify if the dizziness can be related to hypoglycemia   Past Medical History:  Diagnosis Date   AICD (automatic cardioverter/defibrillator) present 10/14/2016   Bilateral deafness    Bronchitis    CHF (congestive heart failure) ([place])    Depression    Diabetes mellitus without complication ([place])    DVT, lower extremity ([place]) 07/12/2019   Dyslipidemia    ESRD (end stage renal disease) ([place])    Reports stage IV CKD, Has not started dialysis d/t inability to communicate w/ nephrologist on his first appointment   Headache 08/2019   Hypertension    ICD (implantable cardioverter-defibrillator) in place    Insomnia 08/2019   NICM (nonischemic cardiomyopathy) ([place]) 09/15/2016   Pancreatitis    Renal disorder    Sleep apnea    Vitamin D deficiency 08/2019    Past Surgical History:  Procedure Laterality Date   ATRIAL FIBRILLATION ABLATION N/A 03/02/2021   Procedure: ATRIAL FIBRILLATION ABLATION;  Surgeon: Constance Haw, MD;  Location: [facility];  Service: Cardiovascular;  Laterality: N/A;   BACK SURGERY     CARDIAC CATHETERIZATION     ESOPHAGOGASTRODUODENOSCOPY (EGD) WITH PROPOFOL N/A 02/02/2020   Procedure: ESOPHAGOGASTRODUODENOSCOPY (EGD) WITH PROPOFOL;  Surgeon: Arta Silence, MD;  Location: [facility];  Service: Endoscopy;  Laterality: N/A;  ICD IMPLANT  10/14/2016   ICD IMPLANT N/A 10/14/2016   Procedure: ICD Implant;  Surgeon: Deboraha Sprang, MD;  Location: [facility];  Service: Cardiovascular;  Laterality: N/A;   PRESSURE SENSOR/CARDIOMEMS N/A 09/10/2020   Procedure: PRESSURE SENSOR/CARDIOMEMS;  Surgeon: Larey Dresser, MD;  Location: [facility];  Service: Cardiovascular;   Laterality: N/A;   RIGHT HEART CATH N/A 05/21/2020   Procedure: RIGHT HEART CATH;  Surgeon: Larey Dresser, MD;  Location: [facility];  Service: Cardiovascular;  Laterality: N/A;   WRIST SURGERY      Family History  Problem Relation Age of Onset   Hypertension Mother    Healthy Father    Stroke Father     Social History   Socioeconomic History   Marital status: Legally Separated    Spouse name: Not on file   Number of children: Not on file   Years of education: Not on file   Highest education level: Not on file  Occupational History   Not on file  Tobacco Use   Smoking status: Never   Smokeless tobacco: Never  Vaping Use   Vaping Use: Never used  Substance and Sexual Activity   Alcohol use: Not Currently    Alcohol/week: 1.0 - 2.0 standard drink    Types: 1 - 2 Glasses of wine per week    Comment: rare   Drug use: No   Sexual activity: Yes    Birth control/protection: None  Other Topics Concern   Not on file  Social History Narrative   Not on file   Social Determinants of Health   Financial Resource Strain: Not on file  Food Insecurity: Not on file  Transportation Needs: Not on file  Physical Activity: Not on file  Stress: Not on file  Social Connections: Not on file  Intimate Partner Violence: Not on file    Outpatient Medications Prior to Visit  Medication Sig Dispense Refill   acetaminophen (TYLENOL) 325 MG tablet Take 2 tablets (650 mg total) by mouth every 4 (four) hours as needed for mild pain (or temp > 37.5 C (99.5 F)).     amiodarone (PACERONE) 200 MG tablet Take 1 tablet (200 mg total) by mouth daily. 60 tablet 6   apixaban (ELIQUIS) 5 MG TABS tablet TAKE 1 TABLET(5 MG) BY MOUTH TWICE DAILY 60 tablet 11   atorvastatin (LIPITOR) 80 MG tablet Take 1 tablet (80 mg total) by mouth at bedtime. 30 tablet 11   carvedilol (COREG) 25 MG tablet TAKE 1 TABLET (25 MG TOTAL) BY MOUTH 2 (TWO) TIMES DAILY. 60 tablet 0   dapagliflozin propanediol  (FARXIGA) 10 MG TABS tablet Take 1 tablet (10 mg total) by mouth daily before breakfast. 90 tablet 3   Evolocumab (REPATHA SURECLICK) 443 MG/ML SOAJ Inject 140 mg into the skin every 14 (fourteen) days. 2 mL 11   fenofibrate 54 MG tablet Take 54 mg by mouth daily.     hydrALAZINE (APRESOLINE) 100 MG tablet Take 1 tablet (100 mg total) by mouth 3 (three) times daily. 270 tablet 3   insulin glargine (LANTUS SOLOSTAR) 100 UNIT/ML Solostar Pen Inject 35 Units into the skin at bedtime. 31.5 mL 3   Insulin Pen Needle (PEN NEEDLES) 31G X 6 MM MISC 1 pen by Does not apply route daily. 100 each 11   isosorbide mononitrate (IMDUR) 60 MG 24 hr tablet Take 1 tablet (60 mg total) by mouth daily. 30 tablet 11   latanoprost (  XALATAN) 0.005 % ophthalmic solution Place 1 drop into both eyes at bedtime.     meclizine (ANTIVERT) 25 MG tablet Take 1 tablet (25 mg total) by mouth 3 (three) times daily as needed for dizziness. (Patient taking differently: Take 25 mg by mouth 3 (three) times daily.) 90 tablet 3   metolazone (ZAROXOLYN) 2.5 MG tablet Take 1 tablet (2.5 mg total) by mouth as directed. Per Heart Failure Team instructions. 6 tablet 3   metoprolol tartrate (LOPRESSOR) 100 MG tablet Take 1 tablet (100 mg total) two hours prior to CT testing. 1 tablet 0   pantoprazole (PROTONIX) 40 MG tablet Take 1 tablet (40 mg total) by mouth 2 (two) times daily. 180 tablet 3   sertraline (ZOLOFT) 50 MG tablet Take 1 tablet (50 mg total) by mouth daily. 90 tablet 1   VASCEPA 1 g capsule Take 2 capsules (2 g total) by mouth 2 (two) times daily. 120 capsule 5   furosemide (LASIX) 80 MG tablet Take 1 tablet (80 mg total) by mouth 2 (two) times daily. 60 tablet 6   No facility-administered medications prior to visit.    Allergies  Allergen Reactions   Lisinopril Cough    ROS Review of Systems    Objective:    Physical Exam Exam conducted with a chaperone present.  Constitutional:      Appearance: He is obese.   HENT:     Head: Normocephalic and atraumatic.     Nose: Nose normal.     Mouth/Throat:     Mouth: Mucous membranes are moist.  Cardiovascular:     Rate and Rhythm: Normal rate and regular rhythm.     Pulses: Normal pulses.     Heart sounds: Normal heart sounds.  Pulmonary:     Effort: Pulmonary effort is normal.     Breath sounds: Normal breath sounds.  Abdominal:     General: Bowel sounds are normal.     Palpations: Abdomen is soft.  Musculoskeletal:        General: Normal range of motion.     Cervical back: Normal range of motion.     Right lower leg: No edema.     Left lower leg: No edema.  Skin:    General: Skin is warm and dry.     Capillary Refill: Capillary refill takes less than 2 seconds.  Neurological:     General: No focal deficit present.     Mental Status: He is alert and oriented to person, place, and time.    BP (!) 144/84    Pulse 64    Temp 98 F (36.7 C)    Ht '5\' 10"'  (1.778 m)    Wt 245 lb 12.8 oz (111.5 kg)    SpO2 98%    BMI 35.27 kg/m  Wt Readings from Last 3 Encounters:  04/19/21 245 lb 12.8 oz (111.5 kg)  04/08/21 240 lb (108.9 kg)  04/06/21 241 lb (109.3 kg)     Health Maintenance Due  Topic Date Due   Zoster Vaccines- Shingrix (1 of 2) Never done   OPHTHALMOLOGY EXAM  05/30/2017   COVID-19 Vaccine (4 - Booster for Pfizer series) 05/29/2020    There are no preventive care reminders to display for this patient.  Lab Results  Component Value Date   TSH 1.957 09/10/2020   Lab Results  Component Value Date   WBC 5.0 04/06/2021   HGB 11.9 (L) 04/06/2021   HCT 38.8 (L) 04/06/2021   MCV 91.9 04/06/2021  PLT 213 04/06/2021   Lab Results  Component Value Date   NA 133 (L) 04/06/2021   K 4.1 04/06/2021   CO2 26 04/06/2021   GLUCOSE 121 (H) 04/06/2021   BUN 39 (H) 04/06/2021   CREATININE 3.71 (H) 04/06/2021   BILITOT 1.0 04/06/2021   ALKPHOS 43 04/06/2021   AST 32 04/06/2021   ALT 17 04/06/2021   PROT 7.6 04/06/2021   ALBUMIN 3.8  04/06/2021   CALCIUM 9.0 04/06/2021   ANIONGAP 10 04/06/2021   EGFR 33 (L) 02/04/2021   Lab Results  Component Value Date   CHOL 120 02/25/2021   Lab Results  Component Value Date   HDL 48 02/25/2021   Lab Results  Component Value Date   LDLCALC 33 02/25/2021   Lab Results  Component Value Date   TRIG 260 (H) 02/25/2021   Lab Results  Component Value Date   CHOLHDL 2.5 02/25/2021   Lab Results  Component Value Date   HGBA1C 6.0 (A) 04/19/2021   HGBA1C 6.0 04/19/2021   HGBA1C 6.0 04/19/2021   HGBA1C 6.0 04/19/2021      Assessment & Plan:   Problem List Items Addressed This Visit       Endocrine   Type 2 diabetes mellitus with complication, with long-term current use of insulin (HCC) - Primary Controlled and Stable current A1c 6.0% Encourage compliance with current treatment regimen   Continuous glucose monitoring prescribed Encourage contacting office if excessive hyperglycemia and or hypoglycemia Lifestyle modification with healthy diet (fewer calories, more high fiber foods, whole grains and non-starchy vegetables, lower fat meat and fish, low-fat diary include healthy oils) regular exercise (physical activity) and weight loss Opthalmology exam discussed  Regular dental visits encouraged Home BP monitoring also encouraged goal <130/80    Relevant Orders   HgB A1c (Completed)   Comp. Metabolic Panel (12) (Completed)   POCT URINALYSIS DIP (CLINITEK) (Completed)   Other Visit Diagnoses     Essential hypertension   Persistent however recent episodes of hypotension Encourage follow up with cardiology Instructed to start hydralazine 50 mg TID until follow up for evaluation of current regimen.    Relevant Orders   Comp. Metabolic Panel (12) (Completed)   POCT URINALYSIS DIP (CLINITEK) (Completed)       Meds ordered this encounter  Medications   Continuous Blood Gluc Receiver (FREESTYLE LIBRE 2 READER) DEVI    Sig: 1 application by Does not apply route  every 14 (fourteen) days.    Dispense:  1 each    Refill:  0    Order Specific Question:   Supervising Provider    Answer:   Tresa Garter [[id number]]   Continuous Blood Gluc Sensor (FREESTYLE LIBRE 3 SENSOR) MISC    Sig: 1 application by Does not apply route every 14 (fourteen) days. Place 1 sensor on the skin every 14 days. Use to check glucose continuously    Dispense:  2 each    Refill:  5    Order Specific Question:   Supervising Provider    Answer:   Tresa Garter [[id number]]    Follow-up: No follow-ups on file.    Vevelyn Francois, NP

## 2021-04-19 NOTE — Patient Instructions (Signed)
Diabetes Mellitus and Nutrition, Adult When you have diabetes, or diabetes mellitus, it is very important to have healthy eating habits because your blood sugar (glucose) levels are greatly affected by what you eat and drink. Eating healthy foods in the right amounts, at about the same times every day, can help you: Manage your blood glucose. Lower your risk of heart disease. Improve your blood pressure. Reach or maintain a healthy weight. What can affect my meal plan? Every person with diabetes is different, and each person has different needs for a meal plan. Your health care provider may recommend that you work with a dietitian to make a meal plan that is best for you. Your meal plan may vary depending on factors such as: The calories you need. The medicines you take. Your weight. Your blood glucose, blood pressure, and cholesterol levels. Your activity level. Other health conditions you have, such as heart or kidney disease. How do carbohydrates affect me? Carbohydrates, also called carbs, affect your blood glucose level more than any other type of food. Eating carbs raises the amount of glucose in your blood. It is important to know how many carbs you can safely have in each meal. This is different for every person. Your dietitian can help you calculate how many carbs you should have at each meal and for each snack. How does alcohol affect me? Alcohol can cause a decrease in blood glucose (hypoglycemia), especially if you use insulin or take certain diabetes medicines by mouth. Hypoglycemia can be a life-threatening condition. Symptoms of hypoglycemia, such as sleepiness, dizziness, and confusion, are similar to symptoms of having too much alcohol. Do not drink alcohol if: Your health care provider tells you not to drink. You are pregnant, may be pregnant, or are planning to become pregnant. If you drink alcohol: Limit how much you have to: 0-1 drink a day for women. 0-2 drinks a day  for men. Know how much alcohol is in your drink. In the [place] one drink equals one 12 oz bottle of beer (355 mL), one 5 oz glass of wine (148 mL), or one 1 oz glass of hard liquor (44 mL). Keep yourself hydrated with water, diet soda, or unsweetened iced tea. Keep in mind that regular soda, juice, and other mixers may contain a lot of sugar and must be counted as carbs. What are tips for following this plan? Reading food labels Start by checking the serving size on the Nutrition Facts label of packaged foods and drinks. The number of calories and the amount of carbs, fats, and other nutrients listed on the label are based on one serving of the item. Many items contain more than one serving per package. Check the total grams (g) of carbs in one serving. Check the number of grams of saturated fats and trans fats in one serving. Choose foods that have a low amount or none of these fats. Check the number of milligrams (mg) of salt (sodium) in one serving. Most people should limit total sodium intake to less than 2,300 mg per day. Always check the nutrition information of foods labeled as "low-fat" or "nonfat." These foods may be higher in added sugar or refined carbs and should be avoided. Talk to your dietitian to identify your daily goals for nutrients listed on the label. Shopping Avoid buying canned, pre-made, or processed foods. These foods tend to be high in fat, sodium, and added sugar. Shop around the outside edge of the grocery store. This is where you will  most often find fresh fruits and vegetables, bulk grains, fresh meats, and fresh dairy products. Cooking Use low-heat cooking methods, such as baking, instead of high-heat cooking methods, such as deep frying. Cook using healthy oils, such as olive, canola, or sunflower oil. Avoid cooking with butter, cream, or high-fat meats. Meal planning Eat meals and snacks regularly, preferably at the same times every day. Avoid going long periods of  time without eating. Eat foods that are high in fiber, such as fresh fruits, vegetables, beans, and whole grains. Eat 4-6 oz (112-168 g) of lean protein each day, such as lean meat, chicken, fish, eggs, or tofu. One ounce (oz) (28 g) of lean protein is equal to: 1 oz (28 g) of meat, chicken, or fish. 1 egg.  cup (62 g) of tofu. Eat some foods each day that contain healthy fats, such as avocado, nuts, seeds, and fish. What foods should I eat? Fruits Berries. Apples. Oranges. Peaches. Apricots. Plums. Grapes. Mangoes. Papayas. Pomegranates. Kiwi. Cherries. Vegetables Leafy greens, including lettuce, spinach, kale, chard, collard greens, mustard greens, and cabbage. Beets. Cauliflower. Broccoli. Carrots. Green beans. Tomatoes. Peppers. Onions. Cucumbers. Brussels sprouts. Grains Whole grains, such as whole-wheat or whole-grain bread, crackers, tortillas, cereal, and pasta. Unsweetened oatmeal. Quinoa. Brown or wild rice. Meats and other proteins Seafood. Poultry without skin. Lean cuts of poultry and beef. Tofu. Nuts. Seeds. Dairy Low-fat or fat-free dairy products such as milk, yogurt, and cheese. The items listed above may not be a complete list of foods and beverages you can eat and drink. Contact a dietitian for more information. What foods should I avoid? Fruits Fruits canned with syrup. Vegetables Canned vegetables. Frozen vegetables with butter or cream sauce. Grains Refined white flour and flour products such as bread, pasta, snack foods, and cereals. Avoid all processed foods. Meats and other proteins Fatty cuts of meat. Poultry with skin. Breaded or fried meats. Processed meat. Avoid saturated fats. Dairy Full-fat yogurt, cheese, or milk. Beverages Sweetened drinks, such as soda or iced tea. The items listed above may not be a complete list of foods and beverages you should avoid. Contact a dietitian for more information. Questions to ask a health care provider Do I need to  meet with a certified diabetes care and education specialist? Do I need to meet with a dietitian? What number can I call if I have questions? When are the best times to check my blood glucose? Where to find more information: American Diabetes Association: diabetes.org Academy of Nutrition and Dietetics: eatright.[employer] of Diabetes and Digestive and Kidney Diseases: AmenCredit.is Association of Diabetes Care & Education Specialists: diabeteseducator.org Summary It is important to have healthy eating habits because your blood sugar (glucose) levels are greatly affected by what you eat and drink. It is important to use alcohol carefully. A healthy meal plan will help you manage your blood glucose and lower your risk of heart disease. Your health care provider may recommend that you work with a dietitian to make a meal plan that is best for you. This information is not intended to replace advice given to you by your health care provider. Make sure you discuss any questions you have with your health care provider. Document Revised: 10/30/2019 Document Reviewed: 10/30/2019 Elsevier Patient Education  2022 [place] Your Hypertension Hypertension, also called high blood pressure, is when the force of the blood pressing against the walls of the arteries is too strong. Arteries are blood vessels that carry blood from your heart throughout  your body. Hypertension forces the heart to work harder to pump blood and may cause the arteries to become narrow or stiff. Understanding blood pressure readings Your personal target blood pressure may vary depending on your medical conditions, your age, and other factors. A blood pressure reading includes a higher number over a lower number. Ideally, your blood pressure should be below 120/80. You should know that: The first, or top, number is called the systolic pressure. It is a measure of the pressure in your arteries as your heart  beats. The second, or bottom number, is called the diastolic pressure. It is a measure of the pressure in your arteries as the heart relaxes. Blood pressure is classified into four stages. Based on your blood pressure reading, your health care provider may use the following stages to determine what type of treatment you need, if any. Systolic pressure and diastolic pressure are measured in a unit called mmHg. Normal Systolic pressure: below 299. Diastolic pressure: below 80. Elevated Systolic pressure: 242-683. Diastolic pressure: below 80. Hypertension stage 1 Systolic pressure: 419-622. Diastolic pressure: 29-79. Hypertension stage 2 Systolic pressure: 892 or above. Diastolic pressure: 90 or above. How can this condition affect me? Managing your hypertension is an important responsibility. Over time, hypertension can damage the arteries and decrease blood flow to important parts of the body, including the brain, heart, and kidneys. Having untreated or uncontrolled hypertension can lead to: A heart attack. A stroke. A weakened blood vessel (aneurysm). Heart failure. Kidney damage. Eye damage. Metabolic syndrome. Memory and concentration problems. Vascular dementia. What actions can I take to manage this condition? Hypertension can be managed by making lifestyle changes and possibly by taking medicines. Your health care provider will help you make a plan to bring your blood pressure within a normal range. Nutrition  Eat a diet that is high in fiber and potassium, and low in salt (sodium), added sugar, and fat. An example eating plan is called the Dietary Approaches to Stop Hypertension (DASH) diet. To eat this way: Eat plenty of fresh fruits and vegetables. Try to fill one-half of your plate at each meal with fruits and vegetables. Eat whole grains, such as whole-wheat pasta, brown rice, or whole-grain bread. Fill about one-fourth of your plate with whole grains. Eat low-fat dairy  products. Avoid fatty cuts of meat, processed or cured meats, and poultry with skin. Fill about one-fourth of your plate with lean proteins such as fish, chicken without skin, beans, eggs, and tofu. Avoid pre-made and processed foods. These tend to be higher in sodium, added sugar, and fat. Reduce your daily sodium intake. Most people with hypertension should eat less than 1,500 mg of sodium a day. Lifestyle  Work with your health care provider to maintain a healthy body weight or to lose weight. Ask what an ideal weight is for you. Get at least 30 minutes of exercise that causes your heart to beat faster (aerobic exercise) most days of the week. Activities may include walking, swimming, or biking. Include exercise to strengthen your muscles (resistance exercise), such as weight lifting, as part of your weekly exercise routine. Try to do these types of exercises for 30 minutes at least 3 days a week. Do not use any products that contain nicotine or tobacco, such as cigarettes, e-cigarettes, and chewing tobacco. If you need help quitting, ask your health care provider. Control any long-term (chronic) conditions you have, such as high cholesterol or diabetes. Identify your sources of stress and find ways to manage  stress. This may include meditation, deep breathing, or making time for fun activities. Alcohol use Do not drink alcohol if: Your health care provider tells you not to drink. You are pregnant, may be pregnant, or are planning to become pregnant. If you drink alcohol: Limit how much you use to: 0-1 drink a day for women. 0-2 drinks a day for men. Be aware of how much alcohol is in your drink. In the [place] one drink equals one 12 oz bottle of beer (355 mL), one 5 oz glass of wine (148 mL), or one 1 oz glass of hard liquor (44 mL). Medicines Your health care provider may prescribe medicine if lifestyle changes are not enough to get your blood pressure under control and if: Your systolic  blood pressure is 130 or higher. Your diastolic blood pressure is 80 or higher. Take medicines only as told by your health care provider. Follow the directions carefully. Blood pressure medicines must be taken as told by your health care provider. The medicine does not work as well when you skip doses. Skipping doses also puts you at risk for problems. Monitoring Before you monitor your blood pressure: Do not smoke, drink caffeinated beverages, or exercise within 30 minutes before taking a measurement. Use the bathroom and empty your bladder (urinate). Sit quietly for at least 5 minutes before taking measurements. Monitor your blood pressure at home as told by your health care provider. To do this: Sit with your back straight and supported. Place your feet flat on the floor. Do not cross your legs. Support your arm on a flat surface, such as a table. Make sure your upper arm is at heart level. Each time you measure, take two or three readings one minute apart and record the results. You may also need to have your blood pressure checked regularly by your health care provider. General information Talk with your health care provider about your diet, exercise habits, and other lifestyle factors that may be contributing to hypertension. Review all the medicines you take with your health care provider because there may be side effects or interactions. Keep all visits as told by your health care provider. Your health care provider can help you create and adjust your plan for managing your high blood pressure. Where to find more information National Heart, Lung, and Blood Institute: https://wilson-eaton.com/ American Heart Association: www.heart.org Contact a health care provider if: You think you are having a reaction to medicines you have taken. You have repeated (recurrent) headaches. You feel dizzy. You have swelling in your ankles. You have trouble with your vision. Get help right away if: You  develop a severe headache or confusion. You have unusual weakness or numbness, or you feel faint. You have severe pain in your chest or abdomen. You vomit repeatedly. You have trouble breathing. These symptoms may represent a serious problem that is an emergency. Do not wait to see if the symptoms will go away. Get medical help right away. Call your local emergency services (911 in the [place] Do not drive yourself to the hospital. Summary Hypertension is when the force of blood pumping through your arteries is too strong. If this condition is not controlled, it may put you at risk for serious complications. Your personal target blood pressure may vary depending on your medical conditions, your age, and other factors. For most people, a normal blood pressure is less than 120/80. Hypertension is managed by lifestyle changes, medicines, or both. Lifestyle changes to help manage hypertension include losing  weight, eating a healthy, low-sodium diet, exercising more, stopping smoking, and limiting alcohol. This information is not intended to replace advice given to you by your health care provider. Make sure you discuss any questions you have with your health care provider. Document Revised: 04/15/2019 Document Reviewed: 02/26/2019 Elsevier Patient Education  2022 [employer].

## 2021-04-20 LAB — COMP. METABOLIC PANEL (12)
AST: 24 IU/L (ref 0–40)
Albumin/Globulin Ratio: 1.3 (ref 1.2–2.2)
Albumin: 4.3 g/dL (ref 3.8–4.9)
Alkaline Phosphatase: 48 IU/L (ref 44–121)
BUN/Creatinine Ratio: 11 (ref 9–20)
BUN: 36 mg/dL — ABNORMAL HIGH (ref 6–24)
Bilirubin Total: 0.6 mg/dL (ref 0.0–1.2)
Calcium: 9.6 mg/dL (ref 8.7–10.2)
Chloride: 99 mmol/L (ref 96–106)
Creatinine, Ser: 3.19 mg/dL — ABNORMAL HIGH (ref 0.76–1.27)
Globulin, Total: 3.4 g/dL (ref 1.5–4.5)
Glucose: 99 mg/dL (ref 70–99)
Potassium: 4.5 mmol/L (ref 3.5–5.2)
Sodium: 138 mmol/L (ref 134–144)
Total Protein: 7.7 g/dL (ref 6.0–8.5)
eGFR: 23 mL/min/{1.73_m2} — ABNORMAL LOW (ref >59)

## 2021-04-21 ENCOUNTER — Encounter: Payer: Self-pay | Admitting: Nurse Practitioner

## 2021-04-21 ENCOUNTER — Telehealth (HOSPITAL_COMMUNITY): Payer: Self-pay

## 2021-04-21 NOTE — Telephone Encounter (Signed)
[facility] to request the following for refills:  Hydralazine      [name], EMT-Paramedic 04/21/21

## 2021-04-22 ENCOUNTER — Other Ambulatory Visit (HOSPITAL_COMMUNITY): Payer: Self-pay

## 2021-04-22 NOTE — Progress Notes (Addendum)
Paramedicine Encounter    Patient ID: Troy Rush, male    DOB: 12/24/1970, 51 y.o.   MRN: [id number]  Pt was seen by PCP recently and she wants to decreased his hydralazine to 50mg  TID due to him c/o dizziness and some intermittent low b/p. However it does not reflect in his epic med list.  She also has sent in freestyle-has not received it yet.  He will monitor his b/p at home and let me know should his b/p runs high. It was elevated this morning when he took it but that was prior to taking his meds.  He took meds right before I walked in door.  He is going to check his b/p in a couple hrs and is going to let me know. Meds verified and 2 wks of pill boxes refilled.     BP (!) 164/90    Pulse 68    Resp 18    Wt 243 lb (110.2 kg)    SpO2 98%    BMI 34.87 kg/m  Weight yesterday-? Last visit weight-245 @ PCP   Refills needed: Carvedilol    Patient Care Team: Vevelyn Francois, NP as PCP - General (Adult Health Nurse Practitioner) Larey Dresser, MD as PCP - Cardiology (Cardiology) Conrad Crystal, NP as Nurse Practitioner (Cardiology) Larey Dresser, MD as Consulting Physician (Cardiology) Jorge Ny, LCSW as Social Worker (Licensed Clinical Social Worker)  Patient Active Problem List   Diagnosis Date Noted   Subcortical infarction [facility]) 10/03/2020   Cerebral thrombosis with cerebral infarction 10/01/2020   Unilateral weakness 09/30/2020   History of atrial fibrillation 09/30/2020   History of DVT (deep vein thrombosis) 09/30/2020   CKD (chronic kidney disease) stage 3, GFR 30-59 ml/min ([place]) 07/08/2020   Acute CHF (congestive heart failure) ([place]) 07/07/2020   CKD (chronic kidney disease), stage IV ([place]) 07/07/2020   ICD (implantable cardioverter-defibrillator) in place 05/14/2020   Pulmonary embolism ([place]) 02/05/2020   Acute kidney injury superimposed on CKD ([place])    Chronic combined systolic and diastolic heart failure ([place])    Atrial fibrillation with RVR  ([place]) 02/01/2020   NSVT (nonsustained ventricular tachycardia) 02/01/2020   Acute GI bleeding 02/01/2020   DKA (diabetic ketoacidosis) ([place]) 02/01/2020   Bilateral deafness 08/15/2019   Type 2 diabetes mellitus with complication, with long-term current use of insulin ([place]) 08/15/2019   Hemoglobin A1C between 7% and 9% indicating borderline diabetic control ([place]) 08/15/2019   Insomnia 08/15/2019   Abdominal pain 11/14/2018   Ischemic cardiomyopathy 10/14/2016   NICM (nonischemic cardiomyopathy) ([place]) 03/70/4888   Chronic systolic CHF (congestive heart failure) ([place]) 05/30/2016   HTN (hypertension) 05/30/2016   Deaf 05/30/2016   Hyperkalemia 05/30/2016   Major depressive disorder, recurrent episode ([place]) 03/21/2016   Bronchitis 01/21/2015   Dyslipidemia 01/21/2015   Vitamin D deficiency 12/19/2011   Personal history of other infectious and parasitic diseases 10/25/2011   Anxiety disorder 05/12/2011   Hyperlipidemia 05/12/2011   Hypertriglyceridemia 05/12/2011   Obstructive sleep apnea 05/12/2011    Current Outpatient Medications:    acetaminophen (TYLENOL) 325 MG tablet, Take 2 tablets (650 mg total) by mouth every 4 (four) hours as needed for mild pain (or temp > 37.5 C (99.5 F))., Disp: , Rfl:    amiodarone (PACERONE) 200 MG tablet, Take 1 tablet (200 mg total) by mouth daily., Disp: 60 tablet, Rfl: 6   apixaban (ELIQUIS) 5 MG TABS tablet, TAKE 1 TABLET(5 MG) BY MOUTH TWICE DAILY, Disp: 60 tablet,  Rfl: 11   atorvastatin (LIPITOR) 80 MG tablet, Take 1 tablet (80 mg total) by mouth at bedtime., Disp: 30 tablet, Rfl: 11   carvedilol (COREG) 25 MG tablet, TAKE 1 TABLET (25 MG TOTAL) BY MOUTH 2 (TWO) TIMES DAILY., Disp: 60 tablet, Rfl: 0   Cholecalciferol (D3-1000 PO), Take 1,000 Units by mouth daily., Disp: , Rfl:    dapagliflozin propanediol (FARXIGA) 10 MG TABS tablet, Take 1 tablet (10 mg total) by mouth daily before breakfast., Disp: 90 tablet, Rfl: 3    Evolocumab (REPATHA SURECLICK) 465 MG/ML SOAJ, Inject 140 mg into the skin every 14 (fourteen) days., Disp: 2 mL, Rfl: 11   fenofibrate 54 MG tablet, Take 54 mg by mouth daily., Disp: , Rfl:    hydrALAZINE (APRESOLINE) 100 MG tablet, Take 1 tablet (100 mg total) by mouth 3 (three) times daily. (Patient taking differently: Take 100 mg by mouth 3 (three) times daily.), Disp: 270 tablet, Rfl: 3   insulin glargine (LANTUS SOLOSTAR) 100 UNIT/ML Solostar Pen, Inject 35 Units into the skin at bedtime., Disp: 31.5 mL, Rfl: 3   Insulin Pen Needle (PEN NEEDLES) 31G X 6 MM MISC, 1 pen by Does not apply route daily., Disp: 100 each, Rfl: 11   isosorbide mononitrate (IMDUR) 60 MG 24 hr tablet, Take 1 tablet (60 mg total) by mouth daily., Disp: 30 tablet, Rfl: 11   latanoprost (XALATAN) 0.005 % ophthalmic solution, Place 1 drop into both eyes at bedtime., Disp: , Rfl:    meclizine (ANTIVERT) 25 MG tablet, Take 1 tablet (25 mg total) by mouth 3 (three) times daily as needed for dizziness. (Patient taking differently: Take 25 mg by mouth 3 (three) times daily.), Disp: 90 tablet, Rfl: 3   pantoprazole (PROTONIX) 40 MG tablet, Take 1 tablet (40 mg total) by mouth 2 (two) times daily., Disp: 180 tablet, Rfl: 3   sertraline (ZOLOFT) 50 MG tablet, Take 1 tablet (50 mg total) by mouth daily., Disp: 90 tablet, Rfl: 1   VASCEPA 1 g capsule, Take 2 capsules (2 g total) by mouth 2 (two) times daily., Disp: 120 capsule, Rfl: 5   Continuous Blood Gluc Receiver (FREESTYLE LIBRE 2 READER) DEVI, 1 application by Does not apply route every 14 (fourteen) days., Disp: 1 each, Rfl: 0   Continuous Blood Gluc Sensor (FREESTYLE LIBRE 3 SENSOR) MISC, 1 application by Does not apply route every 14 (fourteen) days. Place 1 sensor on the skin every 14 days. Use to check glucose continuously, Disp: 2 each, Rfl: 5   furosemide (LASIX) 80 MG tablet, Take 1 tablet (80 mg total) by mouth 2 (two) times daily., Disp: 60 tablet, Rfl: 6    metolazone (ZAROXOLYN) 2.5 MG tablet, Take 1 tablet (2.5 mg total) by mouth as directed. Per Heart Failure Team instructions. (Patient not taking: Reported on 04/22/2021), Disp: 6 tablet, Rfl: 3   metoprolol tartrate (LOPRESSOR) 100 MG tablet, Take 1 tablet (100 mg total) two hours prior to CT testing. (Patient not taking: Reported on 04/22/2021), Disp: 1 tablet, Rfl: 0 Allergies  Allergen Reactions   Lisinopril Cough      Social History   Socioeconomic History   Marital status: Legally Separated    Spouse name: Not on file   Number of children: Not on file   Years of education: Not on file   Highest education level: Not on file  Occupational History   Not on file  Tobacco Use   Smoking status: Never   Smokeless tobacco: Never  Vaping Use   Vaping Use: Never used  Substance and Sexual Activity   Alcohol use: Not Currently    Alcohol/week: 1.0 - 2.0 standard drink    Types: 1 - 2 Glasses of wine per week    Comment: rare   Drug use: No   Sexual activity: Yes    Birth control/protection: None  Other Topics Concern   Not on file  Social History Narrative   Not on file   Social Determinants of Health   Financial Resource Strain: Not on file  Food Insecurity: Not on file  Transportation Needs: Not on file  Physical Activity: Not on file  Stress: Not on file  Social Connections: Not on file  Intimate Partner Violence: Not on file    Physical Exam      Future Appointments  Date Time Provider [place]  05/25/2021 10:40 AM Larey Dresser, MD [facility] None  05/27/2021  8:45 AM Frann Rider, NP [facility] None  06/08/2021  8:30 AM Constance Haw, MD [facility]  06/21/2021  8:35 AM [facility] DEVICE REMOTES [facility]  07/26/2021 11:00 AM Vevelyn Francois, NP [place] None  09/20/2021  8:35 AM [facility] DEVICE REMOTES [facility]  12/20/2021  8:35 AM [facility] DEVICE REMOTES [facility]        [name], EMT-Paramedic [place] Paramedic  04/22/21

## 2021-05-03 ENCOUNTER — Telehealth (HOSPITAL_COMMUNITY): Payer: Self-pay

## 2021-05-03 NOTE — Telephone Encounter (Signed)
[facility] to request the following for refills:   Carvedilol     [name], EMT-Paramedic 05/03/21

## 2021-05-06 ENCOUNTER — Other Ambulatory Visit (HOSPITAL_COMMUNITY): Payer: Self-pay

## 2021-05-06 ENCOUNTER — Telehealth: Payer: Self-pay | Admitting: Clinical

## 2021-05-06 NOTE — Progress Notes (Signed)
Paramedicine Encounter    Patient ID: Troy Rush, male    DOB: 04/16/1970, 51 y.o.   MRN: [id number]  Pt reports feeling ok, he is still having episodes of dizziness with the hydralazine dose decrease.  Home b/p readings range from 67/MCNOBSJG to 283/MOQHUTML  He did miss a couple days of meds the past 2 wks   Meds verified and 2 wks of pill boxes refilled.  He did take meds this morning approx 30-32min prior to my arrival.  He does have a smidgen of edema to his legs, he ate spaghetti and meatballs for dinner last night.  -he has not got his freestyle meter yet   Refills needed: Farxiga Repatha  Lasix-90 day supply  Meclizine-90day supply? Vascepa-90 day supply   BP (!) 150/92    Pulse 69    Resp 18    Wt 241 lb (109.3 kg)    SpO2 98%    BMI 34.58 kg/m  Weight yesterday-240 Last visit weight-243  Patient Care Team: Vevelyn Francois, NP as PCP - General (Adult Health Nurse Practitioner) Larey Dresser, MD as PCP - Cardiology (Cardiology) Conrad [place], NP as Nurse Practitioner (Cardiology) Larey Dresser, MD as Consulting Physician (Cardiology) Jorge Ny, LCSW as Social Worker (Licensed Clinical Social Worker)  Patient Active Problem List   Diagnosis Date Noted   Subcortical infarction [facility]) 10/03/2020   Cerebral thrombosis with cerebral infarction 10/01/2020   Unilateral weakness 09/30/2020   History of atrial fibrillation 09/30/2020   History of DVT (deep vein thrombosis) 09/30/2020   CKD (chronic kidney disease) stage 3, GFR 30-59 ml/min ([place]) 07/08/2020   Acute CHF (congestive heart failure) ([place]) 07/07/2020   CKD (chronic kidney disease), stage IV ([place]) 07/07/2020   ICD (implantable cardioverter-defibrillator) in place 05/14/2020   Pulmonary embolism ([place]) 02/05/2020   Acute kidney injury superimposed on CKD ([place])    Chronic combined systolic and diastolic heart failure ([place])    Atrial fibrillation with RVR ([place]) 02/01/2020   NSVT (nonsustained  ventricular tachycardia) 02/01/2020   Acute GI bleeding 02/01/2020   DKA (diabetic ketoacidosis) ([place]) 02/01/2020   Bilateral deafness 08/15/2019   Type 2 diabetes mellitus with complication, with long-term current use of insulin ([place]) 08/15/2019   Hemoglobin A1C between 7% and 9% indicating borderline diabetic control ([place]) 08/15/2019   Insomnia 08/15/2019   Abdominal pain 11/14/2018   Ischemic cardiomyopathy 10/14/2016   NICM (nonischemic cardiomyopathy) ([place]) 46/50/3546   Chronic systolic CHF (congestive heart failure) ([place]) 05/30/2016   HTN (hypertension) 05/30/2016   Deaf 05/30/2016   Hyperkalemia 05/30/2016   Major depressive disorder, recurrent episode ([place]) 03/21/2016   Bronchitis 01/21/2015   Dyslipidemia 01/21/2015   Vitamin D deficiency 12/19/2011   Personal history of other infectious and parasitic diseases 10/25/2011   Anxiety disorder 05/12/2011   Hyperlipidemia 05/12/2011   Hypertriglyceridemia 05/12/2011   Obstructive sleep apnea 05/12/2011    Current Outpatient Medications:    acetaminophen (TYLENOL) 325 MG tablet, Take 2 tablets (650 mg total) by mouth every 4 (four) hours as needed for mild pain (or temp > 37.5 C (99.5 F))., Disp: , Rfl:    amiodarone (PACERONE) 200 MG tablet, Take 1 tablet (200 mg total) by mouth daily., Disp: 60 tablet, Rfl: 6   apixaban (ELIQUIS) 5 MG TABS tablet, TAKE 1 TABLET(5 MG) BY MOUTH TWICE DAILY, Disp: 60 tablet, Rfl: 11   atorvastatin (LIPITOR) 80 MG tablet, Take 1 tablet (80 mg total) by mouth at bedtime., Disp: 30 tablet, Rfl:  11   carvedilol (COREG) 25 MG tablet, TAKE 1 TABLET (25 MG TOTAL) BY MOUTH 2 (TWO) TIMES DAILY., Disp: 60 tablet, Rfl: 0   Cholecalciferol (D3-1000 PO), Take 1,000 Units by mouth daily., Disp: , Rfl:    dapagliflozin propanediol (FARXIGA) 10 MG TABS tablet, Take 1 tablet (10 mg total) by mouth daily before breakfast., Disp: 90 tablet, Rfl: 3   Evolocumab (REPATHA SURECLICK) 673 MG/ML SOAJ, Inject 140 mg into  the skin every 14 (fourteen) days., Disp: 2 mL, Rfl: 11   fenofibrate 54 MG tablet, Take 54 mg by mouth daily., Disp: , Rfl:    hydrALAZINE (APRESOLINE) 100 MG tablet, Take 1 tablet (100 mg total) by mouth 3 (three) times daily. (Patient taking differently: Take 100 mg by mouth 3 (three) times daily.), Disp: 270 tablet, Rfl: 3   insulin glargine (LANTUS SOLOSTAR) 100 UNIT/ML Solostar Pen, Inject 35 Units into the skin at bedtime., Disp: 31.5 mL, Rfl: 3   Insulin Pen Needle (PEN NEEDLES) 31G X 6 MM MISC, 1 pen by Does not apply route daily., Disp: 100 each, Rfl: 11   isosorbide mononitrate (IMDUR) 60 MG 24 hr tablet, Take 1 tablet (60 mg total) by mouth daily., Disp: 30 tablet, Rfl: 11   latanoprost (XALATAN) 0.005 % ophthalmic solution, Place 1 drop into both eyes at bedtime., Disp: , Rfl:    meclizine (ANTIVERT) 25 MG tablet, Take 1 tablet (25 mg total) by mouth 3 (three) times daily as needed for dizziness. (Patient taking differently: Take 25 mg by mouth 3 (three) times daily.), Disp: 90 tablet, Rfl: 3   pantoprazole (PROTONIX) 40 MG tablet, Take 1 tablet (40 mg total) by mouth 2 (two) times daily., Disp: 180 tablet, Rfl: 3   sertraline (ZOLOFT) 50 MG tablet, Take 1 tablet (50 mg total) by mouth daily., Disp: 90 tablet, Rfl: 1   VASCEPA 1 g capsule, Take 2 capsules (2 g total) by mouth 2 (two) times daily., Disp: 120 capsule, Rfl: 5   Continuous Blood Gluc Receiver (FREESTYLE LIBRE 2 READER) DEVI, 1 application by Does not apply route every 14 (fourteen) days. (Patient not taking: Reported on 05/06/2021), Disp: 1 each, Rfl: 0   Continuous Blood Gluc Sensor (FREESTYLE LIBRE 3 SENSOR) MISC, 1 application by Does not apply route every 14 (fourteen) days. Place 1 sensor on the skin every 14 days. Use to check glucose continuously (Patient not taking: Reported on 05/06/2021), Disp: 2 each, Rfl: 5   furosemide (LASIX) 80 MG tablet, Take 1 tablet (80 mg total) by mouth 2 (two) times daily., Disp: 60 tablet,  Rfl: 6   metolazone (ZAROXOLYN) 2.5 MG tablet, Take 1 tablet (2.5 mg total) by mouth as directed. Per Heart Failure Team instructions. (Patient not taking: Reported on 04/22/2021), Disp: 6 tablet, Rfl: 3   metoprolol tartrate (LOPRESSOR) 100 MG tablet, Take 1 tablet (100 mg total) two hours prior to CT testing. (Patient not taking: Reported on 04/22/2021), Disp: 1 tablet, Rfl: 0 Allergies  Allergen Reactions   Lisinopril Cough      Social History   Socioeconomic History   Marital status: Legally Separated    Spouse name: Not on file   Number of children: Not on file   Years of education: Not on file   Highest education level: Not on file  Occupational History   Not on file  Tobacco Use   Smoking status: Never   Smokeless tobacco: Never  Vaping Use   Vaping Use: Never used  Substance and  Sexual Activity   Alcohol use: Not Currently    Alcohol/week: 1.0 - 2.0 standard drink    Types: 1 - 2 Glasses of wine per week    Comment: rare   Drug use: No   Sexual activity: Yes    Birth control/protection: None  Other Topics Concern   Not on file  Social History Narrative   Not on file   Social Determinants of Health   Financial Resource Strain: Not on file  Food Insecurity: Not on file  Transportation Needs: Not on file  Physical Activity: Not on file  Stress: Not on file  Social Connections: Not on file  Intimate Partner Violence: Not on file    Physical Exam      Future Appointments  Date Time Provider [place]  05/25/2021 10:40 AM Larey Dresser, MD [facility] None  05/27/2021  8:45 AM Frann Rider, NP [facility] None  06/08/2021  8:30 AM Constance Haw, MD [facility]  06/17/2021  2:20 PM Sueanne Margarita, MD [facility]  06/21/2021  8:35 AM [facility] DEVICE REMOTES [facility]  07/26/2021 11:00 AM Vevelyn Francois, NP [facility] None  09/20/2021  8:35 AM [facility] DEVICE REMOTES [facility]  12/20/2021   8:35 AM [facility] DEVICE REMOTES [facility]       [name], [facility] [place] [facility] Paramedic  05/06/21

## 2021-05-07 NOTE — Telephone Encounter (Signed)
[facility] General Follow Up Note  05/07/2021 Name: Troy Rush MRN: [id number] DOB: 1970/07/11 Troy Rush is a 51 y.o. year old male who sees Vevelyn Francois, NP for primary care. LCSW was consulted to assess patient's needs and assist the patient with Financial Difficulties related to low income and overdue utility bills.  Interpreter: No.   Interpreter Name & Language: none - patient is deaf and requires ASL interpreter, but had requested to communicate via email when he is not in the office  Assessment: Patient experiencing financial difficulties related to low income.  Ongoing Intervention: CSW communicated with patient via email, per patient's request. Patient requested assistance with overdue utility bills. Submitted request to [facility] patient fund. Patient approved for a some of the amount due, as he received rental assistance from the [employer] last summer. Coordinated with [facility]) practice administrator to pay this portion.   Review of patient status, including review of consultants reports, relevant laboratory and other test results, and collaboration with appropriate care team members and the patient's provider was performed as part of comprehensive patient evaluation and provision of services.    Estanislado Emms, [facility] [phone]

## 2021-05-18 ENCOUNTER — Telehealth (HOSPITAL_COMMUNITY): Payer: Self-pay

## 2021-05-18 ENCOUNTER — Other Ambulatory Visit (HOSPITAL_COMMUNITY): Payer: Self-pay

## 2021-05-18 MED ORDER — FUROSEMIDE 80 MG PO TABS: 80.0000 mg | ORAL_TABLET | Freq: Two times a day (BID) | ORAL | 3 refills | Status: DC

## 2021-05-18 NOTE — Telephone Encounter (Signed)
[place] to request the following for refills:   Farxiga Repatha  Lasix-90 day supply? Meclizine-90day supply? Vascepa-90 day supply?  [name], EMT-Paramedic 05/18/21

## 2021-05-20 ENCOUNTER — Other Ambulatory Visit (HOSPITAL_COMMUNITY): Payer: Self-pay

## 2021-05-20 NOTE — Progress Notes (Signed)
Paramedicine Encounter    Patient ID: Troy Rush, male    DOB: [birth year], 51 y.o.   MRN: [id number]  Pt reports he is doing well. He denies sob. No dizziness.  No c/p.  Weight same.   He has not p/u that freestyle yet.  He has no txp to get there and unsure about walking up there to get it. I may get it for him, I will check on co-pay costs first.  Reminded him of his upcoming appointments next week-he has sch medicaid txp for those already.  Denies bleeding issues.  Just a small trace of edema to his ankles.  He reports sometimes the CPAP works fine and sometimes it leaks, I advised him to contact the company or doctor office.  Meds verified and 2 wks of pill boxes refilled.    Refills needed- Pantoprazole  BP 110/74    Pulse 74    Resp 18    Wt 242 lb (109.8 kg)    SpO2 97%    BMI 34.72 kg/m  Weight yesterday-? Last visit weight-241  Patient Care Team: Vevelyn Francois, NP as PCP - General (Adult Health Nurse Practitioner) Larey Dresser, MD as PCP - Cardiology (Cardiology) Conrad [place], NP as Nurse Practitioner (Cardiology) Larey Dresser, MD as Consulting Physician (Cardiology) Jorge Ny, LCSW as Social Worker (Licensed Clinical Social Worker)  Patient Active Problem List   Diagnosis Date Noted   Subcortical infarction [facility]) 10/03/2020   Cerebral thrombosis with cerebral infarction 10/01/2020   Unilateral weakness 09/30/2020   History of atrial fibrillation 09/30/2020   History of DVT (deep vein thrombosis) 09/30/2020   CKD (chronic kidney disease) stage 3, GFR 30-59 ml/min ([place]) 07/08/2020   Acute CHF (congestive heart failure) ([place]) 07/07/2020   CKD (chronic kidney disease), stage IV ([place]) 07/07/2020   ICD (implantable cardioverter-defibrillator) in place 05/14/2020   Pulmonary embolism ([place]) 02/05/2020   Acute kidney injury superimposed on CKD ([place])    Chronic combined systolic and diastolic heart failure ([place])    Atrial fibrillation with RVR ([place])  02/01/2020   NSVT (nonsustained ventricular tachycardia) 02/01/2020   Acute GI bleeding 02/01/2020   DKA (diabetic ketoacidosis) ([place]) 02/01/2020   Bilateral deafness 08/15/2019   Type 2 diabetes mellitus with complication, with long-term current use of insulin ([place]) 08/15/2019   Hemoglobin A1C between 7% and 9% indicating borderline diabetic control ([place]) 08/15/2019   Insomnia 08/15/2019   Abdominal pain 11/14/2018   Ischemic cardiomyopathy 10/14/2016   NICM (nonischemic cardiomyopathy) ([place]) 22/05/5425   Chronic systolic CHF (congestive heart failure) ([place]) 05/30/2016   HTN (hypertension) 05/30/2016   Deaf 05/30/2016   Hyperkalemia 05/30/2016   Major depressive disorder, recurrent episode ([place]) 03/21/2016   Bronchitis 01/21/2015   Dyslipidemia 01/21/2015   Vitamin D deficiency 12/19/2011   Personal history of other infectious and parasitic diseases 10/25/2011   Anxiety disorder 05/12/2011   Hyperlipidemia 05/12/2011   Hypertriglyceridemia 05/12/2011   Obstructive sleep apnea 05/12/2011    Current Outpatient Medications:    acetaminophen (TYLENOL) 325 MG tablet, Take 2 tablets (650 mg total) by mouth every 4 (four) hours as needed for mild pain (or temp > 37.5 C (99.5 F))., Disp: , Rfl:    amiodarone (PACERONE) 200 MG tablet, Take 1 tablet (200 mg total) by mouth daily., Disp: 60 tablet, Rfl: 6   apixaban (ELIQUIS) 5 MG TABS tablet, TAKE 1 TABLET(5 MG) BY MOUTH TWICE DAILY, Disp: 60 tablet, Rfl: 11   atorvastatin (LIPITOR) 80 MG  tablet, Take 1 tablet (80 mg total) by mouth at bedtime., Disp: 30 tablet, Rfl: 11   carvedilol (COREG) 25 MG tablet, TAKE 1 TABLET (25 MG TOTAL) BY MOUTH 2 (TWO) TIMES DAILY., Disp: 60 tablet, Rfl: 0   Cholecalciferol (D3-1000 PO), Take 1,000 Units by mouth daily., Disp: , Rfl:    dapagliflozin propanediol (FARXIGA) 10 MG TABS tablet, Take 1 tablet (10 mg total) by mouth daily before breakfast., Disp: 90 tablet, Rfl: 3   Evolocumab (REPATHA SURECLICK) 154  MG/ML SOAJ, Inject 140 mg into the skin every 14 (fourteen) days., Disp: 2 mL, Rfl: 11   fenofibrate 54 MG tablet, Take 54 mg by mouth daily., Disp: , Rfl:    furosemide (LASIX) 80 MG tablet, Take 1 tablet (80 mg total) by mouth 2 (two) times daily., Disp: 90 tablet, Rfl: 3   hydrALAZINE (APRESOLINE) 100 MG tablet, Take 1 tablet (100 mg total) by mouth 3 (three) times daily. (Patient taking differently: Take 100 mg by mouth 3 (three) times daily.), Disp: 270 tablet, Rfl: 3   insulin glargine (LANTUS SOLOSTAR) 100 UNIT/ML Solostar Pen, Inject 35 Units into the skin at bedtime., Disp: 31.5 mL, Rfl: 3   Insulin Pen Needle (PEN NEEDLES) 31G X 6 MM MISC, 1 pen by Does not apply route daily., Disp: 100 each, Rfl: 11   isosorbide mononitrate (IMDUR) 60 MG 24 hr tablet, Take 1 tablet (60 mg total) by mouth daily., Disp: 30 tablet, Rfl: 11   latanoprost (XALATAN) 0.005 % ophthalmic solution, Place 1 drop into both eyes at bedtime., Disp: , Rfl:    meclizine (ANTIVERT) 25 MG tablet, Take 1 tablet (25 mg total) by mouth 3 (three) times daily as needed for dizziness. (Patient taking differently: Take 25 mg by mouth 3 (three) times daily.), Disp: 90 tablet, Rfl: 3   pantoprazole (PROTONIX) 40 MG tablet, Take 1 tablet (40 mg total) by mouth 2 (two) times daily., Disp: 180 tablet, Rfl: 3   sertraline (ZOLOFT) 50 MG tablet, Take 1 tablet (50 mg total) by mouth daily., Disp: 90 tablet, Rfl: 1   VASCEPA 1 g capsule, Take 2 capsules (2 g total) by mouth 2 (two) times daily., Disp: 120 capsule, Rfl: 5   Continuous Blood Gluc Receiver (FREESTYLE LIBRE 2 READER) DEVI, 1 application by Does not apply route every 14 (fourteen) days. (Patient not taking: Reported on 05/06/2021), Disp: 1 each, Rfl: 0   Continuous Blood Gluc Sensor (FREESTYLE LIBRE 3 SENSOR) MISC, 1 application by Does not apply route every 14 (fourteen) days. Place 1 sensor on the skin every 14 days. Use to check glucose continuously (Patient not taking: Reported  on 05/06/2021), Disp: 2 each, Rfl: 5   metolazone (ZAROXOLYN) 2.5 MG tablet, Take 1 tablet (2.5 mg total) by mouth as directed. Per Heart Failure Team instructions. (Patient not taking: Reported on 04/22/2021), Disp: 6 tablet, Rfl: 3   metoprolol tartrate (LOPRESSOR) 100 MG tablet, Take 1 tablet (100 mg total) two hours prior to CT testing. (Patient not taking: Reported on 04/22/2021), Disp: 1 tablet, Rfl: 0 Allergies  Allergen Reactions   Lisinopril Cough      Social History   Socioeconomic History   Marital status: Legally Separated    Spouse name: Not on file   Number of children: Not on file   Years of education: Not on file   Highest education level: Not on file  Occupational History   Not on file  Tobacco Use   Smoking status: Never  Smokeless tobacco: Never  Vaping Use   Vaping Use: Never used  Substance and Sexual Activity   Alcohol use: Not Currently    Alcohol/week: 1.0 - 2.0 standard drink    Types: 1 - 2 Glasses of wine per week    Comment: rare   Drug use: No   Sexual activity: Yes    Birth control/protection: None  Other Topics Concern   Not on file  Social History Narrative   Not on file   Social Determinants of Health   Financial Resource Strain: Not on file  Food Insecurity: Not on file  Transportation Needs: Not on file  Physical Activity: Not on file  Stress: Not on file  Social Connections: Not on file  Intimate Partner Violence: Not on file    Physical Exam      Future Appointments  Date Time Provider [place]  05/25/2021 10:40 AM Larey Dresser, MD [facility] None  05/27/2021  8:45 AM Frann Rider, NP [facility] None  06/08/2021  8:30 AM Constance Haw, MD [facility]  06/17/2021  2:20 PM Sueanne Margarita, MD [facility]  06/21/2021  8:35 AM [facility] DEVICE REMOTES [facility]  07/26/2021 11:00 AM Vevelyn Francois, NP [place] None  09/20/2021  8:35 AM [facility] DEVICE REMOTES  [facility]  12/20/2021  8:35 AM [facility] DEVICE REMOTES [facility]       [name], [place] [facility] Paramedic  05/20/21

## 2021-05-25 ENCOUNTER — Ambulatory Visit (HOSPITAL_COMMUNITY)
Admission: RE | Admit: 2021-05-25 | Discharge: 2021-05-25 | Disposition: A | Discharge status: Home / Self Care | Payer: Medicare Other | Source: Ambulatory Visit | Attending: Cardiology | Admitting: Cardiology

## 2021-05-25 ENCOUNTER — Other Ambulatory Visit: Payer: Self-pay

## 2021-05-25 ENCOUNTER — Encounter (HOSPITAL_COMMUNITY): Payer: Self-pay | Admitting: Cardiology

## 2021-05-25 VITALS — BP 128/80 | HR 62 | Wt 250.2 lb

## 2021-05-25 DIAGNOSIS — I480 Paroxysmal atrial fibrillation: Secondary | ICD-10-CM | POA: Insufficient documentation

## 2021-05-25 DIAGNOSIS — Z95810 Presence of automatic (implantable) cardiac defibrillator: Secondary | ICD-10-CM | POA: Insufficient documentation

## 2021-05-25 DIAGNOSIS — I5042 Chronic combined systolic (congestive) and diastolic (congestive) heart failure: Secondary | ICD-10-CM | POA: Insufficient documentation

## 2021-05-25 DIAGNOSIS — Z8616 Personal history of COVID-19: Secondary | ICD-10-CM | POA: Insufficient documentation

## 2021-05-25 DIAGNOSIS — K2091 Esophagitis, unspecified with bleeding: Secondary | ICD-10-CM | POA: Diagnosis not present

## 2021-05-25 DIAGNOSIS — Z86718 Personal history of other venous thrombosis and embolism: Secondary | ICD-10-CM | POA: Diagnosis not present

## 2021-05-25 DIAGNOSIS — G4733 Obstructive sleep apnea (adult) (pediatric): Secondary | ICD-10-CM | POA: Diagnosis not present

## 2021-05-25 DIAGNOSIS — F32A Depression, unspecified: Secondary | ICD-10-CM | POA: Diagnosis not present

## 2021-05-25 DIAGNOSIS — H9193 Unspecified hearing loss, bilateral: Secondary | ICD-10-CM | POA: Insufficient documentation

## 2021-05-25 DIAGNOSIS — Z09 Encounter for follow-up examination after completed treatment for conditions other than malignant neoplasm: Secondary | ICD-10-CM | POA: Insufficient documentation

## 2021-05-25 DIAGNOSIS — N184 Chronic kidney disease, stage 4 (severe): Secondary | ICD-10-CM | POA: Diagnosis not present

## 2021-05-25 DIAGNOSIS — R0602 Shortness of breath: Secondary | ICD-10-CM | POA: Diagnosis present

## 2021-05-25 DIAGNOSIS — E1122 Type 2 diabetes mellitus with diabetic chronic kidney disease: Secondary | ICD-10-CM | POA: Diagnosis not present

## 2021-05-25 DIAGNOSIS — I428 Other cardiomyopathies: Secondary | ICD-10-CM | POA: Insufficient documentation

## 2021-05-25 DIAGNOSIS — Z8673 Personal history of transient ischemic attack (TIA), and cerebral infarction without residual deficits: Secondary | ICD-10-CM | POA: Diagnosis not present

## 2021-05-25 DIAGNOSIS — F1010 Alcohol abuse, uncomplicated: Secondary | ICD-10-CM | POA: Diagnosis not present

## 2021-05-25 DIAGNOSIS — Z7901 Long term (current) use of anticoagulants: Secondary | ICD-10-CM | POA: Diagnosis not present

## 2021-05-25 DIAGNOSIS — E785 Hyperlipidemia, unspecified: Secondary | ICD-10-CM | POA: Diagnosis not present

## 2021-05-25 DIAGNOSIS — K2970 Gastritis, unspecified, without bleeding: Secondary | ICD-10-CM | POA: Insufficient documentation

## 2021-05-25 DIAGNOSIS — I5022 Chronic systolic (congestive) heart failure: Secondary | ICD-10-CM | POA: Diagnosis not present

## 2021-05-25 DIAGNOSIS — Z79899 Other long term (current) drug therapy: Secondary | ICD-10-CM | POA: Insufficient documentation

## 2021-05-25 DIAGNOSIS — I4819 Other persistent atrial fibrillation: Secondary | ICD-10-CM | POA: Diagnosis not present

## 2021-05-25 DIAGNOSIS — Z7984 Long term (current) use of oral hypoglycemic drugs: Secondary | ICD-10-CM | POA: Diagnosis not present

## 2021-05-25 DIAGNOSIS — I272 Pulmonary hypertension, unspecified: Secondary | ICD-10-CM | POA: Diagnosis not present

## 2021-05-25 DIAGNOSIS — I130 Hypertensive heart and chronic kidney disease with heart failure and stage 1 through stage 4 chronic kidney disease, or unspecified chronic kidney disease: Secondary | ICD-10-CM | POA: Insufficient documentation

## 2021-05-25 DIAGNOSIS — Z86711 Personal history of pulmonary embolism: Secondary | ICD-10-CM | POA: Diagnosis not present

## 2021-05-25 LAB — COMPREHENSIVE METABOLIC PANEL
ALT: 26 U/L (ref 0–44)
AST: 34 U/L (ref 15–41)
Albumin: 3.9 g/dL (ref 3.5–5.0)
Alkaline Phosphatase: 34 U/L — ABNORMAL LOW (ref 38–126)
Anion gap: 11 (ref 5–15)
BUN: 54 mg/dL — ABNORMAL HIGH (ref 6–20)
CO2: 27 mmol/L (ref 22–32)
Calcium: 8.6 mg/dL — ABNORMAL LOW (ref 8.9–10.3)
Chloride: 102 mmol/L (ref 98–111)
Creatinine, Ser: 3.35 mg/dL — ABNORMAL HIGH (ref 0.61–1.24)
GFR, Estimated: 21 mL/min — ABNORMAL LOW (ref >60)
Glucose, Bld: 102 mg/dL — ABNORMAL HIGH (ref 70–99)
Potassium: 4.1 mmol/L (ref 3.5–5.1)
Sodium: 140 mmol/L (ref 135–145)
Total Bilirubin: 0.4 mg/dL (ref 0.3–1.2)
Total Protein: 7.5 g/dL (ref 6.5–8.1)

## 2021-05-25 LAB — TSH: TSH: 8.278 u[IU]/mL — ABNORMAL HIGH (ref 0.350–4.500)

## 2021-05-25 LAB — CBC
HCT: 34.9 % — ABNORMAL LOW (ref 39.0–52.0)
Hemoglobin: 10.4 g/dL — ABNORMAL LOW (ref 13.0–17.0)
MCH: 27.8 pg (ref 26.0–34.0)
MCHC: 29.8 g/dL — ABNORMAL LOW (ref 30.0–36.0)
MCV: 93.3 fL (ref 80.0–100.0)
Platelets: 188 10*3/uL (ref 150–400)
RBC: 3.74 MIL/uL — ABNORMAL LOW (ref 4.22–5.81)
RDW: 13.6 % (ref 11.5–15.5)
WBC: 3.5 10*3/uL — ABNORMAL LOW (ref 4.0–10.5)
nRBC: 0 % (ref 0.0–0.2)

## 2021-05-25 NOTE — Patient Instructions (Addendum)
EKG done today.  Labs done today. We will contact you only if your labs are abnormal.  No other medication changes were made. Please continue all current medications as prescribed.  Call your CPAP Company for a new CPAP Mask.   Your physician recommends that you schedule a follow-up appointment in: 2 months with our NP/PA Clinic here in our office  If you have any questions or concerns before your next appointment please send [place] a message through [place] or call our office at [phone].    TO LEAVE A MESSAGE FOR THE NURSE SELECT OPTION 2, PLEASE LEAVE A MESSAGE INCLUDING: YOUR NAME DATE OF BIRTH CALL BACK NUMBER REASON FOR CALL**this is important as we prioritize the call backs  YOU WILL RECEIVE A CALL BACK THE SAME DAY AS LONG AS YOU CALL BEFORE 4:00 PM   Do the following things EVERYDAY: Weigh yourself in the morning before breakfast. Write it down and keep it in a log. Take your medicines as prescribed Eat low salt foods--Limit salt (sodium) to 2000 mg per day.  Stay as active as you can everyday Limit all fluids for the day to less than 2 liters   At the [facility], you and your health needs are our priority. As part of our continuing mission to provide you with exceptional heart care, we have created designated Provider Care Teams. These Care Teams include your primary Cardiologist (physician) and Advanced Practice Providers (APPs- Physician Assistants and Nurse Practitioners) who all work together to provide you with the care you need, when you need it.   You may see any of the following providers on your designated Care Team at your next follow up: Dr Glori Bickers Dr Haynes Kerns, NP Lyda Jester, [place] Audry Riles, PharmD   Please be sure to bring in all your medications bottles to every appointment.

## 2021-05-26 ENCOUNTER — Telehealth (HOSPITAL_COMMUNITY): Payer: Self-pay | Admitting: Licensed Clinical Social Worker

## 2021-05-26 NOTE — Progress Notes (Signed)
Advanced Heart Failure Clinic Note   PCP: Kathe Becton, FNP HF Cardiology: Dr. Aundra Dubin  Reason for Visit: f/u for chronic systolic heart failure   Troy Rush is a 51 y.o. with a history of with deafness, HTN, DM, and chronic systolic HF.   Patient had no known cardiac problems until 10/16.  At that time, he developed exertional dyspnea and was admitted to a hospital in [place], [place] with acute systolic CHF.  Echo 10/16 showed EF 15-20%.  Cardiac cath showed no significant coronary disease.  He was diuresed and started on cardiac meds. Subsequently, he moved to [facility].  Now working in a [employer].  Repeat echo in 5/18 showed persistently low EF, 25-30%.  He had [place] placed.   He was admitted briefly with CHF exacerbation in 8/20.    Echo in 9/20 showed EF 30-35% with diffuse hypokinesis.   Ultrasound in 4/21 showed right leg DVT and CT showed a PE.  No obvious trigger (no long trip, no surgery, no prolonged immobility).  He was started on apixaban.   He quit taking his meds in 8/21 and was drinking heavily due to depression, later restarted meds.  In 10/21, he was admitted with DKA and atrial fibrillation/RVR (1st known episode).  He converted to NSR spontaneously.  [name] was stopped due to AKI.  He had melena/hematochezia and EGD showed gastritis/esophagitis.  Apixaban was stopped.  He went home but then was re-admitted later in 10/21 with bilateral PEs.   Echo in 10/21 showed EF 45%, mild global hypokinesis, moderate LVH, mildly decreased RV systolic function.    On 05/05/20, he had a near-syncopal event and went to the ER, no events on ICD interrogation.  ?Orthostatic.  No further dizziness/lightheadedness.  Repeat RHC (2/22) showed optimized filling pressures, mild pulmonary hypertension, preserved cardiac output. Suspect elevated PA pressure may be related to OSA.    Admitted 05/7739  With A/C systolic heart failure. Diuresed with IV lasix and transitioned to  lasix 60 mg daily. Discharge weight 232 pounds.   09/10/20 he presented for scheduled cardiomems and was noted to be in A fib RVR. RHC with succesful cardiomems implant. Stable hemodynamics on cath. Admitted from cath lab with A fib RVR and started on amio drip. Chemically converted to NSR was transitioned to amio 200 mg bid.  Also incidentally found to be COVID +.   09/30/20, he was admitted w/ acute subcortical CVA. Unable to get cMRI given ICD. Neurology felt related to small vessel disease. Eliquis continued. He was d/c to CIR for rehab.  Echo in 6/22 showed EF 35-40%.    He had atrial fibrillation ablation in 11/22.   Today he returns for HF follow up with interpretor. He is in NSR today, denies palpitations.  He is taking his meds with help from paramedicine.  He is not short of breath walking on flat ground. He does get short of breath walking up stairs.  No chest pain.  No orthopnea/PND. He continues to have occasional orthostatic symptoms, no falls.   Cardiomems 22, this is at goal.  ECG (personally reviewed): NSR, lateral TWIs  Labs (9/17): BNP 477, K 4.5 => 4.2, creatinine 0.92 => 1.1, BNP 1009, HIV negative, SPEP negative.  Labs (10/17): K 5.2, creatinine 1.10, hgb 12.5 Labs (03/28/2016): K 5.3 Creatinine 1.07  Labs (3/18): digoxin < 0.2, K 4.1, creatinine 0.97 Labs (9/18): hgb 12.2, K 4, creatinine 0.99, BNP 139 Labs (1/19): hgb 11.2, K 4.8, creatinine 1.26 Labs (8/20):  K 3.7, creatinine 1.69 Labs (5/21): K 5, creatinine 1.4, LDL 192 Labs (10/21): K 4.1, creatinine 1.77, LDL 180, TGs 138 Labs (11/21): K 4.5, creatinine 2.68 Labs (12/21): K 5.1, creatinine 2.25 Labs (04/30/20): K 4.7, creatinine 2.7 Labs (05/05/20): K 4.0, creatinine 2.15 Labs (05/11/20): K 4.2, creatinine 2.43, LDL 76, HDL 38, TGs 220 Labs (2/22): K 4.4, creatinine 2.69 Labs (2/22): SCr 2.56, GFR 29, K 3.9  Labs (8/22): K 5.0, creatinine 2.84, LDL 205, HDL 42, TGs 384 Labs (9/22): K 4.4, creatinine 3.06 Labs  (1/23): K 4.5, creatinine 3.19  PMH: 1. HTN 2. Diabetes 3. Hyperlipidemia 4. Deafness since age 42: Needs sign language interpreter.  5. Chronic systolic CHF: Nonischemic cardiomyopathy.  Diagnosis 10/16 in [place], [place] (admitted with acute systolic CHF).  SPEP and HIV negative.  Cloud.  - Echo (10/16): EF 15-20%, mild LVH, mild [place], normal RV size and systolic function.  - LHC (10/16): No significant CAD.  - CPX (12/17): Mild functional limitation.  - 04/2016 CMRI: EF 27%. RV normal. No evidence of infiltrative disease, myocarditis, or MI.  - Echo (5/18): EF 25-30%, mild LV dilation, mild LVH, normal RV size with mildly decreased - Echo (9/20): EF 30-35%, mild LVH, diffuse hypokinesis, normal RV size and systolic function.  - Echo (10/21): EF 45%, mild global hypokinesis, moderate LVH, mildly decreased RV systolic function.  - RHC (2/22): Optimized filling pressures, mild pulmonary hypertension, preserved cardiac output. Continue current diuretics.  Suspect elevated PA pressure may be related to OSA.   - Echo (6/22): EF 35-40%, global hypokinesis, normal RV.  - Cardiomems 6. Depression. 7. Colitis episodes.  8. CKD stage 4  9. DVT/PE in 4/21: No obvious trigger.  - Recurrent PE in 10/21, had been off anticoagulation.  10. Atrial fibrillation: Paroxysmal.  - Atrial fibrillation ablation in 11/22.  11. Gastritis/esophagitis: 10/21.  12. ETOH abuse 13. OSA: Cpap 14. CVA 6/22  SH: From [place] originally.  Lives in [place] until 2017, then moved to [place].  Nonsmoker.  Unemployed.  H/o ETOH abuse.    FH: No known cardiac disease.   ROS: All systems reviewed and negative except as per HPI.   Current Outpatient Medications  Medication Sig Dispense Refill   acetaminophen (TYLENOL) 325 MG tablet Take 2 tablets (650 mg total) by mouth every 4 (four) hours as needed for mild pain (or temp > 37.5 C (99.5 F)).     amiodarone (PACERONE) 200 MG tablet Take 1 tablet (200 mg  total) by mouth daily. 60 tablet 6   apixaban (ELIQUIS) 5 MG TABS tablet TAKE 1 TABLET(5 MG) BY MOUTH TWICE DAILY 60 tablet 11   atorvastatin (LIPITOR) 80 MG tablet Take 1 tablet (80 mg total) by mouth at bedtime. 30 tablet 11   carvedilol (COREG) 25 MG tablet TAKE 1 TABLET (25 MG TOTAL) BY MOUTH 2 (TWO) TIMES DAILY. 60 tablet 0   Cholecalciferol (D3-1000 PO) Take 1,000 Units by mouth daily.     Continuous Blood Gluc Receiver (FREESTYLE LIBRE 2 READER) DEVI 1 application by Does not apply route every 14 (fourteen) days. 1 each 0   Continuous Blood Gluc Sensor (FREESTYLE LIBRE 3 SENSOR) MISC 1 application by Does not apply route every 14 (fourteen) days. Place 1 sensor on the skin every 14 days. Use to check glucose continuously 2 each 5   dapagliflozin propanediol (FARXIGA) 10 MG TABS tablet Take 1 tablet (10 mg total) by mouth daily before breakfast. 90 tablet 3   Evolocumab (  REPATHA SURECLICK) 284 MG/ML SOAJ Inject 140 mg into the skin every 14 (fourteen) days. 2 mL 11   fenofibrate 54 MG tablet Take 54 mg by mouth daily.     furosemide (LASIX) 80 MG tablet Take 1 tablet (80 mg total) by mouth 2 (two) times daily. 90 tablet 3   hydrALAZINE (APRESOLINE) 50 MG tablet Take 50 mg by mouth 3 (three) times daily.     Insulin Pen Needle (PEN NEEDLES) 31G X 6 MM MISC 1 pen by Does not apply route daily. 100 each 11   isosorbide mononitrate (IMDUR) 60 MG 24 hr tablet Take 1 tablet (60 mg total) by mouth daily. 30 tablet 11   latanoprost (XALATAN) 0.005 % ophthalmic solution Place 1 drop into both eyes at bedtime.     meclizine (ANTIVERT) 25 MG tablet Take 1 tablet (25 mg total) by mouth 3 (three) times daily as needed for dizziness. 90 tablet 3   metolazone (ZAROXOLYN) 2.5 MG tablet Take 1 tablet (2.5 mg total) by mouth as directed. Per Heart Failure Team instructions. 6 tablet 3   pantoprazole (PROTONIX) 40 MG tablet Take 1 tablet (40 mg total) by mouth 2 (two) times daily. 180 tablet 3   sertraline  (ZOLOFT) 50 MG tablet Take 1 tablet (50 mg total) by mouth daily. 90 tablet 1   VASCEPA 1 g capsule Take 2 capsules (2 g total) by mouth 2 (two) times daily. 120 capsule 5   insulin glargine (LANTUS SOLOSTAR) 100 UNIT/ML Solostar Pen Inject 35 Units into the skin at bedtime. 31.5 mL 3   No current facility-administered medications for this encounter.   BP 128/80    Pulse 62    Wt 113.5 kg (250 lb 3.2 oz)    SpO2 96%    BMI 35.90 kg/m    Wt Readings from Last 3 Encounters:  05/25/21 113.5 kg (250 lb 3.2 oz)  05/20/21 109.8 kg (242 lb)  05/06/21 109.3 kg (241 lb)    PHYSICAL EXAM: General: NAD Neck: No JVD, no thyromegaly or thyroid nodule.  Lungs: Clear to auscultation bilaterally with normal respiratory effort. CV: Nondisplaced PMI.  Heart regular S1/S2, no S3/S4, no murmur.  No peripheral edema.  No carotid bruit.  Normal pedal pulses.  Abdomen: Soft, nontender, no hepatosplenomegaly, no distention.  Skin: Intact without lesions or rashes.  Neurologic: Alert and oriented x 3.  Psych: Normal affect. Extremities: No clubbing or cyanosis.  HEENT: Normal.   Assessment/Plan: 1. Chronic systolic CHF: Nonischemic cardiomyopathy, etiology uncertain. No CAD on 10/16 cath. Consider prior ETOH or HTN.  He has a [place].  cMRI in 1/18 with EF 27%, RV normal, no evidence of infiltrative disease, myocarditis, or MI. Echo in 9/20 showed EF 30-35%.  Echo in 10/21 showed EF up to 45% with mild RV dysfunction. Repeat RHC (2/22) with optimized filling pressures, preserved CO and moderate pulmonary hypertension-->likely due to untreated sleep apnea. Cardiomems placed 6/22.  Echo in 6/22 with EF 35-40%.  He is not volume overloaded by exam or Cardiomems today.  NYHA class II-III. CHF is complicated by CKD stage IV.  - Continue Lasix 80 mg bid.  BMET today.  - Continue Farxiga 10 mg daily. - Continue Coreg 25 mg bid.    - Continue hydralazine 50 mg tid + Imdur 60 mg daily.  With orthostatic  symptoms at times, I will not increase hydralazine further.  - Off Entresto and spironolactone with elevated creatinine.  - Advised to improve compliance w/  cardiomems.  2. Type II diabetes: management per PCP.\ - On Farxiga. 3. HTN: Controlled. - Continue meds as above. 4. Deafness: Sign language interpreter present at visit.  5. CKD: Stage 4.  - BMET today. - He is now followed by Nephrology. 6. PE/DVT: 4/21.  Unclear cause, no prolonged trip, prolonged immobility, or history of malignancy. No FH clotting. Recurrent PE in 10/21 while Eliquis on hold with bleeding from esophagitis/gastritis.  Do no think this was an Eliquis failure.  - Continue apixaban, think he will need long-term with no clear trigger for VTE + AFib. No bleeding issues.  7. Hyperlipidemia: On atorvastatin, fenofibrate and Vascepa.  8. Atrial fibrillation: First noted in setting of DKA in 10/21.  Had been on amiodarone but discontinued 5/22. Had recurrent Afib 6/22 and amiodarone restarted. He had AF ablation in 11/22.  He is in NSR today. - Continue amiodarone 200 mg daily, hopefully can stop soon as we get farther out from AF ablation.  Check LFTs/TSH today, needs to arrange for regular eye exam.  - Continue Eliquis. CBC today.  9. ETOH abuse: ETOH may have played a role in both cardiomyopathy and atrial fibrillation.  No longer drinking alcohol, lives with roommate who regularly drinks ETOH.  10. Severe OSA: Using CPAP.  11. Noncompliance:  Previous medication noncompliance issues. - Continue HF Paramedicine.  12. CVA: 6/22, subcortical CVA. Unable to get cMRI given ICD. Neurology felt related to small vessel disease. No residual deficits - Continue Eliquis.   Continue w/ paramedicine.  Followup with APP in 2 months.   Loralie Champagne, MD 05/26/2021

## 2021-05-26 NOTE — Telephone Encounter (Signed)
HF Paramedicine Team Based Care Meeting  HF MD- NA  HF NP - Terry NP-C   [facility] admit within the last 30 days for heart failure? no  Medications concerns? We are currently filling his pillboxes- will try to have him fill his own pill box  Transportation issues ? Gets it through Medicaid  Eligible for discharge? After he is stabilized on medication either able to fill his own pill box or using pill packs  Jorge Ny, [facility] Desk#: [phone] Cell#: [phone]

## 2021-05-27 ENCOUNTER — Encounter: Payer: Self-pay | Admitting: Adult Health

## 2021-05-27 ENCOUNTER — Telehealth (HOSPITAL_COMMUNITY): Payer: Self-pay | Admitting: Cardiology

## 2021-05-27 ENCOUNTER — Ambulatory Visit (INDEPENDENT_AMBULATORY_CARE_PROVIDER_SITE_OTHER): Payer: Medicare Other | Admitting: Adult Health

## 2021-05-27 VITALS — BP 134/66 | HR 68 | Ht 70.0 in | Wt 248.0 lb

## 2021-05-27 DIAGNOSIS — I639 Cerebral infarction, unspecified: Secondary | ICD-10-CM | POA: Diagnosis not present

## 2021-05-27 DIAGNOSIS — I5042 Chronic combined systolic (congestive) and diastolic (congestive) heart failure: Secondary | ICD-10-CM

## 2021-05-27 NOTE — Progress Notes (Signed)
[facility] [address] [place]. [place] [address] [phone]       STROKE FOLLOW UP NOTE  [name] Date of Birth:  12-Mar-1971 Medical Record Number:  [id number]   Reason for Referral: stroke follow up    SUBJECTIVE:   CHIEF COMPLAINT:  Chief Complaint  Patient presents with   Follow-up    Rm 3 (has [place] ASL interpreter) Pt is well and stable, no new concerns      HPI:   Update 05/27/2021 JM: Patient returns for 33-month stroke follow-up accompanied by ASL [place] interpreter.  Overall stable from stroke standpoint without new or reoccurring stroke/TIA symptoms.  Compliant on Eliquis, fenofibrate, Vascepa and atorvastatin without side effects managed by cardiology.  Blood pressure today 134/66. Glucose levels stable with recent A1c 6.0 down from 7.8.  Routinely followed by PCP and cardiology. S/p A-fib ablation 02/2021. Current use of CPAP for severe OSA monitored by cardiology - has not been able to use over the past couple weeks as currently awaiting for new mask.  No new concerns at this time.    History provided for reference purposes only Initial visit 11/18/2020 JM: Troy Rush is being seen for hospital follow-up accompanied by [facility] interpreter for sign language.  He has recovered well from a stroke standpoint.  Upon CIR discharge, he did not require additional therapies as he made excellent progress.  Denies new stroke/TIA symptoms.  He works at [employer] in [employer].  Compliant on Eliquis, atorvastatin, fenofibrate and Zetia without side effects.  Blood pressure today 118/86. Monitors at home typically stable - but this morning was 88/47 - felt dizzy - ate salt and drank water and went back up. Occasional low BP but not often.  Glucose levels occasionally monitored usually 150s-200s but does not monitor daily. He has not yet received his CPAP machine as they are on back order.  No new concerns at this time.  Stroke admission  09/30/2020 Troy Rush is a 52 y.o. male with stroke risk factors of HTN, HLD, DM2, OSA, CKD stage IV, hx of DVT/PE, AF with RVR, CHF, and nonischemic cardiomyopathy with ICD in place who presented on 09/30/2020 as a Code Stroke for evaluation of Rt weakness and decreased sensation of the Rt hemibody.  Personally reviewed hospitalization pertinent progress notes, lab work and imaging with summary provided.  Evaluated by Dr. Leonie Man for suspected small left subcortical infarct due to small vessel disease.  Patient not a tPA candidate due to full anticoagulation on Eliquis with history of A. fib.  Unable to obtain MRI d/t ICD placement.  CTA head/neck negative LVO with moderate narrowing of distal left cavernous and paraclinoid ICA.  EF 40%, LV mod decreased function, LV global hypokinesis.  A1c 9.7. LDL UTC d/t elevated triglyceride -questionable noncompliance of home meds atorvastatin, fenofibrate and Vascepa.  HTN stable on Coreg, Apresoline and Imdur.  Recommended continuation of Eliquis.  Other stroke risk factors: Obesity, CAD, OSA on CPAP, CHF and A. fib on Eliquis.  Discharged to CIR 6/25-6/30      Pertinent imaging  CT head  CTA head/neck 09/30/2020 IMPRESSION: There is no acute intracranial hemorrhage or evidence of acute infarction. ASPECT score is 10. No large vessel occlusion. Moderate narrowing of the distal left cavernous and paraclinoid ICA presumably secondary to circumferential noncalcified plaque.  CT head 10/01/2020 IMPRESSION: No acute intracranial abnormality.  2D echo IMPRESSIONS   1. Left ventricular ejection fraction, by estimation, is 35  to 40%. The  left ventricle has moderately decreased function. The left ventricle  demonstrates global hypokinesis. Left ventricular diastolic function could  not be evaluated.   2. Right ventricular systolic function is normal. The right ventricular  size is normal. There is normal pulmonary artery systolic pressure.   3. The  mitral valve is normal in structure. No evidence of mitral valve  regurgitation. No evidence of mitral stenosis.   4. The aortic valve is tricuspid. Aortic valve regurgitation is not  visualized. No aortic stenosis is present.   5. The inferior vena cava is dilated in size with <50% respiratory  variability, suggesting right atrial pressure of 15 mmHg.       ROS:   14 system review of systems performed and negative with exception of those listed in HPI  PMH:  Past Medical History:  Diagnosis Date   AICD (automatic cardioverter/defibrillator) present 10/14/2016   Bilateral deafness    Bronchitis    CHF (congestive heart failure) ([place])    Depression    Diabetes mellitus without complication ([place])    DVT, lower extremity ([place]) 07/12/2019   Dyslipidemia    ESRD (end stage renal disease) ([place])    Reports stage IV CKD, Has not started dialysis d/t inability to communicate w/ nephrologist on his first appointment   Headache 08/2019   Hypertension    ICD (implantable cardioverter-defibrillator) in place    Insomnia 08/2019   NICM (nonischemic cardiomyopathy) ([place]) 09/15/2016   Pancreatitis    Renal disorder    Sleep apnea    Vitamin D deficiency 08/2019    PSH:  Past Surgical History:  Procedure Laterality Date   ATRIAL FIBRILLATION ABLATION N/A 03/02/2021   Procedure: ATRIAL FIBRILLATION ABLATION;  Surgeon: Constance Haw, MD;  Location: [place] [facility];  Service: Cardiovascular;  Laterality: N/A;   BACK SURGERY     CARDIAC CATHETERIZATION     ESOPHAGOGASTRODUODENOSCOPY (EGD) WITH PROPOFOL N/A 02/02/2020   Procedure: ESOPHAGOGASTRODUODENOSCOPY (EGD) WITH PROPOFOL;  Surgeon: Arta Silence, MD;  Location: [facility];  Service: Endoscopy;  Laterality: N/A;   ICD IMPLANT  10/14/2016   ICD IMPLANT N/A 10/14/2016   Procedure: ICD Implant;  Surgeon: Deboraha Sprang, MD;  Location: [place] [facility];  Service: Cardiovascular;  Laterality: N/A;   PRESSURE  SENSOR/CARDIOMEMS N/A 09/10/2020   Procedure: PRESSURE SENSOR/CARDIOMEMS;  Surgeon: Larey Dresser, MD;  Location: [place] [place] [facility];  Service: Cardiovascular;  Laterality: N/A;   RIGHT HEART CATH N/A 05/21/2020   Procedure: RIGHT HEART CATH;  Surgeon: Larey Dresser, MD;  Location: [place] [facility];  Service: Cardiovascular;  Laterality: N/A;   WRIST SURGERY      Social History:  Social History   Socioeconomic History   Marital status: Legally Separated    Spouse name: Not on file   Number of children: Not on file   Years of education: Not on file   Highest education level: Not on file  Occupational History   Not on file  Tobacco Use   Smoking status: Never   Smokeless tobacco: Never  Vaping Use   Vaping Use: Never used  Substance and Sexual Activity   Alcohol use: Not Currently    Alcohol/week: 1.0 - 2.0 standard drink    Types: 1 - 2 Glasses of wine per week    Comment: rare   Drug use: No   Sexual activity: Yes    Birth control/protection: None  Other Topics Concern   Not on file  Social History Narrative   Not on file   Social Determinants of Health   Financial Resource Strain: Not on file  Food Insecurity: Not on file  Transportation Needs: Not on file  Physical Activity: Not on file  Stress: Not on file  Social Connections: Not on file  Intimate Partner Violence: Not on file    Family History:  Family History  Problem Relation Age of Onset   Hypertension Mother    Healthy Father    Stroke Father     Medications:   Current Outpatient Medications on File Prior to Visit  Medication Sig Dispense Refill   acetaminophen (TYLENOL) 325 MG tablet Take 2 tablets (650 mg total) by mouth every 4 (four) hours as needed for mild pain (or temp > 37.5 C (99.5 F)).     amiodarone (PACERONE) 200 MG tablet Take 1 tablet (200 mg total) by mouth daily. 60 tablet 6   apixaban (ELIQUIS) 5 MG TABS tablet TAKE 1 TABLET(5 MG) BY MOUTH TWICE DAILY 60 tablet 11    atorvastatin (LIPITOR) 80 MG tablet Take 1 tablet (80 mg total) by mouth at bedtime. 30 tablet 11   carvedilol (COREG) 25 MG tablet TAKE 1 TABLET (25 MG TOTAL) BY MOUTH 2 (TWO) TIMES DAILY. 60 tablet 0   Cholecalciferol (D3-1000 PO) Take 1,000 Units by mouth daily.     Continuous Blood Gluc Receiver (FREESTYLE LIBRE 2 READER) DEVI 1 application by Does not apply route every 14 (fourteen) days. 1 each 0   Continuous Blood Gluc Sensor (FREESTYLE LIBRE 3 SENSOR) MISC 1 application by Does not apply route every 14 (fourteen) days. Place 1 sensor on the skin every 14 days. Use to check glucose continuously 2 each 5   dapagliflozin propanediol (FARXIGA) 10 MG TABS tablet Take 1 tablet (10 mg total) by mouth daily before breakfast. 90 tablet 3   Evolocumab (REPATHA SURECLICK) 914 MG/ML SOAJ Inject 140 mg into the skin every 14 (fourteen) days. 2 mL 11   fenofibrate 54 MG tablet Take 54 mg by mouth daily.     furosemide (LASIX) 80 MG tablet Take 1 tablet (80 mg total) by mouth 2 (two) times daily. 90 tablet 3   hydrALAZINE (APRESOLINE) 50 MG tablet Take 50 mg by mouth 3 (three) times daily.     insulin glargine (LANTUS SOLOSTAR) 100 UNIT/ML Solostar Pen Inject 35 Units into the skin at bedtime. 31.5 mL 3   Insulin Pen Needle (PEN NEEDLES) 31G X 6 MM MISC 1 pen by Does not apply route daily. 100 each 11   isosorbide mononitrate (IMDUR) 60 MG 24 hr tablet Take 1 tablet (60 mg total) by mouth daily. 30 tablet 11   latanoprost (XALATAN) 0.005 % ophthalmic solution Place 1 drop into both eyes at bedtime.     meclizine (ANTIVERT) 25 MG tablet Take 1 tablet (25 mg total) by mouth 3 (three) times daily as needed for dizziness. 90 tablet 3   metolazone (ZAROXOLYN) 2.5 MG tablet Take 1 tablet (2.5 mg total) by mouth as directed. Per Heart Failure Team instructions. 6 tablet 3   pantoprazole (PROTONIX) 40 MG tablet Take 1 tablet (40 mg total) by mouth 2 (two) times daily. 180 tablet 3   sertraline (ZOLOFT) 50 MG  tablet Take 1 tablet (50 mg total) by mouth daily. 90 tablet 1   VASCEPA 1 g capsule Take 2 capsules (2 g total) by mouth 2 (two) times daily. 120 capsule 5   No current facility-administered medications on  file prior to visit.    Allergies:   Allergies  Allergen Reactions   Lisinopril Cough      OBJECTIVE:  Physical Exam  Vitals:   05/27/21 0835  BP: 134/66  Pulse: 68  Weight: 248 lb (112.5 kg)  Height: 5\' 10"  (1.778 m)    Body mass index is 35.58 kg/m. No results found.  General: well developed, well nourished, very pleasant middle-aged African-American male, seated, in no evident distress Head: head normocephalic and atraumatic.   Neck: supple with no carotid or supraclavicular bruits Cardiovascular: regular rate and rhythm, no murmurs Musculoskeletal: no deformity Skin:  no rash/petichiae Vascular:  Normal pulses all extremities   Neurologic Exam Mental Status: Awake and fully alert. Mute and deaf. Oriented to place and time. Recent and remote memory intact. Attention span, concentration and fund of knowledge appropriate. Mood and affect appropriate.  Cranial Nerves: Pupils equal, briskly reactive to light. Extraocular movements full without nystagmus. Visual fields full to confrontation. Facial sensation intact. Face, tongue, palate moves normally and symmetrically.  Motor: Normal bulk and tone. Normal strength in all tested extremity muscles Sensory.: intact to touch , pinprick , position and vibratory sensation.  Coordination: Rapid alternating movements normal in all extremities. Finger-to-nose and heel-to-shin performed accurately bilaterally. Gait and Station: Arises from chair without difficulty. Stance is normal. Gait demonstrates normal stride length and balance without use of assistive device.  Unable to complete tandem walk and heel toe Reflexes: 1+ and symmetric. Toes downgoing.          ASSESSMENT: Troy Rush is a 51 y.o. year old male with  suspected small left subcortical infarct due to small vessel disease on 09/30/2020 after presenting with right-sided weakness and sensory deficit in patient with atrial fibrillation and cardiomyopathy on Eliquis.  Vascular risk factors include HTN, HLD, DM, CAD, OSA, CHF, A. fib on Eliquis.      PLAN:  Ischemic stroke:  Recovered well without residual deficit.   Continue Eliquis (apixaban) daily  and atorvastatin, fenofibrate and Vascepa for secondary stroke prevention.   Discussed secondary stroke prevention measures and importance of close PCP follow up for aggressive stroke risk factor management. I have gone over the pathophysiology of stroke, warning signs and symptoms, risk factors and their management in some detail with instructions to go to the closest emergency room for symptoms of concern. Atrial fibrillation: On Eliquis 5 mg twice daily for CHA2DS2-VASc score of 6.  Routinely followed by cardiology for monitoring and management. S/p ablation 02/2021 HTN: BP goal <130/90.  Stable today on current regimen managed/monitored by cardiology HLD: LDL goal <70. LDL 33 down from 205 on atorvastatin 80mg  daily, fenofibrate and zetia routinely monitored by PCP/cardiology DMII: A1c goal<7.0. Recent A1c 6.0 down from 7.8.  Routinely monitored by PCP OSA on CPAP: Continue nightly use of CPAP monitored by cardiologist Dr. Radford Pax    Overall stable from stroke standpoint without further recommendations.  Recommend follow-up on an as-needed basis.   CC:  PCP: Vevelyn Francois, NP    I spent 34 minutes of face-to-face and non-face-to-face time with patient assisted by interpreter for ALS.  This included previsit chart review, lab review, study review, electronic health record documentation, patient education and discussion regarding prior stroke and etiology, secondary stroke prevention measures and importance of managing stroke risk factors and answered all other questions to patients  satisfaction  Frann Rider, [facility]  [facility] [address] [place], [place] [address]  Phone [phone] Fax [phone]  Note: This document was prepared with digital dictation and possible smart phrase technology. Any transcriptional errors that result from this process are unintentional.

## 2021-05-27 NOTE — Patient Instructions (Signed)
Continue Eliquis (apixaban) daily  and fenofibrate, Vascepa and atorvastatin for secondary stroke prevention - all prescriptions will be managed by PCP/cardiology  Continue to follow with cardiology for atrial fibrillation monitoring and management  Continue to follow up with PCP regarding cholesterol, blood pressure and diabetes management  Maintain strict control of hypertension with blood pressure goal below 130/90, diabetes with hemoglobin A1c goal below 7.0 % and cholesterol with LDL cholesterol (bad cholesterol) goal below 70 mg/dL.   Signs of a Stroke? Follow the BEFAST method:  Balance Watch for a sudden loss of balance, trouble with coordination or vertigo Eyes Is there a sudden loss of vision in one or both eyes? Or double vision?  Face: Ask the person to smile. Does one side of the face droop or is it numb?  Arms: Ask the person to raise both arms. Does one arm drift downward? Is there weakness or numbness of a leg? Speech: Ask the person to repeat a simple phrase. Does the speech sound slurred/strange? Is the person confused ? Time: If you observe any of these signs, call 911.        Thank you for coming to see [place] at [facility]. I hope we have been able to provide you high quality care today.  You may receive a patient satisfaction survey over the next few weeks. We would appreciate your feedback and comments so that we may continue to improve ourselves and the health of our patients.

## 2021-05-27 NOTE — Telephone Encounter (Signed)
Patient called.  Patient aware. Add'l labs 2/21 @ 245-courtesy copy sent to pcp

## 2021-05-27 NOTE — Telephone Encounter (Signed)
-----   Message from Larey Dresser, MD sent at 05/26/2021  3:43 PM EST ----- TSH is up, needs to get free T3, free T4 and repeat TSH drawn.  May need Levoxyl.

## 2021-05-31 ENCOUNTER — Telehealth (HOSPITAL_COMMUNITY): Payer: Self-pay

## 2021-05-31 DIAGNOSIS — Z20822 Contact with and (suspected) exposure to covid-19: Secondary | ICD-10-CM | POA: Diagnosis not present

## 2021-05-31 NOTE — Telephone Encounter (Signed)
[facility]ender to request the following for refills:   Pantoprazole  [name], EMT-Paramedic 05/31/21

## 2021-06-01 ENCOUNTER — Ambulatory Visit (HOSPITAL_COMMUNITY)
Admission: RE | Admit: 2021-06-01 | Discharge: 2021-06-01 | Disposition: A | Discharge status: Home / Self Care | Payer: Medicare Other | Source: Ambulatory Visit | Attending: Internal Medicine | Admitting: Internal Medicine

## 2021-06-01 ENCOUNTER — Other Ambulatory Visit: Payer: Self-pay

## 2021-06-01 DIAGNOSIS — I5042 Chronic combined systolic (congestive) and diastolic (congestive) heart failure: Secondary | ICD-10-CM

## 2021-06-01 LAB — T4, FREE: Free T4: 0.93 ng/dL (ref 0.61–1.12)

## 2021-06-02 ENCOUNTER — Other Ambulatory Visit (HOSPITAL_COMMUNITY): Payer: Self-pay

## 2021-06-02 LAB — T3, FREE: T3, Free: 2.9 pg/mL (ref 2.0–4.4)

## 2021-06-02 NOTE — Progress Notes (Deleted)
Paramedicine Encounter    Patient ID: Troy Rush, male    DOB: [birth year], 51 y.o.   MRN: [id number]  Pt reports he is doing well.  No increased sob, no increased dizziness, no c/p, no edema noted.  He tried getting his freestyle meter but the pharmacy said they are awaiting on insurance approval.  I called them as well and they said it needed a PA.  No missed doses of his meds.  Meds verified and 2 wks of pill boxes refilled.  He just took his meds about 1 hr ago. He got his new CPAP mask today, so he will begin using that again.  He reports compliance with his cardiomems.   Refills needed: Eliquis Farxiga Isosorbide Meclizine-is out after Gwyndolyn Kaufman will request refill and will deliver. [name].    BP (!) 158/98    Pulse 62    Resp 20    Wt 245 lb (111.1 kg)    SpO2 98%    BMI 35.15 kg/m  Weight yesterday-? Last visit weight-250  Patient Care Team: Vevelyn Francois, NP as PCP - General (Adult Health Nurse Practitioner) Larey Dresser, MD as PCP - Cardiology (Cardiology) Conrad [place], NP as Nurse Practitioner (Cardiology) Larey Dresser, MD as Consulting Physician (Cardiology) Jorge Ny, LCSW as Social Worker (Licensed Clinical Social Worker)  Patient Active Problem List   Diagnosis Date Noted   Subcortical infarction [facility]) 10/03/2020   Cerebral thrombosis with cerebral infarction 10/01/2020   Unilateral weakness 09/30/2020   History of atrial fibrillation 09/30/2020   History of DVT (deep vein thrombosis) 09/30/2020   CKD (chronic kidney disease) stage 3, GFR 30-59 ml/min ([place]) 07/08/2020   Acute CHF (congestive heart failure) ([place]) 07/07/2020   CKD (chronic kidney disease), stage IV ([place]) 07/07/2020   ICD (implantable cardioverter-defibrillator) in place 05/14/2020   Pulmonary embolism ([place] 02/05/2020   Acute kidney injury superimposed on CKD ([place])    Chronic combined systolic and diastolic heart failure ([place])    Atrial  fibrillation with RVR ([place]) 02/01/2020   NSVT (nonsustained ventricular tachycardia) 02/01/2020   Acute GI bleeding 02/01/2020   DKA (diabetic ketoacidosis) ([place]) 02/01/2020   Bilateral deafness 08/15/2019   Type 2 diabetes mellitus with complication, with long-term current use of insulin ([place]) 08/15/2019   Hemoglobin A1C between 7% and 9% indicating borderline diabetic control ([place]) 08/15/2019   Insomnia 08/15/2019   Abdominal pain 11/14/2018   Ischemic cardiomyopathy 10/14/2016   NICM (nonischemic cardiomyopathy) ([place]) 26/71/2458   Chronic systolic CHF (congestive heart failure) ([place]) 05/30/2016   HTN (hypertension) 05/30/2016   Deaf 05/30/2016   Hyperkalemia 05/30/2016   Major depressive disorder, recurrent episode ([place]) 03/21/2016   Bronchitis 01/21/2015   Dyslipidemia 01/21/2015   Vitamin D deficiency 12/19/2011   Personal history of other infectious and parasitic diseases 10/25/2011   Anxiety disorder 05/12/2011   Hyperlipidemia 05/12/2011   Hypertriglyceridemia 05/12/2011   Obstructive sleep apnea 05/12/2011    Current Outpatient Medications:    acetaminophen (TYLENOL) 325 MG tablet, Take 2 tablets (650 mg total) by mouth every 4 (four) hours as needed for mild pain (or temp > 37.5 C (99.5 F))., Disp: , Rfl:    amiodarone (PACERONE) 200 MG tablet, Take 1 tablet (200 mg total) by mouth daily., Disp: 60 tablet, Rfl: 6   apixaban (ELIQUIS) 5 MG TABS tablet, TAKE 1 TABLET(5 MG) BY MOUTH TWICE DAILY, Disp: 60 tablet, Rfl: 11   atorvastatin (  LIPITOR) 80 MG tablet, Take 1 tablet (80 mg total) by mouth at bedtime., Disp: 30 tablet, Rfl: 11   carvedilol (COREG) 25 MG tablet, TAKE 1 TABLET (25 MG TOTAL) BY MOUTH 2 (TWO) TIMES DAILY., Disp: 60 tablet, Rfl: 0   Cholecalciferol (D3-1000 PO), Take 1,000 Units by mouth daily., Disp: , Rfl:    dapagliflozin propanediol (FARXIGA) 10 MG TABS tablet, Take 1 tablet (10 mg total) by mouth daily before breakfast., Disp: 90 tablet, Rfl: 3    Evolocumab (REPATHA SURECLICK) 170 MG/ML SOAJ, Inject 140 mg into the skin every 14 (fourteen) days., Disp: 2 mL, Rfl: 11   fenofibrate 54 MG tablet, Take 54 mg by mouth daily., Disp: , Rfl:    furosemide (LASIX) 80 MG tablet, Take 1 tablet (80 mg total) by mouth 2 (two) times daily., Disp: 90 tablet, Rfl: 3   hydrALAZINE (APRESOLINE) 50 MG tablet, Take 50 mg by mouth 3 (three) times daily., Disp: , Rfl:    insulin glargine (LANTUS SOLOSTAR) 100 UNIT/ML Solostar Pen, Inject 35 Units into the skin at bedtime., Disp: 31.5 mL, Rfl: 3   Insulin Pen Needle (PEN NEEDLES) 31G X 6 MM MISC, 1 pen by Does not apply route daily., Disp: 100 each, Rfl: 11   isosorbide mononitrate (IMDUR) 60 MG 24 hr tablet, Take 1 tablet (60 mg total) by mouth daily., Disp: 30 tablet, Rfl: 11   latanoprost (XALATAN) 0.005 % ophthalmic solution, Place 1 drop into both eyes at bedtime., Disp: , Rfl:    meclizine (ANTIVERT) 25 MG tablet, Take 1 tablet (25 mg total) by mouth 3 (three) times daily as needed for dizziness., Disp: 90 tablet, Rfl: 3   Continuous Blood Gluc Receiver (FREESTYLE LIBRE 2 READER) DEVI, 1 application by Does not apply route every 14 (fourteen) days. (Patient not taking: Reported on 06/02/2021), Disp: 1 each, Rfl: 0   Continuous Blood Gluc Sensor (FREESTYLE LIBRE 3 SENSOR) MISC, 1 application by Does not apply route every 14 (fourteen) days. Place 1 sensor on the skin every 14 days. Use to check glucose continuously (Patient not taking: Reported on 06/02/2021), Disp: 2 each, Rfl: 5   metolazone (ZAROXOLYN) 2.5 MG tablet, Take 1 tablet (2.5 mg total) by mouth as directed. Per Heart Failure Team instructions., Disp: 6 tablet, Rfl: 3   pantoprazole (PROTONIX) 40 MG tablet, Take 1 tablet (40 mg total) by mouth 2 (two) times daily., Disp: 180 tablet, Rfl: 3   sertraline (ZOLOFT) 50 MG tablet, Take 1 tablet (50 mg total) by mouth daily., Disp: 90 tablet, Rfl: 1   VASCEPA 1 g capsule, Take 2 capsules (2 g total) by mouth  2 (two) times daily., Disp: 120 capsule, Rfl: 5 Allergies  Allergen Reactions   Lisinopril Cough      Social History   Socioeconomic History   Marital status: Legally Separated    Spouse name: Not on file   Number of children: Not on file   Years of education: Not on file   Highest education level: Not on file  Occupational History   Not on file  Tobacco Use   Smoking status: Never   Smokeless tobacco: Never  Vaping Use   Vaping Use: Never used  Substance and Sexual Activity   Alcohol use: Not Currently    Alcohol/week: 1.0 - 2.0 standard drink    Types: 1 - 2 Glasses of wine per week    Comment: rare   Drug use: No   Sexual activity: Yes  Birth control/protection: None  Other Topics Concern   Not on file  Social History Narrative   Not on file   Social Determinants of Health   Financial Resource Strain: Not on file  Food Insecurity: Not on file  Transportation Needs: Not on file  Physical Activity: Not on file  Stress: Not on file  Social Connections: Not on file  Intimate Partner Violence: Not on file    Physical Exam      Future Appointments  Date Time Provider [place]  06/08/2021  8:30 AM Constance Haw, MD [facility]  06/17/2021  2:20 PM Sueanne Margarita, MD [facility]  06/21/2021  8:35 AM [facility] DEVICE REMOTES [facility]  07/23/2021  9:00 AM [facility] PA/NP [facility] None  07/26/2021 11:00 AM Vevelyn Francois, NP [place] None  09/20/2021  8:35 AM [facility] DEVICE REMOTES [facility]  12/20/2021  8:35 AM [facility] DEVICE REMOTES [facility]       [name], EMT-Paramedic [place] Paramedic  06/02/21

## 2021-06-04 ENCOUNTER — Telehealth (HOSPITAL_COMMUNITY): Payer: Self-pay | Admitting: Family Medicine

## 2021-06-07 NOTE — Progress Notes (Signed)
See other note Opened duplicate.

## 2021-06-07 NOTE — Progress Notes (Signed)
Paramedicine Encounter    Patient ID: Troy Rush, male    DOB: 1970/05/22, 51 y.o.   MRN: [id number]  Pt reports he is doing well.  He tried getting his freestyle meter but the pharmacy said they are awaiting on insurance approval.  I called them as well and they said it needed a PA.  Pt got his new cpap mask that day I was there so he will start using that again. He reports compliance with cardiomems.  No missed doses of his meds, meds verified and 2 wks of pill boxes refilled.  Pt denied any complaints today. No edema noted. No sob, no dizziness. No c/p.    Refills needed: Eliquis Farxiga Isosorbide   BP (!) 158/98    Pulse 62    Resp 20    Wt 245 lb (111.1 kg)    SpO2 98%    BMI 35.15 kg/m  Weight yesterday-? Last visit weight-250 @ clinic   Patient Care Team: Vevelyn Francois, NP as PCP - General (Adult Health Nurse Practitioner) Larey Dresser, MD as PCP - Cardiology (Cardiology) Conrad [place], NP as Nurse Practitioner (Cardiology) Larey Dresser, MD as Consulting Physician (Cardiology) Jorge Ny, LCSW as Social Worker (Licensed Clinical Social Worker)  Patient Active Problem List   Diagnosis Date Noted   Subcortical infarction [facility]) 10/03/2020   Cerebral thrombosis with cerebral infarction 10/01/2020   Unilateral weakness 09/30/2020   History of atrial fibrillation 09/30/2020   History of DVT (deep vein thrombosis) 09/30/2020   CKD (chronic kidney disease) stage 3, GFR 30-59 ml/min ([place]) 07/08/2020   Acute CHF (congestive heart failure) ([place]) 07/07/2020   CKD (chronic kidney disease), stage IV ([place]) 07/07/2020   ICD (implantable cardioverter-defibrillator) in place 05/14/2020   Pulmonary embolism ([place]) 02/05/2020   Acute kidney injury superimposed on CKD ([place])    Chronic combined systolic and diastolic heart failure ([place])    Atrial fibrillation with RVR ([place]) 02/01/2020   NSVT (nonsustained ventricular tachycardia) 02/01/2020   Acute GI bleeding 02/01/2020    DKA (diabetic ketoacidosis) ([place]) 02/01/2020   Bilateral deafness 08/15/2019   Type 2 diabetes mellitus with complication, with long-term current use of insulin ([place]) 08/15/2019   Hemoglobin A1C between 7% and 9% indicating borderline diabetic control ([place]) 08/15/2019   Insomnia 08/15/2019   Abdominal pain 11/14/2018   Ischemic cardiomyopathy 10/14/2016   NICM (nonischemic cardiomyopathy) ([place]) 25/08/3974   Chronic systolic CHF (congestive heart failure) ([place]) 05/30/2016   HTN (hypertension) 05/30/2016   Deaf 05/30/2016   Hyperkalemia 05/30/2016   Major depressive disorder, recurrent episode ([place]) 03/21/2016   Bronchitis 01/21/2015   Dyslipidemia 01/21/2015   Vitamin D deficiency 12/19/2011   Personal history of other infectious and parasitic diseases 10/25/2011   Anxiety disorder 05/12/2011   Hyperlipidemia 05/12/2011   Hypertriglyceridemia 05/12/2011   Obstructive sleep apnea 05/12/2011    Current Outpatient Medications:    acetaminophen (TYLENOL) 325 MG tablet, Take 2 tablets (650 mg total) by mouth every 4 (four) hours as needed for mild pain (or temp > 37.5 C (99.5 F))., Disp: , Rfl:    amiodarone (PACERONE) 200 MG tablet, Take 1 tablet (200 mg total) by mouth daily., Disp: 60 tablet, Rfl: 6   apixaban (ELIQUIS) 5 MG TABS tablet, TAKE 1 TABLET(5 MG) BY MOUTH TWICE DAILY, Disp: 60 tablet, Rfl: 11   atorvastatin (LIPITOR) 80 MG tablet, Take 1 tablet (80 mg total) by mouth at bedtime., Disp: 30 tablet, Rfl: 11   carvedilol (COREG)  25 MG tablet, TAKE 1 TABLET (25 MG TOTAL) BY MOUTH 2 (TWO) TIMES DAILY., Disp: 60 tablet, Rfl: 0   Cholecalciferol (D3-1000 PO), Take 1,000 Units by mouth daily., Disp: , Rfl:    Continuous Blood Gluc Receiver (FREESTYLE LIBRE 2 READER) DEVI, 1 application by Does not apply route every 14 (fourteen) days., Disp: 1 each, Rfl: 0   Continuous Blood Gluc Sensor (FREESTYLE LIBRE 3 SENSOR) MISC, 1 application by Does not apply route every 14 (fourteen) days.  Place 1 sensor on the skin every 14 days. Use to check glucose continuously, Disp: 2 each, Rfl: 5   dapagliflozin propanediol (FARXIGA) 10 MG TABS tablet, Take 1 tablet (10 mg total) by mouth daily before breakfast., Disp: 90 tablet, Rfl: 3   Evolocumab (REPATHA SURECLICK) 962 MG/ML SOAJ, Inject 140 mg into the skin every 14 (fourteen) days., Disp: 2 mL, Rfl: 11   fenofibrate 54 MG tablet, Take 54 mg by mouth daily., Disp: , Rfl:    furosemide (LASIX) 80 MG tablet, Take 1 tablet (80 mg total) by mouth 2 (two) times daily., Disp: 90 tablet, Rfl: 3   hydrALAZINE (APRESOLINE) 50 MG tablet, Take 50 mg by mouth 3 (three) times daily., Disp: , Rfl:    insulin glargine (LANTUS SOLOSTAR) 100 UNIT/ML Solostar Pen, Inject 35 Units into the skin at bedtime., Disp: 31.5 mL, Rfl: 3   Insulin Pen Needle (PEN NEEDLES) 31G X 6 MM MISC, 1 pen by Does not apply route daily., Disp: 100 each, Rfl: 11   isosorbide mononitrate (IMDUR) 60 MG 24 hr tablet, Take 1 tablet (60 mg total) by mouth daily., Disp: 30 tablet, Rfl: 11   latanoprost (XALATAN) 0.005 % ophthalmic solution, Place 1 drop into both eyes at bedtime., Disp: , Rfl:    meclizine (ANTIVERT) 25 MG tablet, Take 1 tablet (25 mg total) by mouth 3 (three) times daily as needed for dizziness., Disp: 90 tablet, Rfl: 3   metolazone (ZAROXOLYN) 2.5 MG tablet, Take 1 tablet (2.5 mg total) by mouth as directed. Per Heart Failure Team instructions., Disp: 6 tablet, Rfl: 3   pantoprazole (PROTONIX) 40 MG tablet, Take 1 tablet (40 mg total) by mouth 2 (two) times daily., Disp: 180 tablet, Rfl: 3   sertraline (ZOLOFT) 50 MG tablet, Take 1 tablet (50 mg total) by mouth daily., Disp: 90 tablet, Rfl: 1   VASCEPA 1 g capsule, Take 2 capsules (2 g total) by mouth 2 (two) times daily., Disp: 120 capsule, Rfl: 5 Allergies  Allergen Reactions   Lisinopril Cough      Social History   Socioeconomic History   Marital status: Legally Separated    Spouse name: Not on file    Number of children: Not on file   Years of education: Not on file   Highest education level: Not on file  Occupational History   Not on file  Tobacco Use   Smoking status: Never   Smokeless tobacco: Never  Vaping Use   Vaping Use: Never used  Substance and Sexual Activity   Alcohol use: Not Currently    Alcohol/week: 1.0 - 2.0 standard drink    Types: 1 - 2 Glasses of wine per week    Comment: rare   Drug use: No   Sexual activity: Yes    Birth control/protection: None  Other Topics Concern   Not on file  Social History Narrative   Not on file   Social Determinants of Health   Financial Resource Strain: Not on file  Food Insecurity: Not on file  Transportation Needs: Not on file  Physical Activity: Not on file  Stress: Not on file  Social Connections: Not on file  Intimate Partner Violence: Not on file    Physical Exam      Future Appointments  Date Time Provider [place]  06/08/2021  8:30 AM Constance Haw, MD [facility]  06/17/2021  2:20 PM Sueanne Margarita, MD [facility]  06/21/2021  8:35 AM [facility] DEVICE REMOTES [facility]  07/26/2021 11:00 AM Vevelyn Francois, NP [facility] None  07/27/2021 11:30 AM [facility] PA/NP [facility] None  09/20/2021  8:35 AM [facility] DEVICE REMOTES [facility]  12/20/2021  8:35 AM [facility] DEVICE REMOTES [facility]       [name], EMT-Paramedic [place] [facility]  06/02/21

## 2021-06-07 NOTE — Progress Notes (Deleted)
Paramedicine Encounter    Patient ID: Troy Rush, male    DOB: 05-08-1970, 51 y.o.   MRN: [id number]  Pt reports he is doing well.  He tried getting his freestyle meter but the pharmacy said they are awaiting on insurance approval.  I called them as well and they said it needed a PA.     Refills needed: Eliquis Farxiga Isosorbide    BP (!) 158/98    Pulse 62    Resp 20    Wt 245 lb (111.1 kg)    SpO2 98%    BMI 35.15 kg/m  Weight yesterday-? Last visit weight-***  Patient Care Team: Vevelyn Francois, NP as PCP - General (Adult Health Nurse Practitioner) Larey Dresser, MD as PCP - Cardiology (Cardiology) Conrad [place], NP as Nurse Practitioner (Cardiology) Larey Dresser, MD as Consulting Physician (Cardiology) Jorge Ny, LCSW as Social Worker (Licensed Clinical Social Worker)  Patient Active Problem List   Diagnosis Date Noted   Subcortical infarction [facility]) 10/03/2020   Cerebral thrombosis with cerebral infarction 10/01/2020   Unilateral weakness 09/30/2020   History of atrial fibrillation 09/30/2020   History of DVT (deep vein thrombosis) 09/30/2020   CKD (chronic kidney disease) stage 3, GFR 30-59 ml/min ([place]) 07/08/2020   Acute CHF (congestive heart failure) ([place]) 07/07/2020   CKD (chronic kidney disease), stage IV ([place]) 07/07/2020   ICD (implantable cardioverter-defibrillator) in place 05/14/2020   Pulmonary embolism ([place]) 02/05/2020   Acute kidney injury superimposed on CKD ([place])    Chronic combined systolic and diastolic heart failure ([place])    Atrial fibrillation with RVR ([place]) 02/01/2020   NSVT (nonsustained ventricular tachycardia) 02/01/2020   Acute GI bleeding 02/01/2020   DKA (diabetic ketoacidosis) ([place]) 02/01/2020   Bilateral deafness 08/15/2019   Type 2 diabetes mellitus with complication, with long-term current use of insulin ([place]) 08/15/2019   Hemoglobin A1C between 7% and 9% indicating borderline diabetic control ([place]) 08/15/2019   Insomnia  08/15/2019   Abdominal pain 11/14/2018   Ischemic cardiomyopathy 10/14/2016   NICM (nonischemic cardiomyopathy) ([place]) 26/83/4196   Chronic systolic CHF (congestive heart failure) ([place]) 05/30/2016   HTN (hypertension) 05/30/2016   Deaf 05/30/2016   Hyperkalemia 05/30/2016   Major depressive disorder, recurrent episode ([place]) 03/21/2016   Bronchitis 01/21/2015   Dyslipidemia 01/21/2015   Vitamin D deficiency 12/19/2011   Personal history of other infectious and parasitic diseases 10/25/2011   Anxiety disorder 05/12/2011   Hyperlipidemia 05/12/2011   Hypertriglyceridemia 05/12/2011   Obstructive sleep apnea 05/12/2011    Current Outpatient Medications:    acetaminophen (TYLENOL) 325 MG tablet, Take 2 tablets (650 mg total) by mouth every 4 (four) hours as needed for mild pain (or temp > 37.5 C (99.5 F))., Disp: , Rfl:    amiodarone (PACERONE) 200 MG tablet, Take 1 tablet (200 mg total) by mouth daily., Disp: 60 tablet, Rfl: 6   apixaban (ELIQUIS) 5 MG TABS tablet, TAKE 1 TABLET(5 MG) BY MOUTH TWICE DAILY, Disp: 60 tablet, Rfl: 11   atorvastatin (LIPITOR) 80 MG tablet, Take 1 tablet (80 mg total) by mouth at bedtime., Disp: 30 tablet, Rfl: 11   carvedilol (COREG) 25 MG tablet, TAKE 1 TABLET (25 MG TOTAL) BY MOUTH 2 (TWO) TIMES DAILY., Disp: 60 tablet, Rfl: 0   Cholecalciferol (D3-1000 PO), Take 1,000 Units by mouth daily., Disp: , Rfl:    dapagliflozin propanediol (FARXIGA) 10 MG TABS tablet, Take 1 tablet (10 mg total) by mouth daily before breakfast., Disp:  90 tablet, Rfl: 3   Evolocumab (REPATHA SURECLICK) 283 MG/ML SOAJ, Inject 140 mg into the skin every 14 (fourteen) days., Disp: 2 mL, Rfl: 11   fenofibrate 54 MG tablet, Take 54 mg by mouth daily., Disp: , Rfl:    furosemide (LASIX) 80 MG tablet, Take 1 tablet (80 mg total) by mouth 2 (two) times daily., Disp: 90 tablet, Rfl: 3   hydrALAZINE (APRESOLINE) 50 MG tablet, Take 50 mg by mouth 3 (three) times daily., Disp: , Rfl:    insulin  glargine (LANTUS SOLOSTAR) 100 UNIT/ML Solostar Pen, Inject 35 Units into the skin at bedtime., Disp: 31.5 mL, Rfl: 3   Insulin Pen Needle (PEN NEEDLES) 31G X 6 MM MISC, 1 pen by Does not apply route daily., Disp: 100 each, Rfl: 11   isosorbide mononitrate (IMDUR) 60 MG 24 hr tablet, Take 1 tablet (60 mg total) by mouth daily., Disp: 30 tablet, Rfl: 11   latanoprost (XALATAN) 0.005 % ophthalmic solution, Place 1 drop into both eyes at bedtime., Disp: , Rfl:    meclizine (ANTIVERT) 25 MG tablet, Take 1 tablet (25 mg total) by mouth 3 (three) times daily as needed for dizziness., Disp: 90 tablet, Rfl: 3   Continuous Blood Gluc Receiver (FREESTYLE LIBRE 2 READER) DEVI, 1 application by Does not apply route every 14 (fourteen) days. (Patient not taking: Reported on 06/02/2021), Disp: 1 each, Rfl: 0   Continuous Blood Gluc Sensor (FREESTYLE LIBRE 3 SENSOR) MISC, 1 application by Does not apply route every 14 (fourteen) days. Place 1 sensor on the skin every 14 days. Use to check glucose continuously (Patient not taking: Reported on 06/02/2021), Disp: 2 each, Rfl: 5   metolazone (ZAROXOLYN) 2.5 MG tablet, Take 1 tablet (2.5 mg total) by mouth as directed. Per Heart Failure Team instructions., Disp: 6 tablet, Rfl: 3   pantoprazole (PROTONIX) 40 MG tablet, Take 1 tablet (40 mg total) by mouth 2 (two) times daily., Disp: 180 tablet, Rfl: 3   sertraline (ZOLOFT) 50 MG tablet, Take 1 tablet (50 mg total) by mouth daily., Disp: 90 tablet, Rfl: 1   VASCEPA 1 g capsule, Take 2 capsules (2 g total) by mouth 2 (two) times daily., Disp: 120 capsule, Rfl: 5 Allergies  Allergen Reactions   Lisinopril Cough      Social History   Socioeconomic History   Marital status: Legally Separated    Spouse name: Not on file   Number of children: Not on file   Years of education: Not on file   Highest education level: Not on file  Occupational History   Not on file  Tobacco Use   Smoking status: Never   Smokeless  tobacco: Never  Vaping Use   Vaping Use: Never used  Substance and Sexual Activity   Alcohol use: Not Currently    Alcohol/week: 1.0 - 2.0 standard drink    Types: 1 - 2 Glasses of wine per week    Comment: rare   Drug use: No   Sexual activity: Yes    Birth control/protection: None  Other Topics Concern   Not on file  Social History Narrative   Not on file   Social Determinants of Health   Financial Resource Strain: Not on file  Food Insecurity: Not on file  Transportation Needs: Not on file  Physical Activity: Not on file  Stress: Not on file  Social Connections: Not on file  Intimate Partner Violence: Not on file    Physical Exam  Future Appointments  Date Time Provider [place]  06/08/2021  8:30 AM Constance Haw, MD [facility]  06/17/2021  2:20 PM Sueanne Margarita, MD [facility]  06/21/2021  8:35 AM [facility] DEVICE REMOTES [facility]  07/26/2021 11:00 AM Vevelyn Francois, NP [facility] None  07/27/2021 11:30 AM [facility] PA/NP [facility] None  09/20/2021  8:35 AM [facility] DEVICE REMOTES [facility]  12/20/2021  8:35 AM [facility] DEVICE REMOTES [facility]       [name], EMT-Paramedic [place] Paramedic  06/07/21

## 2021-06-08 ENCOUNTER — Ambulatory Visit (INDEPENDENT_AMBULATORY_CARE_PROVIDER_SITE_OTHER): Payer: Medicare Other | Admitting: Cardiology

## 2021-06-08 ENCOUNTER — Encounter: Payer: Self-pay | Admitting: *Deleted

## 2021-06-08 ENCOUNTER — Encounter: Payer: Self-pay | Admitting: Cardiology

## 2021-06-08 ENCOUNTER — Other Ambulatory Visit: Payer: Self-pay

## 2021-06-08 VITALS — BP 134/76 | HR 63 | Ht 70.0 in | Wt 243.6 lb

## 2021-06-08 DIAGNOSIS — I4819 Other persistent atrial fibrillation: Secondary | ICD-10-CM

## 2021-06-08 DIAGNOSIS — I639 Cerebral infarction, unspecified: Secondary | ICD-10-CM

## 2021-06-08 DIAGNOSIS — I5022 Chronic systolic (congestive) heart failure: Secondary | ICD-10-CM | POA: Diagnosis not present

## 2021-06-08 DIAGNOSIS — I428 Other cardiomyopathies: Secondary | ICD-10-CM

## 2021-06-08 DIAGNOSIS — D6869 Other thrombophilia: Secondary | ICD-10-CM | POA: Diagnosis not present

## 2021-06-08 NOTE — Progress Notes (Signed)
Electrophysiology Office Note   Date:  06/08/2021   ID:  Troy Rush, DOB October 27, 1970, MRN [id number]  PCP:  Vevelyn Francois, NP  Cardiologist:  Aundra Dubin Primary Electrophysiologist: Meridee Score, MD    Chief Complaint: AF   History of Present Illness: Troy Rush is a 51 y.o. male who is being seen today for the evaluation of AF at the request of Vevelyn Francois, NP. Presenting today for electrophysiology evaluation.  He has a history significant for hypertension, hyperlipidemia, diabetes, chronic systolic heart failure due to nonischemic cardiomyopathy, pulmonary embolism, and atrial fibrillation.  He is status post [place].  On 09/10/2020 he was scheduled for a cardio mems and was noted to be in rapid atrial fibrillation.  He had stable hemodynamics on catheterization.  He was started on amiodarone which converted him to sinus rhythm.  He is now status post atrial fibrillation ablation 03/02/2021.  On 09/30/2020 he was admitted with an acute subcortical CVA.  Given his ICD was unable to get an MRI.  Neurology felt this was due to small vessel disease and Eliquis was continued.  Today, denies symptoms of palpitations, chest pain, shortness of breath, orthopnea, PND, lower extremity edema, claudication, dizziness, presyncope, syncope, bleeding, or neurologic sequela. The patient is tolerating medications without difficulties.  Being seen he has done well.  He is noted no further episodes of atrial fibrillation.  He is overall happy with his control.  He is ready to get off of the amiodarone.  He did have some abdominal itching 2 weeks ago, but aside from that he has had no further issues.   Past Medical History:  Diagnosis Date   AICD (automatic cardioverter/defibrillator) present 10/14/2016   Bilateral deafness    Bronchitis    CHF (congestive heart failure) ([place])    Depression    Diabetes mellitus without complication ([place])    DVT, lower extremity ([place])  07/12/2019   Dyslipidemia    ESRD (end stage renal disease) ([place])    Reports stage IV CKD, Has not started dialysis d/t inability to communicate w/ nephrologist on his first appointment   Headache 08/2019   Hypertension    ICD (implantable cardioverter-defibrillator) in place    Insomnia 08/2019   NICM (nonischemic cardiomyopathy) ([place]) 09/15/2016   Pancreatitis    Renal disorder    Sleep apnea    Vitamin D deficiency 08/2019   Past Surgical History:  Procedure Laterality Date   ATRIAL FIBRILLATION ABLATION N/A 03/02/2021   Procedure: ATRIAL FIBRILLATION ABLATION;  Surgeon: Constance Haw, MD;  Location: [facility];  Service: Cardiovascular;  Laterality: N/A;   BACK SURGERY     CARDIAC CATHETERIZATION     ESOPHAGOGASTRODUODENOSCOPY (EGD) WITH PROPOFOL N/A 02/02/2020   Procedure: ESOPHAGOGASTRODUODENOSCOPY (EGD) WITH PROPOFOL;  Surgeon: Arta Silence, MD;  Location: [facility];  Service: Endoscopy;  Laterality: N/A;   ICD IMPLANT  10/14/2016   ICD IMPLANT N/A 10/14/2016   Procedure: ICD Implant;  Surgeon: Deboraha Sprang, MD;  Location: [facility];  Service: Cardiovascular;  Laterality: N/A;   PRESSURE SENSOR/CARDIOMEMS N/A 09/10/2020   Procedure: PRESSURE SENSOR/CARDIOMEMS;  Surgeon: Larey Dresser, MD;  Location: [facility];  Service: Cardiovascular;  Laterality: N/A;   RIGHT HEART CATH N/A 05/21/2020   Procedure: RIGHT HEART CATH;  Surgeon: Larey Dresser, MD;  Location: [facility];  Service: Cardiovascular;  Laterality: N/A;   WRIST SURGERY  Current Outpatient Medications  Medication Sig Dispense Refill   acetaminophen (TYLENOL) 325 MG tablet Take 2 tablets (650 mg total) by mouth every 4 (four) hours as needed for mild pain (or temp > 37.5 C (99.5 F)).     apixaban (ELIQUIS) 5 MG TABS tablet TAKE 1 TABLET(5 MG) BY MOUTH TWICE DAILY 60 tablet 11   atorvastatin (LIPITOR) 80 MG tablet Take 1 tablet (80 mg total) by mouth at bedtime.  30 tablet 11   carvedilol (COREG) 25 MG tablet TAKE 1 TABLET (25 MG TOTAL) BY MOUTH 2 (TWO) TIMES DAILY. 60 tablet 0   Cholecalciferol (D3-1000 PO) Take 1,000 Units by mouth daily.     Continuous Blood Gluc Receiver (FREESTYLE LIBRE 2 READER) DEVI 1 application by Does not apply route every 14 (fourteen) days. 1 each 0   Continuous Blood Gluc Sensor (FREESTYLE LIBRE 3 SENSOR) MISC 1 application by Does not apply route every 14 (fourteen) days. Place 1 sensor on the skin every 14 days. Use to check glucose continuously 2 each 5   dapagliflozin propanediol (FARXIGA) 10 MG TABS tablet Take 1 tablet (10 mg total) by mouth daily before breakfast. 90 tablet 3   Evolocumab (REPATHA SURECLICK) 175 MG/ML SOAJ Inject 140 mg into the skin every 14 (fourteen) days. 2 mL 11   fenofibrate 54 MG tablet Take 54 mg by mouth daily.     furosemide (LASIX) 80 MG tablet Take 1 tablet (80 mg total) by mouth 2 (two) times daily. 90 tablet 3   hydrALAZINE (APRESOLINE) 50 MG tablet Take 50 mg by mouth 3 (three) times daily.     insulin glargine (LANTUS SOLOSTAR) 100 UNIT/ML Solostar Pen Inject 35 Units into the skin at bedtime. 31.5 mL 3   Insulin Pen Needle (PEN NEEDLES) 31G X 6 MM MISC 1 pen by Does not apply route daily. 100 each 11   isosorbide mononitrate (IMDUR) 60 MG 24 hr tablet Take 1 tablet (60 mg total) by mouth daily. 30 tablet 11   latanoprost (XALATAN) 0.005 % ophthalmic solution Place 1 drop into both eyes at bedtime.     meclizine (ANTIVERT) 25 MG tablet Take 1 tablet (25 mg total) by mouth 3 (three) times daily as needed for dizziness. 90 tablet 3   metolazone (ZAROXOLYN) 2.5 MG tablet Take 1 tablet (2.5 mg total) by mouth as directed. Per Heart Failure Team instructions. 6 tablet 3   pantoprazole (PROTONIX) 40 MG tablet Take 1 tablet (40 mg total) by mouth 2 (two) times daily. 180 tablet 3   sertraline (ZOLOFT) 50 MG tablet Take 1 tablet (50 mg total) by mouth daily. 90 tablet 1   VASCEPA 1 g capsule Take  2 capsules (2 g total) by mouth 2 (two) times daily. 120 capsule 5   No current facility-administered medications for this visit.    Allergies:   Lisinopril   Social History:  The patient  reports that he has never smoked. He has never used smokeless tobacco. He reports that he does not currently use alcohol after a past usage of about 1.0 - 2.0 standard drink per week. He reports that he does not use drugs.   Family History:  The patient's family history includes Healthy in his father; Hypertension in his mother; Stroke in his father.   ROS:  Please see the history of present illness.   Otherwise, review of systems is positive for none.   All other systems are reviewed and negative.   PHYSICAL EXAM: VS:  BP 134/76    Pulse 63    Ht 5\' 10"  (1.778 m)    Wt 243 lb 9.6 oz (110.5 kg)    BMI 34.95 kg/m  , BMI Body mass index is 34.95 kg/m. GEN: Well nourished, well developed, in no acute distress  HEENT: normal  Neck: no JVD, carotid bruits, or masses Cardiac: RRR; no murmurs, rubs, or gallops,no edema  Respiratory:  clear to auscultation bilaterally, normal work of breathing GI: soft, nontender, nondistended, + BS MS: no deformity or atrophy  Skin: warm and dry, device site well healed Neuro:  Strength and sensation are intact Psych: euthymic mood, full affect  EKG:  EKG is ordered today. Personal review of the ekg ordered shows sinus rhythm  Personal review of the device interrogation today. Results in [place]: 03/15/2021: B Natriuretic Peptide 501.6; Magnesium 2.3 05/25/2021: ALT 26; BUN 54; Creatinine, Ser 3.35; Hemoglobin 10.4; Platelets 188; Potassium 4.1; Sodium 140; TSH 8.278    Lipid Panel     Component Value Date/Time   CHOL 120 02/25/2021 0956   TRIG 260 (H) 02/25/2021 0956   HDL 48 02/25/2021 0956   CHOLHDL 2.5 02/25/2021 0956   CHOLHDL 7.7 10/01/2020 0235   VLDL UNABLE TO CALCULATE IF TRIGLYCERIDE OVER 400 mg/dL 10/01/2020 0235   LDLCALC 33  02/25/2021 0956   LDLDIRECT 30 02/25/2021 0956   LDLDIRECT 189.9 (H) 10/01/2020 0235     Wt Readings from Last 3 Encounters:  06/08/21 243 lb 9.6 oz (110.5 kg)  06/02/21 245 lb (111.1 kg)  06/02/21 245 lb (111.1 kg)      Other studies Reviewed: Additional studies/ records that were reviewed today include: TTE 09/30/20  Review of the above records today demonstrates:   1. Left ventricular ejection fraction, by estimation, is 35 to 40%. The  left ventricle has moderately decreased function. The left ventricle  demonstrates global hypokinesis. Left ventricular diastolic function could  not be evaluated.   2. Right ventricular systolic function is normal. The right ventricular  size is normal. There is normal pulmonary artery systolic pressure.   3. The mitral valve is normal in structure. No evidence of mitral valve  regurgitation. No evidence of mitral stenosis.   4. The aortic valve is tricuspid. Aortic valve regurgitation is not  visualized. No aortic stenosis is present.   5. The inferior vena cava is dilated in size with <50% respiratory  variability, suggesting right atrial pressure of 15 mmHg.    ASSESSMENT AND PLAN:  1.  Persistent atrial fibrillation: Currently on Eliquis 5 mg twice daily, amiodarone 200 mg daily.  CHA2DS2-VASc of 3.  High risk medication monitoring for amiodarone.  He is now status post ablation 03/02/2021.  Fortunately has had no further episodes of atrial fibrillation.  Due to that, we Declan Mier stop his amiodarone.  Aside from that no changes at this time.  2.  Chronic systolic heart failure due to nonischemic cardiomyopathy: Currently on optimal medical therapy per primary cardiology.  He is status post [place].  Device functioning appropriately.  No changes at this time.  3.  Hypertension: Currently well controlled  4.  Secondary hypercoagulable state: Currently on Eliquis for atrial fibrillation as above  Current medicines are reviewed at  length with the patient today.   The patient does not have concerns regarding his medicines.  The following changes were made today: Stop amiodarone  Labs/ tests ordered today include:  Orders Placed This Encounter  Procedures   EKG  12-Lead      Disposition:   FU with Keamber Macfadden 3 months  Signed, Cheryln Balcom Meredith Leeds, MD  06/08/2021 8:49 AM     [facility] [address] [place] [address] [phone] (office) [phone] (fax)

## 2021-06-08 NOTE — Patient Instructions (Addendum)
Medication Instructions:  Your physician has recommended you make the following change in your medication:  STOP Amiodarone  *If you need a refill on your cardiac medications before your next appointment, please call your pharmacy*   Lab Work: None ordered   Testing/Procedures: None ordered   Follow-Up: At [facility], you and your health needs are our priority.  As part of our continuing mission to provide you with exceptional heart care, we have created designated Provider Care Teams.  These Care Teams include your primary Cardiologist (physician) and Advanced Practice Providers (APPs -  Physician Assistants and Nurse Practitioners) who all work together to provide you with the care you need, when you need it.  Your next appointment:   3 month(s)  The format for your next appointment:   In Person  Provider:   Allegra Lai, MD    Thank you for choosing [facility]!!   Trinidad Curet, RN [phone]

## 2021-06-10 ENCOUNTER — Other Ambulatory Visit: Payer: Self-pay | Admitting: Nurse Practitioner

## 2021-06-14 ENCOUNTER — Telehealth (HOSPITAL_COMMUNITY): Payer: Self-pay

## 2021-06-14 NOTE — Telephone Encounter (Signed)
[facility] to request the following for refills: ? ?Eliquis ?[place] ?Isosorbide ? ? ?[name], EMT-Paramedic ?06/14/21 ? ?

## 2021-06-16 ENCOUNTER — Other Ambulatory Visit (HOSPITAL_COMMUNITY): Payer: Self-pay

## 2021-06-16 ENCOUNTER — Telehealth: Payer: Self-pay | Admitting: Cardiology

## 2021-06-16 NOTE — Progress Notes (Signed)
Paramedicine Encounter    Patient ID: Troy Rush, male    DOB: 1970-11-12, 51 y.o.   MRN: [id number]  Pt reports feeling ok, he is just tired. He ws told by CPAP that his machine is being turned off due to not using it enough times per the insurance policy. This has been traces to when he got the machine the first month he was showing non-compliance. I contacted [facility] who supplied his CPAP and she advised either he can pay out of pocket for the rest of the machine which is about $509 or he can get another sleep study done-[facility] can hold his machine with humidifier-insurance will not pay for humidifier again- He didn't have a mask for several wks and finally got the mask just 2 wks ago. However when he put mask on and did a seal test it showed to have a high leak detected. So she said it may be his pressures or mask and she recommends doing another sleep study to get it checked again.  According to the sch his appoint with dr turner tomor got cancelled-I called them to get him resch and explain what is going on- His PCP has not refilled his meclizine yet-pt reports he doesn't feel like he needs it.  He denies dizziness. No c/p. His weight is continuing to slowly climb-he does have some trace of edema to his LE.  Still waiting on the freestyle. I called pharmacy and they are waiting for doctor approval.  Will keep check on this-may have to send the office a message.   He was seen recently at office for afib-his amio is being stopped.   Meds verified and 2 wks of pill boxes refilled.  Soon I want to move him to bubble packs.    Refills needed:  Atorvastatin Sertraline Vascepa   BP 122/70    Pulse 74    Resp 18    Wt 247 lb (112 kg)    SpO2 98%    BMI 35.44 kg/m  Weight yesterday-? Last visit weight-245  Patient Care Team: Vevelyn Francois, NP as PCP - General (Adult Health Nurse Practitioner) Larey Dresser, MD as PCP - Cardiology (Cardiology) Conrad Warren, NP as Nurse  Practitioner (Cardiology) Larey Dresser, MD as Consulting Physician (Cardiology) Jorge Ny, LCSW as Social Worker (Licensed Clinical Social Worker)  Patient Active Problem List   Diagnosis Date Noted   Subcortical infarction [facility]) 10/03/2020   Cerebral thrombosis with cerebral infarction 10/01/2020   Unilateral weakness 09/30/2020   History of atrial fibrillation 09/30/2020   History of DVT (deep vein thrombosis) 09/30/2020   CKD (chronic kidney disease) stage 3, GFR 30-59 ml/min ([place]) 07/08/2020   Acute CHF (congestive heart failure) ([place]) 07/07/2020   CKD (chronic kidney disease), stage IV ([place]) 07/07/2020   ICD (implantable cardioverter-defibrillator) in place 05/14/2020   Pulmonary embolism ([place]) 02/05/2020   Acute kidney injury superimposed on CKD ([place])    Chronic combined systolic and diastolic heart failure ([place])    Atrial fibrillation with RVR ([place]) 02/01/2020   NSVT (nonsustained ventricular tachycardia) 02/01/2020   Acute GI bleeding 02/01/2020   DKA (diabetic ketoacidosis) ([place]) 02/01/2020   Bilateral deafness 08/15/2019   Type 2 diabetes mellitus with complication, with long-term current use of insulin ([place]) 08/15/2019   Hemoglobin A1C between 7% and 9% indicating borderline diabetic control ([place]) 08/15/2019   Insomnia 08/15/2019   Abdominal pain 11/14/2018   Ischemic cardiomyopathy 10/14/2016   NICM (nonischemic cardiomyopathy) (  [place]) 73/22/0254   Chronic systolic CHF (congestive heart failure) ([place]) 05/30/2016   HTN (hypertension) 05/30/2016   Deaf 05/30/2016   Hyperkalemia 05/30/2016   Major depressive disorder, recurrent episode ([place]) 03/21/2016   Bronchitis 01/21/2015   Dyslipidemia 01/21/2015   Vitamin D deficiency 12/19/2011   Personal history of other infectious and parasitic diseases 10/25/2011   Anxiety disorder 05/12/2011   Hyperlipidemia 05/12/2011   Hypertriglyceridemia 05/12/2011   Obstructive sleep apnea 05/12/2011    Current Outpatient  Medications:    acetaminophen (TYLENOL) 325 MG tablet, Take 2 tablets (650 mg total) by mouth every 4 (four) hours as needed for mild pain (or temp > 37.5 C (99.5 F))., Disp: , Rfl:    apixaban (ELIQUIS) 5 MG TABS tablet, TAKE 1 TABLET(5 MG) BY MOUTH TWICE DAILY, Disp: 60 tablet, Rfl: 11   atorvastatin (LIPITOR) 80 MG tablet, Take 1 tablet (80 mg total) by mouth at bedtime., Disp: 30 tablet, Rfl: 11   carvedilol (COREG) 25 MG tablet, TAKE 1 TABLET (25 MG TOTAL) BY MOUTH 2 (TWO) TIMES DAILY., Disp: 60 tablet, Rfl: 0   Cholecalciferol (D3-1000 PO), Take 1,000 Units by mouth daily., Disp: , Rfl:    dapagliflozin propanediol (FARXIGA) 10 MG TABS tablet, Take 1 tablet (10 mg total) by mouth daily before breakfast., Disp: 90 tablet, Rfl: 3   Evolocumab (REPATHA SURECLICK) 270 MG/ML SOAJ, Inject 140 mg into the skin every 14 (fourteen) days., Disp: 2 mL, Rfl: 11   fenofibrate 54 MG tablet, Take 54 mg by mouth daily., Disp: , Rfl:    furosemide (LASIX) 80 MG tablet, Take 1 tablet (80 mg total) by mouth 2 (two) times daily., Disp: 90 tablet, Rfl: 3   hydrALAZINE (APRESOLINE) 50 MG tablet, Take 50 mg by mouth 3 (three) times daily., Disp: , Rfl:    insulin glargine (LANTUS SOLOSTAR) 100 UNIT/ML Solostar Pen, Inject 35 Units into the skin at bedtime., Disp: 31.5 mL, Rfl: 3   Insulin Pen Needle (PEN NEEDLES) 31G X 6 MM MISC, 1 pen by Does not apply route daily., Disp: 100 each, Rfl: 11   isosorbide mononitrate (IMDUR) 60 MG 24 hr tablet, Take 1 tablet (60 mg total) by mouth daily., Disp: 30 tablet, Rfl: 11   pantoprazole (PROTONIX) 40 MG tablet, Take 1 tablet (40 mg total) by mouth 2 (two) times daily., Disp: 180 tablet, Rfl: 3   sertraline (ZOLOFT) 50 MG tablet, Take 1 tablet (50 mg total) by mouth daily., Disp: 90 tablet, Rfl: 1   VASCEPA 1 g capsule, Take 2 capsules (2 g total) by mouth 2 (two) times daily., Disp: 120 capsule, Rfl: 5   Continuous Blood Gluc Sensor (FREESTYLE LIBRE 3 SENSOR) MISC, 1  application by Does not apply route every 14 (fourteen) days. Place 1 sensor on the skin every 14 days. Use to check glucose continuously (Patient not taking: Reported on 06/16/2021), Disp: 2 each, Rfl: 5   latanoprost (XALATAN) 0.005 % ophthalmic solution, Place 1 drop into both eyes at bedtime., Disp: , Rfl:    meclizine (ANTIVERT) 25 MG tablet, Take 1 tablet (25 mg total) by mouth 3 (three) times daily as needed for dizziness. (Patient not taking: Reported on 06/16/2021), Disp: 90 tablet, Rfl: 3   metolazone (ZAROXOLYN) 2.5 MG tablet, Take 1 tablet (2.5 mg total) by mouth as directed. Per Heart Failure Team instructions. (Patient not taking: Reported on 06/16/2021), Disp: 6 tablet, Rfl: 3 Allergies  Allergen Reactions   Lisinopril Cough      Social  History   Socioeconomic History   Marital status: Legally Separated    Spouse name: Not on file   Number of children: Not on file   Years of education: Not on file   Highest education level: Not on file  Occupational History   Not on file  Tobacco Use   Smoking status: Never   Smokeless tobacco: Never  Vaping Use   Vaping Use: Never used  Substance and Sexual Activity   Alcohol use: Not Currently    Alcohol/week: 1.0 - 2.0 standard drink    Types: 1 - 2 Glasses of wine per week    Comment: rare   Drug use: No   Sexual activity: Yes    Birth control/protection: None  Other Topics Concern   Not on file  Social History Narrative   Not on file   Social Determinants of Health   Financial Resource Strain: Not on file  Food Insecurity: Not on file  Transportation Needs: Not on file  Physical Activity: Not on file  Stress: Not on file  Social Connections: Not on file  Intimate Partner Violence: Not on file    Physical Exam      Future Appointments  Date Time Provider [place]  06/21/2021  8:35 AM [facility] DEVICE REMOTES [facility]  07/26/2021 11:00 AM Vevelyn Francois, NP [place] None  07/27/2021 11:30  AM [facility] PA/NP [facility] None  09/07/2021  8:30 AM Constance Haw, MD [facility]  09/20/2021  8:35 AM [facility] DEVICE REMOTES [facility]  12/20/2021  8:35 AM [facility] DEVICE REMOTES [facility]       [name], [place] Paramedic  06/16/21

## 2021-06-16 NOTE — Telephone Encounter (Signed)
Katie with [facility] states the patient had been deemed noncompliant with his sleep machine, but had issues with the machine. She says it is on, but he needs a new sleep study and the pressures on the machine may need to be readjusted. She would like the call back to go to her: [phone] ? ? ?

## 2021-06-17 ENCOUNTER — Ambulatory Visit (INDEPENDENT_AMBULATORY_CARE_PROVIDER_SITE_OTHER): Payer: Medicare Other | Admitting: Cardiology

## 2021-06-17 ENCOUNTER — Ambulatory Visit: Payer: Medicare Other | Admitting: Cardiology

## 2021-06-17 ENCOUNTER — Telehealth: Payer: Self-pay | Admitting: *Deleted

## 2021-06-17 ENCOUNTER — Other Ambulatory Visit: Payer: Self-pay

## 2021-06-17 ENCOUNTER — Encounter: Payer: Self-pay | Admitting: Cardiology

## 2021-06-17 VITALS — BP 168/98 | HR 73 | Ht 70.0 in | Wt 247.0 lb

## 2021-06-17 DIAGNOSIS — I10 Essential (primary) hypertension: Secondary | ICD-10-CM

## 2021-06-17 DIAGNOSIS — G4733 Obstructive sleep apnea (adult) (pediatric): Secondary | ICD-10-CM

## 2021-06-17 DIAGNOSIS — N184 Chronic kidney disease, stage 4 (severe): Secondary | ICD-10-CM | POA: Diagnosis not present

## 2021-06-17 DIAGNOSIS — I639 Cerebral infarction, unspecified: Secondary | ICD-10-CM

## 2021-06-17 NOTE — Addendum Note (Signed)
Addended by: Loren Racer on: 06/17/2021 09:29 AM ? ? Modules accepted: Orders ? ?

## 2021-06-17 NOTE — Patient Instructions (Signed)
Medication Instructions:  ?Your physician recommends that you continue on your current medications as directed. Please refer to the Current Medication list given to you today. ? ?*If you need a refill on your cardiac medications before your next appointment, please call your pharmacy* ? ? ?Lab Work: ?None ?If you have labs (blood work) drawn today and your tests are completely normal, you will receive your results only by: ?MyChart Message (if you have MyChart) OR ?A paper copy in the mail ?If you have any lab test that is abnormal or we need to change your treatment, we will call you to review the results. ? ? ?Testing/Procedures: ?Your physician has recommended that you have a sleep study. This test records several body functions during sleep, including: brain activity, eye movement, oxygen and carbon dioxide blood levels, heart rate and rhythm, breathing rate and rhythm, the flow of air through your mouth and nose, snoring, body muscle movements, and chest and belly movement. ? ? ?Follow-Up: ?We will contact you after your sleep study to set up an appointment with Dr. Radford Pax.}  ? ? ? ?

## 2021-06-17 NOTE — Telephone Encounter (Signed)
Called patient and left a message with the answering service offering an appointment for Friday night at 8 pm. ?

## 2021-06-17 NOTE — Telephone Encounter (Signed)
-----   Message from Loren Racer, RN sent at 06/17/2021  9:29 AM EST ----- ?Dr. Radford Pax wants pt to have split night sleep study within the next week. ? ? ?

## 2021-06-17 NOTE — Progress Notes (Signed)
Cardiology Office Note:    Date:  06/17/2021   ID:  Troy Rush, DOB 1971/03/05, MRN [id number]  PCP:  Vevelyn Francois, NP  Cardiologist:  Loralie Champagne, MD    Referring MD: Vevelyn Francois, NP   Chief Complaint  Patient presents with   Sleep Apnea   Hypertension    History of Present Illness:    Troy Rush is a 51 y.o. male with a hx of CHF, end-stage renal disease, hyperlipidemia, diabetes, hypertension, nonischemic cardiomyopathy who was referred for sleep study a year ago.  He underwent NPSG showing severe obstructive sleep apnea with an AHI of 125.7/h and moderate central sleep apnea with a CAI of 27.5/h and nocturnal hypoxemia with lowest O2 saturation 51%.  He underwent CPAP titration to 17 cm H2O.  He is now here for follow-up and OV performed through a deaf interpreter.  He is not doing well with his CPAP device.  He tells me that when he first got the device in Nov he started using it with no problems. But in January he started noticing that the mask was leaking and kept trying to use it but couldn't due to the leak.  He saw Dr. Aundra Dubin and contacted the DME and was told that he needs to change out the cushion monthly.  There was a delay and he got the new cushion Feb 22 but had the same issue with the mask leaking.  He changed out the tubing as well.  A nurse came to the home and evaluated it and called the company to tell them the device was not working right.  She said that the mask was still leaking and the machine kept turning off. The nurse that came out yesterday contacted [facility] who supplied his CPAP and she advised either he can pay out of pocket for the rest of the machine which is about $509 or he can get another sleep study done-[facility] can hold his machine with humidifier-insurance will not pay for humidifier again.  Past Medical History:  Diagnosis Date   AICD (automatic cardioverter/defibrillator) present 10/14/2016   Bilateral deafness    Bronchitis     CHF (congestive heart failure) ([place])    Depression    Diabetes mellitus without complication ([place])    DVT, lower extremity ([place]) 07/12/2019   Dyslipidemia    ESRD (end stage renal disease) ([place])    Reports stage IV CKD, Has not started dialysis d/t inability to communicate w/ nephrologist on his first appointment   Headache 08/2019   Hypertension    ICD (implantable cardioverter-defibrillator) in place    Insomnia 08/2019   NICM (nonischemic cardiomyopathy) ([place]) 09/15/2016   Pancreatitis    Renal disorder    Sleep apnea    Vitamin D deficiency 08/2019    Past Surgical History:  Procedure Laterality Date   ATRIAL FIBRILLATION ABLATION N/A 03/02/2021   Procedure: ATRIAL FIBRILLATION ABLATION;  Surgeon: Constance Haw, MD;  Location: [facility];  Service: Cardiovascular;  Laterality: N/A;   BACK SURGERY     CARDIAC CATHETERIZATION     ESOPHAGOGASTRODUODENOSCOPY (EGD) WITH PROPOFOL N/A 02/02/2020   Procedure: ESOPHAGOGASTRODUODENOSCOPY (EGD) WITH PROPOFOL;  Surgeon: Arta Silence, MD;  Location: [facility];  Service: Endoscopy;  Laterality: N/A;   ICD IMPLANT  10/14/2016   ICD IMPLANT N/A 10/14/2016   Procedure: ICD Implant;  Surgeon: Deboraha Sprang, MD;  Location: [facility];  Service: Cardiovascular;  Laterality: N/A;   PRESSURE  SENSOR/CARDIOMEMS N/A 09/10/2020   Procedure: PRESSURE SENSOR/CARDIOMEMS;  Surgeon: Larey Dresser, MD;  Location: [facility];  Service: Cardiovascular;  Laterality: N/A;   RIGHT HEART CATH N/A 05/21/2020   Procedure: RIGHT HEART CATH;  Surgeon: Larey Dresser, MD;  Location: [facility];  Service: Cardiovascular;  Laterality: N/A;   WRIST SURGERY      Current Medications: Current Meds  Medication Sig   acetaminophen (TYLENOL) 325 MG tablet Take 2 tablets (650 mg total) by mouth every 4 (four) hours as needed for mild pain (or temp > 37.5 C (99.5 F)).   apixaban (ELIQUIS) 5 MG TABS tablet TAKE 1 TABLET(5 MG)  BY MOUTH TWICE DAILY   atorvastatin (LIPITOR) 80 MG tablet Take 1 tablet (80 mg total) by mouth at bedtime.   carvedilol (COREG) 25 MG tablet TAKE 1 TABLET (25 MG TOTAL) BY MOUTH 2 (TWO) TIMES DAILY.   Cholecalciferol (D3-1000 PO) Take 1,000 Units by mouth daily.   Continuous Blood Gluc Sensor (FREESTYLE LIBRE 3 SENSOR) MISC 1 application by Does not apply route every 14 (fourteen) days. Place 1 sensor on the skin every 14 days. Use to check glucose continuously   dapagliflozin propanediol (FARXIGA) 10 MG TABS tablet Take 1 tablet (10 mg total) by mouth daily before breakfast.   Evolocumab (REPATHA SURECLICK) 976 MG/ML SOAJ Inject 140 mg into the skin every 14 (fourteen) days.   fenofibrate 54 MG tablet Take 54 mg by mouth daily.   furosemide (LASIX) 80 MG tablet Take 1 tablet (80 mg total) by mouth 2 (two) times daily.   hydrALAZINE (APRESOLINE) 50 MG tablet Take 50 mg by mouth 3 (three) times daily.   insulin glargine (LANTUS SOLOSTAR) 100 UNIT/ML Solostar Pen Inject 35 Units into the skin at bedtime.   Insulin Pen Needle (PEN NEEDLES) 31G X 6 MM MISC 1 pen by Does not apply route daily.   isosorbide mononitrate (IMDUR) 60 MG 24 hr tablet Take 1 tablet (60 mg total) by mouth daily.   latanoprost (XALATAN) 0.005 % ophthalmic solution Place 1 drop into both eyes at bedtime.   meclizine (ANTIVERT) 25 MG tablet Take 1 tablet (25 mg total) by mouth 3 (three) times daily as needed for dizziness.   metolazone (ZAROXOLYN) 2.5 MG tablet Take 1 tablet (2.5 mg total) by mouth as directed. Per Heart Failure Team instructions.   pantoprazole (PROTONIX) 40 MG tablet Take 1 tablet (40 mg total) by mouth 2 (two) times daily.   sertraline (ZOLOFT) 50 MG tablet Take 1 tablet (50 mg total) by mouth daily.   VASCEPA 1 g capsule Take 2 capsules (2 g total) by mouth 2 (two) times daily.     Allergies:   Lisinopril   Social History   Socioeconomic History   Marital status: Legally Separated    Spouse name: Not  on file   Number of children: Not on file   Years of education: Not on file   Highest education level: Not on file  Occupational History   Not on file  Tobacco Use   Smoking status: Never   Smokeless tobacco: Never  Vaping Use   Vaping Use: Never used  Substance and Sexual Activity   Alcohol use: Not Currently    Alcohol/week: 1.0 - 2.0 standard drink    Types: 1 - 2 Glasses of wine per week    Comment: rare   Drug use: No   Sexual activity: Yes    Birth control/protection: None  Other  Topics Concern   Not on file  Social History Narrative   Not on file   Social Determinants of Health   Financial Resource Strain: Not on file  Food Insecurity: Not on file  Transportation Needs: Not on file  Physical Activity: Not on file  Stress: Not on file  Social Connections: Not on file     Family History: The patient's family history includes Healthy in his father; Hypertension in his mother; Stroke in his father.  ROS:   Please see the history of present illness.    ROS  All other systems reviewed and negative.   EKGs/Labs/Other Studies Reviewed:    The following studies were reviewed today: PSG, CPAP titration and PAP compliance download  EKG:  EKG is not ordered today.    Recent Labs: 03/15/2021: B Natriuretic Peptide 501.6; Magnesium 2.3 05/25/2021: ALT 26; BUN 54; Creatinine, Ser 3.35; Hemoglobin 10.4; Platelets 188; Potassium 4.1; Sodium 140; TSH 8.278   Recent Lipid Panel    Component Value Date/Time   CHOL 120 02/25/2021 0956   TRIG 260 (H) 02/25/2021 0956   HDL 48 02/25/2021 0956   CHOLHDL 2.5 02/25/2021 0956   CHOLHDL 7.7 10/01/2020 0235   VLDL UNABLE TO CALCULATE IF TRIGLYCERIDE OVER 400 mg/dL 10/01/2020 0235   LDLCALC 33 02/25/2021 0956   LDLDIRECT 30 02/25/2021 0956   LDLDIRECT 189.9 (H) 10/01/2020 0235    Physical Exam:    VS:  BP (!) 168/98    Pulse 73    Ht 5\' 10"  (1.778 m)    Wt 247 lb (112 kg)    SpO2 99%    BMI 35.44 kg/m     Wt Readings  from Last 3 Encounters:  06/17/21 247 lb (112 kg)  06/16/21 247 lb (112 kg)  06/08/21 243 lb 9.6 oz (110.5 kg)     GEN:  Well nourished, well developed in no acute distress HEENT: Normal NECK: No JVD; No carotid bruits LYMPHATICS: No lymphadenopathy CARDIAC: RRR, no murmurs, rubs, gallops RESPIRATORY:  Clear to auscultation without rales, wheezing or rhonchi  ABDOMEN: Soft, non-tender, non-distended MUSCULOSKELETAL:  No edema; No deformity  SKIN: Warm and dry NEUROLOGIC:  Alert and oriented x 3 PSYCHIATRIC:  Normal affect   ASSESSMENT:    1. OSA (obstructive sleep apnea)   2. Primary hypertension    PLAN:    In order of problems listed above:  OSA - he has really been struggling with his device with the mask leaking and fell out of compliance and insurance is requiring he pay $509 out of pocket or get another sleep study so that insurance will cover the device. -I will order a split night sleep study to get back on CPAP   HTN -BP is borderline controlled on exam today -Continue prescription drug management with carvedilol 25 mg twice daily, hydralazine 50 mg 3 times daily, Imdur 60 mg daily with as needed refills   Medication Adjustments/Labs and Tests Ordered: Current medicines are reviewed at length with the patient today.  Concerns regarding medicines are outlined above.  No orders of the defined types were placed in this encounter.  No orders of the defined types were placed in this encounter.   Signed, Fransico Him, MD  06/17/2021 9:14 AM    [place]

## 2021-06-21 ENCOUNTER — Ambulatory Visit (INDEPENDENT_AMBULATORY_CARE_PROVIDER_SITE_OTHER): Payer: Medicare Other

## 2021-06-21 DIAGNOSIS — I428 Other cardiomyopathies: Secondary | ICD-10-CM

## 2021-06-22 DIAGNOSIS — I129 Hypertensive chronic kidney disease with stage 1 through stage 4 chronic kidney disease, or unspecified chronic kidney disease: Secondary | ICD-10-CM | POA: Diagnosis not present

## 2021-06-22 DIAGNOSIS — N1832 Chronic kidney disease, stage 3b: Secondary | ICD-10-CM | POA: Diagnosis not present

## 2021-06-22 DIAGNOSIS — E1122 Type 2 diabetes mellitus with diabetic chronic kidney disease: Secondary | ICD-10-CM | POA: Diagnosis not present

## 2021-06-22 DIAGNOSIS — H9193 Unspecified hearing loss, bilateral: Secondary | ICD-10-CM | POA: Diagnosis not present

## 2021-06-22 DIAGNOSIS — R809 Proteinuria, unspecified: Secondary | ICD-10-CM | POA: Diagnosis not present

## 2021-06-22 DIAGNOSIS — N2581 Secondary hyperparathyroidism of renal origin: Secondary | ICD-10-CM | POA: Diagnosis not present

## 2021-06-22 DIAGNOSIS — I5042 Chronic combined systolic (congestive) and diastolic (congestive) heart failure: Secondary | ICD-10-CM | POA: Diagnosis not present

## 2021-06-22 LAB — CUP PACEART REMOTE DEVICE CHECK
Battery Remaining Longevity: 138 mo
Battery Remaining Percentage: 95 %
Brady Statistic RV Percent Paced: 0 %
Date Time Interrogation Session: 20230313004100
HighPow Impedance: 67 Ohm
Implantable Lead Implant Date: 20180706
Implantable Lead Location: 753860
Implantable Lead Model: 293
Implantable Lead Serial Number: 432676
Implantable Pulse Generator Implant Date: 20180706
Lead Channel Impedance Value: 418 Ohm
Lead Channel Setting Pacing Amplitude: 2.5 V
Lead Channel Setting Pacing Pulse Width: 0.4 ms
Lead Channel Setting Sensing Sensitivity: 0.5 mV
Pulse Gen Serial Number: 236570

## 2021-06-22 NOTE — Telephone Encounter (Signed)
Patient is scheduled for lab study on 06-24-21. ?Patient understands her sleep study will be done at [facility] sleep lab. ?Patient understands she will receive a sleep packet in a week or so. ?Patient understands to call if she does not receive the sleep packet in a timely manner. ?Patient agrees with treatment and thanked me for call ?Left detailed message on voicemail with date and time of titration and informed patient to call back to confirm or reschedule.  ?

## 2021-06-24 ENCOUNTER — Ambulatory Visit (HOSPITAL_BASED_OUTPATIENT_CLINIC_OR_DEPARTMENT_OTHER): Payer: Medicare Other | Attending: Cardiology | Admitting: Cardiology

## 2021-06-24 ENCOUNTER — Other Ambulatory Visit: Payer: Self-pay

## 2021-06-24 DIAGNOSIS — G4733 Obstructive sleep apnea (adult) (pediatric): Secondary | ICD-10-CM | POA: Diagnosis not present

## 2021-06-24 DIAGNOSIS — G4734 Idiopathic sleep related nonobstructive alveolar hypoventilation: Secondary | ICD-10-CM | POA: Diagnosis not present

## 2021-06-24 DIAGNOSIS — G4736 Sleep related hypoventilation in conditions classified elsewhere: Secondary | ICD-10-CM | POA: Diagnosis not present

## 2021-06-29 NOTE — Procedures (Signed)
? ?Patient Name: Troy Rush, Troy Rush ?Study Date: 06/24/2021 ?Gender: Male ?D.O.B: 17-Feb-1971 ?Age (years): 55 ?Referring Provider: Allena Katz FNP ?Height (inches): 70 ?Interpreting Physician: Fransico Him MD, ABSM ?Weight (lbs): 240 ?RPSGT: Jorge Ny ?BMI: 34 ?MRN: [id number] ?Neck Size: 18.00 ? ?CLINICAL INFORMATION ?The patient is referred for a split night study with BPAP. ?Most recent polysomnogram dated 05/30/2020 revealed an AHI of 125/h and RDI of 126/h. Most recent titration study dated 08/12/2020 was optimal at 20cm H2O with an AHI of 9/h. ? ?MEDICATIONS ?Medications self-administered by patient taken the night of the study : N/A ? ?SLEEP STUDY TECHNIQUE ?As per the AASM Manual for the Scoring of Sleep and Associated Events v2.3 (April 2016) with a hypopnea requiring 4% desaturations. ? ?The channels recorded and monitored were frontal, central and occipital EEG, electrooculogram (EOG), submentalis EMG (chin), nasal and oral airflow, thoracic and abdominal wall motion, anterior tibialis EMG, snore microphone, electrocardiogram, and pulse oximetry. Bi-level positive airway pressure (BiPAP) was initiated when the patient met split night criteria and was titrated according to treat sleep-disordered breathing. ? ?RESPIRATORY PARAMETERS ?Diagnostic ?Total AHI (/hr): 140.7  ?RDI (/hr):140.7  ?OA Index (/hr): 10.2  ?CA Index (/hr): 0.0 ?REM AHI (/hr): N/A  ?NREM AHI (/hr):140.7  ?Supine AHI (/hr):141.8  ?Non-supine AHI (/hr):139.46 ?Min O2 Sat (%):61.0 ?Mean O2 (%): 81.8  ?Time below 88% (min):90.7  ? ?Titration ?Optimal IPAP Pressure (cm): N/A ?Optimal EPAP Pressure (cm):N/A ?AHI at Optimal Pressure (/hr):N/A  ?Min O2 at Optimal Pressure (%):66.0 ?Sleep % at Optimal (%):N/A  ?Supine % at Optimal (%):N/A  ? ?SLEEP ARCHITECTURE ?The study was initiated at 10:08:59 PM and terminated at 5:11:06 AM. The total recorded time was 422.1 minutes. EEG confirmed total sleep time was 413.7 minutes yielding a sleep  efficiency of 98.0%. Sleep onset after lights out was 1.0 minutes with a REM latency of 210.0 minutes. The patient spent 2.8% of the night in stage N1 sleep, 71.1% in stage N2 sleep, 0.0% in stage N3 and 26.2% in REM. Wake after sleep onset (WASO) was 7.5 minutes. The Arousal Index was 49.9/hour. ? ?LEG MOVEMENT DATA ?The total Periodic Limb Movements of Sleep (PLMS) were 0. The PLMS index was 0.0 . ? ?CARDIAC DATA ?The 2 lead EKG demonstrated sinus rhythm. The mean heart rate was 100.0 beats per minute. Other EKG findings include: None. ? ?IMPRESSIONS ?- Severe obstructive sleep apnea occurred during the diagnostic portion of the study (AHI = 140.7 /hour). An optimal BPAP pressure could not be selected for this patient based on the available study data. ?- No significant central sleep apnea occurred during the diagnostic portion of the study (CAI = 0.0/hour). ?- Severe oxygen desaturation was noted during the diagnostic portion of the study (Min O2 = 61.0%). ?- The patient snored with loud snoring volume during the diagnostic portion of the study. ?- No cardiac abnormalities were noted during this study. ?- Clinically significant periodic limb movements of sleep did not occur during the study. ? ?DIAGNOSIS ?- Obstructive Sleep Apnea (G47.33) ?- Nocturnal Hypoxemia (G47.36) ? ?RECOMMENDATIONS ?- Recommend full night BiPAP titration due to inadequate time for Bipap titration and severity of OSA. ?- Avoid alcohol, sedatives and other CNS depressants that may worsen sleep apnea and disrupt normal sleep architecture. ?- Sleep hygiene should be reviewed to assess factors that may improve sleep quality. ?- Weight management and regular exercise should be initiated or continued. ? ?[Electronically signed] 06/29/2021 11:34 AM ? ?Fransico Him MD, ABSM ?Diplomate, Tax adviser of Sleep Medicine ? ? ? ? ?

## 2021-06-30 NOTE — Telephone Encounter (Signed)
Returned [employer] and discussed the patient issues and found a solution. ?

## 2021-07-01 ENCOUNTER — Other Ambulatory Visit (HOSPITAL_COMMUNITY): Payer: Self-pay

## 2021-07-01 NOTE — Progress Notes (Signed)
Paramedicine Encounter ? ? ? Patient ID: Troy Rush, male    DOB: [birth year], 51 y.o.   MRN: [id number] ? ?Pt reports doing well.  ?He denies sob, no dizziness, no c/p. Hes got a tiny bit of fluid in his legs, his weight is creeping up, he is eating a lot.  ?He reports kidney doc told him his kidneys are improving.   ?He redone the sleep study. Looks like it was a split night study, he is being recommended for full night study.  ?They will call him to set that up.  ? ?Will take his meds to [facility] to get him started on bubble packs for next week.  ?He is short on vascepa a couple of evening doses.  ? ? ?BP (!) 162/90   Pulse 71   Resp 18   Wt 249 lb (112.9 kg)   SpO2 98%   BMI 35.73 kg/m?  ?Weight yesterday-? ?Last visit weight-247 ? ?Patient Care Team: ?Vevelyn Francois, NP as PCP - General (Adult Health Nurse Practitioner) ?Larey Dresser, MD as PCP - Cardiology (Cardiology) ?Conrad [place], NP as Nurse Practitioner (Cardiology) ?Larey Dresser, MD as Consulting Physician (Cardiology) ?Jorge Ny, LCSW as Education officer, museum (Licensed [employer] representative) ? ?Patient Active Problem List  ? Diagnosis Date Noted  ? Subcortical infarction ([place]) 10/03/2020  ? Cerebral thrombosis with cerebral infarction 10/01/2020  ? Unilateral weakness 09/30/2020  ? History of atrial fibrillation 09/30/2020  ? History of DVT (deep vein thrombosis) 09/30/2020  ? CKD (chronic kidney disease) stage 3, GFR 30-59 ml/min (HCC) 07/08/2020  ? Acute CHF (congestive heart failure) ([place]) 07/07/2020  ? CKD (chronic kidney disease), stage IV ([place]) 07/07/2020  ? ICD (implantable cardioverter-defibrillator) in place 05/14/2020  ? Pulmonary embolism ([place]) 02/05/2020  ? Acute kidney injury superimposed on CKD ([place])   ? Chronic combined systolic and diastolic heart failure ([place])   ? Atrial fibrillation with RVR ([place]) 02/01/2020  ? NSVT (nonsustained ventricular tachycardia) 02/01/2020  ? Acute GI bleeding 02/01/2020  ? DKA (diabetic  ketoacidosis) ([place]) 02/01/2020  ? Bilateral deafness 08/15/2019  ? Type 2 diabetes mellitus with complication, with long-term current use of insulin ([place]) 08/15/2019  ? Hemoglobin A1C between 7% and 9% indicating borderline diabetic control ([place]) 08/15/2019  ? Insomnia 08/15/2019  ? Abdominal pain 11/14/2018  ? Ischemic cardiomyopathy 10/14/2016  ? NICM (nonischemic cardiomyopathy) ([place]) 09/15/2016  ? Chronic systolic CHF (congestive heart failure) ([place]) 05/30/2016  ? HTN (hypertension) 05/30/2016  ? Deaf 05/30/2016  ? Hyperkalemia 05/30/2016  ? Major depressive disorder, recurrent episode ([place]) 03/21/2016  ? Bronchitis 01/21/2015  ? Dyslipidemia 01/21/2015  ? Vitamin D deficiency 12/19/2011  ? Personal history of other infectious and parasitic diseases 10/25/2011  ? Anxiety disorder 05/12/2011  ? Hyperlipidemia 05/12/2011  ? Hypertriglyceridemia 05/12/2011  ? Obstructive sleep apnea 05/12/2011  ? ? ?Current Outpatient Medications:  ?  acetaminophen (TYLENOL) 325 MG tablet, Take 2 tablets (650 mg total) by mouth every 4 (four) hours as needed for mild pain (or temp > 37.5 C (99.5 F))., Disp: , Rfl:  ?  apixaban (ELIQUIS) 5 MG TABS tablet, TAKE 1 TABLET(5 MG) BY MOUTH TWICE DAILY, Disp: 60 tablet, Rfl: 11 ?  atorvastatin (LIPITOR) 80 MG tablet, Take 1 tablet (80 mg total) by mouth at bedtime., Disp: 30 tablet, Rfl: 11 ?  carvedilol (COREG) 25 MG tablet, TAKE 1 TABLET (25 MG TOTAL) BY MOUTH 2 (TWO) TIMES DAILY., Disp: 60 tablet, Rfl: 0 ?  Cholecalciferol (D3-1000 PO), Take 1,000 Units by mouth daily., Disp: , Rfl:  ?  dapagliflozin propanediol (FARXIGA) 10 MG TABS tablet, Take 1 tablet (10 mg total) by mouth daily before breakfast., Disp: 90 tablet, Rfl: 3 ?  Evolocumab (REPATHA SURECLICK) 664 MG/ML SOAJ, Inject 140 mg into the skin every 14 (fourteen) days., Disp: 2 mL, Rfl: 11 ?  fenofibrate 54 MG tablet, Take 54 mg by mouth daily., Disp: , Rfl:  ?  furosemide (LASIX) 80 MG tablet, Take 1 tablet (80 mg total) by  mouth 2 (two) times daily., Disp: 90 tablet, Rfl: 3 ?  hydrALAZINE (APRESOLINE) 50 MG tablet, Take 50 mg by mouth 3 (three) times daily., Disp: , Rfl:  ?  insulin glargine (LANTUS SOLOSTAR) 100 UNIT/ML Solostar Pen, Inject 35 Units into the skin at bedtime., Disp: 31.5 mL, Rfl: 3 ?  Insulin Pen Needle (PEN NEEDLES) 31G X 6 MM MISC, 1 pen by Does not apply route daily., Disp: 100 each, Rfl: 11 ?  isosorbide mononitrate (IMDUR) 60 MG 24 hr tablet, Take 1 tablet (60 mg total) by mouth daily., Disp: 30 tablet, Rfl: 11 ?  latanoprost (XALATAN) 0.005 % ophthalmic solution, Place 1 drop into both eyes at bedtime., Disp: , Rfl:  ?  pantoprazole (PROTONIX) 40 MG tablet, Take 1 tablet (40 mg total) by mouth 2 (two) times daily., Disp: 180 tablet, Rfl: 3 ?  sertraline (ZOLOFT) 50 MG tablet, Take 1 tablet (50 mg total) by mouth daily., Disp: 90 tablet, Rfl: 1 ?  VASCEPA 1 g capsule, Take 2 capsules (2 g total) by mouth 2 (two) times daily., Disp: 120 capsule, Rfl: 5 ?  Continuous Blood Gluc Sensor (FREESTYLE LIBRE 3 SENSOR) MISC, 1 application by Does not apply route every 14 (fourteen) days. Place 1 sensor on the skin every 14 days. Use to check glucose continuously (Patient not taking: Reported on 07/01/2021), Disp: 2 each, Rfl: 5 ?  meclizine (ANTIVERT) 25 MG tablet, Take 1 tablet (25 mg total) by mouth 3 (three) times daily as needed for dizziness. (Patient not taking: Reported on 07/01/2021), Disp: 90 tablet, Rfl: 3 ?  metolazone (ZAROXOLYN) 2.5 MG tablet, Take 1 tablet (2.5 mg total) by mouth as directed. Per Heart Failure Team instructions. (Patient not taking: Reported on 07/01/2021), Disp: 6 tablet, Rfl: 3 ?Allergies  ?Allergen Reactions  ? Lisinopril Cough  ? ? ? ? ?Social History  ? ?Socioeconomic History  ? Marital status: Legally Separated  ?  Spouse name: Not on file  ? Number of children: Not on file  ? Years of education: Not on file  ? Highest education level: Not on file  ?Occupational History  ? Not on file   ?Tobacco Use  ? Smoking status: Never  ? Smokeless tobacco: Never  ?Vaping Use  ? Vaping Use: Never used  ?Substance and Sexual Activity  ? Alcohol use: Not Currently  ?  Alcohol/week: 1.0 - 2.0 standard drink  ?  Types: 1 - 2 Glasses of wine per week  ?  Comment: rare  ? Drug use: No  ? Sexual activity: Yes  ?  Birth control/protection: None  ?Other Topics Concern  ? Not on file  ?Social History Narrative  ? Not on file  ? ?Social Determinants of Health  ? ?Financial Resource Strain: Not on file  ?Food Insecurity: Not on file  ?Transportation Needs: Not on file  ?Physical Activity: Not on file  ?Stress: Not on file  ?Social Connections: Not on file  ?Intimate  Partner Violence: Not on file  ? ? ?Physical Exam ? ? ? ? ? ?Future Appointments  ?Date Time Provider [place]  ?07/27/2021 11:30 AM [facility] PA/NP [facility] None  ?07/28/2021 10:00 AM [place], Debbra Riding, NP [facility] None  ?09/07/2021  8:30 AM Constance Haw, MD [facility]  ?09/20/2021  8:35 AM [facility] DEVICE REMOTES [facility]  ?12/20/2021  8:35 AM [facility] DEVICE REMOTES [facility]  ? ? ? ? ? ?[name], [place] ?[phone] ?Community [facility] Paramedic  ?07/01/21 ?

## 2021-07-02 NOTE — Progress Notes (Signed)
Remote ICD transmission.   

## 2021-07-07 ENCOUNTER — Other Ambulatory Visit (HOSPITAL_COMMUNITY): Payer: Self-pay | Admitting: *Deleted

## 2021-07-07 MED ORDER — HYDRALAZINE HCL 50 MG PO TABS: 50.0000 mg | ORAL_TABLET | Freq: Three times a day (TID) | ORAL | 6 refills | Status: DC

## 2021-07-07 MED ORDER — FENOFIBRATE 54 MG PO TABS: 54.0000 mg | ORAL_TABLET | Freq: Every day | ORAL | 6 refills | Status: AC

## 2021-07-08 ENCOUNTER — Other Ambulatory Visit (HOSPITAL_COMMUNITY): Payer: Self-pay

## 2021-07-08 NOTE — Progress Notes (Signed)
P/u 4 wks of bubble packs from pharmacy, took to pt to explain use. He easily understood.  ?Will f/u in 2 wks to check in .  ? ?He will get repatha delivered from pharmacy next Tuesday.  ? ?[name], [place]  ?[phone] ?07/08/2021 ? ?

## 2021-07-17 ENCOUNTER — Emergency Department (HOSPITAL_COMMUNITY): Payer: Medicare Other

## 2021-07-17 ENCOUNTER — Inpatient Hospital Stay (HOSPITAL_COMMUNITY)
Admission: EM | Admit: 2021-07-17 | Discharge: 2021-07-22 | DRG: 291 | Disposition: A | Discharge status: Home / Self Care | Payer: Medicare Other | Attending: Internal Medicine | Admitting: Internal Medicine

## 2021-07-17 DIAGNOSIS — I700 Atherosclerosis of aorta: Secondary | ICD-10-CM | POA: Diagnosis not present

## 2021-07-17 DIAGNOSIS — F10139 Alcohol abuse with withdrawal, unspecified: Secondary | ICD-10-CM | POA: Diagnosis present

## 2021-07-17 DIAGNOSIS — I272 Pulmonary hypertension, unspecified: Secondary | ICD-10-CM | POA: Diagnosis present

## 2021-07-17 DIAGNOSIS — R1013 Epigastric pain: Secondary | ICD-10-CM

## 2021-07-17 DIAGNOSIS — R0789 Other chest pain: Secondary | ICD-10-CM | POA: Diagnosis not present

## 2021-07-17 DIAGNOSIS — N183 Chronic kidney disease, stage 3 unspecified: Secondary | ICD-10-CM

## 2021-07-17 DIAGNOSIS — I2510 Atherosclerotic heart disease of native coronary artery without angina pectoris: Secondary | ICD-10-CM | POA: Diagnosis present

## 2021-07-17 DIAGNOSIS — Z7901 Long term (current) use of anticoagulants: Secondary | ICD-10-CM

## 2021-07-17 DIAGNOSIS — R112 Nausea with vomiting, unspecified: Secondary | ICD-10-CM | POA: Diagnosis not present

## 2021-07-17 DIAGNOSIS — I480 Paroxysmal atrial fibrillation: Secondary | ICD-10-CM | POA: Diagnosis present

## 2021-07-17 DIAGNOSIS — I5023 Acute on chronic systolic (congestive) heart failure: Secondary | ICD-10-CM | POA: Diagnosis present

## 2021-07-17 DIAGNOSIS — N179 Acute kidney failure, unspecified: Secondary | ICD-10-CM | POA: Diagnosis not present

## 2021-07-17 DIAGNOSIS — E669 Obesity, unspecified: Secondary | ICD-10-CM | POA: Diagnosis present

## 2021-07-17 DIAGNOSIS — F1010 Alcohol abuse, uncomplicated: Secondary | ICD-10-CM | POA: Diagnosis not present

## 2021-07-17 DIAGNOSIS — E1122 Type 2 diabetes mellitus with diabetic chronic kidney disease: Secondary | ICD-10-CM | POA: Diagnosis present

## 2021-07-17 DIAGNOSIS — Z79899 Other long term (current) drug therapy: Secondary | ICD-10-CM

## 2021-07-17 DIAGNOSIS — Z794 Long term (current) use of insulin: Secondary | ICD-10-CM

## 2021-07-17 DIAGNOSIS — R079 Chest pain, unspecified: Secondary | ICD-10-CM | POA: Diagnosis not present

## 2021-07-17 DIAGNOSIS — I428 Other cardiomyopathies: Secondary | ICD-10-CM | POA: Diagnosis present

## 2021-07-17 DIAGNOSIS — Z95810 Presence of automatic (implantable) cardiac defibrillator: Secondary | ICD-10-CM | POA: Diagnosis not present

## 2021-07-17 DIAGNOSIS — F32A Depression, unspecified: Secondary | ICD-10-CM

## 2021-07-17 DIAGNOSIS — Z86718 Personal history of other venous thrombosis and embolism: Secondary | ICD-10-CM | POA: Diagnosis not present

## 2021-07-17 DIAGNOSIS — G4730 Sleep apnea, unspecified: Secondary | ICD-10-CM | POA: Diagnosis present

## 2021-07-17 DIAGNOSIS — Z6835 Body mass index (BMI) 35.0-35.9, adult: Secondary | ICD-10-CM

## 2021-07-17 DIAGNOSIS — N1411 Contrast-induced nephropathy: Secondary | ICD-10-CM | POA: Diagnosis not present

## 2021-07-17 DIAGNOSIS — T508X5A Adverse effect of diagnostic agents, initial encounter: Secondary | ICD-10-CM | POA: Diagnosis not present

## 2021-07-17 DIAGNOSIS — E785 Hyperlipidemia, unspecified: Secondary | ICD-10-CM | POA: Diagnosis present

## 2021-07-17 DIAGNOSIS — I130 Hypertensive heart and chronic kidney disease with heart failure and stage 1 through stage 4 chronic kidney disease, or unspecified chronic kidney disease: Secondary | ICD-10-CM | POA: Diagnosis present

## 2021-07-17 DIAGNOSIS — I160 Hypertensive urgency: Secondary | ICD-10-CM | POA: Diagnosis present

## 2021-07-17 DIAGNOSIS — N1832 Chronic kidney disease, stage 3b: Secondary | ICD-10-CM | POA: Diagnosis present

## 2021-07-17 DIAGNOSIS — R918 Other nonspecific abnormal finding of lung field: Secondary | ICD-10-CM | POA: Diagnosis not present

## 2021-07-17 DIAGNOSIS — Y92239 Unspecified place in hospital as the place of occurrence of the external cause: Secondary | ICD-10-CM | POA: Diagnosis present

## 2021-07-17 DIAGNOSIS — I10 Essential (primary) hypertension: Secondary | ICD-10-CM

## 2021-07-17 DIAGNOSIS — Z888 Allergy status to other drugs, medicaments and biological substances status: Secondary | ICD-10-CM

## 2021-07-17 DIAGNOSIS — Z86711 Personal history of pulmonary embolism: Secondary | ICD-10-CM

## 2021-07-17 DIAGNOSIS — I248 Other forms of acute ischemic heart disease: Secondary | ICD-10-CM | POA: Diagnosis present

## 2021-07-17 DIAGNOSIS — Z823 Family history of stroke: Secondary | ICD-10-CM

## 2021-07-17 DIAGNOSIS — H9193 Unspecified hearing loss, bilateral: Secondary | ICD-10-CM | POA: Diagnosis present

## 2021-07-17 DIAGNOSIS — J9601 Acute respiratory failure with hypoxia: Secondary | ICD-10-CM | POA: Diagnosis present

## 2021-07-17 DIAGNOSIS — I517 Cardiomegaly: Secondary | ICD-10-CM | POA: Diagnosis not present

## 2021-07-17 DIAGNOSIS — Z950 Presence of cardiac pacemaker: Secondary | ICD-10-CM | POA: Diagnosis not present

## 2021-07-17 DIAGNOSIS — G4733 Obstructive sleep apnea (adult) (pediatric): Secondary | ICD-10-CM | POA: Diagnosis present

## 2021-07-17 DIAGNOSIS — Z8249 Family history of ischemic heart disease and other diseases of the circulatory system: Secondary | ICD-10-CM

## 2021-07-17 DIAGNOSIS — I739 Peripheral vascular disease, unspecified: Secondary | ICD-10-CM | POA: Diagnosis present

## 2021-07-17 DIAGNOSIS — R1110 Vomiting, unspecified: Secondary | ICD-10-CM | POA: Diagnosis not present

## 2021-07-17 LAB — CBC WITH DIFFERENTIAL/PLATELET
Abs Immature Granulocytes: 0.05 10*3/uL (ref 0.00–0.07)
Basophils Absolute: 0 10*3/uL (ref 0.0–0.1)
Basophils Relative: 0 %
Eosinophils Absolute: 0 10*3/uL (ref 0.0–0.5)
Eosinophils Relative: 0 %
HCT: 42.3 % (ref 39.0–52.0)
Hemoglobin: 12.5 g/dL — ABNORMAL LOW (ref 13.0–17.0)
Immature Granulocytes: 1 %
Lymphocytes Relative: 12 %
Lymphs Abs: 0.9 10*3/uL (ref 0.7–4.0)
MCH: 28 pg (ref 26.0–34.0)
MCHC: 29.6 g/dL — ABNORMAL LOW (ref 30.0–36.0)
MCV: 94.8 fL (ref 80.0–100.0)
Monocytes Absolute: 0.3 10*3/uL (ref 0.1–1.0)
Monocytes Relative: 5 %
Neutro Abs: 5.8 10*3/uL (ref 1.7–7.7)
Neutrophils Relative %: 82 %
Platelets: 205 10*3/uL (ref 150–400)
RBC: 4.46 MIL/uL (ref 4.22–5.81)
RDW: 13.6 % (ref 11.5–15.5)
WBC: 7 10*3/uL (ref 4.0–10.5)
nRBC: 0.3 % — ABNORMAL HIGH (ref 0.0–0.2)

## 2021-07-17 LAB — COMPREHENSIVE METABOLIC PANEL
ALT: 38 U/L (ref 0–44)
AST: 43 U/L — ABNORMAL HIGH (ref 15–41)
Albumin: 4.2 g/dL (ref 3.5–5.0)
Alkaline Phosphatase: 49 U/L (ref 38–126)
Anion gap: 15 (ref 5–15)
BUN: 39 mg/dL — ABNORMAL HIGH (ref 6–20)
CO2: 25 mmol/L (ref 22–32)
Calcium: 9.9 mg/dL (ref 8.9–10.3)
Chloride: 106 mmol/L (ref 98–111)
Creatinine, Ser: 2.12 mg/dL — ABNORMAL HIGH (ref 0.61–1.24)
GFR, Estimated: 37 mL/min — ABNORMAL LOW (ref >60)
Glucose, Bld: 163 mg/dL — ABNORMAL HIGH (ref 70–99)
Potassium: 5.1 mmol/L (ref 3.5–5.1)
Sodium: 146 mmol/L — ABNORMAL HIGH (ref 135–145)
Total Bilirubin: 1.3 mg/dL — ABNORMAL HIGH (ref 0.3–1.2)
Total Protein: 8.6 g/dL — ABNORMAL HIGH (ref 6.5–8.1)

## 2021-07-17 LAB — BRAIN NATRIURETIC PEPTIDE: B Natriuretic Peptide: 525.4 pg/mL — ABNORMAL HIGH (ref 0.0–100.0)

## 2021-07-17 LAB — TROPONIN I (HIGH SENSITIVITY)
Troponin I (High Sensitivity): 30 ng/L — ABNORMAL HIGH (ref <18)
Troponin I (High Sensitivity): 40 ng/L — ABNORMAL HIGH (ref <18)

## 2021-07-17 LAB — LIPASE, BLOOD: Lipase: 36 U/L (ref 11–51)

## 2021-07-17 MED ORDER — FUROSEMIDE 10 MG/ML IJ SOLN
40.0000 mg | Freq: Two times a day (BID) | INTRAMUSCULAR | Status: DC
  Administered 2021-07-18 (×2): 40 mg via INTRAVENOUS
  Filled 2021-07-17 (×2): qty 4

## 2021-07-17 MED ORDER — PROCHLORPERAZINE EDISYLATE 10 MG/2ML IJ SOLN
10.0000 mg | Freq: Once | INTRAMUSCULAR | Status: AC
  Administered 2021-07-17: 10 mg via INTRAVENOUS
  Filled 2021-07-17: qty 2

## 2021-07-17 MED ORDER — HYDROMORPHONE HCL 1 MG/ML IJ SOLN
1.0000 mg | Freq: Once | INTRAMUSCULAR | Status: AC
  Administered 2021-07-17: 1 mg via INTRAVENOUS
  Filled 2021-07-17: qty 1

## 2021-07-17 MED ORDER — ONDANSETRON HCL 4 MG/2ML IJ SOLN
4.0000 mg | Freq: Four times a day (QID) | INTRAMUSCULAR | Status: DC | PRN
Start: 2021-07-17 — End: 2021-07-22

## 2021-07-17 MED ORDER — SODIUM CHLORIDE 0.9 % IV SOLN: INTRAVENOUS | Status: DC

## 2021-07-17 MED ORDER — ONDANSETRON HCL 4 MG PO TABS: 4.0000 mg | ORAL_TABLET | Freq: Four times a day (QID) | ORAL | Status: DC | PRN

## 2021-07-17 MED ORDER — SODIUM CHLORIDE 0.9 % IV SOLN: 8.0000 mg | Freq: Once | INTRAVENOUS | Status: DC

## 2021-07-17 MED ORDER — PANTOPRAZOLE SODIUM 40 MG IV SOLR
40.0000 mg | Freq: Two times a day (BID) | INTRAVENOUS | Status: DC
  Administered 2021-07-18 – 2021-07-22 (×10): 40 mg via INTRAVENOUS
  Filled 2021-07-17 (×10): qty 10

## 2021-07-17 MED ORDER — ENOXAPARIN SODIUM 40 MG/0.4ML IJ SOSY
40.0000 mg | PREFILLED_SYRINGE | INTRAMUSCULAR | Status: DC
  Administered 2021-07-18 (×2): 40 mg via SUBCUTANEOUS
  Filled 2021-07-17 (×2): qty 0.4

## 2021-07-17 MED ORDER — IOHEXOL 350 MG/ML SOLN
100.0000 mL | Freq: Once | INTRAVENOUS | Status: AC | PRN
  Administered 2021-07-17: 100 mL via INTRAVENOUS

## 2021-07-17 MED ORDER — SODIUM CHLORIDE 0.9 % IV SOLN: Freq: Once | INTRAVENOUS | Status: AC

## 2021-07-17 MED ORDER — LORAZEPAM 2 MG/ML IJ SOLN: 1.0000 mg | INTRAMUSCULAR | Status: DC | PRN

## 2021-07-17 MED ORDER — TRAZODONE HCL 50 MG PO TABS
25.0000 mg | ORAL_TABLET | Freq: Every evening | ORAL | Status: DC | PRN
  Administered 2021-07-19 – 2021-07-21 (×3): 25 mg via ORAL
  Filled 2021-07-17 (×3): qty 1

## 2021-07-17 MED ORDER — ACETAMINOPHEN 325 MG PO TABS
650.0000 mg | ORAL_TABLET | Freq: Four times a day (QID) | ORAL | Status: DC | PRN
  Administered 2021-07-19 – 2021-07-21 (×5): 650 mg via ORAL
  Filled 2021-07-17 (×5): qty 2

## 2021-07-17 MED ORDER — LABETALOL HCL 5 MG/ML IV SOLN
10.0000 mg | Freq: Once | INTRAVENOUS | Status: AC
  Administered 2021-07-17: 10 mg via INTRAVENOUS
  Filled 2021-07-17: qty 4

## 2021-07-17 MED ORDER — HYDRALAZINE HCL 20 MG/ML IJ SOLN
10.0000 mg | Freq: Once | INTRAMUSCULAR | Status: AC
  Administered 2021-07-17: 10 mg via INTRAVENOUS
  Filled 2021-07-17: qty 1

## 2021-07-17 MED ORDER — LABETALOL HCL 5 MG/ML IV SOLN
20.0000 mg | INTRAVENOUS | Status: DC | PRN
  Administered 2021-07-18 (×2): 20 mg via INTRAVENOUS
  Filled 2021-07-17 (×2): qty 4

## 2021-07-17 MED ORDER — ACETAMINOPHEN 650 MG RE SUPP: 650.0000 mg | Freq: Four times a day (QID) | RECTAL | Status: DC | PRN

## 2021-07-17 MED ORDER — HYDRALAZINE HCL 20 MG/ML IJ SOLN
10.0000 mg | Freq: Four times a day (QID) | INTRAMUSCULAR | Status: DC | PRN
  Administered 2021-07-18 – 2021-07-19 (×3): 10 mg via INTRAVENOUS
  Filled 2021-07-17 (×3): qty 1

## 2021-07-17 MED ORDER — MAGNESIUM HYDROXIDE 400 MG/5ML PO SUSP: 30.0000 mL | Freq: Every day | ORAL | Status: DC | PRN

## 2021-07-17 MED ORDER — DIPHENHYDRAMINE HCL 50 MG/ML IJ SOLN
25.0000 mg | Freq: Once | INTRAMUSCULAR | Status: AC
  Administered 2021-07-17: 25 mg via INTRAVENOUS
  Filled 2021-07-17: qty 1

## 2021-07-17 MED ORDER — THIAMINE HCL 100 MG/ML IJ SOLN
Freq: Once | INTRAVENOUS | Status: AC
  Filled 2021-07-17: qty 1000

## 2021-07-17 MED ORDER — PANTOPRAZOLE SODIUM 40 MG IV SOLR
40.0000 mg | Freq: Once | INTRAVENOUS | Status: AC
Start: 2021-07-17 — End: 2021-07-17
  Administered 2021-07-17: 40 mg via INTRAVENOUS
  Filled 2021-07-17: qty 10

## 2021-07-17 MED ORDER — ONDANSETRON HCL 4 MG/2ML IJ SOLN
INTRAMUSCULAR | Status: AC
  Administered 2021-07-17: 8 mg
  Filled 2021-07-17: qty 4

## 2021-07-17 NOTE — Assessment & Plan Note (Addendum)
continue Zoloft

## 2021-07-17 NOTE — Assessment & Plan Note (Addendum)
Resolved

## 2021-07-17 NOTE — ED Provider Notes (Signed)
?[facility] ?Provider Note ? ? ?CSN: [id number] ?Arrival date & time: 07/17/21  1438 ? ?  ? ?History ? ?Chief Complaint  ?Patient presents with  ? Chest Pain  ? ? ?Troy Rush is a 51 y.o. male. ? ?The history is provided by the patient.  ?Chest Pain ?Pain location:  Epigastric and substernal area ?Pain quality: aching   ?Pain radiates to:  Does not radiate ?Pain severity:  Mild ?Onset quality:  Gradual ?Duration:  4 hours ?Timing:  Constant ?Progression:  Unchanged ?Chronicity:  New ?Context: at rest   ?Relieved by:  Nothing ?Worsened by:  Nothing ?Associated symptoms: abdominal pain   ?Associated symptoms: no AICD problem, no altered mental status, no anorexia, no anxiety, no back pain, no claudication, no cough, no diaphoresis, no dizziness, no dysphagia, no fatigue, no fever, no headache, no heartburn, no lower extremity edema, no nausea, no near-syncope, no numbness, no orthopnea, no palpitations, no PND, no shortness of breath, no syncope, no vomiting and no weakness   ?Risk factors comment:  CHF, deaf, DM, HTN ? ?  ? ?Home Medications ?Prior to Admission medications   ?Medication Sig Start Date End Date Taking? Authorizing Provider  ?acetaminophen (TYLENOL) 325 MG tablet Take 2 tablets (650 mg total) by mouth every 4 (four) hours as needed for mild pain (or temp > 37.5 C (99.5 F)). 10/07/20   Angiulli, Lavon Paganini, PA-C  ?apixaban (ELIQUIS) 5 MG TABS tablet TAKE 1 TABLET(5 MG) BY MOUTH TWICE DAILY 10/07/20   Angiulli, Lavon Paganini, PA-C  ?atorvastatin (LIPITOR) 80 MG tablet Take 1 tablet (80 mg total) by mouth at bedtime. 10/07/20   Angiulli, Lavon Paganini, PA-C  ?carvedilol (COREG) 25 MG tablet TAKE 1 TABLET (25 MG TOTAL) BY MOUTH 2 (TWO) TIMES DAILY. 01/04/21 01/04/22  Consuelo Pandy, PA-C  ?Cholecalciferol (D3-1000 PO) Take 1,000 Units by mouth daily.    [provider]  ?Continuous Blood Gluc Sensor (FREESTYLE LIBRE 3 SENSOR) MISC 1 application by Does not apply route  every 14 (fourteen) days. Place 1 sensor on the skin every 14 days. Use to check glucose continuously ?Patient not taking: Reported on 07/01/2021 04/19/21   Vevelyn Francois, NP  ?dapagliflozin propanediol (FARXIGA) 10 MG TABS tablet Take 1 tablet (10 mg total) by mouth daily before breakfast. 10/27/20   Consuelo Pandy, PA-C  ?Evolocumab (REPATHA SURECLICK) 244 MG/ML SOAJ Inject 140 mg into the skin every 14 (fourteen) days. 12/29/20   Larey Dresser, MD  ?fenofibrate 54 MG tablet Take 1 tablet (54 mg total) by mouth daily. 07/07/21   Larey Dresser, MD  ?furosemide (LASIX) 80 MG tablet Take 1 tablet (80 mg total) by mouth 2 (two) times daily. 05/18/21   Larey Dresser, MD  ?hydrALAZINE (APRESOLINE) 50 MG tablet Take 1 tablet (50 mg total) by mouth 3 (three) times daily. 07/07/21   Larey Dresser, MD  ?insulin glargine (LANTUS SOLOSTAR) 100 UNIT/ML Solostar Pen Inject 35 Units into the skin at bedtime. 01/15/21 01/15/22  Vevelyn Francois, NP  ?Insulin Pen Needle (PEN NEEDLES) 31G X 6 MM MISC 1 pen by Does not apply route daily. 01/15/21 01/15/22  Vevelyn Francois, NP  ?isosorbide mononitrate (IMDUR) 60 MG 24 hr tablet Take 1 tablet (60 mg total) by mouth daily. 10/07/20   Angiulli, Lavon Paganini, PA-C  ?latanoprost (XALATAN) 0.005 % ophthalmic solution Place 1 drop into both eyes at bedtime. 01/20/20   [provider]  ?meclizine Johnathan Hausen)  25 MG tablet Take 1 tablet (25 mg total) by mouth 3 (three) times daily as needed for dizziness. ?Patient not taking: Reported on 07/01/2021 10/07/20   Cathlyn Parsons, PA-C  ?metolazone (ZAROXOLYN) 2.5 MG tablet Take 1 tablet (2.5 mg total) by mouth as directed. Per Heart Failure Team instructions. ?Patient not taking: Reported on 07/01/2021 02/17/21 05/25/22  Darrick Grinder D, NP  ?pantoprazole (PROTONIX) 40 MG tablet Take 1 tablet (40 mg total) by mouth 2 (two) times daily. 10/07/20   Angiulli, Lavon Paganini, PA-C  ?sertraline (ZOLOFT) 50 MG tablet Take 1 tablet (50 mg total) by mouth  daily. 03/18/21 09/14/21  Vevelyn Francois, NP  ?VASCEPA 1 g capsule Take 2 capsules (2 g total) by mouth 2 (two) times daily. 10/07/20   Angiulli, Lavon Paganini, PA-C  ?   ? ?Allergies    ?Lisinopril   ? ?Review of Systems   ?Review of Systems  ?Constitutional:  Negative for diaphoresis, fatigue and fever.  ?HENT:  Negative for trouble swallowing.   ?Respiratory:  Negative for cough and shortness of breath.   ?Cardiovascular:  Positive for chest pain. Negative for palpitations, orthopnea, claudication, syncope, PND and near-syncope.  ?Gastrointestinal:  Positive for abdominal pain. Negative for anorexia, heartburn, nausea and vomiting.  ?Musculoskeletal:  Negative for back pain.  ?Neurological:  Negative for dizziness, weakness, numbness and headaches.  ? ?Physical Exam ?Updated Vital Signs ? ?ED Triage Vitals  ?Enc Vitals Group  ?   BP 07/17/21 1446 (!) 231/109  ?   Pulse Rate 07/17/21 1446 80  ?   Resp 07/17/21 1446 (!) 22  ?   Temp 07/17/21 1446 97.8 ?F (36.6 ?C)  ?   Temp Source 07/17/21 1446 Oral  ?   SpO2 07/17/21 1446 95 %  ?   Weight --   ?   Height --   ?   Head Circumference --   ?   Peak Flow --   ?   Pain Score 07/17/21 1454 7  ?   Pain Loc --   ?   Pain Edu? --   ?   Excl. in [place]? --   ? ? ?Physical Exam ?Vitals and nursing note reviewed.  ?Constitutional:   ?   General: He is in acute distress.  ?   Appearance: He is well-developed. He is not ill-appearing.  ?HENT:  ?   Head: Normocephalic and atraumatic.  ?Eyes:  ?   Extraocular Movements: Extraocular movements intact.  ?   Conjunctiva/sclera: Conjunctivae normal.  ?   Pupils: Pupils are equal, round, and reactive to light.  ?Cardiovascular:  ?   Rate and Rhythm: Normal rate and regular rhythm.  ?   Heart sounds: Normal heart sounds. No murmur heard. ?Pulmonary:  ?   Effort: Pulmonary effort is normal. No respiratory distress.  ?   Breath sounds: Normal breath sounds.  ?Chest:  ?   Chest wall: No tenderness.  ?Abdominal:  ?   Palpations: Abdomen is soft.  ?    Tenderness: There is abdominal tenderness.  ?Musculoskeletal:     ?   General: No swelling. Normal range of motion.  ?   Cervical back: Normal range of motion and neck supple.  ?   Right lower leg: No edema.  ?   Left lower leg: No edema.  ?Skin: ?   General: Skin is warm and dry.  ?   Capillary Refill: Capillary refill takes less than 2 seconds.  ?Neurological:  ?  General: No focal deficit present.  ?   Mental Status: He is alert.  ?Psychiatric:     ?   Mood and Affect: Mood normal.  ? ? ?ED Results / Procedures / Treatments   ?Labs ?(all labs ordered are listed, but only abnormal results are displayed) ?Labs Reviewed  ?CBC WITH DIFFERENTIAL/PLATELET - Abnormal; Notable for the following components:  ?    Result Value  ? Hemoglobin 12.5 (*)   ? MCHC 29.6 (*)   ? nRBC 0.3 (*)   ? All other components within normal limits  ?COMPREHENSIVE METABOLIC PANEL - Abnormal; Notable for the following components:  ? Sodium 146 (*)   ? Glucose, Bld 163 (*)   ? BUN 39 (*)   ? Creatinine, Ser 2.12 (*)   ? Total Protein 8.6 (*)   ? AST 43 (*)   ? Total Bilirubin 1.3 (*)   ? GFR, Estimated 37 (*)   ? All other components within normal limits  ?BRAIN NATRIURETIC PEPTIDE - Abnormal; Notable for the following components:  ? B Natriuretic Peptide 525.4 (*)   ? All other components within normal limits  ?TROPONIN I (HIGH SENSITIVITY) - Abnormal; Notable for the following components:  ? Troponin I (High Sensitivity) 30 (*)   ? All other components within normal limits  ?TROPONIN I (HIGH SENSITIVITY) - Abnormal; Notable for the following components:  ? Troponin I (High Sensitivity) 40 (*)   ? All other components within normal limits  ?LIPASE, BLOOD  ? ? ?EKG ?EKG Interpretation ? ?Date/Time:  Saturday July 17 2021 14:45:30 EDT ?Ventricular Rate:  81 ?PR Interval:  220 ?QRS Duration: 89 ?QT Interval:  433 ?QTC Calculation: 503 ?R Axis:   51 ?Text Interpretation: Sinus rhythm Prolonged PR interval LAE, consider biatrial enlargement No  significant change since last tracing Confirmed by Lennice Sites [phone]) on 07/17/2021 2:58:03 PM ? ?Radiology ?DG Chest Portable 1 View ? ?Result Date: 07/17/2021 ?CLINICAL DATA:  Chest pain/central chest press

## 2021-07-17 NOTE — H&P (Addendum)
?  ?  ?[facility] ? ? ?PATIENT NAME: Troy Rush   ? ?MR#:  [id number] ? ?DATE OF BIRTH:  10-01-1970 ? ?DATE OF ADMISSION:  07/17/2021 ? ?PRIMARY CARE PHYSICIAN: Vevelyn Francois, NP  ? ?Patient is coming from: Home ? ?REQUESTING/REFERRING PHYSICIAN: Lennice Sites, DO ? ?CHIEF COMPLAINT:  ? ?Chief Complaint  ?Patient presents with  ? Chest Pain  ? ? ?HISTORY OF PRESENT ILLNESS:  ?Troy Rush is a 51 y.o. African-American male with medical history significant for CHF, depression, type 2 diabetes mellitus, lower extremity DVT, dyslipidemia, end-stage renal disease not on hemodialysis yet, hypertension, status post AICD, nonischemic cardiomyopathy, pancreatitis and sleep apnea, who presented to the ER with acute onset of intractable nausea and vomiting with substernal chest pain and epigastric abdominal pain.  No diarrhea or melena or bright red blood per rectum.  He denied any radiation of his chest pain.  He admitted to coughing with occasional expectoration of yellowish sputum.  He has been having mild dyspnea without wheezing.  No dysuria, oliguria or hematuria or flank pain.  His BP was significantly elevated in the ER.  He is not on home oxygen and required 2 L of O2 by nasal cannula extremity was down to 64% on room air and later up to 97% on 2 L of O2 by nasal cannula. ? ?ED Course: When he came to the ER, BP was 221/102 with respiratory rate was normal later 22 with otherwise normal vital signs.  His pulse oximetry was 98% on 2 L O2 by nasal cannula.  Labs revealedSodium 146 with glucose of 163, BUN of 39 creatinine 2.12 compared to 54/3.35 on 05/25/2021.  Total bili was 1.3 and thirteen 8.6 with albumin of 4.2.  BNP was 525.4 and high-sensitivity troponin I was 30 and later 40.  CBC showed hemoglobin 12.5 hematocrit 42.3 and was otherwise unremarkable.  ? ?EKG as reviewed by me : EKG showed normal sinus rhythm with a rate of 81 with prolonged PR interval, left atrial enlargement and poor R wave  progression. ?Imaging:  Portable chest ray showed no acute cardiopulmonary disease.  It showed stable cardiomegaly and mild vascular congestion.  Chest CTA of the abdomen and pelvis showed cardiomegaly, pacemaker, some coronary artery calcification and aortic atherosclerosis calcification with no aneurysm or dissection.  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 ? ?The patient was given 1 mg of IV Dilaudid, 10 mg of IV hydralazine, 10 mg IV labetalol, 10 mg of IV Compazine and 25 mg of IV Benadryl as well as 40 mg of IV Protonix and hydration with IV normal saline at 125 mill per hour.  He will be admitted to a medical telemetry observation bed for further evaluation and management. ?PAST MEDICAL HISTORY:  ? ?Past Medical History:  ?Diagnosis Date  ? AICD (automatic cardioverter/defibrillator) present 10/14/2016  ? Bilateral deafness   ? Bronchitis   ? CHF (congestive heart failure) ([place])   ? Depression   ? Diabetes mellitus without complication ([place])   ? DVT, lower extremity ([place]) 07/12/2019  ? Dyslipidemia   ? ESRD (end stage renal disease) ([place])   ? Reports stage IV CKD, Has not started dialysis d/t inability to communicate w/ nephrologist on his first appointment  ? Headache 08/2019  ? Hypertension   ? ICD (implantable cardioverter-defibrillator) in place   ? Insomnia 08/2019  ? NICM (  nonischemic cardiomyopathy) ([place]) 09/15/2016  ? Pancreatitis   ? Renal disorder   ? Sleep apnea   ? Vitamin D deficiency 08/2019  ? ? ?PAST SURGICAL HISTORY:  ? ?Past Surgical History:  ?Procedure Laterality Date  ? ATRIAL FIBRILLATION ABLATION N/A 03/02/2021  ? Procedure: ATRIAL FIBRILLATION ABLATION;  Surgeon: Constance Haw, MD;  Location: [facility];  Service: Cardiovascular;  Laterality: N/A;  ? BACK SURGERY    ? CARDIAC CATHETERIZATION    ? ESOPHAGOGASTRODUODENOSCOPY (EGD)  WITH PROPOFOL N/A 02/02/2020  ? Procedure: ESOPHAGOGASTRODUODENOSCOPY (EGD) WITH PROPOFOL;  Surgeon: Arta Silence, MD;  Location: [facility];  Service: Endoscopy;  Laterality: N/A;  ? ICD IMPLANT  10/14/2016  ? ICD IMPLANT N/A 10/14/2016  ? Procedure: ICD Implant;  Surgeon: Deboraha Sprang, MD;  Location: [facility];  Service: Cardiovascular;  Laterality: N/A;  ? PRESSURE SENSOR/CARDIOMEMS N/A 09/10/2020  ? Procedure: PRESSURE SENSOR/CARDIOMEMS;  Surgeon: Larey Dresser, MD;  Location: [facility];  Service: Cardiovascular;  Laterality: N/A;  ? RIGHT HEART CATH N/A 05/21/2020  ? Procedure: RIGHT HEART CATH;  Surgeon: Larey Dresser, MD;  Location: [facility];  Service: Cardiovascular;  Laterality: N/A;  ? WRIST SURGERY    ? ? ?SOCIAL HISTORY:  ? ?Social History  ? ?Tobacco Use  ? Smoking status: Never  ? Smokeless tobacco: Never  ?Substance Use Topics  ? Alcohol use: Not Currently  ?  Alcohol/week: 1.0 - 2.0 standard drink  ?  Types: 1 - 2 Glasses of wine per week  ?  Comment: rare  ? ? ?FAMILY HISTORY:  ? ?Family History  ?Problem Relation Age of Onset  ? Hypertension Mother   ? Healthy Father   ? Stroke Father   ? ? ?DRUG ALLERGIES:  ? ?Allergies  ?Allergen Reactions  ? Lisinopril Cough  ? ? ?REVIEW OF SYSTEMS:  ? ?ROS ?As per history of present illness. All pertinent systems were reviewed above. Constitutional, HEENT, cardiovascular, respiratory, GI, GU, musculoskeletal, neuro, psychiatric, endocrine, integumentary and hematologic systems were reviewed and are otherwise negative/unremarkable except for positive findings mentioned above in the HPI. ? ? ?MEDICATIONS AT HOME:  ? ?Prior to Admission medications   ?Medication Sig Start Date End Date Taking? Authorizing Provider  ?acetaminophen (TYLENOL) 325 MG tablet Take 2 tablets (650 mg total) by mouth every 4 (four) hours as needed for mild pain (or temp > 37.5 C (99.5 F)). 10/07/20   Angiulli, Lavon Paganini, PA-C  ?apixaban (ELIQUIS) 5 MG  TABS tablet TAKE 1 TABLET(5 MG) BY MOUTH TWICE DAILY 10/07/20   Angiulli, Lavon Paganini, PA-C  ?atorvastatin (LIPITOR) 80 MG tablet Take 1 tablet (80 mg total) by mouth at bedtime. 10/07/20   Angiulli, Lavon Paganini, PA-C  ?carvedilol (COREG) 25 MG tablet TAKE 1 TABLET (25 MG TOTAL) BY MOUTH 2 (TWO) TIMES DAILY. 01/04/21 01/04/22  Consuelo Pandy, PA-C  ?Cholecalciferol (D3-1000 PO) Take 1,000 Units by mouth daily.    [provider]  ?Continuous Blood Gluc Sensor (FREESTYLE LIBRE 3 SENSOR) MISC 1 application by Does not apply route every 14 (fourteen) days. Place 1 sensor on the skin every 14 days. Use to check glucose continuously ?Patient not taking: Reported on 07/01/2021 04/19/21   Vevelyn Francois, NP  ?dapagliflozin propanediol (FARXIGA) 10 MG TABS tablet Take 1 tablet (10 mg total) by mouth daily before breakfast. 10/27/20   Consuelo Pandy, PA-C  ?Evolocumab (REPATHA SURECLICK) 245 MG/ML SOAJ Inject 140 mg into the  skin every 14 (fourteen) days. 12/29/20   Larey Dresser, MD  ?fenofibrate 54 MG tablet Take 1 tablet (54 mg total) by mouth daily. 07/07/21   Larey Dresser, MD  ?furosemide (LASIX) 80 MG tablet Take 1 tablet (80 mg total) by mouth 2 (two) times daily. 05/18/21   Larey Dresser, MD  ?hydrALAZINE (APRESOLINE) 50 MG tablet Take 1 tablet (50 mg total) by mouth 3 (three) times daily. 07/07/21   Larey Dresser, MD  ?insulin glargine (LANTUS SOLOSTAR) 100 UNIT/ML Solostar Pen Inject 35 Units into the skin at bedtime. 01/15/21 01/15/22  Vevelyn Francois, NP  ?Insulin Pen Needle (PEN NEEDLES) 31G X 6 MM MISC 1 pen by Does not apply route daily. 01/15/21 01/15/22  Vevelyn Francois, NP  ?isosorbide mononitrate (IMDUR) 60 MG 24 hr tablet Take 1 tablet (60 mg total) by mouth daily. 10/07/20   Angiulli, Lavon Paganini, PA-C  ?latanoprost (XALATAN) 0.005 % ophthalmic solution Place 1 drop into both eyes at bedtime. 01/20/20   [provider]  ?meclizine (ANTIVERT) 25 MG tablet Take 1 tablet (25 mg total) by  mouth 3 (three) times daily as needed for dizziness. 10/07/20   Angiulli, Lavon Paganini, PA-C  ?metolazone (ZAROXOLYN) 2.5 MG tablet Take 1 tablet (2.5 mg total) by mouth as directed. Per Heart Failure Team instructions. 11/9/2

## 2021-07-17 NOTE — ED Triage Notes (Signed)
Pt coming from home via [facility] with complaints of central chest pressure that started this AM. Pt reports that he drank about 12 oz of bourbon with his roommate yesterday. EMS reports pt vomited x1 on the way. EMS gave pt zofran IM and 324 of aspirin PTA.  ? ?BP 180 ?HR 68 ?CBG 189 ?

## 2021-07-17 NOTE — Assessment & Plan Note (Addendum)
-   The patient will be placed on supplement coverage with NovoLog. ?-Semglee 10U daily. Holding [place] ?ISS accuchecks.  ?

## 2021-07-17 NOTE — ED Notes (Signed)
[phone] Pts brother by the name of Kirk Ruths said he has been calling all day and just wants an update on his brother, said they live together and would like to know how he's doing.  ?

## 2021-07-17 NOTE — Assessment & Plan Note (Addendum)
Baseline Cr 2.8. trending upwards.  ?

## 2021-07-17 NOTE — ED Notes (Signed)
Patient transported to CT 

## 2021-07-17 NOTE — Assessment & Plan Note (Deleted)
-   We will continue hydralazine and Coreg. ?

## 2021-07-17 NOTE — ED Notes (Signed)
Caregiver/roommate August Albino [phone] would like an update ?

## 2021-07-17 NOTE — Assessment & Plan Note (Addendum)
On Eliquis

## 2021-07-17 NOTE — ED Notes (Signed)
Lennice Sites, DO gave a verbal order to Primary RN [facility] and Secondary RN Larene Beach as witness to give 8 mg of Zofran IV.  ?

## 2021-07-17 NOTE — Assessment & Plan Note (Addendum)
-   We will continue statin therapy and fenofibrate.  The patient is also on Repatha (not on formulary here) ?

## 2021-07-17 NOTE — Assessment & Plan Note (Addendum)
Monitor for withdrawals ?

## 2021-07-17 NOTE — Assessment & Plan Note (Addendum)
BP remains uncontrolled.  ?Cardiology adjust meds.  ?IV prn has been ordered.  ? ?

## 2021-07-18 ENCOUNTER — Other Ambulatory Visit: Payer: Self-pay

## 2021-07-18 ENCOUNTER — Encounter (HOSPITAL_COMMUNITY): Payer: Self-pay | Admitting: Family Medicine

## 2021-07-18 DIAGNOSIS — E785 Hyperlipidemia, unspecified: Secondary | ICD-10-CM | POA: Diagnosis present

## 2021-07-18 DIAGNOSIS — I160 Hypertensive urgency: Secondary | ICD-10-CM | POA: Diagnosis present

## 2021-07-18 DIAGNOSIS — N1832 Chronic kidney disease, stage 3b: Secondary | ICD-10-CM | POA: Diagnosis present

## 2021-07-18 DIAGNOSIS — I130 Hypertensive heart and chronic kidney disease with heart failure and stage 1 through stage 4 chronic kidney disease, or unspecified chronic kidney disease: Secondary | ICD-10-CM | POA: Diagnosis present

## 2021-07-18 DIAGNOSIS — F1010 Alcohol abuse, uncomplicated: Secondary | ICD-10-CM | POA: Diagnosis not present

## 2021-07-18 DIAGNOSIS — R112 Nausea with vomiting, unspecified: Secondary | ICD-10-CM | POA: Diagnosis present

## 2021-07-18 DIAGNOSIS — Z7901 Long term (current) use of anticoagulants: Secondary | ICD-10-CM | POA: Diagnosis not present

## 2021-07-18 DIAGNOSIS — R079 Chest pain, unspecified: Secondary | ICD-10-CM | POA: Diagnosis not present

## 2021-07-18 DIAGNOSIS — T508X5A Adverse effect of diagnostic agents, initial encounter: Secondary | ICD-10-CM | POA: Diagnosis not present

## 2021-07-18 DIAGNOSIS — I248 Other forms of acute ischemic heart disease: Secondary | ICD-10-CM | POA: Diagnosis present

## 2021-07-18 DIAGNOSIS — J9601 Acute respiratory failure with hypoxia: Secondary | ICD-10-CM

## 2021-07-18 DIAGNOSIS — I700 Atherosclerosis of aorta: Secondary | ICD-10-CM | POA: Diagnosis present

## 2021-07-18 DIAGNOSIS — N183 Chronic kidney disease, stage 3 unspecified: Secondary | ICD-10-CM | POA: Diagnosis not present

## 2021-07-18 DIAGNOSIS — R1013 Epigastric pain: Secondary | ICD-10-CM | POA: Diagnosis present

## 2021-07-18 DIAGNOSIS — Y92239 Unspecified place in hospital as the place of occurrence of the external cause: Secondary | ICD-10-CM | POA: Diagnosis present

## 2021-07-18 DIAGNOSIS — I428 Other cardiomyopathies: Secondary | ICD-10-CM | POA: Diagnosis present

## 2021-07-18 DIAGNOSIS — F32A Depression, unspecified: Secondary | ICD-10-CM | POA: Diagnosis present

## 2021-07-18 DIAGNOSIS — E1122 Type 2 diabetes mellitus with diabetic chronic kidney disease: Secondary | ICD-10-CM | POA: Diagnosis present

## 2021-07-18 DIAGNOSIS — E669 Obesity, unspecified: Secondary | ICD-10-CM | POA: Diagnosis present

## 2021-07-18 DIAGNOSIS — Z95810 Presence of automatic (implantable) cardiac defibrillator: Secondary | ICD-10-CM | POA: Diagnosis not present

## 2021-07-18 DIAGNOSIS — Z6835 Body mass index (BMI) 35.0-35.9, adult: Secondary | ICD-10-CM | POA: Diagnosis not present

## 2021-07-18 DIAGNOSIS — N179 Acute kidney failure, unspecified: Secondary | ICD-10-CM | POA: Diagnosis not present

## 2021-07-18 DIAGNOSIS — F10139 Alcohol abuse with withdrawal, unspecified: Secondary | ICD-10-CM | POA: Diagnosis present

## 2021-07-18 DIAGNOSIS — N1411 Contrast-induced nephropathy: Secondary | ICD-10-CM | POA: Diagnosis not present

## 2021-07-18 DIAGNOSIS — G4733 Obstructive sleep apnea (adult) (pediatric): Secondary | ICD-10-CM | POA: Diagnosis present

## 2021-07-18 DIAGNOSIS — I480 Paroxysmal atrial fibrillation: Secondary | ICD-10-CM | POA: Diagnosis present

## 2021-07-18 DIAGNOSIS — Z86718 Personal history of other venous thrombosis and embolism: Secondary | ICD-10-CM | POA: Diagnosis not present

## 2021-07-18 DIAGNOSIS — I5023 Acute on chronic systolic (congestive) heart failure: Secondary | ICD-10-CM

## 2021-07-18 DIAGNOSIS — I272 Pulmonary hypertension, unspecified: Secondary | ICD-10-CM | POA: Diagnosis present

## 2021-07-18 LAB — CBC
HCT: 41.1 % (ref 39.0–52.0)
Hemoglobin: 12.1 g/dL — ABNORMAL LOW (ref 13.0–17.0)
MCH: 27.9 pg (ref 26.0–34.0)
MCHC: 29.4 g/dL — ABNORMAL LOW (ref 30.0–36.0)
MCV: 94.9 fL (ref 80.0–100.0)
Platelets: 212 10*3/uL (ref 150–400)
RBC: 4.33 MIL/uL (ref 4.22–5.81)
RDW: 13.7 % (ref 11.5–15.5)
WBC: 8.3 10*3/uL (ref 4.0–10.5)
nRBC: 0.2 % (ref 0.0–0.2)

## 2021-07-18 LAB — BASIC METABOLIC PANEL
Anion gap: 12 (ref 5–15)
BUN: 45 mg/dL — ABNORMAL HIGH (ref 6–20)
CO2: 27 mmol/L (ref 22–32)
Calcium: 9.1 mg/dL (ref 8.9–10.3)
Chloride: 105 mmol/L (ref 98–111)
Creatinine, Ser: 2.93 mg/dL — ABNORMAL HIGH (ref 0.61–1.24)
GFR, Estimated: 25 mL/min — ABNORMAL LOW (ref >60)
Glucose, Bld: 186 mg/dL — ABNORMAL HIGH (ref 70–99)
Potassium: 4.9 mmol/L (ref 3.5–5.1)
Sodium: 144 mmol/L (ref 135–145)

## 2021-07-18 LAB — GLUCOSE, CAPILLARY
Glucose-Capillary: 172 mg/dL — ABNORMAL HIGH (ref 70–99)
Glucose-Capillary: 224 mg/dL — ABNORMAL HIGH (ref 70–99)

## 2021-07-18 LAB — HEMOGLOBIN A1C
Hgb A1c MFr Bld: 7.3 % — ABNORMAL HIGH (ref 4.8–5.6)
Mean Plasma Glucose: 162.81 mg/dL

## 2021-07-18 LAB — HIV ANTIBODY (ROUTINE TESTING W REFLEX): HIV Screen 4th Generation wRfx: NONREACTIVE

## 2021-07-18 MED ORDER — ISOSORBIDE MONONITRATE ER 60 MG PO TB24
60.0000 mg | ORAL_TABLET | Freq: Every day | ORAL | Status: DC
  Administered 2021-07-18 – 2021-07-20 (×3): 60 mg via ORAL
  Filled 2021-07-18 (×3): qty 1

## 2021-07-18 MED ORDER — HYDRALAZINE HCL 50 MG PO TABS: 50.0000 mg | ORAL_TABLET | Freq: Three times a day (TID) | ORAL | Status: DC

## 2021-07-18 MED ORDER — LORAZEPAM 2 MG/ML IJ SOLN: 1.0000 mg | INTRAMUSCULAR | Status: AC | PRN

## 2021-07-18 MED ORDER — THIAMINE HCL 100 MG PO TABS
100.0000 mg | ORAL_TABLET | Freq: Every day | ORAL | Status: DC
  Administered 2021-07-18 – 2021-07-22 (×5): 100 mg via ORAL
  Filled 2021-07-18 (×5): qty 1

## 2021-07-18 MED ORDER — LORAZEPAM 1 MG PO TABS: 1.0000 mg | ORAL_TABLET | ORAL | Status: AC | PRN

## 2021-07-18 MED ORDER — CARVEDILOL 25 MG PO TABS
25.0000 mg | ORAL_TABLET | Freq: Two times a day (BID) | ORAL | Status: DC
  Administered 2021-07-18 – 2021-07-22 (×9): 25 mg via ORAL
  Filled 2021-07-18 (×9): qty 1

## 2021-07-18 MED ORDER — HYDRALAZINE HCL 50 MG PO TABS
50.0000 mg | ORAL_TABLET | Freq: Three times a day (TID) | ORAL | Status: DC
  Administered 2021-07-18 – 2021-07-20 (×6): 50 mg via ORAL
  Filled 2021-07-18 (×6): qty 1

## 2021-07-18 MED ORDER — ADULT MULTIVITAMIN W/MINERALS CH
1.0000 | ORAL_TABLET | Freq: Every day | ORAL | Status: DC
  Administered 2021-07-18 – 2021-07-22 (×5): 1 via ORAL
  Filled 2021-07-18 (×5): qty 1

## 2021-07-18 MED ORDER — FENOFIBRATE 54 MG PO TABS
54.0000 mg | ORAL_TABLET | Freq: Every day | ORAL | Status: DC
  Administered 2021-07-18 – 2021-07-22 (×5): 54 mg via ORAL
  Filled 2021-07-18 (×6): qty 1

## 2021-07-18 MED ORDER — ATORVASTATIN CALCIUM 80 MG PO TABS
80.0000 mg | ORAL_TABLET | Freq: Every day | ORAL | Status: DC
  Administered 2021-07-18 – 2021-07-21 (×4): 80 mg via ORAL
  Filled 2021-07-18 (×4): qty 1

## 2021-07-18 MED ORDER — FOLIC ACID 1 MG PO TABS
1.0000 mg | ORAL_TABLET | Freq: Every day | ORAL | Status: DC
  Administered 2021-07-18 – 2021-07-22 (×5): 1 mg via ORAL
  Filled 2021-07-18 (×5): qty 1

## 2021-07-18 MED ORDER — INSULIN ASPART 100 UNIT/ML IJ SOLN
0.0000 [IU] | INTRAMUSCULAR | Status: DC
  Administered 2021-07-18: 3 [IU] via SUBCUTANEOUS
  Administered 2021-07-18: 5 [IU] via SUBCUTANEOUS
  Administered 2021-07-19 – 2021-07-20 (×5): 3 [IU] via SUBCUTANEOUS
  Administered 2021-07-20: 5 [IU] via SUBCUTANEOUS
  Administered 2021-07-20: 2 [IU] via SUBCUTANEOUS
  Administered 2021-07-20 – 2021-07-21 (×2): 5 [IU] via SUBCUTANEOUS
  Administered 2021-07-21 (×2): 3 [IU] via SUBCUTANEOUS
  Administered 2021-07-21 – 2021-07-22 (×2): 2 [IU] via SUBCUTANEOUS

## 2021-07-18 MED ORDER — THIAMINE HCL 100 MG/ML IJ SOLN: 100.0000 mg | Freq: Every day | INTRAMUSCULAR | Status: DC

## 2021-07-18 NOTE — Assessment & Plan Note (Addendum)
Improved

## 2021-07-18 NOTE — Progress Notes (Signed)
?PROGRESS NOTE ? ? ? ?Troy Rush  WRU:[id number] DOB: 11/23/1970 DOA: 07/17/2021 ?PCP: Vevelyn Francois, NP  ? ? ?Brief Narrative:  ?Troy Rush is a 51 y.o. African-American male with medical history significant for CHF, depression, type 2 diabetes mellitus, lower extremity DVT, dyslipidemia, end-stage renal disease not on hemodialysis yet, hypertension, status post AICD, nonischemic cardiomyopathy, pancreatitis and sleep apnea, who presented to the ER with acute onset of intractable nausea and vomiting with substernal chest pain and epigastric abdominal pain.  No diarrhea or melena or bright red blood per rectum.  He denied any radiation of his chest pain.  He admitted to coughing with occasional expectoration of yellowish sputum.  He has been having mild dyspnea without wheezing.  No dysuria, oliguria or hematuria or flank pain.  His BP was significantly elevated in the ER.  He is not on home oxygen and required 2 L of O2 by nasal cannula extremity was down to 64% on room air and later up to 97% on 2 L of O2 by nasal cannula. ?ED Course: When he came to the ER, BP was 221/102 with respiratory rate was normal later 22 with otherwise normal vital signs.  His pulse oximetry was 98% on 2 L O2 by nasal cannula ? Chest CTA of the abdomen and pelvis showed cardiomegaly, pacemaker, some coronary artery calcification and aortic atherosclerosis calcification with no aneurysm or dissection.  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 ? ?4/9 bp very elevated, in 200 sbp. No n/v this am. ? ?Consultants:  ? ? ?Procedures:  ? ?Antimicrobials:  ?  ? ? ?Subjective: ?No cp this am. No sob. Using interpreter for sign language ? ?Objective: ?Vitals:  ? 07/18/21 0424 07/18/21 0500 07/18/21 0846 07/18/21 1137  ?BP: (!) 172/84  (!) 190/99 (!) 182/113  ?Pulse: 86  83 84  ?Resp: 16  15 16   ?Temp:  98.4 ?F (36.9 ?C)  98.4 ?F (36.9 ?C) 98.5 ?F (36.9 ?C)  ?TempSrc: Oral  Oral Oral  ?SpO2: 98%  99% 98%  ?Weight:  109.1 kg    ?Height:      ? ? ?Intake/Output Summary (Last 24 hours) at 07/18/2021 1456 ?Last data filed at 07/18/2021 0540 ?Gross per 24 hour  ?Intake 1575.66 ml  ?Output --  ?Net 1575.66 ml  ? ?Filed Weights  ? 07/18/21 0025 07/18/21 0500  ?Weight: 108.1 kg 109.1 kg  ? ? ?Examination: ?Calm, NAD ?Cta no w/r ?Reg s1/s2 no gallop ?Soft benign +bs ?No edema ?Awake and alert, grossly intact ?Mood and affect appropriate in current setting  ? ? ? ?Data Reviewed: I have personally reviewed following labs and imaging studies ? ?CBC: ?Recent Labs  ?Lab 07/17/21 ?1500 07/18/21 ?0308  ?WBC 7.0 8.3  ?NEUTROABS 5.8  --   ?HGB 12.5* 12.1*  ?HCT 42.3 41.1  ?MCV 94.8 94.9  ?PLT 205 212  ? ?Basic Metabolic Panel: ?Recent Labs  ?Lab 07/17/21 ?1500 07/18/21 ?0308  ?NA 146* 144  ?K 5.1 4.9  ?CL 106 105  ?CO2 25 27  ?GLUCOSE 163* 186*  ?BUN 39* 45*  ?CREATININE 2.12* 2.93*  ?CALCIUM 9.9 9.1  ? ?GFR: ?Estimated Creatinine Clearance: 36.5 mL/min (A) (by C-G formula based on SCr of 2.93 mg/dL (H)). ?Liver Function Tests: ?Recent Labs  ?Lab 07/17/21 ?1500  ?AST 43*  ?ALT 38  ?ALKPHOS 49  ?BILITOT 1.3*  ?PROT  8.6*  ?ALBUMIN 4.2  ? ?Recent Labs  ?Lab 07/17/21 ?1500  ?LIPASE 36  ? ?No results for input(s): AMMONIA in the last 168 hours. ?Coagulation Profile: ?No results for input(s): INR, PROTIME in the last 168 hours. ?Cardiac Enzymes: ?No results for input(s): CKTOTAL, CKMB, CKMBINDEX, TROPONINI in the last 168 hours. ?BNP (last 3 results) ?No results for input(s): PROBNP in the last 8760 hours. ?HbA1C: ?No results for input(s): HGBA1C in the last 72 hours. ?CBG: ?No results for input(s): GLUCAP in the last 168 hours. ?Lipid Profile: ?No results for input(s): CHOL, HDL, LDLCALC, TRIG, CHOLHDL, LDLDIRECT in the last 72 hours. ?Thyroid Function Tests: ?No results for input(s): TSH, T4TOTAL, FREET4, T3FREE, THYROIDAB in the last 72  hours. ?Anemia Panel: ?No results for input(s): VITAMINB12, FOLATE, FERRITIN, TIBC, IRON, RETICCTPCT in the last 72 hours. ?Sepsis Labs: ?No results for input(s): PROCALCITON, LATICACIDVEN in the last 168 hours. ? ?No results found for this or any previous visit (from the past 240 hour(s)).  ? ? ? ? ? ?Radiology Studies: ?DG Chest Portable 1 View ? ?Result Date: 07/17/2021 ?CLINICAL DATA:  Chest pain/central chest pressure since this morning. Vomiting. Alcohol ingestion. EXAM: PORTABLE CHEST 1 VIEW COMPARISON:  Radiographs 04/06/2021 and 03/15/2021.  CT 02/23/2021. FINDINGS: 1514 hours. Stable cardiomegaly and vascular congestion. Left subclavian AICD lead appears unchanged, projecting over the right ventricular apex. There are lower lung volumes without focal airspace disease, pneumothorax or significant pleural effusion. No acute osseous findings are evident. Numerous telemetry leads overlie the chest. IMPRESSION: No evidence of acute cardiopulmonary process. Stable cardiomegaly and mild vascular congestion. Electronically Signed   By: Richardean Sale M.D.   On: 07/17/2021 15:28  ? ?CT Angio Chest/Abd/Pel for Dissection W and/or Wo Contrast ? ?Result Date: 07/17/2021 ?CLINICAL DATA:  Central chest pain and pressure beginning today. EXAM: CT ANGIOGRAPHY CHEST, ABDOMEN AND PELVIS TECHNIQUE: Chest radiography same day. Multidetector CT imaging through the chest, abdomen and pelvis was performed using the standard protocol during bolus administration of intravenous contrast. Multiplanar reconstructed images and MIPs were obtained and reviewed to evaluate the vascular anatomy. RADIATION DOSE REDUCTION: This exam was performed according to the departmental dose-optimization program which includes automated exposure control, adjustment of the mA and/or kV according to patient size and/or use of iterative reconstruction technique. CONTRAST:  147mL OMNIPAQUE IOHEXOL 350 MG/ML SOLN COMPARISON:  Multiple prior CT studies  including 09/30/2020. FINDINGS: CTA CHEST FINDINGS Cardiovascular: Moderate cardiomegaly. Pacemaker in place. No pericardial effusion. Coronary artery calcification and aortic atherosclerotic calcification are present, but there is no aneurysm or dissection. Pulmonary arterial opacification is good. There are no pulmonary emboli. Chronic calcification at the right pulmonary artery bifurcation of the lower and middle lobe vessels as seen previously. Mediastinum/Nodes: No mass or adenopathy. Lungs/Pleura: No pleural effusion. The lungs are clear. No infiltrate, collapse or mass. Musculoskeletal: Mild thoracic degenerative changes. Review of the MIP images confirms the above findings. CTA ABDOMEN AND PELVIS FINDINGS VASCULAR Aorta: Aortic atherosclerosis, mild.  No aneurysm or stenosis. Celiac: Normal SMA: Normal Renals: Single normal renal arteries on each side. IMA: Normal Inflow: Normal Veins: Normal Review of the MIP images confirms the above findings. NON-VASCULAR Hepatobiliary: Liver parenchyma is normal.  No calcified gallstones. Pancreas: Normal Spleen: Normal Adrenals/Urinary Tract: Adrenal glands are normal. Kidneys are normal. No cyst, mass, stone or hydronephrosis. Bladder is normal. Stomach/Bowel: Stomach and small intestine are normal. Normal appendix. Normal colon. Lymphatic: No adenopathy. Reproductive: Normal. Other: No free fluid or air. Musculoskeletal: No significant bone  finding. Review of the MIP images confirms the above findings. IMPRESSION: Cardiomegaly. Pacemaker. Some coronary artery calcification and aortic atherosclerotic calcification. No aneurysm or dissection. Chronic calcified web in the right pulmonary artery as seen previously. No significant abdominal or pelvic vascular finding. No evidence of acute organ pathology. Electronically Signed   By: Nelson Chimes M.D.   On: 07/17/2021 19:33   ? ? ? ? ? ?Scheduled Meds: ? carvedilol  25 mg Oral BID WC  ? enoxaparin (LOVENOX) injection  40 mg  Subcutaneous Q24H  ? hydrALAZINE  50 mg Oral Q8H  ? pantoprazole (PROTONIX) IV  40 mg Intravenous Q12H  ? ?Continuous Infusions: ? sodium chloride 50 mL/hr at 07/18/21 0944  ? ? ?Assessment & Plan: ?  ?[place]

## 2021-07-18 NOTE — Assessment & Plan Note (Addendum)
Breathing symptoms has improved, grossly looks euvolemic. Will need gentle hydration.  ?- His last EF was 35%-40 % on 8/33/3832 and diastolic function could not be evaluated then. ?Cardiology following for med adjustments.  ?

## 2021-07-19 ENCOUNTER — Inpatient Hospital Stay (HOSPITAL_COMMUNITY): Payer: Medicare Other

## 2021-07-19 DIAGNOSIS — R112 Nausea with vomiting, unspecified: Secondary | ICD-10-CM | POA: Diagnosis not present

## 2021-07-19 LAB — RENAL FUNCTION PANEL
Albumin: 3.2 g/dL — ABNORMAL LOW (ref 3.5–5.0)
Anion gap: 8 (ref 5–15)
BUN: 47 mg/dL — ABNORMAL HIGH (ref 6–20)
CO2: 28 mmol/L (ref 22–32)
Calcium: 8.7 mg/dL — ABNORMAL LOW (ref 8.9–10.3)
Chloride: 106 mmol/L (ref 98–111)
Creatinine, Ser: 4.05 mg/dL — ABNORMAL HIGH (ref 0.61–1.24)
GFR, Estimated: 17 mL/min — ABNORMAL LOW (ref >60)
Glucose, Bld: 93 mg/dL (ref 70–99)
Phosphorus: 3.7 mg/dL (ref 2.5–4.6)
Potassium: 4.3 mmol/L (ref 3.5–5.1)
Sodium: 142 mmol/L (ref 135–145)

## 2021-07-19 LAB — GLUCOSE, CAPILLARY
Glucose-Capillary: 101 mg/dL — ABNORMAL HIGH (ref 70–99)
Glucose-Capillary: 167 mg/dL — ABNORMAL HIGH (ref 70–99)
Glucose-Capillary: 168 mg/dL — ABNORMAL HIGH (ref 70–99)
Glucose-Capillary: 185 mg/dL — ABNORMAL HIGH (ref 70–99)
Glucose-Capillary: 187 mg/dL — ABNORMAL HIGH (ref 70–99)
Glucose-Capillary: 91 mg/dL (ref 70–99)

## 2021-07-19 MED ORDER — DM-GUAIFENESIN ER 30-600 MG PO TB12: 1.0000 | ORAL_TABLET | Freq: Two times a day (BID) | ORAL | Status: DC | PRN

## 2021-07-19 MED ORDER — EVOLOCUMAB 140 MG/ML ~~LOC~~ SOAJ: 140.0000 mg | SUBCUTANEOUS | Status: DC

## 2021-07-19 MED ORDER — HYDRALAZINE HCL 50 MG PO TABS: 50.0000 mg | ORAL_TABLET | Freq: Three times a day (TID) | ORAL | Status: DC

## 2021-07-19 MED ORDER — SODIUM CHLORIDE 0.9 % IV SOLN: INTRAVENOUS | Status: DC

## 2021-07-19 MED ORDER — METOPROLOL TARTRATE 5 MG/5ML IV SOLN: 5.0000 mg | INTRAVENOUS | Status: DC | PRN

## 2021-07-19 MED ORDER — SERTRALINE HCL 50 MG PO TABS
50.0000 mg | ORAL_TABLET | Freq: Every day | ORAL | Status: DC
  Administered 2021-07-19 – 2021-07-22 (×4): 50 mg via ORAL
  Filled 2021-07-19 (×4): qty 1

## 2021-07-19 MED ORDER — SENNOSIDES-DOCUSATE SODIUM 8.6-50 MG PO TABS
1.0000 | ORAL_TABLET | Freq: Every evening | ORAL | Status: DC | PRN
Start: 2021-07-19 — End: 2021-07-22

## 2021-07-19 MED ORDER — INSULIN GLARGINE-YFGN 100 UNIT/ML ~~LOC~~ SOLN
10.0000 [IU] | Freq: Every day | SUBCUTANEOUS | Status: DC
  Administered 2021-07-19 – 2021-07-21 (×3): 10 [IU] via SUBCUTANEOUS
  Filled 2021-07-19 (×4): qty 0.1

## 2021-07-19 MED ORDER — CARVEDILOL 25 MG PO TABS: 25.0000 mg | ORAL_TABLET | Freq: Two times a day (BID) | ORAL | Status: DC

## 2021-07-19 MED ORDER — PANTOPRAZOLE SODIUM 40 MG PO TBEC: 40.0000 mg | DELAYED_RELEASE_TABLET | Freq: Two times a day (BID) | ORAL | Status: DC

## 2021-07-19 MED ORDER — APIXABAN 5 MG PO TABS
5.0000 mg | ORAL_TABLET | Freq: Two times a day (BID) | ORAL | Status: DC
  Administered 2021-07-19 – 2021-07-22 (×7): 5 mg via ORAL
  Filled 2021-07-19 (×7): qty 1

## 2021-07-19 NOTE — Assessment & Plan Note (Addendum)
Cr 4.05 (baseline 2.8) > 4.38 ?Bladder scan 10cc.  Renal US- neg.  ?Will continue gentle hydration for another 24 hrs.  ?

## 2021-07-19 NOTE — Progress Notes (Signed)
Heart Failure Navigator Progress Note ? ?Assessed for Heart & Vascular TOC clinic readiness.  ?Patient does not meet criteria due to patient seen with Advance Heart Failure Team..  ? ? ? ?Earnestine Leys, BSN, RN ?Heart Failure Nurse Navigator ?Secure Chat Only   ?

## 2021-07-19 NOTE — Progress Notes (Signed)
?  Transition of Care (TOC) Screening Note ? ? ?Patient Details  ?Name: Troy Rush ?Date of Birth: 09-24-1970 ? ? ?Transition of Care (TOC) CM/SW Contact:    ?Cyndi Bender, RN ?Phone Number: ?07/19/2021, 9:31 AM ? ? ? ?Transition of Care Department [facility]) has reviewed patient We will continue to monitor patient advancement through interdisciplinary progression rounds. Substance abuse counseling consult. ?

## 2021-07-19 NOTE — Consult Note (Addendum)
?Cardiology Consultation:  ? ?Patient ID: Troy Rush ?MRN: [id number]; DOB: 11/24/1970 ? ?Admit date: 07/17/2021 ?Date of Consult: 07/19/2021 ? ?PCP:  Vevelyn Francois, NP ?  ?[facility] Providers ?Cardiologist:  Loralie Champagne, MD  ?Cardiology APP:  [employer], NP  { ? ? ?Patient Profile:  ? ?Troy Rush is a 51 y.o. male with a hx of deafness, CKD stage 3, hyperlipidemia, diabetes, hypertension, nonischemic cardiomyopathy, OSA on CPAP, Afib, alcohol abuse who is being seen 07/19/2021 for the evaluation of CHF and chest pain at the request of Dr. Reesa Chew. ? ?History of Present Illness:  ? ?Mr. Whistler is followed by the advanced heart failure clinic for the above cardiac issues.  ? ?Patient started to develop cardiac problems in October 2016.  At that time, he developed exertional dyspnea and was admitted to the hospital in [place] with acute systolic heart failure.  Echo showed EF of 15 to 20%.  Cardiac cath showed no significant coronary artery disease.  He was diuresed and started on cardiac meds.  Subsequently, he moved to [place].  Repeat echo in 08/2016 showed persistently low EF 25 to 30%.  He had a [place] placed. ? ?Echo 920 showed EF 30 to 35% with diffuse hypokinesis.  Ultrasound in 07/30/2019 showed right leg DVT and CT showed a PE.  No obvious trigger and he was started on Eliquis. ? ?He quit taking his meds in August 2021 and was drinking heavily due to depression, later restarted medications.  In October 2021, he was admitted with DKA and A-fib RVR.  He converted to normal sinus rhythm spontaneously.  [name] was stopped due to AKI.  He had melena and hematochezia and EGD showed gastritis/esophagitis.  Eliquis was stopped.  He went home but then was readmitted later in October 2021 for bilateral PEs.  Echo at that time showed EF 45%, mild global hypokinesis moderate LVH, mildly decreased RV systolic function. ? ?In May 05, 2020 he had a near syncopal event and went to the ER, no  events on ICD interrogated.  Possibly orthostatic. ? ?Repeat right heart cath 05/31/2020 showed optimize filling pressures, mild pulmonary hypertension, preserved cardiac output.  Suspected elevated PA pressure may be related to OSA. ? ?Admitted 06/28/2020 with acute on chronic systolic heart failure and he was diuresed with IV Lasix and transition to Lasix 60 mg daily.  His discharge weight was 232 pounds ? ?On 09/10/2020 he presented for scheduled CardioMEMS and was noted to be in A-fib RVR.  Right heart cath with successful CardioMEMS implant.  Stable hemodynamically on cath.  Admitted from Cath Lab with A-fib RVR and started on amnio drip.  He chemically reverted to normal sinus rhythm and was transitioned to Amio 200 mg twice daily.  Also incidentally found to be COVID-positive. ? ?On 09/30/2020 he was admitted with acute subcortical CVA.  Unable to get MRI given ICD.  Neurology felt it was related to small vessel disease.  Eliquis was continued.  He was discharged to [facility] for rehab.  Echo showed EF of 35 to 40% ? ?The patient underwent A-fib ablation 1122. ? ?Patient was seen 05/25/2021 in the heart failure clinic and was overall doing well.  CardioMEMS was 22, at goal.  He was in sinus rhythm. ? ?Patient saw EP 06/08/2021 and since patient was status post A-fib with no recurrence amiodarone was stopped. ? ?The patient presented to the ER for 823 for chest pain with nausea and intractable vomiting.  He  denied melena or bright red blood per rectum.  Denied radiation of the chest pain.  He has occasional dyspnea on exertion. ? ?In the ER, blood pressure was 221/102 with a respiratory rate up to 22.  Pulse ox was 98 on 2 L O2 by nasal cannula.  Labs showed sodium 146, glucose 163, creatinine 2.12, BUN 39.  Total bili was 1.3, albumin 4.2.  BNP 524.  High-sensitivity troponin was 30> 40.  CBC showed hemoglobin 12.5 and hematocrit 42.3. EKG showed NSR with heart rate of 81 with qtc 562ms with no ischemic changes. CXR with  mild vascular congestion. Chest CTA showed coronary artery calcification and aortic atherosclerosis with no aneurysm or dissection.  ?Patient was given 1 mg of IV Dilaudid, 10 mg of IV hydralazine, 10 mg IV labetalol, IV Compazine, IV Benadryl and IV Protonix, along with normal saline.  He was admitted for further work-up. ? ? ?Past Medical History:  ?Diagnosis Date  ? AICD (automatic cardioverter/defibrillator) present 10/14/2016  ? Bilateral deafness   ? Bronchitis   ? CHF (congestive heart failure) ([place])   ? Depression   ? Diabetes mellitus without complication ([place])   ? DVT, lower extremity ([place]) 07/12/2019  ? Dyslipidemia   ? ESRD (end stage renal disease) ([place])   ? Reports stage IV CKD, Has not started dialysis d/t inability to communicate w/ nephrologist on his first appointment  ? Headache 08/2019  ? Hypertension   ? ICD (implantable cardioverter-defibrillator) in place   ? Insomnia 08/2019  ? NICM (nonischemic cardiomyopathy) ([place]) 09/15/2016  ? Pancreatitis   ? Renal disorder   ? Sleep apnea   ? Vitamin D deficiency 08/2019  ? ? ?Past Surgical History:  ?Procedure Laterality Date  ? ATRIAL FIBRILLATION ABLATION N/A 03/02/2021  ? Procedure: ATRIAL FIBRILLATION ABLATION;  Surgeon: Constance Haw, MD;  Location: [facility];  Service: Cardiovascular;  Laterality: N/A;  ? BACK SURGERY    ? CARDIAC CATHETERIZATION    ? ESOPHAGOGASTRODUODENOSCOPY (EGD) WITH PROPOFOL N/A 02/02/2020  ? Procedure: ESOPHAGOGASTRODUODENOSCOPY (EGD) WITH PROPOFOL;  Surgeon: Arta Silence, MD;  Location: [facility];  Service: Endoscopy;  Laterality: N/A;  ? ICD IMPLANT  10/14/2016  ? ICD IMPLANT N/A 10/14/2016  ? Procedure: ICD Implant;  Surgeon: Deboraha Sprang, MD;  Location: [facility];  Service: Cardiovascular;  Laterality: N/A;  ? PRESSURE SENSOR/CARDIOMEMS N/A 09/10/2020  ? Procedure: PRESSURE SENSOR/CARDIOMEMS;  Surgeon: Larey Dresser, MD;  Location: [facility];  Service: Cardiovascular;   Laterality: N/A;  ? RIGHT HEART CATH N/A 05/21/2020  ? Procedure: RIGHT HEART CATH;  Surgeon: Larey Dresser, MD;  Location: [facility];  Service: Cardiovascular;  Laterality: N/A;  ? WRIST SURGERY    ?  ? ?Home Medications:  ?Prior to Admission medications   ?Medication Sig Start Date End Date Taking? Authorizing Provider  ?acetaminophen (TYLENOL) 325 MG tablet Take 2 tablets (650 mg total) by mouth every 4 (four) hours as needed for mild pain (or temp > 37.5 C (99.5 F)). 10/07/20  Yes Angiulli, Lavon Paganini, PA-C  ?apixaban (ELIQUIS) 5 MG TABS tablet TAKE 1 TABLET(5 MG) BY MOUTH TWICE DAILY ?Patient taking differently: Take 5 mg by mouth 2 (two) times daily. 10/07/20  Yes Angiulli, Lavon Paganini, PA-C  ?atorvastatin (LIPITOR) 80 MG tablet Take 1 tablet (80 mg total) by mouth at bedtime. 10/07/20  Yes Angiulli, Lavon Paganini, PA-C  ?carvedilol (COREG) 25 MG tablet TAKE 1 TABLET (25 MG TOTAL) BY  MOUTH 2 (TWO) TIMES DAILY. 01/04/21 01/04/22 Yes Consuelo Pandy, PA-C  ?Cholecalciferol (D3-1000 PO) Take 1,000 Units by mouth daily.   Yes [provider]  ?dapagliflozin propanediol (FARXIGA) 10 MG TABS tablet Take 1 tablet (10 mg total) by mouth daily before breakfast. 10/27/20  Yes Consuelo Pandy, PA-C  ?Evolocumab (REPATHA SURECLICK) 097 MG/ML SOAJ Inject 140 mg into the skin every 14 (fourteen) days. 12/29/20  Yes Larey Dresser, MD  ?fenofibrate 54 MG tablet Take 1 tablet (54 mg total) by mouth daily. 07/07/21  Yes Larey Dresser, MD  ?furosemide (LASIX) 80 MG tablet Take 1 tablet (80 mg total) by mouth 2 (two) times daily. 05/18/21  Yes Larey Dresser, MD  ?hydrALAZINE (APRESOLINE) 50 MG tablet Take 1 tablet (50 mg total) by mouth 3 (three) times daily. 07/07/21  Yes Larey Dresser, MD  ?insulin glargine (LANTUS SOLOSTAR) 100 UNIT/ML Solostar Pen Inject 35 Units into the skin at bedtime. 01/15/21 01/15/22 Yes Vevelyn Francois, NP  ?isosorbide mononitrate (IMDUR) 60 MG 24 hr tablet Take 1 tablet (60 mg  total) by mouth daily. 10/07/20  Yes Angiulli, Lavon Paganini, PA-C  ?latanoprost (XALATAN) 0.005 % ophthalmic solution Place 1 drop into both eyes at bedtime. 01/20/20  Yes [provider]  ?metolazone (ZAROXO

## 2021-07-19 NOTE — Progress Notes (Signed)
?PROGRESS NOTE ? ? ? ?Troy Rush  AQT:[id number] DOB: [birth year] DOA: 07/17/2021 ?PCP: Vevelyn Francois, NP  ? ?Brief Narrative:  ?51 y.o. African-American male with medical history significant for CHF, depression, type 2 diabetes mellitus, lower extremity DVT, dyslipidemia, end-stage renal disease not on hemodialysis yet, hypertension, status post AICD, nonischemic cardiomyopathy, pancreatitis and sleep apnea, who presented to the ER with acute onset of intractable nausea and vomiting with substernal chest pain and epigastric abdominal pain.  He was diagnosed with CHF exacerbation requiring diuretics. ? ? ?Assessment & Plan: ? Principal Problem: ?  Intractable nausea and vomiting ?Active Problems: ?  AKI (acute kidney injury) ([place]) ?  Acute on chronic systolic CHF (congestive heart failure) ([place]) ?  Acute respiratory failure with hypoxia ([place]) ?  Chest pain ?  Hypertensive urgency ?  Chronic kidney disease, stage 3b ([place]) ?  Dyslipidemia ?  Type 2 diabetes mellitus with chronic kidney disease, with long-term current use of insulin ([place]) ?  Depression ?  Paroxysmal atrial fibrillation (HCC) ?  Alcohol abuse ?  ? ? ?Assessment and Plan: ?* Intractable nausea and vomiting ?Resolved.  ? ?Acute respiratory failure with hypoxia ([place]) ?Resolved feeling better.  ? ?Acute on chronic systolic CHF (congestive heart failure) ([place]) ?Did better with diuretics but with rise in Cr, will hold off on nephrotoxic drugs.  ?- His last EF was 35%-40 % on 5/62/5638 and diastolic function could not be evaluated then. ?Consult Cardiology.  ? ?AKI (acute kidney injury) ([place]) ?Cr 4.05 (baseline 2.8) ?Bladder scan 10cc. Will order renal US.  ?Hold off on nephrotoxic drugs and Diuretics. ? ?Chest pain ?Resolved.  ? ?Hypertensive urgency ?Resume home meds once confirmed.  ?IV prn has been ordered.  ? ? ?Type 2 diabetes mellitus with chronic kidney disease, with long-term current use of insulin ([place]) ?- The patient will be placed on supplement  coverage with NovoLog. ?- We will continue his basal coverage at lower rate ?Hold farxiga.  ? ?Dyslipidemia ?- We will continue statin therapy and fenofibrate.  The patient is also on Repatha. ? ?Chronic kidney disease, stage 3b ([place]) ?Baseline Cr 2.8. But went up to 4.05 this morning ? ?Paroxysmal atrial fibrillation (HCC) ?On Eliquis ? ?Depression ?continue Zoloft. ? ?Alcohol abuse ?Monitor for withdrawals ? ? ? ? ?  ? ? ? ?DVT prophylaxis: Eliquis ?Code Status:  ?Family Communication:   ? ?Status is: Inpatient ?Remains inpatient appropriate because: Maintain hospital stay for cardiology evaluation.  Ongoing work-up for acute kidney injury and blood pressure management. ? ? ?Nutritional status ? ? ? ?  ? ?  ? ?Body mass index is 34.64 kg/m?. ? ?  ? ? ? ? ? ?Subjective: ?Spoke with the patient using interrogation sign language service. ID [id number] ([place]) ? ?Patient states his chest pain and shortness of breath is significantly improved.  He actually wants to go home but he understands due to his renal function he needs to stay in the hospital today. ? ? ?Examination: ? ?General exam: Appears calm and comfortable  ?Respiratory system: Clear to auscultation. Respiratory effort normal. ?Cardiovascular system: S1 & S2 heard, RRR. No JVD, murmurs, rubs, gallops or clicks. No pedal edema. ?Gastrointestinal system: Abdomen is nondistended, soft and nontender. No organomegaly or masses felt. Normal bowel sounds heard. ?Central nervous system: Alert and oriented. No focal neurological deficits. ?Extremities: Symmetric 5 x 5 power. ?Skin: No rashes, lesions or ulcers ?Psychiatry: Judgement and insight appear normal. Mood & affect appropriate.  ? ? ? ?Objective: ?  Vitals:  ? 07/19/21 0337 07/19/21 0500 07/19/21 0759 07/19/21 1148  ?BP: (!) 168/106 (!) 142/72 (!) 172/82 (!) 148/78  ?Pulse:  77 79 79  ?Resp:  13 19 14   ?Temp:  98 ?F (36.7 ?C) 98 ?F (36.7 ?C) 98.6 ?F (37 ?C)  ?TempSrc:  Oral Oral Oral  ?SpO2:  97% 97% 96%   ?Weight:  109.5 kg    ?Height:      ? ?No intake or output data in the 24 hours ending 07/19/21 1202 ?Filed Weights  ? 07/18/21 0025 07/18/21 0500 07/19/21 0500  ?Weight: 108.1 kg 109.1 kg 109.5 kg  ? ? ? ?Data Reviewed:  ? ?CBC: ?Recent Labs  ?Lab 07/17/21 ?1500 07/18/21 ?0308  ?WBC 7.0 8.3  ?NEUTROABS 5.8  --   ?HGB 12.5* 12.1*  ?HCT 42.3 41.1  ?MCV 94.8 94.9  ?PLT 205 212  ? ?Basic Metabolic Panel: ?Recent Labs  ?Lab 07/17/21 ?1500 07/18/21 ?0308 07/19/21 ?0208  ?NA 146* 144 142  ?K 5.1 4.9 4.3  ?CL 106 105 106  ?CO2 25 27 28   ?GLUCOSE 163* 186* 93  ?BUN 39* 45* 47*  ?CREATININE 2.12* 2.93* 4.05*  ?CALCIUM 9.9 9.1 8.7*  ?PHOS  --   --  3.7  ? ?GFR: ?Estimated Creatinine Clearance: 26.4 mL/min (A) (by C-G formula based on SCr of 4.05 mg/dL (H)). ?Liver Function Tests: ?Recent Labs  ?Lab 07/17/21 ?1500 07/19/21 ?0208  ?AST 43*  --   ?ALT 38  --   ?ALKPHOS 49  --   ?BILITOT 1.3*  --   ?PROT 8.6*  --   ?ALBUMIN 4.2 3.2*  ? ?Recent Labs  ?Lab 07/17/21 ?1500  ?LIPASE 36  ? ?No results for input(s): AMMONIA in the last 168 hours. ?Coagulation Profile: ?No results for input(s): INR, PROTIME in the last 168 hours. ?Cardiac Enzymes: ?No results for input(s): CKTOTAL, CKMB, CKMBINDEX, TROPONINI in the last 168 hours. ?BNP (last 3 results) ?No results for input(s): PROBNP in the last 8760 hours. ?HbA1C: ?Recent Labs  ?  07/18/21 ?1525  ?HGBA1C 7.3*  ? ?CBG: ?Recent Labs  ?Lab 07/18/21 ?2001 07/19/21 ?0009 07/19/21 ?9323 07/19/21 ?0758 07/19/21 ?1141  ?GLUCAP 224* 91 101* 168* 167*  ? ?Lipid Profile: ?No results for input(s): CHOL, HDL, LDLCALC, TRIG, CHOLHDL, LDLDIRECT in the last 72 hours. ?Thyroid Function Tests: ?No results for input(s): TSH, T4TOTAL, FREET4, T3FREE, THYROIDAB in the last 72 hours. ?Anemia Panel: ?No results for input(s): VITAMINB12, FOLATE, FERRITIN, TIBC, IRON, RETICCTPCT in the last 72 hours. ?Sepsis Labs: ?No results for input(s): PROCALCITON, LATICACIDVEN in the last 168 hours. ? ?No results found  for this or any previous visit (from the past 240 hour(s)).  ? ? ? ? ? ?Radiology Studies: ?DG Chest Portable 1 View ? ?Result Date: 07/17/2021 ?CLINICAL DATA:  Chest pain/central chest pressure since this morning. Vomiting. Alcohol ingestion. EXAM: PORTABLE CHEST 1 VIEW COMPARISON:  Radiographs 04/06/2021 and 03/15/2021.  CT 02/23/2021. FINDINGS: 1514 hours. Stable cardiomegaly and vascular congestion. Left subclavian AICD lead appears unchanged, projecting over the right ventricular apex. There are lower lung volumes without focal airspace disease, pneumothorax or significant pleural effusion. No acute osseous findings are evident. Numerous telemetry leads overlie the chest. IMPRESSION: No evidence of acute cardiopulmonary process. Stable cardiomegaly and mild vascular congestion. Electronically Signed   By: Richardean Sale M.D.   On: 07/17/2021 15:28  ? ?CT Angio Chest/Abd/Pel for Dissection W and/or Wo Contrast ? ?Result Date: 07/17/2021 ?CLINICAL DATA:  Central chest pain and pressure beginning  today. EXAM: CT ANGIOGRAPHY CHEST, ABDOMEN AND PELVIS TECHNIQUE: Chest radiography same day. Multidetector CT imaging through the chest, abdomen and pelvis was performed using the standard protocol during bolus administration of intravenous contrast. Multiplanar reconstructed images and MIPs were obtained and reviewed to evaluate the vascular anatomy. RADIATION DOSE REDUCTION: This exam was performed according to the departmental dose-optimization program which includes automated exposure control, adjustment of the mA and/or kV according to patient size and/or use of iterative reconstruction technique. CONTRAST:  145mL OMNIPAQUE IOHEXOL 350 MG/ML SOLN COMPARISON:  Multiple prior CT studies including 09/30/2020. FINDINGS: CTA CHEST FINDINGS Cardiovascular: Moderate cardiomegaly. Pacemaker in place. No pericardial effusion. Coronary artery calcification and aortic atherosclerotic calcification are present, but there is no  aneurysm or dissection. Pulmonary arterial opacification is good. There are no pulmonary emboli. Chronic calcification at the right pulmonary artery bifurcation of the lower and middle lobe vessels as seen previousl

## 2021-07-20 DIAGNOSIS — R112 Nausea with vomiting, unspecified: Secondary | ICD-10-CM | POA: Diagnosis not present

## 2021-07-20 LAB — BASIC METABOLIC PANEL
Anion gap: 8 (ref 5–15)
BUN: 48 mg/dL — ABNORMAL HIGH (ref 6–20)
CO2: 25 mmol/L (ref 22–32)
Calcium: 8.7 mg/dL — ABNORMAL LOW (ref 8.9–10.3)
Chloride: 105 mmol/L (ref 98–111)
Creatinine, Ser: 4.38 mg/dL — ABNORMAL HIGH (ref 0.61–1.24)
GFR, Estimated: 15 mL/min — ABNORMAL LOW (ref >60)
Glucose, Bld: 89 mg/dL (ref 70–99)
Potassium: 4 mmol/L (ref 3.5–5.1)
Sodium: 138 mmol/L (ref 135–145)

## 2021-07-20 LAB — CBC
HCT: 33.3 % — ABNORMAL LOW (ref 39.0–52.0)
Hemoglobin: 10 g/dL — ABNORMAL LOW (ref 13.0–17.0)
MCH: 27.9 pg (ref 26.0–34.0)
MCHC: 30 g/dL (ref 30.0–36.0)
MCV: 93 fL (ref 80.0–100.0)
Platelets: 165 10*3/uL (ref 150–400)
RBC: 3.58 MIL/uL — ABNORMAL LOW (ref 4.22–5.81)
RDW: 13.2 % (ref 11.5–15.5)
WBC: 4.2 10*3/uL (ref 4.0–10.5)
nRBC: 0 % (ref 0.0–0.2)

## 2021-07-20 LAB — MAGNESIUM: Magnesium: 2.1 mg/dL (ref 1.7–2.4)

## 2021-07-20 LAB — GLUCOSE, CAPILLARY
Glucose-Capillary: 102 mg/dL — ABNORMAL HIGH (ref 70–99)
Glucose-Capillary: 116 mg/dL — ABNORMAL HIGH (ref 70–99)
Glucose-Capillary: 126 mg/dL — ABNORMAL HIGH (ref 70–99)
Glucose-Capillary: 180 mg/dL — ABNORMAL HIGH (ref 70–99)
Glucose-Capillary: 201 mg/dL — ABNORMAL HIGH (ref 70–99)
Glucose-Capillary: 232 mg/dL — ABNORMAL HIGH (ref 70–99)
Glucose-Capillary: 97 mg/dL (ref 70–99)

## 2021-07-20 MED ORDER — LABETALOL HCL 5 MG/ML IV SOLN
10.0000 mg | INTRAVENOUS | Status: AC | PRN
  Administered 2021-07-20: 10 mg via INTRAVENOUS
  Filled 2021-07-20: qty 4

## 2021-07-20 MED ORDER — HYDRALAZINE HCL 20 MG/ML IJ SOLN
10.0000 mg | INTRAMUSCULAR | Status: DC | PRN
  Administered 2021-07-20 – 2021-07-21 (×3): 10 mg via INTRAVENOUS
  Filled 2021-07-20 (×3): qty 1

## 2021-07-20 MED ORDER — HYDRALAZINE HCL 50 MG PO TABS
75.0000 mg | ORAL_TABLET | Freq: Three times a day (TID) | ORAL | Status: DC
  Administered 2021-07-20 – 2021-07-21 (×3): 75 mg via ORAL
  Filled 2021-07-20 (×3): qty 1

## 2021-07-20 MED ORDER — SODIUM CHLORIDE 0.9 % IV SOLN: INTRAVENOUS | Status: AC

## 2021-07-20 NOTE — Progress Notes (Signed)
? ?Progress Note ? ?Patient Name: Troy Rush ?Date of Encounter: 07/20/2021 ? ?[facility] Cardiologist: Loralie Champagne, MD  ? ?Subjective  ? ?Pt denies CP   No SOB   had a headache last night  Didn't sleep well  ? ?Inpatient Medications  ?  ?Scheduled Meds: ? apixaban  5 mg Oral BID  ? atorvastatin  80 mg Oral QHS  ? carvedilol  25 mg Oral BID WC  ? fenofibrate  54 mg Oral Daily  ? folic acid  1 mg Oral Daily  ? hydrALAZINE  50 mg Oral Q8H  ? insulin aspart  0-15 Units Subcutaneous Q4H  ? insulin glargine-yfgn  10 Units Subcutaneous QHS  ? isosorbide mononitrate  60 mg Oral Daily  ? multivitamin with minerals  1 tablet Oral Daily  ? pantoprazole (PROTONIX) IV  40 mg Intravenous Q12H  ? sertraline  50 mg Oral Daily  ? thiamine  100 mg Oral Daily  ? Or  ? thiamine  100 mg Intravenous Daily  ? ?Continuous Infusions: ? sodium chloride 50 mL/hr at 07/20/21 0853  ? ?PRN Meds: ?acetaminophen **OR** acetaminophen, dextromethorphan-guaiFENesin, hydrALAZINE, LORazepam **OR** LORazepam, magnesium hydroxide, ondansetron **OR** ondansetron (ZOFRAN) IV, senna-docusate, traZODone  ? ?Vital Signs  ?  ?Vitals:  ? 07/20/21 0527 07/20/21 0998 07/20/21 0800 07/20/21 1156  ?BP: (!) 190/97 (!) 181/92 (!) 184/99 (!) 158/80  ?Pulse: 78 75 75 74  ?Resp: 15 16 18 14   ?Temp:   99 ?F (37.2 ?C) 98.7 ?F (37.1 ?C)  ?TempSrc:   Oral Oral  ?SpO2: 98% 95% 94% 98%  ?Weight:      ?Height:      ? ? ?Intake/Output Summary (Last 24 hours) at 07/20/2021 1221 ?Last data filed at 07/20/2021 1153 ?Gross per 24 hour  ?Intake 240 ml  ?Output 325 ml  ?Net -85 ml  ? ?+1490 cc ?   ? ?  07/20/2021  ?  4:19 AM 07/19/2021  ?  5:00 AM 07/18/2021  ?  5:00 AM  ?Last 3 Weights  ?Weight (lbs) 250 lb 10.6 oz 241 lb 6.5 oz 240 lb 8.4 oz  ?Weight (kg) 113.7 kg 109.5 kg 109.1 kg  ?   ? ?Telemetry  ?  ?SR- Personally Reviewed ? ?ECG  ?  ? - Personally Reviewed ? ?Physical Exam  ? ?GEN: No acute distress.   ?Neck: No JVD ?Cardiac: RRR, no murmurs, rubs, or gallops.   ?Respiratory: Clear to auscultation bilaterally. ?GI: Soft, nontender, non-distended  ?MS: No edema; No deformity. ?Neuro:  Nonfocal  ?Psych: Normal affect  ? ?Labs  ?  ?High Sensitivity Troponin:   ?Recent Labs  ?Lab 07/17/21 ?1500 07/17/21 ?1730  ?TROPONINIHS 30* 40*  ?   ?Chemistry ?Recent Labs  ?Lab 07/17/21 ?1500 07/18/21 ?0308 07/19/21 ?0208 07/20/21 ?0050  ?NA 146* 144 142 138  ?K 5.1 4.9 4.3 4.0  ?CL 106 105 106 105  ?CO2 25 27 28 25   ?GLUCOSE 163* 186* 93 89  ?BUN 39* 45* 47* 48*  ?CREATININE 2.12* 2.93* 4.05* 4.38*  ?CALCIUM 9.9 9.1 8.7* 8.7*  ?MG  --   --   --  2.1  ?PROT 8.6*  --   --   --   ?ALBUMIN 4.2  --  3.2*  --   ?AST 43*  --   --   --   ?ALT 38  --   --   --   ?ALKPHOS 49  --   --   --   ?BILITOT 1.3*  --   --   --   ?  GFRNONAA 37* 25* 17* 15*  ?ANIONGAP 15 12 8 8   ?  ?Lipids No results for input(s): CHOL, TRIG, HDL, LABVLDL, LDLCALC, CHOLHDL in the last 168 hours.  ?Hematology ?Recent Labs  ?Lab 07/17/21 ?1500 07/18/21 ?0308 07/20/21 ?0050  ?WBC 7.0 8.3 4.2  ?RBC 4.46 4.33 3.58*  ?HGB 12.5* 12.1* 10.0*  ?HCT 42.3 41.1 33.3*  ?MCV 94.8 94.9 93.0  ?MCH 28.0 27.9 27.9  ?MCHC 29.6* 29.4* 30.0  ?RDW 13.6 13.7 13.2  ?PLT 205 212 165  ? ?Thyroid No results for input(s): TSH, FREET4 in the last 168 hours.  ?BNP ?Recent Labs  ?Lab 07/17/21 ?1500  ?BNP 525.4*  ?  ?DDimer No results for input(s): DDIMER in the last 168 hours.  ? ?Radiology  ?  ?US RENAL ? ?Result Date: 07/19/2021 ?CLINICAL DATA:  Acute kidney injury. EXAM: RENAL / URINARY TRACT ULTRASOUND COMPLETE COMPARISON:  February 01, 2020 FINDINGS: Right Kidney: Renal measurements: 12.7 cm x 5.4 cm x 5.8 cm = volume: 208.2 mL. Echogenicity within normal limits. No mass or hydronephrosis visualized. Left Kidney: Renal measurements: 12.9 cm x 5.8 cm x 4.9 cm = volume: 170.9 mL. Echogenicity within normal limits. No mass or hydronephrosis visualized. Bladder: Appears normal for degree of bladder distention. Other: None. IMPRESSION: Normal renal  ultrasound. Electronically Signed   By: Virgina Norfolk M.D.   On: 07/19/2021 21:55   ? ?Cardiac Studies  ? ?Echo limited 09/30/20 ?1. Left ventricular ejection fraction, by estimation, is 35 to 40%. The  ?left ventricle has moderately decreased function. The left ventricle  ?demonstrates global hypokinesis. Left ventricular diastolic function could  ?not be evaluated.  ? 2. Right ventricular systolic function is normal. The right ventricular  ?size is normal. There is normal pulmonary artery systolic pressure.  ? 3. The mitral valve is normal in structure. No evidence of mitral valve  ?regurgitation. No evidence of mitral stenosis.  ? 4. The aortic valve is tricuspid. Aortic valve regurgitation is not  ?visualized. No aortic stenosis is present.  ? 5. The inferior vena cava is dilated in size with <50% respiratory  ?variability, suggesting right atrial pressure of 15 mmHg.  ?  ?  ?[place] 09/10/20 ?[facility] Procedural Findings: ?Hemodynamics (mmHg) ?RA mean 8 ?RV 30/9 ?PA 37/14, mean 25 ?PCWP mean 9 ? ?Oxygen saturations: ?PA 53% ?AO 100% ? ?Cardiac Output (Fick) 4.1  ?Cardiac Index (Fick) 1.93 ? ?Cardiac Output (Thermo) 5.75 ?Cardiac Index (Thermo) 2.7  ?PVR 2.8 WU ?Estimated blood loss <50 mL.  ? ?During this procedure medications were administered to achieve and maintain moderate conscious sedation while the patient's heart rate, blood pressure, and oxygen saturation were continuously monitored and I was present face-to-face 100% of this time. ?  ?[place] 05/21/20 ?1. Optimized filling pressures.  ?2. Mild pulmonary hypertension.  ?3. Preserved cardiac output.  ?  ?Continue current diuretics.  Suspect elevated PA pressure may be related to OSA.  Needs sleep study.  ?  ?Echo 01/2020 ? 1. Left ventricular ejection fraction, by estimation, is 45%. The left  ?ventricle has mildly decreased function. The left ventricle demonstrates  ?global hypokinesis. There is moderate left ventricular hypertrophy. Left  ?ventricular diastolic  parameters are  ? consistent with Grade I diastolic dysfunction (impaired relaxation).  ? 2. Right ventricular systolic function is mildly reduced. The right  ?ventricular size is normal. Tricuspid regurgitation signal is inadequate  ?for assessing PA pressure.  ? 3. The mitral valve is normal in structure. Trivial mitral valve  ?regurgitation. No evidence  of mitral stenosis.  ? 4. The aortic valve is tricuspid. Aortic valve regurgitation is not  ?visualized. No aortic stenosis is present.  ?  ?  ?[place] 01/04/19 ? 1. Left ventricular ejection fraction, by visual estimation, is 30 to  ?35%. The left ventricle has moderately decreased function. Mildly  ?increased left ventricular size. There is mildly increased left  ?ventricular hypertrophy. Diffuse hypokinesis.  ? 2. Left ventricular diastolic Doppler parameters are consistent with  ?pseudonormalization pattern of LV diastolic filling.  ? 3. The mitral valve is normal in structure. Mild mitral valve  ?regurgitation. No evidence of mitral stenosis.  ? 4. The tricuspid valve is normal in structure. Tricuspid valve  ?regurgitation is trivial.  ? 5. The aortic valve is tricuspid Aortic valve regurgitation is trivial by  ?color flow Doppler. Structurally normal aortic valve, with no evidence of  ?sclerosis or stenosis.  ? 6. There is mild dilatation of the ascending aorta measuring 40 mm.  ? 7. Right atrial size was normal.  ? 8. Left atrial size was mild-moderately dilated.  ? 9. Global right ventricle has normal systolic function.The right  ?ventricular size is normal. No increase in right ventricular wall  ?thickness.  ?10. The inferior vena cava is normal in size with greater than 50%  ?respiratory variability, suggesting right atrial pressure of 3 mmHg.  ?11. The tricuspid regurgitant velocity is 3.61 m/s, and with an assumed  ?right atrial pressure of 3 mmHg, the estimated right ventricular systolic  ?pressure is moderately elevated at 55.1 mmHg.  ?  ? ?Patient  Profile  ? Troy Rush is a 51 y.o. male with a hx of deafness, CKD stage 3, hyperlipidemia, diabetes, hypertension, nonischemic cardiomyopathy, OSA on CPAP, Afib, alcohol abuse who is being seen 07/19/2021 for the evaluation of

## 2021-07-20 NOTE — Progress Notes (Signed)
?PROGRESS NOTE ? ? ? ?Troy Rush  KCM:[id number] DOB: 1970/10/10 DOA: 07/17/2021 ?PCP: Vevelyn Francois, NP  ? ?Brief Narrative:  ?51 y.o. African-American male with medical history significant for CHF, depression, type 2 diabetes mellitus, lower extremity DVT, dyslipidemia, end-stage renal disease not on hemodialysis yet, hypertension, status post AICD, nonischemic cardiomyopathy, pancreatitis and sleep apnea, who presented to the ER with acute onset of intractable nausea and vomiting with substernal chest pain and epigastric abdominal pain.  He was diagnosed with CHF exacerbation requiring diuretics.It was complicated by AKI.  ? ? ?Assessment & Plan: ? Principal Problem: ?  Intractable nausea and vomiting ?Active Problems: ?  AKI (acute kidney injury) ([place]) ?  Acute on chronic systolic CHF (congestive heart failure) ([place]) ?  Acute respiratory failure with hypoxia ([place]) ?  Chest pain ?  Hypertensive urgency ?  Chronic kidney disease, stage 3b ([place]) ?  Dyslipidemia ?  Type 2 diabetes mellitus with chronic kidney disease, with long-term current use of insulin ([place]) ?  Depression ?  Paroxysmal atrial fibrillation (H[facility]) ?  Alcohol abuse ?  ? ? ?Assessment and Plan: ?* Intractable nausea and vomiting ?Resolved.  ? ?Acute respiratory failure with hypoxia ([place]) ?Improved.  ? ?Acute on chronic systolic CHF (congestive heart failure) ([place]) ?Breathing symptoms has improved, grossly looks euvolemic. Will need gentle hydration.  ?- His last EF was 35%-40 % on 0/56/9794 and diastolic function could not be evaluated then. ?Cardiology following for med adjustments.  ? ?AKI (acute kidney injury) ([place]) ?Cr 4.05 (baseline 2.8) > 4.38 ?Bladder scan 10cc.  Renal US- neg.  ?Will continue gentle hydration for another 24 hrs.  ? ?Chest pain ?Resolved.  ? ?Hypertensive urgency ?BP remains uncontrolled.  ?Cardiology adjust meds.  ?IV prn has been ordered.  ? ? ?Type 2 diabetes mellitus with chronic kidney disease, with long-term current use  of insulin ([place]) ?- The patient will be placed on supplement coverage with NovoLog. ?-Semglee 10U daily. Holding [place] ?ISS accuchecks.  ? ?Dyslipidemia ?- We will continue statin therapy and fenofibrate.  The patient is also on Repatha (not on formulary here) ? ?Chronic kidney disease, stage 3b ([place]) ?Baseline Cr 2.8. trending upwards.  ? ?Paroxysmal atrial fibrillation ([place]) ?On Eliquis ? ?Depression ?continue Zoloft. ? ?Alcohol abuse ?Monitor for withdrawals ? ? ? ? ?  ? ? ? ?DVT prophylaxis: Eliquis ?Code Status:  ?Family Communication:   ? ?Status is: Inpatient ?Remains inpatient appropriate because: Cardiology following fpr CHF and BP management.  Ongoing management for AKI. BP also remains uncontrolled at this time.  ? ? ?Subjective: ?Had headache last night but doing better this morning. No complaints  ? ?ASL interpreter ID [id number] ? ?Examination: ? ?Constitutional: Not in acute distress ?Respiratory: Clear to auscultation bilaterally ?Cardiovascular: Normal sinus rhythm, no rubs ?Abdomen: Nontender nondistended good bowel sounds ?Musculoskeletal: No edema noted ?Skin: No rashes seen ?Neurologic: CN 2-12 grossly intact.  And nonfocal ?Psychiatric: Normal judgment and insight. Alert and oriented x 3. Normal mood.  ? ? ?Objective: ?Vitals:  ? 07/20/21 0419 07/20/21 0527 07/20/21 0633 07/20/21 0800  ?BP: (!) 172/78 (!) 190/97 (!) 181/92 (!) 184/99  ?Pulse: 77 78 75 75  ?Resp: 16 15 16 18   ?Temp: 97.9 ?F (36.6 ?C)   99 ?F (37.2 ?C)  ?TempSrc: Oral   Oral  ?SpO2: 97% 98% 95% 94%  ?Weight: 113.7 kg     ?Height:      ? ? ?Intake/Output Summary (Last 24 hours) at 07/20/2021 0814 ?Last data  filed at 07/19/2021 2300 ?Gross per 24 hour  ?Intake --  ?Output 325 ml  ?Net -325 ml  ? ?Filed Weights  ? 07/18/21 0500 07/19/21 0500 07/20/21 0419  ?Weight: 109.1 kg 109.5 kg 113.7 kg  ? ? ? ?Data Reviewed:  ? ?CBC: ?Recent Labs  ?Lab 07/17/21 ?1500 07/18/21 ?0308 07/20/21 ?0050  ?WBC 7.0 8.3 4.2  ?NEUTROABS 5.8  --   --   ?HGB  12.5* 12.1* 10.0*  ?HCT 42.3 41.1 33.3*  ?MCV 94.8 94.9 93.0  ?PLT 205 212 165  ? ?Basic Metabolic Panel: ?Recent Labs  ?Lab 07/17/21 ?1500 07/18/21 ?0308 07/19/21 ?0208 07/20/21 ?0050  ?NA 146* 144 142 138  ?K 5.1 4.9 4.3 4.0  ?CL 106 105 106 105  ?CO2 25 27 28 25   ?GLUCOSE 163* 186* 93 89  ?BUN 39* 45* 47* 48*  ?CREATININE 2.12* 2.93* 4.05* 4.38*  ?CALCIUM 9.9 9.1 8.7* 8.7*  ?MG  --   --   --  2.1  ?PHOS  --   --  3.7  --   ? ?GFR: ?Estimated Creatinine Clearance: 24.9 mL/min (A) (by C-G formula based on SCr of 4.38 mg/dL (H)). ?Liver Function Tests: ?Recent Labs  ?Lab 07/17/21 ?1500 07/19/21 ?0208  ?AST 43*  --   ?ALT 38  --   ?ALKPHOS 49  --   ?BILITOT 1.3*  --   ?PROT 8.6*  --   ?ALBUMIN 4.2 3.2*  ? ?Recent Labs  ?Lab 07/17/21 ?1500  ?LIPASE 36  ? ?No results for input(s): AMMONIA in the last 168 hours. ?Coagulation Profile: ?No results for input(s): INR, PROTIME in the last 168 hours. ?Cardiac Enzymes: ?No results for input(s): CKTOTAL, CKMB, CKMBINDEX, TROPONINI in the last 168 hours. ?BNP (last 3 results) ?No results for input(s): PROBNP in the last 8760 hours. ?HbA1C: ?Recent Labs  ?  07/18/21 ?1525  ?HGBA1C 7.3*  ? ?CBG: ?Recent Labs  ?Lab 07/19/21 ?1141 07/19/21 ?1731 07/19/21 ?2006 07/20/21 ?0025 07/20/21 ?0431  ?GLUCAP 167* 187* 185* 97 126*  ? ?Lipid Profile: ?No results for input(s): CHOL, HDL, LDLCALC, TRIG, CHOLHDL, LDLDIRECT in the last 72 hours. ?Thyroid Function Tests: ?No results for input(s): TSH, T4TOTAL, FREET4, T3FREE, THYROIDAB in the last 72 hours. ?Anemia Panel: ?No results for input(s): VITAMINB12, FOLATE, FERRITIN, TIBC, IRON, RETICCTPCT in the last 72 hours. ?Sepsis Labs: ?No results for input(s): PROCALCITON, LATICACIDVEN in the last 168 hours. ? ?No results found for this or any previous visit (from the past 240 hour(s)).  ? ? ? ? ? ?Radiology Studies: ?US RENAL ? ?Result Date: 07/19/2021 ?CLINICAL DATA:  Acute kidney injury. EXAM: RENAL / URINARY TRACT ULTRASOUND COMPLETE COMPARISON:   February 01, 2020 FINDINGS: Right Kidney: Renal measurements: 12.7 cm x 5.4 cm x 5.8 cm = volume: 208.2 mL. Echogenicity within normal limits. No mass or hydronephrosis visualized. Left Kidney: Renal measurements: 12.9 cm x 5.8 cm x 4.9 cm = volume: 170.9 mL. Echogenicity within normal limits. No mass or hydronephrosis visualized. Bladder: Appears normal for degree of bladder distention. Other: None. IMPRESSION: Normal renal ultrasound. Electronically Signed   By: Virgina Norfolk M.D.   On: 07/19/2021 21:55   ? ? ? ? ? ?Scheduled Meds: ? apixaban  5 mg Oral BID  ? atorvastatin  80 mg Oral QHS  ? carvedilol  25 mg Oral BID WC  ? fenofibrate  54 mg Oral Daily  ? folic acid  1 mg Oral Daily  ? hydrALAZINE  50 mg Oral Q8H  ? insulin  aspart  0-15 Units Subcutaneous Q4H  ? insulin glargine-yfgn  10 Units Subcutaneous QHS  ? isosorbide mononitrate  60 mg Oral Daily  ? multivitamin with minerals  1 tablet Oral Daily  ? pantoprazole (PROTONIX) IV  40 mg Intravenous Q12H  ? sertraline  50 mg Oral Daily  ? thiamine  100 mg Oral Daily  ? Or  ? thiamine  100 mg Intravenous Daily  ? ?Continuous Infusions: ? sodium chloride    ? ? ? LOS: 2 days  ? ?Time spent= 35 mins ? ? ? ?Beaumont Austad Arsenio Loader, MD ?[facility] ? ?If 7PM-7AM, please contact night-coverage ? ?07/20/2021, 8:14 AM  ? ?

## 2021-07-21 DIAGNOSIS — F1010 Alcohol abuse, uncomplicated: Secondary | ICD-10-CM

## 2021-07-21 DIAGNOSIS — N179 Acute kidney failure, unspecified: Secondary | ICD-10-CM | POA: Diagnosis not present

## 2021-07-21 DIAGNOSIS — R112 Nausea with vomiting, unspecified: Secondary | ICD-10-CM | POA: Diagnosis not present

## 2021-07-21 DIAGNOSIS — I5023 Acute on chronic systolic (congestive) heart failure: Secondary | ICD-10-CM | POA: Diagnosis not present

## 2021-07-21 LAB — GLUCOSE, CAPILLARY
Glucose-Capillary: 113 mg/dL — ABNORMAL HIGH (ref 70–99)
Glucose-Capillary: 151 mg/dL — ABNORMAL HIGH (ref 70–99)
Glucose-Capillary: 182 mg/dL — ABNORMAL HIGH (ref 70–99)
Glucose-Capillary: 194 mg/dL — ABNORMAL HIGH (ref 70–99)
Glucose-Capillary: 198 mg/dL — ABNORMAL HIGH (ref 70–99)
Glucose-Capillary: 212 mg/dL — ABNORMAL HIGH (ref 70–99)
Glucose-Capillary: 96 mg/dL (ref 70–99)

## 2021-07-21 LAB — MAGNESIUM: Magnesium: 2 mg/dL (ref 1.7–2.4)

## 2021-07-21 LAB — CBC
HCT: 31.4 % — ABNORMAL LOW (ref 39.0–52.0)
Hemoglobin: 9.8 g/dL — ABNORMAL LOW (ref 13.0–17.0)
MCH: 28.9 pg (ref 26.0–34.0)
MCHC: 31.2 g/dL (ref 30.0–36.0)
MCV: 92.6 fL (ref 80.0–100.0)
Platelets: 149 10*3/uL — ABNORMAL LOW (ref 150–400)
RBC: 3.39 MIL/uL — ABNORMAL LOW (ref 4.22–5.81)
RDW: 13.3 % (ref 11.5–15.5)
WBC: 3.5 10*3/uL — ABNORMAL LOW (ref 4.0–10.5)
nRBC: 0 % (ref 0.0–0.2)

## 2021-07-21 LAB — BASIC METABOLIC PANEL
Anion gap: 5 (ref 5–15)
BUN: 38 mg/dL — ABNORMAL HIGH (ref 6–20)
CO2: 26 mmol/L (ref 22–32)
Calcium: 8.5 mg/dL — ABNORMAL LOW (ref 8.9–10.3)
Chloride: 109 mmol/L (ref 98–111)
Creatinine, Ser: 3.54 mg/dL — ABNORMAL HIGH (ref 0.61–1.24)
GFR, Estimated: 20 mL/min — ABNORMAL LOW (ref >60)
Glucose, Bld: 106 mg/dL — ABNORMAL HIGH (ref 70–99)
Potassium: 3.9 mmol/L (ref 3.5–5.1)
Sodium: 140 mmol/L (ref 135–145)

## 2021-07-21 MED ORDER — ISOSORBIDE MONONITRATE ER 30 MG PO TB24
90.0000 mg | ORAL_TABLET | Freq: Every day | ORAL | Status: DC
  Administered 2021-07-21 – 2021-07-22 (×2): 90 mg via ORAL
  Filled 2021-07-21 (×2): qty 1

## 2021-07-21 MED ORDER — HYDRALAZINE HCL 50 MG PO TABS
100.0000 mg | ORAL_TABLET | Freq: Three times a day (TID) | ORAL | Status: DC
Start: 2021-07-21 — End: 2021-07-22
  Administered 2021-07-21 – 2021-07-22 (×3): 100 mg via ORAL
  Filled 2021-07-21 (×3): qty 2

## 2021-07-21 MED ORDER — AMLODIPINE BESYLATE 5 MG PO TABS
5.0000 mg | ORAL_TABLET | Freq: Every day | ORAL | Status: DC
  Administered 2021-07-21 – 2021-07-22 (×2): 5 mg via ORAL
  Filled 2021-07-21 (×2): qty 1

## 2021-07-21 NOTE — Plan of Care (Signed)
?  Problem: Education: ?Goal: Knowledge of General Education information will improve ?Description: Including pain rating scale, medication(s)/side effects and non-pharmacologic comfort measures ?Outcome: Progressing ?  ?Problem: Health Behavior/Discharge Planning: ?Goal: Ability to manage health-related needs will improve ?Outcome: Progressing ?  ?Problem: Clinical Measurements: ?Goal: Will remain free from infection ?Outcome: Completed/Met ?Goal: Cardiovascular complication will be avoided ?Outcome: Progressing ?  ?Problem: Activity: ?Goal: Risk for activity intolerance will decrease ?Outcome: Completed/Met ?  ?

## 2021-07-21 NOTE — Progress Notes (Signed)
?      ?                 PROGRESS NOTE ? ?      ?PATIENT DETAILS ?Name: Troy Rush ?Age: 51 y.o. ?Sex: male ?Date of Birth: 06/08/1970 ?Admit Date: 07/17/2021 ?Admitting Physician Christel Mormon, MD ?XFG:HWEX, Diona Foley, NP ? ?Brief Summary: ?Patient is a 51 y.o.  male with history of HFrEF, CKD stage IIIb, PAF-presented with nausea/vomiting, chest pain-patient was subsequently admitted to the hospitalist service.  See below for further details. ? ?Significant events: ?4/8>> admit to [facility]-intractable nausea/vomiting ? ?Significant studies: ?4/8>> CTA chest/abdomen: No aneurysm or dissection. ?4/10>> renal ultrasound: No hydronephrosis ? ?Significant microbiology data: ? ? ?Procedures: ? ? ?Consults: ?Cardiology ? ?Subjective: ?Lying comfortably in bed-denies any chest pain or shortness of breath. ? ?Objective: ?Vitals: ?Blood pressure 140/86, pulse 76, temperature 98.7 ?F (37.1 ?C), temperature source Oral, resp. rate 20, height 5\' 10"  (1.778 m), weight 113.7 kg, SpO2 96 %.  ? ?Exam: ?Gen Exam:Alert awake-not in any distress ?HEENT:atraumatic, normocephalic ?Chest: B/L clear to auscultation anteriorly ?CVS:S1S2 regular ?Abdomen:soft non tender, non distended ?Extremities:no edema ?Neurology: Non focal ?Skin: no rash ? ?Pertinent Labs/Radiology: ? ?  Latest Ref Rng & Units 07/21/2021  ?  2:43 AM 07/20/2021  ? 12:50 AM 07/18/2021  ?  3:08 AM  ?CBC  ?WBC 4.0 - 10.5 K/uL 3.5   4.2   8.3    ?Hemoglobin 13.0 - 17.0 g/dL 9.8   10.0   12.1    ?Hematocrit 39.0 - 52.0 % 31.4   33.3   41.1    ?Platelets 150 - 400 K/uL 149   165   212    ?  ?Lab Results  ?Component Value Date  ? NA 140 07/21/2021  ? K 3.9 07/21/2021  ? CL 109 07/21/2021  ? CO2 26 07/21/2021  ?  ? ? ?Assessment/Plan: ?Intractable nausea/vomiting: Resolved with supportive care. ? ?AKI on CKD stage IIIb: AKI likely secondary to contrast nephropathy-improving-continue to avoid nephrotoxic agents. ? ?Acute on chronic HFrEF: Volume status relatively stable-given AKI-and  diuretics currently on hold.  Has ICD in place.  Cardiology following. ? ?History of atrial fibrillation-s/p ablation 03/02/2021: Maintaining sinus rhythm-continue telemetry monitoring ? ?History of VTE: On Eliquis ? ?HTN: Better controlled-continue Imdur, hydralazine, Coreg and amlodipine. ? ?HLD: Continue statin and fenofibrate. ? ?DM-2 (A1c 7.3 on 4/9): CBG stable-continue 10 units of Semglee and SSI. ? ?Recent Labs  ?  07/21/21 ?0334 07/21/21 ?0820 07/21/21 ?1211  ?GLUCAP 96 113* 198*  ? ?EtOH abuse: No signs of withdrawal-continue Ativan per CIWA protocol ? ?OSA on CPAP ? ?Obesity: ?Estimated body mass index is 35.97 kg/m? as calculated from the following: ?  Height as of this encounter: 5\' 10"  (1.778 m). ?  Weight as of this encounter: 113.7 kg.  ? ?Code status: ?  Code Status: Full Code  ? ?DVT Prophylaxis: ?apixaban (ELIQUIS) tablet 5 mg   ? ?Family Communication: None at bedside ? ? ?Disposition Plan: ?Status is: Inpatient ?Remains inpatient appropriate because: Improving AKI-probably needs another 1-2 days of hospitalization before consideration of discharge. ?  ?Planned Discharge Destination:Home ? ? ?Diet: ?Diet Order   ? ?       ?  Diet heart healthy/carb modified Room service appropriate? Yes with Assist; Fluid consistency: Thin; Fluid restriction: 1500 mL Fluid  Diet effective now       ?  ? ?  ?  ? ?  ?  ? ? ?  Antimicrobial agents: ?Anti-infectives (From admission, onward)  ? ? None  ? ?  ? ? ? ?MEDICATIONS: ?Scheduled Meds: ? amLODipine  5 mg Oral Daily  ? apixaban  5 mg Oral BID  ? atorvastatin  80 mg Oral QHS  ? carvedilol  25 mg Oral BID WC  ? fenofibrate  54 mg Oral Daily  ? folic acid  1 mg Oral Daily  ? hydrALAZINE  100 mg Oral Q8H  ? insulin aspart  0-15 Units Subcutaneous Q4H  ? insulin glargine-yfgn  10 Units Subcutaneous QHS  ? isosorbide mononitrate  90 mg Oral Daily  ? multivitamin with minerals  1 tablet Oral Daily  ? pantoprazole (PROTONIX) IV  40 mg Intravenous Q12H  ? sertraline  50  mg Oral Daily  ? thiamine  100 mg Oral Daily  ? Or  ? thiamine  100 mg Intravenous Daily  ? ?Continuous Infusions: ?PRN Meds:.acetaminophen **OR** acetaminophen, dextromethorphan-guaiFENesin, hydrALAZINE, LORazepam **OR** LORazepam, magnesium hydroxide, ondansetron **OR** ondansetron (ZOFRAN) IV, senna-docusate, traZODone ? ? ?I have personally reviewed following labs and imaging studies ? ?LABORATORY DATA: ?CBC: ?Recent Labs  ?Lab 07/17/21 ?1500 07/18/21 ?0308 07/20/21 ?8527 07/21/21 ?0243  ?WBC 7.0 8.3 4.2 3.5*  ?NEUTROABS 5.8  --   --   --   ?HGB 12.5* 12.1* 10.0* 9.8*  ?HCT 42.3 41.1 33.3* 31.4*  ?MCV 94.8 94.9 93.0 92.6  ?PLT 205 212 165 149*  ? ? ?Basic Metabolic Panel: ?Recent Labs  ?Lab 07/17/21 ?1500 07/18/21 ?0308 07/19/21 ?0208 07/20/21 ?7824 07/21/21 ?0243  ?NA 146* 144 142 138 140  ?K 5.1 4.9 4.3 4.0 3.9  ?CL 106 105 106 105 109  ?CO2 25 27 28 25 26   ?GLUCOSE 163* 186* 93 89 106*  ?BUN 39* 45* 47* 48* 38*  ?CREATININE 2.12* 2.93* 4.05* 4.38* 3.54*  ?CALCIUM 9.9 9.1 8.7* 8.7* 8.5*  ?MG  --   --   --  2.1 2.0  ?PHOS  --   --  3.7  --   --   ? ? ?GFR: ?Estimated Creatinine Clearance: 30.8 mL/min (A) (by C-G formula based on SCr of 3.54 mg/dL (H)). ? ?Liver Function Tests: ?Recent Labs  ?Lab 07/17/21 ?1500 07/19/21 ?0208  ?AST 43*  --   ?ALT 38  --   ?ALKPHOS 49  --   ?BILITOT 1.3*  --   ?PROT 8.6*  --   ?ALBUMIN 4.2 3.2*  ? ?Recent Labs  ?Lab 07/17/21 ?1500  ?LIPASE 36  ? ?No results for input(s): AMMONIA in the last 168 hours. ? ?Coagulation Profile: ?No results for input(s): INR, PROTIME in the last 168 hours. ? ?Cardiac Enzymes: ?No results for input(s): CKTOTAL, CKMB, CKMBINDEX, TROPONINI in the last 168 hours. ? ?BNP (last 3 results) ?No results for input(s): PROBNP in the last 8760 hours. ? ?Lipid Profile: ?No results for input(s): CHOL, HDL, LDLCALC, TRIG, CHOLHDL, LDLDIRECT in the last 72 hours. ? ?Thyroid Function Tests: ?No results for input(s): TSH, T4TOTAL, FREET4, T3FREE, THYROIDAB in the last  72 hours. ? ?Anemia Panel: ?No results for input(s): VITAMINB12, FOLATE, FERRITIN, TIBC, IRON, RETICCTPCT in the last 72 hours. ? ?Urine analysis: ?   ?Component Value Date/Time  ? COLORURINE YELLOW 07/07/2020 1505  ? APPEARANCEUR HAZY (A) 07/07/2020 1505  ? LABSPEC 1.020 07/07/2020 1505  ? PHURINE 5.0 07/07/2020 1505  ? GLUCOSEU >=500 (A) 07/07/2020 1505  ? HGBUR NEGATIVE 07/07/2020 1505  ? BILIRUBINUR negative 04/19/2021 1500  ? BILIRUBINUR Negative 08/13/2019 1103  ? KETONESUR negative 04/19/2021  1500  ? KETONESUR 5 (A) 07/07/2020 1505  ? PROTEINUR 100 (A) 07/07/2020 1505  ? UROBILINOGEN 0.2 04/19/2021 1500  ? UROBILINOGEN 0.2 05/09/2017 1130  ? NITRITE Negative 04/19/2021 1500  ? NITRITE NEGATIVE 07/07/2020 1505  ? LEUKOCYTESUR Negative 04/19/2021 1500  ? LEUKOCYTESUR NEGATIVE 07/07/2020 1505  ? ? ?Sepsis Labs: ?Lactic Acid, Venous ?   ?Component Value Date/Time  ? LATICACIDVEN 1.7 09/07/2020 1356  ? ? ?MICROBIOLOGY: ?No results found for this or any previous visit (from the past 240 hour(s)). ? ?RADIOLOGY STUDIES/RESULTS: ?US RENAL ? ?Result Date: 07/19/2021 ?CLINICAL DATA:  Acute kidney injury. EXAM: RENAL / URINARY TRACT ULTRASOUND COMPLETE COMPARISON:  February 01, 2020 FINDINGS: Right Kidney: Renal measurements: 12.7 cm x 5.4 cm x 5.8 cm = volume: 208.2 mL. Echogenicity within normal limits. No mass or hydronephrosis visualized. Left Kidney: Renal measurements: 12.9 cm x 5.8 cm x 4.9 cm = volume: 170.9 mL. Echogenicity within normal limits. No mass or hydronephrosis visualized. Bladder: Appears normal for degree of bladder distention. Other: None. IMPRESSION: Normal renal ultrasound. Electronically Signed   By: Virgina Norfolk M.D.   On: 07/19/2021 21:55   ? ? LOS: 3 days  ? ?Oren Binet, MD  ?[facility] ? ? ? ?To contact the attending provider between 7A-7P or the covering provider during after hours 7P-7A, please log into the web site [email or web address] and access using universal [place]  password for that web site. If you do not have the password, please call the hospital operator. ? ?07/21/2021, 2:46 PM ? ?  ?

## 2021-07-21 NOTE — TOC Initial Note (Signed)
Transition of Care (TOC) - Initial/Assessment Note  ? ? ?Patient Details  ?Name: Troy Rush ?MRN: [id number] ?Date of Birth: 1971-04-04 ? ?Transition of Care (TOC) CM/SW Contact:    ?Chihiro Frey D Kolby Myung, Student-Social Work ?Phone Number: ?07/21/2021, 2:55 PM ? ?Clinical Narrative:                 ?CSW intern visited patient at bedside to provide resources for substance use treatment facilities and to assess any additional needs.Via sign language interpretor, patient was accepting of resource packet. The patient lives at home with roommate, patient communicated through interpretor that they will have a friend, [place] Po[place] driver, or taxi pick them up at time of discharge. CSW team will follow. ? ?Expected Discharge Plan: Home/Self Care ?Barriers to Discharge: No Barriers Identified ? ? ?Patient Goals and CMS Choice ?  ?  ?  ? ?Expected Discharge Plan and Services ?Expected Discharge Plan: Home/Self Care ?  ?  ?  ?Living arrangements for the past 2 months: Apartment ?                ?  ?  ?  ?  ?  ?  ?  ?  ?  ?  ? ?Prior Living Arrangements/Services ?Living arrangements for the past 2 months: Apartment ?Lives with:: Roommate ?Patient language and need for interpreter reviewed:: Yes ?       ?Need for Family Participation in Patient Care: No (Comment) ?Care giver support system in place?: Yes (comment) ?  ?Criminal Activity/Legal Involvement Pertinent to Current Situation/Hospitalization: No - Comment as needed ? ?Activities of Daily Living ?Home Assistive Devices/Equipment: None ?ADL Screening (condition at time of admission) ?Patient's cognitive ability adequate to safely complete daily activities?: Yes ?Is the patient deaf or have difficulty hearing?: Yes ?Does the patient have difficulty seeing, even when wearing glasses/contacts?: No ?Does the patient have difficulty concentrating, remembering, or making decisions?: No ?Patient able to express need for assistance with ADLs?: Yes ?Does the patient have difficulty dressing or  bathing?: No ?Independently performs ADLs?: Yes (appropriate for developmental age) ?Does the patient have difficulty walking or climbing stairs?: No ?Weakness of Legs: None ?Weakness of Arms/Hands: None ? ?Permission Sought/Granted ?  ?  ?   ?   ?   ?   ? ?Emotional Assessment ?Appearance:: Developmentally appropriate ?Attitude/Demeanor/Rapport: Engaged ?Affect (typically observed): Pleasant ?Orientation: : Oriented to Self, Oriented to Place, Oriented to  Time, Oriented to Situation ?Alcohol / Substance Use: Alcohol Use ?Psych Involvement: No (comment) ? ?Admission diagnosis:  Epigastric pain [R10.13] ?Uncontrollable nausea and vomiting [R11.2] ?Intractable nausea and vomiting [R11.2] ?Hypertension, unspecified type [I10] ?Patient Active Problem List  ? Diagnosis Date Noted  ? Acute on chronic systolic CHF (congestive heart failure) ([place]) 07/18/2021  ? Acute respiratory failure with hypoxia ([place]) 07/18/2021  ? Intractable nausea and vomiting 07/17/2021  ? Chest pain 07/17/2021  ? Type 2 diabetes mellitus with chronic kidney disease, with long-term current use of insulin ([place]) 07/17/2021  ? Depression 07/17/2021  ? Paroxysmal atrial fibrillation ([place]) 07/17/2021  ? Hypertensive urgency 07/17/2021  ? Alcohol abuse 07/17/2021  ? Subcortical infarction ([place]) 10/03/2020  ? Cerebral thrombosis with cerebral infarction 10/01/2020  ? Unilateral weakness 09/30/2020  ? History of atrial fibrillation 09/30/2020  ? History of DVT (deep vein thrombosis) 09/30/2020  ? Chronic kidney disease, stage 3b ([place]) 07/08/2020  ? Acute CHF (congestive heart failure) ([place]) 07/07/2020  ? CKD (chronic kidney disease), stage IV ([place]) 07/07/2020  ? ICD (implantable cardioverter-defibrillator) in  place 05/14/2020  ? Pulmonary embolism ([place]) 02/05/2020  ? AKI (acute kidney injury) ([place])   ? Chronic combined systolic and diastolic heart failure ([place])   ? Atrial fibrillation with RVR ([place]) 02/01/2020  ? NSVT (nonsustained ventricular tachycardia)  ([place]) 02/01/2020  ? Acute GI bleeding 02/01/2020  ? DKA (diabetic ketoacidosis) ([place]) 02/01/2020  ? Bilateral deafness 08/15/2019  ? Type 2 diabetes mellitus with complication, with long-term current use of insulin ([place]) 08/15/2019  ? Hemoglobin A1C between 7% and 9% indicating borderline diabetic control ([place]) 08/15/2019  ? Insomnia 08/15/2019  ? Abdominal pain 11/14/2018  ? Ischemic cardiomyopathy 10/14/2016  ? NICM (nonischemic cardiomyopathy) ([place]) 09/15/2016  ? Chronic systolic CHF (congestive heart failure) ([place]) 05/30/2016  ? HTN (hypertension) 05/30/2016  ? Deaf 05/30/2016  ? Hyperkalemia 05/30/2016  ? Major depressive disorder, recurrent episode ([place]) 03/21/2016  ? Bronchitis 01/21/2015  ? Dyslipidemia 01/21/2015  ? Vitamin D deficiency 12/19/2011  ? Personal history of other infectious and parasitic diseases 10/25/2011  ? Anxiety disorder 05/12/2011  ? Hyperlipidemia 05/12/2011  ? Hypertriglyceridemia 05/12/2011  ? Obstructive sleep apnea 05/12/2011  ? ?PCP:  Vevelyn Francois, NP ?Pharmacy:   ?[facility] [place] [facility] [address] ?[place] [address] ?Phone: [phone] Fax: [phone] ? ? ? ? ?Social Determinants of Health (SDOH) Interventions ?  ? ?Readmission Risk Interventions ? ?  07/09/2020  ? 12:01 PM 02/06/2020  ?  3:53 PM  ?Readmission Risk Prevention Plan  ?Transportation Screening Complete   ?PCP or Specialist Appt within 3-5 Days Complete Complete  ?[place] or Home Care Consult Complete Complete  ?Social Work Consult for [place] Planning/Counseling Complete Complete  ?Palliative Care Screening Complete Not Applicable  ?Medication Review Press photographer) Complete Complete  ? ? ? ?

## 2021-07-21 NOTE — Progress Notes (Signed)
Pharmacist Heart Failure Core Measure Documentation ? ?Assessment: ?Troy Rush has an EF documented as 35-40% on 09/30/2020  by Echo. ? ?Rationale: Heart failure patients with left ventricular systolic dysfunction (LVSD) and an EF < 40% should be prescribed an angiotensin converting enzyme inhibitor (ACEI) or angiotensin receptor blocker (ARB) at discharge unless a contraindication is documented in the medical record. ? ?This patient is not currently on an ACEI or ARB for HF. ? ?This note is being placed in the record in order to provide documentation that a contraindication to the use of these agents is present for this encounter. ? ?ACE Inhibitor or Angiotensin Receptor Blocker is contraindicated ?(specify all that apply) ? ?[x]   ACEI allergy AND ARB allergy (Lisinopril causes cough in this patient). ?[]   Angioedema ?[]   Moderate or severe aortic stenosis ?[]   Hyperkalemia ?[]   Hypotension ?[]   Renal artery stenosis ?[x]   Worsening renal function, preexisting renal disease or dysfunction ? ?Thank you for allowing pharmacy to be part of this patients care team. ?Nicole Cella, RPh ?Clinical Pharmacist ?

## 2021-07-21 NOTE — Progress Notes (Signed)
? ?Progress Note ? ?Patient Name: Troy Rush ?Date of Encounter: 07/21/2021 ? ?[facility] Cardiologist: Loralie Champagne, MD  ? ?Subjective  ? ?Breathing is OK  No CP ? ?Inpatient Medications  ?  ?Scheduled Meds: ? apixaban  5 mg Oral BID  ? atorvastatin  80 mg Oral QHS  ? carvedilol  25 mg Oral BID WC  ? fenofibrate  54 mg Oral Daily  ? folic acid  1 mg Oral Daily  ? hydrALAZINE  75 mg Oral Q8H  ? insulin aspart  0-15 Units Subcutaneous Q4H  ? insulin glargine-yfgn  10 Units Subcutaneous QHS  ? isosorbide mononitrate  60 mg Oral Daily  ? multivitamin with minerals  1 tablet Oral Daily  ? pantoprazole (PROTONIX) IV  40 mg Intravenous Q12H  ? sertraline  50 mg Oral Daily  ? thiamine  100 mg Oral Daily  ? Or  ? thiamine  100 mg Intravenous Daily  ? ?Continuous Infusions: ? sodium chloride 50 mL/hr at 07/21/21 0020  ? ?PRN Meds: ?acetaminophen **OR** acetaminophen, dextromethorphan-guaiFENesin, hydrALAZINE, LORazepam **OR** LORazepam, magnesium hydroxide, ondansetron **OR** ondansetron (ZOFRAN) IV, senna-docusate, traZODone  ? ?Vital Signs  ?  ?Vitals:  ? 07/20/21 1731 07/20/21 1941 07/20/21 2317 07/21/21 0332  ?BP: (!) 185/99 (!) 158/78 (!) 180/89 (!) 159/83  ?Pulse:  76 76 81  ?Resp:  17 14 20   ?Temp:  98.6 ?F (37 ?C) 98.6 ?F (37 ?C) 98.7 ?F (37.1 ?C)  ?TempSrc:  Oral Oral Oral  ?SpO2:  98% 92% 97%  ?Weight:      ?Height:      ? ? ?Intake/Output Summary (Last 24 hours) at 07/21/2021 0805 ?Last data filed at 07/21/2021 0541 ?Gross per 24 hour  ?Intake 1519.3 ml  ?Output 1000 ml  ?Net 519.3 ml  ? ?+1490 cc ?   ? ?  07/20/2021  ?  4:19 AM 07/19/2021  ?  5:00 AM 07/18/2021  ?  5:00 AM  ?Last 3 Weights  ?Weight (lbs) 250 lb 10.6 oz 241 lb 6.5 oz 240 lb 8.4 oz  ?Weight (kg) 113.7 kg 109.5 kg 109.1 kg  ?   ? ?Telemetry  ?  ?SR  - Personally Reviewed ? ?ECG  ?  ? - Personally Reviewed ? ?Physical Exam  ? ?GEN: Obese 50 yo in no acute distress.   ?Neck: No JVD ?Cardiac: RRR, no murmurs, rubs, or gallops.  ?Respiratory: Clear to  auscultation bilaterally. ?GI: Soft, nontender, non-distended  ?MS: No edema; No deformity. ?Neuro:  Nonfocal  ?Psych: Normal affect  ? ?Labs  ?  ?High Sensitivity Troponin:   ?Recent Labs  ?Lab 07/17/21 ?1500 07/17/21 ?1730  ?TROPONINIHS 30* 40*  ?   ?Chemistry ?Recent Labs  ?Lab 07/17/21 ?1500 07/18/21 ?0308 07/19/21 ?0208 07/20/21 ?0762 07/21/21 ?0243  ?NA 146*   < > 142 138 140  ?K 5.1   < > 4.3 4.0 3.9  ?CL 106   < > 106 105 109  ?CO2 25   < > 28 25 26   ?GLUCOSE 163*   < > 93 89 106*  ?BUN 39*   < > 47* 48* 38*  ?CREATININE 2.12*   < > 4.05* 4.38* 3.54*  ?CALCIUM 9.9   < > 8.7* 8.7* 8.5*  ?MG  --   --   --  2.1 2.0  ?PROT 8.6*  --   --   --   --   ?ALBUMIN 4.2  --  3.2*  --   --   ?AST  19*  --   --   --   --   ?ALT 38  --   --   --   --   ?ALKPHOS 49  --   --   --   --   ?BILITOT 1.3*  --   --   --   --   ?GFRNONAA 37*   < > 17* 15* 20*  ?ANIONGAP 15   < > 8 8 5   ? < > = values in this interval not displayed.  ?  ?Lipids No results for input(s): CHOL, TRIG, HDL, LABVLDL, LDLCALC, CHOLHDL in the last 168 hours.  ?Hematology ?Recent Labs  ?Lab 07/18/21 ?0308 07/20/21 ?0354 07/21/21 ?0243  ?WBC 8.3 4.2 3.5*  ?RBC 4.33 3.58* 3.39*  ?HGB 12.1* 10.0* 9.8*  ?HCT 41.1 33.3* 31.4*  ?MCV 94.9 93.0 92.6  ?MCH 27.9 27.9 28.9  ?MCHC 29.4* 30.0 31.2  ?RDW 13.7 13.2 13.3  ?PLT 212 165 149*  ? ?Thyroid No results for input(s): TSH, FREET4 in the last 168 hours.  ?BNP ?Recent Labs  ?Lab 07/17/21 ?1500  ?BNP 525.4*  ?  ?DDimer No results for input(s): DDIMER in the last 168 hours.  ? ?Radiology  ?  ?US RENAL ? ?Result Date: 07/19/2021 ?CLINICAL DATA:  Acute kidney injury. EXAM: RENAL / URINARY TRACT ULTRASOUND COMPLETE COMPARISON:  February 01, 2020 FINDINGS: Right Kidney: Renal measurements: 12.7 cm x 5.4 cm x 5.8 cm = volume: 208.2 mL. Echogenicity within normal limits. No mass or hydronephrosis visualized. Left Kidney: Renal measurements: 12.9 cm x 5.8 cm x 4.9 cm = volume: 170.9 mL. Echogenicity within normal limits. No mass  or hydronephrosis visualized. Bladder: Appears normal for degree of bladder distention. Other: None. IMPRESSION: Normal renal ultrasound. Electronically Signed   By: Virgina Norfolk M.D.   On: 07/19/2021 21:55   ? ?Cardiac Studies  ? ?Echo limited 09/30/20 ?1. Left ventricular ejection fraction, by estimation, is 35 to 40%. The  ?left ventricle has moderately decreased function. The left ventricle  ?demonstrates global hypokinesis. Left ventricular diastolic function could  ?not be evaluated.  ? 2. Right ventricular systolic function is normal. The right ventricular  ?size is normal. There is normal pulmonary artery systolic pressure.  ? 3. The mitral valve is normal in structure. No evidence of mitral valve  ?regurgitation. No evidence of mitral stenosis.  ? 4. The aortic valve is tricuspid. Aortic valve regurgitation is not  ?visualized. No aortic stenosis is present.  ? 5. The inferior vena cava is dilated in size with <50% respiratory  ?variability, suggesting right atrial pressure of 15 mmHg.  ?  ?  ?[place] 09/10/20 ?[facility] Procedural Findings: ?Hemodynamics (mmHg) ?RA mean 8 ?RV 30/9 ?PA 37/14, mean 25 ?PCWP mean 9 ? ?Oxygen saturations: ?PA 53% ?AO 100% ? ?Cardiac Output (Fick) 4.1  ?Cardiac Index (Fick) 1.93 ? ?Cardiac Output (Thermo) 5.75 ?Cardiac Index (Thermo) 2.7  ?PVR 2.8 WU ?Estimated blood loss <50 mL.  ? ?During this procedure medications were administered to achieve and maintain moderate conscious sedation while the patient's heart rate, blood pressure, and oxygen saturation were continuously monitored and I was present face-to-face 100% of this time. ?  ?[place] 05/21/20 ?1. Optimized filling pressures.  ?2. Mild pulmonary hypertension.  ?3. Preserved cardiac output.  ?  ?Continue current diuretics.  Suspect elevated PA pressure may be related to OSA.  Needs sleep study.  ?  ?Echo 01/2020 ? 1. Left ventricular ejection fraction, by estimation, is 45%. The left  ?ventricle  has mildly decreased function. The  left ventricle demonstrates  ?global hypokinesis. There is moderate left ventricular hypertrophy. Left  ?ventricular diastolic parameters are  ? consistent with Grade I diastolic dysfunction (impaired relaxation).  ? 2. Right ventricular systolic function is mildly reduced. The right  ?ventricular size is normal. Tricuspid regurgitation signal is inadequate  ?for assessing PA pressure.  ? 3. The mitral valve is normal in structure. Trivial mitral valve  ?regurgitation. No evidence of mitral stenosis.  ? 4. The aortic valve is tricuspid. Aortic valve regurgitation is not  ?visualized. No aortic stenosis is present.  ?  ?  ?Echo 01/04/19 ? 1. Left ventricular ejection fraction, by visual estimation, is 30 to  ?35%. The left ventricle has moderately decreased function. Mildly  ?increased left ventricular size. There is mildly increased left  ?ventricular hypertrophy. Diffuse hypokinesis.  ? 2. Left ventricular diastolic Doppler parameters are consistent with  ?pseudonormalization pattern of LV diastolic filling.  ? 3. The mitral valve is normal in structure. Mild mitral valve  ?regurgitation. No evidence of mitral stenosis.  ? 4. The tricuspid valve is normal in structure. Tricuspid valve  ?regurgitation is trivial.  ? 5. The aortic valve is tricuspid Aortic valve regurgitation is trivial by  ?color flow Doppler. Structurally normal aortic valve, with no evidence of  ?sclerosis or stenosis.  ? 6. There is mild dilatation of the ascending aorta measuring 40 mm.  ? 7. Right atrial size was normal.  ? 8. Left atrial size was mild-moderately dilated.  ? 9. Global right ventricle has normal systolic function.The right  ?ventricular size is normal. No increase in right ventricular wall  ?thickness.  ?10. The inferior vena cava is normal in size with greater than 50%  ?respiratory variability, suggesting right atrial pressure of 3 mmHg.  ?11. The tricuspid regurgitant velocity is 3.61 m/s, and with an assumed  ?right atrial  pressure of 3 mmHg, the estimated right ventricular systolic  ?pressure is moderately elevated at 55.1 mmHg.  ?  ? ?Patient Profile  ? KHAYREE DELELLIS is a 51 y.o. male with a hx of deafness, CKD stage 3, hype

## 2021-07-22 DIAGNOSIS — F1010 Alcohol abuse, uncomplicated: Secondary | ICD-10-CM | POA: Diagnosis not present

## 2021-07-22 DIAGNOSIS — I5023 Acute on chronic systolic (congestive) heart failure: Secondary | ICD-10-CM | POA: Diagnosis not present

## 2021-07-22 DIAGNOSIS — N179 Acute kidney failure, unspecified: Secondary | ICD-10-CM | POA: Diagnosis not present

## 2021-07-22 DIAGNOSIS — R112 Nausea with vomiting, unspecified: Secondary | ICD-10-CM | POA: Diagnosis not present

## 2021-07-22 LAB — BASIC METABOLIC PANEL
Anion gap: 6 (ref 5–15)
BUN: 31 mg/dL — ABNORMAL HIGH (ref 6–20)
CO2: 24 mmol/L (ref 22–32)
Calcium: 8.9 mg/dL (ref 8.9–10.3)
Chloride: 108 mmol/L (ref 98–111)
Creatinine, Ser: 2.87 mg/dL — ABNORMAL HIGH (ref 0.61–1.24)
GFR, Estimated: 26 mL/min — ABNORMAL LOW (ref >60)
Glucose, Bld: 107 mg/dL — ABNORMAL HIGH (ref 70–99)
Potassium: 3.7 mmol/L (ref 3.5–5.1)
Sodium: 138 mmol/L (ref 135–145)

## 2021-07-22 LAB — GLUCOSE, CAPILLARY
Glucose-Capillary: 93 mg/dL (ref 70–99)
Glucose-Capillary: 107 mg/dL — ABNORMAL HIGH (ref 70–99)
Glucose-Capillary: 149 mg/dL — ABNORMAL HIGH (ref 70–99)

## 2021-07-22 LAB — MAGNESIUM: Magnesium: 2 mg/dL (ref 1.7–2.4)

## 2021-07-22 MED ORDER — ISOSORBIDE MONONITRATE ER 60 MG PO TB24: 90.0000 mg | ORAL_TABLET | Freq: Every day | ORAL | 2 refills | Status: DC

## 2021-07-22 MED ORDER — HYDRALAZINE HCL 100 MG PO TABS: 100.0000 mg | ORAL_TABLET | Freq: Three times a day (TID) | ORAL | 3 refills | Status: DC

## 2021-07-22 MED ORDER — AMLODIPINE BESYLATE 5 MG PO TABS: 5.0000 mg | ORAL_TABLET | Freq: Every day | ORAL | 2 refills | Status: DC

## 2021-07-22 MED ORDER — LANTUS SOLOSTAR 100 UNIT/ML ~~LOC~~ SOPN: 10.0000 [IU] | PEN_INJECTOR | Freq: Every day | SUBCUTANEOUS | 3 refills | Status: AC

## 2021-07-22 NOTE — Progress Notes (Signed)
? ?  Progress Note ? ?Patient Name: Troy Rush ?Date of Encounter: 07/22/2021 ? ?[facility] Cardiologist: Loralie Champagne, MD  ? ?Note plans for D/C today   Cr continues to improve   (2.87) ?BP is better  ?Will make sure he has follow up with [facility] ?Pt also needs follow up with renal service for optimization of BP  ? ?  ?   ?Signed, ?Dorris Carnes, MD  ?07/22/2021, 8:05 AM   ? ?

## 2021-07-22 NOTE — Plan of Care (Signed)
?  Problem: Education: ?Goal: Knowledge of General Education information will improve ?Description: Including pain rating scale, medication(s)/side effects and non-pharmacologic comfort measures ?Outcome: Adequate for Discharge ?  ?Problem: Health Behavior/Discharge Planning: ?Goal: Ability to manage health-related needs will improve ?Outcome: Adequate for Discharge ?  ?Problem: Clinical Measurements: ?Goal: Ability to maintain clinical measurements within normal limits will improve ?Outcome: Adequate for Discharge ?Goal: Diagnostic test results will improve ?Outcome: Adequate for Discharge ?Goal: Respiratory complications will improve ?Outcome: Adequate for Discharge ?Goal: Cardiovascular complication will be avoided ?Outcome: Adequate for Discharge ?  ?Problem: Coping: ?Goal: Level of anxiety will decrease ?Outcome: Adequate for Discharge ?  ?Problem: Pain Managment: ?Goal: General experience of comfort will improve ?Outcome: Adequate for Discharge ?  ?Problem: Safety: ?Goal: Ability to remain free from injury will improve ?Outcome: Adequate for Discharge ?  ?

## 2021-07-22 NOTE — Plan of Care (Signed)
?  Problem: Education: ?Goal: Knowledge of General Education information will improve ?Description: Including pain rating scale, medication(s)/side effects and non-pharmacologic comfort measures ?Outcome: Progressing ?  ?Problem: Health Behavior/Discharge Planning: ?Goal: Ability to manage health-related needs will improve ?Outcome: Progressing ?  ?Problem: Clinical Measurements: ?Goal: Ability to maintain clinical measurements within normal limits will improve ?Outcome: Progressing ?Goal: Diagnostic test results will improve ?Outcome: Progressing ?Goal: Respiratory complications will improve ?Outcome: Progressing ?Goal: Cardiovascular complication will be avoided ?Outcome: Progressing ?  ?Problem: Coping: ?Goal: Level of anxiety will decrease ?Outcome: Progressing ?  ?Problem: Pain Managment: ?Goal: General experience of comfort will improve ?Outcome: Progressing ?  ?Problem: Safety: ?Goal: Ability to remain free from injury will improve ?Outcome: Progressing ?  ?

## 2021-07-22 NOTE — Discharge Summary (Signed)
? ?PATIENT DETAILS ?Name: Troy Rush ?Age: 51 y.o. ?Sex: male ?Date of Birth: 14-Jul-1970 ?MRN: [id number]. ?Admitting Physician: Christel Mormon, MD ?QHU:TMLY, Diona Foley, NP ? ?Admit Date: 07/17/2021 ?Discharge date: 07/22/2021 ? ?Recommendations for Outpatient Follow-up:  ?Follow up with PCP in 1-2 weeks ?Please obtain CMP/CBC in one week ?Please ensure follow-up with cardiology and nephrology. ? ? ?Admitted From:  ?Home ? ?Disposition: ?Home ?  ?Discharge Condition: ?good ? ?CODE STATUS: ?  Code Status: Full Code  ? ?Diet recommendation:  ?Diet Order   ? ?       ?  Diet - low sodium heart healthy       ?  ?  Diet Carb Modified       ?  ?  Diet heart healthy/carb modified Room service appropriate? Yes with Assist; Fluid consistency: Thin; Fluid restriction: 1500 mL Fluid  Diet effective now       ?  ? ?  ?  ? ?  ?  ? ?Brief Summary: ?Patient is a 51 y.o.  male with history of HFrEF, CKD stage IIIb, PAF-presented with nausea/vomiting, chest pain-patient was subsequently admitted to the hospitalist service.  See below for further details. ?  ?Significant events: ?4/8>> admit to [facility]-intractable nausea/vomiting ?  ?Significant studies: ?4/8>> CTA chest/abdomen: No aneurysm or dissection. ?4/10>> renal ultrasound: No hydronephrosis ?  ?Significant microbiology data: ?  ?  ?Procedures: ?  ?  ?Consults: ?Cardiology ? ?Brief Hospital Course: ?Intractable nausea/vomiting: Resolved with supportive care. ?  ?AKI on CKD stage IIIb: AKI likely secondary to contrast nephropathy-improved with supportive care-creatinine almost close to baseline.  Please ensure follow-up with nephrology.   ?  ?Acute on chronic HFrEF: Volume status relatively stable-given AKI-diuretics were briefly held-since creatinine is back to baseline-we will resume his usual dosing of diuretics on discharge.  Discussed with cardiologist-Dr. Harrington Challenger over the phone today-cardiology will arrange follow-up with his primary cardiologist-Dr. Aundra Dubin.  Has ICD in place.   ? ?History of atrial fibrillation-s/p ablation 03/02/2021: Maintaining sinus rhythm-continue telemetry monitoring ?  ?History of VTE: On Eliquis ?  ?HTN: Better controlled-continue dosage of Imdur and hydralazine were increased, amlodipine was added-remains on usual dosing of Coreg.  Continue to optimize in the outpatient setting. ?  ?HLD: Continue statin and fenofibrate. ?  ?DM-2 (A1c 7.3 on 4/9): CBG stable-with just 10 units of Semglee-PCP to follow and adjust accordingly. ? ?EtOH abuse: No signs of withdrawal-managed with Ativan per CIWA protocol.  Counseled. ?  ?OSA on CPAP ?  ?Obesity: ?Estimated body mass index is 35.97 kg/m? as calculated from the following: ?  Height as of this encounter: 5\' 10"  (1.778 m). ?  Weight as of this encounter: 113.7 kg.  ? ?Discharge Diagnoses:  ?Principal Problem: ?  Intractable nausea and vomiting ?Active Problems: ?  AKI (acute kidney injury) ([place]) ?  Acute on chronic systolic CHF (congestive heart failure) ([place]) ?  Acute respiratory failure with hypoxia ([place]) ?  Chest pain ?  Hypertensive urgency ?  Chronic kidney disease, stage 3b ([place]) ?  Dyslipidemia ?  Type 2 diabetes mellitus with chronic kidney disease, with long-term current use of insulin ([place]) ?  Depression ?  Paroxysmal atrial fibrillation ([facility]) ?  Alcohol abuse ? ? ?Discharge Instructions: ? ?Activity:  ?As tolerated  ? ?Discharge Instructions   ? ? Diet - low sodium heart healthy   Complete by: As directed ?  ? Diet Carb Modified   Complete by: As directed ?  ? Discharge  instructions   Complete by: As directed ?  ? Follow with Primary MD  Vevelyn Francois, NP in 1-2 weeks ? ?Please follow-up with your primary cardiologist and primary nephrologist in the next 1-2 weeks. ? ?Please get a complete blood count and chemistry panel checked by your Primary MD at your next visit, and again as instructed by your Primary MD. ? ?Get Medicines reviewed and adjusted: ?Please take all your medications with you for your next  visit with your Primary MD ? ?Laboratory/radiological data: ?Please request your Primary MD to go over all hospital tests and procedure/radiological results at the follow up, please ask your Primary MD to get all Hospital records sent to his/her office. ? ?In some cases, they will be blood work, cultures and biopsy results pending at the time of your discharge. Please request that your primary care M.D. follows up on these results. ? ?Also Note the following: ?If you experience worsening of your admission symptoms, develop shortness of breath, life threatening emergency, suicidal or homicidal thoughts you must seek medical attention immediately by calling 911 or calling your MD immediately  if symptoms less severe. ? ?You must read complete instructions/literature along with all the possible adverse reactions/side effects for all the Medicines you take and that have been prescribed to you. Take any new Medicines after you have completely understood and accpet all the possible adverse reactions/side effects.  ? ?Do not drive when taking Pain medications or sleeping medications (Benzodaizepines) ? ?Do not take more than prescribed Pain, Sleep and Anxiety Medications. It is not advisable to combine anxiety,sleep and pain medications without talking with your primary care practitioner ? ?Special Instructions: If you have smoked or chewed Tobacco  in the last 2 yrs please stop smoking, stop any regular Alcohol  and or any Recreational drug use. ? ?Wear Seat belts while driving. ? ?Please note: ?You were cared for by a hospitalist during your hospital stay. Once you are discharged, your primary care physician will handle any further medical issues. Please note that NO REFILLS for any discharge medications will be authorized once you are discharged, as it is imperative that you return to your primary care physician (or establish a relationship with a primary care physician if you do not have one) for your post hospital  discharge needs so that they can reassess your need for medications and monitor your lab values.  ? Increase activity slowly   Complete by: As directed ?  ? ?  ? ?Allergies as of 07/22/2021   ? ?   Reactions  ? Lisinopril Cough  ? ?  ? ?  ?Medication List  ?  ? ?TAKE these medications   ? ?acetaminophen 325 MG tablet ?Commonly known as: TYLENOL ?Take 2 tablets (650 mg total) by mouth every 4 (four) hours as needed for mild pain (or temp > 37.5 C (99.5 F)). ?  ?amLODipine 5 MG tablet ?Commonly known as: NORVASC ?Take 1 tablet (5 mg total) by mouth daily. ?Start taking on: July 23, 2021 ?  ?apixaban 5 MG Tabs tablet ?Commonly known as: Eliquis ?TAKE 1 TABLET(5 MG) BY MOUTH TWICE DAILY ?What changed:  ?how much to take ?how to take this ?when to take this ?additional instructions ?  ?atorvastatin 80 MG tablet ?Commonly known as: LIPITOR ?Take 1 tablet (80 mg total) by mouth at bedtime. ?  ?carvedilol 25 MG tablet ?Commonly known as: COREG ?TAKE 1 TABLET (25 MG TOTAL) BY MOUTH 2 (TWO) TIMES DAILY. ?  ?D3-1000 PO ?  Take 1,000 Units by mouth daily. ?  ?dapagliflozin propanediol 10 MG Tabs tablet ?Commonly known as: [place] ?Take 1 tablet (10 mg total) by mouth daily before breakfast. ?  ?fenofibrate 54 MG tablet ?Take 1 tablet (54 mg total) by mouth daily. ?  ?FreeStyle Libre 3 Sensor Misc ?1 application by Does not apply route every 14 (fourteen) days. Place 1 sensor on the skin every 14 days. Use to check glucose continuously ?  ?furosemide 80 MG tablet ?Commonly known as: LASIX ?Take 1 tablet (80 mg total) by mouth 2 (two) times daily. ?  ?hydrALAZINE 100 MG tablet ?Commonly known as: APRESOLINE ?Take 1 tablet (100 mg total) by mouth 3 (three) times daily. ?What changed:  ?medication strength ?how much to take ?  ?isosorbide mononitrate 60 MG 24 hr tablet ?Commonly known as: IMDUR ?Take 1.5 tablets (90 mg total) by mouth daily. ?What changed: how much to take ?  ?Lantus SoloStar 100 UNIT/ML Solostar Pen ?Generic drug:  insulin glargine ?Inject 10 Units into the skin at bedtime. ?What changed: how much to take ?  ?latanoprost 0.005 % ophthalmic solution ?Commonly known as: XALATAN ?Place 1 drop into both eyes at bedtime. ?  ?meto

## 2021-07-22 NOTE — Progress Notes (Signed)
Utilized SL interpreter to explain patient's discharge summary. All belongings were picked up and delivered to the patient's care. Patient Valuables Paperwork was placed on patient's chart. His IV and telemetry monitor were removed. CCMD was notified. Patient is calling an uber to transport home. Will transport patient down for discharge.  ?

## 2021-07-23 ENCOUNTER — Encounter (HOSPITAL_COMMUNITY): Payer: Medicare Other

## 2021-07-26 ENCOUNTER — Telehealth (HOSPITAL_COMMUNITY): Payer: Self-pay

## 2021-07-26 ENCOUNTER — Ambulatory Visit: Payer: Medicare Other | Admitting: Nurse Practitioner

## 2021-07-26 ENCOUNTER — Emergency Department (HOSPITAL_COMMUNITY)
Admission: EM | Admit: 2021-07-26 | Discharge: 2021-07-26 | Discharge status: Against Medical Advice | Payer: Medicare Other | Attending: Emergency Medicine | Admitting: Emergency Medicine

## 2021-07-26 ENCOUNTER — Encounter (HOSPITAL_COMMUNITY): Payer: Self-pay

## 2021-07-26 ENCOUNTER — Other Ambulatory Visit: Payer: Self-pay

## 2021-07-26 DIAGNOSIS — R1013 Epigastric pain: Secondary | ICD-10-CM | POA: Diagnosis not present

## 2021-07-26 DIAGNOSIS — R112 Nausea with vomiting, unspecified: Secondary | ICD-10-CM | POA: Diagnosis not present

## 2021-07-26 DIAGNOSIS — I959 Hypotension, unspecified: Secondary | ICD-10-CM | POA: Insufficient documentation

## 2021-07-26 DIAGNOSIS — Z5321 Procedure and treatment not carried out due to patient leaving prior to being seen by health care provider: Secondary | ICD-10-CM | POA: Insufficient documentation

## 2021-07-26 DIAGNOSIS — R42 Dizziness and giddiness: Secondary | ICD-10-CM | POA: Diagnosis not present

## 2021-07-26 DIAGNOSIS — I10 Essential (primary) hypertension: Secondary | ICD-10-CM | POA: Diagnosis not present

## 2021-07-26 DIAGNOSIS — R531 Weakness: Secondary | ICD-10-CM | POA: Diagnosis not present

## 2021-07-26 LAB — CBC WITH DIFFERENTIAL/PLATELET
Abs Immature Granulocytes: 0.03 10*3/uL (ref 0.00–0.07)
Basophils Absolute: 0 10*3/uL (ref 0.0–0.1)
Basophils Relative: 0 %
Eosinophils Absolute: 0.1 10*3/uL (ref 0.0–0.5)
Eosinophils Relative: 2 %
HCT: 35.9 % — ABNORMAL LOW (ref 39.0–52.0)
Hemoglobin: 10.8 g/dL — ABNORMAL LOW (ref 13.0–17.0)
Immature Granulocytes: 1 %
Lymphocytes Relative: 17 %
Lymphs Abs: 0.8 10*3/uL (ref 0.7–4.0)
MCH: 28.4 pg (ref 26.0–34.0)
MCHC: 30.1 g/dL (ref 30.0–36.0)
MCV: 94.5 fL (ref 80.0–100.0)
Monocytes Absolute: 0.7 10*3/uL (ref 0.1–1.0)
Monocytes Relative: 15 %
Neutro Abs: 3.3 10*3/uL (ref 1.7–7.7)
Neutrophils Relative %: 65 %
Platelets: 272 10*3/uL (ref 150–400)
RBC: 3.8 MIL/uL — ABNORMAL LOW (ref 4.22–5.81)
RDW: 13.2 % (ref 11.5–15.5)
WBC: 5 10*3/uL (ref 4.0–10.5)
nRBC: 0 % (ref 0.0–0.2)

## 2021-07-26 LAB — BASIC METABOLIC PANEL
Anion gap: 6 (ref 5–15)
BUN: 57 mg/dL — ABNORMAL HIGH (ref 6–20)
CO2: 22 mmol/L (ref 22–32)
Calcium: 8.4 mg/dL — ABNORMAL LOW (ref 8.9–10.3)
Chloride: 107 mmol/L (ref 98–111)
Creatinine, Ser: 2.7 mg/dL — ABNORMAL HIGH (ref 0.61–1.24)
GFR, Estimated: 27 mL/min — ABNORMAL LOW (ref >60)
Glucose, Bld: 163 mg/dL — ABNORMAL HIGH (ref 70–99)
Potassium: 4.5 mmol/L (ref 3.5–5.1)
Sodium: 135 mmol/L (ref 135–145)

## 2021-07-26 NOTE — ED Provider Triage Note (Signed)
Emergency Medicine Provider Triage Evaluation Note ? ?Troy Rush , a 51 y.o. male requiring ASL interpretation was evaluated in triage.  Pt complains of sudden onset dizziness while he was at work earlier today.  Patient states that he suddenly felt very lightheaded and fell down at work.  Per EMS report, he was hypotensive upon arrival which has improved by the time he arrived here to the ED.  Patient describes his dizziness as lightheadedness and denies room spinning.  He denies other focal deficits to include unilateral weakness, numbness and tingling or ataxia.  He notes that he ate a banana and some soup today.  He denies chest pain, shortness of breath, abdominal pain, nausea, vomiting and diarrhea.  No fevers. ? ?Review of Systems  ?Positive: As above ?Negative:  ? ?Physical Exam  ?BP 114/64 (BP Location: Left Arm)   Pulse 73   Temp 97.8 ?F (36.6 ?C) (Oral)   Resp 18   SpO2 99%  ?Gen:   Awake, no distress   ?Resp:  Normal effort  ?MSK:   Moves extremities without difficulty  ?Other:  Speech is clear, able to follow commands ?CN III-XII intact ?Normal strength in upper and lower extremities bilaterally including dorsiflexion and plantar flexion, strong and equal grip strength ?Sensation normal to light and sharp touch ?Moves extremities without ataxia, coordination intact ?Normal finger to nose and rapid alternating movements ?No pronator drift ? ? ?Medical Decision Making  ?Medically screening exam initiated at 4:12 PM.  Appropriate orders placed.  Troy Rush was informed that the remainder of the evaluation will be completed by another provider, this initial triage assessment does not replace that evaluation, and the importance of remaining in the ED until their evaluation is complete. ? ? ?  ?Tonye Pearson, [place] ?07/26/21 1613 ? ?

## 2021-07-26 NOTE — ED Triage Notes (Signed)
Pt arrived via EMS, from work, became dizzy. Hypotension on EMS arrival.  ? ?110s/70s last pressure.  ? ?18 L AC ?

## 2021-07-26 NOTE — Telephone Encounter (Signed)
Called and was unable to leave patient a voice message  to confirm/remind patient of their appointment at the [facility] on 07/27/21 ? ? ? ?

## 2021-07-27 ENCOUNTER — Encounter (HOSPITAL_COMMUNITY): Payer: Self-pay

## 2021-07-27 ENCOUNTER — Other Ambulatory Visit (HOSPITAL_COMMUNITY): Payer: Self-pay

## 2021-07-27 ENCOUNTER — Ambulatory Visit (HOSPITAL_COMMUNITY)
Admission: RE | Admit: 2021-07-27 | Discharge: 2021-07-27 | Disposition: A | Discharge status: Home / Self Care | Payer: Medicare Other | Source: Ambulatory Visit | Attending: Cardiology | Admitting: Cardiology

## 2021-07-27 VITALS — BP 132/78 | HR 65 | Wt 241.0 lb

## 2021-07-27 DIAGNOSIS — I130 Hypertensive heart and chronic kidney disease with heart failure and stage 1 through stage 4 chronic kidney disease, or unspecified chronic kidney disease: Secondary | ICD-10-CM | POA: Insufficient documentation

## 2021-07-27 DIAGNOSIS — I428 Other cardiomyopathies: Secondary | ICD-10-CM | POA: Insufficient documentation

## 2021-07-27 DIAGNOSIS — Z8673 Personal history of transient ischemic attack (TIA), and cerebral infarction without residual deficits: Secondary | ICD-10-CM | POA: Diagnosis not present

## 2021-07-27 DIAGNOSIS — Z7901 Long term (current) use of anticoagulants: Secondary | ICD-10-CM | POA: Insufficient documentation

## 2021-07-27 DIAGNOSIS — E1122 Type 2 diabetes mellitus with diabetic chronic kidney disease: Secondary | ICD-10-CM | POA: Insufficient documentation

## 2021-07-27 DIAGNOSIS — E785 Hyperlipidemia, unspecified: Secondary | ICD-10-CM | POA: Insufficient documentation

## 2021-07-27 DIAGNOSIS — I5022 Chronic systolic (congestive) heart failure: Secondary | ICD-10-CM | POA: Insufficient documentation

## 2021-07-27 DIAGNOSIS — I480 Paroxysmal atrial fibrillation: Secondary | ICD-10-CM | POA: Insufficient documentation

## 2021-07-27 DIAGNOSIS — N184 Chronic kidney disease, stage 4 (severe): Secondary | ICD-10-CM | POA: Diagnosis not present

## 2021-07-27 DIAGNOSIS — Z86711 Personal history of pulmonary embolism: Secondary | ICD-10-CM | POA: Insufficient documentation

## 2021-07-27 DIAGNOSIS — G4733 Obstructive sleep apnea (adult) (pediatric): Secondary | ICD-10-CM | POA: Diagnosis not present

## 2021-07-27 DIAGNOSIS — Z79899 Other long term (current) drug therapy: Secondary | ICD-10-CM | POA: Insufficient documentation

## 2021-07-27 DIAGNOSIS — H9190 Unspecified hearing loss, unspecified ear: Secondary | ICD-10-CM | POA: Insufficient documentation

## 2021-07-27 DIAGNOSIS — Z86718 Personal history of other venous thrombosis and embolism: Secondary | ICD-10-CM | POA: Diagnosis not present

## 2021-07-27 DIAGNOSIS — Z95810 Presence of automatic (implantable) cardiac defibrillator: Secondary | ICD-10-CM | POA: Insufficient documentation

## 2021-07-27 NOTE — Progress Notes (Signed)
? ?Advanced Heart Failure Clinic Note  ? ?PCP: Kathe Becton, FNP ?HF Cardiology: Dr. Aundra Dubin ? ?Reason for Visit: f/u for chronic systolic heart failure  ? ?[name] is a 51 y.o. with a history of with deafness, HTN, DM, and chronic systolic HF.  ? ?Patient had no known cardiac problems until 10/16.  At that time, he developed exertional dyspnea and was admitted to a hospital in [place], [place] with acute systolic CHF.  Echo 10/16 showed EF 15-20%.  Cardiac cath showed no significant coronary disease.  He was diuresed and started on cardiac meds. Subsequently, he moved to [facility].  Now working in a [employer].  Repeat echo in 5/18 showed persistently low EF, 25-30%.  He had Elfrida placed.  ? ?He was admitted briefly with CHF exacerbation in 8/20.   ? ?Echo in 9/20 showed EF 30-35% with diffuse hypokinesis.  ? ?Ultrasound in 4/21 showed right leg DVT and CT showed a PE.  No obvious trigger (no long trip, no surgery, no prolonged immobility).  He was started on apixaban.  ? ?He quit taking his meds in 8/21 and was drinking heavily due to depression, later restarted meds.  In 10/21, he was admitted with DKA and atrial fibrillation/RVR (1st known episode).  He converted to NSR spontaneously.  [name] was stopped due to AKI.  He had melena/hematochezia and EGD showed gastritis/esophagitis.  Apixaban was stopped.  He went home but then was re-admitted later in 10/21 with bilateral PEs.  ? ?Echo in 10/21 showed EF 45%, mild global hypokinesis, moderate LVH, mildly decreased RV systolic function.  ? ? On 05/05/20, he had a near-syncopal event and went to the ER, no events on ICD interrogation.  ?Orthostatic.  No further dizziness/lightheadedness. ? ?Repeat RHC (2/22) showed optimized filling pressures, mild pulmonary hypertension, preserved cardiac output. Suspect elevated PA pressure may be related to OSA.   ? ?Admitted 0/9735  With A/C systolic heart failure. Diuresed with IV lasix and transitioned to  lasix 60 mg daily. Discharge weight 232 pounds.  ? ?09/10/20 he presented for scheduled cardiomems and was noted to be in A fib RVR. RHC with succesful cardiomems implant. Stable hemodynamics on cath. Admitted from cath lab with A fib RVR and started on amio drip. Chemically converted to NSR was transitioned to amio 200 mg bid.  Also incidentally found to be COVID +.  ? ?09/30/20, he was admitted w/ acute subcortical CVA. Unable to get cMRI given ICD. Neurology felt related to small vessel disease. Eliquis continued. He was d/c to CIR for rehab.  Echo in 6/22 showed EF 35-40%.   ? ?He had atrial fibrillation ablation in 11/22.  ? ?Recent admit 3/22 for chest pain and dyspnea, in the setting of hypertensive urgency. Chest CT negative for dissection and PE. Found to be in acute CHF. Hospitalization c/b AKI felt due to CIN. SCr bumped to 4 (BL ~3) diuretics were briefly held and SCr showed downward trend. Antihypertensive regimen was increased further. Hydralazine was increased to 100 mg tid and Imdur to 90 mg daily. Amlodipine was also prescribed. PO diuretics resumed.  ? ?After d/c, pt continued to take meds in his pill packs he had prior to his admission, including lower doses of hydralazine and Imdur. He did however take first dose of amlodipine yesterday morning prior to work and developed dizziness and near syncope. Went to ED but did not stay for provider eval due to long wait time. His labs showed improving AKI w/ SCr  down from 2.9>>2.70. Hgb was stable at 10.8. He reports dizziness resolved by the time he left.  ? ?He presents today for post hospital f/u. Here w/ sign language interpreter and paramedicine Veterinary surgeon). Reports feeling better. Breathing much improved. No further dizziness. BP stable today 132/78. Took all of his AM meds in his pill pack today. Did not take amlodipine. Volume status stable on exam as well as through CardioMEMs and on device interrogation. CardioMems dPAP today was 22 which is his goal.  Heart logic score also good at 10 today.  ? ? ?Cardiomems 22, this is at goal. ?Device interrogation. HL Score 10. No VT  ? ?ECG (personally reviewed): Not performed  ? ?Labs (9/17): BNP 477, K 4.5 => 4.2, creatinine 0.92 => 1.1, BNP 1009, HIV negative, SPEP negative.  ?Labs (10/17): K 5.2, creatinine 1.10, hgb 12.5 ?Labs (03/28/2016): K 5.3 Creatinine 1.07  ?Labs (3/18): digoxin < 0.2, K 4.1, creatinine 0.97 ?Labs (9/18): hgb 12.2, K 4, creatinine 0.99, BNP 139 ?Labs (1/19): hgb 11.2, K 4.8, creatinine 1.26 ?Labs (8/20): K 3.7, creatinine 1.69 ?Labs (5/21): K 5, creatinine 1.4, LDL 192 ?Labs (10/21): K 4.1, creatinine 1.77, LDL 180, TGs 138 ?Labs (11/21): K 4.5, creatinine 2.68 ?Labs (12/21): K 5.1, creatinine 2.25 ?Labs (04/30/20): K 4.7, creatinine 2.7 ?Labs (05/05/20): K 4.0, creatinine 2.15 ?Labs (05/11/20): K 4.2, creatinine 2.43, LDL 76, HDL 38, TGs 220 ?Labs (2/22): K 4.4, creatinine 2.69 ?Labs (2/22): SCr 2.56, GFR 29, K 3.9  ?Labs (8/22): K 5.0, creatinine 2.84, LDL 205, HDL 42, TGs 384 ?Labs (9/22): K 4.4, creatinine 3.06 ?Labs (1/23): K 4.5, creatinine 3.19 ?Labs (3/23): Hospital SCr 2.1>2.9>4.1>4.4>3.5>2.87>2.7 (felt CIN)  ? ?PMH: ?1. HTN ?2. Diabetes ?3. Hyperlipidemia ?4. Deafness since age 53: Needs sign language interpreter.  ?5. Chronic systolic CHF: Nonischemic cardiomyopathy.  Diagnosis 10/16 in [place], [place] (admitted with acute systolic CHF).  SPEP and HIV negative.   [place].  ?- Echo (10/16): EF 15-20%, mild LVH, mild MR, normal RV size and systolic function.  ?- LHC (10/16): No significant CAD.  ?- CPX (12/17): Mild functional limitation.  ?- 04/2016 CMRI: EF 27%. RV normal. No evidence of infiltrative disease, myocarditis, or MI.  ?- Echo (5/18): EF 25-30%, mild LV dilation, mild LVH, normal RV size with mildly decreased ?- Echo (9/20): EF 30-35%, mild LVH, diffuse hypokinesis, normal RV size and systolic function.  ?- Echo (10/21): EF 45%, mild global hypokinesis, moderate LVH,  mildly decreased RV systolic function.  ?- RHC (2/22): Optimized filling pressures, mild pulmonary hypertension, preserved cardiac output. Continue current diuretics.  Suspect elevated PA pressure may be related to OSA.   ?- Echo (6/22): EF 35-40%, global hypokinesis, normal RV.  ?- Cardiomems ?6. Depression. ?7. Colitis episodes.  ?8. CKD stage 4  ?9. DVT/PE in 4/21: No obvious trigger.  ?- Recurrent PE in 10/21, had been off anticoagulation.  ?10. Atrial fibrillation: Paroxysmal.  ?- Atrial fibrillation ablation in 11/22.  ?11. Gastritis/esophagitis: 10/21.  ?12. ETOH abuse ?13. OSA: Cpap ?14. CVA 6/22 ? ?SH: From [place] originally.  Lives in [place] until 2017, then moved to [place].  Nonsmoker.  Unemployed.  H/o ETOH abuse.   ? ?FH: No known cardiac disease.  ? ?ROS: All systems reviewed and negative except as per HPI.  ? ?Current Outpatient Medications  ?Medication Sig Dispense Refill  ? acetaminophen (TYLENOL) 325 MG tablet Take 2 tablets (650 mg total) by mouth every 4 (four) hours as needed for mild pain (or temp >  37.5 C (99.5 F)).    ? apixaban (ELIQUIS) 5 MG TABS tablet TAKE 1 TABLET(5 MG) BY MOUTH TWICE DAILY 60 tablet 11  ? atorvastatin (LIPITOR) 80 MG tablet Take 1 tablet (80 mg total) by mouth at bedtime. 30 tablet 11  ? carvedilol (COREG) 25 MG tablet TAKE 1 TABLET (25 MG TOTAL) BY MOUTH 2 (TWO) TIMES DAILY. 60 tablet 0  ? Cholecalciferol (D3-1000 PO) Take 1,000 Units by mouth daily.    ? dapagliflozin propanediol (FARXIGA) 10 MG TABS tablet Take 1 tablet (10 mg total) by mouth daily before breakfast. 90 tablet 3  ? Evolocumab (REPATHA SURECLICK) 085 MG/ML SOAJ Inject 140 mg into the skin every 14 (fourteen) days. 2 mL 11  ? fenofibrate 54 MG tablet Take 1 tablet (54 mg total) by mouth daily. 30 tablet 6  ? furosemide (LASIX) 80 MG tablet Take 1 tablet (80 mg total) by mouth 2 (two) times daily. 90 tablet 3  ? hydrALAZINE (APRESOLINE) 50 MG tablet Take 50 mg by mouth 3 (three) times daily.    ?  insulin glargine (LANTUS SOLOSTAR) 100 UNIT/ML Solostar Pen Inject 10 Units into the skin at bedtime. 9 mL 3  ? Insulin Pen Needle (PEN NEEDLES) 31G X 6 MM MISC 1 pen by Does not apply route daily. 100 each 11

## 2021-07-27 NOTE — Patient Instructions (Signed)
It was great to see you today! ?No medication changes are needed at this time. ? ? ? ?Your physician has requested that you have an echocardiogram. Echocardiography is a painless test that uses sound waves to create images of your heart. It provides your doctor with information about the size and shape of your heart and how well your heart?s chambers and valves are working. This procedure takes approximately one hour. There are no restrictions for this procedure. ? ? ?Do the following things EVERYDAY: ?Weigh yourself in the morning before breakfast. Write it down and keep it in a log. ?Take your medicines as prescribed ?Eat low salt foods--Limit salt (sodium) to 2000 mg per day.  ?Stay as active as you can everyday ?Limit all fluids for the day to less than 2 liters ? ? ?At the [facility], you and your health needs are our priority. As part of our continuing mission to provide you with exceptional heart care, we have created designated Provider Care Teams. These Care Teams include your primary Cardiologist (physician) and Advanced Practice Providers (APPs- Physician Assistants and Nurse Practitioners) who all work together to provide you with the care you need, when you need it.  ? ?You may see any of the following providers on your designated Care Team at your next follow up: ?Dr Glori Bickers ?Dr Loralie Champagne ?Darrick Grinder, NP ?Lyda Jester, PA ?Jessica Milford,NP ?Marlyce Huge, PA ?Audry Riles, PharmD ? ? ?Please be sure to bring in all your medications bottles to every appointment.  ? ?

## 2021-07-27 NOTE — Progress Notes (Signed)
Paramedicine Encounter ? ? ?Patient ID: Troy Rush , male,   DOB: Jan 06, 1971,50 y.o.,  MRN: [id number] ? ? ?Met patient in clinic today with provider.  ? ?Weight @ clinic-241 ?B/p-132/78 ?P-65 ?Sp02-98 ? ? ?He was recently d/c from admission on 4/13. He went to ER yesterday but left before being seen for dizziness.  ?EMS p/u due to him being very dizzy. He had a fall in the bathroom.  ?His PCP never sent refills for the meclizine when pharmacy asked for it several times, he goes to see her tomor and asked him to request it back.  ?He has not started the med changes from when he was d/c from hosp.  ? ?He did just say take 1 of the amlodipine pills yesterday prior to his dizzy episode.  ? ?Fluid standpoint-looks good today.  ? ?Cardiomems today was 22%.  ?Device checked today and it showed good level.  ?ECHO is needed.  ?Due to his dizziness and b/p today is stable- I will check on him next week and see how he is feeling. And if systolic is over 123 then he can add the amlodipine.  ?Will get him to check b/p at home too.  ?He needs the repatha. It was not delivered like it was suppose to.  ?I will speak with pharmacy to get this handled.  ?If they cant deliver today then I will go by and p/u and take to him.  ? ? ?[name], [place] ?[phone] ?07/27/2021 ? ? ? ? ? ?

## 2021-07-27 NOTE — Progress Notes (Signed)
P/u pts repatha from pharmacy b/c the delivery driver had already left the afternoon and pt is working tomor afternoon, so I took it to him.  ? ?Troy Rush, [place]  ?[phone] ?07/27/2021 ? ?

## 2021-07-28 ENCOUNTER — Encounter: Payer: Self-pay | Admitting: Nurse Practitioner

## 2021-07-28 ENCOUNTER — Ambulatory Visit (INDEPENDENT_AMBULATORY_CARE_PROVIDER_SITE_OTHER): Payer: Medicare Other | Admitting: Nurse Practitioner

## 2021-07-28 VITALS — BP 123/80 | HR 75 | Temp 97.9°F | Ht 70.0 in | Wt 237.0 lb

## 2021-07-28 DIAGNOSIS — G4733 Obstructive sleep apnea (adult) (pediatric): Secondary | ICD-10-CM

## 2021-07-28 DIAGNOSIS — E118 Type 2 diabetes mellitus with unspecified complications: Secondary | ICD-10-CM

## 2021-07-28 DIAGNOSIS — Z6834 Body mass index (BMI) 34.0-34.9, adult: Secondary | ICD-10-CM

## 2021-07-28 DIAGNOSIS — E6609 Other obesity due to excess calories: Secondary | ICD-10-CM | POA: Diagnosis not present

## 2021-07-28 DIAGNOSIS — R42 Dizziness and giddiness: Secondary | ICD-10-CM

## 2021-07-28 DIAGNOSIS — Z794 Long term (current) use of insulin: Secondary | ICD-10-CM | POA: Diagnosis not present

## 2021-07-28 LAB — POCT GLYCOSYLATED HEMOGLOBIN (HGB A1C)
HbA1c POC (<> result, manual entry): 6.4 % (ref 4.0–5.6)
HbA1c, POC (controlled diabetic range): 6.4 % (ref 0.0–7.0)
HbA1c, POC (prediabetic range): 6.4 % (ref 5.7–6.4)
Hemoglobin A1C: 6.4 % — AB (ref 4.0–5.6)

## 2021-07-28 MED ORDER — MECLIZINE HCL 50 MG PO TABS: 25.0000 mg | ORAL_TABLET | Freq: Three times a day (TID) | ORAL | 0 refills | Status: DC | PRN

## 2021-07-28 NOTE — Progress Notes (Signed)
? ?[facility] ?[place], [place]  [address] ?Phone:  [phone]   Fax:  [phone] ?Subjective:  ? Patient ID: Troy Rush, male    DOB: Sep 02, 1970, 51 y.o.   MRN: [id number] ? ?Chief Complaint  ?Patient presents with  ? Follow-up  ?  Patient is here today for his 3 month follow up visit and to discuss his hospital visit he had on 07/26/21. Patient was seen in the ED for dizziness and hypotension. Patient states his blood sugar levels have been elevated as well. Patient states that he feels better today then he did when he went to the hospital.  ? ?HPI ?Troy Rush 51 y.o. male  has a past medical history of AICD (automatic cardioverter/defibrillator) present (10/14/2016), Bilateral deafness, Bronchitis, CHF (congestive heart failure) ([place]), Depression, Diabetes mellitus without complication ([place]), DVT, lower extremity ([place]) (07/12/2019), Dyslipidemia, ESRD (end stage renal disease) ([place]), Headache (08/2019), Hypertension, ICD (implantable cardioverter-defibrillator) in place, Insomnia (08/2019), NICM (nonischemic cardiomyopathy) ([place]) (09/15/2016), Pancreatitis, Renal disorder, Sleep apnea, and Vitamin D deficiency (08/2019). To the [facility] for follow up visit s/p ED visit on 4/17.  ? ?Went to the ED on 4/17 for dizziness, was transported via EMS due to symptoms occurring while at work.  Endorses having some nausea with dizziness. Suspects symptoms related to drop in blood pressure.  ? ?Also endorses going to the ED on 4/8 for upset stomach and shortness of breath. Was admitted for 3-4 days, since discharged from the hospital symptoms have resolved. ? ?Was recently evaluated by cardiology and informed to tell his PCP to prescribe something for dizziness ? ?Currently has not been taking BG at home, awaiting PA for Freestyle Meter. ? 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  ? ?Currently eating a balanced diet, but denies adhering to any  exercise regimen. Walks regularly at work.  ? ?Requesting additional sleep study and replacement of CPAP machine. Denies any other concerns today. ? ?Denies any fatigue, chest pain, shortness of breath, HA or dizziness. Denies any blurred vision, numbness or tingling. ? ?Visit completed with the assistance of in person sign language interpreter. ? ?Past Medical History:  ?Diagnosis Date  ? AICD (automatic cardioverter/defibrillator) present 10/14/2016  ? Bilateral deafness   ? Bronchitis   ? CHF (congestive heart failure) ([place])   ? Depression   ? Diabetes mellitus without complication ([place])   ? DVT, lower extremity ([place]) 07/12/2019  ? Dyslipidemia   ? ESRD (end stage renal disease) ([place])   ? Reports stage IV CKD, Has not started dialysis d/t inability to communicate w/ nephrologist on his first appointment  ? Headache 08/2019  ? Hypertension   ? ICD (implantable cardioverter-defibrillator) in place   ? Insomnia 08/2019  ? NICM (nonischemic cardiomyopathy) ([place]) 09/15/2016  ? Pancreatitis   ? Renal disorder   ? Sleep apnea   ? Vitamin D deficiency 08/2019  ? ? ?Past Surgical History:  ?Procedure Laterality Date  ? ATRIAL FIBRILLATION ABLATION N/A 03/02/2021  ? Procedure: ATRIAL FIBRILLATION ABLATION;  Surgeon: Constance Haw, MD;  Location: [facility];  Service: Cardiovascular;  Laterality: N/A;  ? BACK SURGERY    ? CARDIAC CATHETERIZATION    ? ESOPHAGOGASTRODUODENOSCOPY (EGD) WITH PROPOFOL N/A 02/02/2020  ? Procedure: ESOPHAGOGASTRODUODENOSCOPY (EGD) WITH PROPOFOL;  Surgeon: Arta Silence, MD;  Location: [facility];  Service: Endoscopy;  Laterality: N/A;  ? ICD IMPLANT  10/14/2016  ? ICD IMPLANT N/A 10/14/2016  ?  Procedure: ICD Implant;  Surgeon: Deboraha Sprang, MD;  Location: [facility];  Service: Cardiovascular;  Laterality: N/A;  ? PRESSURE SENSOR/CARDIOMEMS N/A 09/10/2020  ? Procedure: PRESSURE SENSOR/CARDIOMEMS;  Surgeon: Larey Dresser, MD;  Location: [facility];  Service:  Cardiovascular;  Laterality: N/A;  ? RIGHT HEART CATH N/A 05/21/2020  ? Procedure: RIGHT HEART CATH;  Surgeon: Larey Dresser, MD;  Location: [facility];  Service: Cardiovascular;  Laterality: N/A;  ? WRIST SURGERY    ? ? ?Family History  ?Problem Relation Age of Onset  ? Hypertension Mother   ? Healthy Father   ? Stroke Father   ? ? ?Social History  ? ?Socioeconomic History  ? Marital status: Legally Separated  ?  Spouse name: Not on file  ? Number of children: Not on file  ? Years of education: Not on file  ? Highest education level: Not on file  ?Occupational History  ? Not on file  ?Tobacco Use  ? Smoking status: Never  ? Smokeless tobacco: Never  ?Vaping Use  ? Vaping Use: Never used  ?Substance and Sexual Activity  ? Alcohol use: Not Currently  ?  Alcohol/week: 1.0 - 2.0 standard drink  ?  Types: 1 - 2 Glasses of wine per week  ?  Comment: rare  ? Drug use: No  ? Sexual activity: Yes  ?  Birth control/protection: None  ?Other Topics Concern  ? Not on file  ?Social History Narrative  ? Not on file  ? ?Social Determinants of Health  ? ?Financial Resource Strain: Not on file  ?Food Insecurity: Not on file  ?Transportation Needs: Not on file  ?Physical Activity: Not on file  ?Stress: Not on file  ?Social Connections: Not on file  ?Intimate Partner Violence: Not on file  ? ? ?Outpatient Medications Prior to Visit  ?Medication Sig Dispense Refill  ? acetaminophen (TYLENOL) 325 MG tablet Take 2 tablets (650 mg total) by mouth every 4 (four) hours as needed for mild pain (or temp > 37.5 C (99.5 F)).    ? apixaban (ELIQUIS) 5 MG TABS tablet TAKE 1 TABLET(5 MG) BY MOUTH TWICE DAILY 60 tablet 11  ? atorvastatin (LIPITOR) 80 MG tablet Take 1 tablet (80 mg total) by mouth at bedtime. 30 tablet 11  ? carvedilol (COREG) 25 MG tablet TAKE 1 TABLET (25 MG TOTAL) BY MOUTH 2 (TWO) TIMES DAILY. 60 tablet 0  ? Cholecalciferol (D3-1000 PO) Take 1,000 Units by mouth daily.    ? dapagliflozin propanediol (FARXIGA) 10 MG TABS  tablet Take 1 tablet (10 mg total) by mouth daily before breakfast. 90 tablet 3  ? fenofibrate 54 MG tablet Take 1 tablet (54 mg total) by mouth daily. 30 tablet 6  ? furosemide (LASIX) 80 MG tablet Take 1 tablet (80 mg total) by mouth 2 (two) times daily. 90 tablet 3  ? hydrALAZINE (APRESOLINE) 50 MG tablet Take 50 mg by mouth 3 (three) times daily.    ? insulin glargine (LANTUS SOLOSTAR) 100 UNIT/ML Solostar Pen Inject 10 Units into the skin at bedtime. 9 mL 3  ? Insulin Pen Needle (PEN NEEDLES) 31G X 6 MM MISC 1 pen by Does not apply route daily. 100 each 11  ? isosorbide mononitrate (IMDUR) 60 MG 24 hr tablet Take 60 mg by mouth daily.    ? latanoprost (XALATAN) 0.005 % ophthalmic solution Place 1 drop into both eyes at bedtime.    ? metolazone (ZAROXOLYN) 2.5 MG  tablet Take 1 tablet (2.5 mg total) by mouth as directed. Per Heart Failure Team instructions. 6 tablet 3  ? pantoprazole (PROTONIX) 40 MG tablet Take 1 tablet (40 mg total) by mouth 2 (two) times daily. 180 tablet 3  ? sertraline (ZOLOFT) 50 MG tablet Take 1 tablet (50 mg total) by mouth daily. 90 tablet 1  ? VASCEPA 1 g capsule Take 2 capsules (2 g total) by mouth 2 (two) times daily. 120 capsule 5  ? Continuous Blood Gluc Sensor (FREESTYLE LIBRE 3 SENSOR) MISC 1 application by Does not apply route every 14 (fourteen) days. Place 1 sensor on the skin every 14 days. Use to check glucose continuously (Patient not taking: Reported on 07/27/2021) 2 each 5  ? Evolocumab (REPATHA SURECLICK) 183 MG/ML SOAJ Inject 140 mg into the skin every 14 (fourteen) days. (Patient not taking: Reported on 07/28/2021) 2 mL 11  ? ?No facility-administered medications prior to visit.  ? ? ?Allergies  ?Allergen Reactions  ? Lisinopril Cough  ? ? ?Review of Systems  ?Constitutional:  Negative for chills, fever and malaise/fatigue.  ?HENT: Negative.    ?Eyes: Negative.   ?Respiratory:  Positive for shortness of breath. Negative for cough.   ?Cardiovascular:  Negative for chest  pain, palpitations and leg swelling.  ?Gastrointestinal:  Positive for abdominal pain. Negative for blood in stool, constipation, diarrhea, nausea and vomiting.  ?Skin: Negative.   ?Neurological:  Positive fo

## 2021-07-28 NOTE — Patient Instructions (Addendum)
You were seen today in the [facility] for follow up visit after evaluation at ED. Labs were collected, results will be available via MyChart or, if abnormal, you will be contacted by clinic staff. You were prescribed medications, please take as directed. Please follow up in 3 mths for wellness visit  ?

## 2021-07-29 LAB — LIPID PANEL
Chol/HDL Ratio: 3 ratio (ref 0.0–5.0)
Cholesterol, Total: 136 mg/dL (ref 100–199)
HDL: 45 mg/dL (ref >39)
LDL Chol Calc (NIH): 53 mg/dL (ref 0–99)
Triglycerides: 242 mg/dL — ABNORMAL HIGH (ref 0–149)
VLDL Cholesterol Cal: 38 mg/dL (ref 5–40)

## 2021-08-04 ENCOUNTER — Other Ambulatory Visit (HOSPITAL_COMMUNITY): Payer: Self-pay

## 2021-08-04 NOTE — Progress Notes (Signed)
Came out today for b/p check at 1145.  ? ?Pt has not been checking bp at home.  ? ?Today on his machine was 143/81-yesterday it showed his battery was dying in the bp machine so he has not been checking it.  ? ? ?Took meds around 8am today.  ? ?Initial b/p 154/92--he was up walking around prior to this-  ?B/p after a few more min of rest 134/80.  ? ?Pharmacy needs the dosage of his hydralazine 50mg  and isosobide 60mg  instead of the increased dosages from his admission since his b/p are still ok. Sent message for that as well.  ?I will go back out next week and recheck as well.  ? ?He will also get new batteries to use in his bp machine at home.  ? ?[name], [place]  ?[phone] ?08/05/2021 ? ? ? ? ?  ? ? ?

## 2021-08-05 ENCOUNTER — Other Ambulatory Visit (HOSPITAL_COMMUNITY): Payer: Self-pay

## 2021-08-05 MED ORDER — ISOSORBIDE MONONITRATE ER 60 MG PO TB24: 60.0000 mg | ORAL_TABLET | Freq: Every day | ORAL | 3 refills | Status: DC

## 2021-08-05 MED ORDER — HYDRALAZINE HCL 50 MG PO TABS
50.0000 mg | ORAL_TABLET | Freq: Three times a day (TID) | ORAL | 3 refills | Status: AC
Start: 2021-08-05 — End: ?

## 2021-08-10 NOTE — Telephone Encounter (Signed)
Prior Authorization for Bipap titration sent to Medicare. No PA REQUIRED ?

## 2021-08-10 NOTE — Addendum Note (Signed)
Addended by: Freada Bergeron on: 08/10/2021 10:32 AM ? ? Modules accepted: Orders ? ?

## 2021-08-10 NOTE — Telephone Encounter (Addendum)
From: Sueanne Margarita, MD  ?Sent: 06/29/2021  11:37 AM EDT  ?To: Cv Div Sleep Studies  ? ?Please let patient know that they have sleep apnea.  Recommend therapeutic BiPAP titration for treatment of patient's severe sleep disordered breathing as he did not tolerate CPAP and not enough time for adequate BiPAP titration   ? ? ?PER dpr the patient's daughter has been notified of the result and she verbalized understanding.  All questions (if any) were answered. Liatu will communicate with her father the results. ?Marolyn Hammock, [place] 08/10/2021 10:22 AM   ? ?Will precert Bipap titration ?

## 2021-08-11 ENCOUNTER — Telehealth (HOSPITAL_COMMUNITY): Payer: Self-pay

## 2021-08-12 ENCOUNTER — Emergency Department (HOSPITAL_COMMUNITY): Payer: Medicare Other

## 2021-08-12 ENCOUNTER — Encounter (HOSPITAL_COMMUNITY): Payer: Self-pay

## 2021-08-12 ENCOUNTER — Emergency Department (HOSPITAL_COMMUNITY)
Admission: EM | Admit: 2021-08-12 | Discharge: 2021-08-12 | Disposition: A | Discharge status: Home / Self Care | Payer: Medicare Other | Attending: Emergency Medicine | Admitting: Emergency Medicine

## 2021-08-12 ENCOUNTER — Other Ambulatory Visit: Payer: Self-pay

## 2021-08-12 DIAGNOSIS — Z79899 Other long term (current) drug therapy: Secondary | ICD-10-CM | POA: Diagnosis not present

## 2021-08-12 DIAGNOSIS — Z7901 Long term (current) use of anticoagulants: Secondary | ICD-10-CM | POA: Diagnosis not present

## 2021-08-12 DIAGNOSIS — Z794 Long term (current) use of insulin: Secondary | ICD-10-CM | POA: Insufficient documentation

## 2021-08-12 DIAGNOSIS — U071 COVID-19: Secondary | ICD-10-CM | POA: Insufficient documentation

## 2021-08-12 DIAGNOSIS — R0602 Shortness of breath: Secondary | ICD-10-CM | POA: Diagnosis not present

## 2021-08-12 DIAGNOSIS — N189 Chronic kidney disease, unspecified: Secondary | ICD-10-CM | POA: Diagnosis not present

## 2021-08-12 DIAGNOSIS — I130 Hypertensive heart and chronic kidney disease with heart failure and stage 1 through stage 4 chronic kidney disease, or unspecified chronic kidney disease: Secondary | ICD-10-CM | POA: Insufficient documentation

## 2021-08-12 DIAGNOSIS — R079 Chest pain, unspecified: Secondary | ICD-10-CM | POA: Diagnosis not present

## 2021-08-12 DIAGNOSIS — I509 Heart failure, unspecified: Secondary | ICD-10-CM | POA: Insufficient documentation

## 2021-08-12 DIAGNOSIS — J811 Chronic pulmonary edema: Secondary | ICD-10-CM | POA: Diagnosis not present

## 2021-08-12 LAB — BASIC METABOLIC PANEL
Anion gap: 9 (ref 5–15)
BUN: 49 mg/dL — ABNORMAL HIGH (ref 6–20)
CO2: 27 mmol/L (ref 22–32)
Calcium: 9 mg/dL (ref 8.9–10.3)
Chloride: 102 mmol/L (ref 98–111)
Creatinine, Ser: 3.82 mg/dL — ABNORMAL HIGH (ref 0.61–1.24)
GFR, Estimated: 18 mL/min — ABNORMAL LOW (ref >60)
Glucose, Bld: 150 mg/dL — ABNORMAL HIGH (ref 70–99)
Potassium: 4.9 mmol/L (ref 3.5–5.1)
Sodium: 138 mmol/L (ref 135–145)

## 2021-08-12 LAB — RESP PANEL BY RT-PCR (FLU A&B, COVID) ARPGX2
Influenza A by PCR: NEGATIVE
Influenza B by PCR: NEGATIVE
SARS Coronavirus 2 by RT PCR: POSITIVE — AB

## 2021-08-12 LAB — CBC
HCT: 35.5 % — ABNORMAL LOW (ref 39.0–52.0)
Hemoglobin: 10.9 g/dL — ABNORMAL LOW (ref 13.0–17.0)
MCH: 28.2 pg (ref 26.0–34.0)
MCHC: 30.7 g/dL (ref 30.0–36.0)
MCV: 92 fL (ref 80.0–100.0)
Platelets: 165 10*3/uL (ref 150–400)
RBC: 3.86 MIL/uL — ABNORMAL LOW (ref 4.22–5.81)
RDW: 13.2 % (ref 11.5–15.5)
WBC: 5.2 10*3/uL (ref 4.0–10.5)
nRBC: 0 % (ref 0.0–0.2)

## 2021-08-12 LAB — TROPONIN I (HIGH SENSITIVITY): Troponin I (High Sensitivity): 15 ng/L (ref <18)

## 2021-08-12 MED ORDER — ONDANSETRON HCL 4 MG/2ML IJ SOLN
4.0000 mg | Freq: Once | INTRAMUSCULAR | Status: AC
Start: 2021-08-12 — End: 2021-08-12
  Administered 2021-08-12: 4 mg via INTRAVENOUS
  Filled 2021-08-12: qty 2

## 2021-08-12 MED ORDER — ACETAMINOPHEN 325 MG PO TABS
650.0000 mg | ORAL_TABLET | Freq: Once | ORAL | Status: AC
  Administered 2021-08-12: 650 mg via ORAL
  Filled 2021-08-12: qty 2

## 2021-08-12 MED ORDER — SODIUM CHLORIDE 0.9 % IV BOLUS
500.0000 mL | Freq: Once | INTRAVENOUS | Status: AC
  Administered 2021-08-12: 500 mL via INTRAVENOUS

## 2021-08-12 MED ORDER — MOLNUPIRAVIR EUA 200MG CAPSULE
4.0000 | ORAL_CAPSULE | Freq: Two times a day (BID) | ORAL | Status: DC
  Administered 2021-08-12: 800 mg via ORAL
  Filled 2021-08-12: qty 4

## 2021-08-12 NOTE — Telephone Encounter (Signed)
Pt reached out and advised his roommate was covid +, I advised him to steer clear if at all possible.  ? ?He reports his b/p have been in the 130's/70s at home.  ?He checked during our phone/text conversation and it was 135/72.  ? ?Will f/u next week.  ? ?[name], [place]  ?[phone] ?08/11/2021 ? ?

## 2021-08-12 NOTE — ED Provider Notes (Signed)
?[facility] DEPT ?Provider Note ? ? ?CSN: [id number] ?Arrival date & time: 08/12/21  1219 ? ?  ? ?History ? ?Chief Complaint  ?Patient presents with  ? Emesis  ? Shortness of Breath  ? Chest Pain  ? Headache  ? ? ?Troy Rush is a 51 y.o. male who is deaf, has a history of venous thrombosis on Eliquis, congestive heart failure on diuretics, chronic kidney disease, hypertension, presented to ED with constellation of symptoms.  Patient reports, using sign language interpreter, that he has felt sick for 2 days now, with headache, sore throat, nausea, poor appetite, diffuse body aches.  Has remained has COVID and he is concerned that he may have COVID.  He denies that he has having the persistent chest pain that he had on most recent hospitalization in April.  He reports no bowel movement in the past day which is unusual for him. ? ?HPI ? ?  ? ?Home Medications ?Prior to Admission medications   ?Medication Sig Start Date End Date Taking? Authorizing Provider  ?acetaminophen (TYLENOL) 325 MG tablet Take 2 tablets (650 mg total) by mouth every 4 (four) hours as needed for mild pain (or temp > 37.5 C (99.5 F)). 10/07/20   Angiulli, Lavon Paganini, PA-C  ?apixaban (ELIQUIS) 5 MG TABS tablet TAKE 1 TABLET(5 MG) BY MOUTH TWICE DAILY 10/07/20   Angiulli, Lavon Paganini, PA-C  ?atorvastatin (LIPITOR) 80 MG tablet Take 1 tablet (80 mg total) by mouth at bedtime. 10/07/20   Angiulli, Lavon Paganini, PA-C  ?carvedilol (COREG) 25 MG tablet TAKE 1 TABLET (25 MG TOTAL) BY MOUTH 2 (TWO) TIMES DAILY. 01/04/21 01/04/22  Consuelo Pandy, PA-C  ?Cholecalciferol (D3-1000 PO) Take 1,000 Units by mouth daily.    [provider]  ?Continuous Blood Gluc Sensor (FREESTYLE LIBRE 3 SENSOR) MISC 1 application by Does not apply route every 14 (fourteen) days. Place 1 sensor on the skin every 14 days. Use to check glucose continuously ?Patient not taking: Reported on 07/27/2021 04/19/21   Vevelyn Francois, NP  ?dapagliflozin  propanediol (FARXIGA) 10 MG TABS tablet Take 1 tablet (10 mg total) by mouth daily before breakfast. 10/27/20   Consuelo Pandy, PA-C  ?Evolocumab (REPATHA SURECLICK) 758 MG/ML SOAJ Inject 140 mg into the skin every 14 (fourteen) days. ?Patient not taking: Reported on 07/28/2021 12/29/20   Larey Dresser, MD  ?fenofibrate 54 MG tablet Take 1 tablet (54 mg total) by mouth daily. 07/07/21   Larey Dresser, MD  ?furosemide (LASIX) 80 MG tablet Take 1 tablet (80 mg total) by mouth 2 (two) times daily. 05/18/21   Larey Dresser, MD  ?hydrALAZINE (APRESOLINE) 50 MG tablet Take 1 tablet (50 mg total) by mouth 3 (three) times daily. 08/05/21   Larey Dresser, MD  ?insulin glargine (LANTUS SOLOSTAR) 100 UNIT/ML Solostar Pen Inject 10 Units into the skin at bedtime. 07/22/21 07/22/22  Ghimire, Henreitta Leber, MD  ?Insulin Pen Needle (PEN NEEDLES) 31G X 6 MM MISC 1 pen by Does not apply route daily. 01/15/21 01/15/22  Vevelyn Francois, NP  ?isosorbide mononitrate (IMDUR) 60 MG 24 hr tablet Take 1 tablet (60 mg total) by mouth daily. 08/05/21   Larey Dresser, MD  ?latanoprost (XALATAN) 0.005 % ophthalmic solution Place 1 drop into both eyes at bedtime. 01/20/20   [provider]  ?meclizine (ANTIVERT) 50 MG tablet Take 0.5 tablets (25 mg total) by mouth 3 (three) times daily as needed for dizziness. 07/28/21  Bo Merino I, NP  ?metolazone (ZAROXOLYN) 2.5 MG tablet Take 1 tablet (2.5 mg total) by mouth as directed. Per Heart Failure Team instructions. 02/17/21 05/25/22  Clegg, Amy D, NP  ?pantoprazole (PROTONIX) 40 MG tablet Take 1 tablet (40 mg total) by mouth 2 (two) times daily. 10/07/20   Angiulli, Lavon Paganini, PA-C  ?sertraline (ZOLOFT) 50 MG tablet Take 1 tablet (50 mg total) by mouth daily. 03/18/21 09/14/21  Vevelyn Francois, NP  ?VASCEPA 1 g capsule Take 2 capsules (2 g total) by mouth 2 (two) times daily. 10/07/20   Angiulli, Lavon Paganini, PA-C  ?   ? ?Allergies    ?Lisinopril   ? ?Review of Systems   ?Review of  Systems ? ?Physical Exam ?Updated Vital Signs ?BP (!) 173/100 (BP Location: Right Arm)   Pulse 79   Temp 98.7 ?F (37.1 ?C) (Oral)   Resp 18   Ht 5\' 10"  (1.778 m)   Wt 107.5 kg   SpO2 95%   BMI 34.01 kg/m?  ?Physical Exam ?Constitutional:   ?   General: He is not in acute distress. ?HENT:  ?   Head: Normocephalic and atraumatic.  ?Eyes:  ?   Conjunctiva/sclera: Conjunctivae normal.  ?   Pupils: Pupils are equal, round, and reactive to light.  ?Cardiovascular:  ?   Rate and Rhythm: Normal rate and regular rhythm.  ?Pulmonary:  ?   Effort: Pulmonary effort is normal. No respiratory distress.  ?Abdominal:  ?   General: There is no distension.  ?   Tenderness: There is no abdominal tenderness.  ?Skin: ?   General: Skin is warm and dry.  ?Neurological:  ?   General: No focal deficit present.  ?   Mental Status: He is alert. Mental status is at baseline.  ?Psychiatric:     ?   Mood and Affect: Mood normal.     ?   Behavior: Behavior normal.  ? ? ?ED Results / Procedures / Treatments   ?Labs ?(all labs ordered are listed, but only abnormal results are displayed) ?Labs Reviewed  ?RESP PANEL BY RT-PCR (FLU A&B, COVID) ARPGX2 - Abnormal; Notable for the following components:  ?    Result Value  ? SARS Coronavirus 2 by RT PCR POSITIVE (*)   ? All other components within normal limits  ?BASIC METABOLIC PANEL - Abnormal; Notable for the following components:  ? Glucose, Bld 150 (*)   ? BUN 49 (*)   ? Creatinine, Ser 3.82 (*)   ? GFR, Estimated 18 (*)   ? All other components within normal limits  ?CBC - Abnormal; Notable for the following components:  ? RBC 3.86 (*)   ? Hemoglobin 10.9 (*)   ? HCT 35.5 (*)   ? All other components within normal limits  ?TROPONIN I (HIGH SENSITIVITY)  ? ? ?EKG ?EKG Interpretation ? ?Date/Time:  Thursday Aug 12 2021 09:32:20 EDT ?Ventricular Rate:  78 ?PR Interval:  213 ?QRS Duration: 92 ?QT Interval:  416 ?QTC Calculation: 474 ?R Axis:   136 ?Text Interpretation: Sinus rhythm Prolonged PR  interval  No sig change from prior tracing Apr 2023 Confirmed by Octaviano Glow [phone]) on 08/12/2021 11:11:51 AM ? ?Radiology ?DG Chest 2 View ? ?Result Date: 08/12/2021 ?CLINICAL DATA:  Chest pain EXAM: CHEST - 2 VIEW COMPARISON:  07/17/2021 FINDINGS: Mild bilateral interstitial thickening. No focal consolidation. No pleural effusion or pneumothorax. Stable cardiomegaly. Single lead cardiac pacemaker. No acute osseous abnormality. IMPRESSION: 1. Cardiomegaly with mild pulmonary  vascular congestion. Electronically Signed   By: Kathreen Devoid M.D.   On: 08/12/2021 10:25   ? ?Procedures ?Procedures  ? ? ?Medications Ordered in ED ?Medications  ?sodium chloride 0.9 % bolus 500 mL (0 mLs Intravenous Stopped 08/12/21 1303)  ?ondansetron [facility]) injection 4 mg (4 mg Intravenous Given 08/12/21 1231)  ?acetaminophen (TYLENOL) tablet 650 mg (650 mg Oral Given 08/12/21 1231)  ? ? ?ED Course/ Medical Decision Making/ A&P ?Clinical Course as of 08/13/21 0806  ?Thu Aug 12, 2021  ?1455 Patient updated regarding his COVID diagnosis using sign lines or interpreter, he is a candidate for molnupiravir given his multiple comorbidities.  Onset of symptoms yesterday.  We will give him his first dose and medications to go home with.  A work note was provided [MT]  ?  ?Clinical Course User Index ?[MT] Wyvonnia Dusky, MD  ? ?                        ?Medical Decision Making ?Amount and/or Complexity of Data Reviewed ?Labs: ordered. ?Radiology: ordered. ? ?Risk ?OTC drugs. ?Prescription drug management. ? ? ?This patient presents to the ED with concern for constellation of symptoms. This involves an extensive number of treatment options, and is a complaint that carries with it a high risk of complications and morbidity.  The differential diagnosis includes viral illness including COVID-19 versus other type of infection versus ACS versus other ? ?Co-morbidities that complicate the patient evaluation: History of congestive heart disease, clotting  disorder, chronic kidney disease ? ? ?External records from outside source obtained and reviewed including recent hospitalization for chest pain and cardiology evaluation.  He has no active chest pain at this time to suggest [facility]

## 2021-08-12 NOTE — ED Triage Notes (Signed)
Patient is deaf. ? ?Patient c/o chest pain, emesis, SOB and a headache that started last night. ?

## 2021-08-18 ENCOUNTER — Other Ambulatory Visit: Payer: Self-pay

## 2021-08-18 ENCOUNTER — Observation Stay (HOSPITAL_COMMUNITY)
Admission: EM | Admit: 2021-08-18 | Discharge: 2021-08-19 | Disposition: A | Discharge status: Home / Self Care | Payer: Medicare Other | Attending: Internal Medicine | Admitting: Internal Medicine

## 2021-08-18 ENCOUNTER — Emergency Department (HOSPITAL_COMMUNITY): Payer: Medicare Other

## 2021-08-18 DIAGNOSIS — E1122 Type 2 diabetes mellitus with diabetic chronic kidney disease: Secondary | ICD-10-CM | POA: Diagnosis not present

## 2021-08-18 DIAGNOSIS — I1311 Hypertensive heart and chronic kidney disease without heart failure, with stage 5 chronic kidney disease, or end stage renal disease: Secondary | ICD-10-CM | POA: Diagnosis not present

## 2021-08-18 DIAGNOSIS — N1832 Chronic kidney disease, stage 3b: Secondary | ICD-10-CM | POA: Diagnosis present

## 2021-08-18 DIAGNOSIS — U071 COVID-19: Secondary | ICD-10-CM | POA: Diagnosis not present

## 2021-08-18 DIAGNOSIS — R079 Chest pain, unspecified: Secondary | ICD-10-CM | POA: Diagnosis not present

## 2021-08-18 DIAGNOSIS — Z86711 Personal history of pulmonary embolism: Secondary | ICD-10-CM | POA: Diagnosis not present

## 2021-08-18 DIAGNOSIS — Z6834 Body mass index (BMI) 34.0-34.9, adult: Secondary | ICD-10-CM | POA: Diagnosis not present

## 2021-08-18 DIAGNOSIS — Z7901 Long term (current) use of anticoagulants: Secondary | ICD-10-CM | POA: Insufficient documentation

## 2021-08-18 DIAGNOSIS — Z86718 Personal history of other venous thrombosis and embolism: Secondary | ICD-10-CM | POA: Insufficient documentation

## 2021-08-18 DIAGNOSIS — Z79899 Other long term (current) drug therapy: Secondary | ICD-10-CM | POA: Diagnosis not present

## 2021-08-18 DIAGNOSIS — I10 Essential (primary) hypertension: Secondary | ICD-10-CM | POA: Diagnosis not present

## 2021-08-18 DIAGNOSIS — I509 Heart failure, unspecified: Secondary | ICD-10-CM | POA: Diagnosis not present

## 2021-08-18 DIAGNOSIS — N186 End stage renal disease: Secondary | ICD-10-CM | POA: Diagnosis not present

## 2021-08-18 DIAGNOSIS — I5022 Chronic systolic (congestive) heart failure: Secondary | ICD-10-CM | POA: Insufficient documentation

## 2021-08-18 DIAGNOSIS — Z8673 Personal history of transient ischemic attack (TIA), and cerebral infarction without residual deficits: Secondary | ICD-10-CM | POA: Insufficient documentation

## 2021-08-18 DIAGNOSIS — R112 Nausea with vomiting, unspecified: Secondary | ICD-10-CM

## 2021-08-18 DIAGNOSIS — K920 Hematemesis: Secondary | ICD-10-CM | POA: Diagnosis not present

## 2021-08-18 DIAGNOSIS — Z7984 Long term (current) use of oral hypoglycemic drugs: Secondary | ICD-10-CM | POA: Insufficient documentation

## 2021-08-18 DIAGNOSIS — I480 Paroxysmal atrial fibrillation: Secondary | ICD-10-CM | POA: Insufficient documentation

## 2021-08-18 DIAGNOSIS — Z95810 Presence of automatic (implantable) cardiac defibrillator: Secondary | ICD-10-CM | POA: Insufficient documentation

## 2021-08-18 DIAGNOSIS — E669 Obesity, unspecified: Secondary | ICD-10-CM | POA: Insufficient documentation

## 2021-08-18 DIAGNOSIS — I214 Non-ST elevation (NSTEMI) myocardial infarction: Secondary | ICD-10-CM | POA: Diagnosis not present

## 2021-08-18 DIAGNOSIS — R0789 Other chest pain: Secondary | ICD-10-CM | POA: Diagnosis not present

## 2021-08-18 DIAGNOSIS — Z794 Long term (current) use of insulin: Secondary | ICD-10-CM | POA: Diagnosis not present

## 2021-08-18 DIAGNOSIS — R231 Pallor: Secondary | ICD-10-CM | POA: Diagnosis not present

## 2021-08-18 HISTORY — DX: COVID-19: U07.1

## 2021-08-18 LAB — CBC WITH DIFFERENTIAL/PLATELET
Abs Immature Granulocytes: 0.03 10*3/uL (ref 0.00–0.07)
Basophils Absolute: 0 10*3/uL (ref 0.0–0.1)
Basophils Relative: 0 %
Eosinophils Absolute: 0 10*3/uL (ref 0.0–0.5)
Eosinophils Relative: 0 %
HCT: 42.2 % (ref 39.0–52.0)
Hemoglobin: 13 g/dL (ref 13.0–17.0)
Immature Granulocytes: 1 %
Lymphocytes Relative: 16 %
Lymphs Abs: 0.9 10*3/uL (ref 0.7–4.0)
MCH: 28.3 pg (ref 26.0–34.0)
MCHC: 30.8 g/dL (ref 30.0–36.0)
MCV: 91.9 fL (ref 80.0–100.0)
Monocytes Absolute: 0.5 10*3/uL (ref 0.1–1.0)
Monocytes Relative: 10 %
Neutro Abs: 3.9 10*3/uL (ref 1.7–7.7)
Neutrophils Relative %: 73 %
Platelets: 235 10*3/uL (ref 150–400)
RBC: 4.59 MIL/uL (ref 4.22–5.81)
RDW: 13 % (ref 11.5–15.5)
WBC: 5.3 10*3/uL (ref 4.0–10.5)
nRBC: 0 % (ref 0.0–0.2)

## 2021-08-18 LAB — COMPREHENSIVE METABOLIC PANEL
ALT: 35 U/L (ref 0–44)
AST: 41 U/L (ref 15–41)
Albumin: 3.6 g/dL (ref 3.5–5.0)
Alkaline Phosphatase: 43 U/L (ref 38–126)
Anion gap: 20 — ABNORMAL HIGH (ref 5–15)
BUN: 59 mg/dL — ABNORMAL HIGH (ref 6–20)
CO2: 20 mmol/L — ABNORMAL LOW (ref 22–32)
Calcium: 9.1 mg/dL (ref 8.9–10.3)
Chloride: 99 mmol/L (ref 98–111)
Creatinine, Ser: 2.85 mg/dL — ABNORMAL HIGH (ref 0.61–1.24)
GFR, Estimated: 26 mL/min — ABNORMAL LOW (ref >60)
Glucose, Bld: 207 mg/dL — ABNORMAL HIGH (ref 70–99)
Potassium: 5.4 mmol/L — ABNORMAL HIGH (ref 3.5–5.1)
Sodium: 139 mmol/L (ref 135–145)
Total Bilirubin: 1.6 mg/dL — ABNORMAL HIGH (ref 0.3–1.2)
Total Protein: 7.9 g/dL (ref 6.5–8.1)

## 2021-08-18 LAB — TROPONIN I (HIGH SENSITIVITY)
Troponin I (High Sensitivity): 102 ng/L (ref <18)
Troponin I (High Sensitivity): 105 ng/L (ref <18)

## 2021-08-18 LAB — LIPASE, BLOOD: Lipase: 48 U/L (ref 11–51)

## 2021-08-18 LAB — BRAIN NATRIURETIC PEPTIDE: B Natriuretic Peptide: 584.2 pg/mL — ABNORMAL HIGH (ref 0.0–100.0)

## 2021-08-18 MED ORDER — ASPIRIN 325 MG PO TABS
325.0000 mg | ORAL_TABLET | Freq: Every day | ORAL | Status: DC
  Administered 2021-08-18 – 2021-08-19 (×2): 325 mg via ORAL
  Filled 2021-08-18 (×2): qty 1

## 2021-08-18 MED ORDER — LACTATED RINGERS IV BOLUS
500.0000 mL | Freq: Once | INTRAVENOUS | Status: AC
  Administered 2021-08-18: 500 mL via INTRAVENOUS

## 2021-08-18 MED ORDER — PANTOPRAZOLE SODIUM 40 MG IV SOLR
40.0000 mg | Freq: Once | INTRAVENOUS | Status: AC
  Administered 2021-08-18: 40 mg via INTRAVENOUS
  Filled 2021-08-18: qty 10

## 2021-08-18 MED ORDER — FAMOTIDINE IN NACL 20-0.9 MG/50ML-% IV SOLN
20.0000 mg | Freq: Once | INTRAVENOUS | Status: AC
  Administered 2021-08-18: 20 mg via INTRAVENOUS
  Filled 2021-08-18: qty 50

## 2021-08-18 MED ORDER — LACTATED RINGERS IV BOLUS: 1000.0000 mL | Freq: Once | INTRAVENOUS | Status: DC

## 2021-08-18 MED ORDER — ONDANSETRON HCL 4 MG/2ML IJ SOLN
4.0000 mg | Freq: Once | INTRAMUSCULAR | Status: AC
  Administered 2021-08-18: 4 mg via INTRAVENOUS
  Filled 2021-08-18: qty 2

## 2021-08-18 MED ORDER — LIDOCAINE VISCOUS HCL 2 % MT SOLN
15.0000 mL | Freq: Once | OROMUCOSAL | Status: AC
  Administered 2021-08-18: 15 mL via ORAL
  Filled 2021-08-18: qty 15

## 2021-08-18 MED ORDER — ALUM & MAG HYDROXIDE-SIMETH 200-200-20 MG/5ML PO SUSP
30.0000 mL | Freq: Once | ORAL | Status: AC
  Administered 2021-08-18: 30 mL via ORAL
  Filled 2021-08-18: qty 30

## 2021-08-18 NOTE — ED Provider Notes (Signed)
?[facility] ?Provider Note ? ?CSN: [id number] ?Arrival date & time: 08/18/21 1422 ? ?Chief Complaint(s) ?Chest Pain and Nausea ? ?HPI ?REMER COUSE is a 51 y.o. male with PMH CHF, ESRD, ICD in place who presents emergency department for evaluation of chest pain, nausea, vomiting.  Patient recently seen in the emergency department on 08/12/2021 and diagnosed with COVID.  He was discharged with Molnupiravir and since starting this medication has had persistent nausea and vomiting.  He states that he has been vomiting so much she is unable to tolerate p.o. and he has since started to develop a burning chest pain and sore throat.  He has noticed small streaks of blood in his vomit after multiple nonbloody vomiting episodes.  He denies shortness of breath, fever, headache or other systemic symptoms. ? ? ?Past Medical History ?Past Medical History:  ?Diagnosis Date  ? AICD (automatic cardioverter/defibrillator) present 10/14/2016  ? Bilateral deafness   ? Bronchitis   ? CHF (congestive heart failure) ([place])   ? Depression   ? Diabetes mellitus without complication ([place])   ? DVT, lower extremity ([place]) 07/12/2019  ? Dyslipidemia   ? ESRD (end stage renal disease) ([place])   ? Reports stage IV CKD, Has not started dialysis d/t inability to communicate w/ nephrologist on his first appointment  ? Headache 08/2019  ? Hypertension   ? ICD (implantable cardioverter-defibrillator) in place   ? Insomnia 08/2019  ? NICM (nonischemic cardiomyopathy) ([place]) 09/15/2016  ? Pancreatitis   ? Renal disorder   ? Sleep apnea   ? Vitamin D deficiency 08/2019  ? ?Patient Active Problem List  ? Diagnosis Date Noted  ? Acute on chronic systolic CHF (congestive heart failure) ([place]) 07/18/2021  ? Acute respiratory failure with hypoxia ([place]) 07/18/2021  ? Intractable nausea and vomiting 07/17/2021  ? Chest pain 07/17/2021  ? Type 2 diabetes mellitus with chronic kidney disease, with long-term current use of insulin  ([place]) 07/17/2021  ? Depression 07/17/2021  ? Paroxysmal atrial fibrillation ([place]) 07/17/2021  ? Hypertensive urgency 07/17/2021  ? Alcohol abuse 07/17/2021  ? Subcortical infarction ([place]) 10/03/2020  ? Cerebral thrombosis with cerebral infarction 10/01/2020  ? Unilateral weakness 09/30/2020  ? History of atrial fibrillation 09/30/2020  ? History of DVT (deep vein thrombosis) 09/30/2020  ? Chronic kidney disease, stage 3b ([place]) 07/08/2020  ? Acute CHF (congestive heart failure) ([place]) 07/07/2020  ? CKD (chronic kidney disease), stage IV ([place]) 07/07/2020  ? ICD (implantable cardioverter-defibrillator) in place 05/14/2020  ? Pulmonary embolism ([place]) 02/05/2020  ? AKI (acute kidney injury) ([place])   ? Chronic combined systolic and diastolic heart failure ([place])   ? Atrial fibrillation with RVR ([place]) 02/01/2020  ? NSVT (nonsustained ventricular tachycardia) ([place]) 02/01/2020  ? Acute GI bleeding 02/01/2020  ? DKA (diabetic ketoacidosis) ([place]) 02/01/2020  ? Bilateral deafness 08/15/2019  ? Type 2 diabetes mellitus with complication, with long-term current use of insulin ([place]) 08/15/2019  ? Hemoglobin A1C between 7% and 9% indicating borderline diabetic control ([place]) 08/15/2019  ? Insomnia 08/15/2019  ? Abdominal pain 11/14/2018  ? Ischemic cardiomyopathy 10/14/2016  ? NICM (nonischemic cardiomyopathy) ([place]) 09/15/2016  ? Chronic systolic CHF (congestive heart failure) ([place]) 05/30/2016  ? HTN (hypertension) 05/30/2016  ? Deaf 05/30/2016  ? Hyperkalemia 05/30/2016  ? Major depressive disorder, recurrent episode ([place]) 03/21/2016  ? Bronchitis 01/21/2015  ? Dyslipidemia 01/21/2015  ? Vitamin D deficiency 12/19/2011  ? Personal history of other infectious and parasitic diseases 10/25/2011  ? Anxiety  disorder 05/12/2011  ? Hyperlipidemia 05/12/2011  ? Hypertriglyceridemia 05/12/2011  ? Obstructive sleep apnea 05/12/2011  ? ?Home Medication(s) ?Prior to Admission medications   ?Medication Sig Start Date End Date Taking? Authorizing  Provider  ?acetaminophen (TYLENOL) 325 MG tablet Take 2 tablets (650 mg total) by mouth every 4 (four) hours as needed for mild pain (or temp > 37.5 C (99.5 F)). 10/07/20   Angiulli, Lavon Paganini, PA-C  ?apixaban (ELIQUIS) 5 MG TABS tablet TAKE 1 TABLET(5 MG) BY MOUTH TWICE DAILY 10/07/20   Angiulli, Lavon Paganini, PA-C  ?atorvastatin (LIPITOR) 80 MG tablet Take 1 tablet (80 mg total) by mouth at bedtime. 10/07/20   Angiulli, Lavon Paganini, PA-C  ?carvedilol (COREG) 25 MG tablet TAKE 1 TABLET (25 MG TOTAL) BY MOUTH 2 (TWO) TIMES DAILY. 01/04/21 01/04/22  Consuelo Pandy, PA-C  ?Cholecalciferol (D3-1000 PO) Take 1,000 Units by mouth daily.    [provider]  ?Continuous Blood Gluc Sensor (FREESTYLE LIBRE 3 SENSOR) MISC 1 application by Does not apply route every 14 (fourteen) days. Place 1 sensor on the skin every 14 days. Use to check glucose continuously ?Patient not taking: Reported on 07/27/2021 04/19/21   Vevelyn Francois, NP  ?dapagliflozin propanediol (FARXIGA) 10 MG TABS tablet Take 1 tablet (10 mg total) by mouth daily before breakfast. 10/27/20   Consuelo Pandy, PA-C  ?Evolocumab (REPATHA SURECLICK) 382 MG/ML SOAJ Inject 140 mg into the skin every 14 (fourteen) days. ?Patient not taking: Reported on 07/28/2021 12/29/20   Larey Dresser, MD  ?fenofibrate 54 MG tablet Take 1 tablet (54 mg total) by mouth daily. 07/07/21   Larey Dresser, MD  ?furosemide (LASIX) 80 MG tablet Take 1 tablet (80 mg total) by mouth 2 (two) times daily. 05/18/21   Larey Dresser, MD  ?hydrALAZINE (APRESOLINE) 50 MG tablet Take 1 tablet (50 mg total) by mouth 3 (three) times daily. 08/05/21   Larey Dresser, MD  ?insulin glargine (LANTUS SOLOSTAR) 100 UNIT/ML Solostar Pen Inject 10 Units into the skin at bedtime. 07/22/21 07/22/22  Ghimire, Henreitta Leber, MD  ?Insulin Pen Needle (PEN NEEDLES) 31G X 6 MM MISC 1 pen by Does not apply route daily. 01/15/21 01/15/22  Vevelyn Francois, NP  ?isosorbide mononitrate (IMDUR) 60 MG 24 hr tablet Take 1  tablet (60 mg total) by mouth daily. 08/05/21   Larey Dresser, MD  ?latanoprost (XALATAN) 0.005 % ophthalmic solution Place 1 drop into both eyes at bedtime. 01/20/20   [provider]  ?meclizine (ANTIVERT) 50 MG tablet Take 0.5 tablets (25 mg total) by mouth 3 (three) times daily as needed for dizziness. 07/28/21   Bo Merino I, NP  ?metolazone (ZAROXOLYN) 2.5 MG tablet Take 1 tablet (2.5 mg total) by mouth as directed. Per Heart Failure Team instructions. 02/17/21 05/25/22  Clegg, Amy D, NP  ?pantoprazole (PROTONIX) 40 MG tablet Take 1 tablet (40 mg total) by mouth 2 (two) times daily. 10/07/20   Angiulli, Lavon Paganini, PA-C  ?sertraline (ZOLOFT) 50 MG tablet Take 1 tablet (50 mg total) by mouth daily. 03/18/21 09/14/21  Vevelyn Francois, NP  ?VASCEPA 1 g capsule Take 2 capsules (2 g total) by mouth 2 (two) times daily. 10/07/20   Angiulli, Lavon Paganini, PA-C  ?                                                                                                                                  ?  Past Surgical History ?Past Surgical History:  ?Procedure Laterality Date  ? ATRIAL FIBRILLATION ABLATION N/A 03/02/2021  ? Procedure: ATRIAL FIBRILLATION ABLATION;  Surgeon: Constance Haw, MD;  Location: [facility];  Service: Cardiovascular;  Laterality: N/A;  ? BACK SURGERY    ? CARDIAC CATHETERIZATION    ? ESOPHAGOGASTRODUODENOSCOPY (EGD) WITH PROPOFOL N/A 02/02/2020  ? Procedure: ESOPHAGOGASTRODUODENOSCOPY (EGD) WITH PROPOFOL;  Surgeon: Arta Silence, MD;  Location: [facility];  Service: Endoscopy;  Laterality: N/A;  ? ICD IMPLANT  10/14/2016  ? ICD IMPLANT N/A 10/14/2016  ? Procedure: ICD Implant;  Surgeon: Deboraha Sprang, MD;  Location: [facility];  Service: Cardiovascular;  Laterality: N/A;  ? PRESSURE SENSOR/CARDIOMEMS N/A 09/10/2020  ? Procedure: PRESSURE SENSOR/CARDIOMEMS;  Surgeon: Larey Dresser, MD;  Location: [facility];  Service: Cardiovascular;  Laterality: N/A;  ? RIGHT HEART  CATH N/A 05/21/2020  ? Procedure: RIGHT HEART CATH;  Surgeon: Larey Dresser, MD;  Location: [facility];  Service: Cardiovascular;  Laterality: N/A;  ? WRIST SURGERY    ? ?Family History ?Family

## 2021-08-18 NOTE — ED Notes (Signed)
Pt was covid positive last Thursday, but tested negative yesterday.  ?

## 2021-08-18 NOTE — ED Notes (Signed)
Pt given [place] sandwich and ginger ale per Kommor MD.  ?

## 2021-08-18 NOTE — ED Triage Notes (Signed)
Pt bib [facility] from home c/o CP, n/v x2days. Pt is deaf. Pt is also a HF pt. EMS gave 324mg  of aspirin and 250CC fluids. ? ?EMS vitals ?172/125 BP ?90 HR ?100% O2 ?228 CBG ?

## 2021-08-18 NOTE — Consult Note (Signed)
?Cardiology Consultation:  ? ?Patient ID: Sherrilee Gilles ?MRN: [id number]; DOB: July 21, 1970 ? ?Admit date: 08/18/2021 ?Date of Consult: 08/18/2021 ? ?PCP:  Bo Merino I, NP ?  ?[facility] Providers ?Cardiologist:  Loralie Champagne, MD  ?Cardiology APP:  Conrad Robie Creek, NP     ? ? ?Patient Profile:  ? ?STORM SOVINE is a 51 y.o. male with a hx of NICM HFrEF ([place] 2016 reportedly clean in [place]) s/p BSc-ICD for prim prev and cardiomems device, afib RVR on eliquis s/p PVI ablation, prior CVA 6/22, CKD4, HTN, DM, HLD, PE 2021, [place] on CPAP, obesity, deafness who is being seen 08/18/2021 for the evaluation of chest pain and elevated troponin at the request of Dr Teressa Lower. History taken with assistance of virtual ASL interpreter ? ?History of Present Illness:  ? ?Mr. Lahaie was diagnosed with COVID 08/12/21 and discharged from the ED at that time on molnupiravir. Since this time reports recurrent nausea and vomiting. This has worsened over the past day and yesterday the patient developed chest pain after an episode of vomiting. He has had some coffee ground emesis recently. The pain is described as burning, mid-chest and throat region, associated with diaphoresis, severe during vomiting episodes and abating but still present afterwards. It is similar to previous episodes of chest pain including for an ED presentation in April 2023 (troponin at that time in 30s and felt to be non-ischemic). He has not taken meds the past two days due to vomiting  ?Significantly hypertensive to 601U systolic. Workup here notable for troponin 102 -> 105, BNP 584 last 525, normal CBC, Cr 2.85 and GFR 26 around baseline. EKG x2 with new T wave inversions laterally ?Cardiology consulted  ? ? ?Past Medical History:  ?Diagnosis Date  ? AICD (automatic cardioverter/defibrillator) present 10/14/2016  ? Bilateral deafness   ? Bronchitis   ? CHF (congestive heart failure) ([place])   ? Depression   ? Diabetes mellitus without complication ([place])   ? DVT,  lower extremity ([place]) 07/12/2019  ? Dyslipidemia   ? ESRD (end stage renal disease) ([place])   ? Reports stage IV CKD, Has not started dialysis d/t inability to communicate w/ nephrologist on his first appointment  ? Headache 08/2019  ? Hypertension   ? ICD (implantable cardioverter-defibrillator) in place   ? Insomnia 08/2019  ? NICM (nonischemic cardiomyopathy) ([place]) 09/15/2016  ? Pancreatitis   ? Renal disorder   ? Sleep apnea   ? Vitamin D deficiency 08/2019  ? ? ?Past Surgical History:  ?Procedure Laterality Date  ? ATRIAL FIBRILLATION ABLATION N/A 03/02/2021  ? Procedure: ATRIAL FIBRILLATION ABLATION;  Surgeon: Constance Haw, MD;  Location: [facility];  Service: Cardiovascular;  Laterality: N/A;  ? BACK SURGERY    ? CARDIAC CATHETERIZATION    ? ESOPHAGOGASTRODUODENOSCOPY (EGD) WITH PROPOFOL N/A 02/02/2020  ? Procedure: ESOPHAGOGASTRODUODENOSCOPY (EGD) WITH PROPOFOL;  Surgeon: Arta Silence, MD;  Location: [facility];  Service: Endoscopy;  Laterality: N/A;  ? ICD IMPLANT  10/14/2016  ? ICD IMPLANT N/A 10/14/2016  ? Procedure: ICD Implant;  Surgeon: Deboraha Sprang, MD;  Location: [facility];  Service: Cardiovascular;  Laterality: N/A;  ? PRESSURE SENSOR/CARDIOMEMS N/A 09/10/2020  ? Procedure: PRESSURE SENSOR/CARDIOMEMS;  Surgeon: Larey Dresser, MD;  Location: [facility];  Service: Cardiovascular;  Laterality: N/A;  ? RIGHT HEART CATH N/A 05/21/2020  ? Procedure: RIGHT HEART CATH;  Surgeon: Larey Dresser, MD;  Location: [facility];  Service:  Cardiovascular;  Laterality: N/A;  ? WRIST SURGERY    ?  ? ? ? ?Inpatient Medications: ?Scheduled Meds: ? aspirin  325 mg Oral Daily  ? ?Continuous Infusions: ? ?PRN Meds: ? ? ?Allergies:    ?Allergies  ?Allergen Reactions  ? Lisinopril Cough  ? ? ?Social History:   ?Social History  ? ?Socioeconomic History  ? Marital status: Legally Separated  ?  Spouse name: Not on file  ? Number of children: Not on file  ? Years of education: Not  on file  ? Highest education level: Not on file  ?Occupational History  ? Not on file  ?Tobacco Use  ? Smoking status: Never  ? Smokeless tobacco: Never  ?Vaping Use  ? Vaping Use: Never used  ?Substance and Sexual Activity  ? Alcohol use: Yes  ?  Alcohol/week: 1.0 - 2.0 standard drink  ?  Types: 1 - 2 Glasses of wine per week  ? Drug use: No  ? Sexual activity: Yes  ?  Birth control/protection: None  ?Other Topics Concern  ? Not on file  ?Social History Narrative  ? Not on file  ? ?Social Determinants of Health  ? ?Financial Resource Strain: Not on file  ?Food Insecurity: Not on file  ?Transportation Needs: Not on file  ?Physical Activity: Not on file  ?Stress: Not on file  ?Social Connections: Not on file  ?Intimate Partner Violence: Not on file  ?  ?Family History:   ? ?Family History  ?Problem Relation Age of Onset  ? Hypertension Mother   ? Healthy Father   ? Stroke Father   ?  ? ?ROS:  ?Please see the history of present illness.  ? ?All other ROS reviewed and negative.    ? ?Physical Exam/Data:  ? ?Vitals:  ? 08/18/21 1900 08/18/21 1915 08/18/21 1930 08/18/21 2015  ?BP: (!) 174/100 (!) 156/94 (!) 186/113 (!) 183/107  ?Pulse: 90 87 90 89  ?Resp: 15 16 14 14   ?Temp:      ?TempSrc:      ?SpO2: 98% 100% 100% 99%  ? ?No intake or output data in the 24 hours ending 08/18/21 2053 ? ?  08/12/2021  ?  9:39 AM 07/28/2021  ? 10:05 AM 07/27/2021  ? 11:44 AM  ?Last 3 Weights  ?Weight (lbs) 237 lb 237 lb 241 lb  ?Weight (kg) 107.502 kg 107.502 kg 109.317 kg  ?   ?There is no height or weight on file to calculate BMI.  ?General:  Well nourished, well developed, in no acute distress ?HEENT: normal ?Neck: no JVD ?Vascular: No carotid bruits; Distal pulses 2+ bilaterally ?Cardiac:  normal S1, S2; RRR; no murmur  ?Lungs:  clear to auscultation bilaterally, no wheezing, rhonchi or rales  ?Abd: soft, nontender, no hepatomegaly  ?Ext: no edema ?Musculoskeletal:  No deformities, BUE and BLE strength normal and equal ?Skin: warm and dry   ?Neuro:  CNs 2-12 intact, no focal abnormalities noted ?Psych:  Normal affect  ? ?EKG:  The EKG was personally reviewed and demonstrates:  NSR, lateral TWI ?Telemetry:  Telemetry was personally reviewed and demonstrates:  NSR ? ?Relevant CV Studies: ? ? ?Laboratory Data: ? ?High Sensitivity Troponin:   ?Recent Labs  ?Lab 08/12/21 ?1005 08/18/21 ?1525 08/18/21 ?1804  ?TROPONINIHS 15 102* 105*  ?   ?Chemistry ?Recent Labs  ?Lab 08/12/21 ?1005 08/18/21 ?1525  ?NA 138 139  ?K 4.9 5.4*  ?CL 102 99  ?CO2 27 20*  ?GLUCOSE 150* 207*  ?BUN 49*  59*  ?CREATININE 3.82* 2.85*  ?CALCIUM 9.0 9.1  ?GFRNONAA 18* 26*  ?ANIONGAP 9 20*  ?  ?Recent Labs  ?Lab 08/18/21 ?1525  ?PROT 7.9  ?ALBUMIN 3.6  ?AST 41  ?ALT 35  ?ALKPHOS 43  ?BILITOT 1.6*  ? ?Lipids No results for input(s): CHOL, TRIG, HDL, LABVLDL, LDLCALC, CHOLHDL in the last 168 hours.  ?Hematology ?Recent Labs  ?Lab 08/12/21 ?1005 08/18/21 ?1525  ?WBC 5.2 5.3  ?RBC 3.86* 4.59  ?HGB 10.9* 13.0  ?HCT 35.5* 42.2  ?MCV 92.0 91.9  ?MCH 28.2 28.3  ?MCHC 30.7 30.8  ?RDW 13.2 13.0  ?PLT 165 235  ? ?Thyroid No results for input(s): TSH, FREET4 in the last 168 hours.  ?BNP ?Recent Labs  ?Lab 08/18/21 ?1525  ?BNP 584.2*  ?  ?DDimer No results for input(s): DDIMER in the last 168 hours. ? ? ?Radiology/Studies:  ?DG Chest 2 View ? ?Result Date: 08/18/2021 ?CLINICAL DATA:  Chest pain EXAM: CHEST - 2 VIEW COMPARISON:  None Available. FINDINGS: LEFT-sided pacer overlies normal cardiac silhouette. No effusion, infiltrate, or pneumothorax. IMPRESSION: No acute cardiopulmonary process. Electronically Signed   By: Suzy Bouchard M.D.   On: 08/18/2021 16:35   ? ? ?Assessment and Plan:  ?Mr Trombetta is a 53 YOM hx NICM HFrEF, CVA, HTN, DM, CKD4 p/w COVID and chest pain with troponin of 100s x2 and lateral T wave inversions on EKG in setting of vomiting coffee ground emesis ?Chest pain, mildly elevated troponins: Differential for symptoms includes Mallory Weiss tear as this pain is being exacerbated by  vomiting, first occurred during vomiting, and he is having coffee ground emesis. Also includes myocarditis from [facility]. Less likely ACS given flat troponin and atypical chest pain but cannot fully exc

## 2021-08-19 ENCOUNTER — Encounter (HOSPITAL_COMMUNITY): Payer: Self-pay | Admitting: Internal Medicine

## 2021-08-19 ENCOUNTER — Telehealth (HOSPITAL_COMMUNITY): Payer: Self-pay

## 2021-08-19 DIAGNOSIS — I480 Paroxysmal atrial fibrillation: Secondary | ICD-10-CM | POA: Diagnosis not present

## 2021-08-19 DIAGNOSIS — R112 Nausea with vomiting, unspecified: Secondary | ICD-10-CM

## 2021-08-19 DIAGNOSIS — N183 Chronic kidney disease, stage 3 unspecified: Secondary | ICD-10-CM

## 2021-08-19 DIAGNOSIS — R079 Chest pain, unspecified: Secondary | ICD-10-CM | POA: Diagnosis not present

## 2021-08-19 DIAGNOSIS — E1122 Type 2 diabetes mellitus with diabetic chronic kidney disease: Secondary | ICD-10-CM

## 2021-08-19 DIAGNOSIS — I10 Essential (primary) hypertension: Secondary | ICD-10-CM

## 2021-08-19 DIAGNOSIS — I5022 Chronic systolic (congestive) heart failure: Secondary | ICD-10-CM | POA: Diagnosis not present

## 2021-08-19 DIAGNOSIS — U071 COVID-19: Secondary | ICD-10-CM

## 2021-08-19 DIAGNOSIS — Z794 Long term (current) use of insulin: Secondary | ICD-10-CM

## 2021-08-19 DIAGNOSIS — I248 Other forms of acute ischemic heart disease: Secondary | ICD-10-CM

## 2021-08-19 DIAGNOSIS — N1832 Chronic kidney disease, stage 3b: Secondary | ICD-10-CM | POA: Diagnosis not present

## 2021-08-19 LAB — CBG MONITORING, ED: Glucose-Capillary: 266 mg/dL — ABNORMAL HIGH (ref 70–99)

## 2021-08-19 LAB — TROPONIN I (HIGH SENSITIVITY)
Troponin I (High Sensitivity): 86 ng/L — ABNORMAL HIGH (ref <18)
Troponin I (High Sensitivity): 78 ng/L — ABNORMAL HIGH (ref <18)

## 2021-08-19 LAB — HEMOGLOBIN AND HEMATOCRIT, BLOOD
HCT: 36.6 % — ABNORMAL LOW (ref 39.0–52.0)
Hemoglobin: 11.5 g/dL — ABNORMAL LOW (ref 13.0–17.0)

## 2021-08-19 LAB — BASIC METABOLIC PANEL
Anion gap: 9 (ref 5–15)
BUN: 64 mg/dL — ABNORMAL HIGH (ref 6–20)
CO2: 29 mmol/L (ref 22–32)
Calcium: 8.9 mg/dL (ref 8.9–10.3)
Chloride: 97 mmol/L — ABNORMAL LOW (ref 98–111)
Creatinine, Ser: 3.05 mg/dL — ABNORMAL HIGH (ref 0.61–1.24)
GFR, Estimated: 24 mL/min — ABNORMAL LOW (ref >60)
Glucose, Bld: 266 mg/dL — ABNORMAL HIGH (ref 70–99)
Potassium: 4.8 mmol/L (ref 3.5–5.1)
Sodium: 135 mmol/L (ref 135–145)

## 2021-08-19 MED ORDER — ISOSORBIDE MONONITRATE ER 30 MG PO TB24
60.0000 mg | ORAL_TABLET | Freq: Every day | ORAL | Status: DC
  Administered 2021-08-19: 60 mg via ORAL
  Filled 2021-08-19: qty 2

## 2021-08-19 MED ORDER — ATORVASTATIN CALCIUM 80 MG PO TABS: 80.0000 mg | ORAL_TABLET | Freq: Every day | ORAL | Status: DC

## 2021-08-19 MED ORDER — FUROSEMIDE 20 MG PO TABS
80.0000 mg | ORAL_TABLET | Freq: Two times a day (BID) | ORAL | Status: DC
  Administered 2021-08-19: 80 mg via ORAL
  Filled 2021-08-19: qty 4

## 2021-08-19 MED ORDER — HYDRALAZINE HCL 50 MG PO TABS
50.0000 mg | ORAL_TABLET | Freq: Three times a day (TID) | ORAL | Status: DC
  Administered 2021-08-19: 50 mg via ORAL
  Filled 2021-08-19: qty 1

## 2021-08-19 MED ORDER — SERTRALINE HCL 50 MG PO TABS
50.0000 mg | ORAL_TABLET | Freq: Every day | ORAL | Status: DC
  Administered 2021-08-19: 50 mg via ORAL
  Filled 2021-08-19: qty 1

## 2021-08-19 MED ORDER — METOLAZONE 2.5 MG PO TABS: 2.5000 mg | ORAL_TABLET | ORAL | Status: DC

## 2021-08-19 MED ORDER — ONDANSETRON HCL 4 MG PO TABS: 4.0000 mg | ORAL_TABLET | Freq: Three times a day (TID) | ORAL | 0 refills | Status: AC | PRN

## 2021-08-19 MED ORDER — SODIUM CHLORIDE 0.45 % IV SOLN: INTRAVENOUS | Status: DC

## 2021-08-19 MED ORDER — PANTOPRAZOLE SODIUM 40 MG IV SOLR
40.0000 mg | Freq: Two times a day (BID) | INTRAVENOUS | Status: DC
  Administered 2021-08-19 (×2): 40 mg via INTRAVENOUS
  Filled 2021-08-19 (×2): qty 10

## 2021-08-19 MED ORDER — APIXABAN 5 MG PO TABS
5.0000 mg | ORAL_TABLET | Freq: Two times a day (BID) | ORAL | Status: DC
  Administered 2021-08-19 (×2): 5 mg via ORAL
  Filled 2021-08-19 (×2): qty 1

## 2021-08-19 MED ORDER — CARVEDILOL 3.125 MG PO TABS
25.0000 mg | ORAL_TABLET | Freq: Two times a day (BID) | ORAL | Status: DC
Start: 2021-08-19 — End: 2021-08-19
  Administered 2021-08-19 (×2): 25 mg via ORAL
  Filled 2021-08-19: qty 8
  Filled 2021-08-19: qty 2

## 2021-08-19 MED ORDER — LATANOPROST 0.005 % OP SOLN
1.0000 [drp] | Freq: Every day | OPHTHALMIC | Status: DC
  Filled 2021-08-19: qty 2.5

## 2021-08-19 MED ORDER — INSULIN GLARGINE-YFGN 100 UNIT/ML ~~LOC~~ SOLN
10.0000 [IU] | Freq: Every day | SUBCUTANEOUS | Status: DC
  Administered 2021-08-19: 10 [IU] via SUBCUTANEOUS
  Filled 2021-08-19 (×2): qty 0.1

## 2021-08-19 MED ORDER — ONDANSETRON HCL 4 MG/2ML IJ SOLN: 4.0000 mg | Freq: Four times a day (QID) | INTRAMUSCULAR | Status: DC | PRN

## 2021-08-19 MED ORDER — DAPAGLIFLOZIN PROPANEDIOL 10 MG PO TABS
10.0000 mg | ORAL_TABLET | Freq: Every day | ORAL | Status: DC
  Administered 2021-08-19: 10 mg via ORAL
  Filled 2021-08-19: qty 1

## 2021-08-19 MED ORDER — ICOSAPENT ETHYL 1 G PO CAPS
2.0000 g | ORAL_CAPSULE | Freq: Two times a day (BID) | ORAL | Status: DC
  Administered 2021-08-19: 2 g via ORAL
  Filled 2021-08-19 (×2): qty 2

## 2021-08-19 MED ORDER — MECLIZINE HCL 25 MG PO TABS: 25.0000 mg | ORAL_TABLET | Freq: Three times a day (TID) | ORAL | Status: DC | PRN

## 2021-08-19 MED ORDER — ACETAMINOPHEN 325 MG PO TABS: 650.0000 mg | ORAL_TABLET | ORAL | Status: DC | PRN

## 2021-08-19 MED ORDER — FENOFIBRATE 54 MG PO TABS
54.0000 mg | ORAL_TABLET | Freq: Every day | ORAL | Status: DC
  Administered 2021-08-19: 54 mg via ORAL
  Filled 2021-08-19: qty 1

## 2021-08-19 NOTE — Assessment & Plan Note (Signed)
Last A1C 07/28/21  6.4% ? ?Plan Continue home regimen ?

## 2021-08-19 NOTE — Subjective & Objective (Signed)
Troy Rush is a 51 y/o with NICM HFrEF, ICD, a. Fib with RVR on Eliquis, S/P PVI ablation, prior CVA 6/22, CKD4, HTN, DM, HLD, PE in 2021, OSA on CPAP, obesity. ? ?Cavet - patient is deaf - full HPI from cardiology note: Troy Rush was diagnosed with COVID 08/12/21 and discharged from the ED at that time on molnupiravir. Since this time reports recurrent nausea and vomiting. This has worsened over the past day and yesterday the patient developed chest pain after an episode of vomiting. He has had some coffee ground emesis recently. The pain is described as burning, mid-chest and throat region, associated with diaphoresis, severe during vomiting episodes and abating but still present afterwards. It is similar to previous episodes of chest pain including for an ED presentation in April 2023 (troponin at that time in 30s and felt to be non-ischemic). He has not taken meds the past two days due to vomiting  ?Significantly hypertensive to 801K systolic. Workup here notable for troponin 102 -> 105, BNP 584 last 525, normal CBC, Cr 2.85 and GFR 26 around baseline. EKG x2 with new T wave inversions laterally ? ?

## 2021-08-19 NOTE — Assessment & Plan Note (Signed)
Cardiology has seen. Low risk with pain likely due to mallory weiss tear.  ? ?Plan Per cardiology recommendation - observe on tele ? Second set troponins ? Continue home meds ?  ?

## 2021-08-19 NOTE — Discharge Summary (Signed)
? ?Physician Discharge Summary  ?Troy Rush [id number]:[id number] DOB: 04/01/1971 DOA: 08/18/2021 ? ?PCP: Teena Dunk, NP ? ?Admit date: 08/18/2021 ?Discharge date: 08/19/2021 ? ?Admitted From: home ?Disposition:  home ? ?Recommendations for Outpatient Follow-up:  ?Follow up with PCP in 1-2 weeks ?Please obtain BMP/CBC in one week ?Please follow up on the following pending results: ? ?Home Health: none ?Equipment/Devices: none ? ?Discharge Condition: stable ?CODE STATUS: Full code ?Diet Orders (From admission, onward)  ? ?  Start     Ordered  ? 08/19/21 0234  Diet heart healthy/carb modified Room service appropriate? Yes with Assist; Fluid consistency: Thin  Diet effective now       ?Question Answer Comment  ?Diet-HS Snack? Nothing   ?Room service appropriate? Yes with Assist   ?Fluid consistency: Thin   ?  ? 08/19/21 0235  ? ?  ?  ? ?  ? ? ?HPI: Per admitting MD, ?Troy Rush is a 51 y/o with NICM HFrEF, ICD, a. Fib with RVR on Eliquis, S/P PVI ablation, prior CVA 6/22, CKD4, HTN, DM, HLD, PE in 2021, OSA on CPAP, obesity. [place] - patient is deaf - full HPI from cardiology note: Troy Rush was diagnosed with COVID 08/12/21 and discharged from the ED at that time on molnupiravir. Since this time reports recurrent nausea and vomiting. This has worsened over the past day and yesterday the patient developed chest pain after an episode of vomiting. He has had some coffee ground emesis recently. The pain is described as burning, mid-chest and throat region, associated with diaphoresis, severe during vomiting episodes and abating but still present afterwards. It is similar to previous episodes of chest pain including for an ED presentation in April 2023 (troponin at that time in 30s and felt to be non-ischemic). He has not taken meds the past two days due to vomiting Significantly hypertensive to 185U systolic. Workup here notable for troponin 102 -> 105, BNP 584 last 525, normal CBC, Cr 2.85 and GFR 26 around baseline. EKG x2  with new T wave inversions laterally ? ?Hospital Course / Discharge diagnoses: ?Principal Problem: ?  Chest pain ?Active Problems: ?  Intractable nausea and vomiting ?  Chronic systolic CHF (congestive heart failure) ([place]) ?  Chronic kidney disease, stage 3b ([place]) ?  Type 2 diabetes mellitus with chronic kidney disease, with long-term current use of insulin ([place]) ?  Paroxysmal atrial fibrillation (HCC) ?  COVID-19 virus infection ?  HTN (hypertension) ? ?Principal problem ?Chest pain-patient admitted to the hospital with chest pain in the setting of intractable nausea and vomiting.  Cardiology consulted and evaluated patient.  His high-sensitivity troponin was elevated, however was flat, not in a pattern consistent with ACS.  Chest pain is probably related to his GI symptoms.  This is resolved, discussed with cardiology, will be discharged home in stable condition ? ?Active problems ?Intractable nausea, vomiting-possibly due to underlying COVID infection versus his antivirals for COVID.  Actually improved for the past couple days, no longer has any nausea or vomiting, able to tolerate a regular diet, eating and drinking well, will be discharged home in stable condition.  He was placed on PPI, continue on discharge.  Continue as needed Zofran on discharge. ?Chronic kidney disease stage IIIb-baseline creatinine around 2.7-2.9.  Creatinine last week when he came in for Covid was 3.8, currently 3.0, close to his baseline.  Outpatient follow-up ?COVID-19 virus infection-he was initially diagnosed 7 days ago.  No significant symptoms currently ?DM2-last A1c 6.4.  Continue  home regimen ?Chronic systolic CHF-stable, no evidence of decompensation.  Has an ICD in place.  He appears euvolemic.  Resume home medications ?Essential hypertension-continue home medications on discharge ?Obesity, class I-based on BMI of 34, he would benefit from weight loss ? ?Sepsis ruled out ? ? ?Discharge Instructions ? ? ?Allergies as of  08/19/2021   ? ?   Reactions  ? Lisinopril Cough  ? ?  ? ?  ?Medication List  ?  ? ?TAKE these medications   ? ?acetaminophen 325 MG tablet ?Commonly known as: TYLENOL ?Take 2 tablets (650 mg total) by mouth every 4 (four) hours as needed for mild pain (or temp > 37.5 C (99.5 F)). ?  ?apixaban 5 MG Tabs tablet ?Commonly known as: Eliquis ?TAKE 1 TABLET(5 MG) BY MOUTH TWICE DAILY ?What changed:  ?how much to take ?how to take this ?when to take this ?additional instructions ?  ?atorvastatin 80 MG tablet ?Commonly known as: LIPITOR ?Take 1 tablet (80 mg total) by mouth at bedtime. ?  ?carvedilol 25 MG tablet ?Commonly known as: COREG ?TAKE 1 TABLET (25 MG TOTAL) BY MOUTH 2 (TWO) TIMES DAILY. ?  ?D3-1000 PO ?Take 1,000 Units by mouth daily. ?  ?dapagliflozin propanediol 10 MG Tabs tablet ?Commonly known as: [place] ?Take 1 tablet (10 mg total) by mouth daily before breakfast. ?  ?fenofibrate 54 MG tablet ?Take 1 tablet (54 mg total) by mouth daily. ?  ?FreeStyle Libre 3 Sensor Misc ?1 application by Does not apply route every 14 (fourteen) days. Place 1 sensor on the skin every 14 days. Use to check glucose continuously ?  ?furosemide 80 MG tablet ?Commonly known as: LASIX ?Take 1 tablet (80 mg total) by mouth 2 (two) times daily. ?  ?hydrALAZINE 50 MG tablet ?Commonly known as: APRESOLINE ?Take 1 tablet (50 mg total) by mouth 3 (three) times daily. ?  ?isosorbide mononitrate 60 MG 24 hr tablet ?Commonly known as: IMDUR ?Take 1 tablet (60 mg total) by mouth daily. ?  ?Lantus SoloStar 100 UNIT/ML Solostar Pen ?Generic drug: insulin glargine ?Inject 10 Units into the skin at bedtime. ?  ?latanoprost 0.005 % ophthalmic solution ?Commonly known as: XALATAN ?Place 1 drop into both eyes at bedtime. ?  ?meclizine 50 MG tablet ?Commonly known as: ANTIVERT ?Take 0.5 tablets (25 mg total) by mouth 3 (three) times daily as needed for dizziness. ?  ?metolazone 2.5 MG tablet ?Commonly known as: ZAROXOLYN ?Take 1 tablet (2.5 mg  total) by mouth as directed. Per Heart Failure Team instructions. ?  ?ondansetron 4 MG tablet ?Commonly known as: Zofran ?Take 1 tablet (4 mg total) by mouth every 8 (eight) hours as needed for nausea or vomiting. ?  ?pantoprazole 40 MG tablet ?Commonly known as: PROTONIX ?Take 1 tablet (40 mg total) by mouth 2 (two) times daily. ?  ?Pen Needles 31G X 6 MM Misc ?1 pen by Does not apply route daily. ?  ?Repatha SureClick 341 MG/ML Soaj ?Generic drug: Evolocumab ?Inject 140 mg into the skin every 14 (fourteen) days. ?  ?sertraline 50 MG tablet ?Commonly known as: ZOLOFT ?Take 1 tablet (50 mg total) by mouth daily. ?  ?Vascepa 1 g capsule ?Generic drug: icosapent Ethyl ?Take 2 capsules (2 g total) by mouth 2 (two) times daily. ?  ? ?  ? ? ?Consultations: ?Cardiology  ? ?Procedures/Studies: ? ?DG Chest 2 View ? ?Result Date: 08/18/2021 ?CLINICAL DATA:  Chest pain EXAM: CHEST - 2 VIEW COMPARISON:  None Available. FINDINGS: LEFT-sided pacer  overlies normal cardiac silhouette. No effusion, infiltrate, or pneumothorax. IMPRESSION: No acute cardiopulmonary process. Electronically Signed   By: Suzy Bouchard M.D.   On: 08/18/2021 16:35  ? ?DG Chest 2 View ? ?Result Date: 08/12/2021 ?CLINICAL DATA:  Chest pain EXAM: CHEST - 2 VIEW COMPARISON:  07/17/2021 FINDINGS: Mild bilateral interstitial thickening. No focal consolidation. No pleural effusion or pneumothorax. Stable cardiomegaly. Single lead cardiac pacemaker. No acute osseous abnormality. IMPRESSION: 1. Cardiomegaly with mild pulmonary vascular congestion. Electronically Signed   By: Kathreen Devoid M.D.   On: 08/12/2021 10:25   ? ? ?Subjective: ?- no chest pain, shortness of breath, no abdominal pain, nausea or vomiting.  ? ?Discharge Exam: ?BP (!) 138/99   Pulse 76   Temp 97.6 ?F (36.4 ?C) (Axillary)   Resp 15   Ht 5\' 10"  (1.778 m)   Wt 107.5 kg   SpO2 100%   BMI 34.01 kg/m?  ? ?General: Pt is alert, awake, not in acute distress ?Cardiovascular: RRR, S1/S2 +, no rubs,  no gallops ?Respiratory: CTA bilaterally, no wheezing, no rhonchi ?Abdominal: Soft, NT, ND, bowel sounds + ?Extremities: no edema, no cyanosis ? ? ? ?The results of significant diagnostics from this hospitalization (includ

## 2021-08-19 NOTE — Telephone Encounter (Signed)
Pt contacted me stating he is being d/c today and needs his bubble packs. I contacted pharmacy and they will get that delivered tomor along with his meclizine and zofran in the bottles.  ?Pt is aware.  ? ?[name], [place]  ?[phone] ?08/19/2021 ? ?

## 2021-08-19 NOTE — Progress Notes (Signed)
? ?Progress Note ? ?Patient Name: Troy Rush ?Date of Encounter: 08/19/2021 ? ?[facility] Cardiologist: Loralie Champagne, MD  ? ?Subjective  ? ?ASL interpreter used through iPAD. Denies any recent nausea, chest pain, shortness of breath. Has tolerated pills and food without issue. No bleeding. About to eat lunch. ? ?Inpatient Medications  ?  ?Scheduled Meds: ? apixaban  5 mg Oral BID  ? [START ON 08/20/2021] atorvastatin  80 mg Oral QHS  ? carvedilol  25 mg Oral BID  ? dapagliflozin propanediol  10 mg Oral QAC breakfast  ? fenofibrate  54 mg Oral Daily  ? hydrALAZINE  50 mg Oral TID  ? icosapent Ethyl  2 g Oral BID  ? insulin glargine-yfgn  10 Units Subcutaneous QHS  ? isosorbide mononitrate  60 mg Oral Daily  ? [START ON 08/20/2021] latanoprost  1 drop Both Eyes QHS  ? pantoprazole (PROTONIX) IV  40 mg Intravenous Q12H  ? sertraline  50 mg Oral Daily  ? ?Continuous Infusions: ? sodium chloride 10 mL/hr at 08/19/21 0313  ? ?PRN Meds: ?acetaminophen, meclizine, ondansetron (ZOFRAN) IV  ? ?Vital Signs  ?  ?Vitals:  ? 08/19/21 0950 08/19/21 0954 08/19/21 1045 08/19/21 1100  ?BP: (!) 139/101 (!) 139/101 139/87 125/76  ?Pulse:   79 80  ?Resp:   20 14  ?Temp:      ?TempSrc:      ?SpO2:   100% 99%  ?Weight:      ?Height:      ? ?No intake or output data in the 24 hours ending 08/19/21 1316 ? ?  08/19/2021  ?  8:43 AM 08/12/2021  ?  9:39 AM 07/28/2021  ? 10:05 AM  ?Last 3 Weights  ?Weight (lbs) 236 lb 15.9 oz 237 lb 237 lb  ?Weight (kg) 107.5 kg 107.502 kg 107.502 kg  ?   ? ?Telemetry  ?  ?NSR - Personally Reviewed ? ?ECG  ?  ?SR - Personally Reviewed ? ?Physical Exam  ? ?GEN: No acute distress.   ?Neck: No JVD ?Cardiac: RRR, no murmurs, rubs, or gallops.  ?Respiratory: Clear to auscultation bilaterally. ?GI: Soft, nontender, non-distended  ?MS: No edema; No deformity. ?Neuro:  Nonfocal  ?Psych: Normal affect  ? ?Labs  ?  ?High Sensitivity Troponin:   ?Recent Labs  ?Lab 08/12/21 ?1005 08/18/21 ?1525 08/18/21 ?1804  08/19/21 ?0234 08/19/21 ?8676  ?TROPONINIHS 15 102* 105* 86* 78*  ?   ?Chemistry ?Recent Labs  ?Lab 08/18/21 ?1525 08/19/21 ?0855  ?NA 139 135  ?K 5.4* 4.8  ?CL 99 97*  ?CO2 20* 29  ?GLUCOSE 207* 266*  ?BUN 59* 64*  ?CREATININE 2.85* 3.05*  ?CALCIUM 9.1 8.9  ?PROT 7.9  --   ?ALBUMIN 3.6  --   ?AST 41  --   ?ALT 35  --   ?ALKPHOS 43  --   ?BILITOT 1.6*  --   ?GFRNONAA 26* 24*  ?ANIONGAP 20* 9  ?  ?Lipids No results for input(s): CHOL, TRIG, HDL, LABVLDL, LDLCALC, CHOLHDL in the last 168 hours.  ?Hematology ?Recent Labs  ?Lab 08/18/21 ?1525 08/19/21 ?0855  ?WBC 5.3  --   ?RBC 4.59  --   ?HGB 13.0 11.5*  ?HCT 42.2 36.6*  ?MCV 91.9  --   ?MCH 28.3  --   ?MCHC 30.8  --   ?RDW 13.0  --   ?PLT 235  --   ? ?Thyroid No results for input(s): TSH, FREET4 in the last 168 hours.  ?BNP ?Recent  Labs  ?Lab 08/18/21 ?1525  ?BNP 584.2*  ?  ?DDimer No results for input(s): DDIMER in the last 168 hours.  ? ?Radiology  ?  ?DG Chest 2 View ? ?Result Date: 08/18/2021 ?CLINICAL DATA:  Chest pain EXAM: CHEST - 2 VIEW COMPARISON:  None Available. FINDINGS: LEFT-sided pacer overlies normal cardiac silhouette. No effusion, infiltrate, or pneumothorax. IMPRESSION: No acute cardiopulmonary process. Electronically Signed   By: Suzy Bouchard M.D.   On: 08/18/2021 16:35   ? ?Cardiac Studies  ? ?None this admission ? ?Patient Profile  ?   ?51 y.o. male with a hx of NICM HFrEF ([place] 2016 reportedly clean in [place]) s/p BSc-ICD for prim prev and cardiomems device, afib RVR on eliquis s/p PVI ablation, prior CVA 6/22, CKD4, HTN, DM, HLD, PE 2021, [place]sa on CPAP, obesity, recent COVID, deafness who is being seen 08/18/2021 for the evaluation of chest pain and elevated troponin at the request of Dr Teressa Lower.  ? ?Assessment & Plan  ?  ?Elevated troponin ?Chest pain ?-mild hsTn elevation, no further chest pain ?-suspect this was 2/2 illness/dehydration ?-stop ASA 325 mg--especially in the setting of GI upset/long term anticoagulation ?-continue  atorvastatin, repatha, fenofibrate, vascepa, carvedilol ? ?Chronic systolic and diastolic heart failure ?Nonischemic cardiomyopathy ?-has outpatient echo scheduled 5/18. Ok to get echo as outpatient rather than inpatient ?-continue carvedilol, farxiga, imdur ?-restart home lasix after discharge if PO intake remains normal, without further emesis ? ?Paroxysmal atrial fibrillation ?-has remained in NSR ?-no active bleeding, Hgb within range (suspect concentrated on presentation) ?-ok to resume apixaban ? ?Recent COVID+ ?-appears asymptomatic today ?-med may have contributed to his GI upset/retching ? ?[facility] will sign off.   ?Medication Recommendations:  as above, return to home meds. NO aspirin at discharge ?Other recommendations (labs, testing, etc):  contact the office if he develops GI upset/emesis again ?Follow up as an outpatient:  Has HF APP scheduled 08/26/21 after his echo ? ?For questions or updates, please contact [place] ?Please consult [email or web address] for contact info under  ?   ?Signed, ?Buford Dresser, MD  ?08/19/2021, 1:16 PM    ?

## 2021-08-19 NOTE — Assessment & Plan Note (Signed)
No evidence of decompensation. ? ?Plan Continue home regimen ?

## 2021-08-19 NOTE — Assessment & Plan Note (Signed)
Most likely medication related. This has lead to probable mallory weiss tear with mild hematemesis. ? ?Plan IV Protonix bid ?

## 2021-08-19 NOTE — Assessment & Plan Note (Signed)
Stable;will continue present meds 

## 2021-08-19 NOTE — Assessment & Plan Note (Signed)
Patient tested positive on May 4th in [facility] ED. He had a constellation of symptoms that were non-specific, CXR with pulmonary edema but without infiltrates. He was sent home on molnupiravir due to equivocal symptoms but multiple co-morbidities. He was unable to tolerate medication due to N/V. He reports he had retching and developed chest pain and also scant blood in his secretions.  ? ?Plan Asymptomatic, now greater that 7 days out - no additional medical treatment indicated.  ?

## 2021-08-19 NOTE — H&P (Signed)
?History and Physical  ? ? ?Troy Rush [id number]:[id number] DOB: May 09, 1970 DOA: 08/18/2021 ? ?DOS: the patient was seen and examined on 08/18/2021 ? ?PCP: Teena Dunk, NP  ? ?Patient coming from: Home ? ?I have personally briefly reviewed patient's old medical records in [place] ? ?Troy Rush is a 51 y/o with NICM HFrEF, ICD, a. Fib with RVR on Eliquis, S/P PVI ablation, prior CVA 6/22, CKD4, HTN, DM, HLD, PE in 2021, OSA on CPAP, obesity. ? ?[place] - patient is deaf - full HPI from cardiology note: [name] was diagnosed with COVID 08/12/21 and discharged from the ED at that time on molnupiravir. Since this time reports recurrent nausea and vomiting. This has worsened over the past day and yesterday the patient developed chest pain after an episode of vomiting. He has had some coffee ground emesis recently. The pain is described as burning, mid-chest and throat region, associated with diaphoresis, severe during vomiting episodes and abating but still present afterwards. It is similar to previous episodes of chest pain including for an ED presentation in April 2023 (troponin at that time in 30s and felt to be non-ischemic). He has not taken meds the past two days due to vomiting  ?Significantly hypertensive to 381O systolic. Workup here notable for troponin 102 -> 105, BNP 584 last 525, normal CBC, Cr 2.85 and GFR 26 around baseline. EKG x2 with new T wave inversions laterally ?  ? ?ED Course: BP 169/101, vitals otherwise nl. Lab revealed K 5.9, glucose 207 BUN 59, Cr 2.85. BNP 5,842 (chronic elevation) CBCD nl. Troponon #1 102, #2 105. EKG with possible lateral ischemia. Cardiology was consulted. [facility] called to admit for observation and repeat Troponins.  ? ?Review of Systems:  ?Review of Systems  ?Constitutional: Negative.   ?HENT:  Positive for hearing loss.   ?Eyes: Negative.   ?Respiratory: Negative.    ?Cardiovascular:  Positive for chest pain.  ?Gastrointestinal:  Positive for nausea and vomiting.   ?Genitourinary: Negative.   ?Musculoskeletal: Negative.   ?Skin: Negative.   ?Neurological: Negative.   ?Psychiatric/Behavioral: Negative.    ? ?Past Medical History:  ?Diagnosis Date  ? AICD (automatic cardioverter/defibrillator) present 10/14/2016  ? Bilateral deafness   ? Bronchitis   ? CHF (congestive heart failure) ([place])   ? Depression   ? Diabetes mellitus without complication ([place])   ? DVT, lower extremity ([place]) 07/12/2019  ? Dyslipidemia   ? ESRD (end stage renal disease) ([place])   ? Reports stage IV CKD, Has not started dialysis d/t inability to communicate w/ nephrologist on his first appointment  ? Headache 08/2019  ? Hypertension   ? ICD (implantable cardioverter-defibrillator) in place   ? Insomnia 08/2019  ? NICM (nonischemic cardiomyopathy) ([place]) 09/15/2016  ? Pancreatitis   ? Renal disorder   ? Sleep apnea   ? Vitamin D deficiency 08/2019  ? ? ?Past Surgical History:  ?Procedure Laterality Date  ? ATRIAL FIBRILLATION ABLATION N/A 03/02/2021  ? Procedure: ATRIAL FIBRILLATION ABLATION;  Surgeon: Constance Haw, MD;  Location: [facility];  Service: Cardiovascular;  Laterality: N/A;  ? BACK SURGERY    ? CARDIAC CATHETERIZATION    ? ESOPHAGOGASTRODUODENOSCOPY (EGD) WITH PROPOFOL N/A 02/02/2020  ? Procedure: ESOPHAGOGASTRODUODENOSCOPY (EGD) WITH PROPOFOL;  Surgeon: Arta Silence, MD;  Location: [facility];  Service: Endoscopy;  Laterality: N/A;  ? ICD IMPLANT  10/14/2016  ? ICD IMPLANT N/A 10/14/2016  ? Procedure: ICD Implant;  Surgeon: Deboraha Sprang, MD;  Location:  [facility];  Service: Cardiovascular;  Laterality: N/A;  ? PRESSURE SENSOR/CARDIOMEMS N/A 09/10/2020  ? Procedure: PRESSURE SENSOR/CARDIOMEMS;  Surgeon: Larey Dresser, MD;  Location: [facility];  Service: Cardiovascular;  Laterality: N/A;  ? RIGHT HEART CATH N/A 05/21/2020  ? Procedure: RIGHT HEART CATH;  Surgeon: Larey Dresser, MD;  Location: [facility];  Service: Cardiovascular;  Laterality: N/A;  ?  WRIST SURGERY    ? ? ? reports that he has never smoked. He has never used smokeless tobacco. He reports current alcohol use of about 1.0 - 2.0 standard drink per week. He reports that he does not use drugs. ? ?Allergies  ?Allergen Reactions  ? Lisinopril Cough  ? ? ?Family History  ?Problem Relation Age of Onset  ? Hypertension Mother   ? Healthy Father   ? Stroke Father   ? ? ?Prior to Admission medications   ?Medication Sig Start Date End Date Taking? Authorizing Provider  ?acetaminophen (TYLENOL) 325 MG tablet Take 2 tablets (650 mg total) by mouth every 4 (four) hours as needed for mild pain (or temp > 37.5 C (99.5 F)). 10/07/20   Angiulli, Lavon Paganini, PA-C  ?apixaban (ELIQUIS) 5 MG TABS tablet TAKE 1 TABLET(5 MG) BY MOUTH TWICE DAILY 10/07/20   Angiulli, Lavon Paganini, PA-C  ?atorvastatin (LIPITOR) 80 MG tablet Take 1 tablet (80 mg total) by mouth at bedtime. 10/07/20   Angiulli, Lavon Paganini, PA-C  ?carvedilol (COREG) 25 MG tablet TAKE 1 TABLET (25 MG TOTAL) BY MOUTH 2 (TWO) TIMES DAILY. 01/04/21 01/04/22  Consuelo Pandy, PA-C  ?Cholecalciferol (D3-1000 PO) Take 1,000 Units by mouth daily.    [provider]  ?Continuous Blood Gluc Sensor (FREESTYLE LIBRE 3 SENSOR) MISC 1 application by Does not apply route every 14 (fourteen) days. Place 1 sensor on the skin every 14 days. Use to check glucose continuously ?Patient not taking: Reported on 07/27/2021 04/19/21   Vevelyn Francois, NP  ?dapagliflozin propanediol (FARXIGA) 10 MG TABS tablet Take 1 tablet (10 mg total) by mouth daily before breakfast. 10/27/20   Consuelo Pandy, PA-C  ?Evolocumab (REPATHA SURECLICK) 253 MG/ML SOAJ Inject 140 mg into the skin every 14 (fourteen) days. ?Patient not taking: Reported on 07/28/2021 12/29/20   Larey Dresser, MD  ?fenofibrate 54 MG tablet Take 1 tablet (54 mg total) by mouth daily. 07/07/21   Larey Dresser, MD  ?furosemide (LASIX) 80 MG tablet Take 1 tablet (80 mg total) by mouth 2 (two) times daily. 05/18/21   Larey Dresser, MD  ?hydrALAZINE (APRESOLINE) 50 MG tablet Take 1 tablet (50 mg total) by mouth 3 (three) times daily. 08/05/21   Larey Dresser, MD  ?insulin glargine (LANTUS SOLOSTAR) 100 UNIT/ML Solostar Pen Inject 10 Units into the skin at bedtime. 07/22/21 07/22/22  Ghimire, Henreitta Leber, MD  ?Insulin Pen Needle (PEN NEEDLES) 31G X 6 MM MISC 1 pen by Does not apply route daily. 01/15/21 01/15/22  Vevelyn Francois, NP  ?isosorbide mononitrate (IMDUR) 60 MG 24 hr tablet Take 1 tablet (60 mg total) by mouth daily. 08/05/21   Larey Dresser, MD  ?latanoprost (XALATAN) 0.005 % ophthalmic solution Place 1 drop into both eyes at bedtime. 01/20/20   [provider]  ?meclizine (ANTIVERT) 50 MG tablet Take 0.5 tablets (25 mg total) by mouth 3 (three) times daily as needed for dizziness. 07/28/21   Bo Merino I, NP  ?metolazone (ZAROXOLYN) 2.5 MG tablet Take 1  tablet (2.5 mg total) by mouth as directed. Per Heart Failure Team instructions. 02/17/21 05/25/22  Clegg, Amy D, NP  ?pantoprazole (PROTONIX) 40 MG tablet Take 1 tablet (40 mg total) by mouth 2 (two) times daily. 10/07/20   Angiulli, Lavon Paganini, PA-C  ?sertraline (ZOLOFT) 50 MG tablet Take 1 tablet (50 mg total) by mouth daily. 03/18/21 09/14/21  Vevelyn Francois, NP  ?VASCEPA 1 g capsule Take 2 capsules (2 g total) by mouth 2 (two) times daily. 10/07/20   Angiulli, Lavon Paganini, PA-C  ? ? ?Physical Exam: ?Vitals:  ? 08/18/21 2345 08/19/21 0000 08/19/21 0015 08/19/21 0215  ?BP: (!) 172/99 (!) 160/102 (!) 169/101 107/85  ?Pulse: 88 87 90 83  ?Resp: 18 17 19 15   ?Temp:      ?TempSrc:      ?SpO2: 97% 99% 95% 99%  ? ? ?Physical Exam ?Constitutional:   ?   Appearance: He is well-developed.  ?   Comments: overweight  ?HENT:  ?   Head: Normocephalic and atraumatic.  ?Eyes:  ?   Extraocular Movements: Extraocular movements intact.  ?   Pupils: Pupils are equal, round, and reactive to light.  ?Cardiovascular:  ?   Rate and Rhythm: Normal rate and regular rhythm.  ?   Heart sounds:  Murmur heard.  ?Systolic murmur is present with a grade of 2/6.  ?Pulmonary:  ?   Effort: Pulmonary effort is normal.  ?   Breath sounds: Normal breath sounds.  ?Abdominal:  ?   General: Bowel sounds are norma

## 2021-08-19 NOTE — Assessment & Plan Note (Signed)
Elevated BP but he has missed medications.  ? ?Plan Continue home meds ? ? ?

## 2021-08-19 NOTE — Assessment & Plan Note (Signed)
stable °

## 2021-08-19 NOTE — ED Notes (Signed)
Pt ambulated to the restroom.

## 2021-08-25 ENCOUNTER — Telehealth (HOSPITAL_COMMUNITY): Payer: Self-pay

## 2021-08-25 NOTE — Telephone Encounter (Signed)
Called and left patient a detailed voice message to confirm/remind patient of their appointment at the [facility] on 08/26/21. ? ? ?

## 2021-08-26 ENCOUNTER — Telehealth (HOSPITAL_COMMUNITY): Payer: Self-pay

## 2021-08-26 ENCOUNTER — Ambulatory Visit (HOSPITAL_BASED_OUTPATIENT_CLINIC_OR_DEPARTMENT_OTHER)
Admission: RE | Admit: 2021-08-26 | Discharge: 2021-08-26 | Disposition: A | Discharge status: Home / Self Care | Payer: Medicare Other | Source: Ambulatory Visit | Attending: Physician Assistant | Admitting: Physician Assistant

## 2021-08-26 ENCOUNTER — Ambulatory Visit (HOSPITAL_COMMUNITY)
Admission: RE | Admit: 2021-08-26 | Discharge: 2021-08-26 | Disposition: A | Discharge status: Home / Self Care | Payer: Medicare Other | Source: Ambulatory Visit | Attending: Physician Assistant | Admitting: Physician Assistant

## 2021-08-26 ENCOUNTER — Other Ambulatory Visit (HOSPITAL_COMMUNITY): Payer: Self-pay

## 2021-08-26 ENCOUNTER — Encounter (HOSPITAL_COMMUNITY): Payer: Self-pay

## 2021-08-26 VITALS — BP 100/70 | HR 67 | Wt 234.4 lb

## 2021-08-26 DIAGNOSIS — Z8616 Personal history of COVID-19: Secondary | ICD-10-CM | POA: Insufficient documentation

## 2021-08-26 DIAGNOSIS — I480 Paroxysmal atrial fibrillation: Secondary | ICD-10-CM | POA: Diagnosis not present

## 2021-08-26 DIAGNOSIS — I5022 Chronic systolic (congestive) heart failure: Secondary | ICD-10-CM | POA: Diagnosis not present

## 2021-08-26 DIAGNOSIS — G4733 Obstructive sleep apnea (adult) (pediatric): Secondary | ICD-10-CM | POA: Insufficient documentation

## 2021-08-26 DIAGNOSIS — I272 Pulmonary hypertension, unspecified: Secondary | ICD-10-CM | POA: Diagnosis not present

## 2021-08-26 DIAGNOSIS — E785 Hyperlipidemia, unspecified: Secondary | ICD-10-CM | POA: Insufficient documentation

## 2021-08-26 DIAGNOSIS — Z79899 Other long term (current) drug therapy: Secondary | ICD-10-CM | POA: Insufficient documentation

## 2021-08-26 DIAGNOSIS — I4891 Unspecified atrial fibrillation: Secondary | ICD-10-CM | POA: Diagnosis not present

## 2021-08-26 DIAGNOSIS — I428 Other cardiomyopathies: Secondary | ICD-10-CM | POA: Diagnosis not present

## 2021-08-26 DIAGNOSIS — N184 Chronic kidney disease, stage 4 (severe): Secondary | ICD-10-CM

## 2021-08-26 DIAGNOSIS — Z7901 Long term (current) use of anticoagulants: Secondary | ICD-10-CM | POA: Diagnosis not present

## 2021-08-26 DIAGNOSIS — H9190 Unspecified hearing loss, unspecified ear: Secondary | ICD-10-CM | POA: Insufficient documentation

## 2021-08-26 DIAGNOSIS — I77810 Thoracic aortic ectasia: Secondary | ICD-10-CM | POA: Insufficient documentation

## 2021-08-26 DIAGNOSIS — I10 Essential (primary) hypertension: Secondary | ICD-10-CM

## 2021-08-26 DIAGNOSIS — Z86718 Personal history of other venous thrombosis and embolism: Secondary | ICD-10-CM | POA: Diagnosis not present

## 2021-08-26 DIAGNOSIS — Z86711 Personal history of pulmonary embolism: Secondary | ICD-10-CM | POA: Insufficient documentation

## 2021-08-26 DIAGNOSIS — E1122 Type 2 diabetes mellitus with diabetic chronic kidney disease: Secondary | ICD-10-CM | POA: Insufficient documentation

## 2021-08-26 DIAGNOSIS — Z8673 Personal history of transient ischemic attack (TIA), and cerebral infarction without residual deficits: Secondary | ICD-10-CM | POA: Diagnosis not present

## 2021-08-26 DIAGNOSIS — Z7984 Long term (current) use of oral hypoglycemic drugs: Secondary | ICD-10-CM | POA: Diagnosis not present

## 2021-08-26 DIAGNOSIS — I130 Hypertensive heart and chronic kidney disease with heart failure and stage 1 through stage 4 chronic kidney disease, or unspecified chronic kidney disease: Secondary | ICD-10-CM | POA: Insufficient documentation

## 2021-08-26 DIAGNOSIS — Z95810 Presence of automatic (implantable) cardiac defibrillator: Secondary | ICD-10-CM | POA: Diagnosis not present

## 2021-08-26 LAB — BASIC METABOLIC PANEL
Anion gap: 12 (ref 5–15)
BUN: 55 mg/dL — ABNORMAL HIGH (ref 6–20)
CO2: 30 mmol/L (ref 22–32)
Calcium: 9.5 mg/dL (ref 8.9–10.3)
Chloride: 95 mmol/L — ABNORMAL LOW (ref 98–111)
Creatinine, Ser: 3.94 mg/dL — ABNORMAL HIGH (ref 0.61–1.24)
GFR, Estimated: 17 mL/min — ABNORMAL LOW (ref >60)
Glucose, Bld: 176 mg/dL — ABNORMAL HIGH (ref 70–99)
Potassium: 4.8 mmol/L (ref 3.5–5.1)
Sodium: 137 mmol/L (ref 135–145)

## 2021-08-26 LAB — ECHOCARDIOGRAM COMPLETE
AR max vel: 2.46 cm2
AV Peak grad: 5.2 mmHg
Ao pk vel: 1.14 m/s
Area-P 1/2: 4.39 cm2
Calc EF: 41.8 %
MV M vel: 4.74 m/s
MV Peak grad: 89.9 mmHg
S' Lateral: 4.2 cm
Single Plane A2C EF: 40.5 %
Single Plane A4C EF: 42.9 %

## 2021-08-26 LAB — BRAIN NATRIURETIC PEPTIDE: B Natriuretic Peptide: 99.9 pg/mL (ref 0.0–100.0)

## 2021-08-26 NOTE — Progress Notes (Addendum)
Paramedicine Encounter   Patient ID: Troy Rush , male,   DOB: Oct 10, 1970,50 y.o.,  MRN: [id number]  Pt had ECHO today. -cardio will have to read it.  He has had af ew trips to ER ref covid.  He is much better now.  Getting his new CPAP machine tomor.  Still unable to get his freestyle meter due to insurance. He is not been checking his CBG at home. Will have to send message again to provider to see if they can assist.  He has 3 wks left of bubble packs.  Will see him in 2 wks.   Met patient in clinic today with provider.  Weight @ clinic-234 B/P-100/70 P-67 [id number]  [facility], [place] 08/26/2021

## 2021-08-26 NOTE — Telephone Encounter (Signed)
Per lab notes med changes as noted:    Scr up and BNP down significantly. Suspect he is dry. He is on 80 mg furosemide BID. Continue 80 mg am and hold afternoon dose for 2 days, then resume afternoon dose at 40 mg daily.    BMET in 10 days   I contacted pt and instructed him on this- he gets bubble packs but I told him which pills it was-he did send me pic of bubble pack- He understands.  Will message triage to get him sch for repeat labs.   [name], [place] 08/26/2021

## 2021-08-26 NOTE — Addendum Note (Signed)
Encounter addended by: Joette Catching, PA-C on: 08/26/2021 2:39 PM  Actions taken: Order Reconciliation Section accessed, Medication note saved, Clinical Note Signed

## 2021-08-26 NOTE — Progress Notes (Addendum)
Advanced Heart Failure Clinic Note   PCP: Kathe Becton, FNP HF Cardiology: Dr. Aundra Dubin  Reason for Visit: 4 week f/u chronic systolic heart failure   Troy Rush is a 51 y.o. with a history of with deafness, HTN, DM, and chronic systolic HF.   Patient had no known cardiac problems until 10/16.  At that time, he developed exertional dyspnea and was admitted to a hospital in [place], [place] with acute systolic CHF.  Echo 10/16 showed EF 15-20%.  Cardiac cath showed no significant coronary disease.  He was diuresed and started on cardiac meds. Subsequently, he moved to [facility].  Now working in a [employer].  Repeat echo in 5/18 showed persistently low EF, 25-30%.  He had [place] placed.   He was admitted briefly with CHF exacerbation in 8/20.    Echo in 9/20 showed EF 30-35% with diffuse hypokinesis.   Ultrasound in 4/21 showed right leg DVT and CT showed a PE.  No obvious trigger (no long trip, no surgery, no prolonged immobility).  He was started on apixaban.   He quit taking his meds in 8/21 and was drinking heavily due to depression, later restarted meds.  In 10/21, he was admitted with DKA and atrial fibrillation/RVR (1st known episode).  He converted to NSR spontaneously.  Troy Rush was stopped due to AKI.  He had melena/hematochezia and EGD showed gastritis/esophagitis.  Apixaban was stopped.  He went home but then was re-admitted later in 10/21 with bilateral PEs.   Echo in 10/21 showed EF 45%, mild global hypokinesis, moderate LVH, mildly decreased RV systolic function.    On 05/05/20, he had a near-syncopal event and went to the ER, no events on ICD interrogation.  ?Orthostatic.  No further dizziness/lightheadedness.  Repeat RHC (2/22) showed optimized filling pressures, mild pulmonary hypertension, preserved cardiac output. Suspect elevated PA pressure may be related to OSA.    Admitted 05/3298  With A/C systolic heart failure. Diuresed with IV lasix and transitioned  to lasix 60 mg daily. Discharge weight 232 pounds.   09/10/20 he presented for scheduled cardiomems and was noted to be in A fib RVR. RHC with succesful cardiomems implant. Stable hemodynamics on cath. Admitted from cath lab with A fib RVR and started on amio drip. Chemically converted to NSR was transitioned to amio 200 mg bid.  Also incidentally found to be COVID +.   09/30/20, he was admitted w/ acute subcortical CVA. Unable to get cMRI given ICD. Neurology felt related to small vessel disease. Eliquis continued. He was d/c to [facility] for rehab.  Echo in 6/22 showed EF 35-40%.    He had atrial fibrillation ablation in 11/22.   Admit 3/22 for hypertensive urgency. Chest CT negative for dissection and PE. Found to be in acute CHF. Hospitalization c/b AKI felt due to CIN. SCr bumped to 4 (BL ~3) diuretics were briefly held and SCr showed downward trend. Antihypertensive regimen was increased further.   Diagnosed with COVID-19 on 05/04. Admitted overnight 05/10 with CP and elevated troponin. Suspected 2/2 N, V with hematemesis and dehydration. He was significantly hypertensive with Sbp 190s. Had not been able to keep meds down.  Here today for hospital follow-up.  He is accompanied by an ASL interpreter and EMT, Troy Rush. Has been feeling well. No recurrent nausea or vomiting. Weight is down 6 lb from last visit, 234 lb today. He has been walking up hill to the bus stop without difficulty, gets tired on occasion. No orthopnea, PND  or lower extremity edema. No dizziness.   Taking all medications as prescribed.  Old CPAP machine isn't working. He is waiting on a new one to arrive.   Cardiomems 20, goal 22 Device interrogation. HL score 13, below threshold  Labs (9/17): BNP 477, K 4.5 => 4.2, creatinine 0.92 => 1.1, BNP 1009, HIV negative, SPEP negative.  Labs (10/17): K 5.2, creatinine 1.10, hgb 12.5 Labs (03/28/2016): K 5.3 Creatinine 1.07  Labs (3/18): digoxin < 0.2, K 4.1, creatinine 0.97 Labs  (9/18): hgb 12.2, K 4, creatinine 0.99, BNP 139 Labs (1/19): hgb 11.2, K 4.8, creatinine 1.26 Labs (8/20): K 3.7, creatinine 1.69 Labs (5/21): K 5, creatinine 1.4, LDL 192 Labs (10/21): K 4.1, creatinine 1.77, LDL 180, TGs 138 Labs (11/21): K 4.5, creatinine 2.68 Labs (12/21): K 5.1, creatinine 2.25 Labs (04/30/20): K 4.7, creatinine 2.7 Labs (05/05/20): K 4.0, creatinine 2.15 Labs (05/11/20): K 4.2, creatinine 2.43, LDL 76, HDL 38, TGs 220 Labs (2/22): K 4.4, creatinine 2.69 Labs (2/22): SCr 2.56, GFR 29, K 3.9  Labs (8/22): K 5.0, creatinine 2.84, LDL 205, HDL 42, TGs 384 Labs (9/22): K 4.4, creatinine 3.06 Labs (1/23): K 4.5, creatinine 3.19 Labs (3/23): Hospital SCr 2.1>2.9>4.1>4.4>3.5>2.87>2.7 (felt CIN)   PMH: 1. HTN 2. Diabetes 3. Hyperlipidemia 4. Deafness since age 29: Needs sign language interpreter.  5. Chronic systolic CHF: Nonischemic cardiomyopathy.  Diagnosis 10/16 in [place], [place] (admitted with acute systolic CHF).  SPEP and HIV negative.  [place].  - Echo (10/16): EF 15-20%, mild LVH, mild Troy, normal RV size and systolic function.  - LHC (10/16): No significant CAD.  - CPX (12/17): Mild functional limitation.  - 04/2016 CMRI: EF 27%. RV normal. No evidence of infiltrative disease, myocarditis, or MI.  - Echo (5/18): EF 25-30%, mild LV dilation, mild LVH, normal RV size with mildly decreased - Echo (9/20): EF 30-35%, mild LVH, diffuse hypokinesis, normal RV size and systolic function.  - Echo (10/21): EF 45%, mild global hypokinesis, moderate LVH, mildly decreased RV systolic function.  - RHC (2/22): Optimized filling pressures, mild pulmonary hypertension, preserved cardiac output. Continue current diuretics.  Suspect elevated PA pressure may be related to OSA.   - Echo (6/22): EF 35-40%, global hypokinesis, normal RV.  - Cardiomems 6. Depression. 7. Colitis episodes.  8. CKD stage 4  9. DVT/PE in 4/21: No obvious trigger.  - Recurrent PE in 10/21, had  been off anticoagulation.  10. Atrial fibrillation: Paroxysmal.  - Atrial fibrillation ablation in 11/22.  11. Gastritis/esophagitis: 10/21.  12. ETOH abuse 13. OSA: Cpap 14. CVA 6/22  SH: From [place] originally.  Lives in [place] until 2017, then moved to [place].  Nonsmoker.  Unemployed.  H/o ETOH abuse.    FH: No known cardiac disease.   ROS: All systems reviewed and negative except as per HPI.   Current Outpatient Medications  Medication Sig Dispense Refill   acetaminophen (TYLENOL) 325 MG tablet Take 2 tablets (650 mg total) by mouth every 4 (four) hours as needed for mild pain (or temp > 37.5 C (99.5 F)).     apixaban (ELIQUIS) 5 MG TABS tablet TAKE 1 TABLET(5 MG) BY MOUTH TWICE DAILY (Patient taking differently: Take 5 mg by mouth daily.) 60 tablet 11   atorvastatin (LIPITOR) 80 MG tablet Take 1 tablet (80 mg total) by mouth at bedtime. 30 tablet 11   carvedilol (COREG) 25 MG tablet TAKE 1 TABLET (25 MG TOTAL) BY MOUTH 2 (TWO) TIMES DAILY. 60 tablet 0  Cholecalciferol (D3-1000 PO) Take 1,000 Units by mouth daily.     Continuous Blood Gluc Sensor (FREESTYLE LIBRE 3 SENSOR) MISC 1 application by Does not apply route every 14 (fourteen) days. Place 1 sensor on the skin every 14 days. Use to check glucose continuously (Patient not taking: Reported on 07/27/2021) 2 each 5   dapagliflozin propanediol (FARXIGA) 10 MG TABS tablet Take 1 tablet (10 mg total) by mouth daily before breakfast. 90 tablet 3   Evolocumab (REPATHA SURECLICK) 063 MG/ML SOAJ Inject 140 mg into the skin every 14 (fourteen) days. 2 mL 11   fenofibrate 54 MG tablet Take 1 tablet (54 mg total) by mouth daily. 30 tablet 6   furosemide (LASIX) 80 MG tablet Take 1 tablet (80 mg total) by mouth 2 (two) times daily. 90 tablet 3   hydrALAZINE (APRESOLINE) 50 MG tablet Take 1 tablet (50 mg total) by mouth 3 (three) times daily. 260 tablet 3   insulin glargine (LANTUS SOLOSTAR) 100 UNIT/ML Solostar Pen Inject 10 Units into the  skin at bedtime. (Patient taking differently: Inject 35 Units into the skin at bedtime.) 9 mL 3   Insulin Pen Needle (PEN NEEDLES) 31G X 6 MM MISC 1 pen by Does not apply route daily. 100 each 11   isosorbide mononitrate (IMDUR) 60 MG 24 hr tablet Take 1 tablet (60 mg total) by mouth daily. 90 tablet 3   latanoprost (XALATAN) 0.005 % ophthalmic solution Place 1 drop into both eyes at bedtime.     meclizine (ANTIVERT) 25 MG tablet Take 25 mg by mouth 3 (three) times daily as needed for dizziness.     meclizine (ANTIVERT) 50 MG tablet Take 0.5 tablets (25 mg total) by mouth 3 (three) times daily as needed for dizziness. 30 tablet 0   metolazone (ZAROXOLYN) 2.5 MG tablet Take 1 tablet (2.5 mg total) by mouth as directed. Per Heart Failure Team instructions. (Patient not taking: Reported on 08/19/2021) 6 tablet 3   ondansetron (ZOFRAN) 4 MG tablet Take 1 tablet (4 mg total) by mouth every 8 (eight) hours as needed for nausea or vomiting. 30 tablet 0   pantoprazole (PROTONIX) 40 MG tablet Take 1 tablet (40 mg total) by mouth 2 (two) times daily. 180 tablet 3   sertraline (ZOLOFT) 50 MG tablet Take 1 tablet (50 mg total) by mouth daily. 90 tablet 1   VASCEPA 1 g capsule Take 2 capsules (2 g total) by mouth 2 (two) times daily. 120 capsule 5   No current facility-administered medications for this visit.   There were no vitals taken for this visit.   Wt Readings from Last 3 Encounters:  08/19/21 107.5 kg (236 lb 15.9 oz)  08/12/21 107.5 kg (237 lb)  07/28/21 107.5 kg (237 lb)    PHYSICAL EXAM: General:  No distress. Ambulated into clinic. HEENT: normal Neck: supple. no JVD. Carotids 2+ bilat; no bruits.  Cor: PMI nondisplaced. Regular rate & rhythm. No rubs, gallops or murmurs. Lungs: clear Abdomen: soft, nontender, nondistended.  Extremities: no cyanosis, clubbing, rash, edema Neuro: alert & orientedx3, cranial nerves grossly intact. moves all 4 extremities w/o difficulty. Affect  pleasant   Assessment/Plan: 1. Chronic systolic CHF: Nonischemic cardiomyopathy, etiology uncertain. No CAD on 10/16 cath. Consider prior ETOH or HTN.  He has a [place].  cMRI in 1/18 with EF 27%, RV normal, no evidence of infiltrative disease, myocarditis, or MI. Echo in 9/20 showed EF 30-35%.  Echo in 10/21 showed EF up to 45%  with mild RV dysfunction. Repeat RHC (2/22) with optimized filling pressures, preserved CO and moderate pulmonary hypertension-->likely due to untreated sleep apnea. Cardiomems placed 6/22.  Echo in 6/22 with EF 35-40%.   - NYHA II. He is not volume overloaded by exam. HL 13, below threshold. dPAP reading today was 20, goal 22. Has been more adherent with readings. CHF is complicated by CKD stage IV.  - Continue Lasix 80 mg bid.   - Continue Farxiga 10 mg daily. - Continue Coreg 25 mg bid.    - Continue hydralazine 50 mg tid + Imdur 60 mg daily.   - Off Entresto and spironolactone with elevated creatinine.  - Echo today pending. - Labs today 2. Type II diabetes: management per PCP. - On Farxiga. 3. HTN: Developed hypotension/ orthostasis w/ amlodipine. Stable today. - Continue meds as above. 4. Deafness: Sign language interpreter present at visit.  5. CKD: Stage 4.  - recent AKI 2/2 CIN after chest CT. Scr bumped to 4.4 (improved to 2.70) - Recheck BMP today  - He is now followed by Nephrology. 6. PE/DVT: 4/21.  Unclear cause, no prolonged trip, prolonged immobility, or history of malignancy. No FH clotting. Recurrent PE in 10/21 while Eliquis on hold with bleeding from esophagitis/gastritis.  Do no think this was an Eliquis failure.  - Continue apixaban, think he will need long-term with no clear trigger for VTE + AFib. No bleeding issues.  7. Hyperlipidemia: On atorvastatin, fenofibrate and Vascepa.  8. Atrial fibrillation: First noted in setting of DKA in 10/21.  Had been on amiodarone but discontinued 5/22. Had recurrent Afib 6/22 and amiodarone  restarted. He had AF ablation in 11/22. No longer on amiodarone. RRR on exam today  - Continue Eliquis.  9. ETOH abuse: ETOH may have played a role in both cardiomyopathy and atrial fibrillation. Continues to drink socially. Cessation advised  10. Severe OSA: Using CPAP. Awaiting new machine. 11. Noncompliance:  Previous medication noncompliance issues. - This has improved with HF Paramedicine program 12. CVA: 6/22, subcortical CVA. Unable to get cMRI given ICD. Neurology felt related to small vessel disease. No residual deficits - Continue Eliquis.   Follow-up: Dr. Aundra Dubin in 3 months  [facility], Lynder Parents, PA-C 08/26/2021

## 2021-08-26 NOTE — Patient Instructions (Signed)
Labs done today. We will contact you only if your labs are abnormal.  No medication changes were made. Please continue all current medications as prescribed.  Your physician recommends that you schedule a follow-up appointment in: 3 months with Dr. Aundra Dubin  If you have any questions or concerns before your next appointment please send [place] a message through [facility] or call our office at [phone].    TO LEAVE A MESSAGE FOR THE NURSE SELECT OPTION 2, PLEASE LEAVE A MESSAGE INCLUDING: YOUR NAME DATE OF BIRTH CALL BACK NUMBER REASON FOR CALL**this is important as we prioritize the call backs  YOU WILL RECEIVE A CALL BACK THE SAME DAY AS LONG AS YOU CALL BEFORE 4:00 PM   Do the following things EVERYDAY: Weigh yourself in the morning before breakfast. Write it down and keep it in a log. Take your medicines as prescribed Eat low salt foods--Limit salt (sodium) to 2000 mg per day.  Stay as active as you can everyday Limit all fluids for the day to less than 2 liters   At the [facility], you and your health needs are our priority. As part of our continuing mission to provide you with exceptional heart care, we have created designated Provider Care Teams. These Care Teams include your primary Cardiologist (physician) and Advanced Practice Providers (APPs- Physician Assistants and Nurse Practitioners) who all work together to provide you with the care you need, when you need it.   You may see any of the following providers on your designated Care Team at your next follow up: Dr Glori Bickers Dr Haynes Kerns, NP Lyda Jester, [place] Audry Riles, PharmD   Please be sure to bring in all your medications bottles to every appointment.

## 2021-08-27 ENCOUNTER — Telehealth (HOSPITAL_COMMUNITY): Payer: Self-pay

## 2021-08-27 MED ORDER — FUROSEMIDE 80 MG PO TABS
ORAL_TABLET | ORAL | 3 refills | Status: DC
Start: 2021-08-27 — End: 2021-09-20

## 2021-08-27 NOTE — Telephone Encounter (Signed)
Patient advised and verbalized understanding. Med list updated to reflect changes. Patient has pending lab visit 5/30.   Meds ordered this encounter  Medications   furosemide (LASIX) 80 MG tablet    Sig: Take 1 tablet (80 mg total) by mouth every morning AND 0.5 tablets (40 mg total) every evening.    Dispense:  90 tablet    Refill:  3    Please cancel all previous orders for current medication. Change in dosage or pill size.

## 2021-08-27 NOTE — Telephone Encounter (Signed)
-----   Message from Joette Catching, [place] sent at 08/26/2021  1:36 PM EDT -----  Scr up and BNP down significantly. Suspect he is dry. He is on 80 mg furosemide BID. Continue 80 mg am and hold afternoon dose for 2 days, then resume afternoon dose at 40 mg daily.   BMET in 10 days

## 2021-09-07 ENCOUNTER — Other Ambulatory Visit (HOSPITAL_COMMUNITY): Payer: Medicare Other

## 2021-09-07 ENCOUNTER — Encounter: Payer: Self-pay | Admitting: Cardiology

## 2021-09-07 ENCOUNTER — Ambulatory Visit (INDEPENDENT_AMBULATORY_CARE_PROVIDER_SITE_OTHER): Payer: Medicare Other | Admitting: Cardiology

## 2021-09-07 VITALS — BP 138/82 | HR 76 | Ht 70.0 in | Wt 230.0 lb

## 2021-09-07 DIAGNOSIS — I428 Other cardiomyopathies: Secondary | ICD-10-CM | POA: Diagnosis not present

## 2021-09-07 DIAGNOSIS — I639 Cerebral infarction, unspecified: Secondary | ICD-10-CM

## 2021-09-07 DIAGNOSIS — Z79899 Other long term (current) drug therapy: Secondary | ICD-10-CM

## 2021-09-07 LAB — BASIC METABOLIC PANEL
BUN/Creatinine Ratio: 16 (ref 9–20)
BUN: 51 mg/dL — ABNORMAL HIGH (ref 6–24)
CO2: 23 mmol/L (ref 20–29)
Calcium: 9.4 mg/dL (ref 8.7–10.2)
Chloride: 97 mmol/L (ref 96–106)
Creatinine, Ser: 3.11 mg/dL — ABNORMAL HIGH (ref 0.76–1.27)
Glucose: 228 mg/dL — ABNORMAL HIGH (ref 70–99)
Potassium: 4.4 mmol/L (ref 3.5–5.2)
Sodium: 136 mmol/L (ref 134–144)
eGFR: 23 mL/min/{1.73_m2} — ABNORMAL LOW (ref >59)

## 2021-09-07 NOTE — Patient Instructions (Signed)
Medication Instructions:  Your physician recommends that you continue on your current medications as directed. Please refer to the Current Medication list given to you today.  *If you need a refill on your cardiac medications before your next appointment, please call your pharmacy*   Lab Work: Today: BMET  If you have labs (blood work) drawn today and your tests are completely normal, you will receive your results only by: [place] (if you have MyChart) OR A paper copy in the mail If you have any lab test that is abnormal or we need to change your treatment, we will call you to review the results.   Testing/Procedures: None ordered   Follow-Up: At [facility], you and your health needs are our priority.  As part of our continuing mission to provide you with exceptional heart care, we have created designated Provider Care Teams.  These Care Teams include your primary Cardiologist (physician) and Advanced Practice Providers (APPs -  Physician Assistants and Nurse Practitioners) who all work together to provide you with the care you need, when you need it.   Remote monitoring is used to monitor your Pacemaker or ICD from home. This monitoring reduces the number of office visits required to check your device to one time per year. It allows [place] to keep an eye on the functioning of your device to ensure it is working properly. You are scheduled for a device check from home on 09/20/21. You may send your transmission at any time that day. If you have a wireless device, the transmission will be sent automatically. After your physician reviews your transmission, you will receive a postcard with your next transmission date.  Your next appointment:   6 month(s)  The format for your next appointment:   In Person  Provider:   You will see one of the following Advanced Practice Providers on your designated Care Team:   Tommye Standard, [place] Legrand Como "Oda Kilts, [place] {  Thank you for  choosing [facility]!!   Trinidad Curet, RN [phone]    Other Instructions  Important Information About Sugar

## 2021-09-07 NOTE — Progress Notes (Signed)
Electrophysiology Office Note   Date:  09/07/2021   ID:  Troy Rush, DOB March 26, 1971, MRN [id number]  PCP:  Teena Dunk, NP  Cardiologist:  Aundra Dubin Primary Electrophysiologist: Meridee Score, MD    Chief Complaint: AF   History of Present Illness: Troy Rush is a 51 y.o. male who is being seen today for the evaluation of AF at the request of Vevelyn Francois, NP. Presenting today for electrophysiology evaluation.  He has a history significant for hypertension, hyperlipidemia, diabetes, chronic systolic heart failure due to nonischemic cardiomyopathy, pulmonary embolism, atrial fibrillation.  He is status post [place].  On 09/10/2020 he was scheduled for cardio mems but was noted to be in rapid atrial fibrillation.  He was started on amiodarone and had a cardioversion.  He is status post atrial fibrillation ablation 03/02/2021.  He was admitted to the hospital 09/30/2020 with subcortical CVA.  Given his ICD was unable to get an MRI.  Neurology felt this was due to small vessel disease and Eliquis was continued.  He has a history significant for hypertension, hyperlipidemia, diabetes, chronic systolic heart failure due to nonischemic cardiomyopathy, pulmonary embolism, and atrial fibrillation.  He is status post [place].  On 09/10/2020 he was scheduled for a cardio mems and was noted to be in rapid atrial fibrillation.    He had stable hemodynamics on catheterization.  He was started on amiodarone which converted him to sinus rhythm.  He is now status post atrial fibrillation ablation 03/02/2021.  He was hospitalized May 2023 with nausea and vomiting.  This thought due to COVID 19 infection.  His troponin was found to be elevated, but he was ruled out for ACS.  Today, denies symptoms of palpitations, chest pain, shortness of breath, orthopnea, PND, lower extremity edema, claudication, dizziness, presyncope, syncope, bleeding, or neurologic  sequela. The patient is tolerating medications without difficulties.  Today he feels well.  He has no chest pain or shortness of breath.  He is able to do all of his daily activities.  He is eating healthier, less eating out and more cooking in his house.   Past Medical History:  Diagnosis Date   AICD (automatic cardioverter/defibrillator) present 10/14/2016   Bilateral deafness    Bronchitis    CHF (congestive heart failure) ([place])    COVID-19    Depression    Diabetes mellitus without complication ([place])    DVT, lower extremity ([place]) 07/12/2019   Dyslipidemia    ESRD (end stage renal disease) ([place])    Reports stage IV CKD, Has not started dialysis d/t inability to communicate w/ nephrologist on his first appointment   Headache 08/2019   Hypertension    ICD (implantable cardioverter-defibrillator) in place    Insomnia 08/2019   NICM (nonischemic cardiomyopathy) ([place]) 09/15/2016   Pancreatitis    Renal disorder    Sleep apnea    Vitamin D deficiency 08/2019   Past Surgical History:  Procedure Laterality Date   ATRIAL FIBRILLATION ABLATION N/A 03/02/2021   Procedure: ATRIAL FIBRILLATION ABLATION;  Surgeon: Constance Haw, MD;  Location: [facility];  Service: Cardiovascular;  Laterality: N/A;   BACK SURGERY     CARDIAC CATHETERIZATION     ESOPHAGOGASTRODUODENOSCOPY (EGD) WITH PROPOFOL N/A 02/02/2020   Procedure: ESOPHAGOGASTRODUODENOSCOPY (EGD) WITH PROPOFOL;  Surgeon: Arta Silence, MD;  Location: [facility];  Service: Endoscopy;  Laterality: N/A;   ICD IMPLANT  10/14/2016   ICD IMPLANT N/A 10/14/2016  Procedure: ICD Implant;  Surgeon: Deboraha Sprang, MD;  Location: [facility];  Service: Cardiovascular;  Laterality: N/A;   PRESSURE SENSOR/CARDIOMEMS N/A 09/10/2020   Procedure: PRESSURE SENSOR/CARDIOMEMS;  Surgeon: Larey Dresser, MD;  Location: [facility];  Service: Cardiovascular;  Laterality: N/A;   RIGHT HEART CATH N/A 05/21/2020   Procedure:  RIGHT HEART CATH;  Surgeon: Larey Dresser, MD;  Location: [facility];  Service: Cardiovascular;  Laterality: N/A;   WRIST SURGERY       Current Outpatient Medications  Medication Sig Dispense Refill   acetaminophen (TYLENOL) 325 MG tablet Take 2 tablets (650 mg total) by mouth every 4 (four) hours as needed for mild pain (or temp > 37.5 C (99.5 F)).     apixaban (ELIQUIS) 5 MG TABS tablet TAKE 1 TABLET(5 MG) BY MOUTH TWICE DAILY 60 tablet 11   atorvastatin (LIPITOR) 80 MG tablet Take 1 tablet (80 mg total) by mouth at bedtime. 30 tablet 11   carvedilol (COREG) 25 MG tablet TAKE 1 TABLET (25 MG TOTAL) BY MOUTH 2 (TWO) TIMES DAILY. 60 tablet 0   Cholecalciferol (D3-1000 PO) Take 1,000 Units by mouth daily.     Continuous Blood Gluc Sensor (FREESTYLE LIBRE 3 SENSOR) MISC 1 application by Does not apply route every 14 (fourteen) days. Place 1 sensor on the skin every 14 days. Use to check glucose continuously 2 each 5   dapagliflozin propanediol (FARXIGA) 10 MG TABS tablet Take 1 tablet (10 mg total) by mouth daily before breakfast. 90 tablet 3   Evolocumab (REPATHA SURECLICK) 269 MG/ML SOAJ Inject 140 mg into the skin every 14 (fourteen) days. 2 mL 11   fenofibrate 54 MG tablet Take 1 tablet (54 mg total) by mouth daily. 30 tablet 6   furosemide (LASIX) 80 MG tablet Take 1 tablet (80 mg total) by mouth every morning AND 0.5 tablets (40 mg total) every evening. 90 tablet 3   hydrALAZINE (APRESOLINE) 50 MG tablet Take 1 tablet (50 mg total) by mouth 3 (three) times daily. 260 tablet 3   insulin glargine (LANTUS SOLOSTAR) 100 UNIT/ML Solostar Pen Inject 10 Units into the skin at bedtime. (Patient taking differently: Inject 35 Units into the skin at bedtime.) 9 mL 3   Insulin Pen Needle (PEN NEEDLES) 31G X 6 MM MISC 1 pen by Does not apply route daily. 100 each 11   isosorbide mononitrate (IMDUR) 60 MG 24 hr tablet Take 1 tablet (60 mg total) by mouth daily. 90 tablet 3   latanoprost  (XALATAN) 0.005 % ophthalmic solution Place 1 drop into both eyes at bedtime.     meclizine (ANTIVERT) 25 MG tablet Take 25 mg by mouth 3 (three) times daily as needed for dizziness.     metolazone (ZAROXOLYN) 2.5 MG tablet Take 1 tablet (2.5 mg total) by mouth as directed. Per Heart Failure Team instructions. 6 tablet 3   ondansetron (ZOFRAN) 4 MG tablet Take 1 tablet (4 mg total) by mouth every 8 (eight) hours as needed for nausea or vomiting. 30 tablet 0   sertraline (ZOLOFT) 50 MG tablet Take 1 tablet (50 mg total) by mouth daily. 90 tablet 1   VASCEPA 1 g capsule Take 2 capsules (2 g total) by mouth 2 (two) times daily. 120 capsule 5   No current facility-administered medications for this visit.    Allergies:   Lisinopril   Social History:  The patient  reports that he has never smoked. He  has never used smokeless tobacco. He reports that he does not currently use alcohol. He reports that he does not use drugs.   Family History:  The patient's family history includes Healthy in his father; Hypertension in his mother; Stroke in his father.   ROS:  Please see the history of present illness.   Otherwise, review of systems is positive for none.   All other systems are reviewed and negative.   PHYSICAL EXAM: VS:  BP 138/82   Pulse 76   Ht 5\' 10"  (1.778 m)   Wt 230 lb (104.3 kg)   SpO2 96%   BMI 33.00 kg/m  , BMI Body mass index is 33 kg/m. GEN: Well nourished, well developed, in no acute distress  HEENT: normal  Neck: no JVD, carotid bruits, or masses Cardiac: RRR; no murmurs, rubs, or gallops,no edema  Respiratory:  clear to auscultation bilaterally, normal work of breathing GI: soft, nontender, nondistended, + BS MS: no deformity or atrophy  Skin: warm and dry, device site well healed Neuro:  Strength and sensation are intact Psych: euthymic mood, full affect  EKG:  EKG is not ordered today. Personal review of the ekg ordered 08/18/21 shows sinus rhythm, T wave  inversions  Personal review of the device interrogation today. Results in [place]: 05/25/2021: TSH 8.278 07/22/2021: Magnesium 2.0 08/18/2021: ALT 35; Platelets 235 08/19/2021: Hemoglobin 11.5 08/26/2021: B Natriuretic Peptide 99.9; BUN 55; Creatinine, Ser 3.94; Potassium 4.8; Sodium 137    Lipid Panel     Component Value Date/Time   CHOL 136 07/28/2021 1042   TRIG 242 (H) 07/28/2021 1042   HDL 45 07/28/2021 1042   CHOLHDL 3.0 07/28/2021 1042   CHOLHDL 7.7 10/01/2020 0235   VLDL UNABLE TO CALCULATE IF TRIGLYCERIDE OVER 400 mg/dL 10/01/2020 0235   LDLCALC 53 07/28/2021 1042   LDLDIRECT 30 02/25/2021 0956   LDLDIRECT 189.9 (H) 10/01/2020 0235     Wt Readings from Last 3 Encounters:  09/07/21 230 lb (104.3 kg)  08/26/21 234 lb 6.4 oz (106.3 kg)  08/19/21 236 lb 15.9 oz (107.5 kg)      Other studies Reviewed: Additional studies/ records that were reviewed today include: TTE 08/26/21 Review of the above records today demonstrates:   1. Left ventricular ejection fraction, by estimation, is 35 to 40%. The  left ventricle has moderately decreased function. The left ventricle  demonstrates global hypokinesis. There is moderate left ventricular  hypertrophy. Left ventricular diastolic  parameters are consistent with Grade I diastolic dysfunction (impaired  relaxation). The average left ventricular global longitudinal strain is  -14.6 %. The global longitudinal strain is abnormal.   2. Right ventricular systolic function is normal. The right ventricular  size is normal.   3. Left atrial size was mildly dilated.   4. The mitral valve is normal in structure. Trivial mitral valve  regurgitation. No evidence of mitral stenosis.   5. The aortic valve is tricuspid. Aortic valve regurgitation is not  visualized. No aortic stenosis is present.   6. Aortic dilatation noted. There is mild dilatation of the ascending  aorta, measuring 40 mm.   7. The inferior vena cava is  normal in size with greater than 50%  respiratory variability, suggesting right atrial pressure of 3 mmHg.    ASSESSMENT AND PLAN:  1.  Persistent atrial fibrillation: Currently on Eliquis 5 mg twice daily.  CHA2DS2-VASc of 3.  Status post ablation 03/02/2021.  No further episodes.  Continue with current management  2.  Chronic systolic heart failure due to nonischemic cardiomyopathy: Currently not on medical therapy per primary cardiology.  Status post [place].  Device functioning appropriately.  No changes at this time.  3.  Hypertension: Currently well controlled  4.  Secondary hypercoagulable state: Currently on Eliquis 5 mg for atrial fibrillation as above.  Current medicines are reviewed at length with the patient today.   The patient does not have concerns regarding his medicines.  The following changes were made today: None  Labs/ tests ordered today include:  Orders Placed This Encounter  Procedures   Basic metabolic panel      Disposition:   FU with Laureano Hetzer 6 months  Signed, Joli Koob Meredith Leeds, MD  09/07/2021 9:20 AM     [facility] [address] [place] [address] [phone] (office) [phone] (fax)

## 2021-09-14 ENCOUNTER — Ambulatory Visit (HOSPITAL_BASED_OUTPATIENT_CLINIC_OR_DEPARTMENT_OTHER): Payer: Medicare Other | Admitting: Cardiology

## 2021-09-15 ENCOUNTER — Other Ambulatory Visit: Payer: Self-pay | Admitting: *Deleted

## 2021-09-15 NOTE — Patient Outreach (Signed)
[facility]) Care Management  09/15/2021  LEOVARDO THOMAN [birth year] [id number]   Telephone Assessment- Declined  RN spoke with pt today and verified credentials through an interpreter pt uses with all incoming calls. Pt expressed an understanding of explained services. RN inquired further on the purpose for the call as pt confirmed no related issues with his following medical conditions indicated all her managed well (HTN, CHF, DM and no needs with  his medications). Denies any falls or safety needs with any DME or ability to ambulate through his apartment. Pt states he is seeking another place to stay and verified he has a case worker available. RN strongly encouraged pt to reach out to his casework for further assistance based upon his available benefits. RN inquires on any other needs and extended another offer to consult [facility] social worker if pt had any other community needs. Pt opt to declined any available services at this time with no needs however appreciative.  RN offer to mail information on [facility] services for possible use at a later date or time however pt opt to decline. RN has made pt aware that [facility] will contact his provider concerning his decline for [facility] services at this time.  No further needs as this case will be closed.  Raina Mina, RN Care Management Coordinator [place] Office [phone]

## 2021-09-16 ENCOUNTER — Other Ambulatory Visit (HOSPITAL_COMMUNITY): Payer: Self-pay

## 2021-09-16 NOTE — Progress Notes (Addendum)
Paramedicine Encounter    Patient ID: Troy Rush, male    DOB: 09/22/1970, 51 y.o.   MRN: [id number]  Pt reports he is doing ok. He reports no missed doses of his meds.  Pharmacy will deliver his repatha today.  Also need to get the dosage correct in his next bubble packs of meds.  His weight is increasing from last clinic visit, I advised him to slow down on the eating, he does not appear to have swelling noted.  Pt denies increased sob, no c/p, no dizziness.   BP (!) 150/84   Pulse 74   Resp 16   Wt 237 lb (107.5 kg)   SpO2 98%   BMI 34.01 kg/m  Weight yesterday-? Last visit weight-230 @ office   Patient Care Team: Bo Merino I, NP as PCP - General (Nurse Practitioner) Larey Dresser, MD as PCP - Cardiology (Cardiology) Conrad Ames, NP as Nurse Practitioner (Cardiology) Larey Dresser, MD as Consulting Physician (Cardiology) Jorge Ny, LCSW as Social Worker (Licensed Clinical Social Worker)  Patient Active Problem List   Diagnosis Date Noted   COVID-19 virus infection 08/19/2021   Acute on chronic systolic CHF (congestive heart failure) ([place]) 07/18/2021   Acute respiratory failure with hypoxia ([place]) 07/18/2021   Intractable nausea and vomiting 07/17/2021   Chest pain 07/17/2021   Type 2 diabetes mellitus with chronic kidney disease, with long-term current use of insulin ([place]) 07/17/2021   Depression 07/17/2021   Paroxysmal atrial fibrillation ([place]) 07/17/2021   Hypertensive urgency 07/17/2021   Alcohol abuse 07/17/2021   Subcortical infarction ([place]) 10/03/2020   Cerebral thrombosis with cerebral infarction 10/01/2020   Unilateral weakness 09/30/2020   History of atrial fibrillation 09/30/2020   History of DVT (deep vein thrombosis) 09/30/2020   Chronic kidney disease, stage 3b ([place]) 07/08/2020   Acute CHF (congestive heart failure) ([place]) 07/07/2020   CKD (chronic kidney disease), stage IV ([place]) 07/07/2020   ICD (implantable cardioverter-defibrillator)  in place 05/14/2020   Pulmonary embolism ([place]) 02/05/2020   AKI (acute kidney injury) ([place])    Chronic combined systolic and diastolic heart failure ([place])    Atrial fibrillation with RVR ([place]) 02/01/2020   NSVT (nonsustained ventricular tachycardia) ([place]) 02/01/2020   Acute GI bleeding 02/01/2020   DKA (diabetic ketoacidosis) ([place]) 02/01/2020   Bilateral deafness 08/15/2019   Type 2 diabetes mellitus with complication, with long-term current use of insulin ([place]) 08/15/2019   Hemoglobin A1C between 7% and 9% indicating borderline diabetic control ([place]) 08/15/2019   Insomnia 08/15/2019   Abdominal pain 11/14/2018   Ischemic cardiomyopathy 10/14/2016   NICM (nonischemic cardiomyopathy) ([place]) 68/34/1962   Chronic systolic CHF (congestive heart failure) ([place]) 05/30/2016   HTN (hypertension) 05/30/2016   Deaf 05/30/2016   Hyperkalemia 05/30/2016   Major depressive disorder, recurrent episode ([place]) 03/21/2016   Bronchitis 01/21/2015   Dyslipidemia 01/21/2015   Vitamin D deficiency 12/19/2011   Personal history of other infectious and parasitic diseases 10/25/2011   Anxiety disorder 05/12/2011   Hyperlipidemia 05/12/2011   Hypertriglyceridemia 05/12/2011   Obstructive sleep apnea 05/12/2011    Current Outpatient Medications:    acetaminophen (TYLENOL) 325 MG tablet, Take 2 tablets (650 mg total) by mouth every 4 (four) hours as needed for mild pain (or temp > 37.5 C (99.5 F))., Disp: , Rfl:    apixaban (ELIQUIS) 5 MG TABS tablet, TAKE 1 TABLET(5 MG) BY MOUTH TWICE DAILY, Disp: 60 tablet, Rfl: 11   atorvastatin (LIPITOR) 80 MG tablet, Take 1  tablet (80 mg total) by mouth at bedtime., Disp: 30 tablet, Rfl: 11   carvedilol (COREG) 25 MG tablet, TAKE 1 TABLET (25 MG TOTAL) BY MOUTH 2 (TWO) TIMES DAILY., Disp: 60 tablet, Rfl: 0   Cholecalciferol (D3-1000 PO), Take 1,000 Units by mouth daily., Disp: , Rfl:    dapagliflozin propanediol (FARXIGA) 10 MG TABS tablet, Take 1 tablet (10 mg total) by  mouth daily before breakfast., Disp: 90 tablet, Rfl: 3   Evolocumab (REPATHA SURECLICK) 174 MG/ML SOAJ, Inject 140 mg into the skin every 14 (fourteen) days., Disp: 2 mL, Rfl: 11   fenofibrate 54 MG tablet, Take 1 tablet (54 mg total) by mouth daily., Disp: 30 tablet, Rfl: 6   furosemide (LASIX) 80 MG tablet, Take 1 tablet (80 mg total) by mouth every morning AND 0.5 tablets (40 mg total) every evening., Disp: 90 tablet, Rfl: 3   hydrALAZINE (APRESOLINE) 50 MG tablet, Take 1 tablet (50 mg total) by mouth 3 (three) times daily., Disp: 260 tablet, Rfl: 3   insulin glargine (LANTUS SOLOSTAR) 100 UNIT/ML Solostar Pen, Inject 10 Units into the skin at bedtime. (Patient taking differently: Inject 35 Units into the skin at bedtime.), Disp: 9 mL, Rfl: 3   Insulin Pen Needle (PEN NEEDLES) 31G X 6 MM MISC, 1 pen by Does not apply route daily., Disp: 100 each, Rfl: 11   isosorbide mononitrate (IMDUR) 60 MG 24 hr tablet, Take 1 tablet (60 mg total) by mouth daily., Disp: 90 tablet, Rfl: 3   latanoprost (XALATAN) 0.005 % ophthalmic solution, Place 1 drop into both eyes at bedtime., Disp: , Rfl:    meclizine (ANTIVERT) 25 MG tablet, Take 25 mg by mouth 3 (three) times daily as needed for dizziness., Disp: , Rfl:    ondansetron (ZOFRAN) 4 MG tablet, Take 1 tablet (4 mg total) by mouth every 8 (eight) hours as needed for nausea or vomiting., Disp: 30 tablet, Rfl: 0   VASCEPA 1 g capsule, Take 2 capsules (2 g total) by mouth 2 (two) times daily., Disp: 120 capsule, Rfl: 5   Continuous Blood Gluc Sensor (FREESTYLE LIBRE 3 SENSOR) MISC, 1 application by Does not apply route every 14 (fourteen) days. Place 1 sensor on the skin every 14 days. Use to check glucose continuously (Patient not taking: Reported on 09/16/2021), Disp: 2 each, Rfl: 5   metolazone (ZAROXOLYN) 2.5 MG tablet, Take 1 tablet (2.5 mg total) by mouth as directed. Per Heart Failure Team instructions. (Patient not taking: Reported on 09/16/2021), Disp: 6 tablet,  Rfl: 3   sertraline (ZOLOFT) 50 MG tablet, Take 1 tablet (50 mg total) by mouth daily., Disp: 90 tablet, Rfl: 1 Allergies  Allergen Reactions   Lisinopril Cough      Social History   Socioeconomic History   Marital status: Legally Separated    Spouse name: Not on file   Number of children: Not on file   Years of education: Not on file   Highest education level: Not on file  Occupational History   Not on file  Tobacco Use   Smoking status: Never   Smokeless tobacco: Never  Vaping Use   Vaping Use: Never used  Substance and Sexual Activity   Alcohol use: Not Currently    Comment: occ   Drug use: No   Sexual activity: Yes    Birth control/protection: None  Other Topics Concern   Not on file  Social History Narrative   Not on file   Social Determinants of Health  Financial Resource Strain: Medium Risk (02/17/2020)   Overall Financial Resource Strain (CARDIA)    Difficulty of Paying Living Expenses: Somewhat hard  Food Insecurity: Not on file  Transportation Needs: Not on file  Physical Activity: Not on file  Stress: Not on file  Social Connections: Not on file  Intimate Partner Violence: Not on file    Physical Exam      Future Appointments  Date Time Provider [place]  09/20/2021  8:35 AM [facility] DEVICE REMOTES [facility]  12/02/2021 11:40 AM Larey Dresser, MD [facility] None  12/20/2021  8:35 AM [facility] DEVICE REMOTES [facility]       [name], [place] [facility] Paramedic  09/16/21

## 2021-09-20 ENCOUNTER — Other Ambulatory Visit (HOSPITAL_COMMUNITY): Payer: Self-pay | Admitting: *Deleted

## 2021-09-20 ENCOUNTER — Ambulatory Visit (INDEPENDENT_AMBULATORY_CARE_PROVIDER_SITE_OTHER): Payer: Medicare Other

## 2021-09-20 DIAGNOSIS — I428 Other cardiomyopathies: Secondary | ICD-10-CM

## 2021-09-20 MED ORDER — FUROSEMIDE 80 MG PO TABS: ORAL_TABLET | ORAL | 3 refills | Status: AC

## 2021-09-21 LAB — CUP PACEART REMOTE DEVICE CHECK
Battery Remaining Longevity: 132 mo
Battery Remaining Percentage: 93 %
Brady Statistic RV Percent Paced: 0 %
Date Time Interrogation Session: 20230612004200
HighPow Impedance: 64 Ohm
Implantable Lead Implant Date: 20180706
Implantable Lead Location: 753860
Implantable Lead Model: 293
Implantable Lead Serial Number: 432676
Implantable Pulse Generator Implant Date: 20180706
Lead Channel Impedance Value: 416 Ohm
Lead Channel Setting Pacing Amplitude: 2.5 V
Lead Channel Setting Pacing Pulse Width: 0.4 ms
Lead Channel Setting Sensing Sensitivity: 0.5 mV
Pulse Gen Serial Number: 236570

## 2021-09-24 DIAGNOSIS — R809 Proteinuria, unspecified: Secondary | ICD-10-CM | POA: Diagnosis not present

## 2021-09-24 DIAGNOSIS — I129 Hypertensive chronic kidney disease with stage 1 through stage 4 chronic kidney disease, or unspecified chronic kidney disease: Secondary | ICD-10-CM | POA: Diagnosis not present

## 2021-09-24 DIAGNOSIS — E1122 Type 2 diabetes mellitus with diabetic chronic kidney disease: Secondary | ICD-10-CM | POA: Diagnosis not present

## 2021-09-24 DIAGNOSIS — E1129 Type 2 diabetes mellitus with other diabetic kidney complication: Secondary | ICD-10-CM | POA: Diagnosis not present

## 2021-09-24 DIAGNOSIS — H9193 Unspecified hearing loss, bilateral: Secondary | ICD-10-CM | POA: Diagnosis not present

## 2021-09-24 DIAGNOSIS — N184 Chronic kidney disease, stage 4 (severe): Secondary | ICD-10-CM | POA: Diagnosis not present

## 2021-09-24 DIAGNOSIS — I5042 Chronic combined systolic (congestive) and diastolic (congestive) heart failure: Secondary | ICD-10-CM | POA: Diagnosis not present

## 2021-09-24 DIAGNOSIS — N2581 Secondary hyperparathyroidism of renal origin: Secondary | ICD-10-CM | POA: Diagnosis not present

## 2021-09-27 ENCOUNTER — Ambulatory Visit: Payer: Medicare Other | Admitting: Nurse Practitioner

## 2021-09-29 ENCOUNTER — Other Ambulatory Visit: Payer: Self-pay | Admitting: Nurse Practitioner

## 2021-10-01 ENCOUNTER — Other Ambulatory Visit (HOSPITAL_COMMUNITY): Payer: Self-pay | Admitting: Cardiology

## 2021-10-07 NOTE — Progress Notes (Signed)
Remote ICD transmission.   

## 2021-10-13 ENCOUNTER — Telehealth (HOSPITAL_COMMUNITY): Payer: Self-pay | Admitting: Licensed Clinical Social Worker

## 2021-10-13 NOTE — Telephone Encounter (Signed)
H&V Care Navigation CSW Progress Note  Clinical Social Worker  contacted pt by email  to provide resources for financial strain.   SDOH Screenings   Alcohol Screen: Not on file  Depression (PHQ2-9): Low Risk  (07/28/2021)   Depression (PHQ2-9)    PHQ-2 Score: 0  Financial Resource Strain: High Risk (10/13/2021)   Overall Financial Resource Strain (CARDIA)    Difficulty of Paying Living Expenses: Hard  Food Insecurity: No Food Insecurity (02/17/2020)   Hunger Vital Sign    Worried About Running Out of Food in the Last Year: Never true    Ran Out of Food in the Last Year: Never true  Housing: Not on file  Physical Activity: Not on file  Social Connections: Not on file  Stress: Not on file  Tobacco Use: Low Risk  (09/07/2021)   Patient History    Smoking Tobacco Use: Never    Smokeless Tobacco Use: Never    Passive Exposure: Not on file  Transportation Needs: No Transportation Needs (02/13/2020)   PRAPARE - Transportation    Lack of Transportation (Medical): No    Lack of Transportation (Non-Medical): No   CSW informed by community paramedic that pt is several months behind on rent after his roommate moved out and pt unable to afford housing expenses alone.  Paramedic believes he is about $2000 overdue and is already involved with the court system over this so sounds as if eviction proceedings have started.  CSW sent patient a list of local resources including information on [employer] crisis assistance and the information for [employer] eviction mediation program.  CSW will continue to follow and assist pt in following up as needed  Jorge Ny, [facility] Desk#: [phone] Cell#: [phone]

## 2021-10-15 ENCOUNTER — Encounter (HOSPITAL_COMMUNITY): Payer: Self-pay | Admitting: Emergency Medicine

## 2021-10-15 ENCOUNTER — Inpatient Hospital Stay (HOSPITAL_COMMUNITY)
Admission: EM | Admit: 2021-10-15 | Discharge: 2021-10-19 | DRG: 438 | Disposition: A | Discharge status: Home / Self Care | Payer: Medicare Other | Attending: Internal Medicine | Admitting: Internal Medicine

## 2021-10-15 ENCOUNTER — Other Ambulatory Visit: Payer: Self-pay

## 2021-10-15 DIAGNOSIS — E785 Hyperlipidemia, unspecified: Secondary | ICD-10-CM | POA: Diagnosis present

## 2021-10-15 DIAGNOSIS — K859 Acute pancreatitis without necrosis or infection, unspecified: Secondary | ICD-10-CM | POA: Diagnosis not present

## 2021-10-15 DIAGNOSIS — Z95810 Presence of automatic (implantable) cardiac defibrillator: Secondary | ICD-10-CM

## 2021-10-15 DIAGNOSIS — N179 Acute kidney failure, unspecified: Secondary | ICD-10-CM | POA: Diagnosis present

## 2021-10-15 DIAGNOSIS — R1084 Generalized abdominal pain: Secondary | ICD-10-CM | POA: Diagnosis not present

## 2021-10-15 DIAGNOSIS — R1111 Vomiting without nausea: Secondary | ICD-10-CM | POA: Diagnosis not present

## 2021-10-15 DIAGNOSIS — H9190 Unspecified hearing loss, unspecified ear: Secondary | ICD-10-CM

## 2021-10-15 DIAGNOSIS — Z794 Long term (current) use of insulin: Secondary | ICD-10-CM

## 2021-10-15 DIAGNOSIS — R109 Unspecified abdominal pain: Secondary | ICD-10-CM | POA: Diagnosis not present

## 2021-10-15 DIAGNOSIS — I10 Essential (primary) hypertension: Secondary | ICD-10-CM | POA: Diagnosis not present

## 2021-10-15 DIAGNOSIS — F1010 Alcohol abuse, uncomplicated: Secondary | ICD-10-CM | POA: Diagnosis present

## 2021-10-15 DIAGNOSIS — K852 Alcohol induced acute pancreatitis without necrosis or infection: Principal | ICD-10-CM | POA: Diagnosis present

## 2021-10-15 DIAGNOSIS — I700 Atherosclerosis of aorta: Secondary | ICD-10-CM | POA: Diagnosis not present

## 2021-10-15 DIAGNOSIS — Z7901 Long term (current) use of anticoagulants: Secondary | ICD-10-CM

## 2021-10-15 DIAGNOSIS — N186 End stage renal disease: Secondary | ICD-10-CM | POA: Diagnosis present

## 2021-10-15 DIAGNOSIS — E559 Vitamin D deficiency, unspecified: Secondary | ICD-10-CM | POA: Diagnosis present

## 2021-10-15 DIAGNOSIS — Z79899 Other long term (current) drug therapy: Secondary | ICD-10-CM

## 2021-10-15 DIAGNOSIS — E781 Pure hyperglyceridemia: Secondary | ICD-10-CM | POA: Diagnosis present

## 2021-10-15 DIAGNOSIS — I428 Other cardiomyopathies: Secondary | ICD-10-CM | POA: Diagnosis present

## 2021-10-15 DIAGNOSIS — H9193 Unspecified hearing loss, bilateral: Secondary | ICD-10-CM | POA: Diagnosis present

## 2021-10-15 DIAGNOSIS — I132 Hypertensive heart and chronic kidney disease with heart failure and with stage 5 chronic kidney disease, or end stage renal disease: Secondary | ICD-10-CM | POA: Diagnosis present

## 2021-10-15 DIAGNOSIS — I480 Paroxysmal atrial fibrillation: Secondary | ICD-10-CM | POA: Diagnosis present

## 2021-10-15 DIAGNOSIS — Z86711 Personal history of pulmonary embolism: Secondary | ICD-10-CM

## 2021-10-15 DIAGNOSIS — R112 Nausea with vomiting, unspecified: Secondary | ICD-10-CM | POA: Diagnosis not present

## 2021-10-15 DIAGNOSIS — Z8249 Family history of ischemic heart disease and other diseases of the circulatory system: Secondary | ICD-10-CM

## 2021-10-15 DIAGNOSIS — G4730 Sleep apnea, unspecified: Secondary | ICD-10-CM | POA: Diagnosis present

## 2021-10-15 DIAGNOSIS — Z8616 Personal history of COVID-19: Secondary | ICD-10-CM

## 2021-10-15 DIAGNOSIS — E1122 Type 2 diabetes mellitus with diabetic chronic kidney disease: Secondary | ICD-10-CM | POA: Diagnosis present

## 2021-10-15 DIAGNOSIS — Z86718 Personal history of other venous thrombosis and embolism: Secondary | ICD-10-CM

## 2021-10-15 DIAGNOSIS — Z888 Allergy status to other drugs, medicaments and biological substances status: Secondary | ICD-10-CM

## 2021-10-15 DIAGNOSIS — N184 Chronic kidney disease, stage 4 (severe): Secondary | ICD-10-CM | POA: Diagnosis present

## 2021-10-15 DIAGNOSIS — I5022 Chronic systolic (congestive) heart failure: Secondary | ICD-10-CM | POA: Diagnosis present

## 2021-10-15 DIAGNOSIS — Y900 Blood alcohol level of less than 20 mg/100 ml: Secondary | ICD-10-CM | POA: Diagnosis present

## 2021-10-15 LAB — CBC
HCT: 38.2 % — ABNORMAL LOW (ref 39.0–52.0)
Hemoglobin: 12.3 g/dL — ABNORMAL LOW (ref 13.0–17.0)
MCH: 28.5 pg (ref 26.0–34.0)
MCHC: 32.2 g/dL (ref 30.0–36.0)
MCV: 88.4 fL (ref 80.0–100.0)
Platelets: 203 10*3/uL (ref 150–400)
RBC: 4.32 MIL/uL (ref 4.22–5.81)
RDW: 12.8 % (ref 11.5–15.5)
WBC: 6.7 10*3/uL (ref 4.0–10.5)
nRBC: 0 % (ref 0.0–0.2)

## 2021-10-15 MED ORDER — DROPERIDOL 2.5 MG/ML IJ SOLN
1.2500 mg | Freq: Once | INTRAMUSCULAR | Status: AC
  Administered 2021-10-16: 1.25 mg via INTRAVENOUS
  Filled 2021-10-15: qty 2

## 2021-10-15 MED ORDER — DIPHENHYDRAMINE HCL 50 MG/ML IJ SOLN
25.0000 mg | Freq: Once | INTRAMUSCULAR | Status: AC
  Administered 2021-10-16: 25 mg via INTRAVENOUS
  Filled 2021-10-15: qty 1

## 2021-10-15 MED ORDER — HYDROMORPHONE HCL 1 MG/ML IJ SOLN
0.5000 mg | Freq: Once | INTRAMUSCULAR | Status: AC
  Administered 2021-10-16: 0.5 mg via INTRAVENOUS
  Filled 2021-10-15: qty 1

## 2021-10-15 MED ORDER — SODIUM CHLORIDE 0.9 % IV BOLUS
250.0000 mL | Freq: Once | INTRAVENOUS | Status: AC
  Administered 2021-10-16: 250 mL via INTRAVENOUS

## 2021-10-15 MED ORDER — SODIUM CHLORIDE 0.9 % IV BOLUS: 1000.0000 mL | Freq: Once | INTRAVENOUS | Status: DC

## 2021-10-15 NOTE — ED Triage Notes (Signed)
Pt BIB [facility] from home, c/o nausea/vomiting/abdominal pain that started tonight. Attempted to use ASL interpreter, pt in too much pain, will not look at screen. Pt clutching his LLQ.

## 2021-10-16 ENCOUNTER — Encounter (HOSPITAL_COMMUNITY): Payer: Self-pay | Admitting: Internal Medicine

## 2021-10-16 ENCOUNTER — Emergency Department (HOSPITAL_COMMUNITY): Payer: Medicare Other

## 2021-10-16 DIAGNOSIS — I480 Paroxysmal atrial fibrillation: Secondary | ICD-10-CM

## 2021-10-16 DIAGNOSIS — H9193 Unspecified hearing loss, bilateral: Secondary | ICD-10-CM

## 2021-10-16 DIAGNOSIS — I5022 Chronic systolic (congestive) heart failure: Secondary | ICD-10-CM

## 2021-10-16 DIAGNOSIS — I10 Essential (primary) hypertension: Secondary | ICD-10-CM

## 2021-10-16 DIAGNOSIS — Z8249 Family history of ischemic heart disease and other diseases of the circulatory system: Secondary | ICD-10-CM | POA: Diagnosis not present

## 2021-10-16 DIAGNOSIS — Z794 Long term (current) use of insulin: Secondary | ICD-10-CM | POA: Diagnosis not present

## 2021-10-16 DIAGNOSIS — K859 Acute pancreatitis without necrosis or infection, unspecified: Secondary | ICD-10-CM

## 2021-10-16 DIAGNOSIS — N184 Chronic kidney disease, stage 4 (severe): Secondary | ICD-10-CM

## 2021-10-16 DIAGNOSIS — F1010 Alcohol abuse, uncomplicated: Secondary | ICD-10-CM

## 2021-10-16 DIAGNOSIS — N186 End stage renal disease: Secondary | ICD-10-CM | POA: Diagnosis present

## 2021-10-16 DIAGNOSIS — R109 Unspecified abdominal pain: Secondary | ICD-10-CM | POA: Diagnosis not present

## 2021-10-16 DIAGNOSIS — Z86718 Personal history of other venous thrombosis and embolism: Secondary | ICD-10-CM | POA: Diagnosis not present

## 2021-10-16 DIAGNOSIS — I700 Atherosclerosis of aorta: Secondary | ICD-10-CM | POA: Diagnosis not present

## 2021-10-16 DIAGNOSIS — Z888 Allergy status to other drugs, medicaments and biological substances status: Secondary | ICD-10-CM | POA: Diagnosis not present

## 2021-10-16 DIAGNOSIS — N179 Acute kidney failure, unspecified: Secondary | ICD-10-CM | POA: Diagnosis present

## 2021-10-16 DIAGNOSIS — G4730 Sleep apnea, unspecified: Secondary | ICD-10-CM | POA: Diagnosis present

## 2021-10-16 DIAGNOSIS — E785 Hyperlipidemia, unspecified: Secondary | ICD-10-CM | POA: Diagnosis present

## 2021-10-16 DIAGNOSIS — Z7901 Long term (current) use of anticoagulants: Secondary | ICD-10-CM | POA: Diagnosis not present

## 2021-10-16 DIAGNOSIS — N183 Chronic kidney disease, stage 3 unspecified: Secondary | ICD-10-CM

## 2021-10-16 DIAGNOSIS — E781 Pure hyperglyceridemia: Secondary | ICD-10-CM | POA: Diagnosis present

## 2021-10-16 DIAGNOSIS — Z79899 Other long term (current) drug therapy: Secondary | ICD-10-CM | POA: Diagnosis not present

## 2021-10-16 DIAGNOSIS — E1122 Type 2 diabetes mellitus with diabetic chronic kidney disease: Secondary | ICD-10-CM | POA: Diagnosis present

## 2021-10-16 DIAGNOSIS — E559 Vitamin D deficiency, unspecified: Secondary | ICD-10-CM | POA: Diagnosis present

## 2021-10-16 DIAGNOSIS — Y900 Blood alcohol level of less than 20 mg/100 ml: Secondary | ICD-10-CM | POA: Diagnosis present

## 2021-10-16 DIAGNOSIS — Z86711 Personal history of pulmonary embolism: Secondary | ICD-10-CM | POA: Diagnosis not present

## 2021-10-16 DIAGNOSIS — I132 Hypertensive heart and chronic kidney disease with heart failure and with stage 5 chronic kidney disease, or end stage renal disease: Secondary | ICD-10-CM | POA: Diagnosis present

## 2021-10-16 DIAGNOSIS — Z95810 Presence of automatic (implantable) cardiac defibrillator: Secondary | ICD-10-CM | POA: Diagnosis not present

## 2021-10-16 DIAGNOSIS — Z8616 Personal history of COVID-19: Secondary | ICD-10-CM | POA: Diagnosis not present

## 2021-10-16 DIAGNOSIS — I428 Other cardiomyopathies: Secondary | ICD-10-CM | POA: Diagnosis present

## 2021-10-16 DIAGNOSIS — K852 Alcohol induced acute pancreatitis without necrosis or infection: Secondary | ICD-10-CM | POA: Diagnosis present

## 2021-10-16 LAB — LIPID PANEL
Cholesterol: 194 mg/dL (ref 0–200)
HDL: 51 mg/dL (ref >40)
LDL Cholesterol: 113 mg/dL — ABNORMAL HIGH (ref 0–99)
Total CHOL/HDL Ratio: 3.8 RATIO
Triglycerides: 150 mg/dL — ABNORMAL HIGH (ref <150)
VLDL: 30 mg/dL (ref 0–40)

## 2021-10-16 LAB — URINALYSIS, ROUTINE W REFLEX MICROSCOPIC
Bilirubin Urine: NEGATIVE
Glucose, UA: >=500 mg/dL — AB
Hgb urine dipstick: NEGATIVE
Ketones, ur: 5 mg/dL — AB
Leukocytes,Ua: NEGATIVE
Nitrite: NEGATIVE
Protein, ur: >=300 mg/dL — AB
Specific Gravity, Urine: 1.014 (ref 1.005–1.030)
pH: 9 — ABNORMAL HIGH (ref 5.0–8.0)

## 2021-10-16 LAB — CBG MONITORING, ED
Glucose-Capillary: 225 mg/dL — ABNORMAL HIGH (ref 70–99)
Glucose-Capillary: 204 mg/dL — ABNORMAL HIGH (ref 70–99)
Glucose-Capillary: 162 mg/dL — ABNORMAL HIGH (ref 70–99)
Glucose-Capillary: 168 mg/dL — ABNORMAL HIGH (ref 70–99)
Glucose-Capillary: 124 mg/dL — ABNORMAL HIGH (ref 70–99)

## 2021-10-16 LAB — COMPREHENSIVE METABOLIC PANEL
ALT: 28 U/L (ref 0–44)
AST: 30 U/L (ref 15–41)
Albumin: 4 g/dL (ref 3.5–5.0)
Alkaline Phosphatase: 50 U/L (ref 38–126)
Anion gap: 14 (ref 5–15)
BUN: 39 mg/dL — ABNORMAL HIGH (ref 6–20)
CO2: 22 mmol/L (ref 22–32)
Calcium: 9.6 mg/dL (ref 8.9–10.3)
Chloride: 102 mmol/L (ref 98–111)
Creatinine, Ser: 2.87 mg/dL — ABNORMAL HIGH (ref 0.61–1.24)
GFR, Estimated: 26 mL/min — ABNORMAL LOW (ref >60)
Glucose, Bld: 197 mg/dL — ABNORMAL HIGH (ref 70–99)
Potassium: 4.7 mmol/L (ref 3.5–5.1)
Sodium: 138 mmol/L (ref 135–145)
Total Bilirubin: 1 mg/dL (ref 0.3–1.2)
Total Protein: 8 g/dL (ref 6.5–8.1)

## 2021-10-16 LAB — I-STAT CHEM 8, ED
BUN: 40 mg/dL — ABNORMAL HIGH (ref 6–20)
Calcium, Ion: 1.12 mmol/L — ABNORMAL LOW (ref 1.15–1.40)
Chloride: 105 mmol/L (ref 98–111)
Creatinine, Ser: 3 mg/dL — ABNORMAL HIGH (ref 0.61–1.24)
Glucose, Bld: 186 mg/dL — ABNORMAL HIGH (ref 70–99)
HCT: 40 % (ref 39.0–52.0)
Hemoglobin: 13.6 g/dL (ref 13.0–17.0)
Potassium: 4.6 mmol/L (ref 3.5–5.1)
Sodium: 139 mmol/L (ref 135–145)
TCO2: 24 mmol/L (ref 22–32)

## 2021-10-16 LAB — GLUCOSE, CAPILLARY
Glucose-Capillary: 154 mg/dL — ABNORMAL HIGH (ref 70–99)
Glucose-Capillary: 102 mg/dL — ABNORMAL HIGH (ref 70–99)

## 2021-10-16 LAB — RAPID URINE DRUG SCREEN, HOSP PERFORMED
Amphetamines: NOT DETECTED
Barbiturates: NOT DETECTED
Benzodiazepines: NOT DETECTED
Cocaine: NOT DETECTED
Opiates: NOT DETECTED
Tetrahydrocannabinol: NOT DETECTED

## 2021-10-16 LAB — LACTIC ACID, PLASMA: Lactic Acid, Venous: 1.1 mmol/L (ref 0.5–1.9)

## 2021-10-16 LAB — ETHANOL: Alcohol, Ethyl (B): <10 mg/dL (ref <10)

## 2021-10-16 LAB — LIPASE, BLOOD: Lipase: 168 U/L — ABNORMAL HIGH (ref 11–51)

## 2021-10-16 MED ORDER — INSULIN GLARGINE-YFGN 100 UNIT/ML ~~LOC~~ SOLN
10.0000 [IU] | Freq: Every day | SUBCUTANEOUS | Status: DC
  Administered 2021-10-16 – 2021-10-18 (×3): 10 [IU] via SUBCUTANEOUS
  Filled 2021-10-16 (×5): qty 0.1

## 2021-10-16 MED ORDER — HYDROMORPHONE HCL 1 MG/ML IJ SOLN
0.5000 mg | INTRAMUSCULAR | Status: DC | PRN
  Administered 2021-10-16 – 2021-10-17 (×3): 1 mg via INTRAVENOUS
  Filled 2021-10-16 (×3): qty 1

## 2021-10-16 MED ORDER — HYDRALAZINE HCL 20 MG/ML IJ SOLN: 10.0000 mg | Freq: Four times a day (QID) | INTRAMUSCULAR | Status: DC | PRN

## 2021-10-16 MED ORDER — ACETAMINOPHEN 650 MG RE SUPP: 650.0000 mg | Freq: Four times a day (QID) | RECTAL | Status: DC | PRN

## 2021-10-16 MED ORDER — ACETAMINOPHEN 325 MG PO TABS
650.0000 mg | ORAL_TABLET | Freq: Four times a day (QID) | ORAL | Status: DC | PRN
  Administered 2021-10-17: 650 mg via ORAL
  Filled 2021-10-16: qty 2

## 2021-10-16 MED ORDER — ONDANSETRON HCL 4 MG PO TABS: 4.0000 mg | ORAL_TABLET | Freq: Four times a day (QID) | ORAL | Status: DC | PRN

## 2021-10-16 MED ORDER — INSULIN ASPART 100 UNIT/ML IJ SOLN
0.0000 [IU] | INTRAMUSCULAR | Status: DC
  Administered 2021-10-16 (×2): 2 [IU] via SUBCUTANEOUS
  Administered 2021-10-16 (×2): 3 [IU] via SUBCUTANEOUS
  Administered 2021-10-16: 2 [IU] via SUBCUTANEOUS
  Administered 2021-10-17: 1 [IU] via SUBCUTANEOUS

## 2021-10-16 MED ORDER — LACTATED RINGERS IV SOLN: INTRAVENOUS | Status: DC

## 2021-10-16 MED ORDER — ATORVASTATIN CALCIUM 80 MG PO TABS
80.0000 mg | ORAL_TABLET | Freq: Every day | ORAL | Status: DC
  Administered 2021-10-16 – 2021-10-18 (×4): 80 mg via ORAL
  Filled 2021-10-16 (×2): qty 1
  Filled 2021-10-16: qty 2
  Filled 2021-10-16: qty 1

## 2021-10-16 MED ORDER — ONDANSETRON HCL 4 MG/2ML IJ SOLN: 4.0000 mg | Freq: Four times a day (QID) | INTRAMUSCULAR | Status: DC | PRN

## 2021-10-16 MED ORDER — THIAMINE HCL 100 MG/ML IJ SOLN
100.0000 mg | Freq: Every day | INTRAMUSCULAR | Status: DC
  Filled 2021-10-16: qty 2

## 2021-10-16 MED ORDER — THIAMINE HCL 100 MG PO TABS
100.0000 mg | ORAL_TABLET | Freq: Every day | ORAL | Status: DC
  Administered 2021-10-16 – 2021-10-19 (×4): 100 mg via ORAL
  Filled 2021-10-16 (×4): qty 1

## 2021-10-16 MED ORDER — CARVEDILOL 25 MG PO TABS
25.0000 mg | ORAL_TABLET | Freq: Two times a day (BID) | ORAL | Status: DC
  Administered 2021-10-16 – 2021-10-19 (×7): 25 mg via ORAL
  Filled 2021-10-16: qty 2
  Filled 2021-10-16 (×6): qty 1

## 2021-10-16 MED ORDER — ADULT MULTIVITAMIN W/MINERALS CH
1.0000 | ORAL_TABLET | Freq: Every day | ORAL | Status: DC
  Administered 2021-10-16 – 2021-10-19 (×4): 1 via ORAL
  Filled 2021-10-16 (×4): qty 1

## 2021-10-16 MED ORDER — FOLIC ACID 1 MG PO TABS
1.0000 mg | ORAL_TABLET | Freq: Every day | ORAL | Status: DC
  Administered 2021-10-16 – 2021-10-19 (×4): 1 mg via ORAL
  Filled 2021-10-16 (×4): qty 1

## 2021-10-16 MED ORDER — APIXABAN 5 MG PO TABS
5.0000 mg | ORAL_TABLET | Freq: Two times a day (BID) | ORAL | Status: DC
  Administered 2021-10-16 – 2021-10-19 (×7): 5 mg via ORAL
  Filled 2021-10-16 (×7): qty 1

## 2021-10-16 MED ORDER — INSULIN GLARGINE-YFGN 100 UNIT/ML ~~LOC~~ SOLN: 10.0000 [IU] | Freq: Every day | SUBCUTANEOUS | Status: DC

## 2021-10-16 MED ORDER — ISOSORBIDE MONONITRATE ER 60 MG PO TB24
60.0000 mg | ORAL_TABLET | Freq: Every day | ORAL | Status: DC
  Administered 2021-10-16 – 2021-10-19 (×4): 60 mg via ORAL
  Filled 2021-10-16 (×2): qty 1
  Filled 2021-10-16: qty 2
  Filled 2021-10-16: qty 1

## 2021-10-16 MED ORDER — PANTOPRAZOLE SODIUM 40 MG PO TBEC
40.0000 mg | DELAYED_RELEASE_TABLET | Freq: Every day | ORAL | Status: DC
  Administered 2021-10-16 – 2021-10-19 (×4): 40 mg via ORAL
  Filled 2021-10-16 (×4): qty 1

## 2021-10-16 MED ORDER — HYDRALAZINE HCL 50 MG PO TABS
50.0000 mg | ORAL_TABLET | Freq: Three times a day (TID) | ORAL | Status: DC
  Administered 2021-10-16 – 2021-10-19 (×10): 50 mg via ORAL
  Filled 2021-10-16 (×8): qty 1
  Filled 2021-10-16: qty 2
  Filled 2021-10-16: qty 1

## 2021-10-16 NOTE — Assessment & Plan Note (Signed)
Cont home BP meds except diuretics, BP running high in ED.

## 2021-10-16 NOTE — Assessment & Plan Note (Signed)
Hold Farxiga Hold home lantus 35u QHS Put on lantus 10u QHS And sensitive SSI Q4H

## 2021-10-16 NOTE — Assessment & Plan Note (Addendum)
Pt with h/o acute pancreatitis recurrently in past it looks like. CT today confirms, and lipase mildly elevated. Looks like he was admitted to our service in Oct 2021 with acute pancreatitis.  Also had PE at that time. Triglycerides elevated in 2021 but doesn't look like anyone connected these to his pancreatitis presentation at that time. 1. Check EtOH level 2. Check lipid pnl for triglycerides 3. Trend LFTs with repeat CMP tomorrow 4. Hold lasix 5. Gentle hydration with IVF 6. NPO 7. Check lactate 8. Dilaudid PRN pain 9. zofran PRN nausea

## 2021-10-16 NOTE — Progress Notes (Signed)
Alcohol up                                                                   PROGRESS NOTE                                                                                                                                                                                                             Patient Demographics:    Troy Rush, is a 51 y.o. male, DOB - 1970/05/20, ZOX:[id number]  Outpatient Primary MD for the patient is Bo Merino I, NP    LOS - 0  Admit date - 10/15/2021    Chief Complaint  Patient presents with   Abdominal Pain       Brief Narrative (HPI from H&P)   51 y.o. male with medical history significant of HFrEF EF 35-40%, NICM, HTN, DVT/PE, PAF, on eliquis, CKD 4, deaf, with history of pancreatitis in the past likely due to alcohol intake.  Presents to the hospital with abdominal pain diagnosed with acute pancreatitis and admitted to the hospital.   Subjective:    Troy Rush today has, No headache, No chest pain, Improved abdominal pain - No Nausea, No new weakness tingling or numbness, no SOB   Assessment  & Plan :    Acute pancreatitis likely due to alcohol abuse.  Counseled to quit alcohol, stable triglycerides, CT evidence of acute pancreatitis along with elevated lipase and epigastric pain, will be treated conservatively with bowel rest, IV fluids and pain control.  Continue outpatient Repatha plus statin for dyslipidemia and hypertriglyceridemia.   Paroxysmal atrial fibrillation ([place]) - S/p ablation 02/2021, [place] vas 2 score of greater than 3.  Continue Eliquis and Coreg combination.  CKD (chronic kidney disease), stage IV (HCC) - Creat of 3 today looks to be about his baseline.  Monitor with IV fluids for hydration.  Chronic systolic CHF EF 81% on recent echocardiogram.  Has AICD - currently appears compensated monitor with ongoing IV fluids for acute pancreatitis, continue Coreg, no ACE/ARB due to underlying CKD 4.  Alcohol abuse - counseled to quit all  alcohol intake, CIWA protocol and monitor   Hyperlipidemia -  On repatha + statin, Continue statin for now.  Deaf - Uses sign language interpreter.  HTN (hypertension)  - continue combination  of Coreg, Imdur and hydralazine.  As needed IV hydralazine added.  Type 2 diabetes mellitus with chronic kidney disease, with long-term current use of insulin ([place]) - Hold Farxiga, cut down Lantus dose as he is n.p.o. continue every 4 sliding scale and monitor  Lab Results  Component Value Date   HGBA1C 6.4 (A) 07/28/2021   HGBA1C 6.4 07/28/2021   HGBA1C 6.4 07/28/2021   HGBA1C 6.4 07/28/2021   CBG (last 3)  Recent Labs    10/16/21 0231 10/16/21 0418 10/16/21 0733  GLUCAP 225* 204* 162*        Condition -  Guarded  Family Communication  :  none present  Code Status :  Full  Consults  :  None  PUD Prophylaxis : PPI   Procedures  :     CT - Findings consistent with acute pancreatitis of the pancreatic tail. No organized fluid collections allowing for absence of contrast 2. Negative for hydronephrosis or ureteral stone      Disposition Plan  :    Status is: Observation  DVT Prophylaxis  :     apixaban (ELIQUIS) tablet 5 mg     Lab Results  Component Value Date   PLT 203 10/15/2021    Diet :  Diet Order             Diet NPO time specified Except for: Sips with Meds  Diet effective now                    Inpatient Medications  Scheduled Meds:  apixaban  5 mg Oral BID   atorvastatin  80 mg Oral QHS   carvedilol  25 mg Oral BID WC   folic acid  1 mg Oral Daily   hydrALAZINE  50 mg Oral TID   insulin aspart  0-9 Units Subcutaneous Q4H   insulin glargine-yfgn  10 Units Subcutaneous QHS   isosorbide mononitrate  60 mg Oral Daily   multivitamin with minerals  1 tablet Oral Daily   thiamine  100 mg Oral Daily   Or   thiamine  100 mg Intravenous Daily   Continuous Infusions:  lactated ringers     PRN Meds:.acetaminophen **OR** acetaminophen,  hydrALAZINE, HYDROmorphone (DILAUDID) injection, ondansetron **OR** ondansetron (ZOFRAN) IV  Antibiotics  :    Anti-infectives (From admission, onward)    None        Time Spent in minutes  30   Lala Lund M.D on 10/16/2021 at 8:20 AM  To page go to [email or web address]   [facility] -  Office  [phone]  See all Orders from today for further details    Objective:   Vitals:   10/16/21 0730 10/16/21 0745 10/16/21 0800 10/16/21 0815  BP: (!) 164/96 (!) 171/92 139/86 (!) 140/94  Pulse: 75 77 80 76  Resp: 14 (!) 25 16 12   Temp:      TempSrc:      SpO2: 100% 100% 98% 98%    Wt Readings from Last 3 Encounters:  09/16/21 107.5 kg  09/07/21 104.3 kg  08/26/21 106.3 kg     Intake/Output Summary (Last 24 hours) at 10/16/2021 0820 Last data filed at 10/16/2021 0124 Gross per 24 hour  Intake 250 ml  Output 900 ml  Net -650 ml     Physical Exam  Awake Alert, No new F.N deficits, Normal affect [place].AT,PERRAL Supple Neck, No JVD,   Symmetrical Chest wall movement, Good air movement bilaterally, CTAB RRR,No Gallops,Rubs or new Murmurs,  +  ve B.Sounds, Abd Soft, No tenderness,   No Cyanosis, Clubbing or edema       Data Review:    CBC Recent Labs  Lab 10/15/21 2339 10/16/21 0040  WBC 6.7  --   HGB 12.3* 13.6  HCT 38.2* 40.0  PLT 203  --   MCV 88.4  --   MCH 28.5  --   MCHC 32.2  --   RDW 12.8  --     Electrolytes Recent Labs  Lab 10/15/21 2339 10/16/21 0040 10/16/21 0225  NA 138 139  --   K 4.7 4.6  --   CL 102 105  --   CO2 22  --   --   GLUCOSE 197* 186*  --   BUN 39* 40*  --   CREATININE 2.87* 3.00*  --   CALCIUM 9.6  --   --   AST 30  --   --   ALT 28  --   --   ALKPHOS 50  --   --   BILITOT 1.0  --   --   ALBUMIN 4.0  --   --   LATICACIDVEN  --   --  1.1    ------------------------------------------------------------------------------------------------------------------ Recent Labs    10/16/21 0225  CHOL 194  HDL 51   LDLCALC 113*  TRIG 150*  CHOLHDL 3.8    Micro Results No results found for this or any previous visit (from the past 240 hour(s)).  Radiology Reports CT ABDOMEN PELVIS WO CONTRAST  Result Date: 10/16/2021 CLINICAL DATA:  Left lower quadrant pain with vomiting EXAM: CT ABDOMEN AND PELVIS WITHOUT CONTRAST TECHNIQUE: Multidetector CT imaging of the abdomen and pelvis was performed following the standard protocol without IV contrast. RADIATION DOSE REDUCTION: This exam was performed according to the departmental dose-optimization program which includes automated exposure control, adjustment of the mA and/or kV according to patient size and/or use of iterative reconstruction technique. COMPARISON:  CT 07/17/2021 FINDINGS: Lower chest: No acute abnormality. Hepatobiliary: No focal liver abnormality is seen. No gallstones, gallbladder wall thickening, or biliary dilatation. Pancreas: Indistinct appearance of the pancreatic tail with mild surrounding stranding. No fluid collections. Spleen: Normal in size without focal abnormality. Adrenals/Urinary Tract: Adrenal glands are unremarkable. Kidneys are normal, without renal calculi, focal lesion, or hydronephrosis. Bladder is unremarkable. Stomach/Bowel: Stomach is within normal limits. Appendix appears normal. No evidence of bowel wall thickening, distention, or inflammatory changes. Vascular/Lymphatic: Mild aortic atherosclerosis. No aneurysm. No suspicious lymph nodes. Reproductive: Prostate is unremarkable. Other: Negative for pelvic effusion or free air. Small fat containing umbilical hernia Musculoskeletal: No acute or significant osseous findings. IMPRESSION: 1. Findings consistent with acute pancreatitis of the pancreatic tail. No organized fluid collections allowing for absence of contrast 2. Negative for hydronephrosis or ureteral stone Electronically Signed   By: Donavan Foil M.D.   On: 10/16/2021 00:44

## 2021-10-16 NOTE — Assessment & Plan Note (Addendum)
Creat of 3 today looks to be about his baseline. Not sure why his PMH section of chart says 'ESRD' he doesn't look to be on dialysis that I can see.  Several day hospital stay on our service in April this year = didn't need dialysis during that. Seems he is being followed by nephrology now per cards office note from May. 1. monitor renal function for now 2. strict intake and output

## 2021-10-16 NOTE — Assessment & Plan Note (Addendum)
Question of EtOH abuse: Patient denies this today in ED to EDP; However, admission H+P and DC summary from his admission in April this year to our service have this documented. Not clear if he drinks heavily or not still. Pt apparently just says he drinks "socially" according to multiple provider notes, cards office note, etc.  Cards actually thinks prior heavy EtOH abuse might be cause of his dilated NICM. Certainly prior EtOH abuse could explain his extensive history of recurrent pancreatitis. 1. Check EtOH level 2. Tele monitor 3. CIWA - will hold off on PRN ativan order though 1. Was on CIWA last admit, but had no symptoms of withdrawal

## 2021-10-16 NOTE — Assessment & Plan Note (Signed)
Uses sign language interpreter.

## 2021-10-16 NOTE — Progress Notes (Signed)
Clinical/Bedside Swallow Evaluation Patient Details  Name: Troy Rush MRN: [id number] Date of Birth: [birth year]  Today's Date: 10/16/2021 Time: SLP Start Time (ACUTE ONLY): 0910 SLP Stop Time (ACUTE ONLY): 5427 SLP Time Calculation (min) (ACUTE ONLY): 18 min  Past Medical History:  Past Medical History:  Diagnosis Date   AICD (automatic cardioverter/defibrillator) present 10/14/2016   Bilateral deafness    Bronchitis    CHF (congestive heart failure) ([place])    COVID-19    Depression    Diabetes mellitus without complication ([place])    DVT, lower extremity ([place]) 07/12/2019   Dyslipidemia    ESRD (end stage renal disease) ([place])    Reports stage IV CKD, Has not started dialysis d/t inability to communicate w/ nephrologist on his first appointment   Headache 08/2019   Hypertension    ICD (implantable cardioverter-defibrillator) in place    Insomnia 08/2019   NICM (nonischemic cardiomyopathy) ([place]) 09/15/2016   Pancreatitis    Renal disorder    Sleep apnea    Vitamin D deficiency 08/2019   Past Surgical History:  Past Surgical History:  Procedure Laterality Date   ATRIAL FIBRILLATION ABLATION N/A 03/02/2021   Procedure: ATRIAL FIBRILLATION ABLATION;  Surgeon: Constance Haw, MD;  Location: [place] [facility];  Service: Cardiovascular;  Laterality: N/A;   BACK SURGERY     CARDIAC CATHETERIZATION     ESOPHAGOGASTRODUODENOSCOPY (EGD) WITH PROPOFOL N/A 02/02/2020   Procedure: ESOPHAGOGASTRODUODENOSCOPY (EGD) WITH PROPOFOL;  Surgeon: Arta Silence, MD;  Location: [facility];  Service: Endoscopy;  Laterality: N/A;   ICD IMPLANT  10/14/2016   ICD IMPLANT N/A 10/14/2016   Procedure: ICD Implant;  Surgeon: Deboraha Sprang, MD;  Location: [place] [facility];  Service: Cardiovascular;  Laterality: N/A;   PRESSURE SENSOR/CARDIOMEMS N/A 09/10/2020   Procedure: PRESSURE SENSOR/CARDIOMEMS;  Surgeon: Larey Dresser, MD;  Location: [place] [facility];  Service: Cardiovascular;   Laterality: N/A;   RIGHT HEART CATH N/A 05/21/2020   Procedure: RIGHT HEART CATH;  Surgeon: Larey Dresser, MD;  Location: [place] [facility];  Service: Cardiovascular;  Laterality: N/A;   WRIST SURGERY     HPI:  pt is a 51 yo male adm [facility] ED with abdomen pain and vomiting- found to have likely acute pancreatitis due to ETOH use. Swallow evaluation ordered.    Assessment / Plan / Recommendation  Clinical Impression  Pt with functional oropharyngeal swallow ability based on clinical swallow evaluation. No focal CN deficits apparent.  Pt seen with only few sips of water and single bite of applesauce and cracker.  Pt denies changes with swallowing nor dysphagia PTA. Did not conduct 3 ounce [place] water screen due to pancreatitis. Swallow was timely without any indication of retention- oral or pharyngeal. Pt has undergone prior endoscopy by GI.   SLP Visit Diagnosis: Dysphagia, unspecified (R13.10)    Aspiration Risk  No limitations    Diet Recommendation Regular;Thin liquid   Liquid Administration via: Cup;Straw Medication Administration: Whole meds with liquid Supervision: Patient able to self feed Postural Changes: Remain upright for at least 30 minutes after po intake;Seated upright at 90 degrees    Other  Recommendations   N/a   Recommendations for follow up therapy are one component of a multi-disciplinary discharge planning process, led by the attending physician.  Recommendations may be updated based on patient status, additional functional criteria and insurance authorization.  Follow up Recommendations   none     Assistance Recommended at Discharge None  Functional Status Assessment Patient has not had a recent decline in their functional status  Frequency and Duration   N/a         Prognosis   N/a     Swallow Study   General Date of Onset: 10/16/21 HPI: pt is a 51 yo male adm [facility] ED with abdomen pain and vomiting- found to have likely acute pancreatitis due to  ETOH use. Swallow evaluation ordered. Type of Study: Bedside Swallow Evaluation Previous Swallow Assessment: none Diet Prior to this Study: NPO Temperature Spikes Noted: No Respiratory Status: Room air History of Recent Intubation: No Behavior/Cognition: Alert;Cooperative;Pleasant mood Oral Cavity Assessment: Within Functional Limits Oral Care Completed by SLP: No Oral Cavity - Dentition: Adequate natural dentition Vision: Functional for self-feeding Self-Feeding Abilities: Able to feed self Patient Positioning: Upright in bed Baseline Vocal Quality: Normal Volitional Cough: Strong Volitional Swallow: Able to elicit    Oral/Motor/Sensory Function Overall Oral Motor/Sensory Function: Within functional limits   Ice Chips Ice chips: Within functional limits Presentation: Spoon   Thin Liquid Thin Liquid: Within functional limits Presentation: Self Fed;Spoon;Straw    Nectar Thick Nectar Thick Liquid: Not tested   Honey Thick Honey Thick Liquid: Not tested   Puree Puree: Within functional limits Presentation: Self Fed;Spoon   Solid     Solid: Within functional limits Presentation: Troy Rush, Troy Rush Ann 10/16/2021,9:37 AM  Troy Lime, MS [place] Office [phone] Pager [phone]

## 2021-10-16 NOTE — Assessment & Plan Note (Addendum)
EF 35-40% echo done in May. S/p AICD 1. Cont home BP meds 2. Hold lasix in setting of acute pancreatitis.

## 2021-10-16 NOTE — ED Provider Notes (Signed)
[facility] EMERGENCY DEPARTMENT Provider Note   CSN: [id number] Arrival date & time: 10/15/21  2317     History {Add pertinent medical, surgical, social history, OB history to HPI:1} Chief Complaint  Patient presents with   Abdominal Pain    Troy Rush is a 51 y.o. male.  51 yo M with a chief complaints of abdominal pain nausea and vomiting.  Patient is sign language, refusing to interact with the interpreter.  Level 5 caveat.   Abdominal Pain      Home Medications Prior to Admission medications   Medication Sig Start Date End Date Taking? Authorizing Provider  acetaminophen (TYLENOL) 325 MG tablet Take 2 tablets (650 mg total) by mouth every 4 (four) hours as needed for mild pain (or temp > 37.5 C (99.5 F)). 10/07/20   Angiulli, Lavon Paganini, PA-C  apixaban (ELIQUIS) 5 MG TABS tablet TAKE 1 TABLET(5 MG) BY MOUTH TWICE DAILY 10/07/20   Angiulli, Lavon Paganini, PA-C  atorvastatin (LIPITOR) 80 MG tablet Take 1 tablet (80 mg total) by mouth at bedtime. 10/07/20   Angiulli, Lavon Paganini, PA-C  carvedilol (COREG) 25 MG tablet TAKE 1 TABLET (25 MG TOTAL) BY MOUTH 2 (TWO) TIMES DAILY. 01/04/21 01/04/22  Consuelo Pandy, PA-C  Cholecalciferol (D3-1000 PO) Take 1,000 Units by mouth daily.    [provider]  Continuous Blood Gluc Sensor (FREESTYLE LIBRE 3 SENSOR) MISC 1 application by Does not apply route every 14 (fourteen) days. Place 1 sensor on the skin every 14 days. Use to check glucose continuously Patient not taking: Reported on 09/16/2021 04/19/21   Vevelyn Francois, NP  dapagliflozin propanediol (FARXIGA) 10 MG TABS tablet Take 1 tablet (10 mg total) by mouth daily before breakfast. 10/27/20   Lyda Jester M, PA-C  Evolocumab (REPATHA SURECLICK) 253 MG/ML SOAJ Inject 140 mg into the skin every 14 (fourteen) days. 12/29/20   Larey Dresser, MD  fenofibrate 54 MG tablet Take 1 tablet (54 mg total) by mouth daily. 07/07/21   Larey Dresser, MD  furosemide (LASIX)  80 MG tablet Take 1 tablet (80 mg total) by mouth every morning AND 0.5 tablets (40 mg total) every evening. 09/20/21   Joette Catching, PA-C  hydrALAZINE (APRESOLINE) 50 MG tablet Take 1 tablet (50 mg total) by mouth 3 (three) times daily. 08/05/21   Larey Dresser, MD  insulin glargine (LANTUS SOLOSTAR) 100 UNIT/ML Solostar Pen Inject 10 Units into the skin at bedtime. Patient taking differently: Inject 35 Units into the skin at bedtime. 07/22/21 07/22/22  Ghimire, Henreitta Leber, MD  Insulin Pen Needle (PEN NEEDLES) 31G X 6 MM MISC 1 pen by Does not apply route daily. 01/15/21 01/15/22  Vevelyn Francois, NP  isosorbide mononitrate (IMDUR) 60 MG 24 hr tablet Take 1 tablet (60 mg total) by mouth daily. 08/05/21   Larey Dresser, MD  latanoprost (XALATAN) 0.005 % ophthalmic solution Place 1 drop into both eyes at bedtime. 01/20/20   [provider]  meclizine (ANTIVERT) 25 MG tablet Take 25 mg by mouth 3 (three) times daily as needed for dizziness.    [provider]  metolazone (ZAROXOLYN) 2.5 MG tablet Take 1 tablet (2.5 mg total) by mouth as directed. Per Heart Failure Team instructions. Patient not taking: Reported on 09/16/2021 02/17/21 05/25/22  Darrick Grinder D, NP  ondansetron (ZOFRAN) 4 MG tablet Take 1 tablet (4 mg total) by mouth every 8 (eight) hours as needed for nausea or vomiting. 08/19/21  08/19/22  Caren Griffins, MD  sertraline (ZOLOFT) 50 MG tablet Take 1 tablet (50 mg total) by mouth daily. 03/18/21 09/14/21  Vevelyn Francois, NP  VASCEPA 1 g capsule Take 2 capsules (2 g total) by mouth 2 (two) times daily. 10/07/20   Angiulli, Lavon Paganini, PA-C      Allergies    Lisinopril    Review of Systems   Review of Systems  Gastrointestinal:  Positive for abdominal pain.    Physical Exam Updated Vital Signs BP (!) 164/137   Pulse 81   Temp 98.9 F (37.2 C) (Oral)   Resp (!) 22   SpO2 100%  Physical Exam Vitals and nursing note reviewed.  Constitutional:      Appearance: He  is well-developed.     Comments: Moving all over the bed.  HENT:     Head: Normocephalic and atraumatic.  Eyes:     Pupils: Pupils are equal, round, and reactive to light.  Neck:     Vascular: No JVD.  Cardiovascular:     Rate and Rhythm: Normal rate and regular rhythm.     Heart sounds: No murmur heard.    No friction rub. No gallop.  Pulmonary:     Effort: No respiratory distress.     Breath sounds: No wheezing.  Abdominal:     General: There is no distension.     Tenderness: There is abdominal tenderness. There is no guarding or rebound.     Comments: No obvious significant tenderness on abdominal exam but the patient points to the left lower quadrant as area of pain.  Musculoskeletal:        General: Normal range of motion.     Cervical back: Normal range of motion and neck supple.  Skin:    Coloration: Skin is not pale.     Findings: No rash.  Neurological:     Mental Status: He is alert and oriented to person, place, and time.  Psychiatric:        Behavior: Behavior normal.     ED Results / Procedures / Treatments   Labs (all labs ordered are listed, but only abnormal results are displayed) Labs Reviewed  CBC - Abnormal; Notable for the following components:      Result Value   Hemoglobin 12.3 (*)    HCT 38.2 (*)    All other components within normal limits  LIPASE, BLOOD  COMPREHENSIVE METABOLIC PANEL  URINALYSIS, ROUTINE W REFLEX MICROSCOPIC  I-STAT CHEM 8, ED    EKG None  Radiology No results found.  Procedures Procedures  {Document cardiac monitor, telemetry assessment procedure when appropriate:1}  Medications Ordered in ED Medications  droperidol (INAPSINE) 2.5 MG/ML injection 1.25 mg (has no administration in time range)  diphenhydrAMINE (BENADRYL) injection 25 mg (has no administration in time range)  HYDROmorphone (DILAUDID) injection 0.5 mg (has no administration in time range)  sodium chloride 0.9 % bolus 250 mL (has no administration in  time range)    ED Course/ Medical Decision Making/ A&P                           Medical Decision Making Amount and/or Complexity of Data Reviewed Labs: ordered. Radiology: ordered.  Risk Prescription drug management.   50 yo M with a chief complaints of abdominal pain nausea and vomiting.  This information was provided by EMS.  The patient is a sign language dependent for interaction and is unwilling to  participate with a sign language interpreter.  He is point to the left lower quadrant and so I must suspect that he is having left lower quadrant abdominal discomfort.  We will treat pain and nausea obtain blood work CT scan and reassess.  {Document critical care time when appropriate:1} {Document review of labs and clinical decision tools ie heart score, Chads2Vasc2 etc:1}  {Document your independent review of radiology images, and any outside records:1} {Document your discussion with family members, caretakers, and with consultants:1} {Document social determinants of health affecting pt's care:1} {Document your decision making why or why not admission, treatments were needed:1} Final Clinical Impression(s) / ED Diagnoses Final diagnoses:  None    Rx / DC Orders ED Discharge Orders     None

## 2021-10-16 NOTE — Evaluation (Signed)
Occupational Therapy Evaluation Patient Details Name: Troy Rush MRN: [id number] DOB: 12/19/1970 Today's Date: 10/16/2021   History of Present Illness 51 y.o. male presents to the ED7/7/23 with severe abd pain. +acute pancreatitis; PMH significant of HFrEF EF 35-40%, NICM, HTN, DVT/PE, PAF, on eliquis, CKD 4, recurrent pancreatitis.   Clinical Impression   PT admitted for concerns listed above. 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 At this time, pt appears at his baseline with all functional mobility and ADL's. He has no further skilled OT needs and acute OT will sign off.       Recommendations for follow up therapy are one component of a multi-disciplinary discharge planning process, led by the attending physician.  Recommendations may be updated based on patient status, additional functional criteria and insurance authorization.   Follow Up Recommendations  No OT follow up    Assistance Recommended at Discharge PRN  Patient can return home with the following      Functional Status Assessment  Patient has had a recent decline in their functional status and demonstrates the ability to make significant improvements in function in a reasonable and predictable amount of time.  Equipment Recommendations  None recommended by OT    Recommendations for Other Services       Precautions / Restrictions Precautions Precautions: None Restrictions Weight Bearing Restrictions: No      Mobility Bed Mobility Overal bed mobility: Independent                  Transfers Overall transfer level: Independent Equipment used: None                      Balance Overall balance assessment: Independent                                         ADL either performed or assessed with clinical judgement   ADL Overall ADL's : Independent;At baseline                                        General ADL Comments: No assist needed, pt at his baseline     Vision Baseline Vision/History: 0 No visual deficits Ability to See in Adequate Light: 0 Adequate Patient Visual Report: No change from baseline Vision Assessment?: No apparent visual deficits     Perception     Praxis      Pertinent Vitals/Pain Pain Assessment Pain Assessment: No/denies pain     Hand Dominance Right   Extremity/Trunk Assessment Upper Extremity Assessment Upper Extremity Assessment: Overall WFL for tasks assessed   Lower Extremity Assessment Lower Extremity Assessment: Overall WFL for tasks assessed   Cervical / Trunk Assessment Cervical / Trunk Assessment: Normal   Communication Communication Communication: Deaf;Prefers language other than English   Cognition Arousal/Alertness: Awake/alert Behavior During Therapy: WFL for tasks assessed/performed Overall Cognitive Status: Within Functional Limits for tasks assessed                                       General Comments  VSS on RA    Exercises     Shoulder Instructions  Home Living Family/patient expects to be discharged to:: Private residence Living Arrangements: Non-relatives/Friends Available Help at Discharge: Friend(s);Available 24 hours/day Type of Home: Apartment Home Access: Stairs to enter Entrance Stairs-Number of Steps: 2 Entrance Stairs-Rails: Can reach both Home Layout: One level     Bathroom Shower/Tub: Teacher, early years/pre: Standard     Home Equipment: Shower seat          Prior Functioning/Environment Prior Level of Function : Independent/Modified Independent;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; uses bus)               ADLs Comments: friend usually takes him to grocery store        OT Problem List: Decreased strength;Decreased activity tolerance;Impaired balance (sitting and/or standing)      OT Treatment/Interventions:      OT Goals(Current goals can be  found in the care plan section) Acute Rehab OT Goals Patient Stated Goal: To go home OT Goal Formulation: With patient Time For Goal Achievement: 10/16/21 Potential to Achieve Goals: Good  OT Frequency:      Co-evaluation PT/OT/SLP Co-Evaluation/Treatment: Yes Reason for Co-Treatment: Other (comment) (availability of interpreter) PT goals addressed during session: Mobility/safety with mobility;Balance        AM-PAC OT "6 Clicks" Daily Activity     Outcome Measure Help from another person eating meals?: None Help from another person taking care of personal grooming?: None Help from another person toileting, which includes using toliet, bedpan, or urinal?: None Help from another person bathing (including washing, rinsing, drying)?: None Help from another person to put on and taking off regular upper body clothing?: None Help from another person to put on and taking off regular lower body clothing?: None 6 Click Score: 24   End of Session Nurse Communication: Mobility status  Activity Tolerance: Patient tolerated treatment well Patient left: in bed  OT Visit Diagnosis: Unsteadiness on feet (R26.81);Muscle weakness (generalized) (M62.81)                Time: 0165-5374 OT Time Calculation (min): 20 min Charges:  OT General Charges $OT Visit: 1 Visit OT Evaluation $OT Eval Moderate Complexity: 1 Mod  Marsha Gundlach H., OTR/L Acute Rehabilitation  Brandye Inthavong Elane Steffon Gladu 10/16/2021, 1:09 PM

## 2021-10-16 NOTE — Assessment & Plan Note (Addendum)
S/p ablation 02/2021 Cont eliquis (for this and / or h/o PE). Cont coreg Tele monitor

## 2021-10-16 NOTE — ED Notes (Signed)
ED TO INPATIENT HANDOFF REPORT  ED Nurse Name and Phone #: Clearence Vitug RN [phone]  S Name/Age/Gender Troy Rush 51 y.o. male Room/Bed: 001C/001C  Code Status   Code Status: Full Code  Home/SNF/Other Home Patient oriented to: self, place, time, and situation Is this baseline? Yes   Triage Complete: Triage complete  Chief Complaint Acute pancreatitis [K85.90]  Triage Note Pt BIB [facility] from home, c/o nausea/vomiting/abdominal pain that started tonight. Attempted to use ASL interpreter, pt in too much pain, will not look at screen. Pt clutching his LLQ.   Allergies Allergies  Allergen Reactions   Lisinopril Cough    Level of Care/Admitting Diagnosis ED Disposition     ED Disposition  Admit   Condition  --   Comment  Hospital Area: [facility] [[id number]]  Level of Care: Telemetry Medical [104]  May admit patient to [facility] or [facility] if equivalent level of care is available:: No  Covid Evaluation: Asymptomatic - no recent exposure (last 10 days) testing not required  Diagnosis: Acute pancreatitis [577.0.ICD-9-CM]  Admitting Physician: Etta Quill [[id number]]  Attending Physician: Thurnell Lose [[id number]]  Certification:: I certify this patient will need inpatient services for at least 2 midnights  Estimated Length of Stay: 2          B Medical/Surgery History Past Medical History:  Diagnosis Date   AICD (automatic cardioverter/defibrillator) present 10/14/2016   Bilateral deafness    Bronchitis    CHF (congestive heart failure) ([place])    COVID-19    Depression    Diabetes mellitus without complication ([place])    DVT, lower extremity ([place]) 07/12/2019   Dyslipidemia    ESRD (end stage renal disease) ([place])    Reports stage IV CKD, Has not started dialysis d/t inability to communicate w/ nephrologist on his first appointment   Headache 08/2019   Hypertension    ICD (implantable cardioverter-defibrillator) in place    Insomnia 08/2019    NICM (nonischemic cardiomyopathy) ([place]) 09/15/2016   Pancreatitis    Renal disorder    Sleep apnea    Vitamin D deficiency 08/2019   Past Surgical History:  Procedure Laterality Date   ATRIAL FIBRILLATION ABLATION N/A 03/02/2021   Procedure: ATRIAL FIBRILLATION ABLATION;  Surgeon: Constance Haw, MD;  Location: [facility];  Service: Cardiovascular;  Laterality: N/A;   BACK SURGERY     CARDIAC CATHETERIZATION     ESOPHAGOGASTRODUODENOSCOPY (EGD) WITH PROPOFOL N/A 02/02/2020   Procedure: ESOPHAGOGASTRODUODENOSCOPY (EGD) WITH PROPOFOL;  Surgeon: Arta Silence, MD;  Location: [facility];  Service: Endoscopy;  Laterality: N/A;   ICD IMPLANT  10/14/2016   ICD IMPLANT N/A 10/14/2016   Procedure: ICD Implant;  Surgeon: Deboraha Sprang, MD;  Location: [facility];  Service: Cardiovascular;  Laterality: N/A;   PRESSURE SENSOR/CARDIOMEMS N/A 09/10/2020   Procedure: PRESSURE SENSOR/CARDIOMEMS;  Surgeon: Larey Dresser, MD;  Location: [facility];  Service: Cardiovascular;  Laterality: N/A;   RIGHT HEART CATH N/A 05/21/2020   Procedure: RIGHT HEART CATH;  Surgeon: Larey Dresser, MD;  Location: [facility];  Service: Cardiovascular;  Laterality: N/A;   WRIST SURGERY       A IV Location/Drains/Wounds Patient Lines/Drains/Airways Status     Active Line/Drains/Airways     Name Placement date Placement time Site Days   Peripheral IV 08/18/21 20 G Left Antecubital 08/18/21  1428  Antecubital  59   Incision (Closed) 10/14/16 Chest Left;Upper 10/14/16  1406  --  1828            Intake/Output Last 24 hours  Intake/Output Summary (Last 24 hours) at 10/16/2021 1447 Last data filed at 10/16/2021 0124 Gross per 24 hour  Intake 250 ml  Output 900 ml  Net -650 ml    Labs/Imaging Results for orders placed or performed during the hospital encounter of 10/15/21 (from the past 48 hour(s))  Lipase, blood     Status: Abnormal   Collection Time: 10/15/21 11:39 PM   Result Value Ref Range   Lipase 168 (H) 11 - 51 U/L    Comment: Performed at [facility], [place] [address], [place], [place] [address]  Comprehensive metabolic panel     Status: Abnormal   Collection Time: 10/15/21 11:39 PM  Result Value Ref Range   Sodium 138 135 - 145 mmol/L   Potassium 4.7 3.5 - 5.1 mmol/L   Chloride 102 98 - 111 mmol/L   CO2 22 22 - 32 mmol/L   Glucose, Bld 197 (H) 70 - 99 mg/dL    Comment: Glucose reference range applies only to samples taken after fasting for at least 8 hours.   BUN 39 (H) 6 - 20 mg/dL   Creatinine, Ser 2.87 (H) 0.61 - 1.24 mg/dL   Calcium 9.6 8.9 - 10.3 mg/dL   Total Protein 8.0 6.5 - 8.1 g/dL   Albumin 4.0 3.5 - 5.0 g/dL   AST 30 15 - 41 U/L   ALT 28 0 - 44 U/L   Alkaline Phosphatase 50 38 - 126 U/L   Total Bilirubin 1.0 0.3 - 1.2 mg/dL   GFR, Estimated 26 (L) >60 mL/min    Comment: (NOTE) Calculated using the CKD-EPI Creatinine Equation (2021)    Anion gap 14 5 - 15    Comment: Performed at [facility] [address], [place], [place] [address]  CBC     Status: Abnormal   Collection Time: 10/15/21 11:39 PM  Result Value Ref Range   WBC 6.7 4.0 - 10.5 K/uL   RBC 4.32 4.22 - 5.81 MIL/uL   Hemoglobin 12.3 (L) 13.0 - 17.0 g/dL   HCT 38.2 (L) 39.0 - 52.0 %   MCV 88.4 80.0 - 100.0 fL   MCH 28.5 26.0 - 34.0 pg   MCHC 32.2 30.0 - 36.0 g/dL   RDW 12.8 11.5 - 15.5 %   Platelets 203 150 - 400 K/uL   nRBC 0.0 0.0 - 0.2 %    Comment: Performed at [facility], [place] [address] [place], [place] [address]  I-stat chem 8, ED (not at [facility] or [facility])     Status: Abnormal   Collection Time: 10/16/21 12:40 AM  Result Value Ref Range   Sodium 139 135 - 145 mmol/L   Potassium 4.6 3.5 - 5.1 mmol/L   Chloride 105 98 - 111 mmol/L   BUN 40 (H) 6 - 20 mg/dL   Creatinine, Ser 3.00 (H) 0.61 - 1.24 mg/dL   Glucose, Bld 186 (H) 70 - 99 mg/dL    Comment: Glucose reference range applies only to samples taken after fasting for at least 8  hours.   Calcium, Ion 1.12 (L) 1.15 - 1.40 mmol/L   TCO2 24 22 - 32 mmol/L   Hemoglobin 13.6 13.0 - 17.0 g/dL   HCT 40.0 39.0 - 52.0 %  Lactic acid, plasma     Status: None   Collection Time: 10/16/21  2:25 AM  Result Value Ref Range  Lactic Acid, Venous 1.1 0.5 - 1.9 mmol/L    Comment: Performed at [facility], [place] [address] [place], [place] [address]  Lipid panel     Status: Abnormal   Collection Time: 10/16/21  2:25 AM  Result Value Ref Range   Cholesterol 194 0 - 200 mg/dL   Triglycerides 150 (H) <150 mg/dL   HDL 51 >40 mg/dL   Total CHOL/HDL Ratio 3.8 RATIO   VLDL 30 0 - 40 mg/dL   LDL Cholesterol 113 (H) 0 - 99 mg/dL    Comment:        Total Cholesterol/HDL:CHD Risk Coronary Heart Disease Risk Table                     Men   Women  1/2 Average Risk   3.4   3.3  Average Risk       5.0   4.4  2 X Average Risk   9.6   7.1  3 X Average Risk  23.4   11.0        Use the calculated Patient Ratio above and the CHD Risk Table to determine the patient's CHD Risk.        ATP III CLASSIFICATION (LDL):  <100     mg/dL   Optimal  100-129  mg/dL   Near or Above                    Optimal  130-159  mg/dL   Borderline  160-189  mg/dL   High  >190     mg/dL   Very High Performed at [facility] [address], [place], [place] [address]   Ethanol     Status: None   Collection Time: 10/16/21  2:25 AM  Result Value Ref Range   Alcohol, Ethyl (B) <10 <10 mg/dL    Comment: (NOTE) Lowest detectable limit for serum alcohol is 10 mg/dL.  For medical purposes only. Performed at [facility], [place] [address], [place], [place] [address]   CBG monitoring, ED     Status: Abnormal   Collection Time: 10/16/21  2:31 AM  Result Value Ref Range   Glucose-Capillary 225 (H) 70 - 99 mg/dL    Comment: Glucose reference range applies only to samples taken after fasting for at least 8 hours.  CBG monitoring, ED     Status: Abnormal   Collection Time: 10/16/21  4:18 AM   Result Value Ref Range   Glucose-Capillary 204 (H) 70 - 99 mg/dL    Comment: Glucose reference range applies only to samples taken after fasting for at least 8 hours.  Urinalysis, Routine w reflex microscopic     Status: Abnormal   Collection Time: 10/16/21  6:05 AM  Result Value Ref Range   Color, Urine YELLOW YELLOW   APPearance CLEAR CLEAR   Specific Gravity, Urine 1.014 1.005 - 1.030   pH 9.0 (H) 5.0 - 8.0   Glucose, UA >=500 (A) NEGATIVE mg/dL   Hgb urine dipstick NEGATIVE NEGATIVE   Bilirubin Urine NEGATIVE NEGATIVE   Ketones, ur 5 (A) NEGATIVE mg/dL   Protein, ur >=300 (A) NEGATIVE mg/dL   Nitrite NEGATIVE NEGATIVE   Leukocytes,Ua NEGATIVE NEGATIVE   RBC / HPF 0-5 0 - 5 RBC/hpf   WBC, UA 0-5 0 - 5 WBC/hpf   Bacteria, UA RARE (A) NONE SEEN   Squamous Epithelial / LPF 0-5 0 - 5   Sperm, UA PRESENT  Comment: Performed at [facility], [place] [address] [place], [place] [address]  Rapid urine drug screen (hospital performed)     Status: None   Collection Time: 10/16/21  6:05 AM  Result Value Ref Range   Opiates NONE DETECTED NONE DETECTED   Cocaine NONE DETECTED NONE DETECTED   Benzodiazepines NONE DETECTED NONE DETECTED   Amphetamines NONE DETECTED NONE DETECTED   Tetrahydrocannabinol NONE DETECTED NONE DETECTED   Barbiturates NONE DETECTED NONE DETECTED    Comment: (NOTE) DRUG SCREEN FOR MEDICAL PURPOSES ONLY.  IF CONFIRMATION IS NEEDED FOR ANY PURPOSE, NOTIFY LAB WITHIN 5 DAYS.  LOWEST DETECTABLE LIMITS FOR URINE DRUG SCREEN Drug Class                     Cutoff (ng/mL) Amphetamine and metabolites    1000 Barbiturate and metabolites    200 Benzodiazepine                 485 Tricyclics and metabolites     300 Opiates and metabolites        300 Cocaine and metabolites        300 THC                            50 Performed at [facility], [place] [address], [place], [place] [address]   CBG monitoring, ED     Status: Abnormal   Collection Time:  10/16/21  7:33 AM  Result Value Ref Range   Glucose-Capillary 162 (H) 70 - 99 mg/dL    Comment: Glucose reference range applies only to samples taken after fasting for at least 8 hours.  CBG monitoring, ED     Status: Abnormal   Collection Time: 10/16/21 12:51 PM  Result Value Ref Range   Glucose-Capillary 168 (H) 70 - 99 mg/dL    Comment: Glucose reference range applies only to samples taken after fasting for at least 8 hours.   CT ABDOMEN PELVIS WO CONTRAST  Result Date: 10/16/2021 CLINICAL DATA:  Left lower quadrant pain with vomiting EXAM: CT ABDOMEN AND PELVIS WITHOUT CONTRAST TECHNIQUE: Multidetector CT imaging of the abdomen and pelvis was performed following the standard protocol without IV contrast. RADIATION DOSE REDUCTION: This exam was performed according to the departmental dose-optimization program which includes automated exposure control, adjustment of the mA and/or kV according to patient size and/or use of iterative reconstruction technique. COMPARISON:  CT 07/17/2021 FINDINGS: Lower chest: No acute abnormality. Hepatobiliary: No focal liver abnormality is seen. No gallstones, gallbladder wall thickening, or biliary dilatation. Pancreas: Indistinct appearance of the pancreatic tail with mild surrounding stranding. No fluid collections. Spleen: Normal in size without focal abnormality. Adrenals/Urinary Tract: Adrenal glands are unremarkable. Kidneys are normal, without renal calculi, focal lesion, or hydronephrosis. Bladder is unremarkable. Stomach/Bowel: Stomach is within normal limits. Appendix appears normal. No evidence of bowel wall thickening, distention, or inflammatory changes. Vascular/Lymphatic: Mild aortic atherosclerosis. No aneurysm. No suspicious lymph nodes. Reproductive: Prostate is unremarkable. Other: Negative for pelvic effusion or free air. Small fat containing umbilical hernia Musculoskeletal: No acute or significant osseous findings. IMPRESSION: 1. Findings  consistent with acute pancreatitis of the pancreatic tail. No organized fluid collections allowing for absence of contrast 2. Negative for hydronephrosis or ureteral stone Electronically Signed   By: Donavan Foil M.D.   On: 10/16/2021 00:44    Pending Labs Unresulted Labs (From admission, onward)     Start  Ordered   10/17/21 0500  Magnesium  Daily,   R     Question:  Specimen collection method  Answer:  Lab=Lab collect   10/16/21 0558   10/17/21 0500  Comprehensive metabolic panel  Daily,   R     Question:  Specimen collection method  Answer:  Lab=Lab collect   10/16/21 0558   10/17/21 0500  CBC with Differential/Platelet  Daily,   R     Question:  Specimen collection method  Answer:  Lab=Lab collect   10/16/21 0558   10/17/21 0500  Brain natriuretic peptide  Daily,   R     Question:  Specimen collection method  Answer:  Lab=Lab collect   10/16/21 0558            Vitals/Pain Today's Vitals   10/16/21 0945 10/16/21 1000 10/16/21 1015 10/16/21 1300  BP: (!) 163/90 131/86 109/71 122/75  Pulse: 73 73 72 71  Resp: 14 12 14 15   Temp:      TempSrc:      SpO2: 100% 99% 96% 96%  PainSc:        Isolation Precautions No active isolations  Medications Medications  HYDROmorphone (DILAUDID) injection 0.5-1 mg (1 mg Intravenous Given 10/16/21 1426)  carvedilol (COREG) tablet 25 mg (25 mg Oral Given 10/16/21 0828)  atorvastatin (LIPITOR) tablet 80 mg (80 mg Oral Given 10/16/21 0235)  hydrALAZINE (APRESOLINE) tablet 50 mg (50 mg Oral Given 10/16/21 0937)  isosorbide mononitrate (IMDUR) 24 hr tablet 60 mg (60 mg Oral Given 10/16/21 0937)  apixaban (ELIQUIS) tablet 5 mg (5 mg Oral Given 10/16/21 0937)  insulin aspart (novoLOG) injection 0-9 Units (2 Units Subcutaneous Given 10/16/21 1324)  acetaminophen (TYLENOL) tablet 650 mg (has no administration in time range)    Or  acetaminophen (TYLENOL) suppository 650 mg (has no administration in time range)  ondansetron (ZOFRAN) tablet 4 mg (has no  administration in time range)    Or  ondansetron (ZOFRAN) injection 4 mg (has no administration in time range)  thiamine tablet 100 mg (100 mg Oral Given 10/16/21 0937)    Or  thiamine (B-1) injection 100 mg ( Intravenous See Alternative 07/11/94 2229)  folic acid (FOLVITE) tablet 1 mg (1 mg Oral Given 10/16/21 0937)  multivitamin with minerals tablet 1 tablet (1 tablet Oral Given 10/16/21 0937)  insulin glargine-yfgn (SEMGLEE) injection 10 Units (10 Units Subcutaneous Given 10/16/21 0419)  hydrALAZINE (APRESOLINE) injection 10 mg (has no administration in time range)  lactated ringers infusion ( Intravenous New Bag/Given 10/16/21 0830)  pantoprazole (PROTONIX) EC tablet 40 mg (40 mg Oral Given 10/16/21 0937)  droperidol (INAPSINE) 2.5 MG/ML injection 1.25 mg (1.25 mg Intravenous Given 10/16/21 0049)  diphenhydrAMINE (BENADRYL) injection 25 mg (25 mg Intravenous Given 10/16/21 0050)  HYDROmorphone (DILAUDID) injection 0.5 mg (0.5 mg Intravenous Given 10/16/21 0048)  sodium chloride 0.9 % bolus 250 mL (0 mLs Intravenous Stopped 10/16/21 0124)    Mobility walks Low fall risk    R Recommendations: See Admitting Provider Note  Report given to: Rosaria Ferries RN  Additional Notes:  Patient is deaf. Translator has been used at bedside.

## 2021-10-16 NOTE — H&P (Signed)
History and Physical    Patient: Troy Rush:[id number] DOB: 1970-12-31 DOA: 10/15/2021 DOS: the patient was seen and examined on 10/16/2021 PCP: Teena Dunk, NP  Patient coming from: Home  Chief Complaint:  Chief Complaint  Patient presents with   Abdominal Pain   HPI: Troy Rush is a 51 y.o. male with medical history significant of HFrEF EF 35-40%, NICM, HTN, DVT/PE, PAF, on eliquis, CKD 4.  Pt with history of recurrent pancreatitis in past.  Last definite admit for pancreatitis that I can see was Oct 2021.  Was admitted earlier this year in April for epigastric abdominal pain, N/V.  Lipase nl and no pancreatitis on that admissions CT scan though.  Suspected that recurrent pancreatitis may be product of his past history of EtOH abuse.  He has cut back but admits to continuing to drink socially.  Today he presents to the ED with sever abd pain.  Pt initially indicates his LLQ, but now indicating epigastric pain.  Pain is severe.  In ED: Work up demonstrates acute pancreatitis.  Hospitalist asked to admit.   Review of Systems: As mentioned in the history of present illness. All other systems reviewed and are negative. Past Medical History:  Diagnosis Date   AICD (automatic cardioverter/defibrillator) present 10/14/2016   Bilateral deafness    Bronchitis    CHF (congestive heart failure) ([place])    COVID-19    Depression    Diabetes mellitus without complication ([place])    DVT, lower extremity ([place]) 07/12/2019   Dyslipidemia    ESRD (end stage renal disease) ([place])    Reports stage IV CKD, Has not started dialysis d/t inability to communicate w/ nephrologist on his first appointment   Headache 08/2019   Hypertension    ICD (implantable cardioverter-defibrillator) in place    Insomnia 08/2019   NICM (nonischemic cardiomyopathy) ([place]) 09/15/2016   Pancreatitis    Renal disorder    Sleep apnea    Vitamin D deficiency 08/2019   Past Surgical History:  Procedure  Laterality Date   ATRIAL FIBRILLATION ABLATION N/A 03/02/2021   Procedure: ATRIAL FIBRILLATION ABLATION;  Surgeon: Constance Haw, MD;  Location: [facility];  Service: Cardiovascular;  Laterality: N/A;   BACK SURGERY     CARDIAC CATHETERIZATION     ESOPHAGOGASTRODUODENOSCOPY (EGD) WITH PROPOFOL N/A 02/02/2020   Procedure: ESOPHAGOGASTRODUODENOSCOPY (EGD) WITH PROPOFOL;  Surgeon: Arta Silence, MD;  Location: [facility];  Service: Endoscopy;  Laterality: N/A;   ICD IMPLANT  10/14/2016   ICD IMPLANT N/A 10/14/2016   Procedure: ICD Implant;  Surgeon: Deboraha Sprang, MD;  Location: [facility];  Service: Cardiovascular;  Laterality: N/A;   PRESSURE SENSOR/CARDIOMEMS N/A 09/10/2020   Procedure: PRESSURE SENSOR/CARDIOMEMS;  Surgeon: Larey Dresser, MD;  Location: [facility];  Service: Cardiovascular;  Laterality: N/A;   RIGHT HEART CATH N/A 05/21/2020   Procedure: RIGHT HEART CATH;  Surgeon: Larey Dresser, MD;  Location: [facility];  Service: Cardiovascular;  Laterality: N/A;   WRIST SURGERY     Social History:  reports that he has never smoked. He has never used smokeless tobacco. He reports that he does not currently use alcohol. He reports that he does not use drugs.  Allergies  Allergen Reactions   Lisinopril Cough    Family History  Problem Relation Age of Onset   Hypertension Mother    Healthy Father    Stroke Father     Prior to Admission  medications   Medication Sig Start Date End Date Taking? Authorizing Provider  acetaminophen (TYLENOL) 325 MG tablet Take 2 tablets (650 mg total) by mouth every 4 (four) hours as needed for mild pain (or temp > 37.5 C (99.5 F)). 10/07/20   Angiulli, Lavon Paganini, PA-C  apixaban (ELIQUIS) 5 MG TABS tablet TAKE 1 TABLET(5 MG) BY MOUTH TWICE DAILY 10/07/20   Angiulli, Lavon Paganini, PA-C  atorvastatin (LIPITOR) 80 MG tablet Take 1 tablet (80 mg total) by mouth at bedtime. 10/07/20   Angiulli, Lavon Paganini, PA-C  carvedilol  (COREG) 25 MG tablet TAKE 1 TABLET (25 MG TOTAL) BY MOUTH 2 (TWO) TIMES DAILY. 01/04/21 01/04/22  Consuelo Pandy, PA-C  Cholecalciferol (D3-1000 PO) Take 1,000 Units by mouth daily.    [provider]  Continuous Blood Gluc Sensor (FREESTYLE LIBRE 3 SENSOR) MISC 1 application by Does not apply route every 14 (fourteen) days. Place 1 sensor on the skin every 14 days. Use to check glucose continuously Patient not taking: Reported on 09/16/2021 04/19/21   Vevelyn Francois, NP  dapagliflozin propanediol (FARXIGA) 10 MG TABS tablet Take 1 tablet (10 mg total) by mouth daily before breakfast. 10/27/20   Lyda Jester M, PA-C  Evolocumab (REPATHA SURECLICK) 211 MG/ML SOAJ Inject 140 mg into the skin every 14 (fourteen) days. 12/29/20   Larey Dresser, MD  fenofibrate 54 MG tablet Take 1 tablet (54 mg total) by mouth daily. 07/07/21   Larey Dresser, MD  furosemide (LASIX) 80 MG tablet Take 1 tablet (80 mg total) by mouth every morning AND 0.5 tablets (40 mg total) every evening. 09/20/21   Joette Catching, PA-C  hydrALAZINE (APRESOLINE) 50 MG tablet Take 1 tablet (50 mg total) by mouth 3 (three) times daily. 08/05/21   Larey Dresser, MD  insulin glargine (LANTUS SOLOSTAR) 100 UNIT/ML Solostar Pen Inject 10 Units into the skin at bedtime. Patient taking differently: Inject 35 Units into the skin at bedtime. 07/22/21 07/22/22  Ghimire, Henreitta Leber, MD  Insulin Pen Needle (PEN NEEDLES) 31G X 6 MM MISC 1 pen by Does not apply route daily. 01/15/21 01/15/22  Vevelyn Francois, NP  isosorbide mononitrate (IMDUR) 60 MG 24 hr tablet Take 1 tablet (60 mg total) by mouth daily. 08/05/21   Larey Dresser, MD  latanoprost (XALATAN) 0.005 % ophthalmic solution Place 1 drop into both eyes at bedtime. 01/20/20   [provider]  meclizine (ANTIVERT) 25 MG tablet Take 25 mg by mouth 3 (three) times daily as needed for dizziness.    [provider]  metolazone (ZAROXOLYN) 2.5 MG tablet Take 1  tablet (2.5 mg total) by mouth as directed. Per Heart Failure Team instructions. Patient not taking: Reported on 09/16/2021 02/17/21 05/25/22  Darrick Grinder D, NP  ondansetron (ZOFRAN) 4 MG tablet Take 1 tablet (4 mg total) by mouth every 8 (eight) hours as needed for nausea or vomiting. 08/19/21 08/19/22  Caren Griffins, MD  sertraline (ZOLOFT) 50 MG tablet Take 1 tablet (50 mg total) by mouth daily. 03/18/21 09/14/21  Vevelyn Francois, NP  VASCEPA 1 g capsule Take 2 capsules (2 g total) by mouth 2 (two) times daily. 10/07/20   Cathlyn Parsons, PA-C    Physical Exam: Vitals:   10/15/21 2328 10/16/21 0059  BP: (!) 164/137 (!) 204/92  Pulse: 81 79  Resp: (!) 22 15  Temp: 98.9 F (37.2 C)   TempSrc: Oral   SpO2: 100% 95%   Constitutional:  Appears to be in significant pain. Eyes: PERRL, lids and conjunctivae normal ENMT: Mucous membranes are moist. Posterior pharynx clear of any exudate or lesions.Normal dentition.  Neck: normal, supple, no masses, no thyromegaly Respiratory: clear to auscultation bilaterally, no wheezing, no crackles. Normal respiratory effort. No accessory muscle use.  Cardiovascular: Regular rate and rhythm, no murmurs / rubs / gallops. No extremity edema. 2+ pedal pulses. No carotid bruits.  Abdomen: TTP, no guarding nor rebound Musculoskeletal: no clubbing / cyanosis. No joint deformity upper and lower extremities. Good ROM, no contractures. Normal muscle tone.  Skin: no rashes, lesions, ulcers. No induration Neurologic: CN 2-12 grossly intact. Sensation intact, DTR normal. Strength 5/5 in all 4.  Psychiatric: Normal judgment and insight. Alert and oriented x 3. Normal mood.   Data Reviewed:    Lipase 168  CBC    Component Value Date/Time   WBC 6.7 10/15/2021 2339   RBC 4.32 10/15/2021 2339   HGB 13.6 10/16/2021 0040   HGB 11.9 (L) 02/04/2021 1506   HCT 40.0 10/16/2021 0040   HCT 36.1 (L) 02/04/2021 1506   PLT 203 10/15/2021 2339   PLT 235 02/04/2021 1506    MCV 88.4 10/15/2021 2339   MCV 85 02/04/2021 1506   MCH 28.5 10/15/2021 2339   MCHC 32.2 10/15/2021 2339   RDW 12.8 10/15/2021 2339   RDW 13.6 02/04/2021 1506   LYMPHSABS 0.9 08/18/2021 1525   LYMPHSABS 1.4 07/19/2018 1100   MONOABS 0.5 08/18/2021 1525   EOSABS 0.0 08/18/2021 1525   EOSABS 0.0 07/19/2018 1100   BASOSABS 0.0 08/18/2021 1525   BASOSABS 0.0 07/19/2018 1100   CMP     Component Value Date/Time   NA 139 10/16/2021 0040   NA 136 09/07/2021 0923   K 4.6 10/16/2021 0040   CL 105 10/16/2021 0040   CO2 22 10/15/2021 2339   GLUCOSE 186 (H) 10/16/2021 0040   BUN 40 (H) 10/16/2021 0040   BUN 51 (H) 09/07/2021 0923   CREATININE 3.00 (H) 10/16/2021 0040   CREATININE 0.99 12/13/2016 1202   CALCIUM 9.6 10/15/2021 2339   PROT 8.0 10/15/2021 2339   PROT 7.7 04/19/2021 1221   ALBUMIN 4.0 10/15/2021 2339   ALBUMIN 4.3 04/19/2021 1221   AST 30 10/15/2021 2339   ALT 28 10/15/2021 2339   ALKPHOS 50 10/15/2021 2339   BILITOT 1.0 10/15/2021 2339   BILITOT 0.6 04/19/2021 1221   GFRNONAA 26 (L) 10/15/2021 2339   GFRNONAA >89 06/20/2016 1057   GFRAA 38 (L) 03/17/2020 1135   GFRAA >89 06/20/2016 1057   CT AP = IMPRESSION: 1. Findings consistent with acute pancreatitis of the pancreatic tail. No organized fluid collections allowing for absence of contrast 2. Negative for hydronephrosis or ureteral stone  Assessment and Plan: * Acute pancreatitis Pt with h/o acute pancreatitis recurrently in past it looks like. CT today confirms, and lipase mildly elevated. Looks like he was admitted to our service in Oct 2021 with acute pancreatitis.  Also had PE at that time. Triglycerides elevated in 2021 but doesn't look like anyone connected these to his pancreatitis presentation at that time. Check EtOH level Check lipid pnl for triglycerides Trend LFTs with repeat CMP tomorrow Hold lasix Gentle hydration with IVF NPO Check lactate Dilaudid PRN pain zofran PRN nausea  Paroxysmal  atrial fibrillation (HCC) S/p ablation 02/2021 Cont eliquis (for this and / or h/o PE). Cont coreg Tele monitor  CKD (chronic kidney disease), stage IV (HCC) Creat of 3 today looks to be  about his baseline. Not sure why his PMH section of chart says 'ESRD' he doesn't look to be on dialysis that I can see.  Several day hospital stay on our service in April this year = didn't need dialysis during that. Seems he is being followed by nephrology now per cards office note from May. monitor renal function for now strict intake and output  Chronic systolic CHF (congestive heart failure) ([place]) EF 35-40% echo done in May. S/p AICD Cont home BP meds Hold lasix in setting of acute pancreatitis.  Alcohol abuse Question of EtOH abuse: Patient denies this today in ED to EDP; However, admission H+P and DC summary from his admission in April this year to our service have this documented. Not clear if he drinks heavily or not still. Pt apparently just says he drinks "socially" according to multiple provider notes, cards office note, etc.  Cards actually thinks prior heavy EtOH abuse might be cause of his dilated NICM. Certainly prior EtOH abuse could explain his extensive history of recurrent pancreatitis. Check EtOH level Tele monitor CIWA - will hold off on PRN ativan order though Was on CIWA last admit, but had no symptoms of withdrawal  Type 2 diabetes mellitus with chronic kidney disease, with long-term current use of insulin ([place]) Hold Farxiga Hold home lantus 35u QHS Put on lantus 10u QHS And sensitive SSI Q4H  Hyperlipidemia On repatha + statin it looks like. Continue statin for now.  Deaf Uses sign language interpreter.  HTN (hypertension) Cont home BP meds except diuretics, BP running high in ED.      Advance Care Planning:   Code Status: Full Code  Consults: None  Family Communication: No family in room  Severity of Illness: The appropriate patient status for this  patient is OBSERVATION. Observation status is judged to be reasonable and necessary in order to provide the required intensity of service to ensure the patient's safety. The patient's presenting symptoms, physical exam findings, and initial radiographic and laboratory data in the context of their medical condition is felt to place them at decreased risk for further clinical deterioration. Furthermore, it is anticipated that the patient will be medically stable for discharge from the hospital within 2 midnights of admission.   Author: Etta Quill., DO 10/16/2021 2:22 AM  For on call review [email or web address].

## 2021-10-16 NOTE — Assessment & Plan Note (Addendum)
On repatha + statin it looks like. Continue statin for now.

## 2021-10-16 NOTE — Evaluation (Signed)
Physical Therapy Evaluation and Discharge Patient Details Name: Troy Rush MRN: [id number] DOB: 1970/06/15 Today's Date: 10/16/2021  History of Present Illness  51 y.o. male presents to the ED7/7/23 with severe abd pain. +acute pancreatitis; PMH significant of HFrEF EF 35-40%, NICM, HTN, DVT/PE, PAF, on eliquis, CKD 4, recurrent pancreatitis.  Clinical Impression   Patient evaluated by Physical Therapy with no further acute PT needs identified. Patient ambulating independently x 200 ft. PT is signing off. Thank you for this referral.        Recommendations for follow up therapy are one component of a multi-disciplinary discharge planning process, led by the attending physician.  Recommendations may be updated based on patient status, additional functional criteria and insurance authorization.  Follow Up Recommendations No PT follow up      Assistance Recommended at Discharge PRN  Patient can return home with the following  Assist for transportation    Equipment Recommendations None recommended by PT  Recommendations for Other Services       Functional Status Assessment Patient has not had a recent decline in their functional status     Precautions / Restrictions Precautions Precautions: None      Mobility  Bed Mobility Overal bed mobility: Independent                  Transfers Overall transfer level: Independent Equipment used: None                    Ambulation/Gait Ambulation/Gait assistance: Independent Gait Distance (Feet): 200 Feet Assistive device: None Gait Pattern/deviations: WFL(Within Functional Limits)          Stairs            Wheelchair Mobility    Modified Rankin (Stroke Patients Only)       Balance Overall balance assessment: Independent                                           Pertinent Vitals/Pain Pain Assessment Pain Assessment: No/denies pain    Home Living Family/patient expects to  be discharged to:: Private residence Living Arrangements: Non-relatives/Friends Available Help at Discharge: Friend(s);Available 24 hours/day Type of Home: Apartment Home Access: Stairs to enter Entrance Stairs-Rails: Can reach both Entrance Stairs-Number of Steps: 2   Home Layout: One level Home Equipment: Shower seat      Prior Function Prior Level of Function : Independent/Modified Independent;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; uses bus)               ADLs Comments: friend usually takes him to grocery store     [place] Hand: Right    Extremity/Trunk Assessment   Upper Extremity Assessment Upper Extremity Assessment: Defer to OT evaluation    Lower Extremity Assessment Lower Extremity Assessment: Overall WFL for tasks assessed    Cervical / Trunk Assessment Cervical / Trunk Assessment: Normal  Communication   Communication: Deaf;Prefers language other than English  Cognition Arousal/Alertness: Awake/alert Behavior During Therapy: WFL for tasks assessed/performed Overall Cognitive Status: Within Functional Limits for tasks assessed                                          General Comments      Exercises     Assessment/Plan  PT Assessment Patient does not need any further PT services  PT Problem List         PT Treatment Interventions      PT Goals (Current goals can be found in the Care Plan section)  Acute Rehab PT Goals Patient Stated Goal: none PT Goal Formulation: All assessment and education complete, DC therapy    Frequency       Co-evaluation PT/OT/SLP Co-Evaluation/Treatment: Yes Reason for Co-Treatment: Other (comment) (availability of interpreter) PT goals addressed during session: Mobility/safety with mobility;Balance         AM-PAC PT "6 Clicks" Mobility  Outcome Measure Help needed turning from your back to your side while in a flat bed without using bedrails?: None Help needed moving  from lying on your back to sitting on the side of a flat bed without using bedrails?: None Help needed moving to and from a bed to a chair (including a wheelchair)?: None Help needed standing up from a chair using your arms (e.g., wheelchair or bedside chair)?: None Help needed to walk in hospital room?: None Help needed climbing 3-5 steps with a railing? : None 6 Click Score: 24    End of Session   Activity Tolerance: Patient tolerated treatment well Patient left: in bed;with call bell/phone within reach (ED stretcher) Nurse Communication: Mobility status PT Visit Diagnosis: Difficulty in walking, not elsewhere classified (R26.2)    Time: 1024-1040 PT Time Calculation (min) (ACUTE ONLY): 16 min   Charges:   PT Evaluation $PT Eval Low Complexity: [place], PT Acute Rehabilitation Services  Office [phone]   Rexanne Mano 10/16/2021, 12:26 PM

## 2021-10-17 DIAGNOSIS — K859 Acute pancreatitis without necrosis or infection, unspecified: Secondary | ICD-10-CM | POA: Diagnosis not present

## 2021-10-17 LAB — COMPREHENSIVE METABOLIC PANEL
ALT: 22 U/L (ref 0–44)
AST: 20 U/L (ref 15–41)
Albumin: 3.5 g/dL (ref 3.5–5.0)
Alkaline Phosphatase: 47 U/L (ref 38–126)
Anion gap: 11 (ref 5–15)
BUN: 42 mg/dL — ABNORMAL HIGH (ref 6–20)
CO2: 26 mmol/L (ref 22–32)
Calcium: 9.2 mg/dL (ref 8.9–10.3)
Chloride: 102 mmol/L (ref 98–111)
Creatinine, Ser: 3.12 mg/dL — ABNORMAL HIGH (ref 0.61–1.24)
GFR, Estimated: 23 mL/min — ABNORMAL LOW (ref >60)
Glucose, Bld: 115 mg/dL — ABNORMAL HIGH (ref 70–99)
Potassium: 4 mmol/L (ref 3.5–5.1)
Sodium: 139 mmol/L (ref 135–145)
Total Bilirubin: 0.8 mg/dL (ref 0.3–1.2)
Total Protein: 7.5 g/dL (ref 6.5–8.1)

## 2021-10-17 LAB — MAGNESIUM: Magnesium: 2.7 mg/dL — ABNORMAL HIGH (ref 1.7–2.4)

## 2021-10-17 LAB — GLUCOSE, CAPILLARY
Glucose-Capillary: 138 mg/dL — ABNORMAL HIGH (ref 70–99)
Glucose-Capillary: 215 mg/dL — ABNORMAL HIGH (ref 70–99)
Glucose-Capillary: 250 mg/dL — ABNORMAL HIGH (ref 70–99)
Glucose-Capillary: 251 mg/dL — ABNORMAL HIGH (ref 70–99)
Glucose-Capillary: 291 mg/dL — ABNORMAL HIGH (ref 70–99)
Glucose-Capillary: 338 mg/dL — ABNORMAL HIGH (ref 70–99)
Glucose-Capillary: 97 mg/dL (ref 70–99)

## 2021-10-17 LAB — CBC WITH DIFFERENTIAL/PLATELET
Abs Immature Granulocytes: 0.01 10*3/uL (ref 0.00–0.07)
Basophils Absolute: 0 10*3/uL (ref 0.0–0.1)
Basophils Relative: 0 %
Eosinophils Absolute: 0.1 10*3/uL (ref 0.0–0.5)
Eosinophils Relative: 2 %
HCT: 37 % — ABNORMAL LOW (ref 39.0–52.0)
Hemoglobin: 11.5 g/dL — ABNORMAL LOW (ref 13.0–17.0)
Immature Granulocytes: 0 %
Lymphocytes Relative: 29 %
Lymphs Abs: 1.4 10*3/uL (ref 0.7–4.0)
MCH: 29 pg (ref 26.0–34.0)
MCHC: 31.1 g/dL (ref 30.0–36.0)
MCV: 93.2 fL (ref 80.0–100.0)
Monocytes Absolute: 0.7 10*3/uL (ref 0.1–1.0)
Monocytes Relative: 14 %
Neutro Abs: 2.7 10*3/uL (ref 1.7–7.7)
Neutrophils Relative %: 55 %
Platelets: 176 10*3/uL (ref 150–400)
RBC: 3.97 MIL/uL — ABNORMAL LOW (ref 4.22–5.81)
RDW: 13.2 % (ref 11.5–15.5)
WBC: 4.9 10*3/uL (ref 4.0–10.5)
nRBC: 0 % (ref 0.0–0.2)

## 2021-10-17 LAB — BRAIN NATRIURETIC PEPTIDE: B Natriuretic Peptide: 80.8 pg/mL (ref 0.0–100.0)

## 2021-10-17 LAB — LIPASE, BLOOD: Lipase: 68 U/L — ABNORMAL HIGH (ref 11–51)

## 2021-10-17 MED ORDER — INSULIN ASPART 100 UNIT/ML IJ SOLN
0.0000 [IU] | Freq: Three times a day (TID) | INTRAMUSCULAR | Status: DC
  Administered 2021-10-17: 3 [IU] via SUBCUTANEOUS
  Administered 2021-10-17: 7 [IU] via SUBCUTANEOUS
  Administered 2021-10-18: 2 [IU] via SUBCUTANEOUS

## 2021-10-17 MED ORDER — LACTATED RINGERS IV SOLN: INTRAVENOUS | Status: DC

## 2021-10-17 NOTE — Progress Notes (Signed)
Alcohol up                                                                   PROGRESS NOTE                                                                                                                                                                                                             Patient Demographics:    Troy Rush, is a 51 y.o. male, DOB - May 18, 1970, OXB:[id number]  Outpatient Primary MD for the patient is Bo Merino I, NP    LOS - 1  Admit date - 10/15/2021    Chief Complaint  Patient presents with   Abdominal Pain       Brief Narrative (HPI from H&P)   51 y.o. male with medical history significant of HFrEF EF 35-40%, NICM, HTN, DVT/PE, PAF, on eliquis, CKD 4, deaf, with history of pancreatitis in the past likely due to alcohol intake.  Presents to the hospital with abdominal pain diagnosed with acute pancreatitis and admitted to the hospital.   Subjective:   Patient in bed, appears comfortable, denies any headache, no fever, no chest pain or pressure, no shortness of breath , minimal epigastric abdominal pain. No new focal weakness.   Assessment  & Plan :    Acute pancreatitis likely due to alcohol abuse.  Counseled to quit alcohol, stable triglycerides, CT evidence of acute pancreatitis along with elevated lipase and epigastric pain, will be treated conservatively with bowel rest, IV fluids and pain control.  Continue outpatient Repatha plus statin for dyslipidemia and hypertriglyceridemia.   Paroxysmal atrial fibrillation ([place]) - S/p ablation 02/2021, [place] vas 2 score of greater than 3.  Continue Eliquis and Coreg combination.  CKD (chronic kidney disease), stage IV (HCC) - Creat of 3 today looks to be about his baseline.  Monitor with IV fluids for hydration.  Chronic systolic CHF EF 68% on recent echocardiogram.  Has AICD - currently appears compensated monitor with ongoing IV fluids for acute pancreatitis, continue Coreg, no ACE/ARB due to underlying CKD  4.  Alcohol abuse - counseled to quit all alcohol intake, CIWA protocol and monitor   Hyperlipidemia -  On repatha + statin, Continue statin for now.  Deaf - Uses sign language interpreter.  HTN (hypertension)  -  continue combination of Coreg, Imdur and hydralazine.  As needed IV hydralazine added.  Type 2 diabetes mellitus with chronic kidney disease, with long-term current use of insulin ([place]) - Hold Farxiga, cut down along with sliding scale monitor and adjust  Lab Results  Component Value Date   HGBA1C 6.4 (A) 07/28/2021   HGBA1C 6.4 07/28/2021   HGBA1C 6.4 07/28/2021   HGBA1C 6.4 07/28/2021   CBG (last 3)  Recent Labs    10/16/21 2138 10/17/21 0110 10/17/21 0404  GLUCAP 102* 97 138*        Condition -  Guarded  Family Communication  :  none present  Code Status :  Full  Consults  :  None  PUD Prophylaxis : PPI   Procedures  :     CT - Findings consistent with acute pancreatitis of the pancreatic tail. No organized fluid collections allowing for absence of contrast 2. Negative for hydronephrosis or ureteral stone      Disposition Plan  :    Status is: Inpatient  DVT Prophylaxis  :     apixaban (ELIQUIS) tablet 5 mg     Lab Results  Component Value Date   PLT 176 10/17/2021    Diet :  Diet Order             Diet clear liquid Room service appropriate? Yes; Fluid consistency: Thin  Diet effective now                    Inpatient Medications  Scheduled Meds:  apixaban  5 mg Oral BID   atorvastatin  80 mg Oral QHS   carvedilol  25 mg Oral BID WC   folic acid  1 mg Oral Daily   hydrALAZINE  50 mg Oral TID   insulin aspart  0-9 Units Subcutaneous TID WC   insulin glargine-yfgn  10 Units Subcutaneous QHS   isosorbide mononitrate  60 mg Oral Daily   multivitamin with minerals  1 tablet Oral Daily   pantoprazole  40 mg Oral Daily   thiamine  100 mg Oral Daily   Or   thiamine  100 mg Intravenous Daily   Continuous Infusions:   lactated ringers 50 mL/hr at 10/17/21 0805   PRN Meds:.acetaminophen **OR** acetaminophen, hydrALAZINE, ondansetron **OR** ondansetron (ZOFRAN) IV  Antibiotics  :    Anti-infectives (From admission, onward)    None        Time Spent in minutes  30   Lala Lund M.D on 10/17/2021 at 9:46 AM  To page go to [email or web address]   [facility] -  Office  [phone]  See all Orders from today for further details    Objective:   Vitals:   10/16/21 1745 10/16/21 2100 10/17/21 0417 10/17/21 0807  BP: (!) 152/89 139/83 (!) 170/86 (!) 166/91  Pulse: 69 66 70 72  Resp: 16 18 17 19   Temp: 98.7 F (37.1 C) 98.8 F (37.1 C) 98.1 F (36.7 C) 98.4 F (36.9 C)  TempSrc: Oral  Oral Oral  SpO2:  100% 99%   Weight: 104.5 kg     Height: 5\' 10"  (1.778 m)       Wt Readings from Last 3 Encounters:  10/16/21 104.5 kg  09/16/21 107.5 kg  09/07/21 104.3 kg     Intake/Output Summary (Last 24 hours) at 10/17/2021 0946 Last data filed at 10/17/2021 0600 Gross per 24 hour  Intake 1155.16 ml  Output --  Net 1155.16 ml  Physical Exam  Awake Alert, No new F.N deficits, Normal affect [place].AT,PERRAL Supple Neck, No JVD,   Symmetrical Chest wall movement, Good air movement bilaterally, CTAB RRR,No Gallops, Rubs or new Murmurs,  +ve B.Sounds, Abd Soft, No tenderness,   No Cyanosis, Clubbing or edema     Data Review:    CBC Recent Labs  Lab 10/15/21 2339 10/16/21 0040 10/17/21 0144  WBC 6.7  --  4.9  HGB 12.3* 13.6 11.5*  HCT 38.2* 40.0 37.0*  PLT 203  --  176  MCV 88.4  --  93.2  MCH 28.5  --  29.0  MCHC 32.2  --  31.1  RDW 12.8  --  13.2  LYMPHSABS  --   --  1.4  MONOABS  --   --  0.7  EOSABS  --   --  0.1  BASOSABS  --   --  0.0    Electrolytes Recent Labs  Lab 10/15/21 2339 10/16/21 0040 10/16/21 0225 10/17/21 0144  NA 138 139  --  139  K 4.7 4.6  --  4.0  CL 102 105  --  102  CO2 22  --   --  26  GLUCOSE 197* 186*  --  115*  BUN 39* 40*  --  42*   CREATININE 2.87* 3.00*  --  3.12*  CALCIUM 9.6  --   --  9.2  AST 30  --   --  20  ALT 28  --   --  22  ALKPHOS 50  --   --  47  BILITOT 1.0  --   --  0.8  ALBUMIN 4.0  --   --  3.5  MG  --   --   --  2.7*  LATICACIDVEN  --   --  1.1  --   BNP  --   --   --  80.8    ------------------------------------------------------------------------------------------------------------------ Recent Labs    10/16/21 0225  CHOL 194  HDL 51  LDLCALC 113*  TRIG 150*  CHOLHDL 3.8    Micro Results No results found for this or any previous visit (from the past 240 hour(s)).  Radiology Reports CT ABDOMEN PELVIS WO CONTRAST  Result Date: 10/16/2021 CLINICAL DATA:  Left lower quadrant pain with vomiting EXAM: CT ABDOMEN AND PELVIS WITHOUT CONTRAST TECHNIQUE: Multidetector CT imaging of the abdomen and pelvis was performed following the standard protocol without IV contrast. RADIATION DOSE REDUCTION: This exam was performed according to the departmental dose-optimization program which includes automated exposure control, adjustment of the mA and/or kV according to patient size and/or use of iterative reconstruction technique. COMPARISON:  CT 07/17/2021 FINDINGS: Lower chest: No acute abnormality. Hepatobiliary: No focal liver abnormality is seen. No gallstones, gallbladder wall thickening, or biliary dilatation. Pancreas: Indistinct appearance of the pancreatic tail with mild surrounding stranding. No fluid collections. Spleen: Normal in size without focal abnormality. Adrenals/Urinary Tract: Adrenal glands are unremarkable. Kidneys are normal, without renal calculi, focal lesion, or hydronephrosis. Bladder is unremarkable. Stomach/Bowel: Stomach is within normal limits. Appendix appears normal. No evidence of bowel wall thickening, distention, or inflammatory changes. Vascular/Lymphatic: Mild aortic atherosclerosis. No aneurysm. No suspicious lymph nodes. Reproductive: Prostate is unremarkable. Other:  Negative for pelvic effusion or free air. Small fat containing umbilical hernia Musculoskeletal: No acute or significant osseous findings. IMPRESSION: 1. Findings consistent with acute pancreatitis of the pancreatic tail. No organized fluid collections allowing for absence of contrast 2. Negative for hydronephrosis or ureteral stone Electronically Signed  By: Donavan Foil M.D.   On: 10/16/2021 00:44

## 2021-10-18 DIAGNOSIS — K859 Acute pancreatitis without necrosis or infection, unspecified: Secondary | ICD-10-CM | POA: Diagnosis not present

## 2021-10-18 LAB — CBC WITH DIFFERENTIAL/PLATELET
Abs Immature Granulocytes: 0.01 10*3/uL (ref 0.00–0.07)
Basophils Absolute: 0 10*3/uL (ref 0.0–0.1)
Basophils Relative: 1 %
Eosinophils Absolute: 0.2 10*3/uL (ref 0.0–0.5)
Eosinophils Relative: 4 %
HCT: 35.3 % — ABNORMAL LOW (ref 39.0–52.0)
Hemoglobin: 11.1 g/dL — ABNORMAL LOW (ref 13.0–17.0)
Immature Granulocytes: 0 %
Lymphocytes Relative: 31 %
Lymphs Abs: 1.3 10*3/uL (ref 0.7–4.0)
MCH: 28.8 pg (ref 26.0–34.0)
MCHC: 31.4 g/dL (ref 30.0–36.0)
MCV: 91.7 fL (ref 80.0–100.0)
Monocytes Absolute: 0.6 10*3/uL (ref 0.1–1.0)
Monocytes Relative: 14 %
Neutro Abs: 2.1 10*3/uL (ref 1.7–7.7)
Neutrophils Relative %: 50 %
Platelets: 178 10*3/uL (ref 150–400)
RBC: 3.85 MIL/uL — ABNORMAL LOW (ref 4.22–5.81)
RDW: 13.2 % (ref 11.5–15.5)
WBC: 4.1 10*3/uL (ref 4.0–10.5)
nRBC: 0 % (ref 0.0–0.2)

## 2021-10-18 LAB — COMPREHENSIVE METABOLIC PANEL
ALT: 20 U/L (ref 0–44)
AST: 21 U/L (ref 15–41)
Albumin: 3.3 g/dL — ABNORMAL LOW (ref 3.5–5.0)
Alkaline Phosphatase: 45 U/L (ref 38–126)
Anion gap: 11 (ref 5–15)
BUN: 36 mg/dL — ABNORMAL HIGH (ref 6–20)
CO2: 25 mmol/L (ref 22–32)
Calcium: 9.1 mg/dL (ref 8.9–10.3)
Chloride: 100 mmol/L (ref 98–111)
Creatinine, Ser: 2.66 mg/dL — ABNORMAL HIGH (ref 0.61–1.24)
GFR, Estimated: 28 mL/min — ABNORMAL LOW (ref >60)
Glucose, Bld: 219 mg/dL — ABNORMAL HIGH (ref 70–99)
Potassium: 4.2 mmol/L (ref 3.5–5.1)
Sodium: 136 mmol/L (ref 135–145)
Total Bilirubin: 0.9 mg/dL (ref 0.3–1.2)
Total Protein: 7.2 g/dL (ref 6.5–8.1)

## 2021-10-18 LAB — GLUCOSE, CAPILLARY
Glucose-Capillary: 196 mg/dL — ABNORMAL HIGH (ref 70–99)
Glucose-Capillary: 164 mg/dL — ABNORMAL HIGH (ref 70–99)
Glucose-Capillary: 185 mg/dL — ABNORMAL HIGH (ref 70–99)
Glucose-Capillary: 188 mg/dL — ABNORMAL HIGH (ref 70–99)
Glucose-Capillary: 208 mg/dL — ABNORMAL HIGH (ref 70–99)

## 2021-10-18 LAB — LIPASE, BLOOD: Lipase: 54 U/L — ABNORMAL HIGH (ref 11–51)

## 2021-10-18 LAB — BRAIN NATRIURETIC PEPTIDE: B Natriuretic Peptide: 51.3 pg/mL (ref 0.0–100.0)

## 2021-10-18 LAB — MAGNESIUM: Magnesium: 2.8 mg/dL — ABNORMAL HIGH (ref 1.7–2.4)

## 2021-10-18 MED ORDER — INSULIN ASPART 100 UNIT/ML IJ SOLN
0.0000 [IU] | Freq: Three times a day (TID) | INTRAMUSCULAR | Status: DC
  Administered 2021-10-18 (×2): 3 [IU] via SUBCUTANEOUS
  Administered 2021-10-19: 2 [IU] via SUBCUTANEOUS
  Administered 2021-10-19: 8 [IU] via SUBCUTANEOUS

## 2021-10-18 MED ORDER — AMLODIPINE BESYLATE 10 MG PO TABS
10.0000 mg | ORAL_TABLET | Freq: Every day | ORAL | Status: DC
  Administered 2021-10-18 – 2021-10-19 (×2): 10 mg via ORAL
  Filled 2021-10-18 (×2): qty 1

## 2021-10-18 MED ORDER — INSULIN ASPART 100 UNIT/ML IJ SOLN
0.0000 [IU] | Freq: Every day | INTRAMUSCULAR | Status: DC
  Administered 2021-10-18: 2 [IU] via SUBCUTANEOUS

## 2021-10-18 MED ORDER — INSULIN GLARGINE-YFGN 100 UNIT/ML ~~LOC~~ SOLN
20.0000 [IU] | Freq: Every day | SUBCUTANEOUS | Status: DC
  Administered 2021-10-18 – 2021-10-19 (×2): 20 [IU] via SUBCUTANEOUS
  Filled 2021-10-18 (×2): qty 0.2

## 2021-10-18 NOTE — Plan of Care (Signed)
No acute events overnight. Problem: Education: Goal: Ability to describe self-care measures that may prevent or decrease complications (Diabetes Survival Skills Education) will improve Outcome: Progressing Goal: Individualized Educational Video(s) Outcome: Progressing   Problem: Coping: Goal: Ability to adjust to condition or change in health will improve Outcome: Progressing   Problem: Fluid Volume: Goal: Ability to maintain a balanced intake and output will improve Outcome: Progressing   Problem: Health Behavior/Discharge Planning: Goal: Ability to identify and utilize available resources and services will improve Outcome: Progressing Goal: Ability to manage health-related needs will improve Outcome: Progressing   Problem: Metabolic: Goal: Ability to maintain appropriate glucose levels will improve Outcome: Progressing   Problem: Nutritional: Goal: Maintenance of adequate nutrition will improve Outcome: Progressing Goal: Progress toward achieving an optimal weight will improve Outcome: Progressing   Problem: Skin Integrity: Goal: Risk for impaired skin integrity will decrease Outcome: Progressing   Problem: Tissue Perfusion: Goal: Adequacy of tissue perfusion will improve Outcome: Progressing   Problem: Education: Goal: Knowledge of General Education information will improve Description: Including pain rating scale, medication(s)/side effects and non-pharmacologic comfort measures Outcome: Progressing   Problem: Health Behavior/Discharge Planning: Goal: Ability to manage health-related needs will improve Outcome: Progressing   Problem: Clinical Measurements: Goal: Ability to maintain clinical measurements within normal limits will improve Outcome: Progressing Goal: Will remain free from infection Outcome: Progressing Goal: Diagnostic test results will improve Outcome: Progressing Goal: Respiratory complications will improve Outcome: Progressing Goal:  Cardiovascular complication will be avoided Outcome: Progressing   Problem: Activity: Goal: Risk for activity intolerance will decrease Outcome: Progressing   Problem: Nutrition: Goal: Adequate nutrition will be maintained Outcome: Progressing   Problem: Coping: Goal: Level of anxiety will decrease Outcome: Progressing   Problem: Elimination: Goal: Will not experience complications related to bowel motility Outcome: Progressing Goal: Will not experience complications related to urinary retention Outcome: Progressing   Problem: Pain Managment: Goal: General experience of comfort will improve Outcome: Progressing   Problem: Safety: Goal: Ability to remain free from injury will improve Outcome: Progressing   Problem: Skin Integrity: Goal: Risk for impaired skin integrity will decrease Outcome: Progressing   

## 2021-10-18 NOTE — Progress Notes (Signed)
Alcohol up                                                                   PROGRESS NOTE                                                                                                                                                                                                             Patient Demographics:    Troy Rush, is a 51 y.o. male, DOB - 31-May-1970, BPZ:[id number]  Outpatient Primary MD for the patient is Bo Merino I, NP (Inactive)    LOS - 2  Admit date - 10/15/2021    Chief Complaint  Patient presents with   Abdominal Pain       Brief Narrative (HPI from H&P)   51 y.o. male with medical history significant of HFrEF EF 35-40%, NICM, HTN, DVT/PE, PAF, on eliquis, CKD 4, deaf, with history of pancreatitis in the past likely due to alcohol intake.  Presents to the hospital with abdominal pain diagnosed with acute pancreatitis and admitted to the hospital.   Subjective:   Patient in bed, appears comfortable, denies any headache, no fever, no chest pain or pressure, no shortness of breath , no abdominal pain. No new focal weakness.    Assessment  & Plan :    Acute pancreatitis likely due to alcohol abuse.  Counseled to quit alcohol, stable triglycerides, CT evidence of acute pancreatitis along with elevated lipase and epigastric pain, will be treated conservatively with bowel rest, IV fluids and pain control.  Continue outpatient Repatha plus statin for dyslipidemia and hypertriglyceridemia.  Stable triglyceride levels, overall improved advance diet.  If stable likely discharge tomorrow with outpatient GI follow-up.   Paroxysmal atrial fibrillation ([place]) - S/p ablation 02/2021, [place] vas 2 score of greater than 3.  Continue Eliquis and Coreg combination.  CKD (chronic kidney disease), stage IV (HCC) - Creat of 3 today looks to be about his baseline.  Monitor with IV fluids for hydration.  Chronic systolic CHF EF 24% on recent echocardiogram.  Has AICD - currently  appears compensated monitor with ongoing IV fluids for acute pancreatitis, continue Coreg, no ACE/ARB due to underlying CKD 4.  Alcohol abuse - counseled to quit all alcohol intake, CIWA protocol and monitor   Hyperlipidemia -  On repatha + statin, Continue statin for now.  Deaf - Uses sign language interpreter.  HTN (hypertension)  - in poor control added Norvasc, continue combination of Coreg, Imdur and hydralazine.  As needed IV hydralazine added.  Type 2 diabetes mellitus with chronic kidney disease, with long-term current use of insulin ([place]) - Hold Farxiga, cut down along with sliding scale monitor and adjust  Lab Results  Component Value Date   HGBA1C 6.4 (A) 07/28/2021   HGBA1C 6.4 07/28/2021   HGBA1C 6.4 07/28/2021   HGBA1C 6.4 07/28/2021   CBG (last 3)  Recent Labs    10/17/21 2353 10/18/21 0411 10/18/21 0739  GLUCAP 251* 196* 164*        Condition -  Guarded  Family Communication  :  none present  Code Status :  Full  Consults  :  None  PUD Prophylaxis : PPI   Procedures  :     CT - Findings consistent with acute pancreatitis of the pancreatic tail. No organized fluid collections allowing for absence of contrast 2. Negative for hydronephrosis or ureteral stone      Disposition Plan  :    Status is: Inpatient  DVT Prophylaxis  :     apixaban (ELIQUIS) tablet 5 mg     Lab Results  Component Value Date   PLT 178 10/18/2021    Diet :  Diet Order             DIET SOFT Room service appropriate? Yes; Fluid consistency: Thin  Diet effective now                    Inpatient Medications  Scheduled Meds:  amLODipine  10 mg Oral Daily   apixaban  5 mg Oral BID   atorvastatin  80 mg Oral QHS   carvedilol  25 mg Oral BID WC   folic acid  1 mg Oral Daily   hydrALAZINE  50 mg Oral TID   insulin aspart  0-9 Units Subcutaneous TID WC   insulin glargine-yfgn  10 Units Subcutaneous QHS   isosorbide mononitrate  60 mg Oral Daily    multivitamin with minerals  1 tablet Oral Daily   pantoprazole  40 mg Oral Daily   thiamine  100 mg Oral Daily   Or   thiamine  100 mg Intravenous Daily   Continuous Infusions:   PRN Meds:.acetaminophen **OR** acetaminophen, hydrALAZINE, ondansetron **OR** ondansetron (ZOFRAN) IV  Antibiotics  :    Anti-infectives (From admission, onward)    None        Time Spent in minutes  30   Lala Lund M.D on 10/18/2021 at 10:03 AM  To page go to [email or web address]   [facility] -  Office  [phone]  See all Orders from today for further details    Objective:   Vitals:   10/17/21 1912 10/17/21 2350 10/18/21 0413 10/18/21 0700  BP: 118/71 (!) 159/83 (!) 172/96 (!) 198/105  Pulse: 68 74 73 72  Resp: 17 15 16 18   Temp: 97.8 F (36.6 C) 97.8 F (36.6 C) 97.6 F (36.4 C) 98 F (36.7 C)  TempSrc: Oral   Oral  SpO2: 98% 98% 98% 98%  Weight:      Height:        Wt Readings from Last 3 Encounters:  10/16/21 104.5 kg  09/16/21 107.5 kg  09/07/21 104.3 kg     Intake/Output Summary (Last 24 hours) at 10/18/2021 1003 Last data filed at 10/17/2021  1723 Gross per 24 hour  Intake 401.65 ml  Output --  Net 401.65 ml     Physical Exam  Awake Alert, No new F.N deficits, Normal affect [place].AT,PERRAL Supple Neck, No JVD,   Symmetrical Chest wall movement, Good air movement bilaterally, CTAB RRR,No Gallops, Rubs or new Murmurs,  +ve B.Sounds, Abd Soft, No tenderness,   No Cyanosis, Clubbing or edema    Data Review:    CBC Recent Labs  Lab 10/15/21 2339 10/16/21 0040 10/17/21 0144 10/18/21 0205  WBC 6.7  --  4.9 4.1  HGB 12.3* 13.6 11.5* 11.1*  HCT 38.2* 40.0 37.0* 35.3*  PLT 203  --  176 178  MCV 88.4  --  93.2 91.7  MCH 28.5  --  29.0 28.8  MCHC 32.2  --  31.1 31.4  RDW 12.8  --  13.2 13.2  LYMPHSABS  --   --  1.4 1.3  MONOABS  --   --  0.7 0.6  EOSABS  --   --  0.1 0.2  BASOSABS  --   --  0.0 0.0    Electrolytes Recent Labs  Lab 10/15/21 2339  10/16/21 0040 10/16/21 0225 10/17/21 0144 10/18/21 0205  NA 138 139  --  139 136  K 4.7 4.6  --  4.0 4.2  CL 102 105  --  102 100  CO2 22  --   --  26 25  GLUCOSE 197* 186*  --  115* 219*  BUN 39* 40*  --  42* 36*  CREATININE 2.87* 3.00*  --  3.12* 2.66*  CALCIUM 9.6  --   --  9.2 9.1  AST 30  --   --  20 21  ALT 28  --   --  22 20  ALKPHOS 50  --   --  47 45  BILITOT 1.0  --   --  0.8 0.9  ALBUMIN 4.0  --   --  3.5 3.3*  MG  --   --   --  2.7* 2.8*  LATICACIDVEN  --   --  1.1  --   --   BNP  --   --   --  80.8 51.3    ------------------------------------------------------------------------------------------------------------------ Recent Labs    10/16/21 0225  CHOL 194  HDL 51  LDLCALC 113*  TRIG 150*  CHOLHDL 3.8    Micro Results No results found for this or any previous visit (from the past 240 hour(s)).  Radiology Reports CT ABDOMEN PELVIS WO CONTRAST  Result Date: 10/16/2021 CLINICAL DATA:  Left lower quadrant pain with vomiting EXAM: CT ABDOMEN AND PELVIS WITHOUT CONTRAST TECHNIQUE: Multidetector CT imaging of the abdomen and pelvis was performed following the standard protocol without IV contrast. RADIATION DOSE REDUCTION: This exam was performed according to the departmental dose-optimization program which includes automated exposure control, adjustment of the mA and/or kV according to patient size and/or use of iterative reconstruction technique. COMPARISON:  CT 07/17/2021 FINDINGS: Lower chest: No acute abnormality. Hepatobiliary: No focal liver abnormality is seen. No gallstones, gallbladder wall thickening, or biliary dilatation. Pancreas: Indistinct appearance of the pancreatic tail with mild surrounding stranding. No fluid collections. Spleen: Normal in size without focal abnormality. Adrenals/Urinary Tract: Adrenal glands are unremarkable. Kidneys are normal, without renal calculi, focal lesion, or hydronephrosis. Bladder is unremarkable. Stomach/Bowel: Stomach  is within normal limits. Appendix appears normal. No evidence of bowel wall thickening, distention, or inflammatory changes. Vascular/Lymphatic: Mild aortic atherosclerosis. No aneurysm. No suspicious lymph nodes. Reproductive:  Prostate is unremarkable. Other: Negative for pelvic effusion or free air. Small fat containing umbilical hernia Musculoskeletal: No acute or significant osseous findings. IMPRESSION: 1. Findings consistent with acute pancreatitis of the pancreatic tail. No organized fluid collections allowing for absence of contrast 2. Negative for hydronephrosis or ureteral stone Electronically Signed   By: Donavan Foil M.D.   On: 10/16/2021 00:44

## 2021-10-19 ENCOUNTER — Telehealth (HOSPITAL_COMMUNITY): Payer: Self-pay | Admitting: Licensed Clinical Social Worker

## 2021-10-19 DIAGNOSIS — K859 Acute pancreatitis without necrosis or infection, unspecified: Secondary | ICD-10-CM | POA: Diagnosis not present

## 2021-10-19 LAB — COMPREHENSIVE METABOLIC PANEL
ALT: 19 U/L (ref 0–44)
AST: 21 U/L (ref 15–41)
Albumin: 3.3 g/dL — ABNORMAL LOW (ref 3.5–5.0)
Alkaline Phosphatase: 46 U/L (ref 38–126)
Anion gap: 9 (ref 5–15)
BUN: 34 mg/dL — ABNORMAL HIGH (ref 6–20)
CO2: 25 mmol/L (ref 22–32)
Calcium: 9.4 mg/dL (ref 8.9–10.3)
Chloride: 103 mmol/L (ref 98–111)
Creatinine, Ser: 2.51 mg/dL — ABNORMAL HIGH (ref 0.61–1.24)
GFR, Estimated: 30 mL/min — ABNORMAL LOW (ref >60)
Glucose, Bld: 141 mg/dL — ABNORMAL HIGH (ref 70–99)
Potassium: 3.9 mmol/L (ref 3.5–5.1)
Sodium: 137 mmol/L (ref 135–145)
Total Bilirubin: 0.7 mg/dL (ref 0.3–1.2)
Total Protein: 7 g/dL (ref 6.5–8.1)

## 2021-10-19 LAB — CBC WITH DIFFERENTIAL/PLATELET
Abs Immature Granulocytes: 0.01 10*3/uL (ref 0.00–0.07)
Basophils Absolute: 0 10*3/uL (ref 0.0–0.1)
Basophils Relative: 1 %
Eosinophils Absolute: 0.1 10*3/uL (ref 0.0–0.5)
Eosinophils Relative: 3 %
HCT: 34.3 % — ABNORMAL LOW (ref 39.0–52.0)
Hemoglobin: 10.9 g/dL — ABNORMAL LOW (ref 13.0–17.0)
Immature Granulocytes: 0 %
Lymphocytes Relative: 34 %
Lymphs Abs: 1.3 10*3/uL (ref 0.7–4.0)
MCH: 28.8 pg (ref 26.0–34.0)
MCHC: 31.8 g/dL (ref 30.0–36.0)
MCV: 90.5 fL (ref 80.0–100.0)
Monocytes Absolute: 0.6 10*3/uL (ref 0.1–1.0)
Monocytes Relative: 15 %
Neutro Abs: 1.7 10*3/uL (ref 1.7–7.7)
Neutrophils Relative %: 47 %
Platelets: 185 10*3/uL (ref 150–400)
RBC: 3.79 MIL/uL — ABNORMAL LOW (ref 4.22–5.81)
RDW: 13 % (ref 11.5–15.5)
WBC: 3.7 10*3/uL — ABNORMAL LOW (ref 4.0–10.5)
nRBC: 0 % (ref 0.0–0.2)

## 2021-10-19 LAB — GLUCOSE, CAPILLARY
Glucose-Capillary: 125 mg/dL — ABNORMAL HIGH (ref 70–99)
Glucose-Capillary: 141 mg/dL — ABNORMAL HIGH (ref 70–99)
Glucose-Capillary: 146 mg/dL — ABNORMAL HIGH (ref 70–99)
Glucose-Capillary: 285 mg/dL — ABNORMAL HIGH (ref 70–99)

## 2021-10-19 LAB — LIPASE, BLOOD: Lipase: 65 U/L — ABNORMAL HIGH (ref 11–51)

## 2021-10-19 LAB — MAGNESIUM: Magnesium: 2.3 mg/dL (ref 1.7–2.4)

## 2021-10-19 LAB — IGG 4: IgG, Subclass 4: 95 mg/dL (ref 2–96)

## 2021-10-19 LAB — BRAIN NATRIURETIC PEPTIDE: B Natriuretic Peptide: 107.2 pg/mL — ABNORMAL HIGH (ref 0.0–100.0)

## 2021-10-19 NOTE — Plan of Care (Signed)
                                      [facility]                            [address] [place], [place] [address]      Troy Rush was admitted to the Hospital on 10/15/2021 and Discharged  10/19/2021 and should be excused from work/school   For 7 days starting from date -  10/15/2021 , may return to work/school without any restrictions.  Call Lala Lund MD, [facility]  [phone] with questions.  Lala Lund M.D on 10/19/2021,at 11:29 AM  [facility]   Office  [phone]

## 2021-10-19 NOTE — Care Management Important Message (Signed)
Important Message  Patient Details  Name: Troy Rush MRN: [id number] Date of Birth: 10-Feb-1971   Medicare Important Message Given:  Yes     Orbie Pyo 10/19/2021, 3:13 PM

## 2021-10-19 NOTE — Discharge Summary (Signed)
Troy Rush PPI:[id number] DOB: 17-Mar-1971 DOA: 10/15/2021  PCP: Bo Merino I, NP (Inactive)  Admit date: 10/15/2021  Discharge date: 10/19/2021  Admitted From: Home   Disposition:  Home   Recommendations for Outpatient Follow-up:   Follow up with PCP in 1-2 weeks  PCP Please obtain BMP/CBC, 2 view CXR in 1week,  (see Discharge instructions)   PCP Please follow up on the following pending results: Needs close outpatient follow-up with his primary gastroenterologist for recurrent pancreatitis, follow-up on IgG4 levels which are pending.   Home Health: None Equipment/Devices: None Consultations: None  Discharge Condition: Stable    CODE STATUS: Full    Diet Recommendation: Heart Healthy Low Carb    Chief Complaint  Patient presents with   Abdominal Pain     Brief history of present illness from the day of admission and additional interim summary    51 y.o. male with medical history significant of HFrEF EF 35-40%, NICM, HTN, DVT/PE, PAF, on eliquis, CKD 4, deaf, with history of pancreatitis in the past likely due to alcohol intake.  Presents to the hospital with abdominal pain diagnosed with acute pancreatitis and admitted to the hospital.                                                                 Hospital Course    Acute pancreatitis likely due to alcohol abuse.  Counseled to quit alcohol, stable triglycerides, CT evidence of acute pancreatitis along with elevated lipase and epigastric pain, has history of hypertriglyceridemia but levels were stable, he denies any alcohol abuse, no clear trigger for his pancreatitis could be identified, he was treated with conservative management with bowel rest and IV fluids and within couple of days he is back to his baseline tolerating diet.  He will be discharged  home on his home medications with close outpatient GI follow-up.  I have ordered IgG4 levels which are pending request PCP and primary GI to follow.     Paroxysmal atrial fibrillation ([place]) - S/p ablation 02/2021, [place] vas 2 score of greater than 3.  Continue Eliquis and Coreg combination.   CKD (chronic kidney disease), stage IV (HCC) - Creat of 3 today looks to be about his baseline.  Had mild AKI which has resolved after IV fluids   Chronic systolic CHF EF 06% on recent echocardiogram.  Has AICD - currently appears compensated will continue home regimen and follow with PCP on a close basis   ? H/O Alcohol abuse - denies abusing alcohol, no signs of DTs.   Hyperlipidemia -  On repatha + statin, continue the same regimen.   Deaf - Uses sign language interpreter.   HTN (hypertension)  -continue home regimen follow with PCP   Type 2 diabetes mellitus with chronic kidney disease, with long-term current  use of insulin ([place]) -continue home regimen.   Discharge diagnosis     Principal Problem:   Acute pancreatitis Active Problems:   Chronic systolic CHF (congestive heart failure) (HCC)   CKD (chronic kidney disease), stage IV (HCC)   Paroxysmal atrial fibrillation (HCC)   HTN (hypertension)   Deaf   Hyperlipidemia   Type 2 diabetes mellitus with chronic kidney disease, with long-term current use of insulin (HCC)   Alcohol abuse    Discharge instructions    Discharge Instructions     Diet - low sodium heart healthy   Complete by: As directed    Discharge instructions   Complete by: As directed    Follow with Primary MD Bo Merino I, NP (Inactive) in 7 days   Get CBC, CMP, 2 view Chest X ray -  checked next visit within 1 week by Primary MD    Activity: As tolerated with Full fall precautions use walker/cane & assistance as needed  Disposition Home    Diet: Heart Healthy  Low Carb, check CBGs q. ACH S.  Special Instructions: If you have smoked or chewed Tobacco   in the last 2 yrs please stop smoking, stop any regular Alcohol  and or any Recreational drug use.  On your next visit with your primary care physician please Get Medicines reviewed and adjusted.  Please request your Prim.MD to go over all Hospital Tests and Procedure/Radiological results at the follow up, please get all Hospital records sent to your Prim MD by signing hospital release before you go home.  If you experience worsening of your admission symptoms, develop shortness of breath, life threatening emergency, suicidal or homicidal thoughts you must seek medical attention immediately by calling 911 or calling your MD immediately  if symptoms less severe.  You Must read complete instructions/literature along with all the possible adverse reactions/side effects for all the Medicines you take and that have been prescribed to you. Take any new Medicines after you have completely understood and accpet all the possible adverse reactions/side effects.   Increase activity slowly   Complete by: As directed        Discharge Medications   Allergies as of 10/19/2021       Reactions   Lisinopril Cough        Medication List     STOP taking these medications    latanoprost 0.005 % ophthalmic solution Commonly known as: XALATAN       TAKE these medications    amLODipine 5 MG tablet Commonly known as: NORVASC Take 5 mg by mouth daily.   apixaban 5 MG Tabs tablet Commonly known as: Eliquis TAKE 1 TABLET(5 MG) BY MOUTH TWICE DAILY What changed:  how much to take how to take this when to take this additional instructions   atorvastatin 80 MG tablet Commonly known as: LIPITOR Take 1 tablet (80 mg total) by mouth at bedtime.   carvedilol 25 MG tablet Commonly known as: COREG TAKE 1 TABLET (25 MG TOTAL) BY MOUTH 2 (TWO) TIMES DAILY. What changed: when to take this   dapagliflozin propanediol 10 MG Tabs tablet Commonly known as: Farxiga Take 1 tablet (10 mg total) by  mouth daily before breakfast.   fenofibrate 54 MG tablet Take 1 tablet (54 mg total) by mouth daily.   FreeStyle Libre 3 Sensor Misc 1 application by Does not apply route every 14 (fourteen) days. Place 1 sensor on the skin every 14 days. Use to check glucose continuously   furosemide  80 MG tablet Commonly known as: LASIX Take 1 tablet (80 mg total) by mouth every morning AND 0.5 tablets (40 mg total) every evening. What changed: See the new instructions.   hydrALAZINE 50 MG tablet Commonly known as: APRESOLINE Take 1 tablet (50 mg total) by mouth 3 (three) times daily.   isosorbide mononitrate 60 MG 24 hr tablet Commonly known as: IMDUR Take 1 tablet (60 mg total) by mouth daily.   Lantus SoloStar 100 UNIT/ML Solostar Pen Generic drug: insulin glargine Inject 10 Units into the skin at bedtime.   Levemir FlexPen 100 UNIT/ML FlexPen Generic drug: insulin detemir Inject 35 Units into the skin at bedtime.   meclizine 25 MG tablet Commonly known as: ANTIVERT Take 25 mg by mouth 3 (three) times daily as needed for dizziness.   ondansetron 4 MG tablet Commonly known as: Zofran Take 1 tablet (4 mg total) by mouth every 8 (eight) hours as needed for nausea or vomiting.   Pen Needles 31G X 6 MM Misc 1 pen by Does not apply route daily.   Repatha SureClick 283 MG/ML Soaj Generic drug: Evolocumab Inject 140 mg into the skin every 14 (fourteen) days.   sertraline 50 MG tablet Commonly known as: ZOLOFT Take 1 tablet (50 mg total) by mouth daily.   Vascepa 1 g capsule Generic drug: icosapent Ethyl Take 2 capsules (2 g total) by mouth 2 (two) times daily.         Follow-up Information     Bo Merino I, NP. Schedule an appointment as soon as possible for a visit in 1 week(s).   Specialty: Nurse Practitioner Contact information: [address] [place] [address] [phone]         Larey Dresser, MD. Schedule an appointment as soon as possible for a  visit in 1 week(s).   Specialty: Cardiology Contact information: [place] [address] [phone]         Otis Brace, MD. Schedule an appointment as soon as possible for a visit in 1 week(s).   Specialty: Gastroenterology Contact information: [place] [address] [phone]                 Major procedures and Radiology Reports - PLEASE review detailed and final reports thoroughly  -       CT ABDOMEN PELVIS WO CONTRAST  Result Date: 10/16/2021 CLINICAL DATA:  Left lower quadrant pain with vomiting EXAM: CT ABDOMEN AND PELVIS WITHOUT CONTRAST TECHNIQUE: Multidetector CT imaging of the abdomen and pelvis was performed following the standard protocol without IV contrast. RADIATION DOSE REDUCTION: This exam was performed according to the departmental dose-optimization program which includes automated exposure control, adjustment of the mA and/or kV according to patient size and/or use of iterative reconstruction technique. COMPARISON:  CT 07/17/2021 FINDINGS: Lower chest: No acute abnormality. Hepatobiliary: No focal liver abnormality is seen. No gallstones, gallbladder wall thickening, or biliary dilatation. Pancreas: Indistinct appearance of the pancreatic tail with mild surrounding stranding. No fluid collections. Spleen: Normal in size without focal abnormality. Adrenals/Urinary Tract: Adrenal glands are unremarkable. Kidneys are normal, without renal calculi, focal lesion, or hydronephrosis. Bladder is unremarkable. Stomach/Bowel: Stomach is within normal limits. Appendix appears normal. No evidence of bowel wall thickening, distention, or inflammatory changes. Vascular/Lymphatic: Mild aortic atherosclerosis. No aneurysm. No suspicious lymph nodes. Reproductive: Prostate is unremarkable. Other: Negative for pelvic effusion or free air. Small fat containing umbilical hernia Musculoskeletal: No acute or significant  osseous findings.  IMPRESSION: 1. Findings consistent with acute pancreatitis of the pancreatic tail. No organized fluid collections allowing for absence of contrast 2. Negative for hydronephrosis or ureteral stone Electronically Signed   By: Donavan Foil M.D.   On: 10/16/2021 00:44   CUP PACEART REMOTE DEVICE CHECK  Result Date: 09/21/2021 Scheduled remote reviewed. Normal device function.  Next remote 91 days. LA      Today   Subjective    Troy Rush today has no headache,no chest abdominal pain,no new weakness tingling or numbness, feels much better wants to go home today.     Objective   Blood pressure (!) 178/81, pulse 74, temperature 98 F (36.7 C), temperature source Oral, resp. rate 16, height 5\' 10"  (1.778 m), weight 104.5 kg, SpO2 100 %.   Intake/Output Summary (Last 24 hours) at 10/19/2021 1134 Last data filed at 10/19/2021 1000 Gross per 24 hour  Intake 840 ml  Output --  Net 840 ml    Exam  Awake Alert, No new F.N deficits,    [place].AT,PERRAL Supple Neck,   Symmetrical Chest wall movement, Good air movement bilaterally, CTAB RRR,No Gallops,   +ve B.Sounds, Abd Soft, Non tender,  No Cyanosis, Clubbing or edema    Data Review   Recent Labs  Lab 10/15/21 2339 10/16/21 0040 10/17/21 0144 10/18/21 0205 10/19/21 0208  WBC 6.7  --  4.9 4.1 3.7*  HGB 12.3* 13.6 11.5* 11.1* 10.9*  HCT 38.2* 40.0 37.0* 35.3* 34.3*  PLT 203  --  176 178 185  MCV 88.4  --  93.2 91.7 90.5  MCH 28.5  --  29.0 28.8 28.8  MCHC 32.2  --  31.1 31.4 31.8  RDW 12.8  --  13.2 13.2 13.0  LYMPHSABS  --   --  1.4 1.3 1.3  MONOABS  --   --  0.7 0.6 0.6  EOSABS  --   --  0.1 0.2 0.1  BASOSABS  --   --  0.0 0.0 0.0    Recent Labs  Lab 10/15/21 2339 10/16/21 0040 10/16/21 0225 10/17/21 0144 10/18/21 0205 10/19/21 0208  NA 138 139  --  139 136 137  K 4.7 4.6  --  4.0 4.2 3.9  CL 102 105  --  102 100 103  CO2 22  --   --  26 25 25   GLUCOSE 197* 186*  --  115* 219* 141*  BUN 39* 40*  --  42* 36* 34*   CREATININE 2.87* 3.00*  --  3.12* 2.66* 2.51*  CALCIUM 9.6  --   --  9.2 9.1 9.4  AST 30  --   --  20 21 21   ALT 28  --   --  22 20 19   ALKPHOS 50  --   --  47 45 46  BILITOT 1.0  --   --  0.8 0.9 0.7  ALBUMIN 4.0  --   --  3.5 3.3* 3.3*  MG  --   --   --  2.7* 2.8* 2.3  LATICACIDVEN  --   --  1.1  --   --   --   BNP  --   --   --  80.8 51.3 107.2*    Total Time in preparing paper work, data evaluation and todays exam - 35 minutes  Lala Lund M.D on 10/19/2021 at 11:34 AM  [facility]

## 2021-10-19 NOTE — Plan of Care (Signed)
No acute events overnight. Problem: Education: Goal: Ability to describe self-care measures that may prevent or decrease complications (Diabetes Survival Skills Education) will improve Outcome: Progressing Goal: Individualized Educational Video(s) Outcome: Progressing   Problem: Coping: Goal: Ability to adjust to condition or change in health will improve Outcome: Progressing   Problem: Fluid Volume: Goal: Ability to maintain a balanced intake and output will improve Outcome: Progressing   Problem: Health Behavior/Discharge Planning: Goal: Ability to identify and utilize available resources and services will improve Outcome: Progressing Goal: Ability to manage health-related needs will improve Outcome: Progressing   Problem: Metabolic: Goal: Ability to maintain appropriate glucose levels will improve Outcome: Progressing   Problem: Nutritional: Goal: Maintenance of adequate nutrition will improve Outcome: Progressing Goal: Progress toward achieving an optimal weight will improve Outcome: Progressing   Problem: Skin Integrity: Goal: Risk for impaired skin integrity will decrease Outcome: Progressing   Problem: Tissue Perfusion: Goal: Adequacy of tissue perfusion will improve Outcome: Progressing   Problem: Education: Goal: Knowledge of General Education information will improve Description: Including pain rating scale, medication(s)/side effects and non-pharmacologic comfort measures Outcome: Progressing   Problem: Health Behavior/Discharge Planning: Goal: Ability to manage health-related needs will improve Outcome: Progressing   Problem: Clinical Measurements: Goal: Ability to maintain clinical measurements within normal limits will improve Outcome: Progressing Goal: Will remain free from infection Outcome: Progressing Goal: Diagnostic test results will improve Outcome: Progressing Goal: Respiratory complications will improve Outcome: Progressing Goal:  Cardiovascular complication will be avoided Outcome: Progressing   Problem: Activity: Goal: Risk for activity intolerance will decrease Outcome: Progressing   Problem: Nutrition: Goal: Adequate nutrition will be maintained Outcome: Progressing   Problem: Coping: Goal: Level of anxiety will decrease Outcome: Progressing   Problem: Elimination: Goal: Will not experience complications related to bowel motility Outcome: Progressing Goal: Will not experience complications related to urinary retention Outcome: Progressing   Problem: Pain Managment: Goal: General experience of comfort will improve Outcome: Progressing   Problem: Safety: Goal: Ability to remain free from injury will improve Outcome: Progressing   Problem: Skin Integrity: Goal: Risk for impaired skin integrity will decrease Outcome: Progressing   

## 2021-10-19 NOTE — Discharge Instructions (Addendum)
Follow with Primary MD Bo Merino I, NP (Inactive) in 7 days   Get CBC, CMP, 2 view Chest X ray -  checked next visit within 1 week by Primary MD    Activity: As tolerated with Full fall precautions use walker/cane & assistance as needed  Disposition Home    Diet: Heart Healthy  Low Carb, check CBGs q. ACH S.  Special Instructions: If you have smoked or chewed Tobacco  in the last 2 yrs please stop smoking, stop any regular Alcohol  and or any Recreational drug use.  On your next visit with your primary care physician please Get Medicines reviewed and adjusted.  Please request your Prim.MD to go over all Hospital Tests and Procedure/Radiological results at the follow up, please get all Hospital records sent to your Prim MD by signing hospital release before you go home.  If you experience worsening of your admission symptoms, develop shortness of breath, life threatening emergency, suicidal or homicidal thoughts you must seek medical attention immediately by calling 911 or calling your MD immediately  if symptoms less severe.  You Must read complete instructions/literature along with all the possible adverse reactions/side effects for all the Medicines you take and that have been prescribed to you. Take any new Medicines after you have completely understood and accpet all the possible adverse reactions/side effects.

## 2021-10-19 NOTE — Progress Notes (Signed)
Heart and Vascular Care Navigation  10/19/2021  Troy Rush [birth year] [id number]  Reason for Referral: housing insecurity   CSW engaged with patient by email for follow up visit for Heart and Vascular Care Coordination.                                                                                                   Assessment:    Pt had attempted to get assistance through options CSW sent to patient but they either didn't have funds or could only offer minimal assistance.  CSW able to refer patient for Patient Care Fund assistance to help with rent that he couldn't cover.  Rent was behind 2 months after roommate moved out and patient had full rental amount to cover.  Patient working on getting new roommate so he can have assistance again with covering housing costs.                                  HRT/VAS Care Coordination     Outpatient Care Team [facility] Paramedic Name: [name]- [phone]   Social Worker Name: Tammy Sours- [facility]- [phone]   Living arrangements for the past 2 months Apartment   Lives with: Roommate   Patient Current Insurance Coverage Medicaid; Traditional Medicare   Patient Has Concern With Paying Medical Bills No   Does Patient Have Prescription Coverage? Yes   Home Assistive Devices/Equipment CPAP; Eyeglasses; Shower chair with back   DME Agency NA   [place] History:                                                                             SDOH Screenings   Alcohol Screen: Not on file  Depression (PHQ2-9): Low Risk  (07/28/2021)   Depression (PHQ2-9)    PHQ-2 Score: 0  Financial Resource Strain: High Risk (10/19/2021)   Overall Financial Resource Strain (CARDIA)    Difficulty of Paying Living Expenses: Hard  Food Insecurity: No Food Insecurity (02/17/2020)   Hunger Vital Sign    Worried About Running Out of Food in the Last Year: Never true    Ran Out of Food in the  Last Year: Never true  Housing: Not on file  Physical Activity: Not on file  Social Connections: Not on file  Stress: Not on file  Tobacco Use: Low Risk  (10/16/2021)   Patient History    Smoking Tobacco Use: Never    Smokeless Tobacco Use: Never    Passive Exposure: Not on file  Transportation Needs: No Transportation Needs (02/13/2020)   PRAPARE - Transportation    Lack of Transportation (Medical): No    Lack of  Transportation (Non-Medical): No    SDOH Interventions: Financial Resources:  Financial Strain Interventions: Other (Comment) (Patient Care fund referral)   Food Insecurity:  None reported  Housing Insecurity:  Agricultural engineer started for eviction  Transportation:   None reported    Follow-up plan:    CSW will help follow up on check request and keep pt updated on status.  Jorge Ny, LCSW Clinical Social Worker [facility] Desk#: [phone] Cell#: [phone]

## 2021-10-27 ENCOUNTER — Other Ambulatory Visit (HOSPITAL_COMMUNITY): Payer: Self-pay

## 2021-10-27 NOTE — Progress Notes (Addendum)
Paramedicine Encounter    Patient ID: Troy Rush, male    DOB: [birth year], 51 y.o.   MRN: [id number]  Pt reports he is doing well. He has been out walking around this morning.  He denies dizziness, no c/p.  Stomach feels ok.  Bubble pack of meds checked.  He has not been cutting his lasix in half. He thought it was fixed in pill packs but it was not sent in correctly. He will do 1/2 from now on.   His next kidney doc appoint is in [place].   He has missed a few PM doses of meds due to him falling asleep.  He is week 3 of his bubble pack.  He reports using his cardiomems.   Still no CPAP--waiting on new machine--never received it.  Will f/u.   Eliezer Lofts was going to assist with rent--suppose to have mailed check-will f/u on that as well.   Refills needed: Repatha  CBG EM3  no insulin today yet -192 BP 140/90   Pulse 78   Resp 16   Wt 236 lb (107 kg)   SpO2 98%   BMI 33.86 kg/m  Weight yesterday-? Last visit weight-237  Patient Care Team: Bo Merino I, NP as PCP - General (Nurse Practitioner) Larey Dresser, MD as PCP - Cardiology (Cardiology) Conrad Elmwood, NP as Nurse Practitioner (Cardiology) Larey Dresser, MD as Consulting Physician (Cardiology) Jorge Ny, LCSW as Social Worker (Licensed Clinical Social Worker)  Patient Active Problem List   Diagnosis Date Noted   Acute pancreatitis 10/16/2021   COVID-19 virus infection 08/19/2021   Acute on chronic systolic CHF (congestive heart failure) ([place]) 07/18/2021   Acute respiratory failure with hypoxia ([place]) 07/18/2021   Intractable nausea and vomiting 07/17/2021   Chest pain 07/17/2021   Type 2 diabetes mellitus with chronic kidney disease, with long-term current use of insulin ([place]) 07/17/2021   Depression 07/17/2021   Paroxysmal atrial fibrillation ([place]) 07/17/2021   Hypertensive urgency 07/17/2021   Alcohol abuse 07/17/2021   Subcortical infarction ([place]) 10/03/2020   Cerebral thrombosis with cerebral  infarction 10/01/2020   Unilateral weakness 09/30/2020   History of atrial fibrillation 09/30/2020   History of DVT (deep vein thrombosis) 09/30/2020   Chronic kidney disease, stage 3b ([place]) 07/08/2020   Acute CHF (congestive heart failure) ([place]) 07/07/2020   CKD (chronic kidney disease), stage IV ([place]) 07/07/2020   ICD (implantable cardioverter-defibrillator) in place 05/14/2020   Pulmonary embolism ([place]) 02/05/2020   AKI (acute kidney injury) ([place])    Chronic combined systolic and diastolic heart failure ([place])    Atrial fibrillation with RVR ([place]) 02/01/2020   NSVT (nonsustained ventricular tachycardia) ([place]) 02/01/2020   Acute GI bleeding 02/01/2020   DKA (diabetic ketoacidosis) ([place]) 02/01/2020   Bilateral deafness 08/15/2019   Type 2 diabetes mellitus with complication, with long-term current use of insulin ([place]) 08/15/2019   Hemoglobin A1C between 7% and 9% indicating borderline diabetic control ([place]) 08/15/2019   Insomnia 08/15/2019   Abdominal pain 11/14/2018   Ischemic cardiomyopathy 10/14/2016   NICM (nonischemic cardiomyopathy) ([place]) 46/50/3546   Chronic systolic CHF (congestive heart failure) ([place]) 05/30/2016   HTN (hypertension) 05/30/2016   Deaf 05/30/2016   Hyperkalemia 05/30/2016   Major depressive disorder, recurrent episode ([place]) 03/21/2016   Bronchitis 01/21/2015   Dyslipidemia 01/21/2015   Vitamin D deficiency 12/19/2011   Personal history of other infectious and parasitic diseases 10/25/2011   Anxiety disorder 05/12/2011   Hyperlipidemia 05/12/2011   Hypertriglyceridemia 05/12/2011  Obstructive sleep apnea 05/12/2011    Current Outpatient Medications:    amLODipine (NORVASC) 5 MG tablet, Take 5 mg by mouth daily., Disp: , Rfl:    apixaban (ELIQUIS) 5 MG TABS tablet, TAKE 1 TABLET(5 MG) BY MOUTH TWICE DAILY (Patient taking differently: Take 5 mg by mouth 2 (two) times daily.), Disp: 60 tablet, Rfl: 11   atorvastatin (LIPITOR) 80 MG tablet, Take 1 tablet  (80 mg total) by mouth at bedtime., Disp: 30 tablet, Rfl: 11   carvedilol (COREG) 25 MG tablet, TAKE 1 TABLET (25 MG TOTAL) BY MOUTH 2 (TWO) TIMES DAILY. (Patient taking differently: Take 25 mg by mouth in the morning and at bedtime.), Disp: 60 tablet, Rfl: 0   cholecalciferol (VITAMIN D3) 25 MCG (1000 UNIT) tablet, Take 1,000 Units by mouth daily., Disp: , Rfl:    dapagliflozin propanediol (FARXIGA) 10 MG TABS tablet, Take 1 tablet (10 mg total) by mouth daily before breakfast., Disp: 90 tablet, Rfl: 3   Evolocumab (REPATHA SURECLICK) 573 MG/ML SOAJ, Inject 140 mg into the skin every 14 (fourteen) days., Disp: 2 mL, Rfl: 11   fenofibrate 54 MG tablet, Take 1 tablet (54 mg total) by mouth daily., Disp: 30 tablet, Rfl: 6   furosemide (LASIX) 80 MG tablet, Take 1 tablet (80 mg total) by mouth every morning AND 0.5 tablets (40 mg total) every evening. (Patient taking differently: No sig reported), Disp: 60 tablet, Rfl: 3   hydrALAZINE (APRESOLINE) 50 MG tablet, Take 1 tablet (50 mg total) by mouth 3 (three) times daily., Disp: 260 tablet, Rfl: 3   insulin glargine (LANTUS SOLOSTAR) 100 UNIT/ML Solostar Pen, Inject 10 Units into the skin at bedtime., Disp: 9 mL, Rfl: 3   Insulin Pen Needle (PEN NEEDLES) 31G X 6 MM MISC, 1 pen by Does not apply route daily., Disp: 100 each, Rfl: 11   isosorbide mononitrate (IMDUR) 60 MG 24 hr tablet, Take 1 tablet (60 mg total) by mouth daily., Disp: 90 tablet, Rfl: 3   LEVEMIR FLEXPEN 100 UNIT/ML FlexPen, Inject 35 Units into the skin at bedtime., Disp: , Rfl:    meclizine (ANTIVERT) 25 MG tablet, Take 25 mg by mouth 3 (three) times daily as needed for dizziness., Disp: , Rfl:    ondansetron (ZOFRAN) 4 MG tablet, Take 1 tablet (4 mg total) by mouth every 8 (eight) hours as needed for nausea or vomiting., Disp: 30 tablet, Rfl: 0   pantoprazole (PROTONIX) 40 MG tablet, Take 40 mg by mouth 2 (two) times daily., Disp: , Rfl:    VASCEPA 1 g capsule, Take 2 capsules (2 g total)  by mouth 2 (two) times daily., Disp: 120 capsule, Rfl: 5   Continuous Blood Gluc Sensor (FREESTYLE LIBRE 3 SENSOR) MISC, 1 application by Does not apply route every 14 (fourteen) days. Place 1 sensor on the skin every 14 days. Use to check glucose continuously (Patient not taking: Reported on 09/16/2021), Disp: 2 each, Rfl: 5   sertraline (ZOLOFT) 50 MG tablet, Take 1 tablet (50 mg total) by mouth daily., Disp: 90 tablet, Rfl: 1 Allergies  Allergen Reactions   Lisinopril Cough      Social History   Socioeconomic History   Marital status: Legally Separated    Spouse name: Not on file   Number of children: Not on file   Years of education: Not on file   Highest education level: Not on file  Occupational History   Not on file  Tobacco Use   Smoking status: Never  Smokeless tobacco: Never  Vaping Use   Vaping Use: Never used  Substance and Sexual Activity   Alcohol use: Not Currently    Comment: occ   Drug use: No   Sexual activity: Yes    Birth control/protection: None  Other Topics Concern   Not on file  Social History Narrative   Not on file   Social Determinants of Health   Financial Resource Strain: High Risk (10/19/2021)   Overall Financial Resource Strain (CARDIA)    Difficulty of Paying Living Expenses: Hard  Food Insecurity: No Food Insecurity (02/17/2020)   Hunger Vital Sign    Worried About Running Out of Food in the Last Year: Never true    Ran Out of Food in the Last Year: Never true  Transportation Needs: No Transportation Needs (02/13/2020)   PRAPARE - Hydrologist (Medical): No    Lack of Transportation (Non-Medical): No  Physical Activity: Not on file  Stress: Not on file  Social Connections: Not on file  Intimate Partner Violence: Not on file    Physical Exam      Future Appointments  Date Time Provider [place]  12/02/2021 11:40 AM Larey Dresser, MD [facility] None  12/20/2021  8:35 AM [facility] DEVICE  REMOTES [id number]       [name], [facility] Paramedic  10/27/21

## 2021-11-10 ENCOUNTER — Telehealth (HOSPITAL_COMMUNITY): Payer: Self-pay

## 2021-11-10 NOTE — Telephone Encounter (Signed)
Troy Rush reached out to [facility] needing refills on bubble packs. I reached out to [name] at [employer] and made him aware of him needing them filled and delivered as soon as possible.   [name] is aware [facility] is working on same.  [name], [place] 11/10/2021

## 2021-11-15 ENCOUNTER — Ambulatory Visit: Payer: Self-pay | Admitting: Licensed Clinical Social Worker

## 2021-11-15 NOTE — Patient Outreach (Signed)
  Care Coordination   Initial Visit Note   11/15/2021 Name: Troy Rush MRN: [id number] DOB: [birth year]  Troy Rush is a 51 y.o. year old male who sees [place], Jake Church I, NP for primary care. I spoke with daughter of COE ANGELOS by phone today. Daughter is Chief of Staff.  What matters to the patients health and wellness today?  Client needs to make transport arrangement to attend scheduled medical appointments for client    Goals Addressed             This Visit's Progress    patient will make transport arrangements as needed to attend scheduled medical appointments       Care Coordination Interventions: Discussed with daughter of client client transport needs. Spoke of transport resources Active listening / Reflection utilized  Engineer, petroleum Provided  Spoke of client attending medical appointments. Daughter said he is inconsistent in going to medical appointments. Daughter was not sure what reason was for his missing appointments occasionally Discussed Care coodination support with RN and LCSW Encouraged daughter to call LCSW as needed related to needs of client      SDOH assessments and interventions completed:  Yes  SDOH Interventions Today    Flowsheet Row Most Recent Value  SDOH Interventions   Transportation Interventions Other (Comment)  [discussed with daughter of client transport options for client]  Depression Interventions/Treatment  Medication        Care Coordination Interventions Activated:  Yes  Care Coordination Interventions:  Yes, provided   Follow up plan: Follow up call scheduled for September 6,2023 at 11:00 AM    Encounter Outcome:  Pt. Visit Completed

## 2021-11-15 NOTE — Patient Instructions (Signed)
Visit Information  Thank you for taking time to visit with me today. Please don't hesitate to contact me if I can be of assistance to you before our next scheduled telephone appointment.  Following are the goals we discussed today:   Our next appointment is by telephone on 12/15/21 at 11:00 AM   Please call the care guide team at [phone] if you need to cancel or reschedule your appointment.   If you are experiencing a Mental Health or [place] or need someone to talk to, please go to [facility] [place] [phone])   Following is a copy of your full plan of care:   Care Coordination Interventions: Discussed with daughter of client client transport needs. Spoke of transport resources Active listening / Reflection utilized  Engineer, petroleum Provided  Spoke of client attending medical appointments. Daughter said he is inconsistent in going to medical appointments. Daughter was not sure what reason was for his missing appointments occasionally Discussed Care coodination support with RN and LCSW Encouraged daughter to call LCSW as needed related to needs of client   Troy Rush was given information about Care Management services by the embedded care coordination team including:  Care Management services include personalized support from designated clinical staff supervised by his physician, including individualized plan of care and coordination with other care providers 24/7 contact phone numbers for assistance for urgent and routine care needs. The patient may stop CCM services at any time (effective at the end of the month) by phone call to the office staff.  Patient agreed to services and verbal consent obtained.    Follow Up Call:  LCSW to call client or his daughter on 12/15/21 at 11:00 AM  Troy Rush.Breuna Loveall MSW, [place] Holiday representative [facility] Care Management [phone]

## 2021-11-17 ENCOUNTER — Other Ambulatory Visit: Payer: Self-pay | Admitting: Nurse Practitioner

## 2021-11-17 ENCOUNTER — Telehealth (HOSPITAL_COMMUNITY): Payer: Self-pay | Admitting: Cardiology

## 2021-11-17 DIAGNOSIS — E118 Type 2 diabetes mellitus with unspecified complications: Secondary | ICD-10-CM

## 2021-11-17 MED ORDER — ISOSORBIDE MONONITRATE ER 60 MG PO TB24: 60.0000 mg | ORAL_TABLET | Freq: Every day | ORAL | 6 refills | Status: AC

## 2021-11-17 MED ORDER — CARVEDILOL 25 MG PO TABS: 25.0000 mg | ORAL_TABLET | Freq: Two times a day (BID) | ORAL | 6 refills | Status: AC

## 2021-11-17 MED ORDER — AMLODIPINE BESYLATE 5 MG PO TABS: 5.0000 mg | ORAL_TABLET | Freq: Every day | ORAL | 6 refills | Status: DC

## 2021-11-17 MED ORDER — ATORVASTATIN CALCIUM 80 MG PO TABS: 80.0000 mg | ORAL_TABLET | Freq: Every day | ORAL | 6 refills | Status: AC

## 2021-11-17 MED ORDER — VASCEPA 1 G PO CAPS: 2.0000 g | ORAL_CAPSULE | Freq: Two times a day (BID) | ORAL | 6 refills | Status: AC

## 2021-11-17 MED ORDER — APIXABAN 5 MG PO TABS: ORAL_TABLET | ORAL | 6 refills | Status: AC

## 2021-11-17 MED ORDER — DAPAGLIFLOZIN PROPANEDIOL 10 MG PO TABS: 10.0000 mg | ORAL_TABLET | Freq: Every day | ORAL | 6 refills | Status: AC

## 2021-11-17 NOTE — Telephone Encounter (Signed)
-----   Message from [name], EMT sent at 11/16/2021  5:55 PM EDT ----- Regarding: refills [place],   Per [place] they have not heard back from the refill request on the following:  Amlodipine Farxiga Atorvastatin Eliquis Vascepa Isosorbide Carvedilol   Can you send these over please?  [name], [place] 11/16/2021

## 2021-11-24 ENCOUNTER — Telehealth (HOSPITAL_COMMUNITY): Payer: Self-pay | Admitting: Licensed Clinical Social Worker

## 2021-11-24 NOTE — Telephone Encounter (Signed)
HF Paramedicine Team Based Care Meeting  HF MD- NA  HF NP - [place] NP-C   [facility] admit within the last 30 days for heart failure? no   Medications concerns? On bubble packs but does still have some questions  Transportation issues ? Uses uber or payor source transport  Education needs? Not at this time  Eligible for discharge? Likely ready for DC- will discuss at next home visit  Troy Rush, [place] Worker [facility] Desk#: [phone] Cell#: [phone]

## 2021-12-01 ENCOUNTER — Other Ambulatory Visit (HOSPITAL_COMMUNITY): Payer: Self-pay

## 2021-12-01 NOTE — Progress Notes (Signed)
Paramedicine Encounter    Patient ID: Troy Rush, male    DOB: [birth year], 51 y.o.   MRN: [id number]  Pt reports he is doing ok. He gets dizzy at times.  He advised he is moving to [place] this wknd for better job and pay. He has 2.5 wks left of his meds. Needs repatha sent out-contacted pharmacy to get that refilled.  Pharmacy is able to mail out meds a few months until he gets meds and insurance and all figured out.  Also will ask providers if they know of and recommend any medical facilities for him to continue his care.  He just took his afternoon dose of meds during our visit.   He actually has appointment tomor in clinic.  Weight is down.   Mailing address: [place], [place] [address]  BP (!) 142/90   Pulse 80   Resp 18   Wt 228 lb (103.4 kg)   SpO2 98%   BMI 32.71 kg/m  Weight yesterday-? Last visit weight-234   Patient Care Team: Bo Merino I, NP as PCP - General (Nurse Practitioner) Larey Dresser, MD as PCP - Cardiology (Cardiology) Conrad Corunna, NP as Nurse Practitioner (Cardiology) Larey Dresser, MD as Consulting Physician (Cardiology) Jorge Ny, LCSW as Social Worker (Licensed Clinical Social Worker) Shea Evans, Derrek Gu as [place] (Licensed Clinical Social Worker)  Patient Active Problem List   Diagnosis Date Noted   Acute pancreatitis 10/16/2021   COVID-19 virus infection 08/19/2021   Acute on chronic systolic CHF (congestive heart failure) ([place]) 07/18/2021   Acute respiratory failure with hypoxia ([place]) 07/18/2021   Intractable nausea and vomiting 07/17/2021   Chest pain 07/17/2021   Type 2 diabetes mellitus with chronic kidney disease, with long-term current use of insulin ([place]) 07/17/2021   Depression 07/17/2021   Paroxysmal atrial fibrillation ([place]) 07/17/2021   Hypertensive urgency 07/17/2021   Alcohol abuse 07/17/2021   Subcortical infarction ([place]) 10/03/2020   Cerebral  thrombosis with cerebral infarction 10/01/2020   Unilateral weakness 09/30/2020   History of atrial fibrillation 09/30/2020   History of DVT (deep vein thrombosis) 09/30/2020   Chronic kidney disease, stage 3b ([place]) 07/08/2020   Acute CHF (congestive heart failure) ([place]) 07/07/2020   CKD (chronic kidney disease), stage IV ([place]) 07/07/2020   ICD (implantable cardioverter-defibrillator) in place 05/14/2020   Pulmonary embolism ([place]) 02/05/2020   AKI (acute kidney injury) ([place])    Chronic combined systolic and diastolic heart failure ([place])    Atrial fibrillation with RVR ([place]) 02/01/2020   NSVT (nonsustained ventricular tachycardia) ([place]) 02/01/2020   Acute GI bleeding 02/01/2020   DKA (diabetic ketoacidosis) ([place]) 02/01/2020   Bilateral deafness 08/15/2019   Type 2 diabetes mellitus with complication, with long-term current use of insulin ([place]) 08/15/2019   Hemoglobin A1C between 7% and 9% indicating borderline diabetic control ([place]) 08/15/2019   Insomnia 08/15/2019   Abdominal pain 11/14/2018   Ischemic cardiomyopathy 10/14/2016   NICM (nonischemic cardiomyopathy) ([place]) 98/92/1194   Chronic systolic CHF (congestive heart failure) ([place]) 05/30/2016   HTN (hypertension) 05/30/2016   Deaf 05/30/2016   Hyperkalemia 05/30/2016   Major depressive disorder, recurrent episode ([place]) 03/21/2016   Bronchitis 01/21/2015   Dyslipidemia 01/21/2015   Vitamin D deficiency 12/19/2011   Personal history of other infectious and parasitic diseases 10/25/2011   Anxiety disorder 05/12/2011   Hyperlipidemia 05/12/2011   Hypertriglyceridemia 05/12/2011   Obstructive sleep apnea 05/12/2011    Current Outpatient Medications:  amLODipine (NORVASC) 5 MG tablet, Take 1 tablet (5 mg total) by mouth daily., Disp: 30 tablet, Rfl: 6   apixaban (ELIQUIS) 5 MG TABS tablet, TAKE 1 TABLET(5 MG) BY MOUTH TWICE DAILY, Disp: 60 tablet, Rfl: 6   atorvastatin (LIPITOR) 80 MG tablet, Take 1 tablet (80 mg total) by  mouth at bedtime., Disp: 30 tablet, Rfl: 6   carvedilol (COREG) 25 MG tablet, Take 1 tablet (25 mg total) by mouth 2 (two) times daily., Disp: 60 tablet, Rfl: 6   cholecalciferol (VITAMIN D3) 25 MCG (1000 UNIT) tablet, Take 1,000 Units by mouth daily., Disp: , Rfl:    dapagliflozin propanediol (FARXIGA) 10 MG TABS tablet, Take 1 tablet (10 mg total) by mouth daily before breakfast., Disp: 30 tablet, Rfl: 6   Evolocumab (REPATHA SURECLICK) 440 MG/ML SOAJ, Inject 140 mg into the skin every 14 (fourteen) days., Disp: 2 mL, Rfl: 11   fenofibrate 54 MG tablet, Take 1 tablet (54 mg total) by mouth daily., Disp: 30 tablet, Rfl: 6   furosemide (LASIX) 80 MG tablet, Take 1 tablet (80 mg total) by mouth every morning AND 0.5 tablets (40 mg total) every evening. (Patient taking differently: No sig reported), Disp: 60 tablet, Rfl: 3   hydrALAZINE (APRESOLINE) 50 MG tablet, Take 1 tablet (50 mg total) by mouth 3 (three) times daily., Disp: 260 tablet, Rfl: 3   insulin glargine (LANTUS SOLOSTAR) 100 UNIT/ML Solostar Pen, Inject 10 Units into the skin at bedtime., Disp: 9 mL, Rfl: 3   Insulin Pen Needle (PEN NEEDLES) 31G X 6 MM MISC, 1 pen by Does not apply route daily., Disp: 100 each, Rfl: 11   isosorbide mononitrate (IMDUR) 60 MG 24 hr tablet, Take 1 tablet (60 mg total) by mouth daily., Disp: 30 tablet, Rfl: 6   LEVEMIR FLEXPEN 100 UNIT/ML FlexPen, Inject 35 Units into the skin at bedtime., Disp: , Rfl:    meclizine (ANTIVERT) 25 MG tablet, Take 25 mg by mouth 3 (three) times daily as needed for dizziness., Disp: , Rfl:    ondansetron (ZOFRAN) 4 MG tablet, Take 1 tablet (4 mg total) by mouth every 8 (eight) hours as needed for nausea or vomiting., Disp: 30 tablet, Rfl: 0   pantoprazole (PROTONIX) 40 MG tablet, TAKE 1 TABLET (40 MG TOTAL) BY MOUTH 2 (TWO) TIMES DAILY (AM+BEDTIME), Disp: 180 tablet, Rfl: 0   sertraline (ZOLOFT) 50 MG tablet, TAKE 1 TABLET (50 MG TOTAL) BY MOUTH DAILY (AM), Disp: 90 tablet, Rfl:  1   VASCEPA 1 g capsule, Take 2 capsules (2 g total) by mouth 2 (two) times daily., Disp: 120 capsule, Rfl: 6   Continuous Blood Gluc Sensor (FREESTYLE LIBRE 3 SENSOR) MISC, 1 application by Does not apply route every 14 (fourteen) days. Place 1 sensor on the skin every 14 days. Use to check glucose continuously (Patient not taking: Reported on 09/16/2021), Disp: 2 each, Rfl: 5 Allergies  Allergen Reactions   Lisinopril Cough      Social History   Socioeconomic History   Marital status: Legally Separated    Spouse name: Not on file   Number of children: Not on file   Years of education: Not on file   Highest education level: Not on file  Occupational History   Not on file  Tobacco Use   Smoking status: Never   Smokeless tobacco: Never  Vaping Use   Vaping Use: Never used  Substance and Sexual Activity   Alcohol use: Not Currently    Comment:  occ   Drug use: No   Sexual activity: Yes    Birth control/protection: None  Other Topics Concern   Not on file  Social History Narrative   Not on file   Social Determinants of Health   Financial Resource Strain: High Risk (10/19/2021)   Overall Financial Resource Strain (CARDIA)    Difficulty of Paying Living Expenses: Hard  Food Insecurity: No Food Insecurity (02/17/2020)   Hunger Vital Sign    Worried About Running Out of Food in the Last Year: Never true    Ran Out of Food in the Last Year: Never true  Transportation Needs: Unmet Transportation Needs (11/15/2021)   PRAPARE - Hydrologist (Medical): Yes    Lack of Transportation (Non-Medical): No  Physical Activity: Not on file  Stress: Not on file  Social Connections: Not on file  Intimate Partner Violence: Not on file    Physical Exam      Future Appointments  Date Time Provider [place]  12/02/2021 11:40 AM Larey Dresser, MD [facility] None  12/15/2021 11:00 AM Katha Cabal, LCSW [facility] None  12/20/2021  8:35 AM [facility]  DEVICE REMOTES [facility]       [name], EMT-Paramedic [address] Paramedic  12/01/21

## 2021-12-02 ENCOUNTER — Encounter (HOSPITAL_COMMUNITY): Payer: Self-pay | Admitting: Cardiology

## 2021-12-02 ENCOUNTER — Ambulatory Visit (HOSPITAL_COMMUNITY)
Admission: RE | Admit: 2021-12-02 | Discharge: 2021-12-02 | Disposition: A | Discharge status: Home / Self Care | Payer: Medicare Other | Source: Ambulatory Visit | Attending: Cardiology | Admitting: Cardiology

## 2021-12-02 VITALS — BP 98/60 | HR 71 | Wt 230.0 lb

## 2021-12-02 DIAGNOSIS — I5022 Chronic systolic (congestive) heart failure: Secondary | ICD-10-CM | POA: Insufficient documentation

## 2021-12-02 LAB — CBC
HCT: 33.6 % — ABNORMAL LOW (ref 39.0–52.0)
Hemoglobin: 10.8 g/dL — ABNORMAL LOW (ref 13.0–17.0)
MCH: 29.1 pg (ref 26.0–34.0)
MCHC: 32.1 g/dL (ref 30.0–36.0)
MCV: 90.6 fL (ref 80.0–100.0)
Platelets: 224 10*3/uL (ref 150–400)
RBC: 3.71 MIL/uL — ABNORMAL LOW (ref 4.22–5.81)
RDW: 12.2 % (ref 11.5–15.5)
WBC: 4.3 10*3/uL (ref 4.0–10.5)
nRBC: 0 % (ref 0.0–0.2)

## 2021-12-02 LAB — BRAIN NATRIURETIC PEPTIDE: B Natriuretic Peptide: 160.7 pg/mL — ABNORMAL HIGH (ref 0.0–100.0)

## 2021-12-02 NOTE — Patient Instructions (Signed)
Stop Amlodipine  Labs done today, your results will be available in MyChart, we will contact you for abnormal readings.  We will refer you to the [facility] at the [facility].  Your physician recommends that you schedule a follow-up appointment in: as needed.  If you have any questions or concerns before your next appointment please send [place] a message through [place] or call our office at [phone].    TO LEAVE A MESSAGE FOR THE NURSE SELECT OPTION 2, PLEASE LEAVE A MESSAGE INCLUDING: YOUR NAME DATE OF BIRTH CALL BACK NUMBER REASON FOR CALL**this is important as we prioritize the call backs  YOU WILL RECEIVE A CALL BACK THE SAME DAY AS LONG AS YOU CALL BEFORE 4:00 PM  At the [facility], you and your health needs are our priority. As part of our continuing mission to provide you with exceptional heart care, we have created designated Provider Care Teams. These Care Teams include your primary Cardiologist (physician) and Advanced Practice Providers (APPs- Physician Assistants and Nurse Practitioners) who all work together to provide you with the care you need, when you need it.   You may see any of the following providers on your designated Care Team at your next follow up: Dr Glori Bickers Dr Haynes Kerns, NP Lyda Jester, [facility] [place], [place] Audry Riles, PharmD   Please be sure to bring in all your medications bottles to every appointment.

## 2021-12-03 NOTE — Progress Notes (Signed)
Advanced Heart Failure Clinic Note   PCP: Kathe Becton, FNP HF Cardiology: Dr. Aundra Dubin  Reason for Visit: 4 week f/u chronic systolic heart failure   [name] is a 51 y.o. with a history of with deafness, HTN, DM, and chronic systolic HF.   Patient had no known cardiac problems until 10/16.  At that time, he developed exertional dyspnea and was admitted to a hospital in [place], [place] with acute systolic CHF.  Echo 10/16 showed EF 15-20%.  Cardiac cath showed no significant coronary disease.  He was diuresed and started on cardiac meds. Subsequently, he moved to [facility].  Now working in a [employer].  Repeat echo in 5/18 showed persistently low EF, 25-30%.  He had [place] placed.   He was admitted briefly with CHF exacerbation in 8/20.    Echo in 9/20 showed EF 30-35% with diffuse hypokinesis.   Ultrasound in 4/21 showed right leg DVT and CT showed a PE.  No obvious trigger (no long trip, no surgery, no prolonged immobility).  He was started on apixaban.   He quit taking his meds in 8/21 and was drinking heavily due to depression, later restarted meds.  In 10/21, he was admitted with DKA and atrial fibrillation/RVR (1st known episode).  He converted to NSR spontaneously.  [name] was stopped due to AKI.  He had melena/hematochezia and EGD showed gastritis/esophagitis.  Apixaban was stopped.  He went home but then was re-admitted later in 10/21 with bilateral PEs.   Echo in 10/21 showed EF 45%, mild global hypokinesis, moderate LVH, mildly decreased RV systolic function.    On 05/05/20, he had a near-syncopal event and went to the ER, no events on ICD interrogation.  ?Orthostatic.  No further dizziness/lightheadedness.  Repeat RHC (2/22) showed optimized filling pressures, mild pulmonary hypertension, preserved cardiac output. Suspect elevated PA pressure may be related to OSA.    Admitted 04/4429  With A/C systolic heart failure. Diuresed with IV lasix and transitioned  to lasix 60 mg daily. Discharge weight 232 pounds.   09/10/20 he presented for scheduled cardiomems and was noted to be in A fib RVR. RHC with succesful cardiomems implant. Stable hemodynamics on cath. Admitted from cath lab with A fib RVR and started on amio drip. Chemically converted to NSR was transitioned to amio 200 mg bid.  Also incidentally found to be COVID +.   09/30/20, he was admitted w/ acute subcortical CVA. Unable to get cMRI given ICD. Neurology felt related to small vessel disease. Eliquis continued. He was d/c to [facility] for rehab.  Echo in 6/22 showed EF 35-40%.    He had atrial fibrillation ablation in 11/22.   Admit 3/23 for hypertensive urgency. Chest CT negative for dissection and PE. Found to be in acute CHF. Hospitalization c/b AKI felt due to CIN. SCr bumped to 4 (BL ~3) diuretics were briefly held and SCr showed downward trend. Antihypertensive regimen was increased further.   Diagnosed with COVID-19 in 5/23. Admitted overnight 05/10 with CP and elevated troponin. Suspected due to nausea/vomiting with hematemesis and dehydration. He was significantly hypertensive with SBP 190s. Had not been able to keep meds down.  Admitted with acute pancreatitis in 7/23, likely due to ETOH.   He returns for followup of CHF with sign language interpreter.  He still drinks some ETOH, will not tell me clearly how much.  Occasionally gets lightheaded with standing, BP 98/60 today.  Breathing doing better than in the past.  No dyspnea walking  on flat ground.  No chest pain.  No orthopnea/PND.  Weight down 4 lbs.  Patient is moving soon to [facility] for work.    Taking all medications as prescribed.  Cardiomems 24 on 11/21/21 Device interrogation. HL score 0, well below threshold  Labs (9/17): BNP 477, K 4.5 => 4.2, creatinine 0.92 => 1.1, BNP 1009, HIV negative, SPEP negative.  Labs (10/17): K 5.2, creatinine 1.10, hgb 12.5 Labs (03/28/2016): K 5.3 Creatinine 1.07  Labs (3/18): digoxin < 0.2,  K 4.1, creatinine 0.97 Labs (9/18): hgb 12.2, K 4, creatinine 0.99, BNP 139 Labs (1/19): hgb 11.2, K 4.8, creatinine 1.26 Labs (8/20): K 3.7, creatinine 1.69 Labs (5/21): K 5, creatinine 1.4, LDL 192 Labs (10/21): K 4.1, creatinine 1.77, LDL 180, TGs 138 Labs (11/21): K 4.5, creatinine 2.68 Labs (12/21): K 5.1, creatinine 2.25 Labs (04/30/20): K 4.7, creatinine 2.7 Labs (05/05/20): K 4.0, creatinine 2.15 Labs (05/11/20): K 4.2, creatinine 2.43, LDL 76, HDL 38, TGs 220 Labs (2/22): K 4.4, creatinine 2.69 Labs (2/22): SCr 2.56, GFR 29, K 3.9  Labs (8/22): K 5.0, creatinine 2.84, LDL 205, HDL 42, TGs 384 Labs (9/22): K 4.4, creatinine 3.06 Labs (1/23): K 4.5, creatinine 3.19 Labs (3/23): Hospital SCr 2.1>2.9>4.1>4.4>3.5>2.87>2.7 (felt CIN)  Labs (7/23): K 3.9, creatinine 2.5, BNP 107  ECG (personally reviewed): NSR, 1st degree AVB, LVH  PMH: 1. HTN 2. Diabetes 3. Hyperlipidemia 4. Deafness since age 76: Needs sign language interpreter.  5. Chronic systolic CHF: Nonischemic cardiomyopathy.  Diagnosis 10/16 in [place], [place] (admitted with acute systolic CHF).  SPEP and HIV negative.  [place].  - Echo (10/16): EF 15-20%, mild LVH, mild MR, normal RV size and systolic function.  - LHC (10/16): No significant CAD.  - CPX (12/17): Mild functional limitation.  - 04/2016 CMRI: EF 27%. RV normal. No evidence of infiltrative disease, myocarditis, or MI.  - Echo (5/18): EF 25-30%, mild LV dilation, mild LVH, normal RV size with mildly decreased - Echo (9/20): EF 30-35%, mild LVH, diffuse hypokinesis, normal RV size and systolic function.  - Echo (10/21): EF 45%, mild global hypokinesis, moderate LVH, mildly decreased RV systolic function.  - RHC (2/22): Optimized filling pressures, mild pulmonary hypertension, preserved cardiac output. Continue current diuretics.  Suspect elevated PA pressure may be related to OSA.   - Echo (6/22): EF 35-40%, global hypokinesis, normal RV.  - Cardiomems  placed  6. Depression. 7. Colitis episodes.  8. CKD stage 4  9. DVT/PE in 4/21: No obvious trigger.  - Recurrent PE in 10/21, had been off anticoagulation.  10. Atrial fibrillation: Paroxysmal.  - Atrial fibrillation ablation in 11/22.  11. Gastritis/esophagitis: 10/21.  12. ETOH abuse 13. OSA: Cpap 14. CVA 6/22 15. ETOH pancreatitis  SH: From [place] originally.  Lives in [place] until 2017, then moved to [place].  Nonsmoker.  Unemployed.  H/o ETOH abuse.    FH: No known cardiac disease.   ROS: All systems reviewed and negative except as per HPI.   Current Outpatient Medications  Medication Sig Dispense Refill   apixaban (ELIQUIS) 5 MG TABS tablet TAKE 1 TABLET(5 MG) BY MOUTH TWICE DAILY 60 tablet 6   atorvastatin (LIPITOR) 80 MG tablet Take 1 tablet (80 mg total) by mouth at bedtime. 30 tablet 6   carvedilol (COREG) 25 MG tablet Take 1 tablet (25 mg total) by mouth 2 (two) times daily. 60 tablet 6   cholecalciferol (VITAMIN D3) 25 MCG (1000 UNIT) tablet Take 1,000 Units by mouth daily.  dapagliflozin propanediol (FARXIGA) 10 MG TABS tablet Take 1 tablet (10 mg total) by mouth daily before breakfast. 30 tablet 6   Evolocumab (REPATHA SURECLICK) 767 MG/ML SOAJ Inject 140 mg into the skin every 14 (fourteen) days. 2 mL 11   fenofibrate 54 MG tablet Take 1 tablet (54 mg total) by mouth daily. 30 tablet 6   furosemide (LASIX) 80 MG tablet Take 1 tablet (80 mg total) by mouth every morning AND 0.5 tablets (40 mg total) every evening. 60 tablet 3   hydrALAZINE (APRESOLINE) 50 MG tablet Take 1 tablet (50 mg total) by mouth 3 (three) times daily. 260 tablet 3   insulin glargine (LANTUS SOLOSTAR) 100 UNIT/ML Solostar Pen Inject 10 Units into the skin at bedtime. 9 mL 3   Insulin Pen Needle (PEN NEEDLES) 31G X 6 MM MISC 1 pen by Does not apply route daily. 100 each 11   isosorbide mononitrate (IMDUR) 60 MG 24 hr tablet Take 1 tablet (60 mg total) by mouth daily. 30 tablet 6   LEVEMIR  FLEXPEN 100 UNIT/ML FlexPen Inject 35 Units into the skin at bedtime.     meclizine (ANTIVERT) 25 MG tablet Take 25 mg by mouth 3 (three) times daily as needed for dizziness.     ondansetron (ZOFRAN) 4 MG tablet Take 1 tablet (4 mg total) by mouth every 8 (eight) hours as needed for nausea or vomiting. 30 tablet 0   pantoprazole (PROTONIX) 40 MG tablet TAKE 1 TABLET (40 MG TOTAL) BY MOUTH 2 (TWO) TIMES DAILY (AM+BEDTIME) 180 tablet 0   sertraline (ZOLOFT) 50 MG tablet TAKE 1 TABLET (50 MG TOTAL) BY MOUTH DAILY (AM) 90 tablet 1   VASCEPA 1 g capsule Take 2 capsules (2 g total) by mouth 2 (two) times daily. 120 capsule 6   No current facility-administered medications for this encounter.   BP 98/60   Pulse 71   Wt 104.3 kg (230 lb)   SpO2 97%   BMI 33.00 kg/m    Wt Readings from Last 3 Encounters:  12/02/21 104.3 kg (230 lb)  12/01/21 103.4 kg (228 lb)  10/27/21 107 kg (236 lb)    PHYSICAL EXAM: General: NAD Neck: No JVD, no thyromegaly or thyroid nodule.  Lungs: Clear to auscultation bilaterally with normal respiratory effort. CV: Nondisplaced PMI.  Heart regular S1/S2, no S3/S4, no murmur.  No peripheral edema.  No carotid bruit.  Normal pedal pulses.  Abdomen: Soft, nontender, no hepatosplenomegaly, no distention.  Skin: Intact without lesions or rashes.  Neurologic: Alert and oriented x 3.  Psych: Normal affect. Extremities: No clubbing or cyanosis.  HEENT: Normal.   Assessment/Plan: 1. Chronic systolic CHF: Nonischemic cardiomyopathy, etiology uncertain. No CAD on 10/16 cath. Consider prior ETOH or HTN.  He has a Rogers.  cMRI in 1/18 with EF 27%, RV normal, no evidence of infiltrative disease, myocarditis, or MI. Echo in 9/20 showed EF 30-35%.  Echo in 10/21 showed EF up to 45% with mild RV dysfunction. Repeat RHC (2/22) with optimized filling pressures, preserved CO and moderate pulmonary hypertension-->likely due to untreated sleep apnea. Cardiomems placed 6/22.   Echo in 6/22 with EF 35-40%.  NYHA class II symptoms today.  HeartLogic score 0 though PADP 24 by Cardiomems about 10 days ago.  I do not think that he is significantly volume overloaded today.  CHF is complicated by CKD stage IV.  - Continue Lasix 80 qam/40 qpm. BMET today.  - Continue Farxiga 10 mg daily. - Continue  Coreg 25 mg bid.    - Continue hydralazine 50 mg tid + Imdur 60 mg daily.   - Off Entresto and spironolactone with elevated creatinine.  2. Type II diabetes: management per PCP. - On Farxiga. 3. HTN: BP now running on the low side.  - Stop amlodipine.  4. Deafness: Sign language interpreter present at visit.  5. CKD: Stage 4.  - BMET today.  - He is now followed by Nephrology. 6. PE/DVT: 4/21.  Unclear cause, no prolonged trip, prolonged immobility, or history of malignancy. No FH clotting. Recurrent PE in 10/21 while Eliquis on hold with bleeding from esophagitis/gastritis.  Do no think this was an Eliquis failure.  - Continue apixaban, think he will need long-term with no clear trigger for VTE + AFib. CBC today.  7. Hyperlipidemia: On atorvastatin, fenofibrate and Vascepa.  8. Atrial fibrillation: First noted in setting of DKA in 10/21.  Had been on amiodarone but discontinued 5/22. Had recurrent Afib 6/22 and amiodarone restarted. He had AF ablation in 11/22. No longer on amiodarone. RRR on exam today  - Continue Eliquis.  9. ETOH abuse: ETOH may have played a role in both cardiomyopathy and atrial fibrillation. Says he has cut back since pancreatitis episode but continues to drink socially.  - Cessation advised  10. Severe OSA: Using CPAP.  11. Noncompliance:  Previous medication noncompliance issues. - This has improved with HF Paramedicine program 12. CVA: 6/22, subcortical CVA. Unable to get cMRI given ICD. Neurology felt related to small vessel disease. No residual deficits - Continue Eliquis.   Follow-up: He is moving to [facility].  We will place a consult for him  with the heart failure clinic at [facility].  He will also need to establish with a nephrologist.   Loralie Champagne, MD 12/03/2021

## 2021-12-06 ENCOUNTER — Telehealth (HOSPITAL_COMMUNITY): Payer: Self-pay

## 2021-12-06 NOTE — Telephone Encounter (Signed)
Pt is now being d/c from paramedicine due to him moving to [place] this past wknd.   He was in clinic last Thursday and got info on providers to see down there.  I asked pharmacy to mail out his bubble packs until he gets things transitioned down there and meds moved over to local pharmacy there and gave pharmacy new address.   [name], [place] 12/06/2021

## 2021-12-09 ENCOUNTER — Telehealth (HOSPITAL_COMMUNITY): Payer: Self-pay

## 2021-12-09 NOTE — Telephone Encounter (Signed)
Patient moving to [facility], referral sent to [facility] advanced heart failure clinic on 12/09/2021 at 09.30am

## 2021-12-15 ENCOUNTER — Encounter: Payer: Self-pay | Admitting: Licensed Clinical Social Worker

## 2021-12-20 ENCOUNTER — Ambulatory Visit (INDEPENDENT_AMBULATORY_CARE_PROVIDER_SITE_OTHER): Payer: Medicare Other

## 2021-12-20 DIAGNOSIS — I5022 Chronic systolic (congestive) heart failure: Secondary | ICD-10-CM | POA: Diagnosis not present

## 2021-12-22 LAB — CUP PACEART REMOTE DEVICE CHECK
Battery Remaining Longevity: 132 mo
Battery Remaining Percentage: 94 %
Brady Statistic RV Percent Paced: 0 %
Date Time Interrogation Session: 20230911042400
HighPow Impedance: 64 Ohm
Implantable Lead Implant Date: 20180706
Implantable Lead Location: 753860
Implantable Lead Model: 293
Implantable Lead Serial Number: 432676
Implantable Pulse Generator Implant Date: 20180706
Lead Channel Impedance Value: 440 Ohm
Lead Channel Setting Pacing Amplitude: 2.5 V
Lead Channel Setting Pacing Pulse Width: 0.4 ms
Lead Channel Setting Sensing Sensitivity: 0.5 mV
Pulse Gen Serial Number: 236570

## 2022-01-06 NOTE — Progress Notes (Signed)
Remote ICD transmission.   

## 2022-01-28 ENCOUNTER — Telehealth (HOSPITAL_COMMUNITY): Payer: Self-pay | Admitting: Licensed Clinical Social Worker
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
## 2022-01-29 DIAGNOSIS — Z743 Need for continuous supervision: Secondary | ICD-10-CM | POA: Diagnosis not present

## 2022-01-29 DIAGNOSIS — R4589 Other symptoms and signs involving emotional state: Secondary | ICD-10-CM | POA: Diagnosis not present

## 2022-01-30 DIAGNOSIS — R45851 Suicidal ideations: Secondary | ICD-10-CM | POA: Diagnosis not present

## 2022-01-30 DIAGNOSIS — E785 Hyperlipidemia, unspecified: Secondary | ICD-10-CM | POA: Diagnosis not present

## 2022-01-30 DIAGNOSIS — E1122 Type 2 diabetes mellitus with diabetic chronic kidney disease: Secondary | ICD-10-CM | POA: Diagnosis not present

## 2022-01-30 DIAGNOSIS — I130 Hypertensive heart and chronic kidney disease with heart failure and stage 1 through stage 4 chronic kidney disease, or unspecified chronic kidney disease: Secondary | ICD-10-CM | POA: Diagnosis not present

## 2022-01-30 DIAGNOSIS — R808 Other proteinuria: Secondary | ICD-10-CM | POA: Diagnosis not present

## 2022-01-30 DIAGNOSIS — I509 Heart failure, unspecified: Secondary | ICD-10-CM | POA: Diagnosis not present

## 2022-01-30 DIAGNOSIS — I129 Hypertensive chronic kidney disease with stage 1 through stage 4 chronic kidney disease, or unspecified chronic kidney disease: Secondary | ICD-10-CM | POA: Diagnosis not present

## 2022-01-30 DIAGNOSIS — F331 Major depressive disorder, recurrent, moderate: Secondary | ICD-10-CM | POA: Diagnosis not present

## 2022-01-30 DIAGNOSIS — Z8673 Personal history of transient ischemic attack (TIA), and cerebral infarction without residual deficits: Secondary | ICD-10-CM | POA: Diagnosis not present

## 2022-01-30 DIAGNOSIS — N2889 Other specified disorders of kidney and ureter: Secondary | ICD-10-CM | POA: Diagnosis not present

## 2022-01-30 DIAGNOSIS — N179 Acute kidney failure, unspecified: Secondary | ICD-10-CM | POA: Diagnosis not present

## 2022-01-30 DIAGNOSIS — Z7901 Long term (current) use of anticoagulants: Secondary | ICD-10-CM | POA: Diagnosis not present

## 2022-01-30 DIAGNOSIS — Z5900 Homelessness unspecified: Secondary | ICD-10-CM | POA: Diagnosis not present

## 2022-01-30 DIAGNOSIS — K219 Gastro-esophageal reflux disease without esophagitis: Secondary | ICD-10-CM | POA: Diagnosis not present

## 2022-01-30 DIAGNOSIS — H9190 Unspecified hearing loss, unspecified ear: Secondary | ICD-10-CM | POA: Diagnosis not present

## 2022-01-30 DIAGNOSIS — E781 Pure hyperglyceridemia: Secondary | ICD-10-CM | POA: Diagnosis not present

## 2022-01-30 DIAGNOSIS — E1165 Type 2 diabetes mellitus with hyperglycemia: Secondary | ICD-10-CM | POA: Diagnosis not present

## 2022-01-30 DIAGNOSIS — I10 Essential (primary) hypertension: Secondary | ICD-10-CM | POA: Diagnosis not present

## 2022-01-30 DIAGNOSIS — Z95810 Presence of automatic (implantable) cardiac defibrillator: Secondary | ICD-10-CM | POA: Diagnosis not present

## 2022-01-30 DIAGNOSIS — F99 Mental disorder, not otherwise specified: Secondary | ICD-10-CM | POA: Diagnosis not present

## 2022-01-30 DIAGNOSIS — Z8679 Personal history of other diseases of the circulatory system: Secondary | ICD-10-CM | POA: Diagnosis not present

## 2022-01-30 DIAGNOSIS — N184 Chronic kidney disease, stage 4 (severe): Secondary | ICD-10-CM | POA: Diagnosis not present

## 2022-01-30 DIAGNOSIS — E119 Type 2 diabetes mellitus without complications: Secondary | ICD-10-CM | POA: Diagnosis not present

## 2022-01-30 DIAGNOSIS — R739 Hyperglycemia, unspecified: Secondary | ICD-10-CM | POA: Diagnosis not present

## 2022-01-30 DIAGNOSIS — Z794 Long term (current) use of insulin: Secondary | ICD-10-CM | POA: Diagnosis not present

## 2022-01-30 DIAGNOSIS — I160 Hypertensive urgency: Secondary | ICD-10-CM | POA: Diagnosis not present

## 2022-03-05 DIAGNOSIS — I4891 Unspecified atrial fibrillation: Secondary | ICD-10-CM | POA: Diagnosis not present

## 2022-03-05 DIAGNOSIS — N189 Chronic kidney disease, unspecified: Secondary | ICD-10-CM | POA: Diagnosis not present

## 2022-03-05 DIAGNOSIS — Z7984 Long term (current) use of oral hypoglycemic drugs: Secondary | ICD-10-CM | POA: Diagnosis not present

## 2022-03-05 DIAGNOSIS — D631 Anemia in chronic kidney disease: Secondary | ICD-10-CM | POA: Diagnosis not present

## 2022-03-05 DIAGNOSIS — Z95810 Presence of automatic (implantable) cardiac defibrillator: Secondary | ICD-10-CM | POA: Diagnosis not present

## 2022-03-05 DIAGNOSIS — I129 Hypertensive chronic kidney disease with stage 1 through stage 4 chronic kidney disease, or unspecified chronic kidney disease: Secondary | ICD-10-CM | POA: Diagnosis not present

## 2022-03-05 DIAGNOSIS — Z1152 Encounter for screening for COVID-19: Secondary | ICD-10-CM | POA: Diagnosis not present

## 2022-03-05 DIAGNOSIS — D649 Anemia, unspecified: Secondary | ICD-10-CM | POA: Diagnosis not present

## 2022-03-05 DIAGNOSIS — I130 Hypertensive heart and chronic kidney disease with heart failure and stage 1 through stage 4 chronic kidney disease, or unspecified chronic kidney disease: Secondary | ICD-10-CM | POA: Diagnosis not present

## 2022-03-05 DIAGNOSIS — I517 Cardiomegaly: Secondary | ICD-10-CM | POA: Diagnosis not present

## 2022-03-05 DIAGNOSIS — E1165 Type 2 diabetes mellitus with hyperglycemia: Secondary | ICD-10-CM | POA: Diagnosis not present

## 2022-03-05 DIAGNOSIS — Z7901 Long term (current) use of anticoagulants: Secondary | ICD-10-CM | POA: Diagnosis not present

## 2022-03-05 DIAGNOSIS — J069 Acute upper respiratory infection, unspecified: Secondary | ICD-10-CM | POA: Diagnosis not present

## 2022-03-05 DIAGNOSIS — H9190 Unspecified hearing loss, unspecified ear: Secondary | ICD-10-CM | POA: Diagnosis not present

## 2022-03-05 DIAGNOSIS — G4733 Obstructive sleep apnea (adult) (pediatric): Secondary | ICD-10-CM | POA: Diagnosis not present

## 2022-03-05 DIAGNOSIS — E1122 Type 2 diabetes mellitus with diabetic chronic kidney disease: Secondary | ICD-10-CM | POA: Diagnosis not present

## 2022-03-05 DIAGNOSIS — I428 Other cardiomyopathies: Secondary | ICD-10-CM | POA: Diagnosis not present

## 2022-03-05 DIAGNOSIS — E871 Hypo-osmolality and hyponatremia: Secondary | ICD-10-CM | POA: Diagnosis not present

## 2022-03-05 DIAGNOSIS — I5022 Chronic systolic (congestive) heart failure: Secondary | ICD-10-CM | POA: Diagnosis not present

## 2022-03-05 DIAGNOSIS — K859 Acute pancreatitis without necrosis or infection, unspecified: Secondary | ICD-10-CM | POA: Diagnosis not present

## 2022-03-05 DIAGNOSIS — E785 Hyperlipidemia, unspecified: Secondary | ICD-10-CM | POA: Diagnosis not present

## 2022-03-05 DIAGNOSIS — R918 Other nonspecific abnormal finding of lung field: Secondary | ICD-10-CM | POA: Diagnosis not present

## 2022-03-05 DIAGNOSIS — I160 Hypertensive urgency: Secondary | ICD-10-CM | POA: Diagnosis not present

## 2022-03-05 DIAGNOSIS — I509 Heart failure, unspecified: Secondary | ICD-10-CM | POA: Diagnosis not present

## 2022-03-05 DIAGNOSIS — N184 Chronic kidney disease, stage 4 (severe): Secondary | ICD-10-CM | POA: Diagnosis not present

## 2022-03-05 DIAGNOSIS — R079 Chest pain, unspecified: Secondary | ICD-10-CM | POA: Diagnosis not present

## 2022-03-05 DIAGNOSIS — I110 Hypertensive heart disease with heart failure: Secondary | ICD-10-CM | POA: Diagnosis not present

## 2022-03-08 ENCOUNTER — Other Ambulatory Visit: Payer: Self-pay | Admitting: Nurse Practitioner

## 2022-03-11 DIAGNOSIS — I5020 Unspecified systolic (congestive) heart failure: Secondary | ICD-10-CM | POA: Diagnosis not present

## 2022-03-18 IMAGING — CT CT ABD-PELV W/O CM
2 of 4 series · 16 of 46 positions shown, 18 images · non-contrast
Comparison: August 19, 2019

CLINICAL DATA: Abdominal pain and vomiting

EXAM:
CT ABDOMEN AND PELVIS WITHOUT CONTRAST
TECHNIQUE: Multidetector CT imaging of the abdomen and pelvis was performed
following the standard protocol without IV contrast.

[Series 2: axial st · axial · 0.85mm/px · z∈[-476,-21]mm · 13 of 103 slices shown, 15 images]
[im 6/103  soft-tissue]
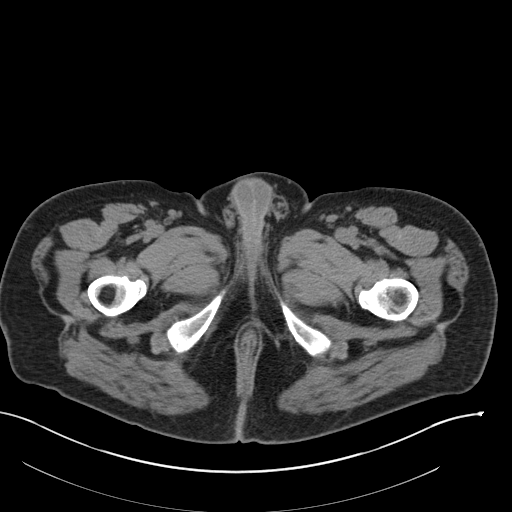
[im 6/103  bone]
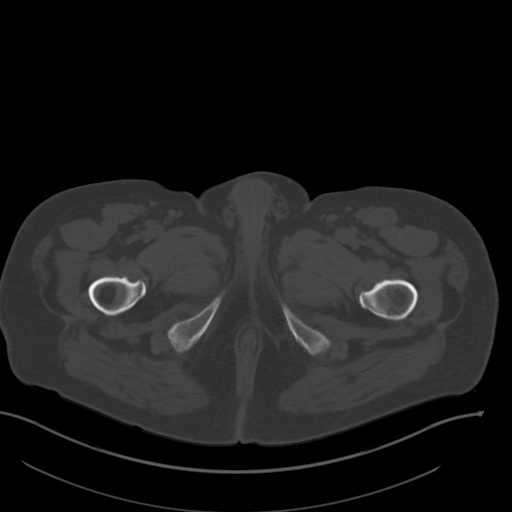
[im 12/103  soft-tissue]
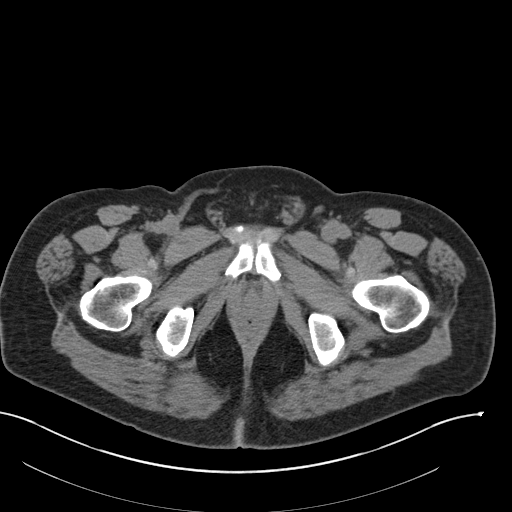
[im 23/103  soft-tissue]
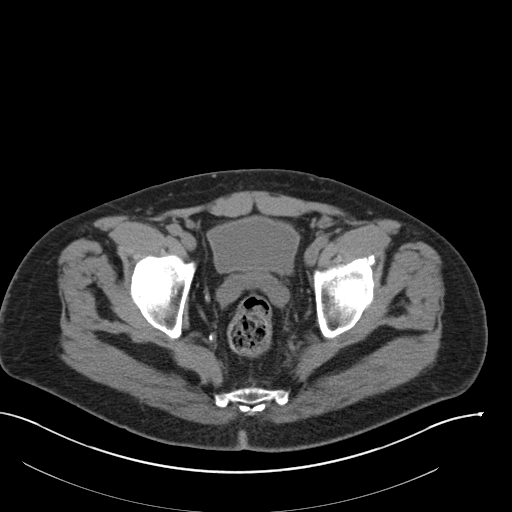
[im 29/103  soft-tissue]
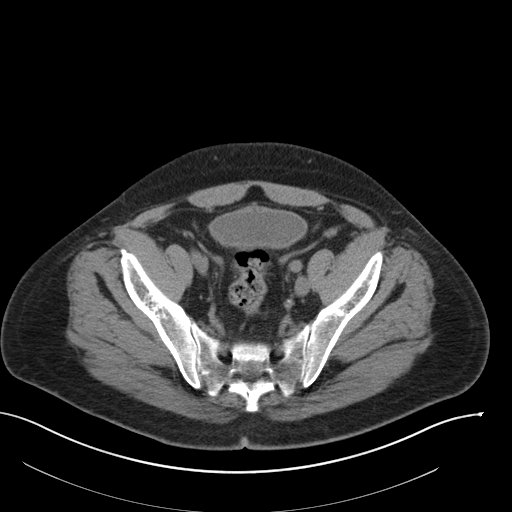
[im 35/103  soft-tissue]
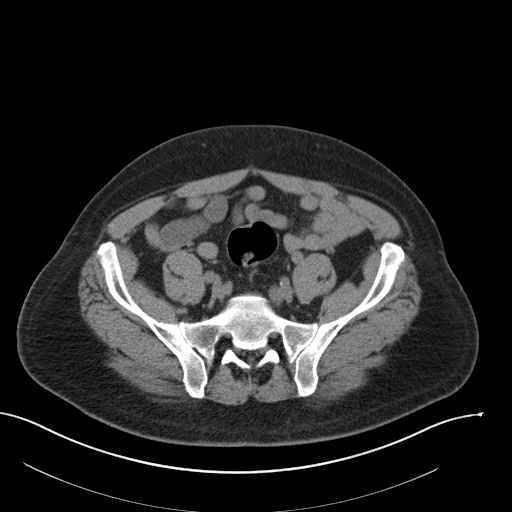
[im 46/103  soft-tissue]
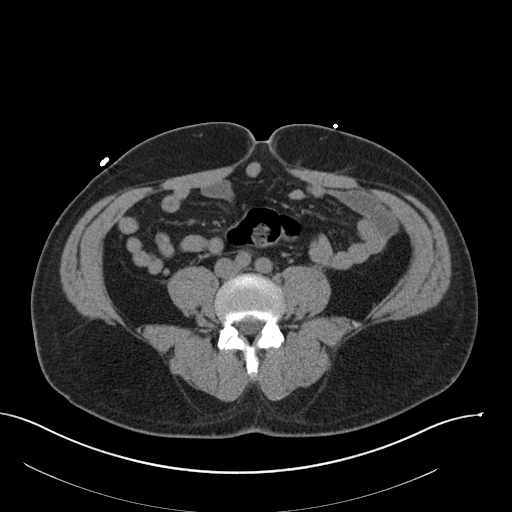
[im 52/103  soft-tissue]
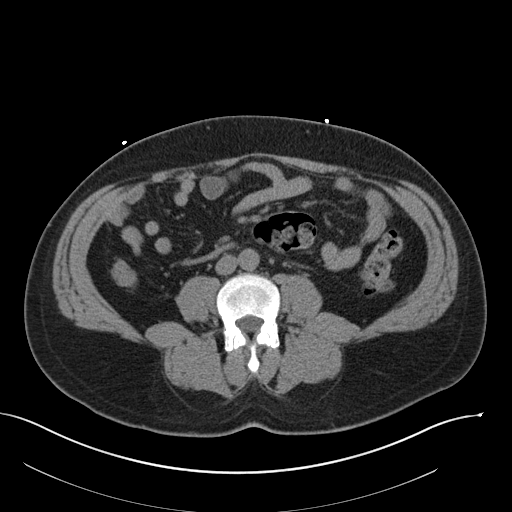
[im 57/103  soft-tissue]
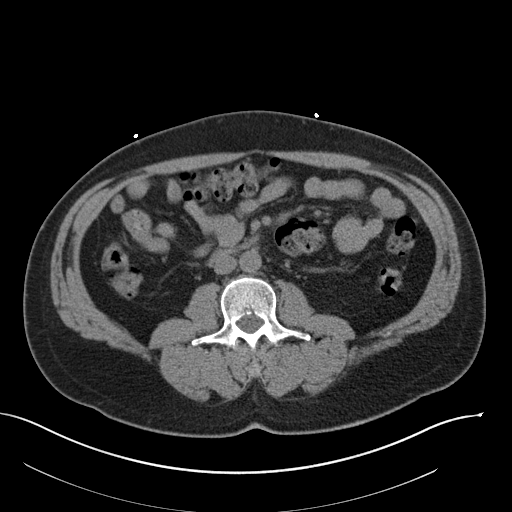
[im 69/103  soft-tissue]
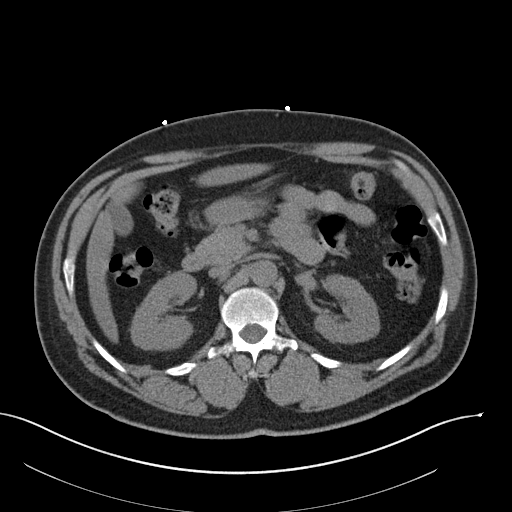
[im 69/103  bone]
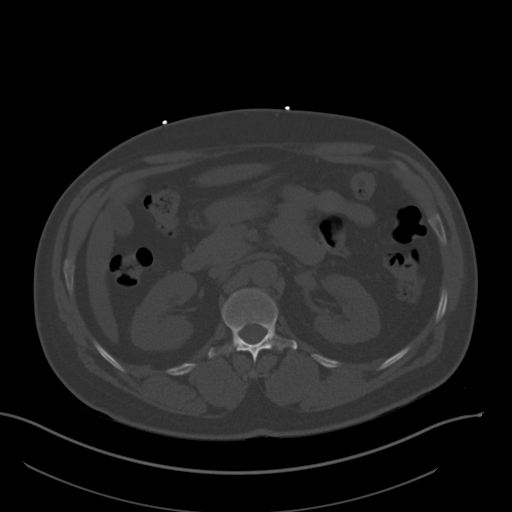
[im 74/103  soft-tissue]
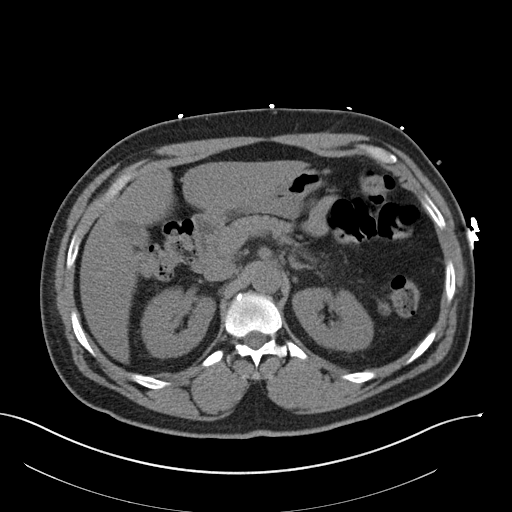
[im 80/103  soft-tissue]
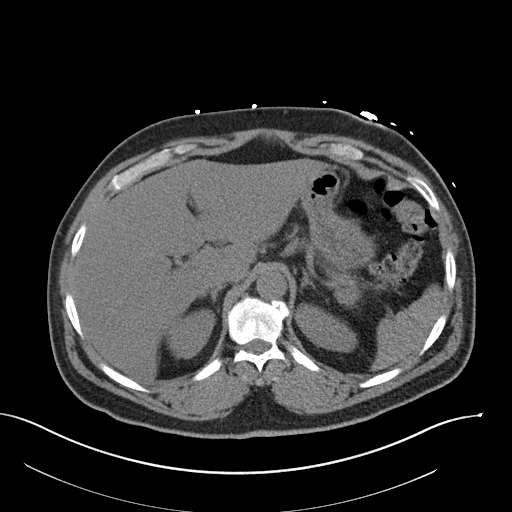
[im 91/103  soft-tissue]
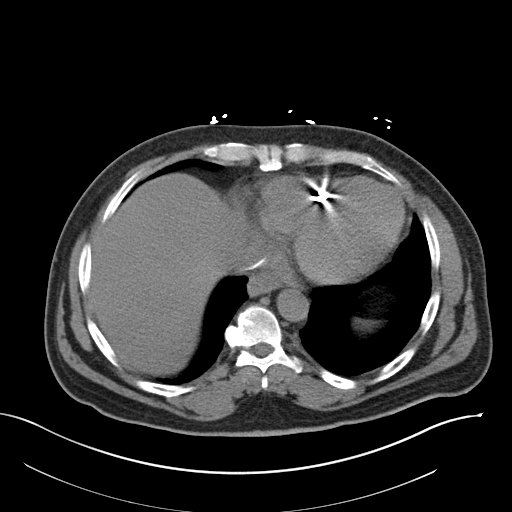
[im 97/103  soft-tissue]
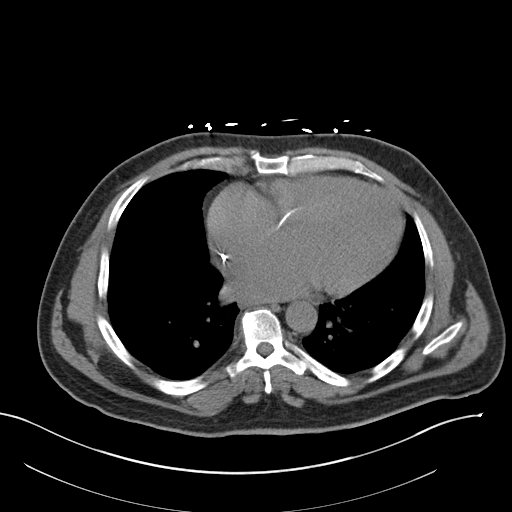

[Series 5: coronal st · coronal · 0.84mm/px · 3 of 154 slices shown]
[im 52/154  soft-tissue]
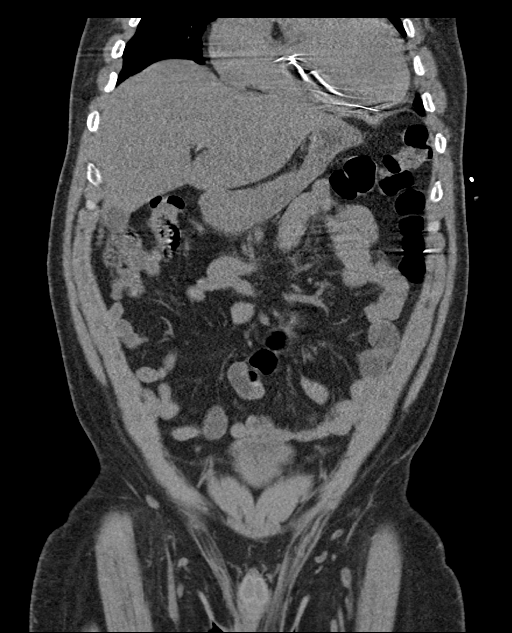
[im 69/154  soft-tissue]
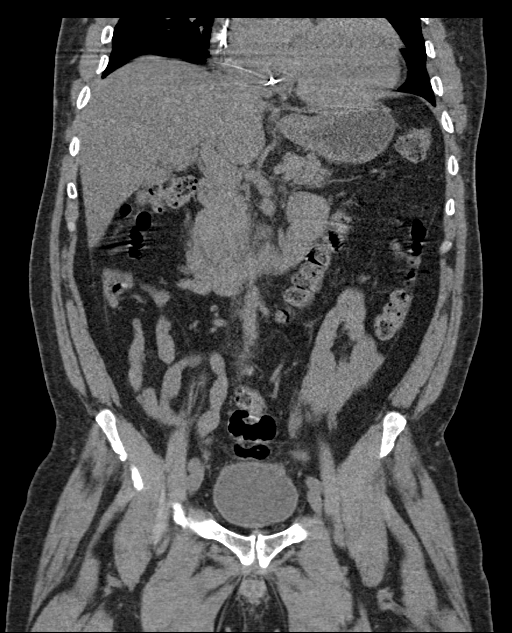
[im 86/154  soft-tissue]
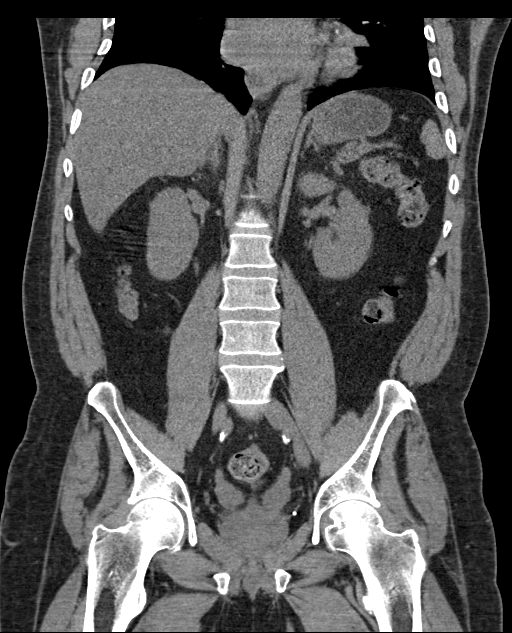

[16 of 46 positions shown; findings below may reference images not displayed]

FINDINGS: Lower chest: There is mild cardiomegaly present.

No hiatal hernia.

The visualized portions of the lungs are clear.

Hepatobiliary: Although limited due to the lack of intravenous
contrast, normal in appearance without gross focal abnormality. No
evidence of calcified gallstones or biliary ductal dilatation.

Pancreas:  Unremarkable.  No surrounding inflammatory changes.

Spleen: Normal in size. Although limited due to the lack of
intravenous contrast, normal in appearance.

Adrenals/Urinary Tract: Both adrenal glands appear normal. The
kidneys and collecting system appear normal without evidence of
urinary tract calculus or hydronephrosis. Bladder is unremarkable.

Stomach/Bowel: The stomach, small bowel, and colon are normal in
appearance. No inflammatory changes or obstructive findings. A
moderate amount of colonic stool is present. Appendix is normal.

Vascular/Lymphatic: There are no enlarged abdominal or pelvic lymph
nodes. Scattered mild aortic atherosclerotic calcifications are seen
without aneurysmal dilatation.

Reproductive: The prostate is unremarkable.

Other: No evidence of abdominal wall mass or hernia.

Musculoskeletal: No acute or significant osseous findings.
IMPRESSION: No acute intra-abdominal or pelvic pathology to explain the
patient's symptoms.

Moderate amount of colonic stool

Aortic Atherosclerosis ([id number]).

## 2022-03-21 DIAGNOSIS — E1122 Type 2 diabetes mellitus with diabetic chronic kidney disease: Secondary | ICD-10-CM | POA: Diagnosis not present

## 2022-03-21 DIAGNOSIS — E669 Obesity, unspecified: Secondary | ICD-10-CM | POA: Diagnosis not present

## 2022-03-21 DIAGNOSIS — N1832 Chronic kidney disease, stage 3b: Secondary | ICD-10-CM | POA: Diagnosis not present

## 2022-03-21 DIAGNOSIS — I5022 Chronic systolic (congestive) heart failure: Secondary | ICD-10-CM | POA: Diagnosis not present

## 2022-03-21 DIAGNOSIS — Z794 Long term (current) use of insulin: Secondary | ICD-10-CM | POA: Diagnosis not present

## 2022-03-21 DIAGNOSIS — G4733 Obstructive sleep apnea (adult) (pediatric): Secondary | ICD-10-CM | POA: Diagnosis not present

## 2022-03-21 DIAGNOSIS — Z6833 Body mass index (BMI) 33.0-33.9, adult: Secondary | ICD-10-CM | POA: Diagnosis not present

## 2022-03-21 DIAGNOSIS — E785 Hyperlipidemia, unspecified: Secondary | ICD-10-CM | POA: Diagnosis not present

## 2022-03-21 DIAGNOSIS — I130 Hypertensive heart and chronic kidney disease with heart failure and stage 1 through stage 4 chronic kidney disease, or unspecified chronic kidney disease: Secondary | ICD-10-CM | POA: Diagnosis not present

## 2022-03-21 DIAGNOSIS — I4891 Unspecified atrial fibrillation: Secondary | ICD-10-CM | POA: Diagnosis not present

## 2022-05-19 ENCOUNTER — Encounter (HOSPITAL_COMMUNITY): Payer: Self-pay | Admitting: *Deleted

## 2022-08-08 ENCOUNTER — Ambulatory Visit: Payer: Self-pay | Admitting: Licensed Clinical Social Worker

## 2022-08-08 NOTE — Patient Instructions (Signed)
Visit Information  Thank you for taking time to visit with me today. Please don't hesitate to contact me if I can be of assistance to you.   Following are the goals we discussed today:   Goals Addressed             This Visit's Progress    LCSW communicated today with Kathyrn Drown, contact and friend for client. Kathyrn Drown reported today to LCSW that client had died in past 2 weeks       Interventions:  LCSW had completed chart review for client.  LCSW called Kathyrn Drown, contact and friend of client and spoke with Kathyrn Drown via phone today Kathyrn Drown reported to LCSW that client had moved recently to [place] to attend a school for the [employer]. Sadly, Kathyrn Drown reported to [employer] today that client had died in the past two weeks LCSW expressed sympathy to Kathyrn Drown regarding the recent death of client Kathyrn Drown thanked LCSW for phone call        No further interventions are needed.   Please call the care guide team at [phone] if you need to cancel or reschedule your appointment.   If you are experiencing a Mental Health or Behavioral Health Crisis or need someone to talk to, please go to [facility] [address], [place] [phone])   The patient/ Kathyrn Drown, contact and friend of client,  verbalized understanding of instructions, educational materials, and care plan provided today and DECLINED offer to receive copy of patient instructions, educational materials, and care plan.   The patient / Kathyrn Drown, contact and friend of client, has been provided with contact information for the care management team and has been advised to call with any health related questions or concerns.   Kelton Pillar.Calandria Mullings MSW, LCSW Licensed Visual merchandiser [facility] Care Management [phone]

## 2022-08-08 NOTE — Patient Outreach (Signed)
  Care Coordination   Follow Up Visit Note   08/08/2022 Name: Troy Rush MRN: [id number] DOB: 02-17-1971  Troy Rush is a 52 y.o. year old male who sees [place], Enid Derry I, NP (Inactive) for primary care. I spoke with  Troy Rush / Troy Rush, contact and friend of client, via phone today.  What matters to the patients health and wellness today? Sadly, client died in past two weeks    Goals Addressed             This Visit's Progress    LCSW communicated today with Troy Rush, contact and friend for client. Troy Rush reported today to LCSW that client had died in past 2 weeks       Interventions:  LCSW had completed chart review for client.  LCSW called Troy Rush, contact and friend of client and spoke with Troy Rush via phone today Troy Rush reported to LCSW that client had moved recently to [place] to attend a school for the [employer]. Sadly, Troy Rush reported to [employer] today that client had died in the past two weeks LCSW expressed sympathy to Troy Rush regarding the recent death of client Troy Rush thanked LCSW for phone call         SDOH assessments and interventions completed:  No     Care Coordination Interventions:  No, not indicated  Interventions Today    Flowsheet Row Most Recent Value  Chronic Disease   Chronic disease during today's visit Other  [LCSW spoke with Troy Rush, contact and friend, about client status]  General Interventions   General Interventions Discussed/Reviewed General Interventions Discussed, [employer] communicated today with LCSW that client had moved to [place] recently. Troy Rush communicated with LCSW today that client had died in past two weeks. LCSW expressed symptathy to Mr. Early Chars regarding death of client]        Follow up plan: No further intervention required.   Encounter Outcome:  Pt. Visit Completed   Kelton Pillar.Cigi Bega MSW, LCSW Licensed Visual merchandiser [facility]  Care Management [phone]
